# Patient Record
Sex: Female | Born: 1970
Health system: Southern US, Community
[De-identification: ages and names within clinical notes are randomized; demographics above are authoritative.]

## PROBLEM LIST (undated history)

## (undated) DIAGNOSIS — Z8042 Family history of malignant neoplasm of prostate: Secondary | ICD-10-CM

## (undated) DIAGNOSIS — C50919 Malignant neoplasm of unspecified site of unspecified female breast: Secondary | ICD-10-CM

## (undated) DIAGNOSIS — J189 Pneumonia, unspecified organism: Secondary | ICD-10-CM

## (undated) DIAGNOSIS — Z803 Family history of malignant neoplasm of breast: Secondary | ICD-10-CM

## (undated) DIAGNOSIS — K219 Gastro-esophageal reflux disease without esophagitis: Secondary | ICD-10-CM

## (undated) DIAGNOSIS — I10 Essential (primary) hypertension: Secondary | ICD-10-CM

## (undated) DIAGNOSIS — T7840XA Allergy, unspecified, initial encounter: Secondary | ICD-10-CM

## (undated) DIAGNOSIS — I509 Heart failure, unspecified: Secondary | ICD-10-CM

## (undated) DIAGNOSIS — Z923 Personal history of irradiation: Secondary | ICD-10-CM

## (undated) HISTORY — PX: ESSURE TUBAL LIGATION: SUR464

## (undated) HISTORY — PX: CHOLECYSTECTOMY: SHX55

## (undated) HISTORY — DX: Malignant neoplasm of unspecified site of unspecified female breast: C50.919

## (undated) HISTORY — PX: WISDOM TOOTH EXTRACTION: SHX21

## (undated) HISTORY — DX: Family history of malignant neoplasm of prostate: Z80.42

## (undated) HISTORY — DX: Family history of malignant neoplasm of breast: Z80.3

## (undated) HISTORY — DX: Essential (primary) hypertension: I10

## (undated) HISTORY — DX: Allergy, unspecified, initial encounter: T78.40XA

## (undated) HISTORY — PX: BREAST SURGERY: SHX581

---

## 1998-04-09 ENCOUNTER — Emergency Department (HOSPITAL_COMMUNITY): Admission: EM | Admit: 1998-04-09 | Discharge: 1998-04-09 | Payer: Self-pay | Admitting: Emergency Medicine

## 2008-06-05 ENCOUNTER — Emergency Department (HOSPITAL_BASED_OUTPATIENT_CLINIC_OR_DEPARTMENT_OTHER): Admission: EM | Admit: 2008-06-05 | Discharge: 2008-06-05 | Payer: Self-pay | Admitting: Emergency Medicine

## 2011-07-03 ENCOUNTER — Emergency Department (INDEPENDENT_AMBULATORY_CARE_PROVIDER_SITE_OTHER): Payer: No Typology Code available for payment source

## 2011-07-03 ENCOUNTER — Emergency Department (HOSPITAL_BASED_OUTPATIENT_CLINIC_OR_DEPARTMENT_OTHER)
Admission: EM | Admit: 2011-07-03 | Discharge: 2011-07-03 | Disposition: A | Discharge status: Home / Self Care | Payer: No Typology Code available for payment source | Attending: Emergency Medicine | Admitting: Emergency Medicine

## 2011-07-03 ENCOUNTER — Encounter: Payer: Self-pay | Admitting: *Deleted

## 2011-07-03 DIAGNOSIS — S8000XA Contusion of unspecified knee, initial encounter: Secondary | ICD-10-CM | POA: Insufficient documentation

## 2011-07-03 DIAGNOSIS — IMO0001 Reserved for inherently not codable concepts without codable children: Secondary | ICD-10-CM | POA: Insufficient documentation

## 2011-07-03 DIAGNOSIS — S8002XA Contusion of left knee, initial encounter: Secondary | ICD-10-CM

## 2011-07-03 DIAGNOSIS — M25569 Pain in unspecified knee: Secondary | ICD-10-CM

## 2011-07-03 DIAGNOSIS — M4120 Other idiopathic scoliosis, site unspecified: Secondary | ICD-10-CM

## 2011-07-03 DIAGNOSIS — S39012A Strain of muscle, fascia and tendon of lower back, initial encounter: Secondary | ICD-10-CM

## 2011-07-03 DIAGNOSIS — S335XXA Sprain of ligaments of lumbar spine, initial encounter: Secondary | ICD-10-CM | POA: Insufficient documentation

## 2011-07-03 DIAGNOSIS — M545 Low back pain: Secondary | ICD-10-CM

## 2011-07-03 DIAGNOSIS — Y92410 Unspecified street and highway as the place of occurrence of the external cause: Secondary | ICD-10-CM | POA: Insufficient documentation

## 2011-07-03 MED ORDER — METHOCARBAMOL 500 MG PO TABS: 500.0000 mg | ORAL_TABLET | Freq: Two times a day (BID) | ORAL | Status: AC

## 2011-07-03 MED ORDER — KETOROLAC TROMETHAMINE 60 MG/2ML IM SOLN
60.0000 mg | Freq: Once | INTRAMUSCULAR | Status: AC
  Administered 2011-07-03: 60 mg via INTRAMUSCULAR
  Filled 2011-07-03: qty 2

## 2011-07-03 MED ORDER — IBUPROFEN 800 MG PO TABS: 800.0000 mg | ORAL_TABLET | Freq: Three times a day (TID) | ORAL | Status: AC

## 2011-07-03 MED ORDER — HYDROCODONE-ACETAMINOPHEN 5-500 MG PO TABS: 2.0000 | ORAL_TABLET | Freq: Four times a day (QID) | ORAL | Status: AC | PRN
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
## 2011-07-03 NOTE — ED Provider Notes (Signed)
Medical screening examination/treatment/procedure(s) were performed by non-physician practitioner and as supervising physician I was immediately available for consultation/collaboration.   Gussie Towson M Nil Bolser, MD 07/03/11 2312 
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
## 2014-07-06 ENCOUNTER — Other Ambulatory Visit: Payer: Self-pay | Admitting: Radiology

## 2014-07-06 DIAGNOSIS — C50911 Malignant neoplasm of unspecified site of right female breast: Secondary | ICD-10-CM

## 2014-07-07 ENCOUNTER — Telehealth: Payer: Self-pay | Admitting: *Deleted

## 2014-07-07 DIAGNOSIS — C50311 Malignant neoplasm of lower-inner quadrant of right female breast: Secondary | ICD-10-CM

## 2014-07-07 NOTE — Telephone Encounter (Signed)
Confirmed [facility] for 07/14/14 at 1230pm .  Instructions and contact information given.

## 2014-07-13 NOTE — Telephone Encounter (Signed)
Error

## 2014-07-14 ENCOUNTER — Other Ambulatory Visit (INDEPENDENT_AMBULATORY_CARE_PROVIDER_SITE_OTHER): Payer: Self-pay | Admitting: General Surgery

## 2014-07-14 ENCOUNTER — Encounter: Payer: Self-pay | Admitting: Hematology

## 2014-07-14 ENCOUNTER — Ambulatory Visit: Payer: Self-pay

## 2014-07-14 ENCOUNTER — Other Ambulatory Visit: Payer: Self-pay | Admitting: *Deleted

## 2014-07-14 ENCOUNTER — Other Ambulatory Visit (HOSPITAL_BASED_OUTPATIENT_CLINIC_OR_DEPARTMENT_OTHER): Payer: BC Managed Care – PPO

## 2014-07-14 ENCOUNTER — Encounter (INDEPENDENT_AMBULATORY_CARE_PROVIDER_SITE_OTHER): Payer: Self-pay

## 2014-07-14 ENCOUNTER — Ambulatory Visit: Payer: BC Managed Care – PPO | Attending: General Surgery | Admitting: Physical Therapy

## 2014-07-14 ENCOUNTER — Encounter: Payer: Self-pay | Admitting: General Practice

## 2014-07-14 ENCOUNTER — Ambulatory Visit (HOSPITAL_BASED_OUTPATIENT_CLINIC_OR_DEPARTMENT_OTHER): Payer: BC Managed Care – PPO | Admitting: Hematology

## 2014-07-14 ENCOUNTER — Ambulatory Visit
Admission: RE | Admit: 2014-07-14 | Discharge: 2014-07-14 | Disposition: A | Discharge status: Home / Self Care | Payer: No Typology Code available for payment source | Source: Ambulatory Visit | Attending: Radiation Oncology | Admitting: Radiation Oncology

## 2014-07-14 VITALS — BP 158/84 | HR 80 | Temp 98.9°F | Resp 18 | Ht 70.0 in | Wt 215.9 lb

## 2014-07-14 DIAGNOSIS — C773 Secondary and unspecified malignant neoplasm of axilla and upper limb lymph nodes: Secondary | ICD-10-CM

## 2014-07-14 DIAGNOSIS — C50311 Malignant neoplasm of lower-inner quadrant of right female breast: Secondary | ICD-10-CM

## 2014-07-14 DIAGNOSIS — Z171 Estrogen receptor negative status [ER-]: Secondary | ICD-10-CM

## 2014-07-14 DIAGNOSIS — C50911 Malignant neoplasm of unspecified site of right female breast: Secondary | ICD-10-CM

## 2014-07-14 LAB — COMPREHENSIVE METABOLIC PANEL (CC13)
ALBUMIN: 3.8 g/dL (ref 3.5–5.0)
ALK PHOS: 79 U/L (ref 40–150)
ALT: 21 U/L (ref 0–55)
ANION GAP: 10 meq/L (ref 3–11)
AST: 23 U/L (ref 5–34)
BILIRUBIN TOTAL: 0.36 mg/dL (ref 0.20–1.20)
BUN: 10.9 mg/dL (ref 7.0–26.0)
CO2: 26 mEq/L (ref 22–29)
CREATININE: 0.9 mg/dL (ref 0.6–1.1)
Calcium: 9.9 mg/dL (ref 8.4–10.4)
Chloride: 103 mEq/L (ref 98–109)
GLUCOSE: 73 mg/dL (ref 70–140)
POTASSIUM: 4 meq/L (ref 3.5–5.1)
SODIUM: 139 meq/L (ref 136–145)
TOTAL PROTEIN: 8.4 g/dL — AB (ref 6.4–8.3)

## 2014-07-14 LAB — CBC WITH DIFFERENTIAL/PLATELET
BASO%: 0.2 % (ref 0.0–2.0)
BASOS ABS: 0 10*3/uL (ref 0.0–0.1)
EOS%: 0.9 % (ref 0.0–7.0)
Eosinophils Absolute: 0.1 10*3/uL (ref 0.0–0.5)
HEMATOCRIT: 40.8 % (ref 34.8–46.6)
HGB: 13.5 g/dL (ref 11.6–15.9)
LYMPH#: 3.1 10*3/uL (ref 0.9–3.3)
LYMPH%: 30.2 % (ref 14.0–49.7)
MCH: 28.7 pg (ref 25.1–34.0)
MCHC: 33.1 g/dL (ref 31.5–36.0)
MCV: 86.6 fL (ref 79.5–101.0)
MONO#: 0.8 10*3/uL (ref 0.1–0.9)
MONO%: 8.2 % (ref 0.0–14.0)
NEUT%: 60.5 % (ref 38.4–76.8)
NEUTROS ABS: 6.2 10*3/uL (ref 1.5–6.5)
Platelets: 263 10*3/uL (ref 145–400)
RBC: 4.71 10*6/uL (ref 3.70–5.45)
RDW: 12.9 % (ref 11.2–14.5)
WBC: 10.2 10*3/uL (ref 3.9–10.3)

## 2014-07-14 NOTE — Progress Notes (Signed)
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 female who is seen today at the [facility] through the courtesy of Dr. Barry Dienes for evaluation of her clinical stage T3 N2 MX invasive ductal carcinoma the right breast.  This past March she noted posterior shoulder discomfort.  This did not entirely go away.  2 months ago she noted a right axillary mass, and then soon after a right breast mass.  2 weeks ago she had a right shoulder x-ray at an imaging center.  There was a rounded density along the right lung concerning for malignancy.  It is unclear if this was possibly a nodal mass projected over the right lung apex.  She was seen at [facility] on 07/02/2014 where she underwent mammography which noted a similar mass at 4:00 within the right breast and also a 1.8 cm in largest node within the right axillary tail.  Ultrasound showed at least a 5 cm mass at the 4:00 position within the right breast and also a 5 cm nodal mass within the right axillary tail.  Ultrasound-guided biopsies on 07/05/2014 were diagnostic for invasive ductal carcinoma/DCIS from the right breast mass and also from a right axillary node/mass biopsy.  I understand that the tumor was ER/PR negative and HER-2/neu positive (verbal report).  Ki-67 is 80%.  She is scheduled for a MRI scan of her breasts this Friday.  She does have limited range of motion of her right shoulder.  She describes pain radiating to the right deltoid but not distal.  She seen today with Dr. Burr Medico and Dr. Stark Klein.  PREVIOUS RADIATION THERAPY: No   PAST MEDICAL HISTORY:  has a past  medical history of Breast cancer and Anxiety.     PAST SURGICAL HISTORY:  Past Surgical History  Procedure Laterality Date  . Cesarean section    . Essure tubal ligation    . Cholecystectomy       FAMILY HISTORY: family history includes Breast cancer in her maternal aunt and mother; Liver cancer in her father and paternal grandmother; Prostate cancer in her maternal grandfather and paternal grandfather. Her mother was diagnosed with breast cancer at age 4 and a maternal aunt was diagnosed at age 22.  Both the breast cancer survivors.  Her mother is currently 75.  Her father died from "liver cancer" in his 47s.   SOCIAL HISTORY:  reports that she has never smoked. She does not have any smokeless tobacco history on file. She reports that she drinks alcohol. She reports that she does not use illicit drugs. Married, 2 children, daughter age 38 and son age 10.   ALLERGIES: Aspirin and Codeine   MEDICATIONS:  Current Outpatient Prescriptions  Medication Sig Dispense Refill  . Multiple Vitamin (MULTIVITAMIN) tablet Take 1 tablet by mouth daily.       No current facility-administered medications for this encounter.     REVIEW OF SYSTEMS:  Pertinent items are noted in HPI.    PHYSICAL EXAM: Alert and oriented 43 year old black female appearing her stated age. Wt Readings from Last  3 Encounters:  07/14/14 215 lb 14.4 oz (97.932 kg)  07/03/11 190 lb (86.183 kg)   Temp Readings from Last 3 Encounters:  07/14/14 98.9 F (37.2 C) Oral  07/03/11 98.8 F (37.1 C) Oral   BP Readings from Last 3 Encounters:  07/14/14 158/84  07/03/11 154/85   Pulse Readings from Last 3 Encounters:  07/14/14 80  07/03/11 77   Head and neck examination: Grossly unremarkable.  Nodes: There is no palpable cervical or supraclavicular lymphadenopathy.  There is a 5 cm partially fixed mass within the right axilla.  Chest: Lungs clear.  Breasts: There is a mobile 5 cm mass at 4:00 along the lower inner  quadrant of the right breast.  Left breast without masses or lesions.  Extremities: Without edema.  There is limited right shoulder extension secondary to pain.    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  His she will have a MRI scan this Friday and then a PET scan to complete her staging workup.  I'm obviously concerned about the possibility of stage IV disease based on her right shoulder x-ray history.  She may have direct extension into the brachial plexus from axillary adenopathy.  I wonder if the mass seen on her x-ray is adenopathy superimposed over the upper right lung.  If she has only local regional disease then she will receive neoadjuvant chemotherapy followed by surgery and then radiation therapy with curative intent..  We talked in very general terms about the role of surgery and radiation therapy in the setting of local regional disease versus metastatic disease where chemotherapy plays the prominent role.  She will also undergo genetic counseling/testing.  Dr. Feng will initiate chemotherapy in the very near future.   PLAN: As discussed above.  I spent 30 minutes minutes face to face with the patient and more than 50% of that time was spent in counseling and/or coordination of care.          

## 2014-07-14 NOTE — Progress Notes (Signed)
[name] and her husband briefly to provide [name] resources prior to their rapid departure to pick up their 45 year old son.  The patient scored a 6 on the Psychosocial Distress Thermometer which indicates moderate distress.  ONCBCN DISTRESS SCREENING 07/14/2014  Screening Type Initial Screening  Distress experienced in past week (1-10) 6  Practical problem type Insurance  Family Problem type Children  Emotional problem type Nervousness/Anxiety  Referral to clinical social work Yes  Referral to support programs Yes  Other chaplain met pt and husband, providing print resources, but did not have detailed visit    On written distress screen, [name] [name] notes that children are her top concern.    Follow up needed: Yes.   Plan to follow up by phone to assess for further emotional/spiritual needs, particularly considering plan for PET scan/further investigation.  Please also page as needs arise:  [phone].  Thank you.  [place], [place]

## 2014-07-14 NOTE — Progress Notes (Signed)
Checked in new patient with no issues. She has appt card and has not been traveling. She has [facility] packet and I advised of alight fund.

## 2014-07-14 NOTE — Progress Notes (Signed)
[facility] NOTE  Patient Care Team: [name], DO as PCP - General (Family Medicine) [name], DO (Family Medicine) [name], NP as Nurse Practitioner (Nurse Practitioner) Stark Klein, MD as Consulting Physician (General Surgery) [name], MD as Consulting Physician (Radiation Oncology) [name], MD as Consulting Physician (Hematology)  CHIEF COMPLAINTS/PURPOSE OF CONSULTATION:  Newly diagnosed right breast cancer  HISTORY OF PRESENTING ILLNESS:  [name] [name] 43 y.o. female presents to our breast multidisciplinary clinic today to discuss the management of her newly diagnosed breast cancer.  She felt a lump in her right breast and another lump in her right axilla about 2 months. She denies any pain, skin change or nipple discharge. She denies any trauma to her breast or chest. She initially did not pay much attention, and the mass has not been changed much in the past 2 months. She finally called her primary care physician about 2 weeks ago. She underwent bilateral diagnostic mammogram on 07/02/2014, which showed a 2 cm mobile mass in the right breast at 4:00 position and a 1.8 cm enlarged lymph nodes in the right axilla tail. She also had an right shoulder x-ray which showed large wound density in the right lung. 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 The tumor was ER negative, PR negative, HER-2 positive.  She reports mild fatigue in the past few months, partially related to the st her grandmother's terminal illness. She also notices low appettie and early satity and eats litter less, lost 4 lbs in the past 2 weeks. (+) cough from sinitis, and some right shoulder discomfort without limitation.  MEDICAL HISTORY:  Past Medical History  Diagnosis Date  . Breast  cancer   . Anxiety     SURGICAL HISTORY: Past Surgical History  Procedure Laterality Date  . Cesarean section    . Essure tubal ligation    . Cholecystectomy      SOCIAL HISTORY: History   Social History  . Marital Status: Married    Spouse Name: N/A    Number of Children:  she has 2 daughters at the age of 64 and 49.   . Years of Education: N/A   Occupational History  .  works as Freight forwarder for a [employer].    Social History Main Topics  . Smoking status: Never Smoker   . Smokeless tobacco: Not on file  . Alcohol Use: Yes  . Drug Use: No  . Sexual Activity: No    FAMILY HISTORY: Family History  Problem Relation Age of Onset  . Breast cancer Mother 75  . Liver cancer Father 106  . Breast cancer Maternal Aunt 36  . Prostate cancer Maternal Grandfather   . Liver cancer Paternal Grandmother   . Prostate cancer Paternal Grandfather       ALLERGIES:  is allergic to aspirin and codeine.  MEDICATIONS:  Current Outpatient Prescriptions  Medication Sig Dispense Refill  . Multiple Vitamin (MULTIVITAMIN) tablet Take 1 tablet by mouth daily.       No current facility-administered medications for this visit.    REVIEW OF SYSTEMS:   Constitutional: Denies fevers, chills or abnormal night sweats, (+) malaise  Eyes: Denies blurriness of vision, double vision or watery eyes Ears, nose, mouth, throat, and face: Denies mucositis or sore throat Respiratory: (+) dry cough, no dyspnea  or wheezes Cardiovascular: Denies palpitation, chest discomfort or lower extremity swelling Gastrointestinal:  Denies nausea, heartburn or change in bowel habits Skin: Denies abnormal skin rashes Lymphatics: Denies new lymphadenopathy or easy bruising Neurological:Denies numbness, tingling or new weaknesses Behavioral/Psych: Mood is stable, no new changes  All other systems were reviewed with the patient and are negative.  PHYSICAL EXAMINATION: ECOG PERFORMANCE STATUS: 0 -  Asymptomatic  Filed Vitals:   07/14/14 1306  BP: 158/84  Pulse: 80  Temp: 98.9 F (37.2 C)  Resp: 18   Filed Weights   07/14/14 1306  Weight: 215 lb 14.4 oz (97.932 kg)    GENERAL:alert, no distress and comfortable SKIN: skin color, texture, turgor are normal, no rashes or significant lesions EYES: normal, conjunctiva are pink and non-injected, sclera clear OROPHARYNX:no exudate, no erythema and lips, buccal mucosa, and tongue normal  NECK: supple, thyroid normal size, non-tender, without nodularity LYMPH:  no palpable lymphadenopathy in the cervical, axillary or inguinal LUNGS: clear to auscultation and percussion with normal breathing effort HEART: regular rate & rhythm and no murmurs and no lower extremity edema ABDOMEN:abdomen soft, non-tender and normal bowel sounds Musculoskeletal:no cyanosis of digits and no clubbing  PSYCH: alert & oriented x 3 with fluent speech NEURO: no focal motor/sensory deficits Breasts: Breast inspection showed them to be symmetrical with no skin change or nipple discharge. Palpation of the right breast showed a 5 x 7 cm firm mass in the left or in her lower quadrant and a 3 x 5 cm firm node in right axilla. Left breast  and axilla revealed no obvious mass that I could appreciate.   LABORATORY DATA:  I have reviewed the data as listed Lab Results  Component Value Date   WBC 10.2 07/14/2014   HGB 13.5 07/14/2014   HCT 40.8 07/14/2014   MCV 86.6 07/14/2014   PLT 263 07/14/2014    Recent Labs  07/14/14 1251  NA 139  K 4.0  CO2 26  GLUCOSE 73  BUN 10.9  CREATININE 0.9  CALCIUM 9.9  PROT 8.4*  ALBUMIN 3.8  AST 23  ALT 21  ALKPHOS 79  BILITOT 0.36   PATHOLOGY REPORT 07/09/2014 #1 breast, right needle core biopsy more anterior -Invasive ductal carcinoma -Ductal carcinoma in situ #2 breast, right needle core biopsy, posterior -Invasive ductal carcinoma #3 lymph node, needle core biopsy, axillary -Ductal carcinoma  ER negative,  PR negative, HER-2 positive (Copy number: [id number], ration 6. 45)  RADIOGRAPHIC STUDIES: I have personally reviewed the radiological images as listed and agreed with the findings in the report. See HPI   ASSESSMENT & PLAN:  43 year old African-American female, premenopausal, without significant past medical history, presented with palpable right breast mass and right axillary mass. Although her initial mammogram, ultrasound reported right breast mass measuring about 2 cm, her imagings were reviewed in the breast tumor conference his morning and the radiologist felt the normal enhancing area is about 5 cm in the right breast 4:00, and a physical exam also revealed a large and firm breast mass. She likely has T2/3 N1 disease.   Her right shoulder x-ray also reviewed a large lesion in the right lung. Unfortunately the image was not available for me to review. I'm not certain if this is a true lesion in the lung, or the shuttle from her right breast mass. Regardless she has locally advanced HER-2 positive disease, at least stage IIb, I would recommend a PET/CT scan to ruled out distant metastasis and also further evaluated her  lymph nodes involvement. If her PET scan reviewed suspicious lung lesion or other distant metastasis, I would recommend further biopsy to confirm metastasis.   If she does not have distant metastasis, we would recommend multidisciplinary approach to treat her locally advanced HER-2 positive breast cancer. I would favor neoadjuvant chemotherapy with TCHP (docetaxel, carboplatin, trastuzumab, and pertuzumab). I will ask Dr. Harley Alto to put a mediport in next week. I was also obtain an echocardiogram to evaluate her baseline cardiac function.  She and her husband had multiple questions, and I just all to their satisfaction. Patient is anxious to start treatment.  Due to her young age and positive family history, we will refer her to see a genetic counselor at our cancer center. If she was  BRCA positive, she would likely need bilateral mastectomy if she has no metastatic disease.  Plan: -PET/CT scan -Echocardiogram -Mediport placement next Wednesday -Genetic counseling referral -I'll see her back next Thursday or Friday to review the PET scan findings and finalize her chemotherapy  treatment plan  All questions were answered. The patient knows to call the clinic with any problems, questions or concerns. I spent 40 minutes counseling the patient face to face. The total time spent in the appointment was 60 minutes and more than 50% was on counseling.     [name], MD 07/14/2014 2:32 PM

## 2014-07-14 NOTE — Therapy (Signed)
[facility] [address] [place], [place], [address] Phone: [phone]   Fax:  [phone]  Physical Therapy Evaluation  Patient Details  Name: [name] [name] MRN: [id number] Date of Birth: Feb 04, 1971  Encounter Date: 07/14/2014      PT End of Session - 07/14/14 1505    Visit Number 1   Number of Visits 1   PT Start Time 1320   PT Stop Time 1350   PT Time Calculation (min) 30 min   Activity Tolerance Patient tolerated treatment well   Behavior During Therapy [facility] for tasks assessed/performed      Past Medical History  Diagnosis Date  . Breast cancer   . Anxiety     Past Surgical History  Procedure Laterality Date  . Cesarean section    . Essure tubal ligation    . Cholecystectomy      There were no vitals taken for this visit.  Visit Diagnosis:  Carcinoma of lower-inner quadrant of right breast - Plan: PT plan of care cert/re-cert      Subjective Assessment - 07/14/14 1435    Symptoms Patient is being seen today in the breast multidisciplinary clinic for a new diagnosis of right breast cancer.   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  There is an area of concern in one of her lungs and she will be scheduled for a PET scan.   Patient Stated Goals Reduce lymphedema risk; learn shoulder ROM exercises   Currently in Pain? Yes   Pain Score 4    Pain Location Arm   Pain Orientation Right   Pain Descriptors / Indicators --  nerve type pain   Pain Type Neuropathic pain   Pain Radiating Towards down right arm into hand   Pain Onset More than a month ago  Since March 2015   Pain Frequency Constant   Aggravating Factors  using her computer / work   Pain Relieving Factors rest   Multiple Pain Sites No          [facility] PT Assessment - 07/14/14 0001    Assessment   Medical Diagnosis right breast cancer   Onset Date 07/02/14   Precautions    Precautions Other (comment)  active cancer   Balance Screen   Has the patient fallen in the past 6 months No   Has the patient had a decrease in activity level because of a fear of falling?  No   Is the patient reluctant to leave their home because of a fear of falling?  No   Home Environment   Living Enviornment Private residence   Living Arrangements Spouse/significant other;Children  34 and 72 year old kids   Available Help at Discharge Family   Prior Function   Level of Independence Independent with basic ADLs   Vocation Full time employment  Aeronautical engineer   Vocation Requirements On computer 80% of day and meeting with employees rest of time   Leisure She does not exercise   Cognition   Overall Cognitive Status Within Functional Limits for tasks assessed   Posture/Postural Control   Posture/Postural Control No significant limitations   AROM   Right Shoulder Extension 31 Degrees   Right Shoulder Flexion 120 Degrees   Right Shoulder ABduction 122 Degrees   Right Shoulder Internal Rotation 45 Degrees   Right Shoulder External Rotation 63 Degrees   Left Shoulder Extension 61 Degrees   Left Shoulder Flexion 148  Degrees   Left Shoulder ABduction 144 Degrees   Left Shoulder Internal Rotation 68 Degrees   Left Shoulder External Rotation 82 Degrees   Strength   Overall Strength Within functional limits for tasks performed            PT Education - 07/14/14 1502    Education provided Yes   Education Details Post op shoulder ROM home exercises; lymphedema risk reduction practices   Person(s) Educated Patient   Methods Explanation;Demonstration;Verbal cues   Comprehension Verbalized understanding;Returned demonstration              Plan - 07/14/14 1505    Clinical Impression Statement Patient was seen today for baselines assessments for her new diagnosis of right breast cancer.  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  She will undergo neoadjuvant chemotherapy and then surgery will be determined.  If she undergoes breast surgery, she will likely have an axillary node dissection.   Pt will benefit from skilled therapeutic intervention in order to improve on the following deficits Decreased range of motion;Increased edema;Decreased knowledge of precautions;Pain;Impaired UE functional use   Rehab Potential Good   Clinical Impairments Affecting Rehab Potential none   PT Frequency One time visit   PT Treatment/Interventions Therapeutic exercise;Patient/family education   Consulted and Agree with Plan of Care Patient;Family member/caregiver   Family Member Consulted husband              LYMPHEDEMA/ONCOLOGY QUESTIONNAIRE - 07/14/14 1459    Type   Cancer Type right breast cancer   Lymphedema Assessments   Lymphedema Assessments Upper extremities   Right Upper Extremity Lymphedema   10 cm Proximal to Olecranon Process 31.9 cm   Olecranon Process 28.3 cm   10 cm Proximal to Ulnar Styloid Process 24.7 cm   Just Proximal to Ulnar Styloid Process 18.6 cm   Across Hand at [facility] 20.2 cm   At [place] of 2nd Digit 6.6 cm   Left Upper Extremity Lymphedema   10 cm Proximal to Olecranon Process 32 cm   Olecranon Process 27.7 cm   10 cm Proximal to Ulnar Styloid Process 24 cm   Just Proximal to Ulnar Styloid Process 18.3 cm   Across Hand at [facility] 19.2 cm   At [place] of 2nd Digit 6.3 cm                     Breast Clinic Goals - 07/14/14 1509    Patient will be able to verbalize understanding of pertinent lymphedema risk reduction practices relevant to her diagnosis specifically related to skin care.   Time 1   Period Days   Status Achieved   Patient will be able to return demonstrate and/or verbalize understanding of the post-op home exercise program related to regaining shoulder range of motion.   Time 1   Period Days   Status Achieved    Patient will be able to verbalize understanding of the importance of attending the postoperative After Breast Cancer Class for further lymphedema risk reduction education and therapeutic exercise.   Time 1   Period Days   Status Achieved           Problem List Patient Active Problem List   Diagnosis Date Noted  . Breast cancer of lower-inner quadrant of right female breast 07/07/2014    Jerre Vandrunen,MARTI COOPER, PT 07/14/2014, 3:14 PM

## 2014-07-14 NOTE — Patient Instructions (Signed)

## 2014-07-15 ENCOUNTER — Encounter (HOSPITAL_COMMUNITY)
Admission: RE | Admit: 2014-07-15 | Discharge: 2014-07-15 | Disposition: A | Discharge status: Home / Self Care | Payer: BC Managed Care – PPO | Source: Ambulatory Visit | Attending: General Surgery | Admitting: General Surgery

## 2014-07-15 ENCOUNTER — Encounter (HOSPITAL_COMMUNITY): Payer: Self-pay

## 2014-07-15 ENCOUNTER — Telehealth: Payer: Self-pay | Admitting: Hematology

## 2014-07-15 DIAGNOSIS — Z01812 Encounter for preprocedural laboratory examination: Secondary | ICD-10-CM | POA: Insufficient documentation

## 2014-07-15 HISTORY — DX: Gastro-esophageal reflux disease without esophagitis: K21.9

## 2014-07-15 HISTORY — DX: Pneumonia, unspecified organism: J18.9

## 2014-07-15 LAB — PREGNANCY, URINE: PREG TEST UR: NEGATIVE

## 2014-07-15 NOTE — Progress Notes (Signed)
Patient has sinus drainage which she states has improved.  Dr Barry Dienes saw patient in office on 07/14/14 and is aware.  I instructed patient to notify surgeon if becomes worse.  Patient is aware to do coughing and deep breathing exercises along with increased fluid intake.

## 2014-07-15 NOTE — Telephone Encounter (Signed)
Confirm appt for Dec.

## 2014-07-15 NOTE — Patient Instructions (Addendum)
[name] [name]  07/15/2014   Your procedure is scheduled on:  07/21/2014    Come thru the [place] Entrance  Follow the Signs to [place] at 0630       am  Call this number if you have problems the morning of surgery: [phone]   Remember:   Do not eat food or drink liquids after midnight.   Take these medicines the morning of surgery with A SIP OF WATER: Albuterol Inhaler if needed and bring with you, Zyrtec if needed    Do not wear jewelry, make-up or nail polish.  Do not wear lotions, powders, or perfumes.  deodorant.  Do not shave 48 hours prior to surgery.   Do not bring valuables to the hospital.  Contacts, dentures or bridgework may not be worn into surgery.       Patients discharged the day of surgery will not be allowed to drive  home.  Name and phone number of your driver:      Please read over the following fact sheets that you were given:  coughing and deep breathing exercises, leg exercises            [place] - Preparing for Surgery Before surgery, you can play an important role.  Because skin is not sterile, your skin needs to be as free of germs as possible.  You can reduce the number of germs on your skin by washing with CHG (chlorahexidine gluconate) soap before surgery.  CHG is an antiseptic cleaner which kills germs and bonds with the skin to continue killing germs even after washing. Please DO NOT use if you have an allergy to CHG or antibacterial soaps.  If your skin becomes reddened/irritated stop using the CHG and inform your nurse when you arrive at Short Stay. Do not shave (including legs and underarms) for at least 48 hours prior to the first CHG shower.  You may shave your face/neck. Please follow these instructions carefully:  1.  Shower with CHG Soap the night before surgery and the  morning of Surgery.  2.  If you choose to wash your hair, wash your hair first as usual with your  normal  shampoo.  3.  After you shampoo, rinse your hair  and body thoroughly to remove the  shampoo.                           4.  Use CHG as you would any other liquid soap.  You can apply chg directly  to the skin and wash                       Gently with a scrungie or clean washcloth.  5.  Apply the CHG Soap to your body ONLY FROM THE NECK DOWN.   Do not use on face/ open                           Wound or open sores. Avoid contact with eyes, ears mouth and genitals (private parts).                       Wash face,  Genitals (private parts) with your normal soap.             6.  Wash thoroughly, paying special attention to the area where your surgery  will be performed.  7.  Thoroughly rinse your body with warm water from the neck down.  8.  DO NOT shower/wash with your normal soap after using and rinsing off  the CHG Soap.                9.  Pat yourself dry with a clean towel.            10.  Wear clean pajamas.            11.  Place clean sheets on your bed the night of your first shower and do not  sleep with pets. Day of Surgery : Do not apply any lotions/deodorants the morning of surgery.  Please wear clean clothes to the hospital/surgery center.  FAILURE TO FOLLOW THESE INSTRUCTIONS MAY RESULT IN THE CANCELLATION OF YOUR SURGERY PATIENT SIGNATURE_________________________________  NURSE SIGNATURE__________________________________  ________________________________________________________________________

## 2014-07-15 NOTE — Progress Notes (Signed)
CBC/DIFF/CMP- done  07/14/2014 in [facility].

## 2014-07-16 ENCOUNTER — Ambulatory Visit
Admission: RE | Admit: 2014-07-16 | Discharge: 2014-07-16 | Disposition: A | Discharge status: Home / Self Care | Payer: BC Managed Care – PPO | Source: Ambulatory Visit | Attending: Radiology | Admitting: Radiology

## 2014-07-16 DIAGNOSIS — C50911 Malignant neoplasm of unspecified site of right female breast: Secondary | ICD-10-CM

## 2014-07-16 MED ORDER — GADOBENATE DIMEGLUMINE 529 MG/ML IV SOLN
20.0000 mL | Freq: Once | INTRAVENOUS | Status: AC | PRN
  Administered 2014-07-16: 20 mL via INTRAVENOUS

## 2014-07-19 ENCOUNTER — Telehealth: Payer: Self-pay | Admitting: *Deleted

## 2014-07-19 ENCOUNTER — Other Ambulatory Visit: Payer: Self-pay | Admitting: *Deleted

## 2014-07-19 NOTE — Telephone Encounter (Signed)
Received request for orders for PET to be faxed to [phone].  This was done @ 3 pm.

## 2014-07-20 ENCOUNTER — Other Ambulatory Visit: Payer: BC Managed Care – PPO

## 2014-07-20 ENCOUNTER — Telehealth: Payer: Self-pay | Admitting: *Deleted

## 2014-07-20 ENCOUNTER — Other Ambulatory Visit: Payer: Self-pay | Admitting: *Deleted

## 2014-07-20 ENCOUNTER — Encounter (HOSPITAL_COMMUNITY): Payer: Self-pay

## 2014-07-20 ENCOUNTER — Ambulatory Visit (HOSPITAL_COMMUNITY)
Admission: RE | Admit: 2014-07-20 | Discharge: 2014-07-20 | Disposition: A | Discharge status: Home / Self Care | Payer: BC Managed Care – PPO | Source: Ambulatory Visit | Attending: Hematology | Admitting: Hematology

## 2014-07-20 ENCOUNTER — Ambulatory Visit: Payer: Self-pay | Admitting: Hematology

## 2014-07-20 ENCOUNTER — Other Ambulatory Visit (HOSPITAL_COMMUNITY): Payer: BC Managed Care – PPO

## 2014-07-20 DIAGNOSIS — C50919 Malignant neoplasm of unspecified site of unspecified female breast: Secondary | ICD-10-CM | POA: Insufficient documentation

## 2014-07-20 DIAGNOSIS — C50311 Malignant neoplasm of lower-inner quadrant of right female breast: Secondary | ICD-10-CM

## 2014-07-20 DIAGNOSIS — Z01818 Encounter for other preprocedural examination: Secondary | ICD-10-CM | POA: Insufficient documentation

## 2014-07-20 DIAGNOSIS — I369 Nonrheumatic tricuspid valve disorder, unspecified: Secondary | ICD-10-CM

## 2014-07-20 MED ORDER — IOHEXOL 300 MG/ML  SOLN
100.0000 mL | Freq: Once | INTRAMUSCULAR | Status: AC | PRN
  Administered 2014-07-20: 100 mL via INTRAVENOUS

## 2014-07-20 NOTE — Telephone Encounter (Signed)
Received call from patient this am stating she was just leaving [place] and was unable to complete her PET scan due to anxiety.  She is scheduled for her echocardiogram here and I informed her I would discuss with Dr. Burr Medico and let her know what we needed to do next.  Per Dr. Burr Medico patient can get a CT c/a/p with IV contrast only.  Was able to meet with patient after her echo and her and her husband had a lot of questions about PET vs CT.  I was able to get her an appointment for the CT today at 1pm. They also requested that her appointment be moved up with Dr. Burr Medico to 07/22/14.  Fortunately Dr. Burr Medico was able to accomodate this and her appointment was changed to 07/22/14 at 4pm.

## 2014-07-21 ENCOUNTER — Encounter (HOSPITAL_COMMUNITY)
Admission: RE | Disposition: A | Discharge status: Home / Self Care | Payer: Self-pay | Source: Ambulatory Visit | Attending: General Surgery

## 2014-07-21 ENCOUNTER — Ambulatory Visit (HOSPITAL_COMMUNITY): Payer: BC Managed Care – PPO

## 2014-07-21 ENCOUNTER — Encounter (HOSPITAL_COMMUNITY): Payer: Self-pay | Admitting: *Deleted

## 2014-07-21 ENCOUNTER — Ambulatory Visit (HOSPITAL_COMMUNITY): Payer: BC Managed Care – PPO | Admitting: Anesthesiology

## 2014-07-21 ENCOUNTER — Ambulatory Visit (HOSPITAL_COMMUNITY)
Admission: RE | Admit: 2014-07-21 | Discharge: 2014-07-21 | Disposition: A | Discharge status: Home / Self Care | Payer: BC Managed Care – PPO | Source: Ambulatory Visit | Attending: General Surgery | Admitting: General Surgery

## 2014-07-21 DIAGNOSIS — K219 Gastro-esophageal reflux disease without esophagitis: Secondary | ICD-10-CM | POA: Insufficient documentation

## 2014-07-21 DIAGNOSIS — Z95828 Presence of other vascular implants and grafts: Secondary | ICD-10-CM

## 2014-07-21 DIAGNOSIS — C7800 Secondary malignant neoplasm of unspecified lung: Secondary | ICD-10-CM | POA: Insufficient documentation

## 2014-07-21 DIAGNOSIS — Z8042 Family history of malignant neoplasm of prostate: Secondary | ICD-10-CM | POA: Diagnosis not present

## 2014-07-21 DIAGNOSIS — C50911 Malignant neoplasm of unspecified site of right female breast: Secondary | ICD-10-CM | POA: Insufficient documentation

## 2014-07-21 DIAGNOSIS — Z800 Family history of malignant neoplasm of digestive organs: Secondary | ICD-10-CM | POA: Diagnosis not present

## 2014-07-21 DIAGNOSIS — Z803 Family history of malignant neoplasm of breast: Secondary | ICD-10-CM | POA: Insufficient documentation

## 2014-07-21 HISTORY — PX: PORTACATH PLACEMENT: SHX2246

## 2014-07-21 SURGERY — INSERTION, TUNNELED CENTRAL VENOUS DEVICE, WITH PORT
Anesthesia: General | Laterality: Left

## 2014-07-21 MED ORDER — PROPOFOL 10 MG/ML IV BOLUS
INTRAVENOUS | Status: AC
  Filled 2014-07-21: qty 20

## 2014-07-21 MED ORDER — ONDANSETRON HCL 4 MG/2ML IJ SOLN
INTRAMUSCULAR | Status: DC | PRN
  Administered 2014-07-21: 4 mg via INTRAVENOUS

## 2014-07-21 MED ORDER — LACTATED RINGERS IV SOLN: INTRAVENOUS | Status: DC

## 2014-07-21 MED ORDER — DEXAMETHASONE SODIUM PHOSPHATE 10 MG/ML IJ SOLN
INTRAMUSCULAR | Status: DC | PRN
  Administered 2014-07-21: 10 mg via INTRAVENOUS

## 2014-07-21 MED ORDER — ACETAMINOPHEN 325 MG PO TABS: 650.0000 mg | ORAL_TABLET | ORAL | Status: DC | PRN

## 2014-07-21 MED ORDER — HEPARIN SOD (PORK) LOCK FLUSH 100 UNIT/ML IV SOLN
INTRAVENOUS | Status: AC
  Filled 2014-07-21: qty 5

## 2014-07-21 MED ORDER — LIDOCAINE HCL 1 % IJ SOLN
INTRAMUSCULAR | Status: DC | PRN
  Administered 2014-07-21: 10 mL

## 2014-07-21 MED ORDER — OXYCODONE HCL 5 MG PO TABS: 5.0000 mg | ORAL_TABLET | ORAL | Status: DC | PRN

## 2014-07-21 MED ORDER — METOCLOPRAMIDE HCL 5 MG/ML IJ SOLN
INTRAMUSCULAR | Status: DC | PRN
  Administered 2014-07-21: 10 mg via INTRAVENOUS

## 2014-07-21 MED ORDER — SODIUM CHLORIDE 0.9 % IJ SOLN: 3.0000 mL | INTRAMUSCULAR | Status: DC | PRN

## 2014-07-21 MED ORDER — ONDANSETRON HCL 4 MG/2ML IJ SOLN
INTRAMUSCULAR | Status: AC
  Filled 2014-07-21: qty 2

## 2014-07-21 MED ORDER — FENTANYL CITRATE 0.05 MG/ML IJ SOLN
25.0000 ug | INTRAMUSCULAR | Status: DC | PRN
  Administered 2014-07-21: 50 ug via INTRAVENOUS

## 2014-07-21 MED ORDER — BUPIVACAINE-EPINEPHRINE 0.25% -1:200000 IJ SOLN
INTRAMUSCULAR | Status: DC | PRN
  Administered 2014-07-21: 10 mL

## 2014-07-21 MED ORDER — FENTANYL CITRATE 0.05 MG/ML IJ SOLN
INTRAMUSCULAR | Status: AC
  Filled 2014-07-21: qty 2

## 2014-07-21 MED ORDER — LIDOCAINE HCL 1 % IJ SOLN
INTRAMUSCULAR | Status: AC
  Filled 2014-07-21: qty 20

## 2014-07-21 MED ORDER — MIDAZOLAM HCL 5 MG/5ML IJ SOLN
INTRAMUSCULAR | Status: DC | PRN
  Administered 2014-07-21: 2 mg via INTRAVENOUS

## 2014-07-21 MED ORDER — SODIUM CHLORIDE 0.9 % IR SOLN
Freq: Once | Status: DC
  Filled 2014-07-21: qty 1.2

## 2014-07-21 MED ORDER — FENTANYL CITRATE 0.05 MG/ML IJ SOLN
INTRAMUSCULAR | Status: DC | PRN
  Administered 2014-07-21: 50 ug via INTRAVENOUS

## 2014-07-21 MED ORDER — SODIUM CHLORIDE 0.9 % IV SOLN: 250.0000 mL | INTRAVENOUS | Status: DC | PRN

## 2014-07-21 MED ORDER — PROPOFOL 10 MG/ML IV BOLUS
INTRAVENOUS | Status: DC | PRN
  Administered 2014-07-21: 200 mg via INTRAVENOUS

## 2014-07-21 MED ORDER — BUPIVACAINE-EPINEPHRINE (PF) 0.25% -1:200000 IJ SOLN
INTRAMUSCULAR | Status: AC
  Filled 2014-07-21: qty 30

## 2014-07-21 MED ORDER — OXYCODONE-ACETAMINOPHEN 5-325 MG PO TABS: 1.0000 | ORAL_TABLET | ORAL | Status: DC | PRN

## 2014-07-21 MED ORDER — METOCLOPRAMIDE HCL 5 MG/ML IJ SOLN
INTRAMUSCULAR | Status: AC
  Filled 2014-07-21: qty 2

## 2014-07-21 MED ORDER — CEFAZOLIN SODIUM-DEXTROSE 2-3 GM-% IV SOLR
INTRAVENOUS | Status: AC
  Filled 2014-07-21: qty 50

## 2014-07-21 MED ORDER — MIDAZOLAM HCL 2 MG/2ML IJ SOLN
INTRAMUSCULAR | Status: AC
  Filled 2014-07-21: qty 2

## 2014-07-21 MED ORDER — CEFAZOLIN SODIUM-DEXTROSE 2-3 GM-% IV SOLR
2.0000 g | INTRAVENOUS | Status: AC
  Administered 2014-07-21: 2 g via INTRAVENOUS

## 2014-07-21 MED ORDER — HEPARIN (PORCINE) IN NACL 2-0.9 UNIT/ML-% IJ SOLN: INTRAMUSCULAR | Status: DC

## 2014-07-21 MED ORDER — LACTATED RINGERS IV SOLN
INTRAVENOUS | Status: DC | PRN
  Administered 2014-07-21: 08:00:00 via INTRAVENOUS

## 2014-07-21 MED ORDER — SODIUM CHLORIDE 0.9 % IJ SOLN: 3.0000 mL | Freq: Two times a day (BID) | INTRAMUSCULAR | Status: DC

## 2014-07-21 MED ORDER — PROMETHAZINE HCL 25 MG/ML IJ SOLN: 6.2500 mg | INTRAMUSCULAR | Status: DC | PRN

## 2014-07-21 MED ORDER — HEPARIN SOD (PORK) LOCK FLUSH 100 UNIT/ML IV SOLN
INTRAVENOUS | Status: DC | PRN
  Administered 2014-07-21: 500 [IU] via INTRAVENOUS

## 2014-07-21 MED ORDER — DEXAMETHASONE SODIUM PHOSPHATE 10 MG/ML IJ SOLN
INTRAMUSCULAR | Status: AC
  Filled 2014-07-21: qty 1

## 2014-07-21 MED ORDER — ACETAMINOPHEN 650 MG RE SUPP: 650.0000 mg | RECTAL | Status: DC | PRN

## 2014-07-21 SURGICAL SUPPLY — 35 items
ADH SKN CLS APL DERMABOND .7 (GAUZE/BANDAGES/DRESSINGS) ×1
BAG DECANTER FOR FLEXI CONT (MISCELLANEOUS) ×3 IMPLANT
BLADE HEX COATED 2.75 (ELECTRODE) ×3 IMPLANT
BLADE SURG 15 STRL LF DISP TIS (BLADE) ×1 IMPLANT
BLADE SURG 15 STRL SS (BLADE) ×3
BLADE SURG SZ11 CARB STEEL (BLADE) ×3 IMPLANT
CHLORAPREP W/TINT 26ML (MISCELLANEOUS) ×3 IMPLANT
DECANTER SPIKE VIAL GLASS SM (MISCELLANEOUS) ×3 IMPLANT
DERMABOND ADVANCED (GAUZE/BANDAGES/DRESSINGS) ×2
DERMABOND ADVANCED .7 DNX12 (GAUZE/BANDAGES/DRESSINGS) IMPLANT
DRAPE C-ARM 42X120 X-RAY (DRAPES) ×3 IMPLANT
DRAPE LAPAROTOMY TRNSV 102X78 (DRAPE) ×3 IMPLANT
DRAPE UTILITY XL STRL (DRAPES) ×3 IMPLANT
ELECT REM PT RETURN 9FT ADLT (ELECTROSURGICAL) ×3
ELECTRODE REM PT RTRN 9FT ADLT (ELECTROSURGICAL) ×1 IMPLANT
GAUZE SPONGE 4X4 16PLY XRAY LF (GAUZE/BANDAGES/DRESSINGS) ×3 IMPLANT
GLOVE BIO SURGEON STRL SZ 6 (GLOVE) ×3 IMPLANT
GLOVE INDICATOR 6.5 STRL GRN (GLOVE) ×3 IMPLANT
GOWN STRL REUS W/TWL 2XL LVL3 (GOWN DISPOSABLE) ×3 IMPLANT
GOWN STRL REUS W/TWL XL LVL3 (GOWN DISPOSABLE) ×3 IMPLANT
KIT BASIN OR (CUSTOM PROCEDURE TRAY) ×3 IMPLANT
KIT PORT POWER 8FR ISP CVUE (Catheter) ×2 IMPLANT
LIQUID BAND (GAUZE/BANDAGES/DRESSINGS) ×3 IMPLANT
NEEDLE HYPO 22GX1.5 SAFETY (NEEDLE) ×3 IMPLANT
PACK BASIC VI WITH GOWN DISP (CUSTOM PROCEDURE TRAY) ×3 IMPLANT
PENCIL BUTTON HOLSTER BLD 10FT (ELECTRODE) ×3 IMPLANT
SUT MNCRL AB 4-0 PS2 18 (SUTURE) ×3 IMPLANT
SUT PROLENE 2 0 SH DA (SUTURE) ×6 IMPLANT
SUT VIC AB 3-0 SH 27 (SUTURE) ×3
SUT VIC AB 3-0 SH 27X BRD (SUTURE) ×1 IMPLANT
SYR CONTROL 10ML LL (SYRINGE) ×3 IMPLANT
SYRINGE 10CC LL (SYRINGE) ×3 IMPLANT
TOWEL OR 17X26 10 PK STRL BLUE (TOWEL DISPOSABLE) ×3 IMPLANT
TOWEL OR NON WOVEN STRL DISP B (DISPOSABLE) ×3 IMPLANT
YANKAUER SUCT BULB TIP 10FT TU (MISCELLANEOUS) IMPLANT

## 2014-07-21 NOTE — Transfer of Care (Signed)
Immediate Anesthesia Transfer of Care Note  Patient: [name] [name]  Procedure(s) Performed: Procedure(s): INSERTION PORT-A-CATH (Left)  Patient Location: PACU  Anesthesia Type:General  Level of Consciousness: awake, sedated and patient cooperative  Airway & Oxygen Therapy: Patient Spontanous Breathing and Patient connected to face mask oxygen  Post-op Assessment: Report given to PACU RN and Post -op Vital signs reviewed and stable  Post vital signs: Reviewed and stable  Complications: No apparent anesthesia complications

## 2014-07-21 NOTE — Discharge Instructions (Signed)
[facility] °Office Phone Number [phone] ° ° POST OP INSTRUCTIONS ° °Always review your discharge instruction sheet given to you by the facility where your surgery was performed. ° °IF YOU HAVE DISABILITY OR FAMILY LEAVE FORMS, YOU MUST BRING THEM TO THE OFFICE FOR PROCESSING.  DO NOT GIVE THEM TO YOUR DOCTOR. ° °1. A prescription for pain medication may be given to you upon discharge.  Take your pain medication as prescribed, if needed.  If narcotic pain medicine is not needed, then you may take acetaminophen (Tylenol) or ibuprofen (Advil) as needed. °2. Take your usually prescribed medications unless otherwise directed °3. If you need a refill on your pain medication, please contact your pharmacy.  They will contact our office to request authorization.  Prescriptions will not be filled after 5pm or on week-ends. °4. You should eat very light the first 24 hours after surgery, such as soup, crackers, pudding, etc.  Resume your normal diet the day after surgery °5. It is common to experience some constipation if taking pain medication after surgery.  Increasing fluid intake and taking a stool softener will usually help or prevent this problem from occurring.  A mild laxative (Milk of Magnesia or Miralax) should be taken according to package directions if there are no bowel movements after 48 hours. °6. You may shower in 48 hours.  The surgical glue will flake off in 2-3 weeks.   °7. ACTIVITIES:  No strenuous activity or heavy lifting for 1 week.   °a. You may drive when you no longer are taking prescription pain medication, you can comfortably wear a seatbelt, and you can safely maneuver your car and apply brakes. °b. RETURN TO WORK:  __________to be determined._______________ °You should see your doctor in the office for a follow-up appointment approximately three-four weeks after your surgery.   ° °WHEN TO CALL YOUR DOCTOR: °1. Fever over 101.0 °2. Nausea and/or vomiting. °3. Extreme swelling  or bruising. °4. Continued bleeding from incision. °5. Increased pain, redness, or drainage from the incision. ° °The clinic staff is available to answer your questions during regular business hours.  Please don’t hesitate to call and ask to speak to one of the nurses for clinical concerns.  If you have a medical emergency, go to the nearest emergency room or call 911.  A surgeon from [facility] is always on call at the hospital. ° °For further questions, please visit [email or web address]  ° °

## 2014-07-21 NOTE — Anesthesia Preprocedure Evaluation (Signed)
Anesthesia Evaluation  Patient identified by MRN, date of birth, ID band Patient awake    Reviewed: Allergy & Precautions, H&P , NPO status , Patient's Chart, lab work & pertinent test results  Airway Mallampati: II  TM Distance: >3 FB Neck ROM: Full    Dental no notable dental hx.    Pulmonary neg pulmonary ROS,  breath sounds clear to auscultation  Pulmonary exam normal       Cardiovascular negative cardio ROS  Rhythm:Regular Rate:Normal     Neuro/Psych negative neurological ROS  negative psych ROS   GI/Hepatic negative GI ROS, Neg liver ROS,   Endo/Other  negative endocrine ROS  Renal/GU negative Renal ROS  negative genitourinary   Musculoskeletal negative musculoskeletal ROS (+)   Abdominal   Peds negative pediatric ROS (+)  Hematology negative hematology ROS (+)   Anesthesia Other Findings   Reproductive/Obstetrics negative OB ROS                             Anesthesia Physical Anesthesia Plan  ASA: II  Anesthesia Plan: General   Post-op Pain Management:    Induction: Intravenous  Airway Management Planned: LMA  Additional Equipment:   Intra-op Plan:   Post-operative Plan: Extubation in OR  Informed Consent: I have reviewed the patients History and Physical, chart, labs and discussed the procedure including the risks, benefits and alternatives for the proposed anesthesia with the patient or authorized representative who has indicated his/her understanding and acceptance.   Dental advisory given  Plan Discussed with: CRNA and Surgeon  Anesthesia Plan Comments:         Anesthesia Quick Evaluation

## 2014-07-21 NOTE — Op Note (Signed)
PREOPERATIVE DIAGNOSIS:  Right breast cancer cT2N2     POSTOPERATIVE DIAGNOSIS:  Same     PROCEDURE: Left subclavian port placement, Bard ClearVue  Power Port, MRI safe, 8-French.      SURGEON:  Stark Klein, MD      ANESTHESIA:  General   FINDINGS:  Good venous return, easy flush, and tip of the catheter and   SVC 25 cm.      SPECIMEN:  None.      ESTIMATED BLOOD LOSS:  Minimal.      COMPLICATIONS:  None known.      PROCEDURE:  Pt was identified in the holding area and taken to   the operating room, where patient was placed supine on the operating room   table.  General anesthesia was induced.  Patient's arms were tucked and the upper   chest and neck were prepped and draped in sterile fashion.  Time-out was   performed according to the surgical safety check list.  When all was   correct, we continued.   Local anesthetic was administered over this   area at the angle of the clavicle.  The vein was accessed with 2 passes of the needle. There was good venous return and the wire passed easily with no ectopy.   Fluoroscopy was used to confirm that the wire was in the vena cava.      The patient was placed back level and the area for the pocket was anethetized   with local anesthetic.  A 3-cm transverse incision was made with a #15   blade.  Cautery was used to divide the subcutaneous tissues down to the   pectoralis muscle.  An [employer] retractor was used to elevate the skin   while a pocket was created on top of the pectoralis fascia.  The port   was placed into the pocket to confirm that it was of adequate size.  The   catheter was preattached to the port.  The port was then secured to the   pectoralis fascia with four 2-0 Prolene sutures.  These were clamped and   not tied down yet.    The catheter was tunneled through to the wire exit   site.  The catheter was placed along the wire to determine what length it should be to be in the SVC.  The catheter was cut at 25 cm.  The  tunneler sheath and dilator were passed over the wire and the dilator and wire were removed.  The catheter was advanced through the tunneler sheath and the tunneler sheath was pulled away.  Care was taken to keep the catheter in the tunneler sheath as this occurred. This was advanced and the tunneler sheath was removed.  There was good venous   return and easy flush of the catheter.  The Prolene sutures were tied   down to the pectoral fascia.  The skin was reapproximated using 3-0   Vicryl interrupted deep dermal sutures.    Fluoroscopy was used to re-confirm good position of the catheter.  The skin   was then closed using 4-0 Monocryl in a subcuticular fashion.  The port was flushed with concentrated heparin flush as well.  The wounds were then cleaned, dried, and dressed with Dermabond.  The patient was awakened from anesthesia and taken to the PACU in stable condition.  Needle, sponge, and instrument counts were correct.               Stark Klein, MD

## 2014-07-21 NOTE — Anesthesia Postprocedure Evaluation (Signed)
  Anesthesia Post-op Note  Patient: [name] [name]  Procedure(s) Performed: Procedure(s) (LRB): INSERTION PORT-A-CATH (Left)  Patient Location: PACU  Anesthesia Type: General  Level of Consciousness: awake and alert   Airway and Oxygen Therapy: Patient Spontanous Breathing  Post-op Pain: mild  Post-op Assessment: Post-op Vital signs reviewed, Patient's Cardiovascular Status Stable, Respiratory Function Stable, Patent Airway and No signs of Nausea or vomiting  Last Vitals:  Filed Vitals:   07/21/14 0930  BP:   Pulse:   Temp: 36.6 C  Resp: 20    Post-op Vital Signs: stable   Complications: No apparent anesthesia complications

## 2014-07-21 NOTE — H&P (Signed)
[name] [name] is an 43 y.o. female.   Chief Complaint: Right breast cancer HPI: Pt presents with a large palpable mass in the right breast and axilla.  She is having right neck pain.    Past Medical History  Diagnosis Date  . Breast cancer   . Pneumonia     hx of pneumonia 08/2013   . GERD (gastroesophageal reflux disease)     during pregnancy     Past Surgical History  Procedure Laterality Date  . Cesarean section    . Essure tubal ligation    . Cholecystectomy    . Wisdom tooth extraction      Family History  Problem Relation Age of Onset  . Breast cancer Mother   . Liver cancer Father   . Breast cancer Maternal Aunt   . Prostate cancer Maternal Grandfather   . Liver cancer Paternal Grandmother   . Prostate cancer Paternal Grandfather    Social History:  reports that she has never smoked. She has never used smokeless tobacco. She reports that she drinks alcohol. She reports that she does not use illicit drugs.  Allergies:  Allergies  Allergen Reactions  . Aspirin     Upsets stomach  . Codeine     Makes loopy     Medications Prior to Admission  Medication Sig Dispense Refill  . albuterol (PROVENTIL HFA;VENTOLIN HFA) 108 (90 BASE) MCG/ACT inhaler Inhale 2 puffs into the lungs every 4 (four) hours as needed for wheezing or shortness of breath.    . Camphor-Eucalyptus-Menthol (VICKS VAPORUB EX) Apply 1 application topically at bedtime as needed (sinus). Applies under nose, throat and on chest.    . cetirizine (ZYRTEC) 10 MG tablet Take 10 mg by mouth daily as needed for allergies.     Marland Kitchen ibuprofen (ADVIL,MOTRIN) 200 MG tablet Take 400-800 mg by mouth every 6 (six) hours as needed for headache or moderate pain.    . metaxalone (SKELAXIN) 800 MG tablet Take 800 mg by mouth 3 (three) times daily as needed for muscle spasms.    . Neomycin-Bacitracin-Polymyxin (TRIPLE ANTIBIOTIC EX) Apply 1 application topically 2 (two) times daily as needed (eczema on hands.).    Marland Kitchen  Pseudoeph-Doxylamine-DM-APAP (NYQUIL PO) Take 30 mLs by mouth 2 (two) times daily as needed (cold/allergies).       No results found for this or any previous visit (from the past 48 hour(s)). Ct Chest W Contrast  07/20/2014   CLINICAL DATA:  43 year old new breast cancer staging, questionable lung mass  EXAM: CT CHEST, ABDOMEN, AND PELVIS WITH CONTRAST  TECHNIQUE: Multidetector CT imaging of the chest, abdomen and pelvis was performed following the standard protocol during bolus administration of intravenous contrast.  CONTRAST:  142mL OMNIPAQUE IOHEXOL 300 MG/ML  SOLN  COMPARISON:  None.  FINDINGS: CT CHEST FINDINGS  Breasts: In the lower inner quadrant of the right breast there is a 2.5 x 4.2 x 2.8 cm mass, likely correspond to patient's known malignancy. Just superior to this mass there is a 1.8 x 1.1 cm mass. Additional subcentimeter soft tissue masses are seen adjacent to the primary mass and may represent satellite lesions.  Chest: An approximately 4.7 x 4.2 x 4.2 cm mass is noted in the left upper lobe. A 2.1 x 2.1 x 1.5 cm right middle lobe pulmonary nodule is present. 6 mm and 4 mm pulmonary nodules are present within the right lower lobe. A questionable 5 mm pulmonary pulmonary nodule is noted at the right lung base (  series 4, image 43). 8 mm and 4 mm left lower lobe pulmonary nodules are also present.  There is no focal airspace consolidation, pleural effusion or pneumothorax.  Heart/mediastinum: The heart size is normal. There is no pericardial effusion.  Enlarged right axillary lymph nodes are present the largest measures approximately 4.3 x 2.2 cm. A 1.1 cm left hilar lymph node is present.  CT ABDOMEN AND PELVIS FINDINGS  Liver: The liver is mildly low in attenuation, compatible with hepatic steatosis. No suspicious hepatic mass is seen.  Gallbladder: The gallbladder is surgically absent. Surgical clips are noted within the gallbladder fossa.  Spleen: Unremarkable.  Pancreas: Unremarkable.   Adrenal glands: Unremarkable.  Kidneys: A 3 mm nonobstructing stone is noted in the lower pole of the right kidney. The kidneys are otherwise unremarkable.  Bowel/gastrointestinal tract: There is no evidence for bowel obstruction. No abnormal bowel wall thickening is identified.  Pelvis: Linear metallic densities are noted in the region of the fallopian tubes and may represent a contraceptive devices. There is no pelvic mass. The urinary bladder is distended.  Miscellaneous: There is no intra-abdominal or retroperitoneal adenopathy. There is no free air or free fluid.  Osseous structures: Thoracolumbar scoliosis is present. No suspicious osseous lesion is identified.  IMPRESSION: 1. Right breast mass is compatible with known malignancy. Adjacent soft tissue masses may represent satellite lesions. Recommend correlation with mammography, ultrasound or MRI. 2. Left upper lobe, right middle lobe and bilateral lower lobe pulmonary masses/nodules are concerning for metastatic disease. 3. Right axillary and left hilar adenopathy are concerning for metastatic disease. 4. 3 mm nonobstructing right renal stone.   Electronically Signed   By: Rosemarie Ax   On: 07/20/2014 17:05   Ct Abdomen Pelvis W Contrast  07/20/2014   CLINICAL DATA:  43 year old new breast cancer staging, questionable lung mass  EXAM: CT CHEST, ABDOMEN, AND PELVIS WITH CONTRAST  TECHNIQUE: Multidetector CT imaging of the chest, abdomen and pelvis was performed following the standard protocol during bolus administration of intravenous contrast.  CONTRAST:  174mL OMNIPAQUE IOHEXOL 300 MG/ML  SOLN  COMPARISON:  None.  FINDINGS: CT CHEST FINDINGS  Breasts: In the lower inner quadrant of the right breast there is a 2.5 x 4.2 x 2.8 cm mass, likely correspond to patient's known malignancy. Just superior to this mass there is a 1.8 x 1.1 cm mass. Additional subcentimeter soft tissue masses are seen adjacent to the primary mass and may represent satellite  lesions.  Chest: An approximately 4.7 x 4.2 x 4.2 cm mass is noted in the left upper lobe. A 2.1 x 2.1 x 1.5 cm right middle lobe pulmonary nodule is present. 6 mm and 4 mm pulmonary nodules are present within the right lower lobe. A questionable 5 mm pulmonary pulmonary nodule is noted at the right lung base (series 4, image 43). 8 mm and 4 mm left lower lobe pulmonary nodules are also present.  There is no focal airspace consolidation, pleural effusion or pneumothorax.  Heart/mediastinum: The heart size is normal. There is no pericardial effusion.  Enlarged right axillary lymph nodes are present the largest measures approximately 4.3 x 2.2 cm. A 1.1 cm left hilar lymph node is present.  CT ABDOMEN AND PELVIS FINDINGS  Liver: The liver is mildly low in attenuation, compatible with hepatic steatosis. No suspicious hepatic mass is seen.  Gallbladder: The gallbladder is surgically absent. Surgical clips are noted within the gallbladder fossa.  Spleen: Unremarkable.  Pancreas: Unremarkable.  Adrenal glands: Unremarkable.  Kidneys: A 3 mm nonobstructing stone is noted in the lower pole of the right kidney. The kidneys are otherwise unremarkable.  Bowel/gastrointestinal tract: There is no evidence for bowel obstruction. No abnormal bowel wall thickening is identified.  Pelvis: Linear metallic densities are noted in the region of the fallopian tubes and may represent a contraceptive devices. There is no pelvic mass. The urinary bladder is distended.  Miscellaneous: There is no intra-abdominal or retroperitoneal adenopathy. There is no free air or free fluid.  Osseous structures: Thoracolumbar scoliosis is present. No suspicious osseous lesion is identified.  IMPRESSION: 1. Right breast mass is compatible with known malignancy. Adjacent soft tissue masses may represent satellite lesions. Recommend correlation with mammography, ultrasound or MRI. 2. Left upper lobe, right middle lobe and bilateral lower lobe pulmonary  masses/nodules are concerning for metastatic disease. 3. Right axillary and left hilar adenopathy are concerning for metastatic disease. 4. 3 mm nonobstructing right renal stone.   Electronically Signed   By: Rosemarie Ax   On: 07/20/2014 17:05    Review of Systems  Musculoskeletal: Positive for neck pain.  Neurological: Positive for tingling.  All other systems reviewed and are negative.   Blood pressure 140/80, pulse 80, temperature 98.2 F (36.8 C), temperature source Oral, resp. rate 18, SpO2 97 %. Physical Exam  Constitutional: She is oriented to person, place, and time. She appears well-developed and well-nourished. No distress.  HENT:  Head: Normocephalic and atraumatic.  Eyes: Conjunctivae are normal. Pupils are equal, round, and reactive to light. No scleral icterus.  Cardiovascular: Normal rate.   Respiratory: Effort normal. No respiratory distress.    Large palpable mass in the lower inner quadrant, large   GI: Soft.  Musculoskeletal:  Restricted ROM on right arm  Neurological: She is alert and oriented to person, place, and time.  Skin: Skin is warm and dry. She is not diaphoretic.  Psychiatric: She has a normal mood and affect. Her behavior is normal. Judgment and thought content normal.     Assessment/Plan Right breast cancer Plan port a cath  Discussed risks and benefits with patient.  Abygale Karpf 07/21/2014, 6:55 AM

## 2014-07-22 ENCOUNTER — Encounter: Payer: Self-pay | Admitting: Hematology

## 2014-07-22 ENCOUNTER — Ambulatory Visit: Payer: BC Managed Care – PPO | Admitting: Genetic Counselor

## 2014-07-22 ENCOUNTER — Encounter: Payer: Self-pay | Admitting: *Deleted

## 2014-07-22 ENCOUNTER — Encounter: Payer: Self-pay | Admitting: Cardiothoracic Surgery

## 2014-07-22 ENCOUNTER — Ambulatory Visit (INDEPENDENT_AMBULATORY_CARE_PROVIDER_SITE_OTHER): Payer: BC Managed Care – PPO | Admitting: Cardiothoracic Surgery

## 2014-07-22 ENCOUNTER — Ambulatory Visit (HOSPITAL_BASED_OUTPATIENT_CLINIC_OR_DEPARTMENT_OTHER): Payer: BC Managed Care – PPO | Admitting: Hematology

## 2014-07-22 ENCOUNTER — Other Ambulatory Visit: Payer: BC Managed Care – PPO

## 2014-07-22 ENCOUNTER — Other Ambulatory Visit: Payer: Self-pay | Admitting: *Deleted

## 2014-07-22 VITALS — BP 140/76 | HR 72 | Temp 98.6°F | Resp 19 | Ht 70.0 in | Wt 220.3 lb

## 2014-07-22 DIAGNOSIS — C50311 Malignant neoplasm of lower-inner quadrant of right female breast: Secondary | ICD-10-CM

## 2014-07-22 DIAGNOSIS — R918 Other nonspecific abnormal finding of lung field: Secondary | ICD-10-CM | POA: Insufficient documentation

## 2014-07-22 DIAGNOSIS — C7800 Secondary malignant neoplasm of unspecified lung: Secondary | ICD-10-CM

## 2014-07-22 DIAGNOSIS — R911 Solitary pulmonary nodule: Secondary | ICD-10-CM

## 2014-07-22 DIAGNOSIS — C773 Secondary and unspecified malignant neoplasm of axilla and upper limb lymph nodes: Secondary | ICD-10-CM

## 2014-07-22 DIAGNOSIS — C50919 Malignant neoplasm of unspecified site of unspecified female breast: Secondary | ICD-10-CM | POA: Insufficient documentation

## 2014-07-22 DIAGNOSIS — C50911 Malignant neoplasm of unspecified site of right female breast: Secondary | ICD-10-CM

## 2014-07-22 DIAGNOSIS — Z803 Family history of malignant neoplasm of breast: Secondary | ICD-10-CM

## 2014-07-22 NOTE — Progress Notes (Signed)
No date for treatment as of today. °

## 2014-07-22 NOTE — Progress Notes (Signed)
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 MRI breast on 07/16/2014 showed 7cm R breast lesion and 4.4cm r axillary node    07/02/2014 Imaging CT CAP: a 4.7cm mass in LUL lung and a 2.1cm mas in RML, and a small nodule in RUL, suspecious for metastasis     07/09/2014 Initial Diagnosis right IDA with b/l lung lesions, ER-/PR-/HER2+   07/09/2014 Initial Biopsy US guided right breast mass and axillary node biopsy showed IDA, and DCIS, ER-/PR-/HER2+    INTERVAL HISTROY   [name] [name] returns for follow-up. She was not able to complete the PET scan due to claustrophobia. So she underwent a CT chest abdomen and pelvis for staging 2 days ago. She also got up Mediport per day and yesterday. Breast MRI was also done last week.  She feels about same, quite anxious to get treatment started.  MEDICAL HISTORY:  Past Medical History  Diagnosis Date  . Breast cancer   . Pneumonia     hx of pneumonia 08/2013   . GERD (gastroesophageal reflux disease)     during pregnancy     SURGICAL HISTORY: Past Surgical History  Procedure Laterality Date  . Cesarean section    . Essure tubal ligation    . Cholecystectomy    . Wisdom tooth extraction    . Portacath placement Left 07/21/2014    Procedure: INSERTION PORT-A-CATH;  Surgeon: Stark Klein, MD;  Location: [facility];  Service: General;  Laterality: Left;    SOCIAL HISTORY: History   Social History  . Marital Status: Married    Spouse Name: N/A    Number of Children:  she has 2 daughters at the age of 78 and 66.   . Years of Education: N/A   Occupational History  .  works as Freight forwarder for a [employer].    Social History Main Topics  . Smoking status: Never Smoker   . Smokeless tobacco: Not on file  . Alcohol Use: Yes  . Drug Use: No  . Sexual Activity: No    FAMILY HISTORY: Family History  Problem Relation Age of Onset  . Breast cancer Mother 27  . Liver cancer Father 80  . Breast cancer Maternal Aunt 36  . Prostate cancer Maternal Grandfather   . Liver cancer Paternal Grandmother   . Prostate cancer Paternal Grandfather       ALLERGIES:  is allergic to aspirin and codeine.  MEDICATIONS:  Current Outpatient Prescriptions  Medication Sig Dispense Refill  . albuterol (PROVENTIL HFA;VENTOLIN HFA) 108 (90 BASE) MCG/ACT inhaler Inhale 2 puffs into the lungs every 4 (four) hours as needed for wheezing or shortness of breath.    . Camphor-Eucalyptus-Menthol (VICKS VAPORUB EX) Apply 1 application topically at bedtime as needed (sinus). Applies under nose, throat and on chest.    . cetirizine (ZYRTEC) 10 MG tablet Take 10 mg  by mouth daily as needed for allergies.     . metaxalone (SKELAXIN) 800 MG tablet Take 800 mg by mouth 3 (three) times daily as needed for muscle spasms.    . Neomycin-Bacitracin-Polymyxin (TRIPLE ANTIBIOTIC EX) Apply 1 application topically 2 (two) times daily as needed (eczema on hands.).    Marland Kitchen oxyCODONE-acetaminophen (ROXICET) 5-325 MG per tablet Take 1-2 tablets by mouth every 4 (four) hours as needed for severe pain. 30 tablet 0  . Pseudoeph-Doxylamine-DM-APAP (NYQUIL PO) Take 30 mLs by mouth 2 (two) times daily as needed (cold/allergies).     Marland Kitchen ibuprofen (ADVIL,MOTRIN) 200 MG tablet Take 400-800 mg by mouth every 6 (six) hours as  needed for headache or moderate pain.    Marland Kitchen ibuprofen (ADVIL,MOTRIN) 800 MG tablet Take 800 mg by mouth as needed.  0   No current facility-administered medications for this visit.    REVIEW OF SYSTEMS:   Constitutional: Denies fevers, chills or abnormal night sweats, (+) malaise  Eyes: Denies blurriness of vision, double vision or watery eyes Ears, nose, mouth, throat, and face: Denies mucositis or sore throat Respiratory: (+) dry cough, no dyspnea or wheezes Cardiovascular: Denies palpitation, chest discomfort or lower extremity swelling Gastrointestinal:  Denies nausea, heartburn or change in bowel habits Skin: Denies abnormal skin rashes Lymphatics: Denies new lymphadenopathy or easy bruising Neurological:Denies numbness, tingling or new weaknesses Behavioral/Psych: Mood is stable, no new changes  All other systems were reviewed with the patient and are negative.  PHYSICAL EXAMINATION: ECOG PERFORMANCE STATUS: 0 - Asymptomatic  Filed Vitals:   07/22/14 1601  BP: 140/76  Pulse: 72  Temp: 98.6 F (37 C)  Resp: 19   Filed Weights   07/22/14 1601  Weight: 220 lb 4.8 oz (99.927 kg)    GENERAL:alert, no distress and comfortable SKIN: skin color, texture, turgor are normal, no rashes or significant lesions EYES: normal, conjunctiva are pink and non-injected, sclera clear OROPHARYNX:no exudate, no erythema and lips, buccal mucosa, and tongue normal  NECK: supple, thyroid normal size, non-tender, without nodularity LYMPH:  no palpable lymphadenopathy in the cervical, axillary or inguinal LUNGS: clear to auscultation and percussion with normal breathing effort HEART: regular rate & rhythm and no murmurs and no lower extremity edema ABDOMEN:abdomen soft, non-tender and normal bowel sounds Musculoskeletal:no cyanosis of digits and no clubbing  PSYCH: alert & oriented x 3 with fluent speech NEURO: no focal motor/sensory deficits Breasts: Breast inspection showed them to be  symmetrical with no skin change or nipple discharge. Palpation of the right breast showed a 5 x 7 cm firm mass in the left or in her lower quadrant and a 3 x 5 cm firm node in right axilla. Left breast  and axilla revealed no obvious mass that I could appreciate.   LABORATORY DATA:  I have reviewed the data as listed Lab Results  Component Value Date   WBC 10.2 07/14/2014   HGB 13.5 07/14/2014   HCT 40.8 07/14/2014   MCV 86.6 07/14/2014   PLT 263 07/14/2014    Recent Labs  07/14/14 1251  NA 139  K 4.0  CO2 26  GLUCOSE 73  BUN 10.9  CREATININE 0.9  CALCIUM 9.9  PROT 8.4*  ALBUMIN 3.8  AST 23  ALT 21  ALKPHOS 79  BILITOT 0.36   PATHOLOGY REPORT 07/09/2014 #1 breast, right needle core biopsy more anterior -Invasive ductal carcinoma -Ductal carcinoma in situ #2 breast, right needle core biopsy, posterior -Invasive ductal carcinoma #3 lymph node, needle core biopsy,  axillary -Ductal carcinoma  ER negative, PR negative, HER-2 positive (Copy number: [id number], ration 6. 45)  RADIOGRAPHIC STUDIES: CT CAP 07/20/2014 IMPRESSION: 1. Right breast mass is compatible with known malignancy. Adjacent soft tissue masses may represent satellite lesions. Recommend correlation with mammography, ultrasound or MRI. 2. Left upper lobe, right middle lobe and bilateral lower lobe pulmonary masses/nodules are concerning for metastatic disease. 3. Right axillary and left hilar adenopathy are concerning for metastatic disease. 4. 3 mm nonobstructing right renal stone.   ECHO 12/8 Impressions: - Normal biventricular size and systolic function. Impaired relaxation with normal filling pressures. Mild tricupsid regurgitation with normal RVSP.  ASSESSMENT & PLAN:  43 year old African-American female, premenopausal, without significant past medical history, presented with palpable right breast mass and right axillary mass.   1. R breast IDA, T3N1M1, stage IV, ER-/PR-/HER2 -I reviewed her  CT chest abdomen and pelvis with her and her family members. She has a large left upper lobe central mass, a 2.1 cm right middle lobe mass and a small right lower lobe nodule. This is most consistent with metastatic disease from her breast cancer, although primary lung cancer is not excluded. -I recommend lung mass biopsy to confirm the metastasis and rule out second malignancy. The left lung lesion is easily accessible by bronchoscopy, and both lung lesions are accessible by CT-guided needle biopsy. I have discussed the pros and cons of bronchoscopy versus needle biopsy with patient, and she opted to have needle biopsy. I will arrange it through interventional radiology. I also consulted thoracic surgeon Dr. Servando Snare today and he will be available to do bronchoscopy next Friday if needle biopsy is inconclusive. -If biopsy confirmed metastatic breast cancer, then the goal of therapy is palliation. Due to the multiple metastasis, this is unfortunately incurable disease. I would recommend single agent chemotherapy weekly paclitaxel in combination with 2 HER-2 antibody trastuzumab and  Pertuzumab, tentatively starting in about 2 weeks.  -We discussed that surgery has very limited role in metastatic breast cancer. Unless she had compete or near complete response to first-line chemotherapy, then we may consider primary breast surgery to maximize his her disease control. Otherwise I would not recommend breast surgery as part of her planned treatment. -I'll also obtain a bone scan to complete staging.  Plan #1 IR CT-guided lung mass biopsy,  Prefer right middle lobe lung mass #2 bone scan #3 chemotherapy class next week  #4 tentatively start chemotherapy before Christmas.  Patient and her family members had a many questions, I answered to their satisfaction.  I spent about 40 minutes counseling the patient and her family members, total care was about 60 minutes.  [name]  07/22/2014

## 2014-07-22 NOTE — Progress Notes (Signed)
[address]       [place] [address]             [facility] Record #[id number] Date of Birth: April 08, 1971  Referring: [name], DO Primary Care: [name], DO  Chief Complaint:   No chief complaint on file.   History of Present Illness:    [name] [name] 43 y.o. female is seen in the [facility] clinic today  At request  Of Dr Burr Medico. 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   Ct scan shows bilateral lung masses, unable to do PET scan    Current Activity/ Functional Status:  Patient is independent with mobility/ambulation, transfers, ADL's, IADL's.   Zubrod Score: At the time of surgery this patient's most appropriate activity status/level should be described as: [x]     0    Normal activity, no symptoms []     1    Restricted in physical strenuous activity but ambulatory, able to do out light work []     2    Ambulatory and capable of self care, unable to do work activities, up and about               >50 % of waking hours                              []     3    Only limited self care, in bed greater than 50% of waking hours []     4    Completely disabled, no self care, confined to bed or chair []     5    Moribund   Past Medical History  Diagnosis Date  . Breast cancer   . Pneumonia     hx of pneumonia 08/2013   . GERD (gastroesophageal reflux disease)     during pregnancy     Past Surgical History  Procedure Laterality Date  . Cesarean section    . Essure tubal ligation    . Cholecystectomy    . Wisdom tooth extraction    . Portacath placement Left 07/21/2014    Procedure: INSERTION PORT-A-CATH;  Surgeon: Stark Klein, MD;  Location: [facility];  Service: General;  Laterality: Left;    Family History  Problem Relation Age of Onset  . Breast cancer Mother 67  . Liver cancer Father   . Breast cancer Maternal Aunt 34  . Prostate cancer Maternal  Grandfather 39  . Liver cancer Paternal Grandmother   . Prostate cancer Paternal Grandfather   . Prostate cancer Maternal Uncle 73  . Breast cancer Maternal Aunt 38  . Prostate cancer Maternal Uncle 68  . Breast cancer Cousin 45    mat first cousin    History   Social History  . Marital Status: Married    Spouse Name: N/A    Number of Children: N/A  . Years of Education: N/A   Occupational History  . Not on file.   Social History Main Topics  . Smoking status: Never Smoker   . Smokeless tobacco: Never Used  . Alcohol Use: Yes     Comment: 2-3 drinks per week   . Drug Use: No  . Sexual Activity: No   Other Topics Concern  . Not on file  Social History Narrative    History  Smoking status  . Never Smoker   Smokeless tobacco  . Never Used    History  Alcohol Use  . Yes    Comment: 2-3 drinks per week      Allergies  Allergen Reactions  . Aspirin     Upsets stomach  . Codeine     Makes loopy     Current Outpatient Prescriptions  Medication Sig Dispense Refill  . albuterol (PROVENTIL HFA;VENTOLIN HFA) 108 (90 BASE) MCG/ACT inhaler Inhale 2 puffs into the lungs every 4 (four) hours as needed for wheezing or shortness of breath.    . Camphor-Eucalyptus-Menthol (VICKS VAPORUB EX) Apply 1 application topically at bedtime as needed (sinus). Applies under nose, throat and on chest.    . cetirizine (ZYRTEC) 10 MG tablet Take 10 mg by mouth daily as needed for allergies.     Marland Kitchen ibuprofen (ADVIL,MOTRIN) 200 MG tablet Take 400-800 mg by mouth every 6 (six) hours as needed for headache or moderate pain.    Marland Kitchen ibuprofen (ADVIL,MOTRIN) 800 MG tablet Take 800 mg by mouth as needed.  0  . metaxalone (SKELAXIN) 800 MG tablet Take 800 mg by mouth 3 (three) times daily as needed for muscle spasms.    . Neomycin-Bacitracin-Polymyxin (TRIPLE ANTIBIOTIC EX) Apply 1 application topically 2 (two) times daily as needed (eczema on hands.).    Marland Kitchen oxyCODONE-acetaminophen (ROXICET)  5-325 MG per tablet Take 1-2 tablets by mouth every 4 (four) hours as needed for severe pain. 30 tablet 0  . Pseudoeph-Doxylamine-DM-APAP (NYQUIL PO) Take 30 mLs by mouth 2 (two) times daily as needed (cold/allergies).      No current facility-administered medications for this visit.     Review of Systems:     Cardiac Review of Systems: Y or N  Chest Pain [ n   ]  Resting SOB [  n ] Exertional SOB  [n  ]  Orthopnea [ n ]   Pedal Edema [ n  ]    Palpitations [ n ] Syncope  [n  ]   Presyncope [  n ]  General Review of Systems: [Y] = yes [  ]=no Constitional: recent weight change Blue.Reese  ];  Wt loss over the last 3 months [   ] anorexia [  ]; fatigue [  ]; nausea [  ]; night sweats [  ]; fever [  ]; or chills [  ];          Dental: poor dentition[  ]; Last Dentist visit:   Eye : blurred vision [  ]; diplopia [   ]; vision changes [  ];  Amaurosis fugax[  ]; Resp: cough [  ];  wheezing[  ];  hemoptysis[  ]; shortness of breath[  ]; paroxysmal nocturnal dyspnea[  ]; dyspnea on exertion[  ]; or orthopnea[  ];  GI:  gallstones[  ], vomiting[  ];  dysphagia[  ]; melena[  ];  hematochezia [  ]; heartburn[  ];   Hx of  Colonoscopy[  ]; GU: kidney stones [  ]; hematuria[  ];   dysuria [  ];  nocturia[  ];  history of     obstruction [  ]; urinary frequency [  ]             Skin: rash, swelling[  ];, hair loss[  ];  peripheral edema[  ];  or itching[  ]; Musculosketetal: myalgias[  ];  joint swelling[  ];  joint erythema[  ];  joint pain[  ];  back pain[  ];  Heme/Lymph: bruising[  ];  bleeding[  ];  anemia[  ];  Neuro: TIA[  ];  headaches[  ];  stroke[  ];  vertigo[  ];  seizures[  ];   paresthesias[  ];  difficulty walking[  ];  Psych:depression[  ]; anxiety[  ];  Endocrine: diabetes[  ];  thyroid dysfunction[  ];  Immunizations: Flu up to date [  ]; Pneumococcal up to date [  ];  Other:  Physical Exam: LMP 06/22/2014 (Approximate)  PHYSICAL EXAMINATION:  General appearance: alert and  cooperative Neurologic: intact Heart: regular rate and rhythm, S1, S2 normal, no murmur, click, rub or gallop Lungs: clear to auscultation bilaterally Abdomen: soft, non-tender; bowel sounds normal; no masses,  no organomegaly Extremities: extremities normal, atraumatic, no cyanosis or edema and Homans sign is negative, no sign of DVT   Diagnostic Studies & Laboratory data:     Recent Radiology Findings:   Ct Chest W Contrast  07/20/2014   CLINICAL DATA:  43 year old new breast cancer staging, questionable lung mass  EXAM: CT CHEST, ABDOMEN, AND PELVIS WITH CONTRAST  TECHNIQUE: Multidetector CT imaging of the chest, abdomen and pelvis was performed following the standard protocol during bolus administration of intravenous contrast.  CONTRAST:  127m OMNIPAQUE IOHEXOL 300 MG/ML  SOLN  COMPARISON:  None.  FINDINGS: CT CHEST FINDINGS  Breasts: In the lower inner quadrant of the right breast there is a 2.5 x 4.2 x 2.8 cm mass, likely correspond to patient's known malignancy. Just superior to this mass there is a 1.8 x 1.1 cm mass. Additional subcentimeter soft tissue masses are seen adjacent to the primary mass and may represent satellite lesions.  Chest: An approximately 4.7 x 4.2 x 4.2 cm mass is noted in the left upper lobe. A 2.1 x 2.1 x 1.5 cm right middle lobe pulmonary nodule is present. 6 mm and 4 mm pulmonary nodules are present within the right lower lobe. A questionable 5 mm pulmonary pulmonary nodule is noted at the right lung base (series 4, image 43). 8 mm and 4 mm left lower lobe pulmonary nodules are also present.  There is no focal airspace consolidation, pleural effusion or pneumothorax.  Heart/mediastinum: The heart size is normal. There is no pericardial effusion.  Enlarged right axillary lymph nodes are present the largest measures approximately 4.3 x 2.2 cm. A 1.1 cm left hilar lymph node is present.  CT ABDOMEN AND PELVIS FINDINGS  Liver: The liver is mildly low in attenuation,  compatible with hepatic steatosis. No suspicious hepatic mass is seen.  Gallbladder: The gallbladder is surgically absent. Surgical clips are noted within the gallbladder fossa.  Spleen: Unremarkable.  Pancreas: Unremarkable.  Adrenal glands: Unremarkable.  Kidneys: A 3 mm nonobstructing stone is noted in the lower pole of the right kidney. The kidneys are otherwise unremarkable.  Bowel/gastrointestinal tract: There is no evidence for bowel obstruction. No abnormal bowel wall thickening is identified.  Pelvis: Linear metallic densities are noted in the region of the fallopian tubes and may represent a contraceptive devices. There is no pelvic mass. The urinary bladder is distended.  Miscellaneous: There is no intra-abdominal or retroperitoneal adenopathy. There is no free air or free fluid.  Osseous structures: Thoracolumbar scoliosis is present. No suspicious osseous lesion is identified.  IMPRESSION: 1. Right breast mass is compatible with known malignancy. Adjacent soft tissue masses may represent satellite lesions. Recommend correlation with mammography, ultrasound  or MRI. 2. Left upper lobe, right middle lobe and bilateral lower lobe pulmonary masses/nodules are concerning for metastatic disease. 3. Right axillary and left hilar adenopathy are concerning for metastatic disease. 4. 3 mm nonobstructing right renal stone.   Electronically Signed   By: Rosemarie Ax   On: 07/20/2014 17:05   Ct Abdomen Pelvis W Contrast  07/20/2014   CLINICAL DATA:  43 year old new breast cancer staging, questionable lung mass  EXAM: CT CHEST, ABDOMEN, AND PELVIS WITH CONTRAST  TECHNIQUE: Multidetector CT imaging of the chest, abdomen and pelvis was performed following the standard protocol during bolus administration of intravenous contrast.  CONTRAST:  198m OMNIPAQUE IOHEXOL 300 MG/ML  SOLN  COMPARISON:  None.  FINDINGS: CT CHEST FINDINGS  Breasts: In the lower inner quadrant of the right breast there is a 2.5 x 4.2 x 2.8 cm  mass, likely correspond to patient's known malignancy. Just superior to this mass there is a 1.8 x 1.1 cm mass. Additional subcentimeter soft tissue masses are seen adjacent to the primary mass and may represent satellite lesions.  Chest: An approximately 4.7 x 4.2 x 4.2 cm mass is noted in the left upper lobe. A 2.1 x 2.1 x 1.5 cm right middle lobe pulmonary nodule is present. 6 mm and 4 mm pulmonary nodules are present within the right lower lobe. A questionable 5 mm pulmonary pulmonary nodule is noted at the right lung base (series 4, image 43). 8 mm and 4 mm left lower lobe pulmonary nodules are also present.  There is no focal airspace consolidation, pleural effusion or pneumothorax.  Heart/mediastinum: The heart size is normal. There is no pericardial effusion.  Enlarged right axillary lymph nodes are present the largest measures approximately 4.3 x 2.2 cm. A 1.1 cm left hilar lymph node is present.  CT ABDOMEN AND PELVIS FINDINGS  Liver: The liver is mildly low in attenuation, compatible with hepatic steatosis. No suspicious hepatic mass is seen.  Gallbladder: The gallbladder is surgically absent. Surgical clips are noted within the gallbladder fossa.  Spleen: Unremarkable.  Pancreas: Unremarkable.  Adrenal glands: Unremarkable.  Kidneys: A 3 mm nonobstructing stone is noted in the lower pole of the right kidney. The kidneys are otherwise unremarkable.  Bowel/gastrointestinal tract: There is no evidence for bowel obstruction. No abnormal bowel wall thickening is identified.  Pelvis: Linear metallic densities are noted in the region of the fallopian tubes and may represent a contraceptive devices. There is no pelvic mass. The urinary bladder is distended.  Miscellaneous: There is no intra-abdominal or retroperitoneal adenopathy. There is no free air or free fluid.  Osseous structures: Thoracolumbar scoliosis is present. No suspicious osseous lesion is identified.  IMPRESSION: 1. Right breast mass is compatible  with known malignancy. Adjacent soft tissue masses may represent satellite lesions. Recommend correlation with mammography, ultrasound or MRI. 2. Left upper lobe, right middle lobe and bilateral lower lobe pulmonary masses/nodules are concerning for metastatic disease. 3. Right axillary and left hilar adenopathy are concerning for metastatic disease. 4. 3 mm nonobstructing right renal stone.   Electronically Signed   By: KRosemarie Ax  On: 07/20/2014 17:05   Mr Breast Bilateral W Wo Contrast  07/16/2014   CLINICAL DATA:  Recent diagnosis of right breast cancer  LABS:  Does not apply  EXAM: BILATERAL BREAST MRI WITH AND WITHOUT CONTRAST  TECHNIQUE: Multiplanar, multisequence MR images of both breasts were obtained prior to and following the intravenous administration of 232mof MultiHance.  THREE-DIMENSIONAL MR IMAGE  RENDERING ON INDEPENDENT WORKSTATION:  Three-dimensional MR images were rendered by post-processing of the original MR data on an independent workstation. The three-dimensional MR images were interpreted, and findings are reported in the following complete MRI report for this study. Three dimensional images were evaluated at the independent DynaCad workstation  COMPARISON:  Previous exams  FINDINGS: Breast composition: c:  Heterogeneous fibroglandular tissue  Background parenchymal enhancement: Moderate  Right breast: In the posterior medial lower right breast, there is a conglomerate interconnected masses with irregular borders, largest is the most posterior portion with biopsy clip within it. The second largest portion just anterior to the largest mass also has a biopsy clip within it. The conglomerate of masses demonstrate washout enhancement kinetics. The masses encompass a total area of 4.3 x 7 x 6.3 cm with extension towards the subdermal medial breast.  Left breast: No mass or abnormal enhancement.  Lymph nodes: There is abnormal enlarged enhancing right axillary lymph node measuring 4.4 x  2.5 x 4 cm.  Ancillary findings:  None.  IMPRESSION: Findings consistent with patient's biopsy-proven medial right breast cancer. Abnormal right axillary lymph nodes identified consistent with biopsy proven lymph node metastasis.  RECOMMENDATION: Treatment plan.  BI-RADS CATEGORY  6: Known biopsy-proven malignancy.   Electronically Signed   By: Abelardo Diesel M.D.   On: 07/16/2014 16:04   Dg Chest Port 1 View  07/21/2014   CLINICAL DATA:  Postop Port-A-Cath insertion  EXAM: PORTABLE CHEST - 1 VIEW  COMPARISON:  CT chest of 07/20/2014  FINDINGS: The previously noted lung masses are again identified in the left mid upper lung and the right lung base consistent with lung metastases. No pleural effusion is seen. A left-sided Port-A-Cath is now present with the tip overlying the lower SVC near the expected SVC -RA junction. No pneumothorax is seen. The lungs are not well aerated with volume loss bilaterally. Mild cardiomegaly is stable. Thoracolumbar scoliosis is noted.  IMPRESSION: 1. Left-sided Port-A-Cath tip overlies the lower SVC near the expected RA junction. No pneumothorax. 2. No change in lung masses as previously identified.   Electronically Signed   By: Ivar Drape M.D.   On: 07/21/2014 10:11   Dg C-arm 1-60 Min-no Report  07/21/2014   : Fluoroscopy was utilized by the requesting physician. No radiographic interpretation.   Electronically Signed   By: Porfirio Mylar   On: 07/21/2014 10:19      Recent Lab Findings: Lab Results  Component Value Date   WBC 10.2 07/14/2014   HGB 13.5 07/14/2014   HCT 40.8 07/14/2014   PLT 263 07/14/2014   GLUCOSE 73 07/14/2014   ALT 21 07/14/2014   AST 23 07/14/2014   NA 139 07/14/2014   K 4.0 07/14/2014   CREATININE 0.9 07/14/2014   BUN 10.9 07/14/2014   CO2 26 07/14/2014      Assessment / Plan:   Attempt needle biopsy of right lung lesion to obtain tissue dx, lung primary vs breast cancer metastatic to lung  I adequate material is not obtained I have  discussed with  Patient proceeding with bronchoscopy with biopsy under general anthesthia.      I spent 20 minutes counseling the patient face to face. The total time spent in the appointment was 30 minutes.  Grace Isaac MD      [address] [place] [address] Office [phone]   Beeper [phone]  07/22/2014 9:30 PM

## 2014-07-22 NOTE — Progress Notes (Signed)
[facility] New Patient Visit  REFERRING PROVIDER: [name], DO [address] [place], [place] [address]  PATIENT CARE TEAM: [name], DO as PCP - General (Family Medicine) [name], DO (Family Medicine) [name], NP as Nurse Practitioner (Nurse Practitioner) Stark Klein, MD as Consulting Physician (General Surgery) [name], MD as Consulting Physician (Radiation Oncology) [name], MD as Consulting Physician (Hematology)  PRIMARY REASON FOR VISIT:  Personal history of breast cancer  HISTORY OF PRESENT ILLNESS:   [name] [name], a 43 y.o. female, was seen for a [facility] cancer genetics consultation at the request of Dr. Leory Plowman due to a personal and family history of cancer.  [name] [name] presents to clinic today to discuss the possibility of a hereditary predisposition to cancer, genetic testing, and to further clarify her future cancer risks, as well as potential cancer risks for family members.   CANCER HISTORY:  [name] [name], a 43 y.o. female, felt a lump in her right breast and another lump in her right axilla. She underwent bilateral diagnostic mammogram on 07/02/2014, which showed a 2 cm mobile mass in the right breast at 4:00 position and a 1.8 cm enlarged lymph nodes in the right axilla tail. She also had an right shoulder x-ray which showed large wound density in the right lung. 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 The tumor was ER negative, PR negative, HER-2 positive. She is planning to undergo neoadjuvant chemotherapy followed by surgery and possibly radiation.   Past Medical History  Diagnosis Date   Breast cancer    Pneumonia     hx of pneumonia 08/2013    GERD (gastroesophageal reflux disease)     during pregnancy      Past Surgical History  Procedure Laterality Date   Cesarean section     Essure tubal ligation     Cholecystectomy     Wisdom tooth extraction      History   Social History   Marital Status: Married    Spouse Name: N/A    Number of Children: N/A   Years of Education: N/A   Social History Main Topics   Smoking status: Never Smoker    Smokeless tobacco: Never Used   Alcohol Use: Yes     Comment: 2-3 drinks per week    Drug Use: No   Sexual Activity: No   Other Topics Concern   Not on file   Social History Narrative     FAMILY HISTORY:  During the visit, a 4-generation pedigree was obtained. A copy of the pedigree with be scanned into Epic under the Media tab. Significant family history diagnoses include the following: Family History  Problem Relation Age of Onset   Breast cancer Mother 25   Liver cancer Father    Breast cancer Maternal Aunt 40   Prostate cancer Maternal Grandfather 44   Liver cancer Paternal Grandmother    Prostate cancer Paternal Grandfather    Prostate cancer Maternal Uncle 73   Breast cancer Maternal Aunt 59   Prostate cancer Maternal Uncle 68   Breast cancer Cousin 53    mat first cousin   [name] [name]'s ancestry is of African American descent. There is no known Jewish ancestry or consanguinity.  GENETIC COUNSELING ASSESSMENT:  [name] [name] is a 43 y.o.  female with a personal and family history of cancer suggestive of a hereditary predisposition to cancer. We, therefore, discussed and recommended the following at today's visit.   DISCUSSION:  We reviewed the characteristics, features and inheritance patterns of hereditary cancer syndromes. We also discussed genetic testing, including the appropriate family members to test, the process of testing, insurance coverage and turn-around-time for results. We discussed the implications of a negative, positive and/or variant of uncertain significant result. We recommended Ms.  [name] pursue genetic testing for the BreastNext gene panel. The BreastNext gene panel offered by [employer] includes sequencing and rearrangement analysis for the following 17 genes: ATM, BARD1, BRCA1, BRCA2, BRIP1, CDH1, CHEK2, MRE11A, MUTYH, NBN, NF1, PALB2, PTEN, RAD50, RAD51C, RAD51D, and TP53.  PLAN:  Based on our above recommendation, Ms. [name] wished to pursue genetic testing and the blood sample was drawn and will be sent to [facility] for analysis. Results should be available within approximately 6 weeks time, at which point they will be disclosed by telephone to [name] [name], as will any additional recommendations warranted by these results. Lastly, we encouraged Ms. Maris to remain in contact with cancer genetics annually so that we can continuously update the family history and inform her of any changes in cancer genetics and testing that may be of benefit for this family.   Ms.  [name] questions were answered to her satisfaction today. Our contact information was provided should additional questions or concerns arise. Thank you for the referral and allowing [place] to share in the care of your patient.   Catherine A. Fine, MS, CGC Certified Psychologist, sport [name] phone: [phone]  The patient was seen for a total of 45 minutes in face-to-face genetic counseling.  This patient was discussed with Dr. Burr Medico who agrees with the above.    ______________________________________________________________________ For Office Staff:  Number of people involved in session including genetic counselor: 3 Was an intern or student involved with case: not applicable

## 2014-07-23 ENCOUNTER — Other Ambulatory Visit: Payer: Self-pay | Admitting: Radiology

## 2014-07-23 ENCOUNTER — Ambulatory Visit: Payer: BC Managed Care – PPO | Admitting: Hematology

## 2014-07-23 ENCOUNTER — Telehealth: Payer: Self-pay | Admitting: Hematology

## 2014-07-23 ENCOUNTER — Other Ambulatory Visit: Payer: Self-pay | Admitting: *Deleted

## 2014-07-23 NOTE — Telephone Encounter (Signed)
Confirm appt for 07/30/14.

## 2014-07-26 ENCOUNTER — Encounter (HOSPITAL_COMMUNITY): Payer: Self-pay

## 2014-07-26 ENCOUNTER — Ambulatory Visit (HOSPITAL_COMMUNITY): Payer: BC Managed Care – PPO

## 2014-07-26 ENCOUNTER — Ambulatory Visit (HOSPITAL_COMMUNITY)
Admission: RE | Admit: 2014-07-26 | Discharge: 2014-07-26 | Disposition: A | Discharge status: Home / Self Care | Payer: BC Managed Care – PPO | Source: Ambulatory Visit | Attending: Hematology | Admitting: Hematology

## 2014-07-26 ENCOUNTER — Ambulatory Visit (HOSPITAL_COMMUNITY)
Admission: RE | Admit: 2014-07-26 | Discharge: 2014-07-26 | Disposition: A | Discharge status: Home / Self Care | Payer: BC Managed Care – PPO | Source: Ambulatory Visit | Attending: Interventional Radiology | Admitting: Interventional Radiology

## 2014-07-26 DIAGNOSIS — I878 Other specified disorders of veins: Secondary | ICD-10-CM | POA: Diagnosis not present

## 2014-07-26 DIAGNOSIS — C50311 Malignant neoplasm of lower-inner quadrant of right female breast: Secondary | ICD-10-CM

## 2014-07-26 DIAGNOSIS — Z79899 Other long term (current) drug therapy: Secondary | ICD-10-CM | POA: Insufficient documentation

## 2014-07-26 DIAGNOSIS — I517 Cardiomegaly: Secondary | ICD-10-CM | POA: Insufficient documentation

## 2014-07-26 DIAGNOSIS — R918 Other nonspecific abnormal finding of lung field: Secondary | ICD-10-CM | POA: Diagnosis present

## 2014-07-26 DIAGNOSIS — C50911 Malignant neoplasm of unspecified site of right female breast: Secondary | ICD-10-CM | POA: Diagnosis not present

## 2014-07-26 DIAGNOSIS — J939 Pneumothorax, unspecified: Secondary | ICD-10-CM

## 2014-07-26 LAB — CBC
HCT: 41.6 % (ref 36.0–46.0)
HEMOGLOBIN: 14.1 g/dL (ref 12.0–15.0)
MCH: 29.4 pg (ref 26.0–34.0)
MCHC: 33.9 g/dL (ref 30.0–36.0)
MCV: 86.8 fL (ref 78.0–100.0)
PLATELETS: 273 10*3/uL (ref 150–400)
RBC: 4.79 MIL/uL (ref 3.87–5.11)
RDW: 12.8 % (ref 11.5–15.5)
WBC: 8.1 10*3/uL (ref 4.0–10.5)

## 2014-07-26 LAB — APTT: aPTT: 32 seconds (ref 24–37)

## 2014-07-26 LAB — PROTIME-INR
INR: 1.04 (ref 0.00–1.49)
PROTHROMBIN TIME: 13.7 s (ref 11.6–15.2)

## 2014-07-26 MED ORDER — FENTANYL CITRATE 0.05 MG/ML IJ SOLN
INTRAMUSCULAR | Status: AC | PRN
  Administered 2014-07-26 (×3): 25 ug via INTRAVENOUS

## 2014-07-26 MED ORDER — SODIUM CHLORIDE 0.9 % IV SOLN
INTRAVENOUS | Status: DC
  Administered 2014-07-26: 10:00:00 via INTRAVENOUS

## 2014-07-26 MED ORDER — FENTANYL CITRATE 0.05 MG/ML IJ SOLN
INTRAMUSCULAR | Status: AC
  Filled 2014-07-26: qty 2

## 2014-07-26 MED ORDER — MIDAZOLAM HCL 2 MG/2ML IJ SOLN
INTRAMUSCULAR | Status: AC
  Filled 2014-07-26: qty 4

## 2014-07-26 MED ORDER — LIDOCAINE-EPINEPHRINE 1 %-1:100000 IJ SOLN
INTRAMUSCULAR | Status: AC
  Filled 2014-07-26: qty 1

## 2014-07-26 MED ORDER — MIDAZOLAM HCL 2 MG/2ML IJ SOLN
INTRAMUSCULAR | Status: AC | PRN
  Administered 2014-07-26 (×2): 0.5 mg via INTRAVENOUS

## 2014-07-26 NOTE — Sedation Documentation (Signed)
Bandaid placed on upper left back biopsy site.

## 2014-07-26 NOTE — Sedation Documentation (Signed)
Left Lung biopsy obtained per MD.

## 2014-07-26 NOTE — Sedation Documentation (Signed)
Patient denies pain and is resting comfortably.  

## 2014-07-26 NOTE — H&P (Signed)
Chief Complaint: New Breast Ca 06/2014 Pulmonary nodules  Referring Physician(s): Feng,Yan  History of Present Illness: [name] [name] is a 43 y.o. female   Pt diagnosed with Rt breast Ca 06/2014 CT scan 07/03/14 reveals B pulmonary nodules concerning for metastasis Rt axillary and Lt hilar lymphadenopathy Now scheduled for Rt or Lt lung mass biopsy   Past Medical History  Diagnosis Date  . Breast cancer   . Pneumonia     hx of pneumonia 08/2013   . GERD (gastroesophageal reflux disease)     during pregnancy     Past Surgical History  Procedure Laterality Date  . Cesarean section    . Essure tubal ligation    . Cholecystectomy    . Wisdom tooth extraction    . Portacath placement Left 07/21/2014    Procedure: INSERTION PORT-A-CATH;  Surgeon: Stark Klein, MD;  Location: [facility];  Service: General;  Laterality: Left;    Allergies: Aspirin and Codeine  Medications: Prior to Admission medications   Medication Sig Start Date End Date Taking? Authorizing Provider  albuterol (PROVENTIL HFA;VENTOLIN HFA) 108 (90 BASE) MCG/ACT inhaler Inhale 2 puffs into the lungs every 4 (four) hours as needed for wheezing or shortness of breath.   Yes Historical Provider, MD  Camphor-Eucalyptus-Menthol (VICKS VAPORUB EX) Apply 1 application topically at bedtime as needed (sinus). Applies under nose, throat and on chest.   Yes Historical Provider, MD  cetirizine (ZYRTEC) 10 MG tablet Take 10 mg by mouth daily as needed for allergies.    Yes Historical Provider, MD  metaxalone (SKELAXIN) 800 MG tablet Take 800 mg by mouth 3 (three) times daily as needed for muscle spasms.   Yes Historical Provider, MD  Neomycin-Bacitracin-Polymyxin (TRIPLE ANTIBIOTIC EX) Apply 1 application topically 2 (two) times daily as needed (eczema on hands.).   Yes Historical Provider, MD  oxyCODONE-acetaminophen (ROXICET) 5-325 MG per tablet Take 1-2 tablets by mouth every 4 (four) hours as needed for severe pain.  07/21/14  Yes Stark Klein, MD  Pseudoeph-Doxylamine-DM-APAP (NYQUIL PO) Take 30 mLs by mouth 2 (two) times daily as needed (cold/allergies).    Yes Historical Provider, MD  ibuprofen (ADVIL,MOTRIN) 200 MG tablet Take 400-800 mg by mouth every 6 (six) hours as needed for headache or moderate pain.    Historical Provider, MD  ibuprofen (ADVIL,MOTRIN) 800 MG tablet Take 800 mg by mouth as needed. 07/01/14   Historical Provider, MD    Family History  Problem Relation Age of Onset  . Breast cancer Mother 42  . Liver cancer Father   . Breast cancer Maternal Aunt 50  . Prostate cancer Maternal Grandfather 89  . Liver cancer Paternal Grandmother   . Prostate cancer Paternal Grandfather   . Prostate cancer Maternal Uncle 73  . Breast cancer Maternal Aunt 59  . Prostate cancer Maternal Uncle 68  . Breast cancer Cousin 19    mat first cousin    History   Social History  . Marital Status: Married    Spouse Name: N/A    Number of Children: N/A  . Years of Education: N/A   Social History Main Topics  . Smoking status: Never Smoker   . Smokeless tobacco: Never Used  . Alcohol Use: Yes     Comment: 2-3 drinks per week   . Drug Use: No  . Sexual Activity: No   Other Topics Concern  . None   Social History Narrative     Review of Systems: A 12 point ROS  discussed and pertinent positives are indicated in the HPI above.  All other systems are negative.  Review of Systems  Constitutional: Negative for fever, activity change, appetite change and unexpected weight change.  Respiratory: Negative for cough and shortness of breath.   Cardiovascular: Negative for chest pain.  Gastrointestinal: Negative for abdominal pain.  Genitourinary: Negative for difficulty urinating.  Musculoskeletal: Negative for back pain.  Neurological: Negative for dizziness.  Psychiatric/Behavioral: Negative for behavioral problems and confusion.    Vital Signs: BP 152/82 mmHg  Pulse 86  Temp(Src) 98.3 F  (36.8 C) (Oral)  Resp 20  Ht 5\' 10"  (1.778 m)  Wt 99.791 kg (220 lb)  BMI 31.57 kg/m2  SpO2 100%  LMP 06/22/2014 (Approximate)  Physical Exam  Constitutional: She is oriented to person, place, and time. She appears well-nourished.  Cardiovascular: Normal rate, regular rhythm and normal heart sounds.   No murmur heard. Pulmonary/Chest: Effort normal and breath sounds normal. She has no wheezes.  Abdominal: Soft. Bowel sounds are normal. There is no tenderness.  Musculoskeletal: Normal range of motion.  Neurological: She is alert and oriented to person, place, and time.  Skin: Skin is warm and dry.  Psychiatric: She has a normal mood and affect. Her behavior is normal. Judgment and thought content normal.  Nursing note and vitals reviewed.   Imaging: Ct Chest W Contrast  07/20/2014   CLINICAL DATA:  43 year old new breast cancer staging, questionable lung mass  EXAM: CT CHEST, ABDOMEN, AND PELVIS WITH CONTRAST  TECHNIQUE: Multidetector CT imaging of the chest, abdomen and pelvis was performed following the standard protocol during bolus administration of intravenous contrast.  CONTRAST:  14mL OMNIPAQUE IOHEXOL 300 MG/ML  SOLN  COMPARISON:  None.  FINDINGS: CT CHEST FINDINGS  Breasts: In the lower inner quadrant of the right breast there is a 2.5 x 4.2 x 2.8 cm mass, likely correspond to patient's known malignancy. Just superior to this mass there is a 1.8 x 1.1 cm mass. Additional subcentimeter soft tissue masses are seen adjacent to the primary mass and may represent satellite lesions.  Chest: An approximately 4.7 x 4.2 x 4.2 cm mass is noted in the left upper lobe. A 2.1 x 2.1 x 1.5 cm right middle lobe pulmonary nodule is present. 6 mm and 4 mm pulmonary nodules are present within the right lower lobe. A questionable 5 mm pulmonary pulmonary nodule is noted at the right lung base (series 4, image 43). 8 mm and 4 mm left lower lobe pulmonary nodules are also present.  There is no focal  airspace consolidation, pleural effusion or pneumothorax.  Heart/mediastinum: The heart size is normal. There is no pericardial effusion.  Enlarged right axillary lymph nodes are present the largest measures approximately 4.3 x 2.2 cm. A 1.1 cm left hilar lymph node is present.  CT ABDOMEN AND PELVIS FINDINGS  Liver: The liver is mildly low in attenuation, compatible with hepatic steatosis. No suspicious hepatic mass is seen.  Gallbladder: The gallbladder is surgically absent. Surgical clips are noted within the gallbladder fossa.  Spleen: Unremarkable.  Pancreas: Unremarkable.  Adrenal glands: Unremarkable.  Kidneys: A 3 mm nonobstructing stone is noted in the lower pole of the right kidney. The kidneys are otherwise unremarkable.  Bowel/gastrointestinal tract: There is no evidence for bowel obstruction. No abnormal bowel wall thickening is identified.  Pelvis: Linear metallic densities are noted in the region of the fallopian tubes and may represent a contraceptive devices. There is no pelvic mass. The urinary bladder  is distended.  Miscellaneous: There is no intra-abdominal or retroperitoneal adenopathy. There is no free air or free fluid.  Osseous structures: Thoracolumbar scoliosis is present. No suspicious osseous lesion is identified.  IMPRESSION: 1. Right breast mass is compatible with known malignancy. Adjacent soft tissue masses may represent satellite lesions. Recommend correlation with mammography, ultrasound or MRI. 2. Left upper lobe, right middle lobe and bilateral lower lobe pulmonary masses/nodules are concerning for metastatic disease. 3. Right axillary and left hilar adenopathy are concerning for metastatic disease. 4. 3 mm nonobstructing right renal stone.   Electronically Signed   By: Rosemarie Ax   On: 07/20/2014 17:05   Ct Abdomen Pelvis W Contrast  07/20/2014   CLINICAL DATA:  43 year old new breast cancer staging, questionable lung mass  EXAM: CT CHEST, ABDOMEN, AND PELVIS WITH  CONTRAST  TECHNIQUE: Multidetector CT imaging of the chest, abdomen and pelvis was performed following the standard protocol during bolus administration of intravenous contrast.  CONTRAST:  147mL OMNIPAQUE IOHEXOL 300 MG/ML  SOLN  COMPARISON:  None.  FINDINGS: CT CHEST FINDINGS  Breasts: In the lower inner quadrant of the right breast there is a 2.5 x 4.2 x 2.8 cm mass, likely correspond to patient's known malignancy. Just superior to this mass there is a 1.8 x 1.1 cm mass. Additional subcentimeter soft tissue masses are seen adjacent to the primary mass and may represent satellite lesions.  Chest: An approximately 4.7 x 4.2 x 4.2 cm mass is noted in the left upper lobe. A 2.1 x 2.1 x 1.5 cm right middle lobe pulmonary nodule is present. 6 mm and 4 mm pulmonary nodules are present within the right lower lobe. A questionable 5 mm pulmonary pulmonary nodule is noted at the right lung base (series 4, image 43). 8 mm and 4 mm left lower lobe pulmonary nodules are also present.  There is no focal airspace consolidation, pleural effusion or pneumothorax.  Heart/mediastinum: The heart size is normal. There is no pericardial effusion.  Enlarged right axillary lymph nodes are present the largest measures approximately 4.3 x 2.2 cm. A 1.1 cm left hilar lymph node is present.  CT ABDOMEN AND PELVIS FINDINGS  Liver: The liver is mildly low in attenuation, compatible with hepatic steatosis. No suspicious hepatic mass is seen.  Gallbladder: The gallbladder is surgically absent. Surgical clips are noted within the gallbladder fossa.  Spleen: Unremarkable.  Pancreas: Unremarkable.  Adrenal glands: Unremarkable.  Kidneys: A 3 mm nonobstructing stone is noted in the lower pole of the right kidney. The kidneys are otherwise unremarkable.  Bowel/gastrointestinal tract: There is no evidence for bowel obstruction. No abnormal bowel wall thickening is identified.  Pelvis: Linear metallic densities are noted in the region of the fallopian  tubes and may represent a contraceptive devices. There is no pelvic mass. The urinary bladder is distended.  Miscellaneous: There is no intra-abdominal or retroperitoneal adenopathy. There is no free air or free fluid.  Osseous structures: Thoracolumbar scoliosis is present. No suspicious osseous lesion is identified.  IMPRESSION: 1. Right breast mass is compatible with known malignancy. Adjacent soft tissue masses may represent satellite lesions. Recommend correlation with mammography, ultrasound or MRI. 2. Left upper lobe, right middle lobe and bilateral lower lobe pulmonary masses/nodules are concerning for metastatic disease. 3. Right axillary and left hilar adenopathy are concerning for metastatic disease. 4. 3 mm nonobstructing right renal stone.   Electronically Signed   By: Rosemarie Ax   On: 07/20/2014 17:05   Mr Breast Bilateral W  Wo Contrast  07/16/2014   CLINICAL DATA:  Recent diagnosis of right breast cancer  LABS:  Does not apply  EXAM: BILATERAL BREAST MRI WITH AND WITHOUT CONTRAST  TECHNIQUE: Multiplanar, multisequence MR images of both breasts were obtained prior to and following the intravenous administration of 51ml of MultiHance.  THREE-DIMENSIONAL MR IMAGE RENDERING ON INDEPENDENT WORKSTATION:  Three-dimensional MR images were rendered by post-processing of the original MR data on an independent workstation. The three-dimensional MR images were interpreted, and findings are reported in the following complete MRI report for this study. Three dimensional images were evaluated at the independent DynaCad workstation  COMPARISON:  Previous exams  FINDINGS: Breast composition: c:  Heterogeneous fibroglandular tissue  Background parenchymal enhancement: Moderate  Right breast: In the posterior medial lower right breast, there is a conglomerate interconnected masses with irregular borders, largest is the most posterior portion with biopsy clip within it. The second largest portion just anterior to  the largest mass also has a biopsy clip within it. The conglomerate of masses demonstrate washout enhancement kinetics. The masses encompass a total area of 4.3 x 7 x 6.3 cm with extension towards the subdermal medial breast.  Left breast: No mass or abnormal enhancement.  Lymph nodes: There is abnormal enlarged enhancing right axillary lymph node measuring 4.4 x 2.5 x 4 cm.  Ancillary findings:  None.  IMPRESSION: Findings consistent with patient's biopsy-proven medial right breast cancer. Abnormal right axillary lymph nodes identified consistent with biopsy proven lymph node metastasis.  RECOMMENDATION: Treatment plan.  BI-RADS CATEGORY  6: Known biopsy-proven malignancy.   Electronically Signed   By: Abelardo Diesel M.D.   On: 07/16/2014 16:04   Dg Chest Port 1 View  07/21/2014   CLINICAL DATA:  Postop Port-A-Cath insertion  EXAM: PORTABLE CHEST - 1 VIEW  COMPARISON:  CT chest of 07/20/2014  FINDINGS: The previously noted lung masses are again identified in the left mid upper lung and the right lung base consistent with lung metastases. No pleural effusion is seen. A left-sided Port-A-Cath is now present with the tip overlying the lower SVC near the expected SVC -RA junction. No pneumothorax is seen. The lungs are not well aerated with volume loss bilaterally. Mild cardiomegaly is stable. Thoracolumbar scoliosis is noted.  IMPRESSION: 1. Left-sided Port-A-Cath tip overlies the lower SVC near the expected RA junction. No pneumothorax. 2. No change in lung masses as previously identified.   Electronically Signed   By: Ivar Drape M.D.   On: 07/21/2014 10:11   Dg C-arm 1-60 Min-no Report  07/21/2014   : Fluoroscopy was utilized by the requesting physician. No radiographic interpretation.   Electronically Signed   By: Porfirio Mylar   On: 07/21/2014 10:19    Labs:  CBC:  Recent Labs  07/14/14 1251 07/26/14 0949  WBC 10.2 8.1  HGB 13.5 14.1  HCT 40.8 41.6  PLT 263 273    COAGS:  Recent Labs   07/26/14 0949  INR 1.04  APTT 32    BMP:  Recent Labs  07/14/14 1251  NA 139  K 4.0  CO2 26  GLUCOSE 73  BUN 10.9  CALCIUM 9.9  CREATININE 0.9    LIVER FUNCTION TESTS:  Recent Labs  07/14/14 1251  BILITOT 0.36  AST 23  ALT 21  ALKPHOS 79  PROT 8.4*  ALBUMIN 3.8    TUMOR MARKERS: No results for input(s): AFPTM, CEA, CA199, CHROMGRNA in the last 8760 hours.  Assessment and Plan:  New dx breast  ca CT scan reveals B pulmonary nodules concerning for metastasis Now scheduled for Biopsy of one lung mass Pt aware of procedure benefits and risks and agreeable to proceed Consent signed andin chart  Thank you for this interesting consult.  I greatly enjoyed meeting [name] [name] and look forward to participating in their care.    I spent a total of 20 minutes face to face in clinical consultation, greater than 50% of which was counseling/coordinating care for Lung mass bx  Signed: Britton Bera A 07/26/2014, 10:33 AM

## 2014-07-26 NOTE — Procedures (Signed)
Technically successful CT guided biopsy of left lung mass.  No immediate post procedural complications.

## 2014-07-26 NOTE — Discharge Instructions (Signed)
Needle Biopsy of Lung, Care After °Refer to this sheet in the next few weeks. These instructions provide you with information on caring for yourself after your procedure. Your health care provider may also give you more specific instructions. Your treatment has been planned according to current medical practices, but problems sometimes occur. Call your health care provider if you have any problems or questions after your procedure. °WHAT TO EXPECT AFTER THE PROCEDURE °· A bandage will be applied over the area where the needle was inserted. You may be asked to apply pressure to the bandage for several minutes to ensure there is minimal bleeding. °· In most cases, you can leave when your needle biopsy procedure is completed. Do not drive yourself home. Someone else should take you home. °· If you received an IV sedative or general anesthetic, you will be taken to a comfortable place to relax while the medicine wears off. °· If you have upcoming travel scheduled, talk to your health care provider about when it is safe to travel by air after the procedure. °HOME CARE INSTRUCTIONS °· Expect to take it easy for the rest of the day. °· Protect the area where you received the needle biopsy by keeping the bandage in place for as long as instructed. °· You may feel some mild pain or discomfort in the area, but this should stop in a day or two. °· Take medicines only as directed by your health care provider. °SEEK MEDICAL CARE IF:  °· You have pain at the biopsy site that worsens or is not helped by medicine. °· You have swelling or drainage at the needle biopsy site. °· You have a fever. °SEEK IMMEDIATE MEDICAL CARE IF:  °· You have new or worsening shortness of breath. °· You have chest pain. °· You are coughing up blood. °· You have bleeding that does not stop with pressure or a bandage. °· You develop light-headedness or fainting. °Document Released: 05/27/2007 Document Revised: 12/14/2013 Document Reviewed:  12/22/2012 °[employer]® Patient Information ©2015 [employer]. This information is not intended to replace advice given to you by your health care provider. Make sure you discuss any questions you have with your health care provider. ° °

## 2014-07-27 ENCOUNTER — Telehealth: Payer: Self-pay | Admitting: *Deleted

## 2014-07-27 ENCOUNTER — Ambulatory Visit: Payer: BC Managed Care – PPO | Admitting: Hematology

## 2014-07-27 ENCOUNTER — Telehealth: Payer: Self-pay | Admitting: Hematology

## 2014-07-27 NOTE — Telephone Encounter (Signed)
Spoke with pt today and instructed pt to keep appt with Dr. Burr Medico on 07/28/14 as scheduled.  Pt voiced understanding.

## 2014-07-27 NOTE — Telephone Encounter (Signed)
Confirm appt for 12/16 & 12/18. Pt will call 07/28/14 to see if results are in if she needs appt (per staff mess).

## 2014-07-28 ENCOUNTER — Telehealth: Payer: Self-pay | Admitting: Hematology

## 2014-07-28 ENCOUNTER — Ambulatory Visit (HOSPITAL_BASED_OUTPATIENT_CLINIC_OR_DEPARTMENT_OTHER): Payer: BC Managed Care – PPO | Admitting: Hematology

## 2014-07-28 ENCOUNTER — Other Ambulatory Visit: Payer: Self-pay | Admitting: Hematology

## 2014-07-28 ENCOUNTER — Encounter: Payer: Self-pay | Admitting: Hematology

## 2014-07-28 ENCOUNTER — Other Ambulatory Visit: Payer: Self-pay | Admitting: *Deleted

## 2014-07-28 VITALS — BP 155/88 | HR 95 | Temp 98.7°F | Resp 19 | Ht 70.0 in | Wt 215.2 lb

## 2014-07-28 DIAGNOSIS — C50311 Malignant neoplasm of lower-inner quadrant of right female breast: Secondary | ICD-10-CM

## 2014-07-28 DIAGNOSIS — C7802 Secondary malignant neoplasm of left lung: Secondary | ICD-10-CM

## 2014-07-28 DIAGNOSIS — C7800 Secondary malignant neoplasm of unspecified lung: Principal | ICD-10-CM

## 2014-07-28 DIAGNOSIS — C7801 Secondary malignant neoplasm of right lung: Secondary | ICD-10-CM

## 2014-07-28 DIAGNOSIS — C50911 Malignant neoplasm of unspecified site of right female breast: Secondary | ICD-10-CM

## 2014-07-28 MED ORDER — LIDOCAINE-PRILOCAINE 2.5-2.5 % EX CREA: 1.0000 "application " | TOPICAL_CREAM | CUTANEOUS | Status: DC | PRN

## 2014-07-28 MED ORDER — ONDANSETRON HCL 8 MG PO TABS: 8.0000 mg | ORAL_TABLET | Freq: Three times a day (TID) | ORAL | Status: DC | PRN

## 2014-07-28 NOTE — Telephone Encounter (Signed)
Gave avs & cal for Dec/Jan. Sent mess to move tx to earlier appts.

## 2014-07-28 NOTE — Progress Notes (Signed)
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 MRI breast on 07/16/2014 showed 7cm R breast lesion and 4.4cm r axillary node    07/02/2014 Imaging CT CAP: a 4.7cm mass in LUL lung and a 2.1cm mas in RML, and a small nodule in RUL, suspecious for metastasis     07/09/2014 Initial Diagnosis right IDA with b/l lung lesions, ER-/PR-/HER2+   07/09/2014 Initial Biopsy US guided right breast mass and axillary node biopsy showed IDA, and DCIS, ER-/PR-/HER2+    INTERVAL HISTROY   Angelic returns for follow-up and at discussed her recent left lung mass biopsy results.  No new complaints since last visit. She is quite anxious to get treatment started.  MEDICAL HISTORY:  Past Medical History  Diagnosis Date  . Breast cancer   . Pneumonia     hx of pneumonia 08/2013   . GERD (gastroesophageal reflux disease)     during pregnancy     SURGICAL HISTORY: Past Surgical History  Procedure Laterality Date  . Cesarean section    . Essure tubal ligation    . Cholecystectomy    . Wisdom tooth extraction    . Portacath placement Left 07/21/2014    Procedure: INSERTION PORT-A-CATH;  Surgeon: Stark Klein, MD;  Location: [facility];  Service: General;  Laterality: Left;    SOCIAL HISTORY: History   Social History  . Marital Status: Married     Spouse Name: N/A    Number of Children:  she has 2 daughters at the age of 70 and 68.   . Years of Education: N/A   Occupational History  .  works as Freight forwarder for a [employer].    Social History Main Topics  . Smoking status: Never Smoker   . Smokeless tobacco: Not on file  . Alcohol Use: Yes  . Drug Use: No  . Sexual Activity: No    FAMILY HISTORY: Family History  Problem Relation Age of Onset  . Breast cancer Mother 74  . Liver cancer Father 23  . Breast cancer Maternal Aunt 36  . Prostate cancer Maternal Grandfather   . Liver cancer Paternal Grandmother   . Prostate cancer Paternal Grandfather       ALLERGIES:  is allergic to aspirin and codeine.  MEDICATIONS:  Current Outpatient Prescriptions  Medication Sig Dispense Refill  . albuterol (PROVENTIL HFA;VENTOLIN HFA) 108 (90 BASE) MCG/ACT inhaler Inhale 2 puffs into the lungs every 4 (four) hours as needed for wheezing or shortness of breath.    . Camphor-Eucalyptus-Menthol (VICKS VAPORUB EX) Apply 1 application topically at bedtime as needed (sinus). Applies under nose, throat and on chest.    . cetirizine (ZYRTEC) 10 MG tablet Take 10 mg by mouth daily as needed for allergies.     . metaxalone (SKELAXIN) 800 MG tablet Take 800 mg by mouth 3 (three) times daily as needed  for muscle spasms.    . Neomycin-Bacitracin-Polymyxin (TRIPLE ANTIBIOTIC EX) Apply 1 application topically 2 (two) times daily as needed (eczema on hands.).    Marland Kitchen oxyCODONE-acetaminophen (ROXICET) 5-325 MG per tablet Take 1-2 tablets by mouth every 4 (four) hours as needed for severe pain. 30 tablet 0  . Pseudoeph-Doxylamine-DM-APAP (NYQUIL PO) Take 30 mLs by mouth 2 (two) times daily as needed (cold/allergies).     Marland Kitchen ibuprofen (ADVIL,MOTRIN) 200 MG tablet Take 400-800 mg by mouth every 6 (six) hours as needed for headache or moderate pain.    Marland Kitchen ibuprofen (ADVIL,MOTRIN) 800 MG tablet Take 800 mg by mouth as needed.  0  . lidocaine-prilocaine  (EMLA) cream Apply 1 application topically as needed. Apply to portacath 1 1/2  -  2  Hours prior to procedures as needed. 30 g 1  . ondansetron (ZOFRAN) 8 MG tablet Take 1 tablet (8 mg total) by mouth every 8 (eight) hours as needed for nausea or vomiting. 30 tablet 3   No current facility-administered medications for this visit.    REVIEW OF SYSTEMS:   Constitutional: Denies fevers, chills or abnormal night sweats, (+) malaise  Eyes: Denies blurriness of vision, double vision or watery eyes Ears, nose, mouth, throat, and face: Denies mucositis or sore throat Respiratory: (+) dry cough, no dyspnea or wheezes Cardiovascular: Denies palpitation, chest discomfort or lower extremity swelling Gastrointestinal:  Denies nausea, heartburn or change in bowel habits Skin: Denies abnormal skin rashes Lymphatics: Denies new lymphadenopathy or easy bruising Neurological:Denies numbness, tingling or new weaknesses Behavioral/Psych: Mood is stable, no new changes  All other systems were reviewed with the patient and are negative.  PHYSICAL EXAMINATION: ECOG PERFORMANCE STATUS: 0 - Asymptomatic  Filed Vitals:   07/28/14 1533  BP: 155/88  Pulse: 95  Temp: 98.7 F (37.1 C)  Resp: 19   Filed Weights   07/28/14 1533  Weight: 215 lb 3.2 oz (97.614 kg)    GENERAL:alert, no distress and comfortable SKIN: skin color, texture, turgor are normal, no rashes or significant lesions EYES: normal, conjunctiva are pink and non-injected, sclera clear OROPHARYNX:no exudate, no erythema and lips, buccal mucosa, and tongue normal  NECK: supple, thyroid normal size, non-tender, without nodularity LYMPH:  no palpable lymphadenopathy in the cervical, axillary or inguinal LUNGS: clear to auscultation and percussion with normal breathing effort HEART: regular rate & rhythm and no murmurs and no lower extremity edema ABDOMEN:abdomen soft, non-tender and normal bowel sounds Musculoskeletal:no cyanosis of digits  and no clubbing  PSYCH: alert & oriented x 3 with fluent speech NEURO: no focal motor/sensory deficits Breasts: Breast inspection showed them to be symmetrical with no skin change or nipple discharge. Palpation of the right breast showed a 5 x 7 cm firm mass in the left or in her lower quadrant and a 3 x 5 cm firm node in right axilla. Left breast  and axilla revealed no obvious mass that I could appreciate.   LABORATORY DATA:  I have reviewed the data as listed Lab Results  Component Value Date   WBC 8.1 07/26/2014   HGB 14.1 07/26/2014   HCT 41.6 07/26/2014   MCV 86.8 07/26/2014   PLT 273 07/26/2014    Recent Labs  07/14/14 1251  NA 139  K 4.0  CO2 26  GLUCOSE 73  BUN 10.9  CREATININE 0.9  CALCIUM 9.9  PROT 8.4*  ALBUMIN 3.8  AST 23  ALT 21  ALKPHOS 79  BILITOT 0.36   PATHOLOGY REPORT 07/09/2014 #1  breast, right needle core biopsy more anterior -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 45)  Lung, biopsy, Left 07/26/2014 - HIGH GRADE CARCINOMA, SEE COMMENT. Microscopic Comment The carcinoma demonstrates the following immunophenotype: TTF-1 - negative expression. Napsin A- negative expression. CK5/6 - focal, moderate expression. estrogen receptor - negative expression. GCDFP- negative expression. The recent breast biopsy demonstrating Her2 amplified high grade invasive mammary carcinoma is noted ([id number]). Although the immunophenotype of the current case is non-sepcific, it strongly argues against primary lung adenocarcinoma. However, on re-review, the morphology of the current case is essentially identical to the primary mammary carcinoma. In lieu of further immunophenotyping, the tumor will be submitted for Her2 testing and the remaining  tissue will be reserved for additional ancillary tumor testing. The results of the Her2 testing will be reported in an addendum. The case was discussed with Dr Burr Medico on 07/28/2015 and 07/29/2015   RADIOGRAPHIC STUDIES: CT CAP 07/20/2014 IMPRESSION: 1. Right breast mass is compatible with known malignancy. Adjacent soft tissue masses may represent satellite lesions. Recommend correlation with mammography, ultrasound or MRI. 2. Left upper lobe, right middle lobe and bilateral lower lobe pulmonary masses/nodules are concerning for metastatic disease. 3. Right axillary and left hilar adenopathy are concerning for metastatic disease. 4. 3 mm nonobstructing right renal stone.   ECHO 12/8 Impressions: - Normal biventricular size and systolic function. Impaired relaxation with normal filling pressures. Mild tricupsid regurgitation with normal RVSP.  ASSESSMENT & PLAN:  44 year old African-American female, premenopausal, without significant past medical history, presented with palpable right breast mass and right axillary mass.   1. 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 I discussed the pathology findings with pathologist Dr. Donato Heinz yesterday and today. It showed high-grade carcinoma, the morphology is similar to her prior breast biopsy. Primary lung cancer is unlikely given the negative TTF-1 and Napsin A. the HER-2 CISH is pending. If it is positive, he will confirm breast metastasis. -She has a bone scan scheduled this Friday. -I discussed the first-line chemotherapy with patient and her family members. I recommend weekly paclitaxel and trastuzumab,  and pertuzumab every 3 weeks. I'll evaluate her response to the treatment in 2-3 months by a restaging CT scan, I will likely to continue the this regiment for 6-9 months. If she has excellent response, then I'll change to Herceptin  maintenance therapy. -Potential side effects of the above chemotherapy agents work spent to patient and her family members in great detail, written material was given to her. She also participated the chemotherapy class. I called in Zofran to her pharmacy today today. Chemotherapy consent was obtained during her visit today. -She had normal echo at baseline, we'll repeat her echo  every 3 months when she is on and HER-2 therapy.  Plan #1 I'll tentatively schedule her to start chemotherapy next Monday or Tuesday. #2 lab with CBC and CMP before each treatment. #3 I'll call her with her tumor HER-2 results and bone scan results in the next few days. I plan to see her back before her second treatment.  Patient and her family members had a many questions, I answered to their satisfaction.  I spent about 20 minutes counseling the patient and her family members, total care was  about 30 minutes.  [name]  07/28/2014

## 2014-07-29 ENCOUNTER — Telehealth: Payer: Self-pay | Admitting: *Deleted

## 2014-07-29 NOTE — Telephone Encounter (Signed)
[facility] faxed Prior authorization request for Ondansetron.  Request to Managed Care for review.

## 2014-07-30 ENCOUNTER — Encounter (HOSPITAL_COMMUNITY): Admission: RE | Payer: Self-pay | Source: Ambulatory Visit

## 2014-07-30 ENCOUNTER — Encounter (HOSPITAL_COMMUNITY)
Admission: RE | Admit: 2014-07-30 | Discharge: 2014-07-30 | Disposition: A | Discharge status: Home / Self Care | Payer: BC Managed Care – PPO | Source: Ambulatory Visit | Attending: Hematology | Admitting: Hematology

## 2014-07-30 ENCOUNTER — Ambulatory Visit: Payer: BC Managed Care – PPO | Admitting: Hematology

## 2014-07-30 ENCOUNTER — Encounter (HOSPITAL_COMMUNITY): Payer: BC Managed Care – PPO

## 2014-07-30 ENCOUNTER — Ambulatory Visit (HOSPITAL_COMMUNITY)
Admission: RE | Admit: 2014-07-30 | Payer: BC Managed Care – PPO | Source: Ambulatory Visit | Admitting: Cardiothoracic Surgery

## 2014-07-30 DIAGNOSIS — C50311 Malignant neoplasm of lower-inner quadrant of right female breast: Secondary | ICD-10-CM | POA: Insufficient documentation

## 2014-07-30 SURGERY — VIDEO BRONCHOSCOPY WITH ENDOBRONCHIAL NAVIGATION
Anesthesia: General

## 2014-07-30 MED ORDER — TECHNETIUM TC 99M MEDRONATE IV KIT
26.0000 | PACK | Freq: Once | INTRAVENOUS | Status: AC | PRN
  Administered 2014-07-30: 26 via INTRAVENOUS

## 2014-07-30 NOTE — Telephone Encounter (Addendum)
[facility] [phone] spoke to [place] and her ondansetron 8 mg has been approved from 06/30/14 to 01/26/15. Called [facility] [phone] spoke to [place].  #2 [place] and she said she only has to get the ondansetron if she needs it.  She also inquired about her Bone Scan and I told her I would have the nurse check and get back to her.

## 2014-08-01 ENCOUNTER — Emergency Department (HOSPITAL_BASED_OUTPATIENT_CLINIC_OR_DEPARTMENT_OTHER): Payer: BC Managed Care – PPO

## 2014-08-01 ENCOUNTER — Encounter (HOSPITAL_BASED_OUTPATIENT_CLINIC_OR_DEPARTMENT_OTHER): Payer: Self-pay | Admitting: *Deleted

## 2014-08-01 ENCOUNTER — Emergency Department (HOSPITAL_BASED_OUTPATIENT_CLINIC_OR_DEPARTMENT_OTHER)
Admission: EM | Admit: 2014-08-01 | Discharge: 2014-08-02 | Disposition: A | Discharge status: Home / Self Care | Payer: BC Managed Care – PPO | Attending: Emergency Medicine | Admitting: Emergency Medicine

## 2014-08-01 DIAGNOSIS — C50919 Malignant neoplasm of unspecified site of unspecified female breast: Secondary | ICD-10-CM | POA: Diagnosis not present

## 2014-08-01 DIAGNOSIS — R05 Cough: Secondary | ICD-10-CM | POA: Insufficient documentation

## 2014-08-01 DIAGNOSIS — R053 Chronic cough: Secondary | ICD-10-CM

## 2014-08-01 DIAGNOSIS — Z8701 Personal history of pneumonia (recurrent): Secondary | ICD-10-CM | POA: Insufficient documentation

## 2014-08-01 DIAGNOSIS — C7800 Secondary malignant neoplasm of unspecified lung: Secondary | ICD-10-CM | POA: Insufficient documentation

## 2014-08-01 DIAGNOSIS — M940 Chondrocostal junction syndrome [Tietze]: Secondary | ICD-10-CM | POA: Diagnosis not present

## 2014-08-01 DIAGNOSIS — R059 Cough, unspecified: Secondary | ICD-10-CM

## 2014-08-01 DIAGNOSIS — Z8719 Personal history of other diseases of the digestive system: Secondary | ICD-10-CM | POA: Insufficient documentation

## 2014-08-01 MED ORDER — BENZONATATE 100 MG PO CAPS
100.0000 mg | ORAL_CAPSULE | Freq: Once | ORAL | Status: DC
  Filled 2014-08-01: qty 1

## 2014-08-01 NOTE — ED Provider Notes (Signed)
CSN: [id number]     Arrival date & time 08/01/14  2120 History   First MD Initiated Contact with Patient 08/01/14 2331     Chief Complaint  Patient presents with  . URI     (Consider location/radiation/quality/duration/timing/severity/associated sxs/prior Treatment) HPI   43 year old female with history of stage 4 breast cancer, recurrent pneumonia, GERD who presents complaining of cough and pleuritic chest pain. Patient states she was diagnosed with stage IV breast cancer with metastatic to lung on Thanksgiving. After the diagnosis she developed a dry cough that has been persistence until now. Her cough is throughout the day and she did report some nasal congestion as well. Since yesterday she developed sharp pain to her right side of chest worsening with cough, laugh, and with movement. She denies having any fever, hemoptysis, shortness of breath, or rash. She denies having any abdominal pain nausea vomiting or diarrhea. She is scheduled to begin chemotherapy in 2 days. She does have a port-a-cath to her left chest.  Her oncologist is Dr. Burr Medico at [facility].    Past Medical History  Diagnosis Date  . Breast cancer   . Pneumonia     hx of pneumonia 08/2013   . GERD (gastroesophageal reflux disease)     during pregnancy   . Breast cancer    Past Surgical History  Procedure Laterality Date  . Cesarean section    . Essure tubal ligation    . Cholecystectomy    . Wisdom tooth extraction    . Portacath placement Left 07/21/2014    Procedure: INSERTION PORT-A-CATH;  Surgeon: Stark Klein, MD;  Location: [facility];  Service: General;  Laterality: Left;   Family History  Problem Relation Age of Onset  . Breast cancer Mother 62  . Liver cancer Father   . Breast cancer Maternal Aunt 79  . Prostate cancer Maternal Grandfather 10  . Liver cancer Paternal Grandmother   . Prostate cancer Paternal Grandfather   . Prostate cancer Maternal Uncle 73  . Breast cancer Maternal Aunt 5  . Prostate cancer  Maternal Uncle 68  . Breast cancer Cousin 36    mat first cousin   History  Substance Use Topics  . Smoking status: Never Smoker   . Smokeless tobacco: Never Used  . Alcohol Use: Yes     Comment: 2-3 drinks per week    OB History    No data available     Review of Systems  All other systems reviewed and are negative.     Allergies  Aspirin and Codeine  Home Medications   Prior to Admission medications   Medication Sig Start Date End Date Taking? Authorizing Provider  albuterol (PROVENTIL HFA;VENTOLIN HFA) 108 (90 BASE) MCG/ACT inhaler Inhale 2 puffs into the lungs every 4 (four) hours as needed for wheezing or shortness of breath.    Historical Provider, MD  Camphor-Eucalyptus-Menthol (VICKS VAPORUB EX) Apply 1 application topically at bedtime as needed (sinus). Applies under nose, throat and on chest.    Historical Provider, MD  cetirizine (ZYRTEC) 10 MG tablet Take 10 mg by mouth daily as needed for allergies.     Historical Provider, MD  ibuprofen (ADVIL,MOTRIN) 200 MG tablet Take 400-800 mg by mouth every 6 (six) hours as needed for headache or moderate pain.    Historical Provider, MD  ibuprofen (ADVIL,MOTRIN) 800 MG tablet Take 800 mg by mouth as needed. 07/01/14   Historical Provider, MD  lidocaine-prilocaine (EMLA) cream Apply 1 application topically as needed. Apply  to portacath 1 1/2  -  2  Hours prior to procedures as needed. 07/28/14   [name], MD  metaxalone (SKELAXIN) 800 MG tablet Take 800 mg by mouth 3 (three) times daily as needed for muscle spasms.    Historical Provider, MD  Neomycin-Bacitracin-Polymyxin (TRIPLE ANTIBIOTIC EX) Apply 1 application topically 2 (two) times daily as needed (eczema on hands.).    Historical Provider, MD  ondansetron (ZOFRAN) 8 MG tablet Take 1 tablet (8 mg total) by mouth every 8 (eight) hours as needed for nausea or vomiting. 07/28/14   [name], MD  oxyCODONE-acetaminophen (ROXICET) 5-325 MG per tablet Take 1-2 tablets by mouth  every 4 (four) hours as needed for severe pain. 07/21/14   Stark Klein, MD  Pseudoeph-Doxylamine-DM-APAP (NYQUIL PO) Take 30 mLs by mouth 2 (two) times daily as needed (cold/allergies).     Historical Provider, MD   BP 160/90 mmHg  Pulse 87  Temp(Src) 98.7 F (37.1 C) (Oral)  Resp 18  Ht 5\' 10"  (1.778 m)  Wt 215 lb (97.523 kg)  BMI 30.85 kg/m2  SpO2 100%  LMP 07/18/2014 Physical Exam  Constitutional: She is oriented to person, place, and time. She appears well-developed and well-nourished. No distress.  HENT:  Head: Atraumatic.  Mouth/Throat: Oropharynx is clear and moist.  Eyes: Conjunctivae are normal.  Neck: Normal range of motion. Neck supple. No JVD present.  Cardiovascular: Normal rate and regular rhythm.   Pulmonary/Chest: Effort normal. No respiratory distress. She has no wheezes. She has rales (Faint crackles heard at the right side of the lung.). She exhibits tenderness (point tenderness to right anterior lateral aspects of ribs to palpation without any crepitus, emphysema, or overlying skin changes.).  Abdominal: Soft. There is no tenderness.  Neurological: She is alert and oriented to person, place, and time.  Skin: No rash noted.  Psychiatric: She has a normal mood and affect.  Nursing note and vitals reviewed.   ED Course  Procedures (including critical care time)  11:45 PM Patient with persistent cough for a month and now having pleuritic pain and chest wall pain likely secondary to costochondritis. She is afebrile, no hypoxia. She was recently diagnosed with stage IV breast cancer with metastatic to lung and will be receiving chemotherapy in 2 days. She is currently mentating appropriately and in no acute distress. Chest x-ray ordered. Cough medication given.  Care discussed with Dr. Florina Ou.   12:02 AM CXR showing mass in lung, with increase in size however no focal infiltrates to suggest pneumonia.  Cough medication prescribed.  Suspect costochondritis.  Pt will  f/u with oncologist for further care.  Low suspicion for PE as pt is not hypoxic and no c/o SOB.   Labs Review Labs Reviewed - No data to display  Imaging Review Dg Chest 2 View  08/01/2014   CLINICAL DATA:  Recent diagnosis of breast cancer with pulmonary metastases. Acute onset of right-sided chest pain and cough. Initial encounter.  EXAM: CHEST  2 VIEW  COMPARISON:  Chest radiograph performed 07/26/2014, and CT of the chest performed 07/20/2014  FINDINGS: Bilateral pulmonary masses and nodules are again seen, larger on the left. Mild vascular congestion is noted. No superimposed airspace consolidation is noted. No pleural effusion or pneumothorax is seen.  The cardiomediastinal silhouette is normal in size. A left-sided chest port is noted ending about the mid to distal SVC. No acute osseous abnormalities are identified. Clips are noted within the right upper quadrant, reflecting prior cholecystectomy.  IMPRESSION: Bilateral  pulmonary masses and nodules again seen, larger on the left. Mild vascular congestion noted. No superimposed airspace consolidation seen at this time.   Electronically Signed   By: Garald Balding M.D.   On: 08/01/2014 23:49     EKG Interpretation None      MDM   Final diagnoses:  Persistent cough for 3 weeks or longer  Acute costochondritis    BP 160/90 mmHg  Pulse 87  Temp(Src) 98.7 F (37.1 C) (Oral)  Resp 18  Ht 5\' 10"  (1.778 m)  Wt 215 lb (97.523 kg)  BMI 30.85 kg/m2  SpO2 100%  LMP 07/18/2014  I have reviewed nursing notes and vital signs. I personally reviewed the imaging tests through PACS system  I reviewed available ER/hospitalization records thought the EMR    Domenic Moras, PA-C 08/02/14 [place], MD 08/02/14 [phone]

## 2014-08-01 NOTE — ED Notes (Signed)
Pt c/o URI symptoms  x 1 month increased coughing with right rib pain

## 2014-08-02 MED ORDER — BENZONATATE 100 MG PO CAPS: 100.0000 mg | ORAL_CAPSULE | Freq: Three times a day (TID) | ORAL | Status: DC | PRN

## 2014-08-02 NOTE — Discharge Instructions (Signed)
Your persistent cough may have inflamed your ribs muscle causing pain.  Take cough medication as needed.  Follow up closely with your oncologist for further management of your cancer.  Return if you develop fever, cough productive with blood, or having shortness of breath.  Costochondritis Costochondritis, sometimes called Tietze syndrome, is a swelling and irritation (inflammation) of the tissue (cartilage) that connects your ribs with your breastbone (sternum). It causes pain in the chest and rib area. Costochondritis usually goes away on its own over time. It can take up to 6 weeks or longer to get better, especially if you are unable to limit your activities. CAUSES  Some cases of costochondritis have no known cause. Possible causes include:  Injury (trauma).  Exercise or activity such as lifting.  Severe coughing. SIGNS AND SYMPTOMS  Pain and tenderness in the chest and rib area.  Pain that gets worse when coughing or taking deep breaths.  Pain that gets worse with specific movements. DIAGNOSIS  Your health care provider will do a physical exam and ask about your symptoms. Chest X-rays or other tests may be done to rule out other problems. TREATMENT  Costochondritis usually goes away on its own over time. Your health care provider may prescribe medicine to help relieve pain. HOME CARE INSTRUCTIONS   Avoid exhausting physical activity. Try not to strain your ribs during normal activity. This would include any activities using chest, abdominal, and side muscles, especially if heavy weights are used.  Apply ice to the affected area for the first 2 days after the pain begins.  Put ice in a plastic bag.  Place a towel between your skin and the bag.  Leave the ice on for 20 minutes, 2-3 times a day.  Only take over-the-counter or prescription medicines as directed by your health care provider. SEEK MEDICAL CARE IF:  You have redness or swelling at the rib joints. These are signs  of infection.  Your pain does not go away despite rest or medicine. SEEK IMMEDIATE MEDICAL CARE IF:   Your pain increases or you are very uncomfortable.  You have shortness of breath or difficulty breathing.  You cough up blood.  You have worse chest pains, sweating, or vomiting.  You have a fever or persistent symptoms for more than 2-3 days.  You have a fever and your symptoms suddenly get worse. MAKE SURE YOU:   Understand these instructions.  Will watch your condition.  Will get help right away if you are not doing well or get worse. Document Released: 05/09/2005 Document Revised: 05/20/2013 Document Reviewed: 03/03/2013 [facility] Patient Information 2015 [place], [place]. This information is not intended to replace advice given to you by your health care provider. Make sure you discuss any questions you have with your health care provider.

## 2014-08-03 ENCOUNTER — Other Ambulatory Visit: Payer: Self-pay | Admitting: Nurse Practitioner

## 2014-08-03 ENCOUNTER — Ambulatory Visit (HOSPITAL_BASED_OUTPATIENT_CLINIC_OR_DEPARTMENT_OTHER): Payer: BC Managed Care – PPO

## 2014-08-03 ENCOUNTER — Ambulatory Visit (HOSPITAL_BASED_OUTPATIENT_CLINIC_OR_DEPARTMENT_OTHER): Payer: BC Managed Care – PPO | Admitting: Nurse Practitioner

## 2014-08-03 DIAGNOSIS — C50311 Malignant neoplasm of lower-inner quadrant of right female breast: Secondary | ICD-10-CM

## 2014-08-03 DIAGNOSIS — R509 Fever, unspecified: Secondary | ICD-10-CM

## 2014-08-03 DIAGNOSIS — C7801 Secondary malignant neoplasm of right lung: Secondary | ICD-10-CM

## 2014-08-03 DIAGNOSIS — C50911 Malignant neoplasm of unspecified site of right female breast: Secondary | ICD-10-CM

## 2014-08-03 DIAGNOSIS — C7800 Secondary malignant neoplasm of unspecified lung: Principal | ICD-10-CM

## 2014-08-03 DIAGNOSIS — Z5112 Encounter for antineoplastic immunotherapy: Secondary | ICD-10-CM

## 2014-08-03 LAB — CBC WITH DIFFERENTIAL/PLATELET
BASO%: 0.3 % (ref 0.0–2.0)
BASOS ABS: 0 10*3/uL (ref 0.0–0.1)
EOS%: 2.2 % (ref 0.0–7.0)
Eosinophils Absolute: 0.2 10*3/uL (ref 0.0–0.5)
HCT: 37.1 % (ref 34.8–46.6)
HEMOGLOBIN: 12.1 g/dL (ref 11.6–15.9)
LYMPH%: 29.9 % (ref 14.0–49.7)
MCH: 28.3 pg (ref 25.1–34.0)
MCHC: 32.6 g/dL (ref 31.5–36.0)
MCV: 86.9 fL (ref 79.5–101.0)
MONO#: 0.7 10*3/uL (ref 0.1–0.9)
MONO%: 7.9 % (ref 0.0–14.0)
NEUT#: 5.1 10*3/uL (ref 1.5–6.5)
NEUT%: 59.7 % (ref 38.4–76.8)
PLATELETS: 257 10*3/uL (ref 145–400)
RBC: 4.27 10*6/uL (ref 3.70–5.45)
RDW: 13.1 % (ref 11.2–14.5)
WBC: 8.6 10*3/uL (ref 3.9–10.3)
lymph#: 2.6 10*3/uL (ref 0.9–3.3)

## 2014-08-03 LAB — COMPREHENSIVE METABOLIC PANEL (CC13)
ALT: 17 U/L (ref 0–55)
AST: 19 U/L (ref 5–34)
Albumin: 3.5 g/dL (ref 3.5–5.0)
Alkaline Phosphatase: 71 U/L (ref 40–150)
Anion Gap: 9 mEq/L (ref 3–11)
BILIRUBIN TOTAL: 0.3 mg/dL (ref 0.20–1.20)
BUN: 11.3 mg/dL (ref 7.0–26.0)
CHLORIDE: 105 meq/L (ref 98–109)
CO2: 27 mEq/L (ref 22–29)
CREATININE: 0.9 mg/dL (ref 0.6–1.1)
Calcium: 9.3 mg/dL (ref 8.4–10.4)
EGFR: >90 mL/min/{1.73_m2} (ref >90)
Glucose: 95 mg/dl (ref 70–140)
Potassium: 3.9 mEq/L (ref 3.5–5.1)
Sodium: 141 mEq/L (ref 136–145)
TOTAL PROTEIN: 7.4 g/dL (ref 6.4–8.3)

## 2014-08-03 MED ORDER — SODIUM CHLORIDE 0.9 % IJ SOLN
10.0000 mL | INTRAMUSCULAR | Status: DC | PRN
  Administered 2014-08-03: 10 mL
  Filled 2014-08-03: qty 10

## 2014-08-03 MED ORDER — DIPHENHYDRAMINE HCL 50 MG/ML IJ SOLN
INTRAMUSCULAR | Status: AC
  Filled 2014-08-03: qty 1

## 2014-08-03 MED ORDER — SODIUM CHLORIDE 0.9 % IV SOLN
Freq: Once | INTRAVENOUS | Status: AC
  Administered 2014-08-03: 13:00:00 via INTRAVENOUS

## 2014-08-03 MED ORDER — DIPHENHYDRAMINE HCL 50 MG/ML IJ SOLN
50.0000 mg | Freq: Once | INTRAMUSCULAR | Status: AC
  Administered 2014-08-03: 50 mg via INTRAVENOUS

## 2014-08-03 MED ORDER — DEXAMETHASONE SODIUM PHOSPHATE 20 MG/5ML IJ SOLN
INTRAMUSCULAR | Status: AC
  Filled 2014-08-03: qty 5

## 2014-08-03 MED ORDER — SODIUM CHLORIDE 0.9 % IV SOLN
840.0000 mg | Freq: Once | INTRAVENOUS | Status: AC
  Administered 2014-08-03: 840 mg via INTRAVENOUS
  Filled 2014-08-03: qty 28

## 2014-08-03 MED ORDER — HEPARIN SOD (PORK) LOCK FLUSH 100 UNIT/ML IV SOLN
500.0000 [IU] | Freq: Once | INTRAVENOUS | Status: AC | PRN
  Administered 2014-08-03: 500 [IU]
  Filled 2014-08-03: qty 5

## 2014-08-03 MED ORDER — TRASTUZUMAB CHEMO INJECTION 440 MG
8.0000 mg/kg | Freq: Once | INTRAVENOUS | Status: AC
  Administered 2014-08-03: 777 mg via INTRAVENOUS
  Filled 2014-08-03: qty 37

## 2014-08-03 MED ORDER — ACETAMINOPHEN 325 MG PO TABS
650.0000 mg | ORAL_TABLET | Freq: Once | ORAL | Status: AC
  Administered 2014-08-03: 650 mg via ORAL

## 2014-08-03 MED ORDER — FAMOTIDINE IN NACL 20-0.9 MG/50ML-% IV SOLN: 20.0000 mg | Freq: Once | INTRAVENOUS | Status: DC

## 2014-08-03 MED ORDER — ONDANSETRON 8 MG/50ML IVPB (CHCC): 8.0000 mg | Freq: Once | INTRAVENOUS | Status: DC

## 2014-08-03 MED ORDER — DEXAMETHASONE SODIUM PHOSPHATE 20 MG/5ML IJ SOLN: 20.0000 mg | Freq: Once | INTRAMUSCULAR | Status: DC

## 2014-08-03 MED ORDER — ONDANSETRON 8 MG/NS 50 ML IVPB
INTRAVENOUS | Status: AC
  Filled 2014-08-03: qty 8

## 2014-08-03 MED ORDER — ACETAMINOPHEN 325 MG PO TABS
ORAL_TABLET | ORAL | Status: AC
  Filled 2014-08-03: qty 2

## 2014-08-03 MED ORDER — FAMOTIDINE IN NACL 20-0.9 MG/50ML-% IV SOLN
INTRAVENOUS | Status: AC
  Filled 2014-08-03: qty 50

## 2014-08-03 MED ORDER — SODIUM CHLORIDE 0.9 % IV SOLN: Freq: Once | INTRAVENOUS | Status: AC

## 2014-08-03 MED ORDER — PACLITAXEL CHEMO INJECTION 300 MG/50ML
80.0000 mg/m2 | Freq: Once | INTRAVENOUS | Status: DC
  Filled 2014-08-03: qty 29

## 2014-08-03 NOTE — Progress Notes (Signed)
1655: Pt reports worsening chills and states she feels like she is shaking. Pt tearful at this time. Vital signs stable with slightly elevated temp and HR.  Herceptin paused and [name], NP at bedside to assess patient.  Infusion restarted at 1700. Will continue to monitor patient closely.  1730: Pt completed and tolerated first time herceptin.  Per [name], NP and Montel Clock, pharmacist we will hold pre meds and first time taxol and give tomorrow.  Pt aware and agreeable with this plan and will return at [place] on 08/04/14 to receive taxol infusion.

## 2014-08-03 NOTE — Patient Instructions (Addendum)
[place] Discharge Instructions for Patients Receiving Chemotherapy  Today you received the following chemotherapy agents Herceptin, Perjeta  To help prevent nausea and vomiting after your treatment, we encourage you to take your nausea medication as prescribed/directed.     If you develop nausea and vomiting that is not controlled by your nausea medication, call the clinic.   BELOW ARE SYMPTOMS THAT SHOULD BE REPORTED IMMEDIATELY:  *FEVER GREATER THAN 100.5 F  *CHILLS WITH OR WITHOUT FEVER  NAUSEA AND VOMITING THAT IS NOT CONTROLLED WITH YOUR NAUSEA MEDICATION  *UNUSUAL SHORTNESS OF BREATH  *UNUSUAL BRUISING OR BLEEDING  TENDERNESS IN MOUTH AND THROAT WITH OR WITHOUT PRESENCE OF ULCERS  *URINARY PROBLEMS  *BOWEL PROBLEMS  UNUSUAL RASH Items with * indicate a potential emergency and should be followed up as soon as possible.  Feel free to call the clinic you have any questions or concerns. The clinic phone number is [phone].

## 2014-08-04 ENCOUNTER — Encounter: Payer: Self-pay | Admitting: Nurse Practitioner

## 2014-08-04 ENCOUNTER — Ambulatory Visit (HOSPITAL_BASED_OUTPATIENT_CLINIC_OR_DEPARTMENT_OTHER): Payer: BC Managed Care – PPO

## 2014-08-04 ENCOUNTER — Telehealth: Payer: Self-pay | Admitting: Nurse Practitioner

## 2014-08-04 DIAGNOSIS — R509 Fever, unspecified: Secondary | ICD-10-CM | POA: Insufficient documentation

## 2014-08-04 DIAGNOSIS — C50311 Malignant neoplasm of lower-inner quadrant of right female breast: Secondary | ICD-10-CM

## 2014-08-04 DIAGNOSIS — C7800 Secondary malignant neoplasm of unspecified lung: Principal | ICD-10-CM

## 2014-08-04 DIAGNOSIS — C50911 Malignant neoplasm of unspecified site of right female breast: Secondary | ICD-10-CM

## 2014-08-04 DIAGNOSIS — Z5111 Encounter for antineoplastic chemotherapy: Secondary | ICD-10-CM

## 2014-08-04 DIAGNOSIS — C7801 Secondary malignant neoplasm of right lung: Secondary | ICD-10-CM

## 2014-08-04 MED ORDER — DEXAMETHASONE SODIUM PHOSPHATE 20 MG/5ML IJ SOLN
20.0000 mg | Freq: Once | INTRAMUSCULAR | Status: AC
  Administered 2014-08-04: 20 mg via INTRAVENOUS

## 2014-08-04 MED ORDER — DEXAMETHASONE SODIUM PHOSPHATE 20 MG/5ML IJ SOLN
INTRAMUSCULAR | Status: AC
  Filled 2014-08-04: qty 5

## 2014-08-04 MED ORDER — HEPARIN SOD (PORK) LOCK FLUSH 100 UNIT/ML IV SOLN
500.0000 [IU] | Freq: Once | INTRAVENOUS | Status: DC | PRN
  Filled 2014-08-04: qty 5

## 2014-08-04 MED ORDER — FAMOTIDINE IN NACL 20-0.9 MG/50ML-% IV SOLN
20.0000 mg | Freq: Once | INTRAVENOUS | Status: AC
  Administered 2014-08-04: 20 mg via INTRAVENOUS

## 2014-08-04 MED ORDER — DIPHENHYDRAMINE HCL 50 MG/ML IJ SOLN
50.0000 mg | Freq: Once | INTRAMUSCULAR | Status: AC
  Administered 2014-08-04: 50 mg via INTRAVENOUS

## 2014-08-04 MED ORDER — SODIUM CHLORIDE 0.9 % IJ SOLN
10.0000 mL | INTRAMUSCULAR | Status: DC | PRN
  Filled 2014-08-04: qty 10

## 2014-08-04 MED ORDER — PACLITAXEL CHEMO INJECTION 300 MG/50ML
80.0000 mg/m2 | Freq: Once | INTRAVENOUS | Status: AC
  Administered 2014-08-04: 174 mg via INTRAVENOUS
  Filled 2014-08-04: qty 29

## 2014-08-04 MED ORDER — ONDANSETRON 8 MG/50ML IVPB (CHCC)
8.0000 mg | Freq: Once | INTRAVENOUS | Status: AC
  Administered 2014-08-04: 8 mg via INTRAVENOUS

## 2014-08-04 MED ORDER — DIPHENHYDRAMINE HCL 50 MG/ML IJ SOLN
INTRAMUSCULAR | Status: AC
  Filled 2014-08-04: qty 1

## 2014-08-04 MED ORDER — SODIUM CHLORIDE 0.9 % IV SOLN
Freq: Once | INTRAVENOUS | Status: AC
  Administered 2014-08-04: 09:00:00 via INTRAVENOUS

## 2014-08-04 MED ORDER — FAMOTIDINE IN NACL 20-0.9 MG/50ML-% IV SOLN
INTRAVENOUS | Status: AC
  Filled 2014-08-04: qty 50

## 2014-08-04 MED ORDER — ONDANSETRON 8 MG/NS 50 ML IVPB
INTRAVENOUS | Status: AC
  Filled 2014-08-04: qty 8

## 2014-08-04 NOTE — Assessment & Plan Note (Signed)
Patient presented to the [facility] today to receive her first cycle of paclitaxel/Herceptin/Perjeta.  She completed the Perjeta portion of her therapy with no difficulty; but did experience a low-grade fever of 99.4 just prior to completing the Herceptin infusion.  She developed some mild chills; and became anxious/tearful.  She had no riders on exam.  She also denied any scratchy throat or airway issues.  She had no rash whatsoever.  Confirmed patient did receive premedications of both Tylenol 650 mg and Benadryl 50 mg.  Herceptin infusion was held for a brief amount of time; and patient monitor closely.  All chills did resolve; and patient was able to complete the Herceptin portion of her therapy.

## 2014-08-04 NOTE — Telephone Encounter (Signed)
Per 12/23 POF added NP/CB symptom management visit 45 mins, s/w Monica to arrive pt this was on 12/22 when pt was seen..... KJ

## 2014-08-04 NOTE — Patient Instructions (Signed)
[facility] Discharge Instructions for Patients Receiving Chemotherapy  Today you received the following chemotherapy agents Taxol  To help prevent nausea and vomiting after your treatment, we encourage you to take your nausea medication  As directed   If you develop nausea and vomiting that is not controlled by your nausea medication, call the clinic.   BELOW ARE SYMPTOMS THAT SHOULD BE REPORTED IMMEDIATELY:  *FEVER GREATER THAN 100.5 F  *CHILLS WITH OR WITHOUT FEVER  NAUSEA AND VOMITING THAT IS NOT CONTROLLED WITH YOUR NAUSEA MEDICATION  *UNUSUAL SHORTNESS OF BREATH  *UNUSUAL BRUISING OR BLEEDING  TENDERNESS IN MOUTH AND THROAT WITH OR WITHOUT PRESENCE OF ULCERS  *URINARY PROBLEMS  *BOWEL PROBLEMS  UNUSUAL RASH Items with * indicate a potential emergency and should be followed up as soon as possible.  Feel free to call the clinic you have any questions or concerns. The clinic phone number is [phone].

## 2014-08-04 NOTE — Progress Notes (Signed)
will   SYMPTOM MANAGEMENT CLINIC   HPI: [name] [name] 43 y.o. female diagnosed with breast cancer.  Here today to initiate Perjeta/Herceptin/paclitaxel chemotherapy regimen.  Patient presented to the [facility] today to receive her first cycle of Perjeta/Herceptin/paclitaxel chemotherapy when.  She will receive the paclitaxel on a weekly basis; and will receive the Herceptin/Perjeta on an every three-week basis.  Patient completed the Perjeta/Herceptin portion of her therapy fairly late in the evening today; so the decision was made for patient to return percent in the morning to initiate her first cycle of the paclitaxel portion of her chemotherapy.  Patient did develop a low-grade fever to maximum 99.4 just prior to finishing the Herceptin portion of her regimen.  Patient confirmed she did take both Tylenol and Benadryl as premedications.  The Herceptin infusion was held for a brief amount of time; and patient was observed.  No further hypersensitivity reaction symptoms were noted whatsoever.  Patient was able to complete the Herceptin portion of her    regimen today. HPI  CURRENT THERAPY: Upcoming Treatment Dates - BREAST Paclitaxel q7d Days with orders from any treatment category:  08/10/2014      SCHEDULING COMMUNICATION      diphenhydrAMINE (BENADRYL) injection 50 mg      Dexamethasone Sodium Phosphate (DECADRON) injection 20 mg      ondansetron (ZOFRAN) IVPB 8 mg      famotidine (PEPCID) IVPB 20 mg      PACLitaxel (TAXOL) 174 mg in dextrose 5 % 250 mL chemo infusion (</= 51m/m2)      sodium chloride 0.9 % injection 10 mL      heparin lock flush 100 unit/mL      heparin lock flush 100 unit/mL      alteplase (CATHFLO ACTIVASE) injection 2 mg      sodium chloride 0.9 % injection 3 mL      Cold Pack 1 packet      diphenhydrAMINE (BENADRYL) injection 25 mg      famotidine (PEPCID) IVPB 20 mg      0.9 %  sodium chloride infusion      methylPREDNISolone sodium succinate  (SOLU-MEDROL) 125 mg/2 mL injection 125 mg      EPINEPHrine (ADRENALIN) 0.1 MG/ML injection 0.25 mg      EPINEPHrine (ADRENALIN) 0.1 MG/ML injection 0.25 mg      EPINEPHrine (ADRENALIN) injection 0.5 mg      EPINEPHrine (ADRENALIN) injection 0.5 mg      diphenhydrAMINE (BENADRYL) injection 50 mg      albuterol (PROVENTIL) (2.5 MG/3ML) 0.083% nebulizer solution 2.5 mg      0.9 %  sodium chloride infusion      TREATMENT CONDITIONS 08/17/2014      SCHEDULING COMMUNICATION      diphenhydrAMINE (BENADRYL) injection 50 mg      Dexamethasone Sodium Phosphate (DECADRON) injection 20 mg      ondansetron (ZOFRAN) IVPB 8 mg      famotidine (PEPCID) IVPB 20 mg      PACLitaxel (TAXOL) 174 mg in dextrose 5 % 250 mL chemo infusion (</= 861mm2)      sodium chloride 0.9 % injection 10 mL      heparin lock flush 100 unit/mL      heparin lock flush 100 unit/mL      alteplase (CATHFLO ACTIVASE) injection 2 mg      sodium chloride 0.9 % injection 3 mL      Cold Pack 1 packet  diphenhydrAMINE (BENADRYL) injection 25 mg      famotidine (PEPCID) IVPB 20 mg      0.9 %  sodium chloride infusion      methylPREDNISolone sodium succinate (SOLU-MEDROL) 125 mg/2 mL injection 125 mg      EPINEPHrine (ADRENALIN) 0.1 MG/ML injection 0.25 mg      EPINEPHrine (ADRENALIN) 0.1 MG/ML injection 0.25 mg      EPINEPHrine (ADRENALIN) injection 0.5 mg      EPINEPHrine (ADRENALIN) injection 0.5 mg      diphenhydrAMINE (BENADRYL) injection 50 mg      albuterol (PROVENTIL) (2.5 MG/3ML) 0.083% nebulizer solution 2.5 mg      0.9 %  sodium chloride infusion      TREATMENT CONDITIONS 08/24/2014      SCHEDULING COMMUNICATION      diphenhydrAMINE (BENADRYL) injection 50 mg      Dexamethasone Sodium Phosphate (DECADRON) injection 20 mg      ondansetron (ZOFRAN) IVPB 8 mg      famotidine (PEPCID) IVPB 20 mg      PACLitaxel (TAXOL) 174 mg in dextrose 5 % 250 mL chemo infusion (</= $RemoveBefor'80mg'[id number]$ /m2)      sodium chloride 0.9 % injection 10  mL      heparin lock flush 100 unit/mL      heparin lock flush 100 unit/mL      alteplase (CATHFLO ACTIVASE) injection 2 mg      sodium chloride 0.9 % injection 3 mL      Cold Pack 1 packet      diphenhydrAMINE (BENADRYL) injection 25 mg      famotidine (PEPCID) IVPB 20 mg      0.9 %  sodium chloride infusion      methylPREDNISolone sodium succinate (SOLU-MEDROL) 125 mg/2 mL injection 125 mg      EPINEPHrine (ADRENALIN) 0.1 MG/ML injection 0.25 mg      EPINEPHrine (ADRENALIN) 0.1 MG/ML injection 0.25 mg      EPINEPHrine (ADRENALIN) injection 0.5 mg      EPINEPHrine (ADRENALIN) injection 0.5 mg      diphenhydrAMINE (BENADRYL) injection 50 mg      albuterol (PROVENTIL) (2.5 MG/3ML) 0.083% nebulizer solution 2.5 mg      0.9 %  sodium chloride infusion      TREATMENT CONDITIONS    ROS  Past Medical History  Diagnosis Date  . Breast cancer   . Pneumonia     hx of pneumonia 08/2013   . GERD (gastroesophageal reflux disease)     during pregnancy   . Breast cancer     Past Surgical History  Procedure Laterality Date  . Cesarean section    . Essure tubal ligation    . Cholecystectomy    . Wisdom tooth extraction    . Portacath placement Left 07/21/2014    Procedure: INSERTION PORT-A-CATH;  Surgeon: Stark Klein, MD;  Location: [facility];  Service: General;  Laterality: Left;    has Breast cancer 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; Family history of breast cancer; Lung mas bilaterial; and Fever on her problem list.     is allergic to aspirin and codeine.    Medication List       This list is accurate as of: 08/03/14 11:59 PM.  Always use your most recent med list.               albuterol 108 (90 BASE) MCG/ACT inhaler  Commonly known as:  PROVENTIL HFA;VENTOLIN HFA  Inhale 2 puffs into the lungs every 4 (four)  hours as needed for wheezing or shortness of breath.     benzonatate 100 MG capsule  Commonly known as:  TESSALON  Take 1 capsule (100 mg total) by mouth 3 (three) times daily  as needed for cough.     cetirizine 10 MG tablet  Commonly known as:  ZYRTEC  Take 10 mg by mouth daily as needed for allergies.     ibuprofen 200 MG tablet  Commonly known as:  ADVIL,MOTRIN  Take 400-800 mg by mouth every 6 (six) hours as needed for headache or moderate pain.     ibuprofen 800 MG tablet  Commonly known as:  ADVIL,MOTRIN  Take 800 mg by mouth as needed.     lidocaine-prilocaine cream  Commonly known as:  EMLA  Apply 1 application topically as needed. Apply to portacath 1 1/2  -  2  Hours prior to procedures as needed.     metaxalone 800 MG tablet  Commonly known as:  SKELAXIN  Take 800 mg by mouth 3 (three) times daily as needed for muscle spasms.     NYQUIL PO  Take 30 mLs by mouth 2 (two) times daily as needed (cold/allergies).     ondansetron 8 MG tablet  Commonly known as:  ZOFRAN  Take 1 tablet (8 mg total) by mouth every 8 (eight) hours as needed for nausea or vomiting.     oxyCODONE-acetaminophen 5-325 MG per tablet  Commonly known as:  ROXICET  Take 1-2 tablets by mouth every 4 (four) hours as needed for severe pain.     TRIPLE ANTIBIOTIC EX  Apply 1 application topically 2 (two) times daily as needed (eczema on hands.).     VICKS VAPORUB EX  Apply 1 application topically at bedtime as needed (sinus). Applies under nose, throat and on chest.         PHYSICAL EXAMINATION  Vitals: BP 124/91, HR 104, temp 99.4  Physical Exam  Constitutional: She is oriented to person, place, and time and well-developed, well-nourished, and in no distress.  HENT:  Head: Normocephalic and atraumatic.  Mouth/Throat: Oropharynx is clear and moist.  Eyes: Conjunctivae and EOM are normal. Pupils are equal, round, and reactive to light. Right eye exhibits no discharge. Left eye exhibits no discharge. No scleral icterus.  Neck: Normal range of motion.  Pulmonary/Chest: Effort normal. No respiratory distress.  Musculoskeletal: Normal range of motion. She exhibits no  edema.  Neurological: She is alert and oriented to person, place, and time.  Skin: Skin is warm and dry.  Psychiatric:  Patient fairly anxious and tearful on exam today.  Nursing note and vitals reviewed.   LABORATORY DATA:. Clinical Support on 08/03/2014  Component Date Value Ref Range Status  . WBC 08/03/2014 8.6  3.9 - 10.3 10e3/uL Final  . NEUT# 08/03/2014 5.1  1.5 - 6.5 10e3/uL Final  . HGB 08/03/2014 12.1  11.6 - 15.9 g/dL Final  . HCT 08/03/2014 37.1  34.8 - 46.6 % Final  . Platelets 08/03/2014 257  145 - 400 10e3/uL Final  . MCV 08/03/2014 86.9  79.5 - 101.0 fL Final  . MCH 08/03/2014 28.3  25.1 - 34.0 pg Final  . MCHC 08/03/2014 32.6  31.5 - 36.0 g/dL Final  . RBC 08/03/2014 4.27  3.70 - 5.45 10e6/uL Final  . RDW 08/03/2014 13.1  11.2 - 14.5 % Final  . lymph# 08/03/2014 2.6  0.9 - 3.3 10e3/uL Final  . MONO# 08/03/2014 0.7  0.1 - 0.9 10e3/uL Final  . Eosinophils  Absolute 08/03/2014 0.2  0.0 - 0.5 10e3/uL Final  . Basophils Absolute 08/03/2014 0.0  0.0 - 0.1 10e3/uL Final  . NEUT% 08/03/2014 59.7  38.4 - 76.8 % Final  . LYMPH% 08/03/2014 29.9  14.0 - 49.7 % Final  . MONO% 08/03/2014 7.9  0.0 - 14.0 % Final  . EOS% 08/03/2014 2.2  0.0 - 7.0 % Final  . BASO% 08/03/2014 0.3  0.0 - 2.0 % Final  . Sodium 08/03/2014 141  136 - 145 mEq/L Final  . Potassium 08/03/2014 3.9  3.5 - 5.1 mEq/L Final  . Chloride 08/03/2014 105  98 - 109 mEq/L Final  . CO2 08/03/2014 27  22 - 29 mEq/L Final  . Glucose 08/03/2014 95  70 - 140 mg/dl Final  . BUN 08/03/2014 11.3  7.0 - 26.0 mg/dL Final  . Creatinine 08/03/2014 0.9  0.6 - 1.1 mg/dL Final  . Total Bilirubin 08/03/2014 0.30  0.20 - 1.20 mg/dL Final  . Alkaline Phosphatase 08/03/2014 71  40 - 150 U/L Final  . AST 08/03/2014 19  5 - 34 U/L Final  . ALT 08/03/2014 17  0 - 55 U/L Final  . Total Protein 08/03/2014 7.4  6.4 - 8.3 g/dL Final  . Albumin 08/03/2014 3.5  3.5 - 5.0 g/dL Final  . Calcium 08/03/2014 9.3  8.4 - 10.4 mg/dL Final  .  Anion Gap 08/03/2014 9  3 - 11 mEq/L Final  . EGFR 08/03/2014 >90  >90 ml/min/1.73 m2 Final   eGFR is calculated using the CKD-EPI Creatinine Equation (2009)     RADIOGRAPHIC STUDIES: Dg Chest 2 View  08/01/2014   CLINICAL DATA:  Recent diagnosis of breast cancer with pulmonary metastases. Acute onset of right-sided chest pain and cough. Initial encounter.  EXAM: CHEST  2 VIEW  COMPARISON:  Chest radiograph performed 07/26/2014, and CT of the chest performed 07/20/2014  FINDINGS: Bilateral pulmonary masses and nodules are again seen, larger on the left. Mild vascular congestion is noted. No superimposed airspace consolidation is noted. No pleural effusion or pneumothorax is seen.  The cardiomediastinal silhouette is normal in size. A left-sided chest port is noted ending about the mid to distal SVC. No acute osseous abnormalities are identified. Clips are noted within the right upper quadrant, reflecting prior cholecystectomy.  IMPRESSION: Bilateral pulmonary masses and nodules again seen, larger on the left. Mild vascular congestion noted. No superimposed airspace consolidation seen at this time.   Electronically Signed   By: Garald Balding M.D.   On: 08/01/2014 23:49    ASSESSMENT/PLAN:    Breast cancer 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 Patient presented to the [facility] today to initiate her paclitaxel/Herceptin/Perjeta chemotherapy regimen.  She completed the Herceptin and Perjeta portion of her therapy very late in the afternoon; and the decision was made for patient to return first thing in the morning to receive the first cycle of her paclitaxel chemotherapy.  Patient did develop a slight fever to maximum of 99.4 following the Herceptin infusion.  She developed some brief chills; and was slightly anxious and tearful.  Confirm the patient did receive premedications of both Tylenol 650 mg and Benadryl 50 mg prior to the Herceptin and Perjeta.  Herceptin was held for a brief amount of time; and patient's  chills did completely resolve.  Patient was able to complete the Herceptin portion of her therapy today.  Patient has plans to return first thing in the morning to receive her first cycle paclitaxel; and then has plans to return on  08/10/2014 for labs, follow up visit, and her next cycle of chemotherapy.  Fever Patient presented to the [facility] today to receive her first cycle of paclitaxel/Herceptin/Perjeta.  She completed the Perjeta portion of her therapy with no difficulty; but did experience a low-grade fever of 99.4 just prior to completing the Herceptin infusion.  She developed some mild chills; and became anxious/tearful.  She had no riders on exam.  She also denied any scratchy throat or airway issues.  She had no rash whatsoever.  Confirmed patient did receive premedications of both Tylenol 650 mg and Benadryl 50 mg.  Herceptin infusion was held for a brief amount of time; and patient monitor closely.  All chills did resolve; and patient was able to complete the Herceptin portion of her therapy.  Patient stated understanding of all instructions; and was in agreement with this plan of care. The patient knows to call the clinic with any problems, questions or concerns.   Review/collaboration with Dr. Burr Medico regarding all aspects of patient's visit today.   Total time spent with patient was 25 minutes;  with greater than 75 percent of that time spent in face to face counseling regarding her symptoms, monitoring of patient while in the infusion area,  and coordination of care and follow up.  Disclaimer: This note was dictated with voice recognition software. Similar sounding words can inadvertently be transcribed and may not be corrected upon review.   Drue Second, NP 08/04/2014

## 2014-08-04 NOTE — Assessment & Plan Note (Signed)
Patient presented to the [facility] today to initiate her paclitaxel/Herceptin/Perjeta chemotherapy regimen.  She completed the Herceptin and Perjeta portion of her therapy very late in the afternoon; and the decision was made for patient to return first thing in the morning to receive the first cycle of her paclitaxel chemotherapy.  Patient did develop a slight fever to maximum of 99.4 following the Herceptin infusion.  She developed some brief chills; and was slightly anxious and tearful.  Confirm the patient did receive premedications of both Tylenol 650 mg and Benadryl 50 mg prior to the Herceptin and Perjeta.  Herceptin was held for a brief amount of time; and patient's chills did completely resolve.  Patient was able to complete the Herceptin portion of her therapy today.  Patient has plans to return first thing in the morning to receive her first cycle paclitaxel; and then has plans to return on 08/10/2014 for labs, follow up visit, and her next cycle of chemotherapy.

## 2014-08-09 ENCOUNTER — Telehealth: Payer: Self-pay | Admitting: Hematology

## 2014-08-09 NOTE — Telephone Encounter (Signed)
Pt called and confirm appt d/t fer 08/10/14.

## 2014-08-10 ENCOUNTER — Ambulatory Visit: Payer: BC Managed Care – PPO | Admitting: Hematology

## 2014-08-10 ENCOUNTER — Other Ambulatory Visit: Payer: BC Managed Care – PPO

## 2014-08-10 ENCOUNTER — Encounter: Payer: Self-pay | Admitting: Hematology

## 2014-08-10 ENCOUNTER — Ambulatory Visit (HOSPITAL_BASED_OUTPATIENT_CLINIC_OR_DEPARTMENT_OTHER): Payer: BC Managed Care – PPO

## 2014-08-10 ENCOUNTER — Telehealth: Payer: Self-pay | Admitting: Hematology

## 2014-08-10 ENCOUNTER — Other Ambulatory Visit (HOSPITAL_BASED_OUTPATIENT_CLINIC_OR_DEPARTMENT_OTHER): Payer: BC Managed Care – PPO

## 2014-08-10 ENCOUNTER — Ambulatory Visit (HOSPITAL_BASED_OUTPATIENT_CLINIC_OR_DEPARTMENT_OTHER): Payer: BC Managed Care – PPO | Admitting: Hematology

## 2014-08-10 VITALS — BP 121/70 | HR 81 | Temp 98.8°F | Resp 19 | Ht 70.0 in | Wt 215.8 lb

## 2014-08-10 DIAGNOSIS — R197 Diarrhea, unspecified: Secondary | ICD-10-CM

## 2014-08-10 DIAGNOSIS — C50911 Malignant neoplasm of unspecified site of right female breast: Secondary | ICD-10-CM

## 2014-08-10 DIAGNOSIS — C50311 Malignant neoplasm of lower-inner quadrant of right female breast: Secondary | ICD-10-CM

## 2014-08-10 DIAGNOSIS — C7800 Secondary malignant neoplasm of unspecified lung: Principal | ICD-10-CM

## 2014-08-10 DIAGNOSIS — C773 Secondary and unspecified malignant neoplasm of axilla and upper limb lymph nodes: Secondary | ICD-10-CM

## 2014-08-10 DIAGNOSIS — C7801 Secondary malignant neoplasm of right lung: Secondary | ICD-10-CM

## 2014-08-10 DIAGNOSIS — C7802 Secondary malignant neoplasm of left lung: Secondary | ICD-10-CM

## 2014-08-10 DIAGNOSIS — Z170 Estrogen receptor positive status [ER+]: Secondary | ICD-10-CM

## 2014-08-10 DIAGNOSIS — Z5111 Encounter for antineoplastic chemotherapy: Secondary | ICD-10-CM

## 2014-08-10 LAB — CBC WITH DIFFERENTIAL/PLATELET
BASO%: 0.5 % (ref 0.0–2.0)
Basophils Absolute: 0 10*3/uL (ref 0.0–0.1)
EOS%: 3 % (ref 0.0–7.0)
Eosinophils Absolute: 0.2 10*3/uL (ref 0.0–0.5)
HEMATOCRIT: 38.6 % (ref 34.8–46.6)
HGB: 12.5 g/dL (ref 11.6–15.9)
LYMPH%: 35.8 % (ref 14.0–49.7)
MCH: 28.2 pg (ref 25.1–34.0)
MCHC: 32.4 g/dL (ref 31.5–36.0)
MCV: 86.9 fL (ref 79.5–101.0)
MONO#: 0.2 10*3/uL (ref 0.1–0.9)
MONO%: 2.7 % (ref 0.0–14.0)
NEUT#: 3.7 10*3/uL (ref 1.5–6.5)
NEUT%: 58 % (ref 38.4–76.8)
PLATELETS: 239 10*3/uL (ref 145–400)
RBC: 4.44 10*6/uL (ref 3.70–5.45)
RDW: 13 % (ref 11.2–14.5)
WBC: 6.4 10*3/uL (ref 3.9–10.3)
lymph#: 2.3 10*3/uL (ref 0.9–3.3)

## 2014-08-10 LAB — COMPREHENSIVE METABOLIC PANEL (CC13)
ALK PHOS: 65 U/L (ref 40–150)
ALT: 32 U/L (ref 0–55)
ANION GAP: 7 meq/L (ref 3–11)
AST: 22 U/L (ref 5–34)
Albumin: 3.7 g/dL (ref 3.5–5.0)
BILIRUBIN TOTAL: 0.66 mg/dL (ref 0.20–1.20)
BUN: 11.9 mg/dL (ref 7.0–26.0)
CHLORIDE: 105 meq/L (ref 98–109)
CO2: 27 meq/L (ref 22–29)
Calcium: 9.2 mg/dL (ref 8.4–10.4)
Creatinine: 0.8 mg/dL (ref 0.6–1.1)
GLUCOSE: 104 mg/dL (ref 70–140)
POTASSIUM: 4.2 meq/L (ref 3.5–5.1)
Sodium: 139 mEq/L (ref 136–145)
TOTAL PROTEIN: 7.6 g/dL (ref 6.4–8.3)

## 2014-08-10 MED ORDER — ONDANSETRON 8 MG/NS 50 ML IVPB
INTRAVENOUS | Status: AC
  Filled 2014-08-10: qty 8

## 2014-08-10 MED ORDER — SODIUM CHLORIDE 0.9 % IJ SOLN
10.0000 mL | INTRAMUSCULAR | Status: DC | PRN
  Administered 2014-08-10: 10 mL
  Filled 2014-08-10: qty 10

## 2014-08-10 MED ORDER — ONDANSETRON 8 MG/50ML IVPB (CHCC)
8.0000 mg | Freq: Once | INTRAVENOUS | Status: AC
  Administered 2014-08-10: 8 mg via INTRAVENOUS

## 2014-08-10 MED ORDER — DIPHENHYDRAMINE HCL 50 MG/ML IJ SOLN
INTRAMUSCULAR | Status: AC
  Filled 2014-08-10: qty 1

## 2014-08-10 MED ORDER — DEXAMETHASONE SODIUM PHOSPHATE 20 MG/5ML IJ SOLN
INTRAMUSCULAR | Status: AC
  Filled 2014-08-10: qty 5

## 2014-08-10 MED ORDER — PACLITAXEL CHEMO INJECTION 300 MG/50ML
80.0000 mg/m2 | Freq: Once | INTRAVENOUS | Status: AC
  Administered 2014-08-10: 174 mg via INTRAVENOUS
  Filled 2014-08-10: qty 29

## 2014-08-10 MED ORDER — FAMOTIDINE IN NACL 20-0.9 MG/50ML-% IV SOLN
20.0000 mg | Freq: Once | INTRAVENOUS | Status: AC
  Administered 2014-08-10: 20 mg via INTRAVENOUS

## 2014-08-10 MED ORDER — HEPARIN SOD (PORK) LOCK FLUSH 100 UNIT/ML IV SOLN
500.0000 [IU] | Freq: Once | INTRAVENOUS | Status: AC | PRN
  Administered 2014-08-10: 500 [IU]
  Filled 2014-08-10: qty 5

## 2014-08-10 MED ORDER — DEXAMETHASONE SODIUM PHOSPHATE 20 MG/5ML IJ SOLN
20.0000 mg | Freq: Once | INTRAMUSCULAR | Status: AC
  Administered 2014-08-10: 20 mg via INTRAVENOUS

## 2014-08-10 MED ORDER — SODIUM CHLORIDE 0.9 % IV SOLN
Freq: Once | INTRAVENOUS | Status: AC
  Administered 2014-08-10: 13:00:00 via INTRAVENOUS

## 2014-08-10 MED ORDER — FAMOTIDINE IN NACL 20-0.9 MG/50ML-% IV SOLN
INTRAVENOUS | Status: AC
  Filled 2014-08-10: qty 50

## 2014-08-10 MED ORDER — DIPHENHYDRAMINE HCL 50 MG/ML IJ SOLN
50.0000 mg | Freq: Once | INTRAMUSCULAR | Status: AC
  Administered 2014-08-10: 50 mg via INTRAVENOUS

## 2014-08-10 NOTE — Progress Notes (Signed)
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 MRI breast on 07/16/2014 showed 7cm R breast lesion and 4.4cm r axillary node    07/02/2014 Imaging CT CAP: a 4.7cm mass in LUL lung and a 2.1cm mas in RML, and a small nodule in RUL, suspecious for metastasis     07/09/2014 Initial Diagnosis right IDA with b/l lung lesions, ER-/PR-/HER2+   07/09/2014 Initial Biopsy US guided right breast mass and axillary node biopsy showed IDA, and DCIS, ER-/PR-/HER2+   CURRENT THERAPY: weekly Paclitaxel 59m/m2, trastuzumab and 2 to map every 3 weeks, started on 08/04/2014  INTERVAL HISTROY   [name] [name] for follow-up. She received first cycle chemo. She tolerated it well overall. She had a few episodes of abd cramps, and diarrhea 2-3 times for 3-4 days, much better today. She had low grade fever T 99.5 with chills, but it resolved quickly on its own. No significant nausea, eats well.    MEDICAL HISTORY:  Past Medical History  Diagnosis Date  . Breast cancer   . Pneumonia     hx of pneumonia 08/2013   . GERD (gastroesophageal reflux disease)     during pregnancy   . Breast cancer     SURGICAL HISTORY: Past Surgical History  Procedure Laterality Date  . Cesarean section    . Essure tubal ligation    . Cholecystectomy    . Wisdom  tooth extraction    . Portacath placement Left 07/21/2014    Procedure: INSERTION PORT-A-CATH;  Surgeon: FStark Klein MD;  Location: [facility];  Service: General;  Laterality: Left;    SOCIAL HISTORY: History   Social History  . Marital Status: Married    Spouse Name: [name] [name]    Number of Children:  she has 2 daughters at the age of 111and 171   . Years of Education: [name] [name]   Occupational History  .  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 Smoking status: Never Smoker   . Smokeless tobacco: Not on file  . Alcohol Use: Yes  . Drug Use: No  . Sexual Activity: No    FAMILY HISTORY: Family History  Problem Relation Age of Onset  . Breast cancer Mother 561 . Liver cancer Father 663 . Breast cancer Maternal Aunt 36  . Prostate cancer Maternal Grandfather   . Liver cancer Paternal Grandmother   . Prostate cancer Paternal Grandfather       ALLERGIES:  is allergic to aspirin and codeine.  MEDICATIONS:  Current Outpatient Prescriptions  Medication Sig Dispense Refill  . albuterol (PROVENTIL HFA;VENTOLIN HFA) 108 (90 BASE) MCG/ACT inhaler Inhale 2 puffs into the lungs every 4 (four) hours as needed for wheezing or shortness of breath.    . benzonatate (TESSALON) 100 MG capsule Take 1 capsule (100 mg total) by mouth  3 (three) times daily as needed for cough. 21 capsule 0  . Camphor-Eucalyptus-Menthol (VICKS VAPORUB EX) Apply 1 application topically at bedtime as needed (sinus). Applies under nose, throat and on chest.    . cetirizine (ZYRTEC) 10 MG tablet Take 10 mg by mouth daily as needed for allergies.     Marland Kitchen ibuprofen (ADVIL,MOTRIN) 200 MG tablet Take 400-800 mg by mouth every 6 (six) hours as needed for headache or moderate pain.    Marland Kitchen ibuprofen (ADVIL,MOTRIN) 800 MG tablet Take 800 mg by mouth as needed.  0  . lidocaine-prilocaine (EMLA) cream Apply 1 application topically as needed. Apply to portacath 1 1/2  -  2  Hours prior to procedures as needed.  30 g 1  . metaxalone (SKELAXIN) 800 MG tablet Take 800 mg by mouth 3 (three) times daily as needed for muscle spasms.    . Neomycin-Bacitracin-Polymyxin (TRIPLE ANTIBIOTIC EX) Apply 1 application topically 2 (two) times daily as needed (eczema on hands.).    Marland Kitchen ondansetron (ZOFRAN) 8 MG tablet Take 1 tablet (8 mg total) by mouth every 8 (eight) hours as needed for nausea or vomiting. 30 tablet 3  . oxyCODONE-acetaminophen (ROXICET) 5-325 MG per tablet Take 1-2 tablets by mouth every 4 (four) hours as needed for severe pain. 30 tablet 0  . Pseudoeph-Doxylamine-DM-APAP (NYQUIL PO) Take 30 mLs by mouth 2 (two) times daily as needed (cold/allergies).      No current facility-administered medications for this visit.    REVIEW OF SYSTEMS:   Constitutional: Denies fevers, chills or abnormal night sweats, (+) malaise  Eyes: Denies blurriness of vision, double vision or watery eyes Ears, nose, mouth, throat, and face: Denies mucositis or sore throat Respiratory: (+) dry cough, no dyspnea or wheezes Cardiovascular: Denies palpitation, chest discomfort or lower extremity swelling Gastrointestinal:  Denies nausea, heartburn or change in bowel habits Skin: Denies abnormal skin rashes Lymphatics: Denies new lymphadenopathy or easy bruising Neurological:Denies numbness, tingling or new weaknesses Behavioral/Psych: Mood is stable, no new changes  All other systems were reviewed with the patient and are negative.  PHYSICAL EXAMINATION: ECOG PERFORMANCE STATUS: 0 - Asymptomatic  Filed Vitals:   08/10/14 1055  BP: 121/70  Pulse: 81  Temp: 98.8 F (37.1 C)  Resp: 19   Filed Weights   08/10/14 1055  Weight: 215 lb 12.8 oz (97.886 kg)    GENERAL:alert, no distress and comfortable SKIN: skin color, texture, turgor are normal, no rashes or significant lesions EYES: normal, conjunctiva are pink and non-injected, sclera clear OROPHARYNX:no exudate, no erythema and lips, buccal mucosa, and tongue  normal  NECK: supple, thyroid normal size, non-tender, without nodularity LYMPH:  no palpable lymphadenopathy in the cervical, axillary or inguinal LUNGS: clear to auscultation and percussion with normal breathing effort HEART: regular rate & rhythm and no murmurs and no lower extremity edema ABDOMEN:abdomen soft, non-tender and normal bowel sounds Musculoskeletal:no cyanosis of digits and no clubbing  PSYCH: alert & oriented x 3 with fluent speech NEURO: no focal motor/sensory deficits Breasts: Breast inspection showed them to be symmetrical with no skin change or nipple discharge. Palpation of the right breast showed a 5 x 8 cm firm mass in the lower inner quadrant and a 3 x 2 cm firm node in right axilla. Left breast  and axilla revealed no obvious mass that I could appreciate.   LABORATORY DATA:  I have reviewed the data as listed Lab Results  Component Value Date   WBC 6.4 08/10/2014   HGB  12.5 08/10/2014   HCT 38.6 08/10/2014   MCV 86.9 08/10/2014   PLT 239 08/10/2014    Recent Labs  07/14/14 1251 08/03/14 1149 08/10/14 1013  NA 139 141 139  K 4.0 3.9 4.2  CO2 _0 GLUCOSE 73 95 104  BUN 10.9 11.3 11.9  CREATININE 0.9 0.9 0.8  CALCIUM 9.9 9.3 9.2  PROT 8.4* 7.4 7.6  ALBUMIN 3.8 3.5 3.7  AST _1 ALT 21 17 32  ALKPHOS 79 71 65  BILITOT 0.36 0.30 0.66   PATHOLOGY REPORT 07/09/2014 #1 breast, right needle core biopsy more anterior -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 45)  Lung, biopsy, Left 07/26/2014 - HIGH GRADE CARCINOMA, SEE COMMENT. Microscopic Comment The carcinoma demonstrates the following immunophenotype: TTF-1 - negative expression. Napsin A- negative expression. CK5/6 - focal, moderate expression. estrogen  receptor - negative expression. GCDFP- negative expression. The recent breast biopsy demonstrating Her2 amplified high grade invasive mammary carcinoma is noted ([id number]). Although the immunophenotype of the current case is non-sepcific, it strongly argues against primary lung adenocarcinoma. However, on re-review, the morphology of the current case is essentially identical to the primary mammary carcinoma. In lieu of further immunophenotyping, the tumor will be submitted for Her2 testing and the remaining tissue will be reserved for additional ancillary tumor testing. The results of the Her2 testing will be reported in an addendum. The case was discussed with Dr Burr Medico on 07/28/2015 and 07/29/2015 HER2 POSITIVE    RADIOGRAPHIC STUDIES: CT CAP 07/20/2014 IMPRESSION: 1. Right breast mass is compatible with known malignancy. Adjacent soft tissue masses may represent satellite lesions. Recommend correlation with mammography, ultrasound or MRI. 2. Left upper lobe, right middle lobe and bilateral lower lobe pulmonary masses/nodules are concerning for metastatic disease. 3. Right axillary and left hilar adenopathy are concerning for metastatic disease. 4. 3 mm nonobstructing right renal stone.   ECHO 12/8 Impressions: - Normal biventricular size and systolic function. Impaired relaxation with normal filling pressures. Mild tricupsid regurgitation with normal RVSP.  ASSESSMENT & PLAN:  43 year old African-American female, premenopausal, without significant past medical history, presented with palpable right breast mass and right axillary mass.   1. 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 -continue weekly paclitaxel, trastuzumab and pertuzumab every 3 weeks. I'll evaluate her response to the  treatment in 2-3 months by a restaging CT scan, I will likely to continue the this regiment for 6-9 months. If she has excellent response, then I'll change to Herceptin maintenance therapy. -G1 abdominal cramps and diarrhea, likely secondary to Taxol. She also had mild reaction to Herceptin or pertuzumab, will add solu-medro 23m iv as premeds.  -She had normal echo at baseline, we'll repeat her echo  every 3 months when she is on and HER-2 therapy.  Plan -use imodium as needed for diarrhea after chemo -RTC in 2 weeks with lab    Patient and her family members had a many questions, I answered to their satisfaction.  I spent about 10 minutes counseling the patient and her family members, total care was about 15 minutes.  FTruitt Merle 08/10/2014

## 2014-08-10 NOTE — Patient Instructions (Signed)
[facility] Discharge Instructions for Patients Receiving Chemotherapy  Today you received the following chemotherapy agents: Taxol  To help prevent nausea and vomiting after your treatment, we encourage you to take your nausea medication as prescribed by your physician.  If you develop nausea and vomiting that is not controlled by your nausea medication, call the clinic.   BELOW ARE SYMPTOMS THAT SHOULD BE REPORTED IMMEDIATELY:  *FEVER GREATER THAN 100.5 F  *CHILLS WITH OR WITHOUT FEVER  NAUSEA AND VOMITING THAT IS NOT CONTROLLED WITH YOUR NAUSEA MEDICATION  *UNUSUAL SHORTNESS OF BREATH  *UNUSUAL BRUISING OR BLEEDING  TENDERNESS IN MOUTH AND THROAT WITH OR WITHOUT PRESENCE OF ULCERS  *URINARY PROBLEMS  *BOWEL PROBLEMS  UNUSUAL RASH Items with * indicate a potential emergency and should be followed up as soon as possible.  Feel free to call the clinic you have any questions or concerns. The clinic phone number is [phone].    

## 2014-08-10 NOTE — Telephone Encounter (Signed)
gv pt appt schedule for jan/feb.

## 2014-08-17 ENCOUNTER — Ambulatory Visit (HOSPITAL_BASED_OUTPATIENT_CLINIC_OR_DEPARTMENT_OTHER): Payer: BLUE CROSS/BLUE SHIELD

## 2014-08-17 ENCOUNTER — Ambulatory Visit (HOSPITAL_BASED_OUTPATIENT_CLINIC_OR_DEPARTMENT_OTHER): Payer: BLUE CROSS/BLUE SHIELD | Admitting: Nurse Practitioner

## 2014-08-17 ENCOUNTER — Other Ambulatory Visit: Payer: BC Managed Care – PPO

## 2014-08-17 ENCOUNTER — Other Ambulatory Visit (HOSPITAL_BASED_OUTPATIENT_CLINIC_OR_DEPARTMENT_OTHER): Payer: BLUE CROSS/BLUE SHIELD

## 2014-08-17 DIAGNOSIS — Z5111 Encounter for antineoplastic chemotherapy: Secondary | ICD-10-CM

## 2014-08-17 DIAGNOSIS — C50311 Malignant neoplasm of lower-inner quadrant of right female breast: Secondary | ICD-10-CM

## 2014-08-17 DIAGNOSIS — C7800 Secondary malignant neoplasm of unspecified lung: Secondary | ICD-10-CM

## 2014-08-17 DIAGNOSIS — C7801 Secondary malignant neoplasm of right lung: Secondary | ICD-10-CM

## 2014-08-17 DIAGNOSIS — C50911 Malignant neoplasm of unspecified site of right female breast: Secondary | ICD-10-CM

## 2014-08-17 DIAGNOSIS — R21 Rash and other nonspecific skin eruption: Secondary | ICD-10-CM

## 2014-08-17 LAB — CBC WITH DIFFERENTIAL/PLATELET
BASO%: 0.7 % (ref 0.0–2.0)
BASOS ABS: 0 10*3/uL (ref 0.0–0.1)
EOS%: 2.5 % (ref 0.0–7.0)
Eosinophils Absolute: 0.1 10*3/uL (ref 0.0–0.5)
HEMATOCRIT: 35.8 % (ref 34.8–46.6)
HEMOGLOBIN: 11.5 g/dL — AB (ref 11.6–15.9)
LYMPH#: 2.1 10*3/uL (ref 0.9–3.3)
LYMPH%: 40 % (ref 14.0–49.7)
MCH: 28 pg (ref 25.1–34.0)
MCHC: 32.2 g/dL (ref 31.5–36.0)
MCV: 86.9 fL (ref 79.5–101.0)
MONO#: 0.4 10*3/uL (ref 0.1–0.9)
MONO%: 7.6 % (ref 0.0–14.0)
NEUT#: 2.6 10*3/uL (ref 1.5–6.5)
NEUT%: 49.2 % (ref 38.4–76.8)
Platelets: 292 10*3/uL (ref 145–400)
RBC: 4.13 10*6/uL (ref 3.70–5.45)
RDW: 13.6 % (ref 11.2–14.5)
WBC: 5.2 10*3/uL (ref 3.9–10.3)

## 2014-08-17 LAB — COMPREHENSIVE METABOLIC PANEL (CC13)
ALT: 22 U/L (ref 0–55)
ANION GAP: 7 meq/L (ref 3–11)
AST: 16 U/L (ref 5–34)
Albumin: 3.7 g/dL (ref 3.5–5.0)
Alkaline Phosphatase: 68 U/L (ref 40–150)
BUN: 13 mg/dL (ref 7.0–26.0)
CHLORIDE: 105 meq/L (ref 98–109)
CO2: 30 mEq/L — ABNORMAL HIGH (ref 22–29)
Calcium: 9.4 mg/dL (ref 8.4–10.4)
Creatinine: 0.9 mg/dL (ref 0.6–1.1)
EGFR: >90 mL/min/{1.73_m2} (ref >90)
Glucose: 103 mg/dl (ref 70–140)
Potassium: 4.3 mEq/L (ref 3.5–5.1)
Sodium: 142 mEq/L (ref 136–145)
Total Bilirubin: 0.31 mg/dL (ref 0.20–1.20)
Total Protein: 7.4 g/dL (ref 6.4–8.3)

## 2014-08-17 MED ORDER — ONDANSETRON 8 MG/NS 50 ML IVPB
INTRAVENOUS | Status: AC
  Filled 2014-08-17: qty 8

## 2014-08-17 MED ORDER — HYDROCORTISONE 2 % EX LOTN: TOPICAL_LOTION | CUTANEOUS | Status: DC

## 2014-08-17 MED ORDER — SODIUM CHLORIDE 0.9 % IV SOLN
Freq: Once | INTRAVENOUS | Status: AC
  Administered 2014-08-17: 15:00:00 via INTRAVENOUS

## 2014-08-17 MED ORDER — PACLITAXEL CHEMO INJECTION 300 MG/50ML
80.0000 mg/m2 | Freq: Once | INTRAVENOUS | Status: AC
  Administered 2014-08-17: 174 mg via INTRAVENOUS
  Filled 2014-08-17: qty 29

## 2014-08-17 MED ORDER — DEXAMETHASONE SODIUM PHOSPHATE 20 MG/5ML IJ SOLN
20.0000 mg | Freq: Once | INTRAMUSCULAR | Status: AC
  Administered 2014-08-17: 20 mg via INTRAVENOUS

## 2014-08-17 MED ORDER — FAMOTIDINE IN NACL 20-0.9 MG/50ML-% IV SOLN
INTRAVENOUS | Status: AC
  Filled 2014-08-17: qty 50

## 2014-08-17 MED ORDER — SODIUM CHLORIDE 0.9 % IJ SOLN
10.0000 mL | INTRAMUSCULAR | Status: DC | PRN
  Administered 2014-08-17: 10 mL
  Filled 2014-08-17: qty 10

## 2014-08-17 MED ORDER — DIPHENHYDRAMINE HCL 50 MG/ML IJ SOLN
INTRAMUSCULAR | Status: AC
  Filled 2014-08-17: qty 1

## 2014-08-17 MED ORDER — ONDANSETRON 8 MG/50ML IVPB (CHCC)
8.0000 mg | Freq: Once | INTRAVENOUS | Status: AC
  Administered 2014-08-17: 8 mg via INTRAVENOUS

## 2014-08-17 MED ORDER — CLINDAMYCIN PHOSPHATE 1 % EX GEL
CUTANEOUS | Status: DC
Start: 2014-08-17 — End: 2014-09-07

## 2014-08-17 MED ORDER — DEXAMETHASONE SODIUM PHOSPHATE 20 MG/5ML IJ SOLN
INTRAMUSCULAR | Status: AC
  Filled 2014-08-17: qty 5

## 2014-08-17 MED ORDER — HEPARIN SOD (PORK) LOCK FLUSH 100 UNIT/ML IV SOLN
500.0000 [IU] | Freq: Once | INTRAVENOUS | Status: AC | PRN
  Administered 2014-08-17: 500 [IU]
  Filled 2014-08-17: qty 5

## 2014-08-17 MED ORDER — DIPHENHYDRAMINE HCL 50 MG/ML IJ SOLN
50.0000 mg | Freq: Once | INTRAMUSCULAR | Status: AC
  Administered 2014-08-17: 50 mg via INTRAVENOUS

## 2014-08-17 MED ORDER — FAMOTIDINE IN NACL 20-0.9 MG/50ML-% IV SOLN
20.0000 mg | Freq: Once | INTRAVENOUS | Status: AC
  Administered 2014-08-17: 20 mg via INTRAVENOUS

## 2014-08-17 NOTE — Progress Notes (Signed)
Discharged at 39 with female friend in no distress.  Reports rash to chest and back.  Denies itching or pain.  Will go to Pharmacy to pick up ointments ordered by Dr. Burr Medico.

## 2014-08-17 NOTE — Patient Instructions (Signed)
[facility] Discharge Instructions for Patients Receiving Chemotherapy  Today you received the following chemotherapy agents: Taxol.  To help prevent nausea and vomiting after your treatment, we encourage you to take your nausea medication as prescribed.   If you develop nausea and vomiting that is not controlled by your nausea medication, call the clinic.   BELOW ARE SYMPTOMS THAT SHOULD BE REPORTED IMMEDIATELY:  *FEVER GREATER THAN 100.5 F  *CHILLS WITH OR WITHOUT FEVER  NAUSEA AND VOMITING THAT IS NOT CONTROLLED WITH YOUR NAUSEA MEDICATION  *UNUSUAL SHORTNESS OF BREATH  *UNUSUAL BRUISING OR BLEEDING  TENDERNESS IN MOUTH AND THROAT WITH OR WITHOUT PRESENCE OF ULCERS  *URINARY PROBLEMS  *BOWEL PROBLEMS  UNUSUAL RASH Items with * indicate a potential emergency and should be followed up as soon as possible.  Feel free to call the clinic you have any questions or concerns. The clinic phone number is [phone].    

## 2014-08-18 ENCOUNTER — Encounter: Payer: Self-pay | Admitting: Nurse Practitioner

## 2014-08-18 NOTE — Assessment & Plan Note (Signed)
Patient states that she has had the pustular rash to her chest and her face since initiating chemotherapy.  She feels that the rash is slowly progressing.  She denies any other hypersensitivity/allergic symptoms whatsoever.  Patient has a mild rash with some pustular lesions to her chest; as well as to her face.  It does appear that she is performing some of these lesions to the bridge of her nose as well.  Patient denies any specific pruritus to these lesions; but does admit to occasionally scratching at them.  No evidence of active infection to any of these lesions.  Will prescribe clindamycin gel and hydrocortisone cream 2% to apply alternately to the rash to see if this helps.  Advised patient to call/return if she develops any worsening symptoms whatsoever.

## 2014-08-18 NOTE — Progress Notes (Signed)
will   SYMPTOM MANAGEMENT CLINIC   HPI: [name] [name] 44 y.o. female diagnosed with breast cancer; with metastasis to the lungs.  Currently undergoing paclitaxel/Herceptin/Perjeta chemotherapy regimen.  Patient presented to the [facility] today to receive cycle 3 of her weekly paclitaxel chemotherapy.  She last received Herceptin/Perjeta on 08/03/2014.  She is complaining of a pustular rash to her chest and her face since initiating chemotherapy.  She feels the rash is slowly progressing.  She denies any specific pruritus to the rash; but does admit to occasionally scratching at it.  She denies any other allergic-type symptoms whatsoever.  She denies any recent fevers or chills.   HPI  CURRENT THERAPY: Upcoming Treatment Dates - BREAST Paclitaxel q7d Days with orders from any treatment category:  08/24/2014      SCHEDULING COMMUNICATION      diphenhydrAMINE (BENADRYL) injection 50 mg      Dexamethasone Sodium Phosphate (DECADRON) injection 20 mg      ondansetron (ZOFRAN) IVPB 8 mg      famotidine (PEPCID) IVPB 20 mg      PACLitaxel (TAXOL) 174 mg in dextrose 5 % 250 mL chemo infusion (</= 45m/m2)      sodium chloride 0.9 % injection 10 mL      heparin lock flush 100 unit/mL      heparin lock flush 100 unit/mL      alteplase (CATHFLO ACTIVASE) injection 2 mg      sodium chloride 0.9 % injection 3 mL      Cold Pack 1 packet      diphenhydrAMINE (BENADRYL) injection 25 mg      famotidine (PEPCID) IVPB 20 mg      0.9 %  sodium chloride infusion      methylPREDNISolone sodium succinate (SOLU-MEDROL) 125 mg/2 mL injection 125 mg      EPINEPHrine (ADRENALIN) 0.1 MG/ML injection 0.25 mg      EPINEPHrine (ADRENALIN) 0.1 MG/ML injection 0.25 mg      EPINEPHrine (ADRENALIN) injection 0.5 mg      EPINEPHrine (ADRENALIN) injection 0.5 mg      diphenhydrAMINE (BENADRYL) injection 50 mg      albuterol (PROVENTIL) (2.5 MG/3ML) 0.083% nebulizer solution 2.5 mg      0.9 %  sodium chloride  infusion      TREATMENT CONDITIONS 08/31/2014      SCHEDULING COMMUNICATION      diphenhydrAMINE (BENADRYL) injection 50 mg      Dexamethasone Sodium Phosphate (DECADRON) injection 20 mg      ondansetron (ZOFRAN) IVPB 8 mg      famotidine (PEPCID) IVPB 20 mg      PACLitaxel (TAXOL) 174 mg in dextrose 5 % 250 mL chemo infusion (</= 879mm2)      sodium chloride 0.9 % injection 10 mL      heparin lock flush 100 unit/mL      heparin lock flush 100 unit/mL      alteplase (CATHFLO ACTIVASE) injection 2 mg      sodium chloride 0.9 % injection 3 mL      Cold Pack 1 packet      diphenhydrAMINE (BENADRYL) injection 25 mg      famotidine (PEPCID) IVPB 20 mg      0.9 %  sodium chloride infusion      methylPREDNISolone sodium succinate (SOLU-MEDROL) 125 mg/2 mL injection 125 mg      EPINEPHrine (ADRENALIN) 0.1 MG/ML injection 0.25 mg      EPINEPHrine (ADRENALIN) 0.1 MG/ML injection  0.25 mg      EPINEPHrine (ADRENALIN) injection 0.5 mg      EPINEPHrine (ADRENALIN) injection 0.5 mg      diphenhydrAMINE (BENADRYL) injection 50 mg      albuterol (PROVENTIL) (2.5 MG/3ML) 0.083% nebulizer solution 2.5 mg      0.9 %  sodium chloride infusion      TREATMENT CONDITIONS 09/07/2014      SCHEDULING COMMUNICATION      diphenhydrAMINE (BENADRYL) injection 50 mg      Dexamethasone Sodium Phosphate (DECADRON) injection 20 mg      ondansetron (ZOFRAN) IVPB 8 mg      famotidine (PEPCID) IVPB 20 mg      PACLitaxel (TAXOL) 174 mg in dextrose 5 % 250 mL chemo infusion (</= 47m/m2)      sodium chloride 0.9 % injection 10 mL      heparin lock flush 100 unit/mL      heparin lock flush 100 unit/mL      alteplase (CATHFLO ACTIVASE) injection 2 mg      sodium chloride 0.9 % injection 3 mL      Cold Pack 1 packet      diphenhydrAMINE (BENADRYL) injection 25 mg      famotidine (PEPCID) IVPB 20 mg      0.9 %  sodium chloride infusion      methylPREDNISolone sodium succinate (SOLU-MEDROL) 125 mg/2 mL injection 125  mg      EPINEPHrine (ADRENALIN) 0.1 MG/ML injection 0.25 mg      EPINEPHrine (ADRENALIN) 0.1 MG/ML injection 0.25 mg      EPINEPHrine (ADRENALIN) injection 0.5 mg      EPINEPHrine (ADRENALIN) injection 0.5 mg      diphenhydrAMINE (BENADRYL) injection 50 mg      albuterol (PROVENTIL) (2.5 MG/3ML) 0.083% nebulizer solution 2.5 mg      0.9 %  sodium chloride infusion      TREATMENT CONDITIONS    ROS  Past Medical History  Diagnosis Date  . Breast cancer   . Pneumonia     hx of pneumonia 08/2013   . GERD (gastroesophageal reflux disease)     during pregnancy   . Breast cancer     Past Surgical History  Procedure Laterality Date  . Cesarean section    . Essure tubal ligation    . Cholecystectomy    . Wisdom tooth extraction    . Portacath placement Left 07/21/2014    Procedure: INSERTION PORT-A-CATH;  Surgeon: FStark Klein MD;  Location: [facility];  Service: General;  Laterality: Left;    has Breast cancer 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; Family history of breast cancer; Lung mas bilaterial; Fever; and Rash on her problem list.     is allergic to aspirin and codeine.    Medication List       This list is accurate as of: 08/17/14 11:59 PM.  Always use your most recent med list.               albuterol 108 (90 BASE) MCG/ACT inhaler  Commonly known as:  PROVENTIL HFA;VENTOLIN HFA  Inhale 2 puffs into the lungs every 4 (four) hours as needed for wheezing or shortness of breath.     benzonatate 100 MG capsule  Commonly known as:  TESSALON  Take 1 capsule (100 mg total) by mouth 3 (three) times daily as needed for cough.     cetirizine 10 MG tablet  Commonly known as:  ZYRTEC  Take 10 mg by mouth daily as  needed for allergies.     clindamycin 1 % gel  Commonly known as:  CLINDAGEL  Apply topically to affected areas BID.     HYDROCORTISONE (TOPICAL) 2 % Lotn  Apply topically to affected areas BID.     ibuprofen 200 MG tablet  Commonly known as:  ADVIL,MOTRIN  Take 400-800 mg by  mouth every 6 (six) hours as needed for headache or moderate pain.     ibuprofen 800 MG tablet  Commonly known as:  ADVIL,MOTRIN  Take 800 mg by mouth as needed.     lidocaine-prilocaine cream  Commonly known as:  EMLA  Apply 1 application topically as needed. Apply to portacath 1 1/2  -  2  Hours prior to procedures as needed.     metaxalone 800 MG tablet  Commonly known as:  SKELAXIN  Take 800 mg by mouth 3 (three) times daily as needed for muscle spasms.     NYQUIL PO  Take 30 mLs by mouth 2 (two) times daily as needed (cold/allergies).     ondansetron 8 MG tablet  Commonly known as:  ZOFRAN  Take 1 tablet (8 mg total) by mouth every 8 (eight) hours as needed for nausea or vomiting.     oxyCODONE-acetaminophen 5-325 MG per tablet  Commonly known as:  ROXICET  Take 1-2 tablets by mouth every 4 (four) hours as needed for severe pain.     TRIPLE ANTIBIOTIC EX  Apply 1 application topically 2 (two) times daily as needed (eczema on hands.).     VICKS VAPORUB EX  Apply 1 application topically at bedtime as needed (sinus). Applies under nose, throat and on chest.         PHYSICAL EXAMINATION  Last menstrual period 07/18/2014.  122/75, 76, 98.6  Physical Exam  Constitutional: She is oriented to person, place, and time and well-developed, well-nourished, and in no distress.  HENT:  Head: Normocephalic and atraumatic.  Mouth/Throat: Oropharynx is clear and moist.  Eyes: Conjunctivae and EOM are normal. Pupils are equal, round, and reactive to light. Right eye exhibits no discharge. Left eye exhibits no discharge. No scleral icterus.  Neck: Normal range of motion.  Pulmonary/Chest: Effort normal. No respiratory distress.  Musculoskeletal: Normal range of motion.  Neurological: She is alert and oriented to person, place, and time. Gait normal.  Skin: Skin is warm and dry. Rash noted. No erythema.  Patient does have a fine, scattered mild rash to her chest and to her face.   Some of the lesions do have pustular caps.  No evidence of active infection.  Psychiatric: Affect normal.  Nursing note and vitals reviewed.   LABORATORY DATA:. Appointment on 08/17/2014  Component Date Value Ref Range Status  . WBC 08/17/2014 5.2  3.9 - 10.3 10e3/uL Final  . NEUT# 08/17/2014 2.6  1.5 - 6.5 10e3/uL Final  . HGB 08/17/2014 11.5* 11.6 - 15.9 g/dL Final  . HCT 08/17/2014 35.8  34.8 - 46.6 % Final  . Platelets 08/17/2014 292  145 - 400 10e3/uL Final  . MCV 08/17/2014 86.9  79.5 - 101.0 fL Final  . MCH 08/17/2014 28.0  25.1 - 34.0 pg Final  . MCHC 08/17/2014 32.2  31.5 - 36.0 g/dL Final  . RBC 08/17/2014 4.13  3.70 - 5.45 10e6/uL Final  . RDW 08/17/2014 13.6  11.2 - 14.5 % Final  . lymph# 08/17/2014 2.1  0.9 - 3.3 10e3/uL Final  . MONO# 08/17/2014 0.4  0.1 - 0.9 10e3/uL Final  . Eosinophils  Absolute 08/17/2014 0.1  0.0 - 0.5 10e3/uL Final  . Basophils Absolute 08/17/2014 0.0  0.0 - 0.1 10e3/uL Final  . NEUT% 08/17/2014 49.2  38.4 - 76.8 % Final  . LYMPH% 08/17/2014 40.0  14.0 - 49.7 % Final  . MONO% 08/17/2014 7.6  0.0 - 14.0 % Final  . EOS% 08/17/2014 2.5  0.0 - 7.0 % Final  . BASO% 08/17/2014 0.7  0.0 - 2.0 % Final  . Sodium 08/17/2014 142  136 - 145 mEq/L Final  . Potassium 08/17/2014 4.3  3.5 - 5.1 mEq/L Final  . Chloride 08/17/2014 105  98 - 109 mEq/L Final  . CO2 08/17/2014 30* 22 - 29 mEq/L Final  . Glucose 08/17/2014 103  70 - 140 mg/dl Final  . BUN 08/17/2014 13.0  7.0 - 26.0 mg/dL Final  . Creatinine 08/17/2014 0.9  0.6 - 1.1 mg/dL Final  . Total Bilirubin 08/17/2014 0.31  0.20 - 1.20 mg/dL Final  . Alkaline Phosphatase 08/17/2014 68  40 - 150 U/L Final  . AST 08/17/2014 16  5 - 34 U/L Final  . ALT 08/17/2014 22  0 - 55 U/L Final  . Total Protein 08/17/2014 7.4  6.4 - 8.3 g/dL Final  . Albumin 08/17/2014 3.7  3.5 - 5.0 g/dL Final  . Calcium 08/17/2014 9.4  8.4 - 10.4 mg/dL Final  . Anion Gap 08/17/2014 7  3 - 11 mEq/L Final  . EGFR 08/17/2014 >90  >90  ml/min/1.73 m2 Final   eGFR is calculated using the CKD-EPI Creatinine Equation (2009)     RADIOGRAPHIC STUDIES: No results found.  ASSESSMENT/PLAN:    Breast cancer 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 Patient received cycle 2 of her weekly paclitaxel on 08/10/2014.  She received the Herceptin/Perjeta infusion on 08/03/2014.  She will proceed today with cycle 3 of her weekly paclitaxel.  She'll return on 08/24/2014 for her weekly paclitaxel; as well as her Herceptin/Perjeta infusion.  In regards to her chemotherapy plan-pustular rash does not appear to be a true hypersensitivity reaction; but may very well be a side effect of her chemotherapy.  Patient had no other new symptoms whatsoever.  Rash Patient states that she has had the pustular rash to her chest and her face since initiating chemotherapy.  She feels that the rash is slowly progressing.  She denies any other hypersensitivity/allergic symptoms whatsoever.  Patient has a mild rash with some pustular lesions to her chest; as well as to her face.  It does appear that she is performing some of these lesions to the bridge of her nose as well.  Patient denies any specific pruritus to these lesions; but does admit to occasionally scratching at them.  No evidence of active infection to any of these lesions.  Will prescribe clindamycin gel and hydrocortisone cream 2% to apply alternately to the rash to see if this helps.  Advised patient to call/return if she develops any worsening symptoms whatsoever.  Patient stated understanding of all instructions; and was in agreement with this plan of care. The patient knows to call the clinic with any problems, questions or concerns.   Review/collaboration with Dr. Burr Medico regarding all aspects of patient's visit today.   Total time spent with patient was 25 minutes;  with greater than 75 percent of that time spent in face to face counseling regarding her symptoms,  and coordination of care and follow up.  Disclaimer:  This note was dictated with voice recognition software. Similar sounding words can inadvertently be transcribed and may  not be corrected upon review.   Drue Second, NP 08/18/2014

## 2014-08-18 NOTE — Assessment & Plan Note (Signed)
Patient received cycle 2 of her weekly paclitaxel on 08/10/2014.  She received the Herceptin/Perjeta infusion on 08/03/2014.  She will proceed today with cycle 3 of her weekly paclitaxel.  She'll return on 08/24/2014 for her weekly paclitaxel; as well as her Herceptin/Perjeta infusion.  In regards to her chemotherapy plan-pustular rash does not appear to be a true hypersensitivity reaction; but may very well be a side effect of her chemotherapy.  Patient had no other new symptoms whatsoever.

## 2014-08-19 ENCOUNTER — Encounter: Payer: Self-pay | Admitting: Genetic Counselor

## 2014-08-19 DIAGNOSIS — C50911 Malignant neoplasm of unspecified site of right female breast: Secondary | ICD-10-CM

## 2014-08-19 DIAGNOSIS — C7800 Secondary malignant neoplasm of unspecified lung: Secondary | ICD-10-CM

## 2014-08-19 DIAGNOSIS — Z803 Family history of malignant neoplasm of breast: Secondary | ICD-10-CM

## 2014-08-19 NOTE — Progress Notes (Signed)
[facility]  Genetic Test Results    REFERRING PROVIDER: Dr. [name]  PRIMARY PROVIDER:  [name], DO  PRIMARY REASON FOR VISIT:  Personal and family history of breast cancer  GENETIC TEST RESULT:  Testing Laboratory: [facility]  Test Ordered: BreastNext gene panel Date of Report: 08/18/2013 Result: Normal, no pathogenic mutations identified General Interpretation: Negative genetic test but family history is still concerning for a hereditary predisposition to breast cancer.  HPI: Ms. Nairn was previously seen in the [facility] due to concerns regarding a hereditary predisposition to cancer. Please refer to our prior cancer genetics clinic note for more information regarding Ms. Speedy's medical, social and family histories, and our assessment and recommendations, at the time. Ms. Coley genetic test results and recommendations warranted by these results were recently disclosed to her and are discussed in more detail below.  GENETIC TEST RESULTS: At the time of Ms. Silbaugh's visit, we recommended she pursue genetic testing, which includes sequencing and deletion/duplication analysis of several genes associated with an increased risk for cancer via a gene panel offered by [employer] called "BreastNext". The BreastNext gene panel offered by [employer] includes sequencing and rearrangement analysis for the following 17 genes: ATM, BARD1, BRCA1, BRCA2, BRIP1, CDH1, CHEK2, MRE11A, MUTYH, NBN, NF1, PALB2, PTEN, RAD50, RAD51C, RAD51D, and TP53. Genetic testing for this gene panel was normal and did not reveal a pathogenic mutation in any of these genes. A copy of the genetic test report will be scanned into Epic under the media tab.   We discussed with Ms. Mcfadden that current genetic testing is not perfect, and it is, therefore, possible there may be a pathogenic gene mutation in one of these genes that  current testing cannot detect, but that chance is small.  We also discussed, that it is possible that another gene that has not yet been discovered, or that we have not yet tested, is responsible for the cancer diagnoses in her family. It is, therefore, important for Ms. Kostka to continue to remain in touch with cancer genetics so that we can continue to offer Ms. Bambach the most up to date genetic testing.   CANCER SCREENING RECOMMENDATIONS: Given Ms. Claxton's personal and family histories, we must interpret these negative results with some caution.  Families with features suggestive of hereditary risk for cancer tend to have multiple family members with cancer, diagnoses in multiple generations and diagnoses before the age of 42. Ms. Demetrius family exhibits some of these features and is, therefore, still somewhat concerning for a hereditary cancer syndrome associated with an increased risk for breast cancer. This result may simply reflect our current inability to detect all mutations within these genes or there may be a different gene that has not yet been discovered or tested. We, therefore, discussed that it is reasonable for Ms. Anselmo to continue to follow the cancer screening and management recommendations given by her oncology and primary healthcare providers.   RECOMMENDATIONS FOR FAMILY MEMBERS: Given the family history, Ms. Silos relatives may also be at increased risk for cancer over the general population.  Female relatives in this family should consider being followed at a high-risk breast cancer clinic, have a yearly clinical breast exam, mammograms beginning at age 97, and perform monthly breast self-exams.These relatives should also discuss the utility of breast MRI screening, the recommended frequency of a gynecological exam, ovarian cancer screening options and surgical options available, with their primary  healthcare provider and/or gynecologist. All family members should have a  colonoscopy by age 22, an annual dermatological exam and continue to follow cancer screening guidelines recommended by their healthcare provider.  FOLLOW-UP: Lastly, we discussed with Ms. Dalporto that cancer genetics is a rapidly advancing field and it is likely that new genetic tests will be appropriate for her and/or family members in the future. We encouraged her to remain in contact with cancer genetics on an annual basis so we can update her personal and family histories and let her know of advances in cancer genetics that may benefit this family.   Our contact number was provided. Ms. Azizi questions were answered to her satisfaction, and she knows she is welcome to call us at anytime with additional questions or concerns.    Catherine A. Fine, MS, CGC Certified Psychologist, [email or web address] Phone: [phone]

## 2014-08-24 ENCOUNTER — Encounter: Payer: Self-pay | Admitting: Hematology

## 2014-08-24 ENCOUNTER — Telehealth: Payer: Self-pay | Admitting: Hematology

## 2014-08-24 ENCOUNTER — Ambulatory Visit (HOSPITAL_BASED_OUTPATIENT_CLINIC_OR_DEPARTMENT_OTHER): Payer: BLUE CROSS/BLUE SHIELD

## 2014-08-24 ENCOUNTER — Ambulatory Visit: Payer: BC Managed Care – PPO

## 2014-08-24 ENCOUNTER — Other Ambulatory Visit: Payer: BC Managed Care – PPO

## 2014-08-24 ENCOUNTER — Ambulatory Visit (HOSPITAL_BASED_OUTPATIENT_CLINIC_OR_DEPARTMENT_OTHER): Payer: BLUE CROSS/BLUE SHIELD | Admitting: Hematology

## 2014-08-24 ENCOUNTER — Other Ambulatory Visit (HOSPITAL_BASED_OUTPATIENT_CLINIC_OR_DEPARTMENT_OTHER): Payer: BLUE CROSS/BLUE SHIELD

## 2014-08-24 VITALS — BP 146/78 | HR 72 | Temp 98.3°F | Resp 18 | Ht 70.0 in | Wt 213.4 lb

## 2014-08-24 DIAGNOSIS — C50311 Malignant neoplasm of lower-inner quadrant of right female breast: Secondary | ICD-10-CM

## 2014-08-24 DIAGNOSIS — Z5111 Encounter for antineoplastic chemotherapy: Secondary | ICD-10-CM

## 2014-08-24 DIAGNOSIS — C7800 Secondary malignant neoplasm of unspecified lung: Principal | ICD-10-CM

## 2014-08-24 DIAGNOSIS — Z5112 Encounter for antineoplastic immunotherapy: Secondary | ICD-10-CM

## 2014-08-24 DIAGNOSIS — C50911 Malignant neoplasm of unspecified site of right female breast: Secondary | ICD-10-CM

## 2014-08-24 DIAGNOSIS — C7802 Secondary malignant neoplasm of left lung: Secondary | ICD-10-CM

## 2014-08-24 DIAGNOSIS — C7801 Secondary malignant neoplasm of right lung: Secondary | ICD-10-CM

## 2014-08-24 DIAGNOSIS — R21 Rash and other nonspecific skin eruption: Secondary | ICD-10-CM

## 2014-08-24 LAB — CBC WITH DIFFERENTIAL/PLATELET
BASO%: 0.8 % (ref 0.0–2.0)
Basophils Absolute: 0 10*3/uL (ref 0.0–0.1)
EOS%: 0.8 % (ref 0.0–7.0)
Eosinophils Absolute: 0 10*3/uL (ref 0.0–0.5)
HCT: 34.1 % — ABNORMAL LOW (ref 34.8–46.6)
HGB: 11.4 g/dL — ABNORMAL LOW (ref 11.6–15.9)
LYMPH%: 33.9 % (ref 14.0–49.7)
MCH: 28.5 pg (ref 25.1–34.0)
MCHC: 33.4 g/dL (ref 31.5–36.0)
MCV: 85.3 fL (ref 79.5–101.0)
MONO#: 0.3 10*3/uL (ref 0.1–0.9)
MONO%: 5.9 % (ref 0.0–14.0)
NEUT%: 58.6 % (ref 38.4–76.8)
NEUTROS ABS: 3 10*3/uL (ref 1.5–6.5)
PLATELETS: 279 10*3/uL (ref 145–400)
RBC: 4 10*6/uL (ref 3.70–5.45)
RDW: 13.5 % (ref 11.2–14.5)
WBC: 5.1 10*3/uL (ref 3.9–10.3)
lymph#: 1.7 10*3/uL (ref 0.9–3.3)

## 2014-08-24 LAB — COMPREHENSIVE METABOLIC PANEL (CC13)
ALBUMIN: 3.6 g/dL (ref 3.5–5.0)
ALT: 22 U/L (ref 0–55)
AST: 15 U/L (ref 5–34)
Alkaline Phosphatase: 66 U/L (ref 40–150)
Anion Gap: 5 mEq/L (ref 3–11)
BILIRUBIN TOTAL: 0.53 mg/dL (ref 0.20–1.20)
BUN: 8.1 mg/dL (ref 7.0–26.0)
CHLORIDE: 107 meq/L (ref 98–109)
CO2: 29 mEq/L (ref 22–29)
Calcium: 8.9 mg/dL (ref 8.4–10.4)
Creatinine: 0.8 mg/dL (ref 0.6–1.1)
EGFR: >90 mL/min/{1.73_m2} (ref >90)
Glucose: 87 mg/dl (ref 70–140)
Potassium: 4.1 mEq/L (ref 3.5–5.1)
SODIUM: 140 meq/L (ref 136–145)
TOTAL PROTEIN: 6.9 g/dL (ref 6.4–8.3)

## 2014-08-24 MED ORDER — METHYLPREDNISOLONE SODIUM SUCC 40 MG IJ SOLR
INTRAMUSCULAR | Status: AC
Start: 2014-08-24 — End: 2014-08-24
  Filled 2014-08-24: qty 1

## 2014-08-24 MED ORDER — FAMOTIDINE IN NACL 20-0.9 MG/50ML-% IV SOLN
INTRAVENOUS | Status: AC
  Filled 2014-08-24: qty 50

## 2014-08-24 MED ORDER — DEXAMETHASONE SODIUM PHOSPHATE 20 MG/5ML IJ SOLN
INTRAMUSCULAR | Status: AC
  Filled 2014-08-24: qty 5

## 2014-08-24 MED ORDER — ACETAMINOPHEN 325 MG PO TABS
650.0000 mg | ORAL_TABLET | Freq: Once | ORAL | Status: AC
  Administered 2014-08-24: 650 mg via ORAL

## 2014-08-24 MED ORDER — FAMOTIDINE IN NACL 20-0.9 MG/50ML-% IV SOLN
20.0000 mg | Freq: Once | INTRAVENOUS | Status: AC
  Administered 2014-08-24: 20 mg via INTRAVENOUS

## 2014-08-24 MED ORDER — PACLITAXEL CHEMO INJECTION 300 MG/50ML
80.0000 mg/m2 | Freq: Once | INTRAVENOUS | Status: AC
  Administered 2014-08-24: 174 mg via INTRAVENOUS
  Filled 2014-08-24: qty 29

## 2014-08-24 MED ORDER — ONDANSETRON 8 MG/50ML IVPB (CHCC)
8.0000 mg | Freq: Once | INTRAVENOUS | Status: AC
  Administered 2014-08-24: 8 mg via INTRAVENOUS

## 2014-08-24 MED ORDER — DIPHENHYDRAMINE HCL 25 MG PO CAPS
50.0000 mg | ORAL_CAPSULE | Freq: Once | ORAL | Status: AC
  Administered 2014-08-24: 50 mg via ORAL

## 2014-08-24 MED ORDER — SODIUM CHLORIDE 0.9 % IV SOLN
Freq: Once | INTRAVENOUS | Status: AC
  Administered 2014-08-24: 10:00:00 via INTRAVENOUS

## 2014-08-24 MED ORDER — DIPHENHYDRAMINE HCL 25 MG PO CAPS
ORAL_CAPSULE | ORAL | Status: AC
  Filled 2014-08-24: qty 2

## 2014-08-24 MED ORDER — HEPARIN SOD (PORK) LOCK FLUSH 100 UNIT/ML IV SOLN
500.0000 [IU] | Freq: Once | INTRAVENOUS | Status: AC | PRN
  Administered 2014-08-24: 500 [IU]
  Filled 2014-08-24: qty 5

## 2014-08-24 MED ORDER — DIPHENHYDRAMINE HCL 50 MG/ML IJ SOLN: 50.0000 mg | Freq: Once | INTRAMUSCULAR | Status: DC

## 2014-08-24 MED ORDER — ONDANSETRON 8 MG/NS 50 ML IVPB
INTRAVENOUS | Status: AC
  Filled 2014-08-24: qty 8

## 2014-08-24 MED ORDER — SODIUM CHLORIDE 0.9 % IJ SOLN
10.0000 mL | INTRAMUSCULAR | Status: DC | PRN
  Administered 2014-08-24: 10 mL
  Filled 2014-08-24: qty 10

## 2014-08-24 MED ORDER — METHYLPREDNISOLONE SODIUM SUCC 40 MG IJ SOLR
40.0000 mg | Freq: Once | INTRAMUSCULAR | Status: AC
  Administered 2014-08-24: 40 mg via INTRAVENOUS

## 2014-08-24 MED ORDER — DEXAMETHASONE SODIUM PHOSPHATE 20 MG/5ML IJ SOLN
20.0000 mg | Freq: Once | INTRAMUSCULAR | Status: AC
  Administered 2014-08-24: 20 mg via INTRAVENOUS

## 2014-08-24 MED ORDER — ACETAMINOPHEN 325 MG PO TABS
ORAL_TABLET | ORAL | Status: AC
  Filled 2014-08-24: qty 2

## 2014-08-24 MED ORDER — TRASTUZUMAB CHEMO INJECTION 440 MG
6.0000 mg/kg | Freq: Once | INTRAVENOUS | Status: AC
  Administered 2014-08-24: 588 mg via INTRAVENOUS
  Filled 2014-08-24: qty 28

## 2014-08-24 MED ORDER — SODIUM CHLORIDE 0.9 % IV SOLN
420.0000 mg | Freq: Once | INTRAVENOUS | Status: AC
  Administered 2014-08-24: 420 mg via INTRAVENOUS
  Filled 2014-08-24: qty 14

## 2014-08-24 NOTE — Patient Instructions (Signed)
[facility] Discharge Instructions for Patients Receiving Chemotherapy  Today you received the following chemotherapy agents Herceptin, Perjeta and Taxol.  To help prevent nausea and vomiting after your treatment, we encourage you to take your nausea medication as prescribed.  If you develop nausea and vomiting that is not controlled by your nausea medication, call the clinic.   BELOW ARE SYMPTOMS THAT SHOULD BE REPORTED IMMEDIATELY:  *FEVER GREATER THAN 100.5 F  *CHILLS WITH OR WITHOUT FEVER  NAUSEA AND VOMITING THAT IS NOT CONTROLLED WITH YOUR NAUSEA MEDICATION  *UNUSUAL SHORTNESS OF BREATH  *UNUSUAL BRUISING OR BLEEDING  TENDERNESS IN MOUTH AND THROAT WITH OR WITHOUT PRESENCE OF ULCERS  *URINARY PROBLEMS  *BOWEL PROBLEMS  UNUSUAL RASH Items with * indicate a potential emergency and should be followed up as soon as possible.  Feel free to call the clinic you have any questions or concerns. The clinic phone number is [phone].

## 2014-08-24 NOTE — Progress Notes (Signed)
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 MRI breast on 07/16/2014 showed 7cm R breast lesion and 4.4cm r axillary node    07/02/2014 Imaging CT CAP: a 4.7cm mass in LUL lung and a 2.1cm mas in RML, and a small nodule in RUL, suspecious for metastasis     07/09/2014 Initial Diagnosis right IDA with b/l lung lesions, ER-/PR-/HER2+   07/09/2014 Initial Biopsy US guided right breast mass and axillary node biopsy showed IDA, and DCIS, ER-/PR-/HER2+   CURRENT THERAPY: weekly Paclitaxel $RemoveBeforeDEI'80mg'RfwqOpHxziVHNmnH$ /m2, trastuzumab and 2 to map every 3 weeks, started on 08/04/2014  INTERVAL HISTROY   [name] [name] returns for follow-up and 4th dose of chemo. She developed some acne like rash on her face and neck after about 10 days ago, does not itch or bother. She was seen by NP Cyndee and we gave her a prescription of 2% hydrocortisone which helped. She also noticed small but diffuse skin rash on her both hand, with some itchyness, similar to eczema which she had before. She had less diarrhea with second and third dose Taxol, happened a few times and she used imodium. (+) intermittent abdominal cramps, which lasts a few minutes only. (+) alopecia, and she got a wig now. She otherwise feels well, eats well, still work full time.   MEDICAL HISTORY:  Past Medical  History  Diagnosis Date  . Breast cancer   . Pneumonia     hx of pneumonia 08/2013   . GERD (gastroesophageal reflux disease)     during pregnancy   . Breast cancer     SURGICAL HISTORY: Past Surgical History  Procedure Laterality Date  . Cesarean section    . Essure tubal ligation    . Cholecystectomy    . Wisdom tooth extraction    . Portacath placement Left 07/21/2014    Procedure: INSERTION PORT-A-CATH;  Surgeon: Stark Klein, MD;  Location: [facility];  Service: General;  Laterality: Left;    SOCIAL HISTORY: History   Social History  . Marital Status: Married    Spouse Name: N/A    Number of Children:  she has 2 daughters at the age of 36 and 70.   . Years of Education: N/A   Occupational History  .  works as Freight forwarder for a [employer].    Social History Main Topics  . Smoking status: Never Smoker   . Smokeless tobacco: Not on file  . Alcohol Use: Yes  . Drug Use: No  . Sexual Activity: No    FAMILY HISTORY: Family History  Problem Relation Age of Onset  . Breast cancer Mother 36  . Liver cancer Father 101  . Breast cancer Maternal Aunt 36  . Prostate cancer Maternal Grandfather   . Liver cancer Paternal Grandmother   . Prostate cancer Paternal Grandfather       ALLERGIES:  is allergic  to aspirin and codeine.  MEDICATIONS:  Current Outpatient Prescriptions  Medication Sig Dispense Refill  . albuterol (PROVENTIL HFA;VENTOLIN HFA) 108 (90 BASE) MCG/ACT inhaler Inhale 2 puffs into the lungs every 4 (four) hours as needed for wheezing or shortness of breath.    . benzonatate (TESSALON) 100 MG capsule Take 1 capsule (100 mg total) by mouth 3 (three) times daily as needed for cough. 21 capsule 0  . Camphor-Eucalyptus-Menthol (VICKS VAPORUB EX) Apply 1 application topically at bedtime as needed (sinus). Applies under nose, throat and on chest.    . cetirizine (ZYRTEC) 10 MG tablet Take 10 mg by mouth daily as needed for allergies.     . clindamycin  (CLINDAGEL) 1 % gel Apply topically to affected areas BID. 30 g 0  . HYDROCORTISONE, TOPICAL, 2 % LOTN Apply topically to affected areas BID. 29.6 mL 0  . ibuprofen (ADVIL,MOTRIN) 200 MG tablet Take 400-800 mg by mouth every 6 (six) hours as needed for headache or moderate pain.    Marland Kitchen ibuprofen (ADVIL,MOTRIN) 800 MG tablet Take 800 mg by mouth as needed.  0  . lidocaine-prilocaine (EMLA) cream Apply 1 application topically as needed. Apply to portacath 1 1/2  -  2  Hours prior to procedures as needed. 30 g 1  . loperamide (IMODIUM A-D) 2 MG tablet Take 2 mg by mouth 4 (four) times daily as needed for diarrhea or loose stools.    . metaxalone (SKELAXIN) 800 MG tablet Take 800 mg by mouth 3 (three) times daily as needed for muscle spasms.    . Neomycin-Bacitracin-Polymyxin (TRIPLE ANTIBIOTIC EX) Apply 1 application topically 2 (two) times daily as needed (eczema on hands.).    Marland Kitchen ondansetron (ZOFRAN) 8 MG tablet Take 1 tablet (8 mg total) by mouth every 8 (eight) hours as needed for nausea or vomiting. 30 tablet 3  . oxyCODONE-acetaminophen (ROXICET) 5-325 MG per tablet Take 1-2 tablets by mouth every 4 (four) hours as needed for severe pain. 30 tablet 0  . Pseudoeph-Doxylamine-DM-APAP (NYQUIL PO) Take 30 mLs by mouth 2 (two) times daily as needed (cold/allergies).      No current facility-administered medications for this visit.    REVIEW OF SYSTEMS:   Constitutional: Denies fevers, chills or abnormal night sweats, (+) malaise  Eyes: Denies blurriness of vision, double vision or watery eyes Ears, nose, mouth, throat, and face: Denies mucositis or sore throat Respiratory: (+) dry cough, no dyspnea or wheezes Cardiovascular: Denies palpitation, chest discomfort or lower extremity swelling Gastrointestinal:  Denies nausea, heartburn or change in bowel habits Skin: Denies abnormal skin rashes Lymphatics: Denies new lymphadenopathy or easy bruising Neurological:Denies numbness, tingling or new  weaknesses Behavioral/Psych: Mood is stable, no new changes  All other systems were reviewed with the patient and are negative.  PHYSICAL EXAMINATION: ECOG PERFORMANCE STATUS: 0 - Asymptomatic  Filed Vitals:   08/24/14 0910  BP: 146/78  Pulse: 72  Temp: 98.3 F (36.8 C)  Resp: 18   Filed Weights   08/24/14 0910  Weight: 213 lb 6.4 oz (96.798 kg)    GENERAL:alert, no distress and comfortable SKIN: skin color, texture, turgor are normal, no rashes or significant lesions EYES: normal, conjunctiva are pink and non-injected, sclera clear OROPHARYNX:no exudate, no erythema and lips, buccal mucosa, and tongue normal  NECK: supple, thyroid normal size, non-tender, without nodularity LYMPH:  no palpable lymphadenopathy in the cervical, axillary or inguinal LUNGS: clear to auscultation and percussion with normal breathing effort HEART: regular rate & rhythm and  no murmurs and no lower extremity edema ABDOMEN:abdomen soft, non-tender and normal bowel sounds Musculoskeletal:no cyanosis of digits and no clubbing  PSYCH: alert & oriented x 3 with fluent speech NEURO: no focal motor/sensory deficits Breasts: Breast inspection showed them to be symmetrical with no skin change or nipple discharge. Palpation of the right breast showed a 3X4.5cm mass in the lower inner quadrant, which is softer than before, and the previously palpable right axillary node is not palpable anymore except some fullness. Left breast  and axilla revealed no obvious mass that I could appreciate. Skin: a few macular skin rashes on face and upper chest, healing, diffuse tiny papular skin rash on her hands and between fingers, with skin warmness, no skin ulcers or discharge.    LABORATORY DATA:  I have reviewed the data as listed Lab Results  Component Value Date   WBC 5.1 08/24/2014   HGB 11.4* 08/24/2014   HCT 34.1* 08/24/2014   MCV 85.3 08/24/2014   PLT 279 08/24/2014    Recent Labs  08/03/14 1149  08/10/14 1013 08/17/14 1427  NA 141 139 142  K 3.9 4.2 4.3  CO2 27 27 30*  GLUCOSE 95 104 103  BUN 11.3 11.9 13.0  CREATININE 0.9 0.8 0.9  CALCIUM 9.3 9.2 9.4  PROT 7.4 7.6 7.4  ALBUMIN 3.5 3.7 3.7  AST _0 ALT 17 32 22  ALKPHOS 71 65 68  BILITOT 0.30 0.66 0.31   PATHOLOGY REPORT 07/09/2014 #1 breast, right needle core biopsy more anterior -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 45)  Lung, biopsy, Left 07/26/2014 - HIGH GRADE CARCINOMA, SEE COMMENT. Microscopic Comment The carcinoma demonstrates the following immunophenotype: TTF-1 - negative expression. Napsin A- negative expression. CK5/6 - focal, moderate expression. estrogen receptor - negative expression. GCDFP- negative expression. The recent breast biopsy demonstrating Her2 amplified high grade invasive mammary carcinoma is noted ([id number]). Although the immunophenotype of the current case is non-sepcific, it strongly argues against primary lung adenocarcinoma. However, on re-review, the morphology of the current case is essentially identical to the primary mammary carcinoma. In lieu of further immunophenotyping, the tumor will be submitted for Her2 testing and the remaining tissue will be reserved for additional ancillary tumor testing. The results of the Her2 testing will be reported in an addendum. The case was discussed with Dr Burr Medico on 07/28/2015 and 07/29/2015 HER2 POSITIVE    RADIOGRAPHIC STUDIES: CT CAP 07/20/2014 IMPRESSION: 1. Right breast mass is compatible with known malignancy. Adjacent soft tissue masses may represent satellite lesions. Recommend correlation with mammography, ultrasound or MRI. 2. Left upper lobe, right middle lobe and bilateral lower lobe pulmonary  masses/nodules are concerning for metastatic disease. 3. Right axillary and left hilar adenopathy are concerning for metastatic disease. 4. 3 mm nonobstructing right renal stone.   ECHO 12/8 Impressions: - Normal biventricular size and systolic function. Impaired relaxation with normal filling pressures. Mild tricupsid regurgitation with normal RVSP.  ASSESSMENT & PLAN:  44 year old African-American female, premenopausal, without significant past medical history, presented with palpable right breast mass and right axillary mass.   1. 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 -  continue weekly paclitaxel, trastuzumab and pertuzumab every 3 weeks. I'll evaluate her response to the treatment in 2-3 months by a restaging CT scan, I will likely to continue the this regiment for 6-9 months. If she has excellent response, then I'll change to Herceptin maintenance therapy. -G1 abdominal cramps and diarrhea, likely secondary to Taxol. She also had mild reaction to Herceptin or pertuzumab, on solu-medro 36m iv as premeds.  -She had normal echo at baseline, we'll repeat her echo  every 3 months when she is on and HER-2 therapy.  2. Skin rashes -The rash on race and upper chest could be related to chemo or antibody therapy, improved with topical steroids -Not sure if her rashes on hand are related to treatment, vs eczema. I encourage her to use topical steroids and clindamycin for now, and inform me if it gets worse.   Plan -use topical steroids and clindamycin gel for skin rash  -cont weekly chemo  -RTC in 2 weeks with lab    I spent about 20 minutes counseling the patient and her family members, total care was about 25 minutes.  FTruitt Merle 08/24/2014

## 2014-08-24 NOTE — Telephone Encounter (Signed)
Gave avs & cal for Jan/Feb.

## 2014-08-31 ENCOUNTER — Ambulatory Visit (HOSPITAL_BASED_OUTPATIENT_CLINIC_OR_DEPARTMENT_OTHER): Payer: BLUE CROSS/BLUE SHIELD

## 2014-08-31 ENCOUNTER — Other Ambulatory Visit (HOSPITAL_BASED_OUTPATIENT_CLINIC_OR_DEPARTMENT_OTHER): Payer: BLUE CROSS/BLUE SHIELD

## 2014-08-31 DIAGNOSIS — C50311 Malignant neoplasm of lower-inner quadrant of right female breast: Secondary | ICD-10-CM

## 2014-08-31 DIAGNOSIS — C50911 Malignant neoplasm of unspecified site of right female breast: Secondary | ICD-10-CM

## 2014-08-31 DIAGNOSIS — Z5111 Encounter for antineoplastic chemotherapy: Secondary | ICD-10-CM

## 2014-08-31 DIAGNOSIS — C7801 Secondary malignant neoplasm of right lung: Secondary | ICD-10-CM

## 2014-08-31 DIAGNOSIS — C7800 Secondary malignant neoplasm of unspecified lung: Principal | ICD-10-CM

## 2014-08-31 LAB — CBC WITH DIFFERENTIAL/PLATELET
BASO%: 0.4 % (ref 0.0–2.0)
BASOS ABS: 0 10*3/uL (ref 0.0–0.1)
EOS ABS: 0.1 10*3/uL (ref 0.0–0.5)
EOS%: 1.6 % (ref 0.0–7.0)
HEMATOCRIT: 34.4 % — AB (ref 34.8–46.6)
HGB: 11.1 g/dL — ABNORMAL LOW (ref 11.6–15.9)
LYMPH%: 32.9 % (ref 14.0–49.7)
MCH: 27.9 pg (ref 25.1–34.0)
MCHC: 32.2 g/dL (ref 31.5–36.0)
MCV: 86.8 fL (ref 79.5–101.0)
MONO#: 0.4 10*3/uL (ref 0.1–0.9)
MONO%: 7.2 % (ref 0.0–14.0)
NEUT#: 2.9 10*3/uL (ref 1.5–6.5)
NEUT%: 57.9 % (ref 38.4–76.8)
Platelets: 312 10*3/uL (ref 145–400)
RBC: 3.96 10*6/uL (ref 3.70–5.45)
RDW: 13.8 % (ref 11.2–14.5)
WBC: 5 10*3/uL (ref 3.9–10.3)
lymph#: 1.7 10*3/uL (ref 0.9–3.3)

## 2014-08-31 LAB — COMPREHENSIVE METABOLIC PANEL (CC13)
ALBUMIN: 3.6 g/dL (ref 3.5–5.0)
ALT: 21 U/L (ref 0–55)
AST: 14 U/L (ref 5–34)
Alkaline Phosphatase: 71 U/L (ref 40–150)
Anion Gap: 8 mEq/L (ref 3–11)
BILIRUBIN TOTAL: 0.3 mg/dL (ref 0.20–1.20)
BUN: 12.9 mg/dL (ref 7.0–26.0)
CALCIUM: 8.7 mg/dL (ref 8.4–10.4)
CO2: 27 mEq/L (ref 22–29)
Chloride: 107 mEq/L (ref 98–109)
Creatinine: 0.8 mg/dL (ref 0.6–1.1)
GLUCOSE: 97 mg/dL (ref 70–140)
POTASSIUM: 3.6 meq/L (ref 3.5–5.1)
SODIUM: 142 meq/L (ref 136–145)
TOTAL PROTEIN: 7.1 g/dL (ref 6.4–8.3)

## 2014-08-31 MED ORDER — ONDANSETRON 16 MG/50ML IVPB (CHCC)
INTRAVENOUS | Status: AC
  Filled 2014-08-31: qty 16

## 2014-08-31 MED ORDER — DEXAMETHASONE SODIUM PHOSPHATE 20 MG/5ML IJ SOLN
INTRAMUSCULAR | Status: AC
  Filled 2014-08-31: qty 5

## 2014-08-31 MED ORDER — ONDANSETRON 8 MG/50ML IVPB (CHCC)
8.0000 mg | Freq: Once | INTRAVENOUS | Status: AC
  Administered 2014-08-31: 8 mg via INTRAVENOUS

## 2014-08-31 MED ORDER — ZOLPIDEM TARTRATE 5 MG PO TABS: 5.0000 mg | ORAL_TABLET | Freq: Every evening | ORAL | Status: DC | PRN

## 2014-08-31 MED ORDER — DIPHENHYDRAMINE HCL 50 MG/ML IJ SOLN
INTRAMUSCULAR | Status: AC
Start: 2014-08-31 — End: 2014-08-31
  Filled 2014-08-31: qty 1

## 2014-08-31 MED ORDER — SODIUM CHLORIDE 0.9 % IV SOLN
Freq: Once | INTRAVENOUS | Status: AC
  Administered 2014-08-31: 15:00:00 via INTRAVENOUS

## 2014-08-31 MED ORDER — PACLITAXEL CHEMO INJECTION 300 MG/50ML
80.0000 mg/m2 | Freq: Once | INTRAVENOUS | Status: AC
  Administered 2014-08-31: 174 mg via INTRAVENOUS
  Filled 2014-08-31: qty 29

## 2014-08-31 MED ORDER — ONDANSETRON 8 MG/NS 50 ML IVPB
INTRAVENOUS | Status: AC
  Filled 2014-08-31: qty 8

## 2014-08-31 MED ORDER — SODIUM CHLORIDE 0.9 % IJ SOLN
10.0000 mL | INTRAMUSCULAR | Status: DC | PRN
  Administered 2014-08-31: 10 mL
  Filled 2014-08-31: qty 10

## 2014-08-31 MED ORDER — DEXAMETHASONE SODIUM PHOSPHATE 20 MG/5ML IJ SOLN
20.0000 mg | Freq: Once | INTRAMUSCULAR | Status: AC
  Administered 2014-08-31: 20 mg via INTRAVENOUS

## 2014-08-31 MED ORDER — DIPHENHYDRAMINE HCL 50 MG/ML IJ SOLN
50.0000 mg | Freq: Once | INTRAMUSCULAR | Status: AC
  Administered 2014-08-31: 50 mg via INTRAVENOUS

## 2014-08-31 MED ORDER — HEPARIN SOD (PORK) LOCK FLUSH 100 UNIT/ML IV SOLN
500.0000 [IU] | Freq: Once | INTRAVENOUS | Status: AC | PRN
  Administered 2014-08-31: 500 [IU]
  Filled 2014-08-31: qty 5

## 2014-08-31 MED ORDER — FAMOTIDINE IN NACL 20-0.9 MG/50ML-% IV SOLN
INTRAVENOUS | Status: AC
  Filled 2014-08-31: qty 50

## 2014-08-31 MED ORDER — FAMOTIDINE IN NACL 20-0.9 MG/50ML-% IV SOLN
20.0000 mg | Freq: Once | INTRAVENOUS | Status: AC
  Administered 2014-08-31: 20 mg via INTRAVENOUS

## 2014-08-31 NOTE — Addendum Note (Signed)
Addended by: [name] on: 08/31/2014 03:03 PM   Modules accepted: Orders

## 2014-08-31 NOTE — Patient Instructions (Signed)
[facility] Discharge Instructions for Patients Receiving Chemotherapy  Today you received the following chemotherapy agents: Taxol.  To help prevent nausea and vomiting after your treatment, we encourage you to take your nausea medication as prescribed.   If you develop nausea and vomiting that is not controlled by your nausea medication, call the clinic.   BELOW ARE SYMPTOMS THAT SHOULD BE REPORTED IMMEDIATELY:  *FEVER GREATER THAN 100.5 F  *CHILLS WITH OR WITHOUT FEVER  NAUSEA AND VOMITING THAT IS NOT CONTROLLED WITH YOUR NAUSEA MEDICATION  *UNUSUAL SHORTNESS OF BREATH  *UNUSUAL BRUISING OR BLEEDING  TENDERNESS IN MOUTH AND THROAT WITH OR WITHOUT PRESENCE OF ULCERS  *URINARY PROBLEMS  *BOWEL PROBLEMS  UNUSUAL RASH Items with * indicate a potential emergency and should be followed up as soon as possible.  Feel free to call the clinic you have any questions or concerns. The clinic phone number is [phone].    

## 2014-08-31 NOTE — Addendum Note (Signed)
Addended by: [name] on: 08/31/2014 05:29 PM   Modules accepted: Orders

## 2014-09-07 ENCOUNTER — Other Ambulatory Visit: Payer: BLUE CROSS/BLUE SHIELD

## 2014-09-07 ENCOUNTER — Encounter: Payer: Self-pay | Admitting: Hematology

## 2014-09-07 ENCOUNTER — Telehealth: Payer: Self-pay | Admitting: Hematology

## 2014-09-07 ENCOUNTER — Ambulatory Visit (HOSPITAL_BASED_OUTPATIENT_CLINIC_OR_DEPARTMENT_OTHER): Payer: BLUE CROSS/BLUE SHIELD

## 2014-09-07 ENCOUNTER — Ambulatory Visit: Payer: BLUE CROSS/BLUE SHIELD

## 2014-09-07 ENCOUNTER — Other Ambulatory Visit (HOSPITAL_BASED_OUTPATIENT_CLINIC_OR_DEPARTMENT_OTHER): Payer: BLUE CROSS/BLUE SHIELD

## 2014-09-07 ENCOUNTER — Ambulatory Visit (HOSPITAL_BASED_OUTPATIENT_CLINIC_OR_DEPARTMENT_OTHER): Payer: BLUE CROSS/BLUE SHIELD | Admitting: Hematology

## 2014-09-07 VITALS — BP 162/83 | HR 83 | Temp 98.9°F | Resp 18 | Ht 70.0 in | Wt 213.2 lb

## 2014-09-07 DIAGNOSIS — Z95828 Presence of other vascular implants and grafts: Secondary | ICD-10-CM

## 2014-09-07 DIAGNOSIS — Z5111 Encounter for antineoplastic chemotherapy: Secondary | ICD-10-CM

## 2014-09-07 DIAGNOSIS — Z171 Estrogen receptor negative status [ER-]: Secondary | ICD-10-CM

## 2014-09-07 DIAGNOSIS — C7801 Secondary malignant neoplasm of right lung: Secondary | ICD-10-CM

## 2014-09-07 DIAGNOSIS — C7802 Secondary malignant neoplasm of left lung: Secondary | ICD-10-CM

## 2014-09-07 DIAGNOSIS — R21 Rash and other nonspecific skin eruption: Secondary | ICD-10-CM

## 2014-09-07 DIAGNOSIS — C7800 Secondary malignant neoplasm of unspecified lung: Principal | ICD-10-CM

## 2014-09-07 DIAGNOSIS — C50911 Malignant neoplasm of unspecified site of right female breast: Secondary | ICD-10-CM

## 2014-09-07 DIAGNOSIS — C50311 Malignant neoplasm of lower-inner quadrant of right female breast: Secondary | ICD-10-CM

## 2014-09-07 LAB — CBC WITH DIFFERENTIAL/PLATELET
BASO%: 0.2 % (ref 0.0–2.0)
BASOS ABS: 0 10*3/uL (ref 0.0–0.1)
EOS%: 2 % (ref 0.0–7.0)
Eosinophils Absolute: 0.1 10*3/uL (ref 0.0–0.5)
HCT: 34.1 % — ABNORMAL LOW (ref 34.8–46.6)
HGB: 11.2 g/dL — ABNORMAL LOW (ref 11.6–15.9)
LYMPH%: 38.4 % (ref 14.0–49.7)
MCH: 28.6 pg (ref 25.1–34.0)
MCHC: 32.8 g/dL (ref 31.5–36.0)
MCV: 87 fL (ref 79.5–101.0)
MONO#: 0.3 10*3/uL (ref 0.1–0.9)
MONO%: 5.9 % (ref 0.0–14.0)
NEUT%: 53.5 % (ref 38.4–76.8)
NEUTROS ABS: 2.7 10*3/uL (ref 1.5–6.5)
PLATELETS: 283 10*3/uL (ref 145–400)
RBC: 3.92 10*6/uL (ref 3.70–5.45)
RDW: 14.1 % (ref 11.2–14.5)
WBC: 5.1 10*3/uL (ref 3.9–10.3)
lymph#: 2 10*3/uL (ref 0.9–3.3)

## 2014-09-07 LAB — COMPREHENSIVE METABOLIC PANEL (CC13)
ALBUMIN: 3.6 g/dL (ref 3.5–5.0)
ALK PHOS: 67 U/L (ref 40–150)
ALT: 24 U/L (ref 0–55)
AST: 16 U/L (ref 5–34)
Anion Gap: 11 mEq/L (ref 3–11)
BILIRUBIN TOTAL: 0.48 mg/dL (ref 0.20–1.20)
BUN: 10.6 mg/dL (ref 7.0–26.0)
CALCIUM: 8.7 mg/dL (ref 8.4–10.4)
CO2: 24 mEq/L (ref 22–29)
CREATININE: 0.7 mg/dL (ref 0.6–1.1)
Chloride: 106 mEq/L (ref 98–109)
EGFR: >90 mL/min/{1.73_m2} (ref >90)
Glucose: 104 mg/dl (ref 70–140)
Potassium: 3.5 mEq/L (ref 3.5–5.1)
SODIUM: 142 meq/L (ref 136–145)
Total Protein: 7.1 g/dL (ref 6.4–8.3)

## 2014-09-07 MED ORDER — DIPHENHYDRAMINE HCL 50 MG/ML IJ SOLN
50.0000 mg | Freq: Once | INTRAMUSCULAR | Status: AC
  Administered 2014-09-07: 50 mg via INTRAVENOUS

## 2014-09-07 MED ORDER — FAMOTIDINE IN NACL 20-0.9 MG/50ML-% IV SOLN
INTRAVENOUS | Status: AC
  Filled 2014-09-07: qty 50

## 2014-09-07 MED ORDER — SODIUM CHLORIDE 0.9 % IJ SOLN
10.0000 mL | INTRAMUSCULAR | Status: DC | PRN
  Administered 2014-09-07: 10 mL
  Filled 2014-09-07: qty 10

## 2014-09-07 MED ORDER — DIPHENHYDRAMINE HCL 50 MG/ML IJ SOLN
INTRAMUSCULAR | Status: AC
  Filled 2014-09-07: qty 1

## 2014-09-07 MED ORDER — DEXAMETHASONE SODIUM PHOSPHATE 10 MG/ML IJ SOLN
INTRAMUSCULAR | Status: AC
  Filled 2014-09-07: qty 1

## 2014-09-07 MED ORDER — PACLITAXEL CHEMO INJECTION 300 MG/50ML
80.0000 mg/m2 | Freq: Once | INTRAVENOUS | Status: AC
  Administered 2014-09-07: 174 mg via INTRAVENOUS
  Filled 2014-09-07: qty 29

## 2014-09-07 MED ORDER — HEPARIN SOD (PORK) LOCK FLUSH 100 UNIT/ML IV SOLN
500.0000 [IU] | Freq: Once | INTRAVENOUS | Status: AC | PRN
  Administered 2014-09-07: 500 [IU]
  Filled 2014-09-07: qty 5

## 2014-09-07 MED ORDER — CLINDAMYCIN PHOSPHATE 1 % EX GEL: CUTANEOUS | Status: DC

## 2014-09-07 MED ORDER — DEXAMETHASONE SODIUM PHOSPHATE 10 MG/ML IJ SOLN
10.0000 mg | Freq: Once | INTRAMUSCULAR | Status: AC
  Administered 2014-09-07: 10 mg via INTRAVENOUS

## 2014-09-07 MED ORDER — ONDANSETRON 8 MG/NS 50 ML IVPB
INTRAVENOUS | Status: AC
  Filled 2014-09-07: qty 8

## 2014-09-07 MED ORDER — ONDANSETRON 8 MG/50ML IVPB (CHCC)
8.0000 mg | Freq: Once | INTRAVENOUS | Status: AC
  Administered 2014-09-07: 8 mg via INTRAVENOUS

## 2014-09-07 MED ORDER — SODIUM CHLORIDE 0.9 % IJ SOLN
10.0000 mL | INTRAMUSCULAR | Status: DC | PRN
  Administered 2014-09-07: 10 mL via INTRAVENOUS
  Filled 2014-09-07: qty 10

## 2014-09-07 MED ORDER — FAMOTIDINE IN NACL 20-0.9 MG/50ML-% IV SOLN
20.0000 mg | Freq: Once | INTRAVENOUS | Status: AC
  Administered 2014-09-07: 20 mg via INTRAVENOUS

## 2014-09-07 MED ORDER — SODIUM CHLORIDE 0.9 % IV SOLN
Freq: Once | INTRAVENOUS | Status: AC
  Administered 2014-09-07: 15:00:00 via INTRAVENOUS

## 2014-09-07 NOTE — Patient Instructions (Signed)

## 2014-09-07 NOTE — Progress Notes (Signed)
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 MRI breast on 07/16/2014 showed 7cm R breast lesion and 4.4cm r axillary node    07/02/2014 Imaging CT CAP: a 4.7cm mass in LUL lung and a 2.1cm mas in RML, and a small nodule in RUL, suspecious for metastasis     07/09/2014 Initial Diagnosis right IDA with b/l lung lesions, ER-/PR-/HER2+   07/09/2014 Initial Biopsy US guided right breast mass and axillary node biopsy showed IDA, and DCIS, ER-/PR-/HER2+   CURRENT THERAPY: weekly Paclitaxel 62m/m2, trastuzumab and pertuzumab every 3 weeks, started on 08/04/2014  INTERVAL HISTROY   NKylynnreturns for follow-up and 6th dose of chemo. She has been tolerated well overall, her abdominal cramps and diarrhea is getting less, she only took one Imodium after last cycle of chemotherapy. She does notice her skin rash especially on face and scalp got worse after second dose of trastuzumab and to pertuzumab, mild to moderately itchy. No fever or chills, no other new complaints.  MEDICAL HISTORY:  Past Medical History  Diagnosis Date  . Breast cancer   . Pneumonia     hx of pneumonia 08/2013   . GERD (gastroesophageal reflux disease)     during pregnancy   . Breast cancer     SURGICAL HISTORY: Past Surgical History  Procedure Laterality  Date  . Cesarean section    . Essure tubal ligation    . Cholecystectomy    . Wisdom tooth extraction    . Portacath placement Left 07/21/2014    Procedure: INSERTION PORT-A-CATH;  Surgeon: FStark Klein MD;  Location: [facility];  Service: General;  Laterality: Left;    SOCIAL HISTORY: History   Social History  . Marital Status: Married    Spouse Name: N/A    Number of Children:  she has 2 daughters at the age of 89and 140   . Years of Education: N/A   Occupational History  .  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 Smoking status: Never Smoker   . Smokeless tobacco: Not on file  . Alcohol Use: Yes  . Drug Use: No  . Sexual Activity: No    FAMILY HISTORY: Family History  Problem Relation Age of Onset  . Breast cancer Mother 534 . Liver cancer Father 684 . Breast cancer Maternal Aunt 36  . Prostate cancer Maternal Grandfather   . Liver cancer Paternal Grandmother   . Prostate cancer Paternal Grandfather      GENETICS: 08/30/2014 BreastNext panel was negative. 17 genes including BRCA1, BRCA2, were negative for mutations.   ALLERGIES:  is allergic to aspirin and codeine.  MEDICATIONS:  Current Outpatient Prescriptions  Medication Sig Dispense Refill  . albuterol (PROVENTIL HFA;VENTOLIN HFA) 108 (90 BASE) MCG/ACT inhaler Inhale 2 puffs into the lungs every 4 (four) hours  as needed for wheezing or shortness of breath.    . benzonatate (TESSALON) 100 MG capsule Take 1 capsule (100 mg total) by mouth 3 (three) times daily as needed for cough. 21 capsule 0  . Camphor-Eucalyptus-Menthol (VICKS VAPORUB EX) Apply 1 application topically at bedtime as needed (sinus). Applies under nose, throat and on chest.    . clindamycin (CLINDAGEL) 1 % gel Apply topically to affected areas BID. 30 g 3  . HYDROCORTISONE, TOPICAL, 2 % LOTN Apply topically to affected areas BID. 29.6 mL 0  . ibuprofen (ADVIL,MOTRIN) 200 MG tablet Take 400-800 mg by mouth every  6 (six) hours as needed for headache or moderate pain.    Marland Kitchen lidocaine-prilocaine (EMLA) cream Apply 1 application topically as needed. Apply to portacath 1 1/2  -  2  Hours prior to procedures as needed. 30 g 1  . loperamide (IMODIUM A-D) 2 MG tablet Take 2 mg by mouth 4 (four) times daily as needed for diarrhea or loose stools.    . metaxalone (SKELAXIN) 800 MG tablet Take 800 mg by mouth 3 (three) times daily as needed for muscle spasms.    . Neomycin-Bacitracin-Polymyxin (TRIPLE ANTIBIOTIC EX) Apply 1 application topically 2 (two) times daily as needed (eczema on hands.).    Marland Kitchen ondansetron (ZOFRAN) 8 MG tablet Take 1 tablet (8 mg total) by mouth every 8 (eight) hours as needed for nausea or vomiting. 30 tablet 3  . oxyCODONE-acetaminophen (ROXICET) 5-325 MG per tablet Take 1-2 tablets by mouth every 4 (four) hours as needed for severe pain. 30 tablet 0  . Pseudoeph-Doxylamine-DM-APAP (NYQUIL PO) Take 30 mLs by mouth 2 (two) times daily as needed (cold/allergies).     . cetirizine (ZYRTEC) 10 MG tablet Take 10 mg by mouth daily as needed for allergies.     Marland Kitchen zolpidem (AMBIEN) 5 MG tablet Take 1 tablet (5 mg total) by mouth at bedtime as needed for sleep. (Patient not taking: Reported on 09/07/2014) 20 tablet 0   No current facility-administered medications for this visit.    REVIEW OF SYSTEMS:   Constitutional: Denies fevers, chills or abnormal night sweats, (+) malaise  Eyes: Denies blurriness of vision, double vision or watery eyes Ears, nose, mouth, throat, and face: Denies mucositis or sore throat Respiratory: (+) dry cough, no dyspnea or wheezes Cardiovascular: Denies palpitation, chest discomfort or lower extremity swelling Gastrointestinal:  Denies nausea, heartburn or change in bowel habits Skin:(+) rashes  Lymphatics: Denies new lymphadenopathy or easy bruising Neurological:Denies numbness, tingling or new weaknesses Behavioral/Psych: Mood is stable, no new changes  All other  systems were reviewed with the patient and are negative.  PHYSICAL EXAMINATION: ECOG PERFORMANCE STATUS: 0 - Asymptomatic  Filed Vitals:   09/07/14 1406  BP: 162/83  Pulse: 83  Temp: 98.9 F (37.2 C)  Resp: 18   Filed Weights   09/07/14 1406  Weight: 213 lb 3.2 oz (96.707 kg)    GENERAL:alert, no distress and comfortable SKIN: skin color, texture, turgor are normal, no rashes or significant lesions EYES: normal, conjunctiva are pink and non-injected, sclera clear OROPHARYNX:no exudate, no erythema and lips, buccal mucosa, and tongue normal  NECK: supple, thyroid normal size, non-tender, without nodularity LYMPH:  no palpable lymphadenopathy in the cervical, axillary or inguinal LUNGS: clear to auscultation and percussion with normal breathing effort HEART: regular rate & rhythm and no murmurs and no lower extremity edema ABDOMEN:abdomen soft, non-tender and normal bowel sounds Musculoskeletal:no cyanosis of digits and no clubbing  PSYCH: alert &  oriented x 3 with fluent speech NEURO: no focal motor/sensory deficits Breasts: Breast inspection showed them to be symmetrical with no skin change or nipple discharge. Palpation of the right breast showed a 3X4.5cm mass in the lower inner quadrant, which is softer than before, and the previously palpable right axillary node is not palpable anymore except some fullness. Left breast  and axilla revealed no obvious mass that I could appreciate. Skin: multiple acne like macular skin rashes on face and scalp (she lost most of her hair), a few on upper chest. Rashes on hands have resolved.    LABORATORY DATA:  I have reviewed the data as listed Lab Results  Component Value Date   WBC 5.1 09/07/2014   HGB 11.2* 09/07/2014   HCT 34.1* 09/07/2014   MCV 87.0 09/07/2014   PLT 283 09/07/2014    Recent Labs  08/24/14 0850 08/31/14 1406 09/07/14 1340  NA 140 142 142  K 4.1 3.6 3.5  CO2 29 27 24   GLUCOSE 87 97 104  BUN 8.1 12.9 10.6   CREATININE 0.8 0.8 0.7  CALCIUM 8.9 8.7 8.7  PROT 6.9 7.1 7.1  ALBUMIN 3.6 3.6 3.6  AST 15 14 16   ALT 22 21 24   ALKPHOS 66 71 67  BILITOT 0.53 0.30 0.48   PATHOLOGY REPORT 07/09/2014 #1 breast, right needle core biopsy more anterior -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 45)  Lung, biopsy, Left 07/26/2014 - HIGH GRADE CARCINOMA, SEE COMMENT. Microscopic Comment The carcinoma demonstrates the following immunophenotype: TTF-1 - negative expression. Napsin A- negative expression. CK5/6 - focal, moderate expression. estrogen receptor - negative expression. GCDFP- negative expression. The recent breast biopsy demonstrating Her2 amplified high grade invasive mammary carcinoma is noted ([id number]). Although the immunophenotype of the current case is non-sepcific, it strongly argues against primary lung adenocarcinoma. However, on re-review, the morphology of the current case is essentially identical to the primary mammary carcinoma. In lieu of further immunophenotyping, the tumor will be submitted for Her2 testing and the remaining tissue will be reserved for additional ancillary tumor testing. The results of the Her2 testing will be reported in an addendum. The case was discussed with Dr Burr Medico on 07/28/2015 and 07/29/2015 HER2 POSITIVE    RADIOGRAPHIC STUDIES: CT CAP 07/20/2014 IMPRESSION: 1. Right breast mass is compatible with known malignancy. Adjacent soft tissue masses may represent satellite lesions. Recommend correlation with mammography, ultrasound or MRI. 2. Left upper lobe, right middle lobe and bilateral lower lobe pulmonary masses/nodules are concerning for metastatic disease. 3. Right axillary and left hilar adenopathy are concerning  for metastatic disease. 4. 3 mm nonobstructing right renal stone.   ECHO 12/8 Impressions: - Normal biventricular size and systolic function. Impaired relaxation with normal filling pressures. Mild tricupsid regurgitation with normal RVSP.  ASSESSMENT & PLAN:  44 year old African-American female, premenopausal, without significant past medical history, presented with palpable right breast mass and right axillary mass.   1. 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 -continue weekly paclitaxel, trastuzumab and pertuzumab every 3 weeks. I'll evaluate her response to the treatment in 2-3 months by a restaging CT scan, I will likely to continue  the this regiment for 6-9 months. If she has excellent response, then I'll change to Herceptin maintenance therapy. -G1 abdominal cramps and diarrhea, likely secondary to Taxol. She also had mild reaction to Herceptin or pertuzumab, on solu-medro 3m iv as premeds.  -She had normal echo at baseline, we'll repeat her echo  every 3 months when she is on and HER-2 therapy. -We'll decrease her dexamethasone from 20 to10 mg before each dose of Taxol. She has been tolerating it well.  2. Skin rashes on face and scalp, likely related to antibody therapy  -The rash on upper chest much improved with topical steroids - I told her to use topical steroids once on face and clindamycin gel twice on face.   Plan -use topical steroids and clindamycin gel for skin rash  -cont weekly chemo  -RTC in 4 weeks with lab    I spent about 20 minutes counseling the patient and her family members, total care was about 25 minutes.  FTruitt Merle 09/07/2014

## 2014-09-07 NOTE — Patient Instructions (Signed)
[facility] Discharge Instructions for Patients Receiving Chemotherapy  Today you received the following chemotherapy agent: Taxol   To help prevent nausea and vomiting after your treatment, we encourage you to take your nausea medication as prescribed.    If you develop nausea and vomiting that is not controlled by your nausea medication, call the clinic.   BELOW ARE SYMPTOMS THAT SHOULD BE REPORTED IMMEDIATELY:  *FEVER GREATER THAN 100.5 F  *CHILLS WITH OR WITHOUT FEVER  NAUSEA AND VOMITING THAT IS NOT CONTROLLED WITH YOUR NAUSEA MEDICATION  *UNUSUAL SHORTNESS OF BREATH  *UNUSUAL BRUISING OR BLEEDING  TENDERNESS IN MOUTH AND THROAT WITH OR WITHOUT PRESENCE OF ULCERS  *URINARY PROBLEMS  *BOWEL PROBLEMS  UNUSUAL RASH Items with * indicate a potential emergency and should be followed up as soon as possible.  Feel free to call the clinic you have any questions or concerns. The clinic phone number is [phone].    

## 2014-09-07 NOTE — Telephone Encounter (Signed)
Gave avs & calendar for February. Sent message to schedule treatment. °

## 2014-09-08 ENCOUNTER — Telehealth: Payer: Self-pay | Admitting: *Deleted

## 2014-09-08 NOTE — Telephone Encounter (Signed)
Per staff message and POF I have scheduled appts. Advised scheduler of appts. JMW  

## 2014-09-09 ENCOUNTER — Other Ambulatory Visit: Payer: Self-pay | Admitting: *Deleted

## 2014-09-10 ENCOUNTER — Other Ambulatory Visit: Payer: Self-pay | Admitting: *Deleted

## 2014-09-10 DIAGNOSIS — R21 Rash and other nonspecific skin eruption: Secondary | ICD-10-CM

## 2014-09-10 MED ORDER — HYDROCORTISONE 2 % EX LOTN
TOPICAL_LOTION | CUTANEOUS | Status: DC
Start: 2014-09-10 — End: 2015-03-24

## 2014-09-13 ENCOUNTER — Encounter: Payer: Self-pay | Admitting: Hematology

## 2014-09-13 ENCOUNTER — Encounter: Payer: Self-pay | Admitting: *Deleted

## 2014-09-13 NOTE — Progress Notes (Signed)
[employer], [id number], approved ala-scalp lotion from 08/14/14-09/13/15 auth # [id number]

## 2014-09-13 NOTE — Progress Notes (Signed)
RECEIVED A FAX FROM [facility] CONCERNING A PRIOR AUTHORIZATION FOR ALA-SCALP LOTION. THIS REQUEST WAS PLACED IN THE MANAGED CARE BIN.

## 2014-09-14 ENCOUNTER — Ambulatory Visit (HOSPITAL_BASED_OUTPATIENT_CLINIC_OR_DEPARTMENT_OTHER): Payer: BLUE CROSS/BLUE SHIELD

## 2014-09-14 ENCOUNTER — Ambulatory Visit: Payer: BLUE CROSS/BLUE SHIELD | Admitting: Nutrition

## 2014-09-14 ENCOUNTER — Other Ambulatory Visit (HOSPITAL_BASED_OUTPATIENT_CLINIC_OR_DEPARTMENT_OTHER): Payer: BLUE CROSS/BLUE SHIELD

## 2014-09-14 ENCOUNTER — Ambulatory Visit: Payer: BLUE CROSS/BLUE SHIELD

## 2014-09-14 ENCOUNTER — Other Ambulatory Visit: Payer: BLUE CROSS/BLUE SHIELD

## 2014-09-14 DIAGNOSIS — C50911 Malignant neoplasm of unspecified site of right female breast: Secondary | ICD-10-CM

## 2014-09-14 DIAGNOSIS — C50311 Malignant neoplasm of lower-inner quadrant of right female breast: Secondary | ICD-10-CM

## 2014-09-14 DIAGNOSIS — C7800 Secondary malignant neoplasm of unspecified lung: Principal | ICD-10-CM

## 2014-09-14 DIAGNOSIS — C7801 Secondary malignant neoplasm of right lung: Secondary | ICD-10-CM

## 2014-09-14 DIAGNOSIS — Z95828 Presence of other vascular implants and grafts: Secondary | ICD-10-CM

## 2014-09-14 DIAGNOSIS — Z5111 Encounter for antineoplastic chemotherapy: Secondary | ICD-10-CM

## 2014-09-14 DIAGNOSIS — Z5112 Encounter for antineoplastic immunotherapy: Secondary | ICD-10-CM

## 2014-09-14 LAB — COMPREHENSIVE METABOLIC PANEL (CC13)
ALBUMIN: 3.7 g/dL (ref 3.5–5.0)
ALT: 28 U/L (ref 0–55)
ANION GAP: 7 meq/L (ref 3–11)
AST: 21 U/L (ref 5–34)
Alkaline Phosphatase: 70 U/L (ref 40–150)
BUN: 11.3 mg/dL (ref 7.0–26.0)
CHLORIDE: 108 meq/L (ref 98–109)
CO2: 25 mEq/L (ref 22–29)
Calcium: 8.7 mg/dL (ref 8.4–10.4)
Creatinine: 0.7 mg/dL (ref 0.6–1.1)
EGFR: >90 mL/min/{1.73_m2} (ref >90)
Glucose: 97 mg/dl (ref 70–140)
POTASSIUM: 3.8 meq/L (ref 3.5–5.1)
SODIUM: 141 meq/L (ref 136–145)
Total Bilirubin: 0.41 mg/dL (ref 0.20–1.20)
Total Protein: 7.2 g/dL (ref 6.4–8.3)

## 2014-09-14 LAB — CBC WITH DIFFERENTIAL/PLATELET
BASO%: 0.4 % (ref 0.0–2.0)
Basophils Absolute: 0 10*3/uL (ref 0.0–0.1)
EOS%: 1.6 % (ref 0.0–7.0)
Eosinophils Absolute: 0.1 10*3/uL (ref 0.0–0.5)
HCT: 34.4 % — ABNORMAL LOW (ref 34.8–46.6)
HEMOGLOBIN: 11.3 g/dL — AB (ref 11.6–15.9)
LYMPH%: 36.4 % (ref 14.0–49.7)
MCH: 28.8 pg (ref 25.1–34.0)
MCHC: 32.8 g/dL (ref 31.5–36.0)
MCV: 87.5 fL (ref 79.5–101.0)
MONO#: 0.4 10*3/uL (ref 0.1–0.9)
MONO%: 6.4 % (ref 0.0–14.0)
NEUT%: 55.2 % (ref 38.4–76.8)
NEUTROS ABS: 3 10*3/uL (ref 1.5–6.5)
Platelets: 292 10*3/uL (ref 145–400)
RBC: 3.93 10*6/uL (ref 3.70–5.45)
RDW: 14.4 % (ref 11.2–14.5)
WBC: 5.5 10*3/uL (ref 3.9–10.3)
lymph#: 2 10*3/uL (ref 0.9–3.3)

## 2014-09-14 MED ORDER — ACETAMINOPHEN 325 MG PO TABS
ORAL_TABLET | ORAL | Status: AC
  Filled 2014-09-14: qty 2

## 2014-09-14 MED ORDER — SODIUM CHLORIDE 0.9 % IJ SOLN
10.0000 mL | INTRAMUSCULAR | Status: DC | PRN
  Administered 2014-09-14: 10 mL
  Filled 2014-09-14: qty 10

## 2014-09-14 MED ORDER — DIPHENHYDRAMINE HCL 25 MG PO CAPS: 50.0000 mg | ORAL_CAPSULE | Freq: Once | ORAL | Status: DC

## 2014-09-14 MED ORDER — ACETAMINOPHEN 325 MG PO TABS
650.0000 mg | ORAL_TABLET | Freq: Once | ORAL | Status: AC
  Administered 2014-09-14: 650 mg via ORAL

## 2014-09-14 MED ORDER — HEPARIN SOD (PORK) LOCK FLUSH 100 UNIT/ML IV SOLN
500.0000 [IU] | Freq: Once | INTRAVENOUS | Status: AC | PRN
  Administered 2014-09-14: 500 [IU]
  Filled 2014-09-14: qty 5

## 2014-09-14 MED ORDER — DIPHENHYDRAMINE HCL 50 MG/ML IJ SOLN
50.0000 mg | Freq: Once | INTRAMUSCULAR | Status: AC
  Administered 2014-09-14: 50 mg via INTRAVENOUS

## 2014-09-14 MED ORDER — SODIUM CHLORIDE 0.9 % IV SOLN
Freq: Once | INTRAVENOUS | Status: AC
  Administered 2014-09-14: 11:00:00 via INTRAVENOUS

## 2014-09-14 MED ORDER — ONDANSETRON 8 MG/50ML IVPB (CHCC)
8.0000 mg | Freq: Once | INTRAVENOUS | Status: AC
  Administered 2014-09-14: 8 mg via INTRAVENOUS

## 2014-09-14 MED ORDER — METHYLPREDNISOLONE SODIUM SUCC 40 MG IJ SOLR
40.0000 mg | Freq: Once | INTRAMUSCULAR | Status: AC
  Administered 2014-09-14: 40 mg via INTRAVENOUS

## 2014-09-14 MED ORDER — METHYLPREDNISOLONE SODIUM SUCC 40 MG IJ SOLR
INTRAMUSCULAR | Status: AC
  Filled 2014-09-14: qty 1

## 2014-09-14 MED ORDER — FAMOTIDINE IN NACL 20-0.9 MG/50ML-% IV SOLN
INTRAVENOUS | Status: AC
  Filled 2014-09-14: qty 50

## 2014-09-14 MED ORDER — DEXTROSE 5 % IV SOLN
80.0000 mg/m2 | Freq: Once | INTRAVENOUS | Status: AC
  Administered 2014-09-14: 174 mg via INTRAVENOUS
  Filled 2014-09-14: qty 29

## 2014-09-14 MED ORDER — FAMOTIDINE IN NACL 20-0.9 MG/50ML-% IV SOLN
20.0000 mg | Freq: Once | INTRAVENOUS | Status: AC
  Administered 2014-09-14: 20 mg via INTRAVENOUS

## 2014-09-14 MED ORDER — SODIUM CHLORIDE 0.9 % IJ SOLN
10.0000 mL | INTRAMUSCULAR | Status: DC | PRN
  Administered 2014-09-14: 10 mL via INTRAVENOUS
  Filled 2014-09-14: qty 10

## 2014-09-14 MED ORDER — TRASTUZUMAB CHEMO INJECTION 440 MG
6.0000 mg/kg | Freq: Once | INTRAVENOUS | Status: AC
  Administered 2014-09-14: 588 mg via INTRAVENOUS
  Filled 2014-09-14: qty 28

## 2014-09-14 MED ORDER — DEXAMETHASONE SODIUM PHOSPHATE 10 MG/ML IJ SOLN
10.0000 mg | Freq: Once | INTRAMUSCULAR | Status: AC
  Administered 2014-09-14: 10 mg via INTRAVENOUS

## 2014-09-14 MED ORDER — DIPHENHYDRAMINE HCL 50 MG/ML IJ SOLN
INTRAMUSCULAR | Status: AC
  Filled 2014-09-14: qty 1

## 2014-09-14 MED ORDER — ONDANSETRON 8 MG/NS 50 ML IVPB
INTRAVENOUS | Status: AC
  Filled 2014-09-14: qty 8

## 2014-09-14 MED ORDER — DEXAMETHASONE SODIUM PHOSPHATE 10 MG/ML IJ SOLN
INTRAMUSCULAR | Status: AC
  Filled 2014-09-14: qty 1

## 2014-09-14 MED ORDER — PERTUZUMAB CHEMO INJECTION 420 MG/14ML
420.0000 mg | Freq: Once | INTRAVENOUS | Status: AC
  Administered 2014-09-14: 420 mg via INTRAVENOUS
  Filled 2014-09-14: qty 14

## 2014-09-14 NOTE — Patient Instructions (Signed)

## 2014-09-14 NOTE — Progress Notes (Signed)
44 year old female diagnosed with ER negative PR negative breast cancer.  She is a patient of Dr. Burr Medico.  Past medical history includes pneumonia and GERD.  Medications include Imodium and Zofran.  Labs were reviewed.  Height: 5 feet 10 inches. Weight: 213.2 pounds January 26. Usual body weight: 220 pounds December 2015. BMI: 30.59.  Patient reports nausea and diarrhea. Patient has been trying different foods for improved tolerance. Patient reports she takes medications when needed.  Nutrition diagnosis: Unintended weight loss related to breast cancer and associated treatments as evidenced by 7 pound weight loss.  Intervention: Patient educated on strategies for improving nausea with food choices.  Nutrition fact sheet provided. Educated patient on strategies for eating with diarrhea.  Recommended bland, low fiber foods.  Nutrition fact sheet provided. Encouraged patient to increase protein containing foods and consume small frequent meals and snacks. Provided suggestions for snacks.  Fact sheet provided on increasing calories and protein. Questions were answered and teach back method used.  Monitoring, evaluation, goals: Patient will work to consume adequate calories and protein to minimize weight loss.  Next visit: Patient will contact me if she has any questions.  **Disclaimer: This note was dictated with voice recognition software. Similar sounding words can inadvertently be transcribed and this note may contain transcription errors which may not have been corrected upon publication of note.**

## 2014-09-14 NOTE — Patient Instructions (Signed)
[facility] Discharge Instructions for Patients Receiving Chemotherapy  Today you received the following chemotherapy agents Herceptin, Perjeta and Taxol.  To help prevent nausea and vomiting after your treatment, we encourage you to take your nausea medication as prescribed.   If you develop nausea and vomiting that is not controlled by your nausea medication, call the clinic.   BELOW ARE SYMPTOMS THAT SHOULD BE REPORTED IMMEDIATELY:  *FEVER GREATER THAN 100.5 F  *CHILLS WITH OR WITHOUT FEVER  NAUSEA AND VOMITING THAT IS NOT CONTROLLED WITH YOUR NAUSEA MEDICATION  *UNUSUAL SHORTNESS OF BREATH  *UNUSUAL BRUISING OR BLEEDING  TENDERNESS IN MOUTH AND THROAT WITH OR WITHOUT PRESENCE OF ULCERS  *URINARY PROBLEMS  *BOWEL PROBLEMS  UNUSUAL RASH Items with * indicate a potential emergency and should be followed up as soon as possible.  Feel free to call the clinic you have any questions or concerns. The clinic phone number is [phone].

## 2014-09-20 ENCOUNTER — Telehealth: Payer: Self-pay | Admitting: *Deleted

## 2014-09-20 ENCOUNTER — Other Ambulatory Visit: Payer: Self-pay | Admitting: *Deleted

## 2014-09-20 ENCOUNTER — Encounter: Payer: Self-pay | Admitting: *Deleted

## 2014-09-20 ENCOUNTER — Ambulatory Visit (HOSPITAL_COMMUNITY)
Admission: RE | Admit: 2014-09-20 | Discharge: 2014-09-20 | Disposition: A | Discharge status: Home / Self Care | Payer: BLUE CROSS/BLUE SHIELD | Source: Ambulatory Visit | Attending: Hematology | Admitting: Hematology

## 2014-09-20 DIAGNOSIS — C7801 Secondary malignant neoplasm of right lung: Secondary | ICD-10-CM | POA: Insufficient documentation

## 2014-09-20 DIAGNOSIS — C7800 Secondary malignant neoplasm of unspecified lung: Secondary | ICD-10-CM

## 2014-09-20 DIAGNOSIS — C50911 Malignant neoplasm of unspecified site of right female breast: Secondary | ICD-10-CM

## 2014-09-20 DIAGNOSIS — M79605 Pain in left leg: Secondary | ICD-10-CM | POA: Diagnosis not present

## 2014-09-20 DIAGNOSIS — M79609 Pain in unspecified limb: Secondary | ICD-10-CM

## 2014-09-20 NOTE — Telephone Encounter (Signed)
Received call from [facility] reporting that pt had LLE venous duplex & result was negative for DVT.  Message to Dr Burr Medico.

## 2014-09-20 NOTE — Progress Notes (Signed)
*  Preliminary Results* Left lower extremity venous duplex completed. Left lower extremity is negative for deep vein thrombosis. There is no evidence of left Baker's cyst.  Attempted to call preliminary results in to Dr. Ernestina Penna office, however there was no answer so a voicemail was left. The patient will be discharged and can be reached by phone if necessary.   09/20/2014 1:59 PM  Maudry Mayhew, RVT, RDCS, RDMS

## 2014-09-20 NOTE — Progress Notes (Signed)
Spoke to patient today.  She states she has been having pain in her left leg.  She know has pain radiating up to right above her knee cap.  She is limping.  Per Dr. Burr Medico we will get a doppler.  Appt scheduled for today.  Walked patient over.  Gave support and encouraged her to call with any needs or concerns.

## 2014-09-21 ENCOUNTER — Other Ambulatory Visit (HOSPITAL_BASED_OUTPATIENT_CLINIC_OR_DEPARTMENT_OTHER): Payer: BLUE CROSS/BLUE SHIELD

## 2014-09-21 ENCOUNTER — Other Ambulatory Visit: Payer: BLUE CROSS/BLUE SHIELD

## 2014-09-21 ENCOUNTER — Ambulatory Visit: Payer: BLUE CROSS/BLUE SHIELD

## 2014-09-21 ENCOUNTER — Telehealth: Payer: Self-pay | Admitting: Hematology

## 2014-09-21 ENCOUNTER — Ambulatory Visit (HOSPITAL_BASED_OUTPATIENT_CLINIC_OR_DEPARTMENT_OTHER): Payer: BLUE CROSS/BLUE SHIELD | Admitting: Hematology

## 2014-09-21 ENCOUNTER — Telehealth: Payer: Self-pay | Admitting: *Deleted

## 2014-09-21 ENCOUNTER — Ambulatory Visit (HOSPITAL_BASED_OUTPATIENT_CLINIC_OR_DEPARTMENT_OTHER): Payer: BLUE CROSS/BLUE SHIELD

## 2014-09-21 VITALS — BP 151/86 | HR 81 | Temp 98.8°F | Wt 209.2 lb

## 2014-09-21 VITALS — Ht 70.0 in

## 2014-09-21 DIAGNOSIS — Z95828 Presence of other vascular implants and grafts: Secondary | ICD-10-CM

## 2014-09-21 DIAGNOSIS — C773 Secondary and unspecified malignant neoplasm of axilla and upper limb lymph nodes: Secondary | ICD-10-CM

## 2014-09-21 DIAGNOSIS — C50911 Malignant neoplasm of unspecified site of right female breast: Secondary | ICD-10-CM

## 2014-09-21 DIAGNOSIS — C7800 Secondary malignant neoplasm of unspecified lung: Principal | ICD-10-CM

## 2014-09-21 DIAGNOSIS — Z5111 Encounter for antineoplastic chemotherapy: Secondary | ICD-10-CM

## 2014-09-21 DIAGNOSIS — C50311 Malignant neoplasm of lower-inner quadrant of right female breast: Secondary | ICD-10-CM

## 2014-09-21 DIAGNOSIS — R21 Rash and other nonspecific skin eruption: Secondary | ICD-10-CM

## 2014-09-21 DIAGNOSIS — C7802 Secondary malignant neoplasm of left lung: Secondary | ICD-10-CM

## 2014-09-21 DIAGNOSIS — C7801 Secondary malignant neoplasm of right lung: Secondary | ICD-10-CM

## 2014-09-21 LAB — CBC WITH DIFFERENTIAL/PLATELET
BASO%: 0.5 % (ref 0.0–2.0)
Basophils Absolute: 0 10*3/uL (ref 0.0–0.1)
EOS ABS: 0.1 10*3/uL (ref 0.0–0.5)
EOS%: 2 % (ref 0.0–7.0)
HCT: 33.1 % — ABNORMAL LOW (ref 34.8–46.6)
HGB: 11.2 g/dL — ABNORMAL LOW (ref 11.6–15.9)
LYMPH%: 35.9 % (ref 14.0–49.7)
MCH: 29.5 pg (ref 25.1–34.0)
MCHC: 33.8 g/dL (ref 31.5–36.0)
MCV: 87.1 fL (ref 79.5–101.0)
MONO#: 0.4 10*3/uL (ref 0.1–0.9)
MONO%: 6.1 % (ref 0.0–14.0)
NEUT#: 3.3 10*3/uL (ref 1.5–6.5)
NEUT%: 55.5 % (ref 38.4–76.8)
Platelets: 303 10*3/uL (ref 145–400)
RBC: 3.8 10*6/uL (ref 3.70–5.45)
RDW: 14.7 % — ABNORMAL HIGH (ref 11.2–14.5)
WBC: 6 10*3/uL (ref 3.9–10.3)
lymph#: 2.2 10*3/uL (ref 0.9–3.3)

## 2014-09-21 LAB — COMPREHENSIVE METABOLIC PANEL (CC13)
ALK PHOS: 72 U/L (ref 40–150)
ALT: 31 U/L (ref 0–55)
AST: 20 U/L (ref 5–34)
Albumin: 3.7 g/dL (ref 3.5–5.0)
Anion Gap: 9 mEq/L (ref 3–11)
BUN: 10.8 mg/dL (ref 7.0–26.0)
CALCIUM: 9.2 mg/dL (ref 8.4–10.4)
CO2: 24 mEq/L (ref 22–29)
CREATININE: 0.8 mg/dL (ref 0.6–1.1)
Chloride: 106 mEq/L (ref 98–109)
Glucose: 81 mg/dl (ref 70–140)
POTASSIUM: 3.6 meq/L (ref 3.5–5.1)
Sodium: 139 mEq/L (ref 136–145)
Total Bilirubin: 0.41 mg/dL (ref 0.20–1.20)
Total Protein: 7.1 g/dL (ref 6.4–8.3)

## 2014-09-21 MED ORDER — DIPHENHYDRAMINE HCL 50 MG/ML IJ SOLN
INTRAMUSCULAR | Status: AC
  Filled 2014-09-21: qty 1

## 2014-09-21 MED ORDER — FAMOTIDINE IN NACL 20-0.9 MG/50ML-% IV SOLN
20.0000 mg | Freq: Once | INTRAVENOUS | Status: AC
  Administered 2014-09-21: 20 mg via INTRAVENOUS

## 2014-09-21 MED ORDER — SODIUM CHLORIDE 0.9 % IJ SOLN
10.0000 mL | INTRAMUSCULAR | Status: DC | PRN
  Administered 2014-09-21: 10 mL via INTRAVENOUS
  Filled 2014-09-21: qty 10

## 2014-09-21 MED ORDER — FAMOTIDINE IN NACL 20-0.9 MG/50ML-% IV SOLN
INTRAVENOUS | Status: AC
  Filled 2014-09-21: qty 50

## 2014-09-21 MED ORDER — SODIUM CHLORIDE 0.9 % IJ SOLN
10.0000 mL | INTRAMUSCULAR | Status: DC | PRN
  Administered 2014-09-21: 10 mL
  Filled 2014-09-21: qty 10

## 2014-09-21 MED ORDER — ONDANSETRON 8 MG/NS 50 ML IVPB
INTRAVENOUS | Status: AC
  Filled 2014-09-21: qty 8

## 2014-09-21 MED ORDER — LOPERAMIDE HCL 2 MG PO TABS: 2.0000 mg | ORAL_TABLET | Freq: Four times a day (QID) | ORAL | Status: DC | PRN

## 2014-09-21 MED ORDER — HEPARIN SOD (PORK) LOCK FLUSH 100 UNIT/ML IV SOLN
500.0000 [IU] | Freq: Once | INTRAVENOUS | Status: AC | PRN
  Administered 2014-09-21: 500 [IU]
  Filled 2014-09-21: qty 5

## 2014-09-21 MED ORDER — SODIUM CHLORIDE 0.9 % IV SOLN
Freq: Once | INTRAVENOUS | Status: AC
  Administered 2014-09-21: 15:00:00 via INTRAVENOUS

## 2014-09-21 MED ORDER — ONDANSETRON 8 MG/50ML IVPB (CHCC)
8.0000 mg | Freq: Once | INTRAVENOUS | Status: AC
  Administered 2014-09-21: 8 mg via INTRAVENOUS

## 2014-09-21 MED ORDER — PACLITAXEL CHEMO INJECTION 300 MG/50ML
80.0000 mg/m2 | Freq: Once | INTRAVENOUS | Status: AC
  Administered 2014-09-21: 174 mg via INTRAVENOUS
  Filled 2014-09-21: qty 29

## 2014-09-21 MED ORDER — DEXAMETHASONE SODIUM PHOSPHATE 10 MG/ML IJ SOLN
INTRAMUSCULAR | Status: AC
  Filled 2014-09-21: qty 1

## 2014-09-21 MED ORDER — DEXAMETHASONE SODIUM PHOSPHATE 10 MG/ML IJ SOLN
10.0000 mg | Freq: Once | INTRAMUSCULAR | Status: AC
  Administered 2014-09-21: 10 mg via INTRAVENOUS

## 2014-09-21 MED ORDER — DIPHENHYDRAMINE HCL 50 MG/ML IJ SOLN
50.0000 mg | Freq: Once | INTRAMUSCULAR | Status: AC
  Administered 2014-09-21: 50 mg via INTRAVENOUS

## 2014-09-21 NOTE — Progress Notes (Signed)
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 MRI breast on 07/16/2014 showed 7cm R breast lesion and 4.4cm r axillary node    07/02/2014 Imaging CT CAP: a 4.7cm mass in LUL lung and a 2.1cm mas in RML, and a small nodule in RUL, suspecious for metastasis     07/09/2014 Initial Diagnosis right IDA with b/l lung lesions, ER-/PR-/HER2+   07/09/2014 Initial Biopsy US guided right breast mass and axillary node biopsy showed IDA, and DCIS, ER-/PR-/HER2+   CURRENT THERAPY: weekly Paclitaxel 64m/m2, trastuzumab and pertuzumab every 3 weeks, started on 08/04/2014  INTERVAL HISTROY   NKalesereturns for follow-up and 8th dose of Taxol. She has been tolerated well overall, I did reduced her premedication dexamethasone to 10 mg, and she tolerated well without significant abdominal cramps or diarrhea, except a few days ago she did have a few episodes of diarrhea. No fever or chills. She has received 3 cycles of Herceptin and Perjeta, still has some mild skin rash, improved with topical steroids and clindamycin. No other new complaints.  MEDICAL HISTORY:  Past Medical History  Diagnosis Date  . Breast cancer   . Pneumonia     hx of pneumonia 08/2013   . GERD (gastroesophageal reflux disease)     during pregnancy   . Breast cancer      SURGICAL HISTORY: Past Surgical History  Procedure Laterality Date  . Cesarean section    . Essure tubal ligation    . Cholecystectomy    . Wisdom tooth extraction    . Portacath placement Left 07/21/2014    Procedure: INSERTION PORT-A-CATH;  Surgeon: FStark Klein MD;  Location: [facility];  Service: General;  Laterality: Left;    SOCIAL HISTORY: History   Social History  . Marital Status: Married    Spouse Name: N/A    Number of Children:  she has 2 daughters at the age of 1101and 17   . Years of Education: N/A   Occupational History  .  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 Smoking status: Never Smoker   . Smokeless tobacco: Not on file  . Alcohol Use: Yes  . Drug Use: No  . Sexual Activity: No    FAMILY HISTORY: Family History  Problem Relation Age of Onset  . Breast cancer Mother 580 . Liver cancer Father 647 . Breast cancer Maternal Aunt 36  . Prostate cancer Maternal Grandfather   . Liver cancer Paternal Grandmother   . Prostate cancer Paternal Grandfather      GENETICS: 08/30/2014 BreastNext panel was negative. 17 genes including BRCA1, BRCA2, were negative for mutations.   ALLERGIES:  is allergic to aspirin and codeine.  MEDICATIONS:  Current Outpatient Prescriptions  Medication Sig Dispense Refill  . albuterol (PROVENTIL HFA;VENTOLIN HFA) 108 (90 BASE) MCG/ACT inhaler  Inhale 2 puffs into the lungs every 4 (four) hours as needed for wheezing or shortness of breath.    . benzonatate (TESSALON) 100 MG capsule Take 1 capsule (100 mg total) by mouth 3 (three) times daily as needed for cough. 21 capsule 0  . Camphor-Eucalyptus-Menthol (VICKS VAPORUB EX) Apply 1 application topically at bedtime as needed (sinus). Applies under nose, throat and on chest.    . cetirizine (ZYRTEC) 10 MG tablet Take 10 mg by mouth daily as needed for allergies.     . clindamycin (CLINDAGEL) 1 % gel Apply topically to affected areas BID. 30 g 3   . HYDROCORTISONE, TOPICAL, 2 % LOTN Apply topically to affected areas BID. 29.6 mL 1  . ibuprofen (ADVIL,MOTRIN) 200 MG tablet Take 400-800 mg by mouth every 6 (six) hours as needed for headache or moderate pain.    Marland Kitchen lidocaine-prilocaine (EMLA) cream Apply 1 application topically as needed. Apply to portacath 1 1/2  -  2  Hours prior to procedures as needed. 30 g 1  . loperamide (IMODIUM A-D) 2 MG tablet Take 1 tablet (2 mg total) by mouth 4 (four) times daily as needed for diarrhea or loose stools. 30 tablet 0  . metaxalone (SKELAXIN) 800 MG tablet Take 800 mg by mouth 3 (three) times daily as needed for muscle spasms.    . Neomycin-Bacitracin-Polymyxin (TRIPLE ANTIBIOTIC EX) Apply 1 application topically 2 (two) times daily as needed (eczema on hands.).    Marland Kitchen ondansetron (ZOFRAN) 8 MG tablet Take 1 tablet (8 mg total) by mouth every 8 (eight) hours as needed for nausea or vomiting. 30 tablet 3  . oxyCODONE-acetaminophen (ROXICET) 5-325 MG per tablet Take 1-2 tablets by mouth every 4 (four) hours as needed for severe pain. 30 tablet 0  . Pseudoeph-Doxylamine-DM-APAP (NYQUIL PO) Take 30 mLs by mouth 2 (two) times daily as needed (cold/allergies).     . zolpidem (AMBIEN) 5 MG tablet Take 1 tablet (5 mg total) by mouth at bedtime as needed for sleep. 20 tablet 0   No current facility-administered medications for this visit.    REVIEW OF SYSTEMS:   Constitutional: Denies fevers, chills or abnormal night sweats, (+) malaise  Eyes: Denies blurriness of vision, double vision or watery eyes Ears, nose, mouth, throat, and face: Denies mucositis or sore throat Respiratory: (+) dry cough, no dyspnea or wheezes Cardiovascular: Denies palpitation, chest discomfort or lower extremity swelling Gastrointestinal:  Denies nausea, heartburn or change in bowel habits Skin:(+) rashes  Lymphatics: Denies new lymphadenopathy or easy bruising Neurological:Denies numbness, tingling or new  weaknesses Behavioral/Psych: Mood is stable, no new changes  All other systems were reviewed with the patient and are negative.  PHYSICAL EXAMINATION: ECOG PERFORMANCE STATUS: 0 - Asymptomatic  There were no vitals filed for this visit. There were no vitals filed for this visit.  GENERAL:alert, no distress and comfortable SKIN: skin color, texture, turgor are normal, no rashes or significant lesions EYES: normal, conjunctiva are pink and non-injected, sclera clear OROPHARYNX:no exudate, no erythema and lips, buccal mucosa, and tongue normal  NECK: supple, thyroid normal size, non-tender, without nodularity LYMPH:  no palpable lymphadenopathy in the cervical, axillary or inguinal LUNGS: clear to auscultation and percussion with normal breathing effort HEART: regular rate & rhythm and no murmurs and no lower extremity edema ABDOMEN:abdomen soft, non-tender and normal bowel sounds Musculoskeletal:no cyanosis of digits and no clubbing  PSYCH: alert & oriented x 3 with fluent speech NEURO: no focal motor/sensory deficits Breasts: Breast inspection  showed them to be symmetrical with no skin change or nipple discharge. Palpation of the right breast showed a 3X3.5cm mass in the lower inner quadrant, which is softer than before, and the previously palpable right axillary node is not palpable anymore except some fullness. Left breast  and axilla revealed no obvious mass that I could appreciate. Skin: multiple acne like macular skin rashes on face and scalp (she lost most of her hair), a few on upper chest. Rashes on hands have resolved.    LABORATORY DATA:  I have reviewed the data as listed Lab Results  Component Value Date   WBC 6.0 09/21/2014   HGB 11.2* 09/21/2014   HCT 33.1* 09/21/2014   MCV 87.1 09/21/2014   PLT 303 09/21/2014    Recent Labs  09/07/14 1340 09/14/14 1006 09/21/14 1244  NA 142 141 139  K 3.5 3.8 3.6  CO2 _0 GLUCOSE 104 97 81  BUN 10.6 11.3 10.8   CREATININE 0.7 0.7 0.8  CALCIUM 8.7 8.7 9.2  PROT 7.1 7.2 7.1  ALBUMIN 3.6 3.7 3.7  AST _1 ALT _2 ALKPHOS 67 70 72  BILITOT 0.48 0.41 0.41   PATHOLOGY REPORT 07/09/2014 #1 breast, right needle core biopsy more anterior -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 45)  Lung, biopsy, Left 07/26/2014 - HIGH GRADE CARCINOMA, SEE COMMENT. Microscopic Comment The carcinoma demonstrates the following immunophenotype: TTF-1 - negative expression. Napsin A- negative expression. CK5/6 - focal, moderate expression. estrogen receptor - negative expression. GCDFP- negative expression. The recent breast biopsy demonstrating Her2 amplified high grade invasive mammary carcinoma is noted ([id number]). Although the immunophenotype of the current case is non-sepcific, it strongly argues against primary lung adenocarcinoma. However, on re-review, the morphology of the current case is essentially identical to the primary mammary carcinoma. In lieu of further immunophenotyping, the tumor will be submitted for Her2 testing and the remaining tissue will be reserved for additional ancillary tumor testing. The results of the Her2 testing will be reported in an addendum. The case was discussed with Dr Burr Medico on 07/28/2015 and 07/29/2015 HER2 POSITIVE    RADIOGRAPHIC STUDIES: CT CAP 07/20/2014 IMPRESSION: 1. Right breast mass is compatible with known malignancy. Adjacent soft tissue masses may represent satellite lesions. Recommend correlation with mammography, ultrasound or MRI. 2. Left upper lobe, right middle lobe and bilateral lower lobe pulmonary masses/nodules are concerning for metastatic disease. 3. Right axillary and left hilar adenopathy are concerning  for metastatic disease. 4. 3 mm nonobstructing right renal stone.   ECHO 12/8 Impressions: - Normal biventricular size and systolic function. Impaired relaxation with normal filling pressures. Mild tricupsid regurgitation with normal RVSP.  ASSESSMENT & PLAN:  44 year old African-American female, premenopausal, without significant past medical history, presented with palpable right breast mass and right axillary mass.   1. 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 -continue weekly paclitaxel, trastuzumab and pertuzumab every 3 weeks. I'll evaluate her response to the treatment in a few weeks by a restaging CT scan, I will likely to continue the this regiment for 6-9 months. If she has excellent response, then I'll  change to Herceptin maintenance therapy. -G1 abdominal cramps and diarrhea, likely secondary to Taxol. She also had mild reaction to Herceptin or pertuzumab, on solu-medro 37m iv as premeds.  -She had normal echo at baseline, we'll repeat her echo  every 3 months when she is on dual anti-HER-2 therapy. -continue her pre-med dexamethasone 0 mg before each dose of Taxol. She has been tolerating it well.  2. Skin rashes on face and scalp, likely related to antibody therapy  -The rash on upper chest much improved with topical steroids - I told her to use topical steroids once on face and clindamycin gel twice on face.   Plan -CT chest abdomen and pelvis in a week or 2 -cont current chemotherapy. -RTC in 2 weeks to discuss the scan results.   I spent about 20 minutes counseling the patient and her family members, total care was about 25 minutes.  FTruitt Merle 09/21/2014

## 2014-09-21 NOTE — Patient Instructions (Signed)

## 2014-09-21 NOTE — Telephone Encounter (Signed)
Per staff message and POF I have scheduled appts. Advised scheduler of appts. JMW  

## 2014-09-21 NOTE — Telephone Encounter (Signed)
Gave avs & calendar for February/March. Sent message to schedule treatment.

## 2014-09-21 NOTE — Patient Instructions (Signed)
[facility] Discharge Instructions for Patients Receiving Chemotherapy  Today you received the following chemotherapy agents: Taxol.  To help prevent nausea and vomiting after your treatment, we encourage you to take your nausea medication as prescribed.   If you develop nausea and vomiting that is not controlled by your nausea medication, call the clinic.   BELOW ARE SYMPTOMS THAT SHOULD BE REPORTED IMMEDIATELY:  *FEVER GREATER THAN 100.5 F  *CHILLS WITH OR WITHOUT FEVER  NAUSEA AND VOMITING THAT IS NOT CONTROLLED WITH YOUR NAUSEA MEDICATION  *UNUSUAL SHORTNESS OF BREATH  *UNUSUAL BRUISING OR BLEEDING  TENDERNESS IN MOUTH AND THROAT WITH OR WITHOUT PRESENCE OF ULCERS  *URINARY PROBLEMS  *BOWEL PROBLEMS  UNUSUAL RASH Items with * indicate a potential emergency and should be followed up as soon as possible.  Feel free to call the clinic you have any questions or concerns. The clinic phone number is [phone].    

## 2014-09-21 NOTE — Telephone Encounter (Signed)
Gave updated calendar for February thru May.

## 2014-09-23 ENCOUNTER — Telehealth: Payer: Self-pay | Admitting: *Deleted

## 2014-09-23 NOTE — Telephone Encounter (Signed)
Per staff message I have scheduled 3/1 appt

## 2014-09-25 ENCOUNTER — Encounter: Payer: Self-pay | Admitting: Hematology

## 2014-09-28 ENCOUNTER — Ambulatory Visit: Payer: BLUE CROSS/BLUE SHIELD

## 2014-09-28 ENCOUNTER — Ambulatory Visit (HOSPITAL_BASED_OUTPATIENT_CLINIC_OR_DEPARTMENT_OTHER): Payer: BLUE CROSS/BLUE SHIELD

## 2014-09-28 ENCOUNTER — Other Ambulatory Visit (HOSPITAL_BASED_OUTPATIENT_CLINIC_OR_DEPARTMENT_OTHER): Payer: BLUE CROSS/BLUE SHIELD

## 2014-09-28 ENCOUNTER — Ambulatory Visit: Payer: BLUE CROSS/BLUE SHIELD | Admitting: Nutrition

## 2014-09-28 DIAGNOSIS — C50311 Malignant neoplasm of lower-inner quadrant of right female breast: Secondary | ICD-10-CM

## 2014-09-28 DIAGNOSIS — Z95828 Presence of other vascular implants and grafts: Secondary | ICD-10-CM

## 2014-09-28 DIAGNOSIS — C773 Secondary and unspecified malignant neoplasm of axilla and upper limb lymph nodes: Secondary | ICD-10-CM

## 2014-09-28 DIAGNOSIS — C50911 Malignant neoplasm of unspecified site of right female breast: Secondary | ICD-10-CM

## 2014-09-28 DIAGNOSIS — Z5111 Encounter for antineoplastic chemotherapy: Secondary | ICD-10-CM

## 2014-09-28 DIAGNOSIS — C7800 Secondary malignant neoplasm of unspecified lung: Principal | ICD-10-CM

## 2014-09-28 DIAGNOSIS — C7802 Secondary malignant neoplasm of left lung: Secondary | ICD-10-CM

## 2014-09-28 LAB — COMPREHENSIVE METABOLIC PANEL (CC13)
ALT: 26 U/L (ref 0–55)
AST: 20 U/L (ref 5–34)
Albumin: 3.7 g/dL (ref 3.5–5.0)
Alkaline Phosphatase: 70 U/L (ref 40–150)
Anion Gap: 9 mEq/L (ref 3–11)
BUN: 10 mg/dL (ref 7.0–26.0)
CALCIUM: 9.4 mg/dL (ref 8.4–10.4)
CO2: 26 meq/L (ref 22–29)
Chloride: 106 mEq/L (ref 98–109)
Creatinine: 0.7 mg/dL (ref 0.6–1.1)
EGFR: >90 mL/min/{1.73_m2} (ref >90)
GLUCOSE: 97 mg/dL (ref 70–140)
POTASSIUM: 3.6 meq/L (ref 3.5–5.1)
Sodium: 141 mEq/L (ref 136–145)
TOTAL PROTEIN: 7 g/dL (ref 6.4–8.3)
Total Bilirubin: 0.48 mg/dL (ref 0.20–1.20)

## 2014-09-28 LAB — CBC WITH DIFFERENTIAL/PLATELET
BASO%: 0.9 % (ref 0.0–2.0)
Basophils Absolute: 0.1 10*3/uL (ref 0.0–0.1)
EOS%: 1.4 % (ref 0.0–7.0)
Eosinophils Absolute: 0.1 10*3/uL (ref 0.0–0.5)
HCT: 33.3 % — ABNORMAL LOW (ref 34.8–46.6)
HGB: 10.7 g/dL — ABNORMAL LOW (ref 11.6–15.9)
LYMPH#: 2.1 10*3/uL (ref 0.9–3.3)
LYMPH%: 33.4 % (ref 14.0–49.7)
MCH: 28 pg (ref 25.1–34.0)
MCHC: 32.1 g/dL (ref 31.5–36.0)
MCV: 87.3 fL (ref 79.5–101.0)
MONO#: 0.4 10*3/uL (ref 0.1–0.9)
MONO%: 7 % (ref 0.0–14.0)
NEUT#: 3.7 10*3/uL (ref 1.5–6.5)
NEUT%: 57.3 % (ref 38.4–76.8)
Platelets: 313 10*3/uL (ref 145–400)
RBC: 3.82 10*6/uL (ref 3.70–5.45)
RDW: 16 % — AB (ref 11.2–14.5)
WBC: 6.4 10*3/uL (ref 3.9–10.3)

## 2014-09-28 MED ORDER — HEPARIN SOD (PORK) LOCK FLUSH 100 UNIT/ML IV SOLN
500.0000 [IU] | Freq: Once | INTRAVENOUS | Status: AC | PRN
  Administered 2014-09-28: 500 [IU]
  Filled 2014-09-28: qty 5

## 2014-09-28 MED ORDER — SODIUM CHLORIDE 0.9 % IJ SOLN
10.0000 mL | INTRAMUSCULAR | Status: DC | PRN
  Administered 2014-09-28: 10 mL
  Filled 2014-09-28: qty 10

## 2014-09-28 MED ORDER — DEXAMETHASONE SODIUM PHOSPHATE 10 MG/ML IJ SOLN
INTRAMUSCULAR | Status: AC
  Filled 2014-09-28: qty 1

## 2014-09-28 MED ORDER — ONDANSETRON 8 MG/50ML IVPB (CHCC)
8.0000 mg | Freq: Once | INTRAVENOUS | Status: AC
  Administered 2014-09-28: 8 mg via INTRAVENOUS

## 2014-09-28 MED ORDER — SODIUM CHLORIDE 0.9 % IJ SOLN
10.0000 mL | INTRAMUSCULAR | Status: DC | PRN
  Administered 2014-09-28: 10 mL via INTRAVENOUS
  Filled 2014-09-28: qty 10

## 2014-09-28 MED ORDER — PACLITAXEL CHEMO INJECTION 300 MG/50ML
80.0000 mg/m2 | Freq: Once | INTRAVENOUS | Status: AC
  Administered 2014-09-28: 174 mg via INTRAVENOUS
  Filled 2014-09-28: qty 29

## 2014-09-28 MED ORDER — DEXAMETHASONE SODIUM PHOSPHATE 10 MG/ML IJ SOLN
10.0000 mg | Freq: Once | INTRAMUSCULAR | Status: AC
  Administered 2014-09-28: 10 mg via INTRAVENOUS

## 2014-09-28 MED ORDER — FAMOTIDINE IN NACL 20-0.9 MG/50ML-% IV SOLN
INTRAVENOUS | Status: AC
  Filled 2014-09-28: qty 50

## 2014-09-28 MED ORDER — DIPHENHYDRAMINE HCL 50 MG/ML IJ SOLN
50.0000 mg | Freq: Once | INTRAMUSCULAR | Status: AC
  Administered 2014-09-28: 50 mg via INTRAVENOUS

## 2014-09-28 MED ORDER — SODIUM CHLORIDE 0.9 % IV SOLN
Freq: Once | INTRAVENOUS | Status: AC
  Administered 2014-09-28: 15:00:00 via INTRAVENOUS

## 2014-09-28 MED ORDER — DIPHENHYDRAMINE HCL 50 MG/ML IJ SOLN
INTRAMUSCULAR | Status: AC
  Filled 2014-09-28: qty 1

## 2014-09-28 MED ORDER — FAMOTIDINE IN NACL 20-0.9 MG/50ML-% IV SOLN
20.0000 mg | Freq: Once | INTRAVENOUS | Status: AC
  Administered 2014-09-28: 20 mg via INTRAVENOUS

## 2014-09-28 NOTE — Progress Notes (Signed)
Dr. Burr Medico notified of pt having diarrhea and continued numbness/tingling to fingers. Okay to proceed with treatment today. Encouraged patient to take imodium and notify us if diarrhea continues despite imodium. Pt verbalized understanding.

## 2014-09-28 NOTE — Progress Notes (Signed)
Nutrition follow-up with 44 year old female in chemotherapy who has been diagnosed with breast cancer.  Weight decreased and documented as 209 pounds down from 213.2 pounds January 26. Patient reports 3-5 stools daily.  She has not been taking Imodium as directed. Patient reports she has avoided raw fruits and vegetables.  Nutrition diagnosis: Unintended weight loss continues.  Intervention:  I enforced education on low fiber diet. Encouraged patient to take diarrheal medication as prescribed by physician. Recommended patient add one oral nutrition supplement daily.  Patient reports she enjoyed ensure complete. Provided coupons. Teach back method used.  Monitoring, evaluation, goals: Patient will work to increase calories and protein to minimize weight loss.  Next visit:Tuesday, March 1 during chemotherapy.  **Disclaimer: This note was dictated with voice recognition software. Similar sounding words can inadvertently be transcribed and this note may contain transcription errors which may not have been corrected upon publication of note.**

## 2014-09-28 NOTE — Patient Instructions (Signed)
[facility] Discharge Instructions for Patients Receiving Chemotherapy  Today you received the following chemotherapy agents: Taxol.  To help prevent nausea and vomiting after your treatment, we encourage you to take your nausea medication as prescribed.   If you develop nausea and vomiting that is not controlled by your nausea medication, call the clinic.   BELOW ARE SYMPTOMS THAT SHOULD BE REPORTED IMMEDIATELY:  *FEVER GREATER THAN 100.5 F  *CHILLS WITH OR WITHOUT FEVER  NAUSEA AND VOMITING THAT IS NOT CONTROLLED WITH YOUR NAUSEA MEDICATION  *UNUSUAL SHORTNESS OF BREATH  *UNUSUAL BRUISING OR BLEEDING  TENDERNESS IN MOUTH AND THROAT WITH OR WITHOUT PRESENCE OF ULCERS  *URINARY PROBLEMS  *BOWEL PROBLEMS  UNUSUAL RASH Items with * indicate a potential emergency and should be followed up as soon as possible.  Feel free to call the clinic you have any questions or concerns. The clinic phone number is [phone].    

## 2014-09-28 NOTE — Patient Instructions (Signed)

## 2014-09-30 ENCOUNTER — Telehealth: Payer: Self-pay | Admitting: *Deleted

## 2014-09-30 MED ORDER — AMOXICILLIN-POT CLAVULANATE 600-42.9 MG/5ML PO SUSR: 600.0000 mg | Freq: Two times a day (BID) | ORAL | Status: DC

## 2014-09-30 NOTE — Telephone Encounter (Signed)
Received call from patient stating she has been having some congestion with green drainage since yesterday.  She was here for chemo on Tuesday and felt fine.  Patient is not sure whether or not she has a fever because she is at work.  Informed her that we may be able to call in an antibiotic for her and I will discuss with Dr. Burr Medico.  Patient states it needs to be a liquid because she cannot get pills down.     Discussed with Dr.Feng. Rx called in to her pharmacy.  Informed patient to monitor her temperature and she would need to come in if she develops a fever or if it is after hours to go the emergency room per Dr. Burr Medico.  Patient verbalized understanding.

## 2014-09-30 NOTE — Addendum Note (Signed)
Addended by: [name] on: 09/30/2014 01:14 PM   Modules accepted: Orders

## 2014-10-03 ENCOUNTER — Other Ambulatory Visit: Payer: Self-pay | Admitting: Nurse Practitioner

## 2014-10-04 ENCOUNTER — Encounter (HOSPITAL_COMMUNITY): Payer: Self-pay

## 2014-10-04 ENCOUNTER — Ambulatory Visit (HOSPITAL_COMMUNITY)
Admission: RE | Admit: 2014-10-04 | Discharge: 2014-10-04 | Disposition: A | Discharge status: Home / Self Care | Payer: BLUE CROSS/BLUE SHIELD | Source: Ambulatory Visit | Attending: Hematology | Admitting: Hematology

## 2014-10-04 DIAGNOSIS — N200 Calculus of kidney: Secondary | ICD-10-CM | POA: Insufficient documentation

## 2014-10-04 DIAGNOSIS — C7800 Secondary malignant neoplasm of unspecified lung: Secondary | ICD-10-CM | POA: Insufficient documentation

## 2014-10-04 DIAGNOSIS — C50911 Malignant neoplasm of unspecified site of right female breast: Secondary | ICD-10-CM | POA: Diagnosis not present

## 2014-10-04 DIAGNOSIS — Z08 Encounter for follow-up examination after completed treatment for malignant neoplasm: Secondary | ICD-10-CM | POA: Diagnosis not present

## 2014-10-04 MED ORDER — IOHEXOL 300 MG/ML  SOLN
100.0000 mL | Freq: Once | INTRAMUSCULAR | Status: AC | PRN
  Administered 2014-10-04: 100 mL via INTRAVENOUS

## 2014-10-05 ENCOUNTER — Telehealth: Payer: Self-pay | Admitting: *Deleted

## 2014-10-05 ENCOUNTER — Ambulatory Visit: Payer: BLUE CROSS/BLUE SHIELD | Admitting: Hematology

## 2014-10-05 ENCOUNTER — Other Ambulatory Visit: Payer: BLUE CROSS/BLUE SHIELD

## 2014-10-05 ENCOUNTER — Ambulatory Visit: Payer: BLUE CROSS/BLUE SHIELD

## 2014-10-05 ENCOUNTER — Encounter: Payer: Self-pay | Admitting: Hematology

## 2014-10-05 ENCOUNTER — Ambulatory Visit (HOSPITAL_BASED_OUTPATIENT_CLINIC_OR_DEPARTMENT_OTHER): Payer: BLUE CROSS/BLUE SHIELD

## 2014-10-05 ENCOUNTER — Telehealth: Payer: Self-pay | Admitting: Hematology

## 2014-10-05 ENCOUNTER — Ambulatory Visit (HOSPITAL_BASED_OUTPATIENT_CLINIC_OR_DEPARTMENT_OTHER): Payer: BLUE CROSS/BLUE SHIELD | Admitting: Hematology

## 2014-10-05 ENCOUNTER — Other Ambulatory Visit (HOSPITAL_BASED_OUTPATIENT_CLINIC_OR_DEPARTMENT_OTHER): Payer: BLUE CROSS/BLUE SHIELD

## 2014-10-05 VITALS — BP 157/81 | HR 102 | Temp 98.3°F | Resp 20 | Ht 70.0 in | Wt 208.1 lb

## 2014-10-05 DIAGNOSIS — C50911 Malignant neoplasm of unspecified site of right female breast: Secondary | ICD-10-CM

## 2014-10-05 DIAGNOSIS — C50511 Malignant neoplasm of lower-outer quadrant of right female breast: Secondary | ICD-10-CM

## 2014-10-05 DIAGNOSIS — C50311 Malignant neoplasm of lower-inner quadrant of right female breast: Secondary | ICD-10-CM

## 2014-10-05 DIAGNOSIS — C773 Secondary and unspecified malignant neoplasm of axilla and upper limb lymph nodes: Secondary | ICD-10-CM

## 2014-10-05 DIAGNOSIS — C7802 Secondary malignant neoplasm of left lung: Secondary | ICD-10-CM

## 2014-10-05 DIAGNOSIS — Z5112 Encounter for antineoplastic immunotherapy: Secondary | ICD-10-CM

## 2014-10-05 DIAGNOSIS — Z5111 Encounter for antineoplastic chemotherapy: Secondary | ICD-10-CM

## 2014-10-05 DIAGNOSIS — R21 Rash and other nonspecific skin eruption: Secondary | ICD-10-CM

## 2014-10-05 DIAGNOSIS — C7800 Secondary malignant neoplasm of unspecified lung: Principal | ICD-10-CM

## 2014-10-05 DIAGNOSIS — Z95828 Presence of other vascular implants and grafts: Secondary | ICD-10-CM

## 2014-10-05 LAB — COMPREHENSIVE METABOLIC PANEL (CC13)
ALT: 32 U/L (ref 0–55)
ANION GAP: 11 meq/L (ref 3–11)
AST: 23 U/L (ref 5–34)
Albumin: 3.6 g/dL (ref 3.5–5.0)
Alkaline Phosphatase: 72 U/L (ref 40–150)
BILIRUBIN TOTAL: 0.46 mg/dL (ref 0.20–1.20)
BUN: 8.7 mg/dL (ref 7.0–26.0)
CO2: 26 mEq/L (ref 22–29)
CREATININE: 0.8 mg/dL (ref 0.6–1.1)
Calcium: 9 mg/dL (ref 8.4–10.4)
Chloride: 105 mEq/L (ref 98–109)
Glucose: 115 mg/dl (ref 70–140)
Potassium: 3.9 mEq/L (ref 3.5–5.1)
Sodium: 142 mEq/L (ref 136–145)
Total Protein: 7.2 g/dL (ref 6.4–8.3)

## 2014-10-05 LAB — CBC WITH DIFFERENTIAL/PLATELET
BASO%: 0.5 % (ref 0.0–2.0)
Basophils Absolute: 0 10*3/uL (ref 0.0–0.1)
EOS%: 1.2 % (ref 0.0–7.0)
Eosinophils Absolute: 0.1 10*3/uL (ref 0.0–0.5)
HCT: 32.7 % — ABNORMAL LOW (ref 34.8–46.6)
HGB: 10.6 g/dL — ABNORMAL LOW (ref 11.6–15.9)
LYMPH%: 23.7 % (ref 14.0–49.7)
MCH: 28.4 pg (ref 25.1–34.0)
MCHC: 32.4 g/dL (ref 31.5–36.0)
MCV: 87.6 fL (ref 79.5–101.0)
MONO#: 0.4 10*3/uL (ref 0.1–0.9)
MONO%: 5.8 % (ref 0.0–14.0)
NEUT#: 5.2 10*3/uL (ref 1.5–6.5)
NEUT%: 68.8 % (ref 38.4–76.8)
Platelets: 302 10*3/uL (ref 145–400)
RBC: 3.74 10*6/uL (ref 3.70–5.45)
RDW: 15.6 % — ABNORMAL HIGH (ref 11.2–14.5)
WBC: 7.6 10*3/uL (ref 3.9–10.3)
lymph#: 1.8 10*3/uL (ref 0.9–3.3)

## 2014-10-05 MED ORDER — FAMOTIDINE IN NACL 20-0.9 MG/50ML-% IV SOLN
20.0000 mg | Freq: Once | INTRAVENOUS | Status: AC
  Administered 2014-10-05: 20 mg via INTRAVENOUS

## 2014-10-05 MED ORDER — DIPHENHYDRAMINE HCL 50 MG/ML IJ SOLN
INTRAMUSCULAR | Status: AC
  Filled 2014-10-05: qty 1

## 2014-10-05 MED ORDER — ACETAMINOPHEN 325 MG PO TABS
ORAL_TABLET | ORAL | Status: AC
  Filled 2014-10-05: qty 2

## 2014-10-05 MED ORDER — DIPHENHYDRAMINE HCL 50 MG/ML IJ SOLN
50.0000 mg | Freq: Once | INTRAMUSCULAR | Status: AC
  Administered 2014-10-05: 50 mg via INTRAVENOUS

## 2014-10-05 MED ORDER — ACETAMINOPHEN 325 MG PO TABS
650.0000 mg | ORAL_TABLET | Freq: Once | ORAL | Status: AC
  Administered 2014-10-05: 650 mg via ORAL

## 2014-10-05 MED ORDER — SODIUM CHLORIDE 0.9 % IV SOLN
Freq: Once | INTRAVENOUS | Status: AC
  Administered 2014-10-05: 12:00:00 via INTRAVENOUS

## 2014-10-05 MED ORDER — METHYLPREDNISOLONE SODIUM SUCC 40 MG IJ SOLR
INTRAMUSCULAR | Status: AC
  Filled 2014-10-05: qty 1

## 2014-10-05 MED ORDER — HEPARIN SOD (PORK) LOCK FLUSH 100 UNIT/ML IV SOLN
500.0000 [IU] | Freq: Once | INTRAVENOUS | Status: AC | PRN
  Administered 2014-10-05: 500 [IU]
  Filled 2014-10-05: qty 5

## 2014-10-05 MED ORDER — SODIUM CHLORIDE 0.9 % IJ SOLN
10.0000 mL | INTRAMUSCULAR | Status: DC | PRN
  Administered 2014-10-05: 10 mL via INTRAVENOUS
  Filled 2014-10-05: qty 10

## 2014-10-05 MED ORDER — PERTUZUMAB CHEMO INJECTION 420 MG/14ML
420.0000 mg | Freq: Once | INTRAVENOUS | Status: AC
  Administered 2014-10-05: 420 mg via INTRAVENOUS
  Filled 2014-10-05: qty 14

## 2014-10-05 MED ORDER — TRASTUZUMAB CHEMO INJECTION 440 MG
6.0000 mg/kg | Freq: Once | INTRAVENOUS | Status: AC
  Administered 2014-10-05: 588 mg via INTRAVENOUS
  Filled 2014-10-05: qty 28

## 2014-10-05 MED ORDER — DEXAMETHASONE SODIUM PHOSPHATE 20 MG/5ML IJ SOLN
INTRAMUSCULAR | Status: AC
  Filled 2014-10-05: qty 5

## 2014-10-05 MED ORDER — ONDANSETRON 8 MG/50ML IVPB (CHCC)
8.0000 mg | Freq: Once | INTRAVENOUS | Status: AC
  Administered 2014-10-05: 8 mg via INTRAVENOUS

## 2014-10-05 MED ORDER — FAMOTIDINE IN NACL 20-0.9 MG/50ML-% IV SOLN
INTRAVENOUS | Status: AC
  Filled 2014-10-05: qty 50

## 2014-10-05 MED ORDER — DEXAMETHASONE SODIUM PHOSPHATE 20 MG/5ML IJ SOLN
20.0000 mg | Freq: Once | INTRAMUSCULAR | Status: AC
  Administered 2014-10-05: 20 mg via INTRAVENOUS

## 2014-10-05 MED ORDER — SODIUM CHLORIDE 0.9 % IJ SOLN
10.0000 mL | INTRAMUSCULAR | Status: DC | PRN
  Administered 2014-10-05: 10 mL
  Filled 2014-10-05: qty 10

## 2014-10-05 MED ORDER — METHYLPREDNISOLONE SODIUM SUCC 40 MG IJ SOLR
40.0000 mg | Freq: Once | INTRAMUSCULAR | Status: AC
  Administered 2014-10-05: 40 mg via INTRAVENOUS

## 2014-10-05 MED ORDER — ONDANSETRON 8 MG/NS 50 ML IVPB
INTRAVENOUS | Status: AC
  Filled 2014-10-05: qty 8

## 2014-10-05 MED ORDER — PACLITAXEL CHEMO INJECTION 300 MG/50ML
80.0000 mg/m2 | Freq: Once | INTRAVENOUS | Status: AC
  Administered 2014-10-05: 174 mg via INTRAVENOUS
  Filled 2014-10-05: qty 29

## 2014-10-05 NOTE — Patient Instructions (Signed)

## 2014-10-05 NOTE — Progress Notes (Signed)
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 MRI breast on 07/16/2014 showed 7cm R breast lesion and 4.4cm r axillary node    07/02/2014 Imaging CT CAP: a 4.7cm mass in LUL lung and a 2.1cm mas in RML, and a small nodule in RUL, suspecious for metastasis     07/09/2014 Initial Diagnosis right IDA with b/l lung lesions, ER-/PR-/HER2+   07/09/2014 Initial Biopsy US guided right breast mass and axillary node biopsy showed IDA, and DCIS, ER-/PR-/HER2+   CURRENT THERAPY: weekly Paclitaxel 25m/m2, trastuzumab and pertuzumab every 3 weeks, started on 08/04/2014  INTERVAL HISTROY   [place] and 10th dose of Taxol. She has been tolerated well overall, she has had more abdominal cramps, diarrhea and nausea  after chemotherapy in the past 2-3 weeks, which lasts about 1-3 days, possible related to reduced dexa dose. She also developed productive cough, and nasal congestion last Friday, no fever or chill, and I called in Augmentin for her. Her symptoms slightly improved. No other complains. Mild skin rash after prior cycle Herceptin and Perjeta.   MEDICAL HISTORY:  Past Medical History  Diagnosis Date  . Breast cancer   . Pneumonia     hx of pneumonia 08/2013   . GERD (gastroesophageal reflux disease)     during  pregnancy   . Breast cancer     SURGICAL HISTORY: Past Surgical History  Procedure Laterality Date  . Cesarean section    . Essure tubal ligation    . Cholecystectomy    . Wisdom tooth extraction    . Portacath placement Left 07/21/2014    Procedure: INSERTION PORT-A-CATH;  Surgeon: FStark Klein MD;  Location: [facility];  Service: General;  Laterality: Left;    SOCIAL HISTORY: History   Social History  . Marital Status: Married    Spouse Name: N/A    Number of Children:  she has 2 daughters at the age of 89and 134   . Years of Education: N/A   Occupational History  .  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 Smoking status: Never Smoker   . Smokeless tobacco: Not on file  . Alcohol Use: Yes  . Drug Use: No  . Sexual Activity: No    FAMILY HISTORY: Family History  Problem Relation Age of Onset  . Breast cancer Mother 568 . Liver cancer Father 621 . Breast cancer Maternal Aunt 36  . Prostate cancer Maternal Grandfather   . Liver cancer Paternal Grandmother   . Prostate cancer Paternal Grandfather      GENETICS: 08/30/2014 BreastNext panel was negative. 17 genes including BRCA1, BRCA2, were negative for mutations.   ALLERGIES:  is allergic to aspirin and codeine.  MEDICATIONS:  Current Outpatient Prescriptions  Medication Sig Dispense Refill  . albuterol (PROVENTIL HFA;VENTOLIN HFA)  108 (90 BASE) MCG/ACT inhaler Inhale 2 puffs into the lungs every 4 (four) hours as needed for wheezing or shortness of breath.    Marland Kitchen amoxicillin-clavulanate (AUGMENTIN ES-600) 600-42.9 MG/5ML suspension Take 5 mLs (600 mg total) by mouth 2 (two) times daily. 80 mL 0  . benzonatate (TESSALON) 100 MG capsule Take 1 capsule (100 mg total) by mouth 3 (three) times daily as needed for cough. 21 capsule 0  . Camphor-Eucalyptus-Menthol (VICKS VAPORUB EX) Apply 1 application topically at bedtime as needed (sinus). Applies under nose, throat and on chest.    .  cetirizine (ZYRTEC) 10 MG tablet Take 10 mg by mouth daily as needed for allergies.     . clindamycin (CLINDAGEL) 1 % gel Apply topically to affected areas BID. 30 g 3  . hydrocortisone 2.5 % cream APPLY TOPICALLY TO THE AFFECTED AREA TWICE DAILY 30 g 0  . HYDROCORTISONE, TOPICAL, 2 % LOTN Apply topically to affected areas BID. 29.6 mL 1  . ibuprofen (ADVIL,MOTRIN) 200 MG tablet Take 400-800 mg by mouth every 6 (six) hours as needed for headache or moderate pain.    Marland Kitchen lidocaine-prilocaine (EMLA) cream Apply 1 application topically as needed. Apply to portacath 1 1/2  -  2  Hours prior to procedures as needed. 30 g 1  . loperamide (IMODIUM A-D) 2 MG tablet Take 1 tablet (2 mg total) by mouth 4 (four) times daily as needed for diarrhea or loose stools. 30 tablet 0  . metaxalone (SKELAXIN) 800 MG tablet Take 800 mg by mouth 3 (three) times daily as needed for muscle spasms.    . Neomycin-Bacitracin-Polymyxin (TRIPLE ANTIBIOTIC EX) Apply 1 application topically 2 (two) times daily as needed (eczema on hands.).    Marland Kitchen ondansetron (ZOFRAN) 8 MG tablet Take 1 tablet (8 mg total) by mouth every 8 (eight) hours as needed for nausea or vomiting. 30 tablet 3  . oxyCODONE-acetaminophen (ROXICET) 5-325 MG per tablet Take 1-2 tablets by mouth every 4 (four) hours as needed for severe pain. 30 tablet 0  . Pseudoeph-Doxylamine-DM-APAP (NYQUIL PO) Take 30 mLs by mouth 2 (two) times daily as needed (cold/allergies).     . zolpidem (AMBIEN) 5 MG tablet Take 1 tablet (5 mg total) by mouth at bedtime as needed for sleep. 20 tablet 0   No current facility-administered medications for this visit.    REVIEW OF SYSTEMS:   Constitutional: Denies fevers, chills or abnormal night sweats, (+) malaise  Eyes: Denies blurriness of vision, double vision or watery eyes Ears, nose, mouth, throat, and face: Denies mucositis or sore throat Respiratory: (+) productive cough, no dyspnea or wheezes Cardiovascular: Denies palpitation,  chest discomfort or lower extremity swelling Gastrointestinal:  Denies nausea, heartburn or change in bowel habits Skin:(+) rashes  Lymphatics: Denies new lymphadenopathy or easy bruising Neurological:Denies numbness, tingling or new weaknesses Behavioral/Psych: Mood is stable, no new changes  All other systems were reviewed with the patient and are negative.  PHYSICAL EXAMINATION: ECOG PERFORMANCE STATUS: 0 - Asymptomatic  Filed Vitals:   10/05/14 1014  BP: 157/81  Pulse: 102  Temp: 98.3 F (36.8 C)  Resp: 20   Filed Weights   10/05/14 1014  Weight: 208 lb 1.6 oz (94.394 kg)    GENERAL:alert, no distress and comfortable SKIN: skin color, texture, turgor are normal, no rashes or significant lesions EYES: normal, conjunctiva are pink and non-injected, sclera clear OROPHARYNX:no exudate, no erythema and lips, buccal mucosa, and tongue normal  NECK: supple, thyroid normal size, non-tender,  without nodularity LYMPH:  no palpable lymphadenopathy in the cervical, axillary or inguinal LUNGS: clear to auscultation and percussion with normal breathing effort HEART: regular rate & rhythm and no murmurs and no lower extremity edema ABDOMEN:abdomen soft, non-tender and normal bowel sounds Musculoskeletal:no cyanosis of digits and no clubbing  PSYCH: alert & oriented x 3 with fluent speech NEURO: no focal motor/sensory deficits Breasts: Breast inspection showed them to be symmetrical with no skin change or nipple discharge. Palpation of the right breast showed a 3X3.5cm mass in the lower inner quadrant, which is softer than before, and the previously palpable right axillary node is not palpable anymore except some fullness. Left breast  and axilla revealed no obvious mass that I could appreciate. Skin: multiple acne like macular skin rashes on face and scalp (she lost most of her hair), a few on upper chest. Rashes on hands have resolved.    LABORATORY DATA:  I have reviewed the data as  listed Lab Results  Component Value Date   WBC 7.6 10/05/2014   HGB 10.6* 10/05/2014   HCT 32.7* 10/05/2014   MCV 87.6 10/05/2014   PLT 302 10/05/2014    Recent Labs  09/14/14 1006 09/21/14 1244 09/28/14 1421  NA 141 139 141  K 3.8 3.6 3.6  CO2 25 24 26   GLUCOSE 97 81 97  BUN 11.3 10.8 10.0  CREATININE 0.7 0.8 0.7  CALCIUM 8.7 9.2 9.4  PROT 7.2 7.1 7.0  ALBUMIN 3.7 3.7 3.7  AST 21 20 20   ALT 28 31 26   ALKPHOS 70 72 70  BILITOT 0.41 0.41 0.48   PATHOLOGY REPORT 07/09/2014 #1 breast, right needle core biopsy more anterior -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 45)  Lung, biopsy, Left 07/26/2014 - HIGH GRADE CARCINOMA, SEE COMMENT. Microscopic Comment The carcinoma demonstrates the following immunophenotype: TTF-1 - negative expression. Napsin A- negative expression. CK5/6 - focal, moderate expression. estrogen receptor - negative expression. GCDFP- negative expression. The recent breast biopsy demonstrating Her2 amplified high grade invasive mammary carcinoma is noted ([id number]). Although the immunophenotype of the current case is non-sepcific, it strongly argues against primary lung adenocarcinoma. However, on re-review, the morphology of the current case is essentially identical to the primary mammary carcinoma. In lieu of further immunophenotyping, the tumor will be submitted for Her2 testing and the remaining tissue will be reserved for additional ancillary tumor testing. The results of the Her2 testing will be reported in an addendum. The case was discussed with Dr Burr Medico on 07/28/2015 and 07/29/2015 HER2 POSITIVE    RADIOGRAPHIC STUDIES: CT CAP 10/04/2014 IMPRESSION: Interval decrease in the right axillary  lymphadenopathy.  Bilateral pulmonary lesions with left hilar lymphadenopathy also markedly decreased in the interval. The left hilar lymphadenopathy has resolved.  3 millimeter nonobstructing right renal stone.    ECHO 12/8 Impressions: - Normal biventricular size and systolic function. Impaired relaxation with normal filling pressures. Mild tricupsid regurgitation with normal RVSP.  ASSESSMENT & PLAN:  45 year old African-American female, premenopausal, without significant past medical history, presented with palpable right breast mass and right axillary mass.   1. 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 -I  reviewed her first restaging CT scan with her and her husband, and personally reviewed the images with them. It showed very good partial response, with overall about 50% reduction in her disease burden. -We discussed the possibility of breast surgery, if she has complete response in her pulmonary metastasis on the next restaging scan. -continue weekly paclitaxel, trastuzumab and pertuzumab every 3 weeks. I will likely to continue this regiment for 6-9 months. If she has excellent response, then I'll change to Herceptin maintenance therapy. -G1-2 abdominal cramps and diarrhea, likely secondary to Taxol, with increase premed dexamethasone back to 20 mg. She also had mild reaction to Herceptin or pertuzumab, on solu-medro 39m iv as premeds.  -She had normal echo at baseline, we'll repeat her echo  every 3 months when she is on dual anti-HER-2 therapy, next echo due in 2 weeks.  2. Skin rashes on face and scalp, likely related to antibody therapy  -The rash on upper chest much improved with topical steroids - I told her to use topical steroids once on face and clindamycin gel twice on  face.   Plan -continue chemo today -increase dexa to 20 mg before chemo -RTC in 3 weeks  -Echo before next visit   I spent about 30 minutes counseling the patient and her family members, total care was about 40 minutes.  FTruitt Merle 10/05/2014

## 2014-10-05 NOTE — Telephone Encounter (Signed)
Pt confirmed labs/ov per 02/23 POF, gave pt AVS... KJ, sent msg to add chemo

## 2014-10-05 NOTE — Telephone Encounter (Signed)
Per staff message and POF I have scheduled appts. Advised scheduler of appts. JMW  

## 2014-10-05 NOTE — Patient Instructions (Signed)
[facility] Discharge Instructions for Patients Receiving Chemotherapy  Today you received the following chemotherapy agents Herceptin, Perjeta and Taxol.  To help prevent nausea and vomiting after your treatment, we encourage you to take your nausea medication as prescribed.   If you develop nausea and vomiting that is not controlled by your nausea medication, call the clinic.   BELOW ARE SYMPTOMS THAT SHOULD BE REPORTED IMMEDIATELY:  *FEVER GREATER THAN 100.5 F  *CHILLS WITH OR WITHOUT FEVER  NAUSEA AND VOMITING THAT IS NOT CONTROLLED WITH YOUR NAUSEA MEDICATION  *UNUSUAL SHORTNESS OF BREATH  *UNUSUAL BRUISING OR BLEEDING  TENDERNESS IN MOUTH AND THROAT WITH OR WITHOUT PRESENCE OF ULCERS  *URINARY PROBLEMS  *BOWEL PROBLEMS  UNUSUAL RASH Items with * indicate a potential emergency and should be followed up as soon as possible.  Feel free to call the clinic you have any questions or concerns. The clinic phone number is [phone].

## 2014-10-06 ENCOUNTER — Telehealth: Payer: Self-pay | Admitting: Hematology

## 2014-10-06 NOTE — Telephone Encounter (Signed)
sent msg to [facility] D. for echo

## 2014-10-06 NOTE — Telephone Encounter (Signed)
s.w. pt and advised on 3.8 echo....pt ok and aware

## 2014-10-12 ENCOUNTER — Ambulatory Visit (HOSPITAL_BASED_OUTPATIENT_CLINIC_OR_DEPARTMENT_OTHER): Payer: BLUE CROSS/BLUE SHIELD

## 2014-10-12 ENCOUNTER — Other Ambulatory Visit (HOSPITAL_BASED_OUTPATIENT_CLINIC_OR_DEPARTMENT_OTHER): Payer: BLUE CROSS/BLUE SHIELD

## 2014-10-12 ENCOUNTER — Ambulatory Visit: Payer: BLUE CROSS/BLUE SHIELD | Admitting: Nutrition

## 2014-10-12 ENCOUNTER — Ambulatory Visit: Payer: BLUE CROSS/BLUE SHIELD

## 2014-10-12 VITALS — BP 157/84 | HR 102 | Temp 99.1°F | Resp 18

## 2014-10-12 DIAGNOSIS — C7801 Secondary malignant neoplasm of right lung: Secondary | ICD-10-CM

## 2014-10-12 DIAGNOSIS — Z95828 Presence of other vascular implants and grafts: Secondary | ICD-10-CM

## 2014-10-12 DIAGNOSIS — C7800 Secondary malignant neoplasm of unspecified lung: Principal | ICD-10-CM

## 2014-10-12 DIAGNOSIS — C50911 Malignant neoplasm of unspecified site of right female breast: Secondary | ICD-10-CM

## 2014-10-12 DIAGNOSIS — C50311 Malignant neoplasm of lower-inner quadrant of right female breast: Secondary | ICD-10-CM

## 2014-10-12 DIAGNOSIS — Z5111 Encounter for antineoplastic chemotherapy: Secondary | ICD-10-CM

## 2014-10-12 LAB — COMPREHENSIVE METABOLIC PANEL (CC13)
ALT: 28 U/L (ref 0–55)
ANION GAP: 10 meq/L (ref 3–11)
AST: 19 U/L (ref 5–34)
Albumin: 3.6 g/dL (ref 3.5–5.0)
Alkaline Phosphatase: 71 U/L (ref 40–150)
BUN: 12 mg/dL (ref 7.0–26.0)
CHLORIDE: 104 meq/L (ref 98–109)
CO2: 27 meq/L (ref 22–29)
Calcium: 9.3 mg/dL (ref 8.4–10.4)
Creatinine: 0.8 mg/dL (ref 0.6–1.1)
EGFR: >90 mL/min/{1.73_m2} (ref >90)
Glucose: 106 mg/dl (ref 70–140)
Potassium: 4.1 mEq/L (ref 3.5–5.1)
SODIUM: 141 meq/L (ref 136–145)
TOTAL PROTEIN: 6.9 g/dL (ref 6.4–8.3)
Total Bilirubin: 0.44 mg/dL (ref 0.20–1.20)

## 2014-10-12 LAB — CBC WITH DIFFERENTIAL/PLATELET
BASO%: 0.9 % (ref 0.0–2.0)
BASOS ABS: 0.1 10*3/uL (ref 0.0–0.1)
EOS%: 3 % (ref 0.0–7.0)
Eosinophils Absolute: 0.2 10*3/uL (ref 0.0–0.5)
HEMATOCRIT: 31.9 % — AB (ref 34.8–46.6)
HEMOGLOBIN: 10.4 g/dL — AB (ref 11.6–15.9)
LYMPH#: 2.1 10*3/uL (ref 0.9–3.3)
LYMPH%: 35.2 % (ref 14.0–49.7)
MCH: 28.6 pg (ref 25.1–34.0)
MCHC: 32.5 g/dL (ref 31.5–36.0)
MCV: 87.9 fL (ref 79.5–101.0)
MONO#: 0.4 10*3/uL (ref 0.1–0.9)
MONO%: 7.3 % (ref 0.0–14.0)
NEUT#: 3.2 10*3/uL (ref 1.5–6.5)
NEUT%: 53.6 % (ref 38.4–76.8)
Platelets: 344 10*3/uL (ref 145–400)
RBC: 3.62 10*6/uL — ABNORMAL LOW (ref 3.70–5.45)
RDW: 16.7 % — ABNORMAL HIGH (ref 11.2–14.5)
WBC: 5.9 10*3/uL (ref 3.9–10.3)

## 2014-10-12 MED ORDER — PACLITAXEL CHEMO INJECTION 300 MG/50ML
80.0000 mg/m2 | Freq: Once | INTRAVENOUS | Status: AC
  Administered 2014-10-12: 174 mg via INTRAVENOUS
  Filled 2014-10-12: qty 29

## 2014-10-12 MED ORDER — SODIUM CHLORIDE 0.9 % IJ SOLN
10.0000 mL | INTRAMUSCULAR | Status: DC | PRN
  Administered 2014-10-12: 10 mL via INTRAVENOUS
  Filled 2014-10-12: qty 10

## 2014-10-12 MED ORDER — FAMOTIDINE IN NACL 20-0.9 MG/50ML-% IV SOLN
20.0000 mg | Freq: Once | INTRAVENOUS | Status: AC
  Administered 2014-10-12: 20 mg via INTRAVENOUS

## 2014-10-12 MED ORDER — DIPHENHYDRAMINE HCL 50 MG/ML IJ SOLN
50.0000 mg | Freq: Once | INTRAMUSCULAR | Status: AC
  Administered 2014-10-12: 50 mg via INTRAVENOUS

## 2014-10-12 MED ORDER — ONDANSETRON 8 MG/50ML IVPB (CHCC)
8.0000 mg | Freq: Once | INTRAVENOUS | Status: AC
  Administered 2014-10-12: 8 mg via INTRAVENOUS

## 2014-10-12 MED ORDER — DIPHENHYDRAMINE HCL 50 MG/ML IJ SOLN
INTRAMUSCULAR | Status: AC
  Filled 2014-10-12: qty 1

## 2014-10-12 MED ORDER — HEPARIN SOD (PORK) LOCK FLUSH 100 UNIT/ML IV SOLN
500.0000 [IU] | Freq: Once | INTRAVENOUS | Status: AC | PRN
  Administered 2014-10-12: 500 [IU]
  Filled 2014-10-12: qty 5

## 2014-10-12 MED ORDER — ONDANSETRON 8 MG/NS 50 ML IVPB
INTRAVENOUS | Status: AC
  Filled 2014-10-12: qty 8

## 2014-10-12 MED ORDER — DEXAMETHASONE SODIUM PHOSPHATE 20 MG/5ML IJ SOLN
20.0000 mg | Freq: Once | INTRAMUSCULAR | Status: AC
  Administered 2014-10-12: 20 mg via INTRAVENOUS

## 2014-10-12 MED ORDER — SODIUM CHLORIDE 0.9 % IJ SOLN
10.0000 mL | INTRAMUSCULAR | Status: DC | PRN
  Administered 2014-10-12: 10 mL
  Filled 2014-10-12: qty 10

## 2014-10-12 MED ORDER — DEXAMETHASONE SODIUM PHOSPHATE 20 MG/5ML IJ SOLN
INTRAMUSCULAR | Status: AC
  Filled 2014-10-12: qty 5

## 2014-10-12 MED ORDER — FAMOTIDINE IN NACL 20-0.9 MG/50ML-% IV SOLN
INTRAVENOUS | Status: AC
  Filled 2014-10-12: qty 50

## 2014-10-12 MED ORDER — SODIUM CHLORIDE 0.9 % IV SOLN
Freq: Once | INTRAVENOUS | Status: AC
  Administered 2014-10-12: 16:00:00 via INTRAVENOUS

## 2014-10-12 NOTE — Progress Notes (Signed)
Nutrition follow-up completed with patient. Patient is being treated for breast cancer. Weight slightly decreased and documented as 208.1 pounds February 23 down slightly from 209 pounds. Patient reports diarrhea has improved.  She has been taking Imodium as directed. Patient is following low fiber diet.  Nutrition diagnosis: Unintended weight loss continues but has improved.  Intervention:  Educated patient to continue low fiber diet when exhibiting signs of diarrhea. Patient should continue ensure complete once daily.  Provided coupons. Teach back method used.  Monitoring, evaluation, goals: Patient will work to increase calories and protein to minimize weight loss.  Next visit: Tuesday, March 15, during chemotherapy.  **Disclaimer: This note was dictated with voice recognition software. Similar sounding words can inadvertently be transcribed and this note may contain transcription errors which may not have been corrected upon publication of note.**

## 2014-10-12 NOTE — Patient Instructions (Signed)
[facility] Discharge Instructions for Patients Receiving Chemotherapy  Today you received the following chemotherapy agents: Taxol.  To help prevent nausea and vomiting after your treatment, we encourage you to take your nausea medication as prescribed.   If you develop nausea and vomiting that is not controlled by your nausea medication, call the clinic.   BELOW ARE SYMPTOMS THAT SHOULD BE REPORTED IMMEDIATELY:  *FEVER GREATER THAN 100.5 F  *CHILLS WITH OR WITHOUT FEVER  NAUSEA AND VOMITING THAT IS NOT CONTROLLED WITH YOUR NAUSEA MEDICATION  *UNUSUAL SHORTNESS OF BREATH  *UNUSUAL BRUISING OR BLEEDING  TENDERNESS IN MOUTH AND THROAT WITH OR WITHOUT PRESENCE OF ULCERS  *URINARY PROBLEMS  *BOWEL PROBLEMS  UNUSUAL RASH Items with * indicate a potential emergency and should be followed up as soon as possible.  Feel free to call the clinic you have any questions or concerns. The clinic phone number is [phone].    

## 2014-10-14 ENCOUNTER — Telehealth: Payer: Self-pay | Admitting: *Deleted

## 2014-10-14 NOTE — Telephone Encounter (Signed)
Patient called and requested that her appts be moved to earlier in the day. I have moved her appts to earlier and called her back. Left mesage

## 2014-10-19 ENCOUNTER — Ambulatory Visit (HOSPITAL_COMMUNITY)
Admission: RE | Admit: 2014-10-19 | Discharge: 2014-10-19 | Disposition: A | Discharge status: Home / Self Care | Payer: BLUE CROSS/BLUE SHIELD | Source: Ambulatory Visit | Attending: Hematology | Admitting: Hematology

## 2014-10-19 ENCOUNTER — Ambulatory Visit (HOSPITAL_BASED_OUTPATIENT_CLINIC_OR_DEPARTMENT_OTHER): Payer: BLUE CROSS/BLUE SHIELD

## 2014-10-19 ENCOUNTER — Other Ambulatory Visit (HOSPITAL_BASED_OUTPATIENT_CLINIC_OR_DEPARTMENT_OTHER): Payer: BLUE CROSS/BLUE SHIELD

## 2014-10-19 DIAGNOSIS — C7800 Secondary malignant neoplasm of unspecified lung: Secondary | ICD-10-CM | POA: Diagnosis not present

## 2014-10-19 DIAGNOSIS — C50911 Malignant neoplasm of unspecified site of right female breast: Secondary | ICD-10-CM

## 2014-10-19 DIAGNOSIS — I340 Nonrheumatic mitral (valve) insufficiency: Secondary | ICD-10-CM | POA: Insufficient documentation

## 2014-10-19 DIAGNOSIS — C50511 Malignant neoplasm of lower-outer quadrant of right female breast: Secondary | ICD-10-CM

## 2014-10-19 DIAGNOSIS — C7802 Secondary malignant neoplasm of left lung: Secondary | ICD-10-CM

## 2014-10-19 DIAGNOSIS — Z5111 Encounter for antineoplastic chemotherapy: Secondary | ICD-10-CM

## 2014-10-19 DIAGNOSIS — C3490 Malignant neoplasm of unspecified part of unspecified bronchus or lung: Secondary | ICD-10-CM | POA: Insufficient documentation

## 2014-10-19 DIAGNOSIS — C50311 Malignant neoplasm of lower-inner quadrant of right female breast: Secondary | ICD-10-CM

## 2014-10-19 DIAGNOSIS — C773 Secondary and unspecified malignant neoplasm of axilla and upper limb lymph nodes: Secondary | ICD-10-CM

## 2014-10-19 LAB — CBC WITH DIFFERENTIAL/PLATELET
BASO%: 0.6 % (ref 0.0–2.0)
Basophils Absolute: 0 10*3/uL (ref 0.0–0.1)
EOS%: 1.8 % (ref 0.0–7.0)
Eosinophils Absolute: 0.1 10*3/uL (ref 0.0–0.5)
HEMATOCRIT: 32.9 % — AB (ref 34.8–46.6)
HGB: 10.4 g/dL — ABNORMAL LOW (ref 11.6–15.9)
LYMPH%: 34.5 % (ref 14.0–49.7)
MCH: 28.7 pg (ref 25.1–34.0)
MCHC: 31.7 g/dL (ref 31.5–36.0)
MCV: 90.6 fL (ref 79.5–101.0)
MONO#: 0.4 10*3/uL (ref 0.1–0.9)
MONO%: 7.5 % (ref 0.0–14.0)
NEUT#: 3.2 10*3/uL (ref 1.5–6.5)
NEUT%: 55.6 % (ref 38.4–76.8)
PLATELETS: 326 10*3/uL (ref 145–400)
RBC: 3.63 10*6/uL — ABNORMAL LOW (ref 3.70–5.45)
RDW: 17.5 % — ABNORMAL HIGH (ref 11.2–14.5)
WBC: 5.8 10*3/uL (ref 3.9–10.3)
lymph#: 2 10*3/uL (ref 0.9–3.3)

## 2014-10-19 LAB — COMPREHENSIVE METABOLIC PANEL (CC13)
ALBUMIN: 3.7 g/dL (ref 3.5–5.0)
ALK PHOS: 63 U/L (ref 40–150)
ALT: 28 U/L (ref 0–55)
AST: 20 U/L (ref 5–34)
Anion Gap: 8 mEq/L (ref 3–11)
BILIRUBIN TOTAL: 0.58 mg/dL (ref 0.20–1.20)
BUN: 10.4 mg/dL (ref 7.0–26.0)
CO2: 26 mEq/L (ref 22–29)
Calcium: 9.2 mg/dL (ref 8.4–10.4)
Chloride: 105 mEq/L (ref 98–109)
Creatinine: 0.8 mg/dL (ref 0.6–1.1)
EGFR: >90 mL/min/{1.73_m2} (ref >90)
Glucose: 84 mg/dl (ref 70–140)
POTASSIUM: 4.1 meq/L (ref 3.5–5.1)
SODIUM: 139 meq/L (ref 136–145)
TOTAL PROTEIN: 7 g/dL (ref 6.4–8.3)

## 2014-10-19 MED ORDER — SODIUM CHLORIDE 0.9 % IV SOLN
Freq: Once | INTRAVENOUS | Status: AC
  Administered 2014-10-19: 14:00:00 via INTRAVENOUS

## 2014-10-19 MED ORDER — DIPHENHYDRAMINE HCL 50 MG/ML IJ SOLN
50.0000 mg | Freq: Once | INTRAMUSCULAR | Status: AC
  Administered 2014-10-19: 50 mg via INTRAVENOUS

## 2014-10-19 MED ORDER — PACLITAXEL CHEMO INJECTION 300 MG/50ML
80.0000 mg/m2 | Freq: Once | INTRAVENOUS | Status: AC
  Administered 2014-10-19: 174 mg via INTRAVENOUS
  Filled 2014-10-19: qty 29

## 2014-10-19 MED ORDER — SODIUM CHLORIDE 0.9 % IV SOLN
Freq: Once | INTRAVENOUS | Status: AC
  Administered 2014-10-19: 14:00:00 via INTRAVENOUS
  Filled 2014-10-19: qty 4

## 2014-10-19 MED ORDER — FAMOTIDINE IN NACL 20-0.9 MG/50ML-% IV SOLN
INTRAVENOUS | Status: AC
  Filled 2014-10-19: qty 50

## 2014-10-19 MED ORDER — FAMOTIDINE IN NACL 20-0.9 MG/50ML-% IV SOLN
20.0000 mg | Freq: Once | INTRAVENOUS | Status: AC
  Administered 2014-10-19: 20 mg via INTRAVENOUS

## 2014-10-19 MED ORDER — HEPARIN SOD (PORK) LOCK FLUSH 100 UNIT/ML IV SOLN
500.0000 [IU] | Freq: Once | INTRAVENOUS | Status: AC | PRN
  Administered 2014-10-19: 500 [IU]
  Filled 2014-10-19: qty 5

## 2014-10-19 MED ORDER — SODIUM CHLORIDE 0.9 % IJ SOLN
10.0000 mL | INTRAMUSCULAR | Status: DC | PRN
  Administered 2014-10-19: 10 mL
  Filled 2014-10-19: qty 10

## 2014-10-19 MED ORDER — DIPHENHYDRAMINE HCL 50 MG/ML IJ SOLN
INTRAMUSCULAR | Status: AC
  Filled 2014-10-19: qty 1

## 2014-10-19 NOTE — Progress Notes (Signed)
Dr Burr Medico made aware of pt concerns about intense itching after treatment last Tuesday.  Pt states that she took benedryl and itching was relieved.  Dr Burr Medico states that she increased pt pre meds and if pt has any more issues to make nurse aware.  Pt verbalizes understanding.  Dr Burr Medico also made aware of pt continuing to have diarrhea and appetite at about 75% and pt drinking ensure daily.

## 2014-10-19 NOTE — Patient Instructions (Signed)
[facility] Discharge Instructions for Patients Receiving Chemotherapy  Today you received the following chemotherapy agents: Taxol.  To help prevent nausea and vomiting after your treatment, we encourage you to take your nausea medication as prescribed.   If you develop nausea and vomiting that is not controlled by your nausea medication, call the clinic.   BELOW ARE SYMPTOMS THAT SHOULD BE REPORTED IMMEDIATELY:  *FEVER GREATER THAN 100.5 F  *CHILLS WITH OR WITHOUT FEVER  NAUSEA AND VOMITING THAT IS NOT CONTROLLED WITH YOUR NAUSEA MEDICATION  *UNUSUAL SHORTNESS OF BREATH  *UNUSUAL BRUISING OR BLEEDING  TENDERNESS IN MOUTH AND THROAT WITH OR WITHOUT PRESENCE OF ULCERS  *URINARY PROBLEMS  *BOWEL PROBLEMS  UNUSUAL RASH Items with * indicate a potential emergency and should be followed up as soon as possible.  Feel free to call the clinic you have any questions or concerns. The clinic phone number is [phone].    

## 2014-10-19 NOTE — Progress Notes (Signed)
  Echocardiogram 2D Echocardiogram has been performed.  Donata Clay 10/19/2014, 12:08 PM

## 2014-10-26 ENCOUNTER — Ambulatory Visit: Payer: BLUE CROSS/BLUE SHIELD

## 2014-10-26 ENCOUNTER — Other Ambulatory Visit: Payer: BLUE CROSS/BLUE SHIELD

## 2014-10-26 ENCOUNTER — Other Ambulatory Visit (HOSPITAL_BASED_OUTPATIENT_CLINIC_OR_DEPARTMENT_OTHER): Payer: BLUE CROSS/BLUE SHIELD

## 2014-10-26 ENCOUNTER — Ambulatory Visit (HOSPITAL_BASED_OUTPATIENT_CLINIC_OR_DEPARTMENT_OTHER): Payer: BLUE CROSS/BLUE SHIELD

## 2014-10-26 ENCOUNTER — Ambulatory Visit: Payer: BLUE CROSS/BLUE SHIELD | Admitting: Nutrition

## 2014-10-26 ENCOUNTER — Encounter: Payer: Self-pay | Admitting: Hematology

## 2014-10-26 ENCOUNTER — Telehealth: Payer: Self-pay | Admitting: *Deleted

## 2014-10-26 ENCOUNTER — Ambulatory Visit (HOSPITAL_BASED_OUTPATIENT_CLINIC_OR_DEPARTMENT_OTHER): Payer: BLUE CROSS/BLUE SHIELD | Admitting: Hematology

## 2014-10-26 VITALS — Wt 209.6 lb

## 2014-10-26 DIAGNOSIS — C50911 Malignant neoplasm of unspecified site of right female breast: Secondary | ICD-10-CM

## 2014-10-26 DIAGNOSIS — Z5111 Encounter for antineoplastic chemotherapy: Secondary | ICD-10-CM

## 2014-10-26 DIAGNOSIS — Z5112 Encounter for antineoplastic immunotherapy: Secondary | ICD-10-CM

## 2014-10-26 DIAGNOSIS — C7800 Secondary malignant neoplasm of unspecified lung: Principal | ICD-10-CM

## 2014-10-26 DIAGNOSIS — C50311 Malignant neoplasm of lower-inner quadrant of right female breast: Secondary | ICD-10-CM

## 2014-10-26 DIAGNOSIS — Z95828 Presence of other vascular implants and grafts: Secondary | ICD-10-CM

## 2014-10-26 DIAGNOSIS — C7802 Secondary malignant neoplasm of left lung: Secondary | ICD-10-CM

## 2014-10-26 DIAGNOSIS — R109 Unspecified abdominal pain: Secondary | ICD-10-CM

## 2014-10-26 DIAGNOSIS — R197 Diarrhea, unspecified: Secondary | ICD-10-CM

## 2014-10-26 DIAGNOSIS — C7801 Secondary malignant neoplasm of right lung: Secondary | ICD-10-CM

## 2014-10-26 DIAGNOSIS — Z171 Estrogen receptor negative status [ER-]: Secondary | ICD-10-CM

## 2014-10-26 DIAGNOSIS — C50211 Malignant neoplasm of upper-inner quadrant of right female breast: Secondary | ICD-10-CM

## 2014-10-26 DIAGNOSIS — G629 Polyneuropathy, unspecified: Secondary | ICD-10-CM

## 2014-10-26 DIAGNOSIS — R21 Rash and other nonspecific skin eruption: Secondary | ICD-10-CM

## 2014-10-26 LAB — CBC WITH DIFFERENTIAL/PLATELET
BASO%: 0.5 % (ref 0.0–2.0)
Basophils Absolute: 0 10*3/uL (ref 0.0–0.1)
EOS ABS: 0.1 10*3/uL (ref 0.0–0.5)
EOS%: 1.9 % (ref 0.0–7.0)
HEMATOCRIT: 30.2 % — AB (ref 34.8–46.6)
HGB: 10 g/dL — ABNORMAL LOW (ref 11.6–15.9)
LYMPH%: 35 % (ref 14.0–49.7)
MCH: 29.9 pg (ref 25.1–34.0)
MCHC: 33.1 g/dL (ref 31.5–36.0)
MCV: 90.1 fL (ref 79.5–101.0)
MONO#: 0.4 10*3/uL (ref 0.1–0.9)
MONO%: 6.8 % (ref 0.0–14.0)
NEUT%: 55.8 % (ref 38.4–76.8)
NEUTROS ABS: 3.3 10*3/uL (ref 1.5–6.5)
PLATELETS: 289 10*3/uL (ref 145–400)
RBC: 3.35 10*6/uL — AB (ref 3.70–5.45)
RDW: 16.6 % — ABNORMAL HIGH (ref 11.2–14.5)
WBC: 5.9 10*3/uL (ref 3.9–10.3)
lymph#: 2.1 10*3/uL (ref 0.9–3.3)

## 2014-10-26 LAB — COMPREHENSIVE METABOLIC PANEL (CC13)
ALT: 27 U/L (ref 0–55)
AST: 19 U/L (ref 5–34)
Albumin: 3.6 g/dL (ref 3.5–5.0)
Alkaline Phosphatase: 62 U/L (ref 40–150)
Anion Gap: 10 mEq/L (ref 3–11)
BUN: 12.4 mg/dL (ref 7.0–26.0)
CALCIUM: 8.8 mg/dL (ref 8.4–10.4)
CHLORIDE: 106 meq/L (ref 98–109)
CO2: 26 mEq/L (ref 22–29)
CREATININE: 0.8 mg/dL (ref 0.6–1.1)
EGFR: >90 mL/min/{1.73_m2} (ref >90)
GLUCOSE: 93 mg/dL (ref 70–140)
Potassium: 3.6 mEq/L (ref 3.5–5.1)
Sodium: 143 mEq/L (ref 136–145)
Total Bilirubin: 0.46 mg/dL (ref 0.20–1.20)
Total Protein: 6.9 g/dL (ref 6.4–8.3)

## 2014-10-26 MED ORDER — METHYLPREDNISOLONE SODIUM SUCC 40 MG IJ SOLR
40.0000 mg | Freq: Once | INTRAMUSCULAR | Status: AC
  Administered 2014-10-26: 40 mg via INTRAVENOUS

## 2014-10-26 MED ORDER — ACETAMINOPHEN 325 MG PO TABS
ORAL_TABLET | ORAL | Status: AC
Start: 2014-10-26 — End: 2014-10-26
  Filled 2014-10-26: qty 2

## 2014-10-26 MED ORDER — SODIUM CHLORIDE 0.9 % IV SOLN
420.0000 mg | Freq: Once | INTRAVENOUS | Status: AC
  Administered 2014-10-26: 420 mg via INTRAVENOUS
  Filled 2014-10-26: qty 14

## 2014-10-26 MED ORDER — METHYLPREDNISOLONE SODIUM SUCC 40 MG IJ SOLR
INTRAMUSCULAR | Status: AC
  Filled 2014-10-26: qty 1

## 2014-10-26 MED ORDER — PACLITAXEL CHEMO INJECTION 300 MG/50ML
80.0000 mg/m2 | Freq: Once | INTRAVENOUS | Status: AC
  Administered 2014-10-26: 174 mg via INTRAVENOUS
  Filled 2014-10-26: qty 29

## 2014-10-26 MED ORDER — DIPHENHYDRAMINE HCL 50 MG/ML IJ SOLN
INTRAMUSCULAR | Status: AC
  Filled 2014-10-26: qty 1

## 2014-10-26 MED ORDER — DIPHENHYDRAMINE HCL 50 MG/ML IJ SOLN
50.0000 mg | Freq: Once | INTRAMUSCULAR | Status: AC
  Administered 2014-10-26: 50 mg via INTRAVENOUS

## 2014-10-26 MED ORDER — FAMOTIDINE IN NACL 20-0.9 MG/50ML-% IV SOLN
INTRAVENOUS | Status: AC
  Filled 2014-10-26: qty 50

## 2014-10-26 MED ORDER — ACETAMINOPHEN 325 MG PO TABS
650.0000 mg | ORAL_TABLET | Freq: Once | ORAL | Status: AC
  Administered 2014-10-26: 650 mg via ORAL

## 2014-10-26 MED ORDER — FAMOTIDINE IN NACL 20-0.9 MG/50ML-% IV SOLN
20.0000 mg | Freq: Once | INTRAVENOUS | Status: AC
Start: 2014-10-26 — End: 2014-10-26
  Administered 2014-10-26: 20 mg via INTRAVENOUS

## 2014-10-26 MED ORDER — HEPARIN SOD (PORK) LOCK FLUSH 100 UNIT/ML IV SOLN
500.0000 [IU] | Freq: Once | INTRAVENOUS | Status: AC | PRN
  Administered 2014-10-26: 500 [IU]
  Filled 2014-10-26: qty 5

## 2014-10-26 MED ORDER — SODIUM CHLORIDE 0.9 % IJ SOLN
10.0000 mL | INTRAMUSCULAR | Status: DC | PRN
  Administered 2014-10-26: 10 mL
  Filled 2014-10-26: qty 10

## 2014-10-26 MED ORDER — SODIUM CHLORIDE 0.9 % IJ SOLN
10.0000 mL | INTRAMUSCULAR | Status: DC | PRN
  Administered 2014-10-26: 10 mL via INTRAVENOUS
  Filled 2014-10-26: qty 10

## 2014-10-26 MED ORDER — SODIUM CHLORIDE 0.9 % IV SOLN
Freq: Once | INTRAVENOUS | Status: AC
  Administered 2014-10-26: 15:00:00 via INTRAVENOUS
  Filled 2014-10-26: qty 4

## 2014-10-26 MED ORDER — SODIUM CHLORIDE 0.9 % IV SOLN
Freq: Once | INTRAVENOUS | Status: AC
  Administered 2014-10-26: 13:00:00 via INTRAVENOUS

## 2014-10-26 MED ORDER — TRASTUZUMAB CHEMO INJECTION 440 MG
6.0000 mg/kg | Freq: Once | INTRAVENOUS | Status: AC
  Administered 2014-10-26: 588 mg via INTRAVENOUS
  Filled 2014-10-26: qty 28

## 2014-10-26 NOTE — Progress Notes (Signed)
Nutrition follow-up completed with patient during infusion. Weight increased slightly and documented as 209 pounds. Patient reports she believes ensure contributes to loose stools. Patient has discontinued this product over the past few days. She continues to take Imodium as directed and follow a low fiber diet.  Nutrition diagnosis: Unintended weight loss improved.  Intervention:  Encouraged patient to continue low fiber diet when exhibiting signs of diarrhea. Patient educated to increase protein containing foods and discontinue ensure complete. Teach back method used.  Monitoring, evaluation, goals: Patient will work to increase calories and protein to minimize weight loss.  Next visit: Tuesday, March 29, during chemotherapy.  **Disclaimer: This note was dictated with voice recognition software. Similar sounding words can inadvertently be transcribed and this note may contain transcription errors which may not have been corrected upon publication of note.**

## 2014-10-26 NOTE — Patient Instructions (Signed)
[facility] Discharge Instructions for Patients Receiving Chemotherapy  Today you received the following chemotherapy agents: herceptin, perjeta, taxol  To help prevent nausea and vomiting after your treatment, we encourage you to take your nausea medication.  Take it as often as prescribed.     If you develop nausea and vomiting that is not controlled by your nausea medication, call the clinic. If it is after clinic hours your family physician or the after hours number for the clinic or go to the Emergency Department.   BELOW ARE SYMPTOMS THAT SHOULD BE REPORTED IMMEDIATELY:  *FEVER GREATER THAN 100.5 F  *CHILLS WITH OR WITHOUT FEVER  NAUSEA AND VOMITING THAT IS NOT CONTROLLED WITH YOUR NAUSEA MEDICATION  *UNUSUAL SHORTNESS OF BREATH  *UNUSUAL BRUISING OR BLEEDING  TENDERNESS IN MOUTH AND THROAT WITH OR WITHOUT PRESENCE OF ULCERS  *URINARY PROBLEMS  *BOWEL PROBLEMS  UNUSUAL RASH Items with * indicate a potential emergency and should be followed up as soon as possible.  Feel free to call the clinic you have any questions or concerns. The clinic phone number is [phone].   I have been informed and understand all the instructions given to me. I know to contact the clinic, my physician, or go to the Emergency Department if any problems should occur. I do not have any questions at this time, but understand that I may call the clinic during office hours   should I have any questions or need assistance in obtaining follow up care.    __________________________________________  _____________  __________ Signature of Patient or Authorized Representative            Date                   Time    __________________________________________ Nurse's Signature

## 2014-10-26 NOTE — Patient Instructions (Signed)

## 2014-10-26 NOTE — Telephone Encounter (Signed)
I have adjusted appt

## 2014-10-26 NOTE — Progress Notes (Signed)
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 MRI breast on 07/16/2014 showed 7cm R breast lesion and 4.4cm r axillary node    07/02/2014 Imaging CT CAP: a 4.7cm mass in LUL lung and a 2.1cm mas in RML, and a small nodule in RUL, suspecious for metastasis     07/09/2014 Initial Diagnosis right IDA with b/l lung lesions, ER-/PR-/HER2+   07/09/2014 Initial Biopsy US guided right breast mass and axillary node biopsy showed IDA, and DCIS, ER-/PR-/HER2+   CURRENT THERAPY: weekly Paclitaxel 59m/m2, trastuzumab and pertuzumab every 3 weeks, started on 08/04/2014  INTERVAL HISTROY   NMeherreturns for follow-up and 13th dose of Taxol. She has been tolerated well overall. She did have a new episode of itching skin on the night of chemotherapy 2 weeks ago, resolved by taking Benadryl. No skin rash. She noticed her abdominal cramps and diarrhea got better after she stopped thinking in sure a week ago. Her appetite is fair, she eats small meals, weight is stable. She has stable mild numbness and tingling on fingers, some mild pain around some of the toenails. No other complaints.  MEDICAL HISTORY:  Past Medical History  Diagnosis Date  . Breast cancer   . Pneumonia     hx of pneumonia 08/2013   . GERD (gastroesophageal reflux disease)    during pregnancy   . Breast cancer     SURGICAL HISTORY: Past Surgical History  Procedure Laterality Date  . Cesarean section    . Essure tubal ligation    . Cholecystectomy    . Wisdom tooth extraction    . Portacath placement Left 07/21/2014    Procedure: INSERTION PORT-A-CATH;  Surgeon: FStark Klein MD;  Location: [facility];  Service: General;  Laterality: Left;    SOCIAL HISTORY: History   Social History  . Marital Status: Married    Spouse Name: N/A    Number of Children:  she has 2 daughters at the age of 89and 140   . Years of Education: N/A   Occupational History  .  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 Smoking status: Never Smoker   . Smokeless tobacco: Not on file  . Alcohol Use: Yes  . Drug Use: No  . Sexual Activity: No    FAMILY HISTORY: Family History  Problem Relation Age of Onset  . Breast cancer Mother 535 . Liver cancer Father 633 . Breast cancer Maternal Aunt 36  . Prostate cancer Maternal Grandfather   . Liver cancer Paternal Grandmother   . Prostate cancer Paternal Grandfather      GENETICS: 08/30/2014 BreastNext panel was negative. 17 genes including BRCA1, BRCA2, were negative for mutations.   ALLERGIES:  is allergic to aspirin and codeine.  MEDICATIONS:  Current Outpatient Prescriptions  Medication Sig Dispense Refill  .  albuterol (PROVENTIL HFA;VENTOLIN HFA) 108 (90 BASE) MCG/ACT inhaler Inhale 2 puffs into the lungs every 4 (four) hours as needed for wheezing or shortness of breath.    Marland Kitchen amoxicillin-clavulanate (AUGMENTIN ES-600) 600-42.9 MG/5ML suspension Take 5 mLs (600 mg total) by mouth 2 (two) times daily. 80 mL 0  . benzonatate (TESSALON) 100 MG capsule Take 1 capsule (100 mg total) by mouth 3 (three) times daily as needed for cough. 21 capsule 0  . Camphor-Eucalyptus-Menthol (VICKS VAPORUB EX) Apply 1 application topically at bedtime as needed (sinus). Applies under nose, throat and on chest.     . cetirizine (ZYRTEC) 10 MG tablet Take 10 mg by mouth daily as needed for allergies.     . clindamycin (CLINDAGEL) 1 % gel Apply topically to affected areas BID. 30 g 3  . guaiFENesin (ROBITUSSIN) 100 MG/5ML liquid Take 200 mg by mouth 3 (three) times daily as needed for cough.    . hydrocortisone 2.5 % cream APPLY TOPICALLY TO THE AFFECTED AREA TWICE DAILY 30 g 0  . HYDROCORTISONE, TOPICAL, 2 % LOTN Apply topically to affected areas BID. 29.6 mL 1  . ibuprofen (ADVIL,MOTRIN) 200 MG tablet Take 400-800 mg by mouth every 6 (six) hours as needed for headache or moderate pain.    Marland Kitchen lidocaine-prilocaine (EMLA) cream Apply 1 application topically as needed. Apply to portacath 1 1/2  -  2  Hours prior to procedures as needed. 30 g 1  . loperamide (IMODIUM A-D) 2 MG tablet Take 1 tablet (2 mg total) by mouth 4 (four) times daily as needed for diarrhea or loose stools. 30 tablet 0  . metaxalone (SKELAXIN) 800 MG tablet Take 800 mg by mouth 3 (three) times daily as needed for muscle spasms.    . Neomycin-Bacitracin-Polymyxin (TRIPLE ANTIBIOTIC EX) Apply 1 application topically 2 (two) times daily as needed (eczema on hands.).    Marland Kitchen ondansetron (ZOFRAN) 8 MG tablet Take 1 tablet (8 mg total) by mouth every 8 (eight) hours as needed for nausea or vomiting. 30 tablet 3  . oxyCODONE-acetaminophen (ROXICET) 5-325 MG per tablet Take 1-2 tablets by mouth every 4 (four) hours as needed for severe pain. 30 tablet 0  . Pseudoeph-Doxylamine-DM-APAP (NYQUIL PO) Take 30 mLs by mouth 2 (two) times daily as needed (cold/allergies).     . zolpidem (AMBIEN) 5 MG tablet Take 1 tablet (5 mg total) by mouth at bedtime as needed for sleep. 20 tablet 0   No current facility-administered medications for this visit.    REVIEW OF SYSTEMS:   Constitutional: Denies fevers, chills or abnormal night sweats, (+) malaise  Eyes: Denies blurriness of vision, double vision or watery eyes Ears, nose, mouth, throat, and face: Denies  mucositis or sore throat Respiratory: (+) productive cough, no dyspnea or wheezes Cardiovascular: Denies palpitation, chest discomfort or lower extremity swelling Gastrointestinal:  Denies nausea, heartburn or change in bowel habits Skin:(+) rashes  Lymphatics: Denies new lymphadenopathy or easy bruising Neurological:Denies numbness, tingling or new weaknesses Behavioral/Psych: Mood is stable, no new changes  All other systems were reviewed with the patient and are negative.  PHYSICAL EXAMINATION: ECOG PERFORMANCE STATUS: 0 - Asymptomatic Reviewed WNL  GENERAL:alert, no distress and comfortable SKIN: skin color, texture, turgor are normal, no rashes or significant lesions EYES: normal, conjunctiva are pink and non-injected, sclera clear OROPHARYNX:no exudate, no erythema and lips, buccal mucosa, and tongue normal  NECK: supple, thyroid normal size, non-tender, without nodularity LYMPH:  no palpable lymphadenopathy in the cervical, axillary or  inguinal LUNGS: clear to auscultation and percussion with normal breathing effort HEART: regular rate & rhythm and no murmurs and no lower extremity edema ABDOMEN:abdomen soft, non-tender and normal bowel sounds Musculoskeletal:no cyanosis of digits and no clubbing  PSYCH: alert & oriented x 3 with fluent speech NEURO: no focal motor/sensory deficits Breasts: Breast inspection showed them to be symmetrical with no skin change or nipple discharge. Palpation of the right breast showed a 3X3.5cm mass in the lower inner quadrant, which is softer than before, and the previously palpable right axillary node is not palpable anymore except some fullness. Left breast  and axilla revealed no obvious mass that I could appreciate. Skin: (+) Skin pigmentation on hands and feet. No new skin rashes.   LABORATORY DATA:  I have reviewed the data as listed CBC Latest Ref Rng 10/26/2014 10/19/2014 10/12/2014  WBC 3.9 - 10.3 10e3/uL 5.9 5.8 5.9  Hemoglobin 11.6 - 15.9  g/dL 10.0(L) 10.4(L) 10.4(L)  Hematocrit 34.8 - 46.6 % 30.2(L) 32.9(L) 31.9(L)  Platelets 145 - 400 10e3/uL 289 326 344    CMP Latest Ref Rng 10/26/2014 10/19/2014 10/12/2014  Glucose 70 - 140 mg/dl 93 84 106  BUN 7.0 - 26.0 mg/dL 12.4 10.4 12.0  Creatinine 0.6 - 1.1 mg/dL 0.8 0.8 0.8  Sodium 136 - 145 mEq/L 143 139 141  Potassium 3.5 - 5.1 mEq/L 3.6 4.1 4.1  CO2 22 - 29 mEq/L 26 26 27   Calcium 8.4 - 10.4 mg/dL 8.8 9.2 9.3  Total Protein 6.4 - 8.3 g/dL 6.9 7.0 6.9  Total Bilirubin 0.20 - 1.20 mg/dL 0.46 0.58 0.44  Alkaline Phos 40 - 150 U/L 62 63 71  AST 5 - 34 U/L 19 20 19   ALT 0 - 55 U/L 27 28 28      PATHOLOGY REPORT 07/09/2014 #1 breast, right needle core biopsy more anterior -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 45)  Lung, biopsy, Left 07/26/2014 - HIGH GRADE CARCINOMA, SEE COMMENT. Microscopic Comment The carcinoma demonstrates the following immunophenotype: TTF-1 - negative expression. Napsin A- negative expression. CK5/6 - focal, moderate expression. estrogen receptor - negative expression. GCDFP- negative expression. The recent breast biopsy demonstrating Her2 amplified high grade invasive mammary carcinoma is noted ([id number]). Although the immunophenotype of the current case is non-sepcific, it strongly argues against primary lung adenocarcinoma. However, on re-review, the morphology of the current case is essentially identical to the primary mammary carcinoma. In lieu of further immunophenotyping, the tumor will be submitted for Her2 testing and the remaining tissue will be reserved for additional ancillary tumor testing. The results of the Her2 testing will be reported in an addendum. The case was discussed with Dr Burr Medico on 07/28/2015 and  07/29/2015 HER2 POSITIVE    RADIOGRAPHIC STUDIES: CT CAP 10/04/2014 IMPRESSION: Interval decrease in the right axillary lymphadenopathy.  Bilateral pulmonary lesions with left hilar lymphadenopathy also markedly decreased in the interval. The left hilar lymphadenopathy has resolved.  3 millimeter nonobstructing right renal stone.    ECHO 12/8 Impressions: - Normal biventricular size and systolic function. Impaired relaxation with normal filling pressures. Mild tricupsid regurgitation with normal RVSP.  ASSESSMENT & PLAN:  44 year old African-American female, premenopausal, without significant past medical history, presented with palpable right breast mass and right axillary mass.   1. 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 -I reviewed her first restaging CT scan with her and her husband, and personally reviewed the images with them. It showed very good partial response, with overall about 50% reduction in her disease burden. -We discussed the possibility of breast surgery, if she has complete response in her pulmonary metastasis on the next restaging scan. -continue weekly paclitaxel, trastuzumab and pertuzumab every 3 weeks. I will likely to continue this regiment for 6-9 months. If she has excellent response, then I'll change to Herceptin maintenance therapy. -G1-2 abdominal cramps and diarrhea, likely secondary to Taxol, improved after increasing premed dexamethasone back to 20 mg. She also had mild reaction to Herceptin or pertuzumab, on solu-medro 57m iv as premeds.  -She had normal echo at baseline, repeated one normal. Will monitor ECHO  every 3 months when she is on dual anti-HER-2 therapy, next echo due on 01/19/2015.  2. Skin rashes on face and scalp, likely related to  antibody therapy  -The rash on upper chest much improved with topical steroids - I told her to use topical steroids once on face and clindamycin gel twice on face.   3. Abdominal cramp and mild diarrhea -Possibly related to Taxol, and all ensure -Improved. -She spoke with dietitian BPamala Hurrytoday.  4. G1 peripheral neuropathy, skin pigmentation, hands and feet -Stable. We'll continue to monitor closely.  Plan -continue chemo today and weekly  -RTC in 3 weeks    I spent about 20 minutes counseling the patient and her family members, total care was about 25 minutes.  FTruitt Merle 10/26/2014

## 2014-11-02 ENCOUNTER — Ambulatory Visit (HOSPITAL_BASED_OUTPATIENT_CLINIC_OR_DEPARTMENT_OTHER): Payer: BLUE CROSS/BLUE SHIELD

## 2014-11-02 ENCOUNTER — Other Ambulatory Visit (HOSPITAL_BASED_OUTPATIENT_CLINIC_OR_DEPARTMENT_OTHER): Payer: BLUE CROSS/BLUE SHIELD

## 2014-11-02 ENCOUNTER — Telehealth: Payer: Self-pay | Admitting: *Deleted

## 2014-11-02 ENCOUNTER — Ambulatory Visit: Payer: BLUE CROSS/BLUE SHIELD

## 2014-11-02 DIAGNOSIS — C50511 Malignant neoplasm of lower-outer quadrant of right female breast: Secondary | ICD-10-CM

## 2014-11-02 DIAGNOSIS — C50311 Malignant neoplasm of lower-inner quadrant of right female breast: Secondary | ICD-10-CM

## 2014-11-02 DIAGNOSIS — Z5111 Encounter for antineoplastic chemotherapy: Secondary | ICD-10-CM | POA: Diagnosis not present

## 2014-11-02 DIAGNOSIS — C7800 Secondary malignant neoplasm of unspecified lung: Principal | ICD-10-CM

## 2014-11-02 DIAGNOSIS — C773 Secondary and unspecified malignant neoplasm of axilla and upper limb lymph nodes: Secondary | ICD-10-CM | POA: Diagnosis not present

## 2014-11-02 DIAGNOSIS — Z95828 Presence of other vascular implants and grafts: Secondary | ICD-10-CM

## 2014-11-02 DIAGNOSIS — C50911 Malignant neoplasm of unspecified site of right female breast: Secondary | ICD-10-CM

## 2014-11-02 LAB — CBC WITH DIFFERENTIAL/PLATELET
BASO%: 0.3 % (ref 0.0–2.0)
Basophils Absolute: 0 10*3/uL (ref 0.0–0.1)
EOS%: 1.4 % (ref 0.0–7.0)
Eosinophils Absolute: 0.1 10*3/uL (ref 0.0–0.5)
HCT: 30.7 % — ABNORMAL LOW (ref 34.8–46.6)
HGB: 10.2 g/dL — ABNORMAL LOW (ref 11.6–15.9)
LYMPH%: 33.2 % (ref 14.0–49.7)
MCH: 30.4 pg (ref 25.1–34.0)
MCHC: 33.2 g/dL (ref 31.5–36.0)
MCV: 91.6 fL (ref 79.5–101.0)
MONO#: 0.5 10*3/uL (ref 0.1–0.9)
MONO%: 7.1 % (ref 0.0–14.0)
NEUT%: 58 % (ref 38.4–76.8)
NEUTROS ABS: 3.8 10*3/uL (ref 1.5–6.5)
Platelets: 301 10*3/uL (ref 145–400)
RBC: 3.35 10*6/uL — AB (ref 3.70–5.45)
RDW: 16.8 % — ABNORMAL HIGH (ref 11.2–14.5)
WBC: 6.6 10*3/uL (ref 3.9–10.3)
lymph#: 2.2 10*3/uL (ref 0.9–3.3)

## 2014-11-02 LAB — COMPREHENSIVE METABOLIC PANEL (CC13)
ALT: 30 U/L (ref 0–55)
ANION GAP: 12 meq/L — AB (ref 3–11)
AST: 19 U/L (ref 5–34)
Albumin: 3.6 g/dL (ref 3.5–5.0)
Alkaline Phosphatase: 64 U/L (ref 40–150)
BUN: 10.6 mg/dL (ref 7.0–26.0)
CO2: 25 meq/L (ref 22–29)
Calcium: 9 mg/dL (ref 8.4–10.4)
Chloride: 105 mEq/L (ref 98–109)
Creatinine: 0.7 mg/dL (ref 0.6–1.1)
EGFR: >90 mL/min/{1.73_m2} (ref >90)
GLUCOSE: 106 mg/dL (ref 70–140)
Potassium: 3.4 mEq/L — ABNORMAL LOW (ref 3.5–5.1)
Sodium: 141 mEq/L (ref 136–145)
Total Bilirubin: 0.45 mg/dL (ref 0.20–1.20)
Total Protein: 7 g/dL (ref 6.4–8.3)

## 2014-11-02 MED ORDER — SODIUM CHLORIDE 0.9 % IV SOLN
Freq: Once | INTRAVENOUS | Status: AC
  Administered 2014-11-02: 13:00:00 via INTRAVENOUS
  Filled 2014-11-02: qty 4

## 2014-11-02 MED ORDER — HEPARIN SOD (PORK) LOCK FLUSH 100 UNIT/ML IV SOLN
500.0000 [IU] | Freq: Once | INTRAVENOUS | Status: AC | PRN
  Administered 2014-11-02: 500 [IU]
  Filled 2014-11-02: qty 5

## 2014-11-02 MED ORDER — SODIUM CHLORIDE 0.9 % IJ SOLN
10.0000 mL | INTRAMUSCULAR | Status: DC | PRN
  Administered 2014-11-02: 10 mL
  Filled 2014-11-02: qty 10

## 2014-11-02 MED ORDER — PACLITAXEL CHEMO INJECTION 300 MG/50ML
80.0000 mg/m2 | Freq: Once | INTRAVENOUS | Status: AC
  Administered 2014-11-02: 174 mg via INTRAVENOUS
  Filled 2014-11-02: qty 29

## 2014-11-02 MED ORDER — SODIUM CHLORIDE 0.9 % IV SOLN
Freq: Once | INTRAVENOUS | Status: AC
  Administered 2014-11-02: 13:00:00 via INTRAVENOUS

## 2014-11-02 MED ORDER — DIPHENHYDRAMINE HCL 50 MG/ML IJ SOLN
INTRAMUSCULAR | Status: AC
  Filled 2014-11-02: qty 1

## 2014-11-02 MED ORDER — SODIUM CHLORIDE 0.9 % IJ SOLN
10.0000 mL | INTRAMUSCULAR | Status: DC | PRN
  Administered 2014-11-02: 10 mL via INTRAVENOUS
  Filled 2014-11-02: qty 10

## 2014-11-02 MED ORDER — DIPHENHYDRAMINE HCL 50 MG/ML IJ SOLN
50.0000 mg | Freq: Once | INTRAMUSCULAR | Status: AC
  Administered 2014-11-02: 50 mg via INTRAVENOUS

## 2014-11-02 MED ORDER — FAMOTIDINE IN NACL 20-0.9 MG/50ML-% IV SOLN
20.0000 mg | Freq: Once | INTRAVENOUS | Status: AC
  Administered 2014-11-02: 20 mg via INTRAVENOUS

## 2014-11-02 MED ORDER — FAMOTIDINE IN NACL 20-0.9 MG/50ML-% IV SOLN
INTRAVENOUS | Status: AC
  Filled 2014-11-02: qty 50

## 2014-11-02 NOTE — Patient Instructions (Signed)
[facility] Discharge Instructions for Patients Receiving Chemotherapy  Today you received the following chemotherapy agents: Taxol.  To help prevent nausea and vomiting after your treatment, we encourage you to take your nausea medication: Zofran 8 mg every 8 hours   If you develop nausea and vomiting that is not controlled by your nausea medication, call the clinic.   BELOW ARE SYMPTOMS THAT SHOULD BE REPORTED IMMEDIATELY:  *FEVER GREATER THAN 100.5 F  *CHILLS WITH OR WITHOUT FEVER  NAUSEA AND VOMITING THAT IS NOT CONTROLLED WITH YOUR NAUSEA MEDICATION  *UNUSUAL SHORTNESS OF BREATH  *UNUSUAL BRUISING OR BLEEDING  TENDERNESS IN MOUTH AND THROAT WITH OR WITHOUT PRESENCE OF ULCERS  *URINARY PROBLEMS  *BOWEL PROBLEMS  UNUSUAL RASH Items with * indicate a potential emergency and should be followed up as soon as possible.  Feel free to call the clinic you have any questions or concerns. The clinic phone number is [phone].  Please show the [facility] at check-in to the Emergency Department and triage nurse.

## 2014-11-02 NOTE — Telephone Encounter (Signed)
Per patient request I have moved appt next week to a later time

## 2014-11-02 NOTE — Patient Instructions (Signed)

## 2014-11-08 ENCOUNTER — Telehealth: Payer: Self-pay | Admitting: Hematology

## 2014-11-08 NOTE — Telephone Encounter (Signed)
Patient left message stating the status of her appointments for Y[facility] and infusion 10/26/14 were a no show. Patient requesting status be reversed due to she did have her infusion and was seen by YF in the infusion room. Checked chart for 10/26/14 office note and infusion record. No show status reversed. Spoke with patient she is aware.

## 2014-11-09 ENCOUNTER — Encounter: Payer: BLUE CROSS/BLUE SHIELD | Admitting: Nutrition

## 2014-11-09 ENCOUNTER — Other Ambulatory Visit (HOSPITAL_BASED_OUTPATIENT_CLINIC_OR_DEPARTMENT_OTHER): Payer: BLUE CROSS/BLUE SHIELD

## 2014-11-09 ENCOUNTER — Ambulatory Visit (HOSPITAL_BASED_OUTPATIENT_CLINIC_OR_DEPARTMENT_OTHER): Payer: BLUE CROSS/BLUE SHIELD

## 2014-11-09 ENCOUNTER — Ambulatory Visit: Payer: BLUE CROSS/BLUE SHIELD

## 2014-11-09 ENCOUNTER — Other Ambulatory Visit: Payer: BLUE CROSS/BLUE SHIELD

## 2014-11-09 DIAGNOSIS — Z95828 Presence of other vascular implants and grafts: Secondary | ICD-10-CM

## 2014-11-09 DIAGNOSIS — C7800 Secondary malignant neoplasm of unspecified lung: Principal | ICD-10-CM

## 2014-11-09 DIAGNOSIS — C773 Secondary and unspecified malignant neoplasm of axilla and upper limb lymph nodes: Secondary | ICD-10-CM

## 2014-11-09 DIAGNOSIS — C50511 Malignant neoplasm of lower-outer quadrant of right female breast: Secondary | ICD-10-CM

## 2014-11-09 DIAGNOSIS — C7802 Secondary malignant neoplasm of left lung: Secondary | ICD-10-CM

## 2014-11-09 DIAGNOSIS — Z5111 Encounter for antineoplastic chemotherapy: Secondary | ICD-10-CM | POA: Diagnosis not present

## 2014-11-09 DIAGNOSIS — C50911 Malignant neoplasm of unspecified site of right female breast: Secondary | ICD-10-CM

## 2014-11-09 DIAGNOSIS — C50311 Malignant neoplasm of lower-inner quadrant of right female breast: Secondary | ICD-10-CM

## 2014-11-09 LAB — CBC WITH DIFFERENTIAL/PLATELET
BASO%: 0.5 % (ref 0.0–2.0)
BASOS ABS: 0 10*3/uL (ref 0.0–0.1)
EOS%: 1.4 % (ref 0.0–7.0)
Eosinophils Absolute: 0.1 10*3/uL (ref 0.0–0.5)
HEMATOCRIT: 31.1 % — AB (ref 34.8–46.6)
HGB: 10.4 g/dL — ABNORMAL LOW (ref 11.6–15.9)
LYMPH%: 36.1 % (ref 14.0–49.7)
MCH: 30.7 pg (ref 25.1–34.0)
MCHC: 33.4 g/dL (ref 31.5–36.0)
MCV: 91.7 fL (ref 79.5–101.0)
MONO#: 0.5 10*3/uL (ref 0.1–0.9)
MONO%: 7.6 % (ref 0.0–14.0)
NEUT#: 3.6 10*3/uL (ref 1.5–6.5)
NEUT%: 54.4 % (ref 38.4–76.8)
Platelets: 305 10*3/uL (ref 145–400)
RBC: 3.39 10*6/uL — AB (ref 3.70–5.45)
RDW: 16.5 % — AB (ref 11.2–14.5)
WBC: 6.6 10*3/uL (ref 3.9–10.3)
lymph#: 2.4 10*3/uL (ref 0.9–3.3)

## 2014-11-09 LAB — COMPREHENSIVE METABOLIC PANEL (CC13)
ALT: 25 U/L (ref 0–55)
AST: 17 U/L (ref 5–34)
Albumin: 3.6 g/dL (ref 3.5–5.0)
Alkaline Phosphatase: 64 U/L (ref 40–150)
Anion Gap: 11 mEq/L (ref 3–11)
BUN: 11.7 mg/dL (ref 7.0–26.0)
CALCIUM: 8.8 mg/dL (ref 8.4–10.4)
CHLORIDE: 106 meq/L (ref 98–109)
CO2: 23 meq/L (ref 22–29)
Creatinine: 0.7 mg/dL (ref 0.6–1.1)
EGFR: >90 mL/min/{1.73_m2} (ref >90)
GLUCOSE: 108 mg/dL (ref 70–140)
Potassium: 3.4 mEq/L — ABNORMAL LOW (ref 3.5–5.1)
Sodium: 140 mEq/L (ref 136–145)
Total Bilirubin: 0.44 mg/dL (ref 0.20–1.20)
Total Protein: 7.1 g/dL (ref 6.4–8.3)

## 2014-11-09 MED ORDER — PACLITAXEL CHEMO INJECTION 300 MG/50ML
80.0000 mg/m2 | Freq: Once | INTRAVENOUS | Status: AC
  Administered 2014-11-09: 174 mg via INTRAVENOUS
  Filled 2014-11-09: qty 29

## 2014-11-09 MED ORDER — DIPHENHYDRAMINE HCL 50 MG/ML IJ SOLN
INTRAMUSCULAR | Status: AC
  Filled 2014-11-09: qty 1

## 2014-11-09 MED ORDER — DIPHENHYDRAMINE HCL 50 MG/ML IJ SOLN
50.0000 mg | Freq: Once | INTRAMUSCULAR | Status: AC
  Administered 2014-11-09: 50 mg via INTRAVENOUS

## 2014-11-09 MED ORDER — SODIUM CHLORIDE 0.9 % IJ SOLN
10.0000 mL | INTRAMUSCULAR | Status: DC | PRN
  Administered 2014-11-09: 10 mL
  Filled 2014-11-09: qty 10

## 2014-11-09 MED ORDER — SODIUM CHLORIDE 0.9 % IV SOLN
Freq: Once | INTRAVENOUS | Status: AC
  Administered 2014-11-09: 14:00:00 via INTRAVENOUS

## 2014-11-09 MED ORDER — FAMOTIDINE IN NACL 20-0.9 MG/50ML-% IV SOLN
INTRAVENOUS | Status: AC
  Filled 2014-11-09: qty 50

## 2014-11-09 MED ORDER — SODIUM CHLORIDE 0.9 % IJ SOLN
10.0000 mL | INTRAMUSCULAR | Status: DC | PRN
  Administered 2014-11-09: 10 mL via INTRAVENOUS
  Filled 2014-11-09: qty 10

## 2014-11-09 MED ORDER — HEPARIN SOD (PORK) LOCK FLUSH 100 UNIT/ML IV SOLN
500.0000 [IU] | Freq: Once | INTRAVENOUS | Status: AC | PRN
  Administered 2014-11-09: 500 [IU]
  Filled 2014-11-09: qty 5

## 2014-11-09 MED ORDER — SODIUM CHLORIDE 0.9 % IV SOLN
Freq: Once | INTRAVENOUS | Status: AC
  Administered 2014-11-09: 14:00:00 via INTRAVENOUS
  Filled 2014-11-09: qty 4

## 2014-11-09 MED ORDER — FAMOTIDINE IN NACL 20-0.9 MG/50ML-% IV SOLN
20.0000 mg | Freq: Once | INTRAVENOUS | Status: AC
  Administered 2014-11-09: 20 mg via INTRAVENOUS

## 2014-11-09 NOTE — Patient Instructions (Signed)

## 2014-11-09 NOTE — Patient Instructions (Addendum)
Hypokalemia Hypokalemia means that the amount of potassium in the blood is lower than normal.Potassium is a chemical, called an electrolyte, that helps regulate the amount of fluid in the body. It also stimulates muscle contraction and helps nerves function properly.Most of the body's potassium is inside of cells, and only a very small amount is in the blood. Because the amount in the blood is so small, minor changes can be life-threatening. CAUSES 1. Antibiotics. 2. Diarrhea or vomiting. 3. Using laxatives too much, which can cause diarrhea. 4. Chronic kidney disease. 5. Water pills (diuretics). 6. Eating disorders (bulimia). 7. Low magnesium level. 8. Sweating a lot. SIGNS AND SYMPTOMS 1. Weakness. 2. Constipation. 3. Fatigue. 4. Muscle cramps. 5. Mental confusion. 6. Skipped heartbeats or irregular heartbeat (palpitations). 7. Tingling or numbness. DIAGNOSIS  Your health care provider can diagnose hypokalemia with blood tests. In addition to checking your potassium level, your health care provider may also check other lab tests. TREATMENT Hypokalemia can be treated with potassium supplements taken by mouth or adjustments in your current medicines. If your potassium level is very low, you may need to get potassium through a vein (IV) and be monitored in the hospital. A diet high in potassium is also helpful. Foods high in potassium are:  Nuts, such as peanuts and pistachios.  Seeds, such as sunflower seeds and pumpkin seeds.  Peas, lentils, and lima beans.  Whole grain and bran cereals and breads.  Fresh fruit and vegetables, such as apricots, avocado, bananas, cantaloupe, kiwi, oranges, tomatoes, asparagus, and potatoes.  Orange and tomato juices.  Red meats.  Fruit yogurt. HOME CARE INSTRUCTIONS  Take all medicines as prescribed by your health care provider.  Maintain a healthy diet by including nutritious food, such as fruits, vegetables, nuts, whole grains, and lean  meats.  If you are taking a laxative, be sure to follow the directions on the label. SEEK MEDICAL CARE IF:  Your weakness gets worse.  You feel your heart pounding or racing.  You are vomiting or having diarrhea.  You are diabetic and having trouble keeping your blood glucose in the normal range. SEEK IMMEDIATE MEDICAL CARE IF:  You have chest pain, shortness of breath, or dizziness.  You are vomiting or having diarrhea for more than 2 days.  You faint. MAKE SURE YOU:   Understand these instructions.  Will watch your condition.  Will get help right away if you are not doing well or get worse. Document Released: 07/30/2005 Document Revised: 05/20/2013 Document Reviewed: 01/30/2013 [facility] Patient Information 2015 [place], [place]. This information is not intended to replace advice given to you by your health care provider. Make sure you discuss any questions you have with your health care provider. [facility]  Discharge Instructions for Patients Receiving Chemotherapy  Today you received the following chemotherapy agent: Taxol   To help prevent nausea and vomiting after your treatment, we encourage you to take your nausea medication as prescribed.    If you develop nausea and vomiting that is not controlled by your nausea medication, call the clinic.   BELOW ARE SYMPTOMS THAT SHOULD BE REPORTED IMMEDIATELY:  *FEVER GREATER THAN 100.5 F  *CHILLS WITH OR WITHOUT FEVER  NAUSEA AND VOMITING THAT IS NOT CONTROLLED WITH YOUR NAUSEA MEDICATION  *UNUSUAL SHORTNESS OF BREATH  *UNUSUAL BRUISING OR BLEEDING  TENDERNESS IN MOUTH AND THROAT WITH OR WITHOUT PRESENCE OF ULCERS  *URINARY PROBLEMS  *BOWEL PROBLEMS  UNUSUAL RASH Items with * indicate a potential emergency and  should be followed up as soon as possible.  Feel free to call the clinic you have any questions or concerns. The clinic phone number is [phone].  Please show the [facility]  at check-in to the Emergency Department and triage nurse.

## 2014-11-09 NOTE — Progress Notes (Signed)
Patient noting PN to hands that occurs more frequently since last treatment with sore fingertips. She is still able to function completely. Hyperpigmentation to hands and feet with slight spread, per patient. Noted to hands, wrists, elbows, and feet. K decreased at 3.4 today, reviewed potassium-rich foods with patient. Dr. Benay Spice notified of the above.   OK to treat per Dr. Benay Spice

## 2014-11-16 ENCOUNTER — Ambulatory Visit (HOSPITAL_BASED_OUTPATIENT_CLINIC_OR_DEPARTMENT_OTHER): Payer: BLUE CROSS/BLUE SHIELD | Admitting: Hematology

## 2014-11-16 ENCOUNTER — Ambulatory Visit (HOSPITAL_BASED_OUTPATIENT_CLINIC_OR_DEPARTMENT_OTHER): Payer: BLUE CROSS/BLUE SHIELD

## 2014-11-16 ENCOUNTER — Ambulatory Visit: Payer: BLUE CROSS/BLUE SHIELD

## 2014-11-16 ENCOUNTER — Other Ambulatory Visit: Payer: BLUE CROSS/BLUE SHIELD

## 2014-11-16 ENCOUNTER — Other Ambulatory Visit (HOSPITAL_BASED_OUTPATIENT_CLINIC_OR_DEPARTMENT_OTHER): Payer: BLUE CROSS/BLUE SHIELD

## 2014-11-16 VITALS — BP 151/71 | HR 92 | Temp 98.8°F | Resp 18 | Ht 70.0 in | Wt 208.5 lb

## 2014-11-16 DIAGNOSIS — Z5111 Encounter for antineoplastic chemotherapy: Secondary | ICD-10-CM | POA: Diagnosis not present

## 2014-11-16 DIAGNOSIS — C50311 Malignant neoplasm of lower-inner quadrant of right female breast: Secondary | ICD-10-CM

## 2014-11-16 DIAGNOSIS — R21 Rash and other nonspecific skin eruption: Secondary | ICD-10-CM

## 2014-11-16 DIAGNOSIS — R109 Unspecified abdominal pain: Secondary | ICD-10-CM

## 2014-11-16 DIAGNOSIS — C7800 Secondary malignant neoplasm of unspecified lung: Principal | ICD-10-CM

## 2014-11-16 DIAGNOSIS — C7802 Secondary malignant neoplasm of left lung: Secondary | ICD-10-CM

## 2014-11-16 DIAGNOSIS — C773 Secondary and unspecified malignant neoplasm of axilla and upper limb lymph nodes: Secondary | ICD-10-CM

## 2014-11-16 DIAGNOSIS — G629 Polyneuropathy, unspecified: Secondary | ICD-10-CM

## 2014-11-16 DIAGNOSIS — Z5112 Encounter for antineoplastic immunotherapy: Secondary | ICD-10-CM | POA: Diagnosis not present

## 2014-11-16 DIAGNOSIS — Z95828 Presence of other vascular implants and grafts: Secondary | ICD-10-CM

## 2014-11-16 DIAGNOSIS — R197 Diarrhea, unspecified: Secondary | ICD-10-CM

## 2014-11-16 DIAGNOSIS — C50911 Malignant neoplasm of unspecified site of right female breast: Secondary | ICD-10-CM

## 2014-11-16 DIAGNOSIS — Z171 Estrogen receptor negative status [ER-]: Secondary | ICD-10-CM

## 2014-11-16 LAB — COMPREHENSIVE METABOLIC PANEL (CC13)
ALBUMIN: 3.6 g/dL (ref 3.5–5.0)
ALK PHOS: 69 U/L (ref 40–150)
ALT: 26 U/L (ref 0–55)
AST: 20 U/L (ref 5–34)
Anion Gap: 8 mEq/L (ref 3–11)
BUN: 10.8 mg/dL (ref 7.0–26.0)
CALCIUM: 8.9 mg/dL (ref 8.4–10.4)
CHLORIDE: 107 meq/L (ref 98–109)
CO2: 26 mEq/L (ref 22–29)
Creatinine: 0.8 mg/dL (ref 0.6–1.1)
GLUCOSE: 93 mg/dL (ref 70–140)
POTASSIUM: 3.9 meq/L (ref 3.5–5.1)
SODIUM: 140 meq/L (ref 136–145)
TOTAL PROTEIN: 6.9 g/dL (ref 6.4–8.3)
Total Bilirubin: 0.43 mg/dL (ref 0.20–1.20)

## 2014-11-16 LAB — CBC WITH DIFFERENTIAL/PLATELET
BASO%: 0.8 % (ref 0.0–2.0)
Basophils Absolute: 0.1 10*3/uL (ref 0.0–0.1)
EOS%: 1.7 % (ref 0.0–7.0)
Eosinophils Absolute: 0.1 10*3/uL (ref 0.0–0.5)
HEMATOCRIT: 30.8 % — AB (ref 34.8–46.6)
HEMOGLOBIN: 10.2 g/dL — AB (ref 11.6–15.9)
LYMPH%: 31.7 % (ref 14.0–49.7)
MCH: 30.3 pg (ref 25.1–34.0)
MCHC: 33.1 g/dL (ref 31.5–36.0)
MCV: 91.8 fL (ref 79.5–101.0)
MONO#: 0.5 10*3/uL (ref 0.1–0.9)
MONO%: 7.6 % (ref 0.0–14.0)
NEUT#: 3.9 10*3/uL (ref 1.5–6.5)
NEUT%: 58.2 % (ref 38.4–76.8)
PLATELETS: 324 10*3/uL (ref 145–400)
RBC: 3.36 10*6/uL — ABNORMAL LOW (ref 3.70–5.45)
RDW: 18.3 % — ABNORMAL HIGH (ref 11.2–14.5)
WBC: 6.6 10*3/uL (ref 3.9–10.3)
lymph#: 2.1 10*3/uL (ref 0.9–3.3)

## 2014-11-16 MED ORDER — FAMOTIDINE IN NACL 20-0.9 MG/50ML-% IV SOLN
20.0000 mg | Freq: Once | INTRAVENOUS | Status: AC
  Administered 2014-11-16: 20 mg via INTRAVENOUS

## 2014-11-16 MED ORDER — TRASTUZUMAB CHEMO INJECTION 440 MG
6.0000 mg/kg | Freq: Once | INTRAVENOUS | Status: AC
  Administered 2014-11-16: 588 mg via INTRAVENOUS
  Filled 2014-11-16: qty 28

## 2014-11-16 MED ORDER — ACETAMINOPHEN 325 MG PO TABS
ORAL_TABLET | ORAL | Status: AC
  Filled 2014-11-16: qty 2

## 2014-11-16 MED ORDER — SODIUM CHLORIDE 0.9 % IV SOLN: Freq: Once | INTRAVENOUS | Status: DC

## 2014-11-16 MED ORDER — HYDROCORTISONE 2.5 % EX CREA: TOPICAL_CREAM | CUTANEOUS | Status: DC

## 2014-11-16 MED ORDER — PANTOPRAZOLE SODIUM 20 MG PO TBEC: 20.0000 mg | DELAYED_RELEASE_TABLET | Freq: Every day | ORAL | Status: DC

## 2014-11-16 MED ORDER — SODIUM CHLORIDE 0.9 % IV SOLN
Freq: Once | INTRAVENOUS | Status: AC
  Administered 2014-11-16: 15:00:00 via INTRAVENOUS
  Filled 2014-11-16: qty 4

## 2014-11-16 MED ORDER — ACETAMINOPHEN 325 MG PO TABS
650.0000 mg | ORAL_TABLET | Freq: Once | ORAL | Status: AC
Start: 2014-11-16 — End: 2014-11-16
  Administered 2014-11-16: 650 mg via ORAL

## 2014-11-16 MED ORDER — DIPHENHYDRAMINE HCL 50 MG/ML IJ SOLN
50.0000 mg | Freq: Once | INTRAMUSCULAR | Status: AC
  Administered 2014-11-16: 50 mg via INTRAVENOUS

## 2014-11-16 MED ORDER — SODIUM CHLORIDE 0.9 % IV SOLN
420.0000 mg | Freq: Once | INTRAVENOUS | Status: AC
  Administered 2014-11-16: 420 mg via INTRAVENOUS
  Filled 2014-11-16: qty 14

## 2014-11-16 MED ORDER — SODIUM CHLORIDE 0.9 % IJ SOLN
10.0000 mL | INTRAMUSCULAR | Status: DC | PRN
  Administered 2014-11-16: 10 mL via INTRAVENOUS
  Filled 2014-11-16: qty 10

## 2014-11-16 MED ORDER — DIPHENHYDRAMINE HCL 50 MG/ML IJ SOLN
INTRAMUSCULAR | Status: AC
  Filled 2014-11-16: qty 1

## 2014-11-16 MED ORDER — METHYLPREDNISOLONE SODIUM SUCC 40 MG IJ SOLR
INTRAMUSCULAR | Status: AC
  Filled 2014-11-16: qty 1

## 2014-11-16 MED ORDER — DEXTROSE 5 % IV SOLN
70.0000 mg/m2 | Freq: Once | INTRAVENOUS | Status: AC
  Administered 2014-11-16: 156 mg via INTRAVENOUS
  Filled 2014-11-16: qty 26

## 2014-11-16 MED ORDER — METHYLPREDNISOLONE SODIUM SUCC 40 MG IJ SOLR
40.0000 mg | Freq: Once | INTRAMUSCULAR | Status: AC
  Administered 2014-11-16: 40 mg via INTRAVENOUS

## 2014-11-16 MED ORDER — SODIUM CHLORIDE 0.9 % IV SOLN
Freq: Once | INTRAVENOUS | Status: AC
  Administered 2014-11-16: 15:00:00 via INTRAVENOUS

## 2014-11-16 MED ORDER — CLINDAMYCIN PHOSPHATE 1 % EX GEL
CUTANEOUS | Status: DC
Start: 2014-11-16 — End: 2016-09-04

## 2014-11-16 MED ORDER — FAMOTIDINE IN NACL 20-0.9 MG/50ML-% IV SOLN
INTRAVENOUS | Status: AC
  Filled 2014-11-16: qty 50

## 2014-11-16 NOTE — Patient Instructions (Signed)

## 2014-11-16 NOTE — Progress Notes (Signed)
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 MRI breast on 07/16/2014 showed 7cm R breast lesion and 4.4cm r axillary node    07/02/2014 Imaging CT CAP: a 4.7cm mass in LUL lung and a 2.1cm mas in RML, and a small nodule in RUL, suspecious for metastasis     07/09/2014 Initial Diagnosis right IDA with b/l lung lesions, ER-/PR-/HER2+   07/09/2014 Initial Biopsy US guided right breast mass and axillary node biopsy showed IDA, and DCIS, ER-/PR-/HER2+   CURRENT THERAPY: weekly Paclitaxel 80mg/m2, trastuzumab and pertuzumab every 3 weeks, started on 08/04/2014  INTERVAL HISTROY   Viviann returns for follow-up and 16th dose of Taxol. She noticed more fatigue after the prior cycle of chemotherapy, and she has she has been off work since last Thursday. She also reports more frequent intermittent abdominal pain after chemotherapy, not related to eating or diet, no significant nausea or vomiting, no diarrhea. She denies fever or chills. She still has sporadic skin rash, and has been using hydrocortisone and clindamycin gel. Her appetite is decent and she is well. No weight loss. She has mild neuropathy on fingers and toes, no impact on her function. She also noticed mild bleeding from the right big toenail bed, and sensitivity on the  fingernails.  MEDICAL HISTORY:  Past Medical History  Diagnosis Date  . Breast cancer   . Pneumonia     hx of pneumonia 08/2013   . GERD (gastroesophageal reflux disease)     during pregnancy   . Breast cancer     SURGICAL HISTORY: Past Surgical History  Procedure Laterality Date  . Cesarean section    . Essure tubal ligation    . Cholecystectomy    . Wisdom tooth extraction    . Portacath placement Left 07/21/2014    Procedure: INSERTION PORT-A-CATH;  Surgeon: Faera Byerly, MD;  Location: [facility];  Service: General;  Laterality: Left;    SOCIAL HISTORY: History   Social History  . Marital Status: Married    Spouse Name: N/A    Number of Children:  she has 2 daughters at the age of 13 and 16.   . Years of Education: N/A   Occupational History  .  works as manager for a call Center center.    Social History Main Topics  . Smoking status: Never Smoker   . Smokeless tobacco: Not on file  . Alcohol Use: Yes  . Drug Use: No  . Sexual Activity: No    FAMILY HISTORY: Family History  Problem Relation Age of Onset  . Breast cancer Mother 51  . Liver cancer Father 60  . Breast cancer Maternal Aunt 36  . Prostate cancer Maternal Grandfather   . Liver cancer Paternal Grandmother   . Prostate cancer Paternal Grandfather      GENETICS: 08/30/2014 BreastNext panel was negative. 17 genes including BRCA1, BRCA2, were   negative for mutations.   ALLERGIES:  is allergic to aspirin and codeine.  MEDICATIONS:  Current Outpatient Prescriptions  Medication Sig Dispense Refill  . albuterol (PROVENTIL HFA;VENTOLIN HFA) 108 (90 BASE) MCG/ACT inhaler Inhale 2 puffs into the lungs every 4 (four) hours as needed for wheezing or shortness of breath.    . benzonatate (TESSALON) 100 MG capsule Take 1 capsule (100 mg total) by mouth 3 (three) times daily as needed for cough. 21 capsule 0  . Camphor-Eucalyptus-Menthol (VICKS VAPORUB EX) Apply 1 application topically at bedtime as needed  (sinus). Applies under nose, throat and on chest.    . cetirizine (ZYRTEC) 10 MG tablet Take 10 mg by mouth daily as needed for allergies.     . clindamycin (CLINDAGEL) 1 % gel Apply topically to affected areas BID. 60 g 3  . guaiFENesin (ROBITUSSIN) 100 MG/5ML liquid Take 200 mg by mouth 3 (three) times daily as needed for cough.    . hydrocortisone 2.5 % cream APPLY TOPICALLY TO THE AFFECTED AREA TWICE DAILY 28 g 0  . HYDROCORTISONE, TOPICAL, 2 % LOTN Apply topically to affected areas BID. 29.6 mL 1  . ibuprofen (ADVIL,MOTRIN) 200 MG tablet Take 400-800 mg by mouth every 6 (six) hours as needed for headache or moderate pain.    . lidocaine-prilocaine (EMLA) cream Apply 1 application topically as needed. Apply to portacath 1 1/2  -  2  Hours prior to procedures as needed. 30 g 1  . loperamide (IMODIUM A-D) 2 MG tablet Take 1 tablet (2 mg total) by mouth 4 (four) times daily as needed for diarrhea or loose stools. 30 tablet 0  . metaxalone (SKELAXIN) 800 MG tablet Take 800 mg by mouth 3 (three) times daily as needed for muscle spasms.    . Neomycin-Bacitracin-Polymyxin (TRIPLE ANTIBIOTIC EX) Apply 1 application topically 2 (two) times daily as needed (eczema on hands.).    . ondansetron (ZOFRAN) 8 MG tablet Take 1 tablet (8 mg total) by mouth every 8 (eight) hours as needed for nausea or vomiting. 30 tablet 3  . oxyCODONE-acetaminophen (ROXICET) 5-325 MG per tablet Take 1-2 tablets by mouth every 4 (four) hours as needed for severe pain. 30 tablet 0  . Pseudoeph-Doxylamine-DM-APAP (NYQUIL PO) Take 30 mLs by mouth 2 (two) times daily as needed (cold/allergies).     . zolpidem (AMBIEN) 5 MG tablet Take 1 tablet (5 mg total) by mouth at bedtime as needed for sleep. 20 tablet 0  . pantoprazole (PROTONIX) 20 MG tablet Take 1 tablet (20 mg total) by mouth daily. 30 tablet 3   No current facility-administered medications for this visit.    REVIEW OF SYSTEMS:   Constitutional: Denies fevers, chills or  abnormal night sweats, (+) malaise  Eyes: Denies blurriness of vision, double vision or watery eyes Ears, nose, mouth, throat, and face: Denies mucositis or sore throat Respiratory: (+) productive cough, no dyspnea or wheezes Cardiovascular: Denies palpitation, chest discomfort or lower extremity swelling Gastrointestinal:  Denies nausea, heartburn or change in bowel habits Skin:(+) rashes  Lymphatics: Denies new lymphadenopathy or easy bruising Neurological:Denies numbness, tingling or new weaknesses Behavioral/Psych: Mood is stable, no new changes  All other systems were reviewed with the patient and are negative.  PHYSICAL EXAMINATION: ECOG PERFORMANCE STATUS: 1 BP 151/71 mmHg  Pulse 92  Temp(Src) 98.8 F (37.1 C) (Oral)  Resp 18  Ht 5' 10" (1.778 m)  Wt 208 lb 8 oz (94.575 kg)  BMI 29.92 kg/m2  SpO2 99% GENERAL:alert, no   distress and comfortable SKIN: skin color, texture, turgor are normal, no rashes or significant lesions EYES: normal, conjunctiva are pink and non-injected, sclera clear OROPHARYNX:no exudate, no erythema and lips, buccal mucosa, and tongue normal  NECK: supple, thyroid normal size, non-tender, without nodularity LYMPH:  no palpable lymphadenopathy in the cervical, axillary or inguinal LUNGS: clear to auscultation and percussion with normal breathing effort HEART: regular rate & rhythm and no murmurs and no lower extremity edema ABDOMEN:abdomen soft, non-tender and normal bowel sounds Musculoskeletal:no cyanosis of digits and no clubbing  PSYCH: alert & oriented x 3 with fluent speech NEURO: no focal motor/sensory deficits Breasts: Breast inspection showed them to be symmetrical with no skin change or nipple discharge. Palpation of the right breast showed a 2.5X2.5cm mass in the lower inner quadrant, which is softer than before, and the previously palpable right axillary node is not palpable anymore except some fullness. Left breast  and axilla revealed no  obvious mass that I could appreciate. Skin: (+) Skin pigmentation on hands and feet.  a few acne-like rash on face and neck. No new skin rashes. (+)  pigmentation on fingernails, and bulging of fingernails.  LABORATORY DATA:  I have reviewed the data as listed CBC Latest Ref Rng 11/16/2014 11/09/2014 11/02/2014  WBC 3.9 - 10.3 10e3/uL 6.6 6.6 6.6  Hemoglobin 11.6 - 15.9 g/dL 10.2(L) 10.4(L) 10.2(L)  Hematocrit 34.8 - 46.6 % 30.8(L) 31.1(L) 30.7(L)  Platelets 145 - 400 10e3/uL 324 305 301    CMP Latest Ref Rng 11/16/2014 11/09/2014 11/02/2014  Glucose 70 - 140 mg/dl 93 108 106  BUN 7.0 - 26.0 mg/dL 10.8 11.7 10.6  Creatinine 0.6 - 1.1 mg/dL 0.8 0.7 0.7  Sodium 136 - 145 mEq/L 140 140 141  Potassium 3.5 - 5.1 mEq/L 3.9 3.4(L) 3.4(L)  CO2 22 - 29 mEq/L 26 23 25  Calcium 8.4 - 10.4 mg/dL 8.9 8.8 9.0  Total Protein 6.4 - 8.3 g/dL 6.9 7.1 7.0  Total Bilirubin 0.20 - 1.20 mg/dL 0.43 0.44 0.45  Alkaline Phos 40 - 150 U/L 69 64 64  AST 5 - 34 U/L 20 17 19  ALT 0 - 55 U/L 26 25 30     PATHOLOGY REPORT 07/09/2014 #1 breast, right needle core biopsy more anterior -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 45)  Lung, biopsy, Left 07/26/2014 - HIGH GRADE CARCINOMA, SEE COMMENT. Microscopic Comment The carcinoma demonstrates the following immunophenotype: TTF-1 - negative expression. Napsin A- negative expression. CK5/6 - focal, moderate expression. estrogen receptor - negative expression. GCDFP- negative expression. The recent breast biopsy demonstrating Her2 amplified high grade invasive mammary carcinoma is noted ([id number]). Although the immunophenotype of the current case is non-sepcific, it strongly argues against primary lung adenocarcinoma. However, on re-review, the  morphology of the current case is essentially identical to the primary mammary carcinoma. In lieu of further immunophenotyping, the tumor will be submitted for Her2 testing and the remaining tissue will be reserved for additional ancillary tumor testing. The results of the Her2 testing will be reported in an addendum. The case was discussed with Dr  on 07/28/2015 and 07/29/2015 HER2 POSITIVE    RADIOGRAPHIC STUDIES: CT CAP 10/04/2014 IMPRESSION: Interval decrease in the right axillary lymphadenopathy.  Bilateral pulmonary lesions with left hilar lymphadenopathy also markedly decreased in the interval. The left hilar   lymphadenopathy has resolved.  3 millimeter nonobstructing right renal stone.    ECHO 10/19/2014 Impressions:  - Normal biventricular size and systolic function. Abnormal relaxation with normal filling pressures.  Normal strain parameteres: Global longitudinal strain: -17.5% Lateral S prime: 12 cm/sec.  ASSESSMENT & PLAN:  44 year old African-American female, premenopausal, without significant past medical history, presented with palpable right breast mass and right axillary mass.   1. 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 -I reviewed her first restaging CT scan with her and her husband, and personally reviewed the images with them. It showed very good partial response, with overall about 50% reduction in her disease burden. -We discussed the possibility of breast surgery, if she has complete response in her pulmonary metastasis on the next restaging scan. -continue weekly paclitaxel, trastuzumab and pertuzumab every 3 weeks. I will likely to continue this regiment for 6-9 months. If she has excellent response, then I'll change to Herceptin maintenance  therapy. -G1-2 abdominal cramps and diarrhea, likely secondary to Taxol, improved after increasing premed dexamethasone back to 20 mg. She also had mild reaction to Herceptin or pertuzumab, on solu-medro 68m iv as premeds.  -She had normal echo at baseline, repeated one normal. Will monitor ECHO  every 3 months when she is on dual anti-HER-2 therapy, next echo due on 01/19/2015. - due to her worsening abdominal pain and neuropathy, I'll decrease her weekly Taxol dosed to 70 mg/m  - watch peripheral neuropathy closely.  - restaging CT scan in May   2. Skin rashes on face and scalp, likely related to antibody therapy  -The rash on upper chest much improved with topical steroids - I told her to use topical steroids once on face and clindamycin gel twice on face.   3. Abdominal  pain and mild diarrhea -Likely related to Taxol  -I give her a prescription of Protonix today  -I'll decrease her Taxol dose.  -Use Imodium as needed for diarrhea   4. G1 peripheral neuropathy, skin pigmentation, hands and feet -Stable. We'll continue to monitor closely.  Plan -continue chemo today and weekly  -RTC in 2 weeks    I spent about 20 minutes counseling the patient and her family members, total care was about 25 minutes.  FTruitt Merle 11/16/2014

## 2014-11-17 ENCOUNTER — Encounter: Payer: Self-pay | Admitting: Hematology

## 2014-11-22 ENCOUNTER — Telehealth: Payer: Self-pay | Admitting: *Deleted

## 2014-11-22 MED ORDER — SULFAMETHOXAZOLE-TRIMETHOPRIM 800-160 MG PO TABS: 1.0000 | ORAL_TABLET | Freq: Two times a day (BID) | ORAL | Status: DC

## 2014-11-22 NOTE — Telephone Encounter (Signed)
Pt returned call & information given & script sent to pt's pharmacy.  Pt appreciated information & expressed understanding.

## 2014-11-22 NOTE — Telephone Encounter (Signed)
Received vm call from pt stating that her nail beds are swelling up & one finger on her left hand is starting to ooze clear drainage & smells horrible.  She is concerned.  Discussed with Dr Burr Medico & printed some info from [facility] regarding nail changes during treatment with Taxanes which suggested using cold packs on hands & feet when getting chemo & apply 15 " before,during & 15" after chemo.  I also suggest soaking with equal parts white vinegar & tap water for 15 " every night.  Dr. Burr Medico states she can also use the clindamycin on her nails & to keep moisturized with fragrance-free creams/oints.  Dr. Burr Medico will give her bactrim DS script to take 1 tab q 12 hours x 7days-  Message left for pt to call back.

## 2014-11-23 ENCOUNTER — Other Ambulatory Visit (HOSPITAL_BASED_OUTPATIENT_CLINIC_OR_DEPARTMENT_OTHER): Payer: BLUE CROSS/BLUE SHIELD

## 2014-11-23 ENCOUNTER — Ambulatory Visit: Payer: BLUE CROSS/BLUE SHIELD

## 2014-11-23 ENCOUNTER — Ambulatory Visit (HOSPITAL_BASED_OUTPATIENT_CLINIC_OR_DEPARTMENT_OTHER): Payer: BLUE CROSS/BLUE SHIELD

## 2014-11-23 DIAGNOSIS — C773 Secondary and unspecified malignant neoplasm of axilla and upper limb lymph nodes: Secondary | ICD-10-CM

## 2014-11-23 DIAGNOSIS — C50911 Malignant neoplasm of unspecified site of right female breast: Secondary | ICD-10-CM

## 2014-11-23 DIAGNOSIS — Z5111 Encounter for antineoplastic chemotherapy: Secondary | ICD-10-CM

## 2014-11-23 DIAGNOSIS — C7802 Secondary malignant neoplasm of left lung: Secondary | ICD-10-CM

## 2014-11-23 DIAGNOSIS — Z95828 Presence of other vascular implants and grafts: Secondary | ICD-10-CM

## 2014-11-23 DIAGNOSIS — C50311 Malignant neoplasm of lower-inner quadrant of right female breast: Secondary | ICD-10-CM

## 2014-11-23 DIAGNOSIS — C7800 Secondary malignant neoplasm of unspecified lung: Principal | ICD-10-CM

## 2014-11-23 LAB — COMPREHENSIVE METABOLIC PANEL (CC13)
ALBUMIN: 3.8 g/dL (ref 3.5–5.0)
ALT: 24 U/L (ref 0–55)
ANION GAP: 7 meq/L (ref 3–11)
AST: 19 U/L (ref 5–34)
Alkaline Phosphatase: 67 U/L (ref 40–150)
BILIRUBIN TOTAL: 0.48 mg/dL (ref 0.20–1.20)
BUN: 10.7 mg/dL (ref 7.0–26.0)
CHLORIDE: 106 meq/L (ref 98–109)
CO2: 28 meq/L (ref 22–29)
Calcium: 9.5 mg/dL (ref 8.4–10.4)
Creatinine: 0.9 mg/dL (ref 0.6–1.1)
GLUCOSE: 99 mg/dL (ref 70–140)
Potassium: 3.7 mEq/L (ref 3.5–5.1)
Sodium: 141 mEq/L (ref 136–145)
Total Protein: 7.1 g/dL (ref 6.4–8.3)

## 2014-11-23 LAB — CBC WITH DIFFERENTIAL/PLATELET
BASO%: 0.8 % (ref 0.0–2.0)
Basophils Absolute: 0.1 10*3/uL (ref 0.0–0.1)
EOS%: 1.4 % (ref 0.0–7.0)
Eosinophils Absolute: 0.1 10*3/uL (ref 0.0–0.5)
HCT: 31.7 % — ABNORMAL LOW (ref 34.8–46.6)
HGB: 10.4 g/dL — ABNORMAL LOW (ref 11.6–15.9)
LYMPH%: 28.9 % (ref 14.0–49.7)
MCH: 30.4 pg (ref 25.1–34.0)
MCHC: 32.9 g/dL (ref 31.5–36.0)
MCV: 92.4 fL (ref 79.5–101.0)
MONO#: 0.6 10*3/uL (ref 0.1–0.9)
MONO%: 7.2 % (ref 0.0–14.0)
NEUT%: 61.7 % (ref 38.4–76.8)
NEUTROS ABS: 4.8 10*3/uL (ref 1.5–6.5)
Platelets: 340 10*3/uL (ref 145–400)
RBC: 3.43 10*6/uL — AB (ref 3.70–5.45)
RDW: 17.4 % — AB (ref 11.2–14.5)
WBC: 7.7 10*3/uL (ref 3.9–10.3)
lymph#: 2.2 10*3/uL (ref 0.9–3.3)

## 2014-11-23 MED ORDER — SODIUM CHLORIDE 0.9 % IJ SOLN
10.0000 mL | INTRAMUSCULAR | Status: DC | PRN
  Administered 2014-11-23: 10 mL via INTRAVENOUS
  Filled 2014-11-23: qty 10

## 2014-11-23 MED ORDER — SODIUM CHLORIDE 0.9 % IV SOLN
Freq: Once | INTRAVENOUS | Status: AC
  Administered 2014-11-23: 12:00:00 via INTRAVENOUS

## 2014-11-23 MED ORDER — PACLITAXEL CHEMO INJECTION 300 MG/50ML
70.0000 mg/m2 | Freq: Once | INTRAVENOUS | Status: AC
  Administered 2014-11-23: 156 mg via INTRAVENOUS
  Filled 2014-11-23: qty 26

## 2014-11-23 MED ORDER — SODIUM CHLORIDE 0.9 % IV SOLN
Freq: Once | INTRAVENOUS | Status: AC
  Administered 2014-11-23: 13:00:00 via INTRAVENOUS
  Filled 2014-11-23: qty 4

## 2014-11-23 MED ORDER — FAMOTIDINE IN NACL 20-0.9 MG/50ML-% IV SOLN
20.0000 mg | Freq: Once | INTRAVENOUS | Status: AC
  Administered 2014-11-23: 20 mg via INTRAVENOUS

## 2014-11-23 MED ORDER — HEPARIN SOD (PORK) LOCK FLUSH 100 UNIT/ML IV SOLN
500.0000 [IU] | Freq: Once | INTRAVENOUS | Status: AC | PRN
  Administered 2014-11-23: 500 [IU]
  Filled 2014-11-23: qty 5

## 2014-11-23 MED ORDER — DIPHENHYDRAMINE HCL 50 MG/ML IJ SOLN
INTRAMUSCULAR | Status: AC
  Filled 2014-11-23: qty 1

## 2014-11-23 MED ORDER — DIPHENHYDRAMINE HCL 50 MG/ML IJ SOLN
50.0000 mg | Freq: Once | INTRAMUSCULAR | Status: AC
  Administered 2014-11-23: 50 mg via INTRAVENOUS

## 2014-11-23 MED ORDER — SODIUM CHLORIDE 0.9 % IJ SOLN
10.0000 mL | INTRAMUSCULAR | Status: DC | PRN
  Administered 2014-11-23: 10 mL
  Filled 2014-11-23: qty 10

## 2014-11-23 NOTE — Progress Notes (Signed)
Pt using rx for nails, doing vinegar water soaks at home. We will give ice for nails while at treatment today.

## 2014-11-23 NOTE — Patient Instructions (Signed)

## 2014-11-23 NOTE — Patient Instructions (Signed)
[facility] Discharge Instructions for Patients Receiving Chemotherapy  Today you received the following chemotherapy agents: Taxol.  To help prevent nausea and vomiting after your treatment, we encourage you to take your nausea medication: Zofran 8 mg every 8 hours   If you develop nausea and vomiting that is not controlled by your nausea medication, call the clinic.   BELOW ARE SYMPTOMS THAT SHOULD BE REPORTED IMMEDIATELY:  *FEVER GREATER THAN 100.5 F  *CHILLS WITH OR WITHOUT FEVER  NAUSEA AND VOMITING THAT IS NOT CONTROLLED WITH YOUR NAUSEA MEDICATION  *UNUSUAL SHORTNESS OF BREATH  *UNUSUAL BRUISING OR BLEEDING  TENDERNESS IN MOUTH AND THROAT WITH OR WITHOUT PRESENCE OF ULCERS  *URINARY PROBLEMS  *BOWEL PROBLEMS  UNUSUAL RASH Items with * indicate a potential emergency and should be followed up as soon as possible.  Feel free to call the clinic you have any questions or concerns. The clinic phone number is [phone].  Please show the [place] at check-in to the Emergency Department and triage nurse.

## 2014-11-25 ENCOUNTER — Telehealth: Payer: Self-pay | Admitting: *Deleted

## 2014-11-25 NOTE — Telephone Encounter (Signed)
Patient called and moved her appts to later in the day. Patient will check my chart

## 2014-11-28 ENCOUNTER — Emergency Department (HOSPITAL_COMMUNITY)
Admission: EM | Admit: 2014-11-28 | Discharge: 2014-11-28 | Disposition: A | Discharge status: Home / Self Care | Payer: BLUE CROSS/BLUE SHIELD | Attending: Emergency Medicine | Admitting: Emergency Medicine

## 2014-11-28 ENCOUNTER — Emergency Department (HOSPITAL_COMMUNITY): Payer: BLUE CROSS/BLUE SHIELD

## 2014-11-28 ENCOUNTER — Encounter (HOSPITAL_COMMUNITY): Payer: Self-pay | Admitting: Emergency Medicine

## 2014-11-28 DIAGNOSIS — K219 Gastro-esophageal reflux disease without esophagitis: Secondary | ICD-10-CM | POA: Diagnosis not present

## 2014-11-28 DIAGNOSIS — Z853 Personal history of malignant neoplasm of breast: Secondary | ICD-10-CM | POA: Diagnosis not present

## 2014-11-28 DIAGNOSIS — Z9221 Personal history of antineoplastic chemotherapy: Secondary | ICD-10-CM | POA: Diagnosis not present

## 2014-11-28 DIAGNOSIS — A419 Sepsis, unspecified organism: Secondary | ICD-10-CM

## 2014-11-28 DIAGNOSIS — Z8701 Personal history of pneumonia (recurrent): Secondary | ICD-10-CM | POA: Diagnosis not present

## 2014-11-28 DIAGNOSIS — Z792 Long term (current) use of antibiotics: Secondary | ICD-10-CM | POA: Insufficient documentation

## 2014-11-28 DIAGNOSIS — R509 Fever, unspecified: Secondary | ICD-10-CM | POA: Insufficient documentation

## 2014-11-28 DIAGNOSIS — Z79899 Other long term (current) drug therapy: Secondary | ICD-10-CM | POA: Diagnosis not present

## 2014-11-28 DIAGNOSIS — Z7952 Long term (current) use of systemic steroids: Secondary | ICD-10-CM | POA: Insufficient documentation

## 2014-11-28 LAB — I-STAT CHEM 8, ED
BUN: 12 mg/dL (ref 6–23)
CALCIUM ION: 1.24 mmol/L — AB (ref 1.12–1.23)
CHLORIDE: 98 mmol/L (ref 96–112)
Creatinine, Ser: 1 mg/dL (ref 0.50–1.10)
GLUCOSE: 107 mg/dL — AB (ref 70–99)
HCT: 34 % — ABNORMAL LOW (ref 36.0–46.0)
Hemoglobin: 11.6 g/dL — ABNORMAL LOW (ref 12.0–15.0)
Potassium: 4.1 mmol/L (ref 3.5–5.1)
Sodium: 136 mmol/L (ref 135–145)
TCO2: 22 mmol/L (ref 0–100)

## 2014-11-28 LAB — COMPREHENSIVE METABOLIC PANEL
ALBUMIN: 3.7 g/dL (ref 3.5–5.2)
ALT: 24 U/L (ref 0–35)
ANION GAP: 5 (ref 5–15)
AST: 20 U/L (ref 0–37)
Alkaline Phosphatase: 56 U/L (ref 39–117)
BILIRUBIN TOTAL: 0.2 mg/dL — AB (ref 0.3–1.2)
BUN: 12 mg/dL (ref 6–23)
CHLORIDE: 102 mmol/L (ref 96–112)
CO2: 25 mmol/L (ref 19–32)
Calcium: 8.7 mg/dL (ref 8.4–10.5)
Creatinine, Ser: 0.83 mg/dL (ref 0.50–1.10)
GFR calc Af Amer: >90 mL/min (ref >90)
GFR calc non Af Amer: 84 mL/min — ABNORMAL LOW (ref >90)
Glucose, Bld: 108 mg/dL — ABNORMAL HIGH (ref 70–99)
Potassium: 4 mmol/L (ref 3.5–5.1)
SODIUM: 132 mmol/L — AB (ref 135–145)
Total Protein: 6.8 g/dL (ref 6.0–8.3)

## 2014-11-28 LAB — CBC WITH DIFFERENTIAL/PLATELET
Basophils Absolute: 0 10*3/uL (ref 0.0–0.1)
Basophils Relative: 1 % (ref 0–1)
Eosinophils Absolute: 0 10*3/uL (ref 0.0–0.7)
Eosinophils Relative: 1 % (ref 0–5)
HEMATOCRIT: 40.9 % (ref 36.0–46.0)
Hemoglobin: 13.5 g/dL (ref 12.0–15.0)
LYMPHS ABS: 1.2 10*3/uL (ref 0.7–4.0)
Lymphocytes Relative: 35 % (ref 12–46)
MCH: 31.3 pg (ref 26.0–34.0)
MCHC: 33 g/dL (ref 30.0–36.0)
MCV: 94.7 fL (ref 78.0–100.0)
Monocytes Absolute: 0.2 10*3/uL (ref 0.1–1.0)
Monocytes Relative: 6 % (ref 3–12)
NEUTROS ABS: 2 10*3/uL (ref 1.7–7.7)
NEUTROS PCT: 57 % (ref 43–77)
PLATELETS: 227 10*3/uL (ref 150–400)
RBC: 4.32 MIL/uL (ref 3.87–5.11)
RDW: 15.4 % (ref 11.5–15.5)
WBC: 3.4 10*3/uL — ABNORMAL LOW (ref 4.0–10.5)

## 2014-11-28 LAB — I-STAT CG4 LACTIC ACID, ED: LACTIC ACID, VENOUS: 0.82 mmol/L (ref 0.5–2.0)

## 2014-11-28 LAB — URINALYSIS, ROUTINE W REFLEX MICROSCOPIC
Bilirubin Urine: NEGATIVE
GLUCOSE, UA: NEGATIVE mg/dL
Hgb urine dipstick: NEGATIVE
Ketones, ur: NEGATIVE mg/dL
Leukocytes, UA: NEGATIVE
NITRITE: NEGATIVE
Protein, ur: NEGATIVE mg/dL
SPECIFIC GRAVITY, URINE: 1.013 (ref 1.005–1.030)
UROBILINOGEN UA: 0.2 mg/dL (ref 0.0–1.0)
pH: 7 (ref 5.0–8.0)

## 2014-11-28 MED ORDER — HEPARIN SOD (PORK) LOCK FLUSH 100 UNIT/ML IV SOLN
500.0000 [IU] | Freq: Once | INTRAVENOUS | Status: AC
  Administered 2014-11-28: 500 [IU]
  Filled 2014-11-28: qty 5

## 2014-11-28 MED ORDER — ACETAMINOPHEN 325 MG PO TABS: 650.0000 mg | ORAL_TABLET | Freq: Four times a day (QID) | ORAL | Status: DC | PRN

## 2014-11-28 NOTE — ED Provider Notes (Signed)
CSN: [id number]     Arrival date & time 11/28/14  2047 History   First MD Initiated Contact with Patient 11/28/14 2052     Chief Complaint  Patient presents with  . Fever  . Breast Cancer     (Consider location/radiation/quality/duration/timing/severity/associated sxs/prior Treatment) HPI Comments: Patient is a 44 year old female with history of breast cancer currently receiving chemotherapy. She started feeling flushed earlier today and checked her temperature and it was 100.5. She denies any specific complaints. There is no productive cough, abdominal pain, dysuria, stiff neck, headache.  Patient is a 44 y.o. female presenting with fever. The history is provided by the patient.  Fever Max temp prior to arrival:  100.6 Temp source:  Oral Severity:  Moderate Onset quality:  Sudden Duration:  12 hours Timing:  Intermittent Progression:  Unchanged Chronicity:  New Relieved by:  Acetaminophen Worsened by:  Nothing tried Associated symptoms: chills   Associated symptoms: no chest pain, no confusion, no cough, no diarrhea, no dysuria, no sore throat and no vomiting   Risk factors: hx of cancer and immunosuppression     Past Medical History  Diagnosis Date  . Breast cancer   . Pneumonia     hx of pneumonia 08/2013   . GERD (gastroesophageal reflux disease)     during pregnancy   . Breast cancer    Past Surgical History  Procedure Laterality Date  . Cesarean section    . Essure tubal ligation    . Cholecystectomy    . Wisdom tooth extraction    . Portacath placement Left 07/21/2014    Procedure: INSERTION PORT-A-CATH;  Surgeon: Stark Klein, MD;  Location: [facility];  Service: General;  Laterality: Left;   Family History  Problem Relation Age of Onset  . Breast cancer Mother 31  . Liver cancer Father   . Breast cancer Maternal Aunt 8  . Prostate cancer Maternal Grandfather 12  . Liver cancer Paternal Grandmother   . Prostate cancer Paternal Grandfather   . Prostate cancer  Maternal Uncle 73  . Breast cancer Maternal Aunt 88  . Prostate cancer Maternal Uncle 68  . Breast cancer Cousin 21    mat first cousin   History  Substance Use Topics  . Smoking status: Never Smoker   . Smokeless tobacco: Never Used  . Alcohol Use: Yes     Comment: 2-3 drinks per week    OB History    No data available     Review of Systems  Constitutional: Positive for fever and chills.  HENT: Negative for sore throat.   Respiratory: Negative for cough.   Cardiovascular: Negative for chest pain.  Gastrointestinal: Negative for vomiting and diarrhea.  Genitourinary: Negative for dysuria.  Psychiatric/Behavioral: Negative for confusion.  All other systems reviewed and are negative.     Allergies  Aspirin and Codeine  Home Medications   Prior to Admission medications   Medication Sig Start Date End Date Taking? Authorizing Provider  albuterol (PROVENTIL HFA;VENTOLIN HFA) 108 (90 BASE) MCG/ACT inhaler Inhale 2 puffs into the lungs every 4 (four) hours as needed for wheezing or shortness of breath.    Historical Provider, MD  benzonatate (TESSALON) 100 MG capsule Take 1 capsule (100 mg total) by mouth 3 (three) times daily as needed for cough. 08/02/14   Domenic Moras, PA-C  Camphor-Eucalyptus-Menthol (VICKS VAPORUB EX) Apply 1 application topically at bedtime as needed (sinus). Applies under nose, throat and on chest.    Historical Provider, MD  cetirizine (ZYRTEC)  10 MG tablet Take 10 mg by mouth daily as needed for allergies.     Historical Provider, MD  clindamycin (CLINDAGEL) 1 % gel Apply topically to affected areas BID. 11/16/14   [name], MD  guaiFENesin (ROBITUSSIN) 100 MG/5ML liquid Take 200 mg by mouth 3 (three) times daily as needed for cough.    Historical Provider, MD  hydrocortisone 2.5 % cream APPLY TOPICALLY TO THE AFFECTED AREA TWICE DAILY 11/16/14   [name], MD  HYDROCORTISONE, TOPICAL, 2 % LOTN Apply topically to affected areas BID. 09/10/14   [name], MD   ibuprofen (ADVIL,MOTRIN) 200 MG tablet Take 400-800 mg by mouth every 6 (six) hours as needed for headache or moderate pain.    Historical Provider, MD  lidocaine-prilocaine (EMLA) cream Apply 1 application topically as needed. Apply to portacath 1 1/2  -  2  Hours prior to procedures as needed. 07/28/14   [name], MD  loperamide (IMODIUM A-D) 2 MG tablet Take 1 tablet (2 mg total) by mouth 4 (four) times daily as needed for diarrhea or loose stools. 09/21/14   [name], MD  metaxalone (SKELAXIN) 800 MG tablet Take 800 mg by mouth 3 (three) times daily as needed for muscle spasms.    Historical Provider, MD  Neomycin-Bacitracin-Polymyxin (TRIPLE ANTIBIOTIC EX) Apply 1 application topically 2 (two) times daily as needed (eczema on hands.).    Historical Provider, MD  ondansetron (ZOFRAN) 8 MG tablet Take 1 tablet (8 mg total) by mouth every 8 (eight) hours as needed for nausea or vomiting. 07/28/14   [name], MD  oxyCODONE-acetaminophen (ROXICET) 5-325 MG per tablet Take 1-2 tablets by mouth every 4 (four) hours as needed for severe pain. 07/21/14   Stark Klein, MD  pantoprazole (PROTONIX) 20 MG tablet Take 1 tablet (20 mg total) by mouth daily. 11/16/14   [name], MD  Pseudoeph-Doxylamine-DM-APAP (NYQUIL PO) Take 30 mLs by mouth 2 (two) times daily as needed (cold/allergies).     Historical Provider, MD  sulfamethoxazole-trimethoprim (BACTRIM DS,SEPTRA DS) 800-160 MG per tablet Take 1 tablet by mouth 2 (two) times daily. Every 12 hours 11/22/14   [name], MD  zolpidem (AMBIEN) 5 MG tablet Take 1 tablet (5 mg total) by mouth at bedtime as needed for sleep. 08/31/14   [name], MD   BP 185/95 mmHg  Pulse 112  Temp(Src) 99.7 F (37.6 C) (Oral)  Resp 20  SpO2 97%  LMP 08/15/2014 Physical Exam  Constitutional: She is oriented to person, place, and time. She appears well-developed and well-nourished. No distress.  HENT:  Head: Normocephalic and atraumatic.  Mouth/Throat: Oropharynx is clear and  moist.  TMs clear bilaterally.  Neck: Normal range of motion. Neck supple.  Cardiovascular: Normal rate and regular rhythm.  Exam reveals no gallop and no friction rub.   No murmur heard. Pulmonary/Chest: Effort normal and breath sounds normal. No respiratory distress. She has no wheezes. She has no rales.  Abdominal: Soft. Bowel sounds are normal. She exhibits no distension. There is no tenderness.  Musculoskeletal: Normal range of motion. She exhibits no edema.  Lymphadenopathy:    She has no cervical adenopathy.  Neurological: She is alert and oriented to person, place, and time.  Skin: Skin is warm and dry. She is not diaphoretic.  Nursing note and vitals reviewed.   ED Course  Procedures (including critical care time) Labs Review Labs Reviewed  CULTURE, BLOOD (ROUTINE X 2)  CULTURE, BLOOD (ROUTINE X 2)  URINE CULTURE  COMPREHENSIVE METABOLIC PANEL  URINALYSIS, ROUTINE W REFLEX MICROSCOPIC  CBC WITH DIFFERENTIAL/PLATELET  I-STAT CG4 LACTIC ACID, ED  I-STAT CHEM 8, ED    Imaging Review No results found.   EKG Interpretation None      MDM   Final diagnoses:  Sepsis    Patient with history of breast cancer currently undergoing chemotherapy. She developed a fever at home to 100.5 with no symptoms that would help to identify a source. Blood cultures, urinalysis, chest x-ray, laboratory studies were obtained. She is not neutropenic and no source for the fever has been identified. This may well be a viral illness. I've discussed these findings with Dr. Whitney Muse from oncology who feels as though the patient is appropriate for discharge and she is not neutropenic. I've advised the patient to take Tylenol as needed for her fever and return as needed if her symptoms significantly worsen or change. Her oncologist will be notified of her visit and she will be followed up by telephone. She also has an appointment for Tuesday which she can keep.    Veryl Speak, MD 11/28/14 [phone]

## 2014-11-28 NOTE — Discharge Instructions (Signed)
Tylenol 1000 mg every 6 hours as needed for fever.  Follow-up with your oncologist as scheduled, and return to the ER if your symptoms significantly worsen or change.  We will call you if your cultures indicate you require further treatment.   Fever, Adult A fever is a higher than normal body temperature. In an adult, an oral temperature around 98.6 F (37 C) is considered normal. A temperature of 100.4 F (38 C) or higher is generally considered a fever. Mild or moderate fevers generally have no long-term effects and often do not require treatment. Extreme fever (greater than or equal to 106 F or 41.1 C) can cause seizures. The sweating that may occur with repeated or prolonged fever may cause dehydration. Elderly people can develop confusion during a fever. A measured temperature can vary with:  Age.  Time of day.  Method of measurement (mouth, underarm, rectal, or ear). The fever is confirmed by taking a temperature with a thermometer. Temperatures can be taken different ways. Some methods are accurate and some are not.  An oral temperature is used most commonly. Electronic thermometers are fast and accurate.  An ear temperature will only be accurate if the thermometer is positioned as recommended by the manufacturer.  A rectal temperature is accurate and done for those adults who have a condition where an oral temperature cannot be taken.  An underarm (axillary) temperature is not accurate and not recommended. Fever is a symptom, not a disease.  CAUSES   Infections commonly cause fever.  Some noninfectious causes for fever include:  Some arthritis conditions.  Some thyroid or adrenal gland conditions.  Some immune system conditions.  Some types of cancer.  A medicine reaction.  High doses of certain street drugs such as methamphetamine.  Dehydration.  Exposure to high outside or room temperatures.  Occasionally, the source of a fever cannot be determined. This  is sometimes called a "fever of unknown origin" (FUO).  Some situations may lead to a temporary rise in body temperature that may go away on its own. Examples are:  Childbirth.  Surgery.  Intense exercise. HOME CARE INSTRUCTIONS   Take appropriate medicines for fever. Follow dosing instructions carefully. If you use acetaminophen to reduce the fever, be careful to avoid taking other medicines that also contain acetaminophen. Do not take aspirin for a fever if you are younger than age 46. There is an association with Reye's syndrome. Reye's syndrome is a rare but potentially deadly disease.  If an infection is present and antibiotics have been prescribed, take them as directed. Finish them even if you start to feel better.  Rest as needed.  Maintain an adequate fluid intake. To prevent dehydration during an illness with prolonged or recurrent fever, you may need to drink extra fluid.Drink enough fluids to keep your urine clear or pale yellow.  Sponging or bathing with room temperature water may help reduce body temperature. Do not use ice water or alcohol sponge baths.  Dress comfortably, but do not over-bundle. SEEK MEDICAL CARE IF:   You are unable to keep fluids down.  You develop vomiting or diarrhea.  You are not feeling at least partly better after 3 days.  You develop new symptoms or problems. SEEK IMMEDIATE MEDICAL CARE IF:   You have shortness of breath or trouble breathing.  You develop excessive weakness.  You are dizzy or you faint.  You are extremely thirsty or you are making little or no urine.  You develop new pain that was  not there before (such as in the head, neck, chest, back, or abdomen).  You have persistent vomiting and diarrhea for more than 1 to 2 days.  You develop a stiff neck or your eyes become sensitive to light.  You develop a skin rash.  You have a fever or persistent symptoms for more than 2 to 3 days.  You have a fever and your  symptoms suddenly get worse. MAKE SURE YOU:   Understand these instructions.  Will watch your condition.  Will get help right away if you are not doing well or get worse. Document Released: 01/23/2001 Document Revised: 12/14/2013 Document Reviewed: 05/31/2011 [facility] Patient Information 2015 [place], [place]. This information is not intended to replace advice given to you by your health care provider. Make sure you discuss any questions you have with your health care provider.

## 2014-11-28 NOTE — ED Notes (Signed)
Pt has is being treated for breast cancer with mets to lungs. Pt has chemo treatment every Tuesday. Pt was placed on antibiotics on monday due to potential infection under toenails. Pt has not taken anything for fever. Temperature currently 99.7, but pt sts "I have been drinking." Pt sts that at home her temperature ranged from 103 to 105. A&Ox4, ambulatory. Pt c/o generalized body aches but no pain. C/o Nausea, no vomiting.

## 2014-11-29 LAB — URINE CULTURE
COLONY COUNT: NO GROWTH
CULTURE: NO GROWTH

## 2014-11-30 ENCOUNTER — Other Ambulatory Visit: Payer: BLUE CROSS/BLUE SHIELD

## 2014-11-30 ENCOUNTER — Other Ambulatory Visit (HOSPITAL_BASED_OUTPATIENT_CLINIC_OR_DEPARTMENT_OTHER): Payer: BLUE CROSS/BLUE SHIELD

## 2014-11-30 ENCOUNTER — Ambulatory Visit (HOSPITAL_BASED_OUTPATIENT_CLINIC_OR_DEPARTMENT_OTHER): Payer: BLUE CROSS/BLUE SHIELD

## 2014-11-30 ENCOUNTER — Encounter: Payer: Self-pay | Admitting: Hematology

## 2014-11-30 ENCOUNTER — Ambulatory Visit: Payer: BLUE CROSS/BLUE SHIELD

## 2014-11-30 ENCOUNTER — Ambulatory Visit (HOSPITAL_BASED_OUTPATIENT_CLINIC_OR_DEPARTMENT_OTHER): Payer: BLUE CROSS/BLUE SHIELD | Admitting: Hematology

## 2014-11-30 VITALS — Temp 99.7°F

## 2014-11-30 VITALS — BP 136/81 | HR 101 | Temp 100.0°F | Resp 20 | Ht 70.0 in | Wt 209.3 lb

## 2014-11-30 DIAGNOSIS — C50511 Malignant neoplasm of lower-outer quadrant of right female breast: Secondary | ICD-10-CM | POA: Diagnosis not present

## 2014-11-30 DIAGNOSIS — R509 Fever, unspecified: Secondary | ICD-10-CM

## 2014-11-30 DIAGNOSIS — Z5111 Encounter for antineoplastic chemotherapy: Secondary | ICD-10-CM | POA: Diagnosis not present

## 2014-11-30 DIAGNOSIS — C773 Secondary and unspecified malignant neoplasm of axilla and upper limb lymph nodes: Secondary | ICD-10-CM

## 2014-11-30 DIAGNOSIS — C50311 Malignant neoplasm of lower-inner quadrant of right female breast: Secondary | ICD-10-CM

## 2014-11-30 DIAGNOSIS — R197 Diarrhea, unspecified: Secondary | ICD-10-CM

## 2014-11-30 DIAGNOSIS — C7802 Secondary malignant neoplasm of left lung: Secondary | ICD-10-CM

## 2014-11-30 DIAGNOSIS — D6481 Anemia due to antineoplastic chemotherapy: Secondary | ICD-10-CM | POA: Diagnosis not present

## 2014-11-30 DIAGNOSIS — C7800 Secondary malignant neoplasm of unspecified lung: Principal | ICD-10-CM

## 2014-11-30 DIAGNOSIS — R109 Unspecified abdominal pain: Secondary | ICD-10-CM

## 2014-11-30 DIAGNOSIS — Z95828 Presence of other vascular implants and grafts: Secondary | ICD-10-CM

## 2014-11-30 DIAGNOSIS — C50911 Malignant neoplasm of unspecified site of right female breast: Secondary | ICD-10-CM

## 2014-11-30 DIAGNOSIS — R21 Rash and other nonspecific skin eruption: Secondary | ICD-10-CM

## 2014-11-30 DIAGNOSIS — G622 Polyneuropathy due to other toxic agents: Secondary | ICD-10-CM

## 2014-11-30 LAB — COMPREHENSIVE METABOLIC PANEL (CC13)
ALBUMIN: 3.6 g/dL (ref 3.5–5.0)
ALK PHOS: 58 U/L (ref 40–150)
ALT: 24 U/L (ref 0–55)
AST: 22 U/L (ref 5–34)
Anion Gap: 12 mEq/L — ABNORMAL HIGH (ref 3–11)
BUN: 11.5 mg/dL (ref 7.0–26.0)
CALCIUM: 9.1 mg/dL (ref 8.4–10.4)
CHLORIDE: 106 meq/L (ref 98–109)
CO2: 20 mEq/L — ABNORMAL LOW (ref 22–29)
Creatinine: 1 mg/dL (ref 0.6–1.1)
EGFR: 83 mL/min/{1.73_m2} — AB (ref >90)
Glucose: 97 mg/dl (ref 70–140)
POTASSIUM: 4.3 meq/L (ref 3.5–5.1)
SODIUM: 138 meq/L (ref 136–145)
TOTAL PROTEIN: 7 g/dL (ref 6.4–8.3)
Total Bilirubin: 0.28 mg/dL (ref 0.20–1.20)

## 2014-11-30 LAB — CBC WITH DIFFERENTIAL/PLATELET
BASO%: 1.6 % (ref 0.0–2.0)
Basophils Absolute: 0.1 10*3/uL (ref 0.0–0.1)
EOS ABS: 0 10*3/uL (ref 0.0–0.5)
EOS%: 1.1 % (ref 0.0–7.0)
HEMATOCRIT: 32.5 % — AB (ref 34.8–46.6)
HGB: 10.6 g/dL — ABNORMAL LOW (ref 11.6–15.9)
LYMPH%: 34.2 % (ref 14.0–49.7)
MCH: 30.8 pg (ref 25.1–34.0)
MCHC: 32.6 g/dL (ref 31.5–36.0)
MCV: 94.5 fL (ref 79.5–101.0)
MONO#: 0.5 10*3/uL (ref 0.1–0.9)
MONO%: 12.5 % (ref 0.0–14.0)
NEUT%: 50.6 % (ref 38.4–76.8)
NEUTROS ABS: 1.9 10*3/uL (ref 1.5–6.5)
PLATELETS: 277 10*3/uL (ref 145–400)
RBC: 3.44 10*6/uL — ABNORMAL LOW (ref 3.70–5.45)
RDW: 15.9 % — ABNORMAL HIGH (ref 11.2–14.5)
WBC: 3.8 10*3/uL — AB (ref 3.9–10.3)
lymph#: 1.3 10*3/uL (ref 0.9–3.3)

## 2014-11-30 MED ORDER — FAMOTIDINE IN NACL 20-0.9 MG/50ML-% IV SOLN
20.0000 mg | Freq: Once | INTRAVENOUS | Status: AC
  Administered 2014-11-30: 20 mg via INTRAVENOUS

## 2014-11-30 MED ORDER — SODIUM CHLORIDE 0.9 % IJ SOLN
10.0000 mL | INTRAMUSCULAR | Status: DC | PRN
  Administered 2014-11-30: 10 mL
  Filled 2014-11-30: qty 10

## 2014-11-30 MED ORDER — SODIUM CHLORIDE 0.9 % IV SOLN
Freq: Once | INTRAVENOUS | Status: AC
  Administered 2014-11-30: 16:00:00 via INTRAVENOUS

## 2014-11-30 MED ORDER — DIPHENHYDRAMINE HCL 50 MG/ML IJ SOLN
50.0000 mg | Freq: Once | INTRAMUSCULAR | Status: AC
Start: 2014-11-30 — End: 2014-11-30
  Administered 2014-11-30: 50 mg via INTRAVENOUS

## 2014-11-30 MED ORDER — SODIUM CHLORIDE 0.9 % IV SOLN
Freq: Once | INTRAVENOUS | Status: AC
  Administered 2014-11-30: 16:00:00 via INTRAVENOUS
  Filled 2014-11-30: qty 4

## 2014-11-30 MED ORDER — SODIUM CHLORIDE 0.9 % IJ SOLN
10.0000 mL | INTRAMUSCULAR | Status: DC | PRN
  Administered 2014-11-30: 10 mL via INTRAVENOUS
  Filled 2014-11-30: qty 10

## 2014-11-30 MED ORDER — PACLITAXEL CHEMO INJECTION 300 MG/50ML
70.0000 mg/m2 | Freq: Once | INTRAVENOUS | Status: AC
  Administered 2014-11-30: 156 mg via INTRAVENOUS
  Filled 2014-11-30: qty 26

## 2014-11-30 MED ORDER — DIPHENHYDRAMINE HCL 50 MG/ML IJ SOLN
INTRAMUSCULAR | Status: AC
  Filled 2014-11-30: qty 1

## 2014-11-30 MED ORDER — FAMOTIDINE IN NACL 20-0.9 MG/50ML-% IV SOLN
INTRAVENOUS | Status: AC
  Filled 2014-11-30: qty 50

## 2014-11-30 MED ORDER — HEPARIN SOD (PORK) LOCK FLUSH 100 UNIT/ML IV SOLN
500.0000 [IU] | Freq: Once | INTRAVENOUS | Status: AC | PRN
Start: 2014-11-30 — End: 2014-11-30
  Administered 2014-11-30: 500 [IU]
  Filled 2014-11-30: qty 5

## 2014-11-30 NOTE — Addendum Note (Signed)
Addended by: [name] on: 11/30/2014 05:32 PM   Modules accepted: Miquel Dunn

## 2014-11-30 NOTE — Patient Instructions (Signed)

## 2014-11-30 NOTE — Progress Notes (Signed)
[facility]  Follow up note   Patient Care Team: [name], DO as PCP - General (Family Medicine) [name], DO (Family Medicine) Holley Bouche, NP as Nurse Practitioner (Nurse Practitioner) Stark Klein, MD as Consulting Physician (General Surgery) Arloa Koh, MD as Consulting Physician (Radiation Oncology) [name], MD as Consulting Physician (Hematology)  CHIEF COMPLAINTS Follow up metastatic breast cancer  Oncology History   Breast cancer 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   Staging form: Breast, AJCC 7th Edition     Clinical stage from 07/22/2014: Stage IV (T3, N1, M1) - Unsigned       Breast cancer 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   07/02/2014 Mammogram Mammogram showed a 2cm right beast mass and a 1.8cm right axillary node. MRI breast on 07/16/2014 showed 7cm R breast lesion and 4.4cm r axillary node    07/02/2014 Imaging CT CAP: a 4.7cm mass in LUL lung and a 2.1cm mas in RML, and a small nodule in RUL, suspecious for metastasis     07/09/2014 Initial Diagnosis right IDA with b/l lung lesions, ER-/PR-/HER2+   07/09/2014 Initial Biopsy US guided right breast mass and axillary node biopsy showed IDA, and DCIS, ER-/PR-/HER2+   07/26/2014 Pathologic Stage Left lung mass by IR, path revealed high grade carcinoma, morphology similar to breast tumor biopsy, TTF(-), NapsinA(-), ER(-)   08/04/2014 -  Chemotherapy weekly Paclitaxel 36m/m2, trastuzumab and pertuzumab every 3 weeks   10/04/2014 Imaging Interval decrease in the right axillary lymphadenopathy. Bilateral pulmonary lesions with left hilar lymphadenopathy also markedly decreased in the interval. The left hilar lymphadenopathy has resolved.    CURRENT THERAPY: weekly Paclitaxel 867mm2, trastuzumab and pertuzumab every 3 weeks, started on 08/04/2014  INTERVAL HISTROY   [name] [name] for follow-up and 18th dose of Taxol. She call last week for worsening nail change, and some discharge from nail bed. I gave her a  course of Bactrim. Her peripheral neuropathy is stable, but it became persistent now. No issues with fingers function. And the tingling does not wake her up at night. She noticed fever with temperature 100.5 at home 2 days ago, and went to emergency room, ID workup was negative. She was discharged home. She still had low-grade fever with temperature 100 yesterday and today in the clinic. But she feels well overall, no productive cough, no diarrhea except at this morning, no pain or other new symptoms. She has not returned to work yet, is still debating if she should go back to work.   MEDICAL HISTORY:  Past Medical History  Diagnosis Date  . Breast cancer   . Pneumonia     hx of pneumonia 08/2013   . GERD (gastroesophageal reflux disease)     during pregnancy   . Breast cancer     SURGICAL HISTORY: Past Surgical History  Procedure Laterality Date  . Cesarean section    . Essure tubal ligation    . Cholecystectomy    . Wisdom tooth extraction    . Portacath placement Left 07/21/2014    Procedure: INSERTION PORT-A-CATH;  Surgeon: FaStark KleinMD;  Location: [facility];  Service: General;  Laterality: Left;    SOCIAL HISTORY: History   Social History  . Marital Status: Married    Spouse Name: N/A    Number of Children:  she has 2 daughters at the age of [age]  . Years of Education: N/A   Occupational History  .  works as maFreight forwarderor a caFilm/video editor   Social  History Main Topics  . Smoking status: Never Smoker   . Smokeless tobacco: Not on file  . Alcohol Use: Yes  . Drug Use: No  . Sexual Activity: No    FAMILY HISTORY: Family History  Problem Relation Age of Onset  . Breast cancer Mother 64  . Liver cancer Father 42  . Breast cancer Maternal Aunt 36  . Prostate cancer Maternal Grandfather   . Liver cancer Paternal Grandmother   . Prostate cancer Paternal Grandfather      GENETICS: 08/30/2014 BreastNext panel was negative. 17 genes including BRCA1, BRCA2,  were negative for mutations.   ALLERGIES:  is allergic to aspirin and codeine.  MEDICATIONS:  Current Outpatient Prescriptions  Medication Sig Dispense Refill  . albuterol (PROVENTIL HFA;VENTOLIN HFA) 108 (90 BASE) MCG/ACT inhaler Inhale 2 puffs into the lungs every 4 (four) hours as needed for wheezing or shortness of breath.    . Camphor-Eucalyptus-Menthol (VICKS VAPORUB EX) Apply 1 application topically at bedtime as needed (sinus). Applies under nose, throat and on chest.    . cetirizine (ZYRTEC) 10 MG tablet Take 10 mg by mouth daily as needed for allergies.     . clindamycin (CLINDAGEL) 1 % gel Apply topically to affected areas BID. 60 g 3  . diphenhydrAMINE (SOMINEX) 25 MG tablet Take 50 mg by mouth at bedtime as needed for sleep.    . hydrocortisone 2.5 % cream APPLY TOPICALLY TO THE AFFECTED AREA TWICE DAILY 28 g 0  . ibuprofen (ADVIL,MOTRIN) 200 MG tablet Take 400-800 mg by mouth every 6 (six) hours as needed for headache or moderate pain.    Marland Kitchen lidocaine-prilocaine (EMLA) cream Apply 1 application topically as needed. Apply to portacath 1 1/2  -  2  Hours prior to procedures as needed. 30 g 1  . loperamide (IMODIUM A-D) 2 MG tablet Take 1 tablet (2 mg total) by mouth 4 (four) times daily as needed for diarrhea or loose stools. 30 tablet 0  . metaxalone (SKELAXIN) 800 MG tablet Take 800 mg by mouth 3 (three) times daily as needed for muscle spasms.    . ondansetron (ZOFRAN) 8 MG tablet Take 1 tablet (8 mg total) by mouth every 8 (eight) hours as needed for nausea or vomiting. 30 tablet 3  . oxyCODONE-acetaminophen (ROXICET) 5-325 MG per tablet Take 1-2 tablets by mouth every 4 (four) hours as needed for severe pain. 30 tablet 0  . pantoprazole (PROTONIX) 20 MG tablet Take 1 tablet (20 mg total) by mouth daily. 30 tablet 3  . guaiFENesin (ROBITUSSIN) 100 MG/5ML liquid Take 200 mg by mouth 3 (three) times daily as needed for cough.    Marland Kitchen HYDROCORTISONE, TOPICAL, 2 % LOTN Apply topically to  affected areas BID. (Patient not taking: Reported on 11/30/2014) 29.6 mL 1  . Pseudoeph-Doxylamine-DM-APAP (NYQUIL PO) Take 30 mLs by mouth 2 (two) times daily as needed (cold/allergies).     . zolpidem (AMBIEN) 5 MG tablet Take 1 tablet (5 mg total) by mouth at bedtime as needed for sleep. (Patient not taking: Reported on 11/28/2014) 20 tablet 0   No current facility-administered medications for this visit.    REVIEW OF SYSTEMS:   Constitutional: Denies fevers, chills or abnormal night sweats, (+) malaise  Eyes: Denies blurriness of vision, double vision or watery eyes Ears, nose, mouth, throat, and face: Denies mucositis or sore throat Respiratory: (+) productive cough, no dyspnea or wheezes Cardiovascular: Denies palpitation, chest discomfort or lower extremity swelling Gastrointestinal:  Denies nausea, heartburn  or change in bowel habits Skin:(+) rashes  Lymphatics: Denies new lymphadenopathy or easy bruising Neurological:Denies numbness, tingling or new weaknesses Behavioral/Psych: Mood is stable, no new changes  All other systems were reviewed with the patient and are negative.  PHYSICAL EXAMINATION: ECOG PERFORMANCE STATUS: 1 BP 136/81 mmHg  Pulse 101  Temp(Src) 100 F (37.8 C) (Oral)  Resp 20  Ht _0  (1.778 m)  Wt 209 lb 4.8 oz (94.938 kg)  BMI 30.03 kg/m2  SpO2 100%  LMP 08/15/2014 GENERAL:alert, no distress and comfortable SKIN: skin color, texture, turgor are normal, no rashes or significant lesions EYES: normal, conjunctiva are pink and non-injected, sclera clear OROPHARYNX:no exudate, no erythema and lips, buccal mucosa, and tongue normal  NECK: supple, thyroid normal size, non-tender, without nodularity LYMPH:  no palpable lymphadenopathy in the cervical, axillary or inguinal LUNGS: clear to auscultation and percussion with normal breathing effort HEART: regular rate & rhythm and no murmurs and no lower extremity edema ABDOMEN:abdomen soft, non-tender and normal  bowel sounds Musculoskeletal:no cyanosis of digits and no clubbing  PSYCH: alert & oriented x 3 with fluent speech NEURO: no focal motor/sensory deficits Breasts: Breast inspection showed them to be symmetrical with no skin change or nipple discharge. Palpation of the right breast showed a 2.0X2.0cm mass in the lower inner quadrant, which is softer than before, and the previously palpable right axillary node is not palpable anymore except some fullness. Left breast  and axilla revealed no obvious mass that I could appreciate. Skin: (+) Skin pigmentation on hands and feet.  a few acne-like rash on face and neck. No new skin rashes. (+)  pigmentation on fingernails, and bulging of fingernails.   LABORATORY DATA:  I have reviewed the data as listed CBC Latest Ref Rng 11/30/2014 11/28/2014 11/28/2014  WBC 3.9 - 10.3 10e3/uL 3.8(L) - 3.4(L)  Hemoglobin 11.6 - 15.9 g/dL 10.6(L) 11.6(L) 13.5  Hematocrit 34.8 - 46.6 % 32.5(L) 34.0(L) 40.9  Platelets 145 - 400 10e3/uL 277 - 227    CMP Latest Ref Rng 11/30/2014 11/28/2014 11/28/2014  Glucose 70 - 140 mg/dl 97 107(H) 108(H)  BUN 7.0 - 26.0 mg/dL 11._1 Creatinine 0.6 - 1.1 mg/dL 1.0 1.00 0.83  Sodium 136 - 145 mEq/L 138 136 132(L)  Potassium 3.5 - 5.1 mEq/L 4.3 4.1 4.0  Chloride 96 - 112 mmol/L - 98 102  CO2 22 - 29 mEq/L 20(L) - 25  Calcium 8.4 - 10.4 mg/dL 9.1 - 8.7  Total Protein 6.4 - 8.3 g/dL 7.0 - 6.8  Total Bilirubin 0.20 - 1.20 mg/dL 0.28 - 0.2(L)  Alkaline Phos 40 - 150 U/L 58 - 56  AST 5 - 34 U/L 22 - 20  ALT 0 - 55 U/L 24 - 24     PATHOLOGY REPORT 07/09/2014 #1 breast, right needle core biopsy more anterior -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 45)  Lung, biopsy, Left 07/26/2014 - HIGH GRADE CARCINOMA,  SEE COMMENT. Microscopic Comment The carcinoma demonstrates the following immunophenotype: TTF-1 - negative expression. Napsin A- negative expression. CK5/6 - focal, moderate expression. estrogen receptor - negative expression. GCDFP- negative expression. The recent breast biopsy demonstrating Her2 amplified high grade invasive mammary carcinoma is noted ([id number]). Although the immunophenotype of the current case is non-sepcific, it strongly argues against primary lung adenocarcinoma. However,  on re-review, the morphology of the current case is essentially identical to the primary mammary carcinoma. In lieu of further immunophenotyping, the tumor will be submitted for Her2 testing and the remaining tissue will be reserved for additional ancillary tumor testing. The results of the Her2 testing will be reported in an addendum. The case was discussed with Dr Burr Medico on 07/28/2015 and 07/29/2015 HER2 POSITIVE    RADIOGRAPHIC STUDIES: CT CAP 10/04/2014 IMPRESSION: Interval decrease in the right axillary lymphadenopathy.  Bilateral pulmonary lesions with left hilar lymphadenopathy also markedly decreased in the interval. The left hilar lymphadenopathy has resolved.  3 millimeter nonobstructing right renal stone.    ECHO 10/19/2014 Impressions:  - Normal biventricular size and systolic function. Abnormal relaxation with normal filling pressures.  Normal strain parameteres: Global longitudinal strain: -17.5% Lateral S prime: 12 cm/sec.  ASSESSMENT & PLAN:  44 year old African-American female, premenopausal, without significant past medical history, presented with palpable right breast mass and right axillary mass.   1. 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 -I reviewed her  first restaging CT scan with her and her husband, and personally reviewed the images with them. It showed very good partial response, with overall about 50% reduction in her disease burden. -We discussed the possibility of breast surgery, if she has complete response in her pulmonary metastasis on the next restaging scan. -continue weekly paclitaxel, trastuzumab and pertuzumab every 3 weeks. I will likely to continue this regiment for 6-9 months. If she has excellent response, then I'll change to Herceptin maintenance therapy. -G1-2 abdominal cramps and diarrhea, likely secondary to Taxol, improved after increasing premed dexamethasone back to 20 mg. She also had mild reaction to Herceptin or pertuzumab, on solu-medro 77m iv as premeds.  -She had normal echo at baseline, repeated one normal. Will monitor ECHO  every 3 months when she is on dual anti-HER-2 therapy, next echo due on 01/19/2015. - due to her worsening abdominal pain and neuropathy, I'll decrease her weekly Taxol dosed to 70 mg/m , and she has been tolerating it well  - watch peripheral neuropathy closely.  - restaging CT scan in 3 weeks   2. Skin rashes on face and scalp, likely related to antibody therapy  -The rash on upper chest much improved with topical steroids - I told her to use topical steroids once on face and clindamycin gel twice on face.   3. Nail changes -secondary to Taxol. -continue warm water) of the neck or soaking once daily, holding ice bag he hands during chemotherapy -she finished a course of Bactrim last week  4. Low-grade fever -Her chest x-ray, urine and percussion or -2 days ago. -Could be virus or allergy reaction -Continue observation   5. Abdominal  pain and mild diarrhea -Likely related to Taxol  -I give her a prescription of Protonix today  -Improved afterTaxol dose reduction.  -Use Imodium as needed for diarrhea   6. G1 peripheral neuropathy, skin pigmentation, hands and feet -Stable. We'll  continue to monitor closely.  7. Anemia, secondary to chemotherapy -Stable. No need for transfusion.  Plan -continue chemo today and weekly  -RTC in 3 weeks  -CT chest abdomen and pelvis with IV contrast before next visit   I spent about 20 minutes counseling the patient and her family members, total care was about 25 minutes.  [name]  11/30/2014

## 2014-11-30 NOTE — Patient Instructions (Signed)
[facility] Discharge Instructions for Patients Receiving Chemotherapy  Today you received the following chemotherapy agents: Taxol  To help prevent nausea and vomiting after your treatment, we encourage you to take your nausea medication as prescribed by your physician.   If you develop nausea and vomiting that is not controlled by your nausea medication, call the clinic.   BELOW ARE SYMPTOMS THAT SHOULD BE REPORTED IMMEDIATELY:  *FEVER GREATER THAN 100.5 F  *CHILLS WITH OR WITHOUT FEVER  NAUSEA AND VOMITING THAT IS NOT CONTROLLED WITH YOUR NAUSEA MEDICATION  *UNUSUAL SHORTNESS OF BREATH  *UNUSUAL BRUISING OR BLEEDING  TENDERNESS IN MOUTH AND THROAT WITH OR WITHOUT PRESENCE OF ULCERS  *URINARY PROBLEMS  *BOWEL PROBLEMS  UNUSUAL RASH Items with * indicate a potential emergency and should be followed up as soon as possible.  Feel free to call the clinic you have any questions or concerns. The clinic phone number is [phone].  Please show the CHEMO ALERT CARD at check-in to the Emergency Department and triage nurse.   

## 2014-12-03 ENCOUNTER — Encounter: Payer: Self-pay | Admitting: *Deleted

## 2014-12-03 ENCOUNTER — Telehealth: Payer: Self-pay | Admitting: *Deleted

## 2014-12-03 NOTE — Telephone Encounter (Signed)
Pt called to TRIAGE to state she needs a note sent to her work stating out this week per medical necessity.  Tobey states " I went to the ER Monday and then saw Dr Burr Medico on Tuesday but my temperature didn't break until yesterday "  Fax number obtained for note is [phone].  Noted obtained per " letters" and faxed to above number.

## 2014-12-05 LAB — CULTURE, BLOOD (ROUTINE X 2)
CULTURE: NO GROWTH
CULTURE: NO GROWTH

## 2014-12-07 ENCOUNTER — Other Ambulatory Visit: Payer: Self-pay | Admitting: *Deleted

## 2014-12-07 ENCOUNTER — Ambulatory Visit (HOSPITAL_BASED_OUTPATIENT_CLINIC_OR_DEPARTMENT_OTHER): Payer: BLUE CROSS/BLUE SHIELD

## 2014-12-07 ENCOUNTER — Other Ambulatory Visit (HOSPITAL_BASED_OUTPATIENT_CLINIC_OR_DEPARTMENT_OTHER): Payer: BLUE CROSS/BLUE SHIELD

## 2014-12-07 ENCOUNTER — Ambulatory Visit: Payer: BLUE CROSS/BLUE SHIELD

## 2014-12-07 VITALS — BP 132/78 | HR 79 | Temp 98.6°F | Resp 18

## 2014-12-07 DIAGNOSIS — C7800 Secondary malignant neoplasm of unspecified lung: Principal | ICD-10-CM

## 2014-12-07 DIAGNOSIS — C50511 Malignant neoplasm of lower-outer quadrant of right female breast: Secondary | ICD-10-CM

## 2014-12-07 DIAGNOSIS — Z5112 Encounter for antineoplastic immunotherapy: Secondary | ICD-10-CM

## 2014-12-07 DIAGNOSIS — Z5111 Encounter for antineoplastic chemotherapy: Secondary | ICD-10-CM | POA: Diagnosis not present

## 2014-12-07 DIAGNOSIS — Z95828 Presence of other vascular implants and grafts: Secondary | ICD-10-CM

## 2014-12-07 DIAGNOSIS — C50911 Malignant neoplasm of unspecified site of right female breast: Secondary | ICD-10-CM

## 2014-12-07 DIAGNOSIS — D6481 Anemia due to antineoplastic chemotherapy: Secondary | ICD-10-CM | POA: Diagnosis not present

## 2014-12-07 DIAGNOSIS — C50311 Malignant neoplasm of lower-inner quadrant of right female breast: Secondary | ICD-10-CM

## 2014-12-07 LAB — CBC WITH DIFFERENTIAL/PLATELET
BASO%: 0.5 % (ref 0.0–2.0)
BASOS ABS: 0 10*3/uL (ref 0.0–0.1)
EOS ABS: 0.1 10*3/uL (ref 0.0–0.5)
EOS%: 1.2 % (ref 0.0–7.0)
HEMATOCRIT: 28.2 % — AB (ref 34.8–46.6)
HGB: 9.3 g/dL — ABNORMAL LOW (ref 11.6–15.9)
LYMPH%: 42.6 % (ref 14.0–49.7)
MCH: 30.6 pg (ref 25.1–34.0)
MCHC: 33 g/dL (ref 31.5–36.0)
MCV: 92.8 fL (ref 79.5–101.0)
MONO#: 0.3 10*3/uL (ref 0.1–0.9)
MONO%: 4.5 % (ref 0.0–14.0)
NEUT#: 3 10*3/uL (ref 1.5–6.5)
NEUT%: 51.2 % (ref 38.4–76.8)
PLATELETS: 276 10*3/uL (ref 145–400)
RBC: 3.04 10*6/uL — AB (ref 3.70–5.45)
RDW: 15.4 % — AB (ref 11.2–14.5)
WBC: 5.8 10*3/uL (ref 3.9–10.3)
lymph#: 2.5 10*3/uL (ref 0.9–3.3)

## 2014-12-07 LAB — COMPREHENSIVE METABOLIC PANEL (CC13)
ALT: 25 U/L (ref 0–55)
AST: 22 U/L (ref 5–34)
Albumin: 3.5 g/dL (ref 3.5–5.0)
Alkaline Phosphatase: 57 U/L (ref 40–150)
Anion Gap: 12 mEq/L — ABNORMAL HIGH (ref 3–11)
BILIRUBIN TOTAL: 0.26 mg/dL (ref 0.20–1.20)
BUN: 10.1 mg/dL (ref 7.0–26.0)
CO2: 23 mEq/L (ref 22–29)
CREATININE: 0.7 mg/dL (ref 0.6–1.1)
Calcium: 8.9 mg/dL (ref 8.4–10.4)
Chloride: 106 mEq/L (ref 98–109)
Glucose: 107 mg/dl (ref 70–140)
Potassium: 3.4 mEq/L — ABNORMAL LOW (ref 3.5–5.1)
SODIUM: 141 meq/L (ref 136–145)
Total Protein: 6.5 g/dL (ref 6.4–8.3)

## 2014-12-07 MED ORDER — DIPHENHYDRAMINE HCL 50 MG/ML IJ SOLN
50.0000 mg | Freq: Once | INTRAMUSCULAR | Status: AC
  Administered 2014-12-07: 50 mg via INTRAVENOUS

## 2014-12-07 MED ORDER — SODIUM CHLORIDE 0.9 % IV SOLN
Freq: Once | INTRAVENOUS | Status: AC
  Administered 2014-12-07: 15:00:00 via INTRAVENOUS
  Filled 2014-12-07: qty 4

## 2014-12-07 MED ORDER — SODIUM CHLORIDE 0.9 % IJ SOLN
10.0000 mL | INTRAMUSCULAR | Status: DC | PRN
  Administered 2014-12-07: 10 mL
  Filled 2014-12-07: qty 10

## 2014-12-07 MED ORDER — TRASTUZUMAB CHEMO INJECTION 440 MG
6.0000 mg/kg | Freq: Once | INTRAVENOUS | Status: AC
  Administered 2014-12-07: 588 mg via INTRAVENOUS
  Filled 2014-12-07: qty 28

## 2014-12-07 MED ORDER — ACETAMINOPHEN 325 MG PO TABS
ORAL_TABLET | ORAL | Status: AC
  Filled 2014-12-07: qty 2

## 2014-12-07 MED ORDER — DIPHENHYDRAMINE HCL 50 MG/ML IJ SOLN
INTRAMUSCULAR | Status: AC
  Filled 2014-12-07: qty 1

## 2014-12-07 MED ORDER — PERTUZUMAB CHEMO INJECTION 420 MG/14ML
420.0000 mg | Freq: Once | INTRAVENOUS | Status: AC
  Administered 2014-12-07: 420 mg via INTRAVENOUS
  Filled 2014-12-07: qty 14

## 2014-12-07 MED ORDER — ACETAMINOPHEN 325 MG PO TABS
650.0000 mg | ORAL_TABLET | Freq: Once | ORAL | Status: AC
  Administered 2014-12-07: 650 mg via ORAL

## 2014-12-07 MED ORDER — PACLITAXEL CHEMO INJECTION 300 MG/50ML
70.0000 mg/m2 | Freq: Once | INTRAVENOUS | Status: AC
  Administered 2014-12-07: 156 mg via INTRAVENOUS
  Filled 2014-12-07: qty 26

## 2014-12-07 MED ORDER — METHYLPREDNISOLONE SODIUM SUCC 40 MG IJ SOLR
40.0000 mg | Freq: Once | INTRAMUSCULAR | Status: AC
  Administered 2014-12-07: 40 mg via INTRAVENOUS

## 2014-12-07 MED ORDER — FAMOTIDINE IN NACL 20-0.9 MG/50ML-% IV SOLN
INTRAVENOUS | Status: AC
  Filled 2014-12-07: qty 50

## 2014-12-07 MED ORDER — HEPARIN SOD (PORK) LOCK FLUSH 100 UNIT/ML IV SOLN
500.0000 [IU] | Freq: Once | INTRAVENOUS | Status: AC | PRN
  Administered 2014-12-07: 500 [IU]
  Filled 2014-12-07: qty 5

## 2014-12-07 MED ORDER — HYDROCORTISONE 2.5 % EX CREA: TOPICAL_CREAM | CUTANEOUS | Status: DC

## 2014-12-07 MED ORDER — METHYLPREDNISOLONE SODIUM SUCC 40 MG IJ SOLR
INTRAMUSCULAR | Status: AC
  Filled 2014-12-07: qty 1

## 2014-12-07 MED ORDER — SODIUM CHLORIDE 0.9 % IV SOLN
Freq: Once | INTRAVENOUS | Status: AC
  Administered 2014-12-07: 14:00:00 via INTRAVENOUS

## 2014-12-07 MED ORDER — FAMOTIDINE IN NACL 20-0.9 MG/50ML-% IV SOLN
20.0000 mg | Freq: Once | INTRAVENOUS | Status: AC
  Administered 2014-12-07: 20 mg via INTRAVENOUS

## 2014-12-07 MED ORDER — SODIUM CHLORIDE 0.9 % IJ SOLN
10.0000 mL | INTRAMUSCULAR | Status: DC | PRN
Start: 2014-12-07 — End: 2014-12-07
  Administered 2014-12-07: 10 mL via INTRAVENOUS
  Filled 2014-12-07: qty 10

## 2014-12-07 NOTE — Patient Instructions (Addendum)
[facility] Discharge Instructions for Patients Receiving Chemotherapy  Today you received the following chemotherapy agents: Taxol, herceptin, perjeta  Your potassium today was 3.4. Increase potassium in your diet through foods such as baked potatoes, beans, yogurt, bananas, and orange juice.  To help prevent nausea and vomiting after your treatment, we encourage you to take your nausea medication as prescribed.    If you develop nausea and vomiting that is not controlled by your nausea medication, call the clinic.   BELOW ARE SYMPTOMS THAT SHOULD BE REPORTED IMMEDIATELY:  *FEVER GREATER THAN 100.5 F  *CHILLS WITH OR WITHOUT FEVER  NAUSEA AND VOMITING THAT IS NOT CONTROLLED WITH YOUR NAUSEA MEDICATION  *UNUSUAL SHORTNESS OF BREATH  *UNUSUAL BRUISING OR BLEEDING  TENDERNESS IN MOUTH AND THROAT WITH OR WITHOUT PRESENCE OF ULCERS  *URINARY PROBLEMS  *BOWEL PROBLEMS  UNUSUAL RASH Items with * indicate a potential emergency and should be followed up as soon as possible.  Feel free to call the clinic you have any questions or concerns. The clinic phone number is [phone].  Please show the [facility] at check-in to the Emergency Department and triage nurse.

## 2014-12-07 NOTE — Patient Instructions (Signed)

## 2014-12-09 ENCOUNTER — Telehealth: Payer: Self-pay | Admitting: *Deleted

## 2014-12-09 NOTE — Telephone Encounter (Signed)
Received message from patient concerning her toenail.  Called her back and left message for a return phone call.  Awaiting patient response.

## 2014-12-14 ENCOUNTER — Ambulatory Visit: Payer: BLUE CROSS/BLUE SHIELD

## 2014-12-14 ENCOUNTER — Encounter: Payer: Self-pay | Admitting: Hematology

## 2014-12-14 ENCOUNTER — Ambulatory Visit: Payer: BLUE CROSS/BLUE SHIELD | Admitting: Nutrition

## 2014-12-14 ENCOUNTER — Other Ambulatory Visit (HOSPITAL_BASED_OUTPATIENT_CLINIC_OR_DEPARTMENT_OTHER): Payer: BLUE CROSS/BLUE SHIELD

## 2014-12-14 ENCOUNTER — Ambulatory Visit (HOSPITAL_BASED_OUTPATIENT_CLINIC_OR_DEPARTMENT_OTHER): Payer: BLUE CROSS/BLUE SHIELD

## 2014-12-14 ENCOUNTER — Other Ambulatory Visit: Payer: Self-pay | Admitting: Hematology

## 2014-12-14 VITALS — BP 151/81 | HR 92 | Temp 98.6°F | Resp 16

## 2014-12-14 DIAGNOSIS — Z95828 Presence of other vascular implants and grafts: Secondary | ICD-10-CM

## 2014-12-14 DIAGNOSIS — C50919 Malignant neoplasm of unspecified site of unspecified female breast: Secondary | ICD-10-CM

## 2014-12-14 DIAGNOSIS — Z5111 Encounter for antineoplastic chemotherapy: Secondary | ICD-10-CM

## 2014-12-14 DIAGNOSIS — C50511 Malignant neoplasm of lower-outer quadrant of right female breast: Secondary | ICD-10-CM | POA: Diagnosis not present

## 2014-12-14 DIAGNOSIS — C7800 Secondary malignant neoplasm of unspecified lung: Secondary | ICD-10-CM

## 2014-12-14 DIAGNOSIS — C50911 Malignant neoplasm of unspecified site of right female breast: Secondary | ICD-10-CM

## 2014-12-14 LAB — COMPREHENSIVE METABOLIC PANEL (CC13)
ALK PHOS: 64 U/L (ref 40–150)
ALT: 31 U/L (ref 0–55)
ANION GAP: 11 meq/L (ref 3–11)
AST: 25 U/L (ref 5–34)
Albumin: 3.6 g/dL (ref 3.5–5.0)
BUN: 11.7 mg/dL (ref 7.0–26.0)
CHLORIDE: 106 meq/L (ref 98–109)
CO2: 26 meq/L (ref 22–29)
Calcium: 9.4 mg/dL (ref 8.4–10.4)
Creatinine: 0.7 mg/dL (ref 0.6–1.1)
EGFR: >90 mL/min/{1.73_m2} (ref >90)
GLUCOSE: 104 mg/dL (ref 70–140)
Potassium: 3.5 mEq/L (ref 3.5–5.1)
SODIUM: 142 meq/L (ref 136–145)
TOTAL PROTEIN: 6.9 g/dL (ref 6.4–8.3)
Total Bilirubin: 0.51 mg/dL (ref 0.20–1.20)

## 2014-12-14 LAB — CBC WITH DIFFERENTIAL/PLATELET
BASO%: 0.5 % (ref 0.0–2.0)
BASOS ABS: 0 10*3/uL (ref 0.0–0.1)
EOS ABS: 0.1 10*3/uL (ref 0.0–0.5)
EOS%: 0.9 % (ref 0.0–7.0)
HCT: 31.1 % — ABNORMAL LOW (ref 34.8–46.6)
HGB: 10.2 g/dL — ABNORMAL LOW (ref 11.6–15.9)
LYMPH%: 32 % (ref 14.0–49.7)
MCH: 30.7 pg (ref 25.1–34.0)
MCHC: 32.8 g/dL (ref 31.5–36.0)
MCV: 93.7 fL (ref 79.5–101.0)
MONO#: 0.5 10*3/uL (ref 0.1–0.9)
MONO%: 7.7 % (ref 0.0–14.0)
NEUT%: 58.9 % (ref 38.4–76.8)
NEUTROS ABS: 3.9 10*3/uL (ref 1.5–6.5)
Platelets: 316 10*3/uL (ref 145–400)
RBC: 3.32 10*6/uL — ABNORMAL LOW (ref 3.70–5.45)
RDW: 15.4 % — AB (ref 11.2–14.5)
WBC: 6.7 10*3/uL (ref 3.9–10.3)
lymph#: 2.1 10*3/uL (ref 0.9–3.3)

## 2014-12-14 MED ORDER — DIPHENHYDRAMINE HCL 50 MG/ML IJ SOLN
INTRAMUSCULAR | Status: AC
  Filled 2014-12-14: qty 1

## 2014-12-14 MED ORDER — SODIUM CHLORIDE 0.9 % IV SOLN
Freq: Once | INTRAVENOUS | Status: AC
  Administered 2014-12-14: 13:00:00 via INTRAVENOUS

## 2014-12-14 MED ORDER — HEPARIN SOD (PORK) LOCK FLUSH 100 UNIT/ML IV SOLN
500.0000 [IU] | Freq: Once | INTRAVENOUS | Status: AC | PRN
  Administered 2014-12-14: 500 [IU]
  Filled 2014-12-14: qty 5

## 2014-12-14 MED ORDER — SODIUM CHLORIDE 0.9 % IJ SOLN
10.0000 mL | INTRAMUSCULAR | Status: DC | PRN
  Administered 2014-12-14: 10 mL
  Filled 2014-12-14: qty 10

## 2014-12-14 MED ORDER — DIPHENHYDRAMINE HCL 50 MG/ML IJ SOLN
50.0000 mg | Freq: Once | INTRAMUSCULAR | Status: AC
  Administered 2014-12-14: 50 mg via INTRAVENOUS

## 2014-12-14 MED ORDER — FAMOTIDINE IN NACL 20-0.9 MG/50ML-% IV SOLN
20.0000 mg | Freq: Once | INTRAVENOUS | Status: AC
  Administered 2014-12-14: 20 mg via INTRAVENOUS

## 2014-12-14 MED ORDER — SODIUM CHLORIDE 0.9 % IJ SOLN
10.0000 mL | INTRAMUSCULAR | Status: DC | PRN
  Administered 2014-12-14: 10 mL via INTRAVENOUS
  Filled 2014-12-14: qty 10

## 2014-12-14 MED ORDER — PACLITAXEL CHEMO INJECTION 300 MG/50ML
70.0000 mg/m2 | Freq: Once | INTRAVENOUS | Status: AC
  Administered 2014-12-14: 156 mg via INTRAVENOUS
  Filled 2014-12-14: qty 26

## 2014-12-14 MED ORDER — FAMOTIDINE IN NACL 20-0.9 MG/50ML-% IV SOLN
INTRAVENOUS | Status: AC
  Filled 2014-12-14: qty 50

## 2014-12-14 MED ORDER — SODIUM CHLORIDE 0.9 % IV SOLN
Freq: Once | INTRAVENOUS | Status: AC
  Administered 2014-12-14: 13:00:00 via INTRAVENOUS
  Filled 2014-12-14: qty 4

## 2014-12-14 NOTE — Patient Instructions (Signed)

## 2014-12-14 NOTE — Progress Notes (Signed)
Patient complains of new onset of headaches since last visit. Patient has had three headaches in the last week. Headaches usually occur in the morning in the same area each time from occiputal area to neck, Headaches subside without need for medications. Patient denies vision changes, light headedess or dizziness.  Dr. Burr Medico notified and no new orders at this time. Okay to treat. Informed patient to monitor headaches and call clinic of any worsening or changes. Patient verbalizes understanding.

## 2014-12-14 NOTE — Patient Instructions (Signed)
[facility] Discharge Instructions for Patients Receiving Chemotherapy  Today you received the following chemotherapy agents:  Taxol  To help prevent nausea and vomiting after your treatment, we encourage you to take your nausea medication as prescribed.   If you develop nausea and vomiting that is not controlled by your nausea medication, call the clinic.   BELOW ARE SYMPTOMS THAT SHOULD BE REPORTED IMMEDIATELY:  *FEVER GREATER THAN 100.5 F  *CHILLS WITH OR WITHOUT FEVER  NAUSEA AND VOMITING THAT IS NOT CONTROLLED WITH YOUR NAUSEA MEDICATION  *UNUSUAL SHORTNESS OF BREATH  *UNUSUAL BRUISING OR BLEEDING  TENDERNESS IN MOUTH AND THROAT WITH OR WITHOUT PRESENCE OF ULCERS  *URINARY PROBLEMS  *BOWEL PROBLEMS  UNUSUAL RASH Items with * indicate a potential emergency and should be followed up as soon as possible.  Feel free to call the clinic you have any questions or concerns. The clinic phone number is [phone].  Please show the CHEMO ALERT CARD at check-in to the Emergency Department and triage nurse.   

## 2014-12-14 NOTE — Progress Notes (Signed)
Brief nutrition follow-up completed with patient during infusion.  Patient denies nutrition impact symptoms. Weight is stable at 209.3 pounds.  Nutrition diagnosis: Unintended weight loss resolved.  Encouraged patient to continue strategies for weight maintenance including healthy plant-based diet and lean proteins. Encouraged patient to contact me with any questions or concerns.  **Disclaimer: This note was dictated with voice recognition software. Similar sounding words can inadvertently be transcribed and this note may contain transcription errors which may not have been corrected upon publication of note.**

## 2014-12-20 ENCOUNTER — Encounter (HOSPITAL_COMMUNITY): Payer: Self-pay

## 2014-12-20 ENCOUNTER — Ambulatory Visit (HOSPITAL_COMMUNITY)
Admission: RE | Admit: 2014-12-20 | Discharge: 2014-12-20 | Disposition: A | Discharge status: Home / Self Care | Payer: BLUE CROSS/BLUE SHIELD | Source: Ambulatory Visit | Attending: Hematology | Admitting: Hematology

## 2014-12-20 DIAGNOSIS — C7800 Secondary malignant neoplasm of unspecified lung: Secondary | ICD-10-CM

## 2014-12-20 DIAGNOSIS — C50911 Malignant neoplasm of unspecified site of right female breast: Secondary | ICD-10-CM

## 2014-12-20 DIAGNOSIS — C50919 Malignant neoplasm of unspecified site of unspecified female breast: Secondary | ICD-10-CM | POA: Insufficient documentation

## 2014-12-20 DIAGNOSIS — Z79899 Other long term (current) drug therapy: Secondary | ICD-10-CM | POA: Insufficient documentation

## 2014-12-20 MED ORDER — IOHEXOL 300 MG/ML  SOLN
100.0000 mL | Freq: Once | INTRAMUSCULAR | Status: AC | PRN
  Administered 2014-12-20: 100 mL via INTRAVENOUS

## 2014-12-21 ENCOUNTER — Ambulatory Visit (HOSPITAL_BASED_OUTPATIENT_CLINIC_OR_DEPARTMENT_OTHER): Payer: BLUE CROSS/BLUE SHIELD | Admitting: Hematology

## 2014-12-21 ENCOUNTER — Telehealth: Payer: Self-pay | Admitting: Hematology

## 2014-12-21 ENCOUNTER — Ambulatory Visit: Payer: BLUE CROSS/BLUE SHIELD

## 2014-12-21 ENCOUNTER — Other Ambulatory Visit (HOSPITAL_BASED_OUTPATIENT_CLINIC_OR_DEPARTMENT_OTHER): Payer: BLUE CROSS/BLUE SHIELD

## 2014-12-21 ENCOUNTER — Ambulatory Visit (HOSPITAL_BASED_OUTPATIENT_CLINIC_OR_DEPARTMENT_OTHER): Payer: BLUE CROSS/BLUE SHIELD

## 2014-12-21 ENCOUNTER — Encounter: Payer: Self-pay | Admitting: Hematology

## 2014-12-21 VITALS — BP 147/89 | HR 107 | Temp 98.5°F | Resp 18 | Ht 70.0 in | Wt 209.4 lb

## 2014-12-21 DIAGNOSIS — C50919 Malignant neoplasm of unspecified site of unspecified female breast: Secondary | ICD-10-CM

## 2014-12-21 DIAGNOSIS — C7802 Secondary malignant neoplasm of left lung: Secondary | ICD-10-CM

## 2014-12-21 DIAGNOSIS — Z5111 Encounter for antineoplastic chemotherapy: Secondary | ICD-10-CM | POA: Diagnosis not present

## 2014-12-21 DIAGNOSIS — C7801 Secondary malignant neoplasm of right lung: Secondary | ICD-10-CM

## 2014-12-21 DIAGNOSIS — Z171 Estrogen receptor negative status [ER-]: Secondary | ICD-10-CM | POA: Diagnosis not present

## 2014-12-21 DIAGNOSIS — C7800 Secondary malignant neoplasm of unspecified lung: Principal | ICD-10-CM

## 2014-12-21 DIAGNOSIS — C50311 Malignant neoplasm of lower-inner quadrant of right female breast: Secondary | ICD-10-CM

## 2014-12-21 DIAGNOSIS — R21 Rash and other nonspecific skin eruption: Secondary | ICD-10-CM

## 2014-12-21 DIAGNOSIS — Z95828 Presence of other vascular implants and grafts: Secondary | ICD-10-CM

## 2014-12-21 DIAGNOSIS — C50911 Malignant neoplasm of unspecified site of right female breast: Secondary | ICD-10-CM

## 2014-12-21 DIAGNOSIS — D6481 Anemia due to antineoplastic chemotherapy: Secondary | ICD-10-CM

## 2014-12-21 LAB — COMPREHENSIVE METABOLIC PANEL (CC13)
ALBUMIN: 3.7 g/dL (ref 3.5–5.0)
ALT: 23 U/L (ref 0–55)
ANION GAP: 13 meq/L — AB (ref 3–11)
AST: 19 U/L (ref 5–34)
Alkaline Phosphatase: 63 U/L (ref 40–150)
BUN: 8.1 mg/dL (ref 7.0–26.0)
CHLORIDE: 104 meq/L (ref 98–109)
CO2: 25 meq/L (ref 22–29)
Calcium: 9.2 mg/dL (ref 8.4–10.4)
Creatinine: 0.8 mg/dL (ref 0.6–1.1)
EGFR: >90 mL/min/{1.73_m2} (ref >90)
Glucose: 95 mg/dl (ref 70–140)
POTASSIUM: 3.5 meq/L (ref 3.5–5.1)
SODIUM: 141 meq/L (ref 136–145)
Total Bilirubin: 0.45 mg/dL (ref 0.20–1.20)
Total Protein: 6.9 g/dL (ref 6.4–8.3)

## 2014-12-21 LAB — CBC WITH DIFFERENTIAL/PLATELET
BASO%: 0.5 % (ref 0.0–2.0)
BASOS ABS: 0 10*3/uL (ref 0.0–0.1)
EOS%: 1.1 % (ref 0.0–7.0)
Eosinophils Absolute: 0.1 10*3/uL (ref 0.0–0.5)
HCT: 32.3 % — ABNORMAL LOW (ref 34.8–46.6)
HEMOGLOBIN: 10.6 g/dL — AB (ref 11.6–15.9)
LYMPH%: 42 % (ref 14.0–49.7)
MCH: 30.7 pg (ref 25.1–34.0)
MCHC: 32.8 g/dL (ref 31.5–36.0)
MCV: 93.6 fL (ref 79.5–101.0)
MONO#: 0.5 10*3/uL (ref 0.1–0.9)
MONO%: 7.8 % (ref 0.0–14.0)
NEUT%: 48.6 % (ref 38.4–76.8)
NEUTROS ABS: 3 10*3/uL (ref 1.5–6.5)
PLATELETS: 307 10*3/uL (ref 145–400)
RBC: 3.45 10*6/uL — AB (ref 3.70–5.45)
RDW: 15.4 % — AB (ref 11.2–14.5)
WBC: 6.2 10*3/uL (ref 3.9–10.3)
lymph#: 2.6 10*3/uL (ref 0.9–3.3)

## 2014-12-21 MED ORDER — SODIUM CHLORIDE 0.9 % IV SOLN
Freq: Once | INTRAVENOUS | Status: AC
  Administered 2014-12-21: 14:00:00 via INTRAVENOUS

## 2014-12-21 MED ORDER — SODIUM CHLORIDE 0.9 % IJ SOLN
10.0000 mL | INTRAMUSCULAR | Status: DC | PRN
  Administered 2014-12-21: 10 mL via INTRAVENOUS
  Filled 2014-12-21: qty 10

## 2014-12-21 MED ORDER — DIPHENHYDRAMINE HCL 50 MG/ML IJ SOLN
50.0000 mg | Freq: Once | INTRAMUSCULAR | Status: AC
  Administered 2014-12-21: 50 mg via INTRAVENOUS

## 2014-12-21 MED ORDER — FAMOTIDINE IN NACL 20-0.9 MG/50ML-% IV SOLN
INTRAVENOUS | Status: AC
  Filled 2014-12-21: qty 50

## 2014-12-21 MED ORDER — SODIUM CHLORIDE 0.9 % IV SOLN
Freq: Once | INTRAVENOUS | Status: AC
  Administered 2014-12-21: 14:00:00 via INTRAVENOUS
  Filled 2014-12-21: qty 4

## 2014-12-21 MED ORDER — HEPARIN SOD (PORK) LOCK FLUSH 100 UNIT/ML IV SOLN
500.0000 [IU] | Freq: Once | INTRAVENOUS | Status: AC | PRN
  Administered 2014-12-21: 500 [IU]
  Filled 2014-12-21: qty 5

## 2014-12-21 MED ORDER — PACLITAXEL CHEMO INJECTION 300 MG/50ML
70.0000 mg/m2 | Freq: Once | INTRAVENOUS | Status: AC
  Administered 2014-12-21: 156 mg via INTRAVENOUS
  Filled 2014-12-21: qty 26

## 2014-12-21 MED ORDER — DIPHENHYDRAMINE HCL 50 MG/ML IJ SOLN
INTRAMUSCULAR | Status: AC
  Filled 2014-12-21: qty 1

## 2014-12-21 MED ORDER — SODIUM CHLORIDE 0.9 % IJ SOLN
10.0000 mL | INTRAMUSCULAR | Status: DC | PRN
  Administered 2014-12-21: 10 mL
  Filled 2014-12-21: qty 10

## 2014-12-21 MED ORDER — FAMOTIDINE IN NACL 20-0.9 MG/50ML-% IV SOLN
20.0000 mg | Freq: Once | INTRAVENOUS | Status: AC
  Administered 2014-12-21: 20 mg via INTRAVENOUS

## 2014-12-21 NOTE — Patient Instructions (Signed)
[facility] Discharge Instructions for Patients Receiving Chemotherapy  Today you received the following chemotherapy agents:  Taxol  To help prevent nausea and vomiting after your treatment, we encourage you to take your nausea medication as prescribed.   If you develop nausea and vomiting that is not controlled by your nausea medication, call the clinic.   BELOW ARE SYMPTOMS THAT SHOULD BE REPORTED IMMEDIATELY:  *FEVER GREATER THAN 100.5 F  *CHILLS WITH OR WITHOUT FEVER  NAUSEA AND VOMITING THAT IS NOT CONTROLLED WITH YOUR NAUSEA MEDICATION  *UNUSUAL SHORTNESS OF BREATH  *UNUSUAL BRUISING OR BLEEDING  TENDERNESS IN MOUTH AND THROAT WITH OR WITHOUT PRESENCE OF ULCERS  *URINARY PROBLEMS  *BOWEL PROBLEMS  UNUSUAL RASH Items with * indicate a potential emergency and should be followed up as soon as possible.  Feel free to call the clinic you have any questions or concerns. The clinic phone number is [phone].  Please show the CHEMO ALERT CARD at check-in to the Emergency Department and triage nurse.   

## 2014-12-21 NOTE — Progress Notes (Signed)
[name] [name]  Follow up note   Patient Care Team: [name], DO as PCP - General (Family Medicine) [name], DO (Family Medicine) Holley Bouche, NP as Nurse Practitioner (Nurse Practitioner) Stark Klein, MD as Consulting Physician (General Surgery) Arloa Koh, MD as Consulting Physician (Radiation Oncology) [name], MD as Consulting Physician (Hematology)  CHIEF COMPLAINTS Follow up metastatic breast cancer  Oncology History   Breast cancer 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   Staging form: Breast, AJCC 7th Edition     Clinical stage from 07/22/2014: Stage IV (T3, N1, M1) - Unsigned       Breast cancer 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   07/02/2014 Mammogram Mammogram showed a 2cm right beast mass and a 1.8cm right axillary node. MRI breast on 07/16/2014 showed 7cm R breast lesion and 4.4cm r axillary node    07/02/2014 Imaging CT CAP: a 4.7cm mass in LUL lung and a 2.1cm mas in RML, and a small nodule in RUL, suspecious for metastasis     07/09/2014 Initial Diagnosis right IDA with b/l lung lesions, ER-/PR-/HER2+   07/09/2014 Initial Biopsy US guided right breast mass and axillary node biopsy showed IDA, and DCIS, ER-/PR-/HER2+   07/26/2014 Pathologic Stage Left lung mass by IR, path revealed high grade carcinoma, morphology similar to breast tumor biopsy, TTF(-), NapsinA(-), ER(-)   08/04/2014 -  Chemotherapy weekly Paclitaxel 33m/m2, trastuzumab and pertuzumab every 3 weeks   10/04/2014 Imaging Interval decrease in the right axillary lymphadenopathy. Bilateral pulmonary lesions with left hilar lymphadenopathy also markedly decreased in the interval. The left hilar lymphadenopathy has resolved.    CURRENT THERAPY: weekly Paclitaxel 83mm2, trastuzumab and pertuzumab every 3 weeks, started on 08/04/2014  INTERVAL HISTROY   NiLajoyeturns for follow-up and 21th dose of Taxol. She is doing well overall. She still has mild tingling and numbness on the fingers and toes,  stable, able to do most and activities, was some extra effort. Her finger and toenails are getting slightly worse, with intermittent small discharged from nailbed, sensitive to touch, and they are big toenail is probably coming off. She had a moderate diarrhea after chemotherapy, which resolved with Imodium or Zofran. She continues working full-time, due to her daughters softball games, and functions well at home.  MEDICAL HISTORY:  Past Medical History  Diagnosis Date  . Breast cancer   . Pneumonia     hx of pneumonia 08/2013   . GERD (gastroesophageal reflux disease)     during pregnancy   . Breast cancer     SURGICAL HISTORY: Past Surgical History  Procedure Laterality Date  . Cesarean section    . Essure tubal ligation    . Cholecystectomy    . Wisdom tooth extraction    . Portacath placement Left 07/21/2014    Procedure: INSERTION PORT-A-CATH;  Surgeon: FaStark KleinMD;  Location: [facility];  Service: General;  Laterality: Left;    SOCIAL HISTORY: History   Social History  . Marital Status: Married    Spouse Name: N/A    Number of Children:  she has 2 daughters at the age of [age]  . Years of Education: N/A   Occupational History  .  works as maFreight forwarderor a caFilm/video editor   Social History Main Topics  . Smoking status: Never Smoker   . Smokeless tobacco: Not on file  . Alcohol Use: Yes  . Drug Use: No  . Sexual Activity: No    FAMILY HISTORY: Family History  Problem Relation Age of Onset  . Breast cancer Mother 33  . Liver cancer Father 32  . Breast cancer Maternal Aunt 36  . Prostate cancer Maternal Grandfather   . Liver cancer Paternal Grandmother   . Prostate cancer Paternal Grandfather      GENETICS: 08/30/2014 BreastNext panel was negative. 17 genes including BRCA1, BRCA2, were negative for mutations.   ALLERGIES:  is allergic to aspirin and codeine.  MEDICATIONS:  Current Outpatient Prescriptions  Medication Sig Dispense Refill  .  albuterol (PROVENTIL HFA;VENTOLIN HFA) 108 (90 BASE) MCG/ACT inhaler Inhale 2 puffs into the lungs every 4 (four) hours as needed for wheezing or shortness of breath.    . Camphor-Eucalyptus-Menthol (VICKS VAPORUB EX) Apply 1 application topically at bedtime as needed (sinus). Applies under nose, throat and on chest.    . cetirizine (ZYRTEC) 10 MG tablet Take 10 mg by mouth daily as needed for allergies.     . clindamycin (CLINDAGEL) 1 % gel Apply topically to affected areas BID. 60 g 3  . diphenhydrAMINE (SOMINEX) 25 MG tablet Take 50 mg by mouth at bedtime as needed for sleep.    Marland Kitchen guaiFENesin (ROBITUSSIN) 100 MG/5ML liquid Take 200 mg by mouth 3 (three) times daily as needed for cough.    . hydrocortisone 2.5 % cream APPLY TOPICALLY TO THE AFFECTED AREA TWICE DAILY 454 g 0  . HYDROCORTISONE, TOPICAL, 2 % LOTN Apply topically to affected areas BID. 29.6 mL 1  . ibuprofen (ADVIL,MOTRIN) 200 MG tablet Take 400-800 mg by mouth every 6 (six) hours as needed for headache or moderate pain.    Marland Kitchen lidocaine-prilocaine (EMLA) cream APPLY TO PORTACATH 1 AND 1/2- 2 HOURS PRIOR TO PROCEDURE AS NEEDED 30 g 1  . loperamide (IMODIUM A-D) 2 MG tablet Take 1 tablet (2 mg total) by mouth 4 (four) times daily as needed for diarrhea or loose stools. 30 tablet 0  . ondansetron (ZOFRAN) 8 MG tablet Take 1 tablet (8 mg total) by mouth every 8 (eight) hours as needed for nausea or vomiting. 30 tablet 3  . pantoprazole (PROTONIX) 20 MG tablet Take 1 tablet (20 mg total) by mouth daily. 30 tablet 3  . Pseudoeph-Doxylamine-DM-APAP (NYQUIL PO) Take 30 mLs by mouth 2 (two) times daily as needed (cold/allergies).     . metaxalone (SKELAXIN) 800 MG tablet Take 800 mg by mouth 3 (three) times daily as needed for muscle spasms.    Marland Kitchen oxyCODONE-acetaminophen (ROXICET) 5-325 MG per tablet Take 1-2 tablets by mouth every 4 (four) hours as needed for severe pain. (Patient not taking: Reported on 12/21/2014) 30 tablet 0  . zolpidem  (AMBIEN) 5 MG tablet Take 1 tablet (5 mg total) by mouth at bedtime as needed for sleep. (Patient not taking: Reported on 11/28/2014) 20 tablet 0   No current facility-administered medications for this visit.    REVIEW OF SYSTEMS:   Constitutional: Denies fevers, chills or abnormal night sweats, (+) malaise  Eyes: Denies blurriness of vision, double vision or watery eyes Ears, nose, mouth, throat, and face: Denies mucositis or sore throat Respiratory: (+) productive cough, no dyspnea or wheezes Cardiovascular: Denies palpitation, chest discomfort or lower extremity swelling Gastrointestinal:  Denies nausea, heartburn or change in bowel habits Skin:(+) rashes  Lymphatics: Denies new lymphadenopathy or easy bruising Neurological:Denies numbness, tingling or new weaknesses Behavioral/Psych: Mood is stable, no new changes  All other systems were reviewed with the patient and are negative.  PHYSICAL EXAMINATION: ECOG PERFORMANCE STATUS: 1 BP 147/89  mmHg  Pulse 107  Temp(Src) 98.5 F (36.9 C) (Oral)  Resp 18  Ht 5' 10"  (1.778 m)  Wt 209 lb 6.4 oz (94.983 kg)  BMI 30.05 kg/m2  SpO2 99%  LMP 08/15/2014 GENERAL:alert, no distress and comfortable SKIN: skin color, texture, turgor are normal, no rashes or significant lesions EYES: normal, conjunctiva are pink and non-injected, sclera clear OROPHARYNX:no exudate, no erythema and lips, buccal mucosa, and tongue normal  NECK: supple, thyroid normal size, non-tender, without nodularity LYMPH:  no palpable lymphadenopathy in the cervical, axillary or inguinal LUNGS: clear to auscultation and percussion with normal breathing effort HEART: regular rate & rhythm and no murmurs and no lower extremity edema ABDOMEN:abdomen soft, non-tender and normal bowel sounds Musculoskeletal:no cyanosis of digits and no clubbing  PSYCH: alert & oriented x 3 with fluent speech NEURO: no focal motor/sensory deficits Breasts: Breast inspection showed them to be  symmetrical with no skin change or nipple discharge. Palpation of the right breast showed a 2.0X2.0cm mass in the lower inner quadrant, which is softer than before, and the previously palpable right axillary node is not palpable anymore except some fullness. Left breast  and axilla revealed no obvious mass that I could appreciate. Skin: (+) Skin pigmentation on hands and feet.  a few acne-like rash on face and neck. No new skin rashes. (+)  pigmentation on fingernails, and bulging of fingernails and thickening of soft tissue under nailbed.   LABORATORY DATA:  I have reviewed the data as listed CBC Latest Ref Rng 12/21/2014 12/14/2014 12/07/2014  WBC 3.9 - 10.3 10e3/uL 6.2 6.7 5.8  Hemoglobin 11.6 - 15.9 g/dL 10.6(L) 10.2(L) 9.3(L)  Hematocrit 34.8 - 46.6 % 32.3(L) 31.1(L) 28.2(L)  Platelets 145 - 400 10e3/uL 307 316 276    CMP Latest Ref Rng 12/21/2014 12/14/2014 12/07/2014  Glucose 70 - 140 mg/dl 95 104 107  BUN 7.0 - 26.0 mg/dL 8.1 11.7 10.1  Creatinine 0.6 - 1.1 mg/dL 0.8 0.7 0.7  Sodium 136 - 145 mEq/L 141 142 141  Potassium 3.5 - 5.1 mEq/L 3.5 3.5 3.4(L)  Chloride 96 - 112 mmol/L - - -  CO2 22 - 29 mEq/L 25 26 23   Calcium 8.4 - 10.4 mg/dL 9.2 9.4 8.9  Total Protein 6.4 - 8.3 g/dL 6.9 6.9 6.5  Total Bilirubin 0.20 - 1.20 mg/dL 0.45 0.51 0.26  Alkaline Phos 40 - 150 U/L 63 64 57  AST 5 - 34 U/L 19 25 22   ALT 0 - 55 U/L 23 31 25      PATHOLOGY REPORT 07/09/2014 #1 breast, right needle core biopsy more anterior -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 45)  Lung, biopsy, Left 07/26/2014 - HIGH GRADE CARCINOMA, SEE COMMENT. Microscopic Comment The carcinoma demonstrates the following immunophenotype: TTF-1 - negative expression. Napsin A- negative expression. CK5/6  - focal, moderate expression. estrogen receptor - negative expression. GCDFP- negative expression. The recent breast biopsy demonstrating Her2 amplified high grade invasive mammary carcinoma is noted ([id number]). Although the immunophenotype of the current case is non-sepcific, it strongly argues against primary lung adenocarcinoma. However, on re-review, the morphology of the current case is essentially identical to the primary mammary carcinoma. In lieu of further immunophenotyping, the tumor will be submitted for Her2 testing and the remaining tissue will be reserved for additional ancillary tumor testing. The  results of the Her2 testing will be reported in an addendum. The case was discussed with Dr Burr Medico on 07/28/2015 and 07/29/2015 HER2 POSITIVE    RADIOGRAPHIC STUDIES: CT CAP 10/04/2014 IMPRESSION: Interval decrease in the right axillary lymphadenopathy.  Bilateral pulmonary lesions with left hilar lymphadenopathy also markedly decreased in the interval. The left hilar lymphadenopathy has resolved.  3 millimeter nonobstructing right renal stone.   CT chest, abdomen and pelvis with contrast 12/20/2014 IMPRESSION: 1. Today's study is very similar to the prior examination from 10/04/2014, demonstrating stable size of metastatic lesions in the right middle lobe and left upper lobe, and no new evidence of metastatic disease in the thorax. 2. 2.0 x 0.9 cm lesion in the medial aspect of the right breast is similar to prior examination. No residual right axillary lymphadenopathy. 3. As with prior examinations, there is no evidence of metastatic disease to the abdomen or pelvis. 4. 3 mm nonobstructive calculus in the lower pole collecting system of the right kidney.   ECHO 10/19/2014 Impressions:  - Normal biventricular size and systolic function. Abnormal relaxation with normal filling pressures.  Normal strain parameteres: Global longitudinal strain:  -17.5% Lateral S prime: 12 cm/sec.  ASSESSMENT & PLAN:  44 year old African-American female, premenopausal, without significant past medical history, presented with palpable right breast mass and right axillary mass.   1. 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 -I reviewed her second restaging CT scan with her and her husband, and personally reviewed the images with them. It showed stable disease, no new lesions.  -We discussed the possibility of breast surgery, if she has complete response in her pulmonary metastasis on the next restaging scan. -I'll obtain a PET scan in 2 months to evaluate her response and tumor activity in the residual lung lesions -continue weekly paclitaxel, trastuzumab and pertuzumab every 3 weeks. I will likely to continue this regiment for 6-9 months. If she has excellent response, then I'll change to Herceptin maintenance therapy. -G1-2 abdominal cramps and diarrhea, likely secondary to Taxol, improved after increasing premed dexamethasone back to 20 mg. She also had mild reaction to Herceptin or pertuzumab, on solu-medro 22m iv as premeds.  -She had normal echo at baseline, repeated one normal. Will monitor ECHO  every 3 months when she is on dual anti-HER-2 therapy, next echo due on 01/19/2015. - due to her worsening abdominal pain and neuropathy, I'll decrease her weekly Taxol dosed to 70 mg/m , and she has been tolerating it well  - watch peripheral neuropathy closely.  -Lab reviewed, adequate for treatment, we'll proceed cycle 21 Taxol today, cycle 7 Herceptin and Perjeta next week  2. Skin rashes on face and scalp, likely related to antibody therapy  -The rash on upper chest much improved with topical steroids - continue topical steroids and clindamycin gel as needed    3. Nail changes -secondary to Taxol. -continue warm water with viniger soaking once daily, holding ice bag he hands during chemotherapy -she finished a course of Bactrim last week  4. Abdominal  pain and mild diarrhea -Likely related to Taxol  -I give her a prescription of Protonix today  -Improved afterTaxol dose reduction.  -Use Imodium as needed for diarrhea   5. G1 peripheral neuropathy, skin pigmentation, hands and feet -Stable. We'll continue to monitor closely.  6. Anemia, secondary  to chemotherapy -Stable. No need for transfusion.  Plan -continue chemo today and weekly  -RTC in 3 weeks   I spent about 30 minutes counseling the patient and her family members, total care was about 40 minutes.  [name]  12/21/2014

## 2014-12-21 NOTE — Patient Instructions (Signed)

## 2014-12-21 NOTE — Telephone Encounter (Signed)
per pof pt req to have chemo times chged-sent Dr Burr Medico email to adv of capped days 5/17 & 5/31-adv no available slots to move pt inf on those days-cld & left pt a message to adv of no availability to move time out later for those dates

## 2014-12-24 ENCOUNTER — Telehealth: Payer: Self-pay | Admitting: *Deleted

## 2014-12-24 NOTE — Telephone Encounter (Signed)
Per patient request I have moved appts to as late as possible. Patient aware on May appts, mailed calendar

## 2014-12-28 ENCOUNTER — Other Ambulatory Visit (HOSPITAL_BASED_OUTPATIENT_CLINIC_OR_DEPARTMENT_OTHER): Payer: BLUE CROSS/BLUE SHIELD

## 2014-12-28 ENCOUNTER — Ambulatory Visit (HOSPITAL_BASED_OUTPATIENT_CLINIC_OR_DEPARTMENT_OTHER): Payer: BLUE CROSS/BLUE SHIELD

## 2014-12-28 ENCOUNTER — Ambulatory Visit: Payer: BLUE CROSS/BLUE SHIELD

## 2014-12-28 DIAGNOSIS — C50311 Malignant neoplasm of lower-inner quadrant of right female breast: Secondary | ICD-10-CM

## 2014-12-28 DIAGNOSIS — Z5112 Encounter for antineoplastic immunotherapy: Secondary | ICD-10-CM | POA: Diagnosis not present

## 2014-12-28 DIAGNOSIS — Z95828 Presence of other vascular implants and grafts: Secondary | ICD-10-CM

## 2014-12-28 DIAGNOSIS — C50911 Malignant neoplasm of unspecified site of right female breast: Secondary | ICD-10-CM

## 2014-12-28 DIAGNOSIS — Z5111 Encounter for antineoplastic chemotherapy: Secondary | ICD-10-CM | POA: Diagnosis not present

## 2014-12-28 DIAGNOSIS — D6481 Anemia due to antineoplastic chemotherapy: Secondary | ICD-10-CM

## 2014-12-28 DIAGNOSIS — C7800 Secondary malignant neoplasm of unspecified lung: Principal | ICD-10-CM

## 2014-12-28 DIAGNOSIS — C7801 Secondary malignant neoplasm of right lung: Secondary | ICD-10-CM | POA: Diagnosis not present

## 2014-12-28 LAB — COMPREHENSIVE METABOLIC PANEL (CC13)
ALK PHOS: 59 U/L (ref 40–150)
ALT: 23 U/L (ref 0–55)
AST: 24 U/L (ref 5–34)
Albumin: 3.4 g/dL — ABNORMAL LOW (ref 3.5–5.0)
Anion Gap: 11 mEq/L (ref 3–11)
BUN: 8.6 mg/dL (ref 7.0–26.0)
CO2: 27 mEq/L (ref 22–29)
CREATININE: 0.7 mg/dL (ref 0.6–1.1)
Calcium: 9 mg/dL (ref 8.4–10.4)
Chloride: 104 mEq/L (ref 98–109)
EGFR: >90 mL/min/{1.73_m2} (ref >90)
Glucose: 91 mg/dl (ref 70–140)
Potassium: 3.5 mEq/L (ref 3.5–5.1)
Sodium: 143 mEq/L (ref 136–145)
Total Bilirubin: 0.4 mg/dL (ref 0.20–1.20)
Total Protein: 6.5 g/dL (ref 6.4–8.3)

## 2014-12-28 LAB — CBC WITH DIFFERENTIAL/PLATELET
BASO%: 0.5 % (ref 0.0–2.0)
Basophils Absolute: 0 10*3/uL (ref 0.0–0.1)
EOS%: 1.2 % (ref 0.0–7.0)
Eosinophils Absolute: 0.1 10*3/uL (ref 0.0–0.5)
HCT: 30 % — ABNORMAL LOW (ref 34.8–46.6)
HGB: 10 g/dL — ABNORMAL LOW (ref 11.6–15.9)
LYMPH%: 35.8 % (ref 14.0–49.7)
MCH: 31.3 pg (ref 25.1–34.0)
MCHC: 33.3 g/dL (ref 31.5–36.0)
MCV: 94 fL (ref 79.5–101.0)
MONO#: 0.5 10*3/uL (ref 0.1–0.9)
MONO%: 8.3 % (ref 0.0–14.0)
NEUT#: 3.3 10*3/uL (ref 1.5–6.5)
NEUT%: 54.2 % (ref 38.4–76.8)
Platelets: 293 10*3/uL (ref 145–400)
RBC: 3.19 10*6/uL — AB (ref 3.70–5.45)
RDW: 15.2 % — ABNORMAL HIGH (ref 11.2–14.5)
WBC: 6.1 10*3/uL (ref 3.9–10.3)
lymph#: 2.2 10*3/uL (ref 0.9–3.3)

## 2014-12-28 MED ORDER — SODIUM CHLORIDE 0.9 % IJ SOLN
10.0000 mL | INTRAMUSCULAR | Status: DC | PRN
  Administered 2014-12-28: 10 mL via INTRAVENOUS
  Filled 2014-12-28: qty 10

## 2014-12-28 MED ORDER — FAMOTIDINE IN NACL 20-0.9 MG/50ML-% IV SOLN
INTRAVENOUS | Status: AC
  Filled 2014-12-28: qty 50

## 2014-12-28 MED ORDER — DIPHENHYDRAMINE HCL 50 MG/ML IJ SOLN
INTRAMUSCULAR | Status: AC
  Filled 2014-12-28: qty 1

## 2014-12-28 MED ORDER — DIPHENHYDRAMINE HCL 50 MG/ML IJ SOLN
50.0000 mg | Freq: Once | INTRAMUSCULAR | Status: AC
  Administered 2014-12-28: 50 mg via INTRAVENOUS

## 2014-12-28 MED ORDER — SODIUM CHLORIDE 0.9 % IV SOLN
420.0000 mg | Freq: Once | INTRAVENOUS | Status: AC
  Administered 2014-12-28: 420 mg via INTRAVENOUS
  Filled 2014-12-28: qty 14

## 2014-12-28 MED ORDER — SODIUM CHLORIDE 0.9 % IV SOLN
Freq: Once | INTRAVENOUS | Status: AC
  Administered 2014-12-28: 15:00:00 via INTRAVENOUS

## 2014-12-28 MED ORDER — METHYLPREDNISOLONE SODIUM SUCC 40 MG IJ SOLR
INTRAMUSCULAR | Status: AC
  Filled 2014-12-28: qty 1

## 2014-12-28 MED ORDER — HEPARIN SOD (PORK) LOCK FLUSH 100 UNIT/ML IV SOLN
500.0000 [IU] | Freq: Once | INTRAVENOUS | Status: AC | PRN
  Administered 2014-12-28: 500 [IU]
  Filled 2014-12-28: qty 5

## 2014-12-28 MED ORDER — METHYLPREDNISOLONE SODIUM SUCC 40 MG IJ SOLR
40.0000 mg | Freq: Once | INTRAMUSCULAR | Status: AC
  Administered 2014-12-28: 40 mg via INTRAVENOUS

## 2014-12-28 MED ORDER — PACLITAXEL CHEMO INJECTION 300 MG/50ML
60.0000 mg/m2 | Freq: Once | INTRAVENOUS | Status: AC
  Administered 2014-12-28: 132 mg via INTRAVENOUS
  Filled 2014-12-28: qty 22

## 2014-12-28 MED ORDER — ACETAMINOPHEN 325 MG PO TABS
ORAL_TABLET | ORAL | Status: AC
  Filled 2014-12-28: qty 2

## 2014-12-28 MED ORDER — SODIUM CHLORIDE 0.9 % IJ SOLN
10.0000 mL | INTRAMUSCULAR | Status: DC | PRN
  Administered 2014-12-28: 10 mL
  Filled 2014-12-28: qty 10

## 2014-12-28 MED ORDER — FAMOTIDINE IN NACL 20-0.9 MG/50ML-% IV SOLN
20.0000 mg | Freq: Once | INTRAVENOUS | Status: AC
  Administered 2014-12-28: 20 mg via INTRAVENOUS

## 2014-12-28 MED ORDER — TRASTUZUMAB CHEMO INJECTION 440 MG
6.0000 mg/kg | Freq: Once | INTRAVENOUS | Status: AC
  Administered 2014-12-28: 588 mg via INTRAVENOUS
  Filled 2014-12-28: qty 28

## 2014-12-28 MED ORDER — ACETAMINOPHEN 325 MG PO TABS
650.0000 mg | ORAL_TABLET | Freq: Once | ORAL | Status: AC
  Administered 2014-12-28: 650 mg via ORAL

## 2014-12-28 MED ORDER — SODIUM CHLORIDE 0.9 % IV SOLN
Freq: Once | INTRAVENOUS | Status: AC
  Administered 2014-12-28: 15:00:00 via INTRAVENOUS
  Filled 2014-12-28: qty 4

## 2014-12-28 NOTE — Patient Instructions (Signed)
[facility] Discharge Instructions for Patients Receiving Chemotherapy  Today you received the following chemotherapy agents Paclitaxel/Herceptin/Perjeta.   To help prevent nausea and vomiting after your treatment, we encourage you to take your nausea medication as directed.    If you develop nausea and vomiting that is not controlled by your nausea medication, call the clinic.   BELOW ARE SYMPTOMS THAT SHOULD BE REPORTED IMMEDIATELY:  *FEVER GREATER THAN 100.5 F  *CHILLS WITH OR WITHOUT FEVER  NAUSEA AND VOMITING THAT IS NOT CONTROLLED WITH YOUR NAUSEA MEDICATION  *UNUSUAL SHORTNESS OF BREATH  *UNUSUAL BRUISING OR BLEEDING  TENDERNESS IN MOUTH AND THROAT WITH OR WITHOUT PRESENCE OF ULCERS  *URINARY PROBLEMS  *BOWEL PROBLEMS  UNUSUAL RASH Items with * indicate a potential emergency and should be followed up as soon as possible.  Feel free to call the clinic you have any questions or concerns. The clinic phone number is [phone].  Please show the [facility] at check-in to the Emergency Department and triage nurse.

## 2014-12-28 NOTE — Patient Instructions (Signed)

## 2015-01-04 ENCOUNTER — Other Ambulatory Visit (HOSPITAL_BASED_OUTPATIENT_CLINIC_OR_DEPARTMENT_OTHER): Payer: BLUE CROSS/BLUE SHIELD

## 2015-01-04 ENCOUNTER — Ambulatory Visit: Payer: BLUE CROSS/BLUE SHIELD

## 2015-01-04 ENCOUNTER — Ambulatory Visit (HOSPITAL_BASED_OUTPATIENT_CLINIC_OR_DEPARTMENT_OTHER): Payer: BLUE CROSS/BLUE SHIELD

## 2015-01-04 VITALS — BP 135/82 | HR 100 | Temp 98.4°F | Resp 18

## 2015-01-04 DIAGNOSIS — C50311 Malignant neoplasm of lower-inner quadrant of right female breast: Secondary | ICD-10-CM | POA: Diagnosis not present

## 2015-01-04 DIAGNOSIS — C50911 Malignant neoplasm of unspecified site of right female breast: Secondary | ICD-10-CM

## 2015-01-04 DIAGNOSIS — C7801 Secondary malignant neoplasm of right lung: Secondary | ICD-10-CM | POA: Diagnosis not present

## 2015-01-04 DIAGNOSIS — Z5111 Encounter for antineoplastic chemotherapy: Secondary | ICD-10-CM | POA: Diagnosis not present

## 2015-01-04 DIAGNOSIS — C7800 Secondary malignant neoplasm of unspecified lung: Principal | ICD-10-CM

## 2015-01-04 DIAGNOSIS — C7802 Secondary malignant neoplasm of left lung: Secondary | ICD-10-CM | POA: Diagnosis not present

## 2015-01-04 DIAGNOSIS — Z95828 Presence of other vascular implants and grafts: Secondary | ICD-10-CM

## 2015-01-04 DIAGNOSIS — D6481 Anemia due to antineoplastic chemotherapy: Secondary | ICD-10-CM | POA: Diagnosis not present

## 2015-01-04 LAB — COMPREHENSIVE METABOLIC PANEL (CC13)
ALBUMIN: 3.6 g/dL (ref 3.5–5.0)
ALT: 23 U/L (ref 0–55)
AST: 22 U/L (ref 5–34)
Alkaline Phosphatase: 59 U/L (ref 40–150)
Anion Gap: 12 mEq/L — ABNORMAL HIGH (ref 3–11)
BUN: 13.5 mg/dL (ref 7.0–26.0)
CO2: 24 meq/L (ref 22–29)
Calcium: 9.2 mg/dL (ref 8.4–10.4)
Chloride: 105 mEq/L (ref 98–109)
Creatinine: 0.8 mg/dL (ref 0.6–1.1)
Glucose: 107 mg/dl (ref 70–140)
Potassium: 3.5 mEq/L (ref 3.5–5.1)
Sodium: 141 mEq/L (ref 136–145)
TOTAL PROTEIN: 6.7 g/dL (ref 6.4–8.3)
Total Bilirubin: 0.43 mg/dL (ref 0.20–1.20)

## 2015-01-04 LAB — CBC WITH DIFFERENTIAL/PLATELET
BASO%: 0.4 % (ref 0.0–2.0)
BASOS ABS: 0 10*3/uL (ref 0.0–0.1)
EOS%: 1.5 % (ref 0.0–7.0)
Eosinophils Absolute: 0.1 10*3/uL (ref 0.0–0.5)
HEMATOCRIT: 31.6 % — AB (ref 34.8–46.6)
HGB: 10.4 g/dL — ABNORMAL LOW (ref 11.6–15.9)
LYMPH%: 32.3 % (ref 14.0–49.7)
MCH: 30.8 pg (ref 25.1–34.0)
MCHC: 32.9 g/dL (ref 31.5–36.0)
MCV: 93.5 fL (ref 79.5–101.0)
MONO#: 0.5 10*3/uL (ref 0.1–0.9)
MONO%: 7.4 % (ref 0.0–14.0)
NEUT#: 4 10*3/uL (ref 1.5–6.5)
NEUT%: 58.4 % (ref 38.4–76.8)
Platelets: 331 10*3/uL (ref 145–400)
RBC: 3.38 10*6/uL — AB (ref 3.70–5.45)
RDW: 15.4 % — AB (ref 11.2–14.5)
WBC: 6.9 10*3/uL (ref 3.9–10.3)
lymph#: 2.2 10*3/uL (ref 0.9–3.3)

## 2015-01-04 MED ORDER — FAMOTIDINE IN NACL 20-0.9 MG/50ML-% IV SOLN
INTRAVENOUS | Status: AC
  Filled 2015-01-04: qty 50

## 2015-01-04 MED ORDER — FAMOTIDINE IN NACL 20-0.9 MG/50ML-% IV SOLN
20.0000 mg | Freq: Once | INTRAVENOUS | Status: AC
  Administered 2015-01-04: 20 mg via INTRAVENOUS

## 2015-01-04 MED ORDER — SODIUM CHLORIDE 0.9 % IV SOLN
Freq: Once | INTRAVENOUS | Status: AC
  Administered 2015-01-04: 16:00:00 via INTRAVENOUS

## 2015-01-04 MED ORDER — PACLITAXEL CHEMO INJECTION 300 MG/50ML: 70.0000 mg/m2 | Freq: Once | INTRAVENOUS | Status: DC

## 2015-01-04 MED ORDER — SODIUM CHLORIDE 0.9 % IJ SOLN
10.0000 mL | INTRAMUSCULAR | Status: DC | PRN
  Administered 2015-01-04: 10 mL via INTRAVENOUS
  Filled 2015-01-04: qty 10

## 2015-01-04 MED ORDER — PACLITAXEL CHEMO INJECTION 300 MG/50ML
60.0000 mg/m2 | Freq: Once | INTRAVENOUS | Status: AC
  Administered 2015-01-04: 132 mg via INTRAVENOUS
  Filled 2015-01-04: qty 22

## 2015-01-04 MED ORDER — SODIUM CHLORIDE 0.9 % IJ SOLN
10.0000 mL | INTRAMUSCULAR | Status: DC | PRN
  Administered 2015-01-04: 10 mL
  Filled 2015-01-04: qty 10

## 2015-01-04 MED ORDER — DIPHENHYDRAMINE HCL 50 MG/ML IJ SOLN
50.0000 mg | Freq: Once | INTRAMUSCULAR | Status: AC
  Administered 2015-01-04: 50 mg via INTRAVENOUS

## 2015-01-04 MED ORDER — HEPARIN SOD (PORK) LOCK FLUSH 100 UNIT/ML IV SOLN
500.0000 [IU] | Freq: Once | INTRAVENOUS | Status: AC | PRN
  Administered 2015-01-04: 500 [IU]
  Filled 2015-01-04: qty 5

## 2015-01-04 MED ORDER — SODIUM CHLORIDE 0.9 % IV SOLN
Freq: Once | INTRAVENOUS | Status: AC
  Administered 2015-01-04: 16:00:00 via INTRAVENOUS
  Filled 2015-01-04: qty 4

## 2015-01-04 MED ORDER — DIPHENHYDRAMINE HCL 50 MG/ML IJ SOLN
INTRAMUSCULAR | Status: AC
  Filled 2015-01-04: qty 1

## 2015-01-04 NOTE — Patient Instructions (Signed)
[facility] Discharge Instructions for Patients Receiving Chemotherapy  Today you received the following chemotherapy agents Taxol   To help prevent nausea and vomiting after your treatment, we encourage you to take your nausea medication as directed.   If you develop nausea and vomiting that is not controlled by your nausea medication, call the clinic.   BELOW ARE SYMPTOMS THAT SHOULD BE REPORTED IMMEDIATELY:  *FEVER GREATER THAN 100.5 F  *CHILLS WITH OR WITHOUT FEVER  NAUSEA AND VOMITING THAT IS NOT CONTROLLED WITH YOUR NAUSEA MEDICATION  *UNUSUAL SHORTNESS OF BREATH  *UNUSUAL BRUISING OR BLEEDING  TENDERNESS IN MOUTH AND THROAT WITH OR WITHOUT PRESENCE OF ULCERS  *URINARY PROBLEMS  *BOWEL PROBLEMS  UNUSUAL RASH Items with * indicate a potential emergency and should be followed up as soon as possible.  Feel free to call the clinic you have any questions or concerns. The clinic phone number is [phone].  Please show the CHEMO ALERT CARD at check-in to the Emergency Department and triage nurse.   

## 2015-01-04 NOTE — Patient Instructions (Signed)

## 2015-01-05 ENCOUNTER — Telehealth: Payer: Self-pay | Admitting: Hematology

## 2015-01-05 NOTE — Telephone Encounter (Signed)
Added MD visit for 5.31..per staff msg MD ok to see pt in chemo.Marland KitchenMarland KitchenMarland Kitchen

## 2015-01-11 ENCOUNTER — Ambulatory Visit: Payer: BLUE CROSS/BLUE SHIELD

## 2015-01-11 ENCOUNTER — Ambulatory Visit (HOSPITAL_BASED_OUTPATIENT_CLINIC_OR_DEPARTMENT_OTHER): Payer: BLUE CROSS/BLUE SHIELD | Admitting: Hematology

## 2015-01-11 ENCOUNTER — Other Ambulatory Visit (HOSPITAL_BASED_OUTPATIENT_CLINIC_OR_DEPARTMENT_OTHER): Payer: BLUE CROSS/BLUE SHIELD

## 2015-01-11 ENCOUNTER — Ambulatory Visit (HOSPITAL_BASED_OUTPATIENT_CLINIC_OR_DEPARTMENT_OTHER): Payer: BLUE CROSS/BLUE SHIELD

## 2015-01-11 VITALS — BP 149/84 | HR 91 | Temp 98.3°F | Resp 18 | Ht 70.0 in | Wt 210.6 lb

## 2015-01-11 DIAGNOSIS — C7800 Secondary malignant neoplasm of unspecified lung: Principal | ICD-10-CM

## 2015-01-11 DIAGNOSIS — C50911 Malignant neoplasm of unspecified site of right female breast: Secondary | ICD-10-CM | POA: Diagnosis not present

## 2015-01-11 DIAGNOSIS — C7801 Secondary malignant neoplasm of right lung: Secondary | ICD-10-CM | POA: Diagnosis not present

## 2015-01-11 DIAGNOSIS — Z95828 Presence of other vascular implants and grafts: Secondary | ICD-10-CM

## 2015-01-11 DIAGNOSIS — R197 Diarrhea, unspecified: Secondary | ICD-10-CM

## 2015-01-11 DIAGNOSIS — C773 Secondary and unspecified malignant neoplasm of axilla and upper limb lymph nodes: Secondary | ICD-10-CM | POA: Diagnosis not present

## 2015-01-11 DIAGNOSIS — C50311 Malignant neoplasm of lower-inner quadrant of right female breast: Secondary | ICD-10-CM | POA: Diagnosis not present

## 2015-01-11 DIAGNOSIS — D6481 Anemia due to antineoplastic chemotherapy: Secondary | ICD-10-CM

## 2015-01-11 DIAGNOSIS — L609 Nail disorder, unspecified: Secondary | ICD-10-CM

## 2015-01-11 DIAGNOSIS — Z5111 Encounter for antineoplastic chemotherapy: Secondary | ICD-10-CM

## 2015-01-11 DIAGNOSIS — Z171 Estrogen receptor negative status [ER-]: Secondary | ICD-10-CM

## 2015-01-11 DIAGNOSIS — C7802 Secondary malignant neoplasm of left lung: Secondary | ICD-10-CM | POA: Diagnosis not present

## 2015-01-11 DIAGNOSIS — R109 Unspecified abdominal pain: Secondary | ICD-10-CM

## 2015-01-11 DIAGNOSIS — R21 Rash and other nonspecific skin eruption: Secondary | ICD-10-CM

## 2015-01-11 LAB — CBC WITH DIFFERENTIAL/PLATELET
BASO%: 0.8 % (ref 0.0–2.0)
BASOS ABS: 0 10*3/uL (ref 0.0–0.1)
EOS ABS: 0.1 10*3/uL (ref 0.0–0.5)
EOS%: 1.5 % (ref 0.0–7.0)
HCT: 33 % — ABNORMAL LOW (ref 34.8–46.6)
HEMOGLOBIN: 11 g/dL — AB (ref 11.6–15.9)
LYMPH#: 2 10*3/uL (ref 0.9–3.3)
LYMPH%: 31.8 % (ref 14.0–49.7)
MCH: 30.9 pg (ref 25.1–34.0)
MCHC: 33.4 g/dL (ref 31.5–36.0)
MCV: 92.6 fL (ref 79.5–101.0)
MONO#: 0.5 10*3/uL (ref 0.1–0.9)
MONO%: 8.5 % (ref 0.0–14.0)
NEUT%: 57.4 % (ref 38.4–76.8)
NEUTROS ABS: 3.7 10*3/uL (ref 1.5–6.5)
Platelets: 353 10*3/uL (ref 145–400)
RBC: 3.56 10*6/uL — ABNORMAL LOW (ref 3.70–5.45)
RDW: 16.3 % — ABNORMAL HIGH (ref 11.2–14.5)
WBC: 6.4 10*3/uL (ref 3.9–10.3)

## 2015-01-11 LAB — COMPREHENSIVE METABOLIC PANEL (CC13)
ALK PHOS: 58 U/L (ref 40–150)
ALT: 19 U/L (ref 0–55)
AST: 17 U/L (ref 5–34)
Albumin: 3.5 g/dL (ref 3.5–5.0)
Anion Gap: 10 mEq/L (ref 3–11)
BUN: 9.6 mg/dL (ref 7.0–26.0)
CALCIUM: 8.7 mg/dL (ref 8.4–10.4)
CHLORIDE: 106 meq/L (ref 98–109)
CO2: 24 mEq/L (ref 22–29)
Creatinine: 0.7 mg/dL (ref 0.6–1.1)
EGFR: >90 mL/min/{1.73_m2} (ref >90)
Glucose: 114 mg/dl (ref 70–140)
POTASSIUM: 3.5 meq/L (ref 3.5–5.1)
SODIUM: 140 meq/L (ref 136–145)
Total Bilirubin: 0.37 mg/dL (ref 0.20–1.20)
Total Protein: 6.7 g/dL (ref 6.4–8.3)

## 2015-01-11 MED ORDER — HEPARIN SOD (PORK) LOCK FLUSH 100 UNIT/ML IV SOLN
500.0000 [IU] | Freq: Once | INTRAVENOUS | Status: AC | PRN
  Administered 2015-01-11: 500 [IU]
  Filled 2015-01-11: qty 5

## 2015-01-11 MED ORDER — DIPHENHYDRAMINE HCL 50 MG/ML IJ SOLN
INTRAMUSCULAR | Status: AC
  Filled 2015-01-11: qty 1

## 2015-01-11 MED ORDER — FAMOTIDINE IN NACL 20-0.9 MG/50ML-% IV SOLN
INTRAVENOUS | Status: AC
  Filled 2015-01-11: qty 50

## 2015-01-11 MED ORDER — SODIUM CHLORIDE 0.9 % IJ SOLN
10.0000 mL | INTRAMUSCULAR | Status: DC | PRN
  Administered 2015-01-11: 10 mL via INTRAVENOUS
  Filled 2015-01-11: qty 10

## 2015-01-11 MED ORDER — PACLITAXEL CHEMO INJECTION 300 MG/50ML
60.0000 mg/m2 | Freq: Once | INTRAVENOUS | Status: AC
  Administered 2015-01-11: 132 mg via INTRAVENOUS
  Filled 2015-01-11: qty 22

## 2015-01-11 MED ORDER — FAMOTIDINE IN NACL 20-0.9 MG/50ML-% IV SOLN
20.0000 mg | Freq: Once | INTRAVENOUS | Status: AC
  Administered 2015-01-11: 20 mg via INTRAVENOUS

## 2015-01-11 MED ORDER — SODIUM CHLORIDE 0.9 % IJ SOLN
10.0000 mL | INTRAMUSCULAR | Status: DC | PRN
  Administered 2015-01-11: 10 mL
  Filled 2015-01-11: qty 10

## 2015-01-11 MED ORDER — SODIUM CHLORIDE 0.9 % IV SOLN
Freq: Once | INTRAVENOUS | Status: AC
  Administered 2015-01-11: 17:00:00 via INTRAVENOUS
  Filled 2015-01-11: qty 4

## 2015-01-11 MED ORDER — SODIUM CHLORIDE 0.9 % IV SOLN
Freq: Once | INTRAVENOUS | Status: AC
  Administered 2015-01-11: 16:00:00 via INTRAVENOUS

## 2015-01-11 MED ORDER — DIPHENHYDRAMINE HCL 50 MG/ML IJ SOLN
50.0000 mg | Freq: Once | INTRAMUSCULAR | Status: AC
  Administered 2015-01-11: 50 mg via INTRAVENOUS

## 2015-01-11 NOTE — Progress Notes (Signed)
[facility]  Follow up note   Patient Care Team: [name], DO as PCP - General (Family Medicine) [name], DO (Family Medicine) Holley Bouche, NP as Nurse Practitioner (Nurse Practitioner) Stark Klein, MD as Consulting Physician (General Surgery) Arloa Koh, MD as Consulting Physician (Radiation Oncology) [name], MD as Consulting Physician (Hematology)  CHIEF COMPLAINTS Follow up metastatic breast cancer  Oncology History   Breast cancer 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   Staging form: Breast, AJCC 7th Edition     Clinical stage from 07/22/2014: Stage IV (T3, N1, M1) - Unsigned       Breast cancer 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   07/02/2014 Mammogram Mammogram showed a 2cm right beast mass and a 1.8cm right axillary node. MRI breast on 07/16/2014 showed 7cm R breast lesion and 4.4cm r axillary node    07/02/2014 Imaging CT CAP: a 4.7cm mass in LUL lung and a 2.1cm mas in RML, and a small nodule in RUL, suspecious for metastasis     07/09/2014 Initial Diagnosis right IDA with b/l lung lesions, ER-/PR-/HER2+   07/09/2014 Initial Biopsy US guided right breast mass and axillary node biopsy showed IDA, and DCIS, ER-/PR-/HER2+   07/26/2014 Pathologic Stage Left lung mass by IR, path revealed high grade carcinoma, morphology similar to breast tumor biopsy, TTF(-), NapsinA(-), ER(-)   08/04/2014 -  Chemotherapy weekly Paclitaxel 49m/m2, trastuzumab and pertuzumab every 3 weeks   10/04/2014 Imaging Interval decrease in the right axillary lymphadenopathy. Bilateral pulmonary lesions with left hilar lymphadenopathy also markedly decreased in the interval. The left hilar lymphadenopathy has resolved.   12/20/2014 Imaging restaging CT showed stable disease, no new lesions     CURRENT THERAPY: weekly Paclitaxel 87mm2 (changed to 70 mg/m from cycle 16 due to abdominal cramps, 60 mg/m since cycles 22nd due to neuropathy), trastuzumab and pertuzumab every 3 weeks, started on  08/04/2014  INTERVAL HISTROY   [place] follow[place] and 24th dose of Taxol. Her peripheral neuropathy on fingers has been getting slightly worse lately, she is able to perform all ADLs, with slight difficulty in buttoning and writing, Taxol dose was reduced again to 60 mg/m since cycle 22. She otherwise is tolerating the treatment well. She has stable mild fatigue, able to work full-time and performed routine activities, no pain or other complaints.  MEDICAL HISTORY:  Past Medical History  Diagnosis Date  . Breast cancer   . Pneumonia     hx of pneumonia 08/2013   . GERD (gastroesophageal reflux disease)     during pregnancy   . Breast cancer     SURGICAL HISTORY: Past Surgical History  Procedure Laterality Date  . Cesarean section    . Essure tubal ligation    . Cholecystectomy    . Wisdom tooth extraction    . Portacath placement Left 07/21/2014    Procedure: INSERTION PORT-A-CATH;  Surgeon: FaStark KleinMD;  Location: [facility];  Service: General;  Laterality: Left;    SOCIAL HISTORY: History   Social History  . Marital Status: Married    Spouse Name: N/A    Number of Children:  she has 2 daughters at the age of [age]  . Years of Education: N/A   Occupational History  .  works as maFreight forwarderor a caFilm/video editor   Social History Main Topics  . Smoking status: Never Smoker   . Smokeless tobacco: Not on file  . Alcohol Use: Yes  . Drug Use: No  .  Sexual Activity: No    FAMILY HISTORY: Family History  Problem Relation Age of Onset  . Breast cancer Mother 84  . Liver cancer Father 17  . Breast cancer Maternal Aunt 36  . Prostate cancer Maternal Grandfather   . Liver cancer Paternal Grandmother   . Prostate cancer Paternal Grandfather      GENETICS: 08/30/2014 BreastNext panel was negative. 17 genes including BRCA1, BRCA2, were negative for mutations.   ALLERGIES:  is allergic to aspirin and codeine.  MEDICATIONS:  Current Outpatient  Prescriptions  Medication Sig Dispense Refill  . albuterol (PROVENTIL HFA;VENTOLIN HFA) 108 (90 BASE) MCG/ACT inhaler Inhale 2 puffs into the lungs every 4 (four) hours as needed for wheezing or shortness of breath.    . Camphor-Eucalyptus-Menthol (VICKS VAPORUB EX) Apply 1 application topically at bedtime as needed (sinus). Applies under nose, throat and on chest.    . cetirizine (ZYRTEC) 10 MG tablet Take 10 mg by mouth daily as needed for allergies.     . clindamycin (CLINDAGEL) 1 % gel Apply topically to affected areas BID. 60 g 3  . diphenhydrAMINE (SOMINEX) 25 MG tablet Take 50 mg by mouth at bedtime as needed for sleep.    Marland Kitchen guaiFENesin (ROBITUSSIN) 100 MG/5ML liquid Take 200 mg by mouth 3 (three) times daily as needed for cough.    . hydrocortisone 2.5 % cream APPLY TOPICALLY TO THE AFFECTED AREA TWICE DAILY 454 g 0  . HYDROCORTISONE, TOPICAL, 2 % LOTN Apply topically to affected areas BID. 29.6 mL 1  . ibuprofen (ADVIL,MOTRIN) 200 MG tablet Take 400-800 mg by mouth every 6 (six) hours as needed for headache or moderate pain.    Marland Kitchen lidocaine-prilocaine (EMLA) cream APPLY TO PORTACATH 1 AND 1/2- 2 HOURS PRIOR TO PROCEDURE AS NEEDED 30 g 1  . loperamide (IMODIUM A-D) 2 MG tablet Take 1 tablet (2 mg total) by mouth 4 (four) times daily as needed for diarrhea or loose stools. 30 tablet 0  . metaxalone (SKELAXIN) 800 MG tablet Take 800 mg by mouth 3 (three) times daily as needed for muscle spasms.    . ondansetron (ZOFRAN) 8 MG tablet Take 1 tablet (8 mg total) by mouth every 8 (eight) hours as needed for nausea or vomiting. 30 tablet 3  . oxyCODONE-acetaminophen (ROXICET) 5-325 MG per tablet Take 1-2 tablets by mouth every 4 (four) hours as needed for severe pain. (Patient not taking: Reported on 12/21/2014) 30 tablet 0  . pantoprazole (PROTONIX) 20 MG tablet Take 1 tablet (20 mg total) by mouth daily. 30 tablet 3  . Pseudoeph-Doxylamine-DM-APAP (NYQUIL PO) Take 30 mLs by mouth 2 (two) times daily  as needed (cold/allergies).     . zolpidem (AMBIEN) 5 MG tablet Take 1 tablet (5 mg total) by mouth at bedtime as needed for sleep. (Patient not taking: Reported on 11/28/2014) 20 tablet 0   No current facility-administered medications for this visit.   Facility-Administered Medications Ordered in Other Visits  Medication Dose Route Frequency Provider Last Rate Last Dose  . sodium chloride 0.9 % injection 10 mL  10 mL Intravenous PRN [name], MD   10 mL at 01/11/15 1504    REVIEW OF SYSTEMS:   Constitutional: Denies fevers, chills or abnormal night sweats, (+) malaise  Eyes: Denies blurriness of vision, double vision or watery eyes Ears, nose, mouth, throat, and face: Denies mucositis or sore throat Respiratory: (+) productive cough, no dyspnea or wheezes Cardiovascular: Denies palpitation, chest discomfort or lower extremity swelling Gastrointestinal:  Denies nausea, heartburn or change in bowel habits Skin:(+) rashes  Lymphatics: Denies new lymphadenopathy or easy bruising Neurological:Denies numbness, tingling or new weaknesses Behavioral/Psych: Mood is stable, no new changes  All other systems were reviewed with the patient and are negative.  PHYSICAL EXAMINATION: ECOG PERFORMANCE STATUS: 1 BP 149/84 mmHg  Pulse 91  Temp(Src) 98.3 F (36.8 C) (Oral)  Resp 18  Ht 5' 10"  (1.778 m)  Wt 210 lb 9.6 oz (95.528 kg)  BMI 30.22 kg/m2  SpO2 100% GENERAL:alert, no distress and comfortable SKIN: skin color, texture, turgor are normal, no rashes or significant lesions EYES: normal, conjunctiva are pink and non-injected, sclera clear OROPHARYNX:no exudate, no erythema and lips, buccal mucosa, and tongue normal  NECK: supple, thyroid normal size, non-tender, without nodularity LYMPH:  no palpable lymphadenopathy in the cervical, axillary or inguinal LUNGS: clear to auscultation and percussion with normal breathing effort HEART: regular rate & rhythm and no murmurs and no lower extremity  edema ABDOMEN:abdomen soft, non-tender and normal bowel sounds Musculoskeletal:no cyanosis of digits and no clubbing  PSYCH: alert & oriented x 3 with fluent speech NEURO: no focal motor/sensory deficits Breasts: Breast inspection showed them to be symmetrical with no skin change or nipple discharge. Palpation of the right breast showed a 2.0X2.0cm mass in the lower inner quadrant, which is softer than before, and the previously palpable right axillary node is not palpable anymore except some fullness. Left breast  and axilla revealed no obvious mass that I could appreciate. Skin: (+) Skin pigmentation on hands and feet.  a few acne-like rash on face and neck. No new skin rashes. (+)  pigmentation on fingernails, and bulging of fingernails and thickening of soft tissue under nailbed.   LABORATORY DATA:  I have reviewed the data as listed CBC Latest Ref Rng 01/11/2015 01/04/2015 12/28/2014  WBC 3.9 - 10.3 10e3/uL 6.4 6.9 6.1  Hemoglobin 11.6 - 15.9 g/dL 11.0(L) 10.4(L) 10.0(L)  Hematocrit 34.8 - 46.6 % 33.0(L) 31.6(L) 30.0(L)  Platelets 145 - 400 10e3/uL 353 331 293    CMP Latest Ref Rng 01/04/2015 12/28/2014 12/21/2014  Glucose 70 - 140 mg/dl 107 91 95  BUN 7.0 - 26.0 mg/dL 13.5 8.6 8.1  Creatinine 0.6 - 1.1 mg/dL 0.8 0.7 0.8  Sodium 136 - 145 mEq/L 141 143 141  Potassium 3.5 - 5.1 mEq/L 3.5 3.5 3.5  Chloride 96 - 112 mmol/L - - -  CO2 22 - 29 mEq/L 24 27 25   Calcium 8.4 - 10.4 mg/dL 9.2 9.0 9.2  Total Protein 6.4 - 8.3 g/dL 6.7 6.5 6.9  Total Bilirubin 0.20 - 1.20 mg/dL 0.43 0.40 0.45  Alkaline Phos 40 - 150 U/L 59 59 63  AST 5 - 34 U/L 22 24 19   ALT 0 - 55 U/L 23 23 23      PATHOLOGY REPORT 07/09/2014 #1 breast, right needle core biopsy more anterior -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 45)  Lung, biopsy, Left 07/26/2014 - HIGH GRADE CARCINOMA, SEE COMMENT. Microscopic Comment The carcinoma demonstrates the following immunophenotype: TTF-1 - negative expression. Napsin A- negative expression. CK5/6 - focal, moderate expression. estrogen receptor - negative expression. GCDFP- negative expression. The recent breast biopsy demonstrating Her2 amplified high grade invasive mammary carcinoma is noted ([id number]). Although the immunophenotype of the current case is non-sepcific, it  strongly argues against primary lung adenocarcinoma. However, on re-review, the morphology of the current case is essentially identical to the primary mammary carcinoma. In lieu of further immunophenotyping, the tumor will be submitted for Her2 testing and the remaining tissue will be reserved for additional ancillary tumor testing. The results of the Her2 testing will be reported in an addendum. The case was discussed with Dr Burr Medico on 07/28/2015 and 07/29/2015 HER2 POSITIVE    RADIOGRAPHIC STUDIES: CT CAP 10/04/2014 IMPRESSION: Interval decrease in the right axillary lymphadenopathy.  Bilateral pulmonary lesions with left hilar lymphadenopathy also markedly decreased in the interval. The left hilar lymphadenopathy has resolved.  3 millimeter nonobstructing right renal stone.   CT chest, abdomen and pelvis with contrast 12/20/2014 IMPRESSION: 1. Today's study is very similar to the prior examination from 10/04/2014, demonstrating stable size of metastatic lesions in the right middle lobe and left upper lobe, and no new evidence of metastatic disease in the thorax. 2. 2.0 x 0.9 cm lesion in the medial aspect of the right breast is similar to prior examination. No residual right axillary lymphadenopathy. 3. As with prior examinations, there is no evidence of metastatic disease to the abdomen or pelvis. 4. 3 mm nonobstructive calculus in the lower pole  collecting system of the right kidney.   ECHO 10/19/2014 Impressions:  - Normal biventricular size and systolic function. Abnormal relaxation with normal filling pressures.  Normal strain parameteres: Global longitudinal strain: -17.5% Lateral S prime: 12 cm/sec.  ASSESSMENT & PLAN:  45 year old African-American female, premenopausal, without significant past medical history, presented with palpable right breast mass and right axillary mass.   1. 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 -I reviewed her second restaging CT scan with her and her husband, and personally reviewed the images with them. It showed stable disease, no new lesions.  -We discussed the possibility of breast surgery, if she has complete response in her pulmonary metastasis on the next restaging scan. -I'll obtain a PET scan in 2 months to evaluate her response and tumor activity in the residual lung lesions -continue weekly paclitaxel, trastuzumab and pertuzumab every 3 weeks. I will likely to continue this regiment for 6-9 months. If she has excellent response, then I'll change to Herceptin maintenance therapy. -G1-2 abdominal cramps and diarrhea, likely secondary to Taxol, improved after increasing premed dexamethasone back to 20 mg. She also had mild reaction to Herceptin or pertuzumab, on solu-medro 80m iv as premeds.  -She had normal echo at baseline, repeated one normal. Will monitor ECHO  every 3 months when she is on dual anti-HER-2 therapy, next echo due on 01/19/2015. - due to her worsening abdominal pain and neuropathy, I have decreased her weekly Taxol dosed to 60 mg/m, will watch her neuropathy carefully.  -Lab reviewed, adequate for treatment, we'll proceed cycle 24 Taxol today, cycle 8 Herceptin and  Perjeta next week  2. Skin rashes on face and scalp, likely related to antibody therapy  -The rash on upper chest much improved with topical steroids - continue topical steroids and clindamycin gel as needed   3. Nail changes -secondary to Taxol. -continue warm water with viniger soaking once daily, holding ice bag he hands during chemotherapy -she finished a course of Bactrim last week  4. Abdominal  pain and mild diarrhea -Likely related to Taxol  -  I give her a prescription of Protonix today  -Improved afterTaxol dose reduction.  -Use Imodium as needed for diarrhea   5. G2 peripheral neuropathy, skin pigmentation, hands and feet -Stable. We'll continue to monitor closely.  6. Anemia, secondary to chemotherapy -Stable. No need for transfusion.  Plan -continue chemo today and weekly  -RTC in 2 weeks   I spent about 20 minutes counseling the patient and her family members, total care was about 25 minutes.  [name]  01/11/2015

## 2015-01-11 NOTE — Patient Instructions (Signed)

## 2015-01-11 NOTE — Patient Instructions (Addendum)
[facility] Discharge Instructions for Patients Receiving Chemotherapy  Today you received the following chemotherapy agents taxol  To help prevent nausea and vomiting after your treatment, we encourage you to take your nausea medication as directed by Dr Burr Medico   If you develop nausea and vomiting that is not controlled by your nausea medication, call the clinic.   BELOW ARE SYMPTOMS THAT SHOULD BE REPORTED IMMEDIATELY:  *FEVER GREATER THAN 100.5 F  *CHILLS WITH OR WITHOUT FEVER  NAUSEA AND VOMITING THAT IS NOT CONTROLLED WITH YOUR NAUSEA MEDICATION  *UNUSUAL SHORTNESS OF BREATH  *UNUSUAL BRUISING OR BLEEDING  TENDERNESS IN MOUTH AND THROAT WITH OR WITHOUT PRESENCE OF ULCERS  *URINARY PROBLEMS  *BOWEL PROBLEMS  UNUSUAL RASH Items with * indicate a potential emergency and should be followed up as soon as possible.  Feel free to call the clinic you have any questions or concerns. The clinic phone number is [phone].  Please show the [facility] at check-in to the Emergency Department and triage nurse

## 2015-01-16 ENCOUNTER — Encounter: Payer: Self-pay | Admitting: Hematology

## 2015-01-17 ENCOUNTER — Telehealth: Payer: Self-pay | Admitting: Hematology

## 2015-01-17 NOTE — Telephone Encounter (Signed)
per pof added appt pt to get updated sch 6/7 appt

## 2015-01-18 ENCOUNTER — Other Ambulatory Visit (HOSPITAL_BASED_OUTPATIENT_CLINIC_OR_DEPARTMENT_OTHER): Payer: BLUE CROSS/BLUE SHIELD

## 2015-01-18 ENCOUNTER — Ambulatory Visit (HOSPITAL_BASED_OUTPATIENT_CLINIC_OR_DEPARTMENT_OTHER): Payer: BLUE CROSS/BLUE SHIELD

## 2015-01-18 ENCOUNTER — Ambulatory Visit: Payer: BLUE CROSS/BLUE SHIELD

## 2015-01-18 VITALS — BP 147/76 | HR 97 | Temp 98.3°F | Resp 18

## 2015-01-18 DIAGNOSIS — Z5111 Encounter for antineoplastic chemotherapy: Secondary | ICD-10-CM

## 2015-01-18 DIAGNOSIS — C50311 Malignant neoplasm of lower-inner quadrant of right female breast: Secondary | ICD-10-CM

## 2015-01-18 DIAGNOSIS — D6481 Anemia due to antineoplastic chemotherapy: Secondary | ICD-10-CM | POA: Diagnosis not present

## 2015-01-18 DIAGNOSIS — C7800 Secondary malignant neoplasm of unspecified lung: Principal | ICD-10-CM

## 2015-01-18 DIAGNOSIS — C50911 Malignant neoplasm of unspecified site of right female breast: Secondary | ICD-10-CM

## 2015-01-18 DIAGNOSIS — C7801 Secondary malignant neoplasm of right lung: Secondary | ICD-10-CM | POA: Diagnosis not present

## 2015-01-18 DIAGNOSIS — Z5112 Encounter for antineoplastic immunotherapy: Secondary | ICD-10-CM

## 2015-01-18 DIAGNOSIS — Z95828 Presence of other vascular implants and grafts: Secondary | ICD-10-CM

## 2015-01-18 LAB — COMPREHENSIVE METABOLIC PANEL (CC13)
ALT: 19 U/L (ref 0–55)
ANION GAP: 8 meq/L (ref 3–11)
AST: 19 U/L (ref 5–34)
Albumin: 3.6 g/dL (ref 3.5–5.0)
Alkaline Phosphatase: 62 U/L (ref 40–150)
BUN: 12.4 mg/dL (ref 7.0–26.0)
CO2: 26 meq/L (ref 22–29)
Calcium: 9 mg/dL (ref 8.4–10.4)
Chloride: 106 mEq/L (ref 98–109)
Creatinine: 0.7 mg/dL (ref 0.6–1.1)
GLUCOSE: 101 mg/dL (ref 70–140)
Potassium: 3.6 mEq/L (ref 3.5–5.1)
Sodium: 140 mEq/L (ref 136–145)
TOTAL PROTEIN: 6.7 g/dL (ref 6.4–8.3)
Total Bilirubin: 0.44 mg/dL (ref 0.20–1.20)

## 2015-01-18 LAB — CBC WITH DIFFERENTIAL/PLATELET
BASO%: 0.3 % (ref 0.0–2.0)
BASOS ABS: 0 10*3/uL (ref 0.0–0.1)
EOS%: 1.4 % (ref 0.0–7.0)
Eosinophils Absolute: 0.1 10*3/uL (ref 0.0–0.5)
HCT: 32.4 % — ABNORMAL LOW (ref 34.8–46.6)
HGB: 10.7 g/dL — ABNORMAL LOW (ref 11.6–15.9)
LYMPH#: 2.5 10*3/uL (ref 0.9–3.3)
LYMPH%: 35 % (ref 14.0–49.7)
MCH: 30.8 pg (ref 25.1–34.0)
MCHC: 33 g/dL (ref 31.5–36.0)
MCV: 93.4 fL (ref 79.5–101.0)
MONO#: 0.5 10*3/uL (ref 0.1–0.9)
MONO%: 6.6 % (ref 0.0–14.0)
NEUT#: 4.1 10*3/uL (ref 1.5–6.5)
NEUT%: 56.7 % (ref 38.4–76.8)
Platelets: 297 10*3/uL (ref 145–400)
RBC: 3.47 10*6/uL — AB (ref 3.70–5.45)
RDW: 14.9 % — AB (ref 11.2–14.5)
WBC: 7.2 10*3/uL (ref 3.9–10.3)

## 2015-01-18 MED ORDER — SODIUM CHLORIDE 0.9 % IJ SOLN
10.0000 mL | INTRAMUSCULAR | Status: DC | PRN
  Administered 2015-01-18: 10 mL
  Filled 2015-01-18: qty 10

## 2015-01-18 MED ORDER — METHYLPREDNISOLONE SODIUM SUCC 40 MG IJ SOLR
INTRAMUSCULAR | Status: AC
  Filled 2015-01-18: qty 1

## 2015-01-18 MED ORDER — DIPHENHYDRAMINE HCL 50 MG/ML IJ SOLN
50.0000 mg | Freq: Once | INTRAMUSCULAR | Status: AC
  Administered 2015-01-18: 50 mg via INTRAVENOUS

## 2015-01-18 MED ORDER — SODIUM CHLORIDE 0.9 % IJ SOLN
10.0000 mL | INTRAMUSCULAR | Status: DC | PRN
  Administered 2015-01-18: 10 mL via INTRAVENOUS
  Filled 2015-01-18: qty 10

## 2015-01-18 MED ORDER — METHYLPREDNISOLONE SODIUM SUCC 40 MG IJ SOLR
40.0000 mg | Freq: Once | INTRAMUSCULAR | Status: AC
  Administered 2015-01-18: 40 mg via INTRAVENOUS

## 2015-01-18 MED ORDER — SODIUM CHLORIDE 0.9 % IV SOLN
Freq: Once | INTRAVENOUS | Status: AC
  Administered 2015-01-18: 15:00:00 via INTRAVENOUS
  Filled 2015-01-18: qty 4

## 2015-01-18 MED ORDER — ACETAMINOPHEN 325 MG PO TABS
650.0000 mg | ORAL_TABLET | Freq: Once | ORAL | Status: AC
  Administered 2015-01-18: 650 mg via ORAL

## 2015-01-18 MED ORDER — HEPARIN SOD (PORK) LOCK FLUSH 100 UNIT/ML IV SOLN
500.0000 [IU] | Freq: Once | INTRAVENOUS | Status: DC
  Filled 2015-01-18: qty 5

## 2015-01-18 MED ORDER — PACLITAXEL CHEMO INJECTION 300 MG/50ML
60.0000 mg/m2 | Freq: Once | INTRAVENOUS | Status: AC
  Administered 2015-01-18: 132 mg via INTRAVENOUS
  Filled 2015-01-18: qty 22

## 2015-01-18 MED ORDER — ACETAMINOPHEN 325 MG PO TABS
ORAL_TABLET | ORAL | Status: AC
  Filled 2015-01-18: qty 2

## 2015-01-18 MED ORDER — SODIUM CHLORIDE 0.9 % IV SOLN
Freq: Once | INTRAVENOUS | Status: AC
  Administered 2015-01-18: 14:00:00 via INTRAVENOUS

## 2015-01-18 MED ORDER — FAMOTIDINE IN NACL 20-0.9 MG/50ML-% IV SOLN
20.0000 mg | Freq: Once | INTRAVENOUS | Status: AC
  Administered 2015-01-18: 20 mg via INTRAVENOUS

## 2015-01-18 MED ORDER — FAMOTIDINE IN NACL 20-0.9 MG/50ML-% IV SOLN
INTRAVENOUS | Status: AC
  Filled 2015-01-18: qty 50

## 2015-01-18 MED ORDER — SODIUM CHLORIDE 0.9 % IV SOLN
420.0000 mg | Freq: Once | INTRAVENOUS | Status: AC
  Administered 2015-01-18: 420 mg via INTRAVENOUS
  Filled 2015-01-18: qty 14

## 2015-01-18 MED ORDER — TRASTUZUMAB CHEMO INJECTION 440 MG
6.0000 mg/kg | Freq: Once | INTRAVENOUS | Status: AC
  Administered 2015-01-18: 588 mg via INTRAVENOUS
  Filled 2015-01-18: qty 28

## 2015-01-18 MED ORDER — HEPARIN SOD (PORK) LOCK FLUSH 100 UNIT/ML IV SOLN
500.0000 [IU] | Freq: Once | INTRAVENOUS | Status: AC | PRN
  Administered 2015-01-18: 500 [IU]
  Filled 2015-01-18: qty 5

## 2015-01-18 MED ORDER — DIPHENHYDRAMINE HCL 50 MG/ML IJ SOLN
INTRAMUSCULAR | Status: AC
  Filled 2015-01-18: qty 1

## 2015-01-18 NOTE — Patient Instructions (Signed)

## 2015-01-18 NOTE — Patient Instructions (Signed)
[facility] Discharge Instructions for Patients Receiving Chemotherapy  Today you received the following chemotherapy agents Herceptin/Perjeta/Taxol To help prevent nausea and vomiting after your treatment, we encourage you to take your nausea medication as prescribed.  If you develop nausea and vomiting that is not controlled by your nausea medication, call the clinic.   BELOW ARE SYMPTOMS THAT SHOULD BE REPORTED IMMEDIATELY:  *FEVER GREATER THAN 100.5 F  *CHILLS WITH OR WITHOUT FEVER  NAUSEA AND VOMITING THAT IS NOT CONTROLLED WITH YOUR NAUSEA MEDICATION  *UNUSUAL SHORTNESS OF BREATH  *UNUSUAL BRUISING OR BLEEDING  TENDERNESS IN MOUTH AND THROAT WITH OR WITHOUT PRESENCE OF ULCERS  *URINARY PROBLEMS  *BOWEL PROBLEMS  UNUSUAL RASH Items with * indicate a potential emergency and should be followed up as soon as possible.  Feel free to call the clinic you have any questions or concerns. The clinic phone number is [phone].  Please show the [facility] at check-in to the Emergency Department and triage nurse.

## 2015-01-21 ENCOUNTER — Ambulatory Visit: Payer: BLUE CROSS/BLUE SHIELD | Admitting: Hematology

## 2015-01-25 ENCOUNTER — Ambulatory Visit: Payer: BLUE CROSS/BLUE SHIELD

## 2015-01-25 ENCOUNTER — Other Ambulatory Visit: Payer: BLUE CROSS/BLUE SHIELD

## 2015-01-25 ENCOUNTER — Other Ambulatory Visit (HOSPITAL_BASED_OUTPATIENT_CLINIC_OR_DEPARTMENT_OTHER): Payer: BLUE CROSS/BLUE SHIELD

## 2015-01-25 ENCOUNTER — Other Ambulatory Visit: Payer: Self-pay | Admitting: *Deleted

## 2015-01-25 ENCOUNTER — Ambulatory Visit (HOSPITAL_BASED_OUTPATIENT_CLINIC_OR_DEPARTMENT_OTHER): Payer: BLUE CROSS/BLUE SHIELD

## 2015-01-25 ENCOUNTER — Telehealth: Payer: Self-pay | Admitting: Hematology and Oncology

## 2015-01-25 DIAGNOSIS — Z5111 Encounter for antineoplastic chemotherapy: Secondary | ICD-10-CM

## 2015-01-25 DIAGNOSIS — C7802 Secondary malignant neoplasm of left lung: Secondary | ICD-10-CM | POA: Diagnosis not present

## 2015-01-25 DIAGNOSIS — C7800 Secondary malignant neoplasm of unspecified lung: Principal | ICD-10-CM

## 2015-01-25 DIAGNOSIS — C7801 Secondary malignant neoplasm of right lung: Secondary | ICD-10-CM

## 2015-01-25 DIAGNOSIS — C50911 Malignant neoplasm of unspecified site of right female breast: Secondary | ICD-10-CM

## 2015-01-25 DIAGNOSIS — C773 Secondary and unspecified malignant neoplasm of axilla and upper limb lymph nodes: Secondary | ICD-10-CM

## 2015-01-25 DIAGNOSIS — Z95828 Presence of other vascular implants and grafts: Secondary | ICD-10-CM

## 2015-01-25 LAB — COMPREHENSIVE METABOLIC PANEL (CC13)
ALK PHOS: 61 U/L (ref 40–150)
ALT: 21 U/L (ref 0–55)
ANION GAP: 12 meq/L — AB (ref 3–11)
AST: 19 U/L (ref 5–34)
Albumin: 3.4 g/dL — ABNORMAL LOW (ref 3.5–5.0)
BILIRUBIN TOTAL: 0.42 mg/dL (ref 0.20–1.20)
BUN: 12.9 mg/dL (ref 7.0–26.0)
CO2: 25 meq/L (ref 22–29)
Calcium: 8.9 mg/dL (ref 8.4–10.4)
Chloride: 104 mEq/L (ref 98–109)
Creatinine: 0.8 mg/dL (ref 0.6–1.1)
GLUCOSE: 108 mg/dL (ref 70–140)
Potassium: 3.5 mEq/L (ref 3.5–5.1)
SODIUM: 142 meq/L (ref 136–145)
TOTAL PROTEIN: 6.5 g/dL (ref 6.4–8.3)

## 2015-01-25 LAB — CBC WITH DIFFERENTIAL/PLATELET
BASO%: 0.4 % (ref 0.0–2.0)
Basophils Absolute: 0 10*3/uL (ref 0.0–0.1)
EOS ABS: 0.1 10*3/uL (ref 0.0–0.5)
EOS%: 1.9 % (ref 0.0–7.0)
HCT: 31.9 % — ABNORMAL LOW (ref 34.8–46.6)
HEMOGLOBIN: 10.4 g/dL — AB (ref 11.6–15.9)
LYMPH%: 29.5 % (ref 14.0–49.7)
MCH: 30.6 pg (ref 25.1–34.0)
MCHC: 32.6 g/dL (ref 31.5–36.0)
MCV: 93.8 fL (ref 79.5–101.0)
MONO#: 0.5 10*3/uL (ref 0.1–0.9)
MONO%: 7.6 % (ref 0.0–14.0)
NEUT%: 60.6 % (ref 38.4–76.8)
NEUTROS ABS: 4.2 10*3/uL (ref 1.5–6.5)
PLATELETS: 276 10*3/uL (ref 145–400)
RBC: 3.4 10*6/uL — ABNORMAL LOW (ref 3.70–5.45)
RDW: 14.8 % — ABNORMAL HIGH (ref 11.2–14.5)
WBC: 6.9 10*3/uL (ref 3.9–10.3)
lymph#: 2.1 10*3/uL (ref 0.9–3.3)

## 2015-01-25 MED ORDER — FAMOTIDINE IN NACL 20-0.9 MG/50ML-% IV SOLN
20.0000 mg | Freq: Once | INTRAVENOUS | Status: AC
  Administered 2015-01-25: 20 mg via INTRAVENOUS

## 2015-01-25 MED ORDER — SODIUM CHLORIDE 0.9 % IJ SOLN
10.0000 mL | INTRAMUSCULAR | Status: DC | PRN
  Administered 2015-01-25: 10 mL
  Filled 2015-01-25: qty 10

## 2015-01-25 MED ORDER — PACLITAXEL CHEMO INJECTION 300 MG/50ML
60.0000 mg/m2 | Freq: Once | INTRAVENOUS | Status: AC
  Administered 2015-01-25: 132 mg via INTRAVENOUS
  Filled 2015-01-25: qty 22

## 2015-01-25 MED ORDER — DIPHENHYDRAMINE HCL 50 MG/ML IJ SOLN
50.0000 mg | Freq: Once | INTRAMUSCULAR | Status: AC
  Administered 2015-01-25: 50 mg via INTRAVENOUS

## 2015-01-25 MED ORDER — SODIUM CHLORIDE 0.9 % IJ SOLN
10.0000 mL | INTRAMUSCULAR | Status: DC | PRN
  Administered 2015-01-25: 10 mL via INTRAVENOUS
  Filled 2015-01-25: qty 10

## 2015-01-25 MED ORDER — DIPHENHYDRAMINE HCL 50 MG/ML IJ SOLN
INTRAMUSCULAR | Status: AC
  Filled 2015-01-25: qty 1

## 2015-01-25 MED ORDER — FAMOTIDINE IN NACL 20-0.9 MG/50ML-% IV SOLN
INTRAVENOUS | Status: AC
  Filled 2015-01-25: qty 50

## 2015-01-25 MED ORDER — SODIUM CHLORIDE 0.9 % IV SOLN
Freq: Once | INTRAVENOUS | Status: AC
  Administered 2015-01-25: 15:00:00 via INTRAVENOUS
  Filled 2015-01-25: qty 4

## 2015-01-25 MED ORDER — SODIUM CHLORIDE 0.9 % IV SOLN
Freq: Once | INTRAVENOUS | Status: AC
  Administered 2015-01-25: 15:00:00 via INTRAVENOUS

## 2015-01-25 MED ORDER — HEPARIN SOD (PORK) LOCK FLUSH 100 UNIT/ML IV SOLN
500.0000 [IU] | Freq: Once | INTRAVENOUS | Status: AC | PRN
  Administered 2015-01-25: 500 [IU]
  Filled 2015-01-25: qty 5

## 2015-01-25 NOTE — Telephone Encounter (Signed)
Appointment added 6/21 per pof and patient will get a new schedule in chemo

## 2015-01-25 NOTE — Patient Instructions (Signed)
[facility] Discharge Instructions for Patients Receiving Chemotherapy  Today you received the following chemotherapy agents Taxol  To help prevent nausea and vomiting after your treatment, we encourage you to take your nausea medication   If you develop nausea and vomiting that is not controlled by your nausea medication, call the clinic.   BELOW ARE SYMPTOMS THAT SHOULD BE REPORTED IMMEDIATELY:  *FEVER GREATER THAN 100.5 F  *CHILLS WITH OR WITHOUT FEVER  NAUSEA AND VOMITING THAT IS NOT CONTROLLED WITH YOUR NAUSEA MEDICATION  *UNUSUAL SHORTNESS OF BREATH  *UNUSUAL BRUISING OR BLEEDING  TENDERNESS IN MOUTH AND THROAT WITH OR WITHOUT PRESENCE OF ULCERS  *URINARY PROBLEMS  *BOWEL PROBLEMS  UNUSUAL RASH Items with * indicate a potential emergency and should be followed up as soon as possible.  Feel free to call the clinic you have any questions or concerns. The clinic phone number is [phone].  Please show the CHEMO ALERT CARD at check-in to the Emergency Department and triage nurse.   

## 2015-01-25 NOTE — Patient Instructions (Signed)

## 2015-02-01 ENCOUNTER — Ambulatory Visit (HOSPITAL_BASED_OUTPATIENT_CLINIC_OR_DEPARTMENT_OTHER): Payer: BLUE CROSS/BLUE SHIELD

## 2015-02-01 ENCOUNTER — Encounter: Payer: Self-pay | Admitting: *Deleted

## 2015-02-01 ENCOUNTER — Other Ambulatory Visit (HOSPITAL_BASED_OUTPATIENT_CLINIC_OR_DEPARTMENT_OTHER): Payer: BLUE CROSS/BLUE SHIELD

## 2015-02-01 ENCOUNTER — Telehealth: Payer: Self-pay | Admitting: Hematology

## 2015-02-01 ENCOUNTER — Ambulatory Visit: Payer: BLUE CROSS/BLUE SHIELD

## 2015-02-01 ENCOUNTER — Telehealth: Payer: Self-pay | Admitting: *Deleted

## 2015-02-01 ENCOUNTER — Ambulatory Visit (HOSPITAL_BASED_OUTPATIENT_CLINIC_OR_DEPARTMENT_OTHER): Payer: BLUE CROSS/BLUE SHIELD | Admitting: Hematology

## 2015-02-01 ENCOUNTER — Encounter: Payer: Self-pay | Admitting: Hematology

## 2015-02-01 VITALS — BP 146/72 | HR 105 | Temp 98.6°F | Resp 18 | Ht 70.0 in | Wt 212.9 lb

## 2015-02-01 DIAGNOSIS — R21 Rash and other nonspecific skin eruption: Secondary | ICD-10-CM

## 2015-02-01 DIAGNOSIS — C7800 Secondary malignant neoplasm of unspecified lung: Principal | ICD-10-CM

## 2015-02-01 DIAGNOSIS — C7801 Secondary malignant neoplasm of right lung: Secondary | ICD-10-CM

## 2015-02-01 DIAGNOSIS — D6481 Anemia due to antineoplastic chemotherapy: Secondary | ICD-10-CM

## 2015-02-01 DIAGNOSIS — G620 Drug-induced polyneuropathy: Secondary | ICD-10-CM

## 2015-02-01 DIAGNOSIS — R197 Diarrhea, unspecified: Secondary | ICD-10-CM

## 2015-02-01 DIAGNOSIS — L609 Nail disorder, unspecified: Secondary | ICD-10-CM

## 2015-02-01 DIAGNOSIS — C7802 Secondary malignant neoplasm of left lung: Secondary | ICD-10-CM | POA: Diagnosis not present

## 2015-02-01 DIAGNOSIS — Z171 Estrogen receptor negative status [ER-]: Secondary | ICD-10-CM

## 2015-02-01 DIAGNOSIS — Z5111 Encounter for antineoplastic chemotherapy: Secondary | ICD-10-CM

## 2015-02-01 DIAGNOSIS — C50911 Malignant neoplasm of unspecified site of right female breast: Secondary | ICD-10-CM

## 2015-02-01 DIAGNOSIS — Z95828 Presence of other vascular implants and grafts: Secondary | ICD-10-CM

## 2015-02-01 DIAGNOSIS — R109 Unspecified abdominal pain: Secondary | ICD-10-CM

## 2015-02-01 LAB — CBC WITH DIFFERENTIAL/PLATELET
BASO%: 0.4 % (ref 0.0–2.0)
Basophils Absolute: 0 10*3/uL (ref 0.0–0.1)
EOS%: 1.4 % (ref 0.0–7.0)
Eosinophils Absolute: 0.1 10*3/uL (ref 0.0–0.5)
HCT: 32.8 % — ABNORMAL LOW (ref 34.8–46.6)
HGB: 10.8 g/dL — ABNORMAL LOW (ref 11.6–15.9)
LYMPH#: 2.3 10*3/uL (ref 0.9–3.3)
LYMPH%: 32.3 % (ref 14.0–49.7)
MCH: 30.3 pg (ref 25.1–34.0)
MCHC: 32.8 g/dL (ref 31.5–36.0)
MCV: 92.3 fL (ref 79.5–101.0)
MONO#: 0.5 10*3/uL (ref 0.1–0.9)
MONO%: 7.6 % (ref 0.0–14.0)
NEUT#: 4.1 10*3/uL (ref 1.5–6.5)
NEUT%: 58.3 % (ref 38.4–76.8)
Platelets: 296 10*3/uL (ref 145–400)
RBC: 3.55 10*6/uL — AB (ref 3.70–5.45)
RDW: 15.5 % — ABNORMAL HIGH (ref 11.2–14.5)
WBC: 7 10*3/uL (ref 3.9–10.3)

## 2015-02-01 LAB — COMPREHENSIVE METABOLIC PANEL (CC13)
ALT: 23 U/L (ref 0–55)
AST: 21 U/L (ref 5–34)
Albumin: 3.6 g/dL (ref 3.5–5.0)
Alkaline Phosphatase: 60 U/L (ref 40–150)
Anion Gap: 7 mEq/L (ref 3–11)
BUN: 9.3 mg/dL (ref 7.0–26.0)
CO2: 28 meq/L (ref 22–29)
CREATININE: 0.7 mg/dL (ref 0.6–1.1)
Calcium: 8.9 mg/dL (ref 8.4–10.4)
Chloride: 105 mEq/L (ref 98–109)
Glucose: 114 mg/dl (ref 70–140)
Potassium: 3.4 mEq/L — ABNORMAL LOW (ref 3.5–5.1)
Sodium: 140 mEq/L (ref 136–145)
TOTAL PROTEIN: 6.8 g/dL (ref 6.4–8.3)
Total Bilirubin: 0.47 mg/dL (ref 0.20–1.20)

## 2015-02-01 MED ORDER — FAMOTIDINE IN NACL 20-0.9 MG/50ML-% IV SOLN
INTRAVENOUS | Status: AC
  Filled 2015-02-01: qty 50

## 2015-02-01 MED ORDER — SODIUM CHLORIDE 0.9 % IJ SOLN
10.0000 mL | INTRAMUSCULAR | Status: DC | PRN
  Administered 2015-02-01: 10 mL
  Filled 2015-02-01: qty 10

## 2015-02-01 MED ORDER — ACETAMINOPHEN 325 MG PO TABS
ORAL_TABLET | ORAL | Status: AC
  Filled 2015-02-01: qty 2

## 2015-02-01 MED ORDER — TRASTUZUMAB CHEMO INJECTION 440 MG: 6.0000 mg/kg | Freq: Once | INTRAVENOUS | Status: DC

## 2015-02-01 MED ORDER — SODIUM CHLORIDE 0.9 % IV SOLN: 420.0000 mg | Freq: Once | INTRAVENOUS | Status: DC

## 2015-02-01 MED ORDER — SODIUM CHLORIDE 0.9 % IV SOLN
Freq: Once | INTRAVENOUS | Status: AC
  Administered 2015-02-01: 17:00:00 via INTRAVENOUS
  Filled 2015-02-01: qty 4

## 2015-02-01 MED ORDER — METHYLPREDNISOLONE SODIUM SUCC 40 MG IJ SOLR
INTRAMUSCULAR | Status: AC
  Filled 2015-02-01: qty 1

## 2015-02-01 MED ORDER — LORAZEPAM 1 MG PO TABS: 1.0000 mg | ORAL_TABLET | Freq: Once | ORAL | Status: DC

## 2015-02-01 MED ORDER — DIPHENHYDRAMINE HCL 50 MG/ML IJ SOLN
INTRAMUSCULAR | Status: AC
  Filled 2015-02-01: qty 1

## 2015-02-01 MED ORDER — DIPHENHYDRAMINE HCL 50 MG/ML IJ SOLN
50.0000 mg | Freq: Once | INTRAMUSCULAR | Status: AC
  Administered 2015-02-01: 50 mg via INTRAVENOUS

## 2015-02-01 MED ORDER — HEPARIN SOD (PORK) LOCK FLUSH 100 UNIT/ML IV SOLN
500.0000 [IU] | Freq: Once | INTRAVENOUS | Status: AC | PRN
  Administered 2015-02-01: 500 [IU]
  Filled 2015-02-01: qty 5

## 2015-02-01 MED ORDER — SODIUM CHLORIDE 0.9 % IV SOLN
Freq: Once | INTRAVENOUS | Status: AC
  Administered 2015-02-01: 16:00:00 via INTRAVENOUS

## 2015-02-01 MED ORDER — ACETAMINOPHEN 325 MG PO TABS: 650.0000 mg | ORAL_TABLET | Freq: Once | ORAL | Status: DC

## 2015-02-01 MED ORDER — METHYLPREDNISOLONE SODIUM SUCC 40 MG IJ SOLR: 40.0000 mg | Freq: Once | INTRAMUSCULAR | Status: DC

## 2015-02-01 MED ORDER — FAMOTIDINE IN NACL 20-0.9 MG/50ML-% IV SOLN
20.0000 mg | Freq: Once | INTRAVENOUS | Status: AC
  Administered 2015-02-01: 20 mg via INTRAVENOUS

## 2015-02-01 MED ORDER — SODIUM CHLORIDE 0.9 % IJ SOLN
10.0000 mL | INTRAMUSCULAR | Status: DC | PRN
  Administered 2015-02-01: 10 mL via INTRAVENOUS
  Filled 2015-02-01: qty 10

## 2015-02-01 MED ORDER — PACLITAXEL CHEMO INJECTION 300 MG/50ML
60.0000 mg/m2 | Freq: Once | INTRAVENOUS | Status: AC
  Administered 2015-02-01: 132 mg via INTRAVENOUS
  Filled 2015-02-01: qty 22

## 2015-02-01 NOTE — Patient Instructions (Signed)
[facility] Discharge Instructions for Patients Receiving Chemotherapy  Today you received the following chemotherapy agents herceptin and perjeta   To help prevent nausea and vomiting after your treatment, we encourage you to take your nausea medication if needed.   If you develop nausea and vomiting that is not controlled by your nausea medication, call the clinic.   BELOW ARE SYMPTOMS THAT SHOULD BE REPORTED IMMEDIATELY:  *FEVER GREATER THAN 100.5 F  *CHILLS WITH OR WITHOUT FEVER  NAUSEA AND VOMITING THAT IS NOT CONTROLLED WITH YOUR NAUSEA MEDICATION  *UNUSUAL SHORTNESS OF BREATH  *UNUSUAL BRUISING OR BLEEDING  TENDERNESS IN MOUTH AND THROAT WITH OR WITHOUT PRESENCE OF ULCERS  *URINARY PROBLEMS  *BOWEL PROBLEMS  UNUSUAL RASH Items with * indicate a potential emergency and should be followed up as soon as possible.  Feel free to call the clinic you have any questions or concerns. The clinic phone number is [phone].  Please show the [facility] at check-in to the Emergency Department and triage nurse.

## 2015-02-01 NOTE — Progress Notes (Signed)
[place]  Follow up note   Patient Care Team: [name], DO as PCP - General (Family Medicine) [name], DO (Family Medicine) Holley Bouche, NP as Nurse Practitioner (Nurse Practitioner) Stark Klein, MD as Consulting Physician (General Surgery) Arloa Koh, MD as Consulting Physician (Radiation Oncology) [name], MD as Consulting Physician (Hematology)  CHIEF COMPLAINTS Follow up metastatic breast cancer  Oncology History   Breast cancer 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   Staging form: Breast, AJCC 7th Edition     Clinical stage from 07/22/2014: Stage IV (T3, N1, M1) - Unsigned       Breast cancer 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   07/02/2014 Mammogram Mammogram showed a 2cm right beast mass and a 1.8cm right axillary node. MRI breast on 07/16/2014 showed 7cm R breast lesion and 4.4cm r axillary node    07/02/2014 Imaging CT CAP: a 4.7cm mass in LUL lung and a 2.1cm mas in RML, and a small nodule in RUL, suspecious for metastasis     07/09/2014 Initial Diagnosis right IDA with b/l lung lesions, ER-/PR-/HER2+   07/09/2014 Initial Biopsy US guided right breast mass and axillary node biopsy showed IDA, and DCIS, ER-/PR-/HER2+   07/26/2014 Pathologic Stage Left lung mass by IR, path revealed high grade carcinoma, morphology similar to breast tumor biopsy, TTF(-), NapsinA(-), ER(-)   08/04/2014 -  Chemotherapy weekly Paclitaxel 18m/m2, trastuzumab and pertuzumab every 3 weeks   10/04/2014 Imaging Interval decrease in the right axillary lymphadenopathy. Bilateral pulmonary lesions with left hilar lymphadenopathy also markedly decreased in the interval. The left hilar lymphadenopathy has resolved.   12/20/2014 Imaging restaging CT showed stable disease, no new lesions     CURRENT THERAPY: weekly Paclitaxel 890mm2 (changed to 70 mg/m from cycle 16 due to abdominal cramps, 60 mg/m since cycles 22nd due to neuropathy), trastuzumab and pertuzumab every 3 weeks, started on  08/04/2014  INTERVAL HISTROY   [name] [name] for follow-up and 27th dose of Taxol. She had a fall in her office yesterday, she tripped and fell to on the carpet. She had mild pain at the left hip and ankle, much better today. No bruise or other injury. Her peripheral neuropathy is stable, she does have some numbness on the toes and bottom of her feet, and she did wear loose Center yesterday. She otherwise is doing well, denies any pain, diarrhea, or other new symptoms.  MEDICAL HISTORY:  Past Medical History  Diagnosis Date  . Breast cancer   . Pneumonia     hx of pneumonia 08/2013   . GERD (gastroesophageal reflux disease)     during pregnancy   . Breast cancer     SURGICAL HISTORY: Past Surgical History  Procedure Laterality Date  . Cesarean section    . Essure tubal ligation    . Cholecystectomy    . Wisdom tooth extraction    . Portacath placement Left 07/21/2014    Procedure: INSERTION PORT-A-CATH;  Surgeon: FaStark KleinMD;  Location: [facility];  Service: General;  Laterality: Left;    SOCIAL HISTORY: History   Social History  . Marital Status: Married    Spouse Name: N/A    Number of Children:  she has 2 daughters at the age of [age]  . Years of Education: N/A   Occupational History  .  works as maFreight forwarderor a caFilm/video editor   Social History Main Topics  . Smoking status: Never Smoker   . Smokeless tobacco: Not on file  .  Alcohol Use: Yes  . Drug Use: No  . Sexual Activity: No    FAMILY HISTORY: Family History  Problem Relation Age of Onset  . Breast cancer Mother 64  . Liver cancer Father 65  . Breast cancer Maternal Aunt 36  . Prostate cancer Maternal Grandfather   . Liver cancer Paternal Grandmother   . Prostate cancer Paternal Grandfather      GENETICS: 08/30/2014 BreastNext panel was negative. 17 genes including BRCA1, BRCA2, were negative for mutations.   ALLERGIES:  is allergic to aspirin and codeine.  MEDICATIONS:  Current  Outpatient Prescriptions  Medication Sig Dispense Refill  . albuterol (PROVENTIL HFA;VENTOLIN HFA) 108 (90 BASE) MCG/ACT inhaler Inhale 2 puffs into the lungs every 4 (four) hours as needed for wheezing or shortness of breath.    . Camphor-Eucalyptus-Menthol (VICKS VAPORUB EX) Apply 1 application topically at bedtime as needed (sinus). Applies under nose, throat and on chest.    . cetirizine (ZYRTEC) 10 MG tablet Take 10 mg by mouth daily as needed for allergies.     . clindamycin (CLINDAGEL) 1 % gel Apply topically to affected areas BID. 60 g 3  . diphenhydrAMINE (SOMINEX) 25 MG tablet Take 50 mg by mouth at bedtime as needed for sleep.    Marland Kitchen guaiFENesin (ROBITUSSIN) 100 MG/5ML liquid Take 200 mg by mouth 3 (three) times daily as needed for cough.    . hydrocortisone 2.5 % cream APPLY TOPICALLY TO THE AFFECTED AREA TWICE DAILY 454 g 0  . HYDROCORTISONE, TOPICAL, 2 % LOTN Apply topically to affected areas BID. 29.6 mL 1  . ibuprofen (ADVIL,MOTRIN) 200 MG tablet Take 400-800 mg by mouth every 6 (six) hours as needed for headache or moderate pain.    Marland Kitchen lidocaine-prilocaine (EMLA) cream APPLY TO PORTACATH 1 AND 1/2- 2 HOURS PRIOR TO PROCEDURE AS NEEDED 30 g 1  . loperamide (IMODIUM A-D) 2 MG tablet Take 1 tablet (2 mg total) by mouth 4 (four) times daily as needed for diarrhea or loose stools. 30 tablet 0  . LORazepam (ATIVAN) 1 MG tablet Take 1 tablet (1 mg total) by mouth once. 2 tablet 0  . metaxalone (SKELAXIN) 800 MG tablet Take 800 mg by mouth 3 (three) times daily as needed for muscle spasms.    . ondansetron (ZOFRAN) 8 MG tablet Take 1 tablet (8 mg total) by mouth every 8 (eight) hours as needed for nausea or vomiting. 30 tablet 3  . oxyCODONE-acetaminophen (ROXICET) 5-325 MG per tablet Take 1-2 tablets by mouth every 4 (four) hours as needed for severe pain. 30 tablet 0  . pantoprazole (PROTONIX) 20 MG tablet Take 1 tablet (20 mg total) by mouth daily. 30 tablet 3  .  Pseudoeph-Doxylamine-DM-APAP (NYQUIL PO) Take 30 mLs by mouth 2 (two) times daily as needed (cold/allergies).     . zolpidem (AMBIEN) 5 MG tablet Take 1 tablet (5 mg total) by mouth at bedtime as needed for sleep. (Patient not taking: Reported on 11/28/2014) 20 tablet 0   No current facility-administered medications for this visit.   Facility-Administered Medications Ordered in Other Visits  Medication Dose Route Frequency Provider Last Rate Last Dose  . heparin lock flush 100 unit/mL  500 Units Intracatheter Once PRN [name], MD      . sodium chloride 0.9 % injection 10 mL  10 mL Intracatheter PRN [name], MD        REVIEW OF SYSTEMS:   Constitutional: Denies fevers, chills or abnormal night sweats, (+) malaise  Eyes: Denies blurriness of vision, double vision or watery eyes Ears, nose, mouth, throat, and face: Denies mucositis or sore throat Respiratory: (+) productive cough, no dyspnea or wheezes Cardiovascular: Denies palpitation, chest discomfort or lower extremity swelling Gastrointestinal:  Denies nausea, heartburn or change in bowel habits Skin:(+) rashes  Lymphatics: Denies new lymphadenopathy or easy bruising Neurological:Denies numbness, tingling or new weaknesses Behavioral/Psych: Mood is stable, no new changes  All other systems were reviewed with the patient and are negative.  PHYSICAL EXAMINATION: ECOG PERFORMANCE STATUS: 1 BP 146/72 mmHg  Pulse 105  Temp(Src) 98.6 F (37 C) (Oral)  Resp 18  Ht 5' 10"  (1.778 m)  Wt 212 lb 14.4 oz (96.571 kg)  BMI 30.55 kg/m2  SpO2 99% GENERAL:alert, no distress and comfortable SKIN: skin color, texture, turgor are normal, no rashes or significant lesions EYES: normal, conjunctiva are pink and non-injected, sclera clear OROPHARYNX:no exudate, no erythema and lips, buccal mucosa, and tongue normal  NECK: supple, thyroid normal size, non-tender, without nodularity LYMPH:  no palpable lymphadenopathy in the cervical, axillary or  inguinal LUNGS: clear to auscultation and percussion with normal breathing effort HEART: regular rate & rhythm and no murmurs and no lower extremity edema ABDOMEN:abdomen soft, non-tender and normal bowel sounds Musculoskeletal:no cyanosis of digits and no clubbing  PSYCH: alert & oriented x 3 with fluent speech NEURO: no focal motor/sensory deficits Breasts: Breast inspection showed them to be symmetrical with no skin change or nipple discharge. Palpation of the right breast showed a 2.0X2.0cm mass in the lower inner quadrant, which is softer than before, and the previously palpable right axillary node is not palpable anymore except some fullness. Left breast  and axilla revealed no obvious mass that I could appreciate. Skin: (+) Skin pigmentation on hands and feet.  a few acne-like rash on face and neck. No new skin rashes. (+)  pigmentation on fingernails, and bulging of fingernails and thickening of soft tissue under nailbed.   LABORATORY DATA:  I have reviewed the data as listed CBC Latest Ref Rng 02/01/2015 01/25/2015 01/18/2015  WBC 3.9 - 10.3 10e3/uL 7.0 6.9 7.2  Hemoglobin 11.6 - 15.9 g/dL 10.8(L) 10.4(L) 10.7(L)  Hematocrit 34.8 - 46.6 % 32.8(L) 31.9(L) 32.4(L)  Platelets 145 - 400 10e3/uL 296 276 297    CMP Latest Ref Rng 02/01/2015 01/25/2015 01/18/2015  Glucose 70 - 140 mg/dl 114 108 101  BUN 7.0 - 26.0 mg/dL 9.3 12.9 12.4  Creatinine 0.6 - 1.1 mg/dL 0.7 0.8 0.7  Sodium 136 - 145 mEq/L 140 142 140  Potassium 3.5 - 5.1 mEq/L 3.4(L) 3.5 3.6  Chloride 96 - 112 mmol/L - - -  CO2 22 - 29 mEq/L 28 25 26   Calcium 8.4 - 10.4 mg/dL 8.9 8.9 9.0  Total Protein 6.4 - 8.3 g/dL 6.8 6.5 6.7  Total Bilirubin 0.20 - 1.20 mg/dL 0.47 0.42 0.44  Alkaline Phos 40 - 150 U/L 60 61 62  AST 5 - 34 U/L 21 19 19   ALT 0 - 55 U/L 23 21 19      PATHOLOGY REPORT 07/09/2014 #1 breast, right needle core biopsy more anterior -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 45)  Lung, biopsy, Left 07/26/2014 - HIGH GRADE CARCINOMA, SEE COMMENT. Microscopic Comment The carcinoma demonstrates the following immunophenotype: TTF-1 - negative expression. Napsin A-  negative expression. CK5/6 - focal, moderate expression. estrogen receptor - negative expression. GCDFP- negative expression. The recent breast biopsy demonstrating Her2 amplified high grade invasive mammary carcinoma is noted ([id number]). Although the immunophenotype of the current case is non-sepcific, it strongly argues against primary lung adenocarcinoma. However, on re-review, the morphology of the current case is essentially identical to the primary mammary carcinoma. In lieu of further immunophenotyping, the tumor will be submitted for Her2 testing and the remaining tissue will be reserved for additional ancillary tumor testing. The results of the Her2 testing will be reported in an addendum. The case was discussed with Dr Burr Medico on 07/28/2015 and 07/29/2015 HER2 POSITIVE    RADIOGRAPHIC STUDIES: CT CAP 10/04/2014 IMPRESSION: Interval decrease in the right axillary lymphadenopathy.  Bilateral pulmonary lesions with left hilar lymphadenopathy also markedly decreased in the interval. The left hilar lymphadenopathy has resolved.  3 millimeter nonobstructing right renal stone.   CT chest, abdomen and pelvis with contrast 12/20/2014 IMPRESSION: 1. Today's study is very similar to the prior examination from 10/04/2014, demonstrating stable size of metastatic lesions in the right middle lobe and left upper lobe, and no new evidence of metastatic disease in the thorax. 2. 2.0 x 0.9 cm lesion in the medial aspect of the right breast is similar to prior examination. No residual right axillary lymphadenopathy. 3.  As with prior examinations, there is no evidence of metastatic disease to the abdomen or pelvis. 4. 3 mm nonobstructive calculus in the lower pole collecting system of the right kidney.   ECHO 10/19/2014 Impressions:  - Normal biventricular size and systolic function. Abnormal relaxation with normal filling pressures.  Normal strain parameteres: Global longitudinal strain: -17.5% Lateral S prime: 12 cm/sec.  ASSESSMENT & PLAN:  44 year old African-American female, premenopausal, without significant past medical history, presented with palpable right breast mass and right axillary mass.   1. 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 -I reviewed her second restaging CT scan with her and her husband, and personally reviewed the images with them. It showed stable disease, no new lesions.  -We discussed the possibility of breast surgery, if she has complete response in her pulmonary metastasis on the next restaging scan. -I'll obtain a PET scan in 2 months to evaluate her response and tumor activity in the residual lung lesions -continue weekly paclitaxel, trastuzumab and pertuzumab every 3 weeks. I will likely to continue this regiment for 6-9 months. If she has excellent response, then I'll change to Herceptin maintenance therapy. -G1-2 abdominal cramps and diarrhea, likely secondary to Taxol, improved after increasing premed dexamethasone back to 20 mg. She also had mild reaction to Herceptin or pertuzumab, on solu-medro 18m iv as premeds.  -She had normal echo at baseline, repeated one normal. Will monitor ECHO  every 3 months when she is on dual anti-HER-2 therapy, next echo due on 01/19/2015. - due to her worsening abdominal pain and neuropathy, I have decreased her weekly Taxol dosed to  60 mg/m, will watch her neuropathy carefully.  -Lab reviewed, adequate for treatment, we'll proceed cycle 27 Taxol today, cycle 8 Herceptin and Perjeta next week -restaging PET on 7/11, she did not tolerate PET last time, I gave her ativan prescription today   2. Skin rashes on face and scalp, likely related to antibody therapy  -The rash on upper chest  much improved with topical steroids - continue topical steroids and clindamycin gel as needed   3. Nail changes -secondary to Taxol. -continue warm water with viniger soaking once daily, holding ice bag he hands during chemotherapy -she finished a course of Bactrim last week  4. Abdominal  pain and mild diarrhea -Likely related to Taxol  -I give her a prescription of Protonix today  -Improved afterTaxol dose reduction.  -Use Imodium as needed for diarrhea   5. G2 peripheral neuropathy, skin pigmentation, hands and feet -Stable. We'll continue to monitor closely.  6. Anemia, secondary to chemotherapy -Stable. No need for transfusion.  Plan -continue chemo today and weekly  -restaging PET in 3 weeks, she will take ativan before PET, prescription was given to her today   I spent about 20 minutes counseling the patient and her family members, total care was about 25 minutes.  [name]  02/01/2015

## 2015-02-01 NOTE — Patient Instructions (Signed)

## 2015-02-01 NOTE — Telephone Encounter (Signed)
per pof to sch pt appt-sent MW emailt o sch trmt-sent Dr Burr Medico email to advise that no avaiable slot on 712 per pof-awaiting response

## 2015-02-01 NOTE — Telephone Encounter (Signed)
No other notes.   

## 2015-02-02 ENCOUNTER — Telehealth: Payer: Self-pay | Admitting: Hematology

## 2015-02-02 ENCOUNTER — Telehealth: Payer: Self-pay | Admitting: *Deleted

## 2015-02-02 NOTE — Telephone Encounter (Signed)
I have advised scheduler to move labs

## 2015-02-02 NOTE — Telephone Encounter (Signed)
Per staff message and POF I have scheduled appts. Advised scheduler of appts. JMW  

## 2015-02-02 NOTE — Telephone Encounter (Signed)
Returned patient's call re adjusting appointment times on 02/15/15. Lab/flush appointments moved closer to tx appointment per patient. Patient has new time for 02/15/15 @ 2:15 pm.

## 2015-02-02 NOTE — Telephone Encounter (Signed)
per pof to sch pt appt-sent Burr Medico  a staff message -per reply to sch @ 3:30 per Sharyn Lull too late-sent Burr Medico a messsage to advise

## 2015-02-03 ENCOUNTER — Telehealth: Payer: Self-pay | Admitting: Hematology

## 2015-02-03 NOTE — Telephone Encounter (Signed)
per pof to sch pt appt-cld & spoke to pt and adv of 7/12 appt time & date-pt understood

## 2015-02-03 NOTE — Addendum Note (Signed)
Addended by: [name] on: 02/03/2015 02:32 PM   Modules accepted: Orders

## 2015-02-05 ENCOUNTER — Emergency Department (HOSPITAL_COMMUNITY)
Admission: EM | Admit: 2015-02-05 | Discharge: 2015-02-05 | Disposition: A | Discharge status: Home / Self Care | Payer: BLUE CROSS/BLUE SHIELD | Attending: Emergency Medicine | Admitting: Emergency Medicine

## 2015-02-05 ENCOUNTER — Encounter (HOSPITAL_COMMUNITY): Payer: Self-pay | Admitting: Emergency Medicine

## 2015-02-05 DIAGNOSIS — K219 Gastro-esophageal reflux disease without esophagitis: Secondary | ICD-10-CM | POA: Diagnosis not present

## 2015-02-05 DIAGNOSIS — W1839XA Other fall on same level, initial encounter: Secondary | ICD-10-CM | POA: Insufficient documentation

## 2015-02-05 DIAGNOSIS — Y9389 Activity, other specified: Secondary | ICD-10-CM | POA: Insufficient documentation

## 2015-02-05 DIAGNOSIS — Z8701 Personal history of pneumonia (recurrent): Secondary | ICD-10-CM | POA: Insufficient documentation

## 2015-02-05 DIAGNOSIS — Y9279 Other farm location as the place of occurrence of the external cause: Secondary | ICD-10-CM | POA: Diagnosis not present

## 2015-02-05 DIAGNOSIS — C50919 Malignant neoplasm of unspecified site of unspecified female breast: Secondary | ICD-10-CM | POA: Insufficient documentation

## 2015-02-05 DIAGNOSIS — H578 Other specified disorders of eye and adnexa: Secondary | ICD-10-CM | POA: Diagnosis present

## 2015-02-05 DIAGNOSIS — Z79899 Other long term (current) drug therapy: Secondary | ICD-10-CM | POA: Diagnosis not present

## 2015-02-05 DIAGNOSIS — Z7952 Long term (current) use of systemic steroids: Secondary | ICD-10-CM | POA: Insufficient documentation

## 2015-02-05 DIAGNOSIS — Z5111 Encounter for antineoplastic chemotherapy: Secondary | ICD-10-CM | POA: Diagnosis not present

## 2015-02-05 DIAGNOSIS — S90211A Contusion of right great toe with damage to nail, initial encounter: Secondary | ICD-10-CM | POA: Diagnosis not present

## 2015-02-05 DIAGNOSIS — Y998 Other external cause status: Secondary | ICD-10-CM | POA: Insufficient documentation

## 2015-02-05 DIAGNOSIS — H1032 Unspecified acute conjunctivitis, left eye: Secondary | ICD-10-CM | POA: Insufficient documentation

## 2015-02-05 DIAGNOSIS — H109 Unspecified conjunctivitis: Secondary | ICD-10-CM

## 2015-02-05 MED ORDER — TOBRAMYCIN-DEXAMETHASONE 0.3-0.1 % OP OINT
TOPICAL_OINTMENT | Freq: Three times a day (TID) | OPHTHALMIC | Status: DC
  Administered 2015-02-05: 1 via OPHTHALMIC
  Filled 2015-02-05: qty 3.5

## 2015-02-05 NOTE — ED Notes (Signed)
[name] [name] applied clean dressing to toe where nail has come off.  Pt applied her own antibiotic cream that her PCP has prescribed her.

## 2015-02-05 NOTE — ED Notes (Signed)
Pt states that when she woke up this morning she noticed her left eye was swollen sore and red.  Pt states that she though initially was that her contact was moved up in her eye bc she hasnt taken them out until earlier this week.  When she did take the contact out it was crinkled.  Pt states [place].  Pt states that she is having watery drainage out of it and light sensitivity.  Pt unsure if blurred vision bc she doesn't have her contact in at this time.  Pt states that she fell on Monday and not sure if that would have anything to do with it.    pt also taking chemo for breast cancer and states that she is having a nail problem which is a side effect of the chemo she was told and while she was here she wanted the nails looked at esp since she had on nail on one of her toes to fall off.

## 2015-02-05 NOTE — Discharge Instructions (Signed)
Rinse eye with tap water; apply 2 drops 3 times a day. Do not use her contact lenses. In regards to your toe, soak in warm salt water and then apply simple dressing with Neosporin ointment

## 2015-02-05 NOTE — ED Provider Notes (Signed)
CSN: [id number]     Arrival date & time 02/05/15  1601 History   First MD Initiated Contact with Patient 02/05/15 1616     Chief Complaint  Patient presents with  . Conjunctivitis  . Nail Problem  . chemo card     (Consider location/radiation/quality/duration/timing/severity/associated sxs/prior Treatment) HPI..... Left eye redness and clear drainage since earlier today. Contact lens was still in the eye initially. No pus draining from the eye. No trauma to eye.  Additionally, patient is concerned about the toe nail of her right great toe. Nail has been avulsed off and is regrowing. No pus or fever. Patient is currently being treated for breast cancer with chemotherapy.  Past Medical History  Diagnosis Date  . Breast cancer   . Pneumonia     hx of pneumonia 08/2013   . GERD (gastroesophageal reflux disease)     during pregnancy   . Breast cancer    Past Surgical History  Procedure Laterality Date  . Cesarean section    . Essure tubal ligation    . Cholecystectomy    . Wisdom tooth extraction    . Portacath placement Left 07/21/2014    Procedure: INSERTION PORT-A-CATH;  Surgeon: Stark Klein, MD;  Location: [facility];  Service: General;  Laterality: Left;   Family History  Problem Relation Age of Onset  . Breast cancer Mother 71  . Liver cancer Father   . Breast cancer Maternal Aunt 35  . Prostate cancer Maternal Grandfather 52  . Liver cancer Paternal Grandmother   . Prostate cancer Paternal Grandfather   . Prostate cancer Maternal Uncle 73  . Breast cancer Maternal Aunt 28  . Prostate cancer Maternal Uncle 68  . Breast cancer Cousin 26    mat first cousin   History  Substance Use Topics  . Smoking status: Never Smoker   . Smokeless tobacco: Never Used  . Alcohol Use: Yes     Comment: 2-3 drinks per week    OB History    No data available     Review of Systems  All other systems reviewed and are negative.     Allergies  Aspirin and Codeine  Home  Medications   Prior to Admission medications   Medication Sig Start Date End Date Taking? Authorizing Provider  Camphor-Eucalyptus-Menthol (VICKS VAPORUB EX) Apply 1 application topically at bedtime. Applies under nose, throat and on chest.   Yes Historical Provider, MD  cetirizine (ZYRTEC) 10 MG tablet Take 10 mg by mouth daily as needed for allergies (allergies).    Yes Historical Provider, MD  clindamycin (CLINDAGEL) 1 % gel Apply topically to affected areas BID. 11/16/14  Yes [name], MD  diphenhydrAMINE (SOMINEX) 25 MG tablet Take 50 mg by mouth at bedtime as needed for itching (itching).    Yes Historical Provider, MD  hydrocortisone 2.5 % cream APPLY TOPICALLY TO THE AFFECTED AREA TWICE DAILY 12/07/14  Yes [name], MD  lidocaine-prilocaine (EMLA) cream APPLY TO PORTACATH 1 AND 1/2- 2 HOURS PRIOR TO PROCEDURE AS NEEDED 12/14/14  Yes [name], MD  loperamide (IMODIUM A-D) 2 MG tablet Take 1 tablet (2 mg total) by mouth 4 (four) times daily as needed for diarrhea or loose stools. 09/21/14  Yes [name], MD  ondansetron (ZOFRAN) 8 MG tablet Take 1 tablet (8 mg total) by mouth every 8 (eight) hours as needed for nausea or vomiting. 07/28/14  Yes [name], MD  pantoprazole (PROTONIX) 20 MG tablet Take 1 tablet (20 mg total)  by mouth daily. 11/16/14  Yes [name], MD  Pseudoeph-Doxylamine-DM-APAP (NYQUIL PO) Take 30 mLs by mouth 2 (two) times daily as needed (cold/allergies).    Yes Historical Provider, MD  HYDROCORTISONE, TOPICAL, 2 % LOTN Apply topically to affected areas BID. Patient not taking: Reported on 02/05/2015 09/10/14   [name], MD  LORazepam (ATIVAN) 1 MG tablet Take 1 tablet (1 mg total) by mouth once. 02/01/15   [name], MD  oxyCODONE-acetaminophen (ROXICET) 5-325 MG per tablet Take 1-2 tablets by mouth every 4 (four) hours as needed for severe pain. Patient not taking: Reported on 02/05/2015 07/21/14   Stark Klein, MD  zolpidem (AMBIEN) 5 MG tablet Take 1 tablet (5 mg total) by mouth at  bedtime as needed for sleep. Patient not taking: Reported on 11/28/2014 08/31/14   [name], MD   BP 152/91 mmHg  Pulse 97  Temp(Src) 98.8 F (37.1 C) (Oral)  Resp 17  SpO2 100% Physical Exam  Constitutional: She is oriented to person, place, and time. She appears well-developed and well-nourished.  HENT:  Head: Normocephalic and atraumatic.  Eyes: EOM are normal. Pupils are equal, round, and reactive to light.  Right eye normal. Left eye: Conjunctiva inflamed and ureteric. No pus. Clear drainage.  Neck: Normal range of motion. Neck supple.  Cardiovascular: Normal rate and regular rhythm.   Pulmonary/Chest: Effort normal and breath sounds normal.  Abdominal: Soft. Bowel sounds are normal.  Musculoskeletal:  Right great toe: Nail is now regrowing. Tissue is slightly inflamed but no evidence of infection or pus.  Neurological: She is alert and oriented to person, place, and time.  Skin: Skin is warm and dry.  Psychiatric: She has a normal mood and affect. Her behavior is normal.  Nursing note and vitals reviewed.   ED Course  Procedures (including critical care time) Labs Review Labs Reviewed - No data to display  Imaging Review No results found.   EKG Interpretation None      MDM   Final diagnoses:  Conjunctivitis of left eye  Contusion of right great toe with damage to nail, initial encounter    Patient is in no acute distress. Rx Tobradex for left eye conjunctivitis. Simple soaks and dressing to right great toe.    Nat Christen, MD 02/05/15 [phone]

## 2015-02-07 ENCOUNTER — Other Ambulatory Visit (HOSPITAL_COMMUNITY): Payer: BLUE CROSS/BLUE SHIELD

## 2015-02-08 ENCOUNTER — Other Ambulatory Visit (HOSPITAL_BASED_OUTPATIENT_CLINIC_OR_DEPARTMENT_OTHER): Payer: BLUE CROSS/BLUE SHIELD

## 2015-02-08 ENCOUNTER — Ambulatory Visit (HOSPITAL_BASED_OUTPATIENT_CLINIC_OR_DEPARTMENT_OTHER): Payer: BLUE CROSS/BLUE SHIELD

## 2015-02-08 ENCOUNTER — Ambulatory Visit: Payer: BLUE CROSS/BLUE SHIELD

## 2015-02-08 VITALS — BP 128/76 | HR 94 | Temp 98.0°F | Resp 20

## 2015-02-08 DIAGNOSIS — Z5112 Encounter for antineoplastic immunotherapy: Secondary | ICD-10-CM | POA: Diagnosis not present

## 2015-02-08 DIAGNOSIS — C50911 Malignant neoplasm of unspecified site of right female breast: Secondary | ICD-10-CM

## 2015-02-08 DIAGNOSIS — C7800 Secondary malignant neoplasm of unspecified lung: Principal | ICD-10-CM

## 2015-02-08 DIAGNOSIS — C7801 Secondary malignant neoplasm of right lung: Secondary | ICD-10-CM

## 2015-02-08 DIAGNOSIS — C7802 Secondary malignant neoplasm of left lung: Secondary | ICD-10-CM | POA: Diagnosis not present

## 2015-02-08 DIAGNOSIS — D6481 Anemia due to antineoplastic chemotherapy: Secondary | ICD-10-CM

## 2015-02-08 DIAGNOSIS — Z95828 Presence of other vascular implants and grafts: Secondary | ICD-10-CM

## 2015-02-08 DIAGNOSIS — C50311 Malignant neoplasm of lower-inner quadrant of right female breast: Secondary | ICD-10-CM

## 2015-02-08 DIAGNOSIS — Z5111 Encounter for antineoplastic chemotherapy: Secondary | ICD-10-CM

## 2015-02-08 LAB — CBC WITH DIFFERENTIAL/PLATELET
BASO%: 0.8 % (ref 0.0–2.0)
BASOS ABS: 0.1 10*3/uL (ref 0.0–0.1)
EOS%: 0.9 % (ref 0.0–7.0)
Eosinophils Absolute: 0.1 10*3/uL (ref 0.0–0.5)
HCT: 33.5 % — ABNORMAL LOW (ref 34.8–46.6)
HEMOGLOBIN: 11 g/dL — AB (ref 11.6–15.9)
LYMPH#: 2.4 10*3/uL (ref 0.9–3.3)
LYMPH%: 33.4 % (ref 14.0–49.7)
MCH: 30.1 pg (ref 25.1–34.0)
MCHC: 32.8 g/dL (ref 31.5–36.0)
MCV: 91.8 fL (ref 79.5–101.0)
MONO#: 0.6 10*3/uL (ref 0.1–0.9)
MONO%: 8.4 % (ref 0.0–14.0)
NEUT#: 4.1 10*3/uL (ref 1.5–6.5)
NEUT%: 56.5 % (ref 38.4–76.8)
Platelets: 329 10*3/uL (ref 145–400)
RBC: 3.65 10*6/uL — ABNORMAL LOW (ref 3.70–5.45)
RDW: 15.2 % — ABNORMAL HIGH (ref 11.2–14.5)
WBC: 7.3 10*3/uL (ref 3.9–10.3)

## 2015-02-08 LAB — COMPREHENSIVE METABOLIC PANEL (CC13)
ALT: 20 U/L (ref 0–55)
AST: 17 U/L (ref 5–34)
Albumin: 3.7 g/dL (ref 3.5–5.0)
Alkaline Phosphatase: 61 U/L (ref 40–150)
Anion Gap: 7 mEq/L (ref 3–11)
BILIRUBIN TOTAL: 0.45 mg/dL (ref 0.20–1.20)
BUN: 13 mg/dL (ref 7.0–26.0)
CO2: 29 mEq/L (ref 22–29)
Calcium: 9.1 mg/dL (ref 8.4–10.4)
Chloride: 103 mEq/L (ref 98–109)
Creatinine: 0.8 mg/dL (ref 0.6–1.1)
GLUCOSE: 95 mg/dL (ref 70–140)
Potassium: 3.8 mEq/L (ref 3.5–5.1)
SODIUM: 139 meq/L (ref 136–145)
Total Protein: 6.8 g/dL (ref 6.4–8.3)

## 2015-02-08 MED ORDER — DIPHENHYDRAMINE HCL 50 MG/ML IJ SOLN
INTRAMUSCULAR | Status: AC
  Filled 2015-02-08: qty 1

## 2015-02-08 MED ORDER — TRASTUZUMAB CHEMO INJECTION 440 MG
6.0000 mg/kg | Freq: Once | INTRAVENOUS | Status: AC
  Administered 2015-02-08: 588 mg via INTRAVENOUS
  Filled 2015-02-08: qty 28

## 2015-02-08 MED ORDER — DEXTROSE 5 % IV SOLN
60.0000 mg/m2 | Freq: Once | INTRAVENOUS | Status: AC
  Administered 2015-02-08: 132 mg via INTRAVENOUS
  Filled 2015-02-08: qty 22

## 2015-02-08 MED ORDER — HEPARIN SOD (PORK) LOCK FLUSH 100 UNIT/ML IV SOLN
500.0000 [IU] | Freq: Once | INTRAVENOUS | Status: DC | PRN
  Filled 2015-02-08: qty 5

## 2015-02-08 MED ORDER — FAMOTIDINE IN NACL 20-0.9 MG/50ML-% IV SOLN
INTRAVENOUS | Status: AC
  Filled 2015-02-08: qty 50

## 2015-02-08 MED ORDER — SODIUM CHLORIDE 0.9 % IV SOLN
420.0000 mg | Freq: Once | INTRAVENOUS | Status: AC
  Administered 2015-02-08: 420 mg via INTRAVENOUS
  Filled 2015-02-08: qty 14

## 2015-02-08 MED ORDER — METHYLPREDNISOLONE SODIUM SUCC 40 MG IJ SOLR
INTRAMUSCULAR | Status: AC
  Filled 2015-02-08: qty 1

## 2015-02-08 MED ORDER — HEPARIN SOD (PORK) LOCK FLUSH 100 UNIT/ML IV SOLN
500.0000 [IU] | Freq: Once | INTRAVENOUS | Status: AC | PRN
  Administered 2015-02-08: 500 [IU]
  Filled 2015-02-08: qty 5

## 2015-02-08 MED ORDER — FAMOTIDINE IN NACL 20-0.9 MG/50ML-% IV SOLN
20.0000 mg | Freq: Once | INTRAVENOUS | Status: AC
  Administered 2015-02-08: 20 mg via INTRAVENOUS

## 2015-02-08 MED ORDER — SODIUM CHLORIDE 0.9 % IJ SOLN
10.0000 mL | INTRAMUSCULAR | Status: DC | PRN
  Administered 2015-02-08: 10 mL
  Filled 2015-02-08: qty 10

## 2015-02-08 MED ORDER — SODIUM CHLORIDE 0.9 % IV SOLN
Freq: Once | INTRAVENOUS | Status: AC
  Administered 2015-02-08: 15:00:00 via INTRAVENOUS

## 2015-02-08 MED ORDER — SODIUM CHLORIDE 0.9 % IV SOLN
Freq: Once | INTRAVENOUS | Status: AC
  Administered 2015-02-08: 16:00:00 via INTRAVENOUS
  Filled 2015-02-08: qty 4

## 2015-02-08 MED ORDER — SODIUM CHLORIDE 0.9 % IJ SOLN
10.0000 mL | INTRAMUSCULAR | Status: DC | PRN
  Filled 2015-02-08: qty 10

## 2015-02-08 MED ORDER — ACETAMINOPHEN 325 MG PO TABS
ORAL_TABLET | ORAL | Status: AC
  Filled 2015-02-08: qty 2

## 2015-02-08 MED ORDER — ACETAMINOPHEN 325 MG PO TABS
650.0000 mg | ORAL_TABLET | Freq: Once | ORAL | Status: AC
  Administered 2015-02-08: 650 mg via ORAL

## 2015-02-08 MED ORDER — METHYLPREDNISOLONE SODIUM SUCC 40 MG IJ SOLR
40.0000 mg | Freq: Once | INTRAMUSCULAR | Status: AC
  Administered 2015-02-08: 40 mg via INTRAVENOUS

## 2015-02-08 MED ORDER — SODIUM CHLORIDE 0.9 % IJ SOLN
10.0000 mL | INTRAMUSCULAR | Status: DC | PRN
  Administered 2015-02-08: 10 mL via INTRAVENOUS
  Filled 2015-02-08: qty 10

## 2015-02-08 MED ORDER — SODIUM CHLORIDE 0.9 % IV SOLN: Freq: Once | INTRAVENOUS | Status: DC

## 2015-02-08 MED ORDER — DIPHENHYDRAMINE HCL 50 MG/ML IJ SOLN
50.0000 mg | Freq: Once | INTRAMUSCULAR | Status: AC
  Administered 2015-02-08: 50 mg via INTRAVENOUS

## 2015-02-08 NOTE — Patient Instructions (Signed)
[facility] Discharge Instructions for Patients Receiving Chemotherapy  Today you received the following chemotherapy agents Herceptin/Perjeta/Taxol To help prevent nausea and vomiting after your treatment, we encourage you to take your nausea medication as prescribed.  If you develop nausea and vomiting that is not controlled by your nausea medication, call the clinic.   BELOW ARE SYMPTOMS THAT SHOULD BE REPORTED IMMEDIATELY:  *FEVER GREATER THAN 100.5 F  *CHILLS WITH OR WITHOUT FEVER  NAUSEA AND VOMITING THAT IS NOT CONTROLLED WITH YOUR NAUSEA MEDICATION  *UNUSUAL SHORTNESS OF BREATH  *UNUSUAL BRUISING OR BLEEDING  TENDERNESS IN MOUTH AND THROAT WITH OR WITHOUT PRESENCE OF ULCERS  *URINARY PROBLEMS  *BOWEL PROBLEMS  UNUSUAL RASH Items with * indicate a potential emergency and should be followed up as soon as possible.  Feel free to call the clinic you have any questions or concerns. The clinic phone number is [phone].  Please show the [facility] at check-in to the Emergency Department and triage nurse.

## 2015-02-08 NOTE — Patient Instructions (Signed)

## 2015-02-08 NOTE — Progress Notes (Signed)
Patient went to ED on Sat. 6/25 for eye irritation.  Contact lense had gotten out of position and caused irritation.  ED assessment was conjunctivitis and patient was given opthalmic antibiotic ointment.  Eye is better.  Patient also had them look at her toe - big right toe.  The nail came off Thursday.  They said "it looked fine."  Patient put new dressing on it this am and no change was noted.  Discussed with patient to assess it every day for any redness, swelling, pain or drainage.

## 2015-02-10 ENCOUNTER — Ambulatory Visit (HOSPITAL_COMMUNITY)
Admission: RE | Admit: 2015-02-10 | Discharge: 2015-02-10 | Disposition: A | Discharge status: Home / Self Care | Payer: BLUE CROSS/BLUE SHIELD | Source: Ambulatory Visit | Attending: Hematology | Admitting: Hematology

## 2015-02-10 DIAGNOSIS — C7801 Secondary malignant neoplasm of right lung: Secondary | ICD-10-CM

## 2015-02-10 DIAGNOSIS — C50911 Malignant neoplasm of unspecified site of right female breast: Secondary | ICD-10-CM

## 2015-02-10 DIAGNOSIS — C50919 Malignant neoplasm of unspecified site of unspecified female breast: Secondary | ICD-10-CM | POA: Insufficient documentation

## 2015-02-10 DIAGNOSIS — C7800 Secondary malignant neoplasm of unspecified lung: Secondary | ICD-10-CM

## 2015-02-10 NOTE — Progress Notes (Signed)
  Echocardiogram 2D Echocardiogram has been performed.  Diamond Nickel 02/10/2015, 4:25 PM

## 2015-02-15 ENCOUNTER — Ambulatory Visit: Payer: BLUE CROSS/BLUE SHIELD

## 2015-02-15 ENCOUNTER — Other Ambulatory Visit (HOSPITAL_BASED_OUTPATIENT_CLINIC_OR_DEPARTMENT_OTHER): Payer: BLUE CROSS/BLUE SHIELD

## 2015-02-15 ENCOUNTER — Ambulatory Visit (HOSPITAL_BASED_OUTPATIENT_CLINIC_OR_DEPARTMENT_OTHER): Payer: BLUE CROSS/BLUE SHIELD

## 2015-02-15 ENCOUNTER — Other Ambulatory Visit: Payer: Self-pay | Admitting: Oncology

## 2015-02-15 VITALS — BP 178/86 | HR 94 | Temp 99.1°F | Resp 18

## 2015-02-15 DIAGNOSIS — C50911 Malignant neoplasm of unspecified site of right female breast: Secondary | ICD-10-CM | POA: Diagnosis not present

## 2015-02-15 DIAGNOSIS — C7801 Secondary malignant neoplasm of right lung: Secondary | ICD-10-CM

## 2015-02-15 DIAGNOSIS — Z5111 Encounter for antineoplastic chemotherapy: Secondary | ICD-10-CM | POA: Diagnosis not present

## 2015-02-15 DIAGNOSIS — C7802 Secondary malignant neoplasm of left lung: Secondary | ICD-10-CM

## 2015-02-15 DIAGNOSIS — C7800 Secondary malignant neoplasm of unspecified lung: Principal | ICD-10-CM

## 2015-02-15 DIAGNOSIS — Z95828 Presence of other vascular implants and grafts: Secondary | ICD-10-CM

## 2015-02-15 LAB — CBC WITH DIFFERENTIAL/PLATELET
BASO%: 0.4 % (ref 0.0–2.0)
Basophils Absolute: 0 10*3/uL (ref 0.0–0.1)
EOS ABS: 0.1 10*3/uL (ref 0.0–0.5)
EOS%: 1.3 % (ref 0.0–7.0)
HCT: 31.5 % — ABNORMAL LOW (ref 34.8–46.6)
HEMOGLOBIN: 10.5 g/dL — AB (ref 11.6–15.9)
LYMPH%: 31.6 % (ref 14.0–49.7)
MCH: 30.4 pg (ref 25.1–34.0)
MCHC: 33.3 g/dL (ref 31.5–36.0)
MCV: 91.1 fL (ref 79.5–101.0)
MONO#: 0.4 10*3/uL (ref 0.1–0.9)
MONO%: 6.4 % (ref 0.0–14.0)
NEUT%: 60.3 % (ref 38.4–76.8)
NEUTROS ABS: 4.1 10*3/uL (ref 1.5–6.5)
Platelets: 304 10*3/uL (ref 145–400)
RBC: 3.46 10*6/uL — ABNORMAL LOW (ref 3.70–5.45)
RDW: 15 % — AB (ref 11.2–14.5)
WBC: 6.9 10*3/uL (ref 3.9–10.3)
lymph#: 2.2 10*3/uL (ref 0.9–3.3)

## 2015-02-15 LAB — COMPREHENSIVE METABOLIC PANEL (CC13)
ALK PHOS: 55 U/L (ref 40–150)
ALT: 20 U/L (ref 0–55)
ANION GAP: 9 meq/L (ref 3–11)
AST: 17 U/L (ref 5–34)
Albumin: 3.5 g/dL (ref 3.5–5.0)
BILIRUBIN TOTAL: 0.33 mg/dL (ref 0.20–1.20)
BUN: 12.6 mg/dL (ref 7.0–26.0)
CO2: 24 mEq/L (ref 22–29)
Calcium: 9.2 mg/dL (ref 8.4–10.4)
Chloride: 107 mEq/L (ref 98–109)
Creatinine: 0.8 mg/dL (ref 0.6–1.1)
GLUCOSE: 99 mg/dL (ref 70–140)
Potassium: 3.7 mEq/L (ref 3.5–5.1)
Sodium: 141 mEq/L (ref 136–145)
Total Protein: 6.5 g/dL (ref 6.4–8.3)

## 2015-02-15 MED ORDER — SODIUM CHLORIDE 0.9 % IV SOLN
Freq: Once | INTRAVENOUS | Status: AC
Start: 2015-02-15 — End: 2015-02-15
  Administered 2015-02-15: 16:00:00 via INTRAVENOUS

## 2015-02-15 MED ORDER — FAMOTIDINE IN NACL 20-0.9 MG/50ML-% IV SOLN
20.0000 mg | Freq: Once | INTRAVENOUS | Status: AC
  Administered 2015-02-15: 20 mg via INTRAVENOUS

## 2015-02-15 MED ORDER — HEPARIN SOD (PORK) LOCK FLUSH 100 UNIT/ML IV SOLN
500.0000 [IU] | Freq: Once | INTRAVENOUS | Status: AC | PRN
  Administered 2015-02-15: 500 [IU]
  Filled 2015-02-15: qty 5

## 2015-02-15 MED ORDER — FAMOTIDINE IN NACL 20-0.9 MG/50ML-% IV SOLN
INTRAVENOUS | Status: AC
Start: 2015-02-15 — End: 2015-02-15
  Filled 2015-02-15: qty 50

## 2015-02-15 MED ORDER — SODIUM CHLORIDE 0.9 % IJ SOLN
10.0000 mL | INTRAMUSCULAR | Status: DC | PRN
  Administered 2015-02-15: 10 mL
  Filled 2015-02-15: qty 10

## 2015-02-15 MED ORDER — PACLITAXEL CHEMO INJECTION 300 MG/50ML
60.0000 mg/m2 | Freq: Once | INTRAVENOUS | Status: AC
  Administered 2015-02-15: 132 mg via INTRAVENOUS
  Filled 2015-02-15: qty 22

## 2015-02-15 MED ORDER — SODIUM CHLORIDE 0.9 % IJ SOLN
10.0000 mL | INTRAMUSCULAR | Status: DC | PRN
  Administered 2015-02-15: 10 mL via INTRAVENOUS
  Filled 2015-02-15: qty 10

## 2015-02-15 MED ORDER — DIPHENHYDRAMINE HCL 50 MG/ML IJ SOLN
INTRAMUSCULAR | Status: AC
  Filled 2015-02-15: qty 1

## 2015-02-15 MED ORDER — SODIUM CHLORIDE 0.9 % IV SOLN
Freq: Once | INTRAVENOUS | Status: AC
  Administered 2015-02-15: 16:00:00 via INTRAVENOUS
  Filled 2015-02-15: qty 4

## 2015-02-15 MED ORDER — DIPHENHYDRAMINE HCL 50 MG/ML IJ SOLN
50.0000 mg | Freq: Once | INTRAMUSCULAR | Status: AC
  Administered 2015-02-15: 50 mg via INTRAVENOUS

## 2015-02-15 NOTE — Progress Notes (Signed)
15:30: Pts BP 178/86 and HR 94.  She states she has been worked up over something at work and new it would be elevated.  I told pt we will recheck prior to her discharge.  She denies any other symptoms.

## 2015-02-15 NOTE — Patient Instructions (Signed)

## 2015-02-21 ENCOUNTER — Ambulatory Visit (HOSPITAL_COMMUNITY)
Admission: RE | Admit: 2015-02-21 | Discharge: 2015-02-21 | Disposition: A | Discharge status: Home / Self Care | Payer: BLUE CROSS/BLUE SHIELD | Source: Ambulatory Visit | Attending: Hematology | Admitting: Hematology

## 2015-02-21 ENCOUNTER — Encounter (HOSPITAL_COMMUNITY): Payer: Self-pay

## 2015-02-21 ENCOUNTER — Other Ambulatory Visit: Payer: Self-pay | Admitting: *Deleted

## 2015-02-21 ENCOUNTER — Telehealth: Payer: Self-pay | Admitting: *Deleted

## 2015-02-21 DIAGNOSIS — C50911 Malignant neoplasm of unspecified site of right female breast: Secondary | ICD-10-CM | POA: Insufficient documentation

## 2015-02-21 DIAGNOSIS — C7800 Secondary malignant neoplasm of unspecified lung: Secondary | ICD-10-CM | POA: Insufficient documentation

## 2015-02-21 DIAGNOSIS — N200 Calculus of kidney: Secondary | ICD-10-CM | POA: Insufficient documentation

## 2015-02-21 LAB — GLUCOSE, CAPILLARY: Glucose-Capillary: 84 mg/dL (ref 65–99)

## 2015-02-21 MED ORDER — FLUDEOXYGLUCOSE F - 18 (FDG) INJECTION
12.0000 | Freq: Once | INTRAVENOUS | Status: AC | PRN
  Administered 2015-02-21: 12 via INTRAVENOUS

## 2015-02-21 NOTE — Telephone Encounter (Signed)
VERBAL ORDER AND READ BACK TO DR.[facility] AT 1:00PM. SHE WILL SEE DR.FENG AROUND 1:30PM TO DISCUSS THE RESULTS OF PT.'S PET SCAN. WHEN THE APPOINTMENT WITH DR.FENG IS COMPLETED PT. WILL GO TO THE TREATMENT ROOM. NOTIFIED PT. OF THE ABOVE CHANGE. SHE VOICES UNDERSTANDING.

## 2015-02-22 ENCOUNTER — Telehealth: Payer: Self-pay | Admitting: Hematology

## 2015-02-22 ENCOUNTER — Ambulatory Visit: Payer: BLUE CROSS/BLUE SHIELD

## 2015-02-22 ENCOUNTER — Encounter: Payer: BLUE CROSS/BLUE SHIELD | Admitting: Hematology

## 2015-02-22 ENCOUNTER — Ambulatory Visit (HOSPITAL_BASED_OUTPATIENT_CLINIC_OR_DEPARTMENT_OTHER): Payer: BLUE CROSS/BLUE SHIELD

## 2015-02-22 ENCOUNTER — Other Ambulatory Visit: Payer: BLUE CROSS/BLUE SHIELD

## 2015-02-22 ENCOUNTER — Other Ambulatory Visit (HOSPITAL_BASED_OUTPATIENT_CLINIC_OR_DEPARTMENT_OTHER): Payer: BLUE CROSS/BLUE SHIELD

## 2015-02-22 ENCOUNTER — Encounter: Payer: Self-pay | Admitting: Hematology

## 2015-02-22 ENCOUNTER — Ambulatory Visit (HOSPITAL_BASED_OUTPATIENT_CLINIC_OR_DEPARTMENT_OTHER): Payer: BLUE CROSS/BLUE SHIELD | Admitting: Hematology

## 2015-02-22 VITALS — BP 143/81 | HR 104 | Temp 98.5°F | Resp 18

## 2015-02-22 DIAGNOSIS — C50911 Malignant neoplasm of unspecified site of right female breast: Secondary | ICD-10-CM

## 2015-02-22 DIAGNOSIS — R21 Rash and other nonspecific skin eruption: Secondary | ICD-10-CM

## 2015-02-22 DIAGNOSIS — R197 Diarrhea, unspecified: Secondary | ICD-10-CM

## 2015-02-22 DIAGNOSIS — Z5111 Encounter for antineoplastic chemotherapy: Secondary | ICD-10-CM

## 2015-02-22 DIAGNOSIS — D6481 Anemia due to antineoplastic chemotherapy: Secondary | ICD-10-CM | POA: Diagnosis not present

## 2015-02-22 DIAGNOSIS — C7801 Secondary malignant neoplasm of right lung: Secondary | ICD-10-CM

## 2015-02-22 DIAGNOSIS — C7802 Secondary malignant neoplasm of left lung: Secondary | ICD-10-CM | POA: Diagnosis not present

## 2015-02-22 DIAGNOSIS — C7800 Secondary malignant neoplasm of unspecified lung: Principal | ICD-10-CM

## 2015-02-22 DIAGNOSIS — Z170 Estrogen receptor positive status [ER+]: Secondary | ICD-10-CM | POA: Diagnosis not present

## 2015-02-22 DIAGNOSIS — Z95828 Presence of other vascular implants and grafts: Secondary | ICD-10-CM

## 2015-02-22 DIAGNOSIS — G620 Drug-induced polyneuropathy: Secondary | ICD-10-CM

## 2015-02-22 DIAGNOSIS — L609 Nail disorder, unspecified: Secondary | ICD-10-CM

## 2015-02-22 DIAGNOSIS — R109 Unspecified abdominal pain: Secondary | ICD-10-CM

## 2015-02-22 LAB — CBC WITH DIFFERENTIAL/PLATELET
BASO%: 0.4 % (ref 0.0–2.0)
BASOS ABS: 0 10*3/uL (ref 0.0–0.1)
EOS%: 1.2 % (ref 0.0–7.0)
Eosinophils Absolute: 0.1 10*3/uL (ref 0.0–0.5)
HEMATOCRIT: 32.1 % — AB (ref 34.8–46.6)
HGB: 10.6 g/dL — ABNORMAL LOW (ref 11.6–15.9)
LYMPH%: 29.6 % (ref 14.0–49.7)
MCH: 30.4 pg (ref 25.1–34.0)
MCHC: 33.2 g/dL (ref 31.5–36.0)
MCV: 91.7 fL (ref 79.5–101.0)
MONO#: 0.6 10*3/uL (ref 0.1–0.9)
MONO%: 8.1 % (ref 0.0–14.0)
NEUT%: 60.7 % (ref 38.4–76.8)
NEUTROS ABS: 4.2 10*3/uL (ref 1.5–6.5)
PLATELETS: 302 10*3/uL (ref 145–400)
RBC: 3.5 10*6/uL — AB (ref 3.70–5.45)
RDW: 15 % — ABNORMAL HIGH (ref 11.2–14.5)
WBC: 6.9 10*3/uL (ref 3.9–10.3)
lymph#: 2.1 10*3/uL (ref 0.9–3.3)

## 2015-02-22 LAB — COMPREHENSIVE METABOLIC PANEL (CC13)
ALK PHOS: 56 U/L (ref 40–150)
ALT: 19 U/L (ref 0–55)
AST: 18 U/L (ref 5–34)
Albumin: 3.4 g/dL — ABNORMAL LOW (ref 3.5–5.0)
Anion Gap: 7 mEq/L (ref 3–11)
BILIRUBIN TOTAL: 0.34 mg/dL (ref 0.20–1.20)
BUN: 12.6 mg/dL (ref 7.0–26.0)
CO2: 26 meq/L (ref 22–29)
Calcium: 9.2 mg/dL (ref 8.4–10.4)
Chloride: 107 mEq/L (ref 98–109)
Creatinine: 0.7 mg/dL (ref 0.6–1.1)
EGFR: >90 mL/min/{1.73_m2} (ref >90)
Glucose: 89 mg/dl (ref 70–140)
Potassium: 3.8 mEq/L (ref 3.5–5.1)
Sodium: 140 mEq/L (ref 136–145)
Total Protein: 6.5 g/dL (ref 6.4–8.3)

## 2015-02-22 MED ORDER — FAMOTIDINE IN NACL 20-0.9 MG/50ML-% IV SOLN
INTRAVENOUS | Status: AC
  Filled 2015-02-22: qty 50

## 2015-02-22 MED ORDER — SODIUM CHLORIDE 0.9 % IJ SOLN
10.0000 mL | INTRAMUSCULAR | Status: DC | PRN
  Administered 2015-02-22: 10 mL
  Filled 2015-02-22: qty 10

## 2015-02-22 MED ORDER — DIPHENHYDRAMINE HCL 50 MG/ML IJ SOLN
50.0000 mg | Freq: Once | INTRAMUSCULAR | Status: AC
  Administered 2015-02-22: 50 mg via INTRAVENOUS

## 2015-02-22 MED ORDER — FAMOTIDINE IN NACL 20-0.9 MG/50ML-% IV SOLN
20.0000 mg | Freq: Once | INTRAVENOUS | Status: AC
  Administered 2015-02-22: 20 mg via INTRAVENOUS

## 2015-02-22 MED ORDER — SODIUM CHLORIDE 0.9 % IV SOLN
Freq: Once | INTRAVENOUS | Status: AC
  Administered 2015-02-22: 15:00:00 via INTRAVENOUS
  Filled 2015-02-22: qty 4

## 2015-02-22 MED ORDER — PACLITAXEL CHEMO INJECTION 300 MG/50ML
60.0000 mg/m2 | Freq: Once | INTRAVENOUS | Status: AC
  Administered 2015-02-22: 132 mg via INTRAVENOUS
  Filled 2015-02-22: qty 22

## 2015-02-22 MED ORDER — SODIUM CHLORIDE 0.9 % IV SOLN
Freq: Once | INTRAVENOUS | Status: AC
  Administered 2015-02-22: 15:00:00 via INTRAVENOUS

## 2015-02-22 MED ORDER — SODIUM CHLORIDE 0.9 % IJ SOLN
10.0000 mL | INTRAMUSCULAR | Status: DC | PRN
  Administered 2015-02-22: 10 mL via INTRAVENOUS
  Filled 2015-02-22: qty 10

## 2015-02-22 MED ORDER — HEPARIN SOD (PORK) LOCK FLUSH 100 UNIT/ML IV SOLN
500.0000 [IU] | Freq: Once | INTRAVENOUS | Status: AC | PRN
  Administered 2015-02-22: 500 [IU]
  Filled 2015-02-22: qty 5

## 2015-02-22 MED ORDER — DIPHENHYDRAMINE HCL 50 MG/ML IJ SOLN
INTRAMUSCULAR | Status: AC
  Filled 2015-02-22: qty 1

## 2015-02-22 NOTE — Telephone Encounter (Signed)
Added appt pt will get copy in chemo

## 2015-02-22 NOTE — Progress Notes (Addendum)
[place]  Follow up note   Patient Care Team: [name], DO as PCP - General (Family Medicine) [name], DO (Family Medicine) Holley Bouche, NP as Nurse Practitioner (Nurse Practitioner) Stark Klein, MD as Consulting Physician (General Surgery) Arloa Koh, MD as Consulting Physician (Radiation Oncology) [name], MD as Consulting Physician (Hematology)  CHIEF COMPLAINTS Follow up metastatic breast cancer  Oncology History   Breast cancer 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   Staging form: Breast, AJCC 7th Edition     Clinical stage from 07/22/2014: Stage IV (T3, N1, M1) - Unsigned       Breast cancer 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   07/02/2014 Mammogram Mammogram showed a 2cm right beast mass and a 1.8cm right axillary node. MRI breast on 07/16/2014 showed 7cm R breast lesion and 4.4cm r axillary node    07/02/2014 Imaging CT CAP: a 4.7cm mass in LUL lung and a 2.1cm mas in RML, and a small nodule in RUL, suspecious for metastasis     07/09/2014 Initial Diagnosis right IDA with b/l lung lesions, ER-/PR-/HER2+   07/09/2014 Initial Biopsy US guided right breast mass and axillary node biopsy showed IDA, and DCIS, ER-/PR-/HER2+   07/26/2014 Pathologic Stage Left lung mass by IR, path revealed high grade carcinoma, morphology similar to breast tumor biopsy, TTF(-), NapsinA(-), ER(-)   08/04/2014 -  Chemotherapy weekly Paclitaxel 67m/m2, trastuzumab and pertuzumab every 3 weeks   10/04/2014 Imaging Interval decrease in the right axillary lymphadenopathy. Bilateral pulmonary lesions with left hilar lymphadenopathy also markedly decreased in the interval. The left hilar lymphadenopathy has resolved.   12/20/2014 Imaging restaging CT showed stable disease, no new lesions     CURRENT THERAPY: weekly Paclitaxel 869mm2 (changed to 70 mg/m from cycle 16 due to abdominal cramps, 60 mg/m since cycles 22nd due to neuropathy), trastuzumab and pertuzumab every 3 weeks, started on  08/04/2014  INTERVAL HISTROY   [place] for follow-up and 30th dose of Taxol. Her peripheral neuropathy has been stable overall. She occasionally has difficulty buttoning and picking up small things, the numbness and tingling or stable, pain is minimal. She does not feel her neuropathy need any medical treatment at this point. Her right big toe nail fell off a few weeks ago. No other new complaints. She otherwise doing well overall.  MEDICAL HISTORY:  Past Medical History  Diagnosis Date  . Breast cancer   . Pneumonia     hx of pneumonia 08/2013   . GERD (gastroesophageal reflux disease)     during pregnancy   . Breast cancer     SURGICAL HISTORY: Past Surgical History  Procedure Laterality Date  . Cesarean section    . Essure tubal ligation    . Cholecystectomy    . Wisdom tooth extraction    . Portacath placement Left 07/21/2014    Procedure: INSERTION PORT-A-CATH;  Surgeon: FaStark KleinMD;  Location: [facility];  Service: General;  Laterality: Left;    SOCIAL HISTORY: History   Social History  . Marital Status: Married    Spouse Name: N/A    Number of Children:  she has 2 daughters at the age of [age]  . Years of Education: N/A   Occupational History  .  works as maFreight forwarderor a caFilm/video editor   Social History Main Topics  . Smoking status: Never Smoker   . Smokeless tobacco: Not on file  . Alcohol Use: Yes  . Drug Use: No  . Sexual Activity:  No    FAMILY HISTORY: Family History  Problem Relation Age of Onset  . Breast cancer Mother 27  . Liver cancer Father 56  . Breast cancer Maternal Aunt 36  . Prostate cancer Maternal Grandfather   . Liver cancer Paternal Grandmother   . Prostate cancer Paternal Grandfather      GENETICS: 08/30/2014 BreastNext panel was negative. 17 genes including BRCA1, BRCA2, were negative for mutations.   ALLERGIES:  is allergic to aspirin and codeine.  MEDICATIONS:  Current Outpatient Prescriptions  Medication Sig  Dispense Refill  . Camphor-Eucalyptus-Menthol (VICKS VAPORUB EX) Apply 1 application topically at bedtime. Applies under nose, throat and on chest.    . cetirizine (ZYRTEC) 10 MG tablet Take 10 mg by mouth daily as needed for allergies (allergies).     . clindamycin (CLINDAGEL) 1 % gel Apply topically to affected areas BID. 60 g 3  . diphenhydrAMINE (SOMINEX) 25 MG tablet Take 50 mg by mouth at bedtime as needed for itching (itching).     . hydrocortisone 2.5 % cream APPLY TOPICALLY TO THE AFFECTED AREA TWICE DAILY 454 g 0  . HYDROCORTISONE, TOPICAL, 2 % LOTN Apply topically to affected areas BID. 29.6 mL 1  . lidocaine-prilocaine (EMLA) cream APPLY TO PORTACATH 1 AND 1/2- 2 HOURS PRIOR TO PROCEDURE AS NEEDED 30 g 1  . LORazepam (ATIVAN) 1 MG tablet Take 1 tablet (1 mg total) by mouth once. 2 tablet 0  . ondansetron (ZOFRAN) 8 MG tablet Take 1 tablet (8 mg total) by mouth every 8 (eight) hours as needed for nausea or vomiting. 30 tablet 3  . oxyCODONE-acetaminophen (ROXICET) 5-325 MG per tablet Take 1-2 tablets by mouth every 4 (four) hours as needed for severe pain. 30 tablet 0  . pantoprazole (PROTONIX) 20 MG tablet Take 1 tablet (20 mg total) by mouth daily. 30 tablet 3  . Pseudoeph-Doxylamine-DM-APAP (NYQUIL PO) Take 30 mLs by mouth 2 (two) times daily as needed (cold/allergies).     . zolpidem (AMBIEN) 5 MG tablet Take 1 tablet (5 mg total) by mouth at bedtime as needed for sleep. 20 tablet 0  . loperamide (IMODIUM A-D) 2 MG tablet Take 1 tablet (2 mg total) by mouth 4 (four) times daily as needed for diarrhea or loose stools. (Patient not taking: Reported on 02/22/2015) 30 tablet 0   No current facility-administered medications for this visit.    REVIEW OF SYSTEMS:   Constitutional: Denies fevers, chills or abnormal night sweats, (+) malaise  Eyes: Denies blurriness of vision, double vision or watery eyes Ears, nose, mouth, throat, and face: Denies mucositis or sore throat Respiratory:  (+) productive cough, no dyspnea or wheezes Cardiovascular: Denies palpitation, chest discomfort or lower extremity swelling Gastrointestinal:  Denies nausea, heartburn or change in bowel habits Skin:(+) rashes  Lymphatics: Denies new lymphadenopathy or easy bruising Neurological:Denies numbness, tingling or new weaknesses Behavioral/Psych: Mood is stable, no new changes  All other systems were reviewed with the patient and are negative.  PHYSICAL EXAMINATION: ECOG PERFORMANCE STATUS: 1 BP 143/81 mmHg  Pulse 104  Temp(Src) 98.5 F (36.9 C) (Oral)  Resp 18  SpO2 100% GENERAL:alert, no distress and comfortable SKIN: skin color, texture, turgor are normal, no rashes or significant lesions EYES: normal, conjunctiva are pink and non-injected, sclera clear OROPHARYNX:no exudate, no erythema and lips, buccal mucosa, and tongue normal  NECK: supple, thyroid normal size, non-tender, without nodularity LYMPH:  no palpable lymphadenopathy in the cervical, axillary or inguinal LUNGS: clear to auscultation and  percussion with normal breathing effort HEART: regular rate & rhythm and no murmurs and no lower extremity edema ABDOMEN:abdomen soft, non-tender and normal bowel sounds Musculoskeletal:no cyanosis of digits and no clubbing  PSYCH: alert & oriented x 3 with fluent speech NEURO: no focal motor/sensory deficits Breasts: Breast inspection showed them to be symmetrical with no skin change or nipple discharge. Palpation of the right breast showed no palpable mass in the lower inner quadrant, (+) fullness in this area, and the previously palpable right axillary node is not palpable anymore. Left breast  and axilla revealed no obvious mass that I could appreciate. Skin: (+) Skin pigmentation on hands and feet.  a few acne-like rash on face and neck. No new skin rashes. (+)  pigmentation on fingernails, and bulging of fingernails and thickening of soft tissue under nailbed. The right big toenail has  felt off, with a small open pink area, no discharge   LABORATORY DATA:  I have reviewed the data as listed CBC Latest Ref Rng 02/22/2015 02/15/2015 02/08/2015  WBC 3.9 - 10.3 10e3/uL 6.9 6.9 7.3  Hemoglobin 11.6 - 15.9 g/dL 10.6(L) 10.5(L) 11.0(L)  Hematocrit 34.8 - 46.6 % 32.1(L) 31.5(L) 33.5(L)  Platelets 145 - 400 10e3/uL 302 304 329    CMP Latest Ref Rng 02/22/2015 02/15/2015 02/08/2015  Glucose 70 - 140 mg/dl 89 99 95  BUN 7.0 - 26.0 mg/dL 12.6 12.6 13.0  Creatinine 0.6 - 1.1 mg/dL 0.7 0.8 0.8  Sodium 136 - 145 mEq/L 140 141 139  Potassium 3.5 - 5.1 mEq/L 3.8 3.7 3.8  Chloride 96 - 112 mmol/L - - -  CO2 22 - 29 mEq/L 26 24 29   Calcium 8.4 - 10.4 mg/dL 9.2 9.2 9.1  Total Protein 6.4 - 8.3 g/dL 6.5 6.5 6.8  Total Bilirubin 0.20 - 1.20 mg/dL 0.34 0.33 0.45  Alkaline Phos 40 - 150 U/L 56 55 61  AST 5 - 34 U/L 18 17 17   ALT 0 - 55 U/L 19 20 20      PATHOLOGY REPORT 07/09/2014 #1 breast, right needle core biopsy more anterior -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 45)  Lung, biopsy, Left 07/26/2014 - HIGH GRADE CARCINOMA, SEE COMMENT. Microscopic Comment The carcinoma demonstrates the following immunophenotype: TTF-1 - negative expression. Napsin A- negative expression. CK5/6 - focal, moderate expression. estrogen receptor - negative expression. GCDFP- negative expression. The recent breast biopsy demonstrating Her2 amplified high grade invasive mammary carcinoma is noted ([id number]). Although the immunophenotype of the current case is non-sepcific, it strongly argues against primary lung adenocarcinoma. However, on re-review, the morphology of the current case is essentially identical to the primary mammary carcinoma. In lieu of further immunophenotyping,  the tumor will be submitted for Her2 testing and the remaining tissue will be reserved for additional ancillary tumor testing. The results of the Her2 testing will be reported in an addendum. The case was discussed with Dr Burr Medico on 07/28/2015 and 07/29/2015 HER2 POSITIVE    RADIOGRAPHIC STUDIES: CT CAP 10/04/2014 IMPRESSION: Interval decrease in the right axillary lymphadenopathy.  Bilateral pulmonary lesions with left hilar lymphadenopathy also markedly decreased in the interval. The left hilar lymphadenopathy has resolved.  3 millimeter nonobstructing right renal stone.   CT chest, abdomen and pelvis with contrast 12/20/2014 IMPRESSION: 1. Today's study is very similar to the prior examination  from 10/04/2014, demonstrating stable size of metastatic lesions in the right middle lobe and left upper lobe, and no new evidence of metastatic disease in the thorax. 2. 2.0 x 0.9 cm lesion in the medial aspect of the right breast is similar to prior examination. No residual right axillary lymphadenopathy. 3. As with prior examinations, there is no evidence of metastatic disease to the abdomen or pelvis. 4. 3 mm nonobstructive calculus in the lower pole collecting system of the right kidney.  PET scan 02/21/2015  IMPRESSION: 1. No evidence of metabolically active breast cancer metastasis. 2. Stable parenchymal thickening in the upper lobes at site of prior pulmonary masses. These stable pulmonary parenchymal thicken does not have associated metabolic activity.   ECHO 02/10/2015  Study Conclusions  - Left ventricle: LV longitudinal strain was -18.3% The cavity size was normal. Systolic function was normal. The estimated ejection fraction was in the range of 55% to 60%. Wall motion was normal; there were no regional wall motion abnormalities. - Mitral valve: There was mild regurgitation. - Right ventricle: The cavity size was mildly dilated. Wall thickness was  normal. - Tricuspid valve: There was mild regurgitation. - Pulmonary arteries: PA peak pressure: 33 mm Hg (S).  ASSESSMENT & PLAN:  44 year old African-American female, premenopausal, without significant past medical history, presented with palpable right breast mass and right axillary mass.   1. 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 -I reviewed her PET scan from 02/21/2015, which showed complete metabolic response. CT component showed stable parenchymal thickening of her lung lesions. -We discussed the possibility of breast surgery. Given her complete metabolic response to chemotherapy, it'll be reasonable to have the breast surgery to remove the primary tumor, which will reduce her tumor burden and prolong her life. I have discussed this with her surgeon Dr. Barry Dienes, she will see her in 3-4 weeks. -continue weekly paclitaxel, trastuzumab and pertuzumab every 3 weeks for now. I will likely stop Taxol we'll months before surgery, continue do her to antibody for a total of one year then changed to Herceptin maintenance therapy. -I'll obtain a breast MRI in the next 2-3 weeks before surgery -She had normal echo at baseline, repeated one normal. Will monitor ECHO  every 3 months when she is on dual anti-HER-2 therapy, next echo due in sep 2016 - due to her worsening abdominal pain and neuropathy, I have decreased her weekly Taxol dosed to 60 mg/m, will watch her neuropathy carefully.  -Lab reviewed, adequate for treatment, we'll proceed cycle 30 Taxol today, cycle 9 Herceptin and Perjeta next week   2. Skin rashes on face and scalp, likely related to antibody therapy  -The rash on upper chest much improved with topical steroids, near resolved now  - continue topical steroids and clindamycin gel as  needed   3. Nail changes -secondary to Taxol. -continue warm water with viniger soaking once daily, holding ice bag he hands during chemotherapy   4. Abdominal  pain and mild diarrhea -Likely related to Taxol  -near resolved afterTaxol dose reduction.  -Use Imodium as needed for diarrhea   5. G2 peripheral neuropathy, skin pigmentation, hands and feet -Stable. We'll continue to monitor closely.  6. Anemia, secondary to chemotherapy -Stable. No need for transfusion.  Plan -continue chemo today and weekly  -Breast MRI in 2-3 weeks -she  will see Dr. Barry Dienes in 3-4 weeks -I will see her back in 3 weeks   I spent about 30 minutes counseling the patient and her family members, total care was about 40 minutes.  [name]  02/22/2015

## 2015-02-22 NOTE — Patient Instructions (Signed)

## 2015-02-22 NOTE — Telephone Encounter (Signed)
s.w. pt and advised on 1pm time...pt says she was already contacted and advised on appt

## 2015-02-22 NOTE — Patient Instructions (Signed)
[facility] Discharge Instructions for Patients Receiving Chemotherapy  Today you received the following chemotherapy agents:  Taxol  To help prevent nausea and vomiting after your treatment, we encourage you to take your nausea medication as ordered per MD.   If you develop nausea and vomiting that is not controlled by your nausea medication, call the clinic.   BELOW ARE SYMPTOMS THAT SHOULD BE REPORTED IMMEDIATELY:  *FEVER GREATER THAN 100.5 F  *CHILLS WITH OR WITHOUT FEVER  NAUSEA AND VOMITING THAT IS NOT CONTROLLED WITH YOUR NAUSEA MEDICATION  *UNUSUAL SHORTNESS OF BREATH  *UNUSUAL BRUISING OR BLEEDING  TENDERNESS IN MOUTH AND THROAT WITH OR WITHOUT PRESENCE OF ULCERS  *URINARY PROBLEMS  *BOWEL PROBLEMS  UNUSUAL RASH Items with * indicate a potential emergency and should be followed up as soon as possible.  Feel free to call the clinic you have any questions or concerns. The clinic phone number is [phone].  Please show the CHEMO ALERT CARD at check-in to the Emergency Department and triage nurse.   

## 2015-02-24 ENCOUNTER — Other Ambulatory Visit: Payer: Self-pay | Admitting: Hematology

## 2015-02-24 DIAGNOSIS — C50911 Malignant neoplasm of unspecified site of right female breast: Secondary | ICD-10-CM

## 2015-03-01 ENCOUNTER — Other Ambulatory Visit (HOSPITAL_BASED_OUTPATIENT_CLINIC_OR_DEPARTMENT_OTHER): Payer: BLUE CROSS/BLUE SHIELD

## 2015-03-01 ENCOUNTER — Ambulatory Visit (HOSPITAL_BASED_OUTPATIENT_CLINIC_OR_DEPARTMENT_OTHER): Payer: BLUE CROSS/BLUE SHIELD

## 2015-03-01 ENCOUNTER — Ambulatory Visit: Payer: BLUE CROSS/BLUE SHIELD

## 2015-03-01 DIAGNOSIS — C7801 Secondary malignant neoplasm of right lung: Secondary | ICD-10-CM

## 2015-03-01 DIAGNOSIS — C7800 Secondary malignant neoplasm of unspecified lung: Principal | ICD-10-CM

## 2015-03-01 DIAGNOSIS — C50911 Malignant neoplasm of unspecified site of right female breast: Secondary | ICD-10-CM | POA: Diagnosis not present

## 2015-03-01 DIAGNOSIS — C7802 Secondary malignant neoplasm of left lung: Secondary | ICD-10-CM | POA: Diagnosis not present

## 2015-03-01 DIAGNOSIS — Z95828 Presence of other vascular implants and grafts: Secondary | ICD-10-CM

## 2015-03-01 DIAGNOSIS — Z5111 Encounter for antineoplastic chemotherapy: Secondary | ICD-10-CM | POA: Diagnosis not present

## 2015-03-01 DIAGNOSIS — D6481 Anemia due to antineoplastic chemotherapy: Secondary | ICD-10-CM | POA: Diagnosis not present

## 2015-03-01 DIAGNOSIS — Z5112 Encounter for antineoplastic immunotherapy: Secondary | ICD-10-CM | POA: Diagnosis not present

## 2015-03-01 LAB — COMPREHENSIVE METABOLIC PANEL (CC13)
ALT: 20 U/L (ref 0–55)
ANION GAP: 7 meq/L (ref 3–11)
AST: 19 U/L (ref 5–34)
Albumin: 3.6 g/dL (ref 3.5–5.0)
Alkaline Phosphatase: 59 U/L (ref 40–150)
BUN: 12.3 mg/dL (ref 7.0–26.0)
CALCIUM: 9.4 mg/dL (ref 8.4–10.4)
CHLORIDE: 107 meq/L (ref 98–109)
CO2: 25 mEq/L (ref 22–29)
Creatinine: 0.8 mg/dL (ref 0.6–1.1)
EGFR: >90 mL/min/{1.73_m2} (ref >90)
GLUCOSE: 96 mg/dL (ref 70–140)
Potassium: 3.9 mEq/L (ref 3.5–5.1)
Sodium: 140 mEq/L (ref 136–145)
Total Bilirubin: 0.42 mg/dL (ref 0.20–1.20)
Total Protein: 6.8 g/dL (ref 6.4–8.3)

## 2015-03-01 LAB — CBC WITH DIFFERENTIAL/PLATELET
BASO%: 0.7 % (ref 0.0–2.0)
Basophils Absolute: 0 10*3/uL (ref 0.0–0.1)
EOS ABS: 0.1 10*3/uL (ref 0.0–0.5)
EOS%: 1.2 % (ref 0.0–7.0)
HEMATOCRIT: 33.8 % — AB (ref 34.8–46.6)
HEMOGLOBIN: 10.9 g/dL — AB (ref 11.6–15.9)
LYMPH%: 32.9 % (ref 14.0–49.7)
MCH: 29.6 pg (ref 25.1–34.0)
MCHC: 32.3 g/dL (ref 31.5–36.0)
MCV: 91.6 fL (ref 79.5–101.0)
MONO#: 0.5 10*3/uL (ref 0.1–0.9)
MONO%: 7.6 % (ref 0.0–14.0)
NEUT%: 57.6 % (ref 38.4–76.8)
NEUTROS ABS: 3.9 10*3/uL (ref 1.5–6.5)
Platelets: 302 10*3/uL (ref 145–400)
RBC: 3.69 10*6/uL — AB (ref 3.70–5.45)
RDW: 15.2 % — AB (ref 11.2–14.5)
WBC: 6.7 10*3/uL (ref 3.9–10.3)
lymph#: 2.2 10*3/uL (ref 0.9–3.3)

## 2015-03-01 MED ORDER — DIPHENHYDRAMINE HCL 50 MG/ML IJ SOLN
50.0000 mg | Freq: Once | INTRAMUSCULAR | Status: AC
  Administered 2015-03-01: 50 mg via INTRAVENOUS

## 2015-03-01 MED ORDER — HEPARIN SOD (PORK) LOCK FLUSH 100 UNIT/ML IV SOLN
500.0000 [IU] | Freq: Once | INTRAVENOUS | Status: AC | PRN
  Administered 2015-03-01: 500 [IU]
  Filled 2015-03-01: qty 5

## 2015-03-01 MED ORDER — DIPHENHYDRAMINE HCL 50 MG/ML IJ SOLN
INTRAMUSCULAR | Status: AC
  Filled 2015-03-01: qty 1

## 2015-03-01 MED ORDER — METHYLPREDNISOLONE SODIUM SUCC 40 MG IJ SOLR
40.0000 mg | Freq: Once | INTRAMUSCULAR | Status: AC
  Administered 2015-03-01: 40 mg via INTRAVENOUS

## 2015-03-01 MED ORDER — SODIUM CHLORIDE 0.9 % IJ SOLN
10.0000 mL | INTRAMUSCULAR | Status: DC | PRN
  Administered 2015-03-01: 10 mL
  Filled 2015-03-01: qty 10

## 2015-03-01 MED ORDER — SODIUM CHLORIDE 0.9 % IV SOLN
Freq: Once | INTRAVENOUS | Status: AC
  Administered 2015-03-01: 15:00:00 via INTRAVENOUS

## 2015-03-01 MED ORDER — SODIUM CHLORIDE 0.9 % IV SOLN
Freq: Once | INTRAVENOUS | Status: AC
  Administered 2015-03-01: 17:00:00 via INTRAVENOUS
  Filled 2015-03-01: qty 4

## 2015-03-01 MED ORDER — ACETAMINOPHEN 325 MG PO TABS
650.0000 mg | ORAL_TABLET | Freq: Once | ORAL | Status: AC
  Administered 2015-03-01: 650 mg via ORAL

## 2015-03-01 MED ORDER — FAMOTIDINE IN NACL 20-0.9 MG/50ML-% IV SOLN
20.0000 mg | Freq: Once | INTRAVENOUS | Status: AC
  Administered 2015-03-01: 20 mg via INTRAVENOUS

## 2015-03-01 MED ORDER — FAMOTIDINE IN NACL 20-0.9 MG/50ML-% IV SOLN
INTRAVENOUS | Status: AC
  Filled 2015-03-01: qty 50

## 2015-03-01 MED ORDER — ACETAMINOPHEN 325 MG PO TABS
ORAL_TABLET | ORAL | Status: AC
  Filled 2015-03-01: qty 2

## 2015-03-01 MED ORDER — SODIUM CHLORIDE 0.9 % IV SOLN
420.0000 mg | Freq: Once | INTRAVENOUS | Status: AC
  Administered 2015-03-01: 420 mg via INTRAVENOUS
  Filled 2015-03-01: qty 14

## 2015-03-01 MED ORDER — SODIUM CHLORIDE 0.9 % IJ SOLN
10.0000 mL | INTRAMUSCULAR | Status: DC | PRN
  Administered 2015-03-01: 10 mL via INTRAVENOUS
  Filled 2015-03-01: qty 10

## 2015-03-01 MED ORDER — METHYLPREDNISOLONE SODIUM SUCC 40 MG IJ SOLR
INTRAMUSCULAR | Status: AC
  Filled 2015-03-01: qty 1

## 2015-03-01 MED ORDER — PACLITAXEL CHEMO INJECTION 300 MG/50ML
60.0000 mg/m2 | Freq: Once | INTRAVENOUS | Status: AC
  Administered 2015-03-01: 132 mg via INTRAVENOUS
  Filled 2015-03-01: qty 22

## 2015-03-01 MED ORDER — TRASTUZUMAB CHEMO INJECTION 440 MG
6.0000 mg/kg | Freq: Once | INTRAVENOUS | Status: AC
  Administered 2015-03-01: 588 mg via INTRAVENOUS
  Filled 2015-03-01: qty 28

## 2015-03-01 NOTE — Patient Instructions (Signed)
Paclitaxel injection What is this medicine? PACLITAXEL (PAK li TAX el) is a chemotherapy drug. It targets fast dividing cells, like cancer cells, and causes these cells to die. This medicine is used to treat ovarian cancer, breast cancer, and other cancers. This medicine may be used for other purposes; ask your health care provider or pharmacist if you have questions. COMMON BRAND NAME(S): Onxol, Taxol What should I tell my health care provider before I take this medicine? They need to know if you have any of these conditions: -blood disorders -irregular heartbeat -infection (especially a virus infection such as chickenpox, cold sores, or herpes) -liver disease -previous or ongoing radiation therapy -an unusual or allergic reaction to paclitaxel, alcohol, polyoxyethylated castor oil, other chemotherapy agents, other medicines, foods, dyes, or preservatives -pregnant or trying to get pregnant -breast-feeding How should I use this medicine? This drug is given as an infusion into a vein. It is administered in a hospital or clinic by a specially trained health care professional. Talk to your pediatrician regarding the use of this medicine in children. Special care may be needed. Overdosage: If you think you have taken too much of this medicine contact a poison control center or emergency room at once. NOTE: This medicine is only for you. Do not share this medicine with others. What if I miss a dose? It is important not to miss your dose. Call your doctor or health care professional if you are unable to keep an appointment. What may interact with this medicine? Do not take this medicine with any of the following medications: -disulfiram -metronidazole This medicine may also interact with the following medications: -cyclosporine -diazepam -ketoconazole -medicines to increase blood counts like filgrastim, pegfilgrastim, sargramostim -other chemotherapy drugs like cisplatin, doxorubicin,  epirubicin, etoposide, teniposide, vincristine -quinidine -testosterone -vaccines -verapamil Talk to your doctor or health care professional before taking any of these medicines: -acetaminophen -aspirin -ibuprofen -ketoprofen -naproxen This list may not describe all possible interactions. Give your health care provider a list of all the medicines, herbs, non-prescription drugs, or dietary supplements you use. Also tell them if you smoke, drink alcohol, or use illegal drugs. Some items may interact with your medicine. What should I watch for while using this medicine? Your condition will be monitored carefully while you are receiving this medicine. You will need important blood work done while you are taking this medicine. This drug may make you feel generally unwell. This is not uncommon, as chemotherapy can affect healthy cells as well as cancer cells. Report any side effects. Continue your course of treatment even though you feel ill unless your doctor tells you to stop. In some cases, you may be given additional medicines to help with side effects. Follow all directions for their use. Call your doctor or health care professional for advice if you get a fever, chills or sore throat, or other symptoms of a cold or flu. Do not treat yourself. This drug decreases your body's ability to fight infections. Try to avoid being around people who are sick. This medicine may increase your risk to bruise or bleed. Call your doctor or health care professional if you notice any unusual bleeding. Be careful brushing and flossing your teeth or using a toothpick because you may get an infection or bleed more easily. If you have any dental work done, tell your dentist you are receiving this medicine. Avoid taking products that contain aspirin, acetaminophen, ibuprofen, naproxen, or ketoprofen unless instructed by your doctor. These medicines may hide a fever.   Do not become pregnant while taking this medicine.  Women should inform their doctor if they wish to become pregnant or think they might be pregnant. There is a potential for serious side effects to an unborn child. Talk to your health care professional or pharmacist for more information. Do not breast-feed an infant while taking this medicine. Men are advised not to father a child while receiving this medicine. What side effects may I notice from receiving this medicine? Side effects that you should report to your doctor or health care professional as soon as possible: -allergic reactions like skin rash, itching or hives, swelling of the face, lips, or tongue -low blood counts - This drug may decrease the number of white blood cells, red blood cells and platelets. You may be at increased risk for infections and bleeding. -signs of infection - fever or chills, cough, sore throat, pain or difficulty passing urine -signs of decreased platelets or bleeding - bruising, pinpoint red spots on the skin, black, tarry stools, nosebleeds -signs of decreased red blood cells - unusually weak or tired, fainting spells, lightheadedness -breathing problems -chest pain -high or low blood pressure -mouth sores -nausea and vomiting -pain, swelling, redness or irritation at the injection site -pain, tingling, numbness in the hands or feet -slow or irregular heartbeat -swelling of the ankle, feet, hands Side effects that usually do not require medical attention (report to your doctor or health care professional if they continue or are bothersome): -bone pain -complete hair loss including hair on your head, underarms, pubic hair, eyebrows, and eyelashes -changes in the color of fingernails -diarrhea -loosening of the fingernails -loss of appetite -muscle or joint pain -red flush to skin -sweating This list may not describe all possible side effects. Call your doctor for medical advice about side effects. You may report side effects to FDA at  1-800-FDA-1088. Where should I keep my medicine? This drug is given in a hospital or clinic and will not be stored at home. NOTE: This sheet is a summary. It may not cover all possible information. If you have questions about this medicine, talk to your doctor, pharmacist, or health care provider.  2015, [employer]. (2012-09-22 16:41:21) Pertuzumab injection What is this medicine? PERTUZUMAB (per TOOZ ue mab) is a monoclonal antibody that targets a protein called HER2. HER2 is found in some breast cancers. This medicine can stop cancer cell growth. This medicine is used with other cancer treatments. This medicine may be used for other purposes; ask your health care provider or pharmacist if you have questions. COMMON BRAND NAME(S): PERJETA What should I tell my health care provider before I take this medicine? They need to know if you have any of these conditions: -heart disease -heart failure -high blood pressure -history of irregular heart beat -recent or ongoing radiation therapy -an unusual or allergic reaction to pertuzumab, other medicines, foods, dyes, or preservatives -pregnant or trying to get pregnant -breast-feeding How should I use this medicine? This medicine is for infusion into a vein. It is given by a health care professional in a hospital or clinic setting. Talk to your pediatrician regarding the use of this medicine in children. Special care may be needed. Overdosage: If you think you've taken too much of this medicine contact a poison control center or emergency room at once. Overdosage: If you think you have taken too much of this medicine contact a poison control center or emergency room at once. NOTE: This medicine is only for you. Do not   share this medicine with others. What if I miss a dose? It is important not to miss your dose. Call your doctor or health care professional if you are unable to keep an appointment. What may interact with this  medicine? Interactions are not expected. Give your health care provider a list of all the medicines, herbs, non-prescription drugs, or dietary supplements you use. Also tell them if you smoke, drink alcohol, or use illegal drugs. Some items may interact with your medicine. This list may not describe all possible interactions. Give your health care provider a list of all the medicines, herbs, non-prescription drugs, or dietary supplements you use. Also tell them if you smoke, drink alcohol, or use illegal drugs. Some items may interact with your medicine. What should I watch for while using this medicine? Your condition will be monitored carefully while you are receiving this medicine. Report any side effects. Continue your course of treatment even though you feel ill unless your doctor tells you to stop. Do not become pregnant while taking this medicine. Women should inform their doctor if they wish to become pregnant or think they might be pregnant. There is a potential for serious side effects to an unborn child. Talk to your health care professional or pharmacist for more information. Do not breast-feed an infant while taking this medicine. Call your doctor or health care professional for advice if you get a fever, chills or sore throat, or other symptoms of a cold or flu. Do not treat yourself. Try to avoid being around people who are sick. You may experience fever, chills, and headache during the infusion. Report any side effects during the infusion to your health care professional. What side effects may I notice from receiving this medicine? Side effects that you should report to your doctor or health care professional as soon as possible: -breathing problems -chest pain or palpitations -dizziness -feeling faint or lightheaded -fever or chills -skin rash, itching or hives -sore throat -swelling of the face, lips, or tongue -swelling of the legs or ankles -unusually weak or tired Side  effects that usually do not require medical attention (Report these to your doctor or health care professional if they continue or are bothersome.): -diarrhea -hair loss -nausea, vomiting -tiredness This list may not describe all possible side effects. Call your doctor for medical advice about side effects. You may report side effects to FDA at 1-800-FDA-1088. Where should I keep my medicine? This drug is given in a hospital or clinic and will not be stored at home. NOTE: This sheet is a summary. It may not cover all possible information. If you have questions about this medicine, talk to your doctor, pharmacist, or health care provider.  2015, [employer] Standard. (2012-05-28 16:54:15) Trastuzumab injection for infusion What is this medicine? TRASTUZUMAB (tras TOO zoo mab) is a monoclonal antibody. It targets a protein called HER2. This protein is found in some stomach and breast cancers. This medicine can stop cancer cell growth. This medicine may be used with other cancer treatments. This medicine may be used for other purposes; ask your health care provider or pharmacist if you have questions. COMMON BRAND NAME(S): Herceptin What should I tell my health care provider before I take this medicine? They need to know if you have any of these conditions: -heart disease -heart failure -infection (especially a virus infection such as chickenpox, cold sores, or herpes) -lung or breathing disease, like asthma -recent or ongoing radiation therapy -an unusual or allergic reaction to trastuzumab, benzyl  alcohol, or other medications, foods, dyes, or preservatives -pregnant or trying to get pregnant -breast-feeding How should I use this medicine? This drug is given as an infusion into a vein. It is administered in a hospital or clinic by a specially trained health care professional. Talk to your pediatrician regarding the use of this medicine in children. This medicine is not approved for use  in children. Overdosage: If you think you have taken too much of this medicine contact a poison control center or emergency room at once. NOTE: This medicine is only for you. Do not share this medicine with others. What if I miss a dose? It is important not to miss a dose. Call your doctor or health care professional if you are unable to keep an appointment. What may interact with this medicine? -cyclophosphamide -doxorubicin -warfarin This list may not describe all possible interactions. Give your health care provider a list of all the medicines, herbs, non-prescription drugs, or dietary supplements you use. Also tell them if you smoke, drink alcohol, or use illegal drugs. Some items may interact with your medicine. What should I watch for while using this medicine? Visit your doctor for checks on your progress. Report any side effects. Continue your course of treatment even though you feel ill unless your doctor tells you to stop. Call your doctor or health care professional for advice if you get a fever, chills or sore throat, or other symptoms of a cold or flu. Do not treat yourself. Try to avoid being around people who are sick. You may experience fever, chills and shaking during your first infusion. These effects are usually mild and can be treated with other medicines. Report any side effects during the infusion to your health care professional. Fever and chills usually do not happen with later infusions. What side effects may I notice from receiving this medicine? Side effects that you should report to your doctor or other health care professional as soon as possible: -breathing difficulties -chest pain or palpitations -cough -dizziness or fainting -fever or chills, sore throat -skin rash, itching or hives -swelling of the legs or ankles -unusually weak or tired Side effects that usually do not require medical attention (report to your doctor or other health care professional if they  continue or are bothersome): -loss of appetite -headache -muscle aches -nausea This list may not describe all possible side effects. Call your doctor for medical advice about side effects. You may report side effects to FDA at 1-800-FDA-1088. Where should I keep my medicine? This drug is given in a hospital or clinic and will not be stored at home. NOTE: This sheet is a summary. It may not cover all possible information. If you have questions about this medicine, talk to your doctor, pharmacist, or health care provider.  2015, [employer] Standard. (2009-06-03 13:43:15)  

## 2015-03-01 NOTE — Patient Instructions (Signed)

## 2015-03-08 ENCOUNTER — Ambulatory Visit (HOSPITAL_BASED_OUTPATIENT_CLINIC_OR_DEPARTMENT_OTHER): Payer: BLUE CROSS/BLUE SHIELD

## 2015-03-08 ENCOUNTER — Ambulatory Visit: Payer: BLUE CROSS/BLUE SHIELD

## 2015-03-08 ENCOUNTER — Other Ambulatory Visit (HOSPITAL_BASED_OUTPATIENT_CLINIC_OR_DEPARTMENT_OTHER): Payer: BLUE CROSS/BLUE SHIELD

## 2015-03-08 VITALS — BP 163/79 | HR 104 | Temp 98.7°F | Resp 20

## 2015-03-08 DIAGNOSIS — Z5111 Encounter for antineoplastic chemotherapy: Secondary | ICD-10-CM

## 2015-03-08 DIAGNOSIS — C7801 Secondary malignant neoplasm of right lung: Secondary | ICD-10-CM

## 2015-03-08 DIAGNOSIS — C7800 Secondary malignant neoplasm of unspecified lung: Principal | ICD-10-CM

## 2015-03-08 DIAGNOSIS — C7802 Secondary malignant neoplasm of left lung: Secondary | ICD-10-CM | POA: Diagnosis not present

## 2015-03-08 DIAGNOSIS — Z95828 Presence of other vascular implants and grafts: Secondary | ICD-10-CM

## 2015-03-08 DIAGNOSIS — C50911 Malignant neoplasm of unspecified site of right female breast: Secondary | ICD-10-CM | POA: Diagnosis not present

## 2015-03-08 DIAGNOSIS — D6481 Anemia due to antineoplastic chemotherapy: Secondary | ICD-10-CM

## 2015-03-08 LAB — COMPREHENSIVE METABOLIC PANEL (CC13)
ALBUMIN: 3.8 g/dL (ref 3.5–5.0)
ALT: 21 U/L (ref 0–55)
ANION GAP: 9 meq/L (ref 3–11)
AST: 21 U/L (ref 5–34)
Alkaline Phosphatase: 57 U/L (ref 40–150)
BUN: 17.6 mg/dL (ref 7.0–26.0)
CO2: 24 mEq/L (ref 22–29)
CREATININE: 0.9 mg/dL (ref 0.6–1.1)
Calcium: 9.2 mg/dL (ref 8.4–10.4)
Chloride: 107 mEq/L (ref 98–109)
EGFR: >90 mL/min/{1.73_m2} (ref >90)
GLUCOSE: 89 mg/dL (ref 70–140)
POTASSIUM: 4 meq/L (ref 3.5–5.1)
Sodium: 139 mEq/L (ref 136–145)
Total Bilirubin: 0.42 mg/dL (ref 0.20–1.20)
Total Protein: 7.1 g/dL (ref 6.4–8.3)

## 2015-03-08 LAB — CBC WITH DIFFERENTIAL/PLATELET
BASO%: 0.5 % (ref 0.0–2.0)
Basophils Absolute: 0 10*3/uL (ref 0.0–0.1)
EOS%: 1.4 % (ref 0.0–7.0)
Eosinophils Absolute: 0.1 10*3/uL (ref 0.0–0.5)
HEMATOCRIT: 35.1 % (ref 34.8–46.6)
HGB: 11.4 g/dL — ABNORMAL LOW (ref 11.6–15.9)
LYMPH#: 2.2 10*3/uL (ref 0.9–3.3)
LYMPH%: 33.5 % (ref 14.0–49.7)
MCH: 29.9 pg (ref 25.1–34.0)
MCHC: 32.5 g/dL (ref 31.5–36.0)
MCV: 92.1 fL (ref 79.5–101.0)
MONO#: 0.5 10*3/uL (ref 0.1–0.9)
MONO%: 8.1 % (ref 0.0–14.0)
NEUT%: 56.5 % (ref 38.4–76.8)
NEUTROS ABS: 3.6 10*3/uL (ref 1.5–6.5)
PLATELETS: 290 10*3/uL (ref 145–400)
RBC: 3.81 10*6/uL (ref 3.70–5.45)
RDW: 14.8 % — AB (ref 11.2–14.5)
WBC: 6.4 10*3/uL (ref 3.9–10.3)

## 2015-03-08 MED ORDER — DIPHENHYDRAMINE HCL 50 MG/ML IJ SOLN
INTRAMUSCULAR | Status: AC
  Filled 2015-03-08: qty 1

## 2015-03-08 MED ORDER — SODIUM CHLORIDE 0.9 % IJ SOLN
10.0000 mL | INTRAMUSCULAR | Status: DC | PRN
  Administered 2015-03-08: 10 mL via INTRAVENOUS
  Filled 2015-03-08: qty 10

## 2015-03-08 MED ORDER — PACLITAXEL CHEMO INJECTION 300 MG/50ML
60.0000 mg/m2 | Freq: Once | INTRAVENOUS | Status: AC
  Administered 2015-03-08: 132 mg via INTRAVENOUS
  Filled 2015-03-08: qty 22

## 2015-03-08 MED ORDER — SODIUM CHLORIDE 0.9 % IV SOLN
Freq: Once | INTRAVENOUS | Status: AC
  Administered 2015-03-08: 15:00:00 via INTRAVENOUS
  Filled 2015-03-08: qty 4

## 2015-03-08 MED ORDER — FAMOTIDINE IN NACL 20-0.9 MG/50ML-% IV SOLN
20.0000 mg | Freq: Once | INTRAVENOUS | Status: AC
  Administered 2015-03-08: 20 mg via INTRAVENOUS

## 2015-03-08 MED ORDER — FAMOTIDINE IN NACL 20-0.9 MG/50ML-% IV SOLN
INTRAVENOUS | Status: AC
  Filled 2015-03-08: qty 50

## 2015-03-08 MED ORDER — DIPHENHYDRAMINE HCL 50 MG/ML IJ SOLN
50.0000 mg | Freq: Once | INTRAMUSCULAR | Status: AC
  Administered 2015-03-08: 50 mg via INTRAVENOUS

## 2015-03-08 MED ORDER — SODIUM CHLORIDE 0.9 % IJ SOLN
10.0000 mL | INTRAMUSCULAR | Status: DC | PRN
  Administered 2015-03-08: 10 mL
  Filled 2015-03-08: qty 10

## 2015-03-08 MED ORDER — SODIUM CHLORIDE 0.9 % IV SOLN
Freq: Once | INTRAVENOUS | Status: AC
  Administered 2015-03-08: 14:00:00 via INTRAVENOUS

## 2015-03-08 MED ORDER — HEPARIN SOD (PORK) LOCK FLUSH 100 UNIT/ML IV SOLN
500.0000 [IU] | Freq: Once | INTRAVENOUS | Status: AC | PRN
  Administered 2015-03-08: 500 [IU]
  Filled 2015-03-08: qty 5

## 2015-03-08 NOTE — Progress Notes (Signed)
Hands and feet to ice 15 minutes prior to Taxol and during Taxol infusion per pt request.

## 2015-03-08 NOTE — Patient Instructions (Signed)
[facility] Discharge Instructions for Patients Receiving Chemotherapy  Today you received the following chemotherapy agents:  Taxol  To help prevent nausea and vomiting after your treatment, we encourage you to take your nausea medication as ordered per MD.   If you develop nausea and vomiting that is not controlled by your nausea medication, call the clinic.   BELOW ARE SYMPTOMS THAT SHOULD BE REPORTED IMMEDIATELY:  *FEVER GREATER THAN 100.5 F  *CHILLS WITH OR WITHOUT FEVER  NAUSEA AND VOMITING THAT IS NOT CONTROLLED WITH YOUR NAUSEA MEDICATION  *UNUSUAL SHORTNESS OF BREATH  *UNUSUAL BRUISING OR BLEEDING  TENDERNESS IN MOUTH AND THROAT WITH OR WITHOUT PRESENCE OF ULCERS  *URINARY PROBLEMS  *BOWEL PROBLEMS  UNUSUAL RASH Items with * indicate a potential emergency and should be followed up as soon as possible.  Feel free to call the clinic you have any questions or concerns. The clinic phone number is [phone].  Please show the CHEMO ALERT CARD at check-in to the Emergency Department and triage nurse.   

## 2015-03-08 NOTE — Patient Instructions (Signed)

## 2015-03-15 ENCOUNTER — Encounter: Payer: Self-pay | Admitting: Hematology

## 2015-03-15 ENCOUNTER — Ambulatory Visit (HOSPITAL_BASED_OUTPATIENT_CLINIC_OR_DEPARTMENT_OTHER): Payer: BLUE CROSS/BLUE SHIELD

## 2015-03-15 ENCOUNTER — Ambulatory Visit: Payer: BLUE CROSS/BLUE SHIELD

## 2015-03-15 ENCOUNTER — Other Ambulatory Visit (HOSPITAL_BASED_OUTPATIENT_CLINIC_OR_DEPARTMENT_OTHER): Payer: BLUE CROSS/BLUE SHIELD

## 2015-03-15 ENCOUNTER — Ambulatory Visit (HOSPITAL_BASED_OUTPATIENT_CLINIC_OR_DEPARTMENT_OTHER): Payer: BLUE CROSS/BLUE SHIELD | Admitting: Hematology

## 2015-03-15 VITALS — BP 155/83 | HR 104 | Temp 98.4°F | Resp 18 | Ht 70.0 in | Wt 217.5 lb

## 2015-03-15 VITALS — HR 102

## 2015-03-15 DIAGNOSIS — C7800 Secondary malignant neoplasm of unspecified lung: Principal | ICD-10-CM

## 2015-03-15 DIAGNOSIS — C50311 Malignant neoplasm of lower-inner quadrant of right female breast: Secondary | ICD-10-CM

## 2015-03-15 DIAGNOSIS — D6481 Anemia due to antineoplastic chemotherapy: Secondary | ICD-10-CM

## 2015-03-15 DIAGNOSIS — C7801 Secondary malignant neoplasm of right lung: Secondary | ICD-10-CM

## 2015-03-15 DIAGNOSIS — C7802 Secondary malignant neoplasm of left lung: Secondary | ICD-10-CM | POA: Diagnosis not present

## 2015-03-15 DIAGNOSIS — C50911 Malignant neoplasm of unspecified site of right female breast: Secondary | ICD-10-CM

## 2015-03-15 DIAGNOSIS — G620 Drug-induced polyneuropathy: Secondary | ICD-10-CM

## 2015-03-15 DIAGNOSIS — D630 Anemia in neoplastic disease: Secondary | ICD-10-CM

## 2015-03-15 DIAGNOSIS — Z95828 Presence of other vascular implants and grafts: Secondary | ICD-10-CM

## 2015-03-15 DIAGNOSIS — R109 Unspecified abdominal pain: Secondary | ICD-10-CM

## 2015-03-15 DIAGNOSIS — L608 Other nail disorders: Secondary | ICD-10-CM

## 2015-03-15 DIAGNOSIS — Z5111 Encounter for antineoplastic chemotherapy: Secondary | ICD-10-CM

## 2015-03-15 DIAGNOSIS — Z171 Estrogen receptor negative status [ER-]: Secondary | ICD-10-CM | POA: Diagnosis not present

## 2015-03-15 DIAGNOSIS — R21 Rash and other nonspecific skin eruption: Secondary | ICD-10-CM

## 2015-03-15 DIAGNOSIS — R197 Diarrhea, unspecified: Secondary | ICD-10-CM

## 2015-03-15 DIAGNOSIS — T451X5A Adverse effect of antineoplastic and immunosuppressive drugs, initial encounter: Secondary | ICD-10-CM

## 2015-03-15 LAB — CBC WITH DIFFERENTIAL/PLATELET
BASO%: 0.5 % (ref 0.0–2.0)
Basophils Absolute: 0 10*3/uL (ref 0.0–0.1)
EOS%: 1.1 % (ref 0.0–7.0)
Eosinophils Absolute: 0.1 10*3/uL (ref 0.0–0.5)
HCT: 32.3 % — ABNORMAL LOW (ref 34.8–46.6)
HGB: 10.8 g/dL — ABNORMAL LOW (ref 11.6–15.9)
LYMPH#: 1.9 10*3/uL (ref 0.9–3.3)
LYMPH%: 30.8 % (ref 14.0–49.7)
MCH: 30 pg (ref 25.1–34.0)
MCHC: 33.4 g/dL (ref 31.5–36.0)
MCV: 89.9 fL (ref 79.5–101.0)
MONO#: 0.4 10*3/uL (ref 0.1–0.9)
MONO%: 7 % (ref 0.0–14.0)
NEUT%: 60.6 % (ref 38.4–76.8)
NEUTROS ABS: 3.8 10*3/uL (ref 1.5–6.5)
Platelets: 273 10*3/uL (ref 145–400)
RBC: 3.6 10*6/uL — ABNORMAL LOW (ref 3.70–5.45)
RDW: 15.5 % — ABNORMAL HIGH (ref 11.2–14.5)
WBC: 6.3 10*3/uL (ref 3.9–10.3)

## 2015-03-15 LAB — COMPREHENSIVE METABOLIC PANEL (CC13)
ALBUMIN: 3.5 g/dL (ref 3.5–5.0)
ALT: 19 U/L (ref 0–55)
AST: 19 U/L (ref 5–34)
Alkaline Phosphatase: 60 U/L (ref 40–150)
Anion Gap: 6 mEq/L (ref 3–11)
BUN: 14.4 mg/dL (ref 7.0–26.0)
CHLORIDE: 108 meq/L (ref 98–109)
CO2: 28 meq/L (ref 22–29)
Calcium: 9.1 mg/dL (ref 8.4–10.4)
Creatinine: 0.8 mg/dL (ref 0.6–1.1)
Glucose: 104 mg/dl (ref 70–140)
POTASSIUM: 3.5 meq/L (ref 3.5–5.1)
Sodium: 142 mEq/L (ref 136–145)
Total Bilirubin: 0.4 mg/dL (ref 0.20–1.20)
Total Protein: 6.6 g/dL (ref 6.4–8.3)

## 2015-03-15 MED ORDER — DIPHENHYDRAMINE HCL 50 MG/ML IJ SOLN
50.0000 mg | Freq: Once | INTRAMUSCULAR | Status: AC
  Administered 2015-03-15: 50 mg via INTRAVENOUS

## 2015-03-15 MED ORDER — SODIUM CHLORIDE 0.9 % IV SOLN
Freq: Once | INTRAVENOUS | Status: AC
  Administered 2015-03-15: 15:00:00 via INTRAVENOUS
  Filled 2015-03-15: qty 4

## 2015-03-15 MED ORDER — DIPHENHYDRAMINE HCL 50 MG/ML IJ SOLN
INTRAMUSCULAR | Status: AC
  Filled 2015-03-15: qty 1

## 2015-03-15 MED ORDER — SODIUM CHLORIDE 0.9 % IJ SOLN
10.0000 mL | INTRAMUSCULAR | Status: DC | PRN
  Administered 2015-03-15: 10 mL via INTRAVENOUS
  Filled 2015-03-15: qty 10

## 2015-03-15 MED ORDER — SODIUM CHLORIDE 0.9 % IJ SOLN
10.0000 mL | INTRAMUSCULAR | Status: DC | PRN
  Administered 2015-03-15: 10 mL
  Filled 2015-03-15: qty 10

## 2015-03-15 MED ORDER — FAMOTIDINE IN NACL 20-0.9 MG/50ML-% IV SOLN
20.0000 mg | Freq: Once | INTRAVENOUS | Status: AC
  Administered 2015-03-15: 20 mg via INTRAVENOUS

## 2015-03-15 MED ORDER — SODIUM CHLORIDE 0.9 % IV SOLN
Freq: Once | INTRAVENOUS | Status: AC
  Administered 2015-03-15: 15:00:00 via INTRAVENOUS

## 2015-03-15 MED ORDER — FAMOTIDINE IN NACL 20-0.9 MG/50ML-% IV SOLN
INTRAVENOUS | Status: AC
  Filled 2015-03-15: qty 50

## 2015-03-15 MED ORDER — PACLITAXEL CHEMO INJECTION 300 MG/50ML
60.0000 mg/m2 | Freq: Once | INTRAVENOUS | Status: AC
  Administered 2015-03-15: 132 mg via INTRAVENOUS
  Filled 2015-03-15: qty 22

## 2015-03-15 MED ORDER — HEPARIN SOD (PORK) LOCK FLUSH 100 UNIT/ML IV SOLN
500.0000 [IU] | Freq: Once | INTRAVENOUS | Status: AC | PRN
  Administered 2015-03-15: 500 [IU]
  Filled 2015-03-15: qty 5

## 2015-03-15 NOTE — Progress Notes (Signed)
[facility]  Follow up note   Patient Care Team: [name], DO as PCP - General (Family Medicine) [name], DO (Family Medicine) Holley Bouche, NP as Nurse Practitioner (Nurse Practitioner) Stark Klein, MD as Consulting Physician (General Surgery) Arloa Koh, MD as Consulting Physician (Radiation Oncology) [name], MD as Consulting Physician (Hematology)  CHIEF COMPLAINTS Follow up metastatic breast cancer  Oncology History   Breast cancer 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   Staging form: Breast, AJCC 7th Edition     Clinical stage from 07/22/2014: Stage IV (T3, N1, M1) - Unsigned       Breast cancer 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   07/02/2014 Mammogram Mammogram showed a 2cm right beast mass and a 1.8cm right axillary node. MRI breast on 07/16/2014 showed 7cm R breast lesion and 4.4cm r axillary node    07/02/2014 Imaging CT CAP: a 4.7cm mass in LUL lung and a 2.1cm mas in RML, and a small nodule in RUL, suspecious for metastasis     07/09/2014 Initial Diagnosis right IDA with b/l lung lesions, ER-/PR-/HER2+   07/09/2014 Initial Biopsy US guided right breast mass and axillary node biopsy showed IDA, and DCIS, ER-/PR-/HER2+   07/26/2014 Pathologic Stage Left lung mass by IR, path revealed high grade carcinoma, morphology similar to breast tumor biopsy, TTF(-), NapsinA(-), ER(-)   08/04/2014 -  Chemotherapy weekly Paclitaxel 92m/m2, trastuzumab and pertuzumab every 3 weeks   10/04/2014 Imaging Interval decrease in the right axillary lymphadenopathy. Bilateral pulmonary lesions with left hilar lymphadenopathy also markedly decreased in the interval. The left hilar lymphadenopathy has resolved.   12/20/2014 Imaging restaging CT showed stable disease, no new lesions     CURRENT THERAPY: weekly Paclitaxel 849mm2 (changed to 70 mg/m from cycle 16 due to abdominal cramps, 60 mg/m since cycles 22nd due to neuropathy), trastuzumab and pertuzumab every 3 weeks, started on  08/04/2014  INTERVAL HISTROY   [name] [name] for follow-up and 33th dose of Taxol. [facility], and developed some back pain the day after. She has been using massage and heating pad in the past few days, and did feel better today. No radiating pain to legs. She otherwise feels well, no other new complains.   MEDICAL HISTORY:  Past Medical History  Diagnosis Date  . Breast cancer   . Pneumonia     hx of pneumonia 08/2013   . GERD (gastroesophageal reflux disease)     during pregnancy   . Breast cancer     SURGICAL HISTORY: Past Surgical History  Procedure Laterality Date  . Cesarean section    . Essure tubal ligation    . Cholecystectomy    . Wisdom tooth extraction    . Portacath placement Left 07/21/2014    Procedure: INSERTION PORT-A-CATH;  Surgeon: FaStark KleinMD;  Location: [facility];  Service: General;  Laterality: Left;    SOCIAL HISTORY: History   Social History  . Marital Status: Married    Spouse Name: N/A    Number of Children:  she has 2 daughters at the age of [age]  . Years of Education: N/A   Occupational History  .  works as maFreight forwarderor a caFilm/video editor   Social History Main Topics  . Smoking status: Never Smoker   . Smokeless tobacco: Not on file  . Alcohol Use: Yes  . Drug Use: No  . Sexual Activity: No    FAMILY HISTORY: Family History  Problem Relation Age of Onset  . Breast cancer Mother 4  . Liver cancer Father 83  . Breast cancer Maternal Aunt 36  . Prostate cancer Maternal Grandfather   . Liver cancer Paternal Grandmother   . Prostate cancer Paternal Grandfather      GENETICS: 08/30/2014 BreastNext panel was negative. 17 genes including BRCA1, BRCA2, were negative for mutations.   ALLERGIES:  is allergic to aspirin and codeine.  MEDICATIONS:  Current Outpatient Prescriptions  Medication Sig Dispense Refill  . Camphor-Eucalyptus-Menthol (VICKS VAPORUB EX) Apply 1  application topically at bedtime. Applies under nose, throat and on chest.    . cetirizine (ZYRTEC) 10 MG tablet Take 10 mg by mouth daily as needed for allergies (allergies).     . clindamycin (CLINDAGEL) 1 % gel Apply topically to affected areas BID. 60 g 3  . diphenhydrAMINE (SOMINEX) 25 MG tablet Take 50 mg by mouth at bedtime as needed for itching (itching).     . hydrocortisone 2.5 % cream APPLY TOPICALLY TO THE AFFECTED AREA TWICE DAILY 454 g 0  . HYDROCORTISONE, TOPICAL, 2 % LOTN Apply topically to affected areas BID. 29.6 mL 1  . lidocaine-prilocaine (EMLA) cream Apply topically as needed. 30 g 1  . loperamide (IMODIUM A-D) 2 MG tablet Take 1 tablet (2 mg total) by mouth 4 (four) times daily as needed for diarrhea or loose stools. 30 tablet 0  . LORazepam (ATIVAN) 1 MG tablet Take 1 tablet (1 mg total) by mouth once. 2 tablet 0  . ondansetron (ZOFRAN) 8 MG tablet Take 1 tablet (8 mg total) by mouth every 8 (eight) hours as needed for nausea or vomiting. 30 tablet 3  . pantoprazole (PROTONIX) 20 MG tablet Take 1 tablet (20 mg total) by mouth daily. 30 tablet 3  . Pseudoeph-Doxylamine-DM-APAP (NYQUIL PO) Take 30 mLs by mouth 2 (two) times daily as needed (cold/allergies).     . zolpidem (AMBIEN) 5 MG tablet Take 1 tablet (5 mg total) by mouth at bedtime as needed for sleep. 20 tablet 0  . oxyCODONE-acetaminophen (ROXICET) 5-325 MG per tablet Take 1-2 tablets by mouth every 4 (four) hours as needed for severe pain. (Patient not taking: Reported on 03/15/2015) 30 tablet 0   No current facility-administered medications for this visit.   Facility-Administered Medications Ordered in Other Visits  Medication Dose Route Frequency Provider Last Rate Last Dose  . sodium chloride 0.9 % injection 10 mL  10 mL Intravenous PRN [name], MD   10 mL at 03/15/15 1335    REVIEW OF SYSTEMS:   Constitutional: Denies fevers, chills or abnormal night sweats, (+) malaise  Eyes: Denies blurriness of vision,  double vision or watery eyes Ears, nose, mouth, throat, and face: Denies mucositis or sore throat Respiratory: (+) productive cough, no dyspnea or wheezes Cardiovascular: Denies palpitation, chest discomfort or lower extremity swelling Gastrointestinal:  Denies nausea, heartburn or change in bowel habits Skin:(+) rashes  Lymphatics: Denies new lymphadenopathy or easy bruising Neurological:Denies numbness, tingling or new weaknesses Behavioral/Psych: Mood is stable, no new changes  All other systems were reviewed with the patient and are negative.  PHYSICAL EXAMINATION: ECOG PERFORMANCE STATUS: 1 BP 155/83 mmHg  Pulse 104  Temp(Src) 98.4 F (36.9 C) (Oral)  Resp 18  Ht 5' 10"  (1.778 m)  Wt 217 lb 8 oz (98.657 kg)  BMI 31.21 kg/m2  SpO2 100% GENERAL:alert, no distress and comfortable SKIN: skin color, texture, turgor are normal, no rashes or significant lesions EYES: normal, conjunctiva  are pink and non-injected, sclera clear OROPHARYNX:no exudate, no erythema and lips, buccal mucosa, and tongue normal  NECK: supple, thyroid normal size, non-tender, without nodularity LYMPH:  no palpable lymphadenopathy in the cervical, axillary or inguinal LUNGS: clear to auscultation and percussion with normal breathing effort HEART: regular rate & rhythm and no murmurs and no lower extremity edema ABDOMEN:abdomen soft, non-tender and normal bowel sounds Musculoskeletal:no cyanosis of digits and no clubbing  PSYCH: alert & oriented x 3 with fluent speech NEURO: no focal motor/sensory deficits Breasts: Breast inspection showed them to be symmetrical with no skin change or nipple discharge. Palpation of the right breast showed no palpable mass in the lower inner quadrant, (+) fullness in this area, and the previously palpable right axillary node is not palpable anymore. Left breast  and axilla revealed no obvious mass that I could appreciate. Skin: (+) Skin pigmentation on hands and feet.  a few  acne-like rash on face and neck. No new skin rashes. (+)  pigmentation on fingernails, and bulging of fingernails and thickening of soft tissue under nailbed. The right big toenail has felt off, with a small open pink area, no discharge   LABORATORY DATA:  I have reviewed the data as listed CBC Latest Ref Rng 03/15/2015 03/08/2015 03/01/2015  WBC 3.9 - 10.3 10e3/uL 6.3 6.4 6.7  Hemoglobin 11.6 - 15.9 g/dL 10.8(L) 11.4(L) 10.9(L)  Hematocrit 34.8 - 46.6 % 32.3(L) 35.1 33.8(L)  Platelets 145 - 400 10e3/uL 273 290 302    CMP Latest Ref Rng 03/15/2015 03/08/2015 03/01/2015  Glucose 70 - 140 mg/dl 104 89 96  BUN 7.0 - 26.0 mg/dL 14.4 17.6 12.3  Creatinine 0.6 - 1.1 mg/dL 0.8 0.9 0.8  Sodium 136 - 145 mEq/L 142 139 140  Potassium 3.5 - 5.1 mEq/L 3.5 4.0 3.9  Chloride 96 - 112 mmol/L - - -  CO2 22 - 29 mEq/L 28 24 25   Calcium 8.4 - 10.4 mg/dL 9.1 9.2 9.4  Total Protein 6.4 - 8.3 g/dL 6.6 7.1 6.8  Total Bilirubin 0.20 - 1.20 mg/dL 0.40 0.42 0.42  Alkaline Phos 40 - 150 U/L 60 57 59  AST 5 - 34 U/L 19 21 19   ALT 0 - 55 U/L 19 21 20      PATHOLOGY REPORT 07/09/2014 #1 breast, right needle core biopsy more anterior -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 45)  Lung, biopsy, Left 07/26/2014 - HIGH GRADE CARCINOMA, SEE COMMENT. Microscopic Comment The carcinoma demonstrates the following immunophenotype: TTF-1 - negative expression. Napsin A- negative expression. CK5/6 - focal, moderate expression. estrogen receptor - negative expression. GCDFP- negative expression. The recent breast biopsy demonstrating Her2 amplified high grade invasive mammary carcinoma is noted ([id number]). Although the immunophenotype of the current case is non-sepcific, it strongly argues  against primary lung adenocarcinoma. However, on re-review, the morphology of the current case is essentially identical to the primary mammary carcinoma. In lieu of further immunophenotyping, the tumor will be submitted for Her2 testing and the remaining tissue will be reserved for additional ancillary tumor testing. The results of the Her2 testing will be reported in an addendum. The case was discussed with Dr Burr Medico on 07/28/2015 and 07/29/2015 HER2 POSITIVE    RADIOGRAPHIC STUDIES: CT CAP 10/04/2014 IMPRESSION: Interval decrease in the right axillary lymphadenopathy.  Bilateral pulmonary lesions with left  hilar lymphadenopathy also markedly decreased in the interval. The left hilar lymphadenopathy has resolved.  3 millimeter nonobstructing right renal stone.   CT chest, abdomen and pelvis with contrast 12/20/2014 IMPRESSION: 1. Today's study is very similar to the prior examination from 10/04/2014, demonstrating stable size of metastatic lesions in the right middle lobe and left upper lobe, and no new evidence of metastatic disease in the thorax. 2. 2.0 x 0.9 cm lesion in the medial aspect of the right breast is similar to prior examination. No residual right axillary lymphadenopathy. 3. As with prior examinations, there is no evidence of metastatic disease to the abdomen or pelvis. 4. 3 mm nonobstructive calculus in the lower pole collecting system of the right kidney.  PET scan 02/21/2015  IMPRESSION: 1. No evidence of metabolically active breast cancer metastasis. 2. Stable parenchymal thickening in the upper lobes at site of prior pulmonary masses. These stable pulmonary parenchymal thicken does not have associated metabolic activity.   ECHO 02/10/2015  Study Conclusions  - Left ventricle: LV longitudinal strain was -18.3% The cavity size was normal. Systolic function was normal. The estimated ejection fraction was in the range of 55% to 60%. Wall motion was  normal; there were no regional wall motion abnormalities. - Mitral valve: There was mild regurgitation. - Right ventricle: The cavity size was mildly dilated. Wall thickness was normal. - Tricuspid valve: There was mild regurgitation. - Pulmonary arteries: PA peak pressure: 33 mm Hg (S).  ASSESSMENT & PLAN:  44 year old African-American female, premenopausal, without significant past medical history, presented with palpable right breast mass and right axillary mass.   1. 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 -I reviewed her PET scan from 02/21/2015, which showed complete metabolic response. CT component showed stable parenchymal thickening of her lung lesions. -We discussed the possibility of breast surgery. Given her complete metabolic response to chemotherapy, it'll be reasonable to have the breast surgery to remove the primary tumor, which will reduce her tumor burden and prolong her life. I have discussed this with her surgeon Dr. Barry Dienes, she will see her in 3-4 weeks. -continue weekly paclitaxel, trastuzumab and pertuzumab every 3 weeks for now. I will likely stop Taxol  One month before surgery, continue due her to antibody for a total of one year then changed to Herceptin maintenance therapy. -I'll obtain a breast MRI in the next week, and present her case in breast tumor conference  -She had normal echo at baseline, repeated one normal. Will monitor ECHO  every 3 months when she is on dual anti-HER-2 therapy, next echo due in sep 2016 - due to her worsening abdominal pain and neuropathy, I have decreased her weekly Taxol dosed to 60 mg/m, will watch her neuropathy carefully.  -Lab reviewed, adequate for treatment, we'll proceed cycle 33Taxol today, cycle 10 Herceptin and Perjeta next  week -The patient is reluctant to have more chemotherapy Taxol, she initially planned to have today's treatment as that her last cycle, and became very upset when I mentioned she may have a few more Taxol treatment. All questions were answered, we'll make a final decision about her Taxol after she sees Dr. Barry Dienes this Friday   2. Skin rashes on face and scalp, likely related to antibody therapy  -The rash on upper chest much improved with topical steroids, near  resolved now  - continue topical steroids and clindamycin gel as needed   3. Nail changes -secondary to Taxol. -continue warm water with viniger soaking once daily, holding ice bag he hands during chemotherapy   4. Abdominal  pain and mild diarrhea -Likely related to Taxol  -near resolved afterTaxol dose reduction.  -Use Imodium as needed for diarrhea   5. G2 peripheral neuropathy, skin pigmentation, hands and feet -Stable. We'll continue to monitor closely.  6. Anemia, secondary to chemotherapy -Stable. No need for transfusion.  Plan -continue chemo today, and will decide if she will have a few more week of Taxol after she sees Dr. Barry Dienes this Friday   -Breast MRI next week  -she will see Dr. Barry Dienes on 8/5 -I will see her back in 4 weeks  -I will call her after tumor conference discussion in 1-2 weeks   I spent about 30 minutes counseling the patient and her family members, total care was about 40 minutes.  [name]  03/15/2015

## 2015-03-15 NOTE — Patient Instructions (Signed)
[facility] Discharge Instructions for Patients Receiving Chemotherapy  Today you received the following chemotherapy agents Taxol   To help prevent nausea and vomiting after your treatment, we encourage you to take your nausea medication as directed.   If you develop nausea and vomiting that is not controlled by your nausea medication, call the clinic.   BELOW ARE SYMPTOMS THAT SHOULD BE REPORTED IMMEDIATELY:  *FEVER GREATER THAN 100.5 F  *CHILLS WITH OR WITHOUT FEVER  NAUSEA AND VOMITING THAT IS NOT CONTROLLED WITH YOUR NAUSEA MEDICATION  *UNUSUAL SHORTNESS OF BREATH  *UNUSUAL BRUISING OR BLEEDING  TENDERNESS IN MOUTH AND THROAT WITH OR WITHOUT PRESENCE OF ULCERS  *URINARY PROBLEMS  *BOWEL PROBLEMS  UNUSUAL RASH Items with * indicate a potential emergency and should be followed up as soon as possible.  Feel free to call the clinic you have any questions or concerns. The clinic phone number is [phone].  Please show the CHEMO ALERT CARD at check-in to the Emergency Department and triage nurse.   

## 2015-03-15 NOTE — Progress Notes (Signed)
HR 102. Dr. Burr Medico aware. Okay to proceed with treatment per Dr. Burr Medico.  Pt states that she does not want to do the ice bath for her hands and feet this treatment. Pt verbalizes understanding of importance of ice baths.

## 2015-03-15 NOTE — Patient Instructions (Signed)

## 2015-03-18 ENCOUNTER — Encounter: Payer: Self-pay | Admitting: General Surgery

## 2015-03-18 ENCOUNTER — Other Ambulatory Visit: Payer: Self-pay | Admitting: General Surgery

## 2015-03-18 ENCOUNTER — Telehealth: Payer: Self-pay | Admitting: Hematology

## 2015-03-18 DIAGNOSIS — C50911 Malignant neoplasm of unspecified site of right female breast: Secondary | ICD-10-CM

## 2015-03-18 NOTE — Telephone Encounter (Signed)
perpof to sch pt appt-cld pt to adv of Breast MRI-gave pt # to call and avd they do screening and she will need to call & scg appt-pt understood.-pt did get updated avs on last trmt date

## 2015-03-18 NOTE — H&P (Signed)
Breast Surgery for cancer  What is a lumpectomy and lymph node biopsy  A lumpectomy is a surgical procedure for removal of a cancerous lump from a woman's breast. Axillary lymph node surgery is removal of some or most of the  lymph nodes under the armpit. These procedures are treatment for breast cancer.   When is it used?   This procedure is a treatment for a cancerous lump in your breast.  Have a sentinel node biopsy.  A sentinel node biopsy is done by injecting a special dye around the cancer and then removing only the lymph nodes affected by the dye (usually 1 to 5  nodes). The dye finds the first lymph nodes to which cancer cells are likely to spread from a tumor. These sentinel nodes are examined for cancer. If cancer is found in them, then an axillary node dissection can be done later.  This is done the day of surgery.   Have the entire breast and lymph nodes removed (a mastectomy). Choose not to have treatment.   You should ask your health care provider about these choices.   How do I prepare for this procedure?   Plan for your care and recovery after the operation. Find someone to drive you home after the surgery and stay with you for a night or two. Allow for time to rest and try to find people to help you with your day-to-day duties.   Follow your health care provider's instructions about not smoking before and after the procedure. Smokers heal more slowly after surgery. They are also more likely to have breathing problems during surgery. For this reason, if you are a smoker, you should quit at least 2 weeks before the procedure. It is best to quit 6 to 8 weeks before surgery. Also, your wounds will heal much better if you do not smoke after the surgery.   You will likely need a radiologic marker placed prior to surgery.  This will be either a seed or a wire.  Seeds are placed 1-5 days prior to surgery and wires are placed the day of surgery.  There are numerous factors that go into  the decision which one you need.    If you need a minor pain reliever in the week before surgery, choose acetaminophen rather than aspirin, ibuprofen, or naproxen. This helps avoid extra bleeding during surgery. If you are taking daily aspirin for a medical condition, ask your provider if you need to stop taking it before your surgery.   You will also get a block the day of surgery that helps with post op pain control.    Follow any other instructions your provider gives you. Eat a light meal, such as soup or salad, the night before the procedure. Do not eat or drink anything after midnight or the morning before the procedure. Do not even drink coffee, tea, or water.   What happens during the procedure?   You will be given a general anesthetic. A general anesthetic relaxes your muscles, puts you to sleep, and prevents you from feeling pain.   The surgeon makes a small cut and removes the lump and surrounding breast tissue. If axillary node dissection is planned, the lymph nodes under your armpit are removed through another cut in the armpit.   If you and your surgeon have chosen sentinel lymph node biopsy instead of axillary node dissection, a special dye is injected around the nipple in the holding area. Then a smaller incision is made  in the armpit and the 1 to 5 nodes containing the dye are removed. You will be told a few days later whether cancer was found in the nodes. If cancer was found, you will need another procedure to remove the remaining lymph nodes (axillary lymph node dissection). Fortunately, most women can avoid full axillary node dissection with this method.   The cuts will be closed with stitches. If you have a full axillary node dissection, a drain will be left in the cut in your armpit for a few days.   What happens after the procedure?   You may go home that day or stay in the hospital overnight. Your health care provider may suggest you start treatment with radiation or  chemotherapy, depending on the results of lab tests. It is helpful to have a family member or friend with you on the first visit after surgery, when you discuss test results.   You will be given a prescription for pain medicine. A nonprescription anti-inflammatory medicine, such as ibuprofen, may give most of the pain relief that you need. Ask your health care provider how to prevent and treat pain.   Ask your provider what other steps you should take and when you should come back for a checkup. If you have an axillary lymph node dissection, your surgeon will give detailed instructions on how to care for the drain under your arm and when to come back for drain removal.   What are the benefits of this procedure?   You may have the cancer removed without losing your breast.   What are the risks associated with this procedure?   There are some risks when you have general anesthesia. Discuss these risks with your health care provider. You may have infection or bleeding. A lumpy scar, called a keloid, might form after the surgery. The underside of your arm will probably be numb after axillary lymph node dissection due to the loss of nerves to the skin. The extent of numbness varies from person to person. Depending on the size of the lump that was taken out and the size of your breast, the shape of your breast may change. Your nipple may point another way and your breasts may not match as well as before the surgery. Lab tests may show that the cancer was not completely removed. If this happens, you will need more surgery. The cancer may come back, but radiation therapy, hormone therapy, and chemotherapy can make this less likely.   You should ask your health care provider how these risks apply to you.   When should I call my health care provider?   Call your provider right away if:   You have a fever of 100F (37.8C) or higher. You have problems with the drain. You have bleeding or weeping  from the wound. You have a lot of pain. (You should not have much pain and it should get better, not worse.)   Call during office hours if:   You have questions about the procedure or its result. You want to make another appointment.

## 2015-03-20 ENCOUNTER — Encounter: Payer: Self-pay | Admitting: Hematology

## 2015-03-21 ENCOUNTER — Other Ambulatory Visit: Payer: Self-pay | Admitting: Hematology

## 2015-03-21 ENCOUNTER — Ambulatory Visit
Admission: RE | Admit: 2015-03-21 | Discharge: 2015-03-21 | Disposition: A | Discharge status: Home / Self Care | Payer: BLUE CROSS/BLUE SHIELD | Source: Ambulatory Visit | Attending: Hematology | Admitting: Hematology

## 2015-03-21 DIAGNOSIS — C50911 Malignant neoplasm of unspecified site of right female breast: Secondary | ICD-10-CM

## 2015-03-21 DIAGNOSIS — C7800 Secondary malignant neoplasm of unspecified lung: Principal | ICD-10-CM

## 2015-03-21 MED ORDER — LORAZEPAM 1 MG PO TABS: 1.0000 mg | ORAL_TABLET | Freq: Once | ORAL | Status: DC

## 2015-03-22 ENCOUNTER — Other Ambulatory Visit (HOSPITAL_BASED_OUTPATIENT_CLINIC_OR_DEPARTMENT_OTHER): Payer: BLUE CROSS/BLUE SHIELD

## 2015-03-22 ENCOUNTER — Ambulatory Visit: Payer: BLUE CROSS/BLUE SHIELD

## 2015-03-22 ENCOUNTER — Encounter: Payer: Self-pay | Admitting: Hematology

## 2015-03-22 ENCOUNTER — Ambulatory Visit (HOSPITAL_BASED_OUTPATIENT_CLINIC_OR_DEPARTMENT_OTHER): Payer: BLUE CROSS/BLUE SHIELD

## 2015-03-22 VITALS — BP 181/76 | HR 102 | Temp 98.2°F | Resp 18

## 2015-03-22 DIAGNOSIS — Z5112 Encounter for antineoplastic immunotherapy: Secondary | ICD-10-CM

## 2015-03-22 DIAGNOSIS — C7801 Secondary malignant neoplasm of right lung: Secondary | ICD-10-CM | POA: Diagnosis not present

## 2015-03-22 DIAGNOSIS — D6481 Anemia due to antineoplastic chemotherapy: Secondary | ICD-10-CM | POA: Diagnosis not present

## 2015-03-22 DIAGNOSIS — C7800 Secondary malignant neoplasm of unspecified lung: Principal | ICD-10-CM

## 2015-03-22 DIAGNOSIS — C50911 Malignant neoplasm of unspecified site of right female breast: Secondary | ICD-10-CM

## 2015-03-22 DIAGNOSIS — C50311 Malignant neoplasm of lower-inner quadrant of right female breast: Secondary | ICD-10-CM

## 2015-03-22 DIAGNOSIS — C7802 Secondary malignant neoplasm of left lung: Secondary | ICD-10-CM

## 2015-03-22 DIAGNOSIS — Z95828 Presence of other vascular implants and grafts: Secondary | ICD-10-CM

## 2015-03-22 LAB — CBC WITH DIFFERENTIAL/PLATELET
BASO%: 0.7 % (ref 0.0–2.0)
Basophils Absolute: 0.1 10*3/uL (ref 0.0–0.1)
EOS ABS: 0.1 10*3/uL (ref 0.0–0.5)
EOS%: 1.5 % (ref 0.0–7.0)
HCT: 33.6 % — ABNORMAL LOW (ref 34.8–46.6)
HEMOGLOBIN: 11.1 g/dL — AB (ref 11.6–15.9)
LYMPH%: 31.3 % (ref 14.0–49.7)
MCH: 29.7 pg (ref 25.1–34.0)
MCHC: 33.1 g/dL (ref 31.5–36.0)
MCV: 89.6 fL (ref 79.5–101.0)
MONO#: 0.5 10*3/uL (ref 0.1–0.9)
MONO%: 7 % (ref 0.0–14.0)
NEUT#: 4.2 10*3/uL (ref 1.5–6.5)
NEUT%: 59.5 % (ref 38.4–76.8)
Platelets: 314 10*3/uL (ref 145–400)
RBC: 3.75 10*6/uL (ref 3.70–5.45)
RDW: 15.6 % — ABNORMAL HIGH (ref 11.2–14.5)
WBC: 7 10*3/uL (ref 3.9–10.3)
lymph#: 2.2 10*3/uL (ref 0.9–3.3)

## 2015-03-22 LAB — COMPREHENSIVE METABOLIC PANEL (CC13)
ALK PHOS: 64 U/L (ref 40–150)
ALT: 21 U/L (ref 0–55)
AST: 18 U/L (ref 5–34)
Albumin: 3.6 g/dL (ref 3.5–5.0)
Anion Gap: 6 mEq/L (ref 3–11)
BUN: 10.7 mg/dL (ref 7.0–26.0)
CALCIUM: 9.3 mg/dL (ref 8.4–10.4)
CO2: 31 meq/L — AB (ref 22–29)
Chloride: 107 mEq/L (ref 98–109)
Creatinine: 0.8 mg/dL (ref 0.6–1.1)
EGFR: >90 mL/min/{1.73_m2} (ref >90)
GLUCOSE: 109 mg/dL (ref 70–140)
POTASSIUM: 3.7 meq/L (ref 3.5–5.1)
Sodium: 143 mEq/L (ref 136–145)
TOTAL PROTEIN: 6.9 g/dL (ref 6.4–8.3)
Total Bilirubin: 0.38 mg/dL (ref 0.20–1.20)

## 2015-03-22 MED ORDER — ACETAMINOPHEN 325 MG PO TABS
ORAL_TABLET | ORAL | Status: AC
  Filled 2015-03-22: qty 2

## 2015-03-22 MED ORDER — SODIUM CHLORIDE 0.9 % IV SOLN
420.0000 mg | Freq: Once | INTRAVENOUS | Status: AC
  Administered 2015-03-22: 420 mg via INTRAVENOUS
  Filled 2015-03-22: qty 14

## 2015-03-22 MED ORDER — ACETAMINOPHEN 325 MG PO TABS
650.0000 mg | ORAL_TABLET | Freq: Once | ORAL | Status: AC
  Administered 2015-03-22: 650 mg via ORAL

## 2015-03-22 MED ORDER — SODIUM CHLORIDE 0.9 % IJ SOLN
10.0000 mL | INTRAMUSCULAR | Status: DC | PRN
  Administered 2015-03-22: 10 mL
  Filled 2015-03-22: qty 10

## 2015-03-22 MED ORDER — METHYLPREDNISOLONE SODIUM SUCC 40 MG IJ SOLR
40.0000 mg | Freq: Once | INTRAMUSCULAR | Status: AC
  Administered 2015-03-22: 40 mg via INTRAVENOUS

## 2015-03-22 MED ORDER — METHYLPREDNISOLONE SODIUM SUCC 40 MG IJ SOLR
INTRAMUSCULAR | Status: AC
  Filled 2015-03-22: qty 1

## 2015-03-22 MED ORDER — TRASTUZUMAB CHEMO INJECTION 440 MG
6.0000 mg/kg | Freq: Once | INTRAVENOUS | Status: AC
  Administered 2015-03-22: 588 mg via INTRAVENOUS
  Filled 2015-03-22: qty 28

## 2015-03-22 MED ORDER — DIPHENHYDRAMINE HCL 25 MG PO CAPS
ORAL_CAPSULE | ORAL | Status: AC
  Filled 2015-03-22: qty 2

## 2015-03-22 MED ORDER — DIPHENHYDRAMINE HCL 25 MG PO CAPS
50.0000 mg | ORAL_CAPSULE | Freq: Once | ORAL | Status: AC
  Administered 2015-03-22: 50 mg via ORAL

## 2015-03-22 MED ORDER — SODIUM CHLORIDE 0.9 % IJ SOLN
10.0000 mL | INTRAMUSCULAR | Status: DC | PRN
  Administered 2015-03-22: 10 mL via INTRAVENOUS
  Filled 2015-03-22: qty 10

## 2015-03-22 MED ORDER — SODIUM CHLORIDE 0.9 % IV SOLN
Freq: Once | INTRAVENOUS | Status: AC
  Administered 2015-03-22: 14:00:00 via INTRAVENOUS

## 2015-03-22 MED ORDER — HEPARIN SOD (PORK) LOCK FLUSH 100 UNIT/ML IV SOLN
500.0000 [IU] | Freq: Once | INTRAVENOUS | Status: AC | PRN
  Administered 2015-03-22: 500 [IU]
  Filled 2015-03-22: qty 5

## 2015-03-22 NOTE — Patient Instructions (Signed)

## 2015-03-22 NOTE — Patient Instructions (Signed)
[facility] Discharge Instructions for Patients Receiving Chemotherapy  Today you received the following chemotherapy agents: Herceptin, Perjeta  To help prevent nausea and vomiting after your treatment, we encourage you to take your nausea medication as prescribed by your physician.   If you develop nausea and vomiting that is not controlled by your nausea medication, call the clinic.   BELOW ARE SYMPTOMS THAT SHOULD BE REPORTED IMMEDIATELY:  *FEVER GREATER THAN 100.5 F  *CHILLS WITH OR WITHOUT FEVER  NAUSEA AND VOMITING THAT IS NOT CONTROLLED WITH YOUR NAUSEA MEDICATION  *UNUSUAL SHORTNESS OF BREATH  *UNUSUAL BRUISING OR BLEEDING  TENDERNESS IN MOUTH AND THROAT WITH OR WITHOUT PRESENCE OF ULCERS  *URINARY PROBLEMS  *BOWEL PROBLEMS  UNUSUAL RASH Items with * indicate a potential emergency and should be followed up as soon as possible.  Feel free to call the clinic you have any questions or concerns. The clinic phone number is [phone].  Please show the [place] at check-in to the Emergency Department and triage nurse.

## 2015-03-22 NOTE — Progress Notes (Signed)
Pt is approved for the $1000 [employer] grant.  

## 2015-03-23 ENCOUNTER — Other Ambulatory Visit: Payer: BLUE CROSS/BLUE SHIELD

## 2015-03-23 ENCOUNTER — Ambulatory Visit
Admission: RE | Admit: 2015-03-23 | Discharge: 2015-03-23 | Disposition: A | Discharge status: Home / Self Care | Payer: BLUE CROSS/BLUE SHIELD | Source: Ambulatory Visit | Attending: Hematology | Admitting: Hematology

## 2015-03-23 MED ORDER — GADOBENATE DIMEGLUMINE 529 MG/ML IV SOLN
19.0000 mL | Freq: Once | INTRAVENOUS | Status: AC | PRN
Start: 2015-03-23 — End: 2015-03-23
  Administered 2015-03-23: 19 mL via INTRAVENOUS

## 2015-03-24 ENCOUNTER — Encounter (HOSPITAL_BASED_OUTPATIENT_CLINIC_OR_DEPARTMENT_OTHER): Payer: Self-pay | Admitting: *Deleted

## 2015-03-28 ENCOUNTER — Other Ambulatory Visit: Payer: Self-pay | Admitting: General Surgery

## 2015-03-28 DIAGNOSIS — C50911 Malignant neoplasm of unspecified site of right female breast: Secondary | ICD-10-CM

## 2015-03-28 NOTE — Progress Notes (Signed)
This encounter was created in error - please disregard.

## 2015-03-29 ENCOUNTER — Encounter (HOSPITAL_BASED_OUTPATIENT_CLINIC_OR_DEPARTMENT_OTHER)
Admission: RE | Disposition: A | Discharge status: Home / Self Care | Payer: Self-pay | Source: Ambulatory Visit | Attending: General Surgery

## 2015-03-29 ENCOUNTER — Encounter (HOSPITAL_BASED_OUTPATIENT_CLINIC_OR_DEPARTMENT_OTHER): Payer: Self-pay | Admitting: *Deleted

## 2015-03-29 ENCOUNTER — Ambulatory Visit (HOSPITAL_BASED_OUTPATIENT_CLINIC_OR_DEPARTMENT_OTHER)
Admission: RE | Admit: 2015-03-29 | Discharge: 2015-03-29 | Disposition: A | Discharge status: Home / Self Care | Payer: BLUE CROSS/BLUE SHIELD | Source: Ambulatory Visit | Attending: General Surgery | Admitting: General Surgery

## 2015-03-29 ENCOUNTER — Encounter (HOSPITAL_COMMUNITY): Payer: BLUE CROSS/BLUE SHIELD

## 2015-03-29 ENCOUNTER — Ambulatory Visit (HOSPITAL_COMMUNITY)
Admission: RE | Admit: 2015-03-29 | Discharge: 2015-03-29 | Disposition: A | Discharge status: Home / Self Care | Payer: BLUE CROSS/BLUE SHIELD | Source: Ambulatory Visit | Attending: General Surgery | Admitting: General Surgery

## 2015-03-29 ENCOUNTER — Ambulatory Visit (HOSPITAL_BASED_OUTPATIENT_CLINIC_OR_DEPARTMENT_OTHER): Payer: BLUE CROSS/BLUE SHIELD | Admitting: Anesthesiology

## 2015-03-29 DIAGNOSIS — K219 Gastro-esophageal reflux disease without esophagitis: Secondary | ICD-10-CM | POA: Diagnosis not present

## 2015-03-29 DIAGNOSIS — C50311 Malignant neoplasm of lower-inner quadrant of right female breast: Secondary | ICD-10-CM | POA: Insufficient documentation

## 2015-03-29 DIAGNOSIS — C50911 Malignant neoplasm of unspecified site of right female breast: Secondary | ICD-10-CM | POA: Diagnosis present

## 2015-03-29 HISTORY — PX: BREAST LUMPECTOMY WITH RADIOACTIVE SEED AND SENTINEL LYMPH NODE BIOPSY: SHX6550

## 2015-03-29 SURGERY — BREAST LUMPECTOMY WITH RADIOACTIVE SEED AND SENTINEL LYMPH NODE BIOPSY
Anesthesia: Regional | Site: Breast | Laterality: Right

## 2015-03-29 MED ORDER — PROMETHAZINE HCL 25 MG/ML IJ SOLN: 6.2500 mg | INTRAMUSCULAR | Status: DC | PRN

## 2015-03-29 MED ORDER — OXYCODONE HCL 5 MG PO TABS
5.0000 mg | ORAL_TABLET | Freq: Once | ORAL | Status: AC | PRN
  Administered 2015-03-29: 5 mg via ORAL

## 2015-03-29 MED ORDER — FENTANYL CITRATE (PF) 100 MCG/2ML IJ SOLN
INTRAMUSCULAR | Status: AC
  Filled 2015-03-29: qty 6

## 2015-03-29 MED ORDER — MIDAZOLAM HCL 2 MG/2ML IJ SOLN
INTRAMUSCULAR | Status: AC
  Filled 2015-03-29: qty 2

## 2015-03-29 MED ORDER — PROPOFOL 10 MG/ML IV BOLUS
INTRAVENOUS | Status: DC | PRN
  Administered 2015-03-29: 200 mg via INTRAVENOUS

## 2015-03-29 MED ORDER — SCOPOLAMINE 1 MG/3DAYS TD PT72: 1.0000 | MEDICATED_PATCH | Freq: Once | TRANSDERMAL | Status: DC | PRN

## 2015-03-29 MED ORDER — GLYCOPYRROLATE 0.2 MG/ML IJ SOLN: 0.2000 mg | Freq: Once | INTRAMUSCULAR | Status: DC | PRN

## 2015-03-29 MED ORDER — HYDROMORPHONE HCL 1 MG/ML IJ SOLN: 0.2500 mg | INTRAMUSCULAR | Status: DC | PRN

## 2015-03-29 MED ORDER — MIDAZOLAM HCL 2 MG/2ML IJ SOLN
1.0000 mg | INTRAMUSCULAR | Status: DC | PRN
  Administered 2015-03-29 (×2): 2 mg via INTRAVENOUS

## 2015-03-29 MED ORDER — MEPERIDINE HCL 25 MG/ML IJ SOLN: 6.2500 mg | INTRAMUSCULAR | Status: DC | PRN

## 2015-03-29 MED ORDER — BUPIVACAINE-EPINEPHRINE (PF) 0.5% -1:200000 IJ SOLN
INTRAMUSCULAR | Status: DC | PRN
  Administered 2015-03-29: 30 mL

## 2015-03-29 MED ORDER — LACTATED RINGERS IV SOLN
INTRAVENOUS | Status: DC
  Administered 2015-03-29 (×3): via INTRAVENOUS

## 2015-03-29 MED ORDER — CEFAZOLIN SODIUM-DEXTROSE 2-3 GM-% IV SOLR
INTRAVENOUS | Status: AC
  Filled 2015-03-29: qty 50

## 2015-03-29 MED ORDER — BUPIVACAINE HCL (PF) 0.25 % IJ SOLN
INTRAMUSCULAR | Status: AC
  Filled 2015-03-29: qty 30

## 2015-03-29 MED ORDER — OXYCODONE-ACETAMINOPHEN 5-325 MG PO TABS: 1.0000 | ORAL_TABLET | ORAL | Status: DC | PRN

## 2015-03-29 MED ORDER — LIDOCAINE-EPINEPHRINE (PF) 1 %-1:200000 IJ SOLN
INTRAMUSCULAR | Status: DC | PRN
  Administered 2015-03-29: 30 mL via INTRAMUSCULAR

## 2015-03-29 MED ORDER — FENTANYL CITRATE (PF) 100 MCG/2ML IJ SOLN
INTRAMUSCULAR | Status: AC
  Filled 2015-03-29: qty 2

## 2015-03-29 MED ORDER — ONDANSETRON HCL 4 MG/2ML IJ SOLN
INTRAMUSCULAR | Status: DC | PRN
  Administered 2015-03-29: 4 mg via INTRAVENOUS

## 2015-03-29 MED ORDER — OXYCODONE HCL 5 MG/5ML PO SOLN: 5.0000 mg | Freq: Once | ORAL | Status: AC | PRN

## 2015-03-29 MED ORDER — METHYLENE BLUE 1 % INJ SOLN
INTRAMUSCULAR | Status: AC
  Filled 2015-03-29: qty 10

## 2015-03-29 MED ORDER — DEXAMETHASONE SODIUM PHOSPHATE 4 MG/ML IJ SOLN
INTRAMUSCULAR | Status: DC | PRN
  Administered 2015-03-29: 10 mg via INTRAVENOUS

## 2015-03-29 MED ORDER — CEFAZOLIN SODIUM-DEXTROSE 2-3 GM-% IV SOLR
2.0000 g | INTRAVENOUS | Status: AC
  Administered 2015-03-29: 2 g via INTRAVENOUS

## 2015-03-29 MED ORDER — OXYCODONE HCL 5 MG PO TABS
ORAL_TABLET | ORAL | Status: AC
  Filled 2015-03-29: qty 1

## 2015-03-29 MED ORDER — TECHNETIUM TC 99M SULFUR COLLOID FILTERED
1.0000 | Freq: Once | INTRAVENOUS | Status: AC | PRN
  Administered 2015-03-29: 1 via INTRADERMAL

## 2015-03-29 MED ORDER — SODIUM CHLORIDE 0.9 % IJ SOLN
INTRAMUSCULAR | Status: AC
  Filled 2015-03-29: qty 10

## 2015-03-29 MED ORDER — FENTANYL CITRATE (PF) 100 MCG/2ML IJ SOLN
50.0000 ug | INTRAMUSCULAR | Status: AC | PRN
  Administered 2015-03-29 (×4): 50 ug via INTRAVENOUS
  Administered 2015-03-29: 100 ug via INTRAVENOUS
  Administered 2015-03-29 (×2): 50 ug via INTRAVENOUS

## 2015-03-29 SURGICAL SUPPLY — 61 items
ADH SKN CLS APL DERMABOND .7 (GAUZE/BANDAGES/DRESSINGS) ×1
BINDER BREAST XLRG (GAUZE/BANDAGES/DRESSINGS) IMPLANT
BINDER BREAST XXLRG (GAUZE/BANDAGES/DRESSINGS) IMPLANT
BLADE HEX COATED 2.75 (ELECTRODE) ×3 IMPLANT
BLADE SURG 10 STRL SS (BLADE) ×3 IMPLANT
BLADE SURG 15 STRL LF DISP TIS (BLADE) ×1 IMPLANT
BLADE SURG 15 STRL SS (BLADE) ×3
BNDG COHESIVE 4X5 TAN STRL (GAUZE/BANDAGES/DRESSINGS) ×3 IMPLANT
CANISTER SUC SOCK COL 7IN (MISCELLANEOUS) IMPLANT
CANISTER SUCT 1200ML W/VALVE (MISCELLANEOUS) ×3 IMPLANT
CHLORAPREP W/TINT 26ML (MISCELLANEOUS) ×3 IMPLANT
CLIP TI LARGE 6 (CLIP) ×3 IMPLANT
CLIP TI MEDIUM 6 (CLIP) ×5 IMPLANT
CLOSURE WOUND 1/2 X4 (GAUZE/BANDAGES/DRESSINGS) ×1
COVER MAYO STAND STRL (DRAPES) ×3 IMPLANT
COVER PROBE W GEL 5X96 (DRAPES) ×3 IMPLANT
DECANTER SPIKE VIAL GLASS SM (MISCELLANEOUS) IMPLANT
DERMABOND ADVANCED (GAUZE/BANDAGES/DRESSINGS) ×2
DERMABOND ADVANCED .7 DNX12 (GAUZE/BANDAGES/DRESSINGS) ×1 IMPLANT
DEVICE DUBIN W/COMP PLATE 8390 (MISCELLANEOUS) ×3 IMPLANT
DRAPE UTILITY XL STRL (DRAPES) ×3 IMPLANT
DRSG PAD ABDOMINAL 8X10 ST (GAUZE/BANDAGES/DRESSINGS) ×2 IMPLANT
ELECT REM PT RETURN 9FT ADLT (ELECTROSURGICAL) ×3
ELECTRODE REM PT RTRN 9FT ADLT (ELECTROSURGICAL) ×1 IMPLANT
GLOVE BIO SURGEON STRL SZ 6 (GLOVE) ×3 IMPLANT
GLOVE BIO SURGEON STRL SZ7 (GLOVE) ×2 IMPLANT
GLOVE BIOGEL PI IND STRL 6.5 (GLOVE) ×1 IMPLANT
GLOVE BIOGEL PI IND STRL 7.5 (GLOVE) IMPLANT
GLOVE BIOGEL PI INDICATOR 6.5 (GLOVE) ×2
GLOVE BIOGEL PI INDICATOR 7.5 (GLOVE) ×2
GOWN STRL REUS W/ TWL LRG LVL3 (GOWN DISPOSABLE) ×1 IMPLANT
GOWN STRL REUS W/TWL 2XL LVL3 (GOWN DISPOSABLE) ×3 IMPLANT
GOWN STRL REUS W/TWL LRG LVL3 (GOWN DISPOSABLE) ×3
KIT MARKER MARGIN INK (KITS) ×3 IMPLANT
NDL HYPO 25X1 1.5 SAFETY (NEEDLE) ×1 IMPLANT
NEEDLE HYPO 25X1 1.5 SAFETY (NEEDLE) ×3 IMPLANT
NS IRRIG 1000ML POUR BTL (IV SOLUTION) IMPLANT
PACK BASIN DAY SURGERY FS (CUSTOM PROCEDURE TRAY) ×3 IMPLANT
PACK UNIVERSAL I (CUSTOM PROCEDURE TRAY) ×3 IMPLANT
PENCIL BUTTON HOLSTER BLD 10FT (ELECTRODE) ×3 IMPLANT
SLEEVE SCD COMPRESS KNEE MED (MISCELLANEOUS) ×3 IMPLANT
SPONGE GAUZE 4X4 12PLY STER LF (GAUZE/BANDAGES/DRESSINGS) ×3 IMPLANT
SPONGE LAP 18X18 X RAY DECT (DISPOSABLE) ×3 IMPLANT
STAPLER VISISTAT 35W (STAPLE) IMPLANT
STOCKINETTE IMPERVIOUS LG (DRAPES) ×3 IMPLANT
STRIP CLOSURE SKIN 1/2X4 (GAUZE/BANDAGES/DRESSINGS) ×2 IMPLANT
SUT ETHILON 2 0 FS 18 (SUTURE) IMPLANT
SUT MNCRL AB 4-0 PS2 18 (SUTURE) ×3 IMPLANT
SUT MON AB 5-0 PS2 18 (SUTURE) IMPLANT
SUT SILK 2 0 SH (SUTURE) IMPLANT
SUT VIC AB 2-0 SH 27 (SUTURE) ×3
SUT VIC AB 2-0 SH 27XBRD (SUTURE) ×1 IMPLANT
SUT VIC AB 3-0 SH 27 (SUTURE) ×3
SUT VIC AB 3-0 SH 27X BRD (SUTURE) ×1 IMPLANT
SUT VIC AB 5-0 PS2 18 (SUTURE) IMPLANT
SYR CONTROL 10ML LL (SYRINGE) ×3 IMPLANT
TOWEL OR 17X24 6PK STRL BLUE (TOWEL DISPOSABLE) ×3 IMPLANT
TOWEL OR NON WOVEN STRL DISP B (DISPOSABLE) ×3 IMPLANT
TUBE CONNECTING 20'X1/4 (TUBING) ×1
TUBE CONNECTING 20X1/4 (TUBING) ×2 IMPLANT
YANKAUER SUCT BULB TIP NO VENT (SUCTIONS) ×2 IMPLANT

## 2015-03-29 NOTE — Op Note (Signed)
Right Breast Radioactive seed localized lumpectomy and sentinel lymph node biopsy  Indications: This patient presents with history of right breast cancer, s/p chemotherapy, [id number].    Pre-operative Diagnosis: See above, lower inner quadrant.    Post-operative Diagnosis: Same  Surgeon: Stark Klein   Anesthesia: General endotracheal anesthesia  ASA Class: 2  Procedure Details  The patient was seen in the Holding Room. The risks, benefits, complications, treatment options, and expected outcomes were discussed with the patient. The possibilities of bleeding, infection, the need for additional procedures, failure to diagnose a condition, and creating a complication requiring transfusion or operation were discussed with the patient. The patient concurred with the proposed plan, giving informed consent.  The site of surgery properly noted/marked. The patient was taken to Operating Room # 6, identified, and the procedure verified as Right Breast Lumpectomy. A Time Out was held and the above information confirmed.  The right arm, breast, and chest were prepped and draped in standard fashion. The lumpectomy was performed by creating an transverse incision over the lower inner quadrant of the breast over the previously placed radioactive seed.  Dissection was carried down to around the point of maximum signal intensity. The cautery was used to perform the dissection.  Hemostasis was achieved with cautery. The edges of the cavity were marked with large clips, with one each medial, lateral, inferior and superior, and two clips posteriorly.   The specimen was inked with the margin marker paint kit.    Specimen radiography confirmed inclusion of the mammographic lesion, the clip, and the seed.  The background signal in the breast was zero.  The wound was irrigated and closed with 3-0 vicryl in layers and 4-0 monocryl subcuticular suture.    Using a hand-held gamma probe, right axillary sentinel nodes were  identified transcutaneously.  An oblique incision was created below the axillary hairline.  Dissection was carried through the clavipectoral fascia.  Five level 1 and 2 axillary sentinel nodes were removed.  Several of these felt like 1-3 matted nodes.  Counts per second were between 55 and 330.    The background count was 15 cps.  The wound was irrigated.  Hemostasis was achieved with cautery.  The axillary incision was closed with a 3-0 vicryl deep dermal interrupted sutures and a 4-0 monocryl subcuticular closure.    Sterile dressings were applied. At the end of the operation, all sponge, instrument, and needle counts were correct.  Findings: grossly clear surgical margins and no adenopathy, posterior margin is pectoralis.   Estimated Blood Loss:  min         Specimens: right breast lumpectomy and five axillary sentinel lymph nodes.             Complications:  None; patient tolerated the procedure well.         Disposition: PACU - hemodynamically stable.         Condition: stable

## 2015-03-29 NOTE — Anesthesia Preprocedure Evaluation (Signed)
Anesthesia Evaluation  Patient identified by MRN, date of birth, ID band Patient awake    Reviewed: Allergy & Precautions, H&P , NPO status , Patient's Chart, lab work & pertinent test results  Airway Mallampati: II  TM Distance: >3 FB Neck ROM: Full    Dental no notable dental hx.    Pulmonary neg pulmonary ROS,  breath sounds clear to auscultation  Pulmonary exam normal       Cardiovascular negative cardio ROS Normal cardiovascular examRhythm:Regular Rate:Normal     Neuro/Psych negative neurological ROS  negative psych ROS   GI/Hepatic Neg liver ROS, GERD-  ,  Endo/Other  negative endocrine ROS  Renal/GU negative Renal ROS     Musculoskeletal negative musculoskeletal ROS (+)   Abdominal   Peds  Hematology  (+) anemia ,   Anesthesia Other Findings   Reproductive/Obstetrics negative OB ROS                             Anesthesia Physical  Anesthesia Plan  ASA: II  Anesthesia Plan: General and Regional   Post-op Pain Management: GA combined w/ Regional for post-op pain   Induction: Intravenous  Airway Management Planned: LMA  Additional Equipment:   Intra-op Plan:   Post-operative Plan: Extubation in OR  Informed Consent: I have reviewed the patients History and Physical, chart, labs and discussed the procedure including the risks, benefits and alternatives for the proposed anesthesia with the patient or authorized representative who has indicated his/her understanding and acceptance.   Dental advisory given  Plan Discussed with: CRNA and Surgeon  Anesthesia Plan Comments:         Anesthesia Quick Evaluation

## 2015-03-29 NOTE — Discharge Instructions (Addendum)
[facility] °Office Phone Number [phone] ° °BREAST BIOPSY/ PARTIAL MASTECTOMY: POST OP INSTRUCTIONS ° °Always review your discharge instruction sheet given to you by the facility where your surgery was performed. ° °IF YOU HAVE DISABILITY OR FAMILY LEAVE FORMS, YOU MUST BRING THEM TO THE OFFICE FOR PROCESSING.  DO NOT GIVE THEM TO YOUR DOCTOR. ° °1. A prescription for pain medication may be given to you upon discharge.  Take your pain medication as prescribed, if needed.  If narcotic pain medicine is not needed, then you may take acetaminophen (Tylenol) or ibuprofen (Advil) as needed. °2. Take your usually prescribed medications unless otherwise directed °3. If you need a refill on your pain medication, please contact your pharmacy.  They will contact our office to request authorization.  Prescriptions will not be filled after 5pm or on week-ends. °4. You should eat very light the first 24 hours after surgery, such as soup, crackers, pudding, etc.  Resume your normal diet the day after surgery. °5. Most patients will experience some swelling and bruising in the breast.  Ice packs and a good support bra will help.  Swelling and bruising can take several days to resolve.  °6. It is common to experience some constipation if taking pain medication after surgery.  Increasing fluid intake and taking a stool softener will usually help or prevent this problem from occurring.  A mild laxative (Milk of Magnesia or Miralax) should be taken according to package directions if there are no bowel movements after 48 hours. °7. Unless discharge instructions indicate otherwise, you may remove your bandages 48 hours after surgery, and you may shower at that time.  You may have steri-strips (small skin tapes) in place directly over the incision.  These strips should be left on the skin for 7-10 days.   Any sutures or staples will be removed at the office during your follow-up visit. °8. ACTIVITIES:  You may resume  regular daily activities (gradually increasing) beginning the next day.  Wearing a good support bra or sports bra (or the breast binder) minimizes pain and swelling.  You may have sexual intercourse when it is comfortable. °a. You may drive when you no longer are taking prescription pain medication, you can comfortably wear a seatbelt, and you can safely maneuver your car and apply brakes. °b. RETURN TO WORK:  __________1 week_______________ °9. You should see your doctor in the office for a follow-up appointment approximately two weeks after your surgery.  Your doctor’s nurse will typically make your follow-up appointment when she calls you with your pathology report.  Expect your pathology report 2-3 business days after your surgery.  You may call to check if you do not hear from us after three days. ° ° °WHEN TO CALL YOUR DOCTOR: °1. Fever over 101.0 °2. Nausea and/or vomiting. °3. Extreme swelling or bruising. °4. Continued bleeding from incision. °5. Increased pain, redness, or drainage from the incision. ° °The clinic staff is available to answer your questions during regular business hours.  Please don’t hesitate to call and ask to speak to one of the nurses for clinical concerns.  If you have a medical emergency, go to the nearest emergency room or call 911.  A surgeon from [facility] is always on call at the hospital. ° °For further questions, please visit [email or web address]  ° ° °Post Anesthesia Home Care Instructions ° °Activity: °Get plenty of rest for the remainder of the day. A responsible adult should stay with you for 24   hours following the procedure.  °For the next 24 hours, DO NOT: °-Drive a car °-Operate machinery °-Drink alcoholic beverages °-Take any medication unless instructed by your physician °-Make any legal decisions or sign important papers. ° °Meals: °Start with liquid foods such as gelatin or soup. Progress to regular foods as tolerated. Avoid greasy, spicy, heavy  foods. If nausea and/or vomiting occur, drink only clear liquids until the nausea and/or vomiting subsides. Call your physician if vomiting continues. ° °Special Instructions/Symptoms: °Your throat may feel dry or sore from the anesthesia or the breathing tube placed in your throat during surgery. If this causes discomfort, gargle with warm salt water. The discomfort should disappear within 24 hours. ° °If you had a scopolamine patch placed behind your ear for the management of post- operative nausea and/or vomiting: ° °1. The medication in the patch is effective for 72 hours, after which it should be removed.  Wrap patch in a tissue and discard in the trash. Wash hands thoroughly with soap and water. °2. You may remove the patch earlier than 72 hours if you experience unpleasant side effects which may include dry mouth, dizziness or visual disturbances. °3. Avoid touching the patch. Wash your hands with soap and water after contact with the patch. °  ° °

## 2015-03-29 NOTE — Progress Notes (Signed)
Radiology staff performed nuc med inj. Additional fentanyl given. Pt tol well. VSS. Family called to bedside. Emotional suppport provided

## 2015-03-29 NOTE — Anesthesia Postprocedure Evaluation (Signed)
Anesthesia Post Note  Patient: [name] [name]  Procedure(s) Performed: Procedure(s) (LRB): BREAST LUMPECTOMY WITH RADIOACTIVE SEED AND SENTINEL LYMPH NODE BIOPSY (Right)  Anesthesia type: General  Patient location: PACU  Post pain: Pain level controlled  Post assessment: Post-op Vital signs reviewed  Last Vitals: BP 154/90 mmHg  Pulse 98  Temp(Src) 36.9 C (Oral)  Resp 20  Ht '5\' 10"'$  (1.778 m)  Wt 210 lb (95.255 kg)  BMI 30.13 kg/m2  SpO2 100%  LMP 08/15/2014  Post vital signs: Reviewed  Level of consciousness: sedated  Complications: No apparent anesthesia complications

## 2015-03-29 NOTE — Interval H&P Note (Signed)
History and Physical Interval Note:  03/29/2015 12:52 PM  [name] [name]  has presented today for surgery, with the diagnosis of right breast cancer  The various methods of treatment have been discussed with the patient and family. After consideration of risks, benefits and other options for treatment, the patient has consented to  Procedure(s): BREAST LUMPECTOMY WITH RADIOACTIVE SEED AND SENTINEL LYMPH NODE BIOPSY (Right) as a surgical intervention .  The patient's history has been reviewed, patient examined, no change in status, stable for surgery.  I have reviewed the patient's chart and labs.  Questions were answered to the patient's satisfaction.     Keatyn Luck

## 2015-03-29 NOTE — H&P (View-Only) (Signed)
Breast Surgery for cancer  What is a lumpectomy and lymph node biopsy  A lumpectomy is a surgical procedure for removal of a cancerous lump from a woman's breast. Axillary lymph node surgery is removal of some or most of the  lymph nodes under the armpit. These procedures are treatment for breast cancer.   When is it used?   This procedure is a treatment for a cancerous lump in your breast.  Have a sentinel node biopsy.  A sentinel node biopsy is done by injecting a special dye around the cancer and then removing only the lymph nodes affected by the dye (usually 1 to 5  nodes). The dye finds the first lymph nodes to which cancer cells are likely to spread from a tumor. These sentinel nodes are examined for cancer. If cancer is found in them, then an axillary node dissection can be done later.  This is done the day of surgery.   Have the entire breast and lymph nodes removed (a mastectomy). Choose not to have treatment.   You should ask your health care provider about these choices.   How do I prepare for this procedure?   Plan for your care and recovery after the operation. Find someone to drive you home after the surgery and stay with you for a night or two. Allow for time to rest and try to find people to help you with your day-to-day duties.   Follow your health care provider's instructions about not smoking before and after the procedure. Smokers heal more slowly after surgery. They are also more likely to have breathing problems during surgery. For this reason, if you are a smoker, you should quit at least 2 weeks before the procedure. It is best to quit 6 to 8 weeks before surgery. Also, your wounds will heal much better if you do not smoke after the surgery.   You will likely need a radiologic marker placed prior to surgery.  This will be either a seed or a wire.  Seeds are placed 1-5 days prior to surgery and wires are placed the day of surgery.  There are numerous factors that go into  the decision which one you need.    If you need a minor pain reliever in the week before surgery, choose acetaminophen rather than aspirin, ibuprofen, or naproxen. This helps avoid extra bleeding during surgery. If you are taking daily aspirin for a medical condition, ask your provider if you need to stop taking it before your surgery.   You will also get a block the day of surgery that helps with post op pain control.    Follow any other instructions your provider gives you. Eat a light meal, such as soup or salad, the night before the procedure. Do not eat or drink anything after midnight or the morning before the procedure. Do not even drink coffee, tea, or water.   What happens during the procedure?   You will be given a general anesthetic. A general anesthetic relaxes your muscles, puts you to sleep, and prevents you from feeling pain.   The surgeon makes a small cut and removes the lump and surrounding breast tissue. If axillary node dissection is planned, the lymph nodes under your armpit are removed through another cut in the armpit.   If you and your surgeon have chosen sentinel lymph node biopsy instead of axillary node dissection, a special dye is injected around the nipple in the holding area. Then a smaller incision is made  in the armpit and the 1 to 5 nodes containing the dye are removed. You will be told a few days later whether cancer was found in the nodes. If cancer was found, you will need another procedure to remove the remaining lymph nodes (axillary lymph node dissection). Fortunately, most women can avoid full axillary node dissection with this method.   The cuts will be closed with stitches. If you have a full axillary node dissection, a drain will be left in the cut in your armpit for a few days.   What happens after the procedure?   You may go home that day or stay in the hospital overnight. Your health care provider may suggest you start treatment with radiation or  chemotherapy, depending on the results of lab tests. It is helpful to have a family member or friend with you on the first visit after surgery, when you discuss test results.   You will be given a prescription for pain medicine. A nonprescription anti-inflammatory medicine, such as ibuprofen, may give most of the pain relief that you need. Ask your health care provider how to prevent and treat pain.   Ask your provider what other steps you should take and when you should come back for a checkup. If you have an axillary lymph node dissection, your surgeon will give detailed instructions on how to care for the drain under your arm and when to come back for drain removal.   What are the benefits of this procedure?   You may have the cancer removed without losing your breast.   What are the risks associated with this procedure?   There are some risks when you have general anesthesia. Discuss these risks with your health care provider. You may have infection or bleeding. A lumpy scar, called a keloid, might form after the surgery. The underside of your arm will probably be numb after axillary lymph node dissection due to the loss of nerves to the skin. The extent of numbness varies from person to person. Depending on the size of the lump that was taken out and the size of your breast, the shape of your breast may change. Your nipple may point another way and your breasts may not match as well as before the surgery. Lab tests may show that the cancer was not completely removed. If this happens, you will need more surgery. The cancer may come back, but radiation therapy, hormone therapy, and chemotherapy can make this less likely.   You should ask your health care provider how these risks apply to you.   When should I call my health care provider?   Call your provider right away if:   You have a fever of 100F (37.8C) or higher. You have problems with the drain. You have bleeding or weeping  from the wound. You have a lot of pain. (You should not have much pain and it should get better, not worse.)   Call during office hours if:   You have questions about the procedure or its result. You want to make another appointment.

## 2015-03-29 NOTE — Progress Notes (Signed)
Assisted Dr. Germeroth with right, ultrasound guided, pectoralis block. Side rails up, monitors on throughout procedure. See vital signs in flow sheet. Tolerated Procedure well. 

## 2015-03-29 NOTE — Anesthesia Procedure Notes (Addendum)
Anesthesia Regional Block:  Pectoralis block  Pre-Anesthetic Checklist: ,, timeout performed, Correct Patient, Correct Site, Correct Laterality, Correct Procedure, Correct Position, site marked, Risks and benefits discussed,  Surgical consent,  Pre-op evaluation,  At surgeon's request and post-op pain management  Laterality: Right  Prep: chloraprep       Needles:   Needle Type: Stimiplex     Needle Length: 9cm 9 cm     Additional Needles:  Procedures: ultrasound guided (picture in chart) Pectoralis block Narrative:  Injection made incrementally with aspirations every 5 mL.  Performed by: Personally  Anesthesiologist: Nolon Nations  Additional Notes: Patient tolerated well. Good fascial spread noted.   Procedure Name: LMA Insertion Date/Time: 03/29/2015 2:31 PM Performed by: Melynda Ripple D Pre-anesthesia Checklist: Patient identified, Emergency Drugs available, Suction available and Patient being monitored Patient Re-evaluated:Patient Re-evaluated prior to inductionOxygen Delivery Method: Circle System Utilized Preoxygenation: Pre-oxygenation with 100% oxygen Intubation Type: IV induction Ventilation: Mask ventilation without difficulty LMA: LMA inserted LMA Size: 4.0 Number of attempts: 1 Airway Equipment and Method: Bite block Placement Confirmation: positive ETCO2 Tube secured with: Tape Dental Injury: Teeth and Oropharynx as per pre-operative assessment

## 2015-03-29 NOTE — Transfer of Care (Signed)
Immediate Anesthesia Transfer of Care Note  Patient: [name] [name]  Procedure(s) Performed: Procedure(s): BREAST LUMPECTOMY WITH RADIOACTIVE SEED AND SENTINEL LYMPH NODE BIOPSY (Right)  Patient Location: PACU  Anesthesia Type:General and Regional  Level of Consciousness: awake, alert  and oriented  Airway & Oxygen Therapy: Patient Spontanous Breathing and Patient connected to face mask oxygen  Post-op Assessment: Report given to RN and Post -op Vital signs reviewed and stable  Post vital signs: Reviewed and stable  Last Vitals:  Filed Vitals:   03/29/15 1617  BP:   Pulse:   Temp: 36.8 C  Resp:     Complications: No apparent anesthesia complications

## 2015-03-30 ENCOUNTER — Encounter (HOSPITAL_BASED_OUTPATIENT_CLINIC_OR_DEPARTMENT_OTHER): Payer: Self-pay | Admitting: General Surgery

## 2015-04-01 ENCOUNTER — Telehealth: Payer: Self-pay | Admitting: General Surgery

## 2015-04-01 NOTE — Telephone Encounter (Signed)
Reviewed pathology with patient.  Only 1 mm tumor left, LN negative.  Discussed with Dr. Burr Medico.  Sent pathology to Dr. Valere Dross.

## 2015-04-07 ENCOUNTER — Other Ambulatory Visit: Payer: Self-pay | Admitting: Hematology

## 2015-04-07 ENCOUNTER — Telehealth: Payer: Self-pay | Admitting: *Deleted

## 2015-04-07 ENCOUNTER — Other Ambulatory Visit: Payer: Self-pay | Admitting: *Deleted

## 2015-04-07 DIAGNOSIS — C7800 Secondary malignant neoplasm of unspecified lung: Principal | ICD-10-CM

## 2015-04-07 DIAGNOSIS — C50911 Malignant neoplasm of unspecified site of right female breast: Secondary | ICD-10-CM

## 2015-04-07 NOTE — Telephone Encounter (Signed)
I have referred her to rad/onc Dr. Pablo Ledger. I called pt and informed her about the referral.   [name] [name]  04/07/2015

## 2015-04-07 NOTE — Telephone Encounter (Signed)
Pt called and left message wanting to know about radiation referral that Dr. Burr Medico had discussed with pt at her last office visit.  Pt still has not heard about appt with radiation dept yet. Pt has office visit with Dr. Burr Medico and Dr. Barry Dienes on  04/12/15. Pt's   Phone     [phone].

## 2015-04-12 ENCOUNTER — Encounter: Payer: Self-pay | Admitting: Hematology

## 2015-04-12 ENCOUNTER — Telehealth: Payer: Self-pay | Admitting: Hematology

## 2015-04-12 ENCOUNTER — Ambulatory Visit: Payer: BLUE CROSS/BLUE SHIELD | Admitting: Hematology

## 2015-04-12 ENCOUNTER — Other Ambulatory Visit (HOSPITAL_BASED_OUTPATIENT_CLINIC_OR_DEPARTMENT_OTHER): Payer: BLUE CROSS/BLUE SHIELD

## 2015-04-12 ENCOUNTER — Ambulatory Visit (HOSPITAL_BASED_OUTPATIENT_CLINIC_OR_DEPARTMENT_OTHER): Payer: BLUE CROSS/BLUE SHIELD | Admitting: Hematology

## 2015-04-12 ENCOUNTER — Ambulatory Visit: Payer: BLUE CROSS/BLUE SHIELD

## 2015-04-12 ENCOUNTER — Other Ambulatory Visit: Payer: BLUE CROSS/BLUE SHIELD

## 2015-04-12 ENCOUNTER — Ambulatory Visit (HOSPITAL_BASED_OUTPATIENT_CLINIC_OR_DEPARTMENT_OTHER): Payer: BLUE CROSS/BLUE SHIELD

## 2015-04-12 VITALS — BP 131/71 | HR 85 | Temp 98.5°F | Resp 18 | Ht 70.0 in | Wt 209.1 lb

## 2015-04-12 DIAGNOSIS — C7801 Secondary malignant neoplasm of right lung: Secondary | ICD-10-CM

## 2015-04-12 DIAGNOSIS — C7802 Secondary malignant neoplasm of left lung: Secondary | ICD-10-CM | POA: Diagnosis not present

## 2015-04-12 DIAGNOSIS — G629 Polyneuropathy, unspecified: Secondary | ICD-10-CM

## 2015-04-12 DIAGNOSIS — C50311 Malignant neoplasm of lower-inner quadrant of right female breast: Secondary | ICD-10-CM

## 2015-04-12 DIAGNOSIS — Z5112 Encounter for antineoplastic immunotherapy: Secondary | ICD-10-CM | POA: Diagnosis not present

## 2015-04-12 DIAGNOSIS — D6481 Anemia due to antineoplastic chemotherapy: Secondary | ICD-10-CM

## 2015-04-12 DIAGNOSIS — C7800 Secondary malignant neoplasm of unspecified lung: Principal | ICD-10-CM

## 2015-04-12 DIAGNOSIS — C50911 Malignant neoplasm of unspecified site of right female breast: Secondary | ICD-10-CM

## 2015-04-12 DIAGNOSIS — Z95828 Presence of other vascular implants and grafts: Secondary | ICD-10-CM

## 2015-04-12 DIAGNOSIS — Z171 Estrogen receptor negative status [ER-]: Secondary | ICD-10-CM

## 2015-04-12 LAB — COMPREHENSIVE METABOLIC PANEL (CC13)
ALT: 24 U/L (ref 0–55)
AST: 25 U/L (ref 5–34)
Albumin: 3.5 g/dL (ref 3.5–5.0)
Alkaline Phosphatase: 68 U/L (ref 40–150)
Anion Gap: 9 mEq/L (ref 3–11)
BILIRUBIN TOTAL: 0.4 mg/dL (ref 0.20–1.20)
BUN: 12.9 mg/dL (ref 7.0–26.0)
CHLORIDE: 108 meq/L (ref 98–109)
CO2: 24 meq/L (ref 22–29)
Calcium: 9.6 mg/dL (ref 8.4–10.4)
Creatinine: 0.8 mg/dL (ref 0.6–1.1)
GLUCOSE: 106 mg/dL (ref 70–140)
POTASSIUM: 3.8 meq/L (ref 3.5–5.1)
SODIUM: 141 meq/L (ref 136–145)
TOTAL PROTEIN: 7.2 g/dL (ref 6.4–8.3)

## 2015-04-12 LAB — CBC WITH DIFFERENTIAL/PLATELET
BASO%: 0.3 % (ref 0.0–2.0)
Basophils Absolute: 0 10*3/uL (ref 0.0–0.1)
EOS ABS: 0.5 10*3/uL (ref 0.0–0.5)
EOS%: 5.8 % (ref 0.0–7.0)
HCT: 35.8 % (ref 34.8–46.6)
HGB: 11.7 g/dL (ref 11.6–15.9)
LYMPH%: 26.6 % (ref 14.0–49.7)
MCH: 28.9 pg (ref 25.1–34.0)
MCHC: 32.7 g/dL (ref 31.5–36.0)
MCV: 88.4 fL (ref 79.5–101.0)
MONO#: 0.6 10*3/uL (ref 0.1–0.9)
MONO%: 7 % (ref 0.0–14.0)
NEUT%: 60.3 % (ref 38.4–76.8)
NEUTROS ABS: 5.4 10*3/uL (ref 1.5–6.5)
Platelets: 269 10*3/uL (ref 145–400)
RBC: 4.05 10*6/uL (ref 3.70–5.45)
RDW: 13.6 % (ref 11.2–14.5)
WBC: 8.9 10*3/uL (ref 3.9–10.3)
lymph#: 2.4 10*3/uL (ref 0.9–3.3)

## 2015-04-12 MED ORDER — LIDOCAINE-PRILOCAINE 2.5-2.5 % EX CREA: TOPICAL_CREAM | CUTANEOUS | Status: DC | PRN

## 2015-04-12 MED ORDER — DIPHENHYDRAMINE HCL 25 MG PO CAPS
ORAL_CAPSULE | ORAL | Status: AC
  Filled 2015-04-12: qty 2

## 2015-04-12 MED ORDER — HEPARIN SOD (PORK) LOCK FLUSH 100 UNIT/ML IV SOLN
500.0000 [IU] | Freq: Once | INTRAVENOUS | Status: AC | PRN
  Administered 2015-04-12: 500 [IU]
  Filled 2015-04-12: qty 5

## 2015-04-12 MED ORDER — ACETAMINOPHEN 325 MG PO TABS
ORAL_TABLET | ORAL | Status: AC
  Filled 2015-04-12: qty 2

## 2015-04-12 MED ORDER — SODIUM CHLORIDE 0.9 % IJ SOLN
10.0000 mL | INTRAMUSCULAR | Status: DC | PRN
  Administered 2015-04-12: 10 mL
  Filled 2015-04-12: qty 10

## 2015-04-12 MED ORDER — DIPHENHYDRAMINE HCL 25 MG PO CAPS
50.0000 mg | ORAL_CAPSULE | Freq: Once | ORAL | Status: AC
  Administered 2015-04-12: 50 mg via ORAL

## 2015-04-12 MED ORDER — ACETAMINOPHEN 325 MG PO TABS
650.0000 mg | ORAL_TABLET | Freq: Once | ORAL | Status: AC
  Administered 2015-04-12: 650 mg via ORAL

## 2015-04-12 MED ORDER — TRASTUZUMAB CHEMO INJECTION 440 MG
588.0000 mg | Freq: Once | INTRAVENOUS | Status: AC
  Administered 2015-04-12: 588 mg via INTRAVENOUS
  Filled 2015-04-12: qty 28

## 2015-04-12 MED ORDER — SODIUM CHLORIDE 0.9 % IJ SOLN
10.0000 mL | INTRAMUSCULAR | Status: DC | PRN
  Administered 2015-04-12: 10 mL via INTRAVENOUS
  Filled 2015-04-12: qty 10

## 2015-04-12 MED ORDER — SODIUM CHLORIDE 0.9 % IV SOLN
Freq: Once | INTRAVENOUS | Status: AC
  Administered 2015-04-12: 11:00:00 via INTRAVENOUS

## 2015-04-12 NOTE — Patient Instructions (Signed)

## 2015-04-12 NOTE — Progress Notes (Signed)
[place]  Follow up note   Patient Care Team: [name], DO as PCP - General (Family Medicine) [name], DO (Family Medicine) Holley Bouche, NP as Nurse Practitioner (Nurse Practitioner) Stark Klein, MD as Consulting Physician (General Surgery) Arloa Koh, MD as Consulting Physician (Radiation Oncology) [name], MD as Consulting Physician (Hematology)  CHIEF COMPLAINTS Follow up metastatic breast cancer  Oncology History   Breast cancer 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   Staging form: Breast, AJCC 7th Edition     Clinical stage from 07/22/2014: Stage IV (T3, N1, M1) - Unsigned       Breast cancer 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   07/02/2014 Mammogram Mammogram showed a 2cm right beast mass and a 1.8cm right axillary node. MRI breast on 07/16/2014 showed 7cm R breast lesion and 4.4cm r axillary node    07/02/2014 Imaging CT CAP: a 4.7cm mass in LUL lung and a 2.1cm mas in RML, and a small nodule in RUL, suspecious for metastasis     07/09/2014 Initial Diagnosis right IDA with b/l lung lesions, ER-/PR-/HER2+   07/09/2014 Initial Biopsy US guided right breast mass and axillary node biopsy showed IDA, and DCIS, ER-/PR-/HER2+   07/26/2014 Pathologic Stage Left lung mass by IR, path revealed high grade carcinoma, morphology similar to breast tumor biopsy, TTF(-), NapsinA(-), ER(-)   08/04/2014 - 03/22/2015 Chemotherapy weekly Paclitaxel 60m/m2, trastuzumab and pertuzumab every 3 weeks   10/04/2014 Imaging Interval decrease in the right axillary lymphadenopathy. Bilateral pulmonary lesions with left hilar lymphadenopathy also markedly decreased in the interval. The left hilar lymphadenopathy has resolved.   12/20/2014 Imaging restaging CT showed stable disease, no new lesions    04/12/2015 -  Chemotherapy  Herceptin maintenance therapy , 6 mg/kg, every 3 weeks    CURRENT THERAPY: weekly Paclitaxel 840mm2 (changed to 70 mg/m from cycle 16 due to abdominal cramps, 60 mg/m since  cycles 22nd due to neuropathy), trastuzumab and pertuzumab every 3 weeks, started on 08/04/2014  INTERVAL HISTROY   NiAllineeturns for follow-up.  She underwent right breast lumpectomy and sentinel lymph node by Dr. ByBarry Dienesn 03/29/2015. She tolerated surgery very well,  Has mild intermittent pain at the incision site, took pain killer a few times,  And she is recovering very well.  She very excited about the surgical findings, which showed near complete response, with only 1 mm residual tumor and all 7 nodes were negative.    MEDICAL HISTORY:  Past Medical History  Diagnosis Date  . Breast cancer   . Pneumonia     hx of pneumonia 08/2013   . GERD (gastroesophageal reflux disease)     during pregnancy   . Breast cancer     SURGICAL HISTORY: Past Surgical History  Procedure Laterality Date  . Cesarean section    . Essure tubal ligation    . Cholecystectomy    . Wisdom tooth extraction    . Portacath placement Left 07/21/2014    Procedure: INSERTION PORT-A-CATH;  Surgeon: FaStark KleinMD;  Location: [facility];  Service: General;  Laterality: Left;  . Breast lumpectomy with radioactive seed and sentinel lymph node biopsy Right 03/29/2015    Procedure: BREAST LUMPECTOMY WITH RADIOACTIVE SEED AND SENTINEL LYMPH NODE BIOPSY;  Surgeon: FaStark KleinMD;  Location: [facility] Service: General;  Laterality: Right;    SOCIAL HISTORY: History   Social History  . Marital Status: Married    Spouse Name: N/A    Number of Children:  she has  2 daughters at the age of 20 and 47.   . Years of Education: N/A   Occupational History  .  works as Freight forwarder for a [employer].    Social History Main Topics  . Smoking status: Never Smoker   . Smokeless tobacco: Not on file  . Alcohol Use: Yes  . Drug Use: No  . Sexual Activity: No    FAMILY HISTORY: Family History  Problem Relation Age of Onset  . Breast cancer Mother 108  . Liver cancer Father 35  . Breast cancer  Maternal Aunt 36  . Prostate cancer Maternal Grandfather   . Liver cancer Paternal Grandmother   . Prostate cancer Paternal Grandfather      GENETICS: 08/30/2014 BreastNext panel was negative. 17 genes including BRCA1, BRCA2, were negative for mutations.   ALLERGIES:  is allergic to adhesive; aspirin; and codeine.  MEDICATIONS:  Current Outpatient Prescriptions  Medication Sig Dispense Refill  . Camphor-Eucalyptus-Menthol (VICKS VAPORUB EX) Apply 1 application topically at bedtime. Applies under nose, throat and on chest.    . cetirizine (ZYRTEC) 10 MG tablet Take 10 mg by mouth daily as needed for allergies (allergies).     . clindamycin (CLINDAGEL) 1 % gel Apply topically to affected areas BID. 60 g 3  . hydrocortisone 2.5 % cream APPLY TOPICALLY TO THE AFFECTED AREA TWICE DAILY 454 g 0  . lidocaine-prilocaine (EMLA) cream Apply topically as needed. 30 g 2  . loperamide (IMODIUM A-D) 2 MG tablet Take 1 tablet (2 mg total) by mouth 4 (four) times daily as needed for diarrhea or loose stools. 30 tablet 0  . LORazepam (ATIVAN) 1 MG tablet Take 1 tablet (1 mg total) by mouth once. (Patient taking differently: Take 1 mg by mouth as needed. ) 2 tablet 0  . ondansetron (ZOFRAN) 8 MG tablet Take 1 tablet (8 mg total) by mouth every 8 (eight) hours as needed for nausea or vomiting. 30 tablet 3  . oxyCODONE-acetaminophen (ROXICET) 5-325 MG per tablet Take 1-2 tablets by mouth every 4 (four) hours as needed for severe pain. 30 tablet 0  . pantoprazole (PROTONIX) 20 MG tablet Take 1 tablet (20 mg total) by mouth daily. (Patient taking differently: Take 20 mg by mouth as needed. ) 30 tablet 3  . Pseudoeph-Doxylamine-DM-APAP (NYQUIL PO) Take 30 mLs by mouth 2 (two) times daily as needed (cold/allergies).     . zolpidem (AMBIEN) 5 MG tablet Take 1 tablet (5 mg total) by mouth at bedtime as needed for sleep. 20 tablet 0   No current facility-administered medications for this visit.    Facility-Administered Medications Ordered in Other Visits  Medication Dose Route Frequency Provider Last Rate Last Dose  . heparin lock flush 100 unit/mL  500 Units Intracatheter Once PRN [name], MD      . sodium chloride 0.9 % injection 10 mL  10 mL Intracatheter PRN [name], MD      . trastuzumab (HERCEPTIN) 588 mg in sodium chloride 0.9 % 250 mL chemo infusion  588 mg Intravenous Once [name], MD 556 mL/hr at 04/12/15 1118 588 mg at 04/12/15 1118    REVIEW OF SYSTEMS:   Constitutional: Denies fevers, chills or abnormal night sweats, (+) malaise  Eyes: Denies blurriness of vision, double vision or watery eyes Ears, nose, mouth, throat, and face: Denies mucositis or sore throat Respiratory: (+) productive cough, no dyspnea or wheezes Cardiovascular: Denies palpitation, chest discomfort or lower extremity swelling Gastrointestinal:  Denies nausea, heartburn  or change in bowel habits Skin:(+) rashes  Lymphatics: Denies new lymphadenopathy or easy bruising Neurological:Denies numbness, tingling or new weaknesses Behavioral/Psych: Mood is stable, no new changes  All other systems were reviewed with the patient and are negative.  PHYSICAL EXAMINATION: ECOG PERFORMANCE STATUS: 1 BP 131/71 mmHg  Pulse 85  Temp(Src) 98.5 F (36.9 C) (Oral)  Resp 18  Ht 5' 10"  (1.778 m)  Wt 209 lb 1.6 oz (94.847 kg)  BMI 30.00 kg/m2  SpO2 100%  LMP 08/15/2014 GENERAL:alert, no distress and comfortable SKIN: skin color, texture, turgor are normal, no rashes or significant lesions EYES: normal, conjunctiva are pink and non-injected, sclera clear OROPHARYNX:no exudate, no erythema and lips, buccal mucosa, and tongue normal  NECK: supple, thyroid normal size, non-tender, without nodularity LYMPH:  no palpable lymphadenopathy in the cervical, axillary or inguinal LUNGS: clear to auscultation and percussion with normal breathing effort HEART: regular rate & rhythm and no murmurs and no lower  extremity edema ABDOMEN:abdomen soft, non-tender and normal bowel sounds Musculoskeletal:no cyanosis of digits and no clubbing  PSYCH: alert & oriented x 3 with fluent speech NEURO: no focal motor/sensory deficits Breasts:  Status post right breast the lumpectomy and sentinel lymph node biopsy, incisions are healing very well. No surrounding skin erythema or discharge.Breast inspection showed them to be symmetrical with no skin change or nipple discharge. Palpation of bilateral breasts  and axilla revealed no obvious mass that I could appreciate. Skin: (+) Skin pigmentation on hands and feet, lighter than before.  a few acne-like rash on face and neck. No new skin rashes. (+)  pigmentation on fingernails, and bulging of fingernails and thickening of soft tissue under nailbed.   LABORATORY DATA:  I have reviewed the data as listed CBC Latest Ref Rng 04/12/2015 03/22/2015 03/15/2015  WBC 3.9 - 10.3 10e3/uL 8.9 7.0 6.3  Hemoglobin 11.6 - 15.9 g/dL 11.7 11.1(L) 10.8(L)  Hematocrit 34.8 - 46.6 % 35.8 33.6(L) 32.3(L)  Platelets 145 - 400 10e3/uL 269 314 273    CMP Latest Ref Rng 04/12/2015 03/22/2015 03/15/2015  Glucose 70 - 140 mg/dl 106 109 104  BUN 7.0 - 26.0 mg/dL 12.9 10.7 14.4  Creatinine 0.6 - 1.1 mg/dL 0.8 0.8 0.8  Sodium 136 - 145 mEq/L 141 143 142  Potassium 3.5 - 5.1 mEq/L 3.8 3.7 3.5  Chloride 96 - 112 mmol/L - - -  CO2 22 - 29 mEq/L 24 31(H) 28  Calcium 8.4 - 10.4 mg/dL 9.6 9.3 9.1  Total Protein 6.4 - 8.3 g/dL 7.2 6.9 6.6  Total Bilirubin 0.20 - 1.20 mg/dL 0.40 0.38 0.40  Alkaline Phos 40 - 150 U/L 68 64 60  AST 5 - 34 U/L 25 18 19   ALT 0 - 55 U/L 24 21 19      PATHOLOGY REPORT 07/09/2014 #1 breast, right needle core biopsy more anterior -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 45)  Lung, biopsy, Left 07/26/2014 - HIGH GRADE CARCINOMA, SEE COMMENT. Microscopic Comment The carcinoma demonstrates the following immunophenotype: TTF-1 - negative expression. Napsin A- negative expression. CK5/6 - focal, moderate expression. estrogen receptor - negative expression. GCDFP- negative expression. The recent breast biopsy demonstrating Her2 amplified high grade invasive mammary carcinoma is noted ([id number]). Although the immunophenotype of the current case is non-sepcific, it strongly argues against primary lung adenocarcinoma.  However, on re-review, the morphology of the current case is essentially identical to the primary mammary carcinoma. In lieu of further immunophenotyping, the tumor will be submitted for Her2 testing and the remaining tissue will be reserved for additional ancillary tumor testing. The results of the Her2 testing will be reported in an addendum. The case was discussed with Dr Burr Medico on 07/28/2015 and 07/29/2015 HER2 POSITIVE   Diagnosis 03/29/2015 1. Breast, lumpectomy, Right - INVASIVE DUCTAL CARCINOMA, SEE COMMENT. - SEE TUMOR SYNOPTIC TEMPLATE BELOW. 2. Lymph node, sentinel, biopsy, Right axillary #1 - ONE LYMPH NODE, NEGATIVE FOR TUMOR (0/1). - SEE COMMENT. 3. Lymph node, sentinel, biopsy, Right axillary #2 - BENIGN FIBROFATTY SOFT TISSUE. - NEGATIVE FOR ATYPIA OR MALIGNANCY. - NEGATIVE FOR LYMPH NODE. 4. Lymph node, sentinel, biopsy, Right axillary #3 - ONE LYMPH NODE, NEGATIVE FOR TUMOR (0/1). - SEE COMMENT. 5. Lymph node, sentinel, biopsy, Right axillary #4 - ONE LYMPH NODE, NEGATIVE FOR TUMOR (0/1). - SEE COMMENT. 6. Lymph node, sentinel, biopsy, Right axillary #5 - ONE LYMPH NODE, NEGATIVE FOR TUMOR (0/1). - SEE COMMENT. 7. Lymph node, sentinel, biopsy, right axillary - ONE LYMPH NODE, NEGATIVE FOR TUMOR (0/1). - SEE COMMENT. 8. Lymph node, sentinel, biopsy, right axillary - ONE LYMPH NODE, NEGATIVE FOR  TUMOR (0/1). - SEE COMMENT. 1 of 4 Amended copy Amended FINAL for BRIDGIT, EYNON ([id number]) Microscopic Comment 1. BREAST, INVASIVE TUMOR, WITH LYMPH NODES PRESENT Specimen, including laterality and lymph node sampling (sentinel, non-sentinel): Right breast with sentinel and non-sentinel lymph node sampling Procedure: Lumpectomy Histologic type: Ductal, see comment Grade: 2 of 3, see comment. Tubule formation: 3 Nuclear pleomorphism: 3 Mitotic: 1 Tumor size (glass slide measurement): 1 mm, see comment Margins: Invasive, distance to closest margin: See comment In-situ, distance to closest margin: N/A If margin positive, focally or broadly: N/A Lymphovascular invasion: Absent Ductal carcinoma in situ: Absent Grade: N/A Extensive intraductal component: N/A Lobular neoplasia: Absent Tumor focality: Unifocal, see comment Treatment effect: Present If present, treatment effect in breast tissue, lymph nodes or both: Both breast tissue and lymph nodes Extent of tumor: Skin: N/A Nipple: N/A Skeletal muscle: N/A Lymph nodes: Examined: 5 Sentinel 2 Non-sentinel 7 Total Lymph nodes with metastasis: 0 Isolated tumor cells (< 0.2 mm): N/A Micrometastasis: (> 0.2 mm and < 2.0 mm): N/A Macrometastasis: (> 2.0 mm): N/A Extracapsular extension: N/A Breast prognostic profile: Estrogen receptor: Not repeated, previous study demonstrated 0% positivity ([id number]). Progesterone receptor: Not repeated, previous study demonstrated 0% positivity ([id number]) Her 2 neu: Demonstrates amplificaition ([id number]) Ki-67: Not repeated, previous study demonstrate 79% proliferation rate ([id number]). Non-neoplastic breast: Neoadjuvant related tissue changes and adenosis. TNM: ypT67m, ypN0, pMX Comments: Numerous representative sections including the 2.0 cm hemorrhagic tissue associated with X-shaped biopsy clip, the 2.0 cm ill defined lesion associated with M-shaped clip, and intervening  tissue were submitted for review. Slide sections from the tissue surrounding the X-shaped and M-shaped clips demonstrate tissue changes consistent with neoadjuvant related change. There is no invasive or in situ carcinom present at either site. However, in a representative section from the intervening tissue (slide G), there is a 1 mm focus of high grade invasive ductal carcinoma present. Given the minute size of the tumor, tumor grading is limited. The tumor is present within non-marginal tissue sections and is considered to be at least 0.5 cm from the nearest margin (medial).   Note: Amendment issued for modification of synoptic table, inadvertent typographical error. The change in the synoptic table was discussed with Dr  Kassius Battiste on 04/07/2015. (CRR:ecj 04/07/2015) 2. , 4-8. In parts 2 and 4, there is extensive neoadjuvant related tissue changes including abundant foamy macrophages, fibroinflammatory reaction and dystrophic calcifications. The neoadjuvant tissue changes extend into perinodal soft tissue and are either focally or broadly present at the cauterized edge of the tissue submitted. There are no definitive features of malignancy present.  RADIOGRAPHIC STUDIES: CT CAP 10/04/2014 IMPRESSION: Interval decrease in the right axillary lymphadenopathy.  Bilateral pulmonary lesions with left hilar lymphadenopathy also markedly decreased in the interval. The left hilar lymphadenopathy has resolved.  3 millimeter nonobstructing right renal stone.   CT chest, abdomen and pelvis with contrast 12/20/2014 IMPRESSION: 1. Today's study is very similar to the prior examination from 10/04/2014, demonstrating stable size of metastatic lesions in the right middle lobe and left upper lobe, and no new evidence of metastatic disease in the thorax. 2. 2.0 x 0.9 cm lesion in the medial aspect of the right breast is similar to prior examination. No residual right axillary lymphadenopathy. 3. As with  prior examinations, there is no evidence of metastatic disease to the abdomen or pelvis. 4. 3 mm nonobstructive calculus in the lower pole collecting system of the right kidney.  PET scan 02/21/2015  IMPRESSION: 1. No evidence of metabolically active breast cancer metastasis. 2. Stable parenchymal thickening in the upper lobes at site of prior pulmonary masses. These stable pulmonary parenchymal thicken does not have associated metabolic activity.   ECHO 02/10/2015  Study Conclusions  - Left ventricle: LV longitudinal strain was -18.3% The cavity size was normal. Systolic function was normal. The estimated ejection fraction was in the range of 55% to 60%. Wall motion was normal; there were no regional wall motion abnormalities. - Mitral valve: There was mild regurgitation. - Right ventricle: The cavity size was mildly dilated. Wall thickness was normal. - Tricuspid valve: There was mild regurgitation. - Pulmonary arteries: PA peak pressure: 33 mm Hg (S).  ASSESSMENT & PLAN:  44 year old African-American female, premenopausal, without significant past medical history, presented with palpable right breast mass and right axillary mass.   1. 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 - I reviewed her breasts surgical pathology results with her in great detail ,  She has had fantastic partial response (near complete response) form 8 months of chemotherapy and antibody therapy.   The initial 6.3 cm breast mass has only 1 mm residual tumor,  And the previously 4.4 cm right axillary lymph node has had complete  Pathological response, all 7 nodes were negative for cancer. - given the excellent response to her chemotherapy,  And her 3 lung metastatic lesions were not hypermetabolic on the  recent PET scan, I'll refer her to see radiation oncologist Dr. Valere Dross, who met her before, to consider consolidation radiation to the breast and possible lung lesions.  - I'll change her systemic therapy to Herceptin maintenance from now on , every 3 weeks, indefinitely, as long as no disease progression. - I'll follow-up her PET scan every 3-4 months.  2. Skin rashes on face and scalp, likely related to antibody therapy  -The rash on upper chest much improved with topical steroids, near resolved now  - continue topical steroids and clindamycin gel as needed   3. Nail changes -secondary to Taxol. -recovering well    4. G2  peripheral neuropathy, skin pigmentation, hands and feet -improving. We'll continue to monitor closely.  5. Anemia, secondary to chemotherapy -Stable. No need for transfusion.  Plan - she is scheduled to see Dr. Valere Dross later this week to consider consolidation radiation - start maintenance Herceptin every 3 weeks today -RTC in 3 weeks  I spent about 30 minutes counseling the patient and her family members, total care was about 40 minutes.  [name]  04/12/2015

## 2015-04-12 NOTE — Telephone Encounter (Signed)
per pof to schpt appt-sent MW email to sch pt appt-will call pt after reply

## 2015-04-13 ENCOUNTER — Telehealth: Payer: Self-pay | Admitting: Hematology

## 2015-04-13 ENCOUNTER — Telehealth: Payer: Self-pay | Admitting: *Deleted

## 2015-04-13 NOTE — Telephone Encounter (Signed)
mailed pt copy of completed avs

## 2015-04-13 NOTE — Progress Notes (Signed)
Location of Breast Cancer: Right Breast  Histology per Pathology Report:  03/29/15 Diagnosis 1. Breast, lumpectomy, Right - INVASIVE DUCTAL CARCINOMA, SEE COMMENT. - SEE TUMOR SYNOPTIC TEMPLATE BELOW. 2. Lymph node, sentinel, biopsy, Right axillary #1 - ONE LYMPH NODE, NEGATIVE FOR TUMOR (0/1). - SEE COMMENT. 3. Lymph node, sentinel, biopsy, Right axillary #2 - BENIGN FIBROFATTY SOFT TISSUE. - NEGATIVE FOR ATYPIA OR MALIGNANCY. - NEGATIVE FOR LYMPH NODE. 4. Lymph node, sentinel, biopsy, Right axillary #3 - ONE LYMPH NODE, NEGATIVE FOR TUMOR (0/1). - SEE COMMENT. 5. Lymph node, sentinel, biopsy, Right axillary #4 - ONE LYMPH NODE, NEGATIVE FOR TUMOR (0/1). - SEE COMMENT. 6. Lymph node, sentinel, biopsy, Right axillary #5 - ONE LYMPH NODE, NEGATIVE FOR TUMOR (0/1). - SEE COMMENT. 7. Lymph node, sentinel, biopsy, right axillary - ONE LYMPH NODE, NEGATIVE FOR TUMOR (0/1). - SEE COMMENT. 8. Lymph node, sentinel, biopsy, right axillary - ONE LYMPH NODE, NEGATIVE FOR TUMOR (0/1). - SEE COMMENT.  Receptor Status: ER(0%), PR (0%), Her2-neu (amplification), Ki-67(79%)  [name] [name] presented on 06/25/19  Mammogram showed a 2cm right beast mass and a 1.8cm right axillary node. MRI breast on 07/16/2014 showed 7cm R breast lesion and 4.4cm r axillary node   CT CAP: a 4.7cm mass in LUL lung and a 2.1cm mas in RML, and a small nodule in RUL, suspcious for metastasis   Past/Anticipated interventions by surgeon, if [id number] right Breast with Axillary Dissection  Past/Anticipated interventions by medical oncology, if any: Chemotherapy weekly Paclitaxel 26m/m2, trastuzumab and 2 to map every 3 weeks, started on 08/04/2014 - 03/22/2015.  04/12/15- Herceptin maintenance therapy , 6 mg/kg, every 3 weeks  Lymphedema issues, if any: None  Pain issues, if any: Tenderness of right axilla and intermittent pain in right lateral breast region  SAFETY ISSUES:  Prior radiation?  No  Pacemaker/ICD? No  Possible current pregnancy?No  Is the patient on methotrexate? 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

## 2015-04-13 NOTE — Telephone Encounter (Signed)
Per staff message and POF I have scheduled appts. Advised scheduler of appts. JMW  

## 2015-04-14 ENCOUNTER — Ambulatory Visit
Admission: RE | Admit: 2015-04-14 | Discharge: 2015-04-14 | Disposition: A | Discharge status: Home / Self Care | Payer: BC Managed Care – PPO | Source: Ambulatory Visit | Attending: Radiation Oncology | Admitting: Radiation Oncology

## 2015-04-14 ENCOUNTER — Encounter: Payer: Self-pay | Admitting: Radiation Oncology

## 2015-04-14 VITALS — BP 124/76 | HR 85 | Temp 98.2°F | Ht 70.0 in | Wt 205.9 lb

## 2015-04-14 DIAGNOSIS — C50911 Malignant neoplasm of unspecified site of right female breast: Secondary | ICD-10-CM | POA: Diagnosis present

## 2015-04-14 DIAGNOSIS — C7800 Secondary malignant neoplasm of unspecified lung: Secondary | ICD-10-CM

## 2015-04-14 DIAGNOSIS — Z510 Encounter for antineoplastic radiation therapy: Secondary | ICD-10-CM | POA: Insufficient documentation

## 2015-04-14 DIAGNOSIS — R590 Localized enlarged lymph nodes: Secondary | ICD-10-CM | POA: Insufficient documentation

## 2015-04-14 DIAGNOSIS — R918 Other nonspecific abnormal finding of lung field: Secondary | ICD-10-CM | POA: Insufficient documentation

## 2015-04-14 DIAGNOSIS — C50919 Malignant neoplasm of unspecified site of unspecified female breast: Secondary | ICD-10-CM | POA: Diagnosis present

## 2015-04-14 NOTE — Addendum Note (Signed)
Encounter addended by: Arloa Koh, MD on: 04/14/2015  4:54 PM<BR>     Documentation filed: Notes Section

## 2015-04-14 NOTE — Progress Notes (Addendum)
CC: Dr. Stark Klein, Dr. 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 a pleasant 44year old female who I first saw at the multidisciplinary clinic on 07/14/2014.  In March 2015 she noted posterior right shoulder discomfort.  2 months later she developed a right axillary mass and then soon afterwards, a right breast mass.  There was a rounded density along the right lung concerning for malignancy.  [facility] which showed a mass at 4:00 with a 1.8 cm nodes seen within the right axillary tail.  Ultrasound showed a 5 cm mass at the 4:00 position within the right breast and a 5 cm nodal mass within the right axillary tail.  Ultrasound-guided biopsies on 07/05/2014 were diagnostic for invasive ductal carcinoma/DCIS from the right breast mass and also from the right axillary node/mass.  This was ER/PR negative and HER-2/neu positive.  Ki-67 was 80%.  Her staging workup included a CT scan of the chest, abdomen, and pelvis on 07/20/2014.  She was found have bilateral pulmonary metastases.  She also had left hilar lymphadenopathy.  The left upper lobe mass measured 4.7 x 4.2 x 4.2 cm and within the right middle lobe was a 2.1 x 2.1 x 1.5 cm mass.  0.6 and 0.4 cm pulmonary nodules were present within the right lower lobe and a questionable 0.5 cm pulmonary nodule was seen along the right lung base.  0.8 and 0.4 cm left lower lobe nodules were also present.  She went on to receive  Herceptin based adjuvant chemotherapy under the direction of Dr. FBurr Medico  Her follow-up breast MR scan on 03/23/2015 showed interval reduction in the enhancement in the lower inner quadrant of the right breast with resolution of the enlarged right axillary adenopathy consistent with a favorable response.  Her PET scan on 02/21/2015 was without evidence for metabolically active  breast cancer metastases.  She underwent a right partial mastectomy and level I axillary dissection by Dr. BBarry Dieneson 03/28/2014.  There was a 1 mm focus of high-grade invasive ductal carcinoma seen within the non-marginal tissue sections and is considered to be 0.5 cm from the nearest margin (medial).  All 7 lymph nodes were free of metastatic disease.  She is without complaints today except for some stiffness along her right shoulder for which she will shortly begin physical therapy.  Physical examination: Alert and oriented. Filed Vitals:   04/14/15 1243  BP: 124/76  Pulse: 85  Temp: 98.2 F (36.8 C)   Head and neck examination: Grossly unremarkable.  Nodes: Without palpable cervical, supraclavicular, or axillary lymphadenopathy.  Chest: Lungs clear.   Left upper anterior Port-A-Cath.; Breasts: There is a partial mastectomy wound along the lower inner quadrant of the right breast extending from 4 to 5:00.  No masses are appreciated.  Extremities: Without edema.  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  There is clinical support for radiation therapy to the right breast (  or chest wall) along with nodal irradiation in patients who have clinical stage III disease.  Considering the fact that she had essentially a complete response except for focal microscopic disease within the breast, do favor radiation therapy.  She is obvious at risk for failing both local regionally, and distantly.  A question arose as to whether not wish to give stereotactic body radiotherapy to her previously noted pulmonary lesions,  and since there were really multiple lesions there would be obvious difficulty targeting were her lesions were, and also probable pulmonary toxicity even trying to cover the larger lesions.  With her PET scan being negative, I would simply follow her lung and just treat her right breast and regional lymph nodes.  There are no data to support giving stereotactic radiosurgery to the lung in this setting.  This remains investigational.  I will have her return for CT simulation the week of September 12 and begin her treatment as soon afterwards.  Plan: As above.  45 minutes was spent face-to-face with the patient, primarily counseling patient and coordinating her care.

## 2015-04-25 ENCOUNTER — Encounter: Payer: Self-pay | Admitting: Hematology

## 2015-04-25 ENCOUNTER — Ambulatory Visit
Admission: RE | Admit: 2015-04-25 | Discharge: 2015-04-25 | Disposition: A | Discharge status: Home / Self Care | Payer: BC Managed Care – PPO | Source: Ambulatory Visit | Attending: Radiation Oncology | Admitting: Radiation Oncology

## 2015-04-25 DIAGNOSIS — C50911 Malignant neoplasm of unspecified site of right female breast: Secondary | ICD-10-CM

## 2015-04-25 DIAGNOSIS — Z510 Encounter for antineoplastic radiation therapy: Secondary | ICD-10-CM | POA: Diagnosis not present

## 2015-04-25 NOTE — Progress Notes (Signed)
Pt came in to inquire about a pymt we sent for a utility bill. Followed up with Alvester Chou and stop payment will be placed on check and re-issued. Contacted Mrs.Kohlman to let her know. Pt verbalized understanding.

## 2015-04-25 NOTE — Progress Notes (Signed)
Complex simulation/treatment planning note: The patient was taken to the CT simulator.  A Vac lock immobilization device was constructed on a custom breast board.  Her right breast field borders were marked with radiopaque wires along with her partial mastectomy scar.  She was then scanned.  Her CT data set was sent to the planning system where she had contouring of her normal anatomy.  I contoured her medial right breast tumor bed.  She was set up to medial and lateral right breast tangents and also a left supraclavicular field with a PA axillary boost field.  I prescribing 4680 cGy in 26 sessions to her right breast and also left supraclavicular region/axilla.  She is now ready for 3-D simulation.  After completion of her right breast tangents she will undergo a boost of 1000 cGy in 5 sessions to her right breast tumor bed.

## 2015-04-27 ENCOUNTER — Ambulatory Visit (HOSPITAL_BASED_OUTPATIENT_CLINIC_OR_DEPARTMENT_OTHER): Payer: BLUE CROSS/BLUE SHIELD

## 2015-04-27 ENCOUNTER — Encounter: Payer: Self-pay | Admitting: Radiation Oncology

## 2015-04-27 ENCOUNTER — Telehealth: Payer: Self-pay | Admitting: Hematology

## 2015-04-27 ENCOUNTER — Other Ambulatory Visit (HOSPITAL_COMMUNITY)
Admission: RE | Admit: 2015-04-27 | Discharge: 2015-04-27 | Disposition: A | Discharge status: Home / Self Care | Payer: BLUE CROSS/BLUE SHIELD | Source: Ambulatory Visit | Attending: Hematology | Admitting: Hematology

## 2015-04-27 ENCOUNTER — Telehealth: Payer: Self-pay | Admitting: *Deleted

## 2015-04-27 ENCOUNTER — Ambulatory Visit (HOSPITAL_BASED_OUTPATIENT_CLINIC_OR_DEPARTMENT_OTHER): Payer: BLUE CROSS/BLUE SHIELD | Admitting: Nurse Practitioner

## 2015-04-27 VITALS — BP 120/73 | HR 88 | Temp 99.1°F | Resp 18 | Ht 70.0 in | Wt 203.4 lb

## 2015-04-27 DIAGNOSIS — C50311 Malignant neoplasm of lower-inner quadrant of right female breast: Secondary | ICD-10-CM | POA: Diagnosis not present

## 2015-04-27 DIAGNOSIS — Z171 Estrogen receptor negative status [ER-]: Secondary | ICD-10-CM

## 2015-04-27 DIAGNOSIS — C7802 Secondary malignant neoplasm of left lung: Secondary | ICD-10-CM

## 2015-04-27 DIAGNOSIS — Z510 Encounter for antineoplastic radiation therapy: Secondary | ICD-10-CM | POA: Diagnosis not present

## 2015-04-27 DIAGNOSIS — C50919 Malignant neoplasm of unspecified site of unspecified female breast: Secondary | ICD-10-CM

## 2015-04-27 DIAGNOSIS — R197 Diarrhea, unspecified: Secondary | ICD-10-CM | POA: Diagnosis not present

## 2015-04-27 DIAGNOSIS — C7801 Secondary malignant neoplasm of right lung: Secondary | ICD-10-CM | POA: Diagnosis not present

## 2015-04-27 DIAGNOSIS — C50911 Malignant neoplasm of unspecified site of right female breast: Secondary | ICD-10-CM

## 2015-04-27 DIAGNOSIS — D6481 Anemia due to antineoplastic chemotherapy: Secondary | ICD-10-CM | POA: Diagnosis not present

## 2015-04-27 DIAGNOSIS — C7800 Secondary malignant neoplasm of unspecified lung: Principal | ICD-10-CM

## 2015-04-27 LAB — CBC WITH DIFFERENTIAL/PLATELET
BASO%: 0.5 % (ref 0.0–2.0)
Basophils Absolute: 0 10*3/uL (ref 0.0–0.1)
EOS%: 6.6 % (ref 0.0–7.0)
Eosinophils Absolute: 0.5 10*3/uL (ref 0.0–0.5)
HEMATOCRIT: 39.4 % (ref 34.8–46.6)
HEMOGLOBIN: 12.9 g/dL (ref 11.6–15.9)
LYMPH#: 2.7 10*3/uL (ref 0.9–3.3)
LYMPH%: 39.3 % (ref 14.0–49.7)
MCH: 28.2 pg (ref 25.1–34.0)
MCHC: 32.6 g/dL (ref 31.5–36.0)
MCV: 86.5 fL (ref 79.5–101.0)
MONO#: 0.6 10*3/uL (ref 0.1–0.9)
MONO%: 9.3 % (ref 0.0–14.0)
NEUT%: 44.3 % (ref 38.4–76.8)
NEUTROS ABS: 3.1 10*3/uL (ref 1.5–6.5)
PLATELETS: 268 10*3/uL (ref 145–400)
RBC: 4.55 10*6/uL (ref 3.70–5.45)
RDW: 14.7 % — AB (ref 11.2–14.5)
WBC: 7 10*3/uL (ref 3.9–10.3)

## 2015-04-27 LAB — C DIFFICILE QUICK SCREEN W PCR REFLEX
C Diff antigen: NEGATIVE
C Diff interpretation: NEGATIVE
C Diff toxin: NEGATIVE

## 2015-04-27 LAB — COMPREHENSIVE METABOLIC PANEL (CC13)
ALT: 23 U/L (ref 0–55)
ANION GAP: 8 meq/L (ref 3–11)
AST: 24 U/L (ref 5–34)
Albumin: 3.9 g/dL (ref 3.5–5.0)
Alkaline Phosphatase: 72 U/L (ref 40–150)
BILIRUBIN TOTAL: 0.55 mg/dL (ref 0.20–1.20)
BUN: 15.6 mg/dL (ref 7.0–26.0)
CALCIUM: 9.8 mg/dL (ref 8.4–10.4)
CHLORIDE: 104 meq/L (ref 98–109)
CO2: 27 mEq/L (ref 22–29)
CREATININE: 1 mg/dL (ref 0.6–1.1)
EGFR: 82 mL/min/{1.73_m2} — ABNORMAL LOW (ref >90)
Glucose: 94 mg/dl (ref 70–140)
Potassium: 3.8 mEq/L (ref 3.5–5.1)
Sodium: 139 mEq/L (ref 136–145)
TOTAL PROTEIN: 7.9 g/dL (ref 6.4–8.3)

## 2015-04-27 LAB — MAGNESIUM (CC13): MAGNESIUM: 1.9 mg/dL (ref 1.5–2.5)

## 2015-04-27 NOTE — Telephone Encounter (Signed)
Called patient who reports "diarrhea started a week ago that is worsening.  Tried imodium and this has stopped working.  I have diarrhea and not eating.  Was awakened at 0200 and diarrhea was everywhere.  I've lost count but gone ay least eight or more times since then.  Trying to drink, since 0200 32 oz consumed.  No one else in my home has diarrhea."  Advised to come in now.  "I do not have a car.  Will call my mom or my husband."  Advised to call if she can't get here by 1:30 pm for lab.   Verbal order received and read back from Dr. Burr Medico for labs, C-diff. IVF and [facility] visit if she can come in soon.  Order given to scheduler and [name] at this time.  [name] NP gave verbal orders for patient to come in first thing in the morning if unable to come in today.   Awaiting return call from patient or will call her.     Called [name] [name] at [phone].  "MY mom will get dressed and bring me in shortly before 2:00 pm."

## 2015-04-27 NOTE — Telephone Encounter (Signed)
Spoke with patient re [place] appointment today.

## 2015-04-27 NOTE — Progress Notes (Signed)
3-D simulation note: The patient completed 3-D simulation in the management of her carcinoma the right breast.  She was set up to medial and lateral right breast tangents with 2 unique MLCs and 2 unique electronic compensation fields for a total four complex treatment devices.  She is also set up LAO to her right supraclavicular/axillary region with a unique MLC.  Lastly, she is set up PA to her right axilla to bring the midaxillary lymph node dose up to 4500 cGy in 26 sessions.  A unique MLC is constructed for a total of 6 complex treatment devices.  Her breast is treated with 10 MV photons and her right supraclavicular/LAO field is also utilizing 10 MV photons.  The PA right axillary field utilizing 6 MV photons.  The prescription dose of 4680 cGy and 26 sessions.  Dose volume histograms were obtained for the target structures/tumor bed and also avoidance structures including the lungs and heart.  We met our departmental guidelines.

## 2015-04-28 ENCOUNTER — Telehealth: Payer: Self-pay | Admitting: *Deleted

## 2015-04-28 ENCOUNTER — Ambulatory Visit: Payer: BLUE CROSS/BLUE SHIELD | Attending: General Surgery | Admitting: Physical Therapy

## 2015-04-28 DIAGNOSIS — Z9189 Other specified personal risk factors, not elsewhere classified: Secondary | ICD-10-CM | POA: Diagnosis not present

## 2015-04-28 DIAGNOSIS — R609 Edema, unspecified: Secondary | ICD-10-CM | POA: Diagnosis present

## 2015-04-28 DIAGNOSIS — M25611 Stiffness of right shoulder, not elsewhere classified: Secondary | ICD-10-CM | POA: Insufficient documentation

## 2015-04-28 DIAGNOSIS — C50311 Malignant neoplasm of lower-inner quadrant of right female breast: Secondary | ICD-10-CM | POA: Insufficient documentation

## 2015-04-28 NOTE — Therapy (Signed)
[place], [place], [address] Phone: [phone]   Fax:  [phone]  Physical Therapy Evaluation  Patient Details  Name: [name] [name] MRN: [id number] Date of Birth: 1971/07/30 Referring Provider:  Stark Klein, MD  Encounter Date: 04/28/2015      PT End of Session - 04/28/15 1349    Visit Number 1   Number of Visits 9   Date for PT Re-Evaluation 06/03/15   PT Start Time 0810   PT Stop Time 0855   PT Time Calculation (min) 45 min   Activity Tolerance Patient tolerated treatment well   Behavior During Therapy [facility] for tasks assessed/performed      Past Medical History  Diagnosis Date  . Breast cancer   . Pneumonia     hx of pneumonia 08/2013   . GERD (gastroesophageal reflux disease)     during pregnancy   . Breast cancer   . Breast cancer, right breast 04/14/2015    Past Surgical History  Procedure Laterality Date  . Cesarean section    . Essure tubal ligation    . Cholecystectomy    . Wisdom tooth extraction    . Portacath placement Left 07/21/2014    Procedure: INSERTION PORT-A-CATH;  Surgeon: Stark Klein, MD;  Location: [facility];  Service: General;  Laterality: Left;  . Breast lumpectomy with radioactive seed and sentinel lymph node biopsy Right 03/29/2015    Procedure: BREAST LUMPECTOMY WITH RADIOACTIVE SEED AND SENTINEL LYMPH NODE BIOPSY;  Surgeon: Stark Klein, MD;  Location: [place];  Service: General;  Laterality: Right;    There were no vitals filed for this visit.  Visit Diagnosis:  At risk for lymphedema  Postoperative edema      Subjective Assessment - 04/28/15 0816    Subjective fullness and cording in right armpit    Pertinent History Diagnosed 07/02/14 with right T3 ER/PR negative, HER2 positive, axillary node positive breast cancer.  There is an area of concern in one of her lungs and she will be scheduled for a PET scan.  Has had chemo from  08/03/2014 to 03/15/2015, still doing antibody treatment. every 3 weeks,  had right umpectomy on 8/1/6/ 2016 with 7 lymph nodes removed and plans to start  radiation next week.    Patient Stated Goals make sure by body is functioning properly    Currently in Pain? No/denies            [facility] PT Assessment - 04/28/15 0001    Assessment   Medical Diagnosis right breast cancer   Onset Date/Surgical Date 07/02/14   Hand Dominance Right   Precautions   Precautions Other (comment)  active cancer   Balance Screen   Has the patient fallen in the past 6 months Yes   How many times? 1  tripped, will not be addressed this session   Has the patient had a decrease in activity level because of a fear of falling?  No  has had decrease due to fatigue    Is the patient reluctant to leave their home because of a fear of falling?  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   Cognition   Overall Cognitive Status Within Functional Limits for tasks assessed   Observation/Other Assessments   Observations pt with well healed incisions at axilla and medial lower quadrant of breast Fullness observes proximall to axillay incision    Sensation   Light Touch Not tested   Additional Comments pt had some numbness i/tingling in fingers  and feet that is gradually impvoving    Coordination   Gross Motor Movements are Fluid and Coordinated Yes   Posture/Postural Control   Posture/Postural Control No significant limitations   ROM / Strength   AROM / PROM / Strength AROM   AROM   Right Shoulder Extension  --   Right Shoulder Flexion 131 Degrees   Right Shoulder ABduction 123 Degrees   Right Shoulder Internal Rotation 46 Degrees   Right Shoulder External Rotation 80 Degrees   Left Shoulder Extension --   Left Shoulder Flexion --   Left Shoulder ABduction --   Left Shoulder Internal Rotation --   Left Shoulder External Rotation --   Strength   Overall Strength Within functional limits for tasks performed  pt reports she hasn't done much activity since treatment    Palpation   Palpation comment Fullness and firmness at right axilla and with pt reporting some sensation down into arm with palpation            LYMPHEDEMA/ONCOLOGY QUESTIONNAIRE - 04/28/15 1348    Type   Cancer Type right breast cancer           Quick Dash - 04/28/15 0001    Open a tight or new jar No difficulty   Do heavy household chores (wash walls, wash floors) Severe difficulty   Carry a shopping bag or briefcase Moderate difficulty   Wash your back Moderate difficulty   Use a knife to cut food No difficulty   Recreational activities in which you take some force or impact through your arm, shoulder, or hand (golf, hammering, tennis) Severe difficulty   During the past week, to what extent has your arm, shoulder or hand problem interfered with your normal social activities with family, friends, neighbors, or groups? Slightly   During the past week, to what extent has your arm, shoulder or hand problem limited your work or other regular daily activities Slightly   Arm, shoulder, or hand pain. Mild   Tingling (pins and needles) in your arm, shoulder, or hand Mild   Difficulty Sleeping No difficulty   DASH Score 31.82 %             [facility] Adult PT Treatment/Exercise - 04/28/15 0001    Self-Care   Self-Care Other Self-Care Comments   Other Self-Care Comments  foam placed for patient to wear under bra at anterior axilla   Shoulder Exercises: Supine   Other Supine Exercises dowel rod flexion exercise                       Breast Clinic Goals - 07/14/14 1509    Patient will be able to verbalize understanding of pertinent lymphedema risk reduction practices relevant to her diagnosis specifically related to skin care.   Time 1   Period Days   Status Achieved   Patient will be able to return demonstrate and/or verbalize understanding of the post-op home exercise program related to regaining shoulder range of motion.  Time 1   Period Days   Status Achieved   Patient will be able to verbalize understanding of the importance of attending the postoperative After Breast Cancer Class for further lymphedema risk reduction education and therapeutic exercise.   Time 1   Period Days   Status Achieved          Long Term Clinic Goals - 04/28/15 1407    CC Long Term Goal  #1   Title Patient with verbalize an understanding of lymphedema risk reduction precautions   Time 4   Period Weeks   Status New   CC Long Term Goal  #2   Title Patient will be independent in a home exercise program for strenght improvement to decrease lymphedema risk with > 5 nodes removed   Time 4   Period Weeks   Status New   CC Long Term Goal  #3   Title Patient will be know how to obtain and use compression garments for prophylaxis   Time 4   Period Weeks   Status New   CC Long Term Goal  #4   Title Patient will decrease the DASH score to < 20   to demonstrate increased functional use of upper extremity   Baseline 31.82   Time 4   Period Weeks   Status New            Plan - 04/28/15 1350    Clinical Impression Statement Pt comes to PT after several months of chemotherapy and recent lumpectomy with 7 nodes removed. She has some fullness in her right axilla and anterior chest.  She plans to start radiation therapy next week.  She is at risk to develop lymphedema   Pt will benefit from skilled therapeutic intervention in order to improve on the following deficits Impaired UE functional  use;Decreased activity tolerance;Decreased knowledge of precautions;Impaired perceived functional ability;Decreased knowledge of use of DME;Decreased strength;Increased edema;Increased fascial restricitons   Rehab Potential Excellent   Clinical Impairments Affecting Rehab Potential months of chemo with some sensory impairment, starting radiation therapy    PT Frequency 2x / week   PT Duration 4 weeks   PT Treatment/Interventions ADLs/Self Care Home Management;Manual lymph drainage;Compression bandaging;Patient/family education;Passive range of motion;Therapeutic activities;Therapeutic exercise;Manual techniques;Scar mobilization   PT Next Visit Plan Manul lymph drianage with self instruction with emphaisis on area at axilla, manual techniques to cording. AROM exercise to shoulder. Review lymphedema risk reduction practices from ABC class and assess that goal   PT Home Exercise Plan Eventual Strength ABC program althouth she may have to do it without resistance during radiation.   Recommended Other Services ABC class scheduled for 05/02/2015   Consulted and Agree with Plan of Care Patient         Problem List Patient Active Problem List   Diagnosis Date Noted  . Breast cancer, right breast 04/14/2015  . 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 Rash 08/17/2014  . Fever 08/04/2014  . Breast cancer 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 07/22/2014  . Family history of breast cancer 07/22/2014  . Lung mas bilaterial 07/22/2014   Donato Heinz. Owens Shark, PT  04/28/2015, 2:16 PM  [place], [place], [address] Phone: [phone]   Fax:  [phone]

## 2015-04-28 NOTE — Telephone Encounter (Signed)
Pt reports feeling well, has not eaten in fear of diarrhea but able to drink fluids. Diarrhea x 1 today, took 2 imodium, no diarrhea since.  Pt made aware that C. Diff was negative and no antibiotics were needed at this time per Dr. Burr Medico and [name] NP.  Took temp while on the phone- 97.0. Pt voices understanding, states she does not need IVFs; I encouraged her to call us if there is a change in need of IVFs or if diarrhea worsens.

## 2015-04-28 NOTE — Patient Instructions (Signed)
Cane Overhead - Supine  Hold cane at thighs with both hands, extend arms straight over head. Repeat _10__ times. Do __3_ times per day.     Copyright  VHI. All rights reserved.

## 2015-04-29 ENCOUNTER — Encounter: Payer: Self-pay | Admitting: Nurse Practitioner

## 2015-04-29 DIAGNOSIS — R197 Diarrhea, unspecified: Secondary | ICD-10-CM | POA: Insufficient documentation

## 2015-04-29 NOTE — Assessment & Plan Note (Signed)
Patient underwent a right breast lumpectomy on 03/30/2015.  Patient reports chronic diarrhea almost on a daily basis since her surgery.  She is unclear if she was given antibiotics surrounding her surgical procedure.  She reports that she typically has 1 to maximum of 4 diarrhea episodes per day.  She denies any specific abdominal discomfort.  She also denies any nausea or vomiting.  She denies any recent fevers or chills.  Patient states that she continues able to drink and eat fairly well.  She does not feel dehydrated today.  On exam.  Abdomen soft with bowel sounds positive in all 4 quads.  No flank pain.  Patient appears nontoxic.  Vital signs essentially stable; with the exception of a temperature of 99.1.  Blood counts obtained today revealed a BC of 7.0, ANC 3.1, hemoglobin 12.9, and platelet count 268.   electrolyte panel, and magnesium all within normal limits.  Stool for C. difficile collected and was negative.  Patient was encouraged to take 2 Imodium with each loose stool.  She was also advised could prescribed Lomotil if Imodium is not effective.  Once again reviewed option of receiving IV fluid rehydration while at the cancer center; the patient refused.  Patient was able to eat an entire [place] sandwich and drank fluids while at the cancer center.

## 2015-04-29 NOTE — Assessment & Plan Note (Signed)
Breast cancer 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   07/02/2014 Mammogram Mammogram showed a 2cm right beast mass and a 1.8cm right axillary node. MRI breast on 07/16/2014 showed 7cm R breast lesion and 4.4cm r axillary node    07/02/2014 Imaging CT CAP: a 4.7cm mass in LUL lung and a 2.1cm mas in RML, and a small nodule in RUL, suspecious for metastasis    07/09/2014 Initial Diagnosis right IDA with b/l lung lesions, ER-/PR-/HER2+   07/09/2014 Initial Biopsy US guided right breast mass and axillary node biopsy showed IDA, and DCIS, ER-/PR-/HER2+   07/26/2014 Pathologic Stage Left lung mass by IR, path revealed high grade carcinoma, morphology similar to breast tumor biopsy, TTF(-), NapsinA(-), ER(-)   08/04/2014 - 03/22/2015 Chemotherapy weekly Paclitaxel 80mg/m2, trastuzumab and pertuzumab every 3 weeks   10/04/2014 Imaging Interval decrease in the right axillary lymphadenopathy. Bilateral pulmonary lesions with left hilar lymphadenopathy also markedly decreased in the interval. The left hilar lymphadenopathy has resolved.   12/20/2014 Imaging restaging CT showed stable disease, no new lesions    04/12/2015 -  Chemotherapy Herceptin maintenance therapy , 6 mg/kg, every 3 weeks        Patient completed neoadjuvant chemotherapy on 03/22/2015.  Patient underwent a right breast lumpectomy on 03/30/2015.  Patient continues to receive Herceptin infusions on an every three-week basis.  Patient last received Herceptin infusion on 04/12/2015.  Plan is for the patient to return on 05/03/2015 for labs, visit, and her next Herceptin infusion.  

## 2015-04-29 NOTE — Progress Notes (Signed)
SYMPTOM MANAGEMENT CLINIC   HPI: [name] [name] 44 y.o. female diagnosed with breast cancer.  Completed neoadjuvant chemotherapy on 03/22/2015.  Patient is status post right breast lumpectomy on 03/30/2015.  Currently undergoing Herceptin only infusions.   Patient underwent a right breast lumpectomy on 03/30/2015.  Patient reports chronic diarrhea almost on a daily basis since her surgery.  She is unclear if she was given antibiotics surrounding her surgical procedure.  She reports that she typically has 1 to maximum of 4 diarrhea episodes per day.  She denies any specific abdominal discomfort.  She also denies any nausea or vomiting.  She denies any recent fevers or chills.  Patient states that she continues able to drink and eat fairly well.  She does not feel dehydrated today.   HPI  ROS  Past Medical History  Diagnosis Date  . Breast cancer   . Pneumonia     hx of pneumonia 08/2013   . GERD (gastroesophageal reflux disease)     during pregnancy   . Breast cancer   . Breast cancer, right breast 04/14/2015    Past Surgical History  Procedure Laterality Date  . Cesarean section    . Essure tubal ligation    . Cholecystectomy    . Wisdom tooth extraction    . Portacath placement Left 07/21/2014    Procedure: INSERTION PORT-A-CATH;  Surgeon: Stark Klein, MD;  Location: [facility];  Service: General;  Laterality: Left;  . Breast lumpectomy with radioactive seed and sentinel lymph node biopsy Right 03/29/2015    Procedure: BREAST LUMPECTOMY WITH RADIOACTIVE SEED AND SENTINEL LYMPH NODE BIOPSY;  Surgeon: Stark Klein, MD;  Location: [facility];  Service: General;  Laterality: Right;    has Breast cancer 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; Family history of breast cancer; Lung mas bilaterial; Fever; Rash; Anemia in neoplastic disease; Peripheral neuropathy due to chemotherapy; Breast cancer, right breast; and Diarrhea on her problem list.    is allergic to adhesive; aspirin; and  codeine.    Medication List       This list is accurate as of: 04/27/15 11:59 PM.  Always use your most recent med list.               cetirizine 10 MG tablet  Commonly known as:  ZYRTEC  Take 10 mg by mouth daily as needed for allergies (allergies).     clindamycin 1 % gel  Commonly known as:  CLINDAGEL  Apply topically to affected areas BID.     hydrocortisone 2.5 % cream  APPLY TOPICALLY TO THE AFFECTED AREA TWICE DAILY     lidocaine-prilocaine cream  Commonly known as:  EMLA  Apply topically as needed.     loperamide 2 MG tablet  Commonly known as:  IMODIUM A-D  Take 1 tablet (2 mg total) by mouth 4 (four) times daily as needed for diarrhea or loose stools.     LORazepam 1 MG tablet  Commonly known as:  ATIVAN  Take 1 tablet (1 mg total) by mouth once.     NYQUIL PO  Take 30 mLs by mouth 2 (two) times daily as needed (cold/allergies).     ondansetron 8 MG tablet  Commonly known as:  ZOFRAN  Take 1 tablet (8 mg total) by mouth every 8 (eight) hours as needed for nausea or vomiting.     oxyCODONE-acetaminophen 5-325 MG per tablet  Commonly known as:  ROXICET  Take 1-2 tablets by mouth every 4 (four) hours as  needed for severe pain.     pantoprazole 20 MG tablet  Commonly known as:  PROTONIX  Take 1 tablet (20 mg total) by mouth daily.     VICKS VAPORUB EX  Apply 1 application topically at bedtime. Applies under nose, throat and on chest.     zolpidem 5 MG tablet  Commonly known as:  AMBIEN  Take 1 tablet (5 mg total) by mouth at bedtime as needed for sleep.         PHYSICAL EXAMINATION  Oncology Vitals 04/27/2015 04/14/2015 04/12/2015 03/29/2015 03/29/2015 03/29/2015 03/29/2015  Height 178 cm 178 cm 178 cm - - - -  Weight 92.262 kg 93.396 kg 94.847 kg - - - -  Weight (lbs) 203 lbs 6 oz 205 lbs 14 oz 209 lbs 2 oz - - - -  BMI (kg/m2) 29.18 kg/m2 29.54 kg/m2 30 kg/m2 - - - -  Temp 99.1 98.2 98.5 - 98.4 - -  Pulse 88 85 85 94 98 94 104  Resp 18 - 18 - _0 SpO2 100 - 100 100 100 100 100  BSA (m2) 2.14 m2 2.15 m2 2.16 m2 - - - -   BP Readings from Last 3 Encounters:  04/27/15 120/73  04/14/15 124/76  04/12/15 131/71    Physical Exam  Constitutional: She is oriented to person, place, and time and well-developed, well-nourished, and in no distress.  HENT:  Head: Normocephalic and atraumatic.  Eyes: Conjunctivae and EOM are normal. Pupils are equal, round, and reactive to light. Right eye exhibits no discharge. Left eye exhibits no discharge. No scleral icterus.  Neck: Normal range of motion. Neck supple. No JVD present. No tracheal deviation present. No thyromegaly present.  Cardiovascular: Normal rate, regular rhythm, normal heart sounds and intact distal pulses.   Pulmonary/Chest: Breath sounds normal. No respiratory distress. She has no wheezes. She has no rales. She exhibits no tenderness.  Abdominal: Soft. Bowel sounds are normal. She exhibits no distension and no mass. There is no tenderness. There is no rebound and no guarding.  Musculoskeletal: Normal range of motion. She exhibits no edema or tenderness.  Lymphadenopathy:    She has no cervical adenopathy.  Neurological: She is alert and oriented to person, place, and time. Gait normal.  Skin: Skin is warm and dry. No rash noted. No erythema. No pallor.  Psychiatric: Affect normal.  Nursing note and vitals reviewed.   LABORATORY DATA:. Appointment on 04/27/2015  Component Date Value Ref Range Status  . WBC 04/27/2015 7.0  3.9 - 10.3 10e3/uL Final  . NEUT# 04/27/2015 3.1  1.5 - 6.5 10e3/uL Final  . HGB 04/27/2015 12.9  11.6 - 15.9 g/dL Final  . HCT 04/27/2015 39.4  34.8 - 46.6 % Final  . Platelets 04/27/2015 268  145 - 400 10e3/uL Final  . MCV 04/27/2015 86.5  79.5 - 101.0 fL Final  . MCH 04/27/2015 28.2  25.1 - 34.0 pg Final  . MCHC 04/27/2015 32.6  31.5 - 36.0 g/dL Final  . RBC 04/27/2015 4.55  3.70 - 5.45 10e6/uL Final  . RDW 04/27/2015 14.7* 11.2 - 14.5 % Final  .  lymph# 04/27/2015 2.7  0.9 - 3.3 10e3/uL Final  . MONO# 04/27/2015 0.6  0.1 - 0.9 10e3/uL Final  . Eosinophils Absolute 04/27/2015 0.5  0.0 - 0.5 10e3/uL Final  . Basophils Absolute 04/27/2015 0.0  0.0 - 0.1 10e3/uL Final  . NEUT% 04/27/2015 44.3  38.4 - 76.8 % Final  . LYMPH% 04/27/2015  39.3  14.0 - 49.7 % Final  . MONO% 04/27/2015 9.3  0.0 - 14.0 % Final  . EOS% 04/27/2015 6.6  0.0 - 7.0 % Final  . BASO% 04/27/2015 0.5  0.0 - 2.0 % Final  . Sodium 04/27/2015 139  136 - 145 mEq/L Final  . Potassium 04/27/2015 3.8  3.5 - 5.1 mEq/L Final  . Chloride 04/27/2015 104  98 - 109 mEq/L Final  . CO2 04/27/2015 27  22 - 29 mEq/L Final  . Glucose 04/27/2015 94  70 - 140 mg/dl Final   Glucose reference range is for nonfasting patients. Fasting glucose reference range is 70- 100.  Marland Kitchen BUN 04/27/2015 15.6  7.0 - 26.0 mg/dL Final  . Creatinine 04/27/2015 1.0  0.6 - 1.1 mg/dL Final  . Total Bilirubin 04/27/2015 0.55  0.20 - 1.20 mg/dL Final  . Alkaline Phosphatase 04/27/2015 72  40 - 150 U/L Final  . AST 04/27/2015 24  5 - 34 U/L Final  . ALT 04/27/2015 23  0 - 55 U/L Final  . Total Protein 04/27/2015 7.9  6.4 - 8.3 g/dL Final  . Albumin 04/27/2015 3.9  3.5 - 5.0 g/dL Final  . Calcium 04/27/2015 9.8  8.4 - 10.4 mg/dL Final  . Anion Gap 04/27/2015 8  3 - 11 mEq/L Final  . EGFR 04/27/2015 82* >90 ml/min/1.73 m2 Final   eGFR is calculated using the CKD-EPI Creatinine Equation (2009)  . Magnesium 04/27/2015 1.9  1.5 - 2.5 mg/dl Final  Hospital Outpatient Visit on 04/27/2015  Component Date Value Ref Range Status  . C Diff antigen 04/27/2015 NEGATIVE  NEGATIVE Final  . C Diff toxin 04/27/2015 NEGATIVE  NEGATIVE Final  . C Diff interpretation 04/27/2015 Negative for toxigenic C. difficile   Final     RADIOGRAPHIC STUDIES: No results found.  ASSESSMENT/PLAN:    Diarrhea Patient underwent a right breast lumpectomy on 03/30/2015.  Patient reports chronic diarrhea almost on a daily basis since her  surgery.  She is unclear if she was given antibiotics surrounding her surgical procedure.  She reports that she typically has 1 to maximum of 4 diarrhea episodes per day.  She denies any specific abdominal discomfort.  She also denies any nausea or vomiting.  She denies any recent fevers or chills.  Patient states that she continues able to drink and eat fairly well.  She does not feel dehydrated today.  On exam.  Abdomen soft with bowel sounds positive in all 4 quads.  No flank pain.  Patient appears nontoxic.  Vital signs essentially stable; with the exception of a temperature of 99.1.  Blood counts obtained today revealed a BC of 7.0, ANC 3.1, hemoglobin 12.9, and platelet count 268.   electrolyte panel, and magnesium all within normal limits.  Stool for C. difficile collected and was negative.  Patient was encouraged to take 2 Imodium with each loose stool.  She was also advised could prescribed Lomotil if Imodium is not effective.  Once again reviewed option of receiving IV fluid rehydration while at the cancer center; the patient refused.  Patient was able to eat an entire [place] sandwich and drank fluids while at the cancer center.    Breast cancer 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 Breast cancer 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   07/02/2014 Mammogram Mammogram showed a 2cm right beast mass and a 1.8cm right axillary node. 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 Bilateral pulmonary lesions with left hilar lymphadenopathy also markedly decreased in the interval. The left hilar lymphadenopathy has resolved.   12/20/2014 Imaging restaging CT showed stable disease, no new lesions    04/12/2015 -  Chemotherapy Herceptin maintenance therapy , 6 mg/kg, every 3 weeks        Patient completed neoadjuvant chemotherapy on 03/22/2015.  Patient underwent a right breast lumpectomy on 03/30/2015.  Patient continues to receive Herceptin infusions on an every three-week basis.  Patient last received Herceptin infusion on 04/12/2015.  Plan is for the patient to return on 05/03/2015 for labs, visit, and her next Herceptin infusion.   Patient stated understanding of all instructions; and was in agreement with this plan of care. The patient knows to call the clinic with any problems, questions or concerns.   Review/collaboration with Dr. FBurr Medicoregarding all aspects of patient's visit today.   Total time spent with patient was 25 minutes;  with greater than 75 percent of that time spent in face to face counseling regarding patient's symptoms,  and coordination of care and follow up.  Disclaimer:This dictation was prepared with Dragon/digital dictation along with [employer] Any transcriptional errors that result from this process are unintentional.  BDrue Second NP 04/29/2015

## 2015-05-02 ENCOUNTER — Ambulatory Visit
Admission: RE | Admit: 2015-05-02 | Discharge: 2015-05-02 | Disposition: A | Discharge status: Home / Self Care | Payer: BC Managed Care – PPO | Source: Ambulatory Visit | Attending: Radiation Oncology | Admitting: Radiation Oncology

## 2015-05-02 DIAGNOSIS — Z510 Encounter for antineoplastic radiation therapy: Secondary | ICD-10-CM | POA: Diagnosis not present

## 2015-05-03 ENCOUNTER — Ambulatory Visit
Admission: RE | Admit: 2015-05-03 | Discharge: 2015-05-03 | Disposition: A | Discharge status: Home / Self Care | Payer: BC Managed Care – PPO | Source: Ambulatory Visit | Attending: Radiation Oncology | Admitting: Radiation Oncology

## 2015-05-03 ENCOUNTER — Ambulatory Visit (HOSPITAL_BASED_OUTPATIENT_CLINIC_OR_DEPARTMENT_OTHER): Payer: BLUE CROSS/BLUE SHIELD

## 2015-05-03 ENCOUNTER — Ambulatory Visit (HOSPITAL_BASED_OUTPATIENT_CLINIC_OR_DEPARTMENT_OTHER): Payer: BLUE CROSS/BLUE SHIELD | Admitting: Hematology

## 2015-05-03 ENCOUNTER — Telehealth: Payer: Self-pay | Admitting: Hematology

## 2015-05-03 ENCOUNTER — Encounter: Payer: Self-pay | Admitting: Hematology

## 2015-05-03 ENCOUNTER — Other Ambulatory Visit (HOSPITAL_BASED_OUTPATIENT_CLINIC_OR_DEPARTMENT_OTHER): Payer: BLUE CROSS/BLUE SHIELD

## 2015-05-03 ENCOUNTER — Ambulatory Visit: Payer: BLUE CROSS/BLUE SHIELD

## 2015-05-03 ENCOUNTER — Ambulatory Visit
Admission: RE | Admit: 2015-05-03 | Discharge: 2015-05-03 | Disposition: A | Discharge status: Home / Self Care | Payer: BLUE CROSS/BLUE SHIELD | Source: Ambulatory Visit | Attending: Radiation Oncology | Admitting: Radiation Oncology

## 2015-05-03 VITALS — BP 139/85 | HR 75 | Temp 98.9°F | Resp 20 | Ht 70.0 in | Wt 209.8 lb

## 2015-05-03 DIAGNOSIS — C7800 Secondary malignant neoplasm of unspecified lung: Secondary | ICD-10-CM | POA: Diagnosis not present

## 2015-05-03 DIAGNOSIS — Z95828 Presence of other vascular implants and grafts: Secondary | ICD-10-CM

## 2015-05-03 DIAGNOSIS — Z5112 Encounter for antineoplastic immunotherapy: Secondary | ICD-10-CM

## 2015-05-03 DIAGNOSIS — C7802 Secondary malignant neoplasm of left lung: Secondary | ICD-10-CM

## 2015-05-03 DIAGNOSIS — C50911 Malignant neoplasm of unspecified site of right female breast: Secondary | ICD-10-CM

## 2015-05-03 DIAGNOSIS — C7801 Secondary malignant neoplasm of right lung: Secondary | ICD-10-CM | POA: Insufficient documentation

## 2015-05-03 DIAGNOSIS — C50311 Malignant neoplasm of lower-inner quadrant of right female breast: Secondary | ICD-10-CM

## 2015-05-03 DIAGNOSIS — Z510 Encounter for antineoplastic radiation therapy: Secondary | ICD-10-CM | POA: Diagnosis not present

## 2015-05-03 DIAGNOSIS — C50919 Malignant neoplasm of unspecified site of unspecified female breast: Secondary | ICD-10-CM | POA: Diagnosis not present

## 2015-05-03 LAB — COMPREHENSIVE METABOLIC PANEL (CC13)
ALT: 19 U/L (ref 0–55)
AST: 22 U/L (ref 5–34)
Albumin: 3.4 g/dL — ABNORMAL LOW (ref 3.5–5.0)
Alkaline Phosphatase: 57 U/L (ref 40–150)
Anion Gap: 7 mEq/L (ref 3–11)
BILIRUBIN TOTAL: 0.31 mg/dL (ref 0.20–1.20)
BUN: 8.5 mg/dL (ref 7.0–26.0)
CHLORIDE: 108 meq/L (ref 98–109)
CO2: 27 meq/L (ref 22–29)
CREATININE: 0.8 mg/dL (ref 0.6–1.1)
Calcium: 9.1 mg/dL (ref 8.4–10.4)
EGFR: >90 mL/min/{1.73_m2} (ref >90)
GLUCOSE: 105 mg/dL (ref 70–140)
Potassium: 3.5 mEq/L (ref 3.5–5.1)
SODIUM: 142 meq/L (ref 136–145)
TOTAL PROTEIN: 6.7 g/dL (ref 6.4–8.3)

## 2015-05-03 LAB — CBC WITH DIFFERENTIAL/PLATELET
BASO%: 0.5 % (ref 0.0–2.0)
Basophils Absolute: 0 10*3/uL (ref 0.0–0.1)
EOS%: 6.4 % (ref 0.0–7.0)
Eosinophils Absolute: 0.3 10*3/uL (ref 0.0–0.5)
HCT: 33.8 % — ABNORMAL LOW (ref 34.8–46.6)
HGB: 11.1 g/dL — ABNORMAL LOW (ref 11.6–15.9)
LYMPH%: 32.7 % (ref 14.0–49.7)
MCH: 28.5 pg (ref 25.1–34.0)
MCHC: 32.9 g/dL (ref 31.5–36.0)
MCV: 86.5 fL (ref 79.5–101.0)
MONO#: 0.4 10*3/uL (ref 0.1–0.9)
MONO%: 6.8 % (ref 0.0–14.0)
NEUT%: 53.6 % (ref 38.4–76.8)
NEUTROS ABS: 2.9 10*3/uL (ref 1.5–6.5)
Platelets: 240 10*3/uL (ref 145–400)
RBC: 3.91 10*6/uL (ref 3.70–5.45)
RDW: 14.1 % (ref 11.2–14.5)
WBC: 5.4 10*3/uL (ref 3.9–10.3)
lymph#: 1.8 10*3/uL (ref 0.9–3.3)

## 2015-05-03 MED ORDER — ACETAMINOPHEN 325 MG PO TABS
650.0000 mg | ORAL_TABLET | Freq: Once | ORAL | Status: AC
  Administered 2015-05-03: 650 mg via ORAL

## 2015-05-03 MED ORDER — ACETAMINOPHEN 325 MG PO TABS
ORAL_TABLET | ORAL | Status: AC
  Filled 2015-05-03: qty 2

## 2015-05-03 MED ORDER — TRASTUZUMAB CHEMO INJECTION 440 MG
6.0000 mg/kg | Freq: Once | INTRAVENOUS | Status: AC
  Administered 2015-05-03: 567 mg via INTRAVENOUS
  Filled 2015-05-03: qty 27

## 2015-05-03 MED ORDER — HEPARIN SOD (PORK) LOCK FLUSH 100 UNIT/ML IV SOLN
500.0000 [IU] | Freq: Once | INTRAVENOUS | Status: AC | PRN
  Administered 2015-05-03: 500 [IU]
  Filled 2015-05-03: qty 5

## 2015-05-03 MED ORDER — SODIUM CHLORIDE 0.9 % IJ SOLN
10.0000 mL | INTRAMUSCULAR | Status: DC | PRN
  Administered 2015-05-03: 10 mL via INTRAVENOUS
  Filled 2015-05-03: qty 10

## 2015-05-03 MED ORDER — DIPHENHYDRAMINE HCL 25 MG PO CAPS
50.0000 mg | ORAL_CAPSULE | Freq: Once | ORAL | Status: AC
  Administered 2015-05-03: 50 mg via ORAL

## 2015-05-03 MED ORDER — SODIUM CHLORIDE 0.9 % IV SOLN
Freq: Once | INTRAVENOUS | Status: AC
  Administered 2015-05-03: 12:00:00 via INTRAVENOUS

## 2015-05-03 MED ORDER — SODIUM CHLORIDE 0.9 % IJ SOLN
10.0000 mL | INTRAMUSCULAR | Status: DC | PRN
  Administered 2015-05-03: 10 mL
  Filled 2015-05-03: qty 10

## 2015-05-03 MED ORDER — RADIAPLEXRX EX GEL
Freq: Once | CUTANEOUS | Status: AC
  Administered 2015-05-03: 16:00:00 via TOPICAL

## 2015-05-03 MED ORDER — DIPHENHYDRAMINE HCL 25 MG PO CAPS
ORAL_CAPSULE | ORAL | Status: AC
  Filled 2015-05-03: qty 2

## 2015-05-03 MED ORDER — ALRA NON-METALLIC DEODORANT (RAD-ONC)
1.0000 "application " | Freq: Once | TOPICAL | Status: AC
  Administered 2015-05-03: 1 via TOPICAL

## 2015-05-03 NOTE — Progress Notes (Signed)
Pt education done, gave alra,radiaplex gel, Radiation therapy and you book, [place] card, discussed ways to manage side effects, fatigue, skin irritation, swelling of breast,tenderness, pain, increase protein in diet, stay hydrated, use skin producats after rad tx daily and bedtime, teach back given 3:48 PM

## 2015-05-03 NOTE — Telephone Encounter (Signed)
Gave and printed appt sched and avs for pt for Sept thru [facility]

## 2015-05-03 NOTE — Progress Notes (Signed)
[name] [name]  Follow up note   Patient Care Team: [name], DO as PCP - General (Family Medicine) [name], DO (Family Medicine) Holley Bouche, NP as Nurse Practitioner (Nurse Practitioner) Stark Klein, MD as Consulting Physician (General Surgery) Arloa Koh, MD as Consulting Physician (Radiation Oncology) [name], MD as Consulting Physician (Hematology)  CHIEF COMPLAINTS Follow up metastatic breast cancer  Oncology History   Breast cancer 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   Staging form: Breast, AJCC 7th Edition     Clinical stage from 07/22/2014: Stage IV (T3, N1, M1) - Unsigned       Breast cancer 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   07/02/2014 Mammogram Mammogram showed a 2cm right beast mass and a 1.8cm right axillary node. MRI breast on 07/16/2014 showed 7cm R breast lesion and 4.4cm r axillary node    07/02/2014 Imaging CT CAP: a 4.7cm mass in LUL lung and a 2.1cm mas in RML, and a small nodule in RUL, suspecious for metastasis     07/09/2014 Initial Diagnosis right IDA with b/l lung lesions, ER-/PR-/HER2+   07/09/2014 Initial Biopsy US guided right breast mass and axillary node biopsy showed IDA, and DCIS, ER-/PR-/HER2+   07/26/2014 Pathologic Stage Left lung mass by IR, path revealed high grade carcinoma, morphology similar to breast tumor biopsy, TTF(-), NapsinA(-), ER(-)   08/04/2014 - 03/22/2015 Chemotherapy weekly Paclitaxel 34m/m2, trastuzumab and pertuzumab every 3 weeks   10/04/2014 Imaging Interval decrease in the right axillary lymphadenopathy. Bilateral pulmonary lesions with left hilar lymphadenopathy also markedly decreased in the interval. The left hilar lymphadenopathy has resolved.   12/20/2014 Imaging restaging CT showed stable disease, no new lesions    03/29/2015 Pathology Results  right breast lumpectomy showed  chemotherapy treatment effect,  a 1 mm residual tumor,   margins were widely negative, 5 sentinel lymph nodes and 2 axillary lymph nodes were  negative.    03/29/2015 Surgery  right breast lumpectomy and sentinel lymph node biopsy  by Dr. BBarry Dienes  04/12/2015 -  Chemotherapy  Herceptin maintenance therapy , 6 mg/kg, every 3 weeks    CURRENT THERAPY: Breast irradiation, started on 05/03/2015. Herceptin maintenance therapy every 3 weeks, started on 04/12/2059  INTERVAL HISTROY   NKalenareturns for follow-up.  She started breast irradiation yesterday. She had diarrhea for a few days, no abdominal pain or fever, she was seen by our symptom management clinic. Her diarrhea has resolved. She is doing well overall. She has recovered well from surgery. Her hair has started growing back, her nails are getting better also.   MEDICAL HISTORY:  Past Medical History  Diagnosis Date  . Breast cancer   . Pneumonia     hx of pneumonia 08/2013   . GERD (gastroesophageal reflux disease)     during pregnancy   . Breast cancer   . Breast cancer, right breast 04/14/2015    SURGICAL HISTORY: Past Surgical History  Procedure Laterality Date  . Cesarean section    . Essure tubal ligation    . Cholecystectomy    . Wisdom tooth extraction    . Portacath placement Left 07/21/2014    Procedure: INSERTION PORT-A-CATH;  Surgeon: FStark Klein MD;  Location: [facility];  Service: General;  Laterality: Left;  . Breast lumpectomy with radioactive seed and sentinel lymph node biopsy Right 03/29/2015    Procedure: BREAST LUMPECTOMY WITH RADIOACTIVE SEED AND SENTINEL LYMPH NODE BIOPSY;  Surgeon: FStark Klein MD;  Location[facility]  Service: General;  Laterality: Right;  SOCIAL HISTORY: History   Social History  . Marital Status: Married    Spouse Name: N/A    Number of Children:  she has 2 daughters at the age of 62 and 71.   . Years of Education: N/A   Occupational History  .  works as Freight forwarder for a [employer].    Social History Main Topics  . Smoking status: Never Smoker   . Smokeless tobacco: Not on file  . Alcohol Use:  Yes  . Drug Use: No  . Sexual Activity: No    FAMILY HISTORY: Family History  Problem Relation Age of Onset  . Breast cancer Mother 36  . Liver cancer Father 47  . Breast cancer Maternal Aunt 36  . Prostate cancer Maternal Grandfather   . Liver cancer Paternal Grandmother   . Prostate cancer Paternal Grandfather      GENETICS: 08/30/2014 BreastNext panel was negative. 17 genes including BRCA1, BRCA2, were negative for mutations.   ALLERGIES:  is allergic to adhesive; aspirin; and codeine.  MEDICATIONS:  Current Outpatient Prescriptions  Medication Sig Dispense Refill  . Camphor-Eucalyptus-Menthol (VICKS VAPORUB EX) Apply 1 application topically at bedtime. Applies under nose, throat and on chest.    . cetirizine (ZYRTEC) 10 MG tablet Take 10 mg by mouth daily as needed for allergies (allergies).     . clindamycin (CLINDAGEL) 1 % gel Apply topically to affected areas BID. (Patient not taking: Reported on 04/27/2015) 60 g 3  . hydrocortisone 2.5 % cream APPLY TOPICALLY TO THE AFFECTED AREA TWICE DAILY (Patient not taking: Reported on 04/27/2015) 454 g 0  . lidocaine-prilocaine (EMLA) cream Apply topically as needed. (Patient not taking: Reported on 04/27/2015) 30 g 2  . loperamide (IMODIUM A-D) 2 MG tablet Take 1 tablet (2 mg total) by mouth 4 (four) times daily as needed for diarrhea or loose stools. (Patient not taking: Reported on 04/27/2015) 30 tablet 0  . LORazepam (ATIVAN) 1 MG tablet Take 1 tablet (1 mg total) by mouth once. (Patient not taking: Reported on 04/27/2015) 2 tablet 0  . ondansetron (ZOFRAN) 8 MG tablet Take 1 tablet (8 mg total) by mouth every 8 (eight) hours as needed for nausea or vomiting. 30 tablet 3  . oxyCODONE-acetaminophen (ROXICET) 5-325 MG per tablet Take 1-2 tablets by mouth every 4 (four) hours as needed for severe pain. (Patient not taking: Reported on 04/27/2015) 30 tablet 0  . pantoprazole (PROTONIX) 20 MG tablet Take 1 tablet (20 mg total) by mouth daily.  (Patient not taking: Reported on 04/27/2015) 30 tablet 3  . Pseudoeph-Doxylamine-DM-APAP (NYQUIL PO) Take 30 mLs by mouth 2 (two) times daily as needed (cold/allergies).     . zolpidem (AMBIEN) 5 MG tablet Take 1 tablet (5 mg total) by mouth at bedtime as needed for sleep. (Patient not taking: Reported on 04/27/2015) 20 tablet 0   No current facility-administered medications for this visit.    REVIEW OF SYSTEMS:   Constitutional: Denies fevers, chills or abnormal night sweats, (+) malaise  Eyes: Denies blurriness of vision, double vision or watery eyes Ears, nose, mouth, throat, and face: Denies mucositis or sore throat Respiratory: (+) productive cough, no dyspnea or wheezes Cardiovascular: Denies palpitation, chest discomfort or lower extremity swelling Gastrointestinal:  Denies nausea, heartburn or change in bowel habits Skin:(+) rashes  Lymphatics: Denies new lymphadenopathy or easy bruising Neurological:Denies numbness, tingling or new weaknesses Behavioral/Psych: Mood is stable, no new changes  All other systems were reviewed with the patient and are  negative.  PHYSICAL EXAMINATION: ECOG PERFORMANCE STATUS: 1 BP 139/85 mmHg  Pulse 75  Temp(Src) 98.9 F (37.2 C) (Oral)  Resp 20  Ht 5' 10"  (1.778 m)  Wt 209 lb 12.8 oz (95.165 kg)  BMI 30.10 kg/m2  SpO2 100% GENERAL:alert, no distress and comfortable SKIN: skin color, texture, turgor are normal, no rashes or significant lesions EYES: normal, conjunctiva are pink and non-injected, sclera clear OROPHARYNX:no exudate, no erythema and lips, buccal mucosa, and tongue normal  NECK: supple, thyroid normal size, non-tender, without nodularity LYMPH:  no palpable lymphadenopathy in the cervical, axillary or inguinal LUNGS: clear to auscultation and percussion with normal breathing effort HEART: regular rate & rhythm and no murmurs and no lower extremity edema ABDOMEN:abdomen soft, non-tender and normal bowel sounds Musculoskeletal:no  cyanosis of digits and no clubbing  PSYCH: alert & oriented x 3 with fluent speech NEURO: no focal motor/sensory deficits Breasts:  Status post right breast lumpectomy and sentinel lymph node biopsy, incisions have healed well. No surrounding skin erythema or discharge.Breast inspection showed them to be symmetrical with no skin change or nipple discharge. Palpation of bilateral breasts  and axilla revealed no obvious mass that I could appreciate. Skin: (+) Skin pigmentation on hands and feet, lighter than before.  a few acne-like rash on face and neck. No new skin rashes. (+)  pigmentation on fingernails, and bulging of fingernails and thickening of soft tissue under nailbed.   LABORATORY DATA:  I have reviewed the data as listed CBC Latest Ref Rng 04/27/2015 04/12/2015 03/22/2015  WBC 3.9 - 10.3 10e3/uL 7.0 8.9 7.0  Hemoglobin 11.6 - 15.9 g/dL 12.9 11.7 11.1(L)  Hematocrit 34.8 - 46.6 % 39.4 35.8 33.6(L)  Platelets 145 - 400 10e3/uL 268 269 314    CMP Latest Ref Rng 04/27/2015 04/12/2015 03/22/2015  Glucose 70 - 140 mg/dl 94 106 109  BUN 7.0 - 26.0 mg/dL 15.6 12.9 10.7  Creatinine 0.6 - 1.1 mg/dL 1.0 0.8 0.8  Sodium 136 - 145 mEq/L 139 141 143  Potassium 3.5 - 5.1 mEq/L 3.8 3.8 3.7  Chloride 96 - 112 mmol/L - - -  CO2 22 - 29 mEq/L 27 24 31(H)  Calcium 8.4 - 10.4 mg/dL 9.8 9.6 9.3  Total Protein 6.4 - 8.3 g/dL 7.9 7.2 6.9  Total Bilirubin 0.20 - 1.20 mg/dL 0.55 0.40 0.38  Alkaline Phos 40 - 150 U/L 72 68 64  AST 5 - 34 U/L 24 25 18   ALT 0 - 55 U/L 23 24 21      PATHOLOGY REPORT 07/09/2014 #1 breast, right needle core biopsy more anterior -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 45)  Lung, biopsy, Left 07/26/2014 - HIGH GRADE CARCINOMA, SEE COMMENT. Microscopic  Comment The carcinoma demonstrates the following immunophenotype: TTF-1 - negative expression. Napsin A- negative expression. CK5/6 - focal, moderate expression. estrogen receptor - negative expression. GCDFP- negative expression. The recent breast biopsy demonstrating Her2 amplified high grade invasive mammary carcinoma is noted ([id number]). Although the immunophenotype of the current case is non-sepcific, it strongly argues against primary lung adenocarcinoma. However, on re-review, the morphology of the current case is essentially identical to the primary mammary carcinoma. In lieu of further immunophenotyping, the tumor will be submitted for Her2 testing and the remaining tissue will be reserved for additional ancillary tumor testing. The results  of the Her2 testing will be reported in an addendum. The case was discussed with Dr Burr Medico on 07/28/2015 and 07/29/2015 HER2 POSITIVE   Diagnosis 03/29/2015 1. Breast, lumpectomy, Right - INVASIVE DUCTAL CARCINOMA, SEE COMMENT. - SEE TUMOR SYNOPTIC TEMPLATE BELOW. 2. Lymph node, sentinel, biopsy, Right axillary #1 - ONE LYMPH NODE, NEGATIVE FOR TUMOR (0/1). - SEE COMMENT. 3. Lymph node, sentinel, biopsy, Right axillary #2 - BENIGN FIBROFATTY SOFT TISSUE. - NEGATIVE FOR ATYPIA OR MALIGNANCY. - NEGATIVE FOR LYMPH NODE. 4. Lymph node, sentinel, biopsy, Right axillary #3 - ONE LYMPH NODE, NEGATIVE FOR TUMOR (0/1). - SEE COMMENT. 5. Lymph node, sentinel, biopsy, Right axillary #4 - ONE LYMPH NODE, NEGATIVE FOR TUMOR (0/1). - SEE COMMENT. 6. Lymph node, sentinel, biopsy, Right axillary #5 - ONE LYMPH NODE, NEGATIVE FOR TUMOR (0/1). - SEE COMMENT. 7. Lymph node, sentinel, biopsy, right axillary - ONE LYMPH NODE, NEGATIVE FOR TUMOR (0/1). - SEE COMMENT. 8. Lymph node, sentinel, biopsy, right axillary - ONE LYMPH NODE, NEGATIVE FOR TUMOR (0/1). - SEE COMMENT. 1 of 4 Amended copy Amended FINAL for ANNALEIA, PENCE  ([id number]) Microscopic Comment 1. BREAST, INVASIVE TUMOR, WITH LYMPH NODES PRESENT Specimen, including laterality and lymph node sampling (sentinel, non-sentinel): Right breast with sentinel and non-sentinel lymph node sampling Procedure: Lumpectomy Histologic type: Ductal, see comment Grade: 2 of 3, see comment. Tubule formation: 3 Nuclear pleomorphism: 3 Mitotic: 1 Tumor size (glass slide measurement): 1 mm, see comment Margins: Invasive, distance to closest margin: See comment In-situ, distance to closest margin: N/A If margin positive, focally or broadly: N/A Lymphovascular invasion: Absent Ductal carcinoma in situ: Absent Grade: N/A Extensive intraductal component: N/A Lobular neoplasia: Absent Tumor focality: Unifocal, see comment Treatment effect: Present If present, treatment effect in breast tissue, lymph nodes or both: Both breast tissue and lymph nodes Extent of tumor: Skin: N/A Nipple: N/A Skeletal muscle: N/A Lymph nodes: Examined: 5 Sentinel 2 Non-sentinel 7 Total Lymph nodes with metastasis: 0 Isolated tumor cells (< 0.2 mm): N/A Micrometastasis: (> 0.2 mm and < 2.0 mm): N/A Macrometastasis: (> 2.0 mm): N/A Extracapsular extension: N/A Breast prognostic profile: Estrogen receptor: Not repeated, previous study demonstrated 0% positivity ([id number]). Progesterone receptor: Not repeated, previous study demonstrated 0% positivity ([id number]) Her 2 neu: Demonstrates amplificaition ([id number]) Ki-67: Not repeated, previous study demonstrate 79% proliferation rate ([id number]). Non-neoplastic breast: Neoadjuvant related tissue changes and adenosis. TNM: ypT81m, ypN0, pMX Comments: Numerous representative sections including the 2.0 cm hemorrhagic tissue associated with X-shaped biopsy clip, the 2.0 cm ill defined lesion associated with M-shaped clip, and intervening tissue were submitted for review. Slide sections from the tissue surrounding the  X-shaped and M-shaped clips demonstrate tissue changes consistent with neoadjuvant related change. There is no invasive or in situ carcinom present at either site. However, in a representative section from the intervening tissue (slide G), there is a 1 mm focus of high grade invasive ductal carcinoma present. Given the minute size of the tumor, tumor grading is limited. The tumor is present within non-marginal tissue sections and is considered to be at least 0.5 cm from the nearest margin (medial).   Note: Amendment issued for modification of synoptic table, inadvertent typographical error. The change in the synoptic table was discussed with Dr FBurr Medicoon 04/07/2015. (CRR:ecj 04/07/2015) 2. , 4-8. In parts 2 and 4, there is extensive neoadjuvant related tissue changes including abundant foamy macrophages, fibroinflammatory reaction and dystrophic calcifications. The neoadjuvant tissue changes extend into perinodal soft tissue and are either focally or broadly  present at the cauterized edge of the tissue submitted. There are no definitive features of malignancy present.  RADIOGRAPHIC STUDIES: CT CAP 10/04/2014 IMPRESSION: Interval decrease in the right axillary lymphadenopathy.  Bilateral pulmonary lesions with left hilar lymphadenopathy also markedly decreased in the interval. The left hilar lymphadenopathy has resolved.  3 millimeter nonobstructing right renal stone.   CT chest, abdomen and pelvis with contrast 12/20/2014 IMPRESSION: 1. Today's study is very similar to the prior examination from 10/04/2014, demonstrating stable size of metastatic lesions in the right middle lobe and left upper lobe, and no new evidence of metastatic disease in the thorax. 2. 2.0 x 0.9 cm lesion in the medial aspect of the right breast is similar to prior examination. No residual right axillary lymphadenopathy. 3. As with prior examinations, there is no evidence of metastatic disease to the abdomen or  pelvis. 4. 3 mm nonobstructive calculus in the lower pole collecting system of the right kidney.  PET scan 02/21/2015  IMPRESSION: 1. No evidence of metabolically active breast cancer metastasis. 2. Stable parenchymal thickening in the upper lobes at site of prior pulmonary masses. These stable pulmonary parenchymal thicken does not have associated metabolic activity.   ECHO 02/10/2015  Study Conclusions  - Left ventricle: LV longitudinal strain was -18.3% The cavity size was normal. Systolic function was normal. The estimated ejection fraction was in the range of 55% to 60%. Wall motion was normal; there were no regional wall motion abnormalities. - Mitral valve: There was mild regurgitation. - Right ventricle: The cavity size was mildly dilated. Wall thickness was normal. - Tricuspid valve: There was mild regurgitation. - Pulmonary arteries: PA peak pressure: 33 mm Hg (S).  ASSESSMENT & PLAN:  44 year old African-American female, premenopausal, without significant past medical history, presented with palpable right breast mass and right axillary mass.   1. 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 - I reviewed her breasts surgical pathology results with her in great detail ,  She has had fantastic partial response (near complete response) form 8 months of chemotherapy and antibody therapy.   The initial 6.3 cm breast mass has only 1 mm residual tumor,  And the previously 4.4 cm right axillary lymph node has had complete  Pathological response, all 7 nodes were negative for cancer. - given the excellent response to her chemotherapy,  And her 3 lung metastatic lesions were not hypermetabolic on the recent PET scan, I'll refer her to see radiation oncologist Dr. Valere Dross, who met  her before, to consider consolidation radiation to the breast and possible lung lesions. Dr. Valere Dross offered breast radiation, but no radiation to her lung lesions.  -continue herceptin indefinitely as long as her cancer is controlled. - I'll follow-up her PET scan every 3-4 months.  2. Nail changes -secondary to Taxol. -recovering well   3. G2 peripheral neuropathy, skin pigmentation, hands and feet -improving. We'll continue to monitor closely.  4. Anemia, secondary to chemotherapy -Resolved now, hemoglobin 12.9 today.  Plan -Continue Herceptin every 3 weeks, treatment today. -She'll continue breast irradiation. -I'll see her back in 3 weeks  I spent about 20 minutes counseling the patient and her family members, total care was about 25 minutes.  [name]  05/03/2015

## 2015-05-03 NOTE — Patient Instructions (Signed)

## 2015-05-03 NOTE — Patient Instructions (Signed)
[facility] Discharge Instructions for Patients Receiving Chemotherapy  Today you received the following chemotherapy agents: Herceptin.   If you develop nausea and vomiting that is not controlled by your nausea medication, call the clinic.   BELOW ARE SYMPTOMS THAT SHOULD BE REPORTED IMMEDIATELY:  *FEVER GREATER THAN 100.5 F  *CHILLS WITH OR WITHOUT FEVER  NAUSEA AND VOMITING THAT IS NOT CONTROLLED WITH YOUR NAUSEA MEDICATION  *UNUSUAL SHORTNESS OF BREATH  *UNUSUAL BRUISING OR BLEEDING  TENDERNESS IN MOUTH AND THROAT WITH OR WITHOUT PRESENCE OF ULCERS  *URINARY PROBLEMS  *BOWEL PROBLEMS  UNUSUAL RASH Items with * indicate a potential emergency and should be followed up as soon as possible.  Feel free to call the clinic should you have any questions or concerns. The clinic phone number is [phone].  Please show the [facility] at check-in to the Emergency Department and triage nurse.

## 2015-05-04 ENCOUNTER — Ambulatory Visit: Payer: BLUE CROSS/BLUE SHIELD | Admitting: Physical Therapy

## 2015-05-04 ENCOUNTER — Ambulatory Visit
Admission: RE | Admit: 2015-05-04 | Discharge: 2015-05-04 | Disposition: A | Discharge status: Home / Self Care | Payer: BC Managed Care – PPO | Source: Ambulatory Visit | Attending: Radiation Oncology | Admitting: Radiation Oncology

## 2015-05-04 DIAGNOSIS — Z510 Encounter for antineoplastic radiation therapy: Secondary | ICD-10-CM | POA: Diagnosis not present

## 2015-05-04 DIAGNOSIS — R609 Edema, unspecified: Secondary | ICD-10-CM

## 2015-05-04 DIAGNOSIS — M25611 Stiffness of right shoulder, not elsewhere classified: Secondary | ICD-10-CM

## 2015-05-04 DIAGNOSIS — Z9189 Other specified personal risk factors, not elsewhere classified: Secondary | ICD-10-CM | POA: Diagnosis not present

## 2015-05-04 NOTE — Therapy (Signed)
[facility], [place], [address] Phone: [phone]   Fax:  [phone]  Physical Therapy Treatment  Patient Details  Name: [name] [name] MRN: [id number] Date of Birth: Sep 22, 1970 Referring Provider:  [name], DO  Encounter Date: 05/04/2015      PT End of Session - 05/04/15 1710    Visit Number 2   Number of Visits 9   Date for PT Re-Evaluation 06/03/15   PT Start Time 8938   PT Stop Time 1657   PT Time Calculation (min) 44 min   Activity Tolerance Patient limited by pain   Behavior During Therapy [facility] for tasks assessed/performed      Past Medical History  Diagnosis Date  . Breast cancer   . Pneumonia     hx of pneumonia 08/2013   . GERD (gastroesophageal reflux disease)     during pregnancy   . Breast cancer   . Breast cancer, right breast 04/14/2015    Past Surgical History  Procedure Laterality Date  . Cesarean section    . Essure tubal ligation    . Cholecystectomy    . Wisdom tooth extraction    . Portacath placement Left 07/21/2014    Procedure: INSERTION PORT-A-CATH;  Surgeon: Stark Klein, MD;  Location: [facility];  Service: General;  Laterality: Left;  . Breast lumpectomy with radioactive seed and sentinel lymph node biopsy Right 03/29/2015    Procedure: BREAST LUMPECTOMY WITH RADIOACTIVE SEED AND SENTINEL LYMPH NODE BIOPSY;  Surgeon: Stark Klein, MD;  Location: [facility];  Service: General;  Laterality: Right;    There were no vitals filed for this visit.  Visit Diagnosis:  Postoperative edema  Stiffness of joint, shoulder region, right      Subjective Assessment - 05/04/15 1617    Subjective Nothing is new.   Currently in Pain? No/denies                         [facility] Adult PT Treatment/Exercise - 05/04/15 0001    Manual Therapy   Manual Therapy Passive ROM;Myofascial release;Soft tissue mobilization;Manual Lymphatic Drainage  (MLD)   Soft tissue mobilization right UE neural tension stretch to patient tolerance   Manual Lymphatic Drainage (MLD) In supine, short neck, left axilla and anterior interaxillary anastomosis, right groin and axillo-inguinal anastomosis,and area at superior chest;  In left sidelying, posterior interaxillary anastomosis right to left and right axillo-inguinal anastomosis, with extra time at right axilla.   Passive ROM left shoulder, in supine, for ER, flexion, abduction.  Had patient assume supine position with arms at 90 degrees abduction and neutral horizontal abduction, then hold x 30 seconds.  (She noted stretch at back of shoulder with this, on right only.)                PT Education - 05/04/15 1709    Education provided Yes   Education Details getting a compression sleeve (reviewed info from ABC class)   Person(s) Educated Patient   Methods Explanation   Comprehension Verbalized understanding                [facility] Goals - 05/04/15 1617    CC Long Term Goal  #1   Title Patient with verbalize an understanding of lymphedema risk reduction precautions   Status Achieved            Plan - 05/04/15 1710    Clinical Impression Statement Patient with  limited tolerance for right shoulder PROM today; she had some increased soreness after treatment, but felt that was reasonable.  Goal for knowledge of lymphedema risk reduction was met by patient attendign ABC class this week.   Pt will benefit from skilled therapeutic intervention in order to improve on the following deficits Impaired UE functional use;Decreased activity tolerance;Decreased knowledge of precautions;Impaired perceived functional ability;Decreased knowledge of use of DME;Decreased strength;Increased edema;Increased fascial restricitons   Rehab Potential Excellent   Clinical Impairments Affecting Rehab Potential months of chemo with some sensory impairment, starting radiation therapy    PT Frequency  2x / week   PT Duration 4 weeks   PT Treatment/Interventions Manual techniques;Manual lymph drainage   PT Next Visit Plan Right shoulder ROM; manual lymph drainage for right axilla and upper chest.   Consulted and Agree with Plan of Care Patient        Problem List Patient Active Problem List   Diagnosis Date Noted  . Diarrhea 04/29/2015  . Breast cancer, right breast 04/14/2015  . 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 Rash 08/17/2014  . Fever 08/04/2014  . Breast cancer 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 07/22/2014  . Family history of breast cancer 07/22/2014  . Lung mas bilaterial 07/22/2014    SALIS[place],DONNA 05/04/2015, 5:14 PM  [place], [place], [address] Phone: [phone]   Fax:  [facility], PT 05/04/2015 5:14 PM

## 2015-05-05 ENCOUNTER — Ambulatory Visit
Admission: RE | Admit: 2015-05-05 | Discharge: 2015-05-05 | Disposition: A | Discharge status: Home / Self Care | Payer: BC Managed Care – PPO | Source: Ambulatory Visit | Attending: Radiation Oncology | Admitting: Radiation Oncology

## 2015-05-05 DIAGNOSIS — Z510 Encounter for antineoplastic radiation therapy: Secondary | ICD-10-CM | POA: Diagnosis not present

## 2015-05-06 ENCOUNTER — Ambulatory Visit
Admission: RE | Admit: 2015-05-06 | Discharge: 2015-05-06 | Disposition: A | Discharge status: Home / Self Care | Payer: BC Managed Care – PPO | Source: Ambulatory Visit | Attending: Radiation Oncology | Admitting: Radiation Oncology

## 2015-05-06 ENCOUNTER — Ambulatory Visit: Payer: BLUE CROSS/BLUE SHIELD | Admitting: Physical Therapy

## 2015-05-06 DIAGNOSIS — Z9189 Other specified personal risk factors, not elsewhere classified: Secondary | ICD-10-CM | POA: Diagnosis not present

## 2015-05-06 DIAGNOSIS — R609 Edema, unspecified: Secondary | ICD-10-CM

## 2015-05-06 DIAGNOSIS — Z510 Encounter for antineoplastic radiation therapy: Secondary | ICD-10-CM | POA: Diagnosis not present

## 2015-05-06 DIAGNOSIS — M25611 Stiffness of right shoulder, not elsewhere classified: Secondary | ICD-10-CM

## 2015-05-06 DIAGNOSIS — C50311 Malignant neoplasm of lower-inner quadrant of right female breast: Secondary | ICD-10-CM

## 2015-05-06 NOTE — Therapy (Signed)
[place], [place], [address] Phone: [phone]   Fax:  [phone]  Physical Therapy Treatment  Patient Details  Name: [name] [name] MRN: [id number] Date of Birth: [birth year] Referring Provider:  Stark Klein, MD  Encounter Date: 05/06/2015      PT End of Session - 05/06/15 1109    Visit Number 3   Number of Visits 9   Date for PT Re-Evaluation 06/03/15   PT Start Time 1107   PT Stop Time 1145   PT Time Calculation (min) 38 min   Activity Tolerance Patient limited by pain   Behavior During Therapy [facility] for tasks assessed/performed      Past Medical History  Diagnosis Date  . Breast cancer   . Pneumonia     hx of pneumonia 08/2013   . GERD (gastroesophageal reflux disease)     during pregnancy   . Breast cancer   . Breast cancer, right breast 04/14/2015    Past Surgical History  Procedure Laterality Date  . Cesarean section    . Essure tubal ligation    . Cholecystectomy    . Wisdom tooth extraction    . Portacath placement Left 07/21/2014    Procedure: INSERTION PORT-A-CATH;  Surgeon: Stark Klein, MD;  Location: [facility];  Service: General;  Laterality: Left;  . Breast lumpectomy with radioactive seed and sentinel lymph node biopsy Right 03/29/2015    Procedure: BREAST LUMPECTOMY WITH RADIOACTIVE SEED AND SENTINEL LYMPH NODE BIOPSY;  Surgeon: Stark Klein, MD;  Location: [facility];  Service: General;  Laterality: Right;    There were no vitals filed for this visit.  Visit Diagnosis:  Stiffness of joint, shoulder region, right  At risk for lymphedema  Postoperative edema  Carcinoma of lower-inner quadrant of right breast      Subjective Assessment - 05/06/15 1109    Subjective No new complaints. Had radiation this am. No pain.   Currently in Pain? No/denies           [facility] Adult PT Treatment/Exercise - 05/06/15 0001    Manual Therapy   Soft tissue  mobilization right UE neural tension stretch to patient tolerance   Manual Lymphatic Drainage (MLD) In supine, short neck, left axilla and anterior interaxillary anastomosis, right groin and axillo-inguinal anastomosis,and area at superior chest;  In left sidelying, posterior interaxillary anastomosis right to left and right axillo-inguinal anastomosis, with extra time at right axilla.   Passive ROM left shoulder, in supine, flexion, abduction.       Exercises: With dowel rod - flexion x 10 reps  - abduction x 10 reps  Pulleys - X 2 minutes each way (flexion and abduction) with cues on posture and ex form.  pball at wall - rolling ball up/down wall with stretching at end ranges x 10 each for flexion and abduction.         [facility] Goals - 05/04/15 [place] Term Goal  #1   Title Patient with verbalize an understanding of lymphedema risk reduction precautions   Status Achieved            Plan - 05/06/15 1109    Clinical Impression Statement Pt making steady progress toward goals.   Pt will benefit from skilled therapeutic intervention in order to improve on the following deficits Impaired UE functional use;Decreased activity tolerance;Decreased knowledge of precautions;Impaired perceived functional ability;Decreased knowledge of use of DME;Decreased strength;Increased edema;Increased fascial restricitons  Rehab Potential Excellent   Clinical Impairments Affecting Rehab Potential months of chemo with some sensory impairment, starting radiation therapy    PT Frequency 2x / week   PT Duration 4 weeks   PT Treatment/Interventions Manual techniques;Manual lymph drainage   PT Next Visit Plan Right shoulder ROM; manual lymph drainage for right axilla and upper chest.   Consulted and Agree with Plan of Care Patient        Problem List Patient Active Problem List   Diagnosis Date Noted  . Diarrhea 04/29/2015  . Breast cancer, right breast 04/14/2015  . Anemia  in neoplastic disease 03/15/2015  . 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 Rash 08/17/2014  . Fever 08/04/2014  . Breast cancer 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 07/22/2014  . Family history of breast cancer 07/22/2014  . Lung mas bilaterial 07/22/2014    Willow Ora 05/07/2015, 12:55 AM  Willow Ora, PTA, [facility] [address], [place], [place] [address] [phone] 05/07/2015, 12:55 AM

## 2015-05-09 ENCOUNTER — Ambulatory Visit
Admission: RE | Admit: 2015-05-09 | Discharge: 2015-05-09 | Disposition: A | Discharge status: Home / Self Care | Payer: BLUE CROSS/BLUE SHIELD | Source: Ambulatory Visit | Attending: Radiation Oncology | Admitting: Radiation Oncology

## 2015-05-09 ENCOUNTER — Encounter: Payer: Self-pay | Admitting: Radiation Oncology

## 2015-05-09 ENCOUNTER — Ambulatory Visit
Admission: RE | Admit: 2015-05-09 | Discharge: 2015-05-09 | Disposition: A | Discharge status: Home / Self Care | Payer: BC Managed Care – PPO | Source: Ambulatory Visit | Attending: Radiation Oncology | Admitting: Radiation Oncology

## 2015-05-09 VITALS — BP 149/82 | HR 69 | Temp 98.3°F | Ht 70.0 in | Wt 211.7 lb

## 2015-05-09 DIAGNOSIS — C50911 Malignant neoplasm of unspecified site of right female breast: Secondary | ICD-10-CM

## 2015-05-09 DIAGNOSIS — Z510 Encounter for antineoplastic radiation therapy: Secondary | ICD-10-CM | POA: Diagnosis not present

## 2015-05-09 NOTE — Progress Notes (Signed)
[name] [name] has received 5 fractions to her right breast.  She reports tenderness in her nipple region and that her breast feels heavier since starting treatment.  Skin intact with any hyperpigmentation

## 2015-05-09 NOTE — Progress Notes (Signed)
   Weekly Management Note:  Outpatient    ICD-9-CM ICD-10-CM   1. Breast cancer, right breast 174.9 C50.911     Current Dose:  9 Gy  Projected Dose: 46.8 Gy   Narrative:  The patient presents for routine under treatment assessment.  CBCT/MVCT images/Port film x-rays were reviewed.  The chart was checked. Nipple soreness, internally.  Physical Findings:  height is '5\' 10"'$  (1.778 m) and weight is 211 lb 11.2 oz (96.026 kg). Her temperature is 98.3 F (36.8 C). Her blood pressure is 149/82 and her pulse is 69.   Wt Readings from Last 3 Encounters:  05/09/15 211 lb 11.2 oz (96.026 kg)  05/03/15 209 lb 12.8 oz (95.165 kg)  04/27/15 203 lb 6.4 oz (92.262 kg)   NAD, skin without changes over right breast  Impression:  The patient is tolerating radiotherapy.  Plan:  Continue radiotherapy as planned.    ________________________________   Eppie Gibson, M.D.

## 2015-05-10 ENCOUNTER — Ambulatory Visit
Admission: RE | Admit: 2015-05-10 | Discharge: 2015-05-10 | Disposition: A | Discharge status: Home / Self Care | Payer: BC Managed Care – PPO | Source: Ambulatory Visit | Attending: Radiation Oncology | Admitting: Radiation Oncology

## 2015-05-10 ENCOUNTER — Encounter: Payer: Self-pay | Admitting: *Deleted

## 2015-05-10 ENCOUNTER — Ambulatory Visit (HOSPITAL_BASED_OUTPATIENT_CLINIC_OR_DEPARTMENT_OTHER): Payer: BLUE CROSS/BLUE SHIELD

## 2015-05-10 ENCOUNTER — Ambulatory Visit: Payer: BLUE CROSS/BLUE SHIELD

## 2015-05-10 DIAGNOSIS — Z9189 Other specified personal risk factors, not elsewhere classified: Secondary | ICD-10-CM | POA: Diagnosis not present

## 2015-05-10 DIAGNOSIS — R110 Nausea: Secondary | ICD-10-CM

## 2015-05-10 DIAGNOSIS — R609 Edema, unspecified: Secondary | ICD-10-CM

## 2015-05-10 DIAGNOSIS — C50311 Malignant neoplasm of lower-inner quadrant of right female breast: Secondary | ICD-10-CM

## 2015-05-10 DIAGNOSIS — R531 Weakness: Secondary | ICD-10-CM | POA: Diagnosis not present

## 2015-05-10 DIAGNOSIS — Z510 Encounter for antineoplastic radiation therapy: Secondary | ICD-10-CM | POA: Diagnosis not present

## 2015-05-10 DIAGNOSIS — M25611 Stiffness of right shoulder, not elsewhere classified: Secondary | ICD-10-CM

## 2015-05-10 NOTE — Patient Instructions (Signed)

## 2015-05-10 NOTE — Progress Notes (Signed)
Report received from Val RN at approx 1630, pt reported to infusion stating she was very weak and Woozy. Reports that she has not had anything to eat since 830am and really has not been drinking enough. Per Val RN pt is to be monitored for 89mnutes and if she feels better and VS stable she can be discharged home. Pt given snacks and drinks.   1657 Pt states she feels ready to go, she feels better than she did when she first came in. She states she is not dizzy, "just feel weak and tired." pt and VS stable and pt discharged.

## 2015-05-10 NOTE — Progress Notes (Signed)
1630 pt also reporting Nausea. Which resolved prior to discharge. Pt used bathroom prior to d/c stating that she is going to taking an immodium because she feels as if she is getting "bubble guts". Pt states she feels okay and will call clinic with any questions or concerns.

## 2015-05-10 NOTE — Progress Notes (Signed)
At 1610 pt came into the chemo room - stated " I don't feel good"  Pt place in chair- alert and oriented x 3.  [name] [name] states she " had a radiation appointment a little while ago and then came up and got some cookies from [employer]- but then on my way out I just begin to feel a little light headed and kinda nauseous but I do not feel like I want to throw up"  Vital signs obtained.  Pt not clammy. States per inquiry that she last ate at " 830 this morning ".  Not hydrating well today due to going to lymphedema clinic and then this office.  Pt given ginger ale x 2 - cheese and crackers- pt became tearful.  Reassurance given to pt.  MD informed.  Requested to monitor pt post oral hydration and post eating- may d/c home if feeling bettter.  Pt rested in clinic-post hydration ( oral ) and given cheese and crackers.  At 1710 pt felt much better- spoke to several personnel and was laughing with staff.  Pt d/ced with verbalizing appreciation as well as understanding need to keep better hydrated and have protein snacks available if needed.  No other needs.

## 2015-05-10 NOTE — Therapy (Signed)
[place], [place], [address] Phone: [phone]   Fax:  [phone]  Physical Therapy Treatment  Patient Details  Name: [name] [name] MRN: [id number] Date of Birth: 09/17/1970 Referring Provider:  Stark Klein, MD  Encounter Date: 05/10/2015      PT End of Session - 05/10/15 0934    Visit Number 4   Number of Visits 9   Date for PT Re-Evaluation 06/03/15   PT Start Time 0853   PT Stop Time 0935   PT Time Calculation (min) 42 min   Activity Tolerance Patient limited by pain   Behavior During Therapy [facility] for tasks assessed/performed      Past Medical History  Diagnosis Date  . Breast cancer   . Pneumonia     hx of pneumonia 08/2013   . GERD (gastroesophageal reflux disease)     during pregnancy   . Breast cancer   . Breast cancer, right breast 04/14/2015    Past Surgical History  Procedure Laterality Date  . Cesarean section    . Essure tubal ligation    . Cholecystectomy    . Wisdom tooth extraction    . Portacath placement Left 07/21/2014    Procedure: INSERTION PORT-A-CATH;  Surgeon: Stark Klein, MD;  Location: [facility];  Service: General;  Laterality: Left;  . Breast lumpectomy with radioactive seed and sentinel lymph node biopsy Right 03/29/2015    Procedure: BREAST LUMPECTOMY WITH RADIOACTIVE SEED AND SENTINEL LYMPH NODE BIOPSY;  Surgeon: Stark Klein, MD;  Location: [facility];  Service: General;  Laterality: Right;    There were no vitals filed for this visit.  Visit Diagnosis:  At risk for lymphedema  Stiffness of joint, shoulder region, right  Postoperative edema  Carcinoma of lower-inner quadrant of right breast      Subjective Assessment - 05/10/15 0855    Subjective Started feeling the cording a few times down to my forearm and it was just in the morning but have started feeling it during the day too.    Currently in Pain? No/denies                          [facility] Adult PT Treatment/Exercise - 05/10/15 0001    Shoulder Exercises: Pulleys   Flexion 3 minutes   ABduction 1 minute   Shoulder Exercises: Therapy Ball   Flexion 10 reps   ABduction 5 reps   Manual Therapy   Soft tissue mobilization right UE neural tension stretch to patient tolerance   Manual Lymphatic Drainage (MLD) In supine, short neck, left axilla and anterior interaxillary anastomosis, right groin and axillo-inguinal anastomosis,and area at superior chest   Passive ROM Right shoulder, in supine, flexion, abduction.                  PT Education - 05/10/15 0934    Education provided Yes   Education Details Self manual lymph drainage   Person(s) Educated Patient   Methods Explanation;Demonstration;Verbal cues   Comprehension Verbalized understanding;Returned demonstration;Need further instruction                [facility] Goals - 05/04/15 1617    [facility] Long Term Goal  #1   Title Patient with verbalize an understanding of lymphedema risk reduction precautions   Status Achieved            Plan - 05/10/15 1027    Clinical Impression Statement  Pt did well with initial instruction of self manual lymph drianage and returned good demonstration. Reports feeling a little sore after treatment today at posterior shoulder but no increased pain. Pt is compliant with cane exercises.   Pt will benefit from skilled therapeutic intervention in order to improve on the following deficits Impaired UE functional use;Decreased activity tolerance;Decreased knowledge of precautions;Impaired perceived functional ability;Decreased knowledge of use of DME;Decreased strength;Increased edema;Increased fascial restricitons   Rehab Potential Excellent   Clinical Impairments Affecting Rehab Potential months of chemo with some sensory impairment, starting radiation therapy    PT Frequency 2x / week   PT Duration 4 weeks   PT  Treatment/Interventions Manual techniques;Manual lymph drainage   PT Next Visit Plan Right shoulder ROM; review and cont manual lymph drainage for right axilla and upper chest.   Consulted and Agree with Plan of Care Patient        Problem List Patient Active Problem List   Diagnosis Date Noted  . Diarrhea 04/29/2015  . Breast cancer, right breast 04/14/2015  . 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 Rash 08/17/2014  . Fever 08/04/2014  . Breast cancer 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 07/22/2014  . Family history of breast cancer 07/22/2014  . Lung mas bilaterial 07/22/2014    Otelia Limes, PTA 05/10/2015, 10:30 AM  [place], [place], [address] Phone: [phone]   Fax:  [phone]

## 2015-05-11 ENCOUNTER — Ambulatory Visit: Payer: BLUE CROSS/BLUE SHIELD

## 2015-05-11 ENCOUNTER — Ambulatory Visit
Admission: RE | Admit: 2015-05-11 | Discharge: 2015-05-11 | Disposition: A | Discharge status: Home / Self Care | Payer: BC Managed Care – PPO | Source: Ambulatory Visit | Attending: Radiation Oncology | Admitting: Radiation Oncology

## 2015-05-11 DIAGNOSIS — Z510 Encounter for antineoplastic radiation therapy: Secondary | ICD-10-CM | POA: Diagnosis not present

## 2015-05-12 ENCOUNTER — Ambulatory Visit (HOSPITAL_BASED_OUTPATIENT_CLINIC_OR_DEPARTMENT_OTHER): Payer: BLUE CROSS/BLUE SHIELD | Admitting: Nurse Practitioner

## 2015-05-12 ENCOUNTER — Ambulatory Visit: Payer: BLUE CROSS/BLUE SHIELD | Admitting: Physical Therapy

## 2015-05-12 ENCOUNTER — Ambulatory Visit
Admission: RE | Admit: 2015-05-12 | Discharge: 2015-05-12 | Disposition: A | Discharge status: Home / Self Care | Payer: BC Managed Care – PPO | Source: Ambulatory Visit | Attending: Radiation Oncology | Admitting: Radiation Oncology

## 2015-05-12 ENCOUNTER — Encounter: Payer: Self-pay | Admitting: Radiation Oncology

## 2015-05-12 ENCOUNTER — Other Ambulatory Visit: Payer: Self-pay | Admitting: *Deleted

## 2015-05-12 VITALS — BP 160/80 | HR 79 | Temp 98.7°F | Resp 18 | Ht 70.0 in | Wt 212.1 lb

## 2015-05-12 DIAGNOSIS — R609 Edema, unspecified: Secondary | ICD-10-CM

## 2015-05-12 DIAGNOSIS — B029 Zoster without complications: Secondary | ICD-10-CM | POA: Diagnosis not present

## 2015-05-12 DIAGNOSIS — C7800 Secondary malignant neoplasm of unspecified lung: Secondary | ICD-10-CM | POA: Diagnosis not present

## 2015-05-12 DIAGNOSIS — Z9189 Other specified personal risk factors, not elsewhere classified: Secondary | ICD-10-CM

## 2015-05-12 DIAGNOSIS — C50919 Malignant neoplasm of unspecified site of unspecified female breast: Secondary | ICD-10-CM | POA: Diagnosis not present

## 2015-05-12 DIAGNOSIS — Z510 Encounter for antineoplastic radiation therapy: Secondary | ICD-10-CM | POA: Diagnosis not present

## 2015-05-12 DIAGNOSIS — M25611 Stiffness of right shoulder, not elsewhere classified: Secondary | ICD-10-CM

## 2015-05-12 MED ORDER — VALACYCLOVIR HCL 1 G PO TABS
1000.0000 mg | ORAL_TABLET | Freq: Three times a day (TID) | ORAL | Status: DC
Start: 2015-05-12 — End: 2015-05-30

## 2015-05-12 NOTE — Patient Instructions (Signed)
Wear tg soft for a couple of hours later today and make sure top is folded down, not bunched and tight on upper arm.   Pay attention tomorrow and see if "pulling" in upper arm is decreased.   May wear tg soft for symptom management only.  Do not have to wear if arm is not bothersome

## 2015-05-12 NOTE — Progress Notes (Signed)
Retta Mac, NP  Clinical note:  [name] [name] is seen today to evaluate a new skin "rash" just inferior to the medial aspect of the left breast.  We are currently treating her right breast and regional lymph nodes.  She states that she noticed a few bumps yesterday but these multiplied by this morning.  There is no significant pain or burning sensation.  She did have some pruritus yesterday and this morning. On examination there were multiple small vesicular eruptions along the medial left T6 or T7 dermatome.  There were no other skin findings along these dermatomes. Impression: This could represent a very early shingles infection. Plan: We will have Retta Mac NP see her later this afternoon to see if she concurs.

## 2015-05-12 NOTE — Therapy (Signed)
[place], [place], [address] Phone: [phone]   Fax:  [phone]  Physical Therapy Treatment  Patient Details  Name: [name] [name] MRN: [id number] Date of Birth: 02-22-1971 Referring Provider:  Stark Klein, MD  Encounter Date: 05/12/2015      PT End of Session - 05/12/15 1422    Visit Number 5   Number of Visits 9   Date for PT Re-Evaluation 06/03/15   PT Start Time 1300   PT Stop Time 1345   PT Time Calculation (min) 45 min   Activity Tolerance Patient tolerated treatment well   Behavior During Therapy [facility] for tasks assessed/performed      Past Medical History  Diagnosis Date  . Breast cancer   . Pneumonia     hx of pneumonia 08/2013   . GERD (gastroesophageal reflux disease)     during pregnancy   . Breast cancer   . Breast cancer, right breast 04/14/2015    Past Surgical History  Procedure Laterality Date  . Cesarean section    . Essure tubal ligation    . Cholecystectomy    . Wisdom tooth extraction    . Portacath placement Left 07/21/2014    Procedure: INSERTION PORT-A-CATH;  Surgeon: Stark Klein, MD;  [facility]: [facility];  Service: General;  Laterality: Left;  . Breast lumpectomy with radioactive seed and sentinel lymph node biopsy Right 03/29/2015    Procedure: BREAST LUMPECTOMY WITH RADIOACTIVE SEED AND SENTINEL LYMPH NODE BIOPSY;  Surgeon: Stark Klein, MD;  Location: [facility];  Service: General;  Laterality: Right;    There were no vitals filed for this visit.  Visit Diagnosis:  At risk for lymphedema  Stiffness of joint, shoulder region, right  Postoperative edema      Subjective Assessment - 05/12/15 1310    Subjective Pulling down medial upper arm toward elbow when she puts her into radiation mold. pt tearful today as she has developed a rash on left abdomen and is fearful it might be shingles. 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  There is an area of concern in one of her lungs and she will be scheduled for a PET scan.  Has had chemo from 08/03/2014 to 03/15/2015, still doing antibody treatment. every 3 weeks,  had right umpectomy on 8/1/6/ 2016 with 7 lymph nodes removed and plans to start  radiation next week.    Currently in Pain? Yes   Pain Score 7    Pain Location Arm   Pain Orientation Right   Pain Descriptors / Indicators --  pulling                          [facility] Adult PT Treatment/Exercise - 05/12/15 0001    Self-Care   Other Self-Care Comments  provided small TG soft to right arm for pt to use symptomatically, provided prescription for class one compression sleeve and gauntlet for pt to take to MD for signature    Shoulder Exercises: Pulleys   Flexion 2 minutes   ABduction 2 minutes   Manual Therapy   Manual Lymphatic Drainage (MLD) In supine, short neck, left axilla and anterior interaxillary anastomosis, right groin and axillo-inguinal anastomosis,and area at superior chest   Passive ROM Right shoulder, in supine, flexion, abduction. with neural stretch at wrist  [facility] Goals - 05/04/15 [place] Term Goal  #1   Title Patient with verbalize an understanding of lymphedema risk reduction precautions   Status Achieved            Plan - 05/12/15 1427    Clinical Impression Statement No firmness or string-like cording felt in upper arm or axilla today. Pt toerated manual lymph drainage well  Issued tg soft for pt to use later today to see if some lymphatic suport with help ease pulling in upper arm in radiation position.    Clinical Impairments Affecting Rehab Potential months of chemo with some sensory impairment, starting radiation therapy    PT Next Visit Plan Right shoulder ROM; review and cont manual lymph  drainage for right axilla and upper chest. Assess effectiveness of tg soft Check for cording.    Consulted and Agree with Plan of Care Patient        Problem List Patient Active Problem List   Diagnosis Date Noted  . Diarrhea 04/29/2015  . Breast cancer, right breast 04/14/2015  . 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 Rash 08/17/2014  . Fever 08/04/2014  . Breast cancer 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 07/22/2014  . Family history of breast cancer 07/22/2014  . Lung mas bilaterial 07/22/2014   Donato Heinz. Owens Shark, PT  05/12/2015, 2:33 PM  [place], [place], [address] Phone: [phone]   Fax:  [phone]

## 2015-05-13 ENCOUNTER — Telehealth: Payer: Self-pay | Admitting: *Deleted

## 2015-05-13 ENCOUNTER — Ambulatory Visit
Admission: RE | Admit: 2015-05-13 | Discharge: 2015-05-13 | Disposition: A | Discharge status: Home / Self Care | Payer: BC Managed Care – PPO | Source: Ambulatory Visit | Attending: Radiation Oncology | Admitting: Radiation Oncology

## 2015-05-13 ENCOUNTER — Encounter: Payer: Self-pay | Admitting: Nurse Practitioner

## 2015-05-13 DIAGNOSIS — Z510 Encounter for antineoplastic radiation therapy: Secondary | ICD-10-CM | POA: Diagnosis not present

## 2015-05-13 NOTE — Telephone Encounter (Signed)
Spoke with patient to follow up after starting radiation.  She states she now has shingles but other than that she is doing ok.  She states she still has some nail issues but it is getting better.  Encouraged her to call with any needs or concerns.

## 2015-05-13 NOTE — Progress Notes (Signed)
SYMPTOM MANAGEMENT CLINIC   HPI: [name] [name] 44 y.o. female diagnosed with rest cancer; with lung metastasis.  Currently undergoing both Herceptin therapy and radiation treatments.  Pt developed rash to left upper abdomen .  Within the past 24 hours; and reports that the rash is slightly pruritic.  She denies any pain to the site.  Chills denies any recent fevers or chills.  HPI  ROS  Past Medical History  Diagnosis Date  . Breast cancer   . Pneumonia     hx of pneumonia 08/2013   . GERD (gastroesophageal reflux disease)     during pregnancy   . Breast cancer   . Breast cancer, right breast 04/14/2015    Past Surgical History  Procedure Laterality Date  . Cesarean section    . Essure tubal ligation    . Cholecystectomy    . Wisdom tooth extraction    . Portacath placement Left 07/21/2014    Procedure: INSERTION PORT-A-CATH;  Surgeon: Stark Klein, MD;  [facility]: [facility];  Service: General;  Laterality: Left;  . Breast lumpectomy with radioactive seed and sentinel lymph node biopsy Right 03/29/2015    Procedure: BREAST LUMPECTOMY WITH RADIOACTIVE SEED AND SENTINEL LYMPH NODE BIOPSY;  Surgeon: Stark Klein, MD;  Location: [place];  Service: General;  Laterality: Right;    has Breast cancer 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; Family history of breast cancer; Lung mas bilaterial; Fever; Rash; Anemia in neoplastic disease; Peripheral neuropathy due to chemotherapy; Breast cancer, right breast; Diarrhea; and Shingles on her problem list.    is allergic to adhesive; aspirin; and codeine.    Medication List       This list is accurate as of: 05/12/15 11:59 PM.  Always use your most recent med list.               cetirizine 10 MG tablet  Commonly known as:  ZYRTEC  Take 10 mg by mouth daily as needed for allergies (allergies).     clindamycin 1 % gel  Commonly known as:  CLINDAGEL  Apply topically to affected areas BID.     hyaluronate sodium Gel  Apply 1  application topically 2 (two) times daily.     hydrocortisone 2.5 % cream  APPLY TOPICALLY TO THE AFFECTED AREA TWICE DAILY     lidocaine-prilocaine cream  Commonly known as:  EMLA  Apply topically as needed.     loperamide 2 MG tablet  Commonly known as:  IMODIUM A-D  Take 1 tablet (2 mg total) by mouth 4 (four) times daily as needed for diarrhea or loose stools.     LORazepam 1 MG tablet  Commonly known as:  ATIVAN  Take 1 tablet (1 mg total) by mouth once.     non-metallic deodorant Misc  Commonly known as:  ALRA  Apply 1 application topically daily as needed.     NYQUIL PO  Take 30 mLs by mouth 2 (two) times daily as needed (cold/allergies).     ondansetron 8 MG tablet  Commonly known as:  ZOFRAN  Take 1 tablet (8 mg total) by mouth every 8 (eight) hours as needed for nausea or vomiting.     oxyCODONE-acetaminophen 5-325 MG tablet  Commonly known as:  ROXICET  Take 1-2 tablets by mouth every 4 (four) hours as needed for severe pain.     pantoprazole 20 MG tablet  Commonly known as:  PROTONIX  Take 1 tablet (20 mg total) by mouth daily.  valACYclovir 1000 MG tablet  Commonly known as:  VALTREX  Take 1 tablet (1,000 mg total) by mouth 3 (three) times daily.     VICKS VAPORUB EX  Apply 1 application topically at bedtime. Applies under nose, throat and on chest.         PHYSICAL EXAMINATION  Oncology Vitals 05/12/2015 05/10/2015 05/10/2015 05/09/2015 05/03/2015 04/27/2015 04/14/2015  Height 178 cm - - 178 cm 178 cm 178 cm 178 cm  Weight 96.208 kg - - 96.026 kg 95.165 kg 92.262 kg 93.396 kg  Weight (lbs) 212 lbs 2 oz - - 211 lbs 11 oz 209 lbs 13 oz 203 lbs 6 oz 205 lbs 14 oz  BMI (kg/m2) 30.43 kg/m2 - - 30.38 kg/m2 30.1 kg/m2 29.18 kg/m2 29.54 kg/m2  Temp 98.7 98.3 97.6 98.3 98.9 99.1 98.2  Pulse 79 73 79 69 75 88 85  Resp 18 18 18  - 20 18 -  SpO2 100 100 100 - 100 100 -  BSA (m2) 2.18 m2 - - 2.18 m2 2.17 m2 2.14 m2 2.15 m2   BP Readings from Last 3 Encounters:    05/12/15 160/80  05/10/15 161/74  05/09/15 149/82    Physical Exam  Constitutional: She is oriented to person, place, and time and well-developed, well-nourished, and in no distress.  HENT:  Head: Normocephalic and atraumatic.  Eyes: Conjunctivae and EOM are normal. Pupils are equal, round, and reactive to light. Right eye exhibits no discharge. Left eye exhibits no discharge. No scleral icterus.  Neck: Normal range of motion.  Pulmonary/Chest: Effort normal. No respiratory distress.  Musculoskeletal: Normal range of motion.  Neurological: She is alert and oriented to person, place, and time. Gait normal.  Skin: Skin is warm and dry. Rash noted. No erythema. No pallor.  Patient has a rash to her left upper abdomen.  Directly below her bra line with tiny pustules forming.  There is no surrounding edema, erythema, warmth, tenderness, or red streaks.  Psychiatric: Affect normal.  Nursing note and vitals reviewed.   LABORATORY DATA:. No visits with results within 3 Day(s) from this visit. Latest known visit with results is:  Appointment on 05/03/2015  Component Date Value Ref Range Status  . WBC 05/03/2015 5.4  3.9 - 10.3 10e3/uL Final  . NEUT# 05/03/2015 2.9  1.5 - 6.5 10e3/uL Final  . HGB 05/03/2015 11.1* 11.6 - 15.9 g/dL Final  . HCT 05/03/2015 33.8* 34.8 - 46.6 % Final  . Platelets 05/03/2015 240  145 - 400 10e3/uL Final  . MCV 05/03/2015 86.5  79.5 - 101.0 fL Final  . MCH 05/03/2015 28.5  25.1 - 34.0 pg Final  . MCHC 05/03/2015 32.9  31.5 - 36.0 g/dL Final  . RBC 05/03/2015 3.91  3.70 - 5.45 10e6/uL Final  . RDW 05/03/2015 14.1  11.2 - 14.5 % Final  . lymph# 05/03/2015 1.8  0.9 - 3.3 10e3/uL Final  . MONO# 05/03/2015 0.4  0.1 - 0.9 10e3/uL Final  . Eosinophils Absolute 05/03/2015 0.3  0.0 - 0.5 10e3/uL Final  . Basophils Absolute 05/03/2015 0.0  0.0 - 0.1 10e3/uL Final  . NEUT% 05/03/2015 53.6  38.4 - 76.8 % Final  . LYMPH% 05/03/2015 32.7  14.0 - 49.7 % Final  . MONO%  05/03/2015 6.8  0.0 - 14.0 % Final  . EOS% 05/03/2015 6.4  0.0 - 7.0 % Final  . BASO% 05/03/2015 0.5  0.0 - 2.0 % Final  . Sodium 05/03/2015 142  136 - 145 mEq/L Final  .  Potassium 05/03/2015 3.5  3.5 - 5.1 mEq/L Final  . Chloride 05/03/2015 108  98 - 109 mEq/L Final  . CO2 05/03/2015 27  22 - 29 mEq/L Final  . Glucose 05/03/2015 105  70 - 140 mg/dl Final   Glucose reference range is for nonfasting patients. Fasting glucose reference range is 70- 100.  Marland Kitchen BUN 05/03/2015 8.5  7.0 - 26.0 mg/dL Final  . Creatinine 05/03/2015 0.8  0.6 - 1.1 mg/dL Final  . Total Bilirubin 05/03/2015 0.31  0.20 - 1.20 mg/dL Final  . Alkaline Phosphatase 05/03/2015 57  40 - 150 U/L Final  . AST 05/03/2015 22  5 - 34 U/L Final  . ALT 05/03/2015 19  0 - 55 U/L Final  . Total Protein 05/03/2015 6.7  6.4 - 8.3 g/dL Final  . Albumin 05/03/2015 3.4* 3.5 - 5.0 g/dL Final  . Calcium 05/03/2015 9.1  8.4 - 10.4 mg/dL Final  . Anion Gap 05/03/2015 7  3 - 11 mEq/L Final  . EGFR 05/03/2015 >90  >90 ml/min/1.73 m2 Final   eGFR is calculated using the CKD-EPI Creatinine Equation (2009)   Left abdomen:     RADIOGRAPHIC STUDIES: No results found.  ASSESSMENT/PLAN:    Shingles Pt developed rash to left upper abdomen .  Within the past 24 hours; and reports that the rash is slightly pruritic.  She denies any pain to the site.  Chills denies any recent fevers or chills.  On exam.-It does appear the patient has the initial stages of a shingles rash.  The rash does not cross the midline.  The rash does not appear tender; and has no surrounding erythema, edema, warmth, or red streaks.  Will prescribe valacyclovir for treatment of the shingles.  Confirmed that patient has oxycodone to take at home if needed for shingles pain.  Also advised patient to try keeping her bra off of the actual rash if at all possible.    Breast cancer 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 Patient continues to receive Herceptin on an every three-week basis and  radiation treatments on a daily basis.  Patient's last Herceptin infusion was 05/03/2015.  Patient is scheduled to return on 05/24/2015 for labs, visit, and her next cycle of Herceptin.  Patient stated understanding of all instructions; and was in agreement with this plan of care. The patient knows to call the clinic with any problems, questions or concerns.   Review/collaboration with Dr. Burr Medico and Dr. Valere Dross regarding all aspects of patient's visit today.   Total time spent with patient was 25 minutes;  with greater than 75 percent of that time spent in face to face counseling regarding patient's symptoms,  and coordination of care and follow up.  Disclaimer:This dictation was prepared with Dragon/digital dictation along with [employer]. Any transcriptional errors that result from this process are unintentional.  Drue Second, NP 05/13/2015

## 2015-05-13 NOTE — Assessment & Plan Note (Signed)
Patient continues to receive Herceptin on an every three-week basis and radiation treatments on a daily basis.  Patient's last Herceptin infusion was 05/03/2015.  Patient is scheduled to return on 05/24/2015 for labs, visit, and her next cycle of Herceptin.

## 2015-05-13 NOTE — Assessment & Plan Note (Addendum)
Pt developed rash to left upper abdomen .  Within the past 24 hours; and reports that the rash is slightly pruritic.  She denies any pain to the site.  Chills denies any recent fevers or chills.  On exam.-It does appear the patient has the initial stages of a shingles rash.  The rash does not cross the midline.  The rash does not appear tender; and has no surrounding erythema, edema, warmth, or red streaks.  Will prescribe valacyclovir for treatment of the shingles.  Confirmed that patient has oxycodone to take at home if needed for shingles pain.  Also advised patient to try keeping her bra off of the actual rash if at all possible.

## 2015-05-16 ENCOUNTER — Ambulatory Visit
Admission: RE | Admit: 2015-05-16 | Discharge: 2015-05-16 | Disposition: A | Discharge status: Home / Self Care | Payer: BC Managed Care – PPO | Source: Ambulatory Visit | Attending: Radiation Oncology | Admitting: Radiation Oncology

## 2015-05-16 ENCOUNTER — Ambulatory Visit
Admission: RE | Admit: 2015-05-16 | Discharge: 2015-05-16 | Disposition: A | Discharge status: Home / Self Care | Payer: BLUE CROSS/BLUE SHIELD | Source: Ambulatory Visit | Attending: Radiation Oncology | Admitting: Radiation Oncology

## 2015-05-16 ENCOUNTER — Ambulatory Visit: Payer: BLUE CROSS/BLUE SHIELD | Attending: General Surgery

## 2015-05-16 ENCOUNTER — Telehealth: Payer: Self-pay | Admitting: Hematology

## 2015-05-16 ENCOUNTER — Telehealth: Payer: Self-pay | Admitting: *Deleted

## 2015-05-16 VITALS — BP 153/83 | HR 78 | Temp 98.4°F | Wt 209.5 lb

## 2015-05-16 DIAGNOSIS — Z9189 Other specified personal risk factors, not elsewhere classified: Secondary | ICD-10-CM | POA: Diagnosis present

## 2015-05-16 DIAGNOSIS — R609 Edema, unspecified: Secondary | ICD-10-CM | POA: Diagnosis present

## 2015-05-16 DIAGNOSIS — C50311 Malignant neoplasm of lower-inner quadrant of right female breast: Secondary | ICD-10-CM | POA: Diagnosis present

## 2015-05-16 DIAGNOSIS — Z510 Encounter for antineoplastic radiation therapy: Secondary | ICD-10-CM | POA: Diagnosis not present

## 2015-05-16 DIAGNOSIS — M25611 Stiffness of right shoulder, not elsewhere classified: Secondary | ICD-10-CM | POA: Insufficient documentation

## 2015-05-16 NOTE — Telephone Encounter (Signed)
pt cld to see if echo had been sch-sent Vaughan Basta email to pre-cert and adv pt wiill call after reply to sch ad will call her w.time & date

## 2015-05-16 NOTE — Therapy (Signed)
[place], [place], [address] Phone: [phone]   Fax:  [phone]  Physical Therapy Treatment  Patient Details  Name: [name] [name] MRN: [id number] Date of Birth: January 30, 1971 Referring Provider:  [name], DO  Encounter Date: 05/16/2015      PT End of Session - 05/16/15 1022    Visit Number 6   Number of Visits 9   Date for PT Re-Evaluation 06/03/15   PT Start Time 0939   PT Stop Time 1021   PT Time Calculation (min) 42 min   Activity Tolerance Patient tolerated treatment well   Behavior During Therapy [facility] for tasks assessed/performed      Past Medical History  Diagnosis Date  . Breast cancer   . Pneumonia     hx of pneumonia 08/2013   . GERD (gastroesophageal reflux disease)     during pregnancy   . Breast cancer   . Breast cancer, right breast 04/14/2015    Past Surgical History  Procedure Laterality Date  . Cesarean section    . Essure tubal ligation    . Cholecystectomy    . Wisdom tooth extraction    . Portacath placement Left 07/21/2014    Procedure: INSERTION PORT-A-CATH;  Surgeon: Stark Klein, MD;  Location: [facility];  Service: General;  Laterality: Left;  . Breast lumpectomy with radioactive seed and sentinel lymph node biopsy Right 03/29/2015    Procedure: BREAST LUMPECTOMY WITH RADIOACTIVE SEED AND SENTINEL LYMPH NODE BIOPSY;  Surgeon: Stark Klein, MD;  Location: [place];  Service: General;  Laterality: Right;    There were no vitals filed for this visit.  Visit Diagnosis:  At risk for lymphedema  Stiffness of joint, shoulder region, right  Postoperative edema  Carcinoma of lower-inner quadrant of right breast [facility])      Subjective Assessment - 05/16/15 0944    Subjective I do have shingles, I started the medicine on Thursday. I almost cancelled just from being so uncomfortable but I wanted to come. My back is bothering me from the  shingles, not hurting, just can feel it.   Currently in Pain? No/denies                         [facility] Adult PT Treatment/Exercise - 05/16/15 0001    Manual Therapy   Manual Lymphatic Drainage (MLD) In supine, short neck, left axilla and anterior interaxillary anastomosis, right groin and axillo-inguinal anastomosis,and area at superior chest   Passive ROM Right shoulder, in supine, flexion, abduction. with neural stretch at wrist                         [place] - 05/04/15 1617    CC Long Term Goal  #1   Title Patient with verbalize an understanding of lymphedema risk reduction precautions   Status Achieved            Plan - 05/16/15 1024    Clinical Impression Statement Pt wasn't able to tolerate the TG soft at this time. She tried sleeping it but reported extreme itching and had to remove it after an hour. Possibly due ot having shingles right now which has also seemed to flare up her eczema, pt will try to wear it again in a few days. Tolerated stretching well and reports feeling good after treatment. Wore gloves as a precaution wiht pt having active shingles.  Pt will benefit from skilled therapeutic intervention in order to improve on the following deficits Impaired UE functional use;Decreased activity tolerance;Decreased knowledge of precautions;Impaired perceived functional ability;Decreased knowledge of use of DME;Decreased strength;Increased edema;Increased fascial restricitons   Rehab Potential Excellent   Clinical Impairments Affecting Rehab Potential months of chemo with some sensory impairment, starting radiation therapy    PT Frequency 2x / week   PT Duration 4 weeks   PT Treatment/Interventions Manual techniques;Manual lymph drainage   PT Next Visit Plan Assess goals and cont right shoulder ROM; review and cont manual lymph drainage for right axilla and upper chest.   Consulted and Agree with Plan of Care Patient         Problem List Patient Active Problem List   Diagnosis Date Noted  . Shingles 05/12/2015  . Diarrhea 04/29/2015  . Breast cancer, right breast ([place]) 04/14/2015  . 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 Peripheral neuropathy due to chemotherapy ([place]) 03/15/2015  . Rash 08/17/2014  . Fever 08/04/2014  . Breast cancer 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 ([place]) 07/22/2014  . Family history of breast cancer 07/22/2014  . Lung mas bilaterial 07/22/2014    Otelia Limes, PTA 05/16/2015, 10:28 AM  [place], [place], [address] Phone: [phone]   Fax:  [phone]

## 2015-05-16 NOTE — Progress Notes (Signed)
BP 153/83 mmHg  Pulse 78  Temp(Src) 98.4 F (36.9 C)  Wt 209 lb 8 oz (95.029 kg)  Weekly assessment of radiation to right [employer].ch 10 of 26 to breast and SCLAV.Mild discoloration.Visible itching shingles below left inner breast.Taking valtrex.

## 2015-05-16 NOTE — Progress Notes (Signed)
Weekly Management Note:  Site: Right breast/regional lymph nodes Current Dose:  1800  cGy Projected Dose: 4680  cGy  Narrative: The patient is seen today for routine under treatment assessment. CBCT/MVCT images/port films were reviewed. The chart was reviewed.   She is without complaints today.  She was seen by Retta Mac NP and felt to have shingles so she was placed on Valtrex.  Her lesions are beginning to crust.  She states that she did begin to develop discomfort along her left chest on Friday.  This has resolved without development of new vesicles.  Physical Examination:  Filed Vitals:   05/16/15 1158  BP: 153/83  Pulse: 78  Temp: 98.4 F (36.9 C)  .  Weight: 209 lb 8 oz (95.029 kg).  There is minimal hyperpigmentation the skin along the right breast.  On inspection of her vesicles just inferior to the medial left breast there appears to be crusting of the vesicles.  There are no new lesions.  Impression: Tolerating radiation therapy well.  Plan: Continue radiation therapy as planned.

## 2015-05-16 NOTE — Telephone Encounter (Signed)
Lm to check status following shingles dx on 05/12/15.

## 2015-05-17 ENCOUNTER — Telehealth: Payer: Self-pay | Admitting: *Deleted

## 2015-05-17 ENCOUNTER — Ambulatory Visit
Admission: RE | Admit: 2015-05-17 | Discharge: 2015-05-17 | Disposition: A | Discharge status: Home / Self Care | Payer: BC Managed Care – PPO | Source: Ambulatory Visit | Attending: Radiation Oncology | Admitting: Radiation Oncology

## 2015-05-17 DIAGNOSIS — Z510 Encounter for antineoplastic radiation therapy: Secondary | ICD-10-CM | POA: Diagnosis not present

## 2015-05-17 NOTE — Telephone Encounter (Signed)
Faxed FMLA paperwork to [employer], [place]

## 2015-05-18 ENCOUNTER — Encounter: Payer: Self-pay | Admitting: Radiation Oncology

## 2015-05-18 ENCOUNTER — Ambulatory Visit
Admission: RE | Admit: 2015-05-18 | Discharge: 2015-05-18 | Disposition: A | Discharge status: Home / Self Care | Payer: BC Managed Care – PPO | Source: Ambulatory Visit | Attending: Radiation Oncology | Admitting: Radiation Oncology

## 2015-05-18 ENCOUNTER — Telehealth: Payer: Self-pay | Admitting: Hematology

## 2015-05-18 ENCOUNTER — Ambulatory Visit: Payer: BLUE CROSS/BLUE SHIELD | Admitting: Physical Therapy

## 2015-05-18 DIAGNOSIS — M25611 Stiffness of right shoulder, not elsewhere classified: Secondary | ICD-10-CM

## 2015-05-18 DIAGNOSIS — Z9189 Other specified personal risk factors, not elsewhere classified: Secondary | ICD-10-CM | POA: Diagnosis not present

## 2015-05-18 DIAGNOSIS — R609 Edema, unspecified: Secondary | ICD-10-CM

## 2015-05-18 DIAGNOSIS — Z510 Encounter for antineoplastic radiation therapy: Secondary | ICD-10-CM | POA: Diagnosis not present

## 2015-05-18 NOTE — Telephone Encounter (Signed)
per pof to sch pt ECHO-per Linda NPR-cld pt and adv of time/date/location-pt understood

## 2015-05-18 NOTE — Progress Notes (Signed)
Forms completed and returned to patient, copies scanned

## 2015-05-18 NOTE — Therapy (Signed)
[place], [place], [address] Phone: [phone]   Fax:  [phone]  Physical Therapy Treatment  Patient Details  Name: [name] [name] MRN: [id number] Date of Birth: 1970-11-18 Referring Provider:  Stark Klein, MD  Encounter Date: 05/18/2015      PT End of Session - 05/18/15 1257    Visit Number 7   Number of Visits 9   Date for PT Re-Evaluation 06/03/15   PT Start Time 9767   PT Stop Time 1100   PT Time Calculation (min) 45 min   Activity Tolerance Patient tolerated treatment well   Behavior During Therapy [facility] for tasks assessed/performed      Past Medical History  Diagnosis Date  . Breast cancer   . Pneumonia     hx of pneumonia 08/2013   . GERD (gastroesophageal reflux disease)     during pregnancy   . Breast cancer   . Breast cancer, right breast 04/14/2015    Past Surgical History  Procedure Laterality Date  . Cesarean section    . Essure tubal ligation    . Cholecystectomy    . Wisdom tooth extraction    . Portacath placement Left 07/21/2014    Procedure: INSERTION PORT-A-CATH;  Surgeon: Stark Klein, MD;  [facility];  Service: General;  Laterality: Left;  . Breast lumpectomy with radioactive seed and sentinel lymph node biopsy Right 03/29/2015    Procedure: BREAST LUMPECTOMY WITH RADIOACTIVE SEED AND SENTINEL LYMPH NODE BIOPSY;  Surgeon: Stark Klein, MD;  Location: [place];  Service: General;  Laterality: Right;    There were no vitals filed for this visit.  Visit Diagnosis:  At risk for lymphedema  Stiffness of joint, shoulder region, right  Postoperative edema      Subjective Assessment - 05/18/15 1028    Subjective "this too shall pass" The fatigue is starting to set in.    Currently in Pain? No/denies  she says her shingles are starting to scab over a bit             [facility] PT Assessment - 05/18/15 0001    AROM   Right Shoulder  Flexion 145 Degrees   Right Shoulder ABduction 149 Degrees                     [facility] Adult PT Treatment/Exercise - 05/18/15 0001    Exercises   Other Exercises  standing knee flexion with core activation, hip abduction, heel raises for pt to do intermittently at home to help with LE strength    Shoulder Exercises: Supine   Other Supine Exercises dowel rod flexion and abduction  exercise   Shoulder Exercises: Sidelying   Other Sidelying Exercises shoulder flexion    Other Sidelying Exercises small circles in both directions    Manual Therapy   Passive ROM Right shoulder, in supine, flexion, abduction. with neural stretch at wrist also did external rotation in sidelying,                         [facility] Goals - 05/18/15 1029    CC Long Term Goal  #1   Title Patient with verbalize an understanding of lymphedema risk reduction precautions   Status Achieved            Plan - 05/18/15 1258    Clinical Impression Statement Pt is undergoing radiation treatment and is able to get into  positoin without difficulty.  She is beginning to have fatigue with radiation, so after discussion, we decided to postpone the strengthening portion of her program til after radiation.  She will return to PT the second week in November to focus on strengthening to prepare for return to work and learn the [employer] program to decrease her risk for lymphedema   Pt will benefit from skilled therapeutic intervention in order to improve on the following deficits Impaired UE functional use;Decreased activity tolerance;Decreased knowledge of precautions;Impaired perceived functional ability;Decreased knowledge of use of DME;Decreased strength;Increased edema;Increased fascial restricitons   Rehab Potential Excellent   Clinical Impairments Affecting Rehab Potential months of chemo with some sensory impairment,  radiation therapy    PT Next Visit Plan Reassess and renew after  radiation is complete and upgrade to strenthening program    PT Home Exercise Plan Eventual Strength ABC program althouth she may have to do it without resistance during radiation.   Consulted and Agree with Plan of Care Patient        Problem List Patient Active Problem List   Diagnosis Date Noted  . Shingles 05/12/2015  . Diarrhea 04/29/2015  . Breast cancer, right breast ([place]) 04/14/2015  . 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 Peripheral neuropathy due to chemotherapy ([place]) 03/15/2015  . Rash 08/17/2014  . Fever 08/04/2014  . Breast cancer 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 ([place]) 07/22/2014  . Family history of breast cancer 07/22/2014  . Lung mas bilaterial 07/22/2014   Donato Heinz. Owens Shark, PT    05/18/2015, 1:03 PM  [place], [place], [address] Phone: [phone]   Fax:  [phone]

## 2015-05-19 ENCOUNTER — Ambulatory Visit
Admission: RE | Admit: 2015-05-19 | Discharge: 2015-05-19 | Disposition: A | Discharge status: Home / Self Care | Payer: BC Managed Care – PPO | Source: Ambulatory Visit | Attending: Radiation Oncology | Admitting: Radiation Oncology

## 2015-05-19 DIAGNOSIS — Z510 Encounter for antineoplastic radiation therapy: Secondary | ICD-10-CM | POA: Diagnosis not present

## 2015-05-20 ENCOUNTER — Ambulatory Visit
Admission: RE | Admit: 2015-05-20 | Discharge: 2015-05-20 | Disposition: A | Discharge status: Home / Self Care | Payer: BC Managed Care – PPO | Source: Ambulatory Visit | Attending: Radiation Oncology | Admitting: Radiation Oncology

## 2015-05-20 ENCOUNTER — Ambulatory Visit (HOSPITAL_COMMUNITY): Payer: BLUE CROSS/BLUE SHIELD

## 2015-05-20 ENCOUNTER — Other Ambulatory Visit: Payer: Self-pay | Admitting: *Deleted

## 2015-05-20 ENCOUNTER — Ambulatory Visit (HOSPITAL_COMMUNITY): Admission: RE | Admit: 2015-05-20 | Payer: BLUE CROSS/BLUE SHIELD | Source: Ambulatory Visit

## 2015-05-20 DIAGNOSIS — Z510 Encounter for antineoplastic radiation therapy: Secondary | ICD-10-CM | POA: Diagnosis not present

## 2015-05-23 ENCOUNTER — Ambulatory Visit
Admission: RE | Admit: 2015-05-23 | Discharge: 2015-05-23 | Disposition: A | Discharge status: Home / Self Care | Payer: BC Managed Care – PPO | Source: Ambulatory Visit | Attending: Radiation Oncology | Admitting: Radiation Oncology

## 2015-05-23 ENCOUNTER — Ambulatory Visit: Payer: BLUE CROSS/BLUE SHIELD | Admitting: Physical Therapy

## 2015-05-23 ENCOUNTER — Ambulatory Visit
Admission: RE | Admit: 2015-05-23 | Discharge: 2015-05-23 | Disposition: A | Discharge status: Home / Self Care | Payer: BLUE CROSS/BLUE SHIELD | Source: Ambulatory Visit | Attending: Radiation Oncology | Admitting: Radiation Oncology

## 2015-05-23 ENCOUNTER — Encounter: Payer: Self-pay | Admitting: Radiation Oncology

## 2015-05-23 VITALS — BP 123/69 | HR 73 | Temp 98.4°F | Resp 20 | Wt 207.5 lb

## 2015-05-23 DIAGNOSIS — C50911 Malignant neoplasm of unspecified site of right female breast: Secondary | ICD-10-CM

## 2015-05-23 DIAGNOSIS — Z510 Encounter for antineoplastic radiation therapy: Secondary | ICD-10-CM | POA: Diagnosis not present

## 2015-05-23 NOTE — Progress Notes (Signed)
Weekly Management Note:  Site: right breast and regional lymph nodes  Current Dose:   2700  cGy Projected Dose:  4680  cGy  Narrative: The patient is seen today for routine under treatment assessment. CBCT/MVCT images/port films were reviewed. The chart was reviewed.    She is doing reasonably well although she does have moderate fatigue. She completes her shingles medication tomorrow. She uses Radioplex gel.  Physical Examination:  Filed Vitals:   05/23/15 0934  BP: 123/69  Pulse: 73  Temp: 98.4 F (36.9 C)  Resp: 20  .  Weight: 207 lb 8 oz (94.121 kg).  On inspection of the left chest her shingle lesions are now crusted and there are no new lesions. On inspection the right breast there is hyperpigmentation the skin, particularly along the inframammary region. No areas of desquamation.  Impression: Tolerating radiation therapy well.  Plan: Continue radiation therapy as planned.

## 2015-05-23 NOTE — Progress Notes (Signed)
Weekly rad txs right b completed, mild erythema ,skin intact, using radiaplex bid, appetite good, energy level waning,  Is resting   9:38 AM BP 123/69 mmHg  Pulse 73  Temp(Src) 98.4 F (36.9 C) (Oral)  Resp 20  Wt 207 lb 8 oz (94.121 kg)  Wt Readings from Last 3 Encounters:  05/23/15 207 lb 8 oz (94.121 kg)  05/16/15 209 lb 8 oz (95.029 kg)  05/12/15 212 lb 1.6 oz (96.208 kg)

## 2015-05-24 ENCOUNTER — Other Ambulatory Visit (HOSPITAL_BASED_OUTPATIENT_CLINIC_OR_DEPARTMENT_OTHER): Payer: BLUE CROSS/BLUE SHIELD

## 2015-05-24 ENCOUNTER — Ambulatory Visit (HOSPITAL_BASED_OUTPATIENT_CLINIC_OR_DEPARTMENT_OTHER): Payer: BLUE CROSS/BLUE SHIELD

## 2015-05-24 ENCOUNTER — Ambulatory Visit: Payer: BLUE CROSS/BLUE SHIELD

## 2015-05-24 ENCOUNTER — Ambulatory Visit (HOSPITAL_BASED_OUTPATIENT_CLINIC_OR_DEPARTMENT_OTHER): Payer: BLUE CROSS/BLUE SHIELD | Admitting: Hematology

## 2015-05-24 ENCOUNTER — Encounter: Payer: Self-pay | Admitting: Hematology

## 2015-05-24 ENCOUNTER — Ambulatory Visit
Admission: RE | Admit: 2015-05-24 | Discharge: 2015-05-24 | Disposition: A | Discharge status: Home / Self Care | Payer: BC Managed Care – PPO | Source: Ambulatory Visit | Attending: Radiation Oncology | Admitting: Radiation Oncology

## 2015-05-24 VITALS — BP 127/73 | HR 84 | Temp 98.7°F | Resp 18 | Ht 70.0 in | Wt 208.5 lb

## 2015-05-24 DIAGNOSIS — G620 Drug-induced polyneuropathy: Secondary | ICD-10-CM | POA: Diagnosis not present

## 2015-05-24 DIAGNOSIS — C7800 Secondary malignant neoplasm of unspecified lung: Principal | ICD-10-CM

## 2015-05-24 DIAGNOSIS — Z5112 Encounter for antineoplastic immunotherapy: Secondary | ICD-10-CM | POA: Diagnosis not present

## 2015-05-24 DIAGNOSIS — Z95828 Presence of other vascular implants and grafts: Secondary | ICD-10-CM

## 2015-05-24 DIAGNOSIS — C50311 Malignant neoplasm of lower-inner quadrant of right female breast: Secondary | ICD-10-CM | POA: Diagnosis not present

## 2015-05-24 DIAGNOSIS — C7802 Secondary malignant neoplasm of left lung: Secondary | ICD-10-CM

## 2015-05-24 DIAGNOSIS — C50911 Malignant neoplasm of unspecified site of right female breast: Secondary | ICD-10-CM

## 2015-05-24 DIAGNOSIS — Z23 Encounter for immunization: Secondary | ICD-10-CM | POA: Diagnosis not present

## 2015-05-24 DIAGNOSIS — C7801 Secondary malignant neoplasm of right lung: Secondary | ICD-10-CM

## 2015-05-24 DIAGNOSIS — Z510 Encounter for antineoplastic radiation therapy: Secondary | ICD-10-CM | POA: Diagnosis not present

## 2015-05-24 LAB — CBC WITH DIFFERENTIAL/PLATELET
BASO%: 0.4 % (ref 0.0–2.0)
BASOS ABS: 0 10*3/uL (ref 0.0–0.1)
EOS%: 2.4 % (ref 0.0–7.0)
Eosinophils Absolute: 0.1 10*3/uL (ref 0.0–0.5)
HCT: 36.7 % (ref 34.8–46.6)
HGB: 11.9 g/dL (ref 11.6–15.9)
LYMPH%: 22.2 % (ref 14.0–49.7)
MCH: 28.1 pg (ref 25.1–34.0)
MCHC: 32.5 g/dL (ref 31.5–36.0)
MCV: 86.3 fL (ref 79.5–101.0)
MONO#: 0.6 10*3/uL (ref 0.1–0.9)
MONO%: 10.8 % (ref 0.0–14.0)
NEUT#: 3.7 10*3/uL (ref 1.5–6.5)
NEUT%: 64.2 % (ref 38.4–76.8)
Platelets: 236 10*3/uL (ref 145–400)
RBC: 4.26 10*6/uL (ref 3.70–5.45)
RDW: 14.7 % — AB (ref 11.2–14.5)
WBC: 5.8 10*3/uL (ref 3.9–10.3)
lymph#: 1.3 10*3/uL (ref 0.9–3.3)

## 2015-05-24 LAB — COMPREHENSIVE METABOLIC PANEL (CC13)
ALT: 15 U/L (ref 0–55)
AST: 18 U/L (ref 5–34)
Albumin: 3.8 g/dL (ref 3.5–5.0)
Alkaline Phosphatase: 69 U/L (ref 40–150)
Anion Gap: 8 mEq/L (ref 3–11)
BUN: 11 mg/dL (ref 7.0–26.0)
CHLORIDE: 106 meq/L (ref 98–109)
CO2: 25 mEq/L (ref 22–29)
Calcium: 9.4 mg/dL (ref 8.4–10.4)
Creatinine: 0.7 mg/dL (ref 0.6–1.1)
EGFR: >90 mL/min/{1.73_m2} (ref >90)
GLUCOSE: 74 mg/dL (ref 70–140)
POTASSIUM: 4.2 meq/L (ref 3.5–5.1)
SODIUM: 139 meq/L (ref 136–145)
Total Bilirubin: 0.47 mg/dL (ref 0.20–1.20)
Total Protein: 7.6 g/dL (ref 6.4–8.3)

## 2015-05-24 MED ORDER — ACETAMINOPHEN 325 MG PO TABS
650.0000 mg | ORAL_TABLET | Freq: Once | ORAL | Status: AC
  Administered 2015-05-24: 650 mg via ORAL

## 2015-05-24 MED ORDER — INFLUENZA VAC SPLIT QUAD 0.5 ML IM SUSY
0.5000 mL | PREFILLED_SYRINGE | Freq: Once | INTRAMUSCULAR | Status: AC
  Administered 2015-05-24: 0.5 mL via INTRAMUSCULAR
  Filled 2015-05-24: qty 0.5

## 2015-05-24 MED ORDER — DIPHENHYDRAMINE HCL 25 MG PO CAPS
50.0000 mg | ORAL_CAPSULE | Freq: Once | ORAL | Status: AC
  Administered 2015-05-24: 50 mg via ORAL

## 2015-05-24 MED ORDER — SODIUM CHLORIDE 0.9 % IJ SOLN
10.0000 mL | INTRAMUSCULAR | Status: DC | PRN
  Administered 2015-05-24: 10 mL via INTRAVENOUS
  Filled 2015-05-24: qty 10

## 2015-05-24 MED ORDER — SODIUM CHLORIDE 0.9 % IJ SOLN
10.0000 mL | INTRAMUSCULAR | Status: DC | PRN
  Administered 2015-05-24: 10 mL
  Filled 2015-05-24: qty 10

## 2015-05-24 MED ORDER — DIPHENHYDRAMINE HCL 25 MG PO CAPS
ORAL_CAPSULE | ORAL | Status: AC
  Filled 2015-05-24: qty 2

## 2015-05-24 MED ORDER — TRASTUZUMAB CHEMO INJECTION 440 MG
6.0000 mg/kg | Freq: Once | INTRAVENOUS | Status: AC
  Administered 2015-05-24: 567 mg via INTRAVENOUS
  Filled 2015-05-24: qty 27

## 2015-05-24 MED ORDER — SODIUM CHLORIDE 0.9 % IV SOLN
Freq: Once | INTRAVENOUS | Status: AC
  Administered 2015-05-24: 16:00:00 via INTRAVENOUS

## 2015-05-24 MED ORDER — HEPARIN SOD (PORK) LOCK FLUSH 100 UNIT/ML IV SOLN
500.0000 [IU] | Freq: Once | INTRAVENOUS | Status: AC | PRN
Start: 2015-05-24 — End: 2015-05-24
  Administered 2015-05-24: 500 [IU]
  Filled 2015-05-24: qty 5

## 2015-05-24 MED ORDER — ACETAMINOPHEN 325 MG PO TABS
ORAL_TABLET | ORAL | Status: AC
  Filled 2015-05-24: qty 2

## 2015-05-24 NOTE — Patient Instructions (Signed)
[facility] Discharge Instructions for Patients Receiving Chemotherapy  Today you received the following chemotherapy agents:  Herceptin  To help prevent nausea and vomiting after your treatment, we encourage you to take your nausea medication as prescribed.   If you develop nausea and vomiting that is not controlled by your nausea medication, call the clinic.   BELOW ARE SYMPTOMS THAT SHOULD BE REPORTED IMMEDIATELY:  *FEVER GREATER THAN 100.5 F  *CHILLS WITH OR WITHOUT FEVER  NAUSEA AND VOMITING THAT IS NOT CONTROLLED WITH YOUR NAUSEA MEDICATION  *UNUSUAL SHORTNESS OF BREATH  *UNUSUAL BRUISING OR BLEEDING  TENDERNESS IN MOUTH AND THROAT WITH OR WITHOUT PRESENCE OF ULCERS  *URINARY PROBLEMS  *BOWEL PROBLEMS  UNUSUAL RASH Items with * indicate a potential emergency and should be followed up as soon as possible.  Feel free to call the clinic you have any questions or concerns. The clinic phone number is [phone].  Please show the CHEMO ALERT CARD at check-in to the Emergency Department and triage nurse.   

## 2015-05-24 NOTE — Patient Instructions (Signed)

## 2015-05-24 NOTE — Progress Notes (Signed)
[facility] Follow up note   Patient Care Team: [name], DO as PCP - General (Family Medicine) [name], DO (Family Medicine) Holley Bouche, NP as Nurse Practitioner (Nurse Practitioner) Stark Klein, MD as Consulting Physician (General Surgery) Arloa Koh, MD as Consulting Physician (Radiation Oncology) [name], MD as Consulting Physician (Hematology)  CHIEF COMPLAINTS Follow up metastatic breast cancer  Oncology History   Breast cancer 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   Staging form: Breast, AJCC 7th Edition     Clinical stage from 07/22/2014: Stage IV (T3, N1, M1) - Unsigned       Breast cancer 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 ([place])   07/02/2014 Mammogram Mammogram showed a 2cm right beast mass and a 1.8cm right axillary node. MRI breast on 07/16/2014 showed 7cm R breast lesion and 4.4cm r axillary node    07/02/2014 Imaging CT CAP: a 4.7cm mass in LUL lung and a 2.1cm mas in RML, and a small nodule in RUL, suspecious for metastasis     07/09/2014 Initial Diagnosis right IDA with b/l lung lesions, ER-/PR-/HER2+   07/09/2014 Initial Biopsy US guided right breast mass and axillary node biopsy showed IDA, and DCIS, ER-/PR-/HER2+   07/26/2014 Pathologic Stage Left lung mass by IR, path revealed high grade carcinoma, morphology similar to breast tumor biopsy, TTF(-), NapsinA(-), ER(-)   08/04/2014 - 03/22/2015 Chemotherapy weekly Paclitaxel 48m/m2, trastuzumab and pertuzumab every 3 weeks   10/04/2014 Imaging Interval decrease in the right axillary lymphadenopathy. Bilateral pulmonary lesions with left hilar lymphadenopathy also markedly decreased in the interval. The left hilar lymphadenopathy has resolved.   12/20/2014 Imaging restaging CT showed stable disease, no new lesions    03/29/2015 Pathology Results  right breast lumpectomy showed  chemotherapy treatment effect,  a 1 mm residual tumor,   margins were widely negative, 5 sentinel lymph nodes and 2  axillary lymph nodes were negative.    03/29/2015 Surgery  right breast lumpectomy and sentinel lymph node biopsy  by Dr. BBarry Dienes  04/12/2015 -  Chemotherapy  Herceptin maintenance therapy , 6 mg/kg, every 3 weeks   05/03/2015 -  Radiation Therapy right breast adjuvant irradiation by Dr. MValere Dross    CURRENT THERAPY: Breast irradiation, started on 05/03/2015. Herceptin maintenance therapy every 3 weeks, started on 04/12/2015  INTERVAL HISTROY   NShakiyahreturns for follow-up.  She has completed half of her planned breast irradiation. She is tolerating treatment well, except mild to moderate fatigue. She developed shingle at the epigastric area about 2 weeks ago, was seen by our symptom management clinic and treated with valacyclovir. He has healed completely. She feels well overall, had occasional diarrhea, which is controlled by Imodium. She denies any pain, nausea, dyspnea, or other new symptoms. She feels her lump above her right breast incision site is getting bigger daily, she is concerned.  MEDICAL HISTORY:  Past Medical History  Diagnosis Date  . [place]   . Pneumonia     hx of pneumonia 08/2013   . GERD (gastroesophageal reflux disease)     during pregnancy   . [place]   . [place] 04[place]    SURGICAL HISTORY: Past Surgical History  Procedure Laterality Date  . Cesarean section    . Essure tubal ligation    . Cholecystectomy    . Wisdom tooth extraction    . Portacath placement Left 07/21/2014    Procedure: INSERTION PORT-A-CATH;  Surgeon: FStark Klein MD;  [facility];  Service: General;  Laterality: Left;  . Breast lumpectomy with radioactive seed and sentinel lymph node biopsy Right 03/29/2015    Procedure: BREAST LUMPECTOMY WITH RADIOACTIVE SEED AND SENTINEL LYMPH NODE BIOPSY;  Surgeon: Stark Klein, MD;  Location: [facility];  Service: General;  Laterality: Right;    SOCIAL HISTORY: History   Social  History  . Marital Status: Married    Spouse Name: N/A    Number of Children:  she has 2 daughters at the age of 65 and 35.   . Years of Education: N/A   Occupational History  .  works as Freight forwarder for a [employer].    Social History Main Topics  . Smoking status: Never Smoker   . Smokeless tobacco: Not on file  . Alcohol Use: Yes  . Drug Use: No  . Sexual Activity: No    FAMILY HISTORY: Family History  Problem Relation Age of Onset  . Breast cancer Mother 12  . Liver cancer Father 73  . Breast cancer Maternal Aunt 36  . Prostate cancer Maternal Grandfather   . Liver cancer Paternal Grandmother   . Prostate cancer Paternal Grandfather      GENETICS: 08/30/2014 BreastNext panel was negative. 17 genes including BRCA1, BRCA2, were negative for mutations.   ALLERGIES:  is allergic to adhesive; aspirin; and codeine.  MEDICATIONS:  Current Outpatient Prescriptions  Medication Sig Dispense Refill  . Camphor-Eucalyptus-Menthol (VICKS VAPORUB EX) Apply 1 application topically at bedtime. Applies under nose, throat and on chest.    . cetirizine (ZYRTEC) 10 MG tablet Take 10 mg by mouth daily as needed for allergies (allergies).     . clindamycin (CLINDAGEL) 1 % gel Apply topically to affected areas BID. 60 g 3  . hyaluronate sodium (RADIAPLEXRX) GEL Apply 1 application topically 2 (two) times daily.    . hydrocortisone 2.5 % cream APPLY TOPICALLY TO THE AFFECTED AREA TWICE DAILY 454 g 0  . lidocaine-prilocaine (EMLA) cream Apply topically as needed. 30 g 2  . loperamide (IMODIUM A-D) 2 MG tablet Take 1 tablet (2 mg total) by mouth 4 (four) times daily as needed for diarrhea or loose stools. 30 tablet 0  . LORazepam (ATIVAN) 1 MG tablet Take 1 tablet (1 mg total) by mouth once. 2 tablet 0  . non-metallic deodorant (ALRA) MISC Apply 1 application topically daily as needed.    . ondansetron (ZOFRAN) 8 MG tablet Take 1 tablet (8 mg total) by mouth every 8 (eight) hours as needed  for nausea or vomiting. 30 tablet 3  . oxyCODONE-acetaminophen (ROXICET) 5-325 MG per tablet Take 1-2 tablets by mouth every 4 (four) hours as needed for severe pain. 30 tablet 0  . pantoprazole (PROTONIX) 20 MG tablet Take 1 tablet (20 mg total) by mouth daily. 30 tablet 3  . Pseudoeph-Doxylamine-DM-APAP (NYQUIL PO) Take 30 mLs by mouth 2 (two) times daily as needed (cold/allergies).     . valACYclovir (VALTREX) 1000 MG tablet Take 1 tablet (1,000 mg total) by mouth 3 (three) times daily. 30 tablet 0   Current Facility-Administered Medications  Medication Dose Route Frequency Provider Last Rate Last Dose  . Influenza vac split quadrivalent PF (FLUARIX) injection 0.5 mL  0.5 mL Intramuscular Once [name], MD       Facility-Administered Medications Ordered in Other Visits  Medication Dose Route Frequency Provider Last Rate Last Dose  . sodium chloride 0.9 % injection 10 mL  10 mL Intravenous PRN [name], MD  10 mL at 05/24/15 1324    REVIEW OF SYSTEMS:   Constitutional: Denies fevers, chills or abnormal night sweats, (+) malaise  Eyes: Denies blurriness of vision, double vision or watery eyes Ears, nose, mouth, throat, and face: Denies mucositis or sore throat Respiratory: (+) productive cough, no dyspnea or wheezes Cardiovascular: Denies palpitation, chest discomfort or lower extremity swelling Gastrointestinal:  Denies nausea, heartburn or change in bowel habits Skin:(+) rashes  Lymphatics: Denies new lymphadenopathy or easy bruising Neurological:Denies numbness, tingling or new weaknesses Behavioral/Psych: Mood is stable, no new changes  All other systems were reviewed with the patient and are negative.  PHYSICAL EXAMINATION: ECOG PERFORMANCE STATUS: 1 BP 127/73 mmHg  Pulse 84  Temp(Src) 98.7 F (37.1 C) (Oral)  Resp 18  Ht _0  (1.778 m)  Wt 208 lb 8 oz (94.575 kg)  BMI 29.92 kg/m2  SpO2 100% GENERAL:alert, no distress and comfortable SKIN: skin color, texture, turgor  are normal, no rashes or significant lesions EYES: normal, conjunctiva are pink and non-injected, sclera clear OROPHARYNX:no exudate, no erythema and lips, buccal mucosa, and tongue normal  NECK: supple, thyroid normal size, non-tender, without nodularity LYMPH:  no palpable lymphadenopathy in the cervical, axillary or inguinal LUNGS: clear to auscultation and percussion with normal breathing effort HEART: regular rate & rhythm and no murmurs and no lower extremity edema ABDOMEN:abdomen soft, non-tender and normal bowel sounds Musculoskeletal:no cyanosis of digits and no clubbing  PSYCH: alert & oriented x 3 with fluent speech NEURO: no focal motor/sensory deficits Breasts:  Status post right breast lumpectomy and sentinel lymph node biopsy, incisions have healed well. There is a 6cm lump above her breast incision site, non-tender. (+) Mild skin erythema and pigmentation of right breast ductal negative radiation. Breast inspection showed them to be symmetrical with no skin change or nipple discharge. Palpation of bilateral breasts  and axilla revealed no other obvious mass that I could appreciate. Skin: (+) Skin pigmentation on hands and feet, lighter than before. No skin rashes. (+)  pigmentation on fingernails, and bulging of fingernails and thickening of soft tissue under nailbed. There is a few healed skin lesion in the upper mid abdomen.  LABORATORY DATA:  I have reviewed the data as listed CBC Latest Ref Rng 05/24/2015 05/03/2015 04/27/2015  WBC 3.9 - 10.3 10e3/uL 5.8 5.4 7.0  Hemoglobin 11.6 - 15.9 g/dL 11.9 11.1(L) 12.9  Hematocrit 34.8 - 46.6 % 36.7 33.8(L) 39.4  Platelets 145 - 400 10e3/uL 236 240 268    CMP Latest Ref Rng 05/24/2015 05/03/2015 04/27/2015  Glucose 70 - 140 mg/dl 74 105 94  BUN 7.0 - 26.0 mg/dL 11.0 8.5 15.6  Creatinine 0.6 - 1.1 mg/dL 0.7 0.8 1.0  Sodium 136 - 145 mEq/L 139 142 139  Potassium 3.5 - 5.1 mEq/L 4.2 3.5 3.8  Chloride 96 - 112 mmol/L - - -  CO2 22 - 29  mEq/L _1 Calcium 8.4 - 10.4 mg/dL 9.4 9.1 9.8  Total Protein 6.4 - 8.3 g/dL 7.6 6.7 7.9  Total Bilirubin 0.20 - 1.20 mg/dL 0.47 0.31 0.55  Alkaline Phos 40 - 150 U/L 69 57 72  AST 5 - 34 U/L _2 ALT 0 - 55 U/L _3 PATHOLOGY REPORT 07/09/2014 #1 breast, right needle core biopsy more anterior -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 45)  Lung, biopsy, Left 07/26/2014 - HIGH GRADE CARCINOMA, SEE COMMENT. Microscopic Comment The carcinoma demonstrates the following immunophenotype: TTF-1 - negative expression. Napsin A- negative expression. CK5/6 - focal, moderate expression. estrogen receptor - negative expression. GCDFP- negative expression. The recent breast biopsy demonstrating Her2 amplified high grade invasive mammary carcinoma is noted ([id number]). Although the immunophenotype of the current case is non-sepcific, it strongly argues against primary lung adenocarcinoma. However, on re-review, the morphology of the current case is essentially identical to the primary mammary carcinoma. In lieu of further immunophenotyping, the tumor will be submitted for Her2 testing and the remaining tissue will be reserved for additional ancillary tumor testing. The results of the Her2 testing will be reported in an addendum. The case was discussed with Dr Burr Medico on 07/28/2015 and 07/29/2015 HER2 POSITIVE   Diagnosis 03/29/2015 1. Breast, lumpectomy, Right - INVASIVE DUCTAL CARCINOMA, SEE COMMENT. - SEE TUMOR SYNOPTIC TEMPLATE BELOW. 2. Lymph node, sentinel, biopsy, Right axillary #1 - ONE LYMPH NODE, NEGATIVE FOR TUMOR (0/1). - SEE COMMENT. 3. Lymph node, sentinel, biopsy, Right axillary #2 - BENIGN FIBROFATTY SOFT TISSUE. - NEGATIVE FOR ATYPIA OR  MALIGNANCY. - NEGATIVE FOR LYMPH NODE. 4. Lymph node, sentinel, biopsy, Right axillary #3 - ONE LYMPH NODE, NEGATIVE FOR TUMOR (0/1). - SEE COMMENT. 5. Lymph node, sentinel, biopsy, Right axillary #4 - ONE LYMPH NODE, NEGATIVE FOR TUMOR (0/1). - SEE COMMENT. 6. Lymph node, sentinel, biopsy, Right axillary #5 - ONE LYMPH NODE, NEGATIVE FOR TUMOR (0/1). - SEE COMMENT. 7. Lymph node, sentinel, biopsy, right axillary - ONE LYMPH NODE, NEGATIVE FOR TUMOR (0/1). - SEE COMMENT. 8. Lymph node, sentinel, biopsy, right axillary - ONE LYMPH NODE, NEGATIVE FOR TUMOR (0/1). - SEE COMMENT. 1 of 4 Amended copy Amended FINAL for [name] [name], [name] [name] ([id number]) Microscopic Comment 1. BREAST, INVASIVE TUMOR, WITH LYMPH NODES PRESENT Specimen, including laterality and lymph node sampling (sentinel, non-sentinel): Right breast with sentinel and non-sentinel lymph node sampling Procedure: Lumpectomy Histologic type: Ductal, see comment Grade: 2 of 3, see comment. Tubule formation: 3 Nuclear pleomorphism: 3 Mitotic: 1 Tumor size (glass slide measurement): 1 mm, see comment Margins: Invasive, distance to closest margin: See comment In-situ, distance to closest margin: N/A If margin positive, focally or broadly: N/A Lymphovascular invasion: Absent Ductal carcinoma in situ: Absent Grade: N/A Extensive intraductal component: N/A Lobular neoplasia: Absent Tumor focality: Unifocal, see comment Treatment effect: Present If present, treatment effect in breast tissue, lymph nodes or both: Both breast tissue and lymph nodes Extent of tumor: Skin: N/A Nipple: N/A Skeletal muscle: N/A Lymph nodes: Examined: 5 Sentinel 2 Non-sentinel 7 Total Lymph nodes with metastasis: 0 Isolated tumor cells (< 0.2 mm): N/A Micrometastasis: (> 0.2 mm and < 2.0 mm): N/A Macrometastasis: (> 2.0 mm): N/A Extracapsular extension: N/A Breast prognostic profile: Estrogen receptor: Not repeated, previous study  demonstrated 0% positivity ([id number]). Progesterone receptor: Not repeated, previous study demonstrated 0% positivity ([id number]) Her 2 neu: Demonstrates amplificaition ([id number]) Ki-67: Not repeated, previous study demonstrate 79% proliferation rate ([id number]). Non-neoplastic breast: Neoadjuvant related tissue changes and adenosis. TNM: ypT82m, ypN0, pMX Comments: Numerous representative sections including the 2.0 cm hemorrhagic tissue associated with X-shaped biopsy clip, the 2.0 cm ill defined lesion associated with M-shaped clip, and intervening tissue were submitted for review. Slide sections from the tissue surrounding the X-shaped and M-shaped clips demonstrate tissue changes consistent with neoadjuvant related change. There is no invasive or in situ carcinom present at  either site. However, in a representative section from the intervening tissue (slide G), there is a 1 mm focus of high grade invasive ductal carcinoma present. Given the minute size of the tumor, tumor grading is limited. The tumor is present within non-marginal tissue sections and is considered to be at least 0.5 cm from the nearest margin (medial).   Note: Amendment issued for modification of synoptic table, inadvertent typographical error. The change in the synoptic table was discussed with Dr Burr Medico on 04/07/2015. (CRR:ecj 04/07/2015) 2. , 4-8. In parts 2 and 4, there is extensive neoadjuvant related tissue changes including abundant foamy macrophages, fibroinflammatory reaction and dystrophic calcifications. The neoadjuvant tissue changes extend into perinodal soft tissue and are either focally or broadly present at the cauterized edge of the tissue submitted. There are no definitive features of malignancy present.  RADIOGRAPHIC STUDIES: CT CAP 10/04/2014 IMPRESSION: Interval decrease in the right axillary lymphadenopathy.  Bilateral pulmonary lesions with left hilar lymphadenopathy also markedly  decreased in the interval. The left hilar lymphadenopathy has resolved.  3 millimeter nonobstructing right renal stone.   CT chest, abdomen and pelvis with contrast 12/20/2014 IMPRESSION: 1. Today's study is very similar to the prior examination from 10/04/2014, demonstrating stable size of metastatic lesions in the right middle lobe and left upper lobe, and no new evidence of metastatic disease in the thorax. 2. 2.0 x 0.9 cm lesion in the medial aspect of the right breast is similar to prior examination. No residual right axillary lymphadenopathy. 3. As with prior examinations, there is no evidence of metastatic disease to the abdomen or pelvis. 4. 3 mm nonobstructive calculus in the lower pole collecting system of the right kidney.  PET scan 02/21/2015  IMPRESSION: 1. No evidence of metabolically active breast cancer metastasis. 2. Stable parenchymal thickening in the upper lobes at site of prior pulmonary masses. These stable pulmonary parenchymal thicken does not have associated metabolic activity.   ECHO 02/10/2015  Study Conclusions  - Left ventricle: LV longitudinal strain was -18.3% The cavity size was normal. Systolic function was normal. The estimated ejection fraction was in the range of 55% to 60%. Wall motion was normal; there were no regional wall motion abnormalities. - Mitral valve: There was mild regurgitation. - Right ventricle: The cavity size was mildly dilated. Wall thickness was normal. - Tricuspid valve: There was mild regurgitation. - Pulmonary arteries: PA peak pressure: 33 mm Hg (S).  ASSESSMENT & PLAN:  44 year old African-American female, premenopausal, without significant past medical history, presented with palpable right breast mass and right axillary mass.   1. 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 - I reviewed her breasts surgical pathology results with her in great detail ,  She has had fantastic partial response (near complete response) form 8 months of chemotherapy and antibody therapy.   The initial 6.3 cm breast mass has only 1 mm residual tumor,  And the previously 4.4 cm right axillary lymph node has had complete  Pathological response, all 7 nodes were negative for cancer. - given the excellent response to her chemotherapy,  And her 3 lung metastatic lesions were not hypermetabolic on the recent PET scan, I'll refer her to see radiation oncologist Dr. Valere Dross, who met her before, to consider consolidation radiation to the breast and possible lung lesions. Dr. Valere Dross offered  breast radiation, but no radiation to her lung lesions.  -continue herceptin indefinitely as long as her cancer is controlled. - I'll follow-up her PET scan every 3-4 months.  2. Right breast mass at incision site -This is likely surgical change, could be a seroma or scar from her recent surgery -We discussed with her surgeon Dr. Barry Dienes, we will order mammo and ultrasound of her right breast for further evaluation.  3. G2 peripheral neuropathy, skin pigmentation, hands and feet -improving. We'll continue to monitor closely.  4. Recent shingle infection in upper abdomen -Resolved now.  Plan -Continue Herceptin every 3 weeks, treatment today. -She'll continue breast irradiation. -I'll see her back in 3 weeks -echo before next cycle -Right breast ultrasound and mammogram -flu shot today   I spent about 20 minutes counseling the patient and her family members, total care was about 25 minutes.  [name]  05/24/2015

## 2015-05-24 NOTE — Progress Notes (Signed)
Pt scheduled for labs via port. Pt accessed with no difficulties. Port flushes great, approx 8cc of blood return noted for waste. Port would not give back any more blood. Multiple manipulation attempts with only 1-2cc of blood return at a time. Pt was sent back to lab for peripheral lab draw today. Called and informed Tanya, RN in infusion. Pt arrived to Dr. Burr Medico.

## 2015-05-25 ENCOUNTER — Telehealth: Payer: Self-pay | Admitting: Hematology

## 2015-05-25 ENCOUNTER — Ambulatory Visit
Admission: RE | Admit: 2015-05-25 | Discharge: 2015-05-25 | Disposition: A | Discharge status: Home / Self Care | Payer: BC Managed Care – PPO | Source: Ambulatory Visit | Attending: Radiation Oncology | Admitting: Radiation Oncology

## 2015-05-25 ENCOUNTER — Ambulatory Visit: Payer: BLUE CROSS/BLUE SHIELD

## 2015-05-25 DIAGNOSIS — Z510 Encounter for antineoplastic radiation therapy: Secondary | ICD-10-CM | POA: Diagnosis not present

## 2015-05-25 NOTE — Telephone Encounter (Signed)
per pof to sch pt appt-cld & spoke to pt & gave pt time & date /location of [place] & mamma and appt 11/1-pt understood-stated needed to r/s [facility] pt [phone] to clal and r/s @ a better time for her

## 2015-05-26 ENCOUNTER — Ambulatory Visit
Admission: RE | Admit: 2015-05-26 | Discharge: 2015-05-26 | Disposition: A | Discharge status: Home / Self Care | Payer: BC Managed Care – PPO | Source: Ambulatory Visit | Attending: Radiation Oncology | Admitting: Radiation Oncology

## 2015-05-26 DIAGNOSIS — Z510 Encounter for antineoplastic radiation therapy: Secondary | ICD-10-CM | POA: Diagnosis not present

## 2015-05-27 ENCOUNTER — Ambulatory Visit
Admission: RE | Admit: 2015-05-27 | Discharge: 2015-05-27 | Disposition: A | Discharge status: Home / Self Care | Payer: BC Managed Care – PPO | Source: Ambulatory Visit | Attending: Radiation Oncology | Admitting: Radiation Oncology

## 2015-05-27 DIAGNOSIS — Z510 Encounter for antineoplastic radiation therapy: Secondary | ICD-10-CM | POA: Diagnosis not present

## 2015-05-30 ENCOUNTER — Ambulatory Visit
Admission: RE | Admit: 2015-05-30 | Discharge: 2015-05-30 | Disposition: A | Discharge status: Home / Self Care | Payer: BC Managed Care – PPO | Source: Ambulatory Visit | Attending: Radiation Oncology | Admitting: Radiation Oncology

## 2015-05-30 ENCOUNTER — Encounter: Payer: Self-pay | Admitting: Radiation Oncology

## 2015-05-30 ENCOUNTER — Ambulatory Visit
Admission: RE | Admit: 2015-05-30 | Discharge: 2015-05-30 | Disposition: A | Discharge status: Home / Self Care | Payer: BLUE CROSS/BLUE SHIELD | Source: Ambulatory Visit | Attending: Radiation Oncology | Admitting: Radiation Oncology

## 2015-05-30 VITALS — BP 128/74 | HR 70 | Temp 98.0°F | Ht 70.0 in | Wt 207.1 lb

## 2015-05-30 DIAGNOSIS — C50311 Malignant neoplasm of lower-inner quadrant of right female breast: Secondary | ICD-10-CM

## 2015-05-30 DIAGNOSIS — Z510 Encounter for antineoplastic radiation therapy: Secondary | ICD-10-CM | POA: Diagnosis not present

## 2015-05-30 NOTE — Progress Notes (Addendum)
[name] [name] has received 0 fractions to her right breast.  Her skin remains intact, and note hyperpigmentation in the field.  Shingles has resolved and she is not longer taking valtrex.  She reports tingling,shooting pains in the area of the prior shingles and of her right breast.

## 2015-05-30 NOTE — Progress Notes (Signed)
Weekly Management Note:  Site: Right breast/regional lymph nodes Current Dose:  3600  cGy Projected Dose: 4680  cGy  Narrative: The patient is seen today for routine under treatment assessment. CBCT/MVCT images/port films were reviewed. The chart was reviewed.   She developed an episode of severe right lateral chest discomfort over the weekend for which she took and oxycodone/APAP.  She has been having moderate fatigue.  Physical Examination:  Filed Vitals:   05/30/15 0924  BP: 128/74  Pulse: 70  Temp: 98 F (36.7 C)  .  Weight: 207 lb 1.6 oz (93.94 kg).  There is hyperpigmentation the skin along the right breast, axilla and clavicular region as expected.  No areas of desquamation.  No evidence for progression of shingles along her left chest.  Impression: Tolerating radiation therapy well, except for possible right chest wall muscle spasm.  Plan: Continue radiation therapy as planned.

## 2015-05-31 ENCOUNTER — Encounter: Payer: Self-pay | Admitting: Radiation Oncology

## 2015-05-31 ENCOUNTER — Ambulatory Visit
Admission: RE | Admit: 2015-05-31 | Discharge: 2015-05-31 | Disposition: A | Discharge status: Home / Self Care | Payer: BC Managed Care – PPO | Source: Ambulatory Visit | Attending: Radiation Oncology | Admitting: Radiation Oncology

## 2015-05-31 DIAGNOSIS — Z510 Encounter for antineoplastic radiation therapy: Secondary | ICD-10-CM | POA: Diagnosis not present

## 2015-05-31 NOTE — Progress Notes (Signed)
Complex simulation note: The patient underwent virtual simulation for her right breast boost.  She is set up to a 4 field technique with four unique MLCs to conform the field.  I prescribing 1000 cGy in 5 sessions utilizing mixed 10 MV/15 MV photons.  Dose volume histograms are reviewed.

## 2015-06-01 ENCOUNTER — Ambulatory Visit
Admission: RE | Admit: 2015-06-01 | Discharge: 2015-06-01 | Disposition: A | Discharge status: Home / Self Care | Payer: BC Managed Care – PPO | Source: Ambulatory Visit | Attending: Radiation Oncology | Admitting: Radiation Oncology

## 2015-06-01 DIAGNOSIS — Z510 Encounter for antineoplastic radiation therapy: Secondary | ICD-10-CM | POA: Diagnosis not present

## 2015-06-02 ENCOUNTER — Ambulatory Visit
Admission: RE | Admit: 2015-06-02 | Discharge: 2015-06-02 | Disposition: A | Discharge status: Home / Self Care | Payer: BC Managed Care – PPO | Source: Ambulatory Visit | Attending: Radiation Oncology | Admitting: Radiation Oncology

## 2015-06-02 DIAGNOSIS — Z510 Encounter for antineoplastic radiation therapy: Secondary | ICD-10-CM | POA: Diagnosis not present

## 2015-06-03 ENCOUNTER — Ambulatory Visit
Admission: RE | Admit: 2015-06-03 | Discharge: 2015-06-03 | Disposition: A | Discharge status: Home / Self Care | Payer: BC Managed Care – PPO | Source: Ambulatory Visit | Attending: Radiation Oncology | Admitting: Radiation Oncology

## 2015-06-03 DIAGNOSIS — Z510 Encounter for antineoplastic radiation therapy: Secondary | ICD-10-CM | POA: Diagnosis not present

## 2015-06-06 ENCOUNTER — Ambulatory Visit
Admission: RE | Admit: 2015-06-06 | Discharge: 2015-06-06 | Disposition: A | Discharge status: Home / Self Care | Payer: BLUE CROSS/BLUE SHIELD | Source: Ambulatory Visit | Attending: Radiation Oncology | Admitting: Radiation Oncology

## 2015-06-06 ENCOUNTER — Encounter: Payer: Self-pay | Admitting: Radiation Oncology

## 2015-06-06 ENCOUNTER — Ambulatory Visit
Admission: RE | Admit: 2015-06-06 | Discharge: 2015-06-06 | Disposition: A | Discharge status: Home / Self Care | Payer: BC Managed Care – PPO | Source: Ambulatory Visit | Attending: Radiation Oncology | Admitting: Radiation Oncology

## 2015-06-06 VITALS — BP 131/71 | HR 65 | Temp 98.6°F | Ht 70.0 in | Wt 209.5 lb

## 2015-06-06 DIAGNOSIS — C50311 Malignant neoplasm of lower-inner quadrant of right female breast: Secondary | ICD-10-CM

## 2015-06-06 DIAGNOSIS — Z510 Encounter for antineoplastic radiation therapy: Secondary | ICD-10-CM | POA: Diagnosis not present

## 2015-06-06 MED ORDER — BIAFINE EX EMUL
CUTANEOUS | Status: DC | PRN
  Administered 2015-06-06: 19:00:00 via TOPICAL

## 2015-06-06 NOTE — Addendum Note (Signed)
Encounter addended by: Benn Moulder, RN on: 06/06/2015  6:39 PM<BR>     Documentation filed: Orders, Dx Association, Inpatient [facility]

## 2015-06-06 NOTE — Progress Notes (Signed)
Weekly Management Note:  Site: Right breast and regional lymph nodes Current Dose:  4500  cGy Projected Dose: 4680  cGy followed by 5 fraction boost  Narrative: The patient is seen today for routine under treatment assessment. CBCT/MVCT images/port films were reviewed. The chart was reviewed.   She continues to have moderate fatigue.  She is developing more discomfort, particularly along the right inframammary region.  She uses Radioplex gel.  Physical Examination:  Filed Vitals:   06/06/15 0937  BP: 131/71  Pulse: 65  Temp: 98.6 F (37 C)  .  Weight: 209 lb 8 oz (95.029 kg).  There is marked hyperpigmentation the skin along the right breast with focal moist desquamation along the mid right inframammary region.  There is patchy dry desquamation elsewhere.  Impression: Tolerating radiation therapy well except for fatigue and radiation dermatitis.  We will go ahead and get her started on Biafine cream.  No antibiotic ointment at this point in time.  Disability papers are being completed.  Plan: Continue radiation therapy as planned.

## 2015-06-06 NOTE — Progress Notes (Signed)
[name] has received 25 fractions to her right breast.  Note a new presentation of erythema in the supraclavicular region and hyperpigmentation of her right breast, inframmary fold and right shoulder blade. Also note mild peeling in the inframmary fold.  She appears fatigued today and admits to taking naps during the day.

## 2015-06-07 ENCOUNTER — Ambulatory Visit
Admission: RE | Admit: 2015-06-07 | Discharge: 2015-06-07 | Disposition: A | Discharge status: Home / Self Care | Payer: BC Managed Care – PPO | Source: Ambulatory Visit | Attending: Radiation Oncology | Admitting: Radiation Oncology

## 2015-06-07 DIAGNOSIS — Z510 Encounter for antineoplastic radiation therapy: Secondary | ICD-10-CM | POA: Diagnosis not present

## 2015-06-08 ENCOUNTER — Other Ambulatory Visit: Payer: Self-pay | Admitting: Hematology

## 2015-06-08 ENCOUNTER — Telehealth: Payer: Self-pay | Admitting: Hematology

## 2015-06-08 ENCOUNTER — Other Ambulatory Visit: Payer: Self-pay | Admitting: *Deleted

## 2015-06-08 ENCOUNTER — Telehealth: Payer: Self-pay | Admitting: *Deleted

## 2015-06-08 ENCOUNTER — Ambulatory Visit
Admission: RE | Admit: 2015-06-08 | Discharge: 2015-06-08 | Disposition: A | Discharge status: Home / Self Care | Payer: BC Managed Care – PPO | Source: Ambulatory Visit | Attending: Radiation Oncology | Admitting: Radiation Oncology

## 2015-06-08 DIAGNOSIS — C7800 Secondary malignant neoplasm of unspecified lung: Principal | ICD-10-CM

## 2015-06-08 DIAGNOSIS — C50919 Malignant neoplasm of unspecified site of unspecified female breast: Secondary | ICD-10-CM

## 2015-06-08 DIAGNOSIS — Z510 Encounter for antineoplastic radiation therapy: Secondary | ICD-10-CM | POA: Diagnosis not present

## 2015-06-08 NOTE — Telephone Encounter (Signed)
stated was told she had to have a new order written to r/s-cld & spoke to Sonia Side in Scheduling for ECHO and she stated pt can call and r/s from CX appt on Oct 7-adv NPR per Linda-didn't sch because was not sure what time was convienent for pt because pt had other appts sch. Talked woth Myrtle and adv also that all pt needed to do was tro call and r/s no order needed.

## 2015-06-08 NOTE — Telephone Encounter (Signed)
Called pt and left message on voice mail re:  Echocardiogram is scheduled at 11 am on Thurs 06/09/15 at [facility] after radiation appt.  Asked pt to call nurse back to confirm that pt had received message.

## 2015-06-09 ENCOUNTER — Ambulatory Visit
Admission: RE | Admit: 2015-06-09 | Discharge: 2015-06-09 | Disposition: A | Discharge status: Home / Self Care | Payer: BC Managed Care – PPO | Source: Ambulatory Visit | Attending: Radiation Oncology | Admitting: Radiation Oncology

## 2015-06-09 ENCOUNTER — Ambulatory Visit (HOSPITAL_COMMUNITY)
Admission: RE | Admit: 2015-06-09 | Discharge: 2015-06-09 | Disposition: A | Discharge status: Home / Self Care | Payer: BLUE CROSS/BLUE SHIELD | Source: Ambulatory Visit | Attending: Hematology | Admitting: Hematology

## 2015-06-09 DIAGNOSIS — C7800 Secondary malignant neoplasm of unspecified lung: Secondary | ICD-10-CM | POA: Diagnosis not present

## 2015-06-09 DIAGNOSIS — Z510 Encounter for antineoplastic radiation therapy: Secondary | ICD-10-CM | POA: Diagnosis not present

## 2015-06-09 DIAGNOSIS — I340 Nonrheumatic mitral (valve) insufficiency: Secondary | ICD-10-CM | POA: Diagnosis not present

## 2015-06-09 DIAGNOSIS — C50919 Malignant neoplasm of unspecified site of unspecified female breast: Secondary | ICD-10-CM | POA: Diagnosis not present

## 2015-06-09 NOTE — Progress Notes (Signed)
  Echocardiogram 2D Echocardiogram has been performed.  Jennette Dubin 06/09/2015, 12:27 PM

## 2015-06-10 ENCOUNTER — Ambulatory Visit
Admission: RE | Admit: 2015-06-10 | Discharge: 2015-06-10 | Disposition: A | Discharge status: Home / Self Care | Payer: BC Managed Care – PPO | Source: Ambulatory Visit | Attending: Radiation Oncology | Admitting: Radiation Oncology

## 2015-06-10 ENCOUNTER — Ambulatory Visit: Payer: BLUE CROSS/BLUE SHIELD | Attending: Radiation Oncology | Admitting: Radiation Oncology

## 2015-06-10 ENCOUNTER — Other Ambulatory Visit (HOSPITAL_COMMUNITY): Payer: BLUE CROSS/BLUE SHIELD

## 2015-06-10 DIAGNOSIS — Z510 Encounter for antineoplastic radiation therapy: Secondary | ICD-10-CM | POA: Diagnosis not present

## 2015-06-13 ENCOUNTER — Telehealth: Payer: Self-pay | Admitting: *Deleted

## 2015-06-13 ENCOUNTER — Ambulatory Visit
Admission: RE | Admit: 2015-06-13 | Discharge: 2015-06-13 | Disposition: A | Discharge status: Home / Self Care | Payer: BC Managed Care – PPO | Source: Ambulatory Visit | Attending: Radiation Oncology | Admitting: Radiation Oncology

## 2015-06-13 ENCOUNTER — Encounter: Payer: Self-pay | Admitting: Radiation Oncology

## 2015-06-13 ENCOUNTER — Ambulatory Visit
Admission: RE | Admit: 2015-06-13 | Discharge: 2015-06-13 | Disposition: A | Discharge status: Home / Self Care | Payer: BLUE CROSS/BLUE SHIELD | Source: Ambulatory Visit | Attending: Radiation Oncology | Admitting: Radiation Oncology

## 2015-06-13 VITALS — BP 130/80 | HR 70 | Temp 98.5°F | Ht 70.0 in

## 2015-06-13 DIAGNOSIS — C50311 Malignant neoplasm of lower-inner quadrant of right female breast: Secondary | ICD-10-CM

## 2015-06-13 DIAGNOSIS — Z510 Encounter for antineoplastic radiation therapy: Secondary | ICD-10-CM | POA: Diagnosis not present

## 2015-06-13 NOTE — Progress Notes (Signed)
[name] [name] has a moist desquamation in her right inframmary fold.  She is using neosporin as instrcuted by Dr. Merlene Laughter last week.  Note dry desquamtion in the right lower axillary region.  Diffuse hyperpigmentation. Denies any pain in the "past couple of days."

## 2015-06-13 NOTE — Telephone Encounter (Signed)
On 06-13-15 fax form to [facility] with two notes that cheryl printed out.

## 2015-06-13 NOTE — Progress Notes (Signed)
Weekly Management Note:  Site: Right breast boost Current Dose:  800  cGy Projected Dose: 1000  cGy  Narrative: The patient is seen today for routine under treatment assessment. CBCT/MVCT images/port films were reviewed. The chart was reviewed.   She is without new complaints today.  She continues with Neosporin along the right inframammary region.  She also uses Biafine cream along the remainder of the breast.  Physical Examination: There were no vitals filed for this visit..  Weight:  .  There is hyperpigmentation of the skin along the right breast and clavicular region with moist desquamation along the inframammary region which is essentially unchanged.  Impression: Tolerating radiation therapy well except for was desquamation along the inframammary region which is not unexpected considering her tumor bed and breast size.  She will finish her radiation therapy tomorrow.  Plan: Continue radiation therapy as planned.  One-month follow-up visit after completion of radiation therapy.

## 2015-06-14 ENCOUNTER — Encounter: Payer: Self-pay | Admitting: Radiation Oncology

## 2015-06-14 ENCOUNTER — Telehealth: Payer: Self-pay | Admitting: Hematology

## 2015-06-14 ENCOUNTER — Ambulatory Visit (HOSPITAL_BASED_OUTPATIENT_CLINIC_OR_DEPARTMENT_OTHER): Payer: BLUE CROSS/BLUE SHIELD | Admitting: Hematology

## 2015-06-14 ENCOUNTER — Encounter: Payer: Self-pay | Admitting: *Deleted

## 2015-06-14 ENCOUNTER — Ambulatory Visit (HOSPITAL_BASED_OUTPATIENT_CLINIC_OR_DEPARTMENT_OTHER): Payer: BLUE CROSS/BLUE SHIELD

## 2015-06-14 ENCOUNTER — Other Ambulatory Visit: Payer: BLUE CROSS/BLUE SHIELD

## 2015-06-14 ENCOUNTER — Telehealth: Payer: Self-pay | Admitting: *Deleted

## 2015-06-14 ENCOUNTER — Encounter: Payer: Self-pay | Admitting: Hematology

## 2015-06-14 ENCOUNTER — Ambulatory Visit
Admission: RE | Admit: 2015-06-14 | Discharge: 2015-06-14 | Disposition: A | Discharge status: Home / Self Care | Payer: BC Managed Care – PPO | Source: Ambulatory Visit | Attending: Radiation Oncology | Admitting: Radiation Oncology

## 2015-06-14 ENCOUNTER — Other Ambulatory Visit (HOSPITAL_BASED_OUTPATIENT_CLINIC_OR_DEPARTMENT_OTHER): Payer: BLUE CROSS/BLUE SHIELD

## 2015-06-14 ENCOUNTER — Ambulatory Visit: Payer: BLUE CROSS/BLUE SHIELD

## 2015-06-14 VITALS — BP 151/78 | HR 77 | Temp 98.5°F | Resp 20 | Ht 70.0 in | Wt 210.2 lb

## 2015-06-14 DIAGNOSIS — C7800 Secondary malignant neoplasm of unspecified lung: Principal | ICD-10-CM

## 2015-06-14 DIAGNOSIS — C7801 Secondary malignant neoplasm of right lung: Secondary | ICD-10-CM

## 2015-06-14 DIAGNOSIS — C7802 Secondary malignant neoplasm of left lung: Secondary | ICD-10-CM

## 2015-06-14 DIAGNOSIS — G629 Polyneuropathy, unspecified: Secondary | ICD-10-CM

## 2015-06-14 DIAGNOSIS — Z510 Encounter for antineoplastic radiation therapy: Secondary | ICD-10-CM | POA: Diagnosis not present

## 2015-06-14 DIAGNOSIS — C50911 Malignant neoplasm of unspecified site of right female breast: Secondary | ICD-10-CM

## 2015-06-14 DIAGNOSIS — Z171 Estrogen receptor negative status [ER-]: Secondary | ICD-10-CM

## 2015-06-14 DIAGNOSIS — Z5112 Encounter for antineoplastic immunotherapy: Secondary | ICD-10-CM

## 2015-06-14 LAB — CBC WITH DIFFERENTIAL/PLATELET
BASO%: 0.3 % (ref 0.0–2.0)
BASOS ABS: 0 10*3/uL (ref 0.0–0.1)
EOS ABS: 0.1 10*3/uL (ref 0.0–0.5)
EOS%: 2.7 % (ref 0.0–7.0)
HCT: 35.6 % (ref 34.8–46.6)
HGB: 11.7 g/dL (ref 11.6–15.9)
LYMPH%: 11.1 % — AB (ref 14.0–49.7)
MCH: 28.3 pg (ref 25.1–34.0)
MCHC: 32.9 g/dL (ref 31.5–36.0)
MCV: 86 fL (ref 79.5–101.0)
MONO#: 0.5 10*3/uL (ref 0.1–0.9)
MONO%: 11.6 % (ref 0.0–14.0)
NEUT#: 3.4 10*3/uL (ref 1.5–6.5)
NEUT%: 74.3 % (ref 38.4–76.8)
Platelets: 206 10*3/uL (ref 145–400)
RBC: 4.14 10*6/uL (ref 3.70–5.45)
RDW: 15 % — ABNORMAL HIGH (ref 11.2–14.5)
WBC: 4.6 10*3/uL (ref 3.9–10.3)
lymph#: 0.5 10*3/uL — ABNORMAL LOW (ref 0.9–3.3)

## 2015-06-14 LAB — COMPREHENSIVE METABOLIC PANEL (CC13)
ALBUMIN: 3.4 g/dL — AB (ref 3.5–5.0)
ALK PHOS: 66 U/L (ref 40–150)
ALT: 15 U/L (ref 0–55)
AST: 17 U/L (ref 5–34)
Anion Gap: 6 mEq/L (ref 3–11)
BUN: 8.7 mg/dL (ref 7.0–26.0)
CALCIUM: 9.1 mg/dL (ref 8.4–10.4)
CHLORIDE: 108 meq/L (ref 98–109)
CO2: 28 mEq/L (ref 22–29)
Creatinine: 0.7 mg/dL (ref 0.6–1.1)
GLUCOSE: 100 mg/dL (ref 70–140)
POTASSIUM: 3.8 meq/L (ref 3.5–5.1)
SODIUM: 141 meq/L (ref 136–145)
Total Bilirubin: <0.3 mg/dL (ref 0.20–1.20)
Total Protein: 7.1 g/dL (ref 6.4–8.3)

## 2015-06-14 MED ORDER — SODIUM CHLORIDE 0.9 % IV SOLN
Freq: Once | INTRAVENOUS | Status: AC
  Administered 2015-06-14: 15:00:00 via INTRAVENOUS

## 2015-06-14 MED ORDER — DIPHENHYDRAMINE HCL 25 MG PO CAPS
50.0000 mg | ORAL_CAPSULE | Freq: Once | ORAL | Status: AC
  Administered 2015-06-14: 50 mg via ORAL

## 2015-06-14 MED ORDER — TRASTUZUMAB CHEMO INJECTION 440 MG
6.0000 mg/kg | Freq: Once | INTRAVENOUS | Status: AC
  Administered 2015-06-14: 567 mg via INTRAVENOUS
  Filled 2015-06-14: qty 27

## 2015-06-14 MED ORDER — ACETAMINOPHEN 325 MG PO TABS
ORAL_TABLET | ORAL | Status: AC
  Filled 2015-06-14: qty 2

## 2015-06-14 MED ORDER — HEPARIN SOD (PORK) LOCK FLUSH 100 UNIT/ML IV SOLN
500.0000 [IU] | Freq: Once | INTRAVENOUS | Status: AC | PRN
  Administered 2015-06-14: 500 [IU]
  Filled 2015-06-14: qty 5

## 2015-06-14 MED ORDER — ACETAMINOPHEN 325 MG PO TABS
650.0000 mg | ORAL_TABLET | Freq: Once | ORAL | Status: AC
Start: 2015-06-14 — End: 2015-06-14
  Administered 2015-06-14: 650 mg via ORAL

## 2015-06-14 MED ORDER — SODIUM CHLORIDE 0.9 % IJ SOLN
10.0000 mL | INTRAMUSCULAR | Status: DC | PRN
  Administered 2015-06-14: 10 mL
  Filled 2015-06-14: qty 10

## 2015-06-14 MED ORDER — DIPHENHYDRAMINE HCL 25 MG PO CAPS
ORAL_CAPSULE | ORAL | Status: AC
  Filled 2015-06-14: qty 2

## 2015-06-14 MED ORDER — SODIUM CHLORIDE 0.9 % IJ SOLN
10.0000 mL | INTRAMUSCULAR | Status: DC | PRN
  Administered 2015-06-14: 10 mL via INTRAVENOUS
  Filled 2015-06-14: qty 10

## 2015-06-14 NOTE — Progress Notes (Signed)
[facility] Follow up note   Patient Care Team: [name], DO as PCP - General (Family Medicine) [name], DO (Family Medicine) Holley Bouche, NP as Nurse Practitioner (Nurse Practitioner) Stark Klein, MD as Consulting Physician (General Surgery) Arloa Koh, MD as Consulting Physician (Radiation Oncology) [name], MD as Consulting Physician (Hematology)  CHIEF COMPLAINTS Follow up metastatic breast cancer  Oncology History   Breast cancer 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   Staging form: Breast, AJCC 7th Edition     Clinical stage from 07/22/2014: Stage IV (T3, N1, M1) - Unsigned       Breast cancer 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 ([place])   07/02/2014 Mammogram Mammogram showed a 2cm right beast mass and a 1.8cm right axillary node. MRI breast on 07/16/2014 showed 7cm R breast lesion and 4.4cm r axillary node    07/02/2014 Imaging CT CAP: a 4.7cm mass in LUL lung and a 2.1cm mas in RML, and a small nodule in RUL, suspecious for metastasis     07/09/2014 Initial Diagnosis right IDA with b/l lung lesions, ER-/PR-/HER2+   07/09/2014 Initial Biopsy US guided right breast mass and axillary node biopsy showed IDA, and DCIS, ER-/PR-/HER2+   07/26/2014 Pathologic Stage Left lung mass by IR, path revealed high grade carcinoma, morphology similar to breast tumor biopsy, TTF(-), NapsinA(-), ER(-)   08/04/2014 - 03/22/2015 Chemotherapy weekly Paclitaxel 32m/m2, trastuzumab and pertuzumab every 3 weeks   10/04/2014 Imaging Interval decrease in the right axillary lymphadenopathy. Bilateral pulmonary lesions with left hilar lymphadenopathy also markedly decreased in the interval. The left hilar lymphadenopathy has resolved.   12/20/2014 Imaging restaging CT showed stable disease, no new lesions    03/29/2015 Pathology Results  right breast lumpectomy showed  chemotherapy treatment effect,  a 1 mm residual tumor,   margins were widely negative, 5 sentinel lymph nodes and 2  axillary lymph nodes were negative.    03/29/2015 Surgery  right breast lumpectomy and sentinel lymph node biopsy  by Dr. BBarry Dienes  04/12/2015 -  Chemotherapy  Herceptin maintenance therapy , 6 mg/kg, every 3 weeks   05/03/2015 -  Radiation Therapy right breast adjuvant irradiation by Dr. MValere Dross    CURRENT THERAPY: Breast irradiation, started on 05/03/2015. Herceptin maintenance therapy every 3 weeks, started on 04/12/2015  INTERVAL HISTROY   NCherereturns for follow-up.  She completed her last dose of breast radiation today. She tolerated radiation well overall, does have mild fatigue, and moderate radiation dermatitis, with some shallow skin ulcers. No significant pain. She otherwise is doing well, has good appetite and eating well. Her hair and nails are growing back normally.   MEDICAL HISTORY:  Past Medical History  Diagnosis Date  . Breast cancer ([facility]   . Pneumonia     hx of pneumonia 08/2013   . GERD (gastroesophageal reflux disease)     during pregnancy   . [place]   . [place]    SURGICAL HISTORY: Past Surgical History  Procedure Laterality Date  . Cesarean section    . Essure tubal ligation    . Cholecystectomy    . Wisdom tooth extraction    . Portacath placement Left 07/21/2014    Procedure: INSERTION PORT-A-CATH;  Surgeon: FStark Klein MD;  Location: [facility];  Service: General;  Laterality: Left;  . Breast lumpectomy with radioactive seed and sentinel lymph node biopsy Right 03/29/2015    Procedure: BREAST LUMPECTOMY WITH RADIOACTIVE SEED AND SENTINEL LYMPH NODE  BIOPSY;  Surgeon: Stark Klein, MD;  Location: [place] [facility];  Service: General;  Laterality: Right;    SOCIAL HISTORY: History   Social History  . Marital Status: Married    Spouse Name: N/A    Number of Children:  she has 2 daughters at the age of 69 and 4.   . Years of Education: N/A   Occupational History  .  works as Freight forwarder for a [employer].    Social History Main Topics  . Smoking status: Never Smoker   . Smokeless tobacco: Not on file  . Alcohol Use: Yes  . Drug Use: No  . Sexual Activity: No    FAMILY HISTORY: Family History  Problem Relation Age of Onset  . Breast cancer Mother 54  . Liver cancer Father 30  . Breast cancer Maternal Aunt 36  . Prostate cancer Maternal Grandfather   . Liver cancer Paternal Grandmother   . Prostate cancer Paternal Grandfather      GENETICS: 08/30/2014 BreastNext panel was negative. 17 genes including BRCA1, BRCA2, were negative for mutations.   ALLERGIES:  is allergic to adhesive; aspirin; and codeine.  MEDICATIONS:  Current Outpatient Prescriptions  Medication Sig Dispense Refill  . Camphor-Eucalyptus-Menthol (VICKS VAPORUB EX) Apply 1 application topically at bedtime. Applies under nose, throat and on chest.    . cetirizine (ZYRTEC) 10 MG tablet Take 10 mg by mouth daily as needed for allergies (allergies).     . clindamycin (CLINDAGEL) 1 % gel Apply topically to affected areas BID. 60 g 3  . hyaluronate sodium (RADIAPLEXRX) GEL Apply 1 application topically 2 (two) times daily.    . hydrocortisone 2.5 % cream APPLY TOPICALLY TO THE AFFECTED AREA TWICE DAILY 454 g 0  . lidocaine-prilocaine (EMLA) cream Apply topically as needed. 30 g 2  . loperamide (IMODIUM A-D) 2 MG tablet Take 1 tablet (2 mg total) by mouth 4 (four) times daily as needed for diarrhea or loose stools. 30 tablet 0  . LORazepam (ATIVAN) 1 MG tablet Take 1 tablet (1 mg total) by mouth once. 2 tablet 0  . non-metallic deodorant (ALRA) MISC Apply 1 application topically daily as needed.    . ondansetron (ZOFRAN) 8 MG tablet Take 1 tablet (8 mg total) by mouth every 8 (eight) hours as needed for nausea or vomiting. 30 tablet 3  . oxyCODONE-acetaminophen (ROXICET) 5-325 MG per tablet Take 1-2 tablets by mouth every 4 (four) hours as needed for severe pain. 30 tablet 0  . pantoprazole (PROTONIX) 20 MG  tablet Take 1 tablet (20 mg total) by mouth daily. 30 tablet 3  . Pseudoeph-Doxylamine-DM-APAP (NYQUIL PO) Take 30 mLs by mouth 2 (two) times daily as needed (cold/allergies).      No current facility-administered medications for this visit.   Facility-Administered Medications Ordered in Other Visits  Medication Dose Route Frequency Provider Last Rate Last Dose  . sodium chloride 0.9 % injection 10 mL  10 mL Intravenous PRN [name], MD   10 mL at 06/14/15 1338    REVIEW OF SYSTEMS:   Constitutional: Denies fevers, chills or abnormal night sweats, (+) malaise  Eyes: Denies blurriness of vision, double vision or watery eyes Ears, nose, mouth, throat, and face: Denies mucositis or sore throat Respiratory: (+) productive cough, no dyspnea or wheezes Cardiovascular: Denies palpitation, chest discomfort or lower extremity swelling Gastrointestinal:  Denies nausea, heartburn or change in bowel habits Skin:(+)  skin pigmentation on bilateral forearms and left elbow Lymphatics: Denies  new lymphadenopathy or easy bruising Neurological:Denies numbness, tingling or new weaknesses Behavioral/Psych: Mood is stable, no new changes  All other systems were reviewed with the patient and are negative.  PHYSICAL EXAMINATION: ECOG PERFORMANCE STATUS: 1 BP 151/78 mmHg  Pulse 77  Temp(Src) 98.5 F (36.9 C) (Oral)  Resp 20  Ht 5' 10" (1.778 m)  Wt 210 lb 3.2 oz (95.346 kg)  BMI 30.16 kg/m2  SpO2 100% GENERAL:alert, no distress and comfortable SKIN: skin color, texture, turgor are normal, no rashes or significant lesions EYES: normal, conjunctiva are pink and non-injected, sclera clear OROPHARYNX:no exudate, no erythema and lips, buccal mucosa, and tongue normal  NECK: supple, thyroid normal size, non-tender, without nodularity LYMPH:  no palpable lymphadenopathy in the cervical, axillary or inguinal LUNGS: clear to auscultation and percussion with normal breathing effort HEART: regular rate &  rhythm and no murmurs and no lower extremity edema ABDOMEN:abdomen soft, non-tender and normal bowel sounds Musculoskeletal:no cyanosis of digits and no clubbing  PSYCH: alert & oriented x 3 with fluent speech NEURO: no focal motor/sensory deficits Breasts:  Status post right breast lumpectomy and sentinel lymph node biopsy, incisions have healed well. There is a 5cm lump above her breast incision site, non-tender. (+) Moderate skin erythema and pigmentation of right breast secondary to radiation, (+) multiple shallow skin ulcers under her right breast. Palpation of left breast  and axilla revealed no other obvious mass that I could appreciate. Skin: (+) Skin pigmentation on hands and feet, lighter than before. No skin rashes. (+)  pigmentation on fingernails, and bulging of fingernails and thickening of soft tissue under nailbed. There is a few healed skin lesion in the upper mid abdomen.  LABORATORY DATA:  I have reviewed the data as listed CBC Latest Ref Rng 06/14/2015 05/24/2015 05/03/2015  WBC 3.9 - 10.3 10e3/uL 4.6 5.8 5.4  Hemoglobin 11.6 - 15.9 g/dL 11.7 11.9 11.1(L)  Hematocrit 34.8 - 46.6 % 35.6 36.7 33.8(L)  Platelets 145 - 400 10e3/uL 206 236 240    CMP Latest Ref Rng 06/14/2015 05/24/2015 05/03/2015  Glucose 70 - 140 mg/dl 100 74 105  BUN 7.0 - 26.0 mg/dL 8.7 11.0 8.5  Creatinine 0.6 - 1.1 mg/dL 0.7 0.7 0.8  Sodium 136 - 145 mEq/L 141 139 142  Potassium 3.5 - 5.1 mEq/L 3.8 4.2 3.5  Chloride 96 - 112 mmol/L - - -  CO2 22 - 29 mEq/L _0 Calcium 8.4 - 10.4 mg/dL 9.1 9.4 9.1  Total Protein 6.4 - 8.3 g/dL 7.1 7.6 6.7  Total Bilirubin 0.20 - 1.20 mg/dL <0.30 0.47 0.31  Alkaline Phos 40 - 150 U/L 66 69 57  AST 5 - 34 U/L _1 ALT 0 - 55 U/L _2 PATHOLOGY REPORT 07/09/2014 #1 breast, right needle core biopsy more anterior -Invasive ductal carcinoma -Ductal carcinoma in situ #2 breast, right needle core biopsy, posterior -Invasive ductal carcinoma #3 lymph  node, needle core biopsy, axillary -Ductal carcinoma  ER negative, PR negative, HER-2 positive (Copy number: 9.35, ration 6. 45)  Lung, biopsy, Left 07/26/2014 - HIGH GRADE CARCINOMA, SEE COMMENT. Microscopic Comment The carcinoma demonstrates the following immunophenotype: TTF-1 - negative expression. Napsin A- negative expression. CK5/6 - focal, moderate expression. estrogen receptor - negative expression. GCDFP- negative expression. The recent breast biopsy demonstrating Her2 amplified high grade invasive mammary carcinoma is noted ([id number]). Although the immunophenotype of the current case is non-sepcific, it strongly argues against primary lung adenocarcinoma.  However, on re-review, the morphology of the current case is essentially identical to the primary mammary carcinoma. In lieu of further immunophenotyping, the tumor will be submitted for Her2 testing and the remaining tissue will be reserved for additional ancillary tumor testing. The results of the Her2 testing will be reported in an addendum. The case was discussed with Dr Burr Medico on 07/28/2015 and 07/29/2015 HER2 POSITIVE   Diagnosis 03/29/2015 1. Breast, lumpectomy, Right - INVASIVE DUCTAL CARCINOMA, SEE COMMENT. - SEE TUMOR SYNOPTIC TEMPLATE BELOW. 2. Lymph node, sentinel, biopsy, Right axillary #1 - ONE LYMPH NODE, NEGATIVE FOR TUMOR (0/1). - SEE COMMENT. 3. Lymph node, sentinel, biopsy, Right axillary #2 - BENIGN FIBROFATTY SOFT TISSUE. - NEGATIVE FOR ATYPIA OR MALIGNANCY. - NEGATIVE FOR LYMPH NODE. 4. Lymph node, sentinel, biopsy, Right axillary #3 - ONE LYMPH NODE, NEGATIVE FOR TUMOR (0/1). - SEE COMMENT. 5. Lymph node, sentinel, biopsy, Right axillary #4 - ONE LYMPH NODE, NEGATIVE FOR TUMOR (0/1). - SEE COMMENT. 6. Lymph node, sentinel, biopsy, Right axillary #5 - ONE LYMPH NODE, NEGATIVE FOR TUMOR (0/1). - SEE COMMENT. 7. Lymph node, sentinel, biopsy, right axillary - ONE LYMPH NODE, NEGATIVE FOR TUMOR  (0/1). - SEE COMMENT. 8. Lymph node, sentinel, biopsy, right axillary - ONE LYMPH NODE, NEGATIVE FOR TUMOR (0/1). - SEE COMMENT. 1 of 4 Amended copy Amended FINAL for AISHIA, BARKEY ([id number]) Microscopic Comment 1. BREAST, INVASIVE TUMOR, WITH LYMPH NODES PRESENT Specimen, including laterality and lymph node sampling (sentinel, non-sentinel): Right breast with sentinel and non-sentinel lymph node sampling Procedure: Lumpectomy Histologic type: Ductal, see comment Grade: 2 of 3, see comment. Tubule formation: 3 Nuclear pleomorphism: 3 Mitotic: 1 Tumor size (glass slide measurement): 1 mm, see comment Margins: Invasive, distance to closest margin: See comment In-situ, distance to closest margin: N/A If margin positive, focally or broadly: N/A Lymphovascular invasion: Absent Ductal carcinoma in situ: Absent Grade: N/A Extensive intraductal component: N/A Lobular neoplasia: Absent Tumor focality: Unifocal, see comment Treatment effect: Present If present, treatment effect in breast tissue, lymph nodes or both: Both breast tissue and lymph nodes Extent of tumor: Skin: N/A Nipple: N/A Skeletal muscle: N/A Lymph nodes: Examined: 5 Sentinel 2 Non-sentinel 7 Total Lymph nodes with metastasis: 0 Isolated tumor cells (< 0.2 mm): N/A Micrometastasis: (> 0.2 mm and < 2.0 mm): N/A Macrometastasis: (> 2.0 mm): N/A Extracapsular extension: N/A Breast prognostic profile: Estrogen receptor: Not repeated, previous study demonstrated 0% positivity ([id number]). Progesterone receptor: Not repeated, previous study demonstrated 0% positivity ([id number]) Her 2 neu: Demonstrates amplificaition ([id number]) Ki-67: Not repeated, previous study demonstrate 79% proliferation rate ([id number]). Non-neoplastic breast: Neoadjuvant related tissue changes and adenosis. TNM: ypT34m, ypN0, pMX Comments: Numerous representative sections including the 2.0 cm hemorrhagic tissue associated  with X-shaped biopsy clip, the 2.0 cm ill defined lesion associated with M-shaped clip, and intervening tissue were submitted for review. Slide sections from the tissue surrounding the X-shaped and M-shaped clips demonstrate tissue changes consistent with neoadjuvant related change. There is no invasive or in situ carcinom present at either site. However, in a representative section from the intervening tissue (slide G), there is a 1 mm focus of high grade invasive ductal carcinoma present. Given the minute size of the tumor, tumor grading is limited. The tumor is present within non-marginal tissue sections and is considered to be at least 0.5 cm from the nearest margin (medial).   Note: Amendment issued for modification of synoptic table, inadvertent typographical error. The change in the synoptic table was discussed with Dr   on 04/07/2015. (CRR:ecj 04/07/2015) 2. , 4-8. In parts 2 and 4, there is extensive neoadjuvant related tissue changes including abundant foamy macrophages, fibroinflammatory reaction and dystrophic calcifications. The neoadjuvant tissue changes extend into perinodal soft tissue and are either focally or broadly present at the cauterized edge of the tissue submitted. There are no definitive features of malignancy present.  RADIOGRAPHIC STUDIES: CT CAP 10/04/2014 IMPRESSION: Interval decrease in the right axillary lymphadenopathy.  Bilateral pulmonary lesions with left hilar lymphadenopathy also markedly decreased in the interval. The left hilar lymphadenopathy has resolved.  3 millimeter nonobstructing right renal stone.   CT chest, abdomen and pelvis with contrast 12/20/2014 IMPRESSION: 1. Today's study is very similar to the prior examination from 10/04/2014, demonstrating stable size of metastatic lesions in the right middle lobe and left upper lobe, and no new evidence of metastatic disease in the thorax. 2. 2.0 x 0.9 cm lesion in the medial aspect of the  right breast is similar to prior examination. No residual right axillary lymphadenopathy. 3. As with prior examinations, there is no evidence of metastatic disease to the abdomen or pelvis. 4. 3 mm nonobstructive calculus in the lower pole collecting system of the right kidney.  PET scan 02/21/2015  IMPRESSION: 1. No evidence of metabolically active breast cancer metastasis. 2. Stable parenchymal thickening in the upper lobes at site of prior pulmonary masses. These stable pulmonary parenchymal thicken does not have associated metabolic activity.   ECHO 06/09/2015 Study Conclusion - Left ventricle: The cavity size was normal. Global longitudinal strain -21.8%. Systolic function was vigorous. The estimated ejection fraction was in the range of 65% to 70%. Wall motion was normal; there were no regional wall motion abnormalities. Left ventricular diastolic function parameters were normal. - Aortic valve: Transvalvular velocity was within the normal range. There was no stenosis. There was no regurgitation. - Mitral valve: There was mild regurgitation. - Right ventricle: The cavity size was normal. Wall thickness was normal. Systolic function was normal. - Tricuspid valve: There was no regurgitation. - Pulmonic valve: There was trivial regurgitation.   ASSESSMENT & PLAN:  44 year old African-American female, premenopausal, without significant past medical history, presented with palpable right breast mass and right axillary mass.   1. 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 - I reviewed her breasts surgical pathology results with her in great detail ,  She has had fantastic partial response (near complete response) form 8 months of chemotherapy and antibody therapy.    The initial 6.3 cm breast mass has only 1 mm residual tumor,  And the previously 4.4 cm right axillary lymph node has had complete  Pathological response, all 7 nodes were negative for cancer. -She now has just completed breast radiation  -continue herceptin indefinitely as long as her cancer is controlled. -repeated echo was normal  -I'll obtain a restaging CT chest, abdomen and pelvis with contrast before next visit.  2. Right breast mass at incision site -This is likely surgical chang/seroma. -Due to the radiation dermatitis of right breast, and she is can have a CT chest in 3 weeks, I'll cancel her breast mammogram.  3. G1 peripheral neuropathy, skin pigmentation, hands and feet -improving. We'll continue to monitor closely.  4. Recent shingle infection in upper abdomen -Resolved now.  Plan -Continue Herceptin every 3 weeks, treatment today. -  I'll see her back in 3 weeks with lab and restaging CT CAP with contrast     I spent about 20 minutes counseling the patient and her family members, total care was about 25 minutes.  [name]  06/14/2015

## 2015-06-14 NOTE — Progress Notes (Addendum)
[facility] End of Treatment Note  Name:[name] [name]  Date: 06/14/2015 NID:[id number] DOB:1971/04/19   Status:outpatient    CC: BRADLEY, CANDACE, DO  Dr. Stark Klein  REFERRING PHYSICIAN:  Dr. Stark Klein   DIAGNOSIS: Pathologic stage ypT63m, ypN0 (M1) invasive ductal carcinoma of the right breast, metastatic to lung   INDICATION FOR TREATMENT: Improvement of local regional control   TREATMENT DATES: 05/03/2015 through 06/14/2015                          SITE/DOSE:  Right breast 4680 cGy in 26 sessions, right breast boost 1000 cGy in 5 sessions.  Right supraclavicular/axillary region 4680 cGy with a supplemental PA field to bring the axillary dose up to 4500 cGy in 26 sessions.                        BEAMS/ENERGY:   Tangential fields to the right breast with mixed 10 MV/15 MV photons.  10 MV photons to the right supraclavicular/axillary region, doses prescribed at 3 cm depth with a supplemental 6 MV photon field PA.  4 field right breast tumor bed boost with 10 MV photons.                NARRATIVE:  The patient tolerated treatment reasonably well, but she developed left-sided anterior chest shingles during her first week of radiation therapy.  She was quickly started on  Valtrex without development of further lesions or discomfort.  By completion of therapy she developed a moist desquamation along the right inframammary region which was treated with antibiotic ointment.  This is expected to re-epithelialize within the next week.  She also uses Biafine cream along the remainder of her breast and clavicular region.                      PLAN: Routine followup in one month. Patient instructed to call if questions or worsening complaints in interim.

## 2015-06-14 NOTE — Patient Instructions (Signed)
[facility] Discharge Instructions for Patients Receiving Chemotherapy  Today you received the following chemotherapy agents herceptin   If you develop nausea and vomiting that is not controlled by your nausea medication, call the clinic.   BELOW ARE SYMPTOMS THAT SHOULD BE REPORTED IMMEDIATELY:  *FEVER GREATER THAN 100.5 F  *CHILLS WITH OR WITHOUT FEVER  NAUSEA AND VOMITING THAT IS NOT CONTROLLED WITH YOUR NAUSEA MEDICATION  *UNUSUAL SHORTNESS OF BREATH  *UNUSUAL BRUISING OR BLEEDING  TENDERNESS IN MOUTH AND THROAT WITH OR WITHOUT PRESENCE OF ULCERS  *URINARY PROBLEMS  *BOWEL PROBLEMS  UNUSUAL RASH Items with * indicate a potential emergency and should be followed up as soon as possible.  Feel free to call the clinic you have any questions or concerns. The clinic phone number is [phone].  Please show the CHEMO ALERT CARD at check-in to the Emergency Department and triage nurse.   

## 2015-06-14 NOTE — Telephone Encounter (Signed)
per pof to sch pt appt-gave pt copy of avs-sent MW email to sch trmt-pt will look @ MY HC[facility]**

## 2015-06-14 NOTE — Telephone Encounter (Signed)
Per staff message and POF I have scheduled appts. Advised scheduler of appts. JMW  

## 2015-06-14 NOTE — Progress Notes (Signed)
Simulation verification note: The patient underwent simulation verification on 06/08/2015 for her right breast boost.  Her isocenter was in good position and the multileaf collimators contoured the tumor bed appropriately.

## 2015-06-20 ENCOUNTER — Ambulatory Visit: Payer: BLUE CROSS/BLUE SHIELD

## 2015-06-20 ENCOUNTER — Ambulatory Visit: Payer: BLUE CROSS/BLUE SHIELD | Attending: General Surgery | Admitting: Physical Therapy

## 2015-06-20 DIAGNOSIS — C50311 Malignant neoplasm of lower-inner quadrant of right female breast: Secondary | ICD-10-CM | POA: Diagnosis present

## 2015-06-20 DIAGNOSIS — Z9189 Other specified personal risk factors, not elsewhere classified: Secondary | ICD-10-CM | POA: Diagnosis present

## 2015-06-20 DIAGNOSIS — M25611 Stiffness of right shoulder, not elsewhere classified: Secondary | ICD-10-CM | POA: Insufficient documentation

## 2015-06-20 DIAGNOSIS — R609 Edema, unspecified: Secondary | ICD-10-CM | POA: Diagnosis present

## 2015-06-20 NOTE — Therapy (Signed)
[place], [place], [address] Phone: [phone]   Fax:  [phone]  Physical Therapy Treatment  Patient Details  Name: [name] [name] MRN: [id number] Date of Birth: 04/12/1971 Referring Provider: Dr. Stark [name]  Encounter Date: 06/20/2015      PT End of Session - 06/20/15 1250    Visit Number 8   Number of Visits 16   Date for PT Re-Evaluation 07/19/15   PT Start Time 0855   PT Stop Time 0932   PT Time Calculation (min) 37 min   Activity Tolerance Patient tolerated treatment well   Behavior During Therapy [facility] for tasks assessed/performed      Past Medical History  Diagnosis Date  . Breast cancer ([place])   . Pneumonia     hx of pneumonia 08/2013   . GERD (gastroesophageal reflux disease)     during pregnancy   . Breast cancer ([place])   . Breast cancer, right breast ([place]) 04/14/2015    Past Surgical History  Procedure Laterality Date  . Cesarean section    . Essure tubal ligation    . Cholecystectomy    . Wisdom tooth extraction    . Portacath placement Left 07/21/2014    Procedure: INSERTION PORT-A-CATH;  Surgeon: [name] Klein, MD;  [facility];  Service: General;  Laterality: Left;  . Breast lumpectomy with radioactive seed and sentinel lymph node biopsy Right 03/29/2015    Procedure: BREAST LUMPECTOMY WITH RADIOACTIVE SEED AND SENTINEL LYMPH NODE BIOPSY;  Surgeon: [name] Klein, MD;  Location: [place];  Service: General;  Laterality: Right;    There were no vitals filed for this visit.  Visit Diagnosis:  At risk for lymphedema - Plan: PT plan of care cert/re-cert  Stiffness of joint, shoulder region, right - Plan: PT plan of care cert/re-cert      Subjective Assessment - 06/20/15 0857    Subjective Finished radiation last week.  Skin burns are healing.  Inferior to right breast was the worst area--in the crease underneath the breast.  Hasn't exercised through  treatment.  Still tiring easily.   Currently in Pain? Yes   Pain Score 6    Pain Location Chest  inferior to breast   Pain Orientation Right   Pain Descriptors / Indicators Sore  "not pain, just soreness"   Pain Onset 1 to 4 weeks ago   Aggravating Factors  friction on the area   Pain Relieving Factors nothing            [facility] PT Assessment - 06/20/15 0001    Assessment   Referring Provider Dr. Stark [name]   Observation/Other Assessments   Skin Integrity Skin check shows darkened areas at right neck, chest, underarm, and breast; area just inferior to breast is still quite red,and patient is using a nonstick gauze pad there to pad and keep area there dry where it has been weapy in the past; she is still using Biafine cream there.     AROM   Right Shoulder Flexion 145 Degrees   Right Shoulder ABduction 160 Degrees   Right Shoulder Internal Rotation 80 Degrees   Right Shoulder External Rotation 77 Degrees   Left Shoulder External Rotation 87 Degrees                     [facility] Adult PT Treatment/Exercise - 06/20/15 0001    Shoulder Exercises: Seated   Other Seated Exercises backward shoulder rolls x 5  some pain with movement of the breast that results   Other Seated Exercises active shoulder ER with scapular retraction bilat.x 5; tried seated protraction, but painful to right shoulder, so stopped   Manual Therapy   Manual Therapy --   Myofascial Release Right UE myofascial pulling in supine and then scapular mobilization in left sidelying.   Passive ROM Right shoulder for ER, abduction, and flexion.                        [facility] Goals - 06/20/15 1253    CC Long Term Goal  #2   Title Patient will be independent in a home exercise program for strenght improvement to decrease lymphedema risk with > 5 nodes removed   Status On-going   CC Long Term Goal  #3   Title Patient will be know how to obtain and use compression garments for  prophylaxis   Status On-going   CC Long Term Goal  #4   Title Patient will decrease the DASH score to < 20   to demonstrate increased functional use of upper extremity   Status On-going            Plan - 06/20/15 0931    Clinical Impression Statement Patient has just completed radiation treatment.  She reports her skin is healing from this, but she does still have some tenderness and redness, particularly just inferior to her right breast.  She also still has some fatigue.  Right shoulder AROM has been maintained and increased a bit.   Pt will benefit from skilled therapeutic intervention in order to improve on the following deficits Impaired UE functional use;Decreased activity tolerance;Decreased knowledge of precautions;Impaired perceived functional ability;Decreased knowledge of use of DME;Decreased strength;Increased edema;Increased fascial restricitons   Rehab Potential Excellent   Clinical Impairments Affecting Rehab Potential months of chemo with some sensory impairment,  radiation therapy    PT Frequency 2x / week   PT Duration 4 weeks   PT Treatment/Interventions Manual techniques;Therapeutic exercise   PT Next Visit Plan Assess how she did after this session; continue gentle stretching; consider stationary bike or other endurance exercise; progress to strength ABC program instruction as she heals and when appropriate.   Consulted and Agree with Plan of Care Patient        Problem List Patient Active Problem List   Diagnosis Date Noted  . Shingles 05/12/2015  . Diarrhea 04/29/2015  . 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 Rash 08/17/2014  . Fever 08/04/2014  . Breast cancer 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 ([place]) 07/22/2014  . Family history of breast cancer 07/22/2014  . Lung mas bilaterial 07/22/2014    [name],[name] 06/20/2015, 12:57  PM  [place], [place], [address] Phone: [phone]   Fax:  [phone]  Name: [name] [name] MRN: [id number] Date of Birth: 07/15/1971    [name] [name], PT 06/20/2015 12:57 PM

## 2015-06-21 ENCOUNTER — Encounter: Payer: BLUE CROSS/BLUE SHIELD | Admitting: Physical Therapy

## 2015-06-22 ENCOUNTER — Encounter: Payer: Self-pay | Admitting: Radiation Oncology

## 2015-06-22 ENCOUNTER — Ambulatory Visit: Payer: BLUE CROSS/BLUE SHIELD

## 2015-06-22 DIAGNOSIS — R609 Edema, unspecified: Secondary | ICD-10-CM

## 2015-06-22 DIAGNOSIS — M25611 Stiffness of right shoulder, not elsewhere classified: Secondary | ICD-10-CM

## 2015-06-22 DIAGNOSIS — Z9189 Other specified personal risk factors, not elsewhere classified: Secondary | ICD-10-CM

## 2015-06-22 DIAGNOSIS — C50311 Malignant neoplasm of lower-inner quadrant of right female breast: Secondary | ICD-10-CM

## 2015-06-22 NOTE — Progress Notes (Signed)
Paperwork (disability form) given to RN 06/22/15 Ardeen Fillers)

## 2015-06-22 NOTE — Therapy (Signed)
[place], [place], [address] Phone: [phone]   Fax:  [phone]  Physical Therapy Treatment  Patient Details  Name: [name] [name] MRN: [id number] Date of Birth: May 27, 1971 Referring Provider: Dr. Stark Klein  Encounter Date: 06/22/2015      PT End of Session - 06/22/15 0905    Visit Number 9   Number of Visits 16   Date for PT Re-Evaluation 07/19/15   PT Start Time 0848   PT Stop Time 0933   PT Time Calculation (min) 45 min   Activity Tolerance Patient tolerated treatment well   Behavior During Therapy [facility] for tasks assessed/performed      Past Medical History  Diagnosis Date  . Breast cancer ([place])   . Pneumonia     hx of pneumonia 08/2013   . GERD (gastroesophageal reflux disease)     during pregnancy   . Breast cancer ([place])   . Breast cancer, right breast ([place]) 04/14/2015    Past Surgical History  Procedure Laterality Date  . Cesarean section    . Essure tubal ligation    . Cholecystectomy    . Wisdom tooth extraction    . Portacath placement Left 07/21/2014    Procedure: INSERTION PORT-A-CATH;  Surgeon: Stark Klein, MD;  [facility];  Service: General;  Laterality: Left;  . Breast lumpectomy with radioactive seed and sentinel lymph node biopsy Right 03/29/2015    Procedure: BREAST LUMPECTOMY WITH RADIOACTIVE SEED AND SENTINEL LYMPH NODE BIOPSY;  Surgeon: Stark Klein, MD;  Location: [facility];  Service: General;  Laterality: Right;    There were no vitals filed for this visit.  Visit Diagnosis:  At risk for lymphedema  Stiffness of joint, shoulder region, right  Postoperative edema  Carcinoma of lower-inner quadrant of right breast [facility])      Subjective Assessment - 06/22/15 0851    Subjective Felt good after last session, no increase pain. Feel fine this morning, no pain. My Rt chest is still just really tender.    Currently in Pain? No/denies                         [facility] Adult PT Treatment/Exercise - 06/22/15 0001    Knee/Hip Exercises: Aerobic   Stationary Bike Level 1 x 5 minutes; pt SOB after   Shoulder Exercises: Pulleys   Flexion 2 minutes   ABduction 2 minutes   Manual Therapy   Myofascial Release Right UE myofascial pulling in supine   Passive ROM Right shoulder for ER, abduction, and flexion.to pts tolerance.                PT Education - 06/22/15 0935    Education provided Yes   Education Details Discussed with pt [employer] program that is free for cancer pts and is every Tues and Thurs at 2:00 and answered her questions regarding this especially in regards to pt reporting not good about being compliant with HEP when she is doing it by herself and she really liked the idea of this being closer to her and her and her mom could do together as she is also a cancer survivor.    Person(s) Educated Patient   Methods Explanation   Comprehension Verbalized understanding                [place] - 06/20/15 1253    CC Long Term Goal  #2  Title Patient will be independent in a home exercise program for strenght improvement to decrease lymphedema risk with > 5 nodes removed   Status On-going   CC Long Term Goal  #3   Title Patient will be know how to obtain and use compression garments for prophylaxis   Status On-going   CC Long Term Goal  #4   Title Patient will decrease the DASH score to < 20   to demonstrate increased functional use of upper extremity   Status On-going            Plan - 06/22/15 0905    Clinical Impression Statement Pt tolerated treatment well only experiencing some pain/discomfort at end ROM of external rotation with PROM stretching. Encouraged pt to continue with stretching at home until next appointment though she reports she isn't good at being consistent but will try.    Pt will benefit from skilled therapeutic intervention in order to  improve on the following deficits Impaired UE functional use;Decreased activity tolerance;Decreased knowledge of precautions;Impaired perceived functional ability;Decreased knowledge of use of DME;Decreased strength;Increased edema;Increased fascial restricitons   Rehab Potential Excellent   Clinical Impairments Affecting Rehab Potential months of chemo with some sensory impairment,  radiation therapy    PT Frequency 2x / week   PT Duration 4 weeks   PT Treatment/Interventions Manual techniques;Therapeutic exercise   PT Next Visit Plan Continue gentle stretching; consider stationary bike or other endurance exercise; progress to strength ABC program instruction as she heals and when appropriate.   Recommended Other Services Exercise program at [facility] and Agree with Plan of Care Patient        Problem List Patient Active Problem List   Diagnosis Date Noted  . Shingles 05/12/2015  . Diarrhea 04/29/2015  . Cancer of lower-inner quadrant of female breast ([place]) 04/14/2015  . 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 Rash 08/17/2014  . Fever 08/04/2014  . Breast cancer 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 ([place]) 07/22/2014  . Family history of breast cancer 07/22/2014  . Lung mas bilaterial 07/22/2014    Otelia Limes, PTA 06/22/2015, 9:37 AM  [place], [place], [address] Phone: [phone]   Fax:  [phone]  Name: [name] [name] MRN: [id number] Date of Birth: 11/20/1970

## 2015-06-23 ENCOUNTER — Encounter: Payer: BLUE CROSS/BLUE SHIELD | Admitting: Physical Therapy

## 2015-06-24 ENCOUNTER — Encounter: Payer: Self-pay | Admitting: Radiation Oncology

## 2015-06-24 NOTE — Progress Notes (Signed)
Paperwork (STD) received from doctor faxed to [facility] [phone], confirmation received, mailing copy to the patient, 06/24/15 Ardeen Fillers)

## 2015-06-27 ENCOUNTER — Ambulatory Visit: Payer: BLUE CROSS/BLUE SHIELD

## 2015-06-27 DIAGNOSIS — Z9189 Other specified personal risk factors, not elsewhere classified: Secondary | ICD-10-CM

## 2015-06-27 DIAGNOSIS — M25611 Stiffness of right shoulder, not elsewhere classified: Secondary | ICD-10-CM

## 2015-06-27 DIAGNOSIS — R609 Edema, unspecified: Secondary | ICD-10-CM

## 2015-06-27 DIAGNOSIS — C50311 Malignant neoplasm of lower-inner quadrant of right female breast: Secondary | ICD-10-CM

## 2015-06-27 NOTE — Therapy (Signed)
[place], [place], [address] Phone: [phone]   Fax:  [phone]  Physical Therapy Treatment  Patient Details  Name: [name] [name] MRN: [id number] Date of Birth: 1970-11-17 Referring Provider: Dr. Stark Klein  Encounter Date: 06/27/2015      PT End of Session - 06/27/15 0943    Visit Number 10   Number of Visits 16   Date for PT Re-Evaluation 07/19/15   PT Start Time 0852   PT Stop Time 0939   PT Time Calculation (min) 47 min   Activity Tolerance Patient tolerated treatment well   Behavior During Therapy [facility] for tasks assessed/performed      Past Medical History  Diagnosis Date  . Breast cancer ([place])   . Pneumonia     hx of pneumonia 08/2013   . GERD (gastroesophageal reflux disease)     during pregnancy   . Breast cancer ([place])   . Breast cancer, right breast ([place]) 04/14/2015    Past Surgical History  Procedure Laterality Date  . Cesarean section    . Essure tubal ligation    . Cholecystectomy    . Wisdom tooth extraction    . Portacath placement Left 07/21/2014    Procedure: INSERTION PORT-A-CATH;  Surgeon: Stark Klein, MD;  [facility];  Service: General;  Laterality: Left;  . Breast lumpectomy with radioactive seed and sentinel lymph node biopsy Right 03/29/2015    Procedure: BREAST LUMPECTOMY WITH RADIOACTIVE SEED AND SENTINEL LYMPH NODE BIOPSY;  Surgeon: Stark Klein, MD;  Location: [facility];  Service: General;  Laterality: Right;    There were no vitals filed for this visit.  Visit Diagnosis:  At risk for lymphedema  Stiffness of joint, shoulder region, right  Postoperative edema  Carcinoma of lower-inner quadrant of right breast [facility])      Subjective Assessment - 06/27/15 0901    Subjective Looked into exercise program at [facility] and I think I need a little info from you guys to get started but I do want to do that program. I've noticed I  feel like I have a little more energy every day. Skin continues to heal well. Only spot that still bothers me is directly under Rt breast where tissue is still very tender because my breast rubs on that spot all day so its taking longer to heal.   Currently in Pain? No/denies                         [facility] Adult PT Treatment/Exercise - 06/27/15 0001    Knee/Hip Exercises: Aerobic   Stationary Bike Level 1 x 10 minutes and pt with no SOB after today.   Answered pts questions regarding exercises program    Shoulder Exercises: Therapy Ball   Flexion 10 reps   ABduction 5 reps   Manual Therapy   Myofascial Release Right UE myofascial pulling in supine   Passive ROM Right shoulder for ER, abduction, D2, and flexion.to pts tolerance.                        [facility] Goals - 06/20/15 1253    CC Long Term Goal  #2   Title Patient will be independent in a home exercise program for strenght improvement to decrease lymphedema risk with > 5 nodes removed   Status On-going   CC Long Term Goal  #3   Title  Patient will be know how to obtain and use compression garments for prophylaxis   Status On-going   CC Long Term Goal  #4   Title Patient will decrease the DASH score to < 20   to demonstrate increased functional use of upper extremity   Status On-going            Plan - 06/27/15 0944    Clinical Impression Statement Pt able to double time to 10 minutes on bike today with no SOB after where as last week she was SOB after only 5 minutes. Her PROM end range continues to improve as well. Pt very interested in starting [employer] exercise program in next few weeks and hopefully with her mother as well who is also a cancer survivor.     Pt will benefit from skilled therapeutic intervention in order to improve on the following deficits Impaired UE functional use;Decreased activity tolerance;Decreased knowledge of precautions;Impaired perceived  functional ability;Decreased knowledge of use of DME;Decreased strength;Increased edema;Increased fascial restricitons   Rehab Potential Excellent   Clinical Impairments Affecting Rehab Potential months of chemo with some sensory impairment,  radiation therapy    PT Frequency 2x / week   PT Duration 4 weeks   PT Next Visit Plan Assess next visit. Continue gentle stretching and stationary bike or other endurance exercise; progress to strength ABC program instruction as she heals and when appropriate, possibly in next 1-2 sessions as pt is healing well.   Recommended Other Services Will get pt more info regarding [facility]   Consulted and Agree with Plan of Care Patient        Problem List Patient Active Problem List   Diagnosis Date Noted  . Shingles 05/12/2015  . Diarrhea 04/29/2015  . Cancer of lower-inner quadrant of female breast ([place]) 04/14/2015  . 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 Rash 08/17/2014  . Fever 08/04/2014  . Breast cancer 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 ([place]) 07/22/2014  . Family history of breast cancer 07/22/2014  . Lung mas bilaterial 07/22/2014    Otelia Limes, PTA 06/27/2015, 10:07 AM  [place], [place], [address] Phone: [phone]   Fax:  [phone]  Name: [name] [name] MRN: [id number] Date of Birth: March 28, 1971

## 2015-06-28 ENCOUNTER — Encounter: Payer: BLUE CROSS/BLUE SHIELD | Admitting: Physical Therapy

## 2015-06-29 ENCOUNTER — Ambulatory Visit: Payer: BLUE CROSS/BLUE SHIELD | Admitting: Physical Therapy

## 2015-07-04 ENCOUNTER — Ambulatory Visit: Payer: BLUE CROSS/BLUE SHIELD

## 2015-07-04 ENCOUNTER — Ambulatory Visit (HOSPITAL_COMMUNITY)
Admission: RE | Admit: 2015-07-04 | Discharge: 2015-07-04 | Disposition: A | Discharge status: Home / Self Care | Payer: BLUE CROSS/BLUE SHIELD | Source: Ambulatory Visit | Attending: Hematology | Admitting: Hematology

## 2015-07-04 ENCOUNTER — Encounter (HOSPITAL_COMMUNITY): Payer: Self-pay

## 2015-07-04 ENCOUNTER — Telehealth: Payer: Self-pay | Admitting: Hematology

## 2015-07-04 DIAGNOSIS — R918 Other nonspecific abnormal finding of lung field: Secondary | ICD-10-CM | POA: Diagnosis not present

## 2015-07-04 DIAGNOSIS — Z923 Personal history of irradiation: Secondary | ICD-10-CM | POA: Diagnosis not present

## 2015-07-04 DIAGNOSIS — C7800 Secondary malignant neoplasm of unspecified lung: Secondary | ICD-10-CM | POA: Insufficient documentation

## 2015-07-04 DIAGNOSIS — K429 Umbilical hernia without obstruction or gangrene: Secondary | ICD-10-CM | POA: Diagnosis not present

## 2015-07-04 DIAGNOSIS — C50911 Malignant neoplasm of unspecified site of right female breast: Secondary | ICD-10-CM | POA: Insufficient documentation

## 2015-07-04 DIAGNOSIS — Z9049 Acquired absence of other specified parts of digestive tract: Secondary | ICD-10-CM | POA: Insufficient documentation

## 2015-07-04 DIAGNOSIS — Z79899 Other long term (current) drug therapy: Secondary | ICD-10-CM | POA: Insufficient documentation

## 2015-07-04 MED ORDER — IOHEXOL 300 MG/ML  SOLN: 25.0000 mL | Freq: Once | INTRAMUSCULAR | Status: DC | PRN

## 2015-07-04 MED ORDER — IOHEXOL 300 MG/ML  SOLN
100.0000 mL | Freq: Once | INTRAMUSCULAR | Status: AC | PRN
  Administered 2015-07-04: 09:00:00 via INTRAVENOUS

## 2015-07-04 NOTE — Telephone Encounter (Signed)
pt cld to add flush appts-cld and left pt a message of time for 11/22 appt & 12/13

## 2015-07-05 ENCOUNTER — Other Ambulatory Visit: Payer: BLUE CROSS/BLUE SHIELD

## 2015-07-05 ENCOUNTER — Other Ambulatory Visit (HOSPITAL_BASED_OUTPATIENT_CLINIC_OR_DEPARTMENT_OTHER): Payer: BLUE CROSS/BLUE SHIELD

## 2015-07-05 ENCOUNTER — Encounter: Payer: Self-pay | Admitting: Hematology

## 2015-07-05 ENCOUNTER — Ambulatory Visit (HOSPITAL_BASED_OUTPATIENT_CLINIC_OR_DEPARTMENT_OTHER): Payer: BLUE CROSS/BLUE SHIELD | Admitting: Hematology

## 2015-07-05 ENCOUNTER — Ambulatory Visit (HOSPITAL_BASED_OUTPATIENT_CLINIC_OR_DEPARTMENT_OTHER): Payer: BLUE CROSS/BLUE SHIELD

## 2015-07-05 ENCOUNTER — Ambulatory Visit: Payer: BLUE CROSS/BLUE SHIELD

## 2015-07-05 VITALS — BP 120/84 | HR 87 | Temp 98.0°F | Resp 18 | Ht 70.0 in | Wt 212.8 lb

## 2015-07-05 DIAGNOSIS — C7800 Secondary malignant neoplasm of unspecified lung: Secondary | ICD-10-CM | POA: Diagnosis not present

## 2015-07-05 DIAGNOSIS — Z5112 Encounter for antineoplastic immunotherapy: Secondary | ICD-10-CM

## 2015-07-05 DIAGNOSIS — M25611 Stiffness of right shoulder, not elsewhere classified: Secondary | ICD-10-CM

## 2015-07-05 DIAGNOSIS — Z9189 Other specified personal risk factors, not elsewhere classified: Secondary | ICD-10-CM | POA: Diagnosis not present

## 2015-07-05 DIAGNOSIS — C50311 Malignant neoplasm of lower-inner quadrant of right female breast: Secondary | ICD-10-CM

## 2015-07-05 DIAGNOSIS — C50911 Malignant neoplasm of unspecified site of right female breast: Secondary | ICD-10-CM

## 2015-07-05 DIAGNOSIS — R609 Edema, unspecified: Secondary | ICD-10-CM

## 2015-07-05 LAB — COMPREHENSIVE METABOLIC PANEL (CC13)
ALBUMIN: 3.9 g/dL (ref 3.5–5.0)
ALK PHOS: 86 U/L (ref 40–150)
ALT: 27 U/L (ref 0–55)
AST: 25 U/L (ref 5–34)
Anion Gap: 11 mEq/L (ref 3–11)
BUN: 14.7 mg/dL (ref 7.0–26.0)
CO2: 27 mEq/L (ref 22–29)
Calcium: 9.9 mg/dL (ref 8.4–10.4)
Chloride: 104 mEq/L (ref 98–109)
Creatinine: 0.9 mg/dL (ref 0.6–1.1)
EGFR: >90 mL/min/{1.73_m2} (ref >90)
GLUCOSE: 114 mg/dL (ref 70–140)
POTASSIUM: 4.1 meq/L (ref 3.5–5.1)
SODIUM: 141 meq/L (ref 136–145)
Total Bilirubin: 0.48 mg/dL (ref 0.20–1.20)
Total Protein: 8.1 g/dL (ref 6.4–8.3)

## 2015-07-05 LAB — CBC WITH DIFFERENTIAL/PLATELET
BASO%: 0.4 % (ref 0.0–2.0)
BASOS ABS: 0 10*3/uL (ref 0.0–0.1)
EOS ABS: 0.2 10*3/uL (ref 0.0–0.5)
EOS%: 3.1 % (ref 0.0–7.0)
HCT: 40.9 % (ref 34.8–46.6)
HGB: 13.2 g/dL (ref 11.6–15.9)
LYMPH%: 22.1 % (ref 14.0–49.7)
MCH: 27.8 pg (ref 25.1–34.0)
MCHC: 32.3 g/dL (ref 31.5–36.0)
MCV: 86.1 fL (ref 79.5–101.0)
MONO#: 0.4 10*3/uL (ref 0.1–0.9)
MONO%: 7.5 % (ref 0.0–14.0)
NEUT#: 3.3 10*3/uL (ref 1.5–6.5)
NEUT%: 66.9 % (ref 38.4–76.8)
Platelets: 212 10*3/uL (ref 145–400)
RBC: 4.75 10*6/uL (ref 3.70–5.45)
RDW: 14.9 % — ABNORMAL HIGH (ref 11.2–14.5)
WBC: 4.9 10*3/uL (ref 3.9–10.3)
lymph#: 1.1 10*3/uL (ref 0.9–3.3)

## 2015-07-05 MED ORDER — TRASTUZUMAB CHEMO INJECTION 440 MG
6.0000 mg/kg | Freq: Once | INTRAVENOUS | Status: AC
  Administered 2015-07-05: 567 mg via INTRAVENOUS
  Filled 2015-07-05: qty 27

## 2015-07-05 MED ORDER — SODIUM CHLORIDE 0.9 % IJ SOLN
10.0000 mL | INTRAMUSCULAR | Status: DC | PRN
  Administered 2015-07-05: 10 mL
  Filled 2015-07-05: qty 10

## 2015-07-05 MED ORDER — SODIUM CHLORIDE 0.9 % IV SOLN
Freq: Once | INTRAVENOUS | Status: AC
  Administered 2015-07-05: 14:00:00 via INTRAVENOUS

## 2015-07-05 MED ORDER — ACETAMINOPHEN 325 MG PO TABS: 650.0000 mg | ORAL_TABLET | Freq: Once | ORAL | Status: DC

## 2015-07-05 MED ORDER — HEPARIN SOD (PORK) LOCK FLUSH 100 UNIT/ML IV SOLN
500.0000 [IU] | Freq: Once | INTRAVENOUS | Status: AC | PRN
  Administered 2015-07-05: 500 [IU]
  Filled 2015-07-05: qty 5

## 2015-07-05 NOTE — Progress Notes (Signed)
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 MRI breast on 07/16/2014 showed 7cm R breast lesion and 4.4cm r axillary node    07/02/2014 Imaging CT CAP: a 4.7cm mass in LUL lung and a 2.1cm mas in RML, and a small nodule in RUL, suspecious for metastasis     07/09/2014 Initial Diagnosis right IDA with b/l lung lesions, ER-/PR-/HER2+   07/09/2014 Initial Biopsy US guided right breast mass and axillary node biopsy showed IDA, and DCIS, ER-/PR-/HER2+   07/26/2014 Pathologic Stage Left lung mass by IR, path revealed high grade carcinoma, morphology similar to breast tumor biopsy, TTF(-), NapsinA(-), ER(-)   08/04/2014 - 03/22/2015 Chemotherapy weekly Paclitaxel 59m/m2, trastuzumab and pertuzumab every 3 weeks   10/04/2014 Imaging Interval decrease in the right axillary lymphadenopathy. Bilateral pulmonary lesions with left hilar lymphadenopathy also markedly decreased in the interval. The left hilar lymphadenopathy has resolved.   12/20/2014 Imaging restaging CT showed stable disease, no new lesions    03/29/2015 Pathology Results  right breast lumpectomy showed  chemotherapy treatment effect,  a 1 mm residual tumor,   margins were widely negative, 5 sentinel lymph nodes and 2  axillary lymph nodes were negative.    03/29/2015 Surgery  right breast lumpectomy and sentinel lymph node biopsy  by Dr. BBarry Dienes  04/12/2015 -  Chemotherapy  Herceptin maintenance therapy , 6 mg/kg, every 3 weeks   05/03/2015 - 06/14/2015 Radiation Therapy right breast adjuvant irradiation by Dr. MValere Dross    CURRENT THERAPY: Breast irradiation, started on 05/03/2015. Herceptin maintenance therapy every 3 weeks, started on 04/12/2015  INTERVAL HISTROY   NMarriannereturns for follow-up and discuss restaging scan. She is doing well overall, denies any pain, cough, dyspnea or other symptoms. She has good appetite and energy level, she is planning to return to work in Jan 2017.   MEDICAL HISTORY:  Past Medical History  Diagnosis Date  . Breast cancer ([facility]   . Pneumonia     hx of pneumonia 08/2013   . GERD (gastroesophageal reflux disease)     during pregnancy   . Breast cancer ([facility]   . Breast cancer, right breast ([facility] 04/14/2015    SURGICAL HISTORY: Past Surgical History  Procedure Laterality Date  . Cesarean section    . Essure tubal ligation    . Cholecystectomy    . Wisdom tooth extraction    . Portacath placement Left 07/21/2014    Procedure: INSERTION PORT-A-CATH;  Surgeon: FStark Klein MD;  Location: [facility];  Service: General;  Laterality: Left;  . Breast lumpectomy with radioactive seed and sentinel lymph node biopsy Right 03/29/2015    Procedure: BREAST LUMPECTOMY WITH RADIOACTIVE SEED AND SENTINEL LYMPH NODE BIOPSY;  Surgeon: FStark Klein MD;  [place] [facility]  Service: General;  Laterality: Right;    SOCIAL HISTORY: History   Social History  . Marital Status: Married    Spouse Name: N/A    Number of Children:  she has 2 daughters at the age of 30 and 64.   . Years of Education: N/A   Occupational History  .  works as Freight forwarder for a [employer].    Social History Main Topics  . Smoking status: Never Smoker   . Smokeless tobacco: Not on file  .  Alcohol Use: Yes  . Drug Use: No  . Sexual Activity: No    FAMILY HISTORY: Family History  Problem Relation Age of Onset  . Breast cancer Mother 73  . Liver cancer Father 8  . Breast cancer Maternal Aunt 36  . Prostate cancer Maternal Grandfather   . Liver cancer Paternal Grandmother   . Prostate cancer Paternal Grandfather      GENETICS: 08/30/2014 BreastNext panel was negative. 17 genes including BRCA1, BRCA2, were negative for mutations.   ALLERGIES:  is allergic to adhesive; aspirin; and codeine.  MEDICATIONS:  Current Outpatient Prescriptions  Medication Sig Dispense Refill  . Camphor-Eucalyptus-Menthol (VICKS VAPORUB EX) Apply 1 application topically at bedtime. Applies under nose, throat and on chest.    . cetirizine (ZYRTEC) 10 MG tablet Take 10 mg by mouth daily as needed for allergies (allergies).     . clindamycin (CLINDAGEL) 1 % gel Apply topically to affected areas BID. 60 g 3  . hyaluronate sodium (RADIAPLEXRX) GEL Apply 1 application topically 2 (two) times daily.    . hydrocortisone 2.5 % cream APPLY TOPICALLY TO THE AFFECTED AREA TWICE DAILY 454 g 0  . lidocaine-prilocaine (EMLA) cream Apply topically as needed. 30 g 2  . loperamide (IMODIUM A-D) 2 MG tablet Take 1 tablet (2 mg total) by mouth 4 (four) times daily as needed for diarrhea or loose stools. 30 tablet 0  . LORazepam (ATIVAN) 1 MG tablet Take 1 tablet (1 mg total) by mouth once. 2 tablet 0  . non-metallic deodorant (ALRA) MISC Apply 1 application topically daily as needed.    . ondansetron (ZOFRAN) 8 MG tablet Take 1 tablet (8 mg total) by mouth every 8 (eight) hours as needed for nausea or vomiting. 30 tablet 3  . oxyCODONE-acetaminophen (ROXICET) 5-325 MG per tablet Take 1-2 tablets by mouth every 4 (four) hours as needed for severe pain. 30 tablet 0  . pantoprazole (PROTONIX) 20 MG tablet Take 1 tablet (20 mg total) by mouth daily. 30 tablet 3  . Pseudoeph-Doxylamine-DM-APAP (NYQUIL PO) Take 30 mLs by  mouth 2 (two) times daily as needed (cold/allergies).      No current facility-administered medications for this visit.    REVIEW OF SYSTEMS:   Constitutional: Denies fevers, chills or abnormal night sweats, (+) malaise  Eyes: Denies blurriness of vision, double vision or watery eyes Ears, nose, mouth, throat, and face: Denies mucositis or sore throat Respiratory: (+) productive cough, no dyspnea or wheezes Cardiovascular: Denies palpitation, chest discomfort or lower extremity swelling Gastrointestinal:  Denies nausea, heartburn or change in bowel habits Skin:(+)  skin pigmentation on bilateral forearms and left elbow Lymphatics: Denies new lymphadenopathy or easy bruising Neurological:Denies numbness, tingling or new weaknesses Behavioral/Psych: Mood is stable, no new changes  All other systems were reviewed with the patient and are negative.  PHYSICAL EXAMINATION: ECOG PERFORMANCE STATUS: 1 BP 120/84 mmHg  Pulse 87  Temp(Src) 98 F (36.7 C) (Oral)  Resp 18  Ht 5' 10"  (1.778  m)  Wt 212 lb 12.8 oz (96.525 kg)  BMI 30.53 kg/m2  SpO2 100% GENERAL:alert, no distress and comfortable SKIN: skin color, texture, turgor are normal, no rashes or significant lesions EYES: normal, conjunctiva are pink and non-injected, sclera clear OROPHARYNX:no exudate, no erythema and lips, buccal mucosa, and tongue normal  NECK: supple, thyroid normal size, non-tender, without nodularity LYMPH:  no palpable lymphadenopathy in the cervical, axillary or inguinal LUNGS: clear to auscultation and percussion with normal breathing effort HEART: regular rate & rhythm and no murmurs and no lower extremity edema ABDOMEN:abdomen soft, non-tender and normal bowel sounds Musculoskeletal:no cyanosis of digits and no clubbing  PSYCH: alert & oriented x 3 with fluent speech NEURO: no focal motor/sensory deficits Breasts:  Status post right breast lumpectomy and sentinel lymph node biopsy, incisions have healed  well. There is a 5cm lump above her breast incision site, non-tender. (+) Moderate skin pigmentation of right breast secondary to radiation, previous skin ulcers have healed. lcers under her right breast. Palpation of left breast  and axilla revealed no other obvious mass that I could appreciate. Skin: (+) Skin pigmentation on hands and feet, lighter than before. No skin rashes. (+)  pigmentation on fingernails, and bulging of fingernails and thickening of soft tissue under nailbed.  LABORATORY DATA:  I have reviewed the data as listed CBC Latest Ref Rng 07/05/2015 06/14/2015 05/24/2015  WBC 3.9 - 10.3 10e3/uL 4.9 4.6 5.8  Hemoglobin 11.6 - 15.9 g/dL 13.2 11.7 11.9  Hematocrit 34.8 - 46.6 % 40.9 35.6 36.7  Platelets 145 - 400 10e3/uL 212 206 236    CMP Latest Ref Rng 07/05/2015 06/14/2015 05/24/2015  Glucose 70 - 140 mg/dl 114 100 74  BUN 7.0 - 26.0 mg/dL 14.7 8.7 11.0  Creatinine 0.6 - 1.1 mg/dL 0.9 0.7 0.7  Sodium 136 - 145 mEq/L 141 141 139  Potassium 3.5 - 5.1 mEq/L 4.1 3.8 4.2  Chloride 96 - 112 mmol/L - - -  CO2 22 - 29 mEq/L 27 28 25   Calcium 8.4 - 10.4 mg/dL 9.9 9.1 9.4  Total Protein 6.4 - 8.3 g/dL 8.1 7.1 7.6  Total Bilirubin 0.20 - 1.20 mg/dL 0.48 <0.30 0.47  Alkaline Phos 40 - 150 U/L 86 66 69  AST 5 - 34 U/L 25 17 18   ALT 0 - 55 U/L 27 15 15      PATHOLOGY REPORT 07/09/2014 #1 breast, right needle core biopsy more anterior -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 45)  Lung, biopsy, Left 07/26/2014 - HIGH GRADE CARCINOMA, SEE COMMENT. Microscopic Comment The carcinoma demonstrates the following immunophenotype: TTF-1 - negative expression. Napsin A- negative expression. CK5/6 - focal, moderate expression. estrogen receptor - negative  expression. GCDFP- negative expression. The recent breast biopsy demonstrating Her2 amplified high grade invasive mammary carcinoma is noted ([id number]). Although the immunophenotype of the current case is non-sepcific, it strongly argues against primary lung adenocarcinoma. However, on re-review, the morphology of the current case is essentially identical to the primary mammary carcinoma. In lieu of further immunophenotyping, the tumor will be submitted for Her2 testing and the remaining tissue will be reserved for additional ancillary tumor testing. The results of the Her2 testing will be reported in an addendum. The case was discussed with Dr Burr Medico on 07/28/2015 and 07/29/2015 HER2 POSITIVE   Diagnosis 03/29/2015  1. Breast, lumpectomy, Right - INVASIVE DUCTAL CARCINOMA, SEE COMMENT. - SEE TUMOR SYNOPTIC TEMPLATE BELOW. 2. Lymph node, sentinel, biopsy, Right axillary #1 - ONE LYMPH NODE, NEGATIVE FOR TUMOR (0/1). - SEE COMMENT. 3. Lymph node, sentinel, biopsy, Right axillary #2 - BENIGN FIBROFATTY SOFT TISSUE. - NEGATIVE FOR ATYPIA OR MALIGNANCY. - NEGATIVE FOR LYMPH NODE. 4. Lymph node, sentinel, biopsy, Right axillary #3 - ONE LYMPH NODE, NEGATIVE FOR TUMOR (0/1). - SEE COMMENT. 5. Lymph node, sentinel, biopsy, Right axillary #4 - ONE LYMPH NODE, NEGATIVE FOR TUMOR (0/1). - SEE COMMENT. 6. Lymph node, sentinel, biopsy, Right axillary #5 - ONE LYMPH NODE, NEGATIVE FOR TUMOR (0/1). - SEE COMMENT. 7. Lymph node, sentinel, biopsy, right axillary - ONE LYMPH NODE, NEGATIVE FOR TUMOR (0/1). - SEE COMMENT. 8. Lymph node, sentinel, biopsy, right axillary - ONE LYMPH NODE, NEGATIVE FOR TUMOR (0/1). - SEE COMMENT. 1 of 4 Amended copy Amended FINAL for CHELLSEA, BECKERS ([id number]) Microscopic Comment 1. BREAST, INVASIVE TUMOR, WITH LYMPH NODES PRESENT Specimen, including laterality and lymph node sampling (sentinel, non-sentinel): Right breast with sentinel and non-sentinel  lymph node sampling Procedure: Lumpectomy Histologic type: Ductal, see comment Grade: 2 of 3, see comment. Tubule formation: 3 Nuclear pleomorphism: 3 Mitotic: 1 Tumor size (glass slide measurement): 1 mm, see comment Margins: Invasive, distance to closest margin: See comment In-situ, distance to closest margin: N/A If margin positive, focally or broadly: N/A Lymphovascular invasion: Absent Ductal carcinoma in situ: Absent Grade: N/A Extensive intraductal component: N/A Lobular neoplasia: Absent Tumor focality: Unifocal, see comment Treatment effect: Present If present, treatment effect in breast tissue, lymph nodes or both: Both breast tissue and lymph nodes Extent of tumor: Skin: N/A Nipple: N/A Skeletal muscle: N/A Lymph nodes: Examined: 5 Sentinel 2 Non-sentinel 7 Total Lymph nodes with metastasis: 0 Isolated tumor cells (< 0.2 mm): N/A Micrometastasis: (> 0.2 mm and < 2.0 mm): N/A Macrometastasis: (> 2.0 mm): N/A Extracapsular extension: N/A Breast prognostic profile: Estrogen receptor: Not repeated, previous study demonstrated 0% positivity ([id number]). Progesterone receptor: Not repeated, previous study demonstrated 0% positivity ([id number]) Her 2 neu: Demonstrates amplificaition ([id number]) Ki-67: Not repeated, previous study demonstrate 79% proliferation rate ([id number]). Non-neoplastic breast: Neoadjuvant related tissue changes and adenosis. TNM: ypT69m, ypN0, pMX Comments: Numerous representative sections including the 2.0 cm hemorrhagic tissue associated with X-shaped biopsy clip, the 2.0 cm ill defined lesion associated with M-shaped clip, and intervening tissue were submitted for review. Slide sections from the tissue surrounding the X-shaped and M-shaped clips demonstrate tissue changes consistent with neoadjuvant related change. There is no invasive or in situ carcinom present at either site. However, in a representative section from the intervening  tissue (slide G), there is a 1 mm focus of high grade invasive ductal carcinoma present. Given the minute size of the tumor, tumor grading is limited. The tumor is present within non-marginal tissue sections and is considered to be at least 0.5 cm from the nearest margin (medial).   Note: Amendment issued for modification of synoptic table, inadvertent typographical error. The change in the synoptic table was discussed with Dr FBurr Medicoon 04/07/2015. (CRR:ecj 04/07/2015) 2. , 4-8. In parts 2 and 4, there is extensive neoadjuvant related tissue changes including abundant foamy macrophages, fibroinflammatory reaction and dystrophic calcifications. The neoadjuvant tissue changes extend into perinodal soft tissue and are either focally or broadly present at the cauterized edge of the tissue submitted. There are no definitive features of malignancy present.  RADIOGRAPHIC STUDIES:  PET scan 02/21/2015  IMPRESSION: 1.  No evidence of metabolically active breast cancer metastasis. 2. Stable parenchymal thickening in the upper lobes at site of prior pulmonary masses. These stable pulmonary parenchymal thicken does not have associated metabolic activity.  CT chest, abdomen and pelvis with contrast 07/04/2015 IMPRESSION: Status post right breast lumpectomy with right axillary lymph node dissection. Postoperative seroma in the lower/inner right breast.  Suspected treated metastases in the left upper lobe and posterior right middle lobe, non FDG avid on prior PET, stable versus mildly improved.  8 x 14 mm subpleural nodule in the posterior right upper lobe, new from prior studies, indeterminate. Attention on follow-up is suggested.  No evidence of metastatic disease in the abdomen/pelvis.  ECHO 06/09/2015 Study Conclusion - Left ventricle: The cavity size was normal. Global longitudinal strain -21.8%. Systolic function was vigorous. The estimated ejection fraction was in the range of 65%  to 70%. Wall motion was normal; there were no regional wall motion abnormalities. Left ventricular diastolic function parameters were normal. - Aortic valve: Transvalvular velocity was within the normal range. There was no stenosis. There was no regurgitation. - Mitral valve: There was mild regurgitation. - Right ventricle: The cavity size was normal. Wall thickness was normal. Systolic function was normal. - Tricuspid valve: There was no regurgitation. - Pulmonic valve: There was trivial regurgitation.   ASSESSMENT & PLAN:  44 year old African-American female, premenopausal, without significant past medical history, presented with palpable right breast mass and right axillary mass.   1. 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 - I reviewed her breasts surgical pathology results with her in great detail ,  She has had fantastic partial response (near complete response) form 8 months of chemotherapy and antibody therapy.   The initial 6.3 cm breast mass has only 1 mm residual tumor,  And the previously 4.4 cm right axillary lymph node has had complete  Pathological response, all 7 nodes were negative for cancer. -I reviewed her restaging CT scans, no evidence of new lesion except an indeterminate RUL subpleural nodule which is new. The right breast lump is a seroma. -I recommend to have a PET in 2 month for close follow up of the RUL lung nodule. I think the lesion is too small to have biopsy, and not concern enough to change treatment for now. She is concerned about disease progression, but agree with the plan.  -continue herceptin for now, lab reviewed, CBC, CMP normal   -repeated echo was normal   2. Right breast mass at incision site -This is a seroma from her breast surgery,  confirmed by CT   3. G1 peripheral neuropathy, skin pigmentation, hands and feet -improving. We'll continue to monitor closely.  4. History of shingle infection in upper abdomen -no postherpatic pain   Plan -Continue Herceptin every 3 weeks, treatment today. -I'll see her back in 3 weeks      I spent about 20 minutes counseling the patient and her family members, total care was about 25 minutes.  [name]  07/05/2015

## 2015-07-05 NOTE — Patient Instructions (Signed)
[facility] Discharge Instructions for Patients Receiving Chemotherapy  Today you received the following chemotherapy agents: Herceptin   To help prevent nausea and vomiting after your treatment, we encourage you to take your nausea medication as directed.    If you develop nausea and vomiting that is not controlled by your nausea medication, call the clinic.   BELOW ARE SYMPTOMS THAT SHOULD BE REPORTED IMMEDIATELY:  *FEVER GREATER THAN 100.5 F  *CHILLS WITH OR WITHOUT FEVER  NAUSEA AND VOMITING THAT IS NOT CONTROLLED WITH YOUR NAUSEA MEDICATION  *UNUSUAL SHORTNESS OF BREATH  *UNUSUAL BRUISING OR BLEEDING  TENDERNESS IN MOUTH AND THROAT WITH OR WITHOUT PRESENCE OF ULCERS  *URINARY PROBLEMS  *BOWEL PROBLEMS  UNUSUAL RASH Items with * indicate a potential emergency and should be followed up as soon as possible.  Feel free to call the clinic you have any questions or concerns. The clinic phone number is [phone].  Please show the CHEMO ALERT CARD at check-in to the Emergency Department and triage nurse.   

## 2015-07-05 NOTE — Therapy (Signed)
[place], [place], [address] Phone: [phone]   Fax:  [phone]  Physical Therapy Treatment  Patient Details  Name: [name] [name] MRN: [id number] Date of Birth: 1971/04/17 Referring Provider: Dr. Stark Klein  Encounter Date: 07/05/2015      PT End of Session - 07/05/15 0855    Visit Number 11   Number of Visits 16   Date for PT Re-Evaluation 07/19/15   PT Start Time 0810   PT Stop Time 0855   PT Time Calculation (min) 45 min   Activity Tolerance Patient tolerated treatment well   Behavior During Therapy [facility] for tasks assessed/performed      Past Medical History  Diagnosis Date  . Breast cancer ([place])   . Pneumonia     hx of pneumonia 08/2013   . GERD (gastroesophageal reflux disease)     during pregnancy   . Breast cancer ([place])   . Breast cancer, right breast ([place]) 04/14/2015    Past Surgical History  Procedure Laterality Date  . Cesarean section    . Essure tubal ligation    . Cholecystectomy    . Wisdom tooth extraction    . Portacath placement Left 07/21/2014    Procedure: INSERTION PORT-A-CATH;  Surgeon: Stark Klein, MD;  [facility];  Service: General;  Laterality: Left;  . Breast lumpectomy with radioactive seed and sentinel lymph node biopsy Right 03/29/2015    Procedure: BREAST LUMPECTOMY WITH RADIOACTIVE SEED AND SENTINEL LYMPH NODE BIOPSY;  Surgeon: Stark Klein, MD;  Location: [facility];  Service: General;  Laterality: Right;    There were no vitals filed for this visit.  Visit Diagnosis:  At risk for lymphedema  Stiffness of joint, shoulder region, right  Postoperative edema  Carcinoma of lower-inner quadrant of right breast [facility])      Subjective Assessment - 07/05/15 0814    Subjective I felt like I had extra energy on Tuesday last week so did a little more than usual and that's why I didn't make it Wednesday, I think I did too much. My  skin is healing really well on my Rt side, my Lt side under my breast area though is still really tender and itchy from where they did the echocardiogram. It's not going ot work for me to go to [employer] we are traveling for the holidays and then I'll start back to work at the [employer] so [place] only have 2-3 weeks to do it.    Currently in Pain? No/denies                         [facility] Adult PT Treatment/Exercise - 07/05/15 0001    Shoulder Exercises: ROM/Strengthening   Other ROM/Strengthening Exercises All Stretches from Strength ABC Packet 1 rep holding for 15 seconds each   Other ROM/Strengthening Exercises All Strength exercises from Strength ABC Packet 10 reps each except 3 reps each for Superwoman and 7 reps for Lt one armed row.                 PT Education - 07/05/15 0855    Education provided Yes   Education Details Strength ABC Packet and importance of doing only 2-3x/week due to lymphedema risk.   Person(s) Educated Patient   Methods Demonstration;Explanation   Comprehension Verbalized understanding;Returned demonstration                Long Term  Clinic Goals - 07/05/15 1003    CC Long Term Goal  #2   Title Patient will be independent in a home exercise program for strength improvement to decrease lymphedema risk with > 5 nodes removed  Issued Strength ABC Program to pt with instruction today   Status Partially Met   CC Long Term Goal  #3   Title Patient will be know how to obtain and use compression garments for prophylaxis  Issued order for pt to give to her doctor to sign at her appt today for compression garments and info to get at either [facility] or [facility], pts choice.   Status Partially Met   CC Long Term Goal  #4   Title Patient will decrease the DASH score to < 20   to demonstrate increased functional use of upper extremity   Status On-going            Plan - 07/05/15 0856    Clinical Impression  Statement Pt instructed in Strenght ABC Program today and she did very well with returning all demonstrations. Cuing she required the most was for correct posture by engaging abdominals, something she reports she knows she needs to work on. Pt has 1 visit this week and 1 next week and possible discharge after that. She goes back to work at first of the year.    Pt will benefit from skilled therapeutic intervention in order to improve on the following deficits Impaired UE functional use;Decreased activity tolerance;Decreased knowledge of precautions;Impaired perceived functional ability;Decreased knowledge of use of DME;Decreased strength;Increased edema;Increased fascial restricitons   Rehab Potential Excellent   Clinical Impairments Affecting Rehab Potential months of chemo with some sensory impairment,  radiation therapy    PT Frequency 2x / week   PT Duration 4 weeks   PT Treatment/Interventions Manual techniques;Therapeutic exercise   PT Next Visit Plan Assess next visit and have pt retake the DASH for goal reassess; review Strength ABC Program prn. Discharge next 1-2 visits possibly?   Recommended Other Services Issued scipt for pt to have her doctor sign at her appt today for compression garments class I and instructed her to call [facility] or [facility] to see if either will help her use her [employer].   Consulted and Agree with Plan of Care Patient        Problem List Patient Active Problem List   Diagnosis Date Noted  . Shingles 05/12/2015  . Diarrhea 04/29/2015  . Cancer of lower-inner quadrant of female breast ([place]) 04/14/2015  . 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 Rash 08/17/2014  . Fever 08/04/2014  . Breast cancer 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 ([place]) 07/22/2014  . Family history of breast cancer 07/22/2014  . Lung mas bilaterial 07/22/2014    Otelia Limes, PTA 07/05/2015, 10:08  AM  [place], [place], [address] Phone: [phone]   Fax:  [phone]  Name: [name] [name] MRN: [id number] Date of Birth: Apr 24, 1971

## 2015-07-05 NOTE — Progress Notes (Signed)
Patient had discussed with her MD today about not taking the pre medications today.  She has declined Benadryl and Tylenol.

## 2015-07-06 ENCOUNTER — Ambulatory Visit: Payer: BLUE CROSS/BLUE SHIELD

## 2015-07-06 ENCOUNTER — Telehealth: Payer: Self-pay | Admitting: Hematology

## 2015-07-06 NOTE — Telephone Encounter (Signed)
per pof to sch appt-cld pt and adv of time & date of appt-pt understood 12/13 appt

## 2015-07-11 ENCOUNTER — Ambulatory Visit: Payer: BLUE CROSS/BLUE SHIELD | Admitting: Hematology

## 2015-07-11 ENCOUNTER — Ambulatory Visit: Payer: BLUE CROSS/BLUE SHIELD

## 2015-07-11 ENCOUNTER — Encounter: Payer: Self-pay | Admitting: Radiation Oncology

## 2015-07-11 DIAGNOSIS — M25611 Stiffness of right shoulder, not elsewhere classified: Secondary | ICD-10-CM

## 2015-07-11 DIAGNOSIS — R609 Edema, unspecified: Secondary | ICD-10-CM

## 2015-07-11 DIAGNOSIS — Z9189 Other specified personal risk factors, not elsewhere classified: Secondary | ICD-10-CM

## 2015-07-11 NOTE — Therapy (Signed)
[place], [place], [address] Phone: [phone]   Fax:  [phone]  Physical Therapy Treatment  Patient Details  Name: [name] [name] MRN: [id number] Date of Birth: 12-Sep-1970 Referring Provider: Dr. Stark Klein  Encounter Date: 07/11/2015      PT End of Session - 07/11/15 0933    Visit Number 12   Number of Visits 16   Date for PT Re-Evaluation 07/19/15   PT Start Time 0847   PT Stop Time 0933   PT Time Calculation (min) 46 min   Activity Tolerance Patient tolerated treatment well   Behavior During Therapy [facility] for tasks assessed/performed      Past Medical History  Diagnosis Date  . Breast cancer ([place])   . Pneumonia     hx of pneumonia 08/2013   . GERD (gastroesophageal reflux disease)     during pregnancy   . Breast cancer ([place])   . Breast cancer, right breast ([place]) 04/14/2015    Past Surgical History  Procedure Laterality Date  . Cesarean section    . Essure tubal ligation    . Cholecystectomy    . Wisdom tooth extraction    . Portacath placement Left 07/21/2014    Procedure: INSERTION PORT-A-CATH;  Surgeon: Stark Klein, MD;  [facility];  Service: General;  Laterality: Left;  . Breast lumpectomy with radioactive seed and sentinel lymph node biopsy Right 03/29/2015    Procedure: BREAST LUMPECTOMY WITH RADIOACTIVE SEED AND SENTINEL LYMPH NODE BIOPSY;  Surgeon: Stark Klein, MD;  Location: [facility];  Service: General;  Laterality: Right;    There were no vitals filed for this visit.  Visit Diagnosis:  At risk for lymphedema  Postoperative edema  Stiffness of joint, shoulder region, right      Subjective Assessment - 07/11/15 0853    Subjective I did the stretches from the packet since I was here last and they went well, didn;t do the strength portion yet. My whole Rt shoulder area front and back has been bothering me since about Thursday. It's just been  feeling tight and sore, I think I just have been sleeping on that side wrong.   Currently in Pain? No/denies                  Katina Dung - 07/11/15 0001    Open a tight or new jar No difficulty   Do heavy household chores (wash walls, wash floors) Mild difficulty   Carry a shopping bag or briefcase No difficulty   Wash your back No difficulty   Use a knife to cut food No difficulty   Recreational activities in which you take some force or impact through your arm, shoulder, or hand (golf, hammering, tennis) Moderate difficulty   During the past week, to what extent has your arm, shoulder or hand problem interfered with your normal social activities with family, friends, neighbors, or groups? Not at all   During the past week, to what extent has your arm, shoulder or hand problem limited your work or other regular daily activities Not at all   Arm, shoulder, or hand pain. None   Tingling (pins and needles) in your arm, shoulder, or hand Mild   Difficulty Sleeping No difficulty   DASH Score 9.09 %               [facility] Adult PT Treatment/Exercise - 07/11/15 0001    Manual Therapy   Soft tissue mobilization  To anterior and posterior Rt shoulder musculature   Myofascial Release Right UE myofascial pulling in supine   Passive ROM Right shoulder for ER, abduction, D2, and flexion.to pts tolerance.                        [facility] Goals - 07/11/15 0856    CC Long Term Goal  #1   Title Patient with verbalize an understanding of lymphedema risk reduction precautions   Status Achieved   CC Long Term Goal  #2   Title Patient will be independent in a home exercise program for strength improvement to decrease lymphedema risk with > 5 nodes removed  Pt has done this once since last visit and only partially.   Status Partially Met   CC Long Term Goal  #3   Title Patient will be know how to obtain and use compression garments for prophylaxis  Pt now has  signed order and reports will call to get measured soon, hopefully within the next few days.   Status Partially Met   CC Long Term Goal  #4   Title Patient will decrease the DASH score to < 20   to demonstrate increased functional use of upper extremity  Pt scored 9.09 today 07/11/15   Status Achieved            Plan - 07/11/15 0933    Clinical Impression Statement Pt came in reporting very tight at Rt shoulder area so focused on this today. Pt has 1 more visit tomorrow.    Pt will benefit from skilled therapeutic intervention in order to improve on the following deficits Impaired UE functional use;Decreased activity tolerance;Decreased knowledge of precautions;Impaired perceived functional ability;Decreased knowledge of use of DME;Decreased strength;Increased edema;Increased fascial restricitons   Rehab Potential Excellent   Clinical Impairments Affecting Rehab Potential months of chemo with some sensory impairment,  radiation therapy    PT Frequency 2x / week   PT Duration 4 weeks   PT Treatment/Interventions Manual techniques;Therapeutic exercise   PT Next Visit Plan Discharge next visit and remeasure ROM.   Consulted and Agree with Plan of Care Patient        Problem List Patient Active Problem List   Diagnosis Date Noted  . Shingles 05/12/2015  . Diarrhea 04/29/2015  . Cancer of lower-inner quadrant of female breast ([place]) 04/14/2015  . 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 Peripheral neuropathy due to chemotherapy ([place]) 03/15/2015  . Rash 08/17/2014  . Fever 08/04/2014  . Breast cancer 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 ([place]) 07/22/2014  . Family history of breast cancer 07/22/2014  . Lung mas bilaterial 07/22/2014    Otelia Limes, PTA 07/11/2015, 9:36 AM  [place], [place], [address] Phone: [phone]   Fax:  [phone]  Name: KESSIE CROSTON MRN: [id number] Date of Birth:  [birth year]

## 2015-07-12 ENCOUNTER — Ambulatory Visit
Admission: RE | Admit: 2015-07-12 | Discharge: 2015-07-12 | Disposition: A | Discharge status: Home / Self Care | Payer: BLUE CROSS/BLUE SHIELD | Source: Ambulatory Visit | Attending: Radiation Oncology | Admitting: Radiation Oncology

## 2015-07-12 ENCOUNTER — Ambulatory Visit: Payer: BLUE CROSS/BLUE SHIELD

## 2015-07-12 ENCOUNTER — Encounter: Payer: Self-pay | Admitting: Radiation Oncology

## 2015-07-12 VITALS — BP 119/74 | HR 83 | Temp 98.6°F | Ht 70.0 in | Wt 213.8 lb

## 2015-07-12 DIAGNOSIS — C50311 Malignant neoplasm of lower-inner quadrant of right female breast: Secondary | ICD-10-CM

## 2015-07-12 DIAGNOSIS — R609 Edema, unspecified: Secondary | ICD-10-CM

## 2015-07-12 DIAGNOSIS — Z9189 Other specified personal risk factors, not elsewhere classified: Secondary | ICD-10-CM

## 2015-07-12 DIAGNOSIS — M25611 Stiffness of right shoulder, not elsewhere classified: Secondary | ICD-10-CM

## 2015-07-12 HISTORY — DX: Personal history of irradiation: Z92.3

## 2015-07-12 NOTE — Progress Notes (Signed)
CC: Dr. Stark Klein  Follow-up note:  Ms. 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  Her fatigue continues to improve.  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  I have her continuing  on disability until 08/15/2015.  Her peripheral neuropathy is also continuing to improve.  She has been seen by the lymphedema therapist and she was receiving a sleeve to prevent right upper extremity lymphedema.  A recent CT scan showed essentially stable disease within her chest although there was a new small pleural-based nodule in the right upper lobe.  She is being followed closely by Dr. FBurr Medico  She continues with Herceptin.  Physical examination: Alert and oriented. Filed Vitals:   07/12/15 1111  BP: 119/74  Pulse: 83  Temp: 98.6 F (37 C)   She wears a wig.  Nodes: There is no palpable cervical, supraclavicular, or axillary lymphadenopathy.  Breasts: There is residual hyperpigmentation the skin with continued dry desquamation along the inframammary region.  She has a 4 cm seroma along the lower inner quadrant of the right breast.  There is moderate thickening of the right breast, particularly along her inferior breast as expected.  Left breast without masses or lesions.  Extremities: There is trace right upper extremity lymphedema.  Impression: Satisfactory progress, recovering from radiation therapy.  She'll maintain close follow-up with Dr. FBurr Medico  She may return to work in early January.  She'll have close follow-up of her right upper lobe pleural-based nodule through Dr. FBurr Medico  No scheduled follow-up visit here but we can see her as needed.  Plan: As above.

## 2015-07-12 NOTE — Progress Notes (Signed)
Ms. Sheaffer reports that when her breast moves she has increased tenderness and she continues to have moisture underneath her breast and is presently using cornstarch.  Hyperpigmentation present.

## 2015-07-12 NOTE — Therapy (Signed)
[place], [place], [address] Phone: [phone]   Fax:  [phone]  Physical Therapy Treatment  Patient Details  Name: [name] [name] MRN: [id number] Date of Birth: 1971-02-07 Referring Provider: Dr. Stark Klein  Encounter Date: 07/12/2015      PT End of Session - 07/12/15 0923    Visit Number 13   Number of Visits 16   Date for PT Re-Evaluation 07/19/15   PT Start Time 0852   PT Stop Time 0933   PT Time Calculation (min) 41 min   Activity Tolerance Patient tolerated treatment well   Behavior During Therapy [facility] for tasks assessed/performed      Past Medical History  Diagnosis Date  . Breast cancer ([place])   . Pneumonia     hx of pneumonia 08/2013   . GERD (gastroesophageal reflux disease)     during pregnancy   . Breast cancer ([place])   . Breast cancer, right breast ([place]) 04/14/2015  . S/P radiation therapy 05/03/2015 through 06/14/2015     Right breast 4680 cGy in 26 sessions, right breast boost 1000 cGy in 5 sessions. Right supraclavicular/axillary region 4680 cGy with a supplemental PA field to bring the axillary dose up to 4500 cGy in 26 sessions    Past Surgical History  Procedure Laterality Date  . Cesarean section    . Essure tubal ligation    . Cholecystectomy    . Wisdom tooth extraction    . Portacath placement Left 07/21/2014    Procedure: INSERTION PORT-A-CATH;  Surgeon: Stark Klein, MD;  [facility];  Service: General;  Laterality: Left;  . Breast lumpectomy with radioactive seed and sentinel lymph node biopsy Right 03/29/2015    Procedure: BREAST LUMPECTOMY WITH RADIOACTIVE SEED AND SENTINEL LYMPH NODE BIOPSY;  Surgeon: Stark Klein, MD;  Location: [facility];  Service: General;  Laterality: Right;    There were no vitals filed for this visit.  Visit Diagnosis:  At risk for lymphedema  Postoperative  edema  Stiffness of joint, shoulder region, right      Subjective Assessment - 07/12/15 0859    Subjective I was a little sore after yesterdays visit when I left, but then it felt better throughout the day and I slept better last night and it feels good today.    Currently in Pain? No/denies            [facility] PT Assessment - 07/12/15 0001    AROM   Right Shoulder Flexion 158 Degrees   Right Shoulder ABduction 165 Degrees                     [facility] Adult PT Treatment/Exercise - 07/12/15 0001    Shoulder Exercises: Pulleys   Flexion 2 minutes   ABduction 1 minute   Shoulder Exercises: Therapy Ball   Flexion 10 reps   Shoulder Exercises: Stretch   Corner Stretch 3 reps;10 seconds  In doorway   Manual Therapy   Passive ROM Right shoulder for ER, abduction, D2, and flexion.to pts tolerance.                PT Education - 07/12/15 [phone]    Education provided Yes   Education Details Lymphedema risk reduction practices and infection prevention   Person(s) Educated Patient   Methods Explanation;Handout   Comprehension Verbalized understanding                Long Term Clinic Goals -  07/12/15 0906    CC Long Term Goal  #1   Title Patient with verbalize an understanding of lymphedema risk reduction precautions   Status Achieved   CC Long Term Goal  #2   Title Patient will be independent in a home exercise program for strength improvement to decrease lymphedema risk with > 5 nodes removed   Status Achieved   CC Long Term Goal  #3   Title Patient will be know how to obtain and use compression garments for prophylaxis  Pt going today to get measured (tried to go yesterday but they are closed on Monday)   Status Achieved   CC Long Term Goal  #4   Title Patient will decrease the DASH score to < 20   to demonstrate increased functional use of upper extremity   Status Achieved            Plan - 07/12/15 0934    Clinical Impression Statement Pt  has met all her goals and her ROM is WNLs. She is ready for discharge.    Pt will benefit from skilled therapeutic intervention in order to improve on the following deficits Impaired UE functional use;Decreased activity tolerance;Decreased knowledge of precautions;Impaired perceived functional ability;Decreased knowledge of use of DME;Decreased strength;Increased edema;Increased fascial restricitons   Rehab Potential Excellent   Clinical Impairments Affecting Rehab Potential months of chemo with some sensory impairment,  radiation therapy    PT Frequency 2x / week   PT Duration 4 weeks   PT Treatment/Interventions Manual techniques;Therapeutic exercise   PT Next Visit Plan Discharge this visit.    Consulted and Agree with Plan of Care Patient        Problem List Patient Active Problem List   Diagnosis Date Noted  . Shingles 05/12/2015  . Diarrhea 04/29/2015  . Cancer of lower-inner quadrant of female breast ([place]) 04/14/2015  . 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 Peripheral neuropathy due to chemotherapy ([place]) 03/15/2015  . Rash 08/17/2014  . Fever 08/04/2014  . Breast cancer 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 ([place]) 07/22/2014  . Family history of breast cancer 07/22/2014  . Lung mas bilaterial 07/22/2014    [name] [name], PTA 07/12/2015, 9:41 AM  [place], [place], [address] Phone: [phone]   Fax:  [phone]  Name: [name] [name] MRN: [id number] Date of Birth: 09-20-1970    PHYSICAL THERAPY DISCHARGE SUMMARY  Visits from Start of Care: 13  Current functional level related to goals / functional outcomes: All goals met.  Please see above.   Remaining deficits: None noted; patient would benefit from continuing HEP.   Education / Equipment: HEP; pt. to get compression sleeve for prophylaxis. Plan: Patient agrees to discharge.  Patient goals were partially met. Patient is being  discharged due to meeting the stated rehab goals.  ?????    Serafina Royals, PT 07/12/2015 11:55 AM

## 2015-07-13 ENCOUNTER — Encounter: Payer: BLUE CROSS/BLUE SHIELD | Admitting: Physical Therapy

## 2015-07-19 ENCOUNTER — Encounter: Payer: Self-pay | Admitting: Radiation Oncology

## 2015-07-19 NOTE — Progress Notes (Signed)
Paperwork received given to RN, 07/19/15 Ardeen Fillers)

## 2015-07-20 ENCOUNTER — Encounter: Payer: Self-pay | Admitting: Radiation Oncology

## 2015-07-20 NOTE — Progress Notes (Signed)
Paperwork Psychologist, counselling) was scanned in on the 2nd of December and faxed 07/20/15, confirmation page received, spoke with patient 07/20/15 Ardeen Fillers)

## 2015-07-26 ENCOUNTER — Encounter: Payer: Self-pay | Admitting: Hematology

## 2015-07-26 ENCOUNTER — Encounter: Payer: Self-pay | Admitting: *Deleted

## 2015-07-26 ENCOUNTER — Other Ambulatory Visit: Payer: BLUE CROSS/BLUE SHIELD

## 2015-07-26 ENCOUNTER — Ambulatory Visit (HOSPITAL_BASED_OUTPATIENT_CLINIC_OR_DEPARTMENT_OTHER): Payer: BLUE CROSS/BLUE SHIELD

## 2015-07-26 ENCOUNTER — Ambulatory Visit (HOSPITAL_BASED_OUTPATIENT_CLINIC_OR_DEPARTMENT_OTHER): Payer: BLUE CROSS/BLUE SHIELD | Admitting: Hematology

## 2015-07-26 ENCOUNTER — Ambulatory Visit: Payer: BLUE CROSS/BLUE SHIELD

## 2015-07-26 ENCOUNTER — Telehealth: Payer: Self-pay | Admitting: *Deleted

## 2015-07-26 ENCOUNTER — Other Ambulatory Visit (HOSPITAL_BASED_OUTPATIENT_CLINIC_OR_DEPARTMENT_OTHER): Payer: BLUE CROSS/BLUE SHIELD

## 2015-07-26 ENCOUNTER — Telehealth: Payer: Self-pay | Admitting: Hematology

## 2015-07-26 VITALS — BP 141/80 | HR 82 | Temp 98.3°F | Resp 18 | Ht 70.0 in | Wt 215.6 lb

## 2015-07-26 DIAGNOSIS — C50911 Malignant neoplasm of unspecified site of right female breast: Secondary | ICD-10-CM

## 2015-07-26 DIAGNOSIS — C7801 Secondary malignant neoplasm of right lung: Secondary | ICD-10-CM

## 2015-07-26 DIAGNOSIS — C7800 Secondary malignant neoplasm of unspecified lung: Principal | ICD-10-CM

## 2015-07-26 DIAGNOSIS — D6481 Anemia due to antineoplastic chemotherapy: Secondary | ICD-10-CM | POA: Diagnosis not present

## 2015-07-26 DIAGNOSIS — Z171 Estrogen receptor negative status [ER-]: Secondary | ICD-10-CM | POA: Diagnosis not present

## 2015-07-26 DIAGNOSIS — G629 Polyneuropathy, unspecified: Secondary | ICD-10-CM

## 2015-07-26 DIAGNOSIS — C7802 Secondary malignant neoplasm of left lung: Secondary | ICD-10-CM | POA: Diagnosis not present

## 2015-07-26 DIAGNOSIS — Z5112 Encounter for antineoplastic immunotherapy: Secondary | ICD-10-CM

## 2015-07-26 DIAGNOSIS — Z95828 Presence of other vascular implants and grafts: Secondary | ICD-10-CM

## 2015-07-26 DIAGNOSIS — D630 Anemia in neoplastic disease: Secondary | ICD-10-CM

## 2015-07-26 LAB — COMPREHENSIVE METABOLIC PANEL
ALT: 19 U/L (ref 0–55)
ANION GAP: 8 meq/L (ref 3–11)
AST: 21 U/L (ref 5–34)
Albumin: 3.4 g/dL — ABNORMAL LOW (ref 3.5–5.0)
Alkaline Phosphatase: 79 U/L (ref 40–150)
BILIRUBIN TOTAL: 0.36 mg/dL (ref 0.20–1.20)
BUN: 12.5 mg/dL (ref 7.0–26.0)
CALCIUM: 9.3 mg/dL (ref 8.4–10.4)
CHLORIDE: 107 meq/L (ref 98–109)
CO2: 25 meq/L (ref 22–29)
CREATININE: 0.8 mg/dL (ref 0.6–1.1)
Glucose: 85 mg/dl (ref 70–140)
Potassium: 3.8 mEq/L (ref 3.5–5.1)
Sodium: 139 mEq/L (ref 136–145)
TOTAL PROTEIN: 7.4 g/dL (ref 6.4–8.3)

## 2015-07-26 LAB — CBC WITH DIFFERENTIAL/PLATELET
BASO%: 0.2 % (ref 0.0–2.0)
BASOS ABS: 0 10*3/uL (ref 0.0–0.1)
EOS ABS: 0.1 10*3/uL (ref 0.0–0.5)
EOS%: 1.7 % (ref 0.0–7.0)
HEMATOCRIT: 35.3 % (ref 34.8–46.6)
HGB: 11.6 g/dL (ref 11.6–15.9)
LYMPH#: 1.4 10*3/uL (ref 0.9–3.3)
LYMPH%: 22.6 % (ref 14.0–49.7)
MCH: 28.4 pg (ref 25.1–34.0)
MCHC: 32.9 g/dL (ref 31.5–36.0)
MCV: 86.5 fL (ref 79.5–101.0)
MONO#: 0.6 10*3/uL (ref 0.1–0.9)
MONO%: 9.4 % (ref 0.0–14.0)
NEUT#: 4 10*3/uL (ref 1.5–6.5)
NEUT%: 66.1 % (ref 38.4–76.8)
PLATELETS: 212 10*3/uL (ref 145–400)
RBC: 4.08 10*6/uL (ref 3.70–5.45)
RDW: 14.1 % (ref 11.2–14.5)
WBC: 6 10*3/uL (ref 3.9–10.3)

## 2015-07-26 MED ORDER — SODIUM CHLORIDE 0.9 % IJ SOLN
10.0000 mL | INTRAMUSCULAR | Status: DC | PRN
  Administered 2015-07-26: 10 mL
  Filled 2015-07-26: qty 10

## 2015-07-26 MED ORDER — SODIUM CHLORIDE 0.9 % IV SOLN
Freq: Once | INTRAVENOUS | Status: AC
  Administered 2015-07-26: 14:00:00 via INTRAVENOUS

## 2015-07-26 MED ORDER — HEPARIN SOD (PORK) LOCK FLUSH 100 UNIT/ML IV SOLN
500.0000 [IU] | Freq: Once | INTRAVENOUS | Status: AC | PRN
  Administered 2015-07-26: 500 [IU]
  Filled 2015-07-26: qty 5

## 2015-07-26 MED ORDER — SODIUM CHLORIDE 0.9 % IJ SOLN
10.0000 mL | INTRAMUSCULAR | Status: DC | PRN
  Administered 2015-07-26: 10 mL via INTRAVENOUS
  Filled 2015-07-26: qty 10

## 2015-07-26 MED ORDER — ACETAMINOPHEN 325 MG PO TABS
650.0000 mg | ORAL_TABLET | Freq: Once | ORAL | Status: AC
  Administered 2015-07-26: 650 mg via ORAL

## 2015-07-26 MED ORDER — TRASTUZUMAB CHEMO INJECTION 440 MG
6.0000 mg/kg | Freq: Once | INTRAVENOUS | Status: AC
  Administered 2015-07-26: 567 mg via INTRAVENOUS
  Filled 2015-07-26: qty 27

## 2015-07-26 MED ORDER — ACETAMINOPHEN 325 MG PO TABS
ORAL_TABLET | ORAL | Status: AC
  Filled 2015-07-26: qty 2

## 2015-07-26 NOTE — Patient Instructions (Signed)

## 2015-07-26 NOTE — Patient Instructions (Signed)
[facility] Discharge Instructions for Patients Receiving Chemotherapy  Today you received the following chemotherapy agents: Herceptin   To help prevent nausea and vomiting after your treatment, we encourage you to take your nausea medication as directed.    If you develop nausea and vomiting that is not controlled by your nausea medication, call the clinic.   BELOW ARE SYMPTOMS THAT SHOULD BE REPORTED IMMEDIATELY:  *FEVER GREATER THAN 100.5 F  *CHILLS WITH OR WITHOUT FEVER  NAUSEA AND VOMITING THAT IS NOT CONTROLLED WITH YOUR NAUSEA MEDICATION  *UNUSUAL SHORTNESS OF BREATH  *UNUSUAL BRUISING OR BLEEDING  TENDERNESS IN MOUTH AND THROAT WITH OR WITHOUT PRESENCE OF ULCERS  *URINARY PROBLEMS  *BOWEL PROBLEMS  UNUSUAL RASH Items with * indicate a potential emergency and should be followed up as soon as possible.  Feel free to call the clinic you have any questions or concerns. The clinic phone number is [phone].  Please show the CHEMO ALERT CARD at check-in to the Emergency Department and triage nurse.   

## 2015-07-26 NOTE — Progress Notes (Signed)
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 MRI breast on 07/16/2014 showed 7cm R breast lesion and 4.4cm r axillary node    07/02/2014 Imaging CT CAP: a 4.7cm mass in LUL lung and a 2.1cm mas in RML, and a small nodule in RUL, suspecious for metastasis     07/09/2014 Initial Diagnosis right IDA with b/l lung lesions, ER-/PR-/HER2+   07/09/2014 Initial Biopsy US guided right breast mass and axillary node biopsy showed IDA, and DCIS, ER-/PR-/HER2+   07/26/2014 Pathologic Stage Left lung mass by IR, path revealed high grade carcinoma, morphology similar to breast tumor biopsy, TTF(-), NapsinA(-), ER(-)   08/04/2014 - 03/22/2015 Chemotherapy weekly Paclitaxel 37m/m2, trastuzumab and pertuzumab every 3 weeks   10/04/2014 Imaging Interval decrease in the right axillary lymphadenopathy. Bilateral pulmonary lesions with left hilar lymphadenopathy also markedly decreased in the interval. The left hilar lymphadenopathy has resolved.   12/20/2014 Imaging restaging CT showed stable disease, no new lesions    03/29/2015 Pathology Results  right breast lumpectomy showed  chemotherapy treatment effect,  a 1 mm residual tumor,   margins were widely negative, 5 sentinel lymph nodes and 2  axillary lymph nodes were negative.    03/29/2015 Surgery  right breast lumpectomy and sentinel lymph node biopsy  by Dr. BBarry Dienes  04/12/2015 -  Chemotherapy  Herceptin maintenance therapy , 6 mg/kg, every 3 weeks   05/03/2015 - 06/14/2015 Radiation Therapy right breast adjuvant irradiation by Dr. MValere Dross    CURRENT THERAPY: Breast irradiation, started on 05/03/2015. Herceptin maintenance therapy every 3 weeks, started on 04/12/2015  INTERVAL HISTROY   [name] [name] for follow-up and Herceptin treatment. She is doing very well, denies any significant pain, cough, dyspnea, or GI symptoms. She had muscle cramps in her right calf and right arm a few days ago, which lasted the few hours. She denies any weakness, numbness or tingling. She has good appetite and eating well. Her energy level is great. She is scheduled to return to work on 08/16/2015.   MEDICAL HISTORY:  Past Medical History  Diagnosis Date  . Breast cancer ([facility]   . Pneumonia     hx of pneumonia 08/2013   . GERD (gastroesophageal reflux disease)     during pregnancy   . [place]   . Breast cancer, right breast ([facility] 04/14/2015  . S/P radiation therapy 05/03/2015 through 06/14/2015     Right breast 4680 cGy in 26 sessions, right breast boost 1000 cGy in 5 sessions. Right supraclavicular/axillary region 4680 cGy with a supplemental PA field to bring the axillary dose up to 4500 cGy in 26 sessions    SURGICAL HISTORY: Past Surgical History  Procedure Laterality Date  . Cesarean section    . Essure tubal ligation    .  Cholecystectomy    . Wisdom tooth extraction    . Portacath placement Left 07/21/2014    Procedure: INSERTION PORT-A-CATH;  Surgeon: Stark Klein, MD;  Location: [facility];  Service: General;  Laterality: Left;  . Breast lumpectomy with radioactive seed and sentinel lymph node biopsy Right 03/29/2015    Procedure: BREAST LUMPECTOMY WITH RADIOACTIVE SEED AND  SENTINEL LYMPH NODE BIOPSY;  Surgeon: Stark Klein, MD;  Location: [facility];  Service: General;  Laterality: Right;    SOCIAL HISTORY: History   Social History  . Marital Status: Married    Spouse Name: N/A    Number of Children:  she has 2 daughters at the age of 76 and 16.   . Years of Education: N/A   Occupational History  .  works as Freight forwarder for a [employer].    Social History Main Topics  . Smoking status: Never Smoker   . Smokeless tobacco: Not on file  . Alcohol Use: Yes  . Drug Use: No  . Sexual Activity: No    FAMILY HISTORY: Family History  Problem Relation Age of Onset  . Breast cancer Mother 42  . Liver cancer Father 36  . Breast cancer Maternal Aunt 36  . Prostate cancer Maternal Grandfather   . Liver cancer Paternal Grandmother   . Prostate cancer Paternal Grandfather      GENETICS: 08/30/2014 BreastNext panel was negative. 17 genes including BRCA1, BRCA2, were negative for mutations.   ALLERGIES:  is allergic to adhesive; aspirin; and codeine.  MEDICATIONS:  Current Outpatient Prescriptions  Medication Sig Dispense Refill  . Camphor-Eucalyptus-Menthol (VICKS VAPORUB EX) Apply 1 application topically at bedtime. Applies under nose, throat and on chest.    . cetirizine (ZYRTEC) 10 MG tablet Take 10 mg by mouth daily as needed for allergies (allergies).     . clindamycin (CLINDAGEL) 1 % gel Apply topically to affected areas BID. 60 g 3  . emollient (BIAFINE) cream Apply 1 application topically 2 (two) times daily.    . hydrocortisone 2.5 % cream APPLY TOPICALLY TO THE AFFECTED AREA TWICE DAILY 454 g 0  . lidocaine-prilocaine (EMLA) cream Apply topically as needed. 30 g 2  . loperamide (IMODIUM A-D) 2 MG tablet Take 1 tablet (2 mg total) by mouth 4 (four) times daily as needed for diarrhea or loose stools. 30 tablet 0  . LORazepam (ATIVAN) 1 MG tablet Take 1 tablet (1 mg total) by mouth once. 2 tablet 0  . ondansetron (ZOFRAN) 8 MG  tablet Take 1 tablet (8 mg total) by mouth every 8 (eight) hours as needed for nausea or vomiting. 30 tablet 3  . pantoprazole (PROTONIX) 20 MG tablet Take 1 tablet (20 mg total) by mouth daily. 30 tablet 3  . Pseudoeph-Doxylamine-DM-APAP (NYQUIL PO) Take 30 mLs by mouth 2 (two) times daily as needed (cold/allergies).     . non-metallic deodorant Jethro Poling) MISC Apply 1 application topically daily as needed.    Marland Kitchen oxyCODONE-acetaminophen (ROXICET) 5-325 MG per tablet Take 1-2 tablets by mouth every 4 (four) hours as needed for severe pain. (Patient not taking: Reported on 07/26/2015) 30 tablet 0   No current facility-administered medications for this visit.   Facility-Administered Medications Ordered in Other Visits  Medication Dose Route Frequency Provider Last Rate Last Dose  . sodium chloride 0.9 % injection 10 mL  10 mL Intracatheter PRN [name], MD   10 mL at 07/26/15 1520    REVIEW OF SYSTEMS:   Constitutional: Denies  fevers, chills or abnormal night sweats, (+) malaise  Eyes: Denies blurriness of vision, double vision or watery eyes Ears, nose, mouth, throat, and face: Denies mucositis or sore throat Respiratory: (+) productive cough, no dyspnea or wheezes Cardiovascular: Denies palpitation, chest discomfort or lower extremity swelling Gastrointestinal:  Denies nausea, heartburn or change in bowel habits Skin:(+)  skin pigmentation on bilateral forearms and left elbow Lymphatics: Denies new lymphadenopathy or easy bruising Neurological:Denies numbness, tingling or new weaknesses Behavioral/Psych: Mood is stable, no new changes  All other systems were reviewed with the patient and are negative.  PHYSICAL EXAMINATION: ECOG PERFORMANCE STATUS: 1 BP 141/80 mmHg  Pulse 82  Temp(Src) 98.3 F (36.8 C) (Oral)  Resp 18  Ht 5' 10"  (1.778 m)  Wt 215 lb 9.6 oz (97.796 kg)  BMI 30.94 kg/m2  SpO2 100% GENERAL:alert, no distress and comfortable SKIN: skin color, texture, turgor are normal,  no rashes or significant lesions EYES: normal, conjunctiva are pink and non-injected, sclera clear OROPHARYNX:no exudate, no erythema and lips, buccal mucosa, and tongue normal  NECK: supple, thyroid normal size, non-tender, without nodularity LYMPH:  no palpable lymphadenopathy in the cervical, axillary or inguinal LUNGS: clear to auscultation and percussion with normal breathing effort HEART: regular rate & rhythm and no murmurs and no lower extremity edema ABDOMEN:abdomen soft, non-tender and normal bowel sounds Musculoskeletal:no cyanosis of digits and no clubbing  PSYCH: alert & oriented x 3 with fluent speech NEURO: no focal motor/sensory deficits Breasts:  Status post right breast lumpectomy and sentinel lymph node biopsy, incisions have healed well. There is a 5cm lump above her breast incision site, non-tender. (+) Moderate skin pigmentation of right breast secondary to radiation, previous skin ulcers have healed. lcers under her right breast. Palpation of left breast  and axilla revealed no other obvious mass that I could appreciate. Skin: (+) Skin pigmentation on hands and feet, lighter than before. No skin rashes. (+)  pigmentation on fingernails, and bulging of fingernails and thickening of soft tissue under nailbed.  LABORATORY DATA:  I have reviewed the data as listed CBC Latest Ref Rng 07/26/2015 07/05/2015 06/14/2015  WBC 3.9 - 10.3 10e3/uL 6.0 4.9 4.6  Hemoglobin 11.6 - 15.9 g/dL 11.6 13.2 11.7  Hematocrit 34.8 - 46.6 % 35.3 40.9 35.6  Platelets 145 - 400 10e3/uL 212 212 206    CMP Latest Ref Rng 07/26/2015 07/05/2015 06/14/2015  Glucose 70 - 140 mg/dl 85 114 100  BUN 7.0 - 26.0 mg/dL 12.5 14.7 8.7  Creatinine 0.6 - 1.1 mg/dL 0.8 0.9 0.7  Sodium 136 - 145 mEq/L 139 141 141  Potassium 3.5 - 5.1 mEq/L 3.8 4.1 3.8  Chloride 96 - 112 mmol/L - - -  CO2 22 - 29 mEq/L 25 27 28   Calcium 8.4 - 10.4 mg/dL 9.3 9.9 9.1  Total Protein 6.4 - 8.3 g/dL 7.4 8.1 7.1  Total Bilirubin  0.20 - 1.20 mg/dL 0.36 0.48 <0.30  Alkaline Phos 40 - 150 U/L 79 86 66  AST 5 - 34 U/L 21 25 17   ALT 0 - 55 U/L 19 27 15      PATHOLOGY REPORT 07/09/2014 #1 breast, right needle core biopsy more anterior -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 45)  Lung, biopsy, Left 07/26/2014 - HIGH GRADE CARCINOMA, SEE COMMENT. Microscopic Comment The carcinoma  demonstrates the following immunophenotype: TTF-1 - negative expression. Napsin A- negative expression. CK5/6 - focal, moderate expression. estrogen receptor - negative expression. GCDFP- negative expression. The recent breast biopsy demonstrating Her2 amplified high grade invasive mammary carcinoma is noted ([id number]). Although the immunophenotype of the current case is non-sepcific, it strongly argues against primary lung adenocarcinoma. However, on re-review, the morphology of the current case is essentially identical to the primary mammary carcinoma. In lieu of further immunophenotyping, the tumor will be submitted for Her2 testing and the remaining tissue will be reserved for additional ancillary tumor testing. The results of the Her2 testing will be reported in an addendum. The case was discussed with Dr Burr Medico on 07/28/2015 and 07/29/2015 HER2 POSITIVE   Diagnosis 03/29/2015 1. Breast, lumpectomy, Right - INVASIVE DUCTAL CARCINOMA, SEE COMMENT. - SEE TUMOR SYNOPTIC TEMPLATE BELOW. 2. Lymph node, sentinel, biopsy, Right axillary #1 - ONE LYMPH NODE, NEGATIVE FOR TUMOR (0/1). - SEE COMMENT. 3. Lymph node, sentinel, biopsy, Right axillary #2 - BENIGN FIBROFATTY SOFT TISSUE. - NEGATIVE FOR ATYPIA OR MALIGNANCY. - NEGATIVE FOR LYMPH NODE. 4. Lymph node, sentinel, biopsy, Right axillary #3 - ONE LYMPH NODE,  NEGATIVE FOR TUMOR (0/1). - SEE COMMENT. 5. Lymph node, sentinel, biopsy, Right axillary #4 - ONE LYMPH NODE, NEGATIVE FOR TUMOR (0/1). - SEE COMMENT. 6. Lymph node, sentinel, biopsy, Right axillary #5 - ONE LYMPH NODE, NEGATIVE FOR TUMOR (0/1). - SEE COMMENT. 7. Lymph node, sentinel, biopsy, right axillary - ONE LYMPH NODE, NEGATIVE FOR TUMOR (0/1). - SEE COMMENT. 8. Lymph node, sentinel, biopsy, right axillary - ONE LYMPH NODE, NEGATIVE FOR TUMOR (0/1). - SEE COMMENT. 1 of 4 Amended copy Amended FINAL for [name] [name], [name] [name] ([id number]) Microscopic Comment 1. BREAST, INVASIVE TUMOR, WITH LYMPH NODES PRESENT Specimen, including laterality and lymph node sampling (sentinel, non-sentinel): Right breast with sentinel and non-sentinel lymph node sampling Procedure: Lumpectomy Histologic type: Ductal, see comment Grade: 2 of 3, see comment. Tubule formation: 3 Nuclear pleomorphism: 3 Mitotic: 1 Tumor size (glass slide measurement): 1 mm, see comment Margins: Invasive, distance to closest margin: See comment In-situ, distance to closest margin: N/A If margin positive, focally or broadly: N/A Lymphovascular invasion: Absent Ductal carcinoma in situ: Absent Grade: N/A Extensive intraductal component: N/A Lobular neoplasia: Absent Tumor focality: Unifocal, see comment Treatment effect: Present If present, treatment effect in breast tissue, lymph nodes or both: Both breast tissue and lymph nodes Extent of tumor: Skin: N/A Nipple: N/A Skeletal muscle: N/A Lymph nodes: Examined: 5 Sentinel 2 Non-sentinel 7 Total Lymph nodes with metastasis: 0 Isolated tumor cells (< 0.2 mm): N/A Micrometastasis: (> 0.2 mm and < 2.0 mm): N/A Macrometastasis: (> 2.0 mm): N/A Extracapsular extension: N/A Breast prognostic profile: Estrogen receptor: Not repeated, previous study demonstrated 0% positivity ([id number]). Progesterone receptor: Not repeated, previous study demonstrated 0%  positivity ([id number]) Her 2 neu: Demonstrates amplificaition ([id number]) Ki-67: Not repeated, previous study demonstrate 79% proliferation rate ([id number]). Non-neoplastic breast: Neoadjuvant related tissue changes and adenosis. TNM: ypT10m, ypN0, pMX Comments: Numerous representative sections including the 2.0 cm hemorrhagic tissue associated with X-shaped biopsy clip, the 2.0 cm ill defined lesion associated with M-shaped clip, and intervening tissue were submitted for review. Slide sections from the tissue surrounding the X-shaped and M-shaped clips demonstrate tissue changes consistent with neoadjuvant related change. There is no invasive or in situ carcinom present at either site. However, in a representative section from the intervening tissue (slide G), there is a 1 mm focus of high grade invasive ductal carcinoma present.  Given the minute size of the tumor, tumor grading is limited. The tumor is present within non-marginal tissue sections and is considered to be at least 0.5 cm from the nearest margin (medial).   Note: Amendment issued for modification of synoptic table, inadvertent typographical error. The change in the synoptic table was discussed with Dr Burr Medico on 04/07/2015. (CRR:ecj 04/07/2015) 2. , 4-8. In parts 2 and 4, there is extensive neoadjuvant related tissue changes including abundant foamy macrophages, fibroinflammatory reaction and dystrophic calcifications. The neoadjuvant tissue changes extend into perinodal soft tissue and are either focally or broadly present at the cauterized edge of the tissue submitted. There are no definitive features of malignancy present.  RADIOGRAPHIC STUDIES:  PET scan 02/21/2015  IMPRESSION: 1. No evidence of metabolically active breast cancer metastasis. 2. Stable parenchymal thickening in the upper lobes at site of prior pulmonary masses. These stable pulmonary parenchymal thicken does not have associated metabolic activity.  CT  chest, abdomen and pelvis with contrast 07/04/2015 IMPRESSION: Status post right breast lumpectomy with right axillary lymph node dissection. Postoperative seroma in the lower/inner right breast.  Suspected treated metastases in the left upper lobe and posterior right middle lobe, non FDG avid on prior PET, stable versus mildly improved.  8 x 14 mm subpleural nodule in the posterior right upper lobe, new from prior studies, indeterminate. Attention on follow-up is suggested.  No evidence of metastatic disease in the abdomen/pelvis.  ECHO 06/09/2015 Study Conclusion - Left ventricle: The cavity size was normal. Global longitudinal strain -21.8%. Systolic function was vigorous. The estimated ejection fraction was in the range of 65% to 70%. Wall motion was normal; there were no regional wall motion abnormalities. Left ventricular diastolic function parameters were normal. - Aortic valve: Transvalvular velocity was within the normal range. There was no stenosis. There was no regurgitation. - Mitral valve: There was mild regurgitation. - Right ventricle: The cavity size was normal. Wall thickness was normal. Systolic function was normal. - Tricuspid valve: There was no regurgitation. - Pulmonic valve: There was trivial regurgitation.   ASSESSMENT & PLAN:  44 year old African-American female, premenopausal, without significant past medical history, presented with palpable right breast mass and right axillary mass.   1. 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 - I reviewed her breasts surgical pathology results with her in great detail ,  She has had fantastic partial response (near complete response) form 8 months of chemotherapy and antibody therapy.   The initial 6.3  cm breast mass has only 1 mm residual tumor,  And the previously 4.4 cm right axillary lymph node has had complete  Pathological response, all 7 nodes were negative for cancer. -I reviewed her restaging CT scans, no evidence of new lesion except an indeterminate RUL subpleural nodule which is new. The right breast lump is a seroma. -will repeat CT CAP in 5-6 weeks  -continue herceptin for now, lab reviewed, CBC, CMP normal , she is clinically doing very well   2. Right breast mass at incision site -This is a seroma from her breast surgery, confirmed by CT   3. G1 peripheral neuropathy, skin pigmentation, hands and feet -improving. We'll continue to monitor closely.  4. Mild anemia -secondary to chemo -stable Hb 11.6 today   Plan -Continue Herceptin every 3 weeks, treatment today. -I'll see her  back in 6 weeks with CT CAP one day before       I spent about 15 minutes counseling the patient and her family members, total care was about 20 minutes.  [name]  07/26/2015

## 2015-07-26 NOTE — Telephone Encounter (Signed)
Per staff message and POF I have scheduled appts. Advised scheduler of appts. JMW  

## 2015-07-26 NOTE — Telephone Encounter (Signed)
per pof to sch pt appt--sent MW email to sch trmt-pt to get updated copyb4 leaving

## 2015-08-13 ENCOUNTER — Encounter (HOSPITAL_BASED_OUTPATIENT_CLINIC_OR_DEPARTMENT_OTHER): Payer: Self-pay | Admitting: *Deleted

## 2015-08-13 ENCOUNTER — Emergency Department (HOSPITAL_BASED_OUTPATIENT_CLINIC_OR_DEPARTMENT_OTHER)
Admission: EM | Admit: 2015-08-13 | Discharge: 2015-08-13 | Disposition: A | Discharge status: Home / Self Care | Payer: BLUE CROSS/BLUE SHIELD | Attending: Emergency Medicine | Admitting: Emergency Medicine

## 2015-08-13 ENCOUNTER — Emergency Department (HOSPITAL_BASED_OUTPATIENT_CLINIC_OR_DEPARTMENT_OTHER): Payer: BLUE CROSS/BLUE SHIELD

## 2015-08-13 DIAGNOSIS — Z853 Personal history of malignant neoplasm of breast: Secondary | ICD-10-CM | POA: Diagnosis not present

## 2015-08-13 DIAGNOSIS — R197 Diarrhea, unspecified: Secondary | ICD-10-CM | POA: Diagnosis not present

## 2015-08-13 DIAGNOSIS — J159 Unspecified bacterial pneumonia: Secondary | ICD-10-CM | POA: Diagnosis not present

## 2015-08-13 DIAGNOSIS — K219 Gastro-esophageal reflux disease without esophagitis: Secondary | ICD-10-CM | POA: Diagnosis not present

## 2015-08-13 DIAGNOSIS — Z7952 Long term (current) use of systemic steroids: Secondary | ICD-10-CM | POA: Insufficient documentation

## 2015-08-13 DIAGNOSIS — Z79899 Other long term (current) drug therapy: Secondary | ICD-10-CM | POA: Diagnosis not present

## 2015-08-13 DIAGNOSIS — J189 Pneumonia, unspecified organism: Secondary | ICD-10-CM

## 2015-08-13 DIAGNOSIS — R05 Cough: Secondary | ICD-10-CM | POA: Diagnosis present

## 2015-08-13 LAB — COMPREHENSIVE METABOLIC PANEL
ALK PHOS: 77 U/L (ref 38–126)
ALT: 14 U/L (ref 14–54)
ANION GAP: 8 (ref 5–15)
AST: 20 U/L (ref 15–41)
Albumin: 3.5 g/dL (ref 3.5–5.0)
BUN: 7 mg/dL (ref 6–20)
CALCIUM: 8.7 mg/dL — AB (ref 8.9–10.3)
CO2: 26 mmol/L (ref 22–32)
Chloride: 105 mmol/L (ref 101–111)
Creatinine, Ser: 0.76 mg/dL (ref 0.44–1.00)
Glucose, Bld: 96 mg/dL (ref 65–99)
Potassium: 3.4 mmol/L — ABNORMAL LOW (ref 3.5–5.1)
SODIUM: 139 mmol/L (ref 135–145)
TOTAL PROTEIN: 7.6 g/dL (ref 6.5–8.1)
Total Bilirubin: 0.6 mg/dL (ref 0.3–1.2)

## 2015-08-13 LAB — CBC WITH DIFFERENTIAL/PLATELET
BASOS ABS: 0 10*3/uL (ref 0.0–0.1)
BASOS PCT: 0 %
Eosinophils Absolute: 0.2 10*3/uL (ref 0.0–0.7)
Eosinophils Relative: 4 %
HEMATOCRIT: 35.8 % — AB (ref 36.0–46.0)
HEMOGLOBIN: 11.4 g/dL — AB (ref 12.0–15.0)
Lymphocytes Relative: 21 %
Lymphs Abs: 1.3 10*3/uL (ref 0.7–4.0)
MCH: 27.9 pg (ref 26.0–34.0)
MCHC: 31.8 g/dL (ref 30.0–36.0)
MCV: 87.5 fL (ref 78.0–100.0)
MONOS PCT: 10 %
Monocytes Absolute: 0.6 10*3/uL (ref 0.1–1.0)
NEUTROS ABS: 3.8 10*3/uL (ref 1.7–7.7)
NEUTROS PCT: 65 %
Platelets: 285 10*3/uL (ref 150–400)
RBC: 4.09 MIL/uL (ref 3.87–5.11)
RDW: 13.9 % (ref 11.5–15.5)
WBC: 6 10*3/uL (ref 4.0–10.5)

## 2015-08-13 MED ORDER — HYDROCOD POLST-CPM POLST ER 10-8 MG/5ML PO SUER: 5.0000 mL | Freq: Two times a day (BID) | ORAL | Status: DC | PRN

## 2015-08-13 MED ORDER — LEVOFLOXACIN IN D5W 750 MG/150ML IV SOLN
750.0000 mg | Freq: Once | INTRAVENOUS | Status: AC
  Administered 2015-08-13: 750 mg via INTRAVENOUS
  Filled 2015-08-13: qty 150

## 2015-08-13 MED ORDER — LEVOFLOXACIN 750 MG PO TABS: 750.0000 mg | ORAL_TABLET | Freq: Every day | ORAL | Status: AC

## 2015-08-13 MED ORDER — LEVOFLOXACIN 750 MG PO TABS: 750.0000 mg | ORAL_TABLET | Freq: Once | ORAL | Status: DC

## 2015-08-13 NOTE — ED Provider Notes (Signed)
CSN: [id number]     Arrival date & time 08/13/15  4098 History   First MD Initiated Contact with Patient 08/13/15 [phone]     Chief Complaint  Patient presents with  . URI     (Consider location/radiation/quality/duration/timing/severity/associated sxs/prior Treatment) HPI  44 year old female presents with a cough for the past 3 weeks. Intermittently has white or yellow sputum, the other half is just a dry cough. Has never had fevers. Went to her PCP 10 days ago and has been on Ceftin ear and Phenergan cough syrup with no relief. Tried Mucinex a couple nights ago but this only caused diarrhea so she stopped it. No shortness of breath, only feels chest congestion. Today she woke up with a headache. Headache comes and goes, anytime she coughs she has a brief headache. After coughing her headache seems to go away after a few seconds. Most recently received chemotherapy 2 weeks ago.  Past Medical History  Diagnosis Date  . Breast cancer ([place])   . Pneumonia     hx of pneumonia 08/2013   . GERD (gastroesophageal reflux disease)     during pregnancy   . Breast cancer ([place])   . S/P radiation therapy 05/03/2015 through 06/14/2015     Right breast 4680 cGy in 26 sessions, right breast boost 1000 cGy in 5 sessions. Right supraclavicular/axillary region 4680 cGy with a supplemental PA field to bring the axillary dose up to 4500 cGy in 26 sessions   Past Surgical History  Procedure Laterality Date  . Cesarean section    . Essure tubal ligation    . Cholecystectomy    . Wisdom tooth extraction    . Portacath placement Left 07/21/2014    Procedure: INSERTION PORT-A-CATH;  Surgeon: Stark Klein, MD;  Location: [facility];  Service: General;  Laterality: Left;  . Breast lumpectomy with radioactive seed and sentinel lymph node biopsy Right 03/29/2015    Procedure: BREAST LUMPECTOMY WITH RADIOACTIVE SEED AND SENTINEL LYMPH NODE BIOPSY;  Surgeon: Stark Klein,  MD;  Location: [facility];  Service: General;  Laterality: Right;   Family History  Problem Relation Age of Onset  . Breast cancer Mother 28  . Liver cancer Father   . Breast cancer Maternal Aunt 51  . Prostate cancer Maternal Grandfather 34  . Liver cancer Paternal Grandmother   . Prostate cancer Paternal Grandfather   . Prostate cancer Maternal Uncle 73  . Breast cancer Maternal Aunt 20  . Prostate cancer Maternal Uncle 68  . Breast cancer Cousin 55    mat first cousin   Social History  Substance Use Topics  . Smoking status: Never Smoker   . Smokeless tobacco: Never Used  . Alcohol Use: Yes     Comment: 2-3 drinks per week    OB History    Gravida Para Term Preterm AB TAB SAB Ectopic Multiple Living   2 2              Obstetric Comments   Menarche age 27, BC x 28, G2, P2, last menstrual cycle 08/15/14     Review of Systems  Constitutional: Negative for fever.  HENT: Positive for congestion.   Respiratory: Positive for cough. Negative for shortness of breath.   Gastrointestinal: Positive for diarrhea. Negative for vomiting and abdominal pain.  All other systems reviewed and are negative.     Allergies  Adhesive; Aspirin; and Codeine  Home Medications   Prior to Admission medications   Medication Sig Start Date End  Date Taking? Authorizing Provider  Camphor-Eucalyptus-Menthol (VICKS VAPORUB EX) Apply 1 application topically at bedtime. Applies under nose, throat and on chest.    Historical Provider, MD  cetirizine (ZYRTEC) 10 MG tablet Take 10 mg by mouth daily as needed for allergies (allergies).     Historical Provider, MD  clindamycin (CLINDAGEL) 1 % gel Apply topically to affected areas BID. 11/16/14   [name], MD  emollient (BIAFINE) cream Apply 1 application topically 2 (two) times daily.    Historical Provider, MD  hydrocortisone 2.5 % cream APPLY TOPICALLY TO THE AFFECTED AREA TWICE DAILY 12/07/14   [name], MD  lidocaine-prilocaine (EMLA)  cream Apply topically as needed. 04/12/15   [name], MD  loperamide (IMODIUM A-D) 2 MG tablet Take 1 tablet (2 mg total) by mouth 4 (four) times daily as needed for diarrhea or loose stools. 09/21/14   [name], MD  LORazepam (ATIVAN) 1 MG tablet Take 1 tablet (1 mg total) by mouth once. 03/21/15   [name], MD  non-metallic deodorant Jethro Poling) MISC Apply 1 application topically daily as needed.    Arloa Koh, MD  ondansetron (ZOFRAN) 8 MG tablet Take 1 tablet (8 mg total) by mouth every 8 (eight) hours as needed for nausea or vomiting. 07/28/14   [name], MD  oxyCODONE-acetaminophen (ROXICET) 5-325 MG per tablet Take 1-2 tablets by mouth every 4 (four) hours as needed for severe pain. Patient not taking: Reported on 07/26/2015 03/29/15   Stark Klein, MD  pantoprazole (PROTONIX) 20 MG tablet Take 1 tablet (20 mg total) by mouth daily. 11/16/14   [name], MD  Pseudoeph-Doxylamine-DM-APAP (NYQUIL PO) Take 30 mLs by mouth 2 (two) times daily as needed (cold/allergies).     Historical Provider, MD   BP 153/89 mmHg  Pulse 101  Temp(Src) 98.9 F (37.2 C) (Oral)  Resp 20  Ht '5\' 10"'$  (1.778 m)  Wt 213 lb (96.616 kg)  BMI 30.56 kg/m2  SpO2 96% Physical Exam  Constitutional: She is oriented to person, place, and time. She appears well-developed and well-nourished. No distress.  Intermittent dry cough  HENT:  Head: Normocephalic and atraumatic.  Right Ear: External ear normal.  Left Ear: External ear normal.  Nose: Nose normal.  Eyes: Right eye exhibits no discharge. Left eye exhibits no discharge.  Neck: Neck supple.  Cardiovascular: Normal rate, regular rhythm and normal heart sounds.   Pulmonary/Chest: Effort normal and breath sounds normal. She has no wheezes.  Abdominal: Soft. There is no tenderness.  Neurological: She is alert and oriented to person, place, and time.  Skin: Skin is warm and dry. She is not diaphoretic.  Nursing note and vitals reviewed.   ED Course  Procedures (including  critical care time) Labs Review Labs Reviewed  COMPREHENSIVE METABOLIC PANEL - Abnormal; Notable for the following:    Potassium 3.4 (*)    Calcium 8.7 (*)    All other components within normal limits  CBC WITH DIFFERENTIAL/PLATELET - Abnormal; Notable for the following:    Hemoglobin 11.4 (*)    HCT 35.8 (*)    All other components within normal limits    Imaging Review Dg Chest 2 View  08/13/2015  CLINICAL DATA:  Pt with cough x 3 weeks, on antibiotics and cough suppressant without relief, hx breast ca with radiation and chemo EXAM: CHEST  2 VIEW COMPARISON:  11/28/2014, 07/04/2015 FINDINGS: Left-sided power port tip to the superior vena cava. Heart size is normal. Patchy density is identified within the right  middle lobe consistent with infectious infiltrate. At the right lung apex, there is increased opacity over prior studies, raising question of increased size of apical nodule is identified priors CT chest. IMPRESSION: 1. Suspect right middle lobe infiltrate, likely infectious. Followup PA and lateral chest X-ray is recommended in 3-4 weeks following trial of antibiotic therapy to ensure resolution and exclude underlying malignancy. 2. Right apical density has increased over prior plain films, raising question of increased size of right apical nodule. If there is persistence of this opacity on follow-up exams, follow-up CT chest is recommended. Electronically Signed   By: Nolon Nations M.D.   On: 08/13/2015 10:19   I have personally reviewed and evaluated these images and lab results as part of my medical decision-making.   EKG Interpretation None      MDM   Final diagnoses:  Community acquired pneumonia    Patient's x-rays consistent with pneumonia. She appears quite well, her only complaint is frequent coughing. No hypoxia, increased work of breathing, or symptom at a shortness of breath. Given her history of cancer with recent chemotherapy and did discuss her case with  oncology on call, Dr. Marin Olp, who agrees with outpatient treatment given her well appearance, normal white blood cell count/no neutropenia, and desired to go home. Will treat with Levaquin, he requests IV dose first here and will treat with Tussionex. She states she has had problems with codeine in the past but has had Tussionex before without issue. Discussed strict return precautions and need for close follow-up with her oncologist as in outpatient.    Sherwood Gambler, MD 08/13/15 [phone]

## 2015-08-13 NOTE — ED Notes (Signed)
URI with productive cough with white sputum, HA x 3 weeks.  Pt saw PCP with treatment with antibiotics, cough suppressant.  Pt a CA patient, denies neutropenia.

## 2015-08-13 NOTE — ED Notes (Signed)
MD at bedside. 

## 2015-08-16 ENCOUNTER — Telehealth: Payer: Self-pay | Admitting: *Deleted

## 2015-08-16 ENCOUNTER — Other Ambulatory Visit: Payer: Self-pay | Admitting: Hematology

## 2015-08-16 NOTE — Telephone Encounter (Signed)
I called her back, will cancel her treatment tomorrow, and schedule her to see me with lab next Monday. She knows to call us if her symptoms are not getting better in 2-3 days, and we will schedule her to see symptoms management clinic.  [name]  08/16/2015

## 2015-08-16 NOTE — Telephone Encounter (Signed)
VM message received from pt. Stating that was was seen in ED on 12/231/16 and was told she had pneumonia. Discharged on abx. Pt wants to know if she should keep scheduled appt for Herceptin on 08/17/15  She has appt for labs, flush and then herceptin

## 2015-08-17 ENCOUNTER — Telehealth: Payer: Self-pay | Admitting: Internal Medicine

## 2015-08-17 ENCOUNTER — Ambulatory Visit: Payer: BLUE CROSS/BLUE SHIELD

## 2015-08-17 ENCOUNTER — Other Ambulatory Visit: Payer: BLUE CROSS/BLUE SHIELD

## 2015-08-17 ENCOUNTER — Telehealth: Payer: Self-pay | Admitting: *Deleted

## 2015-08-17 NOTE — Telephone Encounter (Signed)
Per staff message and POF I have scheduled appts. Advised scheduler of appts. JMW  

## 2015-08-17 NOTE — Telephone Encounter (Signed)
per pof to sch pt appt-sent MW email to move trmt-will cll pt after reply

## 2015-08-22 ENCOUNTER — Other Ambulatory Visit: Payer: Self-pay | Admitting: *Deleted

## 2015-08-22 ENCOUNTER — Ambulatory Visit: Payer: 59

## 2015-08-22 ENCOUNTER — Telehealth: Payer: Self-pay | Admitting: Hematology

## 2015-08-22 ENCOUNTER — Telehealth: Payer: Self-pay | Admitting: *Deleted

## 2015-08-22 ENCOUNTER — Ambulatory Visit (HOSPITAL_BASED_OUTPATIENT_CLINIC_OR_DEPARTMENT_OTHER): Payer: 59 | Admitting: Hematology

## 2015-08-22 ENCOUNTER — Ambulatory Visit (HOSPITAL_BASED_OUTPATIENT_CLINIC_OR_DEPARTMENT_OTHER): Payer: 59

## 2015-08-22 ENCOUNTER — Other Ambulatory Visit (HOSPITAL_BASED_OUTPATIENT_CLINIC_OR_DEPARTMENT_OTHER): Payer: 59

## 2015-08-22 VITALS — BP 116/88 | HR 85 | Temp 98.9°F | Resp 18 | Ht 70.0 in | Wt 207.2 lb

## 2015-08-22 DIAGNOSIS — C50911 Malignant neoplasm of unspecified site of right female breast: Secondary | ICD-10-CM | POA: Diagnosis not present

## 2015-08-22 DIAGNOSIS — Z5112 Encounter for antineoplastic immunotherapy: Secondary | ICD-10-CM

## 2015-08-22 DIAGNOSIS — R05 Cough: Secondary | ICD-10-CM

## 2015-08-22 DIAGNOSIS — Z95828 Presence of other vascular implants and grafts: Secondary | ICD-10-CM

## 2015-08-22 DIAGNOSIS — C7800 Secondary malignant neoplasm of unspecified lung: Principal | ICD-10-CM

## 2015-08-22 DIAGNOSIS — C7802 Secondary malignant neoplasm of left lung: Secondary | ICD-10-CM

## 2015-08-22 DIAGNOSIS — C7801 Secondary malignant neoplasm of right lung: Secondary | ICD-10-CM | POA: Diagnosis not present

## 2015-08-22 DIAGNOSIS — Z171 Estrogen receptor negative status [ER-]: Secondary | ICD-10-CM

## 2015-08-22 LAB — COMPREHENSIVE METABOLIC PANEL
ALBUMIN: 3.7 g/dL (ref 3.5–5.0)
ALK PHOS: 89 U/L (ref 40–150)
ALT: 17 U/L (ref 0–55)
AST: 27 U/L (ref 5–34)
Anion Gap: 9 mEq/L (ref 3–11)
BUN: 15.1 mg/dL (ref 7.0–26.0)
CALCIUM: 9.5 mg/dL (ref 8.4–10.4)
CO2: 28 mEq/L (ref 22–29)
Chloride: 101 mEq/L (ref 98–109)
Creatinine: 0.9 mg/dL (ref 0.6–1.1)
EGFR: 84 mL/min/{1.73_m2} — ABNORMAL LOW (ref >90)
GLUCOSE: 95 mg/dL (ref 70–140)
Potassium: 3.9 mEq/L (ref 3.5–5.1)
SODIUM: 138 meq/L (ref 136–145)
TOTAL PROTEIN: 8.5 g/dL — AB (ref 6.4–8.3)
Total Bilirubin: 0.61 mg/dL (ref 0.20–1.20)

## 2015-08-22 LAB — CBC WITH DIFFERENTIAL/PLATELET
BASO%: 0.7 % (ref 0.0–2.0)
BASOS ABS: 0 10*3/uL (ref 0.0–0.1)
EOS%: 1.9 % (ref 0.0–7.0)
Eosinophils Absolute: 0.1 10*3/uL (ref 0.0–0.5)
HEMATOCRIT: 39.4 % (ref 34.8–46.6)
HEMOGLOBIN: 12.9 g/dL (ref 11.6–15.9)
LYMPH#: 1.5 10*3/uL (ref 0.9–3.3)
LYMPH%: 27.1 % (ref 14.0–49.7)
MCH: 28.1 pg (ref 25.1–34.0)
MCHC: 32.8 g/dL (ref 31.5–36.0)
MCV: 85.7 fL (ref 79.5–101.0)
MONO#: 0.5 10*3/uL (ref 0.1–0.9)
MONO%: 9 % (ref 0.0–14.0)
NEUT%: 61.3 % (ref 38.4–76.8)
NEUTROS ABS: 3.5 10*3/uL (ref 1.5–6.5)
Platelets: 308 10*3/uL (ref 145–400)
RBC: 4.6 10*6/uL (ref 3.70–5.45)
RDW: 14.3 % (ref 11.2–14.5)
WBC: 5.7 10*3/uL (ref 3.9–10.3)

## 2015-08-22 MED ORDER — TRASTUZUMAB CHEMO INJECTION 440 MG
6.0000 mg/kg | Freq: Once | INTRAVENOUS | Status: AC
  Administered 2015-08-22: 567 mg via INTRAVENOUS
  Filled 2015-08-22: qty 27

## 2015-08-22 MED ORDER — HYDROCOD POLST-CPM POLST ER 10-8 MG/5ML PO SUER: 5.0000 mL | Freq: Two times a day (BID) | ORAL | Status: DC | PRN

## 2015-08-22 MED ORDER — HEPARIN SOD (PORK) LOCK FLUSH 100 UNIT/ML IV SOLN
500.0000 [IU] | Freq: Once | INTRAVENOUS | Status: AC | PRN
  Administered 2015-08-22: 500 [IU]
  Filled 2015-08-22: qty 5

## 2015-08-22 MED ORDER — SODIUM CHLORIDE 0.9 % IJ SOLN
10.0000 mL | INTRAMUSCULAR | Status: DC | PRN
  Administered 2015-08-22: 10 mL
  Filled 2015-08-22: qty 10

## 2015-08-22 MED ORDER — SODIUM CHLORIDE 0.9 % IV SOLN
Freq: Once | INTRAVENOUS | Status: AC
  Administered 2015-08-22: 13:00:00 via INTRAVENOUS

## 2015-08-22 MED ORDER — ACETAMINOPHEN 325 MG PO TABS
ORAL_TABLET | ORAL | Status: AC
Start: 2015-08-22 — End: 2015-08-22
  Filled 2015-08-22: qty 2

## 2015-08-22 MED ORDER — SODIUM CHLORIDE 0.9 % IJ SOLN
10.0000 mL | INTRAMUSCULAR | Status: DC | PRN
  Administered 2015-08-22: 10 mL via INTRAVENOUS
  Filled 2015-08-22: qty 10

## 2015-08-22 MED ORDER — ACETAMINOPHEN 325 MG PO TABS
650.0000 mg | ORAL_TABLET | Freq: Once | ORAL | Status: AC
  Administered 2015-08-22: 650 mg via ORAL

## 2015-08-22 MED ORDER — HEPARIN SOD (PORK) LOCK FLUSH 100 UNIT/ML IV SOLN
500.0000 [IU] | Freq: Once | INTRAVENOUS | Status: AC
  Administered 2015-08-22: 500 [IU] via INTRAVENOUS
  Filled 2015-08-22: qty 5

## 2015-08-22 MED ORDER — AZITHROMYCIN 200 MG/5ML PO SUSR: 500.0000 mg | Freq: Every day | ORAL | Status: DC

## 2015-08-22 NOTE — Patient Instructions (Signed)
[facility] Discharge Instructions for Patients Receiving Chemotherapy  Today you received the following chemotherapy agents: Herceptin   To help prevent nausea and vomiting after your treatment, we encourage you to take your nausea medication as directed.    If you develop nausea and vomiting that is not controlled by your nausea medication, call the clinic.   BELOW ARE SYMPTOMS THAT SHOULD BE REPORTED IMMEDIATELY:  *FEVER GREATER THAN 100.5 F  *CHILLS WITH OR WITHOUT FEVER  NAUSEA AND VOMITING THAT IS NOT CONTROLLED WITH YOUR NAUSEA MEDICATION  *UNUSUAL SHORTNESS OF BREATH  *UNUSUAL BRUISING OR BLEEDING  TENDERNESS IN MOUTH AND THROAT WITH OR WITHOUT PRESENCE OF ULCERS  *URINARY PROBLEMS  *BOWEL PROBLEMS  UNUSUAL RASH Items with * indicate a potential emergency and should be followed up as soon as possible.  Feel free to call the clinic you have any questions or concerns. The clinic phone number is [phone].  Please show the CHEMO ALERT CARD at check-in to the Emergency Department and triage nurse.   

## 2015-08-22 NOTE — Telephone Encounter (Signed)
Scheduled patient appt per pof, avs report printed.  °

## 2015-08-22 NOTE — Telephone Encounter (Signed)
Patient called asking to be scheduled to see Dr. Burr Medico today.  Appointments re-scheduled to today do not include provider F/U.  Dr. Burr Medico notified.  Verbal order received and read back from Dr. Burr Medico for patient to be scheduled today at 12:00 pm.  Elmyra Ricks notified at this time.

## 2015-08-23 ENCOUNTER — Encounter: Payer: Self-pay | Admitting: Radiation Oncology

## 2015-08-23 ENCOUNTER — Encounter: Payer: Self-pay | Admitting: Hematology

## 2015-08-23 NOTE — Progress Notes (Signed)
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 MRI breast on 07/16/2014 showed 7cm R breast lesion and 4.4cm r axillary node    07/02/2014 Imaging CT CAP: a 4.7cm mass in LUL lung and a 2.1cm mas in RML, and a small nodule in RUL, suspecious for metastasis     07/09/2014 Initial Diagnosis right IDA with b/l lung lesions, ER-/PR-/HER2+   07/09/2014 Initial Biopsy US guided right breast mass and axillary node biopsy showed IDA, and DCIS, ER-/PR-/HER2+   07/26/2014 Pathologic Stage Left lung mass by IR, path revealed high grade carcinoma, morphology similar to breast tumor biopsy, TTF(-), NapsinA(-), ER(-)   08/04/2014 - 03/22/2015 Chemotherapy weekly Paclitaxel '80mg'$ /m2, trastuzumab and pertuzumab every 3 weeks   10/04/2014 Imaging Interval decrease in the right axillary lymphadenopathy. Bilateral pulmonary lesions with left hilar lymphadenopathy also markedly decreased in the interval. The left hilar lymphadenopathy has resolved.   12/20/2014 Imaging restaging CT showed stable disease, no new lesions    03/29/2015 Pathology Results  right breast lumpectomy showed  chemotherapy treatment effect,  a 1 mm residual tumor,   margins were widely negative, 5 sentinel lymph nodes and 2 axillary lymph  nodes were negative.    03/29/2015 Surgery  right breast lumpectomy and sentinel lymph node biopsy  by Dr. Barry Dienes   04/12/2015 -  Chemotherapy  Herceptin maintenance therapy , 6 mg/kg, every 3 weeks   05/03/2015 - 06/14/2015 Radiation Therapy right breast adjuvant irradiation by Dr. Valere Dross     CURRENT THERAPY: Herceptin maintenance therapy every 3 weeks, started on 04/12/2015  INTERVAL HISTROY   [name] [name] [name] returns for follow-up and Herceptin treatment. She started having cough with clear mucus production about one months ago. Her husband got a cold around same time. She initially took some over-the-counter medicine, but her cough got worse and she started having yellownish sputum production, and probably cough related headache and mild right rib pain, along with fatigue and low appetite. No fever or chills, no significant shortness of breath, or chest congestion. She was seen in the emergency room on 08/13/2015, and was prescribed Levaquin and cough syrup. Her cough has slightly improved with cough syrup, headaches also improved. She called last week and we postponed her Herceptin treatment to this week.  MEDICAL HISTORY:  Past Medical History  Diagnosis Date  . Breast cancer ([place])   . Pneumonia     hx of pneumonia 08/2013   . GERD (gastroesophageal reflux disease)     during pregnancy   . Breast cancer ([place])   . S/P radiation therapy 05/03/2015 through 06/14/2015     Right breast 4680 cGy in 26 sessions, right breast boost 1000 cGy in 5 sessions. Right supraclavicular/axillary region 4680 cGy with a supplemental PA field to bring the axillary dose up to 4500 cGy in  26 sessions    SURGICAL HISTORY: Past Surgical History  Procedure Laterality Date  . Cesarean section    . Essure tubal ligation    . Cholecystectomy    . Wisdom tooth extraction    . Portacath placement Left 07/21/2014    Procedure: INSERTION PORT-A-CATH;  Surgeon: Stark Klein,  MD;  Location: [facility];  Service: General;  Laterality: Left;  . Breast lumpectomy with radioactive seed and sentinel lymph node biopsy Right 03/29/2015    Procedure: BREAST LUMPECTOMY WITH RADIOACTIVE SEED AND SENTINEL LYMPH NODE BIOPSY;  Surgeon: Stark Klein, MD;  Location: [place];  Service: General;  Laterality: Right;    SOCIAL HISTORY: History   Social History  . Marital Status: Married    Spouse Name: N/A    Number of Children:  she has 2 daughters at the age of 17 and 61.   . Years of Education: N/A   Occupational History  .  works as Freight forwarder for a [employer].    Social History Main Topics  . Smoking status: Never Smoker   . Smokeless tobacco: Not on file  . Alcohol Use: Yes  . Drug Use: No  . Sexual Activity: No    FAMILY HISTORY: Family History  Problem Relation Age of Onset  . Breast cancer Mother 57  . Liver cancer Father 9  . Breast cancer Maternal Aunt 36  . Prostate cancer Maternal Grandfather   . Liver cancer Paternal Grandmother   . Prostate cancer Paternal Grandfather      GENETICS: 08/30/2014 BreastNext panel was negative. 17 genes including BRCA1, BRCA2, were negative for mutations.   ALLERGIES:  is allergic to adhesive; aspirin; and codeine.  MEDICATIONS:  Current Outpatient Prescriptions  Medication Sig Dispense Refill  . Camphor-Eucalyptus-Menthol (VICKS VAPORUB EX) Apply 1 application topically at bedtime. Applies under nose, throat and on chest.    . cetirizine (ZYRTEC) 10 MG tablet Take 10 mg by mouth daily as needed for allergies (allergies).     . chlorpheniramine-HYDROcodone (TUSSIONEX PENNKINETIC ER) 10-8 MG/5ML SUER Take 5 mLs by mouth every 12 (twelve) hours as needed for cough. 115 mL 0  . clindamycin (CLINDAGEL) 1 % gel Apply topically to affected areas BID. 60 g 3  . emollient (BIAFINE) cream Apply 1 application topically 2 (two) times daily.    . fluconazole (DIFLUCAN) 150 MG tablet TK PO ONCE  4  .  fluticasone (FLONASE) 50 MCG/ACT nasal spray   1  . hydrocortisone 2.5 % cream APPLY TOPICALLY TO THE AFFECTED AREA TWICE DAILY 454 g 0  . lidocaine-prilocaine (EMLA) cream Apply topically as needed. 30 g 2  . loperamide (IMODIUM A-D) 2 MG tablet Take 1 tablet (2 mg total) by mouth 4 (four) times daily as needed for diarrhea or loose stools. 30 tablet 0  . LORazepam (ATIVAN) 1 MG tablet Take 1 tablet (1 mg total) by mouth once. 2 tablet 0  . non-metallic deodorant (ALRA) MISC Apply 1 application topically daily as needed.    . ondansetron (ZOFRAN) 8 MG tablet Take 1 tablet (8 mg total) by mouth every 8 (eight) hours as needed for nausea or vomiting. 30 tablet 3  . oxyCODONE-acetaminophen (ROXICET) 5-325 MG per tablet Take 1-2 tablets by mouth every 4 (four) hours as needed for severe pain. 30 tablet 0  . pantoprazole (PROTONIX) 20 MG tablet Take 1 tablet (20 mg total) by mouth daily. 30 tablet 3  . promethazine (PHENERGAN) 6.25 MG/5ML syrup Take 6.25 mg  by mouth every 6 (six) hours as needed for nausea or vomiting.    . Pseudoeph-Doxylamine-DM-APAP (NYQUIL PO) Take 30 mLs by mouth 2 (two) times daily as needed (cold/allergies).     Marland Kitchen azithromycin (ZITHROMAX) 200 MG/5ML suspension Take 12.5 mLs (500 mg total) by mouth daily. 60 mL 0   No current facility-administered medications for this visit.    REVIEW OF SYSTEMS:   Constitutional: Denies fevers, chills or abnormal night sweats, (+) malaise  Eyes: Denies blurriness of vision, double vision or watery eyes Ears, nose, mouth, throat, and face: Denies mucositis or sore throat Respiratory: (+) productive cough, no dyspnea or wheezes Cardiovascular: Denies palpitation, chest discomfort or lower extremity swelling Gastrointestinal:  Denies nausea, heartburn or change in bowel habits Skin:(+)  skin pigmentation on bilateral forearms and left elbow Lymphatics: Denies new lymphadenopathy or easy bruising Neurological:Denies numbness, tingling or new  weaknesses Behavioral/Psych: Mood is stable, no new changes  All other systems were reviewed with the patient and are negative.  PHYSICAL EXAMINATION: ECOG PERFORMANCE STATUS: 1 BP 116/88 mmHg  Pulse 85  Temp(Src) 98.9 F (37.2 C) (Oral)  Resp 18  Ht _0  (1.778 m)  Wt 207 lb 3.2 oz (93.985 kg)  BMI 29.73 kg/m2  SpO2 98% GENERAL:alert, no distress and comfortable SKIN: skin color, texture, turgor are normal, no rashes or significant lesions EYES: normal, conjunctiva are pink and non-injected, sclera clear OROPHARYNX:no exudate, no erythema and lips, buccal mucosa, and tongue normal  NECK: supple, thyroid normal size, non-tender, without nodularity LYMPH:  no palpable lymphadenopathy in the cervical, axillary or inguinal LUNGS: clear to auscultation and percussion with normal breathing effort HEART: regular rate & rhythm and no murmurs and no lower extremity edema ABDOMEN:abdomen soft, non-tender and normal bowel sounds Musculoskeletal:no cyanosis of digits and no clubbing  PSYCH: alert & oriented x 3 with fluent speech NEURO: no focal motor/sensory deficits Breasts:  Status post right breast lumpectomy and sentinel lymph node biopsy, incisions have healed well. There is a 5cm lump above her breast incision site, non-tender. (+) Moderate skin pigmentation of right breast secondary to radiation, previous skin ulcers have healed. lcers under her right breast. Palpation of left breast  and axilla revealed no other obvious mass that I could appreciate. Skin: (+) Skin pigmentation on hands and feet, lighter than before. No skin rashes. (+)  pigmentation on fingernails, and bulging of fingernails and thickening of soft tissue under nailbed.  LABORATORY DATA:  I have reviewed the data as listed CBC Latest Ref Rng 08/22/2015 08/13/2015 07/26/2015  WBC 3.9 - 10.3 10e3/uL 5.7 6.0 6.0  Hemoglobin 11.6 - 15.9 g/dL 12.9 11.4(L) 11.6  Hematocrit 34.8 - 46.6 % 39.4 35.8(L) 35.3  Platelets 145 -  400 10e3/uL 308 285 212    CMP Latest Ref Rng 08/22/2015 08/13/2015 07/26/2015  Glucose 70 - 140 mg/dl 95 96 85  BUN 7.0 - 26.0 mg/dL 15.1 7 12.5  Creatinine 0.6 - 1.1 mg/dL 0.9 0.76 0.8  Sodium 136 - 145 mEq/L 138 139 139  Potassium 3.5 - 5.1 mEq/L 3.9 3.4(L) 3.8  Chloride 101 - 111 mmol/L - 105 -  CO2 22 - 29 mEq/L _1 Calcium 8.4 - 10.4 mg/dL 9.5 8.7(L) 9.3  Total Protein 6.4 - 8.3 g/dL 8.5(H) 7.6 7.4  Total Bilirubin 0.20 - 1.20 mg/dL 0.61 0.6 0.36  Alkaline Phos 40 - 150 U/L 89 77 79  AST 5 - 34 U/L _2 ALT 0 - 55 U/L  _0 PATHOLOGY REPORT 07/09/2014 #1 breast, right needle core biopsy more anterior -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 45)  Lung, biopsy, Left 07/26/2014 - HIGH GRADE CARCINOMA, SEE COMMENT. Microscopic Comment The carcinoma demonstrates the following immunophenotype: TTF-1 - negative expression. Napsin A- negative expression. CK5/6 - focal, moderate expression. estrogen receptor - negative expression. GCDFP- negative expression. The recent breast biopsy demonstrating Her2 amplified high grade invasive mammary carcinoma is noted ([id number]). Although the immunophenotype of the current case is non-sepcific, it strongly argues against primary lung adenocarcinoma. However, on re-review, the morphology of the current case is essentially identical to the primary mammary carcinoma. In lieu of further immunophenotyping, the tumor will be submitted for Her2 testing and the remaining tissue will be reserved for additional ancillary tumor testing. The results of the Her2 testing will be reported in an addendum. The case was discussed with Dr Burr Medico on 07/28/2015 and 07/29/2015 HER2 POSITIVE   Diagnosis 03/29/2015 1.  Breast, lumpectomy, Right - INVASIVE DUCTAL CARCINOMA, SEE COMMENT. - SEE TUMOR SYNOPTIC TEMPLATE BELOW. 2. Lymph node, sentinel, biopsy, Right axillary #1 - ONE LYMPH NODE, NEGATIVE FOR TUMOR (0/1). - SEE COMMENT. 3. Lymph node, sentinel, biopsy, Right axillary #2 - BENIGN FIBROFATTY SOFT TISSUE. - NEGATIVE FOR ATYPIA OR MALIGNANCY. - NEGATIVE FOR LYMPH NODE. 4. Lymph node, sentinel, biopsy, Right axillary #3 - ONE LYMPH NODE, NEGATIVE FOR TUMOR (0/1). - SEE COMMENT. 5. Lymph node, sentinel, biopsy, Right axillary #4 - ONE LYMPH NODE, NEGATIVE FOR TUMOR (0/1). - SEE COMMENT. 6. Lymph node, sentinel, biopsy, Right axillary #5 - ONE LYMPH NODE, NEGATIVE FOR TUMOR (0/1). - SEE COMMENT. 7. Lymph node, sentinel, biopsy, right axillary - ONE LYMPH NODE, NEGATIVE FOR TUMOR (0/1). - SEE COMMENT. 8. Lymph node, sentinel, biopsy, right axillary - ONE LYMPH NODE, NEGATIVE FOR TUMOR (0/1). - SEE COMMENT. 1 of 4 Amended copy Amended FINAL for GWENNETH, WHITEMAN ([id number]) Microscopic Comment 1. BREAST, INVASIVE TUMOR, WITH LYMPH NODES PRESENT Specimen, including laterality and lymph node sampling (sentinel, non-sentinel): Right breast with sentinel and non-sentinel lymph node sampling Procedure: Lumpectomy Histologic type: Ductal, see comment Grade: 2 of 3, see comment. Tubule formation: 3 Nuclear pleomorphism: 3 Mitotic: 1 Tumor size (glass slide measurement): 1 mm, see comment Margins: Invasive, distance to closest margin: See comment In-situ, distance to closest margin: N/A If margin positive, focally or broadly: N/A Lymphovascular invasion: Absent Ductal carcinoma in situ: Absent Grade: N/A Extensive intraductal component: N/A Lobular neoplasia: Absent Tumor focality: Unifocal, see comment Treatment effect: Present If present, treatment effect in breast tissue, lymph nodes or both: Both breast tissue and lymph nodes Extent of tumor: Skin: N/A Nipple: N/A Skeletal  muscle: N/A Lymph nodes: Examined: 5 Sentinel 2 Non-sentinel 7 Total Lymph nodes with metastasis: 0 Isolated tumor cells (< 0.2 mm): N/A Micrometastasis: (> 0.2 mm and < 2.0 mm): N/A Macrometastasis: (> 2.0 mm): N/A Extracapsular extension: N/A Breast prognostic profile: Estrogen receptor: Not repeated, previous study demonstrated 0% positivity ([id number]). Progesterone receptor: Not repeated, previous study demonstrated 0% positivity ([id number]) Her 2 neu: Demonstrates amplificaition ([id number]) Ki-67: Not repeated, previous study demonstrate 79% proliferation rate ([id number]). Non-neoplastic breast: Neoadjuvant related tissue changes and adenosis. TNM: ypT14m, ypN0, pMX Comments: Numerous representative sections including the 2.0 cm hemorrhagic tissue associated with X-shaped biopsy clip,  the 2.0 cm ill defined lesion associated with M-shaped clip, and intervening tissue were submitted for review. Slide sections from the tissue surrounding the X-shaped and M-shaped clips demonstrate tissue changes consistent with neoadjuvant related change. There is no invasive or in situ carcinom present at either site. However, in a representative section from the intervening tissue (slide G), there is a 1 mm focus of high grade invasive ductal carcinoma present. Given the minute size of the tumor, tumor grading is limited. The tumor is present within non-marginal tissue sections and is considered to be at least 0.5 cm from the nearest margin (medial).   Note: Amendment issued for modification of synoptic table, inadvertent typographical error. The change in the synoptic table was discussed with Dr Burr Medico on 04/07/2015. (CRR:ecj 04/07/2015) 2. , 4-8. In parts 2 and 4, there is extensive neoadjuvant related tissue changes including abundant foamy macrophages, fibroinflammatory reaction and dystrophic calcifications. The neoadjuvant tissue changes extend into perinodal soft tissue and are either  focally or broadly present at the cauterized edge of the tissue submitted. There are no definitive features of malignancy present.  RADIOGRAPHIC STUDIES:  CXR 08/13/2015 IMPRESSION: 1. Suspect right middle lobe infiltrate, likely infectious. Followup PA and lateral chest X-ray is recommended in 3-4 weeks following trial of antibiotic therapy to ensure resolution and exclude underlying malignancy. 2. Right apical density has increased over prior plain films, raising question of increased size of right apical nodule. If there is persistence of this opacity on follow-up exams, follow-up CT chest is recommended.  ECHO 06/09/2015 Study Conclusion - Left ventricle: The cavity size was normal. Global longitudinal strain -21.8%. Systolic function was vigorous. The estimated ejection fraction was in the range of 65% to 70%. Wall motion was normal; there were no regional wall motion abnormalities. Left ventricular diastolic function parameters were normal. - Aortic valve: Transvalvular velocity was within the normal range. There was no stenosis. There was no regurgitation. - Mitral valve: There was mild regurgitation. - Right ventricle: The cavity size was normal. Wall thickness was normal. Systolic function was normal. - Tricuspid valve: There was no regurgitation. - Pulmonic valve: There was trivial regurgitation.   ASSESSMENT & PLAN:  45 year old African-American female, premenopausal, without significant past medical history, presented with palpable right breast mass and right axillary mass.   1. 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 - I reviewed her breasts surgical pathology results with her in great detail ,  She has had fantastic partial response (near complete  response) form 8 months of chemotherapy and antibody therapy.   The initial 6.3 cm breast mass has only 1 mm residual tumor,  And the previously 4.4 cm right axillary lymph node has had complete  Pathological response, all 7 nodes were negative for cancer. -I reviewed her restaging CT scans from 07/04/15, no evidence of new lesion except an indeterminate RUL subpleural nodule which is new. The right breast lump is a seroma. -Given her persistent for my symptoms, I'll move her restaging CT scan up to this week.  -continue herceptin for now, lab reviewed, CBC, CMP normal.   2. Productive cough  -Her chest x-ray reviewed some infiltrates in the right middle lobe on 08/13/2015. -She has completed 10 days of Levaquin. -I give her a prescription of azithromycin to cover atypical and pneumonia. -I reviewed  her cough syrup today.  3. Right breast mass at incision site -This is a seroma from her breast surgery, confirmed by CT   4. G1 peripheral neuropathy, skin pigmentation, hands and feet -improving. We'll continue to monitor closely.   Plan for today  -Continue Herceptin every 3 weeks, treatment today. -zithromycin 500 mg daily for 5 days  -move up her restaging CT scan to this week  -I'll see her back in 3 weeks, or sooner if her CT scan is concerning for disease progression     I spent about 20 minutes counseling the patient and her family members, total care was about 25 minutes.  [name]  08/23/2015

## 2015-08-23 NOTE — Progress Notes (Signed)
Paperwork received from [employer], given to RN 08/22/14 Ardeen Fillers)

## 2015-08-25 ENCOUNTER — Ambulatory Visit (HOSPITAL_COMMUNITY)
Admission: RE | Admit: 2015-08-25 | Discharge: 2015-08-25 | Disposition: A | Discharge status: Home / Self Care | Payer: 59 | Source: Ambulatory Visit | Attending: Hematology | Admitting: Hematology

## 2015-08-25 ENCOUNTER — Encounter: Payer: Self-pay | Admitting: Hematology

## 2015-08-25 ENCOUNTER — Encounter (HOSPITAL_COMMUNITY): Payer: Self-pay

## 2015-08-25 DIAGNOSIS — C50911 Malignant neoplasm of unspecified site of right female breast: Secondary | ICD-10-CM | POA: Diagnosis not present

## 2015-08-25 DIAGNOSIS — Z9221 Personal history of antineoplastic chemotherapy: Secondary | ICD-10-CM | POA: Diagnosis not present

## 2015-08-25 DIAGNOSIS — L7634 Postprocedural seroma of skin and subcutaneous tissue following other procedure: Secondary | ICD-10-CM | POA: Insufficient documentation

## 2015-08-25 DIAGNOSIS — Z923 Personal history of irradiation: Secondary | ICD-10-CM | POA: Diagnosis not present

## 2015-08-25 DIAGNOSIS — C7800 Secondary malignant neoplasm of unspecified lung: Secondary | ICD-10-CM | POA: Diagnosis present

## 2015-08-25 DIAGNOSIS — Z9889 Other specified postprocedural states: Secondary | ICD-10-CM | POA: Insufficient documentation

## 2015-08-25 DIAGNOSIS — K5730 Diverticulosis of large intestine without perforation or abscess without bleeding: Secondary | ICD-10-CM | POA: Insufficient documentation

## 2015-08-25 MED ORDER — IOHEXOL 300 MG/ML  SOLN
50.0000 mL | Freq: Once | INTRAMUSCULAR | Status: AC | PRN
  Administered 2015-08-25: 100 mL via INTRAVENOUS

## 2015-08-25 MED ORDER — IOHEXOL 300 MG/ML  SOLN
50.0000 mL | Freq: Once | INTRAMUSCULAR | Status: AC | PRN
  Administered 2015-08-25: 50 mL via ORAL

## 2015-08-25 NOTE — Progress Notes (Signed)
I placed [employer] forms for dr. Burr Medico to sign and will let patient know when done and faxed

## 2015-08-26 ENCOUNTER — Encounter: Payer: Self-pay | Admitting: Hematology

## 2015-08-26 NOTE — Progress Notes (Signed)
I let patient know form were faxed [phone] and she wants emailed to her also. I noted sharepoint

## 2015-08-28 ENCOUNTER — Other Ambulatory Visit: Payer: Self-pay | Admitting: Hematology

## 2015-08-31 ENCOUNTER — Telehealth: Payer: Self-pay | Admitting: *Deleted

## 2015-08-31 NOTE — Telephone Encounter (Signed)
Pt called to see if Dr Burr Medico has decided about the steroid treatment they discussed. Was going to talk with RT and call her back.

## 2015-09-01 ENCOUNTER — Other Ambulatory Visit: Payer: Self-pay | Admitting: Hematology

## 2015-09-01 ENCOUNTER — Ambulatory Visit: Payer: 59 | Admitting: Hematology

## 2015-09-01 ENCOUNTER — Ambulatory Visit: Payer: BLUE CROSS/BLUE SHIELD | Admitting: Hematology

## 2015-09-01 ENCOUNTER — Other Ambulatory Visit: Payer: Self-pay | Admitting: *Deleted

## 2015-09-01 MED ORDER — PREDNISONE 10 MG (21) PO TBPK: 10.0000 mg | ORAL_TABLET | Freq: Every day | ORAL | Status: DC

## 2015-09-01 NOTE — Telephone Encounter (Signed)
I have called pt back and called in prednisone, instruction was given to pt and she voiced good understanding.  [name]  09/01/2015

## 2015-09-02 ENCOUNTER — Telehealth: Payer: Self-pay | Admitting: Hematology

## 2015-09-02 NOTE — Telephone Encounter (Signed)
Left message for patient in regards to 1/30 appointments.

## 2015-09-05 ENCOUNTER — Ambulatory Visit (HOSPITAL_COMMUNITY): Payer: BLUE CROSS/BLUE SHIELD

## 2015-09-06 ENCOUNTER — Ambulatory Visit: Payer: BLUE CROSS/BLUE SHIELD | Admitting: Hematology

## 2015-09-06 ENCOUNTER — Ambulatory Visit: Payer: BLUE CROSS/BLUE SHIELD

## 2015-09-06 ENCOUNTER — Other Ambulatory Visit: Payer: BLUE CROSS/BLUE SHIELD

## 2015-09-07 ENCOUNTER — Telehealth: Payer: Self-pay | Admitting: Hematology

## 2015-09-07 ENCOUNTER — Telehealth: Payer: Self-pay | Admitting: *Deleted

## 2015-09-07 NOTE — Telephone Encounter (Signed)
pt cld to r/s appt to 1/31-sent MW email to see if flush and trmt can be sch for 1/31-will call pt after reply

## 2015-09-07 NOTE — Telephone Encounter (Signed)
per pof to sch pt appt-per reply cld pt and gave pt time & date of r/s appt

## 2015-09-07 NOTE — Telephone Encounter (Signed)
Per staff message and POF I have scheduled appts. Advised scheduler of appts. JMW  

## 2015-09-12 ENCOUNTER — Ambulatory Visit: Payer: BC Managed Care – PPO | Admitting: Hematology

## 2015-09-12 ENCOUNTER — Ambulatory Visit: Payer: BC Managed Care – PPO

## 2015-09-12 ENCOUNTER — Other Ambulatory Visit: Payer: Self-pay

## 2015-09-13 ENCOUNTER — Ambulatory Visit: Payer: 59

## 2015-09-13 ENCOUNTER — Encounter: Payer: Self-pay | Admitting: Hematology

## 2015-09-13 ENCOUNTER — Ambulatory Visit (HOSPITAL_BASED_OUTPATIENT_CLINIC_OR_DEPARTMENT_OTHER): Payer: 59 | Admitting: Hematology

## 2015-09-13 ENCOUNTER — Other Ambulatory Visit (HOSPITAL_BASED_OUTPATIENT_CLINIC_OR_DEPARTMENT_OTHER): Payer: 59

## 2015-09-13 ENCOUNTER — Ambulatory Visit (HOSPITAL_BASED_OUTPATIENT_CLINIC_OR_DEPARTMENT_OTHER): Payer: 59

## 2015-09-13 VITALS — BP 126/88 | HR 72 | Temp 98.5°F | Resp 16 | Ht 70.0 in | Wt 211.4 lb

## 2015-09-13 DIAGNOSIS — Z5112 Encounter for antineoplastic immunotherapy: Secondary | ICD-10-CM

## 2015-09-13 DIAGNOSIS — C7801 Secondary malignant neoplasm of right lung: Secondary | ICD-10-CM

## 2015-09-13 DIAGNOSIS — C773 Secondary and unspecified malignant neoplasm of axilla and upper limb lymph nodes: Secondary | ICD-10-CM | POA: Diagnosis not present

## 2015-09-13 DIAGNOSIS — C7800 Secondary malignant neoplasm of unspecified lung: Principal | ICD-10-CM

## 2015-09-13 DIAGNOSIS — C50911 Malignant neoplasm of unspecified site of right female breast: Secondary | ICD-10-CM | POA: Diagnosis not present

## 2015-09-13 DIAGNOSIS — R05 Cough: Secondary | ICD-10-CM

## 2015-09-13 DIAGNOSIS — G620 Drug-induced polyneuropathy: Secondary | ICD-10-CM

## 2015-09-13 DIAGNOSIS — C7802 Secondary malignant neoplasm of left lung: Secondary | ICD-10-CM

## 2015-09-13 DIAGNOSIS — Z95828 Presence of other vascular implants and grafts: Secondary | ICD-10-CM

## 2015-09-13 DIAGNOSIS — D630 Anemia in neoplastic disease: Secondary | ICD-10-CM

## 2015-09-13 DIAGNOSIS — G629 Polyneuropathy, unspecified: Secondary | ICD-10-CM

## 2015-09-13 DIAGNOSIS — T451X5A Adverse effect of antineoplastic and immunosuppressive drugs, initial encounter: Secondary | ICD-10-CM

## 2015-09-13 LAB — COMPREHENSIVE METABOLIC PANEL
ALT: 23 U/L (ref 0–55)
ANION GAP: 10 meq/L (ref 3–11)
AST: 17 U/L (ref 5–34)
Albumin: 3.7 g/dL (ref 3.5–5.0)
Alkaline Phosphatase: 80 U/L (ref 40–150)
BILIRUBIN TOTAL: 0.5 mg/dL (ref 0.20–1.20)
BUN: 22 mg/dL (ref 7.0–26.0)
CO2: 24 meq/L (ref 22–29)
CREATININE: 0.9 mg/dL (ref 0.6–1.1)
Calcium: 9.5 mg/dL (ref 8.4–10.4)
Chloride: 104 mEq/L (ref 98–109)
EGFR: >90 mL/min/{1.73_m2} (ref >90)
GLUCOSE: 102 mg/dL (ref 70–140)
Potassium: 3.8 mEq/L (ref 3.5–5.1)
SODIUM: 139 meq/L (ref 136–145)
TOTAL PROTEIN: 7.9 g/dL (ref 6.4–8.3)

## 2015-09-13 LAB — CBC WITH DIFFERENTIAL/PLATELET
BASO%: 0.3 % (ref 0.0–2.0)
Basophils Absolute: 0 10*3/uL (ref 0.0–0.1)
EOS%: 1.6 % (ref 0.0–7.0)
Eosinophils Absolute: 0.1 10*3/uL (ref 0.0–0.5)
HCT: 38.1 % (ref 34.8–46.6)
HGB: 12.3 g/dL (ref 11.6–15.9)
LYMPH%: 20.9 % (ref 14.0–49.7)
MCH: 28.5 pg (ref 25.1–34.0)
MCHC: 32.3 g/dL (ref 31.5–36.0)
MCV: 88.2 fL (ref 79.5–101.0)
MONO#: 0.6 10*3/uL (ref 0.1–0.9)
MONO%: 9.3 % (ref 0.0–14.0)
NEUT%: 67.9 % (ref 38.4–76.8)
NEUTROS ABS: 4.6 10*3/uL (ref 1.5–6.5)
PLATELETS: 199 10*3/uL (ref 145–400)
RBC: 4.32 10*6/uL (ref 3.70–5.45)
RDW: 13.6 % (ref 11.2–14.5)
WBC: 6.8 10*3/uL (ref 3.9–10.3)
lymph#: 1.4 10*3/uL (ref 0.9–3.3)

## 2015-09-13 MED ORDER — HEPARIN SOD (PORK) LOCK FLUSH 100 UNIT/ML IV SOLN
500.0000 [IU] | Freq: Once | INTRAVENOUS | Status: AC | PRN
  Administered 2015-09-13: 500 [IU]
  Filled 2015-09-13: qty 5

## 2015-09-13 MED ORDER — SODIUM CHLORIDE 0.9% FLUSH
10.0000 mL | INTRAVENOUS | Status: DC | PRN
  Administered 2015-09-13: 10 mL via INTRAVENOUS
  Filled 2015-09-13: qty 10

## 2015-09-13 MED ORDER — ACETAMINOPHEN 325 MG PO TABS
ORAL_TABLET | ORAL | Status: AC
  Filled 2015-09-13: qty 2

## 2015-09-13 MED ORDER — SODIUM CHLORIDE 0.9 % IV SOLN
6.0000 mg/kg | Freq: Once | INTRAVENOUS | Status: AC
  Administered 2015-09-13: 567 mg via INTRAVENOUS
  Filled 2015-09-13: qty 27

## 2015-09-13 MED ORDER — SODIUM CHLORIDE 0.9 % IV SOLN
Freq: Once | INTRAVENOUS | Status: AC
  Administered 2015-09-13: 10:00:00 via INTRAVENOUS

## 2015-09-13 MED ORDER — ALBUTEROL SULFATE HFA 108 (90 BASE) MCG/ACT IN AERS: 2.0000 | INHALATION_SPRAY | Freq: Four times a day (QID) | RESPIRATORY_TRACT | Status: DC | PRN

## 2015-09-13 MED ORDER — ACETAMINOPHEN 325 MG PO TABS
650.0000 mg | ORAL_TABLET | Freq: Once | ORAL | Status: AC
  Administered 2015-09-13: 650 mg via ORAL

## 2015-09-13 MED ORDER — SODIUM CHLORIDE 0.9 % IJ SOLN
10.0000 mL | INTRAMUSCULAR | Status: DC | PRN
  Administered 2015-09-13: 10 mL
  Filled 2015-09-13: qty 10

## 2015-09-13 NOTE — Patient Instructions (Signed)

## 2015-09-13 NOTE — Patient Instructions (Signed)
[facility] Discharge Instructions for Patients Receiving Chemotherapy  Today you received the following chemotherapy agents: Herceptin   To help prevent nausea and vomiting after your treatment, we encourage you to take your nausea medication as directed.    If you develop nausea and vomiting that is not controlled by your nausea medication, call the clinic.   BELOW ARE SYMPTOMS THAT SHOULD BE REPORTED IMMEDIATELY:  *FEVER GREATER THAN 100.5 F  *CHILLS WITH OR WITHOUT FEVER  NAUSEA AND VOMITING THAT IS NOT CONTROLLED WITH YOUR NAUSEA MEDICATION  *UNUSUAL SHORTNESS OF BREATH  *UNUSUAL BRUISING OR BLEEDING  TENDERNESS IN MOUTH AND THROAT WITH OR WITHOUT PRESENCE OF ULCERS  *URINARY PROBLEMS  *BOWEL PROBLEMS  UNUSUAL RASH Items with * indicate a potential emergency and should be followed up as soon as possible.  Feel free to call the clinic you have any questions or concerns. The clinic phone number is [phone].  Please show the CHEMO ALERT CARD at check-in to the Emergency Department and triage nurse.   

## 2015-09-13 NOTE — Progress Notes (Signed)
[facility] Follow up note   Patient Care Team: Pcp Not In System as PCP - General [name], DO (Family Medicine) Holley Bouche, NP as Nurse Practitioner (Nurse Practitioner) Stark Klein, MD as Consulting Physician (General Surgery) Arloa Koh, MD as Consulting Physician (Radiation Oncology) [name], MD as Consulting Physician (Hematology)  CHIEF COMPLAINTS Follow up metastatic breast cancer  Oncology History   Breast cancer 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   Staging form: Breast, AJCC 7th Edition     Clinical stage from 07/22/2014: Stage IV (T3, N1, M1) - Unsigned       Breast cancer 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 ([place])   07/02/2014 Mammogram Mammogram showed a 2cm right beast mass and a 1.8cm right axillary node. 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 Bilateral pulmonary lesions with left hilar lymphadenopathy also markedly decreased in the interval. The left hilar lymphadenopathy has resolved.   12/20/2014 Imaging restaging CT showed stable disease, no new lesions    03/29/2015 Pathology Results  right breast lumpectomy showed  chemotherapy treatment effect,  a 1 mm residual tumor,   margins were widely negative, 5 sentinel lymph nodes and 2 axillary lymph  nodes were negative.    03/29/2015 Surgery  right breast lumpectomy and sentinel lymph node biopsy  by Dr. BBarry Dienes  04/12/2015 -  Chemotherapy  Herceptin maintenance therapy , 6 mg/kg, every 3 weeks   05/03/2015 - 06/14/2015 Radiation Therapy right breast adjuvant irradiation by Dr. MValere Dross    CURRENT THERAPY: Herceptin maintenance therapy every 3 weeks, started on 04/12/2015  INTERVAL HISTROY   [place] and Herceptin treatment. I called her last week on with the CT scan results, and according prednisone with tapering dose for 10 days. She responded well, her cough has significantly improved, no significant dyspnea, chest pain, or other symptoms. She overall feels much better also. She plans to return to work today after her Herceptin treatment.  MEDICAL HISTORY:  Past Medical History  Diagnosis Date  . Pneumonia     hx of pneumonia 08/2013   . GERD (gastroesophageal reflux disease)     during pregnancy   . S/P radiation therapy 05/03/2015 through 06/14/2015     Right breast 4680 cGy in 26 sessions, right breast boost 1000 cGy in 5 sessions. Right supraclavicular/axillary region 4680 cGy with a supplemental PA field to bring the axillary dose up to 4500 cGy in 26 sessions  . [place]   . [facility]     SURGICAL HISTORY: Past Surgical History  Procedure Laterality Date  . Cesarean section    . Essure tubal ligation    . Cholecystectomy    . Wisdom tooth extraction    . Portacath placement Left 07/21/2014    Procedure: INSERTION PORT-A-CATH;  Surgeon:  Stark Klein, MD;  Location: [facility];  Service: General;  Laterality: Left;  . Breast lumpectomy with radioactive seed and sentinel lymph node biopsy Right 03/29/2015    Procedure: BREAST LUMPECTOMY WITH RADIOACTIVE SEED AND SENTINEL LYMPH NODE BIOPSY;  Surgeon: Stark Klein, MD;  Location: [place];  Service: General;  Laterality: Right;     SOCIAL HISTORY: History   Social History  . Marital Status: Married    Spouse Name: N/A    Number of Children:  she has 2 daughters at the age of 80 and 80.   . Years of Education: N/A   Occupational History  .  works as Freight forwarder for a [employer].    Social History Main Topics  . Smoking status: Never Smoker   . Smokeless tobacco: Not on file  . Alcohol Use: Yes  . Drug Use: No  . Sexual Activity: No    FAMILY HISTORY: Family History  Problem Relation Age of Onset  . Breast cancer Mother 37  . Liver cancer Father 71  . Breast cancer Maternal Aunt 36  . Prostate cancer Maternal Grandfather   . Liver cancer Paternal Grandmother   . Prostate cancer Paternal Grandfather      GENETICS: 08/30/2014 BreastNext panel was negative. 17 genes including BRCA1, BRCA2, were negative for mutations.   ALLERGIES:  is allergic to adhesive; aspirin; and codeine.  MEDICATIONS:  Current Outpatient Prescriptions  Medication Sig Dispense Refill  . Camphor-Eucalyptus-Menthol (VICKS VAPORUB EX) Apply 1 application topically at bedtime. Applies under nose, throat and on chest.    . cetirizine (ZYRTEC) 10 MG tablet Take 10 mg by mouth daily as needed for allergies (allergies).     . chlorpheniramine-HYDROcodone (TUSSIONEX PENNKINETIC ER) 10-8 MG/5ML SUER Take 5 mLs by mouth every 12 (twelve) hours as needed for cough. 115 mL 0  . clindamycin (CLINDAGEL) 1 % gel Apply topically to affected areas BID. 60 g 3  . fluticasone (FLONASE) 50 MCG/ACT nasal spray   1  . hydrocortisone 2.5 % cream APPLY TOPICALLY TO THE AFFECTED AREA TWICE DAILY 454 g 0  . lidocaine-prilocaine (EMLA) cream Apply topically as needed. 30 g 2  . loperamide (IMODIUM A-D) 2 MG tablet Take 1 tablet (2 mg total) by mouth 4 (four) times daily as needed for diarrhea or loose stools. 30 tablet 0  . LORazepam (ATIVAN) 1 MG tablet Take 1 tablet (1 mg total) by mouth once. 2 tablet 0  . ondansetron (ZOFRAN) 8 MG tablet Take 1  tablet (8 mg total) by mouth every 8 (eight) hours as needed for nausea or vomiting. 30 tablet 3  . oxyCODONE-acetaminophen (ROXICET) 5-325 MG per tablet Take 1-2 tablets by mouth every 4 (four) hours as needed for severe pain. 30 tablet 0  . pantoprazole (PROTONIX) 20 MG tablet Take 1 tablet (20 mg total) by mouth daily. 30 tablet 3  . predniSONE (STERAPRED UNI-PAK 21 TAB) 10 MG (21) TBPK tablet Take 1 tablet (10 mg total) by mouth daily. 3 tab daily for 3 days then 2 tab daily for 3 days then 1 tab daily for 3 days then 2/1 tab daily until finish 21 tablet 0  . Pseudoeph-Doxylamine-DM-APAP (NYQUIL PO) Take 30 mLs by mouth 2 (two) times daily as needed (cold/allergies).     Marland Kitchen albuterol (PROVENTIL HFA;VENTOLIN HFA) 108 (90 Base) MCG/ACT inhaler Inhale 2 puffs into the lungs every 6 (six) hours as needed for wheezing or shortness of breath. 1 Inhaler 2  . promethazine (  PHENERGAN) 6.25 MG/5ML syrup Take 6.25 mg by mouth every 6 (six) hours as needed for nausea or vomiting. Reported on 09/13/2015     No current facility-administered medications for this visit.   Facility-Administered Medications Ordered in Other Visits  Medication Dose Route Frequency Provider Last Rate Last Dose  . sodium chloride flush (NS) 0.9 % injection 10 mL  10 mL Intravenous PRN [name], MD   10 mL at 09/13/15 0825    REVIEW OF SYSTEMS:   Constitutional: Denies fevers, chills or abnormal night sweats, (+) malaise  Eyes: Denies blurriness of vision, double vision or watery eyes Ears, nose, mouth, throat, and face: Denies mucositis or sore throat Respiratory: (+) productive cough, no dyspnea or wheezes Cardiovascular: Denies palpitation, chest discomfort or lower extremity swelling Gastrointestinal:  Denies nausea, heartburn or change in bowel habits Skin:(+)  skin pigmentation on bilateral forearms and left elbow Lymphatics: Denies new lymphadenopathy or easy bruising Neurological:Denies numbness, tingling or new  weaknesses Behavioral/Psych: Mood is stable, no new changes  All other systems were reviewed with the patient and are negative.  PHYSICAL EXAMINATION: ECOG PERFORMANCE STATUS: 1 BP 126/88 mmHg  Pulse 72  Temp(Src) 98.5 F (36.9 C) (Oral)  Resp 16  Ht _0  (1.778 m)  Wt 211 lb 6.4 oz (95.89 kg)  BMI 30.33 kg/m2  SpO2 100% GENERAL:alert, no distress and comfortable SKIN: skin color, texture, turgor are normal, no rashes or significant lesions EYES: normal, conjunctiva are pink and non-injected, sclera clear OROPHARYNX:no exudate, no erythema and lips, buccal mucosa, and tongue normal  NECK: supple, thyroid normal size, non-tender, without nodularity LYMPH:  no palpable lymphadenopathy in the cervical, axillary or inguinal LUNGS: clear to auscultation and percussion with normal breathing effort HEART: regular rate & rhythm and no murmurs and no lower extremity edema ABDOMEN:abdomen soft, non-tender and normal bowel sounds Musculoskeletal:no cyanosis of digits and no clubbing  PSYCH: alert & oriented x 3 with fluent speech NEURO: no focal motor/sensory deficits Breasts:  Status post right breast lumpectomy and sentinel lymph node biopsy, incisions have healed well. There is a 5cm lump above her breast incision site, non-tender. (+) Moderate skin pigmentation of right breast secondary to radiation, previous skin ulcers have healed. lcers under her right breast. Palpation of left breast  and axilla revealed no other obvious mass that I could appreciate. Skin: (+) Skin pigmentation on hands and feet, lighter than before. No skin rashes. (+)  pigmentation on fingernails, and bulging of fingernails and thickening of soft tissue under nailbed.  LABORATORY DATA:  I have reviewed the data as listed CBC Latest Ref Rng 09/13/2015 08/22/2015 08/13/2015  WBC 3.9 - 10.3 10e3/uL 6.8 5.7 6.0  Hemoglobin 11.6 - 15.9 g/dL 12.3 12.9 11.4(L)  Hematocrit 34.8 - 46.6 % 38.1 39.4 35.8(L)  Platelets 145 -  400 10e3/uL 199 308 285    CMP Latest Ref Rng 08/22/2015 08/13/2015 07/26/2015  Glucose 70 - 140 mg/dl 95 96 85  BUN 7.0 - 26.0 mg/dL 15.1 7 12.5  Creatinine 0.6 - 1.1 mg/dL 0.9 0.76 0.8  Sodium 136 - 145 mEq/L 138 139 139  Potassium 3.5 - 5.1 mEq/L 3.9 3.4(L) 3.8  Chloride 101 - 111 mmol/L - 105 -  CO2 22 - 29 mEq/L _1 Calcium 8.4 - 10.4 mg/dL 9.5 8.7(L) 9.3  Total Protein 6.4 - 8.3 g/dL 8.5(H) 7.6 7.4  Total Bilirubin 0.20 - 1.20 mg/dL 0.61 0.6 0.36  Alkaline Phos 40 - 150 U/L 89 77 79  AST 5 - 34 U/L _0 ALT 0 - 55 U/L _1 PATHOLOGY REPORT 07/09/2014 #1 breast, right needle core biopsy more anterior -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 45)  Lung, biopsy, Left 07/26/2014 - HIGH GRADE CARCINOMA, SEE COMMENT. Microscopic Comment The carcinoma demonstrates the following immunophenotype: TTF-1 - negative expression. Napsin A- negative expression. CK5/6 - focal, moderate expression. estrogen receptor - negative expression. GCDFP- negative expression. The recent breast biopsy demonstrating Her2 amplified high grade invasive mammary carcinoma is noted ([id number]). Although the immunophenotype of the current case is non-sepcific, it strongly argues against primary lung adenocarcinoma. However, on re-review, the morphology of the current case is essentially identical to the primary mammary carcinoma. In lieu of further immunophenotyping, the tumor will be submitted for Her2 testing and the remaining tissue will be reserved for additional ancillary tumor testing. The results of the Her2 testing will be reported in an addendum. The case was discussed with Dr Burr Medico on 07/28/2015 and 07/29/2015 HER2 POSITIVE   Diagnosis 03/29/2015 1.  Breast, lumpectomy, Right - INVASIVE DUCTAL CARCINOMA, SEE COMMENT. - SEE TUMOR SYNOPTIC TEMPLATE BELOW. 2. Lymph node, sentinel, biopsy, Right axillary #1 - ONE LYMPH NODE, NEGATIVE FOR TUMOR (0/1). - SEE COMMENT. 3. Lymph node, sentinel, biopsy, Right axillary #2 - BENIGN FIBROFATTY SOFT TISSUE. - NEGATIVE FOR ATYPIA OR MALIGNANCY. - NEGATIVE FOR LYMPH NODE. 4. Lymph node, sentinel, biopsy, Right axillary #3 - ONE LYMPH NODE, NEGATIVE FOR TUMOR (0/1). - SEE COMMENT. 5. Lymph node, sentinel, biopsy, Right axillary #4 - ONE LYMPH NODE, NEGATIVE FOR TUMOR (0/1). - SEE COMMENT. 6. Lymph node, sentinel, biopsy, Right axillary #5 - ONE LYMPH NODE, NEGATIVE FOR TUMOR (0/1). - SEE COMMENT. 7. Lymph node, sentinel, biopsy, right axillary - ONE LYMPH NODE, NEGATIVE FOR TUMOR (0/1). - SEE COMMENT. 8. Lymph node, sentinel, biopsy, right axillary - ONE LYMPH NODE, NEGATIVE FOR TUMOR (0/1). - SEE COMMENT. 1 of 4 Amended copy Amended FINAL for CHARMIN, AGUINIGA ([id number]) Microscopic Comment 1. BREAST, INVASIVE TUMOR, WITH LYMPH NODES PRESENT Specimen, including laterality and lymph node sampling (sentinel, non-sentinel): Right breast with sentinel and non-sentinel lymph node sampling Procedure: Lumpectomy Histologic type: Ductal, see comment Grade: 2 of 3, see comment. Tubule formation: 3 Nuclear pleomorphism: 3 Mitotic: 1 Tumor size (glass slide measurement): 1 mm, see comment Margins: Invasive, distance to closest margin: See comment In-situ, distance to closest margin: N/A If margin positive, focally or broadly: N/A Lymphovascular invasion: Absent Ductal carcinoma in situ: Absent Grade: N/A Extensive intraductal component: N/A Lobular neoplasia: Absent Tumor focality: Unifocal, see comment Treatment effect: Present If present, treatment effect in breast tissue, lymph nodes or both: Both breast tissue and lymph nodes Extent of tumor: Skin: N/A Nipple: N/A Skeletal  muscle: N/A Lymph nodes: Examined: 5 Sentinel 2 Non-sentinel 7 Total Lymph nodes with metastasis: 0 Isolated tumor cells (< 0.2 mm): N/A Micrometastasis: (> 0.2 mm and < 2.0 mm): N/A Macrometastasis: (> 2.0 mm): N/A Extracapsular extension: N/A Breast prognostic profile: Estrogen receptor: Not repeated, previous study demonstrated 0% positivity ([id number]). Progesterone receptor: Not repeated, previous study demonstrated 0% positivity ([id number]) Her 2 neu: Demonstrates amplificaition ([id number]) Ki-67: Not repeated, previous study demonstrate 79% proliferation rate ([id number]). Non-neoplastic breast: Neoadjuvant related tissue changes and adenosis. TNM: ypT69m, ypN0, pMX Comments:  Numerous representative sections including the 2.0 cm hemorrhagic tissue associated with X-shaped biopsy clip, the 2.0 cm ill defined lesion associated with M-shaped clip, and intervening tissue were submitted for review. Slide sections from the tissue surrounding the X-shaped and M-shaped clips demonstrate tissue changes consistent with neoadjuvant related change. There is no invasive or in situ carcinom present at either site. However, in a representative section from the intervening tissue (slide G), there is a 1 mm focus of high grade invasive ductal carcinoma present. Given the minute size of the tumor, tumor grading is limited. The tumor is present within non-marginal tissue sections and is considered to be at least 0.5 cm from the nearest margin (medial).   Note: Amendment issued for modification of synoptic table, inadvertent typographical error. The change in the synoptic table was discussed with Dr Burr Medico on 04/07/2015. (CRR:ecj 04/07/2015) 2. , 4-8. In parts 2 and 4, there is extensive neoadjuvant related tissue changes including abundant foamy macrophages, fibroinflammatory reaction and dystrophic calcifications. The neoadjuvant tissue changes extend into perinodal soft tissue and are either  focally or broadly present at the cauterized edge of the tissue submitted. There are no definitive features of malignancy present.  RADIOGRAPHIC STUDIES:  CT chest, abdomen and pelvis with contrast 08/25/2015 IMPRESSION: 1. Today's study demonstrates a positive response to therapy with decreased size of the previously noted left upper lobe nodule at site of treated metastasis, and resolution of the previously noted 2 right-sided pulmonary nodules, with evolving postradiation changes in the right lung, as detailed above. 2. Postoperative changes of lumpectomy in the medial aspect of the right breast, similar to prior examination with a small chronic postoperative seroma. No findings to suggest local recurrence of disease. 3. No evidence of metastatic disease to the abdomen or pelvis on today's examination. 4. Mild colonic diverticulosis without evidence of acute diverticulitis at this time. 5. Normal appendix. 6. Additional incidental findings, as above.  ECHO 06/09/2015 Study Conclusion - Left ventricle: The cavity size was normal. Global longitudinal strain -21.8%. Systolic function was vigorous. The estimated ejection fraction was in the range of 65% to 70%. Wall motion was normal; there were no regional wall motion abnormalities. Left ventricular diastolic function parameters were normal. - Aortic valve: Transvalvular velocity was within the normal range. There was no stenosis. There was no regurgitation. - Mitral valve: There was mild regurgitation. - Right ventricle: The cavity size was normal. Wall thickness was normal. Systolic function was normal. - Tricuspid valve: There was no regurgitation. - Pulmonic valve: There was trivial regurgitation.   ASSESSMENT & PLAN:  45 year old African-American female, premenopausal, without significant past medical history, presented with palpable right breast mass and right axillary mass.   1. R breast invasive ductal  carcinoma, T3N1M1, stage IV, ER-/PR-/HER2+, with metastases to b/l lungs, biopsy confirmed -We discussed that her disease is likely incurable at this stage, and treatment goal is palliation, prolong her life and preserve the quality of life. - I reviewed her breasts surgical pathology results with her in great detail ,  She has had fantastic partial response (near complete response) form 8 months of chemotherapy and antibody therapy.   The initial 6.3 cm breast mass has only 1 mm residual tumor,  And the previously 4.4 cm right axillary lymph node has had complete  Pathological response, all 7 nodes were negative for cancer. -I reviewed her restaging CT scans from 08/25/2015, the previous right upper lobe nodule has resolved, she has multiple infiltrates in the right upper/middle lobes, likely  related to her prior breast radiation. No other evidence of disease. I reviewed the CT scan with her radiation oncologist Dr. Pablo Ledger last week, she concurs that the change in the right lung is likely related to radiation.  -continue herceptin for now, lab reviewed, CBC normal, CMP still pending.  -Continue monitoring cardiac echo every 3-4 months  2. Productive cough, bronchitis vs radiation pneumonitis -Her chest x-ray reviewed some infiltrates in the right middle lobe on 08/13/2015. -She received 2 courses of antibiotics -She responded to tapering dose of prednisone well, she'll finish in 2 days. -I cording albuterol inhaler which she used to before, she will use as needed  3. Right breast mass at incision site -This is a seroma from her breast surgery, confirmed by CT   4. G1 peripheral neuropathy, skin pigmentation, hands and feet -improving. We'll continue to monitor closely.   Plan for today  -Continue Herceptin every 3 weeks, treatment today. -She will finish prednisone in 2 days, I called in albuterol inhaler for her to use as needed -OK to return to work from today, I gave her a letter -I'll  see her back in 3 weeks with a repeated echo     I spent about 20 minutes counseling the patient and her family members, total care was about 25 minutes.  [name]  09/13/2015

## 2015-09-14 LAB — CANCER ANTIGEN 27.29: CAN 27.29: 20.9 U/mL (ref 0.0–38.6)

## 2015-09-19 ENCOUNTER — Telehealth: Payer: Self-pay | Admitting: Hematology

## 2015-09-19 NOTE — Telephone Encounter (Signed)
Left message for patient appointment dates/time for ov/lab/echo. Sent message to linda for precert for echo.

## 2015-09-22 ENCOUNTER — Ambulatory Visit (HOSPITAL_COMMUNITY)
Admission: RE | Admit: 2015-09-22 | Discharge: 2015-09-22 | Disposition: A | Discharge status: Home / Self Care | Payer: 59 | Source: Ambulatory Visit | Attending: Hematology | Admitting: Hematology

## 2015-09-22 DIAGNOSIS — I071 Rheumatic tricuspid insufficiency: Secondary | ICD-10-CM | POA: Diagnosis not present

## 2015-09-22 DIAGNOSIS — C50911 Malignant neoplasm of unspecified site of right female breast: Secondary | ICD-10-CM | POA: Insufficient documentation

## 2015-09-22 DIAGNOSIS — C7800 Secondary malignant neoplasm of unspecified lung: Secondary | ICD-10-CM | POA: Diagnosis not present

## 2015-09-22 NOTE — Progress Notes (Signed)
  Echocardiogram 2D Echocardiogram has been performed.  Joelene Millin 09/22/2015, 11:49 AM

## 2015-09-24 ENCOUNTER — Other Ambulatory Visit: Payer: Self-pay | Admitting: Hematology

## 2015-09-26 ENCOUNTER — Telehealth (HOSPITAL_COMMUNITY): Payer: Self-pay | Admitting: Vascular Surgery

## 2015-09-26 ENCOUNTER — Other Ambulatory Visit: Payer: Self-pay | Admitting: Hematology

## 2015-09-26 DIAGNOSIS — C7800 Secondary malignant neoplasm of unspecified lung: Principal | ICD-10-CM

## 2015-09-26 DIAGNOSIS — C50911 Malignant neoplasm of unspecified site of right female breast: Secondary | ICD-10-CM

## 2015-09-26 NOTE — Telephone Encounter (Signed)
Left pt message to make New brst cancer appt w/ DB ON 09/29/15 @ 3:20

## 2015-09-27 ENCOUNTER — Telehealth: Payer: Self-pay | Admitting: Hematology

## 2015-09-27 NOTE — Telephone Encounter (Signed)
per [facility] @ Dr Sung Amabile office pt has been contacted for appt

## 2015-09-28 ENCOUNTER — Telehealth (HOSPITAL_COMMUNITY): Payer: Self-pay | Admitting: Vascular Surgery

## 2015-09-28 NOTE — Telephone Encounter (Signed)
appt sch 2/16

## 2015-09-28 NOTE — Telephone Encounter (Signed)
Left pt message to make New brst cancer appt

## 2015-09-29 ENCOUNTER — Ambulatory Visit (HOSPITAL_COMMUNITY)
Admission: RE | Admit: 2015-09-29 | Discharge: 2015-09-29 | Disposition: A | Discharge status: Home / Self Care | Payer: 59 | Source: Ambulatory Visit | Attending: Internal Medicine | Admitting: Internal Medicine

## 2015-09-29 ENCOUNTER — Encounter (HOSPITAL_COMMUNITY): Payer: Self-pay | Admitting: Internal Medicine

## 2015-09-29 VITALS — BP 128/84 | HR 84 | Wt 225.5 lb

## 2015-09-29 DIAGNOSIS — Z885 Allergy status to narcotic agent status: Secondary | ICD-10-CM | POA: Diagnosis not present

## 2015-09-29 DIAGNOSIS — Z171 Estrogen receptor negative status [ER-]: Secondary | ICD-10-CM | POA: Insufficient documentation

## 2015-09-29 DIAGNOSIS — C7801 Secondary malignant neoplasm of right lung: Secondary | ICD-10-CM | POA: Diagnosis not present

## 2015-09-29 DIAGNOSIS — C7802 Secondary malignant neoplasm of left lung: Secondary | ICD-10-CM | POA: Insufficient documentation

## 2015-09-29 DIAGNOSIS — Z923 Personal history of irradiation: Secondary | ICD-10-CM | POA: Insufficient documentation

## 2015-09-29 DIAGNOSIS — Z79899 Other long term (current) drug therapy: Secondary | ICD-10-CM | POA: Diagnosis not present

## 2015-09-29 DIAGNOSIS — C50911 Malignant neoplasm of unspecified site of right female breast: Secondary | ICD-10-CM | POA: Diagnosis present

## 2015-09-29 DIAGNOSIS — Z9221 Personal history of antineoplastic chemotherapy: Secondary | ICD-10-CM | POA: Diagnosis not present

## 2015-09-29 DIAGNOSIS — Z800 Family history of malignant neoplasm of digestive organs: Secondary | ICD-10-CM | POA: Insufficient documentation

## 2015-09-29 DIAGNOSIS — Z886 Allergy status to analgesic agent status: Secondary | ICD-10-CM | POA: Insufficient documentation

## 2015-09-29 DIAGNOSIS — Z803 Family history of malignant neoplasm of breast: Secondary | ICD-10-CM | POA: Diagnosis not present

## 2015-09-29 DIAGNOSIS — C7800 Secondary malignant neoplasm of unspecified lung: Secondary | ICD-10-CM | POA: Diagnosis not present

## 2015-09-29 NOTE — Patient Instructions (Signed)
We will contact you in 4 months to schedule your next appointment.  

## 2015-09-29 NOTE — Progress Notes (Signed)
Patient ID: [name] [name], female   DOB: 04/05/1971, 45 y.o.   MRN: [id number]   [place] NOTE  Patient Care Team: Pcp Not In System as PCP - General [name], DO (Family Medicine) Holley Bouche, NP as Nurse Practitioner (Nurse Practitioner) Stark Klein, MD as Consulting Physician (General Surgery) Arloa Koh, MD as Consulting Physician (Radiation Oncology) [name], MD as Consulting Physician (Hematology)  HPI:   Ms. Lasalle is a 45 y/o woman with Stage IV breast CA referred by Dr. Burr Medico for enrollment into the cardio-oncology clinic for surveillance while receiving Hercetin (starter 04/12/15 and on indefinitiely)  Oncology History   Breast cancer 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   Staging form: Breast, AJCC 7th Edition     Clinical stage from 07/22/2014: Stage IV (T3, N1, M1) - Unsigned       Breast cancer 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 ([place])   07/02/2014 Mammogram Mammogram showed a 2cm right beast mass and a 1.8cm right axillary node. 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 Bilateral pulmonary lesions with left hilar lymphadenopathy also markedly decreased in the interval. The left hilar lymphadenopathy has resolved.   12/20/2014 Imaging restaging CT showed stable disease, no new  lesions    03/29/2015 Pathology Results  right breast lumpectomy showed  chemotherapy treatment effect,  a 1 mm residual tumor,   margins were widely negative, 5 sentinel lymph nodes and 2 axillary lymph nodes were negative.    03/29/2015 Surgery  right breast lumpectomy and sentinel lymph node biopsy  by Dr. BBarry Dienes  04/12/2015 -  Chemotherapy  Herceptin maintenance therapy , 6 mg/kg, every 3 weeks   05/03/2015 - 06/14/2015 Radiation Therapy right breast adjuvant irradiation by Dr. MValere Dross    Denies h/o known cardiac disease. Has been receiving Herceptin since 8/16 without problem. No CP, SOB, edema, orthopnea or PND. Recently treated for URI but remains with severe nasal congestion. Echos reviewed personally today in clinic  Echo 3/16 55-50% Lat s' 12.0 cm/sec GLS -17.5% Echo 2/17 55-60%  Lat s' 11.2 cm/sec GLS -17.2%   MEDICAL HISTORY:  Past Medical History  Diagnosis Date  . Pneumonia     hx of pneumonia 08/2013   . GERD (gastroesophageal reflux disease)     during pregnancy   . S/P radiation therapy 05/03/2015 through 06/14/2015     Right breast 4680 cGy in 26 sessions, right breast boost 1000 cGy in 5 sessions. Right supraclavicular/axillary region 4680 cGy with a supplemental PA field to bring the axillary dose up to 4500 cGy in 26 sessions  . Breast cancer [place]   . Breast cancer ([facility]     SURGICAL HISTORY: Past Surgical History  Procedure Laterality Date  . Cesarean section    . Essure tubal ligation    . Cholecystectomy    .  Wisdom tooth extraction    . Portacath placement Left 07/21/2014    Procedure: INSERTION PORT-A-CATH;  Surgeon: Stark Klein, MD;  Location: [facility];  Service: General;  Laterality: Left;  . Breast lumpectomy with radioactive seed and sentinel lymph node biopsy Right 03/29/2015    Procedure: BREAST LUMPECTOMY WITH RADIOACTIVE SEED AND SENTINEL LYMPH NODE BIOPSY;  Surgeon: Stark Klein, MD;  Location: [facility];  Service: General;  Laterality: Right;    SOCIAL HISTORY: History   Social History  . Marital Status: Married    Spouse Name: N/A    Number of Children:  she has 2 daughters at the age of 59 and 31.   . Years of Education: N/A   Occupational History  .  works as Freight forwarder for a [employer].    Social History Main Topics  . Smoking status: Never Smoker   . Smokeless tobacco: Not on file  . Alcohol Use: Yes  . Drug Use: No  . Sexual Activity: No    FAMILY HISTORY: Family History  Problem Relation Age of Onset  . Breast cancer Mother 34  . Liver cancer Father 19  . Breast cancer Maternal Aunt 36  . Prostate cancer Maternal Grandfather   . Liver cancer Paternal Grandmother   . Prostate cancer Paternal Grandfather      GENETICS: 08/30/2014 BreastNext panel was negative. 17 genes including BRCA1, BRCA2, were negative for mutations.   ALLERGIES:  is allergic to adhesive; aspirin; and codeine.  MEDICATIONS:  Current Outpatient Prescriptions  Medication Sig Dispense Refill  . albuterol (PROVENTIL HFA;VENTOLIN HFA) 108 (90 Base) MCG/ACT inhaler Inhale 2 puffs into the lungs every 6 (six) hours as needed for wheezing or shortness of breath. 1 Inhaler 2  . Camphor-Eucalyptus-Menthol (VICKS VAPORUB EX) Apply 1 application topically at bedtime. Applies under nose, throat and on chest.    . cetirizine (ZYRTEC) 10 MG tablet Take 10 mg by mouth daily as needed for allergies (allergies).     . chlorpheniramine-HYDROcodone (TUSSIONEX PENNKINETIC ER) 10-8 MG/5ML SUER Take 5 mLs by mouth every 12 (twelve) hours as needed for cough. 115 mL 0  . clindamycin (CLINDAGEL) 1 % gel Apply topically to affected areas BID. 60 g 3  . fluticasone (FLONASE) 50 MCG/ACT nasal spray   1  . hydrocortisone 2.5 % cream APPLY TOPICALLY TO THE AFFECTED AREA TWICE DAILY 454 g 0  . lidocaine-prilocaine (EMLA) cream Apply topically as needed. 30 g 2  . loperamide (IMODIUM A-D) 2 MG  tablet Take 1 tablet (2 mg total) by mouth 4 (four) times daily as needed for diarrhea or loose stools. 30 tablet 0  . ondansetron (ZOFRAN) 8 MG tablet Take 1 tablet (8 mg total) by mouth every 8 (eight) hours as needed for nausea or vomiting. 30 tablet 3  . pantoprazole (PROTONIX) 20 MG tablet Take 1 tablet (20 mg total) by mouth daily. 30 tablet 3  . Pseudoeph-Doxylamine-DM-APAP (NYQUIL PO) Take 30 mLs by mouth 2 (two) times daily as needed (cold/allergies).     . LORazepam (ATIVAN) 1 MG tablet Take 1 tablet (1 mg total) by mouth once. (Patient not taking: Reported on 09/29/2015) 2 tablet 0  . promethazine (PHENERGAN) 6.25 MG/5ML syrup Take 6.25 mg by mouth every 6 (six) hours as needed for nausea or vomiting. Reported on 09/29/2015     No current facility-administered medications for this encounter.    REVIEW OF SYSTEMS:   Constitutional: Denies fevers, chills or abnormal night sweats,  Eyes: Denies blurriness of vision, double vision or watery eyes Ears, nose, mouth, throat, and face: Denies mucositis or sore throat Respiratory: (+) productive cough, (+) sinus congestion no dyspnea or wheezes Cardiovascular: Denies palpitation, chest discomfort or lower extremity swelling Gastrointestinal:  Denies nausea, heartburn or change in bowel habits Skin:(+)  skin pigmentation on bilateral forearms and left elbow Lymphatics: Denies new lymphadenopathy or easy bruising Neurological:Denies numbness, tingling or new weaknesses Behavioral/Psych: Mood is stable, no new changes  All other systems were reviewed with the patient and are negative.  PHYSICAL EXAMINATION: General:  Well appearing. No resp difficulty. +nasal congetion HEENT: normal Neck: supple. no JVD. Carotids 2+ bilat; no bruits. No lymphadenopathy or thryomegaly appreciated. + port-a-cath Cor: PMI nondisplaced. Regular rate & rhythm. No rubs, gallops or murmurs. Lungs: clear Abdomen: obese soft, nontender, nondistended. No  hepatosplenomegaly. No bruits or masses. Good bowel sounds. Extremities: no cyanosis, clubbing, rash, edema Neuro: alert & orientedx3, cranial nerves grossly intact. moves all 4 extremities w/o difficulty. Affect pleasant   LABORATORY DATA:  I have reviewed the data as listed CBC Latest Ref Rng 09/13/2015 08/22/2015 08/13/2015  WBC 3.9 - 10.3 10e3/uL 6.8 5.7 6.0  Hemoglobin 11.6 - 15.9 g/dL 12.3 12.9 11.4(L)  Hematocrit 34.8 - 46.6 % 38.1 39.4 35.8(L)  Platelets 145 - 400 10e3/uL 199 308 285    CMP Latest Ref Rng 09/13/2015 08/22/2015 08/13/2015  Glucose 70 - 140 mg/dl 102 95 96  BUN 7.0 - 26.0 mg/dL 22.0 15.1 7  Creatinine 0.6 - 1.1 mg/dL 0.9 0.9 0.76  Sodium 136 - 145 mEq/L 139 138 139  Potassium 3.5 - 5.1 mEq/L 3.8 3.9 3.4(L)  Chloride 101 - 111 mmol/L - - 105  CO2 22 - 29 mEq/L _0 Calcium 8.4 - 10.4 mg/dL 9.5 9.5 8.7(L)  Total Protein 6.4 - 8.3 g/dL 7.9 8.5(H) 7.6  Total Bilirubin 0.20 - 1.20 mg/dL 0.50 0.61 0.6  Alkaline Phos 40 - 150 U/L 80 89 77  AST 5 - 34 U/L _1 ALT 0 - 55 U/L _2 ASSESSMENT & PLAN:   1. R breast invasive ductal carcinoma, T3N1M1, stage IV, ER-/PR-/HER2+, with metastases to b/l lungs, biopsy confirmed Explained incidence of Herceptin cardiotoxicity and role of Cardio-oncology clinic at length. Echo images reviewed personally. All parameters stable. Reviewed signs and symptoms of HF to look for. Continue Herceptin. Follow-up with echo every 4 months. Given that she has tolerated Herceptin well for 18 months suspect risk for cardiotoxicity well below 10% we typically quote patients.   Glori Bickers MD 09/29/2015

## 2015-10-03 ENCOUNTER — Encounter: Payer: Self-pay | Admitting: Hematology

## 2015-10-03 NOTE — Progress Notes (Signed)
Called patient advised due to Dr. Ernestina Penna family emergency, she will be seeing APP([place]). Patient is ok to see APP.

## 2015-10-04 ENCOUNTER — Other Ambulatory Visit (HOSPITAL_BASED_OUTPATIENT_CLINIC_OR_DEPARTMENT_OTHER): Payer: 59

## 2015-10-04 ENCOUNTER — Ambulatory Visit (HOSPITAL_BASED_OUTPATIENT_CLINIC_OR_DEPARTMENT_OTHER): Payer: 59

## 2015-10-04 ENCOUNTER — Ambulatory Visit (HOSPITAL_BASED_OUTPATIENT_CLINIC_OR_DEPARTMENT_OTHER): Payer: 59 | Admitting: Nurse Practitioner

## 2015-10-04 ENCOUNTER — Encounter: Payer: Self-pay | Admitting: *Deleted

## 2015-10-04 ENCOUNTER — Encounter: Payer: Self-pay | Admitting: Nurse Practitioner

## 2015-10-04 VITALS — BP 152/81 | HR 88 | Temp 98.4°F | Resp 20 | Ht 70.0 in | Wt 223.0 lb

## 2015-10-04 DIAGNOSIS — C7800 Secondary malignant neoplasm of unspecified lung: Secondary | ICD-10-CM

## 2015-10-04 DIAGNOSIS — J302 Other seasonal allergic rhinitis: Secondary | ICD-10-CM | POA: Diagnosis not present

## 2015-10-04 DIAGNOSIS — C50911 Malignant neoplasm of unspecified site of right female breast: Secondary | ICD-10-CM

## 2015-10-04 DIAGNOSIS — Z5112 Encounter for antineoplastic immunotherapy: Secondary | ICD-10-CM | POA: Diagnosis not present

## 2015-10-04 LAB — COMPREHENSIVE METABOLIC PANEL
ALBUMIN: 3.4 g/dL — AB (ref 3.5–5.0)
ALK PHOS: 80 U/L (ref 40–150)
ALT: 20 U/L (ref 0–55)
AST: 23 U/L (ref 5–34)
Anion Gap: 8 mEq/L (ref 3–11)
BUN: 11 mg/dL (ref 7.0–26.0)
CALCIUM: 9.4 mg/dL (ref 8.4–10.4)
CO2: 28 mEq/L (ref 22–29)
Chloride: 105 mEq/L (ref 98–109)
Creatinine: 0.8 mg/dL (ref 0.6–1.1)
Glucose: 85 mg/dl (ref 70–140)
POTASSIUM: 3.8 meq/L (ref 3.5–5.1)
Sodium: 141 mEq/L (ref 136–145)
Total Bilirubin: 0.43 mg/dL (ref 0.20–1.20)
Total Protein: 7.6 g/dL (ref 6.4–8.3)

## 2015-10-04 LAB — CBC WITH DIFFERENTIAL/PLATELET
BASO%: 0.4 % (ref 0.0–2.0)
Basophils Absolute: 0 10*3/uL (ref 0.0–0.1)
EOS%: 2.7 % (ref 0.0–7.0)
Eosinophils Absolute: 0.2 10*3/uL (ref 0.0–0.5)
HEMATOCRIT: 35.9 % (ref 34.8–46.6)
HEMOGLOBIN: 12.1 g/dL (ref 11.6–15.9)
LYMPH#: 1.5 10*3/uL (ref 0.9–3.3)
LYMPH%: 24.8 % (ref 14.0–49.7)
MCH: 28.7 pg (ref 25.1–34.0)
MCHC: 33.7 g/dL (ref 31.5–36.0)
MCV: 85.4 fL (ref 79.5–101.0)
MONO#: 0.5 10*3/uL (ref 0.1–0.9)
MONO%: 8.4 % (ref 0.0–14.0)
NEUT#: 3.9 10*3/uL (ref 1.5–6.5)
NEUT%: 63.7 % (ref 38.4–76.8)
PLATELETS: 233 10*3/uL (ref 145–400)
RBC: 4.21 10*6/uL (ref 3.70–5.45)
RDW: 13.7 % (ref 11.2–14.5)
WBC: 6.1 10*3/uL (ref 3.9–10.3)

## 2015-10-04 MED ORDER — SODIUM CHLORIDE 0.9 % IV SOLN
Freq: Once | INTRAVENOUS | Status: AC
  Administered 2015-10-04: 10:00:00 via INTRAVENOUS

## 2015-10-04 MED ORDER — HYDROCOD POLST-CPM POLST ER 10-8 MG/5ML PO SUER: 5.0000 mL | Freq: Two times a day (BID) | ORAL | Status: DC | PRN

## 2015-10-04 MED ORDER — ACETAMINOPHEN 325 MG PO TABS
650.0000 mg | ORAL_TABLET | Freq: Once | ORAL | Status: AC
  Administered 2015-10-04: 650 mg via ORAL

## 2015-10-04 MED ORDER — TRASTUZUMAB CHEMO INJECTION 440 MG
6.0000 mg/kg | Freq: Once | INTRAVENOUS | Status: AC
  Administered 2015-10-04: 567 mg via INTRAVENOUS
  Filled 2015-10-04: qty 27

## 2015-10-04 MED ORDER — SODIUM CHLORIDE 0.9 % IJ SOLN
10.0000 mL | INTRAMUSCULAR | Status: DC | PRN
  Administered 2015-10-04: 10 mL
  Filled 2015-10-04: qty 10

## 2015-10-04 MED ORDER — HEPARIN SOD (PORK) LOCK FLUSH 100 UNIT/ML IV SOLN
500.0000 [IU] | Freq: Once | INTRAVENOUS | Status: AC | PRN
  Administered 2015-10-04: 500 [IU]
  Filled 2015-10-04: qty 5

## 2015-10-04 MED ORDER — ACETAMINOPHEN 325 MG PO TABS
ORAL_TABLET | ORAL | Status: AC
  Filled 2015-10-04: qty 2

## 2015-10-04 NOTE — Patient Instructions (Signed)
[facility] Discharge Instructions for Patients Receiving Chemotherapy  Today you received the following chemotherapy agents: Herceptin   To help prevent nausea and vomiting after your treatment, we encourage you to take your nausea medication as directed.    If you develop nausea and vomiting that is not controlled by your nausea medication, call the clinic.   BELOW ARE SYMPTOMS THAT SHOULD BE REPORTED IMMEDIATELY:  *FEVER GREATER THAN 100.5 F  *CHILLS WITH OR WITHOUT FEVER  NAUSEA AND VOMITING THAT IS NOT CONTROLLED WITH YOUR NAUSEA MEDICATION  *UNUSUAL SHORTNESS OF BREATH  *UNUSUAL BRUISING OR BLEEDING  TENDERNESS IN MOUTH AND THROAT WITH OR WITHOUT PRESENCE OF ULCERS  *URINARY PROBLEMS  *BOWEL PROBLEMS  UNUSUAL RASH Items with * indicate a potential emergency and should be followed up as soon as possible.  Feel free to call the clinic you have any questions or concerns. The clinic phone number is [phone].  Please show the CHEMO ALERT CARD at check-in to the Emergency Department and triage nurse.   

## 2015-10-04 NOTE — Assessment & Plan Note (Signed)
Patient completed her chemotherapy on 03/22/2015.  She underwent a right breast lumpectomy on 03/29/2015.  She completed radiation treatments in November 2016.  She presents to the [facility] today to receive her Herceptin infusion.  Patient states that she's been doing fairly well recently; stating that her cough has greatly improved.  She continues with seasonal allergies.  Patient underwent her last echo on 09/22/2015 which showed a reduced ejection fraction down to 55-60%.  Patient was seen by Dr. Haroldine Laws cardiologist for further evaluation and management on 09/29/2015.  Cardiologist reviewed the most recent echo; and cleared patient for further Herceptin infusions.  Patient's next echo will be due around 01/20/2016 per cardiologist advice.  Patient will need to be scheduled for labs, flush, visit, and her next Herceptin infusion in the late afternoon on 10/25/2015.  Also, patient is requesting that all future Herceptin infusions be in the late afternoon if at all possible due to work constraints.

## 2015-10-04 NOTE — Progress Notes (Signed)
SYMPTOM MANAGEMENT CLINIC   HPI: [name] [name] 45 y.o. female diagnosed with breast cancer with lung metastasis.  Patient is status post lumpectomy, chemotherapy, and radiation treatments.  Currently undergoing Herceptin infusions.   Patient completed her chemotherapy on 03/22/2015.  She underwent a right breast lumpectomy on 03/29/2015.  She completed radiation treatments in November 2016.  She presents to the [facility] today to receive her Herceptin infusion.  Patient states that she's been doing fairly well recently; stating that her cough has greatly improved.  She continues with seasonal allergies.  Patient underwent her last echo on 09/22/2015 which showed a reduced ejection fraction down to 55-60%.  Patient was seen by Dr. Haroldine Laws cardiologist for further evaluation and management on 09/29/2015.  Cardiologist reviewed the most recent echo; and cleared patient for further Herceptin infusions.  Patient's next echo will be due around 01/20/2016 per cardiologist advice.  Patient will need to be scheduled for labs, flush, visit, and her next Herceptin infusion in the late afternoon on 10/25/2015.  Also, patient is requesting that all future Herceptin infusions be in the late afternoon if at all possible due to work constraints.  HPI  Review of Systems  Constitutional: Negative for fever and chills.  HENT: Positive for congestion. Negative for sore throat.   Respiratory: Positive for cough. Negative for hemoptysis, sputum production, shortness of breath and wheezing.   All other systems reviewed and are negative.   Past Medical History  Diagnosis Date  . Pneumonia     hx of pneumonia 08/2013   . GERD (gastroesophageal reflux disease)     during pregnancy   . S/P radiation therapy 05/03/2015 through 06/14/2015     Right breast 4680 cGy in 26 sessions, right breast boost 1000 cGy in 5 sessions. Right  supraclavicular/axillary region 4680 cGy with a supplemental PA field to bring the axillary dose up to 4500 cGy in 26 sessions  . Breast cancer ([place])   . Breast cancer [facility])     Past Surgical History  Procedure Laterality Date  . Cesarean section    . Essure tubal ligation    . Cholecystectomy    . Wisdom tooth extraction    . Portacath placement Left 07/21/2014    Procedure: INSERTION PORT-A-CATH;  Surgeon: Stark Klein, MD;  Location: [facility];  Service: General;  Laterality: Left;  . Breast lumpectomy with radioactive seed and sentinel lymph node biopsy Right 03/29/2015    Procedure: BREAST LUMPECTOMY WITH RADIOACTIVE SEED AND SENTINEL LYMPH NODE BIOPSY;  Surgeon: Stark Klein, MD;  Location: [place];  Service: General;  Laterality: Right;    has Breast cancer 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 [facility]); Family history of breast cancer; Lung mas bilaterial; Anemia in neoplastic disease; Peripheral neuropathy due to chemotherapy ([place]); and Seasonal allergies on her problem list.    is allergic to adhesive; aspirin; and codeine.    Medication List       This list is accurate as of: 10/04/15 11:07 AM.  Always use your most recent med list.               albuterol 108 (90 Base) MCG/ACT inhaler  Commonly known as:  PROVENTIL HFA;VENTOLIN HFA  Inhale 2 puffs into the lungs every 6 (six) hours as needed for wheezing or shortness of breath.     cetirizine 10 MG tablet  Commonly known as:  ZYRTEC  Take 10 mg by mouth daily as needed for allergies (allergies).     chlorpheniramine-HYDROcodone 10-8  MG/5ML Suer  Commonly known as:  Tree surgeon ER  Take 5 mLs by mouth every 12 (twelve) hours as needed for cough.     clindamycin 1 % gel  Commonly known as:  CLINDAGEL  Apply topically to affected areas BID.     fluticasone 50 MCG/ACT nasal spray  Commonly known as:  FLONASE     hydrocortisone 2.5 % cream  APPLY TOPICALLY TO THE AFFECTED AREA TWICE DAILY      lidocaine-prilocaine cream  Commonly known as:  EMLA  Apply topically as needed.     loperamide 2 MG tablet  Commonly known as:  IMODIUM A-D  Take 1 tablet (2 mg total) by mouth 4 (four) times daily as needed for diarrhea or loose stools.     LORazepam 1 MG tablet  Commonly known as:  ATIVAN  Take 1 tablet (1 mg total) by mouth once.     NYQUIL PO  Take 30 mLs by mouth 2 (two) times daily as needed (cold/allergies).     ondansetron 8 MG tablet  Commonly known as:  ZOFRAN  Take 1 tablet (8 mg total) by mouth every 8 (eight) hours as needed for nausea or vomiting.     pantoprazole 20 MG tablet  Commonly known as:  PROTONIX  Take 1 tablet (20 mg total) by mouth daily.     promethazine 6.25 MG/5ML syrup  Commonly known as:  PHENERGAN  Take 6.25 mg by mouth every 6 (six) hours as needed for nausea or vomiting. Reported on 10/04/2015     VICKS VAPORUB EX  Apply 1 application topically at bedtime. Applies under nose, throat and on chest.         PHYSICAL EXAMINATION  Oncology Vitals 10/04/2015 09/29/2015  Height 178 cm -  Weight 101.152 kg 102.286 kg  Weight (lbs) 223 lbs 225 lbs 8 oz  BMI (kg/m2) 32 kg/m2 -  Temp 98.4 -  Pulse 88 84  Resp 20 -  SpO2 99 98  BSA (m2) 2.24 m2 -   BP Readings from Last 2 Encounters:  10/04/15 152/81  09/29/15 128/84    Physical Exam  Constitutional: She is oriented to person, place, and time and well-developed, well-nourished, and in no distress.  HENT:  Head: Normocephalic and atraumatic.  Mild nasal congestion.  Eyes: Conjunctivae and EOM are normal. Pupils are equal, round, and reactive to light. Right eye exhibits no discharge. Left eye exhibits no discharge. No scleral icterus.  Neck: Normal range of motion. Neck supple. No JVD present. No tracheal deviation present. No thyromegaly present.  Cardiovascular: Normal rate, regular rhythm, normal heart sounds and intact distal pulses.   Pulmonary/Chest: Effort normal and breath sounds  normal. No respiratory distress. She has no wheezes. She has no rales. She exhibits no tenderness.  Abdominal: Soft. Bowel sounds are normal. She exhibits no distension and no mass. There is no tenderness. There is no rebound and no guarding.  Musculoskeletal: Normal range of motion. She exhibits no edema or tenderness.  Lymphadenopathy:    She has no cervical adenopathy.  Neurological: She is alert and oriented to person, place, and time. Gait normal.  Skin: Skin is warm and dry. No rash noted. No erythema. No pallor.  Psychiatric: Affect normal.    LABORATORY DATA:. Appointment on 10/04/2015  Component Date Value Ref Range Status  . WBC 10/04/2015 6.1  3.9 - 10.3 10e3/uL Final  . NEUT# 10/04/2015 3.9  1.5 - 6.5 10e3/uL Final  . HGB 10/04/2015 12.1  11.6 - 15.9 g/dL Final  . HCT 10/04/2015 35.9  34.8 - 46.6 % Final  . Platelets 10/04/2015 233  145 - 400 10e3/uL Final  . MCV 10/04/2015 85.4  79.5 - 101.0 fL Final  . MCH 10/04/2015 28.7  25.1 - 34.0 pg Final  . MCHC 10/04/2015 33.7  31.5 - 36.0 g/dL Final  . RBC 10/04/2015 4.21  3.70 - 5.45 10e6/uL Final  . RDW 10/04/2015 13.7  11.2 - 14.5 % Final  . lymph# 10/04/2015 1.5  0.9 - 3.3 10e3/uL Final  . MONO# 10/04/2015 0.5  0.1 - 0.9 10e3/uL Final  . Eosinophils Absolute 10/04/2015 0.2  0.0 - 0.5 10e3/uL Final  . Basophils Absolute 10/04/2015 0.0  0.0 - 0.1 10e3/uL Final  . NEUT% 10/04/2015 63.7  38.4 - 76.8 % Final  . LYMPH% 10/04/2015 24.8  14.0 - 49.7 % Final  . MONO% 10/04/2015 8.4  0.0 - 14.0 % Final  . EOS% 10/04/2015 2.7  0.0 - 7.0 % Final  . BASO% 10/04/2015 0.4  0.0 - 2.0 % Final  . Sodium 10/04/2015 141  136 - 145 mEq/L Final  . Potassium 10/04/2015 3.8  3.5 - 5.1 mEq/L Final  . Chloride 10/04/2015 105  98 - 109 mEq/L Final  . CO2 10/04/2015 28  22 - 29 mEq/L Final  . Glucose 10/04/2015 85  70 - 140 mg/dl Final   Glucose reference range is for nonfasting patients. Fasting glucose reference range is 70- 100.  Marland Kitchen BUN  10/04/2015 11.0  7.0 - 26.0 mg/dL Final  . Creatinine 10/04/2015 0.8  0.6 - 1.1 mg/dL Final  . Total Bilirubin 10/04/2015 0.43  0.20 - 1.20 mg/dL Final  . Alkaline Phosphatase 10/04/2015 80  40 - 150 U/L Final  . AST 10/04/2015 23  5 - 34 U/L Final  . ALT 10/04/2015 20  0 - 55 U/L Final  . Total Protein 10/04/2015 7.6  6.4 - 8.3 g/dL Final  . Albumin 10/04/2015 3.4* 3.5 - 5.0 g/dL Final  . Calcium 10/04/2015 9.4  8.4 - 10.4 mg/dL Final  . Anion Gap 10/04/2015 8  3 - 11 mEq/L Final  . EGFR 10/04/2015 >90  >90 ml/min/1.73 m2 Final   eGFR is calculated using the CKD-EPI Creatinine Equation (2009)     RADIOGRAPHIC STUDIES: No results found.  ASSESSMENT/PLAN:    Seasonal allergies Patient states that she suffers with chronic seasonal allergies.  She has also been experiencing some chronic cough as well.  Recent chartand notes indicate that there was a possibility that patient's chronic cough was secondary to possible radiation pneumonitis.  Patient has completed a three-week cycle of prednisone; which patient states helped with her cough.  Patient states that she continues to take Zyrtec and Flonase on a daily basis.  She also intermittently takes Benadryl.  She is requesting a refill of the Tussionex cough syrup so she can rest better at night without coughing.  Patient denies any recent fevers or chills.  Exam today reveals clear lung sounds bilaterally; with no cough or wheeze.  Patient does appear slightly congested and appears to have some sinus drainage.  Vital signs were stable today; and patient was afebrile.  Patient was advised to continue with her allergy medications as directed.    Breast cancer 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 Patient completed her chemotherapy on 03/22/2015.  She underwent a right breast lumpectomy on 03/29/2015.  She completed radiation treatments in November 2016.  She presents to the [facility] today to receive her  Herceptin infusion.  Patient states that she's  been doing fairly well recently; stating that her cough has greatly improved.  She continues with seasonal allergies.  Patient underwent her last echo on 09/22/2015 which showed a reduced ejection fraction down to 55-60%.  Patient was seen by Dr. Haroldine Laws cardiologist for further evaluation and management on 09/29/2015.  Cardiologist reviewed the most recent echo; and cleared patient for further Herceptin infusions.  Patient's next echo will be due around 01/20/2016 per cardiologist advice.  Patient will need to be scheduled for labs, flush, visit, and her next Herceptin infusion in the late afternoon on 10/25/2015.  Also, patient is requesting that all future Herceptin infusions be in the late afternoon if at all possible due to work constraints.  Patient stated understanding of all instructions; and was in agreement with this plan of care. The patient knows to call the clinic with any problems, questions or concerns.   Review/collaboration with Dr. Burr Medico regarding all aspects of patient's visit today.   Total time spent with patient was 25 minutes;  with greater than 75 percent of that time spent in face to face counseling regarding patient's symptoms,  and coordination of care and follow up.  Disclaimer:This dictation was prepared with Dragon/digital dictation along with [employer]. Any transcriptional errors that result from this process are unintentional.  Drue Second, NP 10/04/2015

## 2015-10-04 NOTE — Assessment & Plan Note (Addendum)
Patient states that she suffers with chronic seasonal allergies.  She has also been experiencing some chronic cough as well.  Recent chartand notes indicate that there was a possibility that patient's chronic cough was secondary to possible radiation pneumonitis.  Patient has completed a three-week cycle of prednisone; which patient states helped with her cough.  Patient states that she continues to take Zyrtec and Flonase on a daily basis.  She also intermittently takes Benadryl.  She is requesting a refill of the Tussionex cough syrup so she can rest better at night without coughing.  Patient denies any recent fevers or chills.  Exam today reveals clear lung sounds bilaterally; with no cough or wheeze.  Patient does appear slightly congested and appears to have some sinus drainage.  Vital signs were stable today; and patient was afebrile.  Patient was advised to continue with her allergy medications as directed.

## 2015-10-06 ENCOUNTER — Telehealth: Payer: Self-pay | Admitting: Hematology

## 2015-10-06 NOTE — Telephone Encounter (Signed)
Spoke with pt to confirm appt date/time for 3/14. Pt will get and updated schedule on next visit 3/14

## 2015-10-24 ENCOUNTER — Telehealth: Payer: Self-pay | Admitting: Hematology

## 2015-10-24 NOTE — Telephone Encounter (Signed)
Due to call change time of 4/25 f/u to 1:30 pm and adjusted remaining appointments for 4/25. Left message for patient and confirmed next appointment for 3/14. Patient to get new schedule 3/14.

## 2015-10-25 ENCOUNTER — Ambulatory Visit: Payer: 59

## 2015-10-25 ENCOUNTER — Ambulatory Visit (HOSPITAL_BASED_OUTPATIENT_CLINIC_OR_DEPARTMENT_OTHER): Payer: 59 | Admitting: Hematology

## 2015-10-25 ENCOUNTER — Ambulatory Visit: Payer: BC Managed Care – PPO

## 2015-10-25 ENCOUNTER — Encounter: Payer: Self-pay | Admitting: Hematology

## 2015-10-25 ENCOUNTER — Ambulatory Visit (HOSPITAL_BASED_OUTPATIENT_CLINIC_OR_DEPARTMENT_OTHER): Payer: 59

## 2015-10-25 ENCOUNTER — Other Ambulatory Visit (HOSPITAL_BASED_OUTPATIENT_CLINIC_OR_DEPARTMENT_OTHER): Payer: 59

## 2015-10-25 ENCOUNTER — Telehealth: Payer: Self-pay | Admitting: Hematology

## 2015-10-25 VITALS — BP 141/64 | HR 78

## 2015-10-25 VITALS — BP 168/72 | HR 85 | Temp 98.7°F | Resp 16 | Ht 70.0 in | Wt 228.1 lb

## 2015-10-25 DIAGNOSIS — C7800 Secondary malignant neoplasm of unspecified lung: Secondary | ICD-10-CM | POA: Diagnosis not present

## 2015-10-25 DIAGNOSIS — T451X5A Adverse effect of antineoplastic and immunosuppressive drugs, initial encounter: Secondary | ICD-10-CM

## 2015-10-25 DIAGNOSIS — Z171 Estrogen receptor negative status [ER-]: Secondary | ICD-10-CM

## 2015-10-25 DIAGNOSIS — Z95828 Presence of other vascular implants and grafts: Secondary | ICD-10-CM

## 2015-10-25 DIAGNOSIS — G620 Drug-induced polyneuropathy: Secondary | ICD-10-CM | POA: Diagnosis not present

## 2015-10-25 DIAGNOSIS — D630 Anemia in neoplastic disease: Secondary | ICD-10-CM

## 2015-10-25 DIAGNOSIS — C50911 Malignant neoplasm of unspecified site of right female breast: Secondary | ICD-10-CM

## 2015-10-25 DIAGNOSIS — Z5112 Encounter for antineoplastic immunotherapy: Secondary | ICD-10-CM

## 2015-10-25 LAB — CBC WITH DIFFERENTIAL/PLATELET
BASO%: 0.4 % (ref 0.0–2.0)
BASOS ABS: 0 10*3/uL (ref 0.0–0.1)
EOS%: 2.6 % (ref 0.0–7.0)
Eosinophils Absolute: 0.2 10*3/uL (ref 0.0–0.5)
HCT: 36.4 % (ref 34.8–46.6)
HGB: 12 g/dL (ref 11.6–15.9)
LYMPH%: 24 % (ref 14.0–49.7)
MCH: 28.8 pg (ref 25.1–34.0)
MCHC: 33.1 g/dL (ref 31.5–36.0)
MCV: 87.1 fL (ref 79.5–101.0)
MONO#: 0.5 10*3/uL (ref 0.1–0.9)
MONO%: 8.2 % (ref 0.0–14.0)
NEUT#: 4 10*3/uL (ref 1.5–6.5)
NEUT%: 64.8 % (ref 38.4–76.8)
PLATELETS: 211 10*3/uL (ref 145–400)
RBC: 4.18 10*6/uL (ref 3.70–5.45)
RDW: 14.7 % — ABNORMAL HIGH (ref 11.2–14.5)
WBC: 6.1 10*3/uL (ref 3.9–10.3)
lymph#: 1.5 10*3/uL (ref 0.9–3.3)

## 2015-10-25 LAB — COMPREHENSIVE METABOLIC PANEL
ALT: 18 U/L (ref 0–55)
AST: 18 U/L (ref 5–34)
Albumin: 3.6 g/dL (ref 3.5–5.0)
Alkaline Phosphatase: 85 U/L (ref 40–150)
Anion Gap: 7 mEq/L (ref 3–11)
BUN: 11.6 mg/dL (ref 7.0–26.0)
CHLORIDE: 105 meq/L (ref 98–109)
CO2: 26 mEq/L (ref 22–29)
Calcium: 9.1 mg/dL (ref 8.4–10.4)
Creatinine: 0.8 mg/dL (ref 0.6–1.1)
EGFR: >90 mL/min/{1.73_m2} (ref >90)
GLUCOSE: 118 mg/dL (ref 70–140)
POTASSIUM: 3.9 meq/L (ref 3.5–5.1)
SODIUM: 138 meq/L (ref 136–145)
Total Bilirubin: <0.3 mg/dL (ref 0.20–1.20)
Total Protein: 7.9 g/dL (ref 6.4–8.3)

## 2015-10-25 MED ORDER — SODIUM CHLORIDE 0.9% FLUSH
10.0000 mL | INTRAVENOUS | Status: DC | PRN
  Administered 2015-10-25: 10 mL via INTRAVENOUS
  Filled 2015-10-25: qty 10

## 2015-10-25 MED ORDER — TRASTUZUMAB CHEMO INJECTION 440 MG
6.0000 mg/kg | Freq: Once | INTRAVENOUS | Status: AC
  Administered 2015-10-25: 567 mg via INTRAVENOUS
  Filled 2015-10-25: qty 27

## 2015-10-25 MED ORDER — SODIUM CHLORIDE 0.9 % IJ SOLN
10.0000 mL | INTRAMUSCULAR | Status: DC | PRN
  Administered 2015-10-25: 10 mL
  Filled 2015-10-25: qty 10

## 2015-10-25 MED ORDER — ACETAMINOPHEN 325 MG PO TABS
650.0000 mg | ORAL_TABLET | Freq: Once | ORAL | Status: AC
Start: 2015-10-25 — End: 2015-10-25
  Administered 2015-10-25: 650 mg via ORAL

## 2015-10-25 MED ORDER — HEPARIN SOD (PORK) LOCK FLUSH 100 UNIT/ML IV SOLN
500.0000 [IU] | Freq: Once | INTRAVENOUS | Status: AC | PRN
  Administered 2015-10-25: 500 [IU]
  Filled 2015-10-25: qty 5

## 2015-10-25 MED ORDER — ACETAMINOPHEN 325 MG PO TABS
ORAL_TABLET | ORAL | Status: AC
  Filled 2015-10-25: qty 2

## 2015-10-25 MED ORDER — SODIUM CHLORIDE 0.9 % IV SOLN
Freq: Once | INTRAVENOUS | Status: AC
  Administered 2015-10-25: 15:00:00 via INTRAVENOUS

## 2015-10-25 NOTE — Progress Notes (Addendum)
[facility] Follow up note   Patient Care Team: Pcp Not In System as PCP - General [name], DO (Family Medicine) Holley Bouche, NP as Nurse Practitioner (Nurse Practitioner) Stark Klein, MD as Consulting Physician (General Surgery) Arloa Koh, MD as Consulting Physician (Radiation Oncology) [name], MD as Consulting Physician (Hematology)  CHIEF COMPLAINTS Follow up metastatic breast cancer  Oncology History   Breast cancer 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   Staging form: Breast, AJCC 7th Edition     Clinical stage from 07/22/2014: Stage IV (T3, N1, M1) - Unsigned       Breast cancer 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 ([place])   07/02/2014 Mammogram Mammogram showed a 2cm right beast mass and a 1.8cm right axillary node. 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 Bilateral pulmonary lesions with left hilar lymphadenopathy also markedly decreased in the interval. The left hilar lymphadenopathy has resolved.   12/20/2014 Imaging restaging CT showed stable disease, no new lesions    03/29/2015 Pathology Results  right breast lumpectomy showed  chemotherapy treatment effect,  a 1 mm residual tumor,   margins were widely negative, 5 sentinel lymph nodes and 2 axillary lymph  nodes were negative.    03/29/2015 Surgery  right breast lumpectomy and sentinel lymph node biopsy  by Dr. BBarry Dienes  04/12/2015 -  Chemotherapy  Herceptin maintenance therapy , 6 mg/kg, every 3 weeks   05/03/2015 - 06/14/2015 Radiation Therapy right breast adjuvant irradiation by Dr. MValere Dross    CURRENT THERAPY: Herceptin maintenance therapy every 3 weeks, started on 04/12/2015  INTERVAL HISTROY   NAyamereturns for follow-up and Herceptin treatment. She is doing well overall. Her cough has much improved, she still has residual dry cough occasionally, no dyspnea or chest pain. She has been back to work full-time, tolerating well, also busy with her kids activities. She denies any significant pain, or other symptoms. She has good appetite and eating well. She gained about 17 lbs in the past few months.   MEDICAL HISTORY:  Past Medical History  Diagnosis Date  . Pneumonia     hx of pneumonia 08/2013   . GERD (gastroesophageal reflux disease)     during pregnancy   . S/P radiation therapy 05/03/2015 through 06/14/2015     Right breast 4680 cGy in 26 sessions, right breast boost 1000 cGy in 5 sessions. Right supraclavicular/axillary region 4680 cGy with a supplemental PA field to bring the axillary dose up to 4500 cGy in 26 sessions  . [place]   . Breast [facility] ([facility]     SURGICAL HISTORY: Past Surgical History  Procedure Laterality Date  . Cesarean section    . Essure tubal ligation    . Cholecystectomy    . Wisdom tooth extraction    . Portacath placement  Left 07/21/2014    Procedure: INSERTION PORT-A-CATH;  Surgeon: Stark Klein, MD;  Location: [facility];  Service: General;  Laterality: Left;  . Breast lumpectomy with radioactive seed and sentinel lymph node biopsy Right 03/29/2015    Procedure: BREAST LUMPECTOMY WITH RADIOACTIVE SEED AND SENTINEL LYMPH NODE BIOPSY;  Surgeon: Stark Klein, MD;  Location: [place] [facility];   Service: General;  Laterality: Right;    SOCIAL HISTORY: History   Social History  . Marital Status: Married    Spouse Name: N/A    Number of Children:  she has 2 daughters at the age of 56 and 18.   . Years of Education: N/A   Occupational History  .  works as Freight forwarder for a [employer].    Social History Main Topics  . Smoking status: Never Smoker   . Smokeless tobacco: Not on file  . Alcohol Use: Yes  . Drug Use: No  . Sexual Activity: No    FAMILY HISTORY: Family History  Problem Relation Age of Onset  . Breast cancer Mother 49  . Liver cancer Father 52  . Breast cancer Maternal Aunt 36  . Prostate cancer Maternal Grandfather   . Liver cancer Paternal Grandmother   . Prostate cancer Paternal Grandfather      GENETICS: 08/30/2014 BreastNext panel was negative. 17 genes including BRCA1, BRCA2, were negative for mutations.   ALLERGIES:  is allergic to adhesive; aspirin; and codeine.  MEDICATIONS:  Current Outpatient Prescriptions  Medication Sig Dispense Refill  . albuterol (PROVENTIL HFA;VENTOLIN HFA) 108 (90 Base) MCG/ACT inhaler Inhale 2 puffs into the lungs every 6 (six) hours as needed for wheezing or shortness of breath. 1 Inhaler 2  . Camphor-Eucalyptus-Menthol (VICKS VAPORUB EX) Apply 1 application topically at bedtime. Applies under nose, throat and on chest.    . cetirizine (ZYRTEC) 10 MG tablet Take 10 mg by mouth daily as needed for allergies (allergies).     . chlorpheniramine-HYDROcodone (TUSSIONEX PENNKINETIC ER) 10-8 MG/5ML SUER Take 5 mLs by mouth every 12 (twelve) hours as needed for cough. 115 mL 0  . clindamycin (CLINDAGEL) 1 % gel Apply topically to affected areas BID. 60 g 3  . fluticasone (FLONASE) 50 MCG/ACT nasal spray   1  . hydrocortisone 2.5 % cream APPLY TOPICALLY TO THE AFFECTED AREA TWICE DAILY 454 g 0  . lidocaine-prilocaine (EMLA) cream Apply topically as needed. 30 g 2  . loperamide (IMODIUM A-D) 2 MG tablet Take 1 tablet (2 mg  total) by mouth 4 (four) times daily as needed for diarrhea or loose stools. 30 tablet 0  . LORazepam (ATIVAN) 1 MG tablet Take 1 tablet (1 mg total) by mouth once. 2 tablet 0  . ondansetron (ZOFRAN) 8 MG tablet Take 1 tablet (8 mg total) by mouth every 8 (eight) hours as needed for nausea or vomiting. 30 tablet 3  . pantoprazole (PROTONIX) 20 MG tablet Take 1 tablet (20 mg total) by mouth daily. 30 tablet 3  . promethazine (PHENERGAN) 6.25 MG/5ML syrup Take 6.25 mg by mouth every 6 (six) hours as needed for nausea or vomiting. Reported on 10/04/2015    . Pseudoeph-Doxylamine-DM-APAP (NYQUIL PO) Take 30 mLs by mouth 2 (two) times daily as needed (cold/allergies).      No current facility-administered medications for this visit.   Facility-Administered Medications Ordered in Other Visits  Medication Dose Route Frequency Provider Last Rate Last Dose  . sodium chloride flush (NS) 0.9 % injection 10 mL  10 mL  Intravenous PRN [name], MD   10 mL at 10/25/15 1353    REVIEW OF SYSTEMS:   Constitutional: Denies fevers, chills or abnormal night sweats, (+) malaise  Eyes: Denies blurriness of vision, double vision or watery eyes Ears, nose, mouth, throat, and face: Denies mucositis or sore throat Respiratory: (+) productive cough, no dyspnea or wheezes Cardiovascular: Denies palpitation, chest discomfort or lower extremity swelling Gastrointestinal:  Denies nausea, heartburn or change in bowel habits Skin:(+)  skin pigmentation on bilateral forearms and left elbow Lymphatics: Denies new lymphadenopathy or easy bruising Neurological:Denies numbness, tingling or new weaknesses Behavioral/Psych: Mood is stable, no new changes  All other systems were reviewed with the patient and are negative.  PHYSICAL EXAMINATION: ECOG PERFORMANCE STATUS: 1 BP 168/72 mmHg  Pulse 85  Temp(Src) 98.7 F (37.1 C) (Oral)  Resp 16  Ht 5' 10"  (1.778 m)  Wt 228 lb 1.6 oz (103.465 kg)  BMI 32.73 kg/m2  SpO2  100% GENERAL:alert, no distress and comfortable SKIN: skin color, texture, turgor are normal, no rashes or significant lesions EYES: normal, conjunctiva are pink and non-injected, sclera clear OROPHARYNX:no exudate, no erythema and lips, buccal mucosa, and tongue normal  NECK: supple, thyroid normal size, non-tender, without nodularity LYMPH:  no palpable lymphadenopathy in the cervical, axillary or inguinal LUNGS: clear to auscultation and percussion with normal breathing effort HEART: regular rate & rhythm and no murmurs and no lower extremity edema ABDOMEN:abdomen soft, non-tender and normal bowel sounds Musculoskeletal:no cyanosis of digits and no clubbing  PSYCH: alert & oriented x 3 with fluent speech NEURO: no focal motor/sensory deficits Breasts:  Status post right breast lumpectomy and sentinel lymph node biopsy, incisions have healed well. There is a 3-4cm lump above her breast incision site, non-tender, corresponding to the seroma on the CT scan. (+) Moderate skin pigmentation of right breast secondary to radiation. Palpation of both breasts  and axillas revealed no other obvious mass that I could appreciate. Skin: (+) Skin pigmentation on hands and feet, lighter than before. No skin rashes.   LABORATORY DATA:  I have reviewed the data as listed CBC Latest Ref Rng 10/25/2015 10/04/2015 09/13/2015  WBC 3.9 - 10.3 10e3/uL 6.1 6.1 6.8  Hemoglobin 11.6 - 15.9 g/dL 12.0 12.1 12.3  Hematocrit 34.8 - 46.6 % 36.4 35.9 38.1  Platelets 145 - 400 10e3/uL 211 233 199    CMP Latest Ref Rng 10/04/2015 09/13/2015 08/22/2015  Glucose 70 - 140 mg/dl 85 102 95  BUN 7.0 - 26.0 mg/dL 11.0 22.0 15.1  Creatinine 0.6 - 1.1 mg/dL 0.8 0.9 0.9  Sodium 136 - 145 mEq/L 141 139 138  Potassium 3.5 - 5.1 mEq/L 3.8 3.8 3.9  Chloride 101 - 111 mmol/L - - -  CO2 22 - 29 mEq/L 28 24 28   Calcium 8.4 - 10.4 mg/dL 9.4 9.5 9.5  Total Protein 6.4 - 8.3 g/dL 7.6 7.9 8.5(H)  Total Bilirubin 0.20 - 1.20 mg/dL 0.43 0.50  0.61  Alkaline Phos 40 - 150 U/L 80 80 89  AST 5 - 34 U/L 23 17 27   ALT 0 - 55 U/L 20 23 17    CA 27.29  Status: Finalresult Visible to patient:  MyChart Nextappt: Today at 01:15 PM in [facility]) Dx:  Breast cancer 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, r...          Ref Range 56moago    CA 27.29 0.0 - 38.6 U/mL 20.9          PATHOLOGY REPORT  07/09/2014 #1 breast, right needle core biopsy more anterior -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 45)  Lung, biopsy, Left 07/26/2014 - HIGH GRADE CARCINOMA, SEE COMMENT. Microscopic Comment The carcinoma demonstrates the following immunophenotype: TTF-1 - negative expression. Napsin A- negative expression. CK5/6 - focal, moderate expression. estrogen receptor - negative expression. GCDFP- negative expression. The recent breast biopsy demonstrating Her2 amplified high grade invasive mammary carcinoma is noted ([id number]). Although the immunophenotype of the current case is non-sepcific, it strongly argues against primary lung adenocarcinoma. However, on re-review, the morphology of the current case is essentially identical to the primary mammary carcinoma. In lieu of further immunophenotyping, the tumor will be submitted for Her2 testing and the remaining tissue will be reserved for additional ancillary tumor testing. The results of the Her2 testing will be reported in an addendum. The case was discussed with Dr Burr Medico on 07/28/2015 and 07/29/2015 HER2 POSITIVE   Diagnosis 03/29/2015 1. Breast, lumpectomy, Right - INVASIVE DUCTAL CARCINOMA, SEE COMMENT. - SEE TUMOR SYNOPTIC TEMPLATE BELOW. 2. Lymph node, sentinel, biopsy, Right axillary #1 - ONE LYMPH NODE, NEGATIVE FOR TUMOR (0/1). - SEE COMMENT. 3.  Lymph node, sentinel, biopsy, Right axillary #2 - BENIGN FIBROFATTY SOFT TISSUE. - NEGATIVE FOR ATYPIA OR MALIGNANCY. - NEGATIVE FOR LYMPH NODE. 4. Lymph node, sentinel, biopsy, Right axillary #3 - ONE LYMPH NODE, NEGATIVE FOR TUMOR (0/1). - SEE COMMENT. 5. Lymph node, sentinel, biopsy, Right axillary #4 - ONE LYMPH NODE, NEGATIVE FOR TUMOR (0/1). - SEE COMMENT. 6. Lymph node, sentinel, biopsy, Right axillary #5 - ONE LYMPH NODE, NEGATIVE FOR TUMOR (0/1). - SEE COMMENT. 7. Lymph node, sentinel, biopsy, right axillary - ONE LYMPH NODE, NEGATIVE FOR TUMOR (0/1). - SEE COMMENT. 8. Lymph node, sentinel, biopsy, right axillary - ONE LYMPH NODE, NEGATIVE FOR TUMOR (0/1). - SEE COMMENT. 1 of 4 Amended copy Amended FINAL for [name] [name], [name] [name] ([id number]) Microscopic Comment 1. BREAST, INVASIVE TUMOR, WITH LYMPH NODES PRESENT Specimen, including laterality and lymph node sampling (sentinel, non-sentinel): Right breast with sentinel and non-sentinel lymph node sampling Procedure: Lumpectomy Histologic type: Ductal, see comment Grade: 2 of 3, see comment. Tubule formation: 3 Nuclear pleomorphism: 3 Mitotic: 1 Tumor size (glass slide measurement): 1 mm, see comment Margins: Invasive, distance to closest margin: See comment In-situ, distance to closest margin: N/A If margin positive, focally or broadly: N/A Lymphovascular invasion: Absent Ductal carcinoma in situ: Absent Grade: N/A Extensive intraductal component: N/A Lobular neoplasia: Absent Tumor focality: Unifocal, see comment Treatment effect: Present If present, treatment effect in breast tissue, lymph nodes or both: Both breast tissue and lymph nodes Extent of tumor: Skin: N/A Nipple: N/A Skeletal muscle: N/A Lymph nodes: Examined: 5 Sentinel 2 Non-sentinel 7 Total Lymph nodes with metastasis: 0 Isolated tumor cells (< 0.2 mm): N/A Micrometastasis: (> 0.2 mm and < 2.0 mm): N/A Macrometastasis: (> 2.0 mm):  N/A Extracapsular extension: N/A Breast prognostic profile: Estrogen receptor: Not repeated, previous study demonstrated 0% positivity ([id number]). Progesterone receptor: Not repeated, previous study demonstrated 0% positivity ([id number]) Her 2 neu: Demonstrates amplificaition ([id number]) Ki-67: Not repeated, previous study demonstrate 79% proliferation rate ([id number]). Non-neoplastic breast: Neoadjuvant related tissue changes and adenosis. TNM: ypT44m, ypN0, pMX Comments: Numerous representative sections including the 2.0 cm hemorrhagic tissue associated with X-shaped biopsy clip, the 2.0 cm ill defined lesion associated with M-shaped  clip, and intervening tissue were submitted for review. Slide sections from the tissue surrounding the X-shaped and M-shaped clips demonstrate tissue changes consistent with neoadjuvant related change. There is no invasive or in situ carcinom present at either site. However, in a representative section from the intervening tissue (slide G), there is a 1 mm focus of high grade invasive ductal carcinoma present. Given the minute size of the tumor, tumor grading is limited. The tumor is present within non-marginal tissue sections and is considered to be at least 0.5 cm from the nearest margin (medial).   Note: Amendment issued for modification of synoptic table, inadvertent typographical error. The change in the synoptic table was discussed with Dr Burr Medico on 04/07/2015. (CRR:ecj 04/07/2015) 2. , 4-8. In parts 2 and 4, there is extensive neoadjuvant related tissue changes including abundant foamy macrophages, fibroinflammatory reaction and dystrophic calcifications. The neoadjuvant tissue changes extend into perinodal soft tissue and are either focally or broadly present at the cauterized edge of the tissue submitted. There are no definitive features of malignancy present.  RADIOGRAPHIC STUDIES:  CT chest, abdomen and pelvis with contrast  08/25/2015 IMPRESSION: 1. Today's study demonstrates a positive response to therapy with decreased size of the previously noted left upper lobe nodule at site of treated metastasis, and resolution of the previously noted 2 right-sided pulmonary nodules, with evolving postradiation changes in the right lung, as detailed above. 2. Postoperative changes of lumpectomy in the medial aspect of the right breast, similar to prior examination with a small chronic postoperative seroma. No findings to suggest local recurrence of disease. 3. No evidence of metastatic disease to the abdomen or pelvis on today's examination. 4. Mild colonic diverticulosis without evidence of acute diverticulitis at this time. 5. Normal appendix. 6. Additional incidental findings, as above.  ECHO 06/09/2015 Study Conclusion - Left ventricle: The cavity size was normal. Global longitudinal strain -21.8%. Systolic function was vigorous. The estimated ejection fraction was in the range of 65% to 70%. Wall motion was normal; there were no regional wall motion abnormalities. Left ventricular diastolic function parameters were normal. - Aortic valve: Transvalvular velocity was within the normal range. There was no stenosis. There was no regurgitation. - Mitral valve: There was mild regurgitation. - Right ventricle: The cavity size was normal. Wall thickness was normal. Systolic function was normal. - Tricuspid valve: There was no regurgitation. - Pulmonic valve: There was trivial regurgitation.   ASSESSMENT & PLAN:  45 year old African-American female, premenopausal, without significant past medical history, presented with palpable right breast mass and right axillary mass.   1. 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 - I reviewed her breasts surgical pathology results with her in great detail ,  She has had fantastic partial response (near complete response) form 8 months of chemotherapy and antibody therapy.   The initial 6.3 cm breast mass has only 1 mm residual tumor,  And the previously 4.4 cm right axillary lymph node has had complete  Pathological response, all 7 nodes were negative for cancer. -I reviewed her restaging CT scans from 08/25/2015, the previous right upper lobe nodule has resolved, she has multiple infiltrates in the right upper/middle lobes, likely related to her prior breast radiation. No other evidence of disease. I reviewed the CT scan with her radiation oncologist Dr. WPablo Ledger  and she concurs that the change in the right lung is likely related to radiation.  -She is clinically doing very well, tumor marker CA 27.29 last month was normal, no clinical concern for disease progression or recurrence. -continue herceptin for now, lab reviewed, CBC normal, CMP still pending.  -Continue monitoring cardiac echo every 4 months, she will follow up with Dr. Haroldine Laws  -restaging CT in 6 weeks   2. Productive cough, bronchitis vs radiation pneumonitis -Her chest x-ray reviewed some infiltrates in the right middle lobe on 08/13/2015. -She received 2 courses of antibiotics -She responded to tapering dose of prednisone well -She is on albuterol as needed -Much improved now  3. Right breast mass at incision site -This is a seroma from her breast surgery, confirmed by CT  -Small on today's exam today  4. G1 peripheral neuropathy, skin pigmentation, hands and feet -improving. We'll continue to monitor closely.   Plan for today  -Continue Herceptin every 3 weeks, treatment today. -RTC in 6 weeks with CT before visit   I spent about 20 minutes counseling the patient and her family members, total care was about 25 minutes.  [name]  10/25/2015

## 2015-10-25 NOTE — Patient Instructions (Signed)
[facility] Discharge Instructions for Patients  Today you received the following: Herceptin   To help prevent nausea and vomiting after your treatment, we encourage you to take your nausea medication as directed.   If you develop nausea and vomiting that is not controlled by your nausea medication, call the clinic.   BELOW ARE SYMPTOMS THAT SHOULD BE REPORTED IMMEDIATELY:  *FEVER GREATER THAN 100.5 F  *CHILLS WITH OR WITHOUT FEVER  NAUSEA AND VOMITING THAT IS NOT CONTROLLED WITH YOUR NAUSEA MEDICATION  *UNUSUAL SHORTNESS OF BREATH  *UNUSUAL BRUISING OR BLEEDING  TENDERNESS IN MOUTH AND THROAT WITH OR WITHOUT PRESENCE OF ULCERS  *URINARY PROBLEMS  *BOWEL PROBLEMS  UNUSUAL RASH Items with * indicate a potential emergency and should be followed up as soon as possible.  Feel free to call the clinic you have any questions or concerns. The clinic phone number is [phone].  Please show the CHEMO ALERT CARD at check-in to the Emergency Department and triage nurse.   

## 2015-10-25 NOTE — Patient Instructions (Signed)

## 2015-10-25 NOTE — Telephone Encounter (Signed)
CT CAP to be scheduled by central radiology per 3/14 pof

## 2015-10-26 ENCOUNTER — Encounter: Payer: Self-pay | Admitting: Hematology

## 2015-10-26 NOTE — Progress Notes (Signed)
Sent to medical records the forms that were faxed and emailed to patient-[employer] 08/25/15-(approx)

## 2015-11-15 ENCOUNTER — Ambulatory Visit: Payer: 59

## 2015-11-15 ENCOUNTER — Other Ambulatory Visit (HOSPITAL_BASED_OUTPATIENT_CLINIC_OR_DEPARTMENT_OTHER): Payer: 59

## 2015-11-15 ENCOUNTER — Ambulatory Visit: Payer: BC Managed Care – PPO

## 2015-11-15 ENCOUNTER — Ambulatory Visit (HOSPITAL_BASED_OUTPATIENT_CLINIC_OR_DEPARTMENT_OTHER): Payer: 59

## 2015-11-15 VITALS — BP 156/71 | HR 82 | Temp 97.6°F

## 2015-11-15 DIAGNOSIS — C50911 Malignant neoplasm of unspecified site of right female breast: Secondary | ICD-10-CM | POA: Diagnosis not present

## 2015-11-15 DIAGNOSIS — Z5112 Encounter for antineoplastic immunotherapy: Secondary | ICD-10-CM | POA: Diagnosis not present

## 2015-11-15 DIAGNOSIS — Z95828 Presence of other vascular implants and grafts: Secondary | ICD-10-CM

## 2015-11-15 DIAGNOSIS — C7800 Secondary malignant neoplasm of unspecified lung: Secondary | ICD-10-CM | POA: Diagnosis not present

## 2015-11-15 LAB — CBC WITH DIFFERENTIAL/PLATELET
BASO%: 0.2 % (ref 0.0–2.0)
Basophils Absolute: 0 10*3/uL (ref 0.0–0.1)
EOS%: 2.9 % (ref 0.0–7.0)
Eosinophils Absolute: 0.2 10*3/uL (ref 0.0–0.5)
HCT: 36.8 % (ref 34.8–46.6)
HGB: 12.3 g/dL (ref 11.6–15.9)
LYMPH#: 1.5 10*3/uL (ref 0.9–3.3)
LYMPH%: 26.4 % (ref 14.0–49.7)
MCH: 29.1 pg (ref 25.1–34.0)
MCHC: 33.4 g/dL (ref 31.5–36.0)
MCV: 87.2 fL (ref 79.5–101.0)
MONO#: 0.4 10*3/uL (ref 0.1–0.9)
MONO%: 7.1 % (ref 0.0–14.0)
NEUT%: 63.4 % (ref 38.4–76.8)
NEUTROS ABS: 3.5 10*3/uL (ref 1.5–6.5)
PLATELETS: 216 10*3/uL (ref 145–400)
RBC: 4.22 10*6/uL (ref 3.70–5.45)
RDW: 13.9 % (ref 11.2–14.5)
WBC: 5.5 10*3/uL (ref 3.9–10.3)

## 2015-11-15 LAB — COMPREHENSIVE METABOLIC PANEL
ALBUMIN: 3.4 g/dL — AB (ref 3.5–5.0)
ALT: 22 U/L (ref 0–55)
ANION GAP: 8 meq/L (ref 3–11)
AST: 25 U/L (ref 5–34)
Alkaline Phosphatase: 76 U/L (ref 40–150)
BUN: 14.4 mg/dL (ref 7.0–26.0)
CALCIUM: 9.4 mg/dL (ref 8.4–10.4)
CO2: 25 mEq/L (ref 22–29)
Chloride: 107 mEq/L (ref 98–109)
Creatinine: 0.9 mg/dL (ref 0.6–1.1)
EGFR: >90 mL/min/{1.73_m2} (ref >90)
Glucose: 127 mg/dl (ref 70–140)
Potassium: 4 mEq/L (ref 3.5–5.1)
SODIUM: 140 meq/L (ref 136–145)
TOTAL PROTEIN: 7.6 g/dL (ref 6.4–8.3)
Total Bilirubin: 0.35 mg/dL (ref 0.20–1.20)

## 2015-11-15 MED ORDER — TRASTUZUMAB CHEMO INJECTION 440 MG
6.0000 mg/kg | Freq: Once | INTRAVENOUS | Status: AC
  Administered 2015-11-15: 567 mg via INTRAVENOUS
  Filled 2015-11-15: qty 27

## 2015-11-15 MED ORDER — SODIUM CHLORIDE 0.9 % IJ SOLN
10.0000 mL | INTRAMUSCULAR | Status: DC | PRN
  Administered 2015-11-15: 10 mL
  Filled 2015-11-15: qty 10

## 2015-11-15 MED ORDER — SODIUM CHLORIDE 0.9% FLUSH
10.0000 mL | INTRAVENOUS | Status: DC | PRN
  Administered 2015-11-15: 10 mL via INTRAVENOUS
  Filled 2015-11-15: qty 10

## 2015-11-15 MED ORDER — ACETAMINOPHEN 325 MG PO TABS
ORAL_TABLET | ORAL | Status: AC
  Filled 2015-11-15: qty 2

## 2015-11-15 MED ORDER — SODIUM CHLORIDE 0.9 % IV SOLN
Freq: Once | INTRAVENOUS | Status: AC
  Administered 2015-11-15: 15:00:00 via INTRAVENOUS

## 2015-11-15 MED ORDER — ACETAMINOPHEN 325 MG PO TABS
650.0000 mg | ORAL_TABLET | Freq: Once | ORAL | Status: AC
  Administered 2015-11-15: 650 mg via ORAL

## 2015-11-15 MED ORDER — HEPARIN SOD (PORK) LOCK FLUSH 100 UNIT/ML IV SOLN
500.0000 [IU] | Freq: Once | INTRAVENOUS | Status: AC | PRN
  Administered 2015-11-15: 500 [IU]
  Filled 2015-11-15: qty 5

## 2015-11-15 NOTE — Patient Instructions (Signed)

## 2015-11-15 NOTE — Patient Instructions (Signed)
[facility] Discharge Instructions for Patients Receiving Chemotherapy  Today you received the following chemotherapy agents:  Herceptin  To help prevent nausea and vomiting after your treatment, we encourage you to take your nausea medication as prescribed.   If you develop nausea and vomiting that is not controlled by your nausea medication, call the clinic.   BELOW ARE SYMPTOMS THAT SHOULD BE REPORTED IMMEDIATELY:  *FEVER GREATER THAN 100.5 F  *CHILLS WITH OR WITHOUT FEVER  NAUSEA AND VOMITING THAT IS NOT CONTROLLED WITH YOUR NAUSEA MEDICATION  *UNUSUAL SHORTNESS OF BREATH  *UNUSUAL BRUISING OR BLEEDING  TENDERNESS IN MOUTH AND THROAT WITH OR WITHOUT PRESENCE OF ULCERS  *URINARY PROBLEMS  *BOWEL PROBLEMS  UNUSUAL RASH Items with * indicate a potential emergency and should be followed up as soon as possible.  Feel free to call the clinic you have any questions or concerns. The clinic phone number is [phone].  Please show the CHEMO ALERT CARD at check-in to the Emergency Department and triage nurse.   

## 2015-11-22 ENCOUNTER — Ambulatory Visit (HOSPITAL_COMMUNITY)
Admission: RE | Admit: 2015-11-22 | Discharge: 2015-11-22 | Disposition: A | Discharge status: Home / Self Care | Payer: 59 | Source: Ambulatory Visit | Attending: Hematology | Admitting: Hematology

## 2015-11-22 ENCOUNTER — Encounter (HOSPITAL_COMMUNITY): Payer: Self-pay

## 2015-11-22 ENCOUNTER — Other Ambulatory Visit (HOSPITAL_COMMUNITY): Payer: Self-pay | Admitting: Radiology

## 2015-11-22 DIAGNOSIS — Z9889 Other specified postprocedural states: Secondary | ICD-10-CM | POA: Diagnosis not present

## 2015-11-22 DIAGNOSIS — C50911 Malignant neoplasm of unspecified site of right female breast: Secondary | ICD-10-CM | POA: Diagnosis not present

## 2015-11-22 DIAGNOSIS — C7800 Secondary malignant neoplasm of unspecified lung: Secondary | ICD-10-CM | POA: Insufficient documentation

## 2015-11-22 DIAGNOSIS — N200 Calculus of kidney: Secondary | ICD-10-CM | POA: Diagnosis not present

## 2015-11-22 DIAGNOSIS — Z923 Personal history of irradiation: Secondary | ICD-10-CM | POA: Insufficient documentation

## 2015-11-22 DIAGNOSIS — N6489 Other specified disorders of breast: Secondary | ICD-10-CM | POA: Diagnosis not present

## 2015-11-22 MED ORDER — DIATRIZOATE MEGLUMINE & SODIUM 66-10 % PO SOLN: 30.0000 mL | Freq: Once | ORAL | Status: DC

## 2015-11-22 MED ORDER — IOPAMIDOL (ISOVUE-300) INJECTION 61%
100.0000 mL | Freq: Once | INTRAVENOUS | Status: AC | PRN
  Administered 2015-11-22: 100 mL via INTRAVENOUS

## 2015-11-22 MED ORDER — DIATRIZOATE MEGLUMINE & SODIUM 66-10 % PO SOLN
30.0000 mL | Freq: Once | ORAL | Status: AC
  Administered 2015-11-22: 30 mL via ORAL

## 2015-12-06 ENCOUNTER — Telehealth: Payer: Self-pay | Admitting: Hematology

## 2015-12-06 ENCOUNTER — Ambulatory Visit (HOSPITAL_BASED_OUTPATIENT_CLINIC_OR_DEPARTMENT_OTHER): Payer: 59

## 2015-12-06 ENCOUNTER — Ambulatory Visit: Payer: BC Managed Care – PPO

## 2015-12-06 ENCOUNTER — Other Ambulatory Visit: Payer: Self-pay

## 2015-12-06 ENCOUNTER — Other Ambulatory Visit (HOSPITAL_BASED_OUTPATIENT_CLINIC_OR_DEPARTMENT_OTHER): Payer: 59

## 2015-12-06 ENCOUNTER — Ambulatory Visit: Payer: 59

## 2015-12-06 ENCOUNTER — Ambulatory Visit (HOSPITAL_BASED_OUTPATIENT_CLINIC_OR_DEPARTMENT_OTHER): Payer: 59 | Admitting: Hematology

## 2015-12-06 VITALS — BP 149/83 | HR 75 | Temp 98.7°F | Resp 20 | Ht 70.0 in | Wt 226.9 lb

## 2015-12-06 DIAGNOSIS — C50911 Malignant neoplasm of unspecified site of right female breast: Secondary | ICD-10-CM

## 2015-12-06 DIAGNOSIS — Z171 Estrogen receptor negative status [ER-]: Secondary | ICD-10-CM | POA: Diagnosis not present

## 2015-12-06 DIAGNOSIS — Z5112 Encounter for antineoplastic immunotherapy: Secondary | ICD-10-CM | POA: Diagnosis not present

## 2015-12-06 DIAGNOSIS — C7800 Secondary malignant neoplasm of unspecified lung: Secondary | ICD-10-CM

## 2015-12-06 DIAGNOSIS — C7802 Secondary malignant neoplasm of left lung: Secondary | ICD-10-CM

## 2015-12-06 DIAGNOSIS — R05 Cough: Secondary | ICD-10-CM

## 2015-12-06 DIAGNOSIS — I10 Essential (primary) hypertension: Secondary | ICD-10-CM

## 2015-12-06 DIAGNOSIS — Z95828 Presence of other vascular implants and grafts: Secondary | ICD-10-CM | POA: Insufficient documentation

## 2015-12-06 LAB — CBC WITH DIFFERENTIAL/PLATELET
BASO%: 0.4 % (ref 0.0–2.0)
Basophils Absolute: 0 10*3/uL (ref 0.0–0.1)
EOS ABS: 0.1 10*3/uL (ref 0.0–0.5)
EOS%: 2.5 % (ref 0.0–7.0)
HCT: 36.8 % (ref 34.8–46.6)
HEMOGLOBIN: 11.9 g/dL (ref 11.6–15.9)
LYMPH%: 22.2 % (ref 14.0–49.7)
MCH: 28.2 pg (ref 25.1–34.0)
MCHC: 32.4 g/dL (ref 31.5–36.0)
MCV: 86.9 fL (ref 79.5–101.0)
MONO#: 0.5 10*3/uL (ref 0.1–0.9)
MONO%: 10 % (ref 0.0–14.0)
NEUT%: 64.9 % (ref 38.4–76.8)
NEUTROS ABS: 3.3 10*3/uL (ref 1.5–6.5)
PLATELETS: 202 10*3/uL (ref 145–400)
RBC: 4.23 10*6/uL (ref 3.70–5.45)
RDW: 14.2 % (ref 11.2–14.5)
WBC: 5.1 10*3/uL (ref 3.9–10.3)
lymph#: 1.1 10*3/uL (ref 0.9–3.3)

## 2015-12-06 LAB — COMPREHENSIVE METABOLIC PANEL
ALBUMIN: 3.4 g/dL — AB (ref 3.5–5.0)
ALK PHOS: 73 U/L (ref 40–150)
ALT: 25 U/L (ref 0–55)
AST: 20 U/L (ref 5–34)
Anion Gap: 7 mEq/L (ref 3–11)
BILIRUBIN TOTAL: 0.35 mg/dL (ref 0.20–1.20)
BUN: 13.8 mg/dL (ref 7.0–26.0)
CO2: 26 meq/L (ref 22–29)
CREATININE: 0.8 mg/dL (ref 0.6–1.1)
Calcium: 9.1 mg/dL (ref 8.4–10.4)
Chloride: 108 mEq/L (ref 98–109)
GLUCOSE: 105 mg/dL (ref 70–140)
Potassium: 3.7 mEq/L (ref 3.5–5.1)
SODIUM: 142 meq/L (ref 136–145)
TOTAL PROTEIN: 7.2 g/dL (ref 6.4–8.3)

## 2015-12-06 MED ORDER — ACETAMINOPHEN 325 MG PO TABS
650.0000 mg | ORAL_TABLET | Freq: Once | ORAL | Status: AC
  Administered 2015-12-06: 650 mg via ORAL

## 2015-12-06 MED ORDER — SODIUM CHLORIDE 0.9 % IV SOLN
Freq: Once | INTRAVENOUS | Status: AC
  Administered 2015-12-06: 15:00:00 via INTRAVENOUS

## 2015-12-06 MED ORDER — ACETAMINOPHEN 325 MG PO TABS
ORAL_TABLET | ORAL | Status: AC
  Filled 2015-12-06: qty 2

## 2015-12-06 MED ORDER — TRASTUZUMAB CHEMO INJECTION 440 MG
6.0000 mg/kg | Freq: Once | INTRAVENOUS | Status: AC
  Administered 2015-12-06: 567 mg via INTRAVENOUS
  Filled 2015-12-06: qty 27

## 2015-12-06 MED ORDER — HEPARIN SOD (PORK) LOCK FLUSH 100 UNIT/ML IV SOLN
500.0000 [IU] | Freq: Once | INTRAVENOUS | Status: AC | PRN
  Administered 2015-12-06: 500 [IU]
  Filled 2015-12-06: qty 5

## 2015-12-06 MED ORDER — SODIUM CHLORIDE 0.9 % IJ SOLN
10.0000 mL | INTRAMUSCULAR | Status: DC | PRN
  Administered 2015-12-06: 10 mL
  Filled 2015-12-06: qty 10

## 2015-12-06 NOTE — Telephone Encounter (Signed)
per pof to sch pt appt-sent MW email to sch trmt-pt to get updated copy on MY CHART

## 2015-12-06 NOTE — Progress Notes (Signed)
[facility] Follow up note   Patient Care Team: Pcp Not In System as PCP - General [name], DO (Family Medicine) Holley Bouche, NP as Nurse Practitioner (Nurse Practitioner) Stark Klein, MD as Consulting Physician (General Surgery) Arloa Koh, MD as Consulting Physician (Radiation Oncology) [name], MD as Consulting Physician (Hematology)  CHIEF COMPLAINTS Follow up metastatic breast cancer  Oncology History   Breast cancer 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   Staging form: Breast, AJCC 7th Edition     Clinical stage from 07/22/2014: Stage IV (T3, N1, M1) - Unsigned       Breast cancer 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 ([place])   07/02/2014 Mammogram Mammogram showed a 2cm right beast mass and a 1.8cm right axillary node. 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 Bilateral pulmonary lesions with left hilar lymphadenopathy also markedly decreased in the interval. The left hilar lymphadenopathy has resolved.   12/20/2014 Imaging restaging CT showed stable disease, no new lesions    03/29/2015 Pathology Results  right breast lumpectomy showed  chemotherapy treatment effect,  a 1 mm residual tumor,   margins were widely negative, 5 sentinel lymph nodes and 2 axillary lymph  nodes were negative.    03/29/2015 Surgery  right breast lumpectomy and sentinel lymph node biopsy  by Dr. BBarry Dienes  04/12/2015 -  Chemotherapy  Herceptin maintenance therapy , 6 mg/kg, every 3 weeks   05/03/2015 - 06/14/2015 Radiation Therapy right breast adjuvant irradiation by Dr. MValere Dross    CURRENT THERAPY: Herceptin maintenance therapy every 3 weeks, started on 04/12/2015  INTERVAL HISTROY   NDerareturns for follow-up and Herceptin treatment. She is doing well overall. She has some  Seasonal allergy, mild dry cough, no dyspnea or other complains. She has great appetite and energy level, works full time, and is busy at home also. No other new complaints.  MEDICAL HISTORY:  Past Medical History  Diagnosis Date  . Pneumonia     hx of pneumonia 08/2013   . GERD (gastroesophageal reflux disease)     during pregnancy   . S/P radiation therapy 05/03/2015 through 06/14/2015     Right breast 4680 cGy in 26 sessions, right breast boost 1000 cGy in 5 sessions. Right supraclavicular/axillary region 4680 cGy with a supplemental PA field to bring the axillary dose up to 4500 cGy in 26 sessions  . [place]   . Breast cancer ([facility]     SURGICAL HISTORY: Past Surgical History  Procedure Laterality Date  . Cesarean section    . Essure tubal ligation    . Cholecystectomy    . Wisdom tooth extraction    . Portacath placement Left 07/21/2014    Procedure: INSERTION PORT-A-CATH;  Surgeon: FStark Klein MD;  Location: [facility];  Service: General;  Laterality: Left;  .  Breast lumpectomy with radioactive seed and sentinel lymph node biopsy Right 03/29/2015    Procedure: BREAST LUMPECTOMY WITH RADIOACTIVE SEED AND SENTINEL LYMPH NODE BIOPSY;  Surgeon: Stark Klein, MD;  Location: [facility];  Service: General;  Laterality: Right;    SOCIAL HISTORY: History   Social History  . Marital Status: Married    Spouse Name: N/A     Number of Children:  she has 2 daughters at the age of 29 and 89.   . Years of Education: N/A   Occupational History  .  works as Freight forwarder for a [employer].    Social History Main Topics  . Smoking status: Never Smoker   . Smokeless tobacco: Not on file  . Alcohol Use: Yes  . Drug Use: No  . Sexual Activity: No    FAMILY HISTORY: Family History  Problem Relation Age of Onset  . Breast cancer Mother 13  . Liver cancer Father 2  . Breast cancer Maternal Aunt 36  . Prostate cancer Maternal Grandfather   . Liver cancer Paternal Grandmother   . Prostate cancer Paternal Grandfather      GENETICS: 08/30/2014 BreastNext panel was negative. 17 genes including BRCA1, BRCA2, were negative for mutations.   ALLERGIES:  is allergic to adhesive; aspirin; and codeine.  MEDICATIONS:  Current Outpatient Prescriptions  Medication Sig Dispense Refill  . albuterol (PROVENTIL HFA;VENTOLIN HFA) 108 (90 Base) MCG/ACT inhaler Inhale 2 puffs into the lungs every 6 (six) hours as needed for wheezing or shortness of breath. 1 Inhaler 2  . Camphor-Eucalyptus-Menthol (VICKS VAPORUB EX) Apply 1 application topically at bedtime. Applies under nose, throat and on chest.    . cetirizine (ZYRTEC) 10 MG tablet Take 10 mg by mouth daily as needed for allergies (allergies).     . chlorpheniramine-HYDROcodone (TUSSIONEX PENNKINETIC ER) 10-8 MG/5ML SUER Take 5 mLs by mouth every 12 (twelve) hours as needed for cough. 115 mL 0  . clindamycin (CLINDAGEL) 1 % gel Apply topically to affected areas BID. 60 g 3  . diatrizoate meglumine-sodium (GASTROGRAFIN) 66-10 % solution Take 30 mLs by mouth once. 30 mL 0  . fluticasone (FLONASE) 50 MCG/ACT nasal spray   1  . hydrocortisone 2.5 % cream APPLY TOPICALLY TO THE AFFECTED AREA TWICE DAILY 454 g 0  . lidocaine-prilocaine (EMLA) cream Apply topically as needed. 30 g 2  . loperamide (IMODIUM A-D) 2 MG tablet Take 1 tablet (2 mg total) by mouth 4 (four) times daily as  needed for diarrhea or loose stools. 30 tablet 0  . LORazepam (ATIVAN) 1 MG tablet Take 1 tablet (1 mg total) by mouth once. 2 tablet 0  . ondansetron (ZOFRAN) 8 MG tablet Take 1 tablet (8 mg total) by mouth every 8 (eight) hours as needed for nausea or vomiting. 30 tablet 3  . pantoprazole (PROTONIX) 20 MG tablet Take 1 tablet (20 mg total) by mouth daily. 30 tablet 3  . promethazine (PHENERGAN) 6.25 MG/5ML syrup Take 6.25 mg by mouth every 6 (six) hours as needed for nausea or vomiting. Reported on 10/04/2015    . Pseudoeph-Doxylamine-DM-APAP (NYQUIL PO) Take 30 mLs by mouth 2 (two) times daily as needed (cold/allergies).      No current facility-administered medications for this visit.    REVIEW OF SYSTEMS:   Constitutional: Denies fevers, chills or abnormal night sweats, (+) malaise  Eyes: Denies blurriness of vision, double vision or watery eyes Ears, nose, mouth, throat, and face: Denies mucositis or sore throat  Respiratory: (+) productive cough, no dyspnea or wheezes Cardiovascular: Denies palpitation, chest discomfort or lower extremity swelling Gastrointestinal:  Denies nausea, heartburn or change in bowel habits Skin:(+)  skin pigmentation on bilateral forearms and left elbow Lymphatics: Denies new lymphadenopathy or easy bruising Neurological:Denies numbness, tingling or new weaknesses Behavioral/Psych: Mood is stable, no new changes  All other systems were reviewed with the patient and are negative.  PHYSICAL EXAMINATION: ECOG PERFORMANCE STATUS: 0 Pressure 149/83, heart rate 75, respiratory rate 20, temperature 98.7, pulse ox 100% on room air, weight 226 lbs GENERAL:alert, no distress and comfortable SKIN: skin color, texture, turgor are normal, no rashes or significant lesions EYES: normal, conjunctiva are pink and non-injected, sclera clear OROPHARYNX:no exudate, no erythema and lips, buccal mucosa, and tongue normal  NECK: supple, thyroid normal size, non-tender, without  nodularity LYMPH:  no palpable lymphadenopathy in the cervical, axillary or inguinal LUNGS: clear to auscultation and percussion with normal breathing effort HEART: regular rate & rhythm and no murmurs and no lower extremity edema ABDOMEN:abdomen soft, non-tender and normal bowel sounds Musculoskeletal:no cyanosis of digits and no clubbing  PSYCH: alert & oriented x 3 with fluent speech NEURO: no focal motor/sensory deficits Breasts:  Status post right breast lumpectomy and sentinel lymph node biopsy, incisions have healed well. There is a small lump above her breast incision site, non-tender, corresponding to the seroma on the CT scan. (+) mild skin pigmentation of right breast secondary to radiation. Palpation of both breasts  and axillas revealed no other obvious mass that I could appreciate. Skin: No skin rashes.   LABORATORY DATA:  I have reviewed the data as listed CBC Latest Ref Rng 11/15/2015 10/25/2015 10/04/2015  WBC 3.9 - 10.3 10e3/uL 5.5 6.1 6.1  Hemoglobin 11.6 - 15.9 g/dL 12.3 12.0 12.1  Hematocrit 34.8 - 46.6 % 36.8 36.4 35.9  Platelets 145 - 400 10e3/uL 216 211 233    CMP Latest Ref Rng 11/15/2015 10/25/2015 10/04/2015  Glucose 70 - 140 mg/dl 127 118 85  BUN 7.0 - 26.0 mg/dL 14.4 11.6 11.0  Creatinine 0.6 - 1.1 mg/dL 0.9 0.8 0.8  Sodium 136 - 145 mEq/L 140 138 141  Potassium 3.5 - 5.1 mEq/L 4.0 3.9 3.8  CO2 22 - 29 mEq/L _0 Calcium 8.4 - 10.4 mg/dL 9.4 9.1 9.4  Total Protein 6.4 - 8.3 g/dL 7.6 7.9 7.6  Total Bilirubin 0.20 - 1.20 mg/dL 0.35 <0.30 0.43  Alkaline Phos 40 - 150 U/L 76 85 80  AST 5 - 34 U/L _1 ALT 0 - 55 U/L _2 PATHOLOGY REPORT 07/09/2014 #1 breast, right needle core biopsy more anterior -Invasive ductal carcinoma -Ductal carcinoma in situ #2 breast, right needle core biopsy, posterior -Invasive ductal carcinoma #3 lymph node, needle core biopsy, axillary -Ductal carcinoma  ER negative, PR negative, HER-2 positive (Copy number:  9.35, ration 6. 45)  Lung, biopsy, Left 07/26/2014 - HIGH GRADE CARCINOMA, SEE COMMENT. Microscopic Comment The carcinoma demonstrates the following immunophenotype: TTF-1 - negative expression. Napsin A- negative expression. CK5/6 - focal, moderate expression. estrogen receptor - negative expression. GCDFP- negative expression. The recent breast biopsy demonstrating Her2 amplified high grade invasive mammary carcinoma is noted ([id number]). Although the immunophenotype of the current case is non-sepcific, it strongly argues against primary lung adenocarcinoma. However, on re-review, the morphology of the current case is essentially identical to the primary mammary carcinoma. In lieu of further immunophenotyping, the tumor will be submitted for Her2  testing and the remaining tissue will be reserved for additional ancillary tumor testing. The results of the Her2 testing will be reported in an addendum. The case was discussed with Dr Burr Medico on 07/28/2015 and 07/29/2015 HER2 POSITIVE   Diagnosis 03/29/2015 1. Breast, lumpectomy, Right - INVASIVE DUCTAL CARCINOMA, SEE COMMENT. - SEE TUMOR SYNOPTIC TEMPLATE BELOW. 2. Lymph node, sentinel, biopsy, Right axillary #1 - ONE LYMPH NODE, NEGATIVE FOR TUMOR (0/1). - SEE COMMENT. 3. Lymph node, sentinel, biopsy, Right axillary #2 - BENIGN FIBROFATTY SOFT TISSUE. - NEGATIVE FOR ATYPIA OR MALIGNANCY. - NEGATIVE FOR LYMPH NODE. 4. Lymph node, sentinel, biopsy, Right axillary #3 - ONE LYMPH NODE, NEGATIVE FOR TUMOR (0/1). - SEE COMMENT. 5. Lymph node, sentinel, biopsy, Right axillary #4 - ONE LYMPH NODE, NEGATIVE FOR TUMOR (0/1). - SEE COMMENT. 6. Lymph node, sentinel, biopsy, Right axillary #5 - ONE LYMPH NODE, NEGATIVE FOR TUMOR (0/1). - SEE COMMENT. 7. Lymph node, sentinel, biopsy, right axillary - ONE LYMPH NODE, NEGATIVE FOR TUMOR (0/1). - SEE COMMENT. 8. Lymph node, sentinel, biopsy, right axillary - ONE LYMPH NODE, NEGATIVE FOR TUMOR  (0/1). - SEE COMMENT. 1 of 4 Amended copy Amended FINAL for RAKEL, JUNIO ([id number]) Microscopic Comment 1. BREAST, INVASIVE TUMOR, WITH LYMPH NODES PRESENT Specimen, including laterality and lymph node sampling (sentinel, non-sentinel): Right breast with sentinel and non-sentinel lymph node sampling Procedure: Lumpectomy Histologic type: Ductal, see comment Grade: 2 of 3, see comment. Tubule formation: 3 Nuclear pleomorphism: 3 Mitotic: 1 Tumor size (glass slide measurement): 1 mm, see comment Margins: Invasive, distance to closest margin: See comment In-situ, distance to closest margin: N/A If margin positive, focally or broadly: N/A Lymphovascular invasion: Absent Ductal carcinoma in situ: Absent Grade: N/A Extensive intraductal component: N/A Lobular neoplasia: Absent Tumor focality: Unifocal, see comment Treatment effect: Present If present, treatment effect in breast tissue, lymph nodes or both: Both breast tissue and lymph nodes Extent of tumor: Skin: N/A Nipple: N/A Skeletal muscle: N/A Lymph nodes: Examined: 5 Sentinel 2 Non-sentinel 7 Total Lymph nodes with metastasis: 0 Isolated tumor cells (< 0.2 mm): N/A Micrometastasis: (> 0.2 mm and < 2.0 mm): N/A Macrometastasis: (> 2.0 mm): N/A Extracapsular extension: N/A Breast prognostic profile: Estrogen receptor: Not repeated, previous study demonstrated 0% positivity ([id number]). Progesterone receptor: Not repeated, previous study demonstrated 0% positivity ([id number]) Her 2 neu: Demonstrates amplificaition ([id number]) Ki-67: Not repeated, previous study demonstrate 79% proliferation rate ([id number]). Non-neoplastic breast: Neoadjuvant related tissue changes and adenosis. TNM: ypT59m, ypN0, pMX Comments: Numerous representative sections including the 2.0 cm hemorrhagic tissue associated with X-shaped biopsy clip, the 2.0 cm ill defined lesion associated with M-shaped clip, and intervening tissue  were submitted for review. Slide sections from the tissue surrounding the X-shaped and M-shaped clips demonstrate tissue changes consistent with neoadjuvant related change. There is no invasive or in situ carcinom present at either site. However, in a representative section from the intervening tissue (slide G), there is a 1 mm focus of high grade invasive ductal carcinoma present. Given the minute size of the tumor, tumor grading is limited. The tumor is present within non-marginal tissue sections and is considered to be at least 0.5 cm from the nearest margin (medial).   Note: Amendment issued for modification of synoptic table, inadvertent typographical error. The change in the synoptic table was discussed with Dr FBurr Medicoon 04/07/2015. (CRR:ecj 04/07/2015) 2. , 4-8. In parts 2 and 4, there is extensive neoadjuvant related tissue changes including abundant foamy macrophages, fibroinflammatory reaction and dystrophic calcifications.  The neoadjuvant tissue changes extend into perinodal soft tissue and are either focally or broadly present at the cauterized edge of the tissue submitted. There are no definitive features of malignancy present.  RADIOGRAPHIC STUDIES:  CT chest, abdomen and pelvis with contrast 11/22/2015 IMPRESSION: 1. Continued further decrease and cons acuity of the previously noted left upper lobe nodule representing a site of treated metastatic disease. Otherwise no suspicious pulmonary nodule or mass is identified. No new or progressive findings in the chest. 2. Persistent but decreased postoperative fluid collection in the medial right breast, likely representing a chronic seroma or hematoma. 3. No evidence for metastatic disease in the abdomen or pelvis. 4. 4 mm nonobstructing right renal stone.  ECHO 09/22/2015 Study Conclusions  - Left ventricle: The cavity size was normal. Systolic function was  normal. The estimated ejection fraction was in the range of 55%  to  60%. Wall motion was normal; there were no regional wall  motion abnormalities. Left ventricular diastolic function  parameters were normal. - Aortic valve: Trileaflet; normal thickness leaflets. There was no  regurgitation. - Aortic root: The aortic root was normal in size. - Left atrium: The atrium was normal in size. - Right ventricle: The cavity size was normal. Wall thickness was  normal. Systolic function was normal. - Right atrium: The atrium was normal in size. - Tricuspid valve: There was mild regurgitation. - Pulmonic valve: There was no regurgitation. - Pulmonary arteries: Systolic pressure was within the normal  range. - Inferior vena cava: The vessel was normal in size. - Pericardium, extracardiac: There was no pericardial effusion.  Impressions:  - Global longitudinal strain - 17.2% (decreased from prior 21.8%).  Low lateral S prime 6.64 cm/sec.    ASSESSMENT & PLAN:  45 year old African-American female, premenopausal, without significant past medical history, presented with palpable right breast mass and right axillary mass.   1. 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 - I reviewed her breasts surgical pathology results with her in great detail ,  She has had fantastic partial response (near complete response) form 8 months of chemotherapy and antibody therapy.   The initial 6.3 cm breast mass has only 1 mm residual tumor,  And the previously 4.4 cm right axillary lymph node has had complete  Pathological response, all 7 nodes were negative for cancer. -I reviewed her restaging CT scans from 11/22/2015, the previous infiltrative changes in right upper/middle lobes are much improved, no other change or new lesions. -She is  clinically doing very well, labs are normal, no clinical concern for disease progression or recurrence. -continue herceptin indefinitely if no disease progression or significant side effects -Continue monitoring cardiac echo every 4 months, she will follow up with Dr. BHaroldine Laws -restaging CT in 3 months   2. Mild dry cough -Possibly related to activity, and residual cough from previous radiation related lung change  3. Mild hypertension -her blood pressure has been slightly elevated lately, asymptomatic, will continue monitoring.    Plan for today  -Continue Herceptin every 3 weeks, treatment today. -RTC in 6 weeks for follow up, restaging CT in 3 months   I spent about 20 minutes counseling the patient and her family members, total care was about 25 minutes.  FTruitt Merle 12/06/2015

## 2015-12-06 NOTE — Patient Instructions (Signed)
[facility] Discharge Instructions for Patients Receiving Chemotherapy  Today you received the following chemotherapy agents:  Herceptin  To help prevent nausea and vomiting after your treatment, we encourage you to take your nausea medication as prescribed.   If you develop nausea and vomiting that is not controlled by your nausea medication, call the clinic.   BELOW ARE SYMPTOMS THAT SHOULD BE REPORTED IMMEDIATELY:  *FEVER GREATER THAN 100.5 F  *CHILLS WITH OR WITHOUT FEVER  NAUSEA AND VOMITING THAT IS NOT CONTROLLED WITH YOUR NAUSEA MEDICATION  *UNUSUAL SHORTNESS OF BREATH  *UNUSUAL BRUISING OR BLEEDING  TENDERNESS IN MOUTH AND THROAT WITH OR WITHOUT PRESENCE OF ULCERS  *URINARY PROBLEMS  *BOWEL PROBLEMS  UNUSUAL RASH Items with * indicate a potential emergency and should be followed up as soon as possible.  Feel free to call the clinic you have any questions or concerns. The clinic phone number is [phone].  Please show the CHEMO ALERT CARD at check-in to the Emergency Department and triage nurse.   

## 2015-12-06 NOTE — Patient Instructions (Signed)

## 2015-12-07 ENCOUNTER — Telehealth: Payer: Self-pay | Admitting: *Deleted

## 2015-12-07 NOTE — Telephone Encounter (Signed)
Per staff message and POF I have scheduled appts. Advised scheduler of appts. JMW  

## 2015-12-10 ENCOUNTER — Encounter: Payer: Self-pay | Admitting: Hematology

## 2015-12-21 ENCOUNTER — Telehealth: Payer: Self-pay | Admitting: *Deleted

## 2015-12-21 NOTE — Telephone Encounter (Signed)
patietn called and left message to move her appts on 5/16 to late as possible. I have moved appts and called left message with new times

## 2015-12-26 ENCOUNTER — Telehealth: Payer: Self-pay | Admitting: *Deleted

## 2015-12-26 NOTE — Telephone Encounter (Signed)
Patient called and moved her appts for tomorrow to earlier in the day.

## 2015-12-27 ENCOUNTER — Other Ambulatory Visit (HOSPITAL_BASED_OUTPATIENT_CLINIC_OR_DEPARTMENT_OTHER): Payer: 59

## 2015-12-27 ENCOUNTER — Other Ambulatory Visit: Payer: 59

## 2015-12-27 ENCOUNTER — Other Ambulatory Visit: Payer: Self-pay | Admitting: Hematology

## 2015-12-27 ENCOUNTER — Telehealth: Payer: Self-pay | Admitting: *Deleted

## 2015-12-27 ENCOUNTER — Ambulatory Visit (HOSPITAL_BASED_OUTPATIENT_CLINIC_OR_DEPARTMENT_OTHER): Payer: 59

## 2015-12-27 ENCOUNTER — Ambulatory Visit: Payer: 59

## 2015-12-27 VITALS — BP 155/81 | HR 88 | Temp 97.9°F

## 2015-12-27 DIAGNOSIS — C50911 Malignant neoplasm of unspecified site of right female breast: Secondary | ICD-10-CM

## 2015-12-27 DIAGNOSIS — C7800 Secondary malignant neoplasm of unspecified lung: Principal | ICD-10-CM

## 2015-12-27 DIAGNOSIS — C7802 Secondary malignant neoplasm of left lung: Secondary | ICD-10-CM

## 2015-12-27 DIAGNOSIS — Z5112 Encounter for antineoplastic immunotherapy: Secondary | ICD-10-CM | POA: Diagnosis not present

## 2015-12-27 LAB — CBC WITH DIFFERENTIAL/PLATELET
BASO%: 0.5 % (ref 0.0–2.0)
Basophils Absolute: 0 10*3/uL (ref 0.0–0.1)
EOS ABS: 0.2 10*3/uL (ref 0.0–0.5)
EOS%: 2.6 % (ref 0.0–7.0)
HEMATOCRIT: 38 % (ref 34.8–46.6)
HEMOGLOBIN: 12.4 g/dL (ref 11.6–15.9)
LYMPH#: 1.6 10*3/uL (ref 0.9–3.3)
LYMPH%: 25.4 % (ref 14.0–49.7)
MCH: 28.2 pg (ref 25.1–34.0)
MCHC: 32.6 g/dL (ref 31.5–36.0)
MCV: 86.6 fL (ref 79.5–101.0)
MONO#: 0.7 10*3/uL (ref 0.1–0.9)
MONO%: 10.8 % (ref 0.0–14.0)
NEUT%: 60.7 % (ref 38.4–76.8)
NEUTROS ABS: 3.9 10*3/uL (ref 1.5–6.5)
PLATELETS: 204 10*3/uL (ref 145–400)
RBC: 4.39 10*6/uL (ref 3.70–5.45)
RDW: 14.2 % (ref 11.2–14.5)
WBC: 6.5 10*3/uL (ref 3.9–10.3)

## 2015-12-27 LAB — COMPREHENSIVE METABOLIC PANEL
ALBUMIN: 3.6 g/dL (ref 3.5–5.0)
ALK PHOS: 75 U/L (ref 40–150)
ALT: 54 U/L (ref 0–55)
ANION GAP: 8 meq/L (ref 3–11)
AST: 35 U/L — ABNORMAL HIGH (ref 5–34)
BILIRUBIN TOTAL: 0.43 mg/dL (ref 0.20–1.20)
BUN: 17.3 mg/dL (ref 7.0–26.0)
CALCIUM: 9.3 mg/dL (ref 8.4–10.4)
CO2: 28 mEq/L (ref 22–29)
Chloride: 103 mEq/L (ref 98–109)
Creatinine: 0.9 mg/dL (ref 0.6–1.1)
EGFR: 84 mL/min/{1.73_m2} — AB (ref >90)
Glucose: 119 mg/dl (ref 70–140)
Potassium: 3.9 mEq/L (ref 3.5–5.1)
Sodium: 139 mEq/L (ref 136–145)
TOTAL PROTEIN: 7.6 g/dL (ref 6.4–8.3)

## 2015-12-27 MED ORDER — METAXALONE 800 MG PO TABS: 800.0000 mg | ORAL_TABLET | Freq: Three times a day (TID) | ORAL | Status: DC

## 2015-12-27 MED ORDER — SODIUM CHLORIDE 0.9 % IV SOLN
Freq: Once | INTRAVENOUS | Status: AC
  Administered 2015-12-27: 14:00:00 via INTRAVENOUS

## 2015-12-27 MED ORDER — ACETAMINOPHEN 325 MG PO TABS
650.0000 mg | ORAL_TABLET | Freq: Once | ORAL | Status: AC
  Administered 2015-12-27: 650 mg via ORAL

## 2015-12-27 MED ORDER — SODIUM CHLORIDE 0.9 % IV SOLN
6.0000 mg/kg | Freq: Once | INTRAVENOUS | Status: AC
  Administered 2015-12-27: 567 mg via INTRAVENOUS
  Filled 2015-12-27: qty 27

## 2015-12-27 MED ORDER — ACETAMINOPHEN 325 MG PO TABS
ORAL_TABLET | ORAL | Status: AC
  Filled 2015-12-27: qty 2

## 2015-12-27 NOTE — Telephone Encounter (Signed)
Call from Coralyn Mark, a Case Manager with [facility] requesting verification of phone number for this patient.  Coralyn Mark confirmed name and address.  Phone number Karna Christmas provided as [phone] incorrect.

## 2016-01-17 ENCOUNTER — Other Ambulatory Visit (HOSPITAL_BASED_OUTPATIENT_CLINIC_OR_DEPARTMENT_OTHER): Payer: 59

## 2016-01-17 ENCOUNTER — Encounter: Payer: Self-pay | Admitting: Hematology

## 2016-01-17 ENCOUNTER — Ambulatory Visit (HOSPITAL_BASED_OUTPATIENT_CLINIC_OR_DEPARTMENT_OTHER): Payer: 59

## 2016-01-17 ENCOUNTER — Ambulatory Visit: Payer: 59

## 2016-01-17 ENCOUNTER — Telehealth: Payer: Self-pay | Admitting: Hematology

## 2016-01-17 ENCOUNTER — Ambulatory Visit (HOSPITAL_BASED_OUTPATIENT_CLINIC_OR_DEPARTMENT_OTHER): Payer: 59 | Admitting: Hematology

## 2016-01-17 VITALS — BP 146/89 | HR 90 | Temp 98.7°F | Resp 18 | Ht 70.0 in | Wt 230.3 lb

## 2016-01-17 DIAGNOSIS — Z95828 Presence of other vascular implants and grafts: Secondary | ICD-10-CM

## 2016-01-17 DIAGNOSIS — C7800 Secondary malignant neoplasm of unspecified lung: Principal | ICD-10-CM

## 2016-01-17 DIAGNOSIS — T451X5A Adverse effect of antineoplastic and immunosuppressive drugs, initial encounter: Secondary | ICD-10-CM

## 2016-01-17 DIAGNOSIS — I10 Essential (primary) hypertension: Secondary | ICD-10-CM

## 2016-01-17 DIAGNOSIS — R05 Cough: Secondary | ICD-10-CM | POA: Diagnosis not present

## 2016-01-17 DIAGNOSIS — D630 Anemia in neoplastic disease: Secondary | ICD-10-CM

## 2016-01-17 DIAGNOSIS — Z171 Estrogen receptor negative status [ER-]: Secondary | ICD-10-CM

## 2016-01-17 DIAGNOSIS — C7802 Secondary malignant neoplasm of left lung: Secondary | ICD-10-CM

## 2016-01-17 DIAGNOSIS — Z5112 Encounter for antineoplastic immunotherapy: Secondary | ICD-10-CM

## 2016-01-17 DIAGNOSIS — C50911 Malignant neoplasm of unspecified site of right female breast: Secondary | ICD-10-CM

## 2016-01-17 DIAGNOSIS — G620 Drug-induced polyneuropathy: Secondary | ICD-10-CM

## 2016-01-17 LAB — COMPREHENSIVE METABOLIC PANEL
ALT: 27 U/L (ref 0–55)
AST: 21 U/L (ref 5–34)
Albumin: 3.6 g/dL (ref 3.5–5.0)
Alkaline Phosphatase: 77 U/L (ref 40–150)
Anion Gap: 7 mEq/L (ref 3–11)
BUN: 15.8 mg/dL (ref 7.0–26.0)
CO2: 28 meq/L (ref 22–29)
CREATININE: 1 mg/dL (ref 0.6–1.1)
Calcium: 9.7 mg/dL (ref 8.4–10.4)
Chloride: 106 mEq/L (ref 98–109)
EGFR: 76 mL/min/{1.73_m2} — ABNORMAL LOW (ref >90)
Glucose: 98 mg/dl (ref 70–140)
Potassium: 3.7 mEq/L (ref 3.5–5.1)
SODIUM: 141 meq/L (ref 136–145)
TOTAL PROTEIN: 8 g/dL (ref 6.4–8.3)

## 2016-01-17 LAB — CBC WITH DIFFERENTIAL/PLATELET
BASO%: 0.4 % (ref 0.0–2.0)
Basophils Absolute: 0 10*3/uL (ref 0.0–0.1)
EOS%: 2 % (ref 0.0–7.0)
Eosinophils Absolute: 0.1 10*3/uL (ref 0.0–0.5)
HCT: 36.8 % (ref 34.8–46.6)
HGB: 12.2 g/dL (ref 11.6–15.9)
LYMPH%: 30.8 % (ref 14.0–49.7)
MCH: 28.7 pg (ref 25.1–34.0)
MCHC: 33.2 g/dL (ref 31.5–36.0)
MCV: 86.6 fL (ref 79.5–101.0)
MONO#: 0.5 10*3/uL (ref 0.1–0.9)
MONO%: 8.3 % (ref 0.0–14.0)
NEUT%: 58.5 % (ref 38.4–76.8)
NEUTROS ABS: 3.3 10*3/uL (ref 1.5–6.5)
Platelets: 234 10*3/uL (ref 145–400)
RBC: 4.25 10*6/uL (ref 3.70–5.45)
RDW: 13.4 % (ref 11.2–14.5)
WBC: 5.6 10*3/uL (ref 3.9–10.3)
lymph#: 1.7 10*3/uL (ref 0.9–3.3)

## 2016-01-17 MED ORDER — TRASTUZUMAB CHEMO INJECTION 440 MG
6.0000 mg/kg | Freq: Once | INTRAVENOUS | Status: AC
  Administered 2016-01-17: 567 mg via INTRAVENOUS
  Filled 2016-01-17: qty 27

## 2016-01-17 MED ORDER — SODIUM CHLORIDE 0.9 % IV SOLN
Freq: Once | INTRAVENOUS | Status: AC
  Administered 2016-01-17: 15:00:00 via INTRAVENOUS

## 2016-01-17 MED ORDER — ACETAMINOPHEN 325 MG PO TABS
ORAL_TABLET | ORAL | Status: AC
  Filled 2016-01-17: qty 2

## 2016-01-17 MED ORDER — ACETAMINOPHEN 325 MG PO TABS
650.0000 mg | ORAL_TABLET | Freq: Once | ORAL | Status: AC
  Administered 2016-01-17: 650 mg via ORAL

## 2016-01-17 MED ORDER — SODIUM CHLORIDE 0.9 % IJ SOLN
10.0000 mL | INTRAMUSCULAR | Status: DC | PRN
  Administered 2016-01-17: 10 mL
  Filled 2016-01-17: qty 10

## 2016-01-17 MED ORDER — SODIUM CHLORIDE 0.9 % IJ SOLN
10.0000 mL | INTRAMUSCULAR | Status: DC | PRN
Start: 2016-01-17 — End: 2016-01-17
  Administered 2016-01-17: 10 mL via INTRAVENOUS
  Filled 2016-01-17: qty 10

## 2016-01-17 MED ORDER — HEPARIN SOD (PORK) LOCK FLUSH 100 UNIT/ML IV SOLN
500.0000 [IU] | Freq: Once | INTRAVENOUS | Status: AC | PRN
  Administered 2016-01-17: 500 [IU]
  Filled 2016-01-17: qty 5

## 2016-01-17 NOTE — Telephone Encounter (Signed)
Pt refused print out of avs

## 2016-01-17 NOTE — Progress Notes (Signed)
[facility] Follow up note   Patient Care Team: Pcp Not In System as PCP - General [name] Pickler, DO (Family Medicine) [name] Bouche, NP as Nurse Practitioner (Nurse Practitioner) [name] Klein, MD as Consulting Physician (General Surgery) [name] Koh, MD as Consulting Physician (Radiation Oncology) [name] Merle, MD as Consulting Physician (Hematology)  CHIEF COMPLAINTS Follow up metastatic breast cancer  Oncology History   Breast cancer 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   Staging form: Breast, AJCC 7th Edition     Clinical stage from 07/22/2014: Stage IV (T3, N1, M1) - Unsigned       Breast cancer 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 ([place])   07/02/2014 Mammogram Mammogram showed a 2cm right beast mass and a 1.8cm right axillary node. 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 Bilateral pulmonary lesions with left hilar lymphadenopathy also markedly decreased in the interval. The left hilar lymphadenopathy has resolved.   12/20/2014 Imaging restaging CT showed stable disease, no new lesions    03/29/2015 Pathology Results  right breast lumpectomy showed  chemotherapy treatment effect,  a 1 mm residual tumor,   margins were widely negative, 5 sentinel lymph nodes and 2 axillary lymph  nodes were negative.    03/29/2015 Surgery  right breast lumpectomy and sentinel lymph node biopsy  by Dr. BBarry [name]  04/12/2015 -  Chemotherapy  Herceptin maintenance therapy , 6 mg/kg, every 3 weeks   05/03/2015 - 06/14/2015 Radiation Therapy right breast adjuvant irradiation by Dr. MValere [name]    CURRENT THERAPY: Herceptin maintenance therapy every 3 weeks, started on 04/12/2015  INTERVAL HISTROY   [name] [name] for follow-up and Herceptin treatment. She is doing well overall. Her coughs are most resolved. She had frequent leg cramps, and intermittent right-sided back pain 3-4 weeks ago, I saw her 3 weeks ago in the infusion room and given her a prescription of muscle relaxant. She has been Calcium, magnesium also, her symptoms has much improved, that cramps are resolved, she only has smoked cramps on the bottom of her feet occasionally, once while she still has muscular pain on bilateral lateral back, more on the right side. No tenderness. No other new complaints. She works full-time, very busy at work, feels quite tired after work, has not exercised regularly, but plan to do so. Appetite overall very good, weight is stable. No other new complaints.  MEDICAL HISTORY:  Past Medical History  Diagnosis Date  . Pneumonia     hx of pneumonia 08/2013   . GERD (gastroesophageal reflux disease)     during pregnancy   . S/P radiation therapy 05/03/2015 through 06/14/2015     Right breast 4680 cGy in 26 sessions, right breast boost 1000 cGy in 5 sessions. Right supraclavicular/axillary region 4680 cGy with a supplemental PA field to bring the axillary dose up  to 4500 cGy in 26 sessions  . Breast cancer ([place])   . Breast cancer [facility])     SURGICAL HISTORY: Past Surgical History  Procedure Laterality Date  . Cesarean section    . Essure tubal ligation    . Cholecystectomy    . Wisdom tooth extraction    . Portacath placement Left 07/21/2014    Procedure:  INSERTION PORT-A-CATH;  Surgeon: [name] Klein, MD;  Location: [facility];  Service: General;  Laterality: Left;  . Breast lumpectomy with radioactive seed and sentinel lymph node biopsy Right 03/29/2015    Procedure: BREAST LUMPECTOMY WITH RADIOACTIVE SEED AND SENTINEL LYMPH NODE BIOPSY;  Surgeon: [name] Klein, MD;  Location: [facility];  Service: General;  Laterality: Right;    SOCIAL HISTORY: History   Social History  . Marital Status: Married    Spouse Name: [name] [name]    Number of Children:  she has 2 daughters at the age of 83 and 82.   . Years of Education: [name] [name]   Occupational History  .  works as Freight forwarder for a [employer].    Social History Main Topics  . Smoking status: Never Smoker   . Smokeless tobacco: Not on file  . Alcohol Use: Yes  . Drug Use: No  . Sexual Activity: No    FAMILY HISTORY: Family History  Problem Relation Age of Onset  . Breast cancer Mother 26  . Liver cancer Father 29  . Breast cancer Maternal Aunt 36  . Prostate cancer Maternal Grandfather   . Liver cancer Paternal Grandmother   . Prostate cancer Paternal Grandfather      GENETICS: 08/30/2014 BreastNext panel was negative. 17 genes including BRCA1, BRCA2, were negative for mutations.   ALLERGIES:  is allergic to adhesive; aspirin; and codeine.  MEDICATIONS:  Current Outpatient Prescriptions  Medication Sig Dispense Refill  . albuterol (PROVENTIL HFA;VENTOLIN HFA) 108 (90 Base) MCG/ACT inhaler Inhale 2 puffs into the lungs every 6 (six) hours as needed for wheezing or shortness of breath. 1 Inhaler 2  . Calcium-Phosphorus-Vitamin D (CALCIUM GUMMIES PO) Take by mouth daily.    . Camphor-Eucalyptus-Menthol (VICKS VAPORUB EX) Apply 1 application topically at bedtime. Applies under nose, throat and on chest.    . cetirizine (ZYRTEC) 10 MG tablet Take 10 mg by mouth daily as needed for allergies (allergies).     . chlorpheniramine-HYDROcodone (TUSSIONEX PENNKINETIC ER) 10-8 MG/5ML SUER  Take 5 mLs by mouth every 12 (twelve) hours as needed for cough. 115 mL 0  . clindamycin (CLINDAGEL) 1 % gel Apply topically to affected areas BID. 60 g 3  . fexofenadine (ALLEGRA) 180 MG tablet Take 180 mg by mouth daily as needed.  4  . fluticasone (FLONASE) 50 MCG/ACT nasal spray   1  . hydrocortisone 2.5 % cream APPLY TOPICALLY TO THE AFFECTED AREA TWICE DAILY 454 g 0  . lidocaine-prilocaine (EMLA) cream Apply topically as needed. 30 g 2  . loperamide (IMODIUM A-D) 2 MG tablet Take 1 tablet (2 mg total) by mouth 4 (four) times daily as needed for diarrhea or loose stools. 30 tablet 0  . metaxalone (SKELAXIN) 800 MG tablet Take 1 tablet (800 mg total) by mouth 3 (three) times daily. 30 tablet 0  . Multiple Vitamins-Minerals (MULTIVITAMIN GUMMIES ADULT PO) Take 1 each by mouth daily. Women's Vitafusion gummie    . ondansetron (ZOFRAN) 8 MG tablet Take 1 tablet (8 mg total) by mouth every 8 (eight) hours as needed for nausea or  vomiting. 30 tablet 3  . Pseudoeph-Doxylamine-DM-APAP (NYQUIL PO) Take 30 mLs by mouth 2 (two) times daily as needed (cold/allergies).      No current facility-administered medications for this visit.   Facility-Administered Medications Ordered in Other Visits  Medication Dose Route Frequency Provider Last Rate Last Dose  . sodium chloride 0.9 % injection 10 mL  10 mL Intravenous PRN [name] Merle, MD   10 mL at 01/17/16 1308    REVIEW OF SYSTEMS:   Constitutional: Denies fevers, chills or abnormal night sweats, (+) malaise  Eyes: Denies blurriness of vision, double vision or watery eyes Ears, nose, mouth, throat, and face: Denies mucositis or sore throat Respiratory: (+) productive cough, no dyspnea or wheezes Cardiovascular: Denies palpitation, chest discomfort or lower extremity swelling Gastrointestinal:  Denies nausea, heartburn or change in bowel habits Skin:(+)  skin pigmentation on bilateral forearms and left elbow Lymphatics: Denies new lymphadenopathy or easy  bruising Neurological:Denies numbness, tingling or new weaknesses Behavioral/Psych: Mood is stable, no new changes  All other systems were reviewed with the patient and are negative.  PHYSICAL EXAMINATION: ECOG PERFORMANCE STATUS: 0 Pressure 149/83, heart rate 75, respiratory rate 20, temperature 98.7, pulse ox 100% on room air, weight 226 lbs GENERAL:alert, no distress and comfortable SKIN: skin color, texture, turgor are normal, no rashes or significant lesions EYES: normal, conjunctiva are pink and non-injected, sclera clear OROPHARYNX:no exudate, no erythema and lips, buccal mucosa, and tongue normal  NECK: supple, thyroid normal size, non-tender, without nodularity LYMPH:  no palpable lymphadenopathy in the cervical, axillary or inguinal LUNGS: clear to auscultation and percussion with normal breathing effort HEART: regular rate & rhythm and no murmurs and no lower extremity edema ABDOMEN:abdomen soft, non-tender and normal bowel sounds Musculoskeletal:no cyanosis of digits and no clubbing, no tenderness on bilateral chest wall or spine PSYCH: alert & oriented x 3 with fluent speech NEURO: no focal motor/sensory deficits Breasts:  Status post right breast lumpectomy and sentinel lymph node biopsy, incisions have healed well. There is a small lump above her breast incision site, non-tender, corresponding to the seroma on the CT scan. (+) mild skin pigmentation of right breast secondary to radiation. Palpation of both breasts  and axillas revealed no other obvious mass that I could appreciate. Skin: No skin rashes.   LABORATORY DATA:  I have reviewed the data as listed CBC Latest Ref Rng 01/17/2016 12/27/2015 12/06/2015  WBC 3.9 - 10.3 10e3/uL 5.6 6.5 5.1  Hemoglobin 11.6 - 15.9 g/dL 12.2 12.4 11.9  Hematocrit 34.8 - 46.6 % 36.8 38.0 36.8  Platelets 145 - 400 10e3/uL 234 204 202    CMP Latest Ref Rng 01/17/2016 12/27/2015 12/06/2015  Glucose 70 - 140 mg/dl 98 119 105  BUN 7.0 - 26.0  mg/dL 15.8 17.3 13.8  Creatinine 0.6 - 1.1 mg/dL 1.0 0.9 0.8  Sodium 136 - 145 mEq/L 141 139 142  Potassium 3.5 - 5.1 mEq/L 3.7 3.9 3.7  CO2 22 - 29 mEq/L 28 28 26   Calcium 8.4 - 10.4 mg/dL 9.7 9.3 9.1  Total Protein 6.4 - 8.3 g/dL 8.0 7.6 7.2  Total Bilirubin 0.20 - 1.20 mg/dL <0.30 0.43 0.35  Alkaline Phos 40 - 150 U/L 77 75 73  AST 5 - 34 U/L 21 35(H) 20  ALT 0 - 55 U/L 27 54 25    PATHOLOGY REPORT 07/09/2014 #1 breast, right needle core biopsy more anterior -Invasive ductal carcinoma -Ductal carcinoma in situ #2 breast, right needle core biopsy, posterior -Invasive ductal carcinoma #  3 lymph node, needle core biopsy, axillary -Ductal carcinoma  ER negative, PR negative, HER-2 positive (Copy number: 9.35, ration 6. 45)  Lung, biopsy, Left 07/26/2014 - HIGH GRADE CARCINOMA, SEE COMMENT. Microscopic Comment The carcinoma demonstrates the following immunophenotype: TTF-1 - negative expression. Napsin A- negative expression. CK5/6 - focal, moderate expression. estrogen receptor - negative expression. GCDFP- negative expression. The recent breast biopsy demonstrating Her2 amplified high grade invasive mammary carcinoma is noted ([id number]). Although the immunophenotype of the current case is non-sepcific, it strongly argues against primary lung adenocarcinoma. However, on re-review, the morphology of the current case is essentially identical to the primary mammary carcinoma. In lieu of further immunophenotyping, the tumor will be submitted for Her2 testing and the remaining tissue will be reserved for additional ancillary tumor testing. The results of the Her2 testing will be reported in an addendum. The case was discussed with Dr Burr Medico on 07/28/2015 and 07/29/2015 HER2 POSITIVE   Diagnosis 03/29/2015 1. Breast, lumpectomy, Right - INVASIVE DUCTAL CARCINOMA, SEE COMMENT. - SEE TUMOR SYNOPTIC TEMPLATE BELOW. 2. Lymph node, sentinel, biopsy, Right axillary #1 - ONE LYMPH  NODE, NEGATIVE FOR TUMOR (0/1). - SEE COMMENT. 3. Lymph node, sentinel, biopsy, Right axillary #2 - BENIGN FIBROFATTY SOFT TISSUE. - NEGATIVE FOR ATYPIA OR MALIGNANCY. - NEGATIVE FOR LYMPH NODE. 4. Lymph node, sentinel, biopsy, Right axillary #3 - ONE LYMPH NODE, NEGATIVE FOR TUMOR (0/1). - SEE COMMENT. 5. Lymph node, sentinel, biopsy, Right axillary #4 - ONE LYMPH NODE, NEGATIVE FOR TUMOR (0/1). - SEE COMMENT. 6. Lymph node, sentinel, biopsy, Right axillary #5 - ONE LYMPH NODE, NEGATIVE FOR TUMOR (0/1). - SEE COMMENT. 7. Lymph node, sentinel, biopsy, right axillary - ONE LYMPH NODE, NEGATIVE FOR TUMOR (0/1). - SEE COMMENT. 8. Lymph node, sentinel, biopsy, right axillary - ONE LYMPH NODE, NEGATIVE FOR TUMOR (0/1). - SEE COMMENT. 1 of 4 Amended copy Amended FINAL for [name] [name], [name] [name] ([id number]) Microscopic Comment 1. BREAST, INVASIVE TUMOR, WITH LYMPH NODES PRESENT Specimen, including laterality and lymph node sampling (sentinel, non-sentinel): Right breast with sentinel and non-sentinel lymph node sampling Procedure: Lumpectomy Histologic type: Ductal, see comment Grade: 2 of 3, see comment. Tubule formation: 3 Nuclear pleomorphism: 3 Mitotic: 1 Tumor size (glass slide measurement): 1 mm, see comment Margins: Invasive, distance to closest margin: See comment In-situ, distance to closest margin: [name] [name] If margin positive, focally or broadly: [name] [name] Lymphovascular invasion: Absent Ductal carcinoma in situ: Absent Grade: [name] [name] Extensive intraductal component: [name] [name] Lobular neoplasia: Absent Tumor focality: Unifocal, see comment Treatment effect: Present If present, treatment effect in breast tissue, lymph nodes or both: Both breast tissue and lymph nodes Extent of tumor: Skin: [name] [name] Nipple: [name] [name] Skeletal muscle: [name] [name] Lymph nodes: Examined: 5 Sentinel 2 Non-sentinel 7 Total Lymph nodes with metastasis: 0 Isolated tumor cells (< 0.2 mm): [name] [name] Micrometastasis: (> 0.2 mm and <  2.0 mm): [name] [name] Macrometastasis: (> 2.0 mm): [name] [name] Extracapsular extension: [name] [name] Breast prognostic profile: Estrogen receptor: Not repeated, previous study demonstrated 0% positivity ([id number]). Progesterone receptor: Not repeated, previous study demonstrated 0% positivity ([id number]) Her 2 neu: Demonstrates amplificaition ([id number]) Ki-67: Not repeated, previous study demonstrate 79% proliferation rate ([id number]). Non-neoplastic breast: Neoadjuvant related tissue changes and adenosis. TNM: ypT9m, ypN0, pMX Comments: Numerous representative sections including the 2.0 cm hemorrhagic tissue associated with X-shaped biopsy clip, the 2.0 cm ill defined lesion associated with M-shaped clip, and intervening tissue were submitted for review. Slide sections from the tissue surrounding the X-shaped and M-shaped clips demonstrate tissue changes consistent with neoadjuvant related  change. There is no invasive or in situ carcinom present at either site. However, in a representative section from the intervening tissue (slide G), there is a 1 mm focus of high grade invasive ductal carcinoma present. Given the minute size of the tumor, tumor grading is limited. The tumor is present within non-marginal tissue sections and is considered to be at least 0.5 cm from the nearest margin (medial).   Note: Amendment issued for modification of synoptic table, inadvertent typographical error. The change in the synoptic table was discussed with Dr Burr Medico on 04/07/2015. (CRR:ecj 04/07/2015) 2. , 4-8. In parts 2 and 4, there is extensive neoadjuvant related tissue changes including abundant foamy macrophages, fibroinflammatory reaction and dystrophic calcifications. The neoadjuvant tissue changes extend into perinodal soft tissue and are either focally or broadly present at the cauterized edge of the tissue submitted. There are no definitive features of malignancy present.  RADIOGRAPHIC STUDIES:  CT chest, abdomen  and pelvis with contrast 11/22/2015 IMPRESSION: 1. Continued further decrease and cons acuity of the previously noted left upper lobe nodule representing a site of treated metastatic disease. Otherwise no suspicious pulmonary nodule or mass is identified. No new or progressive findings in the chest. 2. Persistent but decreased postoperative fluid collection in the medial right breast, likely representing a chronic seroma or hematoma. 3. No evidence for metastatic disease in the abdomen or pelvis. 4. 4 mm nonobstructing right renal stone.  ECHO 09/22/2015 Study Conclusions  - Left ventricle: The cavity size was normal. Systolic function was  normal. The estimated ejection fraction was in the range of 55%  to 60%. Wall motion was normal; there were no regional wall  motion abnormalities. Left ventricular diastolic function  parameters were normal. - Aortic valve: Trileaflet; normal thickness leaflets. There was no  regurgitation. - Aortic root: The aortic root was normal in size. - Left atrium: The atrium was normal in size. - Right ventricle: The cavity size was normal. Wall thickness was  normal. Systolic function was normal. - Right atrium: The atrium was normal in size. - Tricuspid valve: There was mild regurgitation. - Pulmonic valve: There was no regurgitation. - Pulmonary arteries: Systolic pressure was within the normal  range. - Inferior vena cava: The vessel was normal in size. - Pericardium, extracardiac: There was no pericardial effusion.  Impressions:  - Global longitudinal strain - 17.2% (decreased from prior 21.8%).  Low lateral S prime 6.64 cm/sec.    ASSESSMENT & PLAN:  45 year old African-American female, premenopausal, without significant past medical history, presented with palpable right breast mass and right axillary mass.   1. 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 - I reviewed her breasts surgical pathology results with her in great detail ,  She has had fantastic partial response (near complete response) form 8 months of chemotherapy and antibody therapy.   The initial 6.3 cm breast mass has only 1 mm residual tumor,  And the previously 4.4 cm right axillary lymph node has had complete  Pathological response, all 7 nodes were negative for cancer. -I reviewed her restaging CT scans from 11/22/2015, the previous infiltrative changes in right upper/middle lobes are much improved, no other change or new lesions. -She is clinically doing very well, labs are normal, no clinical concern for disease progression or recurrence. -continue  herceptin indefinitely if no disease progression or significant side effects -Continue monitoring cardiac echo every 4 months, she will follow up with Dr. Haroldine Laws  -restaging CT and bone scan in 5 weeks   2. Mild dry cough -Possibly related to allergy and residual cough from previous radiation related lung change -This is the most resolved now.  3. Mild hypertension -her blood pressure has been slightly elevated lately, asymptomatic, will continue monitoring.   4. Leg cramps and frank pain -Likely muscular in nature. Her leg cramps are most resolved. Her flank pain is much less frequent now -She uses Skelaxin as needed  Plan for today  -Continue Herceptin every 3 weeks, treatment today. -RTC in 6 weeks for follow up, restaging CT and bone scan one week before   I spent about 20 minutes counseling the patient and her family members, total care was about 25 minutes.  [name] [name]  01/17/2016

## 2016-01-17 NOTE — Patient Instructions (Signed)

## 2016-01-18 ENCOUNTER — Ambulatory Visit (HOSPITAL_BASED_OUTPATIENT_CLINIC_OR_DEPARTMENT_OTHER)
Admission: RE | Admit: 2016-01-18 | Discharge: 2016-01-18 | Disposition: A | Discharge status: Home / Self Care | Payer: 59 | Source: Ambulatory Visit | Attending: Internal Medicine | Admitting: Internal Medicine

## 2016-01-18 ENCOUNTER — Encounter (HOSPITAL_COMMUNITY): Payer: Self-pay | Admitting: Internal Medicine

## 2016-01-18 ENCOUNTER — Ambulatory Visit (HOSPITAL_COMMUNITY)
Admission: RE | Admit: 2016-01-18 | Discharge: 2016-01-18 | Disposition: A | Discharge status: Home / Self Care | Payer: 59 | Source: Ambulatory Visit | Attending: Internal Medicine | Admitting: Internal Medicine

## 2016-01-18 VITALS — BP 142/84 | HR 83 | Wt 229.0 lb

## 2016-01-18 DIAGNOSIS — C50919 Malignant neoplasm of unspecified site of unspecified female breast: Secondary | ICD-10-CM | POA: Diagnosis not present

## 2016-01-18 DIAGNOSIS — C7800 Secondary malignant neoplasm of unspecified lung: Secondary | ICD-10-CM

## 2016-01-18 LAB — ECHOCARDIOGRAM COMPLETE
CHL CUP STROKE VOLUME: 34 mL
E decel time: 169 msec
E/e' ratio: 7.71
FS: 22 % — AB (ref 28–44)
IV/PV OW: 1.1
LA ID, A-P, ES: 25 mm
LA diam end sys: 25 mm
LA vol index: 24.8 mL/m2
LADIAMINDEX: 1.08 cm/m2
LAVOL: 57.1 mL
LAVOLA4C: 69.9 mL
LV E/e' medial: 7.71
LV E/e'average: 7.71
LV PW d: 10.5 mm — AB (ref 0.6–1.1)
LV TDI E'LATERAL: 9.25
LV dias vol index: 27 mL/m2
LV e' LATERAL: 9.25 cm/s
LVDIAVOL: 63 mL (ref 46–106)
LVOT area: 2.84 cm2
LVOT diameter: 19 mm
LVSYSVOL: 28 mL (ref 14–42)
LVSYSVOLIN: 12 mL/m2
MV Dec: 169
MV Peak grad: 2 mmHg
MV pk E vel: 71.3 m/s
MVPKAVEL: 100 m/s
Simpson's disk: 55
TDI e' medial: 6.42

## 2016-01-18 NOTE — Progress Notes (Signed)
Patient ID: MIDGE MOMON, female   DOB: 26-Aug-1970, 45 y.o.   MRN: [id number] Patient ID: KEA CALLAN, female   DOB: 11-16-1970, 45 y.o.   MRN: [id number]   [place] NOTE  Patient Care Team: Pcp Not In System as PCP - General [name], DO (Family Medicine) Holley Bouche, NP as Nurse Practitioner (Nurse Practitioner) Stark Klein, MD as Consulting Physician (General Surgery) Arloa Koh, MD as Consulting Physician (Radiation Oncology) [name], MD as Consulting Physician (Hematology)  HPI:   Ms. Havener is a 45 y/o woman with Stage IV breast CA referred by Dr. Burr Medico for enrollment into the cardio-oncology clinic for surveillance while receiving Hercetin (starter 04/12/15 and on indefinitiely)  Oncology History   Breast cancer 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   Staging form: Breast, AJCC 7th Edition     Clinical stage from 07/22/2014: Stage IV (T3, N1, M1) - Unsigned       Breast cancer 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 ([place])   07/02/2014 Mammogram Mammogram showed a 2cm right beast mass and a 1.8cm right axillary node. 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 Bilateral pulmonary lesions with left hilar lymphadenopathy also markedly decreased in the interval. The left hilar  lymphadenopathy has resolved.   12/20/2014 Imaging restaging CT showed stable disease, no new lesions    03/29/2015 Pathology Results  right breast lumpectomy showed  chemotherapy treatment effect,  a 1 mm residual tumor,   margins were widely negative, 5 sentinel lymph nodes and 2 axillary lymph nodes were negative.    03/29/2015 Surgery  right breast lumpectomy and sentinel lymph node biopsy  by Dr. BBarry Dienes  04/12/2015 -  Chemotherapy  Herceptin maintenance therapy , 6 mg/kg, every 3 weeks   05/03/2015 - 06/14/2015 Radiation Therapy right breast adjuvant irradiation by Dr. MValere Dross    Denies h/o known cardiac disease. Has been receiving Herceptin since 8/16 without problem. No CP, SOB, edema, orthopnea or PND. Working with call center management.   Echo 3/16 55-60% Lat s' 12.0 cm/sec GLS -17.5% Echo 2/17 55-60%  Lat s' 11.2 cm/sec GLS -17.2% Echo 01/18/16 EF 60-65% lat s' 7.9 cm/sec (out of plane) GLS -20.2%   MEDICAL HISTORY:  Past Medical History  Diagnosis Date  . Pneumonia     hx of pneumonia 08/2013   . GERD (gastroesophageal reflux disease)     during pregnancy   . S/P radiation therapy 05/03/2015 through 06/14/2015     Right breast 4680 cGy in 26 sessions, right breast boost 1000 cGy in 5 sessions. Right supraclavicular/axillary region 4680 cGy with a supplemental PA field to bring the axillary dose up to 4500 cGy in 26 sessions  . [place]   . Breast cancer ([facility]     SURGICAL HISTORY: Past Surgical History  Procedure Laterality Date  .  Cesarean section    . Essure tubal ligation    . Cholecystectomy    . Wisdom tooth extraction    . Portacath placement Left 07/21/2014    Procedure: INSERTION PORT-A-CATH;  Surgeon: Stark Klein, MD;  Location: [facility];  Service: General;  Laterality: Left;  . Breast lumpectomy with radioactive seed and sentinel lymph node biopsy Right 03/29/2015    Procedure: BREAST LUMPECTOMY WITH  RADIOACTIVE SEED AND SENTINEL LYMPH NODE BIOPSY;  Surgeon: Stark Klein, MD;  Location: [facility];  Service: General;  Laterality: Right;    SOCIAL HISTORY: History   Social History  . Marital Status: Married    Spouse Name: N/A    Number of Children:  she has 2 daughters at the age of 69 and 93.   . Years of Education: N/A   Occupational History  .  works as Freight forwarder for a [employer].    Social History Main Topics  . Smoking status: Never Smoker   . Smokeless tobacco: Not on file  . Alcohol Use: Yes  . Drug Use: No  . Sexual Activity: No    FAMILY HISTORY: Family History  Problem Relation Age of Onset  . Breast cancer Mother 62  . Liver cancer Father 52  . Breast cancer Maternal Aunt 36  . Prostate cancer Maternal Grandfather   . Liver cancer Paternal Grandmother   . Prostate cancer Paternal Grandfather      GENETICS: 08/30/2014 BreastNext panel was negative. 17 genes including BRCA1, BRCA2, were negative for mutations.   ALLERGIES:  is allergic to adhesive; aspirin; and codeine.  MEDICATIONS:  Current Outpatient Prescriptions  Medication Sig Dispense Refill  . albuterol (PROVENTIL HFA;VENTOLIN HFA) 108 (90 Base) MCG/ACT inhaler Inhale 2 puffs into the lungs every 6 (six) hours as needed for wheezing or shortness of breath. 1 Inhaler 2  . Calcium-Phosphorus-Vitamin D (CALCIUM GUMMIES PO) Take by mouth daily.    . Camphor-Eucalyptus-Menthol (VICKS VAPORUB EX) Apply 1 application topically at bedtime. Applies under nose, throat and on chest.    . cetirizine (ZYRTEC) 10 MG tablet Take 10 mg by mouth daily as needed for allergies (allergies).     . chlorpheniramine-HYDROcodone (TUSSIONEX PENNKINETIC ER) 10-8 MG/5ML SUER Take 5 mLs by mouth every 12 (twelve) hours as needed for cough. 115 mL 0  . clindamycin (CLINDAGEL) 1 % gel Apply topically to affected areas BID. 60 g 3  . fexofenadine (ALLEGRA) 180 MG tablet Take 180 mg by mouth daily as needed.  4   . fluticasone (FLONASE) 50 MCG/ACT nasal spray   1  . hydrocortisone 2.5 % cream APPLY TOPICALLY TO THE AFFECTED AREA TWICE DAILY 454 g 0  . lidocaine-prilocaine (EMLA) cream Apply topically as needed. 30 g 2  . loperamide (IMODIUM A-D) 2 MG tablet Take 1 tablet (2 mg total) by mouth 4 (four) times daily as needed for diarrhea or loose stools. 30 tablet 0  . metaxalone (SKELAXIN) 800 MG tablet Take 1 tablet (800 mg total) by mouth 3 (three) times daily. 30 tablet 0  . Multiple Vitamins-Minerals (MULTIVITAMIN GUMMIES ADULT PO) Take 1 each by mouth daily. Women's Vitafusion gummie    . ondansetron (ZOFRAN) 8 MG tablet Take 1 tablet (8 mg total) by mouth every 8 (eight) hours as needed for nausea or vomiting. 30 tablet 3  . Pseudoeph-Doxylamine-DM-APAP (NYQUIL PO) Take 30 mLs by mouth 2 (two) times daily as needed (cold/allergies).      No current facility-administered medications for this  encounter.     PHYSICAL EXAMINATION: General:  Well appearing. No resp difficulty. +nasal congetion HEENT: normal Neck: supple. no JVD. Carotids 2+ bilat; no bruits. No lymphadenopathy or thryomegaly appreciated. + port-a-cath Cor: PMI nondisplaced. Regular rate & rhythm. No rubs, gallops or murmurs. Lungs: clear Abdomen: obese soft, nontender, nondistended. No hepatosplenomegaly. No bruits or masses. Good bowel sounds. Extremities: no cyanosis, clubbing, rash, edema Neuro: alert & orientedx3, cranial nerves grossly intact. moves all 4 extremities w/o difficulty. Affect pleasant   LABORATORY DATA:  I have reviewed the data as listed CBC Latest Ref Rng 01/17/2016 12/27/2015 12/06/2015  WBC 3.9 - 10.3 10e3/uL 5.6 6.5 5.1  Hemoglobin 11.6 - 15.9 g/dL 12.2 12.4 11.9  Hematocrit 34.8 - 46.6 % 36.8 38.0 36.8  Platelets 145 - 400 10e3/uL 234 204 202    CMP Latest Ref Rng 01/17/2016 12/27/2015 12/06/2015  Glucose 70 - 140 mg/dl 98 119 105  BUN 7.0 - 26.0 mg/dL 15.8 17.3 13.8  Creatinine 0.6 - 1.1 mg/dL 1.0 0.9  0.8  Sodium 136 - 145 mEq/L 141 139 142  Potassium 3.5 - 5.1 mEq/L 3.7 3.9 3.7  CO2 22 - 29 mEq/L _0 Calcium 8.4 - 10.4 mg/dL 9.7 9.3 9.1  Total Protein 6.4 - 8.3 g/dL 8.0 7.6 7.2  Total Bilirubin 0.20 - 1.20 mg/dL <0.30 0.43 0.35  Alkaline Phos 40 - 150 U/L 77 75 73  AST 5 - 34 U/L 21 35(H) 20  ALT 0 - 55 U/L 27 54 25     ASSESSMENT & PLAN:   1. R breast invasive ductal carcinoma, T3N1M1, stage IV, ER-/PR-/HER2+, with metastases to b/l lungs, biopsy confirmed I reviewed echos personally. EF and Doppler parameters stable. No HF on exam. Continue Herceptin. Repeat echo in 4 months. If stable at that time consider increasing surveillance window to q6 months given that she tolerated Herceptin well previously.   Glori Bickers MD 01/18/2016

## 2016-01-18 NOTE — Patient Instructions (Signed)
Follow up and Echo in 4 months with Dr.Bensimhon

## 2016-01-18 NOTE — Progress Notes (Signed)
  Echocardiogram 2D Echocardiogram has been performed.  Jennette Dubin 01/18/2016, 12:04 PM

## 2016-01-18 NOTE — Addendum Note (Signed)
Encounter addended by: Harvie Junior, CMA on: 01/18/2016 12:32 PM<BR>     Documentation filed: Dx Association, Patient Instructions Section, Orders

## 2016-02-06 ENCOUNTER — Other Ambulatory Visit: Payer: Self-pay | Admitting: *Deleted

## 2016-02-06 DIAGNOSIS — C7800 Secondary malignant neoplasm of unspecified lung: Principal | ICD-10-CM

## 2016-02-06 DIAGNOSIS — C50919 Malignant neoplasm of unspecified site of unspecified female breast: Secondary | ICD-10-CM

## 2016-02-07 ENCOUNTER — Other Ambulatory Visit (HOSPITAL_BASED_OUTPATIENT_CLINIC_OR_DEPARTMENT_OTHER): Payer: 59

## 2016-02-07 ENCOUNTER — Ambulatory Visit: Payer: 59

## 2016-02-07 ENCOUNTER — Ambulatory Visit: Payer: BC Managed Care – PPO

## 2016-02-07 ENCOUNTER — Other Ambulatory Visit: Payer: Self-pay | Admitting: Hematology

## 2016-02-07 ENCOUNTER — Ambulatory Visit (HOSPITAL_BASED_OUTPATIENT_CLINIC_OR_DEPARTMENT_OTHER): Payer: 59

## 2016-02-07 ENCOUNTER — Other Ambulatory Visit: Payer: BC Managed Care – PPO

## 2016-02-07 VITALS — BP 156/86 | HR 76 | Temp 98.4°F | Resp 18

## 2016-02-07 DIAGNOSIS — C50911 Malignant neoplasm of unspecified site of right female breast: Secondary | ICD-10-CM

## 2016-02-07 DIAGNOSIS — Z5112 Encounter for antineoplastic immunotherapy: Secondary | ICD-10-CM

## 2016-02-07 DIAGNOSIS — C7802 Secondary malignant neoplasm of left lung: Secondary | ICD-10-CM | POA: Diagnosis not present

## 2016-02-07 DIAGNOSIS — C7800 Secondary malignant neoplasm of unspecified lung: Principal | ICD-10-CM

## 2016-02-07 DIAGNOSIS — C50919 Malignant neoplasm of unspecified site of unspecified female breast: Secondary | ICD-10-CM

## 2016-02-07 LAB — CBC WITH DIFFERENTIAL/PLATELET
BASO%: 0.3 % (ref 0.0–2.0)
Basophils Absolute: 0 10*3/uL (ref 0.0–0.1)
EOS ABS: 0.2 10*3/uL (ref 0.0–0.5)
EOS%: 2.5 % (ref 0.0–7.0)
HCT: 35.1 % (ref 34.8–46.6)
HGB: 11.5 g/dL — ABNORMAL LOW (ref 11.6–15.9)
LYMPH#: 1.4 10*3/uL (ref 0.9–3.3)
LYMPH%: 23.2 % (ref 14.0–49.7)
MCH: 28.4 pg (ref 25.1–34.0)
MCHC: 32.8 g/dL (ref 31.5–36.0)
MCV: 86.5 fL (ref 79.5–101.0)
MONO#: 0.5 10*3/uL (ref 0.1–0.9)
MONO%: 8.3 % (ref 0.0–14.0)
NEUT%: 65.7 % (ref 38.4–76.8)
NEUTROS ABS: 4 10*3/uL (ref 1.5–6.5)
PLATELETS: 208 10*3/uL (ref 145–400)
RBC: 4.06 10*6/uL (ref 3.70–5.45)
RDW: 14.3 % (ref 11.2–14.5)
WBC: 6.1 10*3/uL (ref 3.9–10.3)

## 2016-02-07 LAB — COMPREHENSIVE METABOLIC PANEL
ALT: 26 U/L (ref 0–55)
ANION GAP: 8 meq/L (ref 3–11)
AST: 21 U/L (ref 5–34)
Albumin: 3.4 g/dL — ABNORMAL LOW (ref 3.5–5.0)
Alkaline Phosphatase: 71 U/L (ref 40–150)
BILIRUBIN TOTAL: 0.35 mg/dL (ref 0.20–1.20)
BUN: 11.2 mg/dL (ref 7.0–26.0)
CHLORIDE: 105 meq/L (ref 98–109)
CO2: 25 meq/L (ref 22–29)
CREATININE: 0.8 mg/dL (ref 0.6–1.1)
Calcium: 9.2 mg/dL (ref 8.4–10.4)
EGFR: >90 mL/min/{1.73_m2} (ref >90)
GLUCOSE: 101 mg/dL (ref 70–140)
Potassium: 3.7 mEq/L (ref 3.5–5.1)
SODIUM: 139 meq/L (ref 136–145)
TOTAL PROTEIN: 7.4 g/dL (ref 6.4–8.3)

## 2016-02-07 MED ORDER — ACETAMINOPHEN 325 MG PO TABS
650.0000 mg | ORAL_TABLET | Freq: Once | ORAL | Status: AC
  Administered 2016-02-07: 650 mg via ORAL

## 2016-02-07 MED ORDER — TRASTUZUMAB CHEMO INJECTION 440 MG
609.0000 mg | Freq: Once | INTRAVENOUS | Status: AC
  Administered 2016-02-07: 609 mg via INTRAVENOUS
  Filled 2016-02-07: qty 29

## 2016-02-07 MED ORDER — ACETAMINOPHEN 325 MG PO TABS
ORAL_TABLET | ORAL | Status: AC
  Filled 2016-02-07: qty 2

## 2016-02-07 MED ORDER — SODIUM CHLORIDE 0.9 % IJ SOLN
10.0000 mL | INTRAMUSCULAR | Status: DC | PRN
  Administered 2016-02-07: 10 mL
  Filled 2016-02-07: qty 10

## 2016-02-07 MED ORDER — SODIUM CHLORIDE 0.9 % IV SOLN
Freq: Once | INTRAVENOUS | Status: AC
  Administered 2016-02-07: 16:00:00 via INTRAVENOUS

## 2016-02-07 MED ORDER — SODIUM CHLORIDE 0.9 % IJ SOLN
10.0000 mL | Freq: Once | INTRAMUSCULAR | Status: AC
  Administered 2016-02-07: 10 mL
  Filled 2016-02-07: qty 10

## 2016-02-07 MED ORDER — HEPARIN SOD (PORK) LOCK FLUSH 100 UNIT/ML IV SOLN
500.0000 [IU] | Freq: Once | INTRAVENOUS | Status: AC | PRN
  Administered 2016-02-07: 500 [IU]
  Filled 2016-02-07: qty 5

## 2016-02-07 NOTE — Patient Instructions (Signed)
[facility] Discharge Instructions for Patients Receiving Chemotherapy  Today you received the following chemotherapy agents Herceptin  To help prevent nausea and vomiting after your treatment, we encourage you to take your nausea medication    If you develop nausea and vomiting that is not controlled by your nausea medication, call the clinic.   BELOW ARE SYMPTOMS THAT SHOULD BE REPORTED IMMEDIATELY:  *FEVER GREATER THAN 100.5 F  *CHILLS WITH OR WITHOUT FEVER  NAUSEA AND VOMITING THAT IS NOT CONTROLLED WITH YOUR NAUSEA MEDICATION  *UNUSUAL SHORTNESS OF BREATH  *UNUSUAL BRUISING OR BLEEDING  TENDERNESS IN MOUTH AND THROAT WITH OR WITHOUT PRESENCE OF ULCERS  *URINARY PROBLEMS  *BOWEL PROBLEMS  UNUSUAL RASH Items with * indicate a potential emergency and should be followed up as soon as possible.  Feel free to call the clinic you have any questions or concerns. The clinic phone number is [phone].  Please show the CHEMO ALERT CARD at check-in to the Emergency Department and triage nurse.   

## 2016-02-23 ENCOUNTER — Ambulatory Visit (HOSPITAL_COMMUNITY)
Admission: RE | Admit: 2016-02-23 | Discharge: 2016-02-23 | Disposition: A | Discharge status: Home / Self Care | Payer: 59 | Source: Ambulatory Visit | Attending: Hematology | Admitting: Hematology

## 2016-02-23 ENCOUNTER — Encounter (HOSPITAL_COMMUNITY): Payer: Self-pay

## 2016-02-23 ENCOUNTER — Encounter (HOSPITAL_COMMUNITY)
Admission: RE | Admit: 2016-02-23 | Discharge: 2016-02-23 | Disposition: A | Discharge status: Home / Self Care | Payer: 59 | Source: Ambulatory Visit | Attending: Hematology | Admitting: Hematology

## 2016-02-23 DIAGNOSIS — C7800 Secondary malignant neoplasm of unspecified lung: Principal | ICD-10-CM

## 2016-02-23 DIAGNOSIS — K760 Fatty (change of) liver, not elsewhere classified: Secondary | ICD-10-CM | POA: Diagnosis not present

## 2016-02-23 DIAGNOSIS — C50911 Malignant neoplasm of unspecified site of right female breast: Secondary | ICD-10-CM | POA: Insufficient documentation

## 2016-02-23 DIAGNOSIS — R937 Abnormal findings on diagnostic imaging of other parts of musculoskeletal system: Secondary | ICD-10-CM | POA: Insufficient documentation

## 2016-02-23 DIAGNOSIS — L7634 Postprocedural seroma of skin and subcutaneous tissue following other procedure: Secondary | ICD-10-CM | POA: Diagnosis not present

## 2016-02-23 DIAGNOSIS — C7802 Secondary malignant neoplasm of left lung: Secondary | ICD-10-CM | POA: Insufficient documentation

## 2016-02-23 MED ORDER — IOPAMIDOL (ISOVUE-300) INJECTION 61%
100.0000 mL | Freq: Once | INTRAVENOUS | Status: AC | PRN
  Administered 2016-02-23: 100 mL via INTRAVENOUS

## 2016-02-23 MED ORDER — DIATRIZOATE MEGLUMINE & SODIUM 66-10 % PO SOLN
30.0000 mL | Freq: Once | ORAL | Status: AC
  Administered 2016-02-23: 30 mL via ORAL
  Filled 2016-02-23: qty 30

## 2016-02-23 MED ORDER — TECHNETIUM TC 99M MEDRONATE IV KIT
26.6000 | PACK | Freq: Once | INTRAVENOUS | Status: AC | PRN
  Administered 2016-02-23: 26.6 via INTRAVENOUS

## 2016-02-28 ENCOUNTER — Other Ambulatory Visit: Payer: BC Managed Care – PPO

## 2016-02-28 ENCOUNTER — Other Ambulatory Visit (HOSPITAL_BASED_OUTPATIENT_CLINIC_OR_DEPARTMENT_OTHER): Payer: 59

## 2016-02-28 ENCOUNTER — Ambulatory Visit: Payer: BC Managed Care – PPO

## 2016-02-28 ENCOUNTER — Ambulatory Visit (HOSPITAL_BASED_OUTPATIENT_CLINIC_OR_DEPARTMENT_OTHER): Payer: 59

## 2016-02-28 ENCOUNTER — Ambulatory Visit (HOSPITAL_BASED_OUTPATIENT_CLINIC_OR_DEPARTMENT_OTHER): Payer: 59 | Admitting: Hematology

## 2016-02-28 ENCOUNTER — Encounter: Payer: Self-pay | Admitting: Hematology

## 2016-02-28 ENCOUNTER — Ambulatory Visit: Payer: 59

## 2016-02-28 ENCOUNTER — Ambulatory Visit: Payer: BC Managed Care – PPO | Admitting: Hematology

## 2016-02-28 VITALS — BP 154/75 | HR 80 | Temp 98.4°F | Resp 16 | Ht 70.0 in | Wt 233.7 lb

## 2016-02-28 DIAGNOSIS — Z5112 Encounter for antineoplastic immunotherapy: Secondary | ICD-10-CM

## 2016-02-28 DIAGNOSIS — C50911 Malignant neoplasm of unspecified site of right female breast: Secondary | ICD-10-CM | POA: Diagnosis not present

## 2016-02-28 DIAGNOSIS — G629 Polyneuropathy, unspecified: Secondary | ICD-10-CM

## 2016-02-28 DIAGNOSIS — G620 Drug-induced polyneuropathy: Secondary | ICD-10-CM

## 2016-02-28 DIAGNOSIS — T451X5A Adverse effect of antineoplastic and immunosuppressive drugs, initial encounter: Secondary | ICD-10-CM

## 2016-02-28 DIAGNOSIS — C7800 Secondary malignant neoplasm of unspecified lung: Principal | ICD-10-CM

## 2016-02-28 DIAGNOSIS — C7802 Secondary malignant neoplasm of left lung: Secondary | ICD-10-CM

## 2016-02-28 DIAGNOSIS — Z95828 Presence of other vascular implants and grafts: Secondary | ICD-10-CM

## 2016-02-28 DIAGNOSIS — C50919 Malignant neoplasm of unspecified site of unspecified female breast: Secondary | ICD-10-CM

## 2016-02-28 DIAGNOSIS — Z171 Estrogen receptor negative status [ER-]: Secondary | ICD-10-CM | POA: Diagnosis not present

## 2016-02-28 LAB — COMPREHENSIVE METABOLIC PANEL
ALT: 24 U/L (ref 0–55)
AST: 23 U/L (ref 5–34)
Albumin: 3.5 g/dL (ref 3.5–5.0)
Alkaline Phosphatase: 76 U/L (ref 40–150)
Anion Gap: 9 mEq/L (ref 3–11)
BUN: 11.5 mg/dL (ref 7.0–26.0)
CALCIUM: 9.2 mg/dL (ref 8.4–10.4)
CHLORIDE: 107 meq/L (ref 98–109)
CO2: 24 meq/L (ref 22–29)
CREATININE: 0.8 mg/dL (ref 0.6–1.1)
EGFR: >90 mL/min/{1.73_m2} (ref >90)
Glucose: 113 mg/dl (ref 70–140)
Potassium: 3.6 mEq/L (ref 3.5–5.1)
Sodium: 139 mEq/L (ref 136–145)
Total Bilirubin: 0.63 mg/dL (ref 0.20–1.20)
Total Protein: 7.5 g/dL (ref 6.4–8.3)

## 2016-02-28 LAB — CBC WITH DIFFERENTIAL/PLATELET
BASO%: 0.2 % (ref 0.0–2.0)
Basophils Absolute: 0 10*3/uL (ref 0.0–0.1)
EOS%: 2.3 % (ref 0.0–7.0)
Eosinophils Absolute: 0.1 10*3/uL (ref 0.0–0.5)
HEMATOCRIT: 36.9 % (ref 34.8–46.6)
HEMOGLOBIN: 12.3 g/dL (ref 11.6–15.9)
LYMPH#: 1.3 10*3/uL (ref 0.9–3.3)
LYMPH%: 22.2 % (ref 14.0–49.7)
MCH: 28.9 pg (ref 25.1–34.0)
MCHC: 33.2 g/dL (ref 31.5–36.0)
MCV: 87.1 fL (ref 79.5–101.0)
MONO#: 0.5 10*3/uL (ref 0.1–0.9)
MONO%: 8 % (ref 0.0–14.0)
NEUT#: 4.1 10*3/uL (ref 1.5–6.5)
NEUT%: 67.3 % (ref 38.4–76.8)
Platelets: 227 10*3/uL (ref 145–400)
RBC: 4.24 10*6/uL (ref 3.70–5.45)
RDW: 14.1 % (ref 11.2–14.5)
WBC: 6 10*3/uL (ref 3.9–10.3)

## 2016-02-28 MED ORDER — ACETAMINOPHEN 325 MG PO TABS
ORAL_TABLET | ORAL | Status: AC
  Filled 2016-02-28: qty 2

## 2016-02-28 MED ORDER — ACETAMINOPHEN 325 MG PO TABS
650.0000 mg | ORAL_TABLET | Freq: Once | ORAL | Status: AC
  Administered 2016-02-28: 650 mg via ORAL

## 2016-02-28 MED ORDER — HEPARIN SOD (PORK) LOCK FLUSH 100 UNIT/ML IV SOLN
500.0000 [IU] | Freq: Once | INTRAVENOUS | Status: AC | PRN
  Administered 2016-02-28: 500 [IU]
  Filled 2016-02-28: qty 5

## 2016-02-28 MED ORDER — TRASTUZUMAB CHEMO 150 MG IV SOLR
6.0000 mg/kg | Freq: Once | INTRAVENOUS | Status: AC
  Administered 2016-02-28: 609 mg via INTRAVENOUS
  Filled 2016-02-28: qty 29

## 2016-02-28 MED ORDER — SODIUM CHLORIDE 0.9 % IV SOLN
Freq: Once | INTRAVENOUS | Status: AC
  Administered 2016-02-28: 10:00:00 via INTRAVENOUS

## 2016-02-28 MED ORDER — SODIUM CHLORIDE 0.9 % IJ SOLN
10.0000 mL | INTRAMUSCULAR | Status: DC | PRN
  Administered 2016-02-28: 10 mL via INTRAVENOUS
  Filled 2016-02-28: qty 10

## 2016-02-28 MED ORDER — SODIUM CHLORIDE 0.9 % IJ SOLN
10.0000 mL | INTRAMUSCULAR | Status: DC | PRN
  Administered 2016-02-28: 10 mL
  Filled 2016-02-28: qty 10

## 2016-02-28 NOTE — Progress Notes (Signed)
[facility] Follow up note   Patient Care Team: Pcp Not In System as PCP - General [name], DO (Family Medicine) Holley Bouche, NP as Nurse Practitioner (Nurse Practitioner) Stark Klein, MD as Consulting Physician (General Surgery) Arloa Koh, MD as Consulting Physician (Radiation Oncology) [name], MD as Consulting Physician (Hematology)  CHIEF COMPLAINTS Follow up metastatic breast cancer  Oncology History   Breast cancer 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   Staging form: Breast, AJCC 7th Edition     Clinical stage from 07/22/2014: Stage IV (T3, N1, M1) - Unsigned       Breast cancer 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 ([place])   07/02/2014 Mammogram Mammogram showed a 2cm right beast mass and a 1.8cm right axillary node. 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 Bilateral pulmonary lesions with left hilar lymphadenopathy also markedly decreased in the interval. The left hilar lymphadenopathy has resolved.   12/20/2014 Imaging restaging CT showed stable disease, no new lesions    03/29/2015 Pathology Results  right breast lumpectomy showed  chemotherapy treatment effect,  a 1 mm residual tumor,   margins were widely negative, 5 sentinel lymph nodes and 2 axillary lymph  nodes were negative.    03/29/2015 Surgery  right breast lumpectomy and sentinel lymph node biopsy  by Dr. BBarry Dienes  04/12/2015 -  Chemotherapy  Herceptin maintenance therapy , 6 mg/kg, every 3 weeks   05/03/2015 - 06/14/2015 Radiation Therapy right breast adjuvant irradiation by Dr. MValere Dross    CURRENT THERAPY: Herceptin maintenance therapy every 3 weeks, started on 04/12/2015  INTERVAL HISTROY   NCashaereturns for follow-up and Herceptin treatment. She is doing well overall. She noticed some numbness and mild tingling of her fingers, no impact on her finger functions. She still has mild occasional nasal congestion and cough, which she contributes to allergies. Her prior chest pain in the right lateral chest wall has nearly resolved, she still has mild discomfort when she stretches, no other pain or complaints. She has good appetite, eating well, working full-time without difficulty. Her maternal aunt was diagnosed with breast cancer around same time as her, and has developed diffuse metastasis and was enrolled to hospice last week. Patient and her family has been under quite a bit of stress lately due to this. She has gained some weight lately.  MEDICAL HISTORY:  Past Medical History  Diagnosis Date  . Pneumonia     hx of pneumonia 08/2013   . GERD (gastroesophageal reflux disease)     during pregnancy   . S/P radiation therapy 05/03/2015 through 06/14/2015     Right breast 4680 cGy in 26 sessions, right breast boost 1000 cGy in 5 sessions. Right supraclavicular/axillary region 4680 cGy with a supplemental PA field to bring the axillary dose up to 4500 cGy in  26 sessions  . Breast cancer ([place])   . Breast cancer [facility])     SURGICAL HISTORY: Past Surgical History  Procedure Laterality Date  . Cesarean section    . Essure tubal ligation    . Cholecystectomy    . Wisdom tooth extraction    . Portacath placement Left 07/21/2014    Procedure:  INSERTION PORT-A-CATH;  Surgeon: Stark Klein, MD;  Location: [facility];  Service: General;  Laterality: Left;  . Breast lumpectomy with radioactive seed and sentinel lymph node biopsy Right 03/29/2015    Procedure: BREAST LUMPECTOMY WITH RADIOACTIVE SEED AND SENTINEL LYMPH NODE BIOPSY;  Surgeon: Stark Klein, MD;  Location: [facility];  Service: General;  Laterality: Right;    SOCIAL HISTORY: History   Social History  . Marital Status: Married    Spouse Name: N/A    Number of Children:  she has 2 daughters at the age of 60 and 22.   . Years of Education: N/A   Occupational History  .  works as Freight forwarder for a [employer].    Social History Main Topics  . Smoking status: Never Smoker   . Smokeless tobacco: Not on file  . Alcohol Use: Yes  . Drug Use: No  . Sexual Activity: No    FAMILY HISTORY: Family History  Problem Relation Age of Onset  . Breast cancer Mother 61  . Liver cancer Father 16  . Breast cancer Maternal Aunt 36  . Prostate cancer Maternal Grandfather   . Liver cancer Paternal Grandmother   . Prostate cancer Paternal Grandfather      GENETICS: 08/30/2014 BreastNext panel was negative. 17 genes including BRCA1, BRCA2, were negative for mutations.   ALLERGIES:  is allergic to adhesive; aspirin; and codeine.  MEDICATIONS:  Current Outpatient Prescriptions  Medication Sig Dispense Refill  . albuterol (PROVENTIL HFA;VENTOLIN HFA) 108 (90 Base) MCG/ACT inhaler Inhale 2 puffs into the lungs every 6 (six) hours as needed for wheezing or shortness of breath. 1 Inhaler 2  . Calcium-Phosphorus-Vitamin D (CALCIUM GUMMIES PO) Take by mouth daily.    . Camphor-Eucalyptus-Menthol (VICKS VAPORUB EX) Apply 1 application topically at bedtime. Applies under nose, throat and on chest.    . cetirizine (ZYRTEC) 10 MG tablet Take 10 mg by mouth daily as needed for allergies (allergies).     . chlorpheniramine-HYDROcodone (TUSSIONEX PENNKINETIC ER) 10-8 MG/5ML SUER  Take 5 mLs by mouth every 12 (twelve) hours as needed for cough. 115 mL 0  . clindamycin (CLINDAGEL) 1 % gel Apply topically to affected areas BID. 60 g 3  . fexofenadine (ALLEGRA) 180 MG tablet Take 180 mg by mouth daily as needed.  4  . fluticasone (FLONASE) 50 MCG/ACT nasal spray   1  . hydrocortisone 2.5 % cream APPLY TOPICALLY TO THE AFFECTED AREA TWICE DAILY 454 g 0  . lidocaine-prilocaine (EMLA) cream Apply topically as needed. 30 g 2  . loperamide (IMODIUM A-D) 2 MG tablet Take 1 tablet (2 mg total) by mouth 4 (four) times daily as needed for diarrhea or loose stools. 30 tablet 0  . metaxalone (SKELAXIN) 800 MG tablet Take 1 tablet (800 mg total) by mouth 3 (three) times daily. 30 tablet 0  . Multiple Vitamins-Minerals (MULTIVITAMIN GUMMIES ADULT PO) Take 1 each by mouth daily. Women's Vitafusion gummie    . ondansetron (ZOFRAN) 8 MG tablet Take 1 tablet (8 mg total) by mouth every 8 (eight) hours as needed for nausea or vomiting. 30 tablet 3  .  Pseudoeph-Doxylamine-DM-APAP (NYQUIL PO) Take 30 mLs by mouth 2 (two) times daily as needed (cold/allergies).      No current facility-administered medications for this visit.    REVIEW OF SYSTEMS:   Constitutional: Denies fevers, chills or abnormal night sweats Eyes: Denies blurriness of vision, double vision or watery eyes Ears, nose, mouth, throat, and face: Denies mucositis or sore throat Respiratory: Occasional dry cough, no dyspnea or wheezes Cardiovascular: Denies palpitation, chest discomfort or lower extremity swelling Gastrointestinal:  Denies nausea, heartburn or change in bowel habits Skin:(+)  skin pigmentation on bilateral forearms and left elbow, much improved Lymphatics: Denies new lymphadenopathy or easy bruising Neurological:Denies numbness, tingling or new weaknesses Behavioral/Psych: Mood is stable, no new changes  All other systems were reviewed with the patient and are negative.  PHYSICAL EXAMINATION: ECOG  PERFORMANCE STATUS: 0 BP 154/75 mmHg  Pulse 80  Temp(Src) 98.4 F (36.9 C) (Oral)  Resp 16  Ht 5' 10"  (1.778 m)  Wt 233 lb 11.2 oz (106.006 kg)  BMI 33.53 kg/m2  SpO2 99%  GENERAL:alert, no distress and comfortable SKIN: skin color, texture, turgor are normal, no rashes or significant lesions EYES: normal, conjunctiva are pink and non-injected, sclera clear OROPHARYNX:no exudate, no erythema and lips, buccal mucosa, and tongue normal  NECK: supple, thyroid normal size, non-tender, without nodularity LYMPH:  no palpable lymphadenopathy in the cervical, axillary or inguinal LUNGS: clear to auscultation and percussion with normal breathing effort HEART: regular rate & rhythm and no murmurs and no lower extremity edema ABDOMEN:abdomen soft, non-tender and normal bowel sounds Musculoskeletal:no cyanosis of digits and no clubbing, no tenderness on bilateral chest wall or spine PSYCH: alert & oriented x 3 with fluent speech NEURO: no focal motor/sensory deficits Breasts:  Status post right breast lumpectomy and sentinel lymph node biopsy, incisions have healed well. There is a small lump above her breast incision site, non-tender, corresponding to the seroma on the CT scan. (+) mild skin pigmentation of right breast secondary to radiation. Palpation of both breasts  and axillas revealed no other obvious mass that I could appreciate. Skin: No skin rashes.   LABORATORY DATA:  I have reviewed the data as listed CBC Latest Ref Rng 02/28/2016 02/07/2016 01/17/2016  WBC 3.9 - 10.3 10e3/uL 6.0 6.1 5.6  Hemoglobin 11.6 - 15.9 g/dL 12.3 11.5(L) 12.2  Hematocrit 34.8 - 46.6 % 36.9 35.1 36.8  Platelets 145 - 400 10e3/uL 227 208 234    CMP Latest Ref Rng 02/28/2016 02/07/2016 01/17/2016  Glucose 70 - 140 mg/dl 113 101 98  BUN 7.0 - 26.0 mg/dL 11.5 11.2 15.8  Creatinine 0.6 - 1.1 mg/dL 0.8 0.8 1.0  Sodium 136 - 145 mEq/L 139 139 141  Potassium 3.5 - 5.1 mEq/L 3.6 3.7 3.7  CO2 22 - 29 mEq/L 24 25 28    Calcium 8.4 - 10.4 mg/dL 9.2 9.2 9.7  Total Protein 6.4 - 8.3 g/dL 7.5 7.4 8.0  Total Bilirubin 0.20 - 1.20 mg/dL 0.63 0.35 <0.30  Alkaline Phos 40 - 150 U/L 76 71 77  AST 5 - 34 U/L 23 21 21   ALT 0 - 55 U/L 24 26 27     PATHOLOGY REPORT 07/09/2014 #1 breast, right needle core biopsy more anterior -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 45)  Lung, biopsy, Left 07/26/2014 - HIGH GRADE  CARCINOMA, SEE COMMENT. Microscopic Comment The carcinoma demonstrates the following immunophenotype: TTF-1 - negative expression. Napsin A- negative expression. CK5/6 - focal, moderate expression. estrogen receptor - negative expression. GCDFP- negative expression. The recent breast biopsy demonstrating Her2 amplified high grade invasive mammary carcinoma is noted ([id number]). Although the immunophenotype of the current case is non-sepcific, it strongly argues against primary lung adenocarcinoma. However, on re-review, the morphology of the current case is essentially identical to the primary mammary carcinoma. In lieu of further immunophenotyping, the tumor will be submitted for Her2 testing and the remaining tissue will be reserved for additional ancillary tumor testing. The results of the Her2 testing will be reported in an addendum. The case was discussed with Dr Burr Medico on 07/28/2015 and 07/29/2015 HER2 POSITIVE   Diagnosis 03/29/2015 1. Breast, lumpectomy, Right - INVASIVE DUCTAL CARCINOMA, SEE COMMENT. - SEE TUMOR SYNOPTIC TEMPLATE BELOW. 2. Lymph node, sentinel, biopsy, Right axillary #1 - ONE LYMPH NODE, NEGATIVE FOR TUMOR (0/1). - SEE COMMENT. 3. Lymph node, sentinel, biopsy, Right axillary #2 - BENIGN FIBROFATTY SOFT TISSUE. - NEGATIVE FOR ATYPIA OR MALIGNANCY. -  NEGATIVE FOR LYMPH NODE. 4. Lymph node, sentinel, biopsy, Right axillary #3 - ONE LYMPH NODE, NEGATIVE FOR TUMOR (0/1). - SEE COMMENT. 5. Lymph node, sentinel, biopsy, Right axillary #4 - ONE LYMPH NODE, NEGATIVE FOR TUMOR (0/1). - SEE COMMENT. 6. Lymph node, sentinel, biopsy, Right axillary #5 - ONE LYMPH NODE, NEGATIVE FOR TUMOR (0/1). - SEE COMMENT. 7. Lymph node, sentinel, biopsy, right axillary - ONE LYMPH NODE, NEGATIVE FOR TUMOR (0/1). - SEE COMMENT. 8. Lymph node, sentinel, biopsy, right axillary - ONE LYMPH NODE, NEGATIVE FOR TUMOR (0/1). - SEE COMMENT. 1 of 4 Amended copy Amended FINAL for MACARENA, LANGSETH ([id number]) Microscopic Comment 1. BREAST, INVASIVE TUMOR, WITH LYMPH NODES PRESENT Specimen, including laterality and lymph node sampling (sentinel, non-sentinel): Right breast with sentinel and non-sentinel lymph node sampling Procedure: Lumpectomy Histologic type: Ductal, see comment Grade: 2 of 3, see comment. Tubule formation: 3 Nuclear pleomorphism: 3 Mitotic: 1 Tumor size (glass slide measurement): 1 mm, see comment Margins: Invasive, distance to closest margin: See comment In-situ, distance to closest margin: N/A If margin positive, focally or broadly: N/A Lymphovascular invasion: Absent Ductal carcinoma in situ: Absent Grade: N/A Extensive intraductal component: N/A Lobular neoplasia: Absent Tumor focality: Unifocal, see comment Treatment effect: Present If present, treatment effect in breast tissue, lymph nodes or both: Both breast tissue and lymph nodes Extent of tumor: Skin: N/A Nipple: N/A Skeletal muscle: N/A Lymph nodes: Examined: 5 Sentinel 2 Non-sentinel 7 Total Lymph nodes with metastasis: 0 Isolated tumor cells (< 0.2 mm): N/A Micrometastasis: (> 0.2 mm and < 2.0 mm): N/A Macrometastasis: (> 2.0 mm): N/A Extracapsular extension: N/A Breast prognostic profile: Estrogen receptor: Not repeated, previous study demonstrated 0%  positivity ([id number]). Progesterone receptor: Not repeated, previous study demonstrated 0% positivity ([id number]) Her 2 neu: Demonstrates amplificaition ([id number]) Ki-67: Not repeated, previous study demonstrate 79% proliferation rate ([id number]). Non-neoplastic breast: Neoadjuvant related tissue changes and adenosis. TNM: ypT42m, ypN0, pMX Comments: Numerous representative sections including the 2.0 cm hemorrhagic tissue associated with X-shaped biopsy clip, the 2.0 cm ill defined lesion associated with M-shaped clip, and intervening tissue were submitted for review. Slide sections from the tissue surrounding the X-shaped and M-shaped clips demonstrate tissue changes consistent with neoadjuvant related change. There is no invasive or in situ carcinom present at either site. However, in a representative section from the intervening tissue (slide G), there is a 1 mm focus  of high grade invasive ductal carcinoma present. Given the minute size of the tumor, tumor grading is limited. The tumor is present within non-marginal tissue sections and is considered to be at least 0.5 cm from the nearest margin (medial).   Note: Amendment issued for modification of synoptic table, inadvertent typographical error. The change in the synoptic table was discussed with Dr Burr Medico on 04/07/2015. (CRR:ecj 04/07/2015) 2. , 4-8. In parts 2 and 4, there is extensive neoadjuvant related tissue changes including abundant foamy macrophages, fibroinflammatory reaction and dystrophic calcifications. The neoadjuvant tissue changes extend into perinodal soft tissue and are either focally or broadly present at the cauterized edge of the tissue submitted. There are no definitive features of malignancy present.  RADIOGRAPHIC STUDIES:  CT chest, abdomen and pelvis with contrast 02/23/2016 IMPRESSION: 1. Continued contraction of treated metastatic lesion in the left upper lobe indicative of positive response to  therapy. 2. No new signs of metastatic disease are noted elsewhere in the chest, abdomen or pelvis. 3. Small resolving postoperative seroma in the medial aspect of the right breast is decreased in size compared to the prior study. 4. Mild hepatic steatosis. 5. Additional incidental findings, as above.  Bone scan 02/23/2016 IMPRESSION: No definite evidence of osseous metastatic disease.  Subtle increased uptake in the posterior lateral aspect of the right eighth rib merits further evaluation with a right rib x-ray series.   ECHO 01/18/2016 Study Conclusions  - Left ventricle: The cavity size was normal. Wall thickness was  normal. Systolic function was normal. The estimated ejection  fraction was in the range of 60% to 65%. Wall motion was normal;  there were no regional wall motion abnormalities. Doppler  parameters are consistent with abnormal left ventricular  relaxation (grade 1 diastolic dysfunction). - Impressions: Lateral s&' 7.9 cm/sec, GLS -20.2%  Impressions:  - Lateral s&' 7.9 cm/sec, GLS -20.2%   ASSESSMENT & PLAN:  45 year old African-American female, premenopausal, without significant past medical history, presented with palpable right breast mass and right axillary mass.   1. 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 - I reviewed her breasts surgical pathology results with her in great detail ,  She has had fantastic partial response (near complete response) form 8 months of chemotherapy and antibody therapy.   The initial 6.3 cm breast mass has only 1 mm residual tumor,  And the previously 4.4 cm right axillary lymph node has had complete  Pathological response, all 7 nodes were negative for cancer. -I reviewed her restaging CT and bone  scans from 02/23/2016, the previous infiltrative changes in right upper/middle lobes have further improved, no other change or new lesions. There is subtle uptake in the right lateral 8th rib, which responds to her right lateral chest wall pain, but CT did not showed rib fracture or lesion  -She is clinically doing very well, labs are normal, no clinical concern for disease progression or recurrence. -continue herceptin indefinitely if no disease progression or significant side effects -Continue monitoring cardiac echo every 4 months, she will follow up with Dr. BHaroldine Laws  2. Right lateral chest pain -She developed some right lateral chest pain a few months ago, was positional, responded to muscle relaxant, its minimal now, possible muscular pain in nature. -Her recent bone scan didn't show subtle uptake in the lateral right  eighth rib, corresponding to her chest pain. However there is no significant bone lesion or fracture on the CT scan.  3. Mild peripheral neuropathy, G1 -Probably related to her prior chemotherapy -Very mild, we'll continue monitoring.    Plan for today  -Continue Herceptin every 3 weeks, treatment today. -RTC in 6 weeks for follow up  I spent about 20 minutes counseling the patient and her family members, total care was about 25 minutes.  [name]  02/28/2016

## 2016-02-28 NOTE — Patient Instructions (Signed)

## 2016-02-28 NOTE — Patient Instructions (Signed)
[facility] Discharge Instructions for Patients Receiving Chemotherapy  Today you received the following chemotherapy agents herceptin   To help prevent nausea and vomiting after your treatment, we encourage you to take your nausea medication as directed   If you develop nausea and vomiting that is not controlled by your nausea medication, call the clinic.   BELOW ARE SYMPTOMS THAT SHOULD BE REPORTED IMMEDIATELY:  *FEVER GREATER THAN 100.5 F  *CHILLS WITH OR WITHOUT FEVER  NAUSEA AND VOMITING THAT IS NOT CONTROLLED WITH YOUR NAUSEA MEDICATION  *UNUSUAL SHORTNESS OF BREATH  *UNUSUAL BRUISING OR BLEEDING  TENDERNESS IN MOUTH AND THROAT WITH OR WITHOUT PRESENCE OF ULCERS  *URINARY PROBLEMS  *BOWEL PROBLEMS  UNUSUAL RASH Items with * indicate a potential emergency and should be followed up as soon as possible.  Feel free to call the clinic you have any questions or concerns. The clinic phone number is [phone].  

## 2016-03-03 ENCOUNTER — Telehealth: Payer: Self-pay | Admitting: Hematology

## 2016-03-03 NOTE — Telephone Encounter (Signed)
Lvm advising next appt 8/8 @ 11.45 and asked pt to collect a calendar then.

## 2016-03-20 ENCOUNTER — Ambulatory Visit (HOSPITAL_BASED_OUTPATIENT_CLINIC_OR_DEPARTMENT_OTHER): Payer: 59

## 2016-03-20 ENCOUNTER — Other Ambulatory Visit (HOSPITAL_BASED_OUTPATIENT_CLINIC_OR_DEPARTMENT_OTHER): Payer: 59

## 2016-03-20 DIAGNOSIS — Z5112 Encounter for antineoplastic immunotherapy: Secondary | ICD-10-CM

## 2016-03-20 DIAGNOSIS — C50911 Malignant neoplasm of unspecified site of right female breast: Secondary | ICD-10-CM

## 2016-03-20 DIAGNOSIS — C7802 Secondary malignant neoplasm of left lung: Secondary | ICD-10-CM

## 2016-03-20 DIAGNOSIS — C7800 Secondary malignant neoplasm of unspecified lung: Principal | ICD-10-CM

## 2016-03-20 LAB — CBC WITH DIFFERENTIAL/PLATELET
BASO%: 0.4 % (ref 0.0–2.0)
Basophils Absolute: 0 10*3/uL (ref 0.0–0.1)
EOS%: 1.4 % (ref 0.0–7.0)
Eosinophils Absolute: 0.1 10*3/uL (ref 0.0–0.5)
HEMATOCRIT: 37.6 % (ref 34.8–46.6)
HEMOGLOBIN: 12.4 g/dL (ref 11.6–15.9)
LYMPH#: 2.1 10*3/uL (ref 0.9–3.3)
LYMPH%: 24.1 % (ref 14.0–49.7)
MCH: 29 pg (ref 25.1–34.0)
MCHC: 32.9 g/dL (ref 31.5–36.0)
MCV: 88.1 fL (ref 79.5–101.0)
MONO#: 0.9 10*3/uL (ref 0.1–0.9)
MONO%: 9.9 % (ref 0.0–14.0)
NEUT%: 64.2 % (ref 38.4–76.8)
NEUTROS ABS: 5.5 10*3/uL (ref 1.5–6.5)
Platelets: 248 10*3/uL (ref 145–400)
RBC: 4.27 10*6/uL (ref 3.70–5.45)
RDW: 14.5 % (ref 11.2–14.5)
WBC: 8.6 10*3/uL (ref 3.9–10.3)

## 2016-03-20 LAB — COMPREHENSIVE METABOLIC PANEL
ALBUMIN: 3.5 g/dL (ref 3.5–5.0)
ALT: 19 U/L (ref 0–55)
AST: 15 U/L (ref 5–34)
Alkaline Phosphatase: 70 U/L (ref 40–150)
Anion Gap: 8 mEq/L (ref 3–11)
BUN: 17.4 mg/dL (ref 7.0–26.0)
CALCIUM: 9.3 mg/dL (ref 8.4–10.4)
CHLORIDE: 106 meq/L (ref 98–109)
CO2: 26 mEq/L (ref 22–29)
CREATININE: 1 mg/dL (ref 0.6–1.1)
EGFR: 76 mL/min/{1.73_m2} — ABNORMAL LOW (ref >90)
Glucose: 90 mg/dl (ref 70–140)
Potassium: 4 mEq/L (ref 3.5–5.1)
Sodium: 140 mEq/L (ref 136–145)
TOTAL PROTEIN: 7.5 g/dL (ref 6.4–8.3)
Total Bilirubin: 0.37 mg/dL (ref 0.20–1.20)

## 2016-03-20 MED ORDER — SODIUM CHLORIDE 0.9 % IJ SOLN
10.0000 mL | INTRAMUSCULAR | Status: DC | PRN
  Administered 2016-03-20: 10 mL
  Filled 2016-03-20: qty 10

## 2016-03-20 MED ORDER — SODIUM CHLORIDE 0.9 % IV SOLN
Freq: Once | INTRAVENOUS | Status: AC
  Administered 2016-03-20: 13:00:00 via INTRAVENOUS

## 2016-03-20 MED ORDER — SODIUM CHLORIDE 0.9 % IV SOLN
6.0000 mg/kg | Freq: Once | INTRAVENOUS | Status: AC
  Administered 2016-03-20: 609 mg via INTRAVENOUS
  Filled 2016-03-20: qty 29

## 2016-03-20 MED ORDER — HEPARIN SOD (PORK) LOCK FLUSH 100 UNIT/ML IV SOLN
500.0000 [IU] | Freq: Once | INTRAVENOUS | Status: AC | PRN
  Administered 2016-03-20: 500 [IU]
  Filled 2016-03-20: qty 5

## 2016-03-20 MED ORDER — ACETAMINOPHEN 325 MG PO TABS: 650.0000 mg | ORAL_TABLET | Freq: Once | ORAL | Status: DC

## 2016-03-20 NOTE — Patient Instructions (Signed)
[facility] Discharge Instructions for Patients Receiving Chemotherapy  Today you received the following chemotherapy agents Herceptin  To help prevent nausea and vomiting after your treatment, we encourage you to take your nausea medication    If you develop nausea and vomiting that is not controlled by your nausea medication, call the clinic.   BELOW ARE SYMPTOMS THAT SHOULD BE REPORTED IMMEDIATELY:  *FEVER GREATER THAN 100.5 F  *CHILLS WITH OR WITHOUT FEVER  NAUSEA AND VOMITING THAT IS NOT CONTROLLED WITH YOUR NAUSEA MEDICATION  *UNUSUAL SHORTNESS OF BREATH  *UNUSUAL BRUISING OR BLEEDING  TENDERNESS IN MOUTH AND THROAT WITH OR WITHOUT PRESENCE OF ULCERS  *URINARY PROBLEMS  *BOWEL PROBLEMS  UNUSUAL RASH Items with * indicate a potential emergency and should be followed up as soon as possible.  Feel free to call the clinic you have any questions or concerns. The clinic phone number is [phone].  Please show the CHEMO ALERT CARD at check-in to the Emergency Department and triage nurse.   

## 2016-04-05 ENCOUNTER — Ambulatory Visit (HOSPITAL_BASED_OUTPATIENT_CLINIC_OR_DEPARTMENT_OTHER): Payer: 59 | Admitting: Oncology

## 2016-04-05 ENCOUNTER — Telehealth: Payer: Self-pay | Admitting: *Deleted

## 2016-04-05 VITALS — BP 159/85 | HR 91 | Temp 98.7°F | Resp 18 | Ht 70.0 in | Wt 234.7 lb

## 2016-04-05 DIAGNOSIS — L309 Dermatitis, unspecified: Secondary | ICD-10-CM | POA: Insufficient documentation

## 2016-04-05 DIAGNOSIS — C50919 Malignant neoplasm of unspecified site of unspecified female breast: Secondary | ICD-10-CM

## 2016-04-05 DIAGNOSIS — B3789 Other sites of candidiasis: Secondary | ICD-10-CM | POA: Insufficient documentation

## 2016-04-05 DIAGNOSIS — R21 Rash and other nonspecific skin eruption: Secondary | ICD-10-CM | POA: Diagnosis not present

## 2016-04-05 DIAGNOSIS — C773 Secondary and unspecified malignant neoplasm of axilla and upper limb lymph nodes: Secondary | ICD-10-CM

## 2016-04-05 DIAGNOSIS — C7800 Secondary malignant neoplasm of unspecified lung: Principal | ICD-10-CM

## 2016-04-05 DIAGNOSIS — C50911 Malignant neoplasm of unspecified site of right female breast: Secondary | ICD-10-CM

## 2016-04-05 MED ORDER — FLUCONAZOLE 100 MG PO TABS: 100.0000 mg | ORAL_TABLET | Freq: Every day | ORAL | 0 refills | Status: DC

## 2016-04-05 MED ORDER — TRIAMCINOLONE ACETONIDE 0.025 % EX OINT: 1.0000 "application " | TOPICAL_OINTMENT | Freq: Two times a day (BID) | CUTANEOUS | 0 refills | Status: DC

## 2016-04-05 NOTE — Telephone Encounter (Signed)
Patient called and left message that she has a discharge/moisture under her right breast and it has an odor.  Also her eczema is worse, which usually happens when she has other health problems that are worsening.  Called patient and she will come in and have this assessed with Mikey Bussing NP with sx management.  Patient appreciated the call back and ability to be seen today.

## 2016-04-05 NOTE — Progress Notes (Signed)
SYMPTOM MANAGEMENT CLINIC    Chief Complaint: Rash to breast  HPI:  [name] [name] 45 y.o. female diagnosed with breast cancer metastasized to the lung. She presents today for evaluation of a rash to her right breast. Has noticed an odor to the rash. Has been present since she went to the beach a few weeks ago. States her breast has been moist due to the heat. Has been trying to keep this area dry as much as possible. Has used Vick's to the area which makes it cooler, but rash has not resolved. Worried that the rash could be related to her breast cancer. Also states that her eczema has flared. Using Hydrocortisone OTC, but Triamcinolone has worked better in the past. Denies fevers, chills, chest pain, nausea, vomiting, changes to her breasts other than the rash.   Oncology History   Breast cancer 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   Staging form: Breast, AJCC 7th Edition     Clinical stage from 07/22/2014: Stage IV (T3, N1, M1) - Unsigned       Breast cancer 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 ([place])   07/02/2014 Mammogram    Mammogram showed a 2cm right beast mass and a 1.8cm right axillary node. MRI breast on 07/16/2014 showed 7cm R breast lesion and 4.4cm r axillary node       07/02/2014 Imaging    CT CAP: a 4.7cm mass in LUL lung and a 2.1cm mas in RML, and a small nodule in RUL, suspecious for metastasis        07/09/2014 Initial Diagnosis    right IDA with b/l lung lesions, ER-/PR-/HER2+      07/09/2014 Initial Biopsy    US guided right breast mass and axillary node biopsy showed IDA, and DCIS, ER-/PR-/HER2+      07/26/2014 Pathologic Stage    Left lung mass by IR, path revealed high grade carcinoma, morphology similar to breast tumor biopsy, TTF(-), NapsinA(-), ER(-)      08/04/2014 - 03/22/2015 Chemotherapy    weekly Paclitaxel 42m/m2, trastuzumab and pertuzumab every 3 weeks      10/04/2014 Imaging    Interval decrease in the right axillary lymphadenopathy. Bilateral pulmonary lesions with  left hilar lymphadenopathy also markedly decreased in the interval. The left hilar lymphadenopathy has resolved.      12/20/2014 Imaging    restaging CT showed stable disease, no new lesions       03/29/2015 Pathology Results     right breast lumpectomy showed  chemotherapy treatment effect,  a 1 mm residual tumor,   margins were widely negative, 5 sentinel lymph nodes and 2 axillary lymph nodes were negative.       03/29/2015 Surgery     right breast lumpectomy and sentinel lymph node biopsy  by Dr. BBarry Dienes     04/12/2015 -  Chemotherapy     Herceptin maintenance therapy , 6 mg/kg, every 3 weeks      05/03/2015 - 06/14/2015 Radiation Therapy    right breast adjuvant irradiation by Dr. MValere Dross       Review of Systems  Constitutional: Negative.   HENT: Negative.   Eyes: Negative.   Respiratory: Negative.   Cardiovascular: Negative.   Gastrointestinal: Negative.   Genitourinary: Negative.   Musculoskeletal: Negative.   Skin: Positive for itching and rash.  Neurological: Negative.   Endo/Heme/Allergies: Negative.   Psychiatric/Behavioral: Negative.     Past Medical History:  Diagnosis Date  . [place]   . [place]   .  GERD (gastroesophageal reflux disease)    during pregnancy   . Pneumonia    hx of pneumonia 08/2013   . S/P radiation therapy 05/03/2015 through 06/14/2015    Right breast 4680 cGy in 26 sessions, right breast boost 1000 cGy in 5 sessions. Right supraclavicular/axillary region 4680 cGy with a supplemental PA field to bring the axillary dose up to 4500 cGy in 26 sessions    Past Surgical History:  Procedure Laterality Date  . BREAST LUMPECTOMY WITH RADIOACTIVE SEED AND SENTINEL LYMPH NODE BIOPSY Right 03/29/2015   Procedure: BREAST LUMPECTOMY WITH RADIOACTIVE SEED AND SENTINEL LYMPH NODE BIOPSY;  Surgeon: Stark Klein, MD;  Location: [facility];  Service: General;   Laterality: Right;  . CESAREAN SECTION    . CHOLECYSTECTOMY    . ESSURE TUBAL LIGATION    . PORTACATH PLACEMENT Left 07/21/2014   Procedure: INSERTION PORT-A-CATH;  Surgeon: Stark Klein, MD;  Location: [facility];  Service: General;  Laterality: Left;  . WISDOM TOOTH EXTRACTION      has Breast cancer 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 [facility]); Family history of breast cancer; Lung mas bilaterial; Anemia in neoplastic disease; Peripheral neuropathy due to chemotherapy ([place]); Seasonal allergies; Port catheter in place; Candidiasis of breast; and Eczema on her problem list.    is allergic to adhesive [tape]; aspirin; and codeine.    Medication List       Accurate as of 04/05/16  7:41 PM. Always use your most recent med list.          albuterol 108 (90 Base) MCG/ACT inhaler Commonly known as:  PROVENTIL HFA;VENTOLIN HFA Inhale 2 puffs into the lungs every 6 (six) hours as needed for wheezing or shortness of breath.   CALCIUM GUMMIES PO Take by mouth daily.   cetirizine 10 MG tablet Commonly known as:  ZYRTEC Take 10 mg by mouth daily as needed for allergies (allergies).   chlorpheniramine-HYDROcodone 10-8 MG/5ML Suer Commonly known as:  TUSSIONEX PENNKINETIC ER Take 5 mLs by mouth every 12 (twelve) hours as needed for cough.   clindamycin 1 % gel Commonly known as:  CLINDAGEL Apply topically to affected areas BID.   fexofenadine 180 MG tablet Commonly known as:  ALLEGRA Take 180 mg by mouth daily as needed.   fluconazole 100 MG tablet Commonly known as:  DIFLUCAN Take 1 tablet (100 mg total) by mouth daily.   fluticasone 50 MCG/ACT nasal spray Commonly known as:  FLONASE   hydrocortisone 2.5 % cream APPLY TOPICALLY TO THE AFFECTED AREA TWICE DAILY   lidocaine-prilocaine cream Commonly known as:  EMLA Apply topically as needed.   loperamide 2 MG tablet Commonly known as:  IMODIUM A-D Take 1 tablet (2 mg total) by mouth 4 (four) times daily as needed for diarrhea or loose stools.     metaxalone 800 MG tablet Commonly known as:  SKELAXIN Take 1 tablet (800 mg total) by mouth 3 (three) times daily.   MULTIVITAMIN GUMMIES ADULT PO Take 1 each by mouth daily. Women's Vitafusion gummie   NYQUIL PO Take 30 mLs by mouth 2 (two) times daily as needed (cold/allergies).   ondansetron 8 MG tablet Commonly known as:  ZOFRAN Take 1 tablet (8 mg total) by mouth every 8 (eight) hours as needed for nausea or vomiting.   triamcinolone 0.025 % ointment Commonly known as:  KENALOG Apply 1 application topically 2 (two) times daily.   VICKS VAPORUB EX Apply 1 application topically at bedtime. Applies under nose, throat and on chest.  PHYSICAL EXAMINATION  Oncology Vitals 04/05/2016 02/28/2016  Height 178 cm 178 cm  Weight 106.459 kg 106.006 kg  Weight (lbs) 234 lbs 11 oz 233 lbs 11 oz  BMI (kg/m2) 33.68 kg/m2 33.53 kg/m2  Temp 98.7 98.4  Pulse 91 80  Resp 18 16  SpO2 99 99  BSA (m2) 2.29 m2 2.29 m2   BP Readings from Last 2 Encounters:  04/05/16 (!) 159/85  02/28/16 (!) 154/75    Physical Exam  Constitutional: She is oriented to person, place, and time and well-developed, well-nourished, and in no distress.  HENT:  Head: Normocephalic and atraumatic.  Mouth/Throat: Oropharynx is clear and moist.  Neck: Normal range of motion. Neck supple.  Cardiovascular: Normal rate, regular rhythm and normal heart sounds.   Pulmonary/Chest: Effort normal and breath sounds normal.  Abdominal: Soft. Bowel sounds are normal.  Musculoskeletal: Normal range of motion. She exhibits no edema.  Lymphadenopathy:    She has no cervical adenopathy.  Neurological: She is alert and oriented to person, place, and time. Gait normal.  Skin: Skin is warm and dry. Rash noted.  Rash to right breast and hands.  Psychiatric: Mood, memory, affect and judgment normal.    LABORATORY DATA:. No visits with results within 3 Day(s) from this visit.  Latest known visit with results is:   Appointment on 03/20/2016  Component Date Value Ref Range Status  . WBC 03/20/2016 8.6  3.9 - 10.3 10e3/uL Final  . NEUT# 03/20/2016 5.5  1.5 - 6.5 10e3/uL Final  . HGB 03/20/2016 12.4  11.6 - 15.9 g/dL Final  . HCT 03/20/2016 37.6  34.8 - 46.6 % Final  . Platelets 03/20/2016 248  145 - 400 10e3/uL Final  . MCV 03/20/2016 88.1  79.5 - 101.0 fL Final  . MCH 03/20/2016 29.0  25.1 - 34.0 pg Final  . MCHC 03/20/2016 32.9  31.5 - 36.0 g/dL Final  . RBC 03/20/2016 4.27  3.70 - 5.45 10e6/uL Final  . RDW 03/20/2016 14.5  11.2 - 14.5 % Final  . lymph# 03/20/2016 2.1  0.9 - 3.3 10e3/uL Final  . MONO# 03/20/2016 0.9  0.1 - 0.9 10e3/uL Final  . Eosinophils Absolute 03/20/2016 0.1  0.0 - 0.5 10e3/uL Final  . Basophils Absolute 03/20/2016 0.0  0.0 - 0.1 10e3/uL Final  . NEUT% 03/20/2016 64.2  38.4 - 76.8 % Final  . LYMPH% 03/20/2016 24.1  14.0 - 49.7 % Final  . MONO% 03/20/2016 9.9  0.0 - 14.0 % Final  . EOS% 03/20/2016 1.4  0.0 - 7.0 % Final  . BASO% 03/20/2016 0.4  0.0 - 2.0 % Final  . Sodium 03/20/2016 140  136 - 145 mEq/L Final  . Potassium 03/20/2016 4.0  3.5 - 5.1 mEq/L Final  . Chloride 03/20/2016 106  98 - 109 mEq/L Final  . CO2 03/20/2016 26  22 - 29 mEq/L Final  . Glucose 03/20/2016 90  70 - 140 mg/dl Final  . BUN 03/20/2016 17.4  7.0 - 26.0 mg/dL Final  . Creatinine 03/20/2016 1.0  0.6 - 1.1 mg/dL Final  . Total Bilirubin 03/20/2016 0.37  0.20 - 1.20 mg/dL Final  . Alkaline Phosphatase 03/20/2016 70  40 - 150 U/L Final  . AST 03/20/2016 15  5 - 34 U/L Final  . ALT 03/20/2016 19  0 - 55 U/L Final  . Total Protein 03/20/2016 7.5  6.4 - 8.3 g/dL Final  . Albumin 03/20/2016 3.5  3.5 - 5.0 g/dL Final  . Calcium 03/20/2016 9.3  8.4 - 10.4 mg/dL Final  . Anion Gap 03/20/2016 8  3 - 11 mEq/L Final  . EGFR 03/20/2016 76* >90 ml/min/1.73 m2 Final    RADIOGRAPHIC STUDIES: No results found.  ASSESSMENT/PLAN:    No problem-specific Assessment & Plan notes found for this encounter.  Ms.  Jeremiah is a 45 year old female with breast cancer with metastasis to the lung. Here today for concerns of a rash to her breast and flare of her eczema. Rash to breast is consistent with candidiasis. Discussed keeping this area as dry as possible. Treatment options discussed including use of topical ointment vs. Oral fluconazole. The patient opts for fluconazole. Prescription sent to local pharmacy. I reviewed the CT results from 02/2016 and tried to reassure her that this rash is unrelated to cancer progression.  For her eczema, she may use Trimacinolone cream. I sent a prescription to her pharmacy for this as well.   Patient stated understanding of all instructions; and was in agreement with this plan of care. The patient knows to call the clinic with any problems, questions or concerns.   Total time spent with patient was 30 minutes;  with greater than 50 percent of that time spent in face to face counseling regarding patient's symptoms,  and coordination of care and follow up.  Mikey Bussing, NP 04/05/2016

## 2016-04-10 ENCOUNTER — Encounter: Payer: Self-pay | Admitting: Hematology

## 2016-04-10 ENCOUNTER — Telehealth: Payer: Self-pay | Admitting: Hematology

## 2016-04-10 ENCOUNTER — Ambulatory Visit: Payer: 59

## 2016-04-10 ENCOUNTER — Ambulatory Visit (HOSPITAL_BASED_OUTPATIENT_CLINIC_OR_DEPARTMENT_OTHER): Payer: 59 | Admitting: Hematology

## 2016-04-10 ENCOUNTER — Other Ambulatory Visit (HOSPITAL_BASED_OUTPATIENT_CLINIC_OR_DEPARTMENT_OTHER): Payer: 59

## 2016-04-10 ENCOUNTER — Ambulatory Visit (HOSPITAL_BASED_OUTPATIENT_CLINIC_OR_DEPARTMENT_OTHER): Payer: 59

## 2016-04-10 VITALS — BP 155/81 | HR 80 | Temp 98.8°F | Resp 18 | Ht 70.0 in | Wt 237.7 lb

## 2016-04-10 DIAGNOSIS — C7800 Secondary malignant neoplasm of unspecified lung: Secondary | ICD-10-CM

## 2016-04-10 DIAGNOSIS — J019 Acute sinusitis, unspecified: Secondary | ICD-10-CM | POA: Diagnosis not present

## 2016-04-10 DIAGNOSIS — C50911 Malignant neoplasm of unspecified site of right female breast: Secondary | ICD-10-CM

## 2016-04-10 DIAGNOSIS — Z171 Estrogen receptor negative status [ER-]: Secondary | ICD-10-CM | POA: Diagnosis not present

## 2016-04-10 DIAGNOSIS — Z5112 Encounter for antineoplastic immunotherapy: Secondary | ICD-10-CM

## 2016-04-10 DIAGNOSIS — Z95828 Presence of other vascular implants and grafts: Secondary | ICD-10-CM

## 2016-04-10 DIAGNOSIS — C50919 Malignant neoplasm of unspecified site of unspecified female breast: Secondary | ICD-10-CM

## 2016-04-10 LAB — CBC WITH DIFFERENTIAL/PLATELET
BASO%: 0.5 % (ref 0.0–2.0)
Basophils Absolute: 0 10*3/uL (ref 0.0–0.1)
EOS%: 3.4 % (ref 0.0–7.0)
Eosinophils Absolute: 0.2 10*3/uL (ref 0.0–0.5)
HCT: 37.2 % (ref 34.8–46.6)
HGB: 12.3 g/dL (ref 11.6–15.9)
LYMPH#: 1.4 10*3/uL (ref 0.9–3.3)
LYMPH%: 28.9 % (ref 14.0–49.7)
MCH: 28.6 pg (ref 25.1–34.0)
MCHC: 33 g/dL (ref 31.5–36.0)
MCV: 86.5 fL (ref 79.5–101.0)
MONO#: 0.4 10*3/uL (ref 0.1–0.9)
MONO%: 7.8 % (ref 0.0–14.0)
NEUT%: 59.4 % (ref 38.4–76.8)
NEUTROS ABS: 3 10*3/uL (ref 1.5–6.5)
PLATELETS: 210 10*3/uL (ref 145–400)
RBC: 4.3 10*6/uL (ref 3.70–5.45)
RDW: 13.6 % (ref 11.2–14.5)
WBC: 5 10*3/uL (ref 3.9–10.3)

## 2016-04-10 LAB — COMPREHENSIVE METABOLIC PANEL
ALBUMIN: 3.4 g/dL — AB (ref 3.5–5.0)
ALK PHOS: 80 U/L (ref 40–150)
ALT: 26 U/L (ref 0–55)
ANION GAP: 9 meq/L (ref 3–11)
AST: 20 U/L (ref 5–34)
BILIRUBIN TOTAL: 0.37 mg/dL (ref 0.20–1.20)
BUN: 13.2 mg/dL (ref 7.0–26.0)
CALCIUM: 9.4 mg/dL (ref 8.4–10.4)
CO2: 25 mEq/L (ref 22–29)
CREATININE: 0.9 mg/dL (ref 0.6–1.1)
Chloride: 107 mEq/L (ref 98–109)
Glucose: 125 mg/dl (ref 70–140)
Potassium: 3.9 mEq/L (ref 3.5–5.1)
Sodium: 141 mEq/L (ref 136–145)
TOTAL PROTEIN: 7.5 g/dL (ref 6.4–8.3)

## 2016-04-10 MED ORDER — SODIUM CHLORIDE 0.9 % IJ SOLN
10.0000 mL | INTRAMUSCULAR | Status: DC | PRN
  Administered 2016-04-10: 10 mL
  Filled 2016-04-10: qty 10

## 2016-04-10 MED ORDER — NYSTATIN 100000 UNIT/GM EX POWD: Freq: Two times a day (BID) | CUTANEOUS | 2 refills | Status: DC

## 2016-04-10 MED ORDER — SODIUM CHLORIDE 0.9 % IV SOLN
Freq: Once | INTRAVENOUS | Status: AC
  Administered 2016-04-10: 11:00:00 via INTRAVENOUS

## 2016-04-10 MED ORDER — ACETAMINOPHEN 325 MG PO TABS: 650.0000 mg | ORAL_TABLET | Freq: Once | ORAL | Status: DC

## 2016-04-10 MED ORDER — HEPARIN SOD (PORK) LOCK FLUSH 100 UNIT/ML IV SOLN
500.0000 [IU] | Freq: Once | INTRAVENOUS | Status: AC | PRN
  Administered 2016-04-10: 500 [IU]
  Filled 2016-04-10: qty 5

## 2016-04-10 MED ORDER — SODIUM CHLORIDE 0.9 % IJ SOLN
10.0000 mL | INTRAMUSCULAR | Status: DC | PRN
  Administered 2016-04-10: 10 mL via INTRAVENOUS
  Filled 2016-04-10: qty 10

## 2016-04-10 MED ORDER — TRASTUZUMAB CHEMO 150 MG IV SOLR
6.0000 mg/kg | Freq: Once | INTRAVENOUS | Status: AC
  Administered 2016-04-10: 609 mg via INTRAVENOUS
  Filled 2016-04-10: qty 29

## 2016-04-10 NOTE — Telephone Encounter (Signed)
Gave patient appointments for September and October. Central radiology will call re ct - patient aware. Patient declined printout.

## 2016-04-10 NOTE — Patient Instructions (Signed)
[facility] Discharge Instructions for Patients Receiving Chemotherapy  Today you received the following chemotherapy agents Herceptin  To help prevent nausea and vomiting after your treatment, we encourage you to take your nausea medication    If you develop nausea and vomiting that is not controlled by your nausea medication, call the clinic.   BELOW ARE SYMPTOMS THAT SHOULD BE REPORTED IMMEDIATELY:  *FEVER GREATER THAN 100.5 F  *CHILLS WITH OR WITHOUT FEVER  NAUSEA AND VOMITING THAT IS NOT CONTROLLED WITH YOUR NAUSEA MEDICATION  *UNUSUAL SHORTNESS OF BREATH  *UNUSUAL BRUISING OR BLEEDING  TENDERNESS IN MOUTH AND THROAT WITH OR WITHOUT PRESENCE OF ULCERS  *URINARY PROBLEMS  *BOWEL PROBLEMS  UNUSUAL RASH Items with * indicate a potential emergency and should be followed up as soon as possible.  Feel free to call the clinic you have any questions or concerns. The clinic phone number is [phone].  Please show the CHEMO ALERT CARD at check-in to the Emergency Department and triage nurse.   

## 2016-04-10 NOTE — Progress Notes (Signed)
[name]ology Follow up note   Patient Care Team: Pcp Not In System as PCP - General [name], DO (Family Medicine) Holley Bouche, NP as Nurse Practitioner (Nurse Practitioner) Stark Klein, MD as Consulting Physician (General Surgery) Arloa Koh, MD as Consulting Physician (Radiation Oncology) [name], MD as Consulting Physician (Hematology)  CHIEF COMPLAINTS Follow up metastatic breast cancer  Oncology History   Breast cancer 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   Staging form: Breast, AJCC 7th Edition     Clinical stage from 07/22/2014: Stage IV (T3, N1, M1) - Unsigned       Breast cancer 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 ([place])   07/02/2014 Mammogram    Mammogram showed a 2cm right beast mass and a 1.8cm right axillary node. MRI breast on 07/16/2014 showed 7cm R breast lesion and 4.4cm r axillary node       07/02/2014 Imaging    CT CAP: a 4.7cm mass in LUL lung and a 2.1cm mas in RML, and a small nodule in RUL, suspecious for metastasis        07/09/2014 Initial Diagnosis    right IDA with b/l lung lesions, ER-/PR-/HER2+      07/09/2014 Initial Biopsy    US guided right breast mass and axillary node biopsy showed IDA, and DCIS, ER-/PR-/HER2+      07/26/2014 Pathologic Stage    Left lung mass by IR, path revealed high grade carcinoma, morphology similar to breast tumor biopsy, TTF(-), NapsinA(-), ER(-)      08/04/2014 - 03/22/2015 Chemotherapy    weekly Paclitaxel '80mg'$ /m2, trastuzumab and pertuzumab every 3 weeks      10/04/2014 Imaging    Interval decrease in the right axillary lymphadenopathy. Bilateral pulmonary lesions with left hilar lymphadenopathy also markedly decreased in the interval. The left hilar lymphadenopathy has resolved.      12/20/2014 Imaging    restaging CT showed stable disease, no new lesions       03/29/2015 Pathology Results     right breast lumpectomy showed  chemotherapy treatment effect,  a 1 mm residual tumor,    margins were widely negative, 5 sentinel lymph nodes and 2 axillary lymph nodes were negative.       03/29/2015 Surgery     right breast lumpectomy and sentinel lymph node biopsy  by Dr. Barry Dienes      04/12/2015 -  Chemotherapy     Herceptin maintenance therapy , 6 mg/kg, every 3 weeks      05/03/2015 - 06/14/2015 Radiation Therapy    right breast adjuvant irradiation by Dr. Valere Dross        CURRENT THERAPY: Herceptin maintenance therapy every 3 weeks, started on 04/12/2015  INTERVAL HISTROY   [name] [name] returns for follow-up and Herceptin treatment. She developed skin fungal infection below the right breast during her trip a few weeks ago, and was seen by our APP last week. She was started on fluconazole, and skin rash has much improved. She also has nasal congestion and sinus infection lately, no fever or chills. Nasal discharge has been clear. She otherwise feels well, has good appetite and energy level.  MEDICAL HISTORY:  Past Medical History:  Diagnosis Date  . Breast cancer ([place])   . Breast cancer ([place])   . GERD (gastroesophageal reflux disease)    during pregnancy   . Pneumonia    hx of pneumonia 08/2013   . S/P radiation therapy 05/03/2015 through 06/14/2015    Right breast 4680 cGy in 26 sessions, right breast  boost 1000 cGy in 5 sessions. Right supraclavicular/axillary region 4680 cGy with a supplemental PA field to bring the axillary dose up to 4500 cGy in 26 sessions    SURGICAL HISTORY: Past Surgical History:  Procedure Laterality Date  . BREAST LUMPECTOMY WITH RADIOACTIVE SEED AND SENTINEL LYMPH NODE BIOPSY Right 03/29/2015   Procedure: BREAST LUMPECTOMY WITH RADIOACTIVE SEED AND SENTINEL LYMPH NODE BIOPSY;  Surgeon: Stark Klein, MD;  Location: [facility];  Service: General;  Laterality: Right;  . CESAREAN SECTION    . CHOLECYSTECTOMY    . ESSURE TUBAL LIGATION    . PORTACATH PLACEMENT Left 07/21/2014    Procedure: INSERTION PORT-A-CATH;  Surgeon: Stark Klein, MD;  Location: [facility];  Service: General;  Laterality: Left;  . WISDOM TOOTH EXTRACTION      SOCIAL HISTORY: History   Social History  . Marital Status: Married    Spouse Name: N/A    Number of Children:  she has 2 daughters at the age of 31 and 74.   . Years of Education: N/A   Occupational History  .  works as Freight forwarder for a [employer].    Social History Main Topics  . Smoking status: Never Smoker   . Smokeless tobacco: Not on file  . Alcohol Use: Yes  . Drug Use: No  . Sexual Activity: No    FAMILY HISTORY: Family History  Problem Relation Age of Onset  . Breast cancer Mother 34  . Liver cancer Father 40  . Breast cancer Maternal Aunt 36  . Prostate cancer Maternal Grandfather   . Liver cancer Paternal Grandmother   . Prostate cancer Paternal Grandfather      GENETICS: 08/30/2014 BreastNext panel was negative. 17 genes including BRCA1, BRCA2, were negative for mutations.   ALLERGIES:  is allergic to adhesive [tape]; aspirin; and codeine.  MEDICATIONS:  Current Outpatient Prescriptions  Medication Sig Dispense Refill  . albuterol (PROVENTIL HFA;VENTOLIN HFA) 108 (90 Base) MCG/ACT inhaler Inhale 2 puffs into the lungs every 6 (six) hours as needed for wheezing or shortness of breath. 1 Inhaler 2  . Calcium-Phosphorus-Vitamin D (CALCIUM GUMMIES PO) Take by mouth daily.    . Camphor-Eucalyptus-Menthol (VICKS VAPORUB EX) Apply 1 application topically at bedtime. Applies under nose, throat and on chest.    . cetirizine (ZYRTEC) 10 MG tablet Take 10 mg by mouth daily as needed for allergies (allergies).     . chlorpheniramine-HYDROcodone (TUSSIONEX PENNKINETIC ER) 10-8 MG/5ML SUER Take 5 mLs by mouth every 12 (twelve) hours as needed for cough. (Patient not taking: Reported on 04/05/2016) 115 mL 0  . clindamycin (CLINDAGEL) 1 % gel Apply topically to affected areas BID. 60 g 3  . fexofenadine (ALLEGRA) 180 MG  tablet Take 180 mg by mouth daily as needed.  4  . fluconazole (DIFLUCAN) 100 MG tablet Take 1 tablet (100 mg total) by mouth daily. 7 tablet 0  . fluticasone (FLONASE) 50 MCG/ACT nasal spray   1  . hydrocortisone 2.5 % cream APPLY TOPICALLY TO THE AFFECTED AREA TWICE DAILY 454 g 0  . lidocaine-prilocaine (EMLA) cream Apply topically as needed. 30 g 2  . loperamide (IMODIUM A-D) 2 MG tablet Take 1 tablet (2 mg total) by mouth 4 (four) times daily as needed for diarrhea or loose stools. 30 tablet 0  . metaxalone (SKELAXIN) 800 MG tablet Take 1 tablet (800 mg total) by mouth 3 (three) times daily. 30 tablet 0  . Multiple Vitamins-Minerals (MULTIVITAMIN GUMMIES ADULT PO) Take  1 each by mouth daily. Women's Vitafusion gummie    . nystatin (NYSTATIN) powder Apply topically 2 (two) times daily. 15 g 2  . ondansetron (ZOFRAN) 8 MG tablet Take 1 tablet (8 mg total) by mouth every 8 (eight) hours as needed for nausea or vomiting. 30 tablet 3  . Pseudoeph-Doxylamine-DM-APAP (NYQUIL PO) Take 30 mLs by mouth 2 (two) times daily as needed (cold/allergies).     . triamcinolone (KENALOG) 0.025 % ointment Apply 1 application topically 2 (two) times daily. 30 g 0   No current facility-administered medications for this visit.     REVIEW OF SYSTEMS:   Constitutional: Denies fevers, chills or abnormal night sweats Eyes: Denies blurriness of vision, double vision or watery eyes Ears, nose, mouth, throat, and face: Denies mucositis or sore throat Respiratory: Occasional dry cough, no dyspnea or wheezes Cardiovascular: Denies palpitation, chest discomfort or lower extremity swelling Gastrointestinal:  Denies nausea, heartburn or change in bowel habits Skin:(+)  skin pigmentation on bilateral forearms and left elbow, much improved Lymphatics: Denies new lymphadenopathy or easy bruising Neurological:Denies numbness, tingling or new weaknesses Behavioral/Psych: Mood is stable, no new changes  All other systems were  reviewed with the patient and are negative.  PHYSICAL EXAMINATION: ECOG PERFORMANCE STATUS: 0 BP (!) 155/81 (BP Location: Left Arm, Patient Position: Sitting)   Pulse 80   Temp 98.8 F (37.1 C) (Oral)   Resp 18   Ht _0  (1.778 m)   Wt 237 lb 11.2 oz (107.8 kg)   SpO2 100%   BMI 34.11 kg/m   GENERAL:alert, no distress and comfortable SKIN: skin color, texture, turgor are normal, no rashes or significant lesions EYES: normal, conjunctiva are pink and non-injected, sclera clear OROPHARYNX:no exudate, no erythema and lips, buccal mucosa, and tongue normal  NECK: supple, thyroid normal size, non-tender, without nodularity LYMPH:  no palpable lymphadenopathy in the cervical, axillary or inguinal LUNGS: clear to auscultation and percussion with normal breathing effort HEART: regular rate & rhythm and no murmurs and no lower extremity edema ABDOMEN:abdomen soft, non-tender and normal bowel sounds Musculoskeletal:no cyanosis of digits and no clubbing, no tenderness on bilateral chest wall or spine PSYCH: alert & oriented x 3 with fluent speech NEURO: no focal motor/sensory deficits Breasts:  Status post right breast lumpectomy and sentinel lymph node biopsy, incisions have healed well. The previous palpable seroma has resolved. (+) mild skin pigmentation of right breast secondary to radiation. The skin peeling underneath the right breast has healed well. Palpation of both breasts  and axillas revealed no other obvious mass that I could appreciate. Skin: No skin rashes.   LABORATORY DATA:  I have reviewed the data as listed CBC Latest Ref Rng & Units 04/10/2016 03/20/2016 02/28/2016  WBC 3.9 - 10.3 10e3/uL 5.0 8.6 6.0  Hemoglobin 11.6 - 15.9 g/dL 12.3 12.4 12.3  Hematocrit 34.8 - 46.6 % 37.2 37.6 36.9  Platelets 145 - 400 10e3/uL 210 248 227    CMP Latest Ref Rng & Units 04/10/2016 03/20/2016 02/28/2016  Glucose 70 - 140 mg/dl 125 90 113  BUN 7.0 - 26.0 mg/dL 13.2 17.4 11.5  Creatinine 0.6  - 1.1 mg/dL 0.9 1.0 0.8  Sodium 136 - 145 mEq/L 141 140 139  Potassium 3.5 - 5.1 mEq/L 3.9 4.0 3.6  Chloride 101 - 111 mmol/L - - -  CO2 22 - 29 mEq/L _1 Calcium 8.4 - 10.4 mg/dL 9.4 9.3 9.2  Total Protein 6.4 - 8.3 g/dL 7.5 7.5 7.5  Total Bilirubin 0.20 - 1.20 mg/dL 0.37 0.37 0.63  Alkaline Phos 40 - 150 U/L 80 70 76  AST 5 - 34 U/L _0 ALT 0 - 55 U/L _1 PATHOLOGY REPORT 07/09/2014 #1 breast, right needle core biopsy more anterior -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 45)  Lung, biopsy, Left 07/26/2014 - HIGH GRADE CARCINOMA, SEE COMMENT. Microscopic Comment The carcinoma demonstrates the following immunophenotype: TTF-1 - negative expression. Napsin A- negative expression. CK5/6 - focal, moderate expression. estrogen receptor - negative expression. GCDFP- negative expression. The recent breast biopsy demonstrating Her2 amplified high grade invasive mammary carcinoma is noted ([id number]). Although the immunophenotype of the current case is non-sepcific, it strongly argues against primary lung adenocarcinoma. However, on re-review, the morphology of the current case is essentially identical to the primary mammary carcinoma. In lieu of further immunophenotyping, the tumor will be submitted for Her2 testing and the remaining tissue will be reserved for additional ancillary tumor testing. The results of the Her2 testing will be reported in an addendum. The case was discussed with Dr Burr Medico on 07/28/2015 and 07/29/2015 HER2 POSITIVE   Diagnosis 03/29/2015 1. Breast, lumpectomy, Right - INVASIVE DUCTAL CARCINOMA, SEE COMMENT. - SEE TUMOR SYNOPTIC TEMPLATE BELOW. 2. Lymph node, sentinel, biopsy, Right axillary #1 - ONE LYMPH NODE,  NEGATIVE FOR TUMOR (0/1). - SEE COMMENT. 3. Lymph node, sentinel, biopsy, Right axillary #2 - BENIGN FIBROFATTY SOFT TISSUE. - NEGATIVE FOR ATYPIA OR MALIGNANCY. - NEGATIVE FOR LYMPH NODE. 4. Lymph node, sentinel, biopsy, Right axillary #3 - ONE LYMPH NODE, NEGATIVE FOR TUMOR (0/1). - SEE COMMENT. 5. Lymph node, sentinel, biopsy, Right axillary #4 - ONE LYMPH NODE, NEGATIVE FOR TUMOR (0/1). - SEE COMMENT. 6. Lymph node, sentinel, biopsy, Right axillary #5 - ONE LYMPH NODE, NEGATIVE FOR TUMOR (0/1). - SEE COMMENT. 7. Lymph node, sentinel, biopsy, right axillary - ONE LYMPH NODE, NEGATIVE FOR TUMOR (0/1). - SEE COMMENT. 8. Lymph node, sentinel, biopsy, right axillary - ONE LYMPH NODE, NEGATIVE FOR TUMOR (0/1). - SEE COMMENT. 1 of 4 Amended copy Amended FINAL for JACQUALYNN, PARCO ([id number]) Microscopic Comment 1. BREAST, INVASIVE TUMOR, WITH LYMPH NODES PRESENT Specimen, including laterality and lymph node sampling (sentinel, non-sentinel): Right breast with sentinel and non-sentinel lymph node sampling Procedure: Lumpectomy Histologic type: Ductal, see comment Grade: 2 of 3, see comment. Tubule formation: 3 Nuclear pleomorphism: 3 Mitotic: 1 Tumor size (glass slide measurement): 1 mm, see comment Margins: Invasive, distance to closest margin: See comment In-situ, distance to closest margin: N/A If margin positive, focally or broadly: N/A Lymphovascular invasion: Absent Ductal carcinoma in situ: Absent Grade: N/A Extensive intraductal component: N/A Lobular neoplasia: Absent Tumor focality: Unifocal, see comment Treatment effect: Present If present, treatment effect in breast tissue, lymph nodes or both: Both breast tissue and lymph nodes Extent of tumor: Skin: N/A Nipple: N/A Skeletal muscle: N/A Lymph nodes: Examined: 5 Sentinel 2 Non-sentinel 7 Total Lymph nodes with metastasis: 0 Isolated tumor cells (< 0.2 mm): N/A Micrometastasis: (> 0.2 mm and < 2.0  mm): N/A Macrometastasis: (> 2.0 mm): N/A Extracapsular extension: N/A Breast prognostic profile: Estrogen receptor: Not repeated, previous study demonstrated 0% positivity ([id number]). Progesterone receptor: Not repeated, previous study demonstrated 0% positivity ([id number]) Her 2 neu: Demonstrates amplificaition ([id number]) Ki-67: Not repeated, previous  study demonstrate 79% proliferation rate ([id number]). Non-neoplastic breast: Neoadjuvant related tissue changes and adenosis. TNM: ypT73m, ypN0, pMX Comments: Numerous representative sections including the 2.0 cm hemorrhagic tissue associated with X-shaped biopsy clip, the 2.0 cm ill defined lesion associated with M-shaped clip, and intervening tissue were submitted for review. Slide sections from the tissue surrounding the X-shaped and M-shaped clips demonstrate tissue changes consistent with neoadjuvant related change. There is no invasive or in situ carcinom present at either site. However, in a representative section from the intervening tissue (slide G), there is a 1 mm focus of high grade invasive ductal carcinoma present. Given the minute size of the tumor, tumor grading is limited. The tumor is present within non-marginal tissue sections and is considered to be at least 0.5 cm from the nearest margin (medial).   Note: Amendment issued for modification of synoptic table, inadvertent typographical error. The change in the synoptic table was discussed with Dr FBurr Medicoon 04/07/2015. (CRR:ecj 04/07/2015) 2. , 4-8. In parts 2 and 4, there is extensive neoadjuvant related tissue changes including abundant foamy macrophages, fibroinflammatory reaction and dystrophic calcifications. The neoadjuvant tissue changes extend into perinodal soft tissue and are either focally or broadly present at the cauterized edge of the tissue submitted. There are no definitive features of malignancy present.  RADIOGRAPHIC STUDIES:  CT chest, abdomen and  pelvis with contrast 02/23/2016 IMPRESSION: 1. Continued contraction of treated metastatic lesion in the left upper lobe indicative of positive response to therapy. 2. No new signs of metastatic disease are noted elsewhere in the chest, abdomen or pelvis. 3. Small resolving postoperative seroma in the medial aspect of the right breast is decreased in size compared to the prior study. 4. Mild hepatic steatosis. 5. Additional incidental findings, as above.  Bone scan 02/23/2016 IMPRESSION: No definite evidence of osseous metastatic disease.  Subtle increased uptake in the posterior lateral aspect of the right eighth rib merits further evaluation with a right rib x-ray series.   ECHO 01/18/2016 Study Conclusions  - Left ventricle: The cavity size was normal. Wall thickness was  normal. Systolic function was normal. The estimated ejection  fraction was in the range of 60% to 65%. Wall motion was normal;  there were no regional wall motion abnormalities. Doppler  parameters are consistent with abnormal left ventricular  relaxation (grade 1 diastolic dysfunction). - Impressions: Lateral s&' 7.9 cm/sec, GLS -20.2%  Impressions:  - Lateral s&' 7.9 cm/sec, GLS -20.2%   ASSESSMENT & PLAN:  45year old African-American female, premenopausal, without significant past medical history, presented with palpable right breast mass and right axillary mass.   1. 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 - I reviewed her breasts surgical pathology results with her in great detail ,  She has had fantastic partial response (near complete response) form 8 months of chemotherapy and antibody therapy.   The initial 6.3 cm breast mass has only 1 mm residual tumor,  And the  previously 4.4 cm right axillary lymph node has had complete  Pathological response, all 7 nodes were negative for cancer. -I reviewed her restaging CT and bone scans from 02/23/2016, the previous infiltrative changes in right upper/middle lobes have further improved, no other change or new lesions. There is subtle uptake in the right lateral 8th rib, which responds to her right lateral chest  wall pain, but CT did not showed rib fracture or lesion  -She is clinically doing very well, labs are normal, no clinical concern for disease progression or recurrence. -continue herceptin indefinitely if no disease progression or significant side effects -Continue monitoring cardiac echo every 4 months, she will follow up with Dr. Haroldine Laws   2. Right lateral chest pain -near resolved now, likely muscular pain. -Her recent bone scan showed subtle uptake in the lateral right eighth rib, corresponding to her chest pain. However there is no significant bone lesion or fracture on the CT scan.  3. Mild peripheral neuropathy, G1 -Probably related to her prior chemotherapy -Very mild, we'll continue monitoring.   4. Sinusitis -no thick nasal discharge, no fever or tenderness -She'll continue use antiallergy medication, no antibiotics for now   5. Skin fungal infection underneath right breast  -she is finishing fluconazole, skin rashes has much improved -I encouraged her to use cornstarch to keep the skin fold. Dry -I called in nystatin powder for her to use as needed in the future   Plan for today  -Continue Herceptin every 3 weeks, treatment today. -RTC in 6 weeks for follow up with restaging CT a few days before   I spent about 20 minutes counseling the patient and her family members, total care was about 25 minutes.  [name]  04/10/2016

## 2016-04-27 ENCOUNTER — Telehealth (HOSPITAL_COMMUNITY): Payer: Self-pay | Admitting: Vascular Surgery

## 2016-04-27 NOTE — Telephone Encounter (Signed)
Left pt message to call back to make f/u appt w/ echo in Oct

## 2016-05-01 ENCOUNTER — Other Ambulatory Visit (HOSPITAL_BASED_OUTPATIENT_CLINIC_OR_DEPARTMENT_OTHER): Payer: 59

## 2016-05-01 ENCOUNTER — Ambulatory Visit: Payer: 59

## 2016-05-01 ENCOUNTER — Ambulatory Visit (HOSPITAL_BASED_OUTPATIENT_CLINIC_OR_DEPARTMENT_OTHER): Payer: 59

## 2016-05-01 VITALS — BP 150/83 | HR 74 | Temp 98.3°F | Resp 18

## 2016-05-01 DIAGNOSIS — C7800 Secondary malignant neoplasm of unspecified lung: Secondary | ICD-10-CM

## 2016-05-01 DIAGNOSIS — C50911 Malignant neoplasm of unspecified site of right female breast: Secondary | ICD-10-CM

## 2016-05-01 DIAGNOSIS — Z5112 Encounter for antineoplastic immunotherapy: Secondary | ICD-10-CM | POA: Diagnosis not present

## 2016-05-01 DIAGNOSIS — Z95828 Presence of other vascular implants and grafts: Secondary | ICD-10-CM

## 2016-05-01 LAB — COMPREHENSIVE METABOLIC PANEL
ALT: 27 U/L (ref 0–55)
ANION GAP: 9 meq/L (ref 3–11)
AST: 20 U/L (ref 5–34)
Albumin: 3.5 g/dL (ref 3.5–5.0)
Alkaline Phosphatase: 90 U/L (ref 40–150)
BUN: 14.2 mg/dL (ref 7.0–26.0)
CHLORIDE: 105 meq/L (ref 98–109)
CO2: 26 meq/L (ref 22–29)
CREATININE: 0.8 mg/dL (ref 0.6–1.1)
Calcium: 9.3 mg/dL (ref 8.4–10.4)
EGFR: >90 mL/min/{1.73_m2} (ref >90)
GLUCOSE: 92 mg/dL (ref 70–140)
Potassium: 3.9 mEq/L (ref 3.5–5.1)
SODIUM: 140 meq/L (ref 136–145)
TOTAL PROTEIN: 7.9 g/dL (ref 6.4–8.3)
Total Bilirubin: 0.35 mg/dL (ref 0.20–1.20)

## 2016-05-01 LAB — CBC WITH DIFFERENTIAL/PLATELET
BASO%: 0.6 % (ref 0.0–2.0)
Basophils Absolute: 0 10*3/uL (ref 0.0–0.1)
EOS%: 2.5 % (ref 0.0–7.0)
Eosinophils Absolute: 0.2 10*3/uL (ref 0.0–0.5)
HCT: 36.5 % (ref 34.8–46.6)
HGB: 12.1 g/dL (ref 11.6–15.9)
LYMPH%: 29.1 % (ref 14.0–49.7)
MCH: 29 pg (ref 25.1–34.0)
MCHC: 33.1 g/dL (ref 31.5–36.0)
MCV: 87.7 fL (ref 79.5–101.0)
MONO#: 0.5 10*3/uL (ref 0.1–0.9)
MONO%: 8.8 % (ref 0.0–14.0)
NEUT%: 59 % (ref 38.4–76.8)
NEUTROS ABS: 3.6 10*3/uL (ref 1.5–6.5)
Platelets: 223 10*3/uL (ref 145–400)
RBC: 4.16 10*6/uL (ref 3.70–5.45)
RDW: 13.2 % (ref 11.2–14.5)
WBC: 6.1 10*3/uL (ref 3.9–10.3)
lymph#: 1.8 10*3/uL (ref 0.9–3.3)

## 2016-05-01 MED ORDER — SODIUM CHLORIDE 0.9 % IV SOLN
Freq: Once | INTRAVENOUS | Status: AC
  Administered 2016-05-01: 15:00:00 via INTRAVENOUS

## 2016-05-01 MED ORDER — HEPARIN SOD (PORK) LOCK FLUSH 100 UNIT/ML IV SOLN
500.0000 [IU] | Freq: Once | INTRAVENOUS | Status: AC | PRN
  Administered 2016-05-01: 500 [IU]
  Filled 2016-05-01: qty 5

## 2016-05-01 MED ORDER — SODIUM CHLORIDE 0.9 % IJ SOLN
10.0000 mL | INTRAMUSCULAR | Status: DC | PRN
  Administered 2016-05-01: 10 mL
  Filled 2016-05-01: qty 10

## 2016-05-01 MED ORDER — SODIUM CHLORIDE 0.9 % IJ SOLN
10.0000 mL | INTRAMUSCULAR | Status: DC | PRN
  Administered 2016-05-01: 10 mL via INTRAVENOUS
  Filled 2016-05-01: qty 10

## 2016-05-01 MED ORDER — ACETAMINOPHEN 325 MG PO TABS: 650.0000 mg | ORAL_TABLET | Freq: Once | ORAL | Status: DC

## 2016-05-01 MED ORDER — TRASTUZUMAB CHEMO INJECTION 440 MG
6.0000 mg/kg | Freq: Once | INTRAVENOUS | Status: AC
  Administered 2016-05-01: 609 mg via INTRAVENOUS
  Filled 2016-05-01: qty 29

## 2016-05-01 NOTE — Patient Instructions (Signed)

## 2016-05-01 NOTE — Patient Instructions (Signed)
[facility] Discharge Instructions for Patients Receiving Chemotherapy  Today you received the following chemotherapy agents Herceptin  To help prevent nausea and vomiting after your treatment, we encourage you to take your nausea medication    If you develop nausea and vomiting that is not controlled by your nausea medication, call the clinic.   BELOW ARE SYMPTOMS THAT SHOULD BE REPORTED IMMEDIATELY:  *FEVER GREATER THAN 100.5 F  *CHILLS WITH OR WITHOUT FEVER  NAUSEA AND VOMITING THAT IS NOT CONTROLLED WITH YOUR NAUSEA MEDICATION  *UNUSUAL SHORTNESS OF BREATH  *UNUSUAL BRUISING OR BLEEDING  TENDERNESS IN MOUTH AND THROAT WITH OR WITHOUT PRESENCE OF ULCERS  *URINARY PROBLEMS  *BOWEL PROBLEMS  UNUSUAL RASH Items with * indicate a potential emergency and should be followed up as soon as possible.  Feel free to call the clinic you have any questions or concerns. The clinic phone number is [phone].  Please show the CHEMO ALERT CARD at check-in to the Emergency Department and triage nurse.   

## 2016-05-03 ENCOUNTER — Other Ambulatory Visit: Payer: Self-pay | Admitting: Hematology

## 2016-05-11 ENCOUNTER — Emergency Department (HOSPITAL_BASED_OUTPATIENT_CLINIC_OR_DEPARTMENT_OTHER)
Admission: EM | Admit: 2016-05-11 | Discharge: 2016-05-11 | Disposition: A | Discharge status: Home / Self Care | Payer: 59 | Attending: Emergency Medicine | Admitting: Emergency Medicine

## 2016-05-11 ENCOUNTER — Encounter (HOSPITAL_BASED_OUTPATIENT_CLINIC_OR_DEPARTMENT_OTHER): Payer: Self-pay | Admitting: *Deleted

## 2016-05-11 ENCOUNTER — Emergency Department (HOSPITAL_BASED_OUTPATIENT_CLINIC_OR_DEPARTMENT_OTHER): Payer: 59

## 2016-05-11 DIAGNOSIS — Z853 Personal history of malignant neoplasm of breast: Secondary | ICD-10-CM | POA: Insufficient documentation

## 2016-05-11 DIAGNOSIS — J069 Acute upper respiratory infection, unspecified: Secondary | ICD-10-CM | POA: Insufficient documentation

## 2016-05-11 DIAGNOSIS — R0789 Other chest pain: Secondary | ICD-10-CM | POA: Diagnosis present

## 2016-05-11 MED ORDER — HYDROCOD POLST-CPM POLST ER 10-8 MG/5ML PO SUER: 5.0000 mL | Freq: Two times a day (BID) | ORAL | 0 refills | Status: DC | PRN

## 2016-05-11 MED ORDER — PREDNISONE 10 MG (21) PO TBPK: 10.0000 mg | ORAL_TABLET | Freq: Every day | ORAL | 0 refills | Status: DC

## 2016-05-11 NOTE — Discharge Instructions (Signed)
Medications: Prednisone, Tussionex  Treatment: Take prednisone as prescribed for 12 days. Take Tussionex as prescribed for your cough. You can continue taking your Allegra as prescribed.  Follow-up: Please follow-up with your doctor if your symptoms are not improving. Please return to emergency department if you develop any new or worsening symptoms such as fever, shortness of breath, or any other concerning symptoms.

## 2016-05-11 NOTE — ED Provider Notes (Signed)
[facility] Provider Note   CSN: [id number] Arrival date & time: 05/11/16  1654  By signing my name below, I, Estanislado Pandy, attest that this documentation has been prepared under the direction and in the presence of Eliezer Mccoy, PA-C. Electronically Signed: Estanislado Pandy, Scribe. 05/11/2016. 5:22 PM.    History   Chief Complaint Chief Complaint  Patient presents with  . URI    The history is provided by the patient. No language interpreter was used.   HPI Comments:  [name] [name] is a 45 y.o. female with PMHx of pneumonia and breast cancer who presents to the Emergency Department complaining of cough, postnasal drip x5 days. Pt complains of a non-productive cough with associated chest tightness and headache while coughing.  Pt states that she recently moved her seat at work and now sits under an air vent which she believes to be the cause of the symptoms. Her symptoms began with a sore throat, however this has resolved. Pt states that now she feels better that she did a few hours ago, however symptoms continue. Pt took allegra, nyquil with no relief. Pt denies pain with breathing, abdominal pain, nausea, vomiting, urinary symptoms. Patient is a breast cancer patient and is regularly treated with prednisone for upper respiratory infections and bronchitis to prevent them from becoming pneumonia.  Past Medical History:  Diagnosis Date  . Breast cancer ([place])   . Breast cancer ([place])   . GERD (gastroesophageal reflux disease)    during pregnancy   . Pneumonia    hx of pneumonia 08/2013   . S/P radiation therapy 05/03/2015 through 06/14/2015    Right breast 4680 cGy in 26 sessions, right breast boost 1000 cGy in 5 sessions. Right supraclavicular/axillary region 4680 cGy with a supplemental PA field to bring the axillary dose up to 4500 cGy in 26 sessions    Patient Active Problem List   Diagnosis Date Noted  .  Candidiasis of breast 04/05/2016  . Eczema 04/05/2016  . Port catheter in place 12/06/2015  . Seasonal allergies 10/04/2015  . 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 Breast cancer 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 ([place]) 07/22/2014  . Family history of breast cancer 07/22/2014  . 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 BREAST LUMPECTOMY WITH RADIOACTIVE SEED AND SENTINEL LYMPH NODE BIOPSY Right 03/29/2015   Procedure: BREAST LUMPECTOMY WITH RADIOACTIVE SEED AND SENTINEL LYMPH NODE BIOPSY;  Surgeon: Stark Klein, MD;  Location: [facility] [facility];  Service: General;  Laterality: Right;  . CESAREAN SECTION    . CHOLECYSTECTOMY    . ESSURE TUBAL LIGATION    . PORTACATH PLACEMENT Left 07/21/2014   Procedure: INSERTION PORT-A-CATH;  Surgeon: Stark Klein, MD;  Location: [facility];  Service: General;  Laterality: Left;  . WISDOM TOOTH EXTRACTION      OB History    Gravida Para Term Preterm AB Living   2 2           SAB TAB Ectopic Multiple Live Births                  Obstetric Comments   Menarche age 29, BC x 29, G2, P2, last menstrual cycle 08/15/14       Home Medications    Prior to Admission medications   Medication Sig Start Date End Date Taking? Authorizing Provider  albuterol (PROVENTIL HFA;VENTOLIN HFA) 108 (90 Base) MCG/ACT inhaler  Inhale 2 puffs into the lungs every 6 (six) hours as needed for wheezing or shortness of breath. 09/13/15   [name], MD  Calcium-Phosphorus-Vitamin D (CALCIUM GUMMIES PO) Take by mouth daily.    Historical Provider, MD  Camphor-Eucalyptus-Menthol (VICKS VAPORUB EX) Apply 1 application topically at bedtime. Applies under nose, throat and on chest.    Historical Provider, MD  cetirizine (ZYRTEC) 10 MG tablet Take 10 mg by mouth daily as needed for allergies (allergies).     Historical Provider, MD  chlorpheniramine-HYDROcodone (TUSSIONEX  PENNKINETIC ER) 10-8 MG/5ML SUER Take 5 mLs by mouth every 12 (twelve) hours as needed for cough. 05/11/16   Frederica Kuster, PA-C  clindamycin (CLINDAGEL) 1 % gel Apply topically to affected areas BID. 11/16/14   [name], MD  fexofenadine (ALLEGRA) 180 MG tablet Take 180 mg by mouth daily as needed. 12/13/15   Historical Provider, MD  fluconazole (DIFLUCAN) 100 MG tablet Take 1 tablet (100 mg total) by mouth daily. 04/05/16   Maryanna Shape, NP  fluticasone (FLONASE) 50 MCG/ACT nasal spray  08/03/15   Historical Provider, MD  hydrocortisone 2.5 % cream APPLY TOPICALLY TO THE AFFECTED AREA TWICE DAILY 12/07/14   [name], MD  lidocaine-prilocaine (EMLA) cream Apply topically as needed. 04/12/15   [name], MD  loperamide (IMODIUM A-D) 2 MG tablet Take 1 tablet (2 mg total) by mouth 4 (four) times daily as needed for diarrhea or loose stools. 09/21/14   [name], MD  metaxalone (SKELAXIN) 800 MG tablet Take 1 tablet (800 mg total) by mouth 3 (three) times daily. 12/27/15   [name], MD  Multiple Vitamins-Minerals (MULTIVITAMIN GUMMIES ADULT PO) Take 1 each by mouth daily. Women's Vitafusion gummie    Historical Provider, MD  nystatin (NYSTATIN) powder Apply topically 2 (two) times daily. 04/10/16   [name], MD  ondansetron (ZOFRAN) 8 MG tablet Take 1 tablet (8 mg total) by mouth every 8 (eight) hours as needed for nausea or vomiting. 07/28/14   [name], MD  predniSONE (STERAPRED UNI-PAK 21 TAB) 10 MG (21) TBPK tablet Take 1 tablet (10 mg total) by mouth daily. Take 6 tabs by mouth daily  for 2 days, then 5 tabs for 2 days, then 4 tabs for 2 days, then 3 tabs for 2 days, 2 tabs for 2 days, then 1 tab by mouth daily for 2 days 05/11/16   Bea Graff Andrianna Manalang, PA-C  Pseudoeph-Doxylamine-DM-APAP (NYQUIL PO) Take 30 mLs by mouth 2 (two) times daily as needed (cold/allergies).     Historical Provider, MD  triamcinolone (KENALOG) 0.025 % ointment Apply 1 application topically 2 (two) times daily. 04/05/16   Maryanna Shape, NP    Family History Family History  Problem Relation Age of Onset  . Breast cancer Mother 68  . Liver cancer Father   . Breast cancer Maternal Aunt 30  . Breast cancer Maternal Aunt 11  . Prostate cancer Maternal Grandfather 15  . Liver cancer Paternal Grandmother   . Prostate cancer Paternal Grandfather   . Prostate cancer Maternal Uncle 73  . Prostate cancer Maternal Uncle 68  . Breast cancer Cousin 47    mat first cousin    Social History Social History  Substance Use Topics  . Smoking status: Never Smoker  . Smokeless tobacco: Never Used  . Alcohol use Yes     Comment: 2-3 drinks per week      Allergies   Adhesive [tape]; Aspirin; and Codeine  Review of Systems Review of Systems  Constitutional: Negative for chills and fever.  HENT: Positive for sore throat. Negative for ear pain and facial swelling.   Respiratory: Positive for cough and chest tightness. Negative for shortness of breath.   Cardiovascular: Negative for chest pain.  Gastrointestinal: Negative for abdominal pain, nausea and vomiting.  Genitourinary: Negative for dysuria.  Musculoskeletal: Negative for back pain.  Skin: Negative for rash and wound.  Neurological: Negative for headaches.  Psychiatric/Behavioral: The patient is not nervous/anxious.      Physical Exam Updated Vital Signs BP 168/96   Pulse 87   Temp 98.6 F (37 C)   Resp 16   Ht '5\' 10"'$  (1.778 m)   Wt 107 kg   SpO2 100%   BMI 33.86 kg/m   Physical Exam  Constitutional: She appears well-developed and well-nourished. No distress.  HENT:  Head: Normocephalic and atraumatic.  Mouth/Throat: Oropharynx is clear and moist. No oropharyngeal exudate.  Eyes: Conjunctivae are normal. Pupils are equal, round, and reactive to light. Right eye exhibits no discharge. Left eye exhibits no discharge. No scleral icterus.  Neck: Normal range of motion. Neck supple. No thyromegaly present.  Cardiovascular: Normal rate, regular  rhythm, normal heart sounds and intact distal pulses.  Exam reveals no gallop and no friction rub.   No murmur heard. Pulmonary/Chest: Effort normal and breath sounds normal. No stridor. No respiratory distress. She has no wheezes. She has no rales.  Abdominal: Soft. Bowel sounds are normal. She exhibits no distension. There is no tenderness. There is no rebound and no guarding.  Musculoskeletal: She exhibits no edema.  Lymphadenopathy:    She has no cervical adenopathy.  Neurological: She is alert. Coordination normal.  Skin: Skin is warm and dry. No rash noted. She is not diaphoretic. No pallor.  Psychiatric: She has a normal mood and affect.  Nursing note and vitals reviewed.    ED Treatments / Results  DIAGNOSTIC STUDIES:  Oxygen Saturation is 100% on RA, normal by my interpretation.    COORDINATION OF CARE:  5:17 PM Discussed treatment plan with pt at bedside and pt agreed to plan.   Labs (all labs ordered are listed, but only abnormal results are displayed) Labs Reviewed - No data to display  EKG  EKG Interpretation None       Radiology Dg Chest 2 View  Result Date: 05/11/2016 CLINICAL DATA:  Cough for 5 days.  Breast cancer. EXAM: CHEST  2 VIEW COMPARISON:  08/13/2015 chest radiograph. FINDINGS: Left subclavian Port-A-Cath terminates in the lower third of the superior vena cava. Surgical clips overlie the right breast and right axilla. Stable cardiomediastinal silhouette with normal heart size and aortic atherosclerosis. No pneumothorax. No pleural effusion. No pulmonary edema. No acute consolidative airspace disease. Cholecystectomy clips are seen in the right upper quadrant of the abdomen. IMPRESSION: No active cardiopulmonary disease. Aortic atherosclerosis. Electronically Signed   By: Ilona Sorrel M.D.   On: 05/11/2016 17:45    Procedures Procedures (including critical care time)  Medications Ordered in ED Medications - No data to display   Initial  Impression / Assessment and Plan / ED Course  I have reviewed the triage vital signs and the nursing notes.  Pertinent labs & imaging results that were available during my care of the patient were reviewed by me and considered in my medical decision making (see chart for details).  Clinical Course    Patient with most likely upper respiratory infection. CXR shows no active cardiopulmonary  disease. Will treat patient with prednisone taper per history of breast cancer, pneumonia, past treatment by oncologist for respiratory conditions with his own. Also discharged patient with Tussionex, which she has used in the past with good relief. Patient to follow-up at her scheduled appointment with her oncologist in 1.5 weeks. Return precautions discussed. Patient will appearing. Patient vitals stable throughout ED course and discharged in satisfactory condition.  Final Clinical Impressions(s) / ED Diagnoses   Final diagnoses:  URI (upper respiratory infection)    New Prescriptions New Prescriptions   PREDNISONE (STERAPRED UNI-PAK 21 TAB) 10 MG (21) TBPK TABLET    Take 1 tablet (10 mg total) by mouth daily. Take 6 tabs by mouth daily  for 2 days, then 5 tabs for 2 days, then 4 tabs for 2 days, then 3 tabs for 2 days, 2 tabs for 2 days, then 1 tab by mouth daily for 2 days  I personally performed the services described in this documentation, which was scribed in my presence. The recorded information has been reviewed and is accurate.     Frederica Kuster, PA-C 05/11/16 1812    Gareth Morgan, MD 05/12/16 [phone]

## 2016-05-11 NOTE — ED Triage Notes (Signed)
Pt c/o URi symptoms x 5 days

## 2016-05-16 ENCOUNTER — Other Ambulatory Visit: Payer: Self-pay | Admitting: Hematology

## 2016-05-16 DIAGNOSIS — C50911 Malignant neoplasm of unspecified site of right female breast: Secondary | ICD-10-CM

## 2016-05-17 ENCOUNTER — Ambulatory Visit (HOSPITAL_COMMUNITY)
Admission: RE | Admit: 2016-05-17 | Discharge: 2016-05-17 | Disposition: A | Discharge status: Home / Self Care | Payer: 59 | Source: Ambulatory Visit | Attending: Hematology | Admitting: Hematology

## 2016-05-17 ENCOUNTER — Encounter (HOSPITAL_COMMUNITY): Payer: Self-pay

## 2016-05-17 ENCOUNTER — Ambulatory Visit (HOSPITAL_COMMUNITY): Payer: 59

## 2016-05-17 DIAGNOSIS — R918 Other nonspecific abnormal finding of lung field: Secondary | ICD-10-CM | POA: Diagnosis not present

## 2016-05-17 DIAGNOSIS — C7800 Secondary malignant neoplasm of unspecified lung: Secondary | ICD-10-CM | POA: Insufficient documentation

## 2016-05-17 DIAGNOSIS — M4185 Other forms of scoliosis, thoracolumbar region: Secondary | ICD-10-CM | POA: Diagnosis not present

## 2016-05-17 DIAGNOSIS — N200 Calculus of kidney: Secondary | ICD-10-CM | POA: Diagnosis not present

## 2016-05-17 DIAGNOSIS — K760 Fatty (change of) liver, not elsewhere classified: Secondary | ICD-10-CM | POA: Diagnosis not present

## 2016-05-17 DIAGNOSIS — C50919 Malignant neoplasm of unspecified site of unspecified female breast: Secondary | ICD-10-CM | POA: Diagnosis present

## 2016-05-17 MED ORDER — IOPAMIDOL (ISOVUE-300) INJECTION 61%
30.0000 mL | Freq: Once | INTRAVENOUS | Status: DC | PRN
  Administered 2016-05-17: 30 mL via ORAL
  Filled 2016-05-17: qty 30

## 2016-05-17 MED ORDER — IOPAMIDOL (ISOVUE-300) INJECTION 61%
100.0000 mL | Freq: Once | INTRAVENOUS | Status: AC | PRN
  Administered 2016-05-17: 100 mL via INTRAVENOUS

## 2016-05-22 ENCOUNTER — Ambulatory Visit (HOSPITAL_BASED_OUTPATIENT_CLINIC_OR_DEPARTMENT_OTHER): Payer: 59

## 2016-05-22 ENCOUNTER — Other Ambulatory Visit (HOSPITAL_BASED_OUTPATIENT_CLINIC_OR_DEPARTMENT_OTHER): Payer: 59

## 2016-05-22 ENCOUNTER — Ambulatory Visit (HOSPITAL_BASED_OUTPATIENT_CLINIC_OR_DEPARTMENT_OTHER): Payer: 59 | Admitting: Hematology

## 2016-05-22 ENCOUNTER — Ambulatory Visit (HOSPITAL_BASED_OUTPATIENT_CLINIC_OR_DEPARTMENT_OTHER)
Admission: RE | Admit: 2016-05-22 | Discharge: 2016-05-22 | Disposition: A | Discharge status: Home / Self Care | Payer: 59 | Source: Ambulatory Visit | Attending: Internal Medicine | Admitting: Internal Medicine

## 2016-05-22 ENCOUNTER — Ambulatory Visit (HOSPITAL_COMMUNITY)
Admission: RE | Admit: 2016-05-22 | Discharge: 2016-05-22 | Disposition: A | Discharge status: Home / Self Care | Payer: 59 | Source: Ambulatory Visit | Attending: Internal Medicine | Admitting: Internal Medicine

## 2016-05-22 ENCOUNTER — Encounter (HOSPITAL_COMMUNITY): Payer: Self-pay | Admitting: Internal Medicine

## 2016-05-22 ENCOUNTER — Ambulatory Visit: Payer: 59

## 2016-05-22 ENCOUNTER — Telehealth: Payer: Self-pay | Admitting: Hematology

## 2016-05-22 VITALS — BP 148/90 | HR 78 | Wt 233.0 lb

## 2016-05-22 VITALS — BP 150/74 | HR 87 | Temp 98.6°F | Resp 17 | Ht 70.0 in | Wt 234.3 lb

## 2016-05-22 DIAGNOSIS — Z23 Encounter for immunization: Secondary | ICD-10-CM | POA: Diagnosis not present

## 2016-05-22 DIAGNOSIS — C50919 Malignant neoplasm of unspecified site of unspecified female breast: Secondary | ICD-10-CM | POA: Insufficient documentation

## 2016-05-22 DIAGNOSIS — C7800 Secondary malignant neoplasm of unspecified lung: Secondary | ICD-10-CM

## 2016-05-22 DIAGNOSIS — J329 Chronic sinusitis, unspecified: Secondary | ICD-10-CM

## 2016-05-22 DIAGNOSIS — G622 Polyneuropathy due to other toxic agents: Secondary | ICD-10-CM | POA: Diagnosis not present

## 2016-05-22 DIAGNOSIS — Z5112 Encounter for antineoplastic immunotherapy: Secondary | ICD-10-CM | POA: Diagnosis not present

## 2016-05-22 DIAGNOSIS — I340 Nonrheumatic mitral (valve) insufficiency: Secondary | ICD-10-CM | POA: Diagnosis not present

## 2016-05-22 DIAGNOSIS — Z95828 Presence of other vascular implants and grafts: Secondary | ICD-10-CM

## 2016-05-22 DIAGNOSIS — D630 Anemia in neoplastic disease: Secondary | ICD-10-CM

## 2016-05-22 DIAGNOSIS — L0889 Other specified local infections of the skin and subcutaneous tissue: Secondary | ICD-10-CM | POA: Diagnosis not present

## 2016-05-22 DIAGNOSIS — I501 Left ventricular failure: Secondary | ICD-10-CM | POA: Insufficient documentation

## 2016-05-22 DIAGNOSIS — C50911 Malignant neoplasm of unspecified site of right female breast: Secondary | ICD-10-CM | POA: Diagnosis not present

## 2016-05-22 LAB — COMPREHENSIVE METABOLIC PANEL
ALBUMIN: 3.5 g/dL (ref 3.5–5.0)
ALK PHOS: 89 U/L (ref 40–150)
ALT: 32 U/L (ref 0–55)
ANION GAP: 10 meq/L (ref 3–11)
AST: 21 U/L (ref 5–34)
BILIRUBIN TOTAL: 0.3 mg/dL (ref 0.20–1.20)
BUN: 20.2 mg/dL (ref 7.0–26.0)
CO2: 27 meq/L (ref 22–29)
CREATININE: 1 mg/dL (ref 0.6–1.1)
Calcium: 9.5 mg/dL (ref 8.4–10.4)
Chloride: 106 mEq/L (ref 98–109)
EGFR: 80 mL/min/{1.73_m2} — ABNORMAL LOW (ref >90)
Glucose: 128 mg/dl (ref 70–140)
Potassium: 3.9 mEq/L (ref 3.5–5.1)
Sodium: 142 mEq/L (ref 136–145)
TOTAL PROTEIN: 7.8 g/dL (ref 6.4–8.3)

## 2016-05-22 LAB — CBC WITH DIFFERENTIAL/PLATELET
BASO%: 0.2 % (ref 0.0–2.0)
Basophils Absolute: 0 10*3/uL (ref 0.0–0.1)
EOS%: 0.5 % (ref 0.0–7.0)
Eosinophils Absolute: 0 10*3/uL (ref 0.0–0.5)
HEMATOCRIT: 40 % (ref 34.8–46.6)
HEMOGLOBIN: 12.9 g/dL (ref 11.6–15.9)
LYMPH#: 1.4 10*3/uL (ref 0.9–3.3)
LYMPH%: 15.3 % (ref 14.0–49.7)
MCH: 28.5 pg (ref 25.1–34.0)
MCHC: 32.2 g/dL (ref 31.5–36.0)
MCV: 88.5 fL (ref 79.5–101.0)
MONO#: 0.5 10*3/uL (ref 0.1–0.9)
MONO%: 5.5 % (ref 0.0–14.0)
NEUT%: 78.5 % — ABNORMAL HIGH (ref 38.4–76.8)
NEUTROS ABS: 7.3 10*3/uL — AB (ref 1.5–6.5)
Platelets: 237 10*3/uL (ref 145–400)
RBC: 4.52 10*6/uL (ref 3.70–5.45)
RDW: 13.8 % (ref 11.2–14.5)
WBC: 9.3 10*3/uL (ref 3.9–10.3)

## 2016-05-22 MED ORDER — SODIUM CHLORIDE 0.9 % IV SOLN
Freq: Once | INTRAVENOUS | Status: AC
  Administered 2016-05-22: 14:00:00 via INTRAVENOUS

## 2016-05-22 MED ORDER — INFLUENZA VAC SPLIT QUAD 0.5 ML IM SUSY
0.5000 mL | PREFILLED_SYRINGE | Freq: Once | INTRAMUSCULAR | Status: AC
  Administered 2016-05-22: 0.5 mL via INTRAMUSCULAR
  Filled 2016-05-22: qty 0.5

## 2016-05-22 MED ORDER — LORAZEPAM 0.5 MG PO TABS: 0.5000 mg | ORAL_TABLET | Freq: Three times a day (TID) | ORAL | 0 refills | Status: DC

## 2016-05-22 MED ORDER — ACETAMINOPHEN 325 MG PO TABS
650.0000 mg | ORAL_TABLET | Freq: Once | ORAL | Status: AC
  Administered 2016-05-22: 650 mg via ORAL

## 2016-05-22 MED ORDER — SODIUM CHLORIDE 0.9 % IJ SOLN
10.0000 mL | INTRAMUSCULAR | Status: DC | PRN
  Administered 2016-05-22: 10 mL via INTRAVENOUS
  Filled 2016-05-22: qty 10

## 2016-05-22 MED ORDER — ACETAMINOPHEN 325 MG PO TABS
ORAL_TABLET | ORAL | Status: AC
  Filled 2016-05-22: qty 2

## 2016-05-22 MED ORDER — HEPARIN SOD (PORK) LOCK FLUSH 100 UNIT/ML IV SOLN
500.0000 [IU] | Freq: Once | INTRAVENOUS | Status: AC | PRN
  Administered 2016-05-22: 500 [IU]
  Filled 2016-05-22: qty 5

## 2016-05-22 MED ORDER — TRASTUZUMAB CHEMO 150 MG IV SOLR
6.0000 mg/kg | Freq: Once | INTRAVENOUS | Status: AC
  Administered 2016-05-22: 609 mg via INTRAVENOUS
  Filled 2016-05-22: qty 29

## 2016-05-22 MED ORDER — SODIUM CHLORIDE 0.9 % IJ SOLN
10.0000 mL | INTRAMUSCULAR | Status: DC | PRN
  Administered 2016-05-22: 10 mL
  Filled 2016-05-22: qty 10

## 2016-05-22 MED ORDER — ACETAMINOPHEN 325 MG PO TABS
ORAL_TABLET | ORAL | Status: AC
  Filled 2016-05-22: qty 1

## 2016-05-22 NOTE — Telephone Encounter (Signed)
Left message for patient re 10/31 and 11/21 appointments. Schedule mailed. Message to [place] re no mammo/mri orders.

## 2016-05-22 NOTE — Progress Notes (Signed)
Patient ID: [name] [name], female   DOB: 25-Aug-1970, 45 y.o.   MRN: [id number]   [facility] NOTE  Patient Care Team: Pcp Not In System as PCP - General [name], DO (Family Medicine) Holley Bouche, NP as Nurse Practitioner (Nurse Practitioner) Stark Klein, MD as Consulting Physician (General Surgery) Arloa Koh, MD as Consulting Physician (Radiation Oncology) [name], MD as Consulting Physician (Hematology)  HPI:   [name] [name] is a 45 y/o woman with Stage IV breast CA referred by Dr. Burr Medico for enrollment into the cardio-oncology clinic for surveillance while receiving Hercetin (starter 04/12/15 and on indefinitiely)  Oncology History   Breast cancer 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   Staging form: Breast, AJCC 7th Edition     Clinical stage from 07/22/2014: Stage IV (T3, N1, M1) - Unsigned       Breast cancer 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 ([place])   07/02/2014 Mammogram    Mammogram showed a 2cm right beast mass and a 1.8cm right axillary node. MRI breast on 07/16/2014 showed 7cm R breast lesion and 4.4cm r axillary node       07/02/2014 Imaging    CT CAP: a 4.7cm mass in LUL lung and a 2.1cm mas in RML, and a small nodule in RUL, suspecious for metastasis        07/09/2014 Initial Diagnosis    right IDA with b/l lung lesions, ER-/PR-/HER2+      07/09/2014 Initial Biopsy    US guided right breast mass and axillary node biopsy showed IDA, and DCIS, ER-/PR-/HER2+      07/26/2014 Pathologic Stage    Left lung mass by IR, path revealed high grade carcinoma, morphology similar to breast tumor biopsy, TTF(-), NapsinA(-), ER(-)      08/04/2014 - 03/22/2015 Chemotherapy    weekly Paclitaxel 27m/m2, trastuzumab and pertuzumab every 3 weeks      10/04/2014 Imaging    Interval decrease in the right axillary lymphadenopathy. Bilateral pulmonary lesions with left hilar lymphadenopathy also markedly decreased in the interval. The left hilar lymphadenopathy has resolved.      12/20/2014 Imaging    restaging CT showed stable disease, no new lesions       03/29/2015 Pathology Results     right breast lumpectomy showed  chemotherapy treatment effect,  a 1 mm residual tumor,   margins were widely negative, 5 sentinel lymph nodes and 2 axillary lymph nodes were negative.       03/29/2015 Surgery     right breast lumpectomy and sentinel lymph node biopsy  by Dr. BBarry Dienes     04/12/2015 -  Chemotherapy     Herceptin maintenance therapy , 6 mg/kg, every 3 weeks      05/03/2015 - 06/14/2015 Radiation Therapy    right breast adjuvant irradiation by Dr. MValere Dross       Denies h/o known cardiac disease. Continues to receive herceptin for metastatic breast CA (has been receiving Herceptin since 8/16).  No CP, SOB, edema, orthopnea or PND. .Marland Kitchen  Echo 3/16 55-60% Lat s' 12.0 cm/sec GLS -17.5% Echo 2/17 55-60%  Lat s' 11.2 cm/sec GLS -17.2% Echo 01/18/16 EF 60-65% lat s' 7.9 cm/sec (out of plane) GLS -20.2% Echo 05/22/16 EF 60-65%  Lat s'11.2cm.sec GLS underestimated due to poor endocardial tracking  MEDICAL HISTORY:  Past Medical History:  Diagnosis Date  . Breast cancer ([name] [name]   . Breast cancer ([name] [name]   . GERD (gastroesophageal reflux disease)    during pregnancy   . Pneumonia  hx of pneumonia 08/2013   . S/P radiation therapy 05/03/2015 through 06/14/2015    Right breast 4680 cGy in 26 sessions, right breast boost 1000 cGy in 5 sessions. Right supraclavicular/axillary region 4680 cGy with a supplemental PA field to bring the axillary dose up to 4500 cGy in 26 sessions    SURGICAL HISTORY: Past Surgical History:  Procedure Laterality Date  . BREAST LUMPECTOMY WITH RADIOACTIVE SEED AND SENTINEL LYMPH NODE BIOPSY Right 03/29/2015   Procedure: BREAST LUMPECTOMY WITH RADIOACTIVE SEED AND SENTINEL LYMPH NODE BIOPSY;  Surgeon: Stark Klein, MD;  Location: [facility];  Service: General;  Laterality: Right;  .  CESAREAN SECTION    . CHOLECYSTECTOMY    . ESSURE TUBAL LIGATION    . PORTACATH PLACEMENT Left 07/21/2014   Procedure: INSERTION PORT-A-CATH;  Surgeon: Stark Klein, MD;  Location[facility];  Service: General;  Laterality: Left;  . WISDOM TOOTH EXTRACTION      SOCIAL HISTORY: History   Social History  . Marital Status: Married    Spouse Name: N/A    Number of Children:  she has 2 daughters at the age of 10 and 35.   . Years of Education: N/A   Occupational History  .  works as Freight forwarder for a [employer].    Social History Main Topics  . Smoking status: Never Smoker   . Smokeless tobacco: Not on file  . Alcohol Use: Yes  . Drug Use: No  . Sexual Activity: No    FAMILY HISTORY: Family History  Problem Relation Age of Onset  . Breast cancer Mother 36  . Liver cancer Father 53  . Breast cancer Maternal Aunt 36  . Prostate cancer Maternal Grandfather   . Liver cancer Paternal Grandmother   . Prostate cancer Paternal Grandfather      GENETICS: 08/30/2014 BreastNext panel was negative. 17 genes including BRCA1, BRCA2, were negative for mutations.   ALLERGIES:  is allergic to adhesive [tape]; aspirin; and codeine.  MEDICATIONS:  Current Outpatient Prescriptions  Medication Sig Dispense Refill  . albuterol (PROVENTIL HFA;VENTOLIN HFA) 108 (90 Base) MCG/ACT inhaler Inhale 2 puffs into the lungs every 6 (six) hours as needed for wheezing or shortness of breath. 1 Inhaler 2  . Calcium-Phosphorus-Vitamin D (CALCIUM GUMMIES PO) Take by mouth daily.    . Camphor-Eucalyptus-Menthol (VICKS VAPORUB EX) Apply 1 application topically at bedtime. Applies under nose, throat and on chest.    . cetirizine (ZYRTEC) 10 MG tablet Take 10 mg by mouth daily as needed for allergies (allergies).     . clindamycin (CLINDAGEL) 1 % gel Apply topically to affected areas BID. 60 g 3  . fexofenadine (ALLEGRA) 180 MG tablet Take 180 mg by mouth daily as needed.  4  . fluticasone (FLONASE) 50 MCG/ACT  nasal spray   1  . hydrocortisone 2.5 % cream APPLY TOPICALLY TO THE AFFECTED AREA TWICE DAILY 454 g 0  . lidocaine-prilocaine (EMLA) cream APPLY TOPICALLY TO THE AFFECTED AREA AS NEEDED 30 g 0  . metaxalone (SKELAXIN) 800 MG tablet Take 1 tablet (800 mg total) by mouth 3 (three) times daily. 30 tablet 0  . Multiple Vitamins-Minerals (MULTIVITAMIN GUMMIES ADULT PO) Take 1 each by mouth daily. Women's Vitafusion gummie    . nystatin (NYSTATIN) powder Apply topically 2 (two) times daily. 15 g 2  . predniSONE (STERAPRED UNI-PAK 21 TAB) 10 MG (21) TBPK tablet Take 1 tablet (10 mg total) by mouth daily. Take 6 tabs by mouth daily  for 2 days, then 5 tabs for 2 days, then 4 tabs for 2 days, then 3 tabs for 2 days, 2 tabs for 2 days, then 1 tab by mouth daily for 2 days 42 tablet 0  . Pseudoeph-Doxylamine-DM-APAP (NYQUIL PO) Take 30 mLs by mouth 2 (two) times daily as needed (cold/allergies).     . triamcinolone (KENALOG) 0.025 % ointment Apply 1 application topically 2 (two) times daily. 30 g 0  . loperamide (IMODIUM A-D) 2 MG tablet Take 1 tablet (2 mg total) by mouth 4 (four) times daily as needed for diarrhea or loose stools. (Patient not taking: Reported on 05/22/2016) 30 tablet 0  . LORazepam (ATIVAN) 0.5 MG tablet Take 1 tablet (0.5 mg total) by mouth every 8 (eight) hours. 3 tablet 0   No current facility-administered medications for this encounter.    Facility-Administered Medications Ordered in Other Encounters  Medication Dose Route Frequency Provider Last Rate Last Dose  . acetaminophen (TYLENOL) tablet 650 mg  650 mg Oral Once [name], MD      . sodium chloride 0.9 % injection 10 mL  10 mL Intracatheter PRN [name], MD   10 mL at 05/01/16 1622     PHYSICAL EXAMINATION: General:  Well appearing. No resp difficulty.  HEENT: normal Neck: supple. no JVD. Carotids 2+ bilat; no bruits. No lymphadenopathy or thryomegaly appreciated. + port-a-cath Cor: PMI nondisplaced. Regular rate &  rhythm. No rubs, gallops or murmurs. Lungs: clear Abdomen: obese soft, nontender, nondistended. No hepatosplenomegaly. No bruits or masses. Good bowel sounds. Extremities: no cyanosis, clubbing, rash, edema Neuro: alert & orientedx3, cranial nerves grossly intact. moves all 4 extremities w/o difficulty. Affect pleasant   LABORATORY DATA:  I have reviewed the data as listed CBC Latest Ref Rng & Units 05/22/2016 05/01/2016 04/10/2016  WBC 3.9 - 10.3 10e3/uL 9.3 6.1 5.0  Hemoglobin 11.6 - 15.9 g/dL 12.9 12.1 12.3  Hematocrit 34.8 - 46.6 % 40.0 36.5 37.2  Platelets 145 - 400 10e3/uL 237 223 210    CMP Latest Ref Rng & Units 05/22/2016 05/01/2016 04/10/2016  Glucose 70 - 140 mg/dl 128 92 125  BUN 7.0 - 26.0 mg/dL 20.2 14.2 13.2  Creatinine 0.6 - 1.1 mg/dL 1.0 0.8 0.9  Sodium 136 - 145 mEq/L 142 140 141  Potassium 3.5 - 5.1 mEq/L 3.9 3.9 3.9  Chloride 101 - 111 mmol/L - - -  CO2 22 - 29 mEq/L 27 26 25   Calcium 8.4 - 10.4 mg/dL 9.5 9.3 9.4  Total Protein 6.4 - 8.3 g/dL 7.8 7.9 7.5  Total Bilirubin 0.20 - 1.20 mg/dL 0.30 0.35 0.37  Alkaline Phos 40 - 150 U/L 89 90 80  AST 5 - 34 U/L 21 20 20   ALT 0 - 55 U/L 32 27 26     ASSESSMENT & PLAN:   1. R breast invasive ductal carcinoma, T3N1M1, stage IV, ER-/PR-/HER2+, with metastases to b/l lungs, biopsy confirmed I reviewed echos personally. EF and Doppler parameters stable. No HF on exam. Continue Herceptin. Repeat echo in 6 months.  Glori Bickers MD 05/22/2016

## 2016-05-22 NOTE — Patient Instructions (Signed)
[facility] Discharge Instructions for Patients Receiving Chemotherapy  Today you received the following chemotherapy agents: Herceptin   To help prevent nausea and vomiting after your treatment, we encourage you to take your nausea medication as directed.    If you develop nausea and vomiting that is not controlled by your nausea medication, call the clinic.   BELOW ARE SYMPTOMS THAT SHOULD BE REPORTED IMMEDIATELY:  *FEVER GREATER THAN 100.5 F  *CHILLS WITH OR WITHOUT FEVER  NAUSEA AND VOMITING THAT IS NOT CONTROLLED WITH YOUR NAUSEA MEDICATION  *UNUSUAL SHORTNESS OF BREATH  *UNUSUAL BRUISING OR BLEEDING  TENDERNESS IN MOUTH AND THROAT WITH OR WITHOUT PRESENCE OF ULCERS  *URINARY PROBLEMS  *BOWEL PROBLEMS  UNUSUAL RASH Items with * indicate a potential emergency and should be followed up as soon as possible.  Feel free to call the clinic you have any questions or concerns. The clinic phone number is [phone].  Please show the CHEMO ALERT CARD at check-in to the Emergency Department and triage nurse.   

## 2016-05-22 NOTE — Progress Notes (Signed)
  Echocardiogram 2D Echocardiogram limited has been performed.  [name] [name] 05/22/2016, 10:06 AM

## 2016-05-22 NOTE — Progress Notes (Signed)
[name] Follow up note   Patient Care Team: Pcp Not In System as PCP - General [name] Pickler, DO (Family Medicine) [name] Bouche, NP as Nurse Practitioner (Nurse Practitioner) [name] Klein, MD as Consulting Physician (General Surgery) [name] Koh, MD as Consulting Physician (Radiation Oncology) [name] Merle, MD as Consulting Physician (Hematology)  CHIEF COMPLAINTS Follow up metastatic breast cancer  Oncology History   Breast cancer 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   Staging form: Breast, AJCC 7th Edition     Clinical stage from 07/22/2014: Stage IV (T3, N1, M1) - Unsigned       Breast cancer 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 ([place])   07/02/2014 Mammogram    Mammogram showed a 2cm right beast mass and a 1.8cm right axillary node. MRI breast on 07/16/2014 showed 7cm R breast lesion and 4.4cm r axillary node       07/02/2014 Imaging    CT CAP: a 4.7cm mass in LUL lung and a 2.1cm mas in RML, and a small nodule in RUL, suspecious for metastasis        07/09/2014 Initial Diagnosis    right IDA with b/l lung lesions, ER-/PR-/HER2+      07/09/2014 Initial Biopsy    US guided right breast mass and axillary node biopsy showed IDA, and DCIS, ER-/PR-/HER2+      07/26/2014 Pathologic Stage    Left lung mass by IR, path revealed high grade carcinoma, morphology similar to breast tumor biopsy, TTF(-), NapsinA(-), ER(-)      08/04/2014 - 03/22/2015 Chemotherapy    weekly Paclitaxel 27m/m2, trastuzumab and pertuzumab every 3 weeks      10/04/2014 Imaging    Interval decrease in the right axillary lymphadenopathy. Bilateral pulmonary lesions with left hilar lymphadenopathy also markedly decreased in the interval. The left hilar lymphadenopathy has resolved.      12/20/2014 Imaging    restaging CT showed stable disease, no new lesions       03/29/2015 Pathology Results     right breast lumpectomy showed  chemotherapy treatment effect,  a 1 mm residual tumor,    margins were widely negative, 5 sentinel lymph nodes and 2 axillary lymph nodes were negative.       03/29/2015 Surgery     right breast lumpectomy and sentinel lymph node biopsy  by [name] [name]     04/12/2015 -  Chemotherapy     Herceptin maintenance therapy , 6 mg/kg, every 3 weeks      05/03/2015 - 06/14/2015 Radiation Therapy    right breast adjuvant irradiation by Dr. MValere [name]       CURRENT THERAPY: Herceptin maintenance therapy every 3 weeks, started on 04/12/2015  INTERVAL HISTROY   [name] [name] for follow-up and Herceptin treatment. She has been having intermittent sinus infection in the past few months, she was seen in the ED for sinusitis on 05/11/2016. She was given a course of steroids, and her symptoms have much improved. No fever or chills, no productive cough, no chest pain or dyspnea. She otherwise feels well, has good appetite and energy level, tolerating full time job well.   MEDICAL HISTORY:  Past Medical History:  Diagnosis Date  . [place] cancer [place]   . [place]   . GERD (gastroesophageal reflux disease)    during pregnancy   . Pneumonia    hx of pneumonia 08/2013   . S/P radiation therapy 05/03/2015 through 06/14/2015    Right breast 4680 cGy in 26 sessions, right breast  boost 1000 cGy in 5 sessions. Right supraclavicular/axillary region 4680 cGy with a supplemental PA field to bring the axillary dose up to 4500 cGy in 26 sessions    SURGICAL HISTORY: Past Surgical History:  Procedure Laterality Date  . BREAST LUMPECTOMY WITH RADIOACTIVE SEED AND SENTINEL LYMPH NODE BIOPSY Right 03/29/2015   Procedure: BREAST LUMPECTOMY WITH RADIOACTIVE SEED AND SENTINEL LYMPH NODE BIOPSY;  Surgeon: [name] Klein, MD;  Location: [facility];  Service: General;  Laterality: Right;  . CESAREAN SECTION    . CHOLECYSTECTOMY    . ESSURE TUBAL LIGATION    . PORTACATH PLACEMENT Left 07/21/2014   Procedure:  INSERTION PORT-A-CATH;  Surgeon: [name] Klein, MD;  Location: [facility];  Service: General;  Laterality: Left;  . WISDOM TOOTH EXTRACTION      SOCIAL HISTORY: History   Social History  . Marital Status: Married    Spouse Name: N/A    Number of Children:  she has 2 daughters at the age of 74 and 30.   . Years of Education: N/A   Occupational History  .  works as Freight forwarder for a [employer].    Social History Main Topics  . Smoking status: Never Smoker   . Smokeless tobacco: Not on file  . Alcohol Use: Yes  . Drug Use: No  . Sexual Activity: No    FAMILY HISTORY: Family History  Problem Relation Age of Onset  . Breast cancer Mother 59  . Liver cancer Father 71  . Breast cancer Maternal Aunt 36  . Prostate cancer Maternal Grandfather   . Liver cancer Paternal Grandmother   . Prostate cancer Paternal Grandfather      GENETICS: 08/30/2014 BreastNext panel was negative. 17 genes including BRCA1, BRCA2, were negative for mutations.   ALLERGIES:  is allergic to adhesive [tape]; aspirin; and codeine.  MEDICATIONS:  Current Outpatient Prescriptions  Medication Sig Dispense Refill  . albuterol (PROVENTIL HFA;VENTOLIN HFA) 108 (90 Base) MCG/ACT inhaler Inhale 2 puffs into the lungs every 6 (six) hours as needed for wheezing or shortness of breath. 1 Inhaler 2  . Calcium-Phosphorus-Vitamin D (CALCIUM GUMMIES PO) Take by mouth daily.    . Camphor-Eucalyptus-Menthol (VICKS VAPORUB EX) Apply 1 application topically at bedtime. Applies under nose, throat and on chest.    . cetirizine (ZYRTEC) 10 MG tablet Take 10 mg by mouth daily as needed for allergies (allergies).     . chlorpheniramine-HYDROcodone (TUSSIONEX PENNKINETIC ER) 10-8 MG/5ML SUER Take 5 mLs by mouth every 12 (twelve) hours as needed for cough. 115 mL 0  . clindamycin (CLINDAGEL) 1 % gel Apply topically to affected areas BID. 60 g 3  . fexofenadine (ALLEGRA) 180 MG tablet Take 180 mg by mouth daily as needed.  4  .  fluconazole (DIFLUCAN) 100 MG tablet Take 1 tablet (100 mg total) by mouth daily. 7 tablet 0  . fluticasone (FLONASE) 50 MCG/ACT nasal spray   1  . hydrocortisone 2.5 % cream APPLY TOPICALLY TO THE AFFECTED AREA TWICE DAILY 454 g 0  . lidocaine-prilocaine (EMLA) cream APPLY TOPICALLY TO THE AFFECTED AREA AS NEEDED 30 g 0  . loperamide (IMODIUM A-D) 2 MG tablet Take 1 tablet (2 mg total) by mouth 4 (four) times daily as needed for diarrhea or loose stools. 30 tablet 0  . metaxalone (SKELAXIN) 800 MG tablet Take 1 tablet (800 mg total) by mouth 3 (three) times daily. 30 tablet 0  . Multiple Vitamins-Minerals (MULTIVITAMIN GUMMIES ADULT PO) Take 1 each  by mouth daily. Women's Vitafusion gummie    . nystatin (NYSTATIN) powder Apply topically 2 (two) times daily. 15 g 2  . ondansetron (ZOFRAN) 8 MG tablet Take 1 tablet (8 mg total) by mouth every 8 (eight) hours as needed for nausea or vomiting. 30 tablet 3  . predniSONE (STERAPRED UNI-PAK 21 TAB) 10 MG (21) TBPK tablet Take 1 tablet (10 mg total) by mouth daily. Take 6 tabs by mouth daily  for 2 days, then 5 tabs for 2 days, then 4 tabs for 2 days, then 3 tabs for 2 days, 2 tabs for 2 days, then 1 tab by mouth daily for 2 days 42 tablet 0  . Pseudoeph-Doxylamine-DM-APAP (NYQUIL PO) Take 30 mLs by mouth 2 (two) times daily as needed (cold/allergies).     . triamcinolone (KENALOG) 0.025 % ointment Apply 1 application topically 2 (two) times daily. 30 g 0   No current facility-administered medications for this visit.    Facility-Administered Medications Ordered in Other Visits  Medication Dose Route Frequency Provider Last Rate Last Dose  . acetaminophen (TYLENOL) tablet 650 mg  650 mg Oral Once [name] Merle, MD      . sodium chloride 0.9 % injection 10 mL  10 mL Intracatheter PRN [name] Merle, MD   10 mL at 05/01/16 1622    REVIEW OF SYSTEMS:   Constitutional: Denies fevers, chills or abnormal night sweats Eyes: Denies blurriness of vision, double vision or  watery eyes Ears, nose, mouth, throat, and face: Denies mucositis or sore throat Respiratory: Occasional dry cough, no dyspnea or wheezes Cardiovascular: Denies palpitation, chest discomfort or lower extremity swelling Gastrointestinal:  Denies nausea, heartburn or change in bowel habits Skin:(+)  skin pigmentation on bilateral forearms and left elbow, much improved Lymphatics: Denies new lymphadenopathy or easy bruising Neurological:Denies numbness, tingling or new weaknesses Behavioral/Psych: Mood is stable, no new changes  All other systems were reviewed with the patient and are negative.  PHYSICAL EXAMINATION: ECOG PERFORMANCE STATUS: 0 BP (!) 150/74 (BP Location: Left Arm, Patient Position: Sitting)   Pulse 87   Temp 98.6 F (37 C) (Oral)   Resp 17   Ht 5' 10"  (1.778 m)   Wt 234 lb 4.8 oz (106.3 kg)   SpO2 100%   BMI 33.62 kg/m   GENERAL:alert, no distress and comfortable SKIN: skin color, texture, turgor are normal, no rashes or significant lesions EYES: normal, conjunctiva are pink and non-injected, sclera clear OROPHARYNX:no exudate, no erythema and lips, buccal mucosa, and tongue normal  NECK: supple, thyroid normal size, non-tender, without nodularity LYMPH:  no palpable lymphadenopathy in the cervical, axillary or inguinal LUNGS: clear to auscultation and percussion with normal breathing effort HEART: regular rate & rhythm and no murmurs and no lower extremity edema ABDOMEN:abdomen soft, non-tender and normal bowel sounds Musculoskeletal:no cyanosis of digits and no clubbing, no tenderness on bilateral chest wall or spine PSYCH: alert & oriented x 3 with fluent speech NEURO: no focal motor/sensory deficits Breasts:  Status post right breast lumpectomy and sentinel lymph node biopsy, incisions have healed well. The previous palpable seroma has resolved. (+) mild skin pigmentation of right breast secondary to radiation. The skin peeling underneath the right breast has  healed well. Palpation of both breasts  and axillas revealed no other obvious mass that I could appreciate. Skin: No skin rashes.   LABORATORY DATA:  I have reviewed the data as listed CBC Latest Ref Rng & Units 05/22/2016 05/01/2016 04/10/2016  WBC 3.9 - 10.3 10e3/uL  9.3 6.1 5.0  Hemoglobin 11.6 - 15.9 g/dL 12.9 12.1 12.3  Hematocrit 34.8 - 46.6 % 40.0 36.5 37.2  Platelets 145 - 400 10e3/uL 237 223 210    CMP Latest Ref Rng & Units 05/22/2016 05/01/2016 04/10/2016  Glucose 70 - 140 mg/dl 128 92 125  BUN 7.0 - 26.0 mg/dL 20.2 14.2 13.2  Creatinine 0.6 - 1.1 mg/dL 1.0 0.8 0.9  Sodium 136 - 145 mEq/L 142 140 141  Potassium 3.5 - 5.1 mEq/L 3.9 3.9 3.9  Chloride 101 - 111 mmol/L - - -  CO2 22 - 29 mEq/L 27 26 25   Calcium 8.4 - 10.4 mg/dL 9.5 9.3 9.4  Total Protein 6.4 - 8.3 g/dL 7.8 7.9 7.5  Total Bilirubin 0.20 - 1.20 mg/dL 0.30 0.35 0.37  Alkaline Phos 40 - 150 U/L 89 90 80  AST 5 - 34 U/L 21 20 20   ALT 0 - 55 U/L 32 27 26    PATHOLOGY REPORT 07/09/2014 #1 breast, right needle core biopsy more anterior -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 45)  Lung, biopsy, Left 07/26/2014 - HIGH GRADE CARCINOMA, SEE COMMENT. Microscopic Comment The carcinoma demonstrates the following immunophenotype: TTF-1 - negative expression. Napsin A- negative expression. CK5/6 - focal, moderate expression. estrogen receptor - negative expression. GCDFP- negative expression. The recent breast biopsy demonstrating Her2 amplified high grade invasive mammary carcinoma is noted ([id number]). Although the immunophenotype of the current case is non-sepcific, it strongly argues against primary lung adenocarcinoma. However, on re-review, the morphology of the current case is  essentially identical to the primary mammary carcinoma. In lieu of further immunophenotyping, the tumor will be submitted for Her2 testing and the remaining tissue will be reserved for additional ancillary tumor testing. The results of the Her2 testing will be reported in an addendum. The case was discussed with Dr Burr Medico on 07/28/2015 and 07/29/2015 HER2 POSITIVE   Diagnosis 03/29/2015 1. Breast, lumpectomy, Right - INVASIVE DUCTAL CARCINOMA, SEE COMMENT. - SEE TUMOR SYNOPTIC TEMPLATE BELOW. 2. Lymph node, sentinel, biopsy, Right axillary #1 - ONE LYMPH NODE, NEGATIVE FOR TUMOR (0/1). - SEE COMMENT. 3. Lymph node, sentinel, biopsy, Right axillary #2 - BENIGN FIBROFATTY SOFT TISSUE. - NEGATIVE FOR ATYPIA OR MALIGNANCY. - NEGATIVE FOR LYMPH NODE. 4. Lymph node, sentinel, biopsy, Right axillary #3 - ONE LYMPH NODE, NEGATIVE FOR TUMOR (0/1). - SEE COMMENT. 5. Lymph node, sentinel, biopsy, Right axillary #4 - ONE LYMPH NODE, NEGATIVE FOR TUMOR (0/1). - SEE COMMENT. 6. Lymph node, sentinel, biopsy, Right axillary #5 - ONE LYMPH NODE, NEGATIVE FOR TUMOR (0/1). - SEE COMMENT. 7. Lymph node, sentinel, biopsy, right axillary - ONE LYMPH NODE, NEGATIVE FOR TUMOR (0/1). - SEE COMMENT. 8. Lymph node, sentinel, biopsy, right axillary - ONE LYMPH NODE, NEGATIVE FOR TUMOR (0/1). - SEE COMMENT. 1 of 4 Amended copy Amended FINAL for LELER, BRION ([id number]) Microscopic Comment 1. BREAST, INVASIVE TUMOR, WITH LYMPH NODES PRESENT Specimen, including laterality and lymph node sampling (sentinel, non-sentinel): Right breast with sentinel and non-sentinel lymph node sampling Procedure: Lumpectomy Histologic type: Ductal, see comment Grade: 2 of 3, see comment. Tubule formation: 3 Nuclear pleomorphism: 3 Mitotic: 1 Tumor size (glass slide measurement): 1 mm, see comment Margins: Invasive, distance to closest margin: See comment In-situ, distance to closest margin: N/A If margin  positive, focally or  broadly: N/A Lymphovascular invasion: Absent Ductal carcinoma in situ: Absent Grade: N/A Extensive intraductal component: N/A Lobular neoplasia: Absent Tumor focality: Unifocal, see comment Treatment effect: Present If present, treatment effect in breast tissue, lymph nodes or both: Both breast tissue and lymph nodes Extent of tumor: Skin: N/A Nipple: N/A Skeletal muscle: N/A Lymph nodes: Examined: 5 Sentinel 2 Non-sentinel 7 Total Lymph nodes with metastasis: 0 Isolated tumor cells (< 0.2 mm): N/A Micrometastasis: (> 0.2 mm and < 2.0 mm): N/A Macrometastasis: (> 2.0 mm): N/A Extracapsular extension: N/A Breast prognostic profile: Estrogen receptor: Not repeated, previous study demonstrated 0% positivity ([id number]). Progesterone receptor: Not repeated, previous study demonstrated 0% positivity ([id number]) Her 2 neu: Demonstrates amplificaition ([id number]) Ki-67: Not repeated, previous study demonstrate 79% proliferation rate ([id number]). Non-neoplastic breast: Neoadjuvant related tissue changes and adenosis. TNM: ypT55m, ypN0, pMX Comments: Numerous representative sections including the 2.0 cm hemorrhagic tissue associated with X-shaped biopsy clip, the 2.0 cm ill defined lesion associated with M-shaped clip, and intervening tissue were submitted for review. Slide sections from the tissue surrounding the X-shaped and M-shaped clips demonstrate tissue changes consistent with neoadjuvant related change. There is no invasive or in situ carcinom present at either site. However, in a representative section from the intervening tissue (slide G), there is a 1 mm focus of high grade invasive ductal carcinoma present. Given the minute size of the tumor, tumor grading is limited. The tumor is present within non-marginal tissue sections and is considered to be at least 0.5 cm from the nearest margin (medial).   Note: Amendment issued for modification of  synoptic table, inadvertent typographical error. The change in the synoptic table was discussed with Dr FBurr Medicoon 04/07/2015. (CRR:ecj 04/07/2015) 2. , 4-8. In parts 2 and 4, there is extensive neoadjuvant related tissue changes including abundant foamy macrophages, fibroinflammatory reaction and dystrophic calcifications. The neoadjuvant tissue changes extend into perinodal soft tissue and are either focally or broadly present at the cauterized edge of the tissue submitted. There are no definitive features of malignancy present.  RADIOGRAPHIC STUDIES:  CT chest, abdomen and pelvis with contrast 05/17/2016 IMPRESSION: 1. Stable appearance of the chest, abdomen, and pelvis, with bandlike density with very minimal nodularity in the left upper lobe at the site of previously treated metastatic disease unchanged from prior. No findings of new metastatic disease. 2. Mild airway thickening and minimal airway plugging in the right lower lobe. 3. Other imaging findings of potential clinical significance: Mild thoracolumbar scoliosis. Diffuse hepatic steatosis. 4 mm right kidney lower pole nonobstructive calculus. Prominent stool throughout the colon favors constipation.   Bone scan 02/23/2016 IMPRESSION: No definite evidence of osseous metastatic disease.  Subtle increased uptake in the posterior lateral aspect of the right eighth rib merits further evaluation with a right rib x-ray series.   ECHO 01/18/2016 Study Conclusions  - Left ventricle: The cavity size was normal. Wall thickness was  normal. Systolic function was normal. The estimated ejection  fraction was in the range of 60% to 65%. Wall motion was normal;  there were no regional wall motion abnormalities. Doppler  parameters are consistent with abnormal left ventricular  relaxation (grade 1 diastolic dysfunction). - Impressions: Lateral s&' 7.9 cm/sec, GLS -20.2%  Impressions:  - Lateral s&' 7.9 cm/sec, GLS  -20.2%   ASSESSMENT & PLAN:  45year old African-American female, premenopausal, without significant past medical history, presented with palpable right breast mass and right axillary mass.   1. 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 - I reviewed her breasts surgical pathology results with her in great detail ,  She has had fantastic partial response (near complete response) form 8 months of chemotherapy and antibody therapy.   The initial 6.3 cm breast mass has only 1 mm residual tumor,  And the previously 4.4 cm right axillary lymph node has had complete  Pathological response, all 7 nodes were negative for cancer. -I reviewed her restaging CT and bone scans from 05/17/2016, which showed no evidence of disease -She is clinically doing very well, labs are normal, no clinical concern for disease progression or recurrence. -We discussed is that Herceptin does not penetrate to CNS well, pre-metastasis is more frequent interval negative breast cancer. Due to the frequent sinus symptom, I recommend her to have a brain MRI, also to rule out brain mets  -continue herceptin indefinitely if no disease progression or significant side effects -Continue monitoring cardiac echo every 4 months, she will follow up with Dr. BHaroldine Laws  2. Right lateral chest pain -near resolved now, likely muscular pain. -Her recent bone scan showed subtle uptake in the lateral right eighth rib, corresponding to her chest pain. However there is no significant bone lesion or fracture on the CT scan.  3. Mild peripheral neuropathy, G1 -Probably related to her prior chemotherapy -Very mild, we'll continue monitoring.   4. Sinusitis -She has been treated for sinus infection several times daily, with  antibiotics and steroids -Her symptoms much improved, we'll continue allergy medication and observation  5. Skin fungal infection underneath right breast  -she is finishing fluconazole, skin rashes has much improved -I encouraged her to use cornstarch to keep the skin fold dry -She will use nystatin powder as needed   Plan for today  -Continue Herceptin every 3 weeks, treatment today. -RTC in 6 weeks for follow up with a brain MRI before   I spent about 20 minutes counseling the patient and her family members, total care was about 25 minutes.  FTruitt [name] 05/22/2016

## 2016-05-23 LAB — CANCER ANTIGEN 27.29: CA 27.29: 12.8 U/mL (ref 0.0–38.6)

## 2016-05-27 ENCOUNTER — Encounter: Payer: Self-pay | Admitting: Hematology

## 2016-06-12 ENCOUNTER — Ambulatory Visit (HOSPITAL_BASED_OUTPATIENT_CLINIC_OR_DEPARTMENT_OTHER): Payer: 59

## 2016-06-12 VITALS — BP 152/89 | HR 85 | Temp 98.8°F | Resp 16

## 2016-06-12 DIAGNOSIS — C50911 Malignant neoplasm of unspecified site of right female breast: Secondary | ICD-10-CM | POA: Diagnosis not present

## 2016-06-12 DIAGNOSIS — Z5112 Encounter for antineoplastic immunotherapy: Secondary | ICD-10-CM

## 2016-06-12 DIAGNOSIS — C7800 Secondary malignant neoplasm of unspecified lung: Secondary | ICD-10-CM

## 2016-06-12 MED ORDER — HEPARIN SOD (PORK) LOCK FLUSH 100 UNIT/ML IV SOLN
500.0000 [IU] | Freq: Once | INTRAVENOUS | Status: AC | PRN
  Administered 2016-06-12: 500 [IU]
  Filled 2016-06-12: qty 5

## 2016-06-12 MED ORDER — SODIUM CHLORIDE 0.9 % IJ SOLN
10.0000 mL | INTRAMUSCULAR | Status: DC | PRN
  Administered 2016-06-12: 10 mL
  Filled 2016-06-12: qty 10

## 2016-06-12 MED ORDER — SODIUM CHLORIDE 0.9 % IV SOLN
Freq: Once | INTRAVENOUS | Status: AC
  Administered 2016-06-12: 16:00:00 via INTRAVENOUS

## 2016-06-12 MED ORDER — TRASTUZUMAB CHEMO 150 MG IV SOLR
6.0000 mg/kg | Freq: Once | INTRAVENOUS | Status: AC
  Administered 2016-06-12: 609 mg via INTRAVENOUS
  Filled 2016-06-12: qty 29

## 2016-06-12 NOTE — Patient Instructions (Signed)
[facility] Discharge Instructions for Patients Receiving Chemotherapy  Today you received the following chemotherapy agents: Herceptin   To help prevent nausea and vomiting after your treatment, we encourage you to take your nausea medication as directed.    If you develop nausea and vomiting that is not controlled by your nausea medication, call the clinic.   BELOW ARE SYMPTOMS THAT SHOULD BE REPORTED IMMEDIATELY:  *FEVER GREATER THAN 100.5 F  *CHILLS WITH OR WITHOUT FEVER  NAUSEA AND VOMITING THAT IS NOT CONTROLLED WITH YOUR NAUSEA MEDICATION  *UNUSUAL SHORTNESS OF BREATH  *UNUSUAL BRUISING OR BLEEDING  TENDERNESS IN MOUTH AND THROAT WITH OR WITHOUT PRESENCE OF ULCERS  *URINARY PROBLEMS  *BOWEL PROBLEMS  UNUSUAL RASH Items with * indicate a potential emergency and should be followed up as soon as possible.  Feel free to call the clinic you have any questions or concerns. The clinic phone number is [phone].  Please show the CHEMO ALERT CARD at check-in to the Emergency Department and triage nurse.   

## 2016-06-27 ENCOUNTER — Ambulatory Visit (HOSPITAL_COMMUNITY)
Admission: RE | Admit: 2016-06-27 | Discharge: 2016-06-27 | Disposition: A | Discharge status: Home / Self Care | Payer: 59 | Source: Ambulatory Visit | Attending: Hematology | Admitting: Hematology

## 2016-06-27 DIAGNOSIS — C50911 Malignant neoplasm of unspecified site of right female breast: Secondary | ICD-10-CM

## 2016-06-27 DIAGNOSIS — C7800 Secondary malignant neoplasm of unspecified lung: Principal | ICD-10-CM

## 2016-06-28 ENCOUNTER — Other Ambulatory Visit: Payer: Self-pay | Admitting: Hematology

## 2016-06-28 ENCOUNTER — Telehealth: Payer: Self-pay | Admitting: *Deleted

## 2016-06-28 DIAGNOSIS — C50911 Malignant neoplasm of unspecified site of right female breast: Secondary | ICD-10-CM

## 2016-06-28 DIAGNOSIS — C7800 Secondary malignant neoplasm of unspecified lung: Principal | ICD-10-CM

## 2016-06-28 NOTE — Telephone Encounter (Signed)
Called pt & informed that Dr Burr Medico will order CT of head instead of MRI with conscious sedation due to concern that insurance may not cover

## 2016-06-28 NOTE — Telephone Encounter (Signed)
Received vm call from pt stating that she attempted to do brain MRI but was unsuccessful yest pm & was told to notify MD to see if conscious sedation can be used & if insurance will cover.  Message to Dr Burr Medico.

## 2016-07-03 ENCOUNTER — Ambulatory Visit (HOSPITAL_BASED_OUTPATIENT_CLINIC_OR_DEPARTMENT_OTHER): Payer: 59 | Admitting: Hematology

## 2016-07-03 ENCOUNTER — Telehealth: Payer: Self-pay | Admitting: Hematology

## 2016-07-03 ENCOUNTER — Other Ambulatory Visit (HOSPITAL_BASED_OUTPATIENT_CLINIC_OR_DEPARTMENT_OTHER): Payer: 59

## 2016-07-03 ENCOUNTER — Ambulatory Visit (HOSPITAL_BASED_OUTPATIENT_CLINIC_OR_DEPARTMENT_OTHER): Payer: 59

## 2016-07-03 ENCOUNTER — Ambulatory Visit (HOSPITAL_COMMUNITY)
Admission: RE | Admit: 2016-07-03 | Discharge: 2016-07-03 | Disposition: A | Discharge status: Home / Self Care | Payer: 59 | Source: Ambulatory Visit | Attending: Hematology | Admitting: Hematology

## 2016-07-03 ENCOUNTER — Encounter: Payer: Self-pay | Admitting: Hematology

## 2016-07-03 ENCOUNTER — Telehealth: Payer: Self-pay | Admitting: *Deleted

## 2016-07-03 ENCOUNTER — Encounter (HOSPITAL_COMMUNITY): Payer: Self-pay

## 2016-07-03 ENCOUNTER — Ambulatory Visit: Payer: 59

## 2016-07-03 VITALS — BP 152/72 | HR 81 | Temp 98.6°F | Resp 17 | Ht 70.0 in | Wt 238.9 lb

## 2016-07-03 DIAGNOSIS — R51 Headache: Secondary | ICD-10-CM | POA: Insufficient documentation

## 2016-07-03 DIAGNOSIS — C7800 Secondary malignant neoplasm of unspecified lung: Principal | ICD-10-CM

## 2016-07-03 DIAGNOSIS — C50911 Malignant neoplasm of unspecified site of right female breast: Secondary | ICD-10-CM

## 2016-07-03 DIAGNOSIS — Z5112 Encounter for antineoplastic immunotherapy: Secondary | ICD-10-CM | POA: Diagnosis not present

## 2016-07-03 DIAGNOSIS — J321 Chronic frontal sinusitis: Secondary | ICD-10-CM

## 2016-07-03 DIAGNOSIS — C7802 Secondary malignant neoplasm of left lung: Secondary | ICD-10-CM

## 2016-07-03 DIAGNOSIS — J329 Chronic sinusitis, unspecified: Secondary | ICD-10-CM | POA: Insufficient documentation

## 2016-07-03 DIAGNOSIS — G620 Drug-induced polyneuropathy: Secondary | ICD-10-CM

## 2016-07-03 DIAGNOSIS — G629 Polyneuropathy, unspecified: Secondary | ICD-10-CM

## 2016-07-03 DIAGNOSIS — T451X5A Adverse effect of antineoplastic and immunosuppressive drugs, initial encounter: Secondary | ICD-10-CM

## 2016-07-03 DIAGNOSIS — Z95828 Presence of other vascular implants and grafts: Secondary | ICD-10-CM

## 2016-07-03 LAB — CBC WITH DIFFERENTIAL/PLATELET
BASO%: 0.1 % (ref 0.0–2.0)
BASOS ABS: 0 10*3/uL (ref 0.0–0.1)
EOS ABS: 0.1 10*3/uL (ref 0.0–0.5)
EOS%: 1.4 % (ref 0.0–7.0)
HCT: 34.9 % (ref 34.8–46.6)
HEMOGLOBIN: 11.7 g/dL (ref 11.6–15.9)
LYMPH%: 22.8 % (ref 14.0–49.7)
MCH: 29.5 pg (ref 25.1–34.0)
MCHC: 33.5 g/dL (ref 31.5–36.0)
MCV: 87.9 fL (ref 79.5–101.0)
MONO#: 0.6 10*3/uL (ref 0.1–0.9)
MONO%: 8.3 % (ref 0.0–14.0)
NEUT#: 5 10*3/uL (ref 1.5–6.5)
NEUT%: 67.4 % (ref 38.4–76.8)
Platelets: 216 10*3/uL (ref 145–400)
RBC: 3.97 10*6/uL (ref 3.70–5.45)
RDW: 14.1 % (ref 11.2–14.5)
WBC: 7.3 10*3/uL (ref 3.9–10.3)
lymph#: 1.7 10*3/uL (ref 0.9–3.3)

## 2016-07-03 LAB — COMPREHENSIVE METABOLIC PANEL
ALT: 25 U/L (ref 0–55)
AST: 21 U/L (ref 5–34)
Albumin: 3.3 g/dL — ABNORMAL LOW (ref 3.5–5.0)
Alkaline Phosphatase: 75 U/L (ref 40–150)
Anion Gap: 8 mEq/L (ref 3–11)
BUN: 9.6 mg/dL (ref 7.0–26.0)
CHLORIDE: 105 meq/L (ref 98–109)
CO2: 26 meq/L (ref 22–29)
Calcium: 9.3 mg/dL (ref 8.4–10.4)
Creatinine: 0.8 mg/dL (ref 0.6–1.1)
GLUCOSE: 94 mg/dL (ref 70–140)
POTASSIUM: 3.6 meq/L (ref 3.5–5.1)
SODIUM: 139 meq/L (ref 136–145)
Total Bilirubin: 0.56 mg/dL (ref 0.20–1.20)
Total Protein: 7.4 g/dL (ref 6.4–8.3)

## 2016-07-03 MED ORDER — SODIUM CHLORIDE 0.9 % IV SOLN
Freq: Once | INTRAVENOUS | Status: AC
  Administered 2016-07-03: 16:00:00 via INTRAVENOUS

## 2016-07-03 MED ORDER — HEPARIN SOD (PORK) LOCK FLUSH 100 UNIT/ML IV SOLN
500.0000 [IU] | Freq: Once | INTRAVENOUS | Status: AC | PRN
  Administered 2016-07-03: 500 [IU]
  Filled 2016-07-03: qty 5

## 2016-07-03 MED ORDER — SODIUM CHLORIDE 0.9 % IJ SOLN
10.0000 mL | INTRAMUSCULAR | Status: DC | PRN
  Administered 2016-07-03: 10 mL
  Filled 2016-07-03: qty 10

## 2016-07-03 MED ORDER — SODIUM CHLORIDE 0.9 % IJ SOLN
10.0000 mL | INTRAMUSCULAR | Status: DC | PRN
  Administered 2016-07-03: 10 mL via INTRAVENOUS
  Filled 2016-07-03: qty 10

## 2016-07-03 MED ORDER — HEPARIN SOD (PORK) LOCK FLUSH 100 UNIT/ML IV SOLN
INTRAVENOUS | Status: AC
  Filled 2016-07-03: qty 5

## 2016-07-03 MED ORDER — IOPAMIDOL (ISOVUE-300) INJECTION 61%
75.0000 mL | Freq: Once | INTRAVENOUS | Status: AC | PRN
  Administered 2016-07-03: 75 mL via INTRAVENOUS

## 2016-07-03 MED ORDER — IOPAMIDOL (ISOVUE-300) INJECTION 61%
INTRAVENOUS | Status: AC
  Filled 2016-07-03: qty 75

## 2016-07-03 MED ORDER — TRIAMCINOLONE ACETONIDE 0.025 % EX OINT: 1.0000 "application " | TOPICAL_OINTMENT | Freq: Two times a day (BID) | CUTANEOUS | 1 refills | Status: DC

## 2016-07-03 MED ORDER — SODIUM CHLORIDE 0.9 % IJ SOLN
INTRAMUSCULAR | Status: AC
  Filled 2016-07-03: qty 50

## 2016-07-03 MED ORDER — TRASTUZUMAB CHEMO 150 MG IV SOLR
6.0000 mg/kg | Freq: Once | INTRAVENOUS | Status: AC
  Administered 2016-07-03: 609 mg via INTRAVENOUS
  Filled 2016-07-03: qty 29

## 2016-07-03 NOTE — Telephone Encounter (Signed)
Per LOS I have scheduled appts and notified the scheduler 

## 2016-07-03 NOTE — Progress Notes (Addendum)
[facility] Follow up note   Patient Care Team: Pcp Not In System as PCP - General [name], DO (Family Medicine) Holley Bouche, NP as Nurse Practitioner (Nurse Practitioner) Stark Klein, MD as Consulting Physician (General Surgery) Arloa Koh, MD as Consulting Physician (Radiation Oncology) [name], MD as Consulting Physician (Hematology)  CHIEF COMPLAINTS Follow up metastatic breast cancer  Oncology History   Breast cancer 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   Staging form: Breast, AJCC 7th Edition     Clinical stage from 07/22/2014: Stage IV (T3, N1, M1) - Unsigned       Breast cancer 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 ([facility])   07/02/2014 Mammogram    Mammogram showed a 2cm right beast mass and a 1.8cm right axillary node. MRI breast on 07/16/2014 showed 7cm R breast lesion and 4.4cm r axillary node       07/02/2014 Imaging    CT CAP: a 4.7cm mass in LUL lung and a 2.1cm mas in RML, and a small nodule in RUL, suspecious for metastasis        07/09/2014 Initial Diagnosis    right IDA with b/l lung lesions, ER-/PR-/HER2+      07/09/2014 Initial Biopsy    US guided right breast mass and axillary node biopsy showed IDA, and DCIS, ER-/PR-/HER2+      07/26/2014 Pathologic Stage    Left lung mass by IR, path revealed high grade carcinoma, morphology similar to breast tumor biopsy, TTF(-), NapsinA(-), ER(-)      08/04/2014 - 03/22/2015 Chemotherapy    weekly Paclitaxel 23m/m2, trastuzumab and pertuzumab every 3 weeks      10/04/2014 Imaging    Interval decrease in the right axillary lymphadenopathy. Bilateral pulmonary lesions with left hilar lymphadenopathy also markedly decreased in the interval. The left hilar lymphadenopathy has resolved.      12/20/2014 Imaging    restaging CT showed stable disease, no new lesions       03/29/2015 Pathology Results     right breast lumpectomy showed  chemotherapy treatment effect,  a 1 mm residual tumor,    margins were widely negative, 5 sentinel lymph nodes and 2 axillary lymph nodes were negative.       03/29/2015 Surgery     right breast lumpectomy and sentinel lymph node biopsy  by Dr. BBarry Dienes     04/12/2015 -  Chemotherapy     Herceptin maintenance therapy , 6 mg/kg, every 3 weeks      05/03/2015 - 06/14/2015 Radiation Therapy    right breast adjuvant irradiation by Dr. MValere Dross       CURRENT THERAPY: Herceptin maintenance therapy every 3 weeks, started on 04/12/2015  INTERVAL HISTROY   NPennereturns for follow-up and Herceptin treatment. She is doing well overall. Her main complaint is still her sinus congestion, and a mild dry cough in the morning and night. No fever or significant discharge. No dyspnea, or other symptoms. Her headaches has improved overall, it is mild and intermittent, likely related to her sinus issue. No other complaints.  MEDICAL HISTORY:  Past Medical History:  Diagnosis Date  . Breast cancer ([facility]   . Breast cancer ([facility]   . GERD (gastroesophageal reflux disease)    during pregnancy   . Pneumonia    hx of pneumonia 08/2013   . S/P radiation therapy 05/03/2015 through 06/14/2015    Right breast 4680 cGy in 26 sessions, right breast boost 1000 cGy in 5 sessions. Right supraclavicular/axillary region 4680 cGy  with a supplemental PA field to bring the axillary dose up to 4500 cGy in 26 sessions    SURGICAL HISTORY: Past Surgical History:  Procedure Laterality Date  . BREAST LUMPECTOMY WITH RADIOACTIVE SEED AND SENTINEL LYMPH NODE BIOPSY Right 03/29/2015   Procedure: BREAST LUMPECTOMY WITH RADIOACTIVE SEED AND SENTINEL LYMPH NODE BIOPSY;  Surgeon: Stark Klein, MD;  Location: [facility];  Service: General;  Laterality: Right;  . CESAREAN SECTION    . CHOLECYSTECTOMY    . ESSURE TUBAL LIGATION    . PORTACATH PLACEMENT Left 07/21/2014   Procedure: INSERTION PORT-A-CATH;  Surgeon: Stark Klein,  MD;  Location: [facility];  Service: General;  Laterality: Left;  . WISDOM TOOTH EXTRACTION      SOCIAL HISTORY: History   Social History  . Marital Status: Married    Spouse Name: N/A    Number of Children:  she has 2 daughters at the age of 74 and 16.   . Years of Education: N/A   Occupational History  .  works as Freight forwarder for a [employer].    Social History Main Topics  . Smoking status: Never Smoker   . Smokeless tobacco: Not on file  . Alcohol Use: Yes  . Drug Use: No  . Sexual Activity: No    FAMILY HISTORY: Family History  Problem Relation Age of Onset  . Breast cancer Mother 23  . Liver cancer Father 32  . Breast cancer Maternal Aunt 36  . Prostate cancer Maternal Grandfather   . Liver cancer Paternal Grandmother   . Prostate cancer Paternal Grandfather      GENETICS: 08/30/2014 BreastNext panel was negative. 17 genes including BRCA1, BRCA2, were negative for mutations.   ALLERGIES:  is allergic to adhesive [tape]; aspirin; and codeine.  MEDICATIONS:  Current Outpatient Prescriptions  Medication Sig Dispense Refill  . albuterol (PROVENTIL HFA;VENTOLIN HFA) 108 (90 Base) MCG/ACT inhaler Inhale 2 puffs into the lungs every 6 (six) hours as needed for wheezing or shortness of breath. 1 Inhaler 2  . Calcium-Phosphorus-Vitamin D (CALCIUM GUMMIES PO) Take by mouth daily.    . Camphor-Eucalyptus-Menthol (VICKS VAPORUB EX) Apply 1 application topically at bedtime. Applies under nose, throat and on chest.    . cetirizine (ZYRTEC) 10 MG tablet Take 10 mg by mouth daily as needed for allergies (allergies).     . clindamycin (CLINDAGEL) 1 % gel Apply topically to affected areas BID. 60 g 3  . fexofenadine (ALLEGRA) 180 MG tablet Take 180 mg by mouth daily as needed.  4  . fluticasone (FLONASE) 50 MCG/ACT nasal spray   1  . hydrocortisone 2.5 % cream APPLY TOPICALLY TO THE AFFECTED AREA TWICE DAILY 454 g 0  . lidocaine-prilocaine (EMLA) cream APPLY TOPICALLY TO THE  AFFECTED AREA AS NEEDED 30 g 0  . loperamide (IMODIUM A-D) 2 MG tablet Take 1 tablet (2 mg total) by mouth 4 (four) times daily as needed for diarrhea or loose stools. 30 tablet 0  . LORazepam (ATIVAN) 0.5 MG tablet Take 1 tablet (0.5 mg total) by mouth every 8 (eight) hours. 3 tablet 0  . metaxalone (SKELAXIN) 800 MG tablet Take 1 tablet (800 mg total) by mouth 3 (three) times daily. 30 tablet 0  . Multiple Vitamins-Minerals (MULTIVITAMIN GUMMIES ADULT PO) Take 1 each by mouth daily. Women's Vitafusion gummie    . nystatin (NYSTATIN) powder Apply topically 2 (two) times daily. 15 g 2  . Pseudoeph-Doxylamine-DM-APAP (NYQUIL PO) Take 30 mLs by mouth 2 (  two) times daily as needed (cold/allergies).     . triamcinolone (KENALOG) 0.025 % ointment Apply 1 application topically 2 (two) times daily. 30 g 1   No current facility-administered medications for this visit.    Facility-Administered Medications Ordered in Other Visits  Medication Dose Route Frequency Provider Last Rate Last Dose  . acetaminophen (TYLENOL) tablet 650 mg  650 mg Oral Once [name], MD      . heparin lock flush 100 UNIT/ML injection           . iopamidol (ISOVUE-300) 61 % injection           . sodium chloride 0.9 % injection 10 mL  10 mL Intracatheter PRN [name], MD   10 mL at 05/01/16 1622  . sodium chloride 0.9 % injection 10 mL  10 mL Intracatheter PRN [name], MD   10 mL at 07/03/16 1632  . sodium chloride 0.9 % injection             REVIEW OF SYSTEMS:   Constitutional: Denies fevers, chills or abnormal night sweats Eyes: Denies blurriness of vision, double vision or watery eyes Ears, nose, mouth, throat, and face: Denies mucositis or sore throat Respiratory: Occasional dry cough, no dyspnea or wheezes Cardiovascular: Denies palpitation, chest discomfort or lower extremity swelling Gastrointestinal:  Denies nausea, heartburn or change in bowel habits Skin:(+)  skin pigmentation on bilateral forearms and left elbow,  much improved Lymphatics: Denies new lymphadenopathy or easy bruising Neurological:Denies numbness, tingling or new weaknesses Behavioral/Psych: Mood is stable, no new changes  All other systems were reviewed with the patient and are negative.  PHYSICAL EXAMINATION: ECOG PERFORMANCE STATUS: 0 BP (!) 152/72 (BP Location: Right Arm, Patient Position: Sitting)   Pulse 81   Temp 98.6 F (37 C) (Oral)   Resp 17   Ht 5' 10"  (1.778 m)   Wt 238 lb 14.4 oz (108.4 kg)   LMP 06/19/2016   SpO2 99%   BMI 34.28 kg/m   GENERAL:alert, no distress and comfortable SKIN: skin color, texture, turgor are normal, no rashes or significant lesions EYES: normal, conjunctiva are pink and non-injected, sclera clear OROPHARYNX:no exudate, no erythema and lips, buccal mucosa, and tongue normal  NECK: supple, thyroid normal size, non-tender, without nodularity LYMPH:  no palpable lymphadenopathy in the cervical, axillary or inguinal LUNGS: clear to auscultation and percussion with normal breathing effort HEART: regular rate & rhythm and no murmurs and no lower extremity edema ABDOMEN:abdomen soft, non-tender and normal bowel sounds Musculoskeletal:no cyanosis of digits and no clubbing, no tenderness on bilateral chest wall or spine PSYCH: alert & oriented x 3 with fluent speech NEURO: no focal motor/sensory deficits Breasts:  Status post right breast lumpectomy and sentinel lymph node biopsy, incisions have healed well. The previous palpable seroma has resolved. (+) mild skin pigmentation of right breast secondary to radiation. The skin peeling underneath the right breast has healed well. Palpation of both breasts  and axillas revealed no other obvious mass that I could appreciate. Skin: No skin rashes.   LABORATORY DATA:  I have reviewed the data as listed CBC Latest Ref Rng & Units 07/03/2016 05/22/2016 05/01/2016  WBC 3.9 - 10.3 10e3/uL 7.3 9.3 6.1  Hemoglobin 11.6 - 15.9 g/dL 11.7 12.9 12.1  Hematocrit  34.8 - 46.6 % 34.9 40.0 36.5  Platelets 145 - 400 10e3/uL 216 237 223    CMP Latest Ref Rng & Units 07/03/2016 05/22/2016 05/01/2016  Glucose 70 - 140 mg/dl 94 128  92  BUN 7.0 - 26.0 mg/dL 9.6 20.2 14.2  Creatinine 0.6 - 1.1 mg/dL 0.8 1.0 0.8  Sodium 136 - 145 mEq/L 139 142 140  Potassium 3.5 - 5.1 mEq/L 3.6 3.9 3.9  Chloride 101 - 111 mmol/L - - -  CO2 22 - 29 mEq/L 26 27 26   Calcium 8.4 - 10.4 mg/dL 9.3 9.5 9.3  Total Protein 6.4 - 8.3 g/dL 7.4 7.8 7.9  Total Bilirubin 0.20 - 1.20 mg/dL 0.56 0.30 0.35  Alkaline Phos 40 - 150 U/L 75 89 90  AST 5 - 34 U/L 21 21 20   ALT 0 - 55 U/L 25 32 27    PATHOLOGY REPORT 07/09/2014 #1 breast, right needle core biopsy more anterior -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 45)  Lung, biopsy, Left 07/26/2014 - HIGH GRADE CARCINOMA, SEE COMMENT. Microscopic Comment The carcinoma demonstrates the following immunophenotype: TTF-1 - negative expression. Napsin A- negative expression. CK5/6 - focal, moderate expression. estrogen receptor - negative expression. GCDFP- negative expression. The recent breast biopsy demonstrating Her2 amplified high grade invasive mammary carcinoma is noted ([id number]). Although the immunophenotype of the current case is non-sepcific, it strongly argues against primary lung adenocarcinoma. However, on re-review, the morphology of the current case is essentially identical to the primary mammary carcinoma. In lieu of further immunophenotyping, the tumor will be submitted for Her2 testing and the remaining tissue will be reserved for additional ancillary tumor testing. The results of the Her2 testing will be reported in an addendum. The case was discussed with Dr Burr Medico on 07/28/2015 and  07/29/2015 HER2 POSITIVE   Diagnosis 03/29/2015 1. Breast, lumpectomy, Right - INVASIVE DUCTAL CARCINOMA, SEE COMMENT. - SEE TUMOR SYNOPTIC TEMPLATE BELOW. 2. Lymph node, sentinel, biopsy, Right axillary #1 - ONE LYMPH NODE, NEGATIVE FOR TUMOR (0/1). - SEE COMMENT. 3. Lymph node, sentinel, biopsy, Right axillary #2 - BENIGN FIBROFATTY SOFT TISSUE. - NEGATIVE FOR ATYPIA OR MALIGNANCY. - NEGATIVE FOR LYMPH NODE. 4. Lymph node, sentinel, biopsy, Right axillary #3 - ONE LYMPH NODE, NEGATIVE FOR TUMOR (0/1). - SEE COMMENT. 5. Lymph node, sentinel, biopsy, Right axillary #4 - ONE LYMPH NODE, NEGATIVE FOR TUMOR (0/1). - SEE COMMENT. 6. Lymph node, sentinel, biopsy, Right axillary #5 - ONE LYMPH NODE, NEGATIVE FOR TUMOR (0/1). - SEE COMMENT. 7. Lymph node, sentinel, biopsy, right axillary - ONE LYMPH NODE, NEGATIVE FOR TUMOR (0/1). - SEE COMMENT. 8. Lymph node, sentinel, biopsy, right axillary - ONE LYMPH NODE, NEGATIVE FOR TUMOR (0/1). - SEE COMMENT. 1 of 4 Amended copy Amended FINAL for SHONDRA, CAPPS ([id number]) Microscopic Comment 1. BREAST, INVASIVE TUMOR, WITH LYMPH NODES PRESENT Specimen, including laterality and lymph node sampling (sentinel, non-sentinel): Right breast with sentinel and non-sentinel lymph node sampling Procedure: Lumpectomy Histologic type: Ductal, see comment Grade: 2 of 3, see comment. Tubule formation: 3 Nuclear pleomorphism: 3 Mitotic: 1 Tumor size (glass slide measurement): 1 mm, see comment Margins: Invasive, distance to closest margin: See comment In-situ, distance to closest margin: N/A If margin positive, focally or broadly: N/A Lymphovascular invasion: Absent Ductal carcinoma in situ: Absent Grade: N/A Extensive intraductal component: N/A Lobular neoplasia: Absent Tumor focality: Unifocal, see comment Treatment effect: Present If present, treatment effect in breast tissue, lymph nodes or both: Both breast tissue and lymph  nodes Extent of tumor: Skin: N/A Nipple:  N/A Skeletal muscle: N/A Lymph nodes: Examined: 5 Sentinel 2 Non-sentinel 7 Total Lymph nodes with metastasis: 0 Isolated tumor cells (< 0.2 mm): N/A Micrometastasis: (> 0.2 mm and < 2.0 mm): N/A Macrometastasis: (> 2.0 mm): N/A Extracapsular extension: N/A Breast prognostic profile: Estrogen receptor: Not repeated, previous study demonstrated 0% positivity ([id number]). Progesterone receptor: Not repeated, previous study demonstrated 0% positivity ([id number]) Her 2 neu: Demonstrates amplificaition ([id number]) Ki-67: Not repeated, previous study demonstrate 79% proliferation rate ([id number]). Non-neoplastic breast: Neoadjuvant related tissue changes and adenosis. TNM: ypT52m, ypN0, pMX Comments: Numerous representative sections including the 2.0 cm hemorrhagic tissue associated with X-shaped biopsy clip, the 2.0 cm ill defined lesion associated with M-shaped clip, and intervening tissue were submitted for review. Slide sections from the tissue surrounding the X-shaped and M-shaped clips demonstrate tissue changes consistent with neoadjuvant related change. There is no invasive or in situ carcinom present at either site. However, in a representative section from the intervening tissue (slide G), there is a 1 mm focus of high grade invasive ductal carcinoma present. Given the minute size of the tumor, tumor grading is limited. The tumor is present within non-marginal tissue sections and is considered to be at least 0.5 cm from the nearest margin (medial).   Note: Amendment issued for modification of synoptic table, inadvertent typographical error. The change in the synoptic table was discussed with Dr FBurr Medicoon 04/07/2015. (CRR:ecj 04/07/2015) 2. , 4-8. In parts 2 and 4, there is extensive neoadjuvant related tissue changes including abundant foamy macrophages, fibroinflammatory reaction and dystrophic calcifications. The neoadjuvant tissue  changes extend into perinodal soft tissue and are either focally or broadly present at the cauterized edge of the tissue submitted. There are no definitive features of malignancy present.  RADIOGRAPHIC STUDIES:  CT chest, abdomen and pelvis with contrast 05/17/2016 IMPRESSION: 1. Stable appearance of the chest, abdomen, and pelvis, with bandlike density with very minimal nodularity in the left upper lobe at the site of previously treated metastatic disease unchanged from prior. No findings of new metastatic disease. 2. Mild airway thickening and minimal airway plugging in the right lower lobe. 3. Other imaging findings of potential clinical significance: Mild thoracolumbar scoliosis. Diffuse hepatic steatosis. 4 mm right kidney lower pole nonobstructive calculus. Prominent stool throughout the colon favors constipation.   Bone scan 02/23/2016 IMPRESSION: No definite evidence of osseous metastatic disease.  Subtle increased uptake in the posterior lateral aspect of the right eighth rib merits further evaluation with a right rib x-ray series.  CT head w wo contrast 07/03/2016 IMPRESSION: No CT findings of intracranial metastasis though, MRI of the brain with contrast would be more sensitive for subtle abnormalities.  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  ECHO 05/22/2016 Study Conclusions  - Left ventricle: The cavity size was normal. Wall thickness was   normal. Systolic function was normal. The estimated ejection   fraction was in the range of 60% to 65%. Wall motion was normal;   there were no regional wall motion abnormalities. Features are   consistent with a pseudonormal left ventricular filling pattern,   with concomitant abnormal relaxation and increased filling   pressure (grade 2 diastolic  dysfunction). - Mitral valve: There was mild regurgitation.   ASSESSMENT & PLAN:  45year old African-American female, premenopausal, without significant past medical history, presented with palpable right breast mass and right axillary mass.   1. 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 - I reviewed her breasts surgical pathology results with her in great detail ,  She has had fantastic partial response (near complete response) form 8 months of chemotherapy and antibody therapy.   The initial 6.3 cm breast mass has only 1 mm residual tumor,  And the previously 4.4 cm right axillary lymph node has had complete  Pathological response, all 7 nodes were negative for cancer. -I reviewed her restaging CT and bone scans from 05/17/2016, which showed no evidence of disease -She is clinically doing very well, labs are normal, no clinical concern for disease progression or recurrence. -He was not able to tolerate MRI, which was changed to CT head with and without contrast. CT scan showed no evidence of metastasis, I reviewed the results with her. -Continue monitoring cardiac echo every 4-6 months, she will follow up with Dr. BHaroldine Laws -She is clinically doing well, lab results reviewed, normal CBC and CMP, exam is unremarkable, she remains to be no evidence of disease. We'll continue Herceptin every 3 weeks indefinitely until disease progression.  -next restaging CT in Jan or Feb  -repeat mammogram   2. Right lateral chest pain -near resolved now, likely muscular pain. -Her recent bone scan showed subtle uptake in the lateral right eighth rib, corresponding to her chest pain. However there is no significant bone lesion or fracture on the CT scan.  3. Mild peripheral neuropathy,  G1 -Probably related to her prior chemotherapy -Very mild, we'll continue monitoring.   4. Sinusitis and mild headache -She has been treated for sinus infection several times lately, with antibiotics and steroids -Her symptoms overall improved, we'll continue allergy medication and observation   Plan for today  -CT head scan reviewed -Continue Herceptin every 3 weeks, treatment today. -RTC in 6 weeks for follow up  -mammogram in the next month   I spent about 20 minutes counseling the patient and her family members, total care was about 25 minutes.  FTruitt Merle 07/03/2016

## 2016-07-03 NOTE — Telephone Encounter (Signed)
Appointments scheduled per 11/21 LOS. Patient given AVS report and calendars with future scheduled appointments.

## 2016-07-03 NOTE — Patient Instructions (Signed)
[facility] Discharge Instructions for Patients Receiving Chemotherapy  Today you received the following chemotherapy agents: Herceptin   To help prevent nausea and vomiting after your treatment, we encourage you to take your nausea medication as directed.    If you develop nausea and vomiting that is not controlled by your nausea medication, call the clinic.   BELOW ARE SYMPTOMS THAT SHOULD BE REPORTED IMMEDIATELY:  *FEVER GREATER THAN 100.5 F  *CHILLS WITH OR WITHOUT FEVER  NAUSEA AND VOMITING THAT IS NOT CONTROLLED WITH YOUR NAUSEA MEDICATION  *UNUSUAL SHORTNESS OF BREATH  *UNUSUAL BRUISING OR BLEEDING  TENDERNESS IN MOUTH AND THROAT WITH OR WITHOUT PRESENCE OF ULCERS  *URINARY PROBLEMS  *BOWEL PROBLEMS  UNUSUAL RASH Items with * indicate a potential emergency and should be followed up as soon as possible.  Feel free to call the clinic you have any questions or concerns. The clinic phone number is [phone].  Please show the CHEMO ALERT CARD at check-in to the Emergency Department and triage nurse.   

## 2016-07-24 ENCOUNTER — Ambulatory Visit (HOSPITAL_BASED_OUTPATIENT_CLINIC_OR_DEPARTMENT_OTHER): Payer: 59

## 2016-07-24 VITALS — BP 154/89 | HR 94 | Temp 98.7°F | Resp 20

## 2016-07-24 DIAGNOSIS — C7800 Secondary malignant neoplasm of unspecified lung: Principal | ICD-10-CM

## 2016-07-24 DIAGNOSIS — C50911 Malignant neoplasm of unspecified site of right female breast: Secondary | ICD-10-CM | POA: Diagnosis not present

## 2016-07-24 DIAGNOSIS — C7802 Secondary malignant neoplasm of left lung: Secondary | ICD-10-CM

## 2016-07-24 DIAGNOSIS — Z5112 Encounter for antineoplastic immunotherapy: Secondary | ICD-10-CM

## 2016-07-24 MED ORDER — ACETAMINOPHEN 325 MG PO TABS: 650.0000 mg | ORAL_TABLET | Freq: Once | ORAL | Status: DC

## 2016-07-24 MED ORDER — SODIUM CHLORIDE 0.9 % IV SOLN
Freq: Once | INTRAVENOUS | Status: AC
  Administered 2016-07-24: 17:00:00 via INTRAVENOUS

## 2016-07-24 MED ORDER — TRASTUZUMAB CHEMO 150 MG IV SOLR
6.0000 mg/kg | Freq: Once | INTRAVENOUS | Status: AC
  Administered 2016-07-24: 609 mg via INTRAVENOUS
  Filled 2016-07-24: qty 29

## 2016-07-24 MED ORDER — SODIUM CHLORIDE 0.9 % IJ SOLN
10.0000 mL | INTRAMUSCULAR | Status: DC | PRN
  Administered 2016-07-24: 10 mL
  Filled 2016-07-24: qty 10

## 2016-07-24 MED ORDER — HEPARIN SOD (PORK) LOCK FLUSH 100 UNIT/ML IV SOLN
500.0000 [IU] | Freq: Once | INTRAVENOUS | Status: AC | PRN
  Administered 2016-07-24: 500 [IU]
  Filled 2016-07-24: qty 5

## 2016-07-24 NOTE — Patient Instructions (Signed)
[facility] Discharge Instructions for Patients Receiving Chemotherapy  Today you received the following chemotherapy agents: Herceptin   To help prevent nausea and vomiting after your treatment, we encourage you to take your nausea medication as directed.    If you develop nausea and vomiting that is not controlled by your nausea medication, call the clinic.   BELOW ARE SYMPTOMS THAT SHOULD BE REPORTED IMMEDIATELY:  *FEVER GREATER THAN 100.5 F  *CHILLS WITH OR WITHOUT FEVER  NAUSEA AND VOMITING THAT IS NOT CONTROLLED WITH YOUR NAUSEA MEDICATION  *UNUSUAL SHORTNESS OF BREATH  *UNUSUAL BRUISING OR BLEEDING  TENDERNESS IN MOUTH AND THROAT WITH OR WITHOUT PRESENCE OF ULCERS  *URINARY PROBLEMS  *BOWEL PROBLEMS  UNUSUAL RASH Items with * indicate a potential emergency and should be followed up as soon as possible.  Feel free to call the clinic you have any questions or concerns. The clinic phone number is [phone].  Please show the CHEMO ALERT CARD at check-in to the Emergency Department and triage nurse.   

## 2016-08-09 ENCOUNTER — Encounter (HOSPITAL_BASED_OUTPATIENT_CLINIC_OR_DEPARTMENT_OTHER): Payer: Self-pay | Admitting: *Deleted

## 2016-08-09 ENCOUNTER — Emergency Department (HOSPITAL_BASED_OUTPATIENT_CLINIC_OR_DEPARTMENT_OTHER)
Admission: EM | Admit: 2016-08-09 | Discharge: 2016-08-09 | Disposition: A | Discharge status: Home / Self Care | Payer: 59 | Attending: Emergency Medicine | Admitting: Emergency Medicine

## 2016-08-09 DIAGNOSIS — J40 Bronchitis, not specified as acute or chronic: Secondary | ICD-10-CM | POA: Insufficient documentation

## 2016-08-09 DIAGNOSIS — Z79899 Other long term (current) drug therapy: Secondary | ICD-10-CM | POA: Insufficient documentation

## 2016-08-09 DIAGNOSIS — Z853 Personal history of malignant neoplasm of breast: Secondary | ICD-10-CM | POA: Diagnosis not present

## 2016-08-09 DIAGNOSIS — R05 Cough: Secondary | ICD-10-CM | POA: Diagnosis present

## 2016-08-09 MED ORDER — AZITHROMYCIN 250 MG PO TABS: 250.0000 mg | ORAL_TABLET | Freq: Every day | ORAL | 0 refills | Status: DC

## 2016-08-09 MED ORDER — FLUCONAZOLE 200 MG PO TABS: 200.0000 mg | ORAL_TABLET | Freq: Every day | ORAL | 0 refills | Status: DC

## 2016-08-09 MED ORDER — DEXAMETHASONE 4 MG PO TABS
4.0000 mg | ORAL_TABLET | Freq: Two times a day (BID) | ORAL | 0 refills | Status: DC
Start: 2016-08-09 — End: 2016-09-04

## 2016-08-09 MED ORDER — GUAIFENESIN 100 MG/5ML PO SYRP: 100.0000 mg | ORAL_SOLUTION | ORAL | 0 refills | Status: DC | PRN

## 2016-08-09 MED FILL — DEXAMETHASONE 4 MG TABLET: 4 | 4 days supply | Qty: 8 | Fill #0

## 2016-08-09 MED FILL — ROBAFEN 100 MG/5 ML SYRUP: 100 | 2 days supply | Qty: 118 | Fill #0

## 2016-08-09 MED FILL — FLUCONAZOLE 200 MG TABLET: 200 | 5 days supply | Qty: 2 | Fill #0

## 2016-08-09 MED FILL — AZITHROMYCIN 250 MG TABLET: 250 | 5 days supply | Qty: 6 | Fill #0

## 2016-08-09 NOTE — ED Provider Notes (Signed)
[facility] Provider Note   CSN: [id number] Arrival date & time: 08/09/16  4431     History   Chief Complaint Chief Complaint  Patient presents with  . Cough    HPI [name] [name] is a 45 y.o. female.  HPI  45yF with cough. Onset about 1.5 weeks ago. Dry cough. Persistent since then and becoming productive over the last couple days. Also now with sore throat. No fever or chills. No sob. No unusual leg pain or swelling. 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 Has been taking nyquil w/o much improvement.   Past Medical History:  Diagnosis Date  . Breast cancer ([place])   . Breast cancer ([place])   . GERD (gastroesophageal reflux disease)    during pregnancy   . Pneumonia    hx of pneumonia 08/2013   . S/P radiation therapy 05/03/2015 through 06/14/2015    Right breast 4680 cGy in 26 sessions, right breast boost 1000 cGy in 5 sessions. Right supraclavicular/axillary region 4680 cGy with a supplemental PA field to bring the axillary dose up to 4500 cGy in 26 sessions    Patient Active Problem List   Diagnosis Date Noted  . Sinusitis 07/03/2016  . Candidiasis of breast 04/05/2016  . Eczema 04/05/2016  . Port catheter in place 12/06/2015  . Seasonal allergies 10/04/2015  . 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 Breast cancer 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 ([place]) 07/22/2014  . Family history of breast cancer 07/22/2014  . 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 BREAST LUMPECTOMY WITH RADIOACTIVE SEED AND SENTINEL LYMPH NODE BIOPSY Right 03/29/2015   Procedure: BREAST LUMPECTOMY WITH RADIOACTIVE SEED AND SENTINEL LYMPH NODE BIOPSY;  Surgeon: Stark Klein, MD;  Location: [facility];  Service: General;  Laterality: Right;  . CESAREAN SECTION    . CHOLECYSTECTOMY    . ESSURE TUBAL LIGATION    . PORTACATH  PLACEMENT Left 07/21/2014   Procedure: INSERTION PORT-A-CATH;  Surgeon: Stark Klein, MD;  Location: [facility];  Service: General;  Laterality: Left;  . WISDOM TOOTH EXTRACTION      OB History    Gravida Para Term Preterm AB Living   3 2           SAB TAB Ectopic Multiple Live Births                  Obstetric Comments   Menarche age 56, BC x 65, G2, P2, last menstrual cycle 08/15/14       Home Medications    Prior to Admission medications   Medication Sig Start Date End Date Taking? Authorizing Provider  albuterol (PROVENTIL HFA;VENTOLIN HFA) 108 (90 Base) MCG/ACT inhaler Inhale 2 puffs into the lungs every 6 (six) hours as needed for wheezing or shortness of breath. 09/13/15   [name], MD  azithromycin (ZITHROMAX Z-PAK) 250 MG tablet Take 1 tablet (250 mg total) by mouth daily. 2 pills ('500mg'$ ) day 1 then 1 pill ('250mg'$ ) days 2-5. 08/09/16   Virgel Manifold, MD  Calcium-Phosphorus-Vitamin D (CALCIUM GUMMIES PO) Take by mouth daily.    Historical Provider, MD  Camphor-Eucalyptus-Menthol (VICKS VAPORUB EX) Apply 1 application topically at bedtime. Applies under nose, throat and on chest.    Historical Provider, MD  cetirizine (ZYRTEC) 10 MG tablet Take 10 mg by mouth daily as needed for allergies (allergies).     Historical Provider, MD  clindamycin (CLINDAGEL) 1 % gel Apply topically to affected areas BID. 11/16/14   [name], MD  dexamethasone (DECADRON) 4 MG tablet Take 1 tablet (4 mg total) by mouth 2 (two) times daily. 08/09/16   Virgel Manifold, MD  fexofenadine (ALLEGRA) 180 MG tablet Take 180 mg by mouth daily as needed. 12/13/15   Historical Provider, MD  fluconazole (DIFLUCAN) 200 MG tablet Take 1 tablet (200 mg total) by mouth daily. 1 tablet when starting antibiotics and second dose when finished. 08/09/16   Virgel Manifold, MD  fluticasone Asencion Islam) 50 MCG/ACT nasal spray  08/03/15   Historical Provider, MD  guaifenesin (ROBITUSSIN) 100 MG/5ML syrup Take 5-10 mLs (100-200 mg total) by mouth  every 4 (four) hours as needed for cough. 08/09/16   Virgel Manifold, MD  hydrocortisone 2.5 % cream APPLY TOPICALLY TO THE AFFECTED AREA TWICE DAILY 12/07/14   [name], MD  lidocaine-prilocaine (EMLA) cream APPLY TOPICALLY TO THE AFFECTED AREA AS NEEDED 05/18/16   [name], MD  loperamide (IMODIUM A-D) 2 MG tablet Take 1 tablet (2 mg total) by mouth 4 (four) times daily as needed for diarrhea or loose stools. 09/21/14   [name], MD  LORazepam (ATIVAN) 0.5 MG tablet Take 1 tablet (0.5 mg total) by mouth every 8 (eight) hours. 05/22/16   [name], MD  metaxalone (SKELAXIN) 800 MG tablet Take 1 tablet (800 mg total) by mouth 3 (three) times daily. 12/27/15   [name], MD  Multiple Vitamins-Minerals (MULTIVITAMIN GUMMIES ADULT PO) Take 1 each by mouth daily. Women's Vitafusion gummie    Historical Provider, MD  nystatin (NYSTATIN) powder Apply topically 2 (two) times daily. 04/10/16   [name], MD  Pseudoeph-Doxylamine-DM-APAP (NYQUIL PO) Take 30 mLs by mouth 2 (two) times daily as needed (cold/allergies).     Historical Provider, MD  triamcinolone (KENALOG) 0.025 % ointment Apply 1 application topically 2 (two) times daily. 07/03/16   [name], MD    Family History Family History  Problem Relation Age of Onset  . Breast cancer Mother 55  . Liver cancer Father   . Breast cancer Maternal Aunt 67  . Breast cancer Maternal Aunt 10  . Prostate cancer Maternal Grandfather 20  . Liver cancer Paternal Grandmother   . Prostate cancer Paternal Grandfather   . Prostate cancer Maternal Uncle 73  . Prostate cancer Maternal Uncle 68  . Breast cancer Cousin 3    mat first cousin    Social History Social History  Substance Use Topics  . Smoking status: Never Smoker  . Smokeless tobacco: Never Used  . Alcohol use Yes     Comment: 2-3 drinks per week      Allergies   Adhesive [tape]; Aspirin; and Codeine   Review of Systems Review of Systems  All systems reviewed and negative, other than as noted  in HPI.    Physical Exam Updated Vital Signs BP 151/88 (BP Location: Left Arm)   Pulse 83   Temp 98.6 F (37 C) (Oral)   Resp 20   Ht '5\' 10"'$  (1.778 m)   Wt 240 lb (108.9 kg)   LMP 07/13/2016 (Exact Date)   SpO2 100%   BMI 34.44 kg/m   Physical Exam  Constitutional: She appears well-developed and well-nourished. No distress.  HENT:  Head: Normocephalic and atraumatic.  Nose: Nose normal.  Mouth/Throat: Oropharynx is clear and moist.  Eyes: Conjunctivae and EOM are normal. Pupils are equal, round, and reactive to light. Right eye exhibits no discharge.  Left eye exhibits no discharge.  Neck: Neck supple.  Cardiovascular: Normal rate, regular rhythm and normal heart sounds.  Exam reveals no gallop and no friction rub.   No murmur heard. Pulmonary/Chest: Effort normal and breath sounds normal. No respiratory distress.  Abdominal: Soft. She exhibits no distension. There is no tenderness.  Musculoskeletal: She exhibits no edema or tenderness.  Lower extremities symmetric as compared to each other. No calf tenderness. Negative Homan's. No palpable cords.   Neurological: She is alert.  Skin: Skin is warm and dry.  Psychiatric: She has a normal mood and affect. Her behavior is normal. Thought content normal.  Nursing note and vitals reviewed.    ED Treatments / Results  Labs (all labs ordered are listed, but only abnormal results are displayed) Labs Reviewed - No data to display  EKG  EKG Interpretation None       Radiology No results found.  Procedures Procedures (including critical care time)  Medications Ordered in ED Medications - No data to display   Initial Impression / Assessment and Plan / ED Course  I have reviewed the triage vital signs and the nursing notes.  Pertinent labs & imaging results that were available during my care of the patient were reviewed by me and considered in my medical decision making (see chart for details).  Clinical Course      45yF with cough and sore throat. Likely viral bronchitis. I think bacterial infection is less likely, but with hx of CA with active tx I think a course of abx is reasonable. Consider PE, but doubt. Denies dyspnea. Not hypoxic ot tachycardic. Steroids. She is requesting diflucan with abx. PRN cough meds.   It has been determined that no acute conditions requiring further emergency intervention are present at this time. The patient has been advised of the diagnosis and plan. I reviewed any labs and imaging including any potential incidental findings. We have discussed signs and symptoms that warrant return to the ED and they are listed in the discharge instructions.    Final Clinical Impressions(s) / ED Diagnoses   Final diagnoses:  Bronchitis    New Prescriptions New Prescriptions   AZITHROMYCIN (ZITHROMAX Z-PAK) 250 MG TABLET    Take 1 tablet (250 mg total) by mouth daily. 2 pills ('500mg'$ ) day 1 then 1 pill ('250mg'$ ) days 2-5.   DEXAMETHASONE (DECADRON) 4 MG TABLET    Take 1 tablet (4 mg total) by mouth 2 (two) times daily.   FLUCONAZOLE (DIFLUCAN) 200 MG TABLET    Take 1 tablet (200 mg total) by mouth daily. 1 tablet when starting antibiotics and second dose when finished.   GUAIFENESIN (ROBITUSSIN) 100 MG/5ML SYRUP    Take 5-10 mLs (100-200 mg total) by mouth every 4 (four) hours as needed for cough.     Virgel Manifold, MD 08/09/16 [phone]

## 2016-08-09 NOTE — ED Triage Notes (Signed)
Patient states she has a 1.5 week history of dry cough and sore throat.  States over the last 4-5 days the cough has become productive with sinus congestion.  Describes her secretions as yellow and green.  Denies fever.

## 2016-08-14 ENCOUNTER — Ambulatory Visit (HOSPITAL_BASED_OUTPATIENT_CLINIC_OR_DEPARTMENT_OTHER): Payer: 59 | Admitting: Hematology

## 2016-08-14 ENCOUNTER — Other Ambulatory Visit (HOSPITAL_BASED_OUTPATIENT_CLINIC_OR_DEPARTMENT_OTHER): Payer: 59

## 2016-08-14 ENCOUNTER — Ambulatory Visit: Payer: 59

## 2016-08-14 ENCOUNTER — Encounter: Payer: Self-pay | Admitting: Hematology

## 2016-08-14 ENCOUNTER — Ambulatory Visit (HOSPITAL_BASED_OUTPATIENT_CLINIC_OR_DEPARTMENT_OTHER): Payer: 59

## 2016-08-14 VITALS — BP 160/83 | HR 109 | Temp 98.3°F | Resp 18 | Ht 70.0 in | Wt 239.3 lb

## 2016-08-14 DIAGNOSIS — R05 Cough: Secondary | ICD-10-CM

## 2016-08-14 DIAGNOSIS — C7802 Secondary malignant neoplasm of left lung: Secondary | ICD-10-CM | POA: Diagnosis not present

## 2016-08-14 DIAGNOSIS — G620 Drug-induced polyneuropathy: Secondary | ICD-10-CM

## 2016-08-14 DIAGNOSIS — C7800 Secondary malignant neoplasm of unspecified lung: Principal | ICD-10-CM

## 2016-08-14 DIAGNOSIS — J321 Chronic frontal sinusitis: Secondary | ICD-10-CM

## 2016-08-14 DIAGNOSIS — C50911 Malignant neoplasm of unspecified site of right female breast: Secondary | ICD-10-CM

## 2016-08-14 DIAGNOSIS — T451X5A Adverse effect of antineoplastic and immunosuppressive drugs, initial encounter: Secondary | ICD-10-CM

## 2016-08-14 DIAGNOSIS — Z95828 Presence of other vascular implants and grafts: Secondary | ICD-10-CM

## 2016-08-14 DIAGNOSIS — Z171 Estrogen receptor negative status [ER-]: Secondary | ICD-10-CM | POA: Diagnosis not present

## 2016-08-14 DIAGNOSIS — Z5112 Encounter for antineoplastic immunotherapy: Secondary | ICD-10-CM | POA: Diagnosis not present

## 2016-08-14 LAB — CBC WITH DIFFERENTIAL/PLATELET
BASO%: 0 % (ref 0.0–2.0)
Basophils Absolute: 0 10*3/uL (ref 0.0–0.1)
EOS ABS: 0 10*3/uL (ref 0.0–0.5)
EOS%: 0 % (ref 0.0–7.0)
HCT: 38.4 % (ref 34.8–46.6)
HGB: 13 g/dL (ref 11.6–15.9)
LYMPH%: 15.1 % (ref 14.0–49.7)
MCH: 29.7 pg (ref 25.1–34.0)
MCHC: 33.9 g/dL (ref 31.5–36.0)
MCV: 87.7 fL (ref 79.5–101.0)
MONO#: 0.7 10*3/uL (ref 0.1–0.9)
MONO%: 6 % (ref 0.0–14.0)
NEUT#: 8.5 10*3/uL — ABNORMAL HIGH (ref 1.5–6.5)
NEUT%: 78.9 % — ABNORMAL HIGH (ref 38.4–76.8)
Platelets: 281 10*3/uL (ref 145–400)
RBC: 4.38 10*6/uL (ref 3.70–5.45)
RDW: 13.3 % (ref 11.2–14.5)
WBC: 10.8 10*3/uL — AB (ref 3.9–10.3)
lymph#: 1.6 10*3/uL (ref 0.9–3.3)

## 2016-08-14 LAB — COMPREHENSIVE METABOLIC PANEL
ALT: 20 U/L (ref 0–55)
AST: 14 U/L (ref 5–34)
Albumin: 3.5 g/dL (ref 3.5–5.0)
Alkaline Phosphatase: 83 U/L (ref 40–150)
Anion Gap: 11 mEq/L (ref 3–11)
BUN: 22.4 mg/dL (ref 7.0–26.0)
CHLORIDE: 103 meq/L (ref 98–109)
CO2: 27 meq/L (ref 22–29)
Calcium: 9.8 mg/dL (ref 8.4–10.4)
Creatinine: 1 mg/dL (ref 0.6–1.1)
EGFR: 82 mL/min/{1.73_m2} — AB (ref >90)
GLUCOSE: 137 mg/dL (ref 70–140)
POTASSIUM: 3.7 meq/L (ref 3.5–5.1)
SODIUM: 140 meq/L (ref 136–145)
Total Bilirubin: 0.26 mg/dL (ref 0.20–1.20)
Total Protein: 7.7 g/dL (ref 6.4–8.3)

## 2016-08-14 MED ORDER — SODIUM CHLORIDE 0.9 % IV SOLN
Freq: Once | INTRAVENOUS | Status: AC
  Administered 2016-08-14: 16:00:00 via INTRAVENOUS

## 2016-08-14 MED ORDER — ACETAMINOPHEN 325 MG PO TABS: 650.0000 mg | ORAL_TABLET | Freq: Once | ORAL | Status: DC

## 2016-08-14 MED ORDER — HEPARIN SOD (PORK) LOCK FLUSH 100 UNIT/ML IV SOLN
500.0000 [IU] | Freq: Once | INTRAVENOUS | Status: AC | PRN
  Administered 2016-08-14: 500 [IU]
  Filled 2016-08-14: qty 5

## 2016-08-14 MED ORDER — SODIUM CHLORIDE 0.9 % IJ SOLN
10.0000 mL | INTRAMUSCULAR | Status: DC | PRN
  Administered 2016-08-14: 10 mL
  Filled 2016-08-14: qty 10

## 2016-08-14 MED ORDER — TRASTUZUMAB CHEMO 150 MG IV SOLR
6.0000 mg/kg | Freq: Once | INTRAVENOUS | Status: AC
  Administered 2016-08-14: 609 mg via INTRAVENOUS
  Filled 2016-08-14: qty 29

## 2016-08-14 MED ORDER — SODIUM CHLORIDE 0.9 % IJ SOLN
10.0000 mL | INTRAMUSCULAR | Status: DC | PRN
  Administered 2016-08-14: 10 mL via INTRAVENOUS
  Filled 2016-08-14: qty 10

## 2016-08-14 NOTE — Progress Notes (Signed)
[facility] Follow up note   Patient Care Team: Pcp Not In System as PCP - General [name], DO (Family Medicine) Holley Bouche, NP as Nurse Practitioner (Nurse Practitioner) Stark Klein, MD as Consulting Physician (General Surgery) Arloa Koh, MD as Consulting Physician (Radiation Oncology) [name], MD as Consulting Physician (Hematology)  CHIEF COMPLAINTS Follow up metastatic breast cancer  Oncology History   Breast cancer 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   Staging form: Breast, AJCC 7th Edition     Clinical stage from 07/22/2014: Stage IV (T3, N1, M1) - Unsigned       Breast cancer 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 ([place])   07/02/2014 Mammogram    Mammogram showed a 2cm right beast mass and a 1.8cm right axillary node. MRI breast on 07/16/2014 showed 7cm R breast lesion and 4.4cm r axillary node       07/02/2014 Imaging    CT CAP: a 4.7cm mass in LUL lung and a 2.1cm mas in RML, and a small nodule in RUL, suspecious for metastasis        07/09/2014 Initial Diagnosis    right IDA with b/l lung lesions, ER-/PR-/HER2+      07/09/2014 Initial Biopsy    US guided right breast mass and axillary node biopsy showed IDA, and DCIS, ER-/PR-/HER2+      07/26/2014 Pathologic Stage    Left lung mass by IR, path revealed high grade carcinoma, morphology similar to breast tumor biopsy, TTF(-), NapsinA(-), ER(-)      08/04/2014 - 03/22/2015 Chemotherapy    weekly Paclitaxel 21m/m2, trastuzumab and pertuzumab every 3 weeks      10/04/2014 Imaging    Interval decrease in the right axillary lymphadenopathy. Bilateral pulmonary lesions with left hilar lymphadenopathy also markedly decreased in the interval. The left hilar lymphadenopathy has resolved.      12/20/2014 Imaging    restaging CT showed stable disease, no new lesions       03/29/2015 Pathology Results     right breast lumpectomy showed  chemotherapy treatment effect,  a 1 mm residual tumor,    margins were widely negative, 5 sentinel lymph nodes and 2 axillary lymph nodes were negative.       03/29/2015 Surgery     right breast lumpectomy and sentinel lymph node biopsy  by Dr. BBarry Dienes     04/12/2015 -  Chemotherapy     Herceptin maintenance therapy , 6 mg/kg, every 3 weeks      05/03/2015 - 06/14/2015 Radiation Therapy    right breast adjuvant irradiation by Dr. MValere Dross       CURRENT THERAPY: Herceptin maintenance therapy every 3 weeks, started on 04/12/2015  INTERVAL HISTROY   NLevernereturns for follow-up and Herceptin treatment. She has developed dry cough again about 2 weeks ago, she did have sick contact from her kids and works with. She denies any fever, dyspnea, or chest pain. Her dry cough got a little worse last week around Christmas, and she start having productive cough with yellowish sputum, no other new symptoms[facility] felt to have acute bronchitis. She was prescribed with azithromycin, dexamethasone, and cough medicine. Her symptoms has been stable, she still has a mild to moderate productive cough, no fever or dyspnea. Mild sore throat, otherwise feels well. She also reports pulling sensation in the right axilla when she raises her arm since last week, no shortness swelling, arm weakness or edema.   MEDICAL  HISTORY:  Past Medical History:  Diagnosis Date  . Breast cancer ([place])   . Breast cancer ([place])   . GERD (gastroesophageal reflux disease)    during pregnancy   . Pneumonia    hx of pneumonia 08/2013   . S/P radiation therapy 05/03/2015 through 06/14/2015    Right breast 4680 cGy in 26 sessions, right breast boost 1000 cGy in 5 sessions. Right supraclavicular/axillary region 4680 cGy with a supplemental PA field to bring the axillary dose up to 4500 cGy in 26 sessions    SURGICAL HISTORY: Past Surgical History:  Procedure Laterality Date  . BREAST  LUMPECTOMY WITH RADIOACTIVE SEED AND SENTINEL LYMPH NODE BIOPSY Right 03/29/2015   Procedure: BREAST LUMPECTOMY WITH RADIOACTIVE SEED AND SENTINEL LYMPH NODE BIOPSY;  Surgeon: Stark Klein, MD;  Location: [facility];  Service: General;  Laterality: Right;  . CESAREAN SECTION    . CHOLECYSTECTOMY    . ESSURE TUBAL LIGATION    . PORTACATH PLACEMENT Left 07/21/2014   Procedure: INSERTION PORT-A-CATH;  Surgeon: Stark Klein, MD;  Location: [facility];  Service: General;  Laterality: Left;  . WISDOM TOOTH EXTRACTION      SOCIAL HISTORY: History   Social History  . Marital Status: Married    Spouse Name: N/A    Number of Children:  she has 2 daughters at the age of 72 and 29.   . Years of Education: N/A   Occupational History  .  works as Freight forwarder for a [employer].    Social History Main Topics  . Smoking status: Never Smoker   . Smokeless tobacco: Not on file  . Alcohol Use: Yes  . Drug Use: No  . Sexual Activity: No    FAMILY HISTORY: Family History  Problem Relation Age of Onset  . Breast cancer Mother 53  . Liver cancer Father 15  . Breast cancer Maternal Aunt 36  . Prostate cancer Maternal Grandfather   . Liver cancer Paternal Grandmother   . Prostate cancer Paternal Grandfather      GENETICS: 08/30/2014 BreastNext panel was negative. 17 genes including BRCA1, BRCA2, were negative for mutations.   ALLERGIES:  is allergic to adhesive [tape]; aspirin; and codeine.  MEDICATIONS:  Current Outpatient Prescriptions  Medication Sig Dispense Refill  . albuterol (PROVENTIL HFA;VENTOLIN HFA) 108 (90 Base) MCG/ACT inhaler Inhale 2 puffs into the lungs every 6 (six) hours as needed for wheezing or shortness of breath. 1 Inhaler 2  . azithromycin (ZITHROMAX Z-PAK) 250 MG tablet Take 1 tablet (250 mg total) by mouth daily. 2 pills (566m) day 1 then 1 pill (2538m days 2-5. 6 tablet 0  . Calcium-Phosphorus-Vitamin D (CALCIUM GUMMIES PO) Take by mouth daily.    .  Camphor-Eucalyptus-Menthol (VICKS VAPORUB EX) Apply 1 application topically at bedtime. Applies under nose, throat and on chest.    . cetirizine (ZYRTEC) 10 MG tablet Take 10 mg by mouth daily as needed for allergies (allergies).     . clindamycin (CLINDAGEL) 1 % gel Apply topically to affected areas BID. 60 g 3  . dexamethasone (DECADRON) 4 MG tablet Take 1 tablet (4 mg total) by mouth 2 (two) times daily. 8 tablet 0  . fexofenadine (ALLEGRA) 180 MG tablet Take 180 mg by mouth daily as needed.  4  . fluconazole (DIFLUCAN) 200 MG tablet Take 1 tablet (200 mg total) by mouth daily. 1 tablet when starting antibiotics and second dose when finished. 2 tablet 0  . fluticasone (FLONASE) 50 MCG/ACT  nasal spray   1  . guaifenesin (ROBITUSSIN) 100 MG/5ML syrup Take 5-10 mLs (100-200 mg total) by mouth every 4 (four) hours as needed for cough. 60 mL 0  . hydrocortisone 2.5 % cream APPLY TOPICALLY TO THE AFFECTED AREA TWICE DAILY 454 g 0  . lidocaine-prilocaine (EMLA) cream APPLY TOPICALLY TO THE AFFECTED AREA AS NEEDED 30 g 0  . loperamide (IMODIUM A-D) 2 MG tablet Take 1 tablet (2 mg total) by mouth 4 (four) times daily as needed for diarrhea or loose stools. 30 tablet 0  . LORazepam (ATIVAN) 0.5 MG tablet Take 1 tablet (0.5 mg total) by mouth every 8 (eight) hours. 3 tablet 0  . metaxalone (SKELAXIN) 800 MG tablet Take 1 tablet (800 mg total) by mouth 3 (three) times daily. 30 tablet 0  . Multiple Vitamins-Minerals (MULTIVITAMIN GUMMIES ADULT PO) Take 1 each by mouth daily. Women's Vitafusion gummie    . nystatin (NYSTATIN) powder Apply topically 2 (two) times daily. 15 g 2  . Pseudoeph-Doxylamine-DM-APAP (NYQUIL PO) Take 30 mLs by mouth 2 (two) times daily as needed (cold/allergies).     . triamcinolone (KENALOG) 0.025 % ointment Apply 1 application topically 2 (two) times daily. 30 g 1   No current facility-administered medications for this visit.    Facility-Administered Medications Ordered in Other  Visits  Medication Dose Route Frequency Provider Last Rate Last Dose  . 0.9 %  sodium chloride infusion   Intravenous Once [name], MD      . acetaminophen (TYLENOL) tablet 650 mg  650 mg Oral Once [name], MD      . acetaminophen (TYLENOL) tablet 650 mg  650 mg Oral Once [name], MD      . heparin lock flush 100 unit/mL  500 Units Intracatheter Once PRN [name], MD      . sodium chloride 0.9 % injection 10 mL  10 mL Intracatheter PRN [name], MD   10 mL at 05/01/16 1622  . sodium chloride 0.9 % injection 10 mL  10 mL Intracatheter PRN [name], MD      . trastuzumab (HERCEPTIN) 609 mg in sodium chloride 0.9 % 250 mL chemo infusion  6 mg/kg (Order-Specific) Intravenous Once [name], MD        REVIEW OF SYSTEMS:   Constitutional: Denies fevers, chills or abnormal night sweats Eyes: Denies blurriness of vision, double vision or watery eyes Ears, nose, mouth, throat, and face: Denies mucositis or sore throat Respiratory: Occasional dry cough, no dyspnea or wheezes Cardiovascular: Denies palpitation, chest discomfort or lower extremity swelling Gastrointestinal:  Denies nausea, heartburn or change in bowel habits Skin:(+)  skin pigmentation on bilateral forearms and left elbow, much improved Lymphatics: Denies new lymphadenopathy or easy bruising Neurological:Denies numbness, tingling or new weaknesses Behavioral/Psych: Mood is stable, no new changes  All other systems were reviewed with the patient and are negative.  PHYSICAL EXAMINATION: ECOG PERFORMANCE STATUS: 0 BP (!) 160/83 (BP Location: Left Arm, Patient Position: Sitting) Comment: made nurse aware of elevated BP  Pulse (!) 109 Comment: made nurse aware of elevated Pulse  Temp 98.3 F (36.8 C) (Oral)   Resp 18   Ht _0  (1.778 m)   Wt 239 lb 4.8 oz (108.5 kg)   LMP 07/13/2016 (Exact Date)   SpO2 98%   BMI 34.34 kg/m   GENERAL:alert, no distress and comfortable SKIN: skin color, texture, turgor are normal, no rashes or  significant lesions EYES: normal, conjunctiva are pink and non-injected,  sclera clear OROPHARYNX:no exudate, no erythema and lips, buccal mucosa, and tongue normal  NECK: supple, thyroid normal size, non-tender, without nodularity LYMPH:  no palpable lymphadenopathy in the cervical, axillary or inguinal LUNGS: clear to auscultation and percussion with normal breathing effort HEART: regular rate & rhythm and no murmurs and no lower extremity edema ABDOMEN:abdomen soft, non-tender and normal bowel sounds Musculoskeletal:no cyanosis of digits and no clubbing, no tenderness on bilateral chest wall or spine PSYCH: alert & oriented x 3 with fluent speech NEURO: no focal motor/sensory deficits Breasts:  Status post right breast lumpectomy and sentinel lymph node biopsy, incisions have healed well. The previous palpable seroma has resolved. (+) mild skin pigmentation of right breast secondary to radiation. Palpation of both breasts  and axillas revealed no other obvious mass that I could appreciate. (+) Mild tenderness in the anterior right axilla, no palpable abnormality  Skin: No skin rashes.   LABORATORY DATA:  I have reviewed the data as listed CBC Latest Ref Rng & Units 08/14/2016 07/03/2016 05/22/2016  WBC 3.9 - 10.3 10e3/uL 10.8(H) 7.3 9.3  Hemoglobin 11.6 - 15.9 g/dL 13.0 11.7 12.9  Hematocrit 34.8 - 46.6 % 38.4 34.9 40.0  Platelets 145 - 400 10e3/uL 281 216 237    CMP Latest Ref Rng & Units 08/14/2016 07/03/2016 05/22/2016  Glucose 70 - 140 mg/dl 137 94 128  BUN 7.0 - 26.0 mg/dL 22.4 9.6 20.2  Creatinine 0.6 - 1.1 mg/dL 1.0 0.8 1.0  Sodium 136 - 145 mEq/L 140 139 142  Potassium 3.5 - 5.1 mEq/L 3.7 3.6 3.9  Chloride 101 - 111 mmol/L - - -  CO2 22 - 29 mEq/L _0 Calcium 8.4 - 10.4 mg/dL 9.8 9.3 9.5  Total Protein 6.4 - 8.3 g/dL 7.7 7.4 7.8  Total Bilirubin 0.20 - 1.20 mg/dL 0.26 0.56 0.30  Alkaline Phos 40 - 150 U/L 83 75 89  AST 5 - 34 U/L _1 ALT 0 - 55 U/L 20 25 32     PATHOLOGY REPORT 07/09/2014 #1 breast, right needle core biopsy more anterior -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 45)  Lung, biopsy, Left 07/26/2014 - HIGH GRADE CARCINOMA, SEE COMMENT. Microscopic Comment The carcinoma demonstrates the following immunophenotype: TTF-1 - negative expression. Napsin A- negative expression. CK5/6 - focal, moderate expression. estrogen receptor - negative expression. GCDFP- negative expression. The recent breast biopsy demonstrating Her2 amplified high grade invasive mammary carcinoma is noted ([id number]). Although the immunophenotype of the current case is non-sepcific, it strongly argues against primary lung adenocarcinoma. However, on re-review, the morphology of the current case is essentially identical to the primary mammary carcinoma. In lieu of further immunophenotyping, the tumor will be submitted for Her2 testing and the remaining tissue will be reserved for additional ancillary tumor testing. The results of the Her2 testing will be reported in an addendum. The case was discussed with Dr Burr Medico on 07/28/2015 and 07/29/2015 HER2 POSITIVE   Diagnosis 03/29/2015 1. Breast, lumpectomy, Right - INVASIVE DUCTAL CARCINOMA, SEE COMMENT. - SEE TUMOR SYNOPTIC TEMPLATE BELOW. 2. Lymph node, sentinel, biopsy, Right axillary #1 - ONE LYMPH NODE, NEGATIVE FOR TUMOR (0/1). - SEE COMMENT. 3. Lymph node, sentinel, biopsy, Right axillary #2 - BENIGN FIBROFATTY SOFT TISSUE. - NEGATIVE FOR ATYPIA OR MALIGNANCY. - NEGATIVE FOR LYMPH NODE.  4. Lymph node, sentinel, biopsy, Right axillary #3 - ONE LYMPH NODE, NEGATIVE FOR TUMOR (0/1). - SEE COMMENT. 5. Lymph node, sentinel, biopsy, Right axillary #4 - ONE LYMPH NODE, NEGATIVE FOR  TUMOR (0/1). - SEE COMMENT. 6. Lymph node, sentinel, biopsy, Right axillary #5 - ONE LYMPH NODE, NEGATIVE FOR TUMOR (0/1). - SEE COMMENT. 7. Lymph node, sentinel, biopsy, right axillary - ONE LYMPH NODE, NEGATIVE FOR TUMOR (0/1). - SEE COMMENT. 8. Lymph node, sentinel, biopsy, right axillary - ONE LYMPH NODE, NEGATIVE FOR TUMOR (0/1). - SEE COMMENT. 1 of 4 Amended copy Amended FINAL for ASLYNN, BRUNETTI ([id number]) Microscopic Comment 1. BREAST, INVASIVE TUMOR, WITH LYMPH NODES PRESENT Specimen, including laterality and lymph node sampling (sentinel, non-sentinel): Right breast with sentinel and non-sentinel lymph node sampling Procedure: Lumpectomy Histologic type: Ductal, see comment Grade: 2 of 3, see comment. Tubule formation: 3 Nuclear pleomorphism: 3 Mitotic: 1 Tumor size (glass slide measurement): 1 mm, see comment Margins: Invasive, distance to closest margin: See comment In-situ, distance to closest margin: N/A If margin positive, focally or broadly: N/A Lymphovascular invasion: Absent Ductal carcinoma in situ: Absent Grade: N/A Extensive intraductal component: N/A Lobular neoplasia: Absent Tumor focality: Unifocal, see comment Treatment effect: Present If present, treatment effect in breast tissue, lymph nodes or both: Both breast tissue and lymph nodes Extent of tumor: Skin: N/A Nipple: N/A Skeletal muscle: N/A Lymph nodes: Examined: 5 Sentinel 2 Non-sentinel 7 Total Lymph nodes with metastasis: 0 Isolated tumor cells (< 0.2 mm): N/A Micrometastasis: (> 0.2 mm and < 2.0 mm): N/A Macrometastasis: (> 2.0 mm): N/A Extracapsular extension: N/A Breast prognostic profile: Estrogen receptor: Not repeated, previous study demonstrated 0% positivity ([id number]). Progesterone receptor: Not repeated, previous study demonstrated 0% positivity ([id number]) Her 2 neu: Demonstrates amplificaition ([id number]) Ki-67: Not repeated, previous study demonstrate 79%  proliferation rate ([id number]). Non-neoplastic breast: Neoadjuvant related tissue changes and adenosis. TNM: ypT90m, ypN0, pMX Comments: Numerous representative sections including the 2.0 cm hemorrhagic tissue associated with X-shaped biopsy clip, the 2.0 cm ill defined lesion associated with M-shaped clip, and intervening tissue were submitted for review. Slide sections from the tissue surrounding the X-shaped and M-shaped clips demonstrate tissue changes consistent with neoadjuvant related change. There is no invasive or in situ carcinom present at either site. However, in a representative section from the intervening tissue (slide G), there is a 1 mm focus of high grade invasive ductal carcinoma present. Given the minute size of the tumor, tumor grading is limited. The tumor is present within non-marginal tissue sections and is considered to be at least 0.5 cm from the nearest margin (medial).   Note: Amendment issued for modification of synoptic table, inadvertent typographical error. The change in the synoptic table was discussed with Dr FBurr Medicoon 04/07/2015. (CRR:ecj 04/07/2015) 2. , 4-8. In parts 2 and 4, there is extensive neoadjuvant related tissue changes including abundant foamy macrophages, fibroinflammatory reaction and dystrophic calcifications. The neoadjuvant tissue changes extend into perinodal soft tissue and are either focally or broadly present at the cauterized edge of the tissue submitted. There are no definitive features of malignancy present.  RADIOGRAPHIC STUDIES:  CT chest, abdomen and pelvis with contrast 05/17/2016 IMPRESSION: 1. Stable appearance of the chest, abdomen, and pelvis, with bandlike density with very minimal nodularity in the left upper lobe at the site of previously treated metastatic disease unchanged from prior. No findings of new metastatic disease. 2. Mild airway thickening and minimal airway plugging in the right lower lobe. 3. Other imaging  findings  of potential clinical significance: Mild thoracolumbar scoliosis. Diffuse hepatic steatosis. 4 mm right kidney lower pole nonobstructive calculus. Prominent stool throughout the colon favors constipation.   Bone scan 02/23/2016 IMPRESSION: No definite evidence of osseous metastatic disease.  Subtle increased uptake in the posterior lateral aspect of the right eighth rib merits further evaluation with a right rib x-ray series.  CT head w wo contrast 07/03/2016 IMPRESSION: No CT findings of intracranial metastasis though, MRI of the brain with contrast would be more sensitive for subtle abnormalities.  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  ECHO 05/22/2016 Study Conclusions  - Left ventricle: The cavity size was normal. Wall thickness was   normal. Systolic function was normal. The estimated ejection   fraction was in the range of 60% to 65%. Wall motion was normal;   there were no regional wall motion abnormalities. Features are   consistent with a pseudonormal left ventricular filling pattern,   with concomitant abnormal relaxation and increased filling   pressure (grade 2 diastolic dysfunction). - Mitral valve: There was mild regurgitation.   ASSESSMENT & PLAN:  46 y.o.  African-American female, premenopausal, without significant past medical history, presented with palpable right breast mass and right axillary mass.   1. 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 - I reviewed her breasts surgical pathology results with her in great detail ,  She has had fantastic partial response  (near complete response) form 8 months of chemotherapy and antibody therapy.   The initial 6.3 cm breast mass has only 1 mm residual tumor,  And the previously 4.4 cm right axillary lymph node has had complete  Pathological response, all 7 nodes were negative for cancer. -I reviewed her restaging CT and bone scans from 05/17/2016, which showed no evidence of disease -She is clinically doing very well, labs are normal, no clinical concern for disease progression or recurrence. -He was not able to tolerate MRI, which was changed to CT head with and without contrast. CT scan showed no evidence of metastasis, I reviewed the results with her. -Continue monitoring cardiac echo every 4-6 months, she will follow up with Dr. BHaroldine Laws -She is clinically doing well overall, but has developed moderate cough with mild sputum production, and did not respond to also of antibiotics and steroids. Due to her lung metastasis, I will get her restaging scan in 2 weeks to rule out disease progression  -lab reviewed, adequate for treatment, we'll continue Herceptin every 3 weeks  2.  productive cough  -It started about 2 weeks ago, was seen in the ED, did not improve after course of antibiotics and steroids -Repeat a CT scan in 2 weeks -Possible virus bronchitis, I do not feel she needs a second course of antibiotics.  3. Mild peripheral neuropathy, G1 -Probably related to her prior chemotherapy -Very mild, we'll continue monitoring.   4. Right axillary pain -She has mild pain and pulling sensation at the right axilla when she raises her right arm, tender on exam -Likely related to her previous surgery. I encouraged her to restart exercise of her shoulder and arm, and use heating pad as needed    Plan for today  -CT CAP with contrast  in 2 weeks -Lab, flushed, follow-up and Herceptin in 3 weeks   I spent about 20 minutes counseling the patient and her family members, total care was about 25 minutes.  [name]   08/14/2016

## 2016-08-14 NOTE — Patient Instructions (Signed)
[facility] Discharge Instructions for Patients Receiving Chemotherapy  Today you received the following chemotherapy agents: Herceptin   To help prevent nausea and vomiting after your treatment, we encourage you to take your nausea medication as directed.    If you develop nausea and vomiting that is not controlled by your nausea medication, call the clinic.   BELOW ARE SYMPTOMS THAT SHOULD BE REPORTED IMMEDIATELY:  *FEVER GREATER THAN 100.5 F  *CHILLS WITH OR WITHOUT FEVER  NAUSEA AND VOMITING THAT IS NOT CONTROLLED WITH YOUR NAUSEA MEDICATION  *UNUSUAL SHORTNESS OF BREATH  *UNUSUAL BRUISING OR BLEEDING  TENDERNESS IN MOUTH AND THROAT WITH OR WITHOUT PRESENCE OF ULCERS  *URINARY PROBLEMS  *BOWEL PROBLEMS  UNUSUAL RASH Items with * indicate a potential emergency and should be followed up as soon as possible.  Feel free to call the clinic you have any questions or concerns. The clinic phone number is [phone].  Please show the CHEMO ALERT CARD at check-in to the Emergency Department and triage nurse.   

## 2016-08-15 ENCOUNTER — Telehealth: Payer: Self-pay | Admitting: Hematology

## 2016-08-15 NOTE — Telephone Encounter (Signed)
Spoke with patient re 1/23 appointments.

## 2016-08-28 ENCOUNTER — Ambulatory Visit (HOSPITAL_COMMUNITY)
Admission: RE | Admit: 2016-08-28 | Discharge: 2016-08-28 | Disposition: A | Discharge status: Home / Self Care | Payer: 59 | Source: Ambulatory Visit | Attending: Hematology | Admitting: Hematology

## 2016-08-28 DIAGNOSIS — C50911 Malignant neoplasm of unspecified site of right female breast: Secondary | ICD-10-CM

## 2016-08-28 DIAGNOSIS — N200 Calculus of kidney: Secondary | ICD-10-CM | POA: Insufficient documentation

## 2016-08-28 DIAGNOSIS — C7800 Secondary malignant neoplasm of unspecified lung: Secondary | ICD-10-CM | POA: Insufficient documentation

## 2016-08-28 MED ORDER — IOPAMIDOL (ISOVUE-300) INJECTION 61%: 100.0000 mL | Freq: Once | INTRAVENOUS | Status: DC | PRN

## 2016-08-28 MED ORDER — IOPAMIDOL (ISOVUE-300) INJECTION 61%: 30.0000 mL | Freq: Once | INTRAVENOUS | Status: DC | PRN

## 2016-08-28 MED ORDER — IOPAMIDOL (ISOVUE-300) INJECTION 61%
INTRAVENOUS | Status: AC
  Filled 2016-08-28: qty 100

## 2016-08-28 MED ORDER — IOPAMIDOL (ISOVUE-300) INJECTION 61%
INTRAVENOUS | Status: AC
  Filled 2016-08-28: qty 30

## 2016-08-28 MED ORDER — IOPAMIDOL (ISOVUE-300) INJECTION 61%
100.0000 mL | Freq: Once | INTRAVENOUS | Status: AC | PRN
  Administered 2016-08-28: 100 mL via INTRAVENOUS

## 2016-08-30 ENCOUNTER — Telehealth: Payer: Self-pay | Admitting: *Deleted

## 2016-08-30 NOTE — Telephone Encounter (Signed)
Called pt with report of CT per Dr Burr Medico & informed that it was good.  She was very appreciative & had some questions about renal calculus & tortuous aorta.  Informed not to be concerned about these.  She also is wondering if she might be approaching menapause due to changes in period.  She reports having a period 3 wks  2 days after previous period  In Dec & then this Sat had brown spotting.  Informed that this could be a possibility.  Reported to Dr Burr Medico & she agrees but will discuss at next visit.

## 2016-08-30 NOTE — Telephone Encounter (Signed)
-----   Message from [name], MD sent at 08/29/2016  5:30 PM EST ----- Please let pt know that her CT scan looks good, NED, thanks.  [name]  08/29/2016

## 2016-09-03 NOTE — Progress Notes (Signed)
[facility] Follow up note   Patient Care Team: Pcp Not In System as PCP - General [name], DO (Family Medicine) Holley Bouche, NP as Nurse Practitioner (Nurse Practitioner) Stark Klein, MD as Consulting Physician (General Surgery) Arloa Koh, MD as Consulting Physician (Radiation Oncology) [name], MD as Consulting Physician (Hematology)  CHIEF COMPLAINTS Follow up metastatic breast cancer  Oncology History   Breast cancer 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   Staging form: Breast, AJCC 7th Edition     Clinical stage from 07/22/2014: Stage IV (T3, N1, M1) - Unsigned       Breast cancer 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 ([place])   07/02/2014 Mammogram    Mammogram showed a 2cm right beast mass and a 1.8cm right axillary node. MRI breast on 07/16/2014 showed 7cm R breast lesion and 4.4cm r axillary node       07/02/2014 Imaging    CT CAP: a 4.7cm mass in LUL lung and a 2.1cm mas in RML, and a small nodule in RUL, suspecious for metastasis        07/09/2014 Initial Diagnosis    right IDA with b/l lung lesions, ER-/PR-/HER2+      07/09/2014 Initial Biopsy    US guided right breast mass and axillary node biopsy showed IDA, and DCIS, ER-/PR-/HER2+      07/26/2014 Pathologic Stage    Left lung mass by IR, path revealed high grade carcinoma, morphology similar to breast tumor biopsy, TTF(-), NapsinA(-), ER(-)      08/04/2014 - 03/22/2015 Chemotherapy    weekly Paclitaxel 43m/m2, trastuzumab and pertuzumab every 3 weeks      10/04/2014 Imaging    Interval decrease in the right axillary lymphadenopathy. Bilateral pulmonary lesions with left hilar lymphadenopathy also markedly decreased in the interval. The left hilar lymphadenopathy has resolved.      12/20/2014 Imaging    restaging CT showed stable disease, no new lesions       03/29/2015 Pathology Results     right breast lumpectomy showed  chemotherapy treatment effect,  a 1 mm residual tumor,    margins were widely negative, 5 sentinel lymph nodes and 2 axillary lymph nodes were negative.       03/29/2015 Surgery     right breast lumpectomy and sentinel lymph node biopsy  by Dr. BBarry Dienes     04/12/2015 -  Chemotherapy     Herceptin maintenance therapy , 6 mg/kg, every 3 weeks      05/03/2015 - 06/14/2015 Radiation Therapy    right breast adjuvant irradiation by Dr. MValere Dross      08/28/2016 Imaging    CT CAP w Contrast  IMPRESSION: 1. No acute process or evidence of metastatic disease within the chest, abdomen, or pelvis. 2. Similar to less well-defined left upper lobe density, likely scarring. No evidence of new or progressive pulmonary metastasis. 3. Right nephrolithiasis.       CURRENT THERAPY: Herceptin maintenance therapy every 3 weeks, started on 04/12/2015  INTERVAL HISTROY   NCerrareturns for follow-up and Herceptin treatment. Her sinus infection is much better, but it won't fully go away. She still coughs some, but not very often. She has right sided pain that radiates to her lower back. She read that she has a kidney stone. The pain is constant, but isn't always very severe. This started about a year ago. She occasionally gets spasms in that location. She typically drinks a lot of water. She uses a heating pad for relief.  Denies any other concerns.   MEDICAL HISTORY:  Past Medical History:  Diagnosis Date  . Breast cancer ([place])   . Breast cancer ([place])   . GERD (gastroesophageal reflux disease)    during pregnancy   . Pneumonia    hx of pneumonia 08/2013   . S/P radiation therapy 05/03/2015 through 06/14/2015    Right breast 4680 cGy in 26 sessions, right breast boost 1000 cGy in 5 sessions. Right supraclavicular/axillary region 4680 cGy with a supplemental PA field to bring the axillary dose up to 4500 cGy in 26 sessions    SURGICAL HISTORY: Past Surgical History:  Procedure Laterality Date  . BREAST  LUMPECTOMY WITH RADIOACTIVE SEED AND SENTINEL LYMPH NODE BIOPSY Right 03/29/2015   Procedure: BREAST LUMPECTOMY WITH RADIOACTIVE SEED AND SENTINEL LYMPH NODE BIOPSY;  Surgeon: Stark Klein, MD;  Location: [facility];  Service: General;  Laterality: Right;  . CESAREAN SECTION    . CHOLECYSTECTOMY    . ESSURE TUBAL LIGATION    . PORTACATH PLACEMENT Left 07/21/2014   Procedure: INSERTION PORT-A-CATH;  Surgeon: Stark Klein, MD;  Location: [facility];  Service: General;  Laterality: Left;  . WISDOM TOOTH EXTRACTION      SOCIAL HISTORY: History   Social History  . Marital Status: Married    Spouse Name: N/A    Number of Children:  she has 2 daughters at the age of 75 and 44.   . Years of Education: N/A   Occupational History  .  works as Freight forwarder for a [employer].    Social History Main Topics  . Smoking status: Never Smoker   . Smokeless tobacco: Not on file  . Alcohol Use: Yes  . Drug Use: No  . Sexual Activity: No    FAMILY HISTORY: Family History  Problem Relation Age of Onset  . Breast cancer Mother 88  . Liver cancer Father 78  . Breast cancer Maternal Aunt 36  . Prostate cancer Maternal Grandfather   . Liver cancer Paternal Grandmother   . Prostate cancer Paternal Grandfather      GENETICS: 08/30/2014 BreastNext panel was negative. 17 genes including BRCA1, BRCA2, were negative for mutations.   ALLERGIES:  is allergic to adhesive [tape]; aspirin; and codeine.  MEDICATIONS:  Current Outpatient Prescriptions  Medication Sig Dispense Refill  . Calcium-Phosphorus-Vitamin D (CALCIUM GUMMIES PO) Take by mouth daily.    . Camphor-Eucalyptus-Menthol (VICKS VAPORUB EX) Apply 1 application topically at bedtime. Applies under nose, throat and on chest.    . cetirizine (ZYRTEC) 10 MG tablet Take 10 mg by mouth daily as needed for allergies (allergies).     . fexofenadine (ALLEGRA) 180 MG tablet Take 180 mg by mouth daily as needed.  4  . fluticasone (FLONASE)  50 MCG/ACT nasal spray   1  . lidocaine-prilocaine (EMLA) cream APPLY TOPICALLY TO THE AFFECTED AREA AS NEEDED 30 g 0  . loperamide (IMODIUM A-D) 2 MG tablet Take 1 tablet (2 mg total) by mouth 4 (four) times daily as needed for diarrhea or loose stools. 30 tablet 0  . LORazepam (ATIVAN) 0.5 MG tablet Take 1 tablet (0.5 mg total) by mouth every 8 (eight) hours. 3 tablet 0  . Multiple Vitamins-Minerals (AIRBORNE PO) Take 1 tablet by mouth daily.    . Multiple Vitamins-Minerals (MULTIVITAMIN GUMMIES ADULT PO) Take 1 each by mouth daily. Women's Vitafusion gummie    . nystatin (NYSTATIN) powder Apply topically 2 (two) times daily. 15 g 2  . triamcinolone (  KENALOG) 0.025 % ointment Apply 1 application topically 2 (two) times daily. 30 g 1  . albuterol (PROVENTIL HFA;VENTOLIN HFA) 108 (90 Base) MCG/ACT inhaler Inhale 2 puffs into the lungs every 6 (six) hours as needed for wheezing or shortness of breath. (Patient not taking: Reported on 09/04/2016) 1 Inhaler 2  . hydrocortisone 2.5 % cream APPLY TOPICALLY TO THE AFFECTED AREA TWICE DAILY (Patient not taking: Reported on 09/04/2016) 454 g 0  . metaxalone (SKELAXIN) 800 MG tablet Take 1 tablet (800 mg total) by mouth 3 (three) times daily. (Patient not taking: Reported on 09/04/2016) 30 tablet 0   No current facility-administered medications for this visit.    Facility-Administered Medications Ordered in Other Visits  Medication Dose Route Frequency Provider Last Rate Last Dose  . acetaminophen (TYLENOL) tablet 650 mg  650 mg Oral Once [name], MD      . acetaminophen (TYLENOL) tablet 650 mg  650 mg Oral Once [name], MD      . heparin lock flush 100 unit/mL  500 Units Intracatheter Once PRN [name], MD      . sodium chloride 0.9 % injection 10 mL  10 mL Intracatheter PRN [name], MD   10 mL at 05/01/16 1622  . sodium chloride 0.9 % injection 10 mL  10 mL Intracatheter PRN [name], MD        REVIEW OF SYSTEMS:   Constitutional: Denies fevers,  chills or abnormal night sweats (+) right flank pain Eyes: Denies blurriness of vision, double vision or watery eyes Ears, nose, mouth, throat, and face: Denies mucositis or sore throat Respiratory: Occasional dry cough, no dyspnea or wheezes Cardiovascular: Denies palpitation, chest discomfort or lower extremity swelling Gastrointestinal:  Denies nausea, heartburn or change in bowel habits Skin:(+)  skin pigmentation on bilateral forearms and left elbow, much improved Lymphatics: Denies new lymphadenopathy or easy bruising Neurological:Denies numbness, tingling or new weaknesses Behavioral/Psych: Mood is stable, no new changes  All other systems were reviewed with the patient and are negative.   PHYSICAL EXAMINATION: ECOG PERFORMANCE STATUS: 0 BP (!) 151/84 (BP Location: Left Arm, Patient Position: Sitting) Comment: Made nurse aware of elevated BP  Pulse 78   Temp 97.9 F (36.6 C) (Oral)   Resp 18   Ht 5' 10"  (1.778 m)   Wt 237 lb 8 oz (107.7 kg)   LMP 07/13/2016 (Exact Date)   SpO2 100%   BMI 34.08 kg/m   GENERAL:alert, no distress and comfortable SKIN: skin color, texture, turgor are normal, no rashes or significant lesions EYES: normal, conjunctiva are pink and non-injected, sclera clear OROPHARYNX:no exudate, no erythema and lips, buccal mucosa, and tongue normal  NECK: supple, thyroid normal size, non-tender, without nodularity LYMPH:  no palpable lymphadenopathy in the cervical, axillary or inguinal LUNGS: clear to auscultation and percussion with normal breathing effort HEART: regular rate & rhythm and no murmurs and no lower extremity edema ABDOMEN:abdomen soft, non-tender and normal bowel sounds Musculoskeletal:no cyanosis of digits and no clubbing, (+) mild tenderness at right posterior low rib cage  PSYCH: alert & oriented x 3 with fluent speech NEURO: no focal motor/sensory deficits Breasts:  Status post right breast lumpectomy and sentinel lymph node biopsy,  incisions have healed well. The previous palpable seroma has resolved. (+) mild skin pigmentation of right breast secondary to radiation. Palpation of both breasts  and axillas revealed no other obvious mass that I could appreciate. (+) Mild tenderness in the anterior right axilla, no palpable abnormality  Skin: No skin rashes.   LABORATORY DATA:  I have reviewed the data as listed CBC Latest Ref Rng & Units 09/04/2016 08/14/2016 07/03/2016  WBC 3.9 - 10.3 10e3/uL 5.7 10.8(H) 7.3  Hemoglobin 11.6 - 15.9 g/dL 12.5 13.0 11.7  Hematocrit 34.8 - 46.6 % 37.2 38.4 34.9  Platelets 145 - 400 10e3/uL 220 281 216    CMP Latest Ref Rng & Units 09/04/2016 08/14/2016 07/03/2016  Glucose 70 - 140 mg/dl 95 137 94  BUN 7.0 - 26.0 mg/dL 13.0 22.4 9.6  Creatinine 0.6 - 1.1 mg/dL 0.9 1.0 0.8  Sodium 136 - 145 mEq/L 140 140 139  Potassium 3.5 - 5.1 mEq/L 4.1 3.7 3.6  Chloride 101 - 111 mmol/L - - -  CO2 22 - 29 mEq/L 25 27 26   Calcium 8.4 - 10.4 mg/dL 9.8 9.8 9.3  Total Protein 6.4 - 8.3 g/dL 7.5 7.7 7.4  Total Bilirubin 0.20 - 1.20 mg/dL 0.38 0.26 0.56  Alkaline Phos 40 - 150 U/L 73 83 75  AST 5 - 34 U/L 19 14 21   ALT 0 - 55 U/L 26 20 25     PATHOLOGY REPORT 07/09/2014 #1 breast, right needle core biopsy more anterior -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 45)  Lung, biopsy, Left 07/26/2014 - HIGH GRADE CARCINOMA, SEE COMMENT. Microscopic Comment The carcinoma demonstrates the following immunophenotype: TTF-1 - negative expression. Napsin A- negative expression. CK5/6 - focal, moderate expression. estrogen receptor - negative expression. GCDFP- negative expression. The recent breast biopsy demonstrating Her2 amplified high grade invasive mammary carcinoma is  noted ([id number]). Although the immunophenotype of the current case is non-sepcific, it strongly argues against primary lung adenocarcinoma. However, on re-review, the morphology of the current case is essentially identical to the primary mammary carcinoma. In lieu of further immunophenotyping, the tumor will be submitted for Her2 testing and the remaining tissue will be reserved for additional ancillary tumor testing. The results of the Her2 testing will be reported in an addendum. The case was discussed with Dr Burr Medico on 07/28/2015 and 07/29/2015 HER2 POSITIVE   Diagnosis 03/29/2015 1. Breast, lumpectomy, Right - INVASIVE DUCTAL CARCINOMA, SEE COMMENT. - SEE TUMOR SYNOPTIC TEMPLATE BELOW. 2. Lymph node, sentinel, biopsy, Right axillary #1 - ONE LYMPH NODE, NEGATIVE FOR TUMOR (0/1). - SEE COMMENT. 3. Lymph node, sentinel, biopsy, Right axillary #2 - BENIGN FIBROFATTY SOFT TISSUE. - NEGATIVE FOR ATYPIA OR MALIGNANCY. - NEGATIVE FOR LYMPH NODE. 4. Lymph node, sentinel, biopsy, Right axillary #3 - ONE LYMPH NODE, NEGATIVE FOR TUMOR (0/1). - SEE COMMENT. 5. Lymph node, sentinel, biopsy, Right axillary #4 - ONE LYMPH NODE, NEGATIVE FOR TUMOR (0/1). - SEE COMMENT. 6. Lymph node, sentinel, biopsy, Right axillary #5 - ONE LYMPH NODE, NEGATIVE FOR TUMOR (0/1). - SEE COMMENT. 7. Lymph node, sentinel, biopsy, right axillary - ONE LYMPH NODE, NEGATIVE FOR TUMOR (0/1). - SEE COMMENT. 8. Lymph node, sentinel, biopsy, right axillary - ONE LYMPH NODE, NEGATIVE FOR TUMOR (0/1). - SEE COMMENT. 1 of 4 Amended copy Amended FINAL for GLENDI, MOHIUDDIN ([id number]) Microscopic Comment 1. BREAST, INVASIVE TUMOR, WITH LYMPH NODES PRESENT Specimen, including laterality and lymph node sampling (sentinel, non-sentinel): Right breast with sentinel and non-sentinel lymph node sampling Procedure: Lumpectomy Histologic type: Ductal, see comment Grade: 2 of 3, see comment. Tubule formation: 3 Nuclear  pleomorphism:  3 Mitotic: 1 Tumor size (glass slide measurement): 1 mm, see comment Margins: Invasive, distance to closest margin: See comment In-situ, distance to closest margin: N/A If margin positive, focally or broadly: N/A Lymphovascular invasion: Absent Ductal carcinoma in situ: Absent Grade: N/A Extensive intraductal component: N/A Lobular neoplasia: Absent Tumor focality: Unifocal, see comment Treatment effect: Present If present, treatment effect in breast tissue, lymph nodes or both: Both breast tissue and lymph nodes Extent of tumor: Skin: N/A Nipple: N/A Skeletal muscle: N/A Lymph nodes: Examined: 5 Sentinel 2 Non-sentinel 7 Total Lymph nodes with metastasis: 0 Isolated tumor cells (< 0.2 mm): N/A Micrometastasis: (> 0.2 mm and < 2.0 mm): N/A Macrometastasis: (> 2.0 mm): N/A Extracapsular extension: N/A Breast prognostic profile: Estrogen receptor: Not repeated, previous study demonstrated 0% positivity ([id number]). Progesterone receptor: Not repeated, previous study demonstrated 0% positivity ([id number]) Her 2 neu: Demonstrates amplificaition ([id number]) Ki-67: Not repeated, previous study demonstrate 79% proliferation rate ([id number]). Non-neoplastic breast: Neoadjuvant related tissue changes and adenosis. TNM: ypT68m, ypN0, pMX Comments: Numerous representative sections including the 2.0 cm hemorrhagic tissue associated with X-shaped biopsy clip, the 2.0 cm ill defined lesion associated with M-shaped clip, and intervening tissue were submitted for review. Slide sections from the tissue surrounding the X-shaped and M-shaped clips demonstrate tissue changes consistent with neoadjuvant related change. There is no invasive or in situ carcinom present at either site. However, in a representative section from the intervening tissue (slide G), there is a 1 mm focus of high grade invasive ductal carcinoma present. Given the minute size of the tumor, tumor grading  is limited. The tumor is present within non-marginal tissue sections and is considered to be at least 0.5 cm from the nearest margin (medial).   Note: Amendment issued for modification of synoptic table, inadvertent typographical error. The change in the synoptic table was discussed with Dr FBurr Medicoon 04/07/2015. (CRR:ecj 04/07/2015) 2. , 4-8. In parts 2 and 4, there is extensive neoadjuvant related tissue changes including abundant foamy macrophages, fibroinflammatory reaction and dystrophic calcifications. The neoadjuvant tissue changes extend into perinodal soft tissue and are either focally or broadly present at the cauterized edge of the tissue submitted. There are no definitive features of malignancy present.  RADIOGRAPHIC STUDIES:  CT chest, abdomen and pelvis with contrast 05/17/2016 IMPRESSION: 1. Stable appearance of the chest, abdomen, and pelvis, with bandlike density with very minimal nodularity in the left upper lobe at the site of previously treated metastatic disease unchanged from prior. No findings of new metastatic disease. 2. Mild airway thickening and minimal airway plugging in the right lower lobe. 3. Other imaging findings of potential clinical significance: Mild thoracolumbar scoliosis. Diffuse hepatic steatosis. 4 mm right kidney lower pole nonobstructive calculus. Prominent stool throughout the colon favors constipation.   Bone scan 02/23/2016 IMPRESSION: No definite evidence of osseous metastatic disease.  Subtle increased uptake in the posterior lateral aspect of the right eighth rib merits further evaluation with a right rib x-ray series.  CT head w wo contrast 07/03/2016 IMPRESSION: No CT findings of intracranial metastasis though, MRI of the brain with contrast would be more sensitive for subtle abnormalities.  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  CT CAP w contrast 08/28/2016 IMPRESSION: 1. No acute process or evidence of metastatic disease within the chest, abdomen, or pelvis. 2. Similar to less well-defined left upper lobe density, likely scarring. No evidence  of new or progressive pulmonary metastasis. 3. Right nephrolithiasis.  ECHO 05/22/2016 Study Conclusions  - Left ventricle: The cavity size was normal. Wall thickness was   normal. Systolic function was normal. The estimated ejection   fraction was in the range of 60% to 65%. Wall motion was normal;   there were no regional wall motion abnormalities. Features are   consistent with a pseudonormal left ventricular filling pattern,   with concomitant abnormal relaxation and increased filling   pressure (grade 2 diastolic dysfunction). - Mitral valve: There was mild regurgitation.   ASSESSMENT & PLAN:  46 y.o.  African-American female, premenopausal, without significant past medical history, presented with palpable right breast mass and right axillary mass.   1. 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 - I previously reviewed her breasts surgical pathology results with her in great detail ,  She has had fantastic partial response (near complete response) form 8 months of chemotherapy and antibody therapy.   The initial 6.3 cm breast mass has only 1 mm residual tumor,  And the previously 4.4 cm right axillary lymph node has had complete  Pathological response, all 7 nodes were negative for cancer. -I reviewed her restaging CT scan from generous 16 2018, which showed no evidence of disease, the scar tissue in left upper lobe lung is unchanged. No suspicious bone lesions on the  CT scan. -Continue monitoring cardiac echo every 4-6 months, she will follow up with Dr. BHaroldine Laws -lab reviewed, adequate for treatment, we'll continue Herceptin every 3 weeks  2. Chronic sinusitis  -She has had recurrent sinusitis, and bronchitis. Her symptom has improved lately. -Continue symptom management.  3. Mild peripheral neuropathy, G1 -Probably related to her prior chemotherapy -Very mild, we'll continue monitoring.   4. Right sided chest pain -She has been having intermittent right-sided posterior low chest pain, radiates to her lower back. -She has some abnormal uptake on her bone scan in July 2017, possible old fracture. -Her pain could be muscular pain related to her chronic cough  -She has a kidney stone, which has been present since 2015, but I doubt her chest pains related to her case to him. -I suggested a Lidocaine patch, but she declined.  -Continue using heating pad.    Plan for today  -Reviewed CT.  -Next Echo in March/April. -Herceptin in 3 weeks. Follow up, Herceptin, flush and labs in 6 weeks.   I spent about 20 minutes counseling the patient and her family members, total care was about 25 minutes.  This document serves as a record of services personally performed by YTruitt Merle MD. It was created on her behalf by [email or web address], a trained medical scribe. The creation of this record is based on the scribe's personal observations and the provider's statements to them. This document has been checked and approved by the attending provider.  I have reviewed the above documentation for accuracy and completeness and I agree with the above.  FTruitt Merle 09/04/2016

## 2016-09-04 ENCOUNTER — Ambulatory Visit: Payer: 59 | Admitting: Hematology

## 2016-09-04 ENCOUNTER — Ambulatory Visit (HOSPITAL_BASED_OUTPATIENT_CLINIC_OR_DEPARTMENT_OTHER): Payer: 59 | Admitting: Hematology

## 2016-09-04 ENCOUNTER — Ambulatory Visit: Payer: 59

## 2016-09-04 ENCOUNTER — Other Ambulatory Visit: Payer: 59

## 2016-09-04 ENCOUNTER — Ambulatory Visit (HOSPITAL_BASED_OUTPATIENT_CLINIC_OR_DEPARTMENT_OTHER): Payer: 59

## 2016-09-04 ENCOUNTER — Encounter: Payer: Self-pay | Admitting: Hematology

## 2016-09-04 ENCOUNTER — Other Ambulatory Visit (HOSPITAL_BASED_OUTPATIENT_CLINIC_OR_DEPARTMENT_OTHER): Payer: 59

## 2016-09-04 VITALS — BP 151/84 | HR 78 | Temp 97.9°F | Resp 18 | Ht 70.0 in | Wt 237.5 lb

## 2016-09-04 DIAGNOSIS — J321 Chronic frontal sinusitis: Secondary | ICD-10-CM

## 2016-09-04 DIAGNOSIS — C50911 Malignant neoplasm of unspecified site of right female breast: Secondary | ICD-10-CM

## 2016-09-04 DIAGNOSIS — Z5112 Encounter for antineoplastic immunotherapy: Secondary | ICD-10-CM | POA: Diagnosis not present

## 2016-09-04 DIAGNOSIS — J329 Chronic sinusitis, unspecified: Secondary | ICD-10-CM | POA: Diagnosis not present

## 2016-09-04 DIAGNOSIS — C7802 Secondary malignant neoplasm of left lung: Secondary | ICD-10-CM | POA: Diagnosis not present

## 2016-09-04 DIAGNOSIS — G629 Polyneuropathy, unspecified: Secondary | ICD-10-CM

## 2016-09-04 DIAGNOSIS — C7800 Secondary malignant neoplasm of unspecified lung: Principal | ICD-10-CM

## 2016-09-04 DIAGNOSIS — Z171 Estrogen receptor negative status [ER-]: Secondary | ICD-10-CM | POA: Diagnosis not present

## 2016-09-04 DIAGNOSIS — R0789 Other chest pain: Secondary | ICD-10-CM

## 2016-09-04 DIAGNOSIS — G620 Drug-induced polyneuropathy: Secondary | ICD-10-CM

## 2016-09-04 DIAGNOSIS — T451X5A Adverse effect of antineoplastic and immunosuppressive drugs, initial encounter: Secondary | ICD-10-CM

## 2016-09-04 DIAGNOSIS — Z95828 Presence of other vascular implants and grafts: Secondary | ICD-10-CM

## 2016-09-04 LAB — COMPREHENSIVE METABOLIC PANEL
ALT: 26 U/L (ref 0–55)
AST: 19 U/L (ref 5–34)
Albumin: 3.5 g/dL (ref 3.5–5.0)
Alkaline Phosphatase: 73 U/L (ref 40–150)
Anion Gap: 9 mEq/L (ref 3–11)
BILIRUBIN TOTAL: 0.38 mg/dL (ref 0.20–1.20)
BUN: 13 mg/dL (ref 7.0–26.0)
CALCIUM: 9.8 mg/dL (ref 8.4–10.4)
CO2: 25 meq/L (ref 22–29)
Chloride: 106 mEq/L (ref 98–109)
Creatinine: 0.9 mg/dL (ref 0.6–1.1)
Glucose: 95 mg/dl (ref 70–140)
Potassium: 4.1 mEq/L (ref 3.5–5.1)
Sodium: 140 mEq/L (ref 136–145)
TOTAL PROTEIN: 7.5 g/dL (ref 6.4–8.3)

## 2016-09-04 LAB — CBC WITH DIFFERENTIAL/PLATELET
BASO%: 0.4 % (ref 0.0–2.0)
Basophils Absolute: 0 10*3/uL (ref 0.0–0.1)
EOS%: 2.4 % (ref 0.0–7.0)
Eosinophils Absolute: 0.1 10*3/uL (ref 0.0–0.5)
HCT: 37.2 % (ref 34.8–46.6)
HGB: 12.5 g/dL (ref 11.6–15.9)
LYMPH%: 28.6 % (ref 14.0–49.7)
MCH: 29.9 pg (ref 25.1–34.0)
MCHC: 33.7 g/dL (ref 31.5–36.0)
MCV: 88.9 fL (ref 79.5–101.0)
MONO#: 0.5 10*3/uL (ref 0.1–0.9)
MONO%: 7.9 % (ref 0.0–14.0)
NEUT#: 3.5 10*3/uL (ref 1.5–6.5)
NEUT%: 60.7 % (ref 38.4–76.8)
Platelets: 220 10*3/uL (ref 145–400)
RBC: 4.18 10*6/uL (ref 3.70–5.45)
RDW: 13.4 % (ref 11.2–14.5)
WBC: 5.7 10*3/uL (ref 3.9–10.3)
lymph#: 1.6 10*3/uL (ref 0.9–3.3)

## 2016-09-04 MED ORDER — SODIUM CHLORIDE 0.9 % IV SOLN
Freq: Once | INTRAVENOUS | Status: AC
  Administered 2016-09-04: 10:00:00 via INTRAVENOUS

## 2016-09-04 MED ORDER — TRASTUZUMAB CHEMO 150 MG IV SOLR
6.0000 mg/kg | Freq: Once | INTRAVENOUS | Status: AC
  Administered 2016-09-04: 609 mg via INTRAVENOUS
  Filled 2016-09-04: qty 29

## 2016-09-04 MED ORDER — ACETAMINOPHEN 325 MG PO TABS: 650.0000 mg | ORAL_TABLET | Freq: Once | ORAL | Status: DC

## 2016-09-04 MED ORDER — HEPARIN SOD (PORK) LOCK FLUSH 100 UNIT/ML IV SOLN
500.0000 [IU] | Freq: Once | INTRAVENOUS | Status: AC | PRN
  Administered 2016-09-04: 500 [IU]
  Filled 2016-09-04: qty 5

## 2016-09-04 MED ORDER — SODIUM CHLORIDE 0.9 % IJ SOLN
10.0000 mL | INTRAMUSCULAR | Status: DC | PRN
  Administered 2016-09-04: 10 mL
  Filled 2016-09-04: qty 10

## 2016-09-04 MED ORDER — SODIUM CHLORIDE 0.9 % IJ SOLN
10.0000 mL | INTRAMUSCULAR | Status: DC | PRN
  Administered 2016-09-04: 10 mL via INTRAVENOUS
  Filled 2016-09-04: qty 10

## 2016-09-04 NOTE — Patient Instructions (Signed)
Cancer Center Discharge Instructions for Patients Receiving Chemotherapy  Today you received the following chemotherapy agents: Herceptin   To help prevent nausea and vomiting after your treatment, we encourage you to take your nausea medication as directed.    If you develop nausea and vomiting that is not controlled by your nausea medication, call the clinic.   BELOW ARE SYMPTOMS THAT SHOULD BE REPORTED IMMEDIATELY:  *FEVER GREATER THAN 100.5 F  *CHILLS WITH OR WITHOUT FEVER  NAUSEA AND VOMITING THAT IS NOT CONTROLLED WITH YOUR NAUSEA MEDICATION  *UNUSUAL SHORTNESS OF BREATH  *UNUSUAL BRUISING OR BLEEDING  TENDERNESS IN MOUTH AND THROAT WITH OR WITHOUT PRESENCE OF ULCERS  *URINARY PROBLEMS  *BOWEL PROBLEMS  UNUSUAL RASH Items with * indicate a potential emergency and should be followed up as soon as possible.  Feel free to call the clinic you have any questions or concerns. The clinic phone number is [phone].  Please show the CHEMO ALERT CARD at check-in to the Emergency Department and triage nurse.   

## 2016-09-12 ENCOUNTER — Telehealth: Payer: Self-pay | Admitting: Hematology

## 2016-09-12 NOTE — Telephone Encounter (Signed)
Pt called to cxl lab/flush appt on 2/13 per MD orders. Changed infusion appt to 4 pm due to pt work schedule

## 2016-09-13 ENCOUNTER — Telehealth: Payer: Self-pay | Admitting: Hematology

## 2016-09-13 NOTE — Telephone Encounter (Signed)
spoke with patient to confirm next appointment dates on 09/25/16 at 4:00 pm and 10/16/16 at 9:45 am. patient inquired on dosage of magnesium she should be taking per Dr. Ernestina Penna orders. left a voice message with Dr. Ernestina Penna nurse to call patient back to discuss

## 2016-09-14 ENCOUNTER — Telehealth: Payer: Self-pay | Admitting: *Deleted

## 2016-09-14 NOTE — Telephone Encounter (Signed)
Received message from HIM/Tiffany that pt had asked yest what dose of magnesium should she take.  Discussed with Dr Burr Medico & she states just OTC dose for cramps.  Informed to discuss with pharmacy for best dosing.

## 2016-09-16 IMAGING — CT CT CHEST W/ CM
2 of 5 series · 15 of 46 positions shown, 17 images · IV contrast (iopamidol)
Comparison: CT the chest, abdomen and pelvis 11/22/2015.

CLINICAL DATA: 44-year-old female with history of right-sided
breast cancer diagnosed in 4943 with known lung metastasis. Oral
chemotherapy ongoing. Restaging examination.

EXAM:
CT CHEST, ABDOMEN, AND PELVIS WITH CONTRAST
TECHNIQUE: Multidetector CT imaging of the chest, abdomen and pelvis was
performed following the standard protocol during bolus
administration of intravenous contrast.
CONTRAST:  100mL CJOXQS-DLL IOPAMIDOL (CJOXQS-DLL) INJECTION 61%

[Series 2: cap with st · axial · 0.84mm/px · z∈[-228,+332]mm · 12 of 134 slices shown, 14 images]
[im 11/134  soft-tissue]
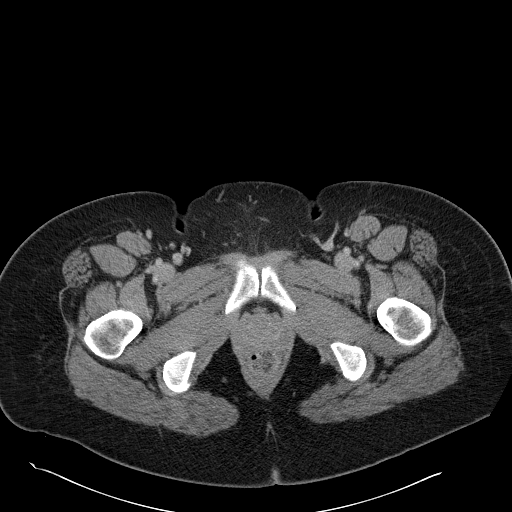
[im 11/134  bone]
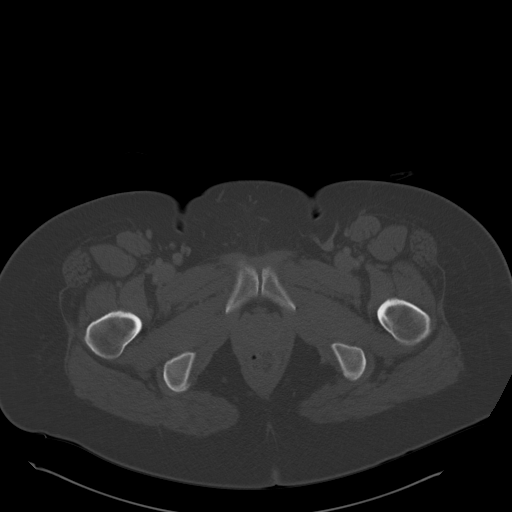
[im 21/134  soft-tissue]
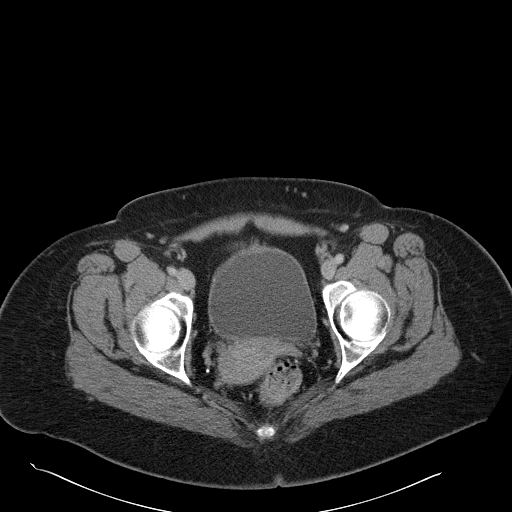
[im 31/134  soft-tissue]
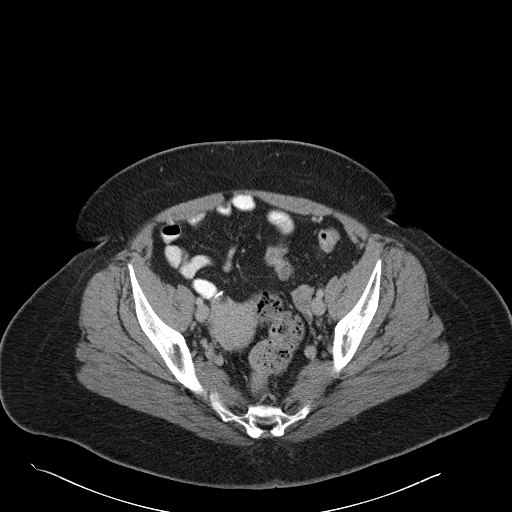
[im 41/134  soft-tissue]
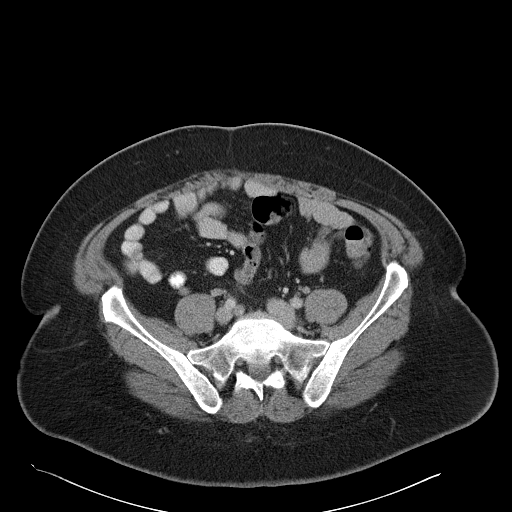
[im 52/134  soft-tissue]
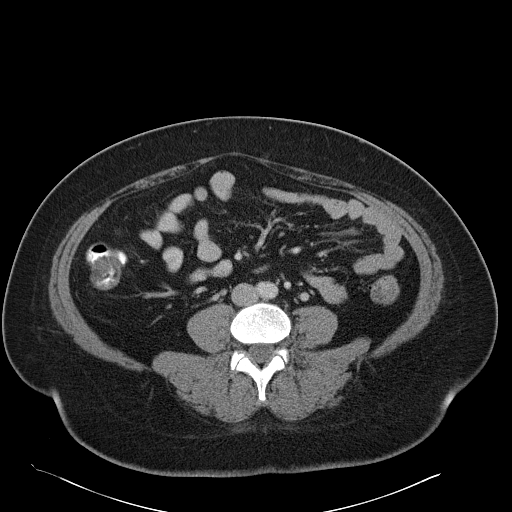
[im 62/134  soft-tissue]
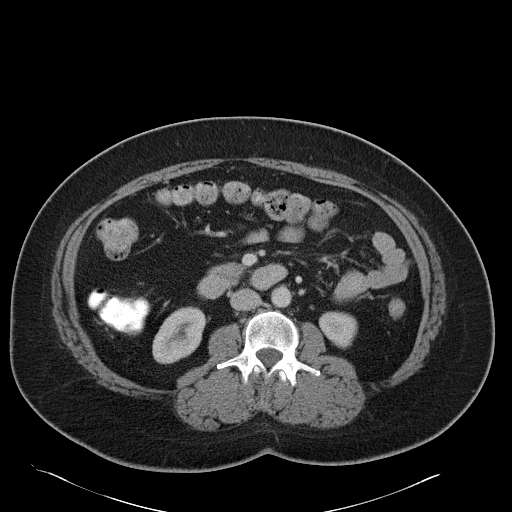
[im 72/134  soft-tissue]
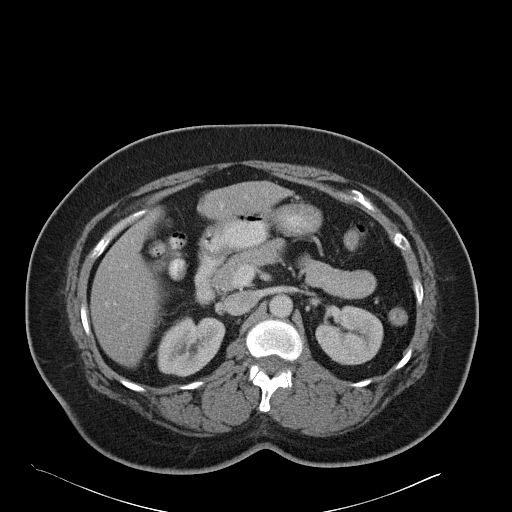
[im 82/134  soft-tissue]
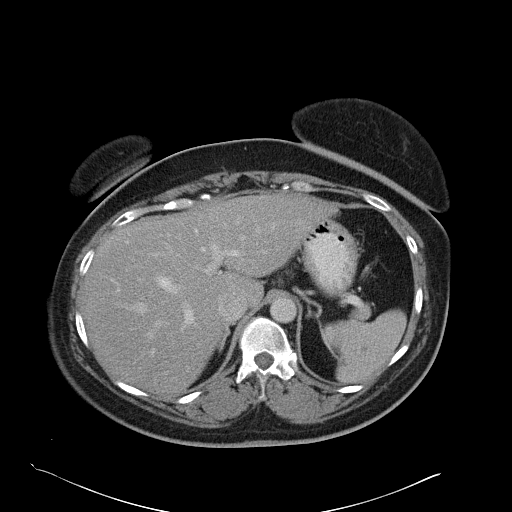
[im 93/134  soft-tissue]
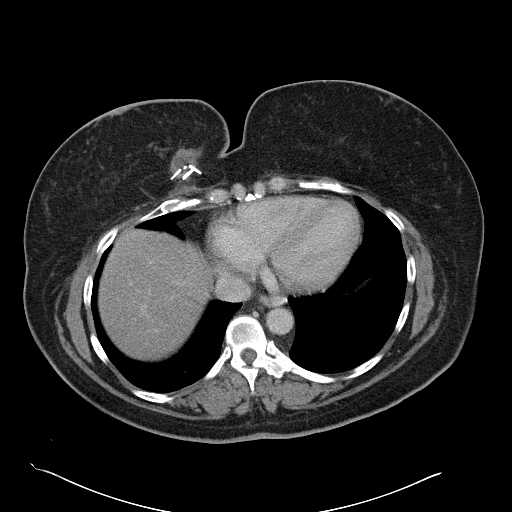
[im 93/134  bone]
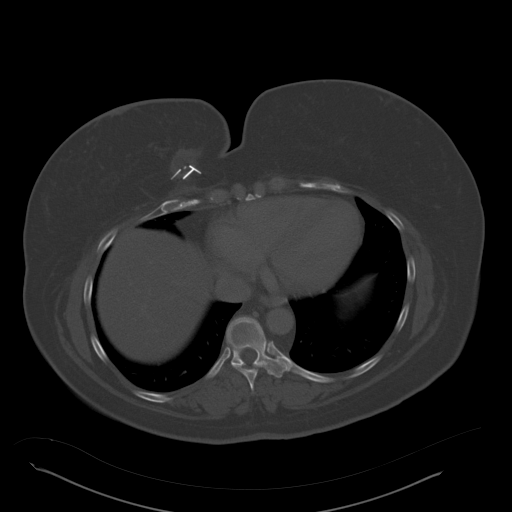
[im 103/134  soft-tissue]
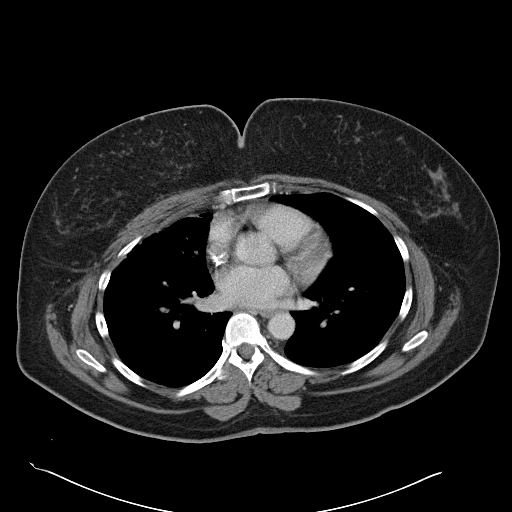
[im 113/134  soft-tissue]
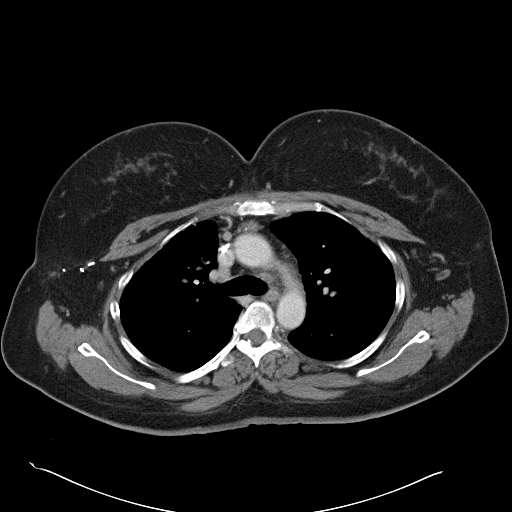
[im 123/134  soft-tissue]
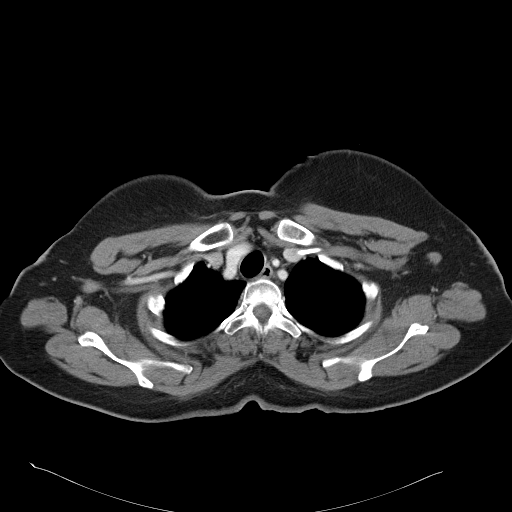

[Series 602: <mpr thick range> · coronal · 1.31mm/px · 3 of 161 slices shown]
[im 54/161  soft-tissue]
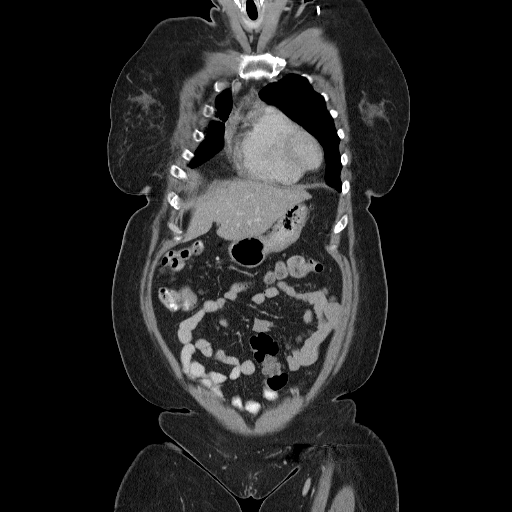
[im 72/161  soft-tissue]
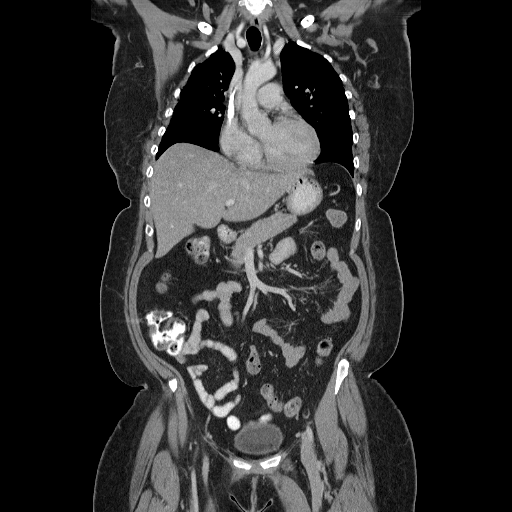
[im 89/161  soft-tissue]
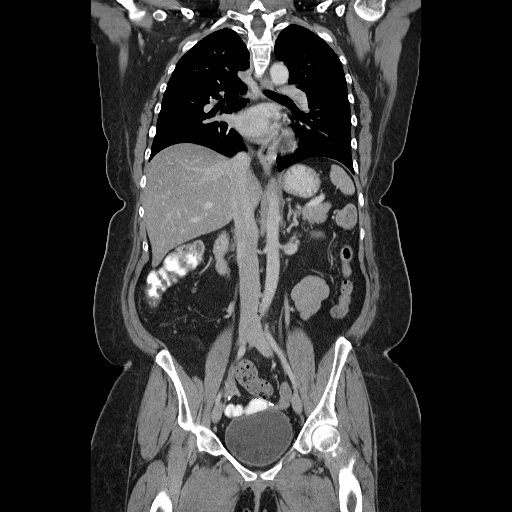

[15 of 46 positions shown; findings below may reference images not displayed]

FINDINGS: CT CHEST FINDINGS

Cardiovascular: Heart size is normal. There is no significant
pericardial fluid, thickening or pericardial calcification. Minimal
aortic atherosclerosis, without evidence of aneurysm or dissection.
No coronary artery calcifications. Left-sided subclavian single
lumen porta cath with tip terminating at the superior cavoatrial
junction.

Mediastinum/Nodes: Small amount of residual thymic tissue
incidentally noted. No pathologically enlarged mediastinal, internal
mammary or hilar lymph nodes. Esophagus is unremarkable in
appearance. No axillary lymphadenopathy. Surgical clips in the right
axillary region from prior lymph node dissection.

Lungs/Pleura: Focal area of nodular architectural distortion in the
central aspect of the left upper lobe is similar to prior
examinations, with a central nidus measuring 11 x 7 mm (image 58 of
series 4), on today's examination, compatible with a treated
metastatic lesion. No other new suspicious appearing pulmonary
nodules or masses are identified on today's study. There are some
areas of ground-glass attenuation and septal thickening in the
periphery of the right upper lobe, right middle lobe and in the apex
of the right upper lobe, all compatible with chronic postradiation
changes, similar to the prior examination. No acute consolidative
airspace disease. No pleural effusions.

Musculoskeletal: Postoperative changes of prior lumpectomy are noted
in the medial aspect of the right breast where there is a small
x 2.4 cm low-attenuation collection which has decreased in size
compared to the prior study, compatible with a resolving
postoperative seroma. There are no aggressive appearing lytic or
blastic lesions noted in the visualized portions of the skeleton.

CT ABDOMEN AND PELVIS FINDINGS

Hepatobiliary: Mild diffuse decreased attenuation throughout the
hepatic parenchyma, compatible with a background of mild hepatic
steatosis. No suspicious cystic or solid hepatic lesions. No intra
or extrahepatic biliary ductal dilatation. Status post
cholecystectomy.

Pancreas: No pancreatic mass. No pancreatic ductal dilatation. No
pancreatic or peripancreatic fluid or inflammatory changes.

Spleen: Unremarkable.

Adrenals/Urinary Tract: Bilateral adrenal glands are normal in
appearance. 4 mm nonobstructive calculus in the lower pole
collecting system of the right kidney. Bilateral kidneys are
otherwise normal in appearance. No ureteral calculi are identified.
No hydroureteronephrosis to indicate urinary tract obstruction at
this time. Urinary bladder is normal in appearance.

Stomach/Bowel: The appearance of the stomach is normal. There is no
pathologic dilatation of small bowel or colon. Normal appendix.

Vascular/Lymphatic: No significant atherosclerotic disease, aneurysm
or dissection identified in the abdominal or pelvic vasculature. No
lymphadenopathy noted in the abdomen or pelvis.

Reproductive: Uterus and ovaries are unremarkable in appearance.
Fallopian tube occluder devices are noted bilaterally.

Other: No significant volume of ascites.  No pneumoperitoneum.

Musculoskeletal: There are no aggressive appearing lytic or blastic
lesions noted in the visualized portions of the skeleton.
IMPRESSION: 1. Continued contraction of treated metastatic lesion in the left
upper lobe indicative of positive response to therapy.
2. No new signs of metastatic disease are noted elsewhere in the
chest, abdomen or pelvis.
3. Small resolving postoperative seroma in the medial aspect of the
right breast is decreased in size compared to the prior study.
4. Mild hepatic steatosis.
5. Additional incidental findings, as above.

## 2016-09-21 ENCOUNTER — Other Ambulatory Visit: Payer: Self-pay | Admitting: Hematology

## 2016-09-25 ENCOUNTER — Other Ambulatory Visit: Payer: 59

## 2016-09-25 ENCOUNTER — Ambulatory Visit (HOSPITAL_BASED_OUTPATIENT_CLINIC_OR_DEPARTMENT_OTHER): Payer: 59

## 2016-09-25 VITALS — BP 157/89 | HR 88 | Temp 98.8°F

## 2016-09-25 DIAGNOSIS — C7802 Secondary malignant neoplasm of left lung: Secondary | ICD-10-CM | POA: Diagnosis not present

## 2016-09-25 DIAGNOSIS — C50911 Malignant neoplasm of unspecified site of right female breast: Secondary | ICD-10-CM | POA: Diagnosis not present

## 2016-09-25 DIAGNOSIS — C7800 Secondary malignant neoplasm of unspecified lung: Principal | ICD-10-CM

## 2016-09-25 DIAGNOSIS — Z5112 Encounter for antineoplastic immunotherapy: Secondary | ICD-10-CM

## 2016-09-25 MED ORDER — HEPARIN SOD (PORK) LOCK FLUSH 100 UNIT/ML IV SOLN
500.0000 [IU] | Freq: Once | INTRAVENOUS | Status: AC | PRN
  Administered 2016-09-25: 500 [IU]
  Filled 2016-09-25: qty 5

## 2016-09-25 MED ORDER — TRASTUZUMAB CHEMO 150 MG IV SOLR
6.0000 mg/kg | Freq: Once | INTRAVENOUS | Status: AC
  Administered 2016-09-25: 609 mg via INTRAVENOUS
  Filled 2016-09-25: qty 29

## 2016-09-25 MED ORDER — SODIUM CHLORIDE 0.9 % IV SOLN
Freq: Once | INTRAVENOUS | Status: AC
  Administered 2016-09-25: 16:00:00 via INTRAVENOUS

## 2016-09-25 MED ORDER — SODIUM CHLORIDE 0.9 % IJ SOLN
10.0000 mL | INTRAMUSCULAR | Status: DC | PRN
  Administered 2016-09-25: 10 mL
  Filled 2016-09-25: qty 10

## 2016-09-25 NOTE — Patient Instructions (Signed)
[facility] Discharge Instructions for Patients Receiving Chemotherapy  Today you received the following chemotherapy agents: Herceptin   To help prevent nausea and vomiting after your treatment, we encourage you to take your nausea medication as directed.    If you develop nausea and vomiting that is not controlled by your nausea medication, call the clinic.   BELOW ARE SYMPTOMS THAT SHOULD BE REPORTED IMMEDIATELY:  *FEVER GREATER THAN 100.5 F  *CHILLS WITH OR WITHOUT FEVER  NAUSEA AND VOMITING THAT IS NOT CONTROLLED WITH YOUR NAUSEA MEDICATION  *UNUSUAL SHORTNESS OF BREATH  *UNUSUAL BRUISING OR BLEEDING  TENDERNESS IN MOUTH AND THROAT WITH OR WITHOUT PRESENCE OF ULCERS  *URINARY PROBLEMS  *BOWEL PROBLEMS  UNUSUAL RASH Items with * indicate a potential emergency and should be followed up as soon as possible.  Feel free to call the clinic you have any questions or concerns. The clinic phone number is [phone].  Please show the CHEMO ALERT CARD at check-in to the Emergency Department and triage nurse.   

## 2016-10-15 NOTE — Progress Notes (Signed)
[facility] Follow up note   Patient Care Team: Pcp Not In System as PCP - General [name], DO (Family Medicine) Holley Bouche, NP as Nurse Practitioner (Nurse Practitioner) Stark Klein, MD as Consulting Physician (General Surgery) Arloa Koh, MD as Consulting Physician (Radiation Oncology) [name], MD as Consulting Physician (Hematology)  CHIEF COMPLAINTS Follow up metastatic breast cancer  Oncology History   Breast cancer 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   Staging form: Breast, AJCC 7th Edition     Clinical stage from 07/22/2014: Stage IV (T3, N1, M1) - Unsigned       Breast cancer 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 ([place])   07/02/2014 Mammogram    Mammogram showed a 2cm right beast mass and a 1.8cm right axillary node. MRI breast on 07/16/2014 showed 7cm R breast lesion and 4.4cm r axillary node       07/02/2014 Imaging    CT CAP: a 4.7cm mass in LUL lung and a 2.1cm mas in RML, and a small nodule in RUL, suspecious for metastasis        07/09/2014 Initial Diagnosis    right IDA with b/l lung lesions, ER-/PR-/HER2+      07/09/2014 Initial Biopsy    US guided right breast mass and axillary node biopsy showed IDA, and DCIS, ER-/PR-/HER2+      07/26/2014 Pathologic Stage    Left lung mass by IR, path revealed high grade carcinoma, morphology similar to breast tumor biopsy, TTF(-), NapsinA(-), ER(-)      08/04/2014 - 03/22/2015 Chemotherapy    weekly Paclitaxel 46m/m2, trastuzumab and pertuzumab every 3 weeks      10/04/2014 Imaging    Interval decrease in the right axillary lymphadenopathy. Bilateral pulmonary lesions with left hilar lymphadenopathy also markedly decreased in the interval. The left hilar lymphadenopathy has resolved.      12/20/2014 Imaging    restaging CT showed stable disease, no new lesions       03/29/2015 Pathology Results     right breast lumpectomy showed  chemotherapy treatment effect,  a 1 mm residual tumor,    margins were widely negative, 5 sentinel lymph nodes and 2 axillary lymph nodes were negative.       03/29/2015 Surgery     right breast lumpectomy and sentinel lymph node biopsy  by Dr. BBarry Dienes     04/12/2015 -  Chemotherapy     Herceptin maintenance therapy , 6 mg/kg, every 3 weeks      05/03/2015 - 06/14/2015 Radiation Therapy    right breast adjuvant irradiation by Dr. MValere Dross      08/28/2016 Imaging    CT CAP w Contrast  IMPRESSION: 1. No acute process or evidence of metastatic disease within the chest, abdomen, or pelvis. 2. Similar to less well-defined left upper lobe density, likely scarring. No evidence of new or progressive pulmonary metastasis. 3. Right nephrolithiasis.       CURRENT THERAPY: Herceptin maintenance therapy every 3 weeks, started on 04/12/2015  INTERVAL HISTROY   NShefalireturns for follow-up and Herceptin treatment. She is doing well today. She reports that she still has some tingling in her fingers. Her sinuses have been much better lately. The cramping has also gotten much better. Denies any other concerns. She overall is doing very well.   MEDICAL HISTORY:  Past Medical History:  Diagnosis Date  . Breast cancer ([facility]   . [place]   . GERD (gastroesophageal reflux disease)    during pregnancy   .  Pneumonia    hx of pneumonia 08/2013   . S/P radiation therapy 05/03/2015 through 06/14/2015    Right breast 4680 cGy in 26 sessions, right breast boost 1000 cGy in 5 sessions. Right supraclavicular/axillary region 4680 cGy with a supplemental PA field to bring the axillary dose up to 4500 cGy in 26 sessions    SURGICAL HISTORY: Past Surgical History:  Procedure Laterality Date  . BREAST LUMPECTOMY WITH RADIOACTIVE SEED AND SENTINEL LYMPH NODE BIOPSY Right 03/29/2015   Procedure: BREAST LUMPECTOMY WITH RADIOACTIVE SEED AND SENTINEL LYMPH NODE BIOPSY;  Surgeon: Stark Klein, MD;  Location:  [facility];  Service: General;  Laterality: Right;  . CESAREAN SECTION    . CHOLECYSTECTOMY    . ESSURE TUBAL LIGATION    . PORTACATH PLACEMENT Left 07/21/2014   Procedure: INSERTION PORT-A-CATH;  Surgeon: Stark Klein, MD;  Location: [facility];  Service: General;  Laterality: Left;  . WISDOM TOOTH EXTRACTION      SOCIAL HISTORY: History   Social History  . Marital Status: Married    Spouse Name: N/A    Number of Children:  she has 2 daughters at the age of 39 and 76.   . Years of Education: N/A   Occupational History  .  works as Freight forwarder for a [employer].    Social History Main Topics  . Smoking status: Never Smoker   . Smokeless tobacco: Not on file  . Alcohol Use: Yes  . Drug Use: No  . Sexual Activity: No    FAMILY HISTORY: Family History  Problem Relation Age of Onset  . Breast cancer Mother 54  . Liver cancer Father 19  . Breast cancer Maternal Aunt 36  . Prostate cancer Maternal Grandfather   . Liver cancer Paternal Grandmother   . Prostate cancer Paternal Grandfather      GENETICS: 08/30/2014 BreastNext panel was negative. 17 genes including BRCA1, BRCA2, were negative for mutations.   ALLERGIES:  is allergic to adhesive [tape]; aspirin; and codeine.  MEDICATIONS:  Current Outpatient Prescriptions  Medication Sig Dispense Refill  . Calcium-Phosphorus-Vitamin D (CALCIUM GUMMIES PO) Take by mouth daily.    . Camphor-Eucalyptus-Menthol (VICKS VAPORUB EX) Apply 1 application topically at bedtime. Applies under nose, throat and on chest.    . cetirizine (ZYRTEC) 10 MG tablet Take 10 mg by mouth daily as needed for allergies (allergies).     . fexofenadine (ALLEGRA) 180 MG tablet Take 180 mg by mouth daily as needed.  4  . fluticasone (FLONASE) 50 MCG/ACT nasal spray   1  . lidocaine-prilocaine (EMLA) cream APPLY TOPICALLY TO THE AFFECTED AREA AS NEEDED 30 g 0  . loperamide (IMODIUM A-D) 2 MG tablet Take 1 tablet (2 mg total) by mouth 4 (four)  times daily as needed for diarrhea or loose stools. 30 tablet 0  . metaxalone (SKELAXIN) 800 MG tablet Take 1 tablet (800 mg total) by mouth 3 (three) times daily. 30 tablet 0  . Multiple Vitamins-Minerals (AIRBORNE PO) Take 1 tablet by mouth daily.    . Multiple Vitamins-Minerals (MULTIVITAMIN GUMMIES ADULT PO) Take 1 each by mouth daily. Women's Vitafusion gummie    . nystatin (NYSTATIN) powder Apply topically 2 (two) times daily. 15 g 2  . triamcinolone (KENALOG) 0.025 % ointment Apply 1 application topically 2 (two) times daily. 30 g 1  . albuterol (PROVENTIL HFA;VENTOLIN HFA) 108 (90 Base) MCG/ACT inhaler Inhale 2 puffs into the lungs every 6 (six) hours as needed for wheezing or  shortness of breath. (Patient not taking: Reported on 09/04/2016) 1 Inhaler 2  . hydrocortisone 2.5 % cream APPLY TOPICALLY TO THE AFFECTED AREA TWICE DAILY (Patient not taking: Reported on 09/04/2016) 454 g 0  . LORazepam (ATIVAN) 0.5 MG tablet Take 1 tablet (0.5 mg total) by mouth every 8 (eight) hours. (Patient not taking: Reported on 10/16/2016) 3 tablet 0   No current facility-administered medications for this visit.    Facility-Administered Medications Ordered in Other Visits  Medication Dose Route Frequency Provider Last Rate Last Dose  . acetaminophen (TYLENOL) tablet 650 mg  650 mg Oral Once [name], MD      . sodium chloride 0.9 % injection 10 mL  10 mL Intracatheter PRN [name], MD   10 mL at 05/01/16 1622    REVIEW OF SYSTEMS:   Constitutional: Denies fevers, chills or abnormal night sweats Eyes: Denies blurriness of vision, double vision or watery eyes Ears, nose, mouth, throat, and face: Denies mucositis or sore throat Respiratory: Occasional dry cough, no dyspnea or wheezes Cardiovascular: Denies palpitation, chest discomfort or lower extremity swelling Gastrointestinal:  Denies nausea, heartburn or change in bowel habits Skin: Negative Lymphatics: Denies new lymphadenopathy or easy  bruising Neurological:Denies numbness, new weaknesses (+) tingling in fingers Behavioral/Psych: Mood is stable, no new changes  All other systems were reviewed with the patient and are negative.  PHYSICAL EXAMINATION: ECOG PERFORMANCE STATUS: 0  BP (!) 142/86 (BP Location: Left Arm, Patient Position: Sitting)   Pulse 84   Temp 97.2 F (36.2 C) (Oral)   Wt 240 lb 8 oz (109.1 kg)   LMP 07/13/2016 (Exact Date)   SpO2 100%   BMI 34.51 kg/m   BP (!) 142/86 (BP Location: Left Arm, Patient Position: Sitting)   Pulse 84   Temp 97.2 F (36.2 C) (Oral)   Wt 240 lb 8 oz (109.1 kg)   LMP 07/13/2016 (Exact Date)   SpO2 100%   BMI 34.51 kg/m   GENERAL:alert, no distress and comfortable SKIN: skin color, texture, turgor are normal, no rashes or significant lesions EYES: normal, conjunctiva are pink and non-injected, sclera clear OROPHARYNX:no exudate, no erythema and lips, buccal mucosa, and tongue normal  NECK: supple, thyroid normal size, non-tender, without nodularity LYMPH:  no palpable lymphadenopathy in the cervical, axillary or inguinal LUNGS: clear to auscultation and percussion with normal breathing effort HEART: regular rate & rhythm and no murmurs and no lower extremity edema ABDOMEN:abdomen soft, non-tender and normal bowel sounds Musculoskeletal:no cyanosis of digits and no clubbing, no tenderness  PSYCH: alert & oriented x 3 with fluent speech NEURO: no focal motor/sensory deficits Breasts:  Status post right breast lumpectomy and sentinel lymph node biopsy, incisions have healed well. The previous palpable seroma has resolved. (+) mild skin pigmentation of right breast secondary to radiation. Palpation of both breasts  and axillas revealed no other obvious mass that I could appreciate.  Skin: No skin rashes.   LABORATORY DATA:  I have reviewed the data as listed CBC Latest Ref Rng & Units 10/16/2016 09/04/2016 08/14/2016  WBC 3.9 - 10.3 10e3/uL 6.6 5.7 10.8(H)  Hemoglobin  11.6 - 15.9 g/dL 12.7 12.5 13.0  Hematocrit 34.8 - 46.6 % 38.0 37.2 38.4  Platelets 145 - 400 10e3/uL 221 220 281    CMP Latest Ref Rng & Units 10/16/2016 09/04/2016 08/14/2016  Glucose 70 - 140 mg/dl 113 95 137  BUN 7.0 - 26.0 mg/dL 13.2 13.0 22.4  Creatinine 0.6 - 1.1 mg/dL 1.0 0.9 1.0  Sodium 136 - 145 mEq/L 141 140 140  Potassium 3.5 - 5.1 mEq/L 3.7 4.1 3.7  Chloride 101 - 111 mmol/L - - -  CO2 22 - 29 mEq/L 26 25 27   Calcium 8.4 - 10.4 mg/dL 9.4 9.8 9.8  Total Protein 6.4 - 8.3 g/dL 7.7 7.5 7.7  Total Bilirubin 0.20 - 1.20 mg/dL 0.50 0.38 0.26  Alkaline Phos 40 - 150 U/L 82 73 83  AST 5 - 34 U/L 20 19 14   ALT 0 - 55 U/L 30 26 20     PATHOLOGY REPORT 07/09/2014 #1 breast, right needle core biopsy more anterior -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 45)  Lung, biopsy, Left 07/26/2014 - HIGH GRADE CARCINOMA, SEE COMMENT. Microscopic Comment The carcinoma demonstrates the following immunophenotype: TTF-1 - negative expression. Napsin A- negative expression. CK5/6 - focal, moderate expression. estrogen receptor - negative expression. GCDFP- negative expression. The recent breast biopsy demonstrating Her2 amplified high grade invasive mammary carcinoma is noted ([id number]). Although the immunophenotype of the current case is non-sepcific, it strongly argues against primary lung adenocarcinoma. However, on re-review, the morphology of the current case is essentially identical to the primary mammary carcinoma. In lieu of further immunophenotyping, the tumor will be submitted for Her2 testing and the remaining tissue will be reserved for additional ancillary tumor testing. The results of the Her2 testing will be reported in an addendum. The case was discussed  with Dr Burr Medico on 07/28/2015 and 07/29/2015 HER2 POSITIVE   Diagnosis 03/29/2015 1. Breast, lumpectomy, Right - INVASIVE DUCTAL CARCINOMA, SEE COMMENT. - SEE TUMOR SYNOPTIC TEMPLATE BELOW. 2. Lymph node, sentinel, biopsy, Right axillary #1 - ONE LYMPH NODE, NEGATIVE FOR TUMOR (0/1). - SEE COMMENT. 3. Lymph node, sentinel, biopsy, Right axillary #2 - BENIGN FIBROFATTY SOFT TISSUE. - NEGATIVE FOR ATYPIA OR MALIGNANCY. - NEGATIVE FOR LYMPH NODE. 4. Lymph node, sentinel, biopsy, Right axillary #3 - ONE LYMPH NODE, NEGATIVE FOR TUMOR (0/1). - SEE COMMENT. 5. Lymph node, sentinel, biopsy, Right axillary #4 - ONE LYMPH NODE, NEGATIVE FOR TUMOR (0/1). - SEE COMMENT. 6. Lymph node, sentinel, biopsy, Right axillary #5 - ONE LYMPH NODE, NEGATIVE FOR TUMOR (0/1). - SEE COMMENT. 7. Lymph node, sentinel, biopsy, right axillary - ONE LYMPH NODE, NEGATIVE FOR TUMOR (0/1). - SEE COMMENT. 8. Lymph node, sentinel, biopsy, right axillary - ONE LYMPH NODE, NEGATIVE FOR TUMOR (0/1). - SEE COMMENT. 1 of 4 Amended copy Amended FINAL for ALINNA, SIPLE ([id number]) Microscopic Comment 1. BREAST, INVASIVE TUMOR, WITH LYMPH NODES PRESENT Specimen, including laterality and lymph node sampling (sentinel, non-sentinel): Right breast with sentinel and non-sentinel lymph node sampling Procedure: Lumpectomy Histologic type: Ductal, see comment Grade: 2 of 3, see comment. Tubule formation: 3 Nuclear pleomorphism: 3 Mitotic: 1 Tumor size (glass slide measurement): 1 mm, see comment Margins: Invasive, distance to closest margin: See comment In-situ, distance to closest margin: N/A If margin positive, focally or broadly: N/A Lymphovascular invasion: Absent Ductal carcinoma in situ: Absent Grade: N/A Extensive intraductal component: N/A Lobular neoplasia: Absent Tumor focality: Unifocal, see comment Treatment effect: Present If present, treatment effect in breast tissue, lymph nodes or both: Both  breast tissue and lymph nodes Extent of tumor: Skin: N/A Nipple: N/A Skeletal muscle: N/A Lymph nodes: Examined: 5 Sentinel 2 Non-sentinel 7 Total Lymph nodes with metastasis: 0 Isolated tumor  cells (< 0.2 mm): N/A Micrometastasis: (> 0.2 mm and < 2.0 mm): N/A Macrometastasis: (> 2.0 mm): N/A Extracapsular extension: N/A Breast prognostic profile: Estrogen receptor: Not repeated, previous study demonstrated 0% positivity ([id number]). Progesterone receptor: Not repeated, previous study demonstrated 0% positivity ([id number]) Her 2 neu: Demonstrates amplificaition ([id number]) Ki-67: Not repeated, previous study demonstrate 79% proliferation rate ([id number]). Non-neoplastic breast: Neoadjuvant related tissue changes and adenosis. TNM: ypT78m, ypN0, pMX Comments: Numerous representative sections including the 2.0 cm hemorrhagic tissue associated with X-shaped biopsy clip, the 2.0 cm ill defined lesion associated with M-shaped clip, and intervening tissue were submitted for review. Slide sections from the tissue surrounding the X-shaped and M-shaped clips demonstrate tissue changes consistent with neoadjuvant related change. There is no invasive or in situ carcinom present at either site. However, in a representative section from the intervening tissue (slide G), there is a 1 mm focus of high grade invasive ductal carcinoma present. Given the minute size of the tumor, tumor grading is limited. The tumor is present within non-marginal tissue sections and is considered to be at least 0.5 cm from the nearest margin (medial).   Note: Amendment issued for modification of synoptic table, inadvertent typographical error. The change in the synoptic table was discussed with Dr FBurr Medicoon 04/07/2015. (CRR:ecj 04/07/2015) 2. , 4-8. In parts 2 and 4, there is extensive neoadjuvant related tissue changes including abundant foamy macrophages, fibroinflammatory reaction and dystrophic calcifications.  The neoadjuvant tissue changes extend into perinodal soft tissue and are either focally or broadly present at the cauterized edge of the tissue submitted. There are no definitive features of malignancy present.  RADIOGRAPHIC STUDIES:  CT CAP w contrast 08/28/2016 IMPRESSION: 1. No acute process or evidence of metastatic disease within the chest, abdomen, or pelvis. 2. Similar to less well-defined left upper lobe density, likely scarring. No evidence of new or progressive pulmonary metastasis. 3. Right nephrolithiasis.  CT chest, abdomen and pelvis with contrast 05/17/2016 IMPRESSION: 1. Stable appearance of the chest, abdomen, and pelvis, with bandlike density with very minimal nodularity in the left upper lobe at the site of previously treated metastatic disease unchanged from prior. No findings of new metastatic disease. 2. Mild airway thickening and minimal airway plugging in the right lower lobe. 3. Other imaging findings of potential clinical significance: Mild thoracolumbar scoliosis. Diffuse hepatic steatosis. 4 mm right kidney lower pole nonobstructive calculus. Prominent stool throughout the colon favors constipation.  Bone scan 02/23/2016 IMPRESSION: No definite evidence of osseous metastatic disease.  Subtle increased uptake in the posterior lateral aspect of the right eighth rib merits further evaluation with a right rib x-ray series.  CT head w wo contrast 07/03/2016 IMPRESSION: No CT findings of intracranial metastasis though, MRI of the brain with contrast would be more sensitive for subtle abnormalities.  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  ECHO 05/22/2016 Study Conclusions  - Left ventricle: The cavity size was normal. Wall thickness was   normal. Systolic function was normal. The  estimated ejection   fraction was in the range of 60% to 65%. Wall motion was normal;   there were no regional wall motion abnormalities. Features are   consistent with a pseudonormal left ventricular filling pattern,   with concomitant abnormal relaxation and increased filling   pressure (grade 2 diastolic dysfunction). - Mitral valve: There was mild regurgitation.   ASSESSMENT & PLAN:  46  y.o.  African-American female, premenopausal, without significant past medical history, presented with palpable right breast mass and right axillary mass.   1. 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 - I previously reviewed her breasts surgical pathology results with her in great detail ,  She has had fantastic partial response (near complete response) form 8 months of chemotherapy and antibody therapy.   The initial 6.3 cm breast mass has only 1 mm residual tumor,  And the previously 4.4 cm right axillary lymph node has had complete  Pathological response, all 7 nodes were negative for cancer. -I previously reviewed her restaging CT scan from 08/28/2016, which showed no evidence of disease, the scar tissue in left upper lobe lung is unchanged. No suspicious bone lesions on the CT scan. -Continue monitoring cardiac echo every 4-6 months, she will follow up with Dr. BHaroldine Laws -lab reviewed, adequate for treatment, we'll continue Herceptin every 3 weeks -She had her mammogram done in December. The report is not in Epic, but I will request this. She says it was normal.  -repeat CT every 4 months. Last was 08/29/2016. Next around mid May 2018.   2. Chronic sinusitis  -She has had recurrent sinusitis, and bronchitis. Her symptom has resolved lately.   3. Mild peripheral  neuropathy, G1 -Probably related to her prior chemotherapy -Very mild, we'll continue monitoring.   4. Right sided chest pain -She has been having intermittent right-sided posterior low chest pain, radiates to her lower back. -She has some abnormal uptake on her bone scan in July 2017, possible old fracture. -Her pain could be muscular pain related to her chronic cough  -She has a kidney stone, which has been present since 2015, but I doubt her chest pains related to her case to him. -Her pain has near resolved lately   5. HTN? -No prior history of HTN, but her BP has been slightly elevated.  -She does not have a PCP, but she will find one to monitor this. -I encouraged a healthy diet and exercise. She agrees and is going to start walking after work, I also strongly encouraged her to lose weight  -Encouraged her to check her BP at home or at a pharmacy.   6. Obesity  -I encourage her to have healthy diet, exercise regularly, and try to lose some weight.  Plan  -Order repeat CT scan early May 2018 before her next f/u.  -Herceptin today and every 3 weeks. -Lab every 6 weeks  -Return for f/u in 9 weeks, around May 8.    I spent about 20 minutes counseling the patient and her family members, total care was about 25 minutes.  This document serves as a record of services personally performed by YTruitt Merle MD. It was created on her behalf by [email or web address], a trained medical scribe. The creation of this record is based on the scribe's personal observations and the provider's statements to them. This document has been checked and approved by the attending provider.  I have reviewed the above documentation for accuracy and completeness and I agree with the above.  FTruitt Merle 10/16/2016

## 2016-10-16 ENCOUNTER — Encounter: Payer: Self-pay | Admitting: Hematology

## 2016-10-16 ENCOUNTER — Other Ambulatory Visit (HOSPITAL_BASED_OUTPATIENT_CLINIC_OR_DEPARTMENT_OTHER): Payer: 59

## 2016-10-16 ENCOUNTER — Ambulatory Visit (HOSPITAL_BASED_OUTPATIENT_CLINIC_OR_DEPARTMENT_OTHER): Payer: 59

## 2016-10-16 ENCOUNTER — Telehealth: Payer: Self-pay | Admitting: *Deleted

## 2016-10-16 ENCOUNTER — Telehealth: Payer: Self-pay | Admitting: Hematology

## 2016-10-16 ENCOUNTER — Ambulatory Visit (HOSPITAL_BASED_OUTPATIENT_CLINIC_OR_DEPARTMENT_OTHER): Payer: 59 | Admitting: Hematology

## 2016-10-16 ENCOUNTER — Ambulatory Visit: Payer: 59

## 2016-10-16 VITALS — BP 142/86 | HR 84 | Temp 97.2°F | Wt 240.5 lb

## 2016-10-16 DIAGNOSIS — G629 Polyneuropathy, unspecified: Secondary | ICD-10-CM

## 2016-10-16 DIAGNOSIS — Z5112 Encounter for antineoplastic immunotherapy: Secondary | ICD-10-CM | POA: Diagnosis not present

## 2016-10-16 DIAGNOSIS — Z171 Estrogen receptor negative status [ER-]: Secondary | ICD-10-CM

## 2016-10-16 DIAGNOSIS — C7800 Secondary malignant neoplasm of unspecified lung: Secondary | ICD-10-CM | POA: Diagnosis not present

## 2016-10-16 DIAGNOSIS — C50911 Malignant neoplasm of unspecified site of right female breast: Secondary | ICD-10-CM | POA: Diagnosis not present

## 2016-10-16 DIAGNOSIS — E669 Obesity, unspecified: Secondary | ICD-10-CM

## 2016-10-16 DIAGNOSIS — C773 Secondary and unspecified malignant neoplasm of axilla and upper limb lymph nodes: Secondary | ICD-10-CM | POA: Diagnosis not present

## 2016-10-16 LAB — CBC WITH DIFFERENTIAL/PLATELET
BASO%: 0.5 % (ref 0.0–2.0)
BASOS ABS: 0 10*3/uL (ref 0.0–0.1)
EOS%: 1.6 % (ref 0.0–7.0)
Eosinophils Absolute: 0.1 10*3/uL (ref 0.0–0.5)
HCT: 38 % (ref 34.8–46.6)
HGB: 12.7 g/dL (ref 11.6–15.9)
LYMPH%: 26.7 % (ref 14.0–49.7)
MCH: 29.6 pg (ref 25.1–34.0)
MCHC: 33.5 g/dL (ref 31.5–36.0)
MCV: 88.2 fL (ref 79.5–101.0)
MONO#: 0.4 10*3/uL (ref 0.1–0.9)
MONO%: 6.1 % (ref 0.0–14.0)
NEUT#: 4.3 10*3/uL (ref 1.5–6.5)
NEUT%: 65.1 % (ref 38.4–76.8)
Platelets: 221 10*3/uL (ref 145–400)
RBC: 4.31 10*6/uL (ref 3.70–5.45)
RDW: 13.5 % (ref 11.2–14.5)
WBC: 6.6 10*3/uL (ref 3.9–10.3)
lymph#: 1.8 10*3/uL (ref 0.9–3.3)

## 2016-10-16 LAB — COMPREHENSIVE METABOLIC PANEL
ALBUMIN: 3.7 g/dL (ref 3.5–5.0)
ALK PHOS: 82 U/L (ref 40–150)
ALT: 30 U/L (ref 0–55)
AST: 20 U/L (ref 5–34)
Anion Gap: 9 mEq/L (ref 3–11)
BUN: 13.2 mg/dL (ref 7.0–26.0)
CHLORIDE: 106 meq/L (ref 98–109)
CO2: 26 mEq/L (ref 22–29)
Calcium: 9.4 mg/dL (ref 8.4–10.4)
Creatinine: 1 mg/dL (ref 0.6–1.1)
EGFR: 78 mL/min/{1.73_m2} — ABNORMAL LOW (ref >90)
GLUCOSE: 113 mg/dL (ref 70–140)
POTASSIUM: 3.7 meq/L (ref 3.5–5.1)
SODIUM: 141 meq/L (ref 136–145)
Total Bilirubin: 0.5 mg/dL (ref 0.20–1.20)
Total Protein: 7.7 g/dL (ref 6.4–8.3)

## 2016-10-16 MED ORDER — TRASTUZUMAB CHEMO 150 MG IV SOLR
6.0000 mg/kg | Freq: Once | INTRAVENOUS | Status: AC
  Administered 2016-10-16: 609 mg via INTRAVENOUS
  Filled 2016-10-16: qty 29

## 2016-10-16 MED ORDER — SODIUM CHLORIDE 0.9 % IV SOLN
Freq: Once | INTRAVENOUS | Status: AC
  Administered 2016-10-16: 12:00:00 via INTRAVENOUS

## 2016-10-16 MED ORDER — HEPARIN SOD (PORK) LOCK FLUSH 100 UNIT/ML IV SOLN
500.0000 [IU] | Freq: Once | INTRAVENOUS | Status: AC | PRN
  Administered 2016-10-16: 500 [IU]
  Filled 2016-10-16: qty 5

## 2016-10-16 MED ORDER — ACETAMINOPHEN 325 MG PO TABS: 650.0000 mg | ORAL_TABLET | Freq: Once | ORAL | Status: DC

## 2016-10-16 MED ORDER — SODIUM CHLORIDE 0.9 % IJ SOLN
10.0000 mL | INTRAMUSCULAR | Status: DC | PRN
  Administered 2016-10-16: 10 mL
  Filled 2016-10-16: qty 10

## 2016-10-16 NOTE — Telephone Encounter (Signed)
Per patient request I have moved appts  

## 2016-10-16 NOTE — Patient Instructions (Signed)

## 2016-10-16 NOTE — Telephone Encounter (Signed)
Appointments scheduled per 3/6 LOS. Patient will pick up printouts in infusion room.

## 2016-10-16 NOTE — Patient Instructions (Signed)
[facility] Discharge Instructions for Patients Receiving Chemotherapy  Today you received the following chemotherapy agents: Herceptin   To help prevent nausea and vomiting after your treatment, we encourage you to take your nausea medication as directed.    If you develop nausea and vomiting that is not controlled by your nausea medication, call the clinic.   BELOW ARE SYMPTOMS THAT SHOULD BE REPORTED IMMEDIATELY:  *FEVER GREATER THAN 100.5 F  *CHILLS WITH OR WITHOUT FEVER  NAUSEA AND VOMITING THAT IS NOT CONTROLLED WITH YOUR NAUSEA MEDICATION  *UNUSUAL SHORTNESS OF BREATH  *UNUSUAL BRUISING OR BLEEDING  TENDERNESS IN MOUTH AND THROAT WITH OR WITHOUT PRESENCE OF ULCERS  *URINARY PROBLEMS  *BOWEL PROBLEMS  UNUSUAL RASH Items with * indicate a potential emergency and should be followed up as soon as possible.  Feel free to call the clinic you have any questions or concerns. The clinic phone number is [phone].  Please show the CHEMO ALERT CARD at check-in to the Emergency Department and triage nurse.   

## 2016-10-17 LAB — CANCER ANTIGEN 27.29: CAN 27.29: 14.6 U/mL (ref 0.0–38.6)

## 2016-11-06 ENCOUNTER — Ambulatory Visit (HOSPITAL_BASED_OUTPATIENT_CLINIC_OR_DEPARTMENT_OTHER): Payer: 59

## 2016-11-06 VITALS — BP 152/77 | HR 85 | Temp 98.8°F | Resp 18

## 2016-11-06 DIAGNOSIS — C50911 Malignant neoplasm of unspecified site of right female breast: Secondary | ICD-10-CM

## 2016-11-06 DIAGNOSIS — Z5112 Encounter for antineoplastic immunotherapy: Secondary | ICD-10-CM | POA: Diagnosis not present

## 2016-11-06 DIAGNOSIS — C7800 Secondary malignant neoplasm of unspecified lung: Secondary | ICD-10-CM

## 2016-11-06 MED ORDER — HEPARIN SOD (PORK) LOCK FLUSH 100 UNIT/ML IV SOLN
500.0000 [IU] | Freq: Once | INTRAVENOUS | Status: AC | PRN
  Administered 2016-11-06: 500 [IU]
  Filled 2016-11-06: qty 5

## 2016-11-06 MED ORDER — ACETAMINOPHEN 325 MG PO TABS
ORAL_TABLET | ORAL | Status: AC
Start: 2016-11-06 — End: 2016-11-06
  Filled 2016-11-06: qty 2

## 2016-11-06 MED ORDER — TRASTUZUMAB CHEMO 150 MG IV SOLR
6.0000 mg/kg | Freq: Once | INTRAVENOUS | Status: AC
  Administered 2016-11-06: 609 mg via INTRAVENOUS
  Filled 2016-11-06: qty 29

## 2016-11-06 MED ORDER — SODIUM CHLORIDE 0.9 % IV SOLN
Freq: Once | INTRAVENOUS | Status: AC
  Administered 2016-11-06: 16:00:00 via INTRAVENOUS

## 2016-11-06 MED ORDER — SODIUM CHLORIDE 0.9 % IJ SOLN
10.0000 mL | INTRAMUSCULAR | Status: DC | PRN
  Administered 2016-11-06: 10 mL
  Filled 2016-11-06: qty 10

## 2016-11-06 MED ORDER — ACETAMINOPHEN 325 MG PO TABS
650.0000 mg | ORAL_TABLET | Freq: Once | ORAL | Status: AC
  Administered 2016-11-06: 650 mg via ORAL

## 2016-11-06 NOTE — Patient Instructions (Signed)
[facility] Discharge Instructions for Patients Receiving Chemotherapy  Today you received the following chemotherapy agents: Herceptin   To help prevent nausea and vomiting after your treatment, we encourage you to take your nausea medication as directed.    If you develop nausea and vomiting that is not controlled by your nausea medication, call the clinic.   BELOW ARE SYMPTOMS THAT SHOULD BE REPORTED IMMEDIATELY:  *FEVER GREATER THAN 100.5 F  *CHILLS WITH OR WITHOUT FEVER  NAUSEA AND VOMITING THAT IS NOT CONTROLLED WITH YOUR NAUSEA MEDICATION  *UNUSUAL SHORTNESS OF BREATH  *UNUSUAL BRUISING OR BLEEDING  TENDERNESS IN MOUTH AND THROAT WITH OR WITHOUT PRESENCE OF ULCERS  *URINARY PROBLEMS  *BOWEL PROBLEMS  UNUSUAL RASH Items with * indicate a potential emergency and should be followed up as soon as possible.  Feel free to call the clinic you have any questions or concerns. The clinic phone number is [phone].  Please show the CHEMO ALERT CARD at check-in to the Emergency Department and triage nurse.   

## 2016-11-27 ENCOUNTER — Other Ambulatory Visit: Payer: 59

## 2016-11-27 ENCOUNTER — Ambulatory Visit (HOSPITAL_BASED_OUTPATIENT_CLINIC_OR_DEPARTMENT_OTHER): Payer: 59

## 2016-11-27 ENCOUNTER — Other Ambulatory Visit (HOSPITAL_BASED_OUTPATIENT_CLINIC_OR_DEPARTMENT_OTHER): Payer: 59

## 2016-11-27 VITALS — BP 144/93 | HR 94 | Temp 98.3°F | Resp 18

## 2016-11-27 DIAGNOSIS — C7800 Secondary malignant neoplasm of unspecified lung: Secondary | ICD-10-CM

## 2016-11-27 DIAGNOSIS — C50911 Malignant neoplasm of unspecified site of right female breast: Secondary | ICD-10-CM | POA: Diagnosis not present

## 2016-11-27 DIAGNOSIS — Z5111 Encounter for antineoplastic chemotherapy: Secondary | ICD-10-CM | POA: Diagnosis not present

## 2016-11-27 LAB — CBC WITH DIFFERENTIAL/PLATELET
BASO%: 0.1 % (ref 0.0–2.0)
Basophils Absolute: 0 10*3/uL (ref 0.0–0.1)
EOS ABS: 0.1 10*3/uL (ref 0.0–0.5)
EOS%: 1.6 % (ref 0.0–7.0)
HEMATOCRIT: 35.6 % (ref 34.8–46.6)
HEMOGLOBIN: 11.9 g/dL (ref 11.6–15.9)
LYMPH%: 26.4 % (ref 14.0–49.7)
MCH: 30 pg (ref 25.1–34.0)
MCHC: 33.4 g/dL (ref 31.5–36.0)
MCV: 89.7 fL (ref 79.5–101.0)
MONO#: 0.4 10*3/uL (ref 0.1–0.9)
MONO%: 5.1 % (ref 0.0–14.0)
NEUT%: 66.8 % (ref 38.4–76.8)
NEUTROS ABS: 4.7 10*3/uL (ref 1.5–6.5)
PLATELETS: 222 10*3/uL (ref 145–400)
RBC: 3.97 10*6/uL (ref 3.70–5.45)
RDW: 13.7 % (ref 11.2–14.5)
WBC: 7 10*3/uL (ref 3.9–10.3)
lymph#: 1.9 10*3/uL (ref 0.9–3.3)

## 2016-11-27 LAB — COMPREHENSIVE METABOLIC PANEL
ALBUMIN: 3.6 g/dL (ref 3.5–5.0)
ALK PHOS: 77 U/L (ref 40–150)
ALT: 32 U/L (ref 0–55)
ANION GAP: 12 meq/L — AB (ref 3–11)
AST: 25 U/L (ref 5–34)
BILIRUBIN TOTAL: 0.37 mg/dL (ref 0.20–1.20)
BUN: 13.8 mg/dL (ref 7.0–26.0)
CALCIUM: 9.5 mg/dL (ref 8.4–10.4)
CO2: 24 mEq/L (ref 22–29)
Chloride: 107 mEq/L (ref 98–109)
Creatinine: 0.8 mg/dL (ref 0.6–1.1)
EGFR: >90 mL/min/{1.73_m2} (ref >90)
Glucose: 148 mg/dl — ABNORMAL HIGH (ref 70–140)
Potassium: 3.9 mEq/L (ref 3.5–5.1)
SODIUM: 143 meq/L (ref 136–145)
TOTAL PROTEIN: 7.7 g/dL (ref 6.4–8.3)

## 2016-11-27 MED ORDER — HEPARIN SOD (PORK) LOCK FLUSH 100 UNIT/ML IV SOLN
500.0000 [IU] | Freq: Once | INTRAVENOUS | Status: AC | PRN
  Administered 2016-11-27: 500 [IU]
  Filled 2016-11-27: qty 5

## 2016-11-27 MED ORDER — SODIUM CHLORIDE 0.9 % IV SOLN
Freq: Once | INTRAVENOUS | Status: AC
  Administered 2016-11-27: 16:00:00 via INTRAVENOUS

## 2016-11-27 MED ORDER — TRASTUZUMAB CHEMO 150 MG IV SOLR
6.0000 mg/kg | Freq: Once | INTRAVENOUS | Status: AC
  Administered 2016-11-27: 609 mg via INTRAVENOUS
  Filled 2016-11-27: qty 29

## 2016-11-27 MED ORDER — ACETAMINOPHEN 325 MG PO TABS: 650.0000 mg | ORAL_TABLET | Freq: Once | ORAL | Status: DC

## 2016-11-27 MED ORDER — SODIUM CHLORIDE 0.9 % IJ SOLN
10.0000 mL | INTRAMUSCULAR | Status: DC | PRN
  Administered 2016-11-27: 10 mL
  Filled 2016-11-27: qty 10

## 2016-11-27 NOTE — Progress Notes (Signed)
Per Meredeth Ide, RN per Dr. Burr Medico okay to treat today with ECHO from 05/22/16. Patient to have one scheduled before next week.

## 2016-11-27 NOTE — Patient Instructions (Signed)
[facility] Discharge Instructions for Patients Receiving Chemotherapy  Today you received the following chemotherapy agents: Heceptin   To help prevent nausea and vomiting after your treatment, we encourage you to take your nausea medication as directed.    If you develop nausea and vomiting that is not controlled by your nausea medication, call the clinic.   BELOW ARE SYMPTOMS THAT SHOULD BE REPORTED IMMEDIATELY:  *FEVER GREATER THAN 100.5 F  *CHILLS WITH OR WITHOUT FEVER  NAUSEA AND VOMITING THAT IS NOT CONTROLLED WITH YOUR NAUSEA MEDICATION  *UNUSUAL SHORTNESS OF BREATH  *UNUSUAL BRUISING OR BLEEDING  TENDERNESS IN MOUTH AND THROAT WITH OR WITHOUT PRESENCE OF ULCERS  *URINARY PROBLEMS  *BOWEL PROBLEMS  UNUSUAL RASH Items with * indicate a potential emergency and should be followed up as soon as possible.  Feel free to call the clinic you have any questions or concerns. The clinic phone number is [phone].  Please show the CHEMO ALERT CARD at check-in to the Emergency Department and triage nurse.   

## 2016-12-12 ENCOUNTER — Ambulatory Visit (HOSPITAL_COMMUNITY)
Admission: RE | Admit: 2016-12-12 | Discharge: 2016-12-12 | Disposition: A | Discharge status: Home / Self Care | Payer: 59 | Source: Ambulatory Visit | Attending: Internal Medicine | Admitting: Internal Medicine

## 2016-12-12 VITALS — BP 156/86 | HR 77

## 2016-12-12 DIAGNOSIS — I340 Nonrheumatic mitral (valve) insufficiency: Secondary | ICD-10-CM | POA: Diagnosis not present

## 2016-12-12 DIAGNOSIS — C50011 Malignant neoplasm of nipple and areola, right female breast: Secondary | ICD-10-CM | POA: Diagnosis not present

## 2016-12-12 DIAGNOSIS — I071 Rheumatic tricuspid insufficiency: Secondary | ICD-10-CM | POA: Insufficient documentation

## 2016-12-12 DIAGNOSIS — C50919 Malignant neoplasm of unspecified site of unspecified female breast: Secondary | ICD-10-CM

## 2016-12-12 DIAGNOSIS — C7800 Secondary malignant neoplasm of unspecified lung: Principal | ICD-10-CM

## 2016-12-12 DIAGNOSIS — I10 Essential (primary) hypertension: Secondary | ICD-10-CM

## 2016-12-12 LAB — ECHOCARDIOGRAM COMPLETE
CHL CUP DOP CALC LVOT VTI: 21.8 cm
CHL CUP MV DEC (S): 250
E/e' ratio: 6.79
EWDT: 250 ms
FS: 25 % — AB (ref 28–44)
IV/PV OW: 0.91
LA ID, A-P, ES: 35 mm
LA vol A4C: 61 ml
LA vol index: 22.5 mL/m2
LA vol: 50.9 mL
LADIAMINDEX: 1.55 cm/m2
LDCA: 2.84 cm2
LEFT ATRIUM END SYS DIAM: 35 mm
LV PW d: 9.92 mm — AB (ref 0.6–1.1)
LV e' LATERAL: 13.4 cm/s
LVEEAVG: 6.79
LVEEMED: 6.79
LVOT peak grad rest: 3 mmHg
LVOT peak vel: 91.1 cm/s
LVOTD: 19 mm
LVOTSV: 62 mL
MV Peak grad: 3 mmHg
MV pk E vel: 91 m/s
MVPKAVEL: 97.6 m/s
RV LATERAL S' VELOCITY: 12.9 cm/s
TAPSE: 24.8 mm
TDI e' lateral: 13.4
TDI e' medial: 9.46

## 2016-12-12 NOTE — Progress Notes (Signed)
Patient ID: [name] [name], female   DOB: May 29, 1971, 46 y.o.   MRN: [id number]   [facility] NOTE  Patient Care Team: Pcp Not In System as PCP - General [name], DO (Family Medicine) Holley Bouche, NP as Nurse Practitioner (Nurse Practitioner) Stark Klein, MD as Consulting Physician (General Surgery) Arloa Koh, MD as Consulting Physician (Radiation Oncology) [name], MD as Consulting Physician (Hematology)  HPI:   [name] [name] is a 46 y/o woman with Stage IV breast CA referred by Dr. Burr Medico for enrollment into the cardio-oncology clinic for surveillance while receiving Herceptin (started 04/12/15 and on indefinitiely)  Oncology History   Breast cancer 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   Staging form: Breast, AJCC 7th Edition     Clinical stage from 07/22/2014: Stage IV (T3, N1, M1) - Unsigned       Breast cancer 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 ([place])   07/02/2014 Mammogram    Mammogram showed a 2cm right beast mass and a 1.8cm right axillary node. MRI breast on 07/16/2014 showed 7cm R breast lesion and 4.4cm r axillary node       07/02/2014 Imaging    CT CAP: a 4.7cm mass in LUL lung and a 2.1cm mas in RML, and a small nodule in RUL, suspecious for metastasis        07/09/2014 Initial Diagnosis    right IDA with b/l lung lesions, ER-/PR-/HER2+      07/09/2014 Initial Biopsy    US guided right breast mass and axillary node biopsy showed IDA, and DCIS, ER-/PR-/HER2+      07/26/2014 Pathologic Stage    Left lung mass by IR, path revealed high grade carcinoma, morphology similar to breast tumor biopsy, TTF(-), NapsinA(-), ER(-)      08/04/2014 - 03/22/2015 Chemotherapy    weekly Paclitaxel 5m/m2, trastuzumab and pertuzumab every 3 weeks      10/04/2014 Imaging    Interval decrease in the right axillary lymphadenopathy. Bilateral pulmonary lesions with left hilar lymphadenopathy also markedly decreased in the interval. The left hilar lymphadenopathy has resolved.      12/20/2014 Imaging    restaging CT showed stable disease, no new lesions       03/29/2015 Pathology Results     right breast lumpectomy showed  chemotherapy treatment effect,  a 1 mm residual tumor,   margins were widely negative, 5 sentinel lymph nodes and 2 axillary lymph nodes were negative.       03/29/2015 Surgery     right breast lumpectomy and sentinel lymph node biopsy  by Dr. BBarry Dienes     04/12/2015 -  Chemotherapy     Herceptin maintenance therapy , 6 mg/kg, every 3 weeks      05/03/2015 - 06/14/2015 Radiation Therapy    right breast adjuvant irradiation by Dr. MValere Dross      08/28/2016 Imaging    CT CAP w Contrast  IMPRESSION: 1. No acute process or evidence of metastatic disease within the chest, abdomen, or pelvis. 2. Similar to less well-defined left upper lobe density, likely scarring. No evidence of new or progressive pulmonary metastasis. 3. Right nephrolithiasis.       Continues to receive herceptin for metastatic breast CA (has been receiving Herceptin since 8/16).  No trouble with infusions. No CP, SOB, edema, orthopnea or PND. BP had been running in 140-150 range. Says it was better when she was walking more.   Echo today reviewed personally EF 60% LS' 11.9 cm/s GLS -19.7  Echo 3/16 55-60% Lat s'  12.0 cm/sec GLS -17.5% Echo 2/17 55-60%  Lat s' 11.2 cm/sec GLS -17.2% Echo 01/18/16 EF 60-65% lat s' 7.9 cm/sec (out of plane) GLS -20.2% Echo 05/22/16 EF 60-65%  Lat s'11.2cm.sec GLS underestimated due to poor endocardial tracking  MEDICAL HISTORY:  Past Medical History:  Diagnosis Date  . Breast cancer ([place])   . Breast cancer ([place])   . GERD (gastroesophageal reflux disease)    during pregnancy   . Pneumonia    hx of pneumonia 08/2013   . S/P radiation therapy 05/03/2015 through 06/14/2015    Right breast 4680 cGy in 26 sessions, right breast boost 1000 cGy in 5 sessions. Right supraclavicular/axillary region 4680  cGy with a supplemental PA field to bring the axillary dose up to 4500 cGy in 26 sessions    SURGICAL HISTORY: Past Surgical History:  Procedure Laterality Date  . BREAST LUMPECTOMY WITH RADIOACTIVE SEED AND SENTINEL LYMPH NODE BIOPSY Right 03/29/2015   Procedure: BREAST LUMPECTOMY WITH RADIOACTIVE SEED AND SENTINEL LYMPH NODE BIOPSY;  Surgeon: Stark Klein, MD;  Location: [facility];  Service: General;  Laterality: Right;  . CESAREAN SECTION    . CHOLECYSTECTOMY    . ESSURE TUBAL LIGATION    . PORTACATH PLACEMENT Left 07/21/2014   Procedure: INSERTION PORT-A-CATH;  Surgeon: Stark Klein, MD;  Location: [facility];  Service: General;  Laterality: Left;  . WISDOM TOOTH EXTRACTION      SOCIAL HISTORY: History   Social History  . Marital Status: Married    Spouse Name: N/A    Number of Children:  she has 2 daughters at the age of 48 and 26.   . Years of Education: N/A   Occupational History  .  works as Freight forwarder for a [employer].    Social History Main Topics  . Smoking status: Never Smoker   . Smokeless tobacco: Not on file  . Alcohol Use: Yes  . Drug Use: No  . Sexual Activity: No    FAMILY HISTORY: Family History  Problem Relation Age of Onset  . Breast cancer Mother 86  . Liver cancer Father 55  . Breast cancer Maternal Aunt 36  . Prostate cancer Maternal Grandfather   . Liver cancer Paternal Grandmother   . Prostate cancer Paternal Grandfather      GENETICS: 08/30/2014 BreastNext panel was negative. 17 genes including BRCA1, BRCA2, were negative for mutations.   ALLERGIES:  is allergic to adhesive [tape]; aspirin; and codeine.  MEDICATIONS:  Current Outpatient Prescriptions  Medication Sig Dispense Refill  . albuterol (PROVENTIL HFA;VENTOLIN HFA) 108 (90 Base) MCG/ACT inhaler Inhale 2 puffs into the lungs every 6 (six) hours as needed for wheezing or shortness of breath. (Patient not taking: Reported on 09/04/2016) 1 Inhaler 2  .  Calcium-Phosphorus-Vitamin D (CALCIUM GUMMIES PO) Take by mouth daily.    . Camphor-Eucalyptus-Menthol (VICKS VAPORUB EX) Apply 1 application topically at bedtime. Applies under nose, throat and on chest.    . cetirizine (ZYRTEC) 10 MG tablet Take 10 mg by mouth daily as needed for allergies (allergies).     . fexofenadine (ALLEGRA) 180 MG tablet Take 180 mg by mouth daily as needed.  4  . fluticasone (FLONASE) 50 MCG/ACT nasal spray   1  . hydrocortisone 2.5 % cream APPLY TOPICALLY TO THE AFFECTED AREA TWICE DAILY (Patient not taking: Reported on 09/04/2016) 454 g 0  . lidocaine-prilocaine (EMLA) cream APPLY TOPICALLY TO THE AFFECTED AREA AS NEEDED 30 g 0  . loperamide (IMODIUM A-D)  2 MG tablet Take 1 tablet (2 mg total) by mouth 4 (four) times daily as needed for diarrhea or loose stools. 30 tablet 0  . LORazepam (ATIVAN) 0.5 MG tablet Take 1 tablet (0.5 mg total) by mouth every 8 (eight) hours. (Patient not taking: Reported on 10/16/2016) 3 tablet 0  . metaxalone (SKELAXIN) 800 MG tablet Take 1 tablet (800 mg total) by mouth 3 (three) times daily. 30 tablet 0  . Multiple Vitamins-Minerals (AIRBORNE PO) Take 1 tablet by mouth daily.    . Multiple Vitamins-Minerals (MULTIVITAMIN GUMMIES ADULT PO) Take 1 each by mouth daily. Women's Vitafusion gummie    . nystatin (NYSTATIN) powder Apply topically 2 (two) times daily. 15 g 2  . triamcinolone (KENALOG) 0.025 % ointment Apply 1 application topically 2 (two) times daily. 30 g 1   No current facility-administered medications for this encounter.    Facility-Administered Medications Ordered in Other Encounters  Medication Dose Route Frequency Provider Last Rate Last Dose  . acetaminophen (TYLENOL) tablet 650 mg  650 mg Oral Once [name], MD      . sodium chloride 0.9 % injection 10 mL  10 mL Intracatheter PRN [name], MD   10 mL at 05/01/16 1622     PHYSICAL EXAMINATION: General:  Well appearing. No resp difficulty HEENT: normal Neck: supple. no  JVD. Carotids 2+ bilat; no bruits. No lymphadenopathy or thryomegaly appreciated. Cor: PMI nondisplaced. Regular rate & rhythm. No rubs, gallops or murmurs. Lungs: clear Abdomen: obese. soft, nontender, nondistended. No hepatosplenomegaly. No bruits or masses. Good bowel sounds. Extremities: no cyanosis, clubbing, rash, edema Neuro: alert & orientedx3, cranial nerves grossly intact. moves all 4 extremities w/o difficulty. Affect pleasant   LABORATORY DATA:  I have reviewed the data as listed CBC Latest Ref Rng & Units 11/27/2016 10/16/2016 09/04/2016  WBC 3.9 - 10.3 10e3/uL 7.0 6.6 5.7  Hemoglobin 11.6 - 15.9 g/dL 11.9 12.7 12.5  Hematocrit 34.8 - 46.6 % 35.6 38.0 37.2  Platelets 145 - 400 10e3/uL 222 221 220    CMP Latest Ref Rng & Units 11/27/2016 10/16/2016 09/04/2016  Glucose 70 - 140 mg/dl 148(H) 113 95  BUN 7.0 - 26.0 mg/dL 13.8 13.2 13.0  Creatinine 0.6 - 1.1 mg/dL 0.8 1.0 0.9  Sodium 136 - 145 mEq/L 143 141 140  Potassium 3.5 - 5.1 mEq/L 3.9 3.7 4.1  Chloride 101 - 111 mmol/L - - -  CO2 22 - 29 mEq/L 24 26 25   Calcium 8.4 - 10.4 mg/dL 9.5 9.4 9.8  Total Protein 6.4 - 8.3 g/dL 7.7 7.7 7.5  Total Bilirubin 0.20 - 1.20 mg/dL 0.37 0.50 0.38  Alkaline Phos 40 - 150 U/L 77 82 73  AST 5 - 34 U/L 25 20 19   ALT 0 - 55 U/L 32 30 26     ASSESSMENT & PLAN:   1. R breast invasive ductal carcinoma, T3N1M1, stage IV, ER-/PR-/HER2+, with metastases to b/l lungs, biopsy confirmed - I reviewed echos personally. EF and Doppler parameters stable. No HF on exam. Continue Herceptin.   2. HTN - BP elevated. Discussed need to get SBP < 140. She is reluctant to start anti-HTN meds at this point. She will try to be more active and get BP down. She will continue to monitor BP if systolic remain elevated for 2 months or SBP >160 she will call me to discuss therapy.   Glori Bickers MD 12/12/2016

## 2016-12-12 NOTE — Progress Notes (Signed)
  Echocardiogram 2D Echocardiogram has been performed.  [name] [name] 12/12/2016, 12:08 PM

## 2016-12-12 NOTE — Patient Instructions (Signed)
Your physician has requested that you regularly monitor and record your blood pressure readings at home. Please use the same machine at the same time of day to check your readings and record them to bring to your follow-up visit.  We will contact you in 6 months to schedule your next appointment and echocardiogram

## 2016-12-17 ENCOUNTER — Ambulatory Visit: Payer: 59 | Admitting: Hematology

## 2016-12-17 ENCOUNTER — Ambulatory Visit: Payer: 59

## 2016-12-17 ENCOUNTER — Other Ambulatory Visit: Payer: 59

## 2016-12-17 NOTE — Progress Notes (Signed)
[facility] Follow up note   Patient Care Team: System, Pcp Not In as PCP - [place], [place], DO (Family Medicine) Holley Bouche, NP as Nurse Practitioner (Nurse Practitioner) Stark Klein, MD as Consulting Physician (General Surgery) Arloa Koh, MD as Consulting Physician (Radiation Oncology) [name], MD as Consulting Physician (Hematology)  CHIEF COMPLAINTS Follow up metastatic breast cancer  Oncology History   Breast cancer 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   Staging form: Breast, AJCC 7th Edition     Clinical stage from 07/22/2014: Stage IV (T3, N1, M1) - Unsigned       Breast cancer 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 ([place])   07/02/2014 Mammogram    Mammogram showed a 2cm right beast mass and a 1.8cm right axillary node. MRI breast on 07/16/2014 showed 7cm R breast lesion and 4.4cm r axillary node       07/02/2014 Imaging    CT CAP: a 4.7cm mass in LUL lung and a 2.1cm mas in RML, and a small nodule in RUL, suspecious for metastasis        07/09/2014 Initial Diagnosis    right IDA with b/l lung lesions, ER-/PR-/HER2+      07/09/2014 Initial Biopsy    US guided right breast mass and axillary node biopsy showed IDA, and DCIS, ER-/PR-/HER2+      07/26/2014 Pathologic Stage    Left lung mass by IR, path revealed high grade carcinoma, morphology similar to breast tumor biopsy, TTF(-), NapsinA(-), ER(-)      08/04/2014 - 03/22/2015 Chemotherapy    weekly Paclitaxel 5m/m2, trastuzumab and pertuzumab every 3 weeks      10/04/2014 Imaging    Interval decrease in the right axillary lymphadenopathy. Bilateral pulmonary lesions with left hilar lymphadenopathy also markedly decreased in the interval. The left hilar lymphadenopathy has resolved.      12/20/2014 Imaging    restaging CT showed stable disease, no new lesions       03/29/2015 Pathology Results     right breast lumpectomy showed  chemotherapy treatment effect,  a 1 mm residual  tumor,   margins were widely negative, 5 sentinel lymph nodes and 2 axillary lymph nodes were negative.       03/29/2015 Surgery     right breast lumpectomy and sentinel lymph node biopsy  by Dr. BBarry Dienes     04/12/2015 -  Chemotherapy     Herceptin maintenance therapy , 6 mg/kg, every 3 weeks      05/03/2015 - 06/14/2015 Radiation Therapy    right breast adjuvant irradiation by Dr. MValere Dross      08/28/2016 Imaging    CT CAP w Contrast  IMPRESSION: 1. No acute process or evidence of metastatic disease within the chest, abdomen, or pelvis. 2. Similar to less well-defined left upper lobe density, likely scarring. No evidence of new or progressive pulmonary metastasis. 3. Right nephrolithiasis.       CURRENT THERAPY: Herceptin maintenance therapy every 3 weeks, started on 04/12/2015  INTERVAL HISTORY:   [place] follow[place]up and Herceptin treatment. She presents to the clinic today feeling hungry but good. She reports nothing new since last time. Due to allergies her sinus are congested. She uses Zyrtec and benadryl.  She currently has a low grade fever. She has been having a spike in her Blood pressure. She reports they will take it again in the infusion room. She reports she has a cruise on the 19th of June. She denies any breast pain. When  she does not drink a lot of water she has a flare up of kidney stones   MEDICAL HISTORY:  Past Medical History:  Diagnosis Date  . Breast cancer ([place])   . Breast cancer ([place])   . GERD (gastroesophageal reflux disease)    during pregnancy   . Pneumonia    hx of pneumonia 08/2013   . S/P radiation therapy 05/03/2015 through 06/14/2015    Right breast 4680 cGy in 26 sessions, right breast boost 1000 cGy in 5 sessions. Right supraclavicular/axillary region 4680 cGy with a supplemental PA field to bring the axillary dose up to 4500 cGy in 26 sessions    SURGICAL HISTORY: Past Surgical  History:  Procedure Laterality Date  . BREAST LUMPECTOMY WITH RADIOACTIVE SEED AND SENTINEL LYMPH NODE BIOPSY Right 03/29/2015   Procedure: BREAST LUMPECTOMY WITH RADIOACTIVE SEED AND SENTINEL LYMPH NODE BIOPSY;  Surgeon: Stark Klein, MD;  Location: [facility];  Service: General;  Laterality: Right;  . CESAREAN SECTION    . CHOLECYSTECTOMY    . ESSURE TUBAL LIGATION    . PORTACATH PLACEMENT Left 07/21/2014   Procedure: INSERTION PORT-A-CATH;  Surgeon: Stark Klein, MD;  Location: [facility];  Service: General;  Laterality: Left;  . WISDOM TOOTH EXTRACTION      SOCIAL HISTORY: History   Social History  . Marital Status: Married    Spouse Name: N/A    Number of Children:  she has 2 daughters at the age of 55 and 57.   . Years of Education: N/A   Occupational History  .  works as Freight forwarder for a [employer].    Social History Main Topics  . Smoking status: Never Smoker   . Smokeless tobacco: Not on file  . Alcohol Use: Yes  . Drug Use: No  . Sexual Activity: No    FAMILY HISTORY: Family History  Problem Relation Age of Onset  . Breast cancer Mother 39  . Liver cancer Father 52  . Breast cancer Maternal Aunt 36  . Prostate cancer Maternal Grandfather   . Liver cancer Paternal Grandmother   . Prostate cancer Paternal Grandfather      GENETICS: 08/30/2014 BreastNext panel was negative. 17 genes including BRCA1, BRCA2, were negative for mutations.   ALLERGIES:  is allergic to adhesive [tape]; aspirin; and codeine.  MEDICATIONS:  Current Outpatient Prescriptions  Medication Sig Dispense Refill  . albuterol (PROVENTIL HFA;VENTOLIN HFA) 108 (90 Base) MCG/ACT inhaler Inhale 2 puffs into the lungs every 6 (six) hours as needed for wheezing or shortness of breath. (Patient not taking: Reported on 09/04/2016) 1 Inhaler 2  . Calcium-Phosphorus-Vitamin D (CALCIUM GUMMIES PO) Take by mouth daily.    . Camphor-Eucalyptus-Menthol (VICKS VAPORUB EX) Apply 1 application  topically at bedtime. Applies under nose, throat and on chest.    . cetirizine (ZYRTEC) 10 MG tablet Take 10 mg by mouth daily as needed for allergies (allergies).     . fexofenadine (ALLEGRA) 180 MG tablet Take 180 mg by mouth daily as needed.  4  . fluticasone (FLONASE) 50 MCG/ACT nasal spray   1  . hydrocortisone 2.5 % cream APPLY TOPICALLY TO THE AFFECTED AREA TWICE DAILY (Patient not taking: Reported on 09/04/2016) 454 g 0  . lidocaine-prilocaine (EMLA) cream APPLY TOPICALLY TO THE AFFECTED AREA AS NEEDED 30 g 0  . loperamide (IMODIUM A-D) 2 MG tablet Take 1 tablet (2 mg total) by mouth 4 (four) times daily as needed for diarrhea or loose stools. 30 tablet  0  . LORazepam (ATIVAN) 0.5 MG tablet Take 1 tablet (0.5 mg total) by mouth every 8 (eight) hours. (Patient not taking: Reported on 10/16/2016) 3 tablet 0  . metaxalone (SKELAXIN) 800 MG tablet Take 1 tablet (800 mg total) by mouth 3 (three) times daily. 30 tablet 0  . Multiple Vitamins-Minerals (AIRBORNE PO) Take 1 tablet by mouth daily.    . Multiple Vitamins-Minerals (MULTIVITAMIN GUMMIES ADULT PO) Take 1 each by mouth daily. Women's Vitafusion gummie    . nystatin (NYSTATIN) powder Apply topically 2 (two) times daily. 15 g 2  . triamcinolone (KENALOG) 0.025 % ointment Apply 1 application topically 2 (two) times daily. 30 g 1   No current facility-administered medications for this visit.    Facility-Administered Medications Ordered in Other Visits  Medication Dose Route Frequency Provider Last Rate Last Dose  . acetaminophen (TYLENOL) tablet 650 mg  650 mg Oral Once [name], MD      . sodium chloride 0.9 % injection 10 mL  10 mL Intracatheter PRN [name], MD   10 mL at 05/01/16 1622    REVIEW OF SYSTEMS:   Constitutional: Denies fevers, chills or abnormal night sweats (+) Low grade fever  Eyes: Denies blurriness of vision, double vision or watery eyes Ears, nose, mouth, throat, and face: Denies mucositis or sore throat (+) Sinus  congestion Respiratory: Occasional dry cough, no dyspnea or wheezes Cardiovascular: Denies palpitation, chest discomfort or lower extremity swelling Gastrointestinal:  Denies nausea, heartburn or change in bowel habits Skin: Negative Lymphatics: Denies new lymphadenopathy or easy bruising Neurological:Denies numbness, new weaknesses (+) tingling in fingers Behavioral/Psych: Mood is stable, no new changes  All other systems were reviewed with the patient and are negative.  PHYSICAL EXAMINATION: ECOG PERFORMANCE STATUS: 0  Vitals:   12/19/16 0931  BP: (!) 161/82  Pulse: 67  Resp: 18  Temp: 99 F (37.2 C)  TempSrc: Oral  SpO2: 100%  Weight: 239 lb 11.2 oz (108.7 kg)  Height: 5' 10" (1.778 m)     GENERAL:alert, no distress and comfortable SKIN: skin color, texture, turgor are normal, no rashes or significant lesions EYES: normal, conjunctiva are pink and non-injected, sclera clear OROPHARYNX:no exudate, no erythema and lips, buccal mucosa, and tongue normal  NECK: supple, thyroid normal size, non-tender, without nodularity LYMPH:  no palpable lymphadenopathy in the cervical, axillary or inguinal LUNGS: clear to auscultation and percussion with normal breathing effort HEART: regular rate & rhythm and no murmurs and no lower extremity edema ABDOMEN:abdomen soft, non-tender and normal bowel sounds Musculoskeletal:no cyanosis of digits and no clubbing, no tenderness  PSYCH: alert & oriented x 3 with fluent speech NEURO: no focal motor/sensory deficits Breasts:  Status post right breast lumpectomy and sentinel lymph node biopsy, incisions have healed well. The previous palpable seroma has resolved. (+) mild skin pigmentation of right breast secondary to radiation. Palpation of both breasts  and axillas revealed no other obvious mass that I could appreciate.  Skin: No skin rashes.   LABORATORY DATA:  I have reviewed the data as listed CBC Latest Ref Rng & Units 12/19/2016 11/27/2016  10/16/2016  WBC 3.9 - 10.3 10e3/uL 5.6 7.0 6.6  Hemoglobin 11.6 - 15.9 g/dL 12.3 11.9 12.7  Hematocrit 34.8 - 46.6 % 36.2 35.6 38.0  Platelets 145 - 400 10e3/uL 231 222 221    CMP Latest Ref Rng & Units 12/19/2016 11/27/2016 10/16/2016  Glucose 70 - 140 mg/dl 80 148(H) 113  BUN 7.0 - 26.0 mg/dL 11.8 13.8  13.2  Creatinine 0.6 - 1.1 mg/dL 0.8 0.8 1.0  Sodium 136 - 145 mEq/L 140 143 141  Potassium 3.5 - 5.1 mEq/L 4.0 3.9 3.7  Chloride 101 - 111 mmol/L - - -  CO2 22 - 29 mEq/L _0 Calcium 8.4 - 10.4 mg/dL 9.2 9.5 9.4  Total Protein 6.4 - 8.3 g/dL 7.4 7.7 7.7  Total Bilirubin 0.20 - 1.20 mg/dL 0.32 0.37 0.50  Alkaline Phos 40 - 150 U/L 64 77 82  AST 5 - 34 U/L _1 ALT 0 - 55 U/L 26 32 30    PATHOLOGY REPORT 07/09/2014 #1 breast, right needle core biopsy more anterior -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 45)  Lung, biopsy, Left 07/26/2014 - HIGH GRADE CARCINOMA, SEE COMMENT. Microscopic Comment The carcinoma demonstrates the following immunophenotype: TTF-1 - negative expression. Napsin A- negative expression. CK5/6 - focal, moderate expression. estrogen receptor - negative expression. GCDFP- negative expression. The recent breast biopsy demonstrating Her2 amplified high grade invasive mammary carcinoma is noted ([id number]). Although the immunophenotype of the current case is non-sepcific, it strongly argues against primary lung adenocarcinoma. However, on re-review, the morphology of the current case is essentially identical to the primary mammary carcinoma. In lieu of further immunophenotyping, the tumor will be submitted for Her2 testing and the remaining tissue will be reserved for additional ancillary tumor testing. The results of the Her2  testing will be reported in an addendum. The case was discussed with Dr Burr Medico on 07/28/2015 and 07/29/2015 HER2 POSITIVE   Diagnosis 03/29/2015 1. Breast, lumpectomy, Right - INVASIVE DUCTAL CARCINOMA, SEE COMMENT. - SEE TUMOR SYNOPTIC TEMPLATE BELOW. 2. Lymph node, sentinel, biopsy, Right axillary #1 - ONE LYMPH NODE, NEGATIVE FOR TUMOR (0/1). - SEE COMMENT. 3. Lymph node, sentinel, biopsy, Right axillary #2 - BENIGN FIBROFATTY SOFT TISSUE. - NEGATIVE FOR ATYPIA OR MALIGNANCY. - NEGATIVE FOR LYMPH NODE. 4. Lymph node, sentinel, biopsy, Right axillary #3 - ONE LYMPH NODE, NEGATIVE FOR TUMOR (0/1). - SEE COMMENT. 5. Lymph node, sentinel, biopsy, Right axillary #4 - ONE LYMPH NODE, NEGATIVE FOR TUMOR (0/1). - SEE COMMENT. 6. Lymph node, sentinel, biopsy, Right axillary #5 - ONE LYMPH NODE, NEGATIVE FOR TUMOR (0/1). - SEE COMMENT. 7. Lymph node, sentinel, biopsy, right axillary - ONE LYMPH NODE, NEGATIVE FOR TUMOR (0/1). - SEE COMMENT. 8. Lymph node, sentinel, biopsy, right axillary - ONE LYMPH NODE, NEGATIVE FOR TUMOR (0/1). - SEE COMMENT. 1 of 4 Amended copy Amended FINAL for HIDAYA, [name] [name] ([id number]) Microscopic Comment 1. BREAST, INVASIVE TUMOR, WITH LYMPH NODES PRESENT Specimen, including laterality and lymph node sampling (sentinel, non-sentinel): Right breast with sentinel and non-sentinel lymph node sampling Procedure: Lumpectomy Histologic type: Ductal, see comment Grade: 2 of 3, see comment. Tubule formation: 3 Nuclear pleomorphism: 3 Mitotic: 1 Tumor size (glass slide measurement): 1 mm, see comment Margins: Invasive, distance to closest margin: See comment In-situ, distance to closest margin: N/A If margin positive, focally or broadly: N/A Lymphovascular invasion: Absent Ductal carcinoma in situ: Absent Grade: N/A Extensive intraductal component: N/A Lobular neoplasia: Absent Tumor focality: Unifocal, see comment Treatment effect: Present If  present, treatment effect in breast tissue, lymph nodes or both: Both breast tissue and lymph nodes Extent of tumor: Skin: N/A Nipple: N/A Skeletal muscle: N/A Lymph nodes: Examined: 5 Sentinel  2 Non-sentinel 7 Total Lymph nodes with metastasis: 0 Isolated tumor cells (< 0.2 mm): N/A Micrometastasis: (> 0.2 mm and < 2.0 mm): N/A Macrometastasis: (> 2.0 mm): N/A Extracapsular extension: N/A Breast prognostic profile: Estrogen receptor: Not repeated, previous study demonstrated 0% positivity ([id number]). Progesterone receptor: Not repeated, previous study demonstrated 0% positivity ([id number]) Her 2 neu: Demonstrates amplificaition ([id number]) Ki-67: Not repeated, previous study demonstrate 79% proliferation rate ([id number]). Non-neoplastic breast: Neoadjuvant related tissue changes and adenosis. TNM: ypT5m, ypN0, pMX Comments: Numerous representative sections including the 2.0 cm hemorrhagic tissue associated with X-shaped biopsy clip, the 2.0 cm ill defined lesion associated with M-shaped clip, and intervening tissue were submitted for review. Slide sections from the tissue surrounding the X-shaped and M-shaped clips demonstrate tissue changes consistent with neoadjuvant related change. There is no invasive or in situ carcinom present at either site. However, in a representative section from the intervening tissue (slide G), there is a 1 mm focus of high grade invasive ductal carcinoma present. Given the minute size of the tumor, tumor grading is limited. The tumor is present within non-marginal tissue sections and is considered to be at least 0.5 cm from the nearest margin (medial).   Note: Amendment issued for modification of synoptic table, inadvertent typographical error. The change in the synoptic table was discussed with Dr FBurr Medicoon 04/07/2015. (CRR:ecj 04/07/2015) 2. , 4-8. In parts 2 and 4, there is extensive neoadjuvant related tissue changes including abundant  foamy macrophages, fibroinflammatory reaction and dystrophic calcifications. The neoadjuvant tissue changes extend into perinodal soft tissue and are either focally or broadly present at the cauterized edge of the tissue submitted. There are no definitive features of malignancy present.  RADIOGRAPHIC STUDIES:  CT CAP w contrast 08/28/2016 IMPRESSION: 1. No acute process or evidence of metastatic disease within the chest, abdomen, or pelvis. 2. Similar to less well-defined left upper lobe density, likely scarring. No evidence of new or progressive pulmonary metastasis. 3. Right nephrolithiasis.  Bone scan 02/23/2016 IMPRESSION: No definite evidence of osseous metastatic disease.  Subtle increased uptake in the posterior lateral aspect of the right eighth rib merits further evaluation with a right rib x-ray series.  CT head w wo contrast 07/03/2016 IMPRESSION: No CT findings of intracranial metastasis though, MRI of the brain with contrast would be more sensitive for subtle abnormalities.  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  ECHO 12/12/16 Study Conclusions  - Left ventricle: The cavity size was normal. Wall thickness was   normal. Systolic function was normal. The estimated ejection   fraction was in the range of 55% to 60%. Wall motion was normal;   there were no regional wall motion abnormalities. Doppler   parameters are consistent with abnormal left ventricular   relaxation (grade 1 diastolic dysfunction). Impressions: - Normal LV systolic function; mild diastolic dysfunction; trace MR   and TR; global longitudinal strain -19.7%.   ASSESSMENT & PLAN:  46y.o.  African-American female, premenopausal, without significant past medical history, presented with palpable right breast mass and right axillary  mass.   1. 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 - I previously reviewed her breasts surgical pathology results with her in great detail ,  She has had fantastic partial response (near complete response) form 8 months of chemotherapy and antibody therapy.   The initial 6.3 cm breast mass has only 1 mm residual tumor,  And the previously 4.4 cm right axillary lymph node has had complete  Pathological response, all 7 nodes were negative for cancer. -I previously reviewed her restaging CT scan from 08/28/2016, which showed no evidence of disease, the scar tissue in left upper lobe lung is unchanged. No suspicious bone lesions on the CT scan. She is NED  -Continue monitoring cardiac echo every 6 months, she will follow up with Dr. Haroldine Laws  -lab previously reviewed, adequate for treatment, we'll continue Herceptin every 3 weeks -She previously had her mammogram done in December 2017. The report is not in [facility], but I will request this from [place].  -she is clinically doing very well, lab and exam are unremarkable, no clinical suspicion for recurrence  -repeat CT scan in mid June  -Labs reviewed and adequate to continue infusion today -Due to patient's tight schedule I will follow up with her in [place] on 6/13 or 6/14  2. Chronic sinusitis  -She has previously had recurrent sinusitis, and bronchitis. Her symptom has resolved lately. -Her symptoms have returned due to allergy season   3. Mild peripheral neuropathy, G1 -Probably related to her prior chemotherapy -previously very mild, we'll continue monitoring.   4. Right sided chest pain -She has previously been having intermittent right-sided posterior low chest pain, radiates to her lower back. -She has some abnormal  uptake on her bone scan in July 2017, possible old fracture. -She has a kidney stone, which has been present since 2015, but I doubt her chest pains related to her case to him. -Her previous pain has near resolved lately   5. HTN? -No prior history of HTN, but her BP has been slightly elevated lately  -She does not have a PCP, but she will find one to monitor this. -I encourage a healthy low salt diet and exercise. She agrees and is going to start walking after work, I also strongly encouraged her to lose weight. She does not want to take medication, and is motivated to follow diet and exercise  -I again encouraged her to check her BP at home or at a pharmacy. -she will follow up with Dr. Haroldine Laws    6. Obesity  -I previously encouraged her to have healthy diet, exercise regularly, and try to lose some weight.  Plan  -CT CAP with contrast 6/11 -f/u at 4pm 6/13 or 6/14 -Herceptin today, 5/30 (33m/kg) and and 6/15 with labs (she will be on vacation the week of 6/20)    I spent about 20 minutes counseling the patient and her family members, total care was about 25 minutes.  This document serves as a record of services personally performed by YTruitt Merle MD. It was created on her behalf by AJoslyn Devon a trained medical scribe. The creation of this record is based on the scribe's personal observations and the provider's statements to them. This document has been checked and approved by the attending provider.   I have reviewed the above documentation for accuracy and completeness and I agree with the above.  FTruitt Merle 12/19/2016

## 2016-12-18 ENCOUNTER — Other Ambulatory Visit: Payer: Self-pay

## 2016-12-18 DIAGNOSIS — C50911 Malignant neoplasm of unspecified site of right female breast: Secondary | ICD-10-CM

## 2016-12-18 DIAGNOSIS — C7800 Secondary malignant neoplasm of unspecified lung: Principal | ICD-10-CM

## 2016-12-19 ENCOUNTER — Other Ambulatory Visit (HOSPITAL_BASED_OUTPATIENT_CLINIC_OR_DEPARTMENT_OTHER): Payer: 59

## 2016-12-19 ENCOUNTER — Ambulatory Visit (HOSPITAL_BASED_OUTPATIENT_CLINIC_OR_DEPARTMENT_OTHER): Payer: 59

## 2016-12-19 ENCOUNTER — Ambulatory Visit (HOSPITAL_BASED_OUTPATIENT_CLINIC_OR_DEPARTMENT_OTHER): Payer: 59 | Admitting: Hematology

## 2016-12-19 ENCOUNTER — Telehealth: Payer: Self-pay | Admitting: Hematology

## 2016-12-19 ENCOUNTER — Encounter: Payer: Self-pay | Admitting: Hematology

## 2016-12-19 ENCOUNTER — Ambulatory Visit: Payer: 59

## 2016-12-19 VITALS — BP 154/89 | HR 70

## 2016-12-19 VITALS — BP 161/82 | HR 67 | Temp 99.0°F | Resp 18 | Ht 70.0 in | Wt 239.7 lb

## 2016-12-19 DIAGNOSIS — Z5112 Encounter for antineoplastic immunotherapy: Secondary | ICD-10-CM

## 2016-12-19 DIAGNOSIS — C773 Secondary and unspecified malignant neoplasm of axilla and upper limb lymph nodes: Secondary | ICD-10-CM

## 2016-12-19 DIAGNOSIS — G629 Polyneuropathy, unspecified: Secondary | ICD-10-CM

## 2016-12-19 DIAGNOSIS — C7800 Secondary malignant neoplasm of unspecified lung: Principal | ICD-10-CM

## 2016-12-19 DIAGNOSIS — C50911 Malignant neoplasm of unspecified site of right female breast: Secondary | ICD-10-CM

## 2016-12-19 DIAGNOSIS — R0789 Other chest pain: Secondary | ICD-10-CM

## 2016-12-19 DIAGNOSIS — C7802 Secondary malignant neoplasm of left lung: Secondary | ICD-10-CM

## 2016-12-19 DIAGNOSIS — Z95828 Presence of other vascular implants and grafts: Secondary | ICD-10-CM

## 2016-12-19 DIAGNOSIS — Z171 Estrogen receptor negative status [ER-]: Secondary | ICD-10-CM

## 2016-12-19 DIAGNOSIS — R030 Elevated blood-pressure reading, without diagnosis of hypertension: Secondary | ICD-10-CM | POA: Diagnosis not present

## 2016-12-19 LAB — COMPREHENSIVE METABOLIC PANEL
ALT: 26 U/L (ref 0–55)
ANION GAP: 7 meq/L (ref 3–11)
AST: 21 U/L (ref 5–34)
Albumin: 3.5 g/dL (ref 3.5–5.0)
Alkaline Phosphatase: 64 U/L (ref 40–150)
BUN: 11.8 mg/dL (ref 7.0–26.0)
CHLORIDE: 107 meq/L (ref 98–109)
CO2: 25 meq/L (ref 22–29)
Calcium: 9.2 mg/dL (ref 8.4–10.4)
Creatinine: 0.8 mg/dL (ref 0.6–1.1)
Glucose: 80 mg/dl (ref 70–140)
POTASSIUM: 4 meq/L (ref 3.5–5.1)
Sodium: 140 mEq/L (ref 136–145)
Total Bilirubin: 0.32 mg/dL (ref 0.20–1.20)
Total Protein: 7.4 g/dL (ref 6.4–8.3)

## 2016-12-19 LAB — CBC WITH DIFFERENTIAL/PLATELET
BASO%: 0.4 % (ref 0.0–2.0)
Basophils Absolute: 0 10*3/uL (ref 0.0–0.1)
EOS%: 1.7 % (ref 0.0–7.0)
Eosinophils Absolute: 0.1 10*3/uL (ref 0.0–0.5)
HCT: 36.2 % (ref 34.8–46.6)
HGB: 12.3 g/dL (ref 11.6–15.9)
LYMPH%: 30.4 % (ref 14.0–49.7)
MCH: 30 pg (ref 25.1–34.0)
MCHC: 34 g/dL (ref 31.5–36.0)
MCV: 88.5 fL (ref 79.5–101.0)
MONO#: 0.5 10*3/uL (ref 0.1–0.9)
MONO%: 8.5 % (ref 0.0–14.0)
NEUT#: 3.3 10*3/uL (ref 1.5–6.5)
NEUT%: 59 % (ref 38.4–76.8)
PLATELETS: 231 10*3/uL (ref 145–400)
RBC: 4.08 10*6/uL (ref 3.70–5.45)
RDW: 14.3 % (ref 11.2–14.5)
WBC: 5.6 10*3/uL (ref 3.9–10.3)
lymph#: 1.7 10*3/uL (ref 0.9–3.3)

## 2016-12-19 MED ORDER — ACETAMINOPHEN 325 MG PO TABS: 650.0000 mg | ORAL_TABLET | Freq: Once | ORAL | Status: DC

## 2016-12-19 MED ORDER — ACETAMINOPHEN 325 MG PO TABS
ORAL_TABLET | ORAL | Status: AC
  Filled 2016-12-19: qty 2

## 2016-12-19 MED ORDER — HEPARIN SOD (PORK) LOCK FLUSH 100 UNIT/ML IV SOLN
500.0000 [IU] | Freq: Once | INTRAVENOUS | Status: AC | PRN
  Administered 2016-12-19: 500 [IU]
  Filled 2016-12-19: qty 5

## 2016-12-19 MED ORDER — TRASTUZUMAB CHEMO 150 MG IV SOLR
6.0000 mg/kg | Freq: Once | INTRAVENOUS | Status: AC
  Administered 2016-12-19: 609 mg via INTRAVENOUS
  Filled 2016-12-19: qty 29

## 2016-12-19 MED ORDER — SODIUM CHLORIDE 0.9 % IJ SOLN
10.0000 mL | INTRAMUSCULAR | Status: DC | PRN
  Administered 2016-12-19: 10 mL
  Filled 2016-12-19: qty 10

## 2016-12-19 MED ORDER — SODIUM CHLORIDE 0.9 % IV SOLN
Freq: Once | INTRAVENOUS | Status: AC
  Administered 2016-12-19: 11:00:00 via INTRAVENOUS

## 2016-12-19 MED ORDER — SODIUM CHLORIDE 0.9 % IJ SOLN
10.0000 mL | INTRAMUSCULAR | Status: DC | PRN
  Administered 2016-12-19: 10 mL via INTRAVENOUS
  Filled 2016-12-19: qty 10

## 2016-12-19 NOTE — Patient Instructions (Signed)

## 2016-12-19 NOTE — Patient Instructions (Signed)
[facility] Discharge Instructions for Patients Receiving Chemotherapy  Today you received the following chemotherapy agents: Heceptin   To help prevent nausea and vomiting after your treatment, we encourage you to take your nausea medication as directed.    If you develop nausea and vomiting that is not controlled by your nausea medication, call the clinic.   BELOW ARE SYMPTOMS THAT SHOULD BE REPORTED IMMEDIATELY:  *FEVER GREATER THAN 100.5 F  *CHILLS WITH OR WITHOUT FEVER  NAUSEA AND VOMITING THAT IS NOT CONTROLLED WITH YOUR NAUSEA MEDICATION  *UNUSUAL SHORTNESS OF BREATH  *UNUSUAL BRUISING OR BLEEDING  TENDERNESS IN MOUTH AND THROAT WITH OR WITHOUT PRESENCE OF ULCERS  *URINARY PROBLEMS  *BOWEL PROBLEMS  UNUSUAL RASH Items with * indicate a potential emergency and should be followed up as soon as possible.  Feel free to call the clinic you have any questions or concerns. The clinic phone number is [phone].  Please show the CHEMO ALERT CARD at check-in to the Emergency Department and triage nurse.   

## 2016-12-19 NOTE — Telephone Encounter (Signed)
Appointments scheduled per 5.9.18 LOS. Patient given AVS report and calendars with future scheduled appointments.

## 2017-01-08 ENCOUNTER — Ambulatory Visit (HOSPITAL_BASED_OUTPATIENT_CLINIC_OR_DEPARTMENT_OTHER): Payer: 59

## 2017-01-08 VITALS — BP 152/73 | HR 73 | Temp 98.7°F | Resp 18

## 2017-01-08 DIAGNOSIS — C50911 Malignant neoplasm of unspecified site of right female breast: Secondary | ICD-10-CM

## 2017-01-08 DIAGNOSIS — C773 Secondary and unspecified malignant neoplasm of axilla and upper limb lymph nodes: Secondary | ICD-10-CM

## 2017-01-08 DIAGNOSIS — Z5112 Encounter for antineoplastic immunotherapy: Secondary | ICD-10-CM

## 2017-01-08 DIAGNOSIS — C7800 Secondary malignant neoplasm of unspecified lung: Principal | ICD-10-CM

## 2017-01-08 MED ORDER — HEPARIN SOD (PORK) LOCK FLUSH 100 UNIT/ML IV SOLN
500.0000 [IU] | Freq: Once | INTRAVENOUS | Status: AC | PRN
  Administered 2017-01-08: 500 [IU]
  Filled 2017-01-08: qty 5

## 2017-01-08 MED ORDER — ACETAMINOPHEN 325 MG PO TABS: 650.0000 mg | ORAL_TABLET | Freq: Once | ORAL | Status: DC

## 2017-01-08 MED ORDER — SODIUM CHLORIDE 0.9 % IJ SOLN
10.0000 mL | INTRAMUSCULAR | Status: DC | PRN
  Administered 2017-01-08: 10 mL
  Filled 2017-01-08: qty 10

## 2017-01-08 MED ORDER — TRASTUZUMAB CHEMO 150 MG IV SOLR
4.0000 mg/kg | Freq: Once | INTRAVENOUS | Status: AC
  Administered 2017-01-08: 420 mg via INTRAVENOUS
  Filled 2017-01-08: qty 20

## 2017-01-08 MED ORDER — SODIUM CHLORIDE 0.9 % IV SOLN
Freq: Once | INTRAVENOUS | Status: AC
  Administered 2017-01-08: 16:00:00 via INTRAVENOUS

## 2017-01-08 NOTE — Patient Instructions (Signed)
Trastuzumab injection for infusion What is this medicine? TRASTUZUMAB (tras TOO zoo mab) is a monoclonal antibody. It is used to treat breast cancer and stomach cancer. This medicine may be used for other purposes; ask your health care provider or pharmacist if you have questions. COMMON BRAND NAME(S): Herceptin What should I tell my health care provider before I take this medicine? They need to know if you have any of these conditions: -heart disease -heart failure -lung or breathing disease, like asthma -an unusual or allergic reaction to trastuzumab, benzyl alcohol, or other medications, foods, dyes, or preservatives -pregnant or trying to get pregnant -breast-feeding How should I use this medicine? This drug is given as an infusion into a vein. It is administered in a hospital or clinic by a specially trained health care professional. Talk to your pediatrician regarding the use of this medicine in children. This medicine is not approved for use in children. Overdosage: If you think you have taken too much of this medicine contact a poison control center or emergency room at once. NOTE: This medicine is only for you. Do not share this medicine with others. What if I miss a dose? It is important not to miss a dose. Call your doctor or health care professional if you are unable to keep an appointment. What may interact with this medicine? This medicine may interact with the following medications: -certain types of chemotherapy, such as daunorubicin, doxorubicin, epirubicin, and idarubicin This list may not describe all possible interactions. Give your health care provider a list of all the medicines, herbs, non-prescription drugs, or dietary supplements you use. Also tell them if you smoke, drink alcohol, or use illegal drugs. Some items may interact with your medicine. What should I watch for while using this medicine? Visit your doctor for checks on your progress. Report any side effects.  Continue your course of treatment even though you feel ill unless your doctor tells you to stop. Call your doctor or health care professional for advice if you get a fever, chills or sore throat, or other symptoms of a cold or flu. Do not treat yourself. Try to avoid being around people who are sick. You may experience fever, chills and shaking during your first infusion. These effects are usually mild and can be treated with other medicines. Report any side effects during the infusion to your health care professional. Fever and chills usually do not happen with later infusions. Do not become pregnant while taking this medicine or for 7 months after stopping it. Women should inform their doctor if they wish to become pregnant or think they might be pregnant. Women of child-bearing potential will need to have a negative pregnancy test before starting this medicine. There is a potential for serious side effects to an unborn child. Talk to your health care professional or pharmacist for more information. Do not breast-feed an infant while taking this medicine or for 7 months after stopping it. Women must use effective birth control with this medicine. What side effects may I notice from receiving this medicine? Side effects that you should report to your doctor or health care professional as soon as possible: -allergic reactions like skin rash, itching or hives, swelling of the face, lips, or tongue -chest pain or palpitations -cough -dizziness -feeling faint or lightheaded, falls -fever -general ill feeling or flu-like symptoms -signs of worsening heart failure like breathing problems; swelling in your legs and feet -unusually weak or tired Side effects that usually do not require medical   attention (report to your doctor or health care professional if they continue or are bothersome): -bone pain -changes in taste -diarrhea -joint pain -nausea/vomiting -weight loss This list may not describe all  possible side effects. Call your doctor for medical advice about side effects. You may report side effects to FDA at 1-800-FDA-1088. Where should I keep my medicine? This drug is given in a hospital or clinic and will not be stored at home. NOTE: This sheet is a summary. It may not cover all possible information. If you have questions about this medicine, talk to your doctor, pharmacist, or health care provider.  2018 [employer] (2016-07-24 14:37:52)  

## 2017-01-14 NOTE — Progress Notes (Signed)
[facility] Follow up note   Patient Care Team: System, Pcp Not In as PCP - Gladstone, Kingfisher, DO (Family Medicine) Holley Bouche, NP as Nurse Practitioner (Nurse Practitioner) Stark Klein, MD as Consulting Physician (General Surgery) Arloa Koh, MD as Consulting Physician (Radiation Oncology) [name], MD as Consulting Physician (Hematology)  CHIEF COMPLAINTS Follow up metastatic breast cancer  Oncology History   Breast cancer 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   Staging form: Breast, AJCC 7th Edition     Clinical stage from 07/22/2014: Stage IV (T3, N1, M1) - Unsigned       Breast cancer 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 ([place])   07/02/2014 Mammogram    Mammogram showed a 2cm right beast mass and a 1.8cm right axillary node. MRI breast on 07/16/2014 showed 7cm R breast lesion and 4.4cm r axillary node       07/02/2014 Imaging    CT CAP: a 4.7cm mass in LUL lung and a 2.1cm mas in RML, and a small nodule in RUL, suspecious for metastasis        07/09/2014 Initial Diagnosis    right IDA with b/l lung lesions, ER-/PR-/HER2+      07/09/2014 Initial Biopsy    US guided right breast mass and axillary node biopsy showed IDA, and DCIS, ER-/PR-/HER2+      07/26/2014 Pathologic Stage    Left lung mass by IR, path revealed high grade carcinoma, morphology similar to breast tumor biopsy, TTF(-), NapsinA(-), ER(-)      08/04/2014 - 03/22/2015 Chemotherapy    weekly Paclitaxel 54m/m2, trastuzumab and pertuzumab every 3 weeks      08/30/2014 Genetic Testing    BreastNext panel was negative. 17 genes including BRCA1, BRCA2, were negative for mutations.       10/04/2014 Imaging    Interval decrease in the right axillary lymphadenopathy. Bilateral pulmonary lesions with left hilar lymphadenopathy also markedly decreased in the interval. The left hilar lymphadenopathy has resolved.      12/20/2014 Imaging    restaging CT showed stable disease, no new  lesions       03/29/2015 Pathology Results     right breast lumpectomy showed  chemotherapy treatment effect,  a 1 mm residual tumor,   margins were widely negative, 5 sentinel lymph nodes and 2 axillary lymph nodes were negative.       03/29/2015 Surgery     right breast lumpectomy and sentinel lymph node biopsy  by Dr. BBarry Dienes     04/12/2015 -  Chemotherapy     Herceptin maintenance therapy , 6 mg/kg, every 3 weeks      05/03/2015 - 06/14/2015 Radiation Therapy    right breast adjuvant irradiation by Dr. MValere Dross      08/28/2016 Imaging    CT CAP w Contrast  IMPRESSION: 1. No acute process or evidence of metastatic disease within the chest, abdomen, or pelvis. 2. Similar to less well-defined left upper lobe density, likely scarring. No evidence of new or progressive pulmonary metastasis. 3. Right nephrolithiasis.      01/22/2017 Imaging    CT Chest W Contrast 01/22/17 IMPRESSION: 1. Stable exam. No new or progressive findings. No evidence for metastatic disease. 2. Left upper lobe architectural distortion/scarring is stable. 3. Nonobstructing right renal stone.       CURRENT THERAPY: Herceptin maintenance therapy every 3 weeks, started on 04/12/2015  INTERVAL HISTORY:   NRemoniareturns for follow-up. She presents to the clinic today reporting that she feels good  and nothing new. She had gotten nauseous before her scan. She thinks it is due to her not eating. Since January, After her Herceptin treatments, she feels flu symptoms like aching and fatigue. She feels better the following the next day. Tylenol did not help her so she is not taking it.  She had 1 episode of a rash around her port form being covered by her adhesive bandage, now resolved She would like a refill of her EMLA creme.    MEDICAL HISTORY:  Past Medical History:  Diagnosis Date  . Breast cancer ([place])   . Breast cancer ([place])   . GERD (gastroesophageal reflux disease)    during pregnancy   . Pneumonia     hx of pneumonia 08/2013   . S/P radiation therapy 05/03/2015 through 06/14/2015    Right breast 4680 cGy in 26 sessions, right breast boost 1000 cGy in 5 sessions. Right supraclavicular/axillary region 4680 cGy with a supplemental PA field to bring the axillary dose up to 4500 cGy in 26 sessions    SURGICAL HISTORY: Past Surgical History:  Procedure Laterality Date  . BREAST LUMPECTOMY WITH RADIOACTIVE SEED AND SENTINEL LYMPH NODE BIOPSY Right 03/29/2015   Procedure: BREAST LUMPECTOMY WITH RADIOACTIVE SEED AND SENTINEL LYMPH NODE BIOPSY;  Surgeon: Stark Klein, MD;  Location: [facility];  Service: General;  Laterality: Right;  . CESAREAN SECTION    . CHOLECYSTECTOMY    . ESSURE TUBAL LIGATION    . PORTACATH PLACEMENT Left 07/21/2014   Procedure: INSERTION PORT-A-CATH;  Surgeon: Stark Klein, MD;  Location: [facility];  Service: General;  Laterality: Left;  . WISDOM TOOTH EXTRACTION      SOCIAL HISTORY: History   Social History  . Marital Status: Married    Spouse Name: N/A    Number of Children:  she has 2 daughters at the age of 31 and 19.   . Years of Education: N/A   Occupational History  .  works as Freight forwarder for a [employer].    Social History Main Topics  . Smoking status: Never Smoker   . Smokeless tobacco: Not on file  . Alcohol Use: Yes  . Drug Use: No  . Sexual Activity: No    FAMILY HISTORY: Family History  Problem Relation Age of Onset  . Breast cancer Mother 30  . Liver cancer Father 72  . Breast cancer Maternal Aunt 36  . Prostate cancer Maternal Grandfather   . Liver cancer Paternal Grandmother   . Prostate cancer Paternal Grandfather      GENETICS: 08/30/2014 BreastNext panel was negative. 17 genes including BRCA1, BRCA2, were negative for mutations.   ALLERGIES:  is allergic to adhesive [tape]; aspirin; and codeine.  MEDICATIONS:  Current Outpatient Prescriptions  Medication Sig  Dispense Refill  . Calcium-Phosphorus-Vitamin D (CALCIUM GUMMIES PO) Take by mouth daily.    . Camphor-Eucalyptus-Menthol (VICKS VAPORUB EX) Apply 1 application topically at bedtime. Applies under nose, throat and on chest.    . cetirizine (ZYRTEC) 10 MG tablet Take 10 mg by mouth daily as needed for allergies (allergies).     . fexofenadine (ALLEGRA) 180 MG tablet Take 180 mg by mouth daily as needed.  4  . fluticasone (FLONASE) 50 MCG/ACT nasal spray   1  . lidocaine-prilocaine (EMLA) cream APPLY TOPICALLY TO THE AFFECTED AREA AS NEEDED 30 g 0  . loperamide (IMODIUM A-D) 2 MG tablet Take 1 tablet (2 mg total) by mouth 4 (four) times daily as needed for diarrhea  or loose stools. 30 tablet 0  . metaxalone (SKELAXIN) 800 MG tablet Take 1 tablet (800 mg total) by mouth 3 (three) times daily. 30 tablet 0  . Multiple Vitamins-Minerals (AIRBORNE PO) Take 1 tablet by mouth daily.    . Multiple Vitamins-Minerals (MULTIVITAMIN GUMMIES ADULT PO) Take 1 each by mouth daily. Women's Vitafusion gummie    . nystatin (NYSTATIN) powder Apply topically 2 (two) times daily. 15 g 2  . triamcinolone (KENALOG) 0.025 % ointment Apply 1 application topically 2 (two) times daily. 30 g 1  . albuterol (PROVENTIL HFA;VENTOLIN HFA) 108 (90 Base) MCG/ACT inhaler Inhale 2 puffs into the lungs every 6 (six) hours as needed for wheezing or shortness of breath. (Patient not taking: Reported on 09/04/2016) 1 Inhaler 2  . hydrocortisone 2.5 % cream APPLY TOPICALLY TO THE AFFECTED AREA TWICE DAILY (Patient not taking: Reported on 09/04/2016) 454 g 0  . LORazepam (ATIVAN) 0.5 MG tablet Take 1 tablet (0.5 mg total) by mouth every 8 (eight) hours. (Patient not taking: Reported on 10/16/2016) 3 tablet 0   No current facility-administered medications for this visit.    Facility-Administered Medications Ordered in Other Visits  Medication Dose Route Frequency Provider Last Rate Last Dose  . acetaminophen (TYLENOL) tablet 650 mg  650 mg  Oral Once [name], MD      . sodium chloride 0.9 % injection 10 mL  10 mL Intracatheter PRN [name], MD   10 mL at 05/01/16 1622    REVIEW OF SYSTEMS:   Constitutional: Denies fevers, chills or abnormal night sweats (+) Low grade fever  Eyes: Denies blurriness of vision, double vision or watery eyes Ears, nose, mouth, throat, and face: Denies mucositis or sore throat (+) Sinus congestion  Respiratory: Occasional dry cough, no dyspnea or wheezes Cardiovascular: Denies palpitation, chest discomfort or lower extremity swelling Gastrointestinal:  Denies nausea, heartburn or change in bowel habits Skin: Negative Lymphatics: Denies new lymphadenopathy or easy bruising MSK: (+) temporary body aches after infusion Neurological:Denies numbness, new weaknesses (+) tingling in fingers Behavioral/Psych: Mood is stable, no new changes  All other systems were reviewed with the patient and are negative.  PHYSICAL EXAMINATION: ECOG PERFORMANCE STATUS: 0  Vitals:   01/23/17 1556  BP: (!) 151/71  Pulse: 87  Resp: 20  Temp: 98.6 F (37 C)  TempSrc: Oral  SpO2: 100%  Weight: 239 lb 6.4 oz (108.6 kg)  Height: _0  (1.778 m)     GENERAL:alert, no distress and comfortable SKIN: skin color, texture, turgor are normal, no rashes or significant lesions EYES: normal, conjunctiva are pink and non-injected, sclera clear OROPHARYNX:no exudate, no erythema and lips, buccal mucosa, and tongue normal  NECK: supple, thyroid normal size, non-tender, without nodularity LYMPH:  no palpable lymphadenopathy in the cervical, axillary or inguinal LUNGS: clear to auscultation and percussion with normal breathing effort HEART: regular rate & rhythm and no murmurs and no lower extremity edema ABDOMEN:abdomen soft, non-tender and normal bowel sounds Musculoskeletal:no cyanosis of digits and no clubbing, no tenderness  PSYCH: alert & oriented x 3 with fluent speech NEURO: no focal motor/sensory  deficits Breasts:  Status post right breast lumpectomy and sentinel lymph node biopsy, incisions have healed well. The previous palpable seroma has resolved. (+) mild skin pigmentation of right breast secondary to radiation. Palpation of both breasts  and axillas revealed no other obvious mass that I could appreciate.  Skin: No skin rashes.   LABORATORY DATA:  I have reviewed the data as listed  CBC Latest Ref Rng & Units 01/21/2017 12/19/2016 11/27/2016  WBC 3.9 - 10.3 10e3/uL 6.4 5.6 7.0  Hemoglobin 11.6 - 15.9 g/dL 12.1 12.3 11.9  Hematocrit 34.8 - 46.6 % 36.8 36.2 35.6  Platelets 145 - 400 10e3/uL 237 231 222    CMP Latest Ref Rng & Units 01/21/2017 12/19/2016 11/27/2016  Glucose 70 - 140 mg/dl 81 80 148(H)  BUN 7.0 - 26.0 mg/dL 11.5 11.8 13.8  Creatinine 0.6 - 1.1 mg/dL 0.8 0.8 0.8  Sodium 136 - 145 mEq/L 141 140 143  Potassium 3.5 - 5.1 mEq/L 4.0 4.0 3.9  Chloride 101 - 111 mmol/L - - -  CO2 22 - 29 mEq/L _0 Calcium 8.4 - 10.4 mg/dL 9.6 9.2 9.5  Total Protein 6.4 - 8.3 g/dL 7.6 7.4 7.7  Total Bilirubin 0.20 - 1.20 mg/dL 0.28 0.32 0.37  Alkaline Phos 40 - 150 U/L 72 64 77  AST 5 - 34 U/L _1 ALT 0 - 55 U/L 21 26 32    PATHOLOGY REPORT 07/09/2014 #1 breast, right needle core biopsy more anterior -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 45)  Lung, biopsy, Left 07/26/2014 - HIGH GRADE CARCINOMA, SEE COMMENT. Microscopic Comment The carcinoma demonstrates the following immunophenotype: TTF-1 - negative expression. Napsin A- negative expression. CK5/6 - focal, moderate expression. estrogen receptor - negative expression. GCDFP- negative expression. The recent breast biopsy demonstrating Her2 amplified high grade invasive mammary carcinoma is  noted ([id number]). Although the immunophenotype of the current case is non-sepcific, it strongly argues against primary lung adenocarcinoma. However, on re-review, the morphology of the current case is essentially identical to the primary mammary carcinoma. In lieu of further immunophenotyping, the tumor will be submitted for Her2 testing and the remaining tissue will be reserved for additional ancillary tumor testing. The results of the Her2 testing will be reported in an addendum. The case was discussed with Dr Burr Medico on 07/28/2015 and 07/29/2015 HER2 POSITIVE   Diagnosis 03/29/2015 1. Breast, lumpectomy, Right - INVASIVE DUCTAL CARCINOMA, SEE COMMENT. - SEE TUMOR SYNOPTIC TEMPLATE BELOW. 2. Lymph node, sentinel, biopsy, Right axillary #1 - ONE LYMPH NODE, NEGATIVE FOR TUMOR (0/1). - SEE COMMENT. 3. Lymph node, sentinel, biopsy, Right axillary #2 - BENIGN FIBROFATTY SOFT TISSUE. - NEGATIVE FOR ATYPIA OR MALIGNANCY. - NEGATIVE FOR LYMPH NODE. 4. Lymph node, sentinel, biopsy, Right axillary #3 - ONE LYMPH NODE, NEGATIVE FOR TUMOR (0/1). - SEE COMMENT. 5. Lymph node, sentinel, biopsy, Right axillary #4 - ONE LYMPH NODE, NEGATIVE FOR TUMOR (0/1). - SEE COMMENT. 6. Lymph node, sentinel, biopsy, Right axillary #5 - ONE LYMPH NODE, NEGATIVE FOR TUMOR (0/1). - SEE COMMENT. 7. Lymph node, sentinel, biopsy, right axillary - ONE LYMPH NODE, NEGATIVE FOR TUMOR (0/1). - SEE COMMENT. 8. Lymph node, sentinel, biopsy, right axillary - ONE LYMPH NODE, NEGATIVE FOR TUMOR (0/1). - SEE COMMENT. 1 of 4 Amended copy Amended FINAL for ZENITH, KERCHEVAL ([id number]) Microscopic Comment 1. BREAST, INVASIVE TUMOR, WITH LYMPH NODES PRESENT Specimen, including laterality and lymph node sampling (sentinel, non-sentinel): Right breast with sentinel and non-sentinel lymph node sampling Procedure: Lumpectomy Histologic type: Ductal, see comment Grade: 2 of 3, see comment. Tubule formation: 3 Nuclear  pleomorphism: 3 Mitotic: 1 Tumor size (glass slide measurement): 1 mm, see comment Margins: Invasive, distance to  closest margin: See comment In-situ, distance to closest margin: N/A If margin positive, focally or broadly: N/A Lymphovascular invasion: Absent Ductal carcinoma in situ: Absent Grade: N/A Extensive intraductal component: N/A Lobular neoplasia: Absent Tumor focality: Unifocal, see comment Treatment effect: Present If present, treatment effect in breast tissue, lymph nodes or both: Both breast tissue and lymph nodes Extent of tumor: Skin: N/A Nipple: N/A Skeletal muscle: N/A Lymph nodes: Examined: 5 Sentinel 2 Non-sentinel 7 Total Lymph nodes with metastasis: 0 Isolated tumor cells (< 0.2 mm): N/A Micrometastasis: (> 0.2 mm and < 2.0 mm): N/A Macrometastasis: (> 2.0 mm): N/A Extracapsular extension: N/A Breast prognostic profile: Estrogen receptor: Not repeated, previous study demonstrated 0% positivity ([id number]). Progesterone receptor: Not repeated, previous study demonstrated 0% positivity ([id number]) Her 2 neu: Demonstrates amplificaition ([id number]) Ki-67: Not repeated, previous study demonstrate 79% proliferation rate ([id number]). Non-neoplastic breast: Neoadjuvant related tissue changes and adenosis. TNM: ypT76m, ypN0, pMX Comments: Numerous representative sections including the 2.0 cm hemorrhagic tissue associated with X-shaped biopsy clip, the 2.0 cm ill defined lesion associated with M-shaped clip, and intervening tissue were submitted for review. Slide sections from the tissue surrounding the X-shaped and M-shaped clips demonstrate tissue changes consistent with neoadjuvant related change. There is no invasive or in situ carcinom present at either site. However, in a representative section from the intervening tissue (slide G), there is a 1 mm focus of high grade invasive ductal carcinoma present. Given the minute size of the tumor, tumor grading  is limited. The tumor is present within non-marginal tissue sections and is considered to be at least 0.5 cm from the nearest margin (medial).   Note: Amendment issued for modification of synoptic table, inadvertent typographical error. The change in the synoptic table was discussed with Dr FBurr Medicoon 04/07/2015. (CRR:ecj 04/07/2015) 2. , 4-8. In parts 2 and 4, there is extensive neoadjuvant related tissue changes including abundant foamy macrophages, fibroinflammatory reaction and dystrophic calcifications. The neoadjuvant tissue changes extend into perinodal soft tissue and are either focally or broadly present at the cauterized edge of the tissue submitted. There are no definitive features of malignancy present.  RADIOGRAPHIC STUDIES:  CT Chest W Contrast 01/22/17 IMPRESSION: 1. Stable exam. No new or progressive findings. No evidence for metastatic disease. 2. Left upper lobe architectural distortion/scarring is stable. 3. Nonobstructing right renal stone.  ECHO 12/12/16 Study Conclusions - Left ventricle: The cavity size was normal. Wall thickness was   normal. Systolic function was normal. The estimated ejection   fraction was in the range of 55% to 60%. Wall motion was normal;   there were no regional wall motion abnormalities. Doppler   parameters are consistent with abnormal left ventricular   relaxation (grade 1 diastolic dysfunction). Impressions: - Normal LV systolic function; mild diastolic dysfunction; trace MR   and TR; global longitudinal strain -19.7%.   ASSESSMENT & PLAN:  46y.o.  African-American female, premenopausal, without significant past medical history, presented with palpable right breast mass and right axillary mass.   1. 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 - I previously reviewed her breasts surgical pathology results with her in great detail ,  She has had fantastic partial response (near complete response) form 8 months of chemotherapy and antibody therapy.   The initial 6.3 cm breast mass has only 1  mm residual tumor,  And the previously 4.4 cm right axillary lymph node has had complete  Pathological response, all 7 nodes were negative for cancer. -I reviewed her restaging CT scan from 01/22/2017, which showed no evidence of disease, the scar tissue in left upper lobe lung is unchanged. No other new lesions -Continue monitoring cardiac echo every 6 months, she will follow up with Dr. Haroldine Laws  -lab previously reviewed, adequate for treatment, we'll continue Herceptin every 3 weeks, indefinitely -She previously had her mammogram done in December 2017, which was unremarkable  -she is clinically doing very well, no clinical suspicion for recurrence   2. Chronic sinusitis  -She has previously had recurrent sinusitis, and bronchitis. Her symptom has resolved lately. -Her symptoms have returned due to allergy season   3. Mild peripheral neuropathy, G1 -Probably related to her prior chemotherapy -previously very mild, we'll continue monitoring.   4. Right sided chest pain -She has previously been having intermittent right-sided posterior low chest pain, radiates to her lower back. -She has some abnormal uptake on her bone scan in July 2017, possible old fracture. -She has a kidney stone, which has been present since 2015, but I doubt her chest pains related to her case to him. -Her previous pain has near resolved lately   5. HTN? -No prior history of HTN, but her BP has been slightly elevated lately  -She does not have a PCP, but she will find one to monitor this. -I encourage a healthy low salt diet and exercise. She agrees and is going to start walking after work, I also strongly encouraged her to lose  weight. She does not want to take medication, and is motivated to follow diet and exercise  -I again encouraged her to check her BP at home or at a pharmacy. -she will follow up with Dr. Haroldine Laws    6. Obesity  -I previously encouraged her to have healthy diet, exercise regularly, and try to lose some weight.  Plan  -Refill EMLA creme today -Lab, flush on 7/31 -herceptin every 3 weeks starting on 7/10, X3  -F/u on 8/21     I spent about 20 minutes counseling the patient and her family members, total care was about 25 minutes.  This document serves as a record of services personally performed by [name], MD. It was created on her behalf by Joslyn Devon, a trained medical scribe. The creation of this record is based on the scribe's personal observations and the provider's statements to them. This document has been checked and approved by the attending provider.   I have reviewed the above documentation for accuracy and completeness and I agree with the above.  [name]  01/23/2017

## 2017-01-21 ENCOUNTER — Ambulatory Visit (HOSPITAL_BASED_OUTPATIENT_CLINIC_OR_DEPARTMENT_OTHER): Payer: 59

## 2017-01-21 ENCOUNTER — Ambulatory Visit (HOSPITAL_COMMUNITY)
Admission: RE | Admit: 2017-01-21 | Discharge: 2017-01-21 | Disposition: A | Discharge status: Home / Self Care | Payer: 59 | Source: Ambulatory Visit | Attending: Hematology | Admitting: Hematology

## 2017-01-21 ENCOUNTER — Other Ambulatory Visit (HOSPITAL_BASED_OUTPATIENT_CLINIC_OR_DEPARTMENT_OTHER): Payer: 59

## 2017-01-21 DIAGNOSIS — C7800 Secondary malignant neoplasm of unspecified lung: Principal | ICD-10-CM

## 2017-01-21 DIAGNOSIS — C50911 Malignant neoplasm of unspecified site of right female breast: Secondary | ICD-10-CM

## 2017-01-21 DIAGNOSIS — N200 Calculus of kidney: Secondary | ICD-10-CM | POA: Insufficient documentation

## 2017-01-21 DIAGNOSIS — R918 Other nonspecific abnormal finding of lung field: Secondary | ICD-10-CM | POA: Diagnosis not present

## 2017-01-21 DIAGNOSIS — Z452 Encounter for adjustment and management of vascular access device: Secondary | ICD-10-CM | POA: Diagnosis not present

## 2017-01-21 DIAGNOSIS — Z95828 Presence of other vascular implants and grafts: Secondary | ICD-10-CM

## 2017-01-21 LAB — CBC WITH DIFFERENTIAL/PLATELET
BASO%: 0.2 % (ref 0.0–2.0)
BASOS ABS: 0 10*3/uL (ref 0.0–0.1)
EOS%: 1.9 % (ref 0.0–7.0)
Eosinophils Absolute: 0.1 10*3/uL (ref 0.0–0.5)
HCT: 36.8 % (ref 34.8–46.6)
HEMOGLOBIN: 12.1 g/dL (ref 11.6–15.9)
LYMPH%: 31.7 % (ref 14.0–49.7)
MCH: 29.5 pg (ref 25.1–34.0)
MCHC: 32.9 g/dL (ref 31.5–36.0)
MCV: 89.8 fL (ref 79.5–101.0)
MONO#: 0.5 10*3/uL (ref 0.1–0.9)
MONO%: 8.3 % (ref 0.0–14.0)
NEUT%: 57.9 % (ref 38.4–76.8)
NEUTROS ABS: 3.7 10*3/uL (ref 1.5–6.5)
PLATELETS: 237 10*3/uL (ref 145–400)
RBC: 4.1 10*6/uL (ref 3.70–5.45)
RDW: 13.3 % (ref 11.2–14.5)
WBC: 6.4 10*3/uL (ref 3.9–10.3)
lymph#: 2 10*3/uL (ref 0.9–3.3)

## 2017-01-21 LAB — COMPREHENSIVE METABOLIC PANEL
ALBUMIN: 3.6 g/dL (ref 3.5–5.0)
ALK PHOS: 72 U/L (ref 40–150)
ALT: 21 U/L (ref 0–55)
ANION GAP: 9 meq/L (ref 3–11)
AST: 19 U/L (ref 5–34)
BILIRUBIN TOTAL: 0.28 mg/dL (ref 0.20–1.20)
BUN: 11.5 mg/dL (ref 7.0–26.0)
CALCIUM: 9.6 mg/dL (ref 8.4–10.4)
CO2: 27 mEq/L (ref 22–29)
Chloride: 105 mEq/L (ref 98–109)
Creatinine: 0.8 mg/dL (ref 0.6–1.1)
Glucose: 81 mg/dl (ref 70–140)
POTASSIUM: 4 meq/L (ref 3.5–5.1)
Sodium: 141 mEq/L (ref 136–145)
TOTAL PROTEIN: 7.6 g/dL (ref 6.4–8.3)

## 2017-01-21 MED ORDER — IOPAMIDOL (ISOVUE-300) INJECTION 61%
30.0000 mL | Freq: Once | INTRAVENOUS | Status: AC | PRN
  Administered 2017-01-21: 30 mL via INTRAVENOUS

## 2017-01-21 MED ORDER — HEPARIN SOD (PORK) LOCK FLUSH 100 UNIT/ML IV SOLN
INTRAVENOUS | Status: AC
  Filled 2017-01-21: qty 5

## 2017-01-21 MED ORDER — IOPAMIDOL (ISOVUE-300) INJECTION 61%
100.0000 mL | Freq: Once | INTRAVENOUS | Status: AC | PRN
  Administered 2017-01-21: 100 mL via INTRAVENOUS

## 2017-01-21 MED ORDER — IOPAMIDOL (ISOVUE-300) INJECTION 61%
INTRAVENOUS | Status: AC
  Filled 2017-01-21: qty 100

## 2017-01-21 MED ORDER — SODIUM CHLORIDE 0.9 % IJ SOLN
10.0000 mL | INTRAMUSCULAR | Status: DC | PRN
  Administered 2017-01-21: 10 mL via INTRAVENOUS
  Filled 2017-01-21: qty 10

## 2017-01-21 MED ORDER — HEPARIN SOD (PORK) LOCK FLUSH 100 UNIT/ML IV SOLN
500.0000 [IU] | Freq: Once | INTRAVENOUS | Status: AC
  Administered 2017-01-21: 500 [IU] via INTRAVENOUS

## 2017-01-21 MED ORDER — IOPAMIDOL (ISOVUE-300) INJECTION 61%
INTRAVENOUS | Status: AC
  Filled 2017-01-21: qty 30

## 2017-01-21 NOTE — Patient Instructions (Signed)

## 2017-01-22 LAB — CANCER ANTIGEN 27.29: CAN 27.29: 5.8 U/mL (ref 0.0–38.6)

## 2017-01-23 ENCOUNTER — Ambulatory Visit (HOSPITAL_BASED_OUTPATIENT_CLINIC_OR_DEPARTMENT_OTHER): Payer: 59 | Admitting: Hematology

## 2017-01-23 VITALS — BP 151/71 | HR 87 | Temp 98.6°F | Resp 20 | Ht 70.0 in | Wt 239.4 lb

## 2017-01-23 DIAGNOSIS — I10 Essential (primary) hypertension: Secondary | ICD-10-CM

## 2017-01-23 DIAGNOSIS — C50511 Malignant neoplasm of lower-outer quadrant of right female breast: Secondary | ICD-10-CM

## 2017-01-23 DIAGNOSIS — C50911 Malignant neoplasm of unspecified site of right female breast: Secondary | ICD-10-CM

## 2017-01-23 DIAGNOSIS — Z171 Estrogen receptor negative status [ER-]: Secondary | ICD-10-CM | POA: Diagnosis not present

## 2017-01-23 DIAGNOSIS — C7800 Secondary malignant neoplasm of unspecified lung: Secondary | ICD-10-CM

## 2017-01-23 MED ORDER — LIDOCAINE-PRILOCAINE 2.5-2.5 % EX CREA: TOPICAL_CREAM | CUTANEOUS | 2 refills | Status: DC

## 2017-01-25 ENCOUNTER — Ambulatory Visit (HOSPITAL_BASED_OUTPATIENT_CLINIC_OR_DEPARTMENT_OTHER): Payer: 59

## 2017-01-25 VITALS — BP 158/76 | HR 84 | Temp 98.4°F | Resp 16

## 2017-01-25 DIAGNOSIS — C7800 Secondary malignant neoplasm of unspecified lung: Secondary | ICD-10-CM

## 2017-01-25 DIAGNOSIS — C50911 Malignant neoplasm of unspecified site of right female breast: Secondary | ICD-10-CM

## 2017-01-25 DIAGNOSIS — Z5112 Encounter for antineoplastic immunotherapy: Secondary | ICD-10-CM | POA: Diagnosis not present

## 2017-01-25 MED ORDER — HEPARIN SOD (PORK) LOCK FLUSH 100 UNIT/ML IV SOLN
500.0000 [IU] | Freq: Once | INTRAVENOUS | Status: AC | PRN
Start: 2017-01-25 — End: 2017-01-25
  Administered 2017-01-25: 500 [IU]
  Filled 2017-01-25: qty 5

## 2017-01-25 MED ORDER — SODIUM CHLORIDE 0.9 % IJ SOLN
10.0000 mL | INTRAMUSCULAR | Status: DC | PRN
  Administered 2017-01-25: 10 mL
  Filled 2017-01-25: qty 10

## 2017-01-25 MED ORDER — TRASTUZUMAB CHEMO 150 MG IV SOLR
600.0000 mg | Freq: Once | INTRAVENOUS | Status: AC
  Administered 2017-01-25: 600 mg via INTRAVENOUS
  Filled 2017-01-25: qty 28.57

## 2017-01-25 MED ORDER — ACETAMINOPHEN 325 MG PO TABS: 650.0000 mg | ORAL_TABLET | Freq: Once | ORAL | Status: DC

## 2017-01-25 MED ORDER — ACETAMINOPHEN 325 MG PO TABS
ORAL_TABLET | ORAL | Status: AC
  Filled 2017-01-25: qty 2

## 2017-01-25 MED ORDER — SODIUM CHLORIDE 0.9 % IV SOLN
Freq: Once | INTRAVENOUS | Status: AC
  Administered 2017-01-25: 16:00:00 via INTRAVENOUS

## 2017-01-25 NOTE — Patient Instructions (Signed)
[facility] Discharge Instructions for Patients Receiving Chemotherapy  Today you received the following chemotherapy agents: Heceptin   To help prevent nausea and vomiting after your treatment, we encourage you to take your nausea medication as directed.    If you develop nausea and vomiting that is not controlled by your nausea medication, call the clinic.   BELOW ARE SYMPTOMS THAT SHOULD BE REPORTED IMMEDIATELY:  *FEVER GREATER THAN 100.5 F  *CHILLS WITH OR WITHOUT FEVER  NAUSEA AND VOMITING THAT IS NOT CONTROLLED WITH YOUR NAUSEA MEDICATION  *UNUSUAL SHORTNESS OF BREATH  *UNUSUAL BRUISING OR BLEEDING  TENDERNESS IN MOUTH AND THROAT WITH OR WITHOUT PRESENCE OF ULCERS  *URINARY PROBLEMS  *BOWEL PROBLEMS  UNUSUAL RASH Items with * indicate a potential emergency and should be followed up as soon as possible.  Feel free to call the clinic you have any questions or concerns. The clinic phone number is [phone].  Please show the [place] [facility] at check-in to the Emergency Department and triage nurse.

## 2017-01-26 ENCOUNTER — Encounter: Payer: Self-pay | Admitting: Hematology

## 2017-01-29 ENCOUNTER — Telehealth: Payer: Self-pay | Admitting: Hematology

## 2017-01-29 NOTE — Telephone Encounter (Signed)
Patient bypassed scheduling on 01/23/17. Called patient to schedule and confirm appointments. Left message on voicemail with appointment details. Appointment letter and schedule, mailed to patient, per 01/23/17 los.

## 2017-02-19 ENCOUNTER — Ambulatory Visit (HOSPITAL_BASED_OUTPATIENT_CLINIC_OR_DEPARTMENT_OTHER): Payer: 59

## 2017-02-19 ENCOUNTER — Other Ambulatory Visit: Payer: 59

## 2017-02-19 VITALS — BP 149/77 | HR 75 | Temp 99.2°F | Resp 16

## 2017-02-19 DIAGNOSIS — C7800 Secondary malignant neoplasm of unspecified lung: Secondary | ICD-10-CM

## 2017-02-19 DIAGNOSIS — C50911 Malignant neoplasm of unspecified site of right female breast: Secondary | ICD-10-CM | POA: Diagnosis not present

## 2017-02-19 DIAGNOSIS — Z5112 Encounter for antineoplastic immunotherapy: Secondary | ICD-10-CM

## 2017-02-19 MED ORDER — TRASTUZUMAB CHEMO INJECTION 440 MG
600.0000 mg | Freq: Once | INTRAVENOUS | Status: AC
  Administered 2017-02-19: 600 mg via INTRAVENOUS
  Filled 2017-02-19: qty 28.57

## 2017-02-19 MED ORDER — SODIUM CHLORIDE 0.9 % IJ SOLN
10.0000 mL | INTRAMUSCULAR | Status: DC | PRN
  Filled 2017-02-19: qty 10

## 2017-02-19 MED ORDER — HEPARIN SOD (PORK) LOCK FLUSH 100 UNIT/ML IV SOLN
500.0000 [IU] | Freq: Once | INTRAVENOUS | Status: DC | PRN
  Filled 2017-02-19: qty 5

## 2017-02-19 MED ORDER — ACETAMINOPHEN 325 MG PO TABS: 650.0000 mg | ORAL_TABLET | Freq: Once | ORAL | Status: DC

## 2017-02-19 MED ORDER — SODIUM CHLORIDE 0.9 % IV SOLN
Freq: Once | INTRAVENOUS | Status: AC
Start: 2017-02-19 — End: 2017-02-19
  Administered 2017-02-19: 17:00:00 via INTRAVENOUS

## 2017-02-19 NOTE — Patient Instructions (Signed)
[facility] Discharge Instructions for Patients Receiving Chemotherapy  Today you received the following chemotherapy agents: Heceptin   To help prevent nausea and vomiting after your treatment, we encourage you to take your nausea medication as directed.    If you develop nausea and vomiting that is not controlled by your nausea medication, call the clinic.   BELOW ARE SYMPTOMS THAT SHOULD BE REPORTED IMMEDIATELY:  *FEVER GREATER THAN 100.5 F  *CHILLS WITH OR WITHOUT FEVER  NAUSEA AND VOMITING THAT IS NOT CONTROLLED WITH YOUR NAUSEA MEDICATION  *UNUSUAL SHORTNESS OF BREATH  *UNUSUAL BRUISING OR BLEEDING  TENDERNESS IN MOUTH AND THROAT WITH OR WITHOUT PRESENCE OF ULCERS  *URINARY PROBLEMS  *BOWEL PROBLEMS  UNUSUAL RASH Items with * indicate a potential emergency and should be followed up as soon as possible.  Feel free to call the clinic you have any questions or concerns. The clinic phone number is [phone].  Please show the [place] at check-in to the Emergency Department and triage nurse.

## 2017-03-12 ENCOUNTER — Ambulatory Visit (HOSPITAL_BASED_OUTPATIENT_CLINIC_OR_DEPARTMENT_OTHER): Payer: 59

## 2017-03-12 ENCOUNTER — Other Ambulatory Visit: Payer: 59

## 2017-03-12 VITALS — BP 149/73 | HR 88 | Temp 98.6°F | Resp 16

## 2017-03-12 DIAGNOSIS — C7800 Secondary malignant neoplasm of unspecified lung: Secondary | ICD-10-CM | POA: Diagnosis not present

## 2017-03-12 DIAGNOSIS — C50911 Malignant neoplasm of unspecified site of right female breast: Secondary | ICD-10-CM

## 2017-03-12 DIAGNOSIS — Z5112 Encounter for antineoplastic immunotherapy: Secondary | ICD-10-CM

## 2017-03-12 MED ORDER — SODIUM CHLORIDE 0.9 % IV SOLN
Freq: Once | INTRAVENOUS | Status: AC
  Administered 2017-03-12: 17:00:00 via INTRAVENOUS

## 2017-03-12 MED ORDER — HEPARIN SOD (PORK) LOCK FLUSH 100 UNIT/ML IV SOLN
500.0000 [IU] | Freq: Once | INTRAVENOUS | Status: AC | PRN
  Administered 2017-03-12: 500 [IU]
  Filled 2017-03-12: qty 5

## 2017-03-12 MED ORDER — SODIUM CHLORIDE 0.9 % IJ SOLN
10.0000 mL | INTRAMUSCULAR | Status: DC | PRN
Start: 2017-03-12 — End: 2017-03-12
  Administered 2017-03-12: 10 mL
  Filled 2017-03-12: qty 10

## 2017-03-12 MED ORDER — TRASTUZUMAB CHEMO 150 MG IV SOLR
600.0000 mg | Freq: Once | INTRAVENOUS | Status: AC
  Administered 2017-03-12: 600 mg via INTRAVENOUS
  Filled 2017-03-12: qty 28.57

## 2017-03-12 NOTE — Patient Instructions (Signed)
[facility] Discharge Instructions for Patients Receiving Chemotherapy  Today you received the following chemotherapy agents:  Herceptin  To help prevent nausea and vomiting after your treatment, we encourage you to take your nausea medication as prescribed.   If you develop nausea and vomiting that is not controlled by your nausea medication, call the clinic.   BELOW ARE SYMPTOMS THAT SHOULD BE REPORTED IMMEDIATELY:  *FEVER GREATER THAN 100.5 F  *CHILLS WITH OR WITHOUT FEVER  NAUSEA AND VOMITING THAT IS NOT CONTROLLED WITH YOUR NAUSEA MEDICATION  *UNUSUAL SHORTNESS OF BREATH  *UNUSUAL BRUISING OR BLEEDING  TENDERNESS IN MOUTH AND THROAT WITH OR WITHOUT PRESENCE OF ULCERS  *URINARY PROBLEMS  *BOWEL PROBLEMS  UNUSUAL RASH Items with * indicate a potential emergency and should be followed up as soon as possible.  Feel free to call the clinic you have any questions or concerns. The clinic phone number is [phone].  Please show the CHEMO ALERT CARD at check-in to the Emergency Department and triage nurse.   

## 2017-03-12 NOTE — Progress Notes (Signed)
Pt states she does not take Tylenol or Benadryl with the Herceptin.

## 2017-04-01 ENCOUNTER — Telehealth: Payer: Self-pay | Admitting: Hematology

## 2017-04-01 ENCOUNTER — Other Ambulatory Visit (HOSPITAL_BASED_OUTPATIENT_CLINIC_OR_DEPARTMENT_OTHER): Payer: 59

## 2017-04-01 ENCOUNTER — Ambulatory Visit (HOSPITAL_BASED_OUTPATIENT_CLINIC_OR_DEPARTMENT_OTHER): Payer: 59

## 2017-04-01 ENCOUNTER — Ambulatory Visit: Payer: 59 | Admitting: Family Medicine

## 2017-04-01 ENCOUNTER — Ambulatory Visit (HOSPITAL_BASED_OUTPATIENT_CLINIC_OR_DEPARTMENT_OTHER): Payer: 59 | Admitting: Hematology

## 2017-04-01 VITALS — BP 150/79 | HR 78 | Temp 98.7°F | Resp 20 | Ht 70.0 in | Wt 235.3 lb

## 2017-04-01 DIAGNOSIS — R030 Elevated blood-pressure reading, without diagnosis of hypertension: Secondary | ICD-10-CM

## 2017-04-01 DIAGNOSIS — Z452 Encounter for adjustment and management of vascular access device: Secondary | ICD-10-CM

## 2017-04-01 DIAGNOSIS — C50911 Malignant neoplasm of unspecified site of right female breast: Secondary | ICD-10-CM | POA: Diagnosis not present

## 2017-04-01 DIAGNOSIS — Z853 Personal history of malignant neoplasm of breast: Secondary | ICD-10-CM | POA: Diagnosis not present

## 2017-04-01 DIAGNOSIS — J329 Chronic sinusitis, unspecified: Secondary | ICD-10-CM

## 2017-04-01 DIAGNOSIS — Z95828 Presence of other vascular implants and grafts: Secondary | ICD-10-CM

## 2017-04-01 DIAGNOSIS — G629 Polyneuropathy, unspecified: Secondary | ICD-10-CM | POA: Diagnosis not present

## 2017-04-01 DIAGNOSIS — C7800 Secondary malignant neoplasm of unspecified lung: Principal | ICD-10-CM

## 2017-04-01 LAB — COMPREHENSIVE METABOLIC PANEL
ALT: 19 U/L (ref 0–55)
AST: 18 U/L (ref 5–34)
Albumin: 3.4 g/dL — ABNORMAL LOW (ref 3.5–5.0)
Alkaline Phosphatase: 74 U/L (ref 40–150)
Anion Gap: 6 mEq/L (ref 3–11)
BUN: 15.4 mg/dL (ref 7.0–26.0)
CHLORIDE: 105 meq/L (ref 98–109)
CO2: 29 meq/L (ref 22–29)
Calcium: 9.6 mg/dL (ref 8.4–10.4)
Creatinine: 1 mg/dL (ref 0.6–1.1)
EGFR: 81 mL/min/{1.73_m2} — AB (ref >90)
GLUCOSE: 88 mg/dL (ref 70–140)
POTASSIUM: 4 meq/L (ref 3.5–5.1)
SODIUM: 140 meq/L (ref 136–145)
Total Bilirubin: 0.32 mg/dL (ref 0.20–1.20)
Total Protein: 7.5 g/dL (ref 6.4–8.3)

## 2017-04-01 LAB — CBC WITH DIFFERENTIAL/PLATELET
BASO%: 0.2 % (ref 0.0–2.0)
Basophils Absolute: 0 10*3/uL (ref 0.0–0.1)
EOS%: 1.8 % (ref 0.0–7.0)
Eosinophils Absolute: 0.1 10*3/uL (ref 0.0–0.5)
HCT: 37.2 % (ref 34.8–46.6)
HEMOGLOBIN: 12 g/dL (ref 11.6–15.9)
LYMPH%: 27.5 % (ref 14.0–49.7)
MCH: 29.1 pg (ref 25.1–34.0)
MCHC: 32.3 g/dL (ref 31.5–36.0)
MCV: 90.3 fL (ref 79.5–101.0)
MONO#: 0.7 10*3/uL (ref 0.1–0.9)
MONO%: 10.1 % (ref 0.0–14.0)
NEUT#: 4 10*3/uL (ref 1.5–6.5)
NEUT%: 60.4 % (ref 38.4–76.8)
Platelets: 230 10*3/uL (ref 145–400)
RBC: 4.12 10*6/uL (ref 3.70–5.45)
RDW: 13.5 % (ref 11.2–14.5)
WBC: 6.7 10*3/uL (ref 3.9–10.3)
lymph#: 1.8 10*3/uL (ref 0.9–3.3)

## 2017-04-01 MED ORDER — HEPARIN SOD (PORK) LOCK FLUSH 100 UNIT/ML IV SOLN
500.0000 [IU] | Freq: Once | INTRAVENOUS | Status: AC | PRN
  Administered 2017-04-01: 500 [IU] via INTRAVENOUS
  Filled 2017-04-01: qty 5

## 2017-04-01 MED ORDER — SODIUM CHLORIDE 0.9 % IJ SOLN
10.0000 mL | INTRAMUSCULAR | Status: DC | PRN
  Administered 2017-04-01: 10 mL via INTRAVENOUS
  Filled 2017-04-01: qty 10

## 2017-04-01 MED ORDER — FLUCONAZOLE 100 MG PO TABS: 100.0000 mg | ORAL_TABLET | Freq: Every day | ORAL | 0 refills | Status: DC

## 2017-04-01 NOTE — Telephone Encounter (Signed)
Scheduled appt per 8/20 los - patient did not want AVS or calender printed.

## 2017-04-01 NOTE — Progress Notes (Signed)
[facility] Follow up note   Patient Care Team: System, Pcp Not In as PCP - [place], [place], DO (Family Medicine) Holley Bouche, NP as Nurse Practitioner (Nurse Practitioner) Stark Klein, MD as Consulting Physician (General Surgery) Arloa Koh, MD as Consulting Physician (Radiation Oncology) [name], MD as Consulting Physician (Hematology)  CHIEF COMPLAINTS Follow up metastatic breast cancer  Oncology History   Breast cancer 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   Staging form: Breast, AJCC 7th Edition     Clinical stage from 07/22/2014: Stage IV (T3, N1, M1) - Unsigned       Breast cancer 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 ([place])   07/02/2014 Mammogram    Mammogram showed a 2cm right beast mass and a 1.8cm right axillary node. MRI breast on 07/16/2014 showed 7cm R breast lesion and 4.4cm r axillary node       07/02/2014 Imaging    CT CAP: a 4.7cm mass in LUL lung and a 2.1cm mas in RML, and a small nodule in RUL, suspecious for metastasis        07/09/2014 Initial Diagnosis    right IDA with b/l lung lesions, ER-/PR-/HER2+      07/09/2014 Initial Biopsy    US guided right breast mass and axillary node biopsy showed IDA, and DCIS, ER-/PR-/HER2+      07/26/2014 Pathologic Stage    Left lung mass by IR, path revealed high grade carcinoma, morphology similar to breast tumor biopsy, TTF(-), NapsinA(-), ER(-)      08/04/2014 - 03/22/2015 Chemotherapy    weekly Paclitaxel 38m/m2, trastuzumab and pertuzumab every 3 weeks      08/30/2014 Genetic Testing    BreastNext panel was negative. 17 genes including BRCA1, BRCA2, were negative for mutations.       10/04/2014 Imaging    Interval decrease in the right axillary lymphadenopathy. Bilateral pulmonary lesions with left hilar lymphadenopathy also markedly decreased in the interval. The left hilar lymphadenopathy has resolved.      12/20/2014 Imaging    restaging CT showed stable disease, no new  lesions       03/29/2015 Pathology Results     right breast lumpectomy showed  chemotherapy treatment effect,  a 1 mm residual tumor,   margins were widely negative, 5 sentinel lymph nodes and 2 axillary lymph nodes were negative.       03/29/2015 Surgery     right breast lumpectomy and sentinel lymph node biopsy  by Dr. BBarry Dienes     04/12/2015 -  Chemotherapy     Herceptin maintenance therapy , 6 mg/kg, every 3 weeks      05/03/2015 - 06/14/2015 Radiation Therapy    right breast adjuvant irradiation by Dr. MValere Dross      08/28/2016 Imaging    CT CAP w Contrast  IMPRESSION: 1. No acute process or evidence of metastatic disease within the chest, abdomen, or pelvis. 2. Similar to less well-defined left upper lobe density, likely scarring. No evidence of new or progressive pulmonary metastasis. 3. Right nephrolithiasis.      01/22/2017 Imaging    CT Chest W Contrast 01/22/17 IMPRESSION: 1. Stable exam. No new or progressive findings. No evidence for metastatic disease. 2. Left upper lobe architectural distortion/scarring is stable. 3. Nonobstructing right renal stone.       CURRENT THERAPY: Herceptin maintenance therapy every 3 weeks, started on 04/12/2015  INTERVAL HISTORY:   [name] for follow-up. She will return for Herceptin treatment tomorrow. She is doing well  overall. She has had sinus issues since the last time she was seen in our office. She is not pleased with her current PCP. She took antibiotics for the sinus infection as prescribed by PCP and called with yeast infection requesting anti-fungal medication and still has not received any from PCP. She is requesting anti-fungal medication today. She has no other concerns or complaints today. She has noticed she is more sensitive to scents since beginning treatment and believes this is her new normal. She denies any pain in the breast. She does report ankle stiffness/discomft. She would put her feet up to help. This ankle  stiffness/discomfort increases after sitting for long periods of time, for instance at work. She does not feel the need to take medication for this discomfort.   MEDICAL HISTORY:  Past Medical History:  Diagnosis Date  . Breast cancer ([place])   . Breast cancer ([place])   . GERD (gastroesophageal reflux disease)    during pregnancy   . Pneumonia    hx of pneumonia 08/2013   . S/P radiation therapy 05/03/2015 through 06/14/2015    Right breast 4680 cGy in 26 sessions, right breast boost 1000 cGy in 5 sessions. Right supraclavicular/axillary region 4680 cGy with a supplemental PA field to bring the axillary dose up to 4500 cGy in 26 sessions    SURGICAL HISTORY: Past Surgical History:  Procedure Laterality Date  . BREAST LUMPECTOMY WITH RADIOACTIVE SEED AND SENTINEL LYMPH NODE BIOPSY Right 03/29/2015   Procedure: BREAST LUMPECTOMY WITH RADIOACTIVE SEED AND SENTINEL LYMPH NODE BIOPSY;  Surgeon: Stark Klein, MD;  Location: [facility];  Service: General;  Laterality: Right;  . CESAREAN SECTION    . CHOLECYSTECTOMY    . ESSURE TUBAL LIGATION    . PORTACATH PLACEMENT Left 07/21/2014   Procedure: INSERTION PORT-A-CATH;  Surgeon: Stark Klein, MD;  Location: [facility];  Service: General;  Laterality: Left;  . WISDOM TOOTH EXTRACTION      SOCIAL HISTORY: Social History   Social History  . Marital status: Married    Spouse name: N/A  . Number of children: N/A  . Years of education: N/A   Social History Main Topics  . Smoking status: Never Smoker  . Smokeless tobacco: Never Used  . Alcohol use Yes     Comment: 2-3 drinks per week   . Drug use: No  . Sexual activity: No   Other Topics Concern  . Not on file   Social History Narrative  . No narrative on file    FAMILY HISTORY: Family History  Problem Relation Age of Onset  . Breast cancer Mother 38  . Liver cancer Father   . Breast cancer Maternal Aunt 92  . Breast  cancer Maternal Aunt 70  . Prostate cancer Maternal Grandfather 42  . Liver cancer Paternal Grandmother   . Prostate cancer Paternal Grandfather   . Prostate cancer Maternal Uncle 73  . Prostate cancer Maternal Uncle 68  . Breast cancer Cousin 26       mat first cousin    GENETICS: 08/30/2014 BreastNext panel was negative. 17 genes including BRCA1, BRCA2, were negative for mutations.   ALLERGIES:  is allergic to adhesive [tape]; aspirin; and codeine.  MEDICATIONS:  Current Outpatient Prescriptions  Medication Sig Dispense Refill  . albuterol (PROVENTIL HFA;VENTOLIN HFA) 108 (90 Base) MCG/ACT inhaler Inhale 2 puffs into the lungs every 6 (six) hours as needed for wheezing or shortness of breath. (Patient not taking: Reported on 09/04/2016) 1 Inhaler 2  .  Calcium-Phosphorus-Vitamin D (CALCIUM GUMMIES PO) Take by mouth daily.    . Camphor-Eucalyptus-Menthol (VICKS VAPORUB EX) Apply 1 application topically at bedtime. Applies under nose, throat and on chest.    . cetirizine (ZYRTEC) 10 MG tablet Take 10 mg by mouth daily as needed for allergies (allergies).     . fexofenadine (ALLEGRA) 180 MG tablet Take 180 mg by mouth daily as needed.  4  . fluconazole (DIFLUCAN) 100 MG tablet Take 1 tablet (100 mg total) by mouth daily. 1 tablet 0  . fluticasone (FLONASE) 50 MCG/ACT nasal spray   1  . hydrocortisone 2.5 % cream APPLY TOPICALLY TO THE AFFECTED AREA TWICE DAILY (Patient not taking: Reported on 09/04/2016) 454 g 0  . lidocaine-prilocaine (EMLA) cream APPLY TOPICALLY TO THE AFFECTED AREA AS NEEDED 30 g 2  . loperamide (IMODIUM A-D) 2 MG tablet Take 1 tablet (2 mg total) by mouth 4 (four) times daily as needed for diarrhea or loose stools. 30 tablet 0  . LORazepam (ATIVAN) 0.5 MG tablet Take 1 tablet (0.5 mg total) by mouth every 8 (eight) hours. (Patient not taking: Reported on 10/16/2016) 3 tablet 0  . metaxalone (SKELAXIN) 800 MG tablet Take 1 tablet (800 mg total) by mouth 3 (three) times  daily. 30 tablet 0  . Multiple Vitamins-Minerals (AIRBORNE PO) Take 1 tablet by mouth daily.    . Multiple Vitamins-Minerals (MULTIVITAMIN GUMMIES ADULT PO) Take 1 each by mouth daily. Women's Vitafusion gummie    . nystatin (NYSTATIN) powder Apply topically 2 (two) times daily. 15 g 2  . triamcinolone (KENALOG) 0.025 % ointment Apply 1 application topically 2 (two) times daily. 30 g 1   No current facility-administered medications for this visit.    Facility-Administered Medications Ordered in Other Visits  Medication Dose Route Frequency Provider Last Rate Last Dose  . acetaminophen (TYLENOL) tablet 650 mg  650 mg Oral Once [name], MD      . sodium chloride 0.9 % injection 10 mL  10 mL Intracatheter PRN [name], MD   10 mL at 05/01/16 1622    REVIEW OF SYSTEMS:   Constitutional: Denies fevers, chills or abnormal night sweats  Eyes: Denies blurriness of vision, double vision or watery eyes Ears, nose, mouth, throat, and face: Denies mucositis or sore throat Respiratory: Occasional dry cough, no dyspnea or wheezes Cardiovascular: Denies palpitation, chest discomfort or lower extremity swelling Gastrointestinal:  Denies nausea, heartburn or change in bowel habits Skin: (+) yeast infection secondary to antibiotic regimen Lymphatics: Denies new lymphadenopathy or easy bruising MSK: (+) ankle discomfort/stiffness Neurological:Denies numbness, new weaknesses (+) tingling in fingers Behavioral/Psych: Mood is stable, no new changes  All other systems were reviewed with the patient and are negative.  PHYSICAL EXAMINATION: ECOG PERFORMANCE STATUS: 0  Vitals:   04/01/17 1104  BP: (!) 150/79  Pulse: 78  Resp: 20  Temp: 98.7 F (37.1 C)  TempSrc: Oral  SpO2: 99%  Weight: 235 lb 4.8 oz (106.7 kg)  Height: 5' 10"  (1.778 m)     GENERAL:alert, no distress and comfortable SKIN: skin color, texture, turgor are normal, no rashes or significant lesions EYES: normal, conjunctiva are  pink and non-injected, sclera clear OROPHARYNX:no exudate, no erythema and lips, buccal mucosa, and tongue normal  NECK: supple, thyroid normal size, non-tender, without nodularity LYMPH:  no palpable lymphadenopathy in the cervical, axillary or inguinal LUNGS: clear to auscultation and percussion with normal breathing effort HEART: regular rate & rhythm and no murmurs and no lower extremity  edema ABDOMEN:abdomen soft, non-tender and normal bowel sounds Musculoskeletal:no cyanosis of digits and no clubbing, no tenderness  PSYCH: alert & oriented x 3 with fluent speech NEURO: no focal motor/sensory deficits Breasts:  Status post right breast lumpectomy and sentinel lymph node biopsy, incisions have healed well. The previous palpable seroma has resolved. (+) mild skin pigmentation of right breast secondary to radiation. Palpation of both breasts  and axillas revealed no other obvious mass that I could appreciate.  Skin: No skin rashes.   LABORATORY DATA:  I have reviewed the data as listed CBC Latest Ref Rng & Units 04/01/2017 01/21/2017 12/19/2016  WBC 3.9 - 10.3 10e3/uL 6.7 6.4 5.6  Hemoglobin 11.6 - 15.9 g/dL 12.0 12.1 12.3  Hematocrit 34.8 - 46.6 % 37.2 36.8 36.2  Platelets 145 - 400 10e3/uL 230 237 231    CMP Latest Ref Rng & Units 04/01/2017 01/21/2017 12/19/2016  Glucose 70 - 140 mg/dl 88 81 80  BUN 7.0 - 26.0 mg/dL 15.4 11.5 11.8  Creatinine 0.6 - 1.1 mg/dL 1.0 0.8 0.8  Sodium 136 - 145 mEq/L 140 141 140  Potassium 3.5 - 5.1 mEq/L 4.0 4.0 4.0  Chloride 101 - 111 mmol/L - - -  CO2 22 - 29 mEq/L 29 27 25   Calcium 8.4 - 10.4 mg/dL 9.6 9.6 9.2  Total Protein 6.4 - 8.3 g/dL 7.5 7.6 7.4  Total Bilirubin 0.20 - 1.20 mg/dL 0.32 0.28 0.32  Alkaline Phos 40 - 150 U/L 74 72 64  AST 5 - 34 U/L 18 19 21   ALT 0 - 55 U/L 19 21 26     PATHOLOGY REPORT 07/09/2014 #1 breast, right needle core biopsy more anterior -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 45)  Lung, biopsy, Left 07/26/2014 - HIGH GRADE CARCINOMA, SEE COMMENT. Microscopic Comment The carcinoma demonstrates the following immunophenotype: TTF-1 - negative expression. Napsin A- negative expression. CK5/6 - focal, moderate expression. estrogen receptor - negative expression. GCDFP- negative expression. The recent breast biopsy demonstrating Her2 amplified high grade invasive mammary carcinoma is noted ([id number]). Although the immunophenotype of the current case is non-sepcific, it strongly argues against primary lung adenocarcinoma. However, on re-review, the morphology of the current case is essentially identical to the primary mammary carcinoma. In lieu of further immunophenotyping, the tumor will be submitted for Her2 testing and the remaining tissue will be reserved for additional ancillary tumor testing. The results of the Her2 testing will be reported in an addendum. The case was discussed with Dr Burr Medico on 07/28/2015 and 07/29/2015 HER2 POSITIVE   Diagnosis 03/29/2015 1. Breast, lumpectomy, Right - INVASIVE DUCTAL CARCINOMA, SEE COMMENT. - SEE TUMOR SYNOPTIC TEMPLATE BELOW. 2. Lymph node, sentinel, biopsy, Right axillary #1 - ONE LYMPH NODE, NEGATIVE FOR TUMOR (0/1). - SEE COMMENT. 3. Lymph node, sentinel, biopsy, Right axillary #2 - BENIGN FIBROFATTY SOFT TISSUE. - NEGATIVE FOR ATYPIA OR MALIGNANCY. - NEGATIVE FOR LYMPH NODE. 4. Lymph node, sentinel, biopsy, Right axillary #3 - ONE LYMPH NODE, NEGATIVE FOR TUMOR (0/1). - SEE COMMENT. 5. Lymph node, sentinel, biopsy, Right axillary #4 - ONE LYMPH NODE, NEGATIVE FOR TUMOR (0/1). - SEE COMMENT. 6. Lymph node, sentinel, biopsy, Right axillary #5 - ONE LYMPH NODE, NEGATIVE FOR TUMOR (0/1). - SEE COMMENT. 7. Lymph node, sentinel,  biopsy, right axillary - ONE LYMPH NODE, NEGATIVE FOR  TUMOR (0/1). - SEE COMMENT. 8. Lymph node, sentinel, biopsy, right axillary - ONE LYMPH NODE, NEGATIVE FOR TUMOR (0/1). - SEE COMMENT. 1 of 4 Amended copy Amended FINAL for [name] [name], [name] [name] ([id number]) Microscopic Comment 1. BREAST, INVASIVE TUMOR, WITH LYMPH NODES PRESENT Specimen, including laterality and lymph node sampling (sentinel, non-sentinel): Right breast with sentinel and non-sentinel lymph node sampling Procedure: Lumpectomy Histologic type: Ductal, see comment Grade: 2 of 3, see comment. Tubule formation: 3 Nuclear pleomorphism: 3 Mitotic: 1 Tumor size (glass slide measurement): 1 mm, see comment Margins: Invasive, distance to closest margin: See comment In-situ, distance to closest margin: N/A If margin positive, focally or broadly: N/A Lymphovascular invasion: Absent Ductal carcinoma in situ: Absent Grade: N/A Extensive intraductal component: N/A Lobular neoplasia: Absent Tumor focality: Unifocal, see comment Treatment effect: Present If present, treatment effect in breast tissue, lymph nodes or both: Both breast tissue and lymph nodes Extent of tumor: Skin: N/A Nipple: N/A Skeletal muscle: N/A Lymph nodes: Examined: 5 Sentinel 2 Non-sentinel 7 Total Lymph nodes with metastasis: 0 Isolated tumor cells (< 0.2 mm): N/A Micrometastasis: (> 0.2 mm and < 2.0 mm): N/A Macrometastasis: (> 2.0 mm): N/A Extracapsular extension: N/A Breast prognostic profile: Estrogen receptor: Not repeated, previous study demonstrated 0% positivity ([id number]). Progesterone receptor: Not repeated, previous study demonstrated 0% positivity ([id number]) Her 2 neu: Demonstrates amplificaition ([id number]) Ki-67: Not repeated, previous study demonstrate 79% proliferation rate ([id number]). Non-neoplastic breast: Neoadjuvant related tissue changes and adenosis. TNM: ypT76m, ypN0, pMX Comments: Numerous representative  sections including the 2.0 cm hemorrhagic tissue associated with X-shaped biopsy clip, the 2.0 cm ill defined lesion associated with M-shaped clip, and intervening tissue were submitted for review. Slide sections from the tissue surrounding the X-shaped and M-shaped clips demonstrate tissue changes consistent with neoadjuvant related change. There is no invasive or in situ carcinom present at either site. However, in a representative section from the intervening tissue (slide G), there is a 1 mm focus of high grade invasive ductal carcinoma present. Given the minute size of the tumor, tumor grading is limited. The tumor is present within non-marginal tissue sections and is considered to be at least 0.5 cm from the nearest margin (medial).   Note: Amendment issued for modification of synoptic table, inadvertent typographical error. The change in the synoptic table was discussed with Dr FBurr Medicoon 04/07/2015. (CRR:ecj 04/07/2015) 2. , 4-8. In parts 2 and 4, there is extensive neoadjuvant related tissue changes including abundant foamy macrophages, fibroinflammatory reaction and dystrophic calcifications. The neoadjuvant tissue changes extend into perinodal soft tissue and are either focally or broadly present at the cauterized edge of the tissue submitted. There are no definitive features of malignancy present.  RADIOGRAPHIC STUDIES:  CT Chest W Contrast 01/22/17 IMPRESSION: 1. Stable exam. No new or progressive findings. No evidence for metastatic disease. 2. Left upper lobe architectural distortion/scarring is stable. 3. Nonobstructing right renal stone.  ECHO 12/12/16 Study Conclusions - Left ventricle: The cavity size was normal. Wall thickness was   normal. Systolic function was normal. The estimated ejection   fraction was in the range of 55% to 60%. Wall motion was normal;   there were no regional wall motion abnormalities. Doppler   parameters are consistent with abnormal left  ventricular   relaxation (grade 1 diastolic dysfunction). Impressions: - Normal LV systolic function; mild diastolic dysfunction; trace MR   and TR; global longitudinal strain -19.7%.   ASSESSMENT & PLAN:  46y.o.  African-American female, premenopausal, without significant past medical history,  presented with palpable right breast mass and right axillary mass.   1. 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 - I previously reviewed her breasts surgical pathology results with her in great detail ,  She has had fantastic partial response (near complete response) form 8 months of chemotherapy and antibody therapy.   The initial 6.3 cm breast mass has only 1 mm residual tumor,  And the previously 4.4 cm right axillary lymph node has had complete  Pathological response, all 7 nodes were negative for cancer. -I reviewed her restaging CT scan from 01/22/2017, which showed no evidence of disease, the scar tissue in left upper lobe lung is unchanged. No other new lesions -Continue monitoring cardiac echo every 6 months, she will follow up with Dr. BHaroldine Laws -lab previously reviewed, adequate for treatment, we'll continue Herceptin every 3 weeks, indefinitely -She previously had her mammogram done in December 2017, which was unremarkable  -she is clinically doing very well, no clinical suspicion for recurrence, NED on scan  -repeat echocardiogram in 2-3 months.  -repeat scan at the end of this year. I will order this at next visit.  2. Chronic sinusitis  -She has previously had recurrent sinusitis, and bronchitis. Her symptom has resolved lately. -Her symptoms have returned due to allergy season  3. Mild peripheral neuropathy, G1 -Probably related to her prior  chemotherapy -previously very mild, we'll continue monitoring.   4. Right sided chest pain -She has previously been having intermittent right-sided posterior low chest pain, radiates to her lower back. -She has some abnormal uptake on her bone scan in July 2017, possible old fracture. -She has a kidney stone, which has been present since 2015, but I doubt her chest pains related to her case to him. -Her previous pain has near resolved lately   5. HTN? -No prior history of HTN, but her BP has been slightly elevated lately  -f/u with PCP -I encourage a healthy low salt diet and exercise. She agrees and is going to start walking after work, I also strongly encouraged her to lose weight. She does not want to take medication, and is motivated to follow diet and exercise  -I again encouraged her to check her BP at home or at a pharmacy. -she will follow up with Dr. BHaroldine Laws   6. Obesity  -I previously encouraged her to have healthy diet, exercise regularly, and try to lose some weight.  7. Yeast infection secondary to antibiotic regimen -Received antibiotics with PCP for sinus infection and developed yeast infection during this time. PCP has not called in antifungal medication so patient is requesting today. -I will order Diflucan 100 mg once today.  8. Ankle discomfort/stiffness -Encouraged the patient to elevate her feet when possible -We discussed the potential use of compression stockings or joint medication. -She does not feel like medication is needed at this time.  Plan  -Diflucan 100 mg ordered today for yeast infection -herceptin every 3 weeks  -F/u in 9 weeks -repeat echocardiogram in 2-3 months.  -repeat restaging scan at the end of this year. I will order this at next visit.    I spent about 20 minutes counseling the patient and her family members, total care was about 25 minutes.  This document serves as a record of services personally performed by  [name], MD. It was  created on her behalf by Arlyce Harman, a trained medical scribe. The creation of this record is based on the scribe's personal observations and the provider's statements to them. This document has been checked and approved by the attending provider.  I have reviewed the above documentation for accuracy and completeness and I agree with the above.  [name]  04/01/2017

## 2017-04-02 ENCOUNTER — Ambulatory Visit: Payer: 59 | Admitting: Hematology

## 2017-04-02 ENCOUNTER — Ambulatory Visit: Payer: 59

## 2017-04-02 ENCOUNTER — Ambulatory Visit (HOSPITAL_BASED_OUTPATIENT_CLINIC_OR_DEPARTMENT_OTHER): Payer: 59

## 2017-04-02 ENCOUNTER — Other Ambulatory Visit: Payer: 59

## 2017-04-02 VITALS — BP 142/76 | HR 80 | Temp 98.6°F | Resp 18

## 2017-04-02 DIAGNOSIS — C50911 Malignant neoplasm of unspecified site of right female breast: Secondary | ICD-10-CM | POA: Diagnosis not present

## 2017-04-02 DIAGNOSIS — Z5112 Encounter for antineoplastic immunotherapy: Secondary | ICD-10-CM

## 2017-04-02 DIAGNOSIS — C7800 Secondary malignant neoplasm of unspecified lung: Secondary | ICD-10-CM

## 2017-04-02 MED ORDER — HEPARIN SOD (PORK) LOCK FLUSH 100 UNIT/ML IV SOLN
500.0000 [IU] | Freq: Once | INTRAVENOUS | Status: AC | PRN
Start: 2017-04-02 — End: 2017-04-02
  Administered 2017-04-02: 500 [IU]
  Filled 2017-04-02: qty 5

## 2017-04-02 MED ORDER — SODIUM CHLORIDE 0.9 % IV SOLN
Freq: Once | INTRAVENOUS | Status: AC
  Administered 2017-04-02: 16:00:00 via INTRAVENOUS

## 2017-04-02 MED ORDER — ACETAMINOPHEN 325 MG PO TABS: 650.0000 mg | ORAL_TABLET | Freq: Once | ORAL | Status: DC

## 2017-04-02 MED ORDER — TRASTUZUMAB CHEMO 150 MG IV SOLR
600.0000 mg | Freq: Once | INTRAVENOUS | Status: AC
  Administered 2017-04-02: 600 mg via INTRAVENOUS
  Filled 2017-04-02: qty 28.57

## 2017-04-02 MED ORDER — SODIUM CHLORIDE 0.9 % IJ SOLN
10.0000 mL | INTRAMUSCULAR | Status: DC | PRN
  Administered 2017-04-02: 10 mL
  Filled 2017-04-02: qty 10

## 2017-04-02 NOTE — Patient Instructions (Signed)
[facility] Discharge Instructions for Patients Receiving Chemotherapy  Today you received the following chemotherapy agents: Herceptin   To help prevent nausea and vomiting after your treatment, we encourage you to take your nausea medication as directed.    If you develop nausea and vomiting that is not controlled by your nausea medication, call the clinic.   BELOW ARE SYMPTOMS THAT SHOULD BE REPORTED IMMEDIATELY:  *FEVER GREATER THAN 100.5 F  *CHILLS WITH OR WITHOUT FEVER  NAUSEA AND VOMITING THAT IS NOT CONTROLLED WITH YOUR NAUSEA MEDICATION  *UNUSUAL SHORTNESS OF BREATH  *UNUSUAL BRUISING OR BLEEDING  TENDERNESS IN MOUTH AND THROAT WITH OR WITHOUT PRESENCE OF ULCERS  *URINARY PROBLEMS  *BOWEL PROBLEMS  UNUSUAL RASH Items with * indicate a potential emergency and should be followed up as soon as possible.  Feel free to call the clinic you have any questions or concerns. The clinic phone number is [phone].  Please show the CHEMO ALERT CARD at check-in to the Emergency Department and triage nurse.   

## 2017-04-04 ENCOUNTER — Ambulatory Visit: Payer: 59

## 2017-04-04 ENCOUNTER — Ambulatory Visit: Payer: 59 | Admitting: Hematology

## 2017-04-04 ENCOUNTER — Other Ambulatory Visit: Payer: 59

## 2017-04-06 ENCOUNTER — Encounter: Payer: Self-pay | Admitting: Hematology

## 2017-04-07 ENCOUNTER — Emergency Department (HOSPITAL_BASED_OUTPATIENT_CLINIC_OR_DEPARTMENT_OTHER): Payer: 59

## 2017-04-07 ENCOUNTER — Encounter (HOSPITAL_BASED_OUTPATIENT_CLINIC_OR_DEPARTMENT_OTHER): Payer: Self-pay | Admitting: Emergency Medicine

## 2017-04-07 ENCOUNTER — Emergency Department (HOSPITAL_BASED_OUTPATIENT_CLINIC_OR_DEPARTMENT_OTHER)
Admission: EM | Admit: 2017-04-07 | Discharge: 2017-04-07 | Disposition: A | Discharge status: Home / Self Care | Payer: 59 | Attending: Emergency Medicine | Admitting: Emergency Medicine

## 2017-04-07 DIAGNOSIS — Z85118 Personal history of other malignant neoplasm of bronchus and lung: Secondary | ICD-10-CM | POA: Insufficient documentation

## 2017-04-07 DIAGNOSIS — Z853 Personal history of malignant neoplasm of breast: Secondary | ICD-10-CM | POA: Diagnosis not present

## 2017-04-07 DIAGNOSIS — Z79899 Other long term (current) drug therapy: Secondary | ICD-10-CM | POA: Insufficient documentation

## 2017-04-07 DIAGNOSIS — M25511 Pain in right shoulder: Secondary | ICD-10-CM | POA: Insufficient documentation

## 2017-04-07 MED ORDER — IBUPROFEN 600 MG PO TABS: 600.0000 mg | ORAL_TABLET | Freq: Four times a day (QID) | ORAL | 0 refills | Status: DC | PRN

## 2017-04-07 MED ORDER — IBUPROFEN 200 MG PO TABS
600.0000 mg | ORAL_TABLET | Freq: Once | ORAL | Status: AC
  Administered 2017-04-07: 18:00:00 600 mg via ORAL
  Filled 2017-04-07: qty 1

## 2017-04-07 NOTE — ED Notes (Addendum)
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  She is concerned because she had a lumpectomy done in August 2016.  Has difficulty raising her right arm above her head.

## 2017-04-07 NOTE — ED Triage Notes (Signed)
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 Lymph node bx 2 yrs ago RUE/axilla.

## 2017-04-07 NOTE — Discharge Instructions (Signed)
Please read and follow all provided instructions.  Your diagnoses today include:  1. Acute pain of right shoulder     Tests performed today include: Vital signs. See below for your results today.   Medications prescribed:  Take as prescribed   Home care instructions:  Follow any educational materials contained in this packet.  Follow-up instructions: Please follow-up with your orthopedics for further evaluation of symptoms and treatment   Return instructions:  Please return to the Emergency Department if you do not get better, if you get worse, or new symptoms OR  - Fever (temperature greater than 101.57F)  - Bleeding that does not stop with holding pressure to the area    -Severe pain (please note that you may be more sore the day after your accident)  - Chest Pain  - Difficulty breathing  - Severe nausea or vomiting  - Inability to tolerate food and liquids  - Passing out  - Skin becoming red around your wounds  - Change in mental status (confusion or lethargy)  - New numbness or weakness    Please return if you have any other emergent concerns.  Additional Information:  Your vital signs today were: BP (!) 150/92    Pulse 82    Temp 98.8 F (37.1 C) (Oral)    Resp 16    Ht 5\' 10"  (1.778 m)    Wt 105.7 kg (233 lb)    SpO2 99%    Breastfeeding? Unknown    BMI 33.43 kg/m  If your blood pressure (BP) was elevated above 135/85 this visit, please have this repeated by your doctor within one month. ---------------

## 2017-04-07 NOTE — ED Provider Notes (Signed)
[facility] DEPT [facility] Provider Note   CSN: [id number] Arrival date & time: 04/07/17  1403     History   Chief Complaint Chief Complaint  Patient presents with  . Shoulder Pain    HPI [name] [name] is a 46 y.o. female.  HPI  46 y.o. 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 Notes worsening pain that evening and into the morning. Worse with ROM. Improved with rest. Notes pain from shoulder joint to fingertips with movement. Rates pain 8/10. No meds PTA. No fevers. No CP/SOB. No numbness/tingling. No other symptoms noted.   Past Medical History:  Diagnosis Date  . Breast cancer ([place])   . Breast cancer ([place])   . GERD (gastroesophageal reflux disease)    during pregnancy   . Pneumonia    hx of pneumonia 08/2013   . S/P radiation therapy 05/03/2015 through 06/14/2015    Right breast 4680 cGy in 26 sessions, right breast boost 1000 cGy in 5 sessions. Right supraclavicular/axillary region 4680 cGy with a supplemental PA field to bring the axillary dose up to 4500 cGy in 26 sessions    Patient Active Problem List   Diagnosis Date Noted  . Sinusitis 07/03/2016  . Candidiasis of breast 04/05/2016  . Eczema 04/05/2016  . Port catheter in place 12/06/2015  . Seasonal allergies 10/04/2015  . 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 Breast cancer 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 ([place]) 07/22/2014  . Family history of breast cancer 07/22/2014  . 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 BREAST LUMPECTOMY WITH RADIOACTIVE SEED AND SENTINEL LYMPH NODE BIOPSY Right 03/29/2015   Procedure: BREAST LUMPECTOMY WITH RADIOACTIVE SEED AND SENTINEL LYMPH NODE BIOPSY;  Surgeon: Stark Klein, MD;  Location: [facility];  Service: General;   Laterality: Right;  . CESAREAN SECTION    . CHOLECYSTECTOMY    . ESSURE TUBAL LIGATION    . PORTACATH PLACEMENT Left 07/21/2014   Procedure: INSERTION PORT-A-CATH;  Surgeon: Stark Klein, MD;  [facility];  Service: General;  Laterality: Left;  . WISDOM TOOTH EXTRACTION      OB History    Gravida Para Term Preterm AB Living   3 2           SAB TAB Ectopic Multiple Live Births                  Obstetric Comments   Menarche age 54, BC x 62, G2, P2, last menstrual cycle 08/15/14       Home Medications    Prior to Admission medications   Medication Sig Start Date End Date Taking? Authorizing Provider  albuterol (PROVENTIL HFA;VENTOLIN HFA) 108 (90 Base) MCG/ACT inhaler Inhale 2 puffs into the lungs every 6 (six) hours as needed for wheezing or shortness of breath. Patient not taking: Reported on 09/04/2016 09/13/15   [name], MD  Calcium-Phosphorus-Vitamin D (CALCIUM GUMMIES PO) Take by mouth daily.    [provider]  Camphor-Eucalyptus-Menthol (VICKS VAPORUB EX) Apply 1 application topically at bedtime. Applies under nose, throat and on chest.    [provider]  cetirizine (ZYRTEC) 10 MG tablet Take 10 mg by mouth daily as needed for allergies (allergies).     [provider]  fexofenadine (ALLEGRA) 180 MG tablet Take 180 mg by mouth daily as needed. 12/13/15  [provider]  fluconazole (DIFLUCAN) 100 MG tablet Take 1 tablet (100 mg total) by mouth daily. 04/01/17   [name], MD  fluticasone Asencion Islam) 50 MCG/ACT nasal spray  08/03/15   [provider]  hydrocortisone 2.5 % cream APPLY TOPICALLY TO THE AFFECTED AREA TWICE DAILY Patient not taking: Reported on 09/04/2016 12/07/14   [name], MD  lidocaine-prilocaine (EMLA) cream APPLY TOPICALLY TO THE AFFECTED AREA AS NEEDED 01/23/17   [name], MD  loperamide (IMODIUM A-D) 2 MG tablet Take 1 tablet (2 mg total) by mouth 4 (four) times daily as needed for diarrhea or loose stools. 09/21/14    [name], MD  LORazepam (ATIVAN) 0.5 MG tablet Take 1 tablet (0.5 mg total) by mouth every 8 (eight) hours. Patient not taking: Reported on 10/16/2016 05/22/16   [name], MD  metaxalone (SKELAXIN) 800 MG tablet Take 1 tablet (800 mg total) by mouth 3 (three) times daily. 12/27/15   [name], MD  Multiple Vitamins-Minerals (AIRBORNE PO) Take 1 tablet by mouth daily.    [provider]  Multiple Vitamins-Minerals (MULTIVITAMIN GUMMIES ADULT PO) Take 1 each by mouth daily. Women's Vitafusion gummie    [provider]  nystatin (NYSTATIN) powder Apply topically 2 (two) times daily. 04/10/16   [name], MD  triamcinolone (KENALOG) 0.025 % ointment Apply 1 application topically 2 (two) times daily. 07/03/16   [name], MD    Family History Family History  Problem Relation Age of Onset  . Breast cancer Mother 68  . Liver cancer Father   . Breast cancer Maternal Aunt 38  . Breast cancer Maternal Aunt 54  . Prostate cancer Maternal Grandfather 73  . Liver cancer Paternal Grandmother   . Prostate cancer Paternal Grandfather   . Prostate cancer Maternal Uncle 73  . Prostate cancer Maternal Uncle 68  . Breast cancer Cousin 89       mat first cousin    Social History Social History  Substance Use Topics  . Smoking status: Never Smoker  . Smokeless tobacco: Never Used  . Alcohol use Yes     Comment: 2-3 drinks per week      Allergies   Adhesive [tape]; Aspirin; and Codeine   Review of Systems Review of Systems ROS reviewed and all are negative for acute change except as noted in the HPI.  Physical Exam Updated Vital Signs BP (!) 150/92   Pulse 82   Temp 98.8 F (37.1 C) (Oral)   Resp 16   Ht 5\' 10"  (1.778 m)   Wt 105.7 kg (233 lb)   SpO2 99%   Breastfeeding? Unknown   BMI 33.43 kg/m   Physical Exam  Constitutional: She is oriented to person, place, and time. Vital signs are normal. She appears well-developed and well-nourished.  HENT:  Head:  Normocephalic.  Right Ear: Hearing normal.  Left Ear: Hearing normal.  Eyes: Pupils are equal, round, and reactive to light. Conjunctivae and EOM are normal.  Cardiovascular: Normal rate and regular rhythm.   Pulmonary/Chest: Effort normal.  Musculoskeletal:  Right Shoulder Pain with ROM both active/passive. Strength intact. NVI. Distal pulses appreciated. TTP along shoulder joint line without obvious swelling or deformities   Neurological: She is alert and oriented to person, place, and time.  Skin: Skin is warm and dry.  Psychiatric: She has a normal mood and affect. Her speech is normal and behavior is normal. Thought content normal.  Nursing note and vitals reviewed.  ED Treatments / Results  Labs (all labs ordered are listed, but only abnormal results are displayed) Labs Reviewed - No data to display  EKG  EKG Interpretation None       Radiology Dg Shoulder Right  Result Date: 04/07/2017 CLINICAL DATA:  Shoulder pain common no known injury, initial encounter EXAM: RIGHT SHOULDER - 2+ VIEW COMPARISON:  None. FINDINGS: Postsurgical changes are noted. No acute fracture or dislocation is noted. The underlying bony thorax is within normal limits. IMPRESSION: No acute abnormality noted. Electronically Signed   By: Inez Catalina M.D.   On: 04/07/2017 18:16    Procedures Procedures (including critical care time)  Medications Ordered in ED Medications - No data to display   Initial Impression / Assessment and Plan / ED Course  I have reviewed the triage vital signs and the nursing notes.  Pertinent labs & imaging results that were available during my care of the patient were reviewed by me and considered in my medical decision making (see chart for details).  Final Clinical Impressions(s) / ED Diagnoses   {I have reviewed and evaluated the relevant imaging studies.  {I have reviewed the relevant previous healthcare records.  {I obtained HPI from historian.   ED  Course:  Assessment: Patient X-Ray negative for obvious fracture or dislocation. Suspect impingement vs rotator cuff injury. Pt advised to follow up with orthopedics. Patient given sling while in ED, conservative therapy recommended and discussed. Patient will be discharged home & is agreeable with above plan. Returns precautions discussed. Pt appears safe for discharge.  Disposition/Plan:  DC Home Additional Verbal discharge instructions given and discussed with patient.  Pt Instructed to f/u with ortho in the next week for evaluation and treatment of symptoms. Return precautions given Pt acknowledges and agrees with plan  Supervising Physician Little, Wenda Overland, MD  Final diagnoses:  Acute pain of right shoulder    New Prescriptions New Prescriptions   No medications on file     Shary Decamp, Hershal Coria 04/07/17 [place], Wenda Overland, MD 04/08/17 [phone]

## 2017-04-08 ENCOUNTER — Ambulatory Visit (INDEPENDENT_AMBULATORY_CARE_PROVIDER_SITE_OTHER): Payer: 59 | Admitting: Family Medicine

## 2017-04-08 ENCOUNTER — Encounter: Payer: Self-pay | Admitting: Family Medicine

## 2017-04-08 DIAGNOSIS — M79601 Pain in right arm: Secondary | ICD-10-CM | POA: Diagnosis not present

## 2017-04-08 MED ORDER — IBUPROFEN 800 MG PO TABS: 800.0000 mg | ORAL_TABLET | Freq: Three times a day (TID) | ORAL | 1 refills | Status: AC | PRN

## 2017-04-08 MED ORDER — METAXALONE 800 MG PO TABS: 800.0000 mg | ORAL_TABLET | Freq: Three times a day (TID) | ORAL | 1 refills | Status: DC | PRN

## 2017-04-08 NOTE — Patient Instructions (Signed)
You have muscle strains of your trapezius, rotator cuff, biceps, and triceps from overuse. Use sling for next couple days then as needed. Ibuprofen 800mg  three times a day with food for pain and inflammation. Skelaxin as needed for muscle spasms. Ok to take the norco you have at home if needed for severe pain at night. Wait another 3-4 days before doing the home exercises (bicep curls, hammer rotations, tricep extensions, rotator cuff theraband exercises - all 3 sets of 10 once a day). If you're struggling still at that point call me and I would try prednisone and hold the exercises. Physical therapy is another option but I doubt you'll need this. Follow up with me in 2 weeks.

## 2017-04-09 DIAGNOSIS — M79601 Pain in right arm: Secondary | ICD-10-CM | POA: Insufficient documentation

## 2017-04-09 NOTE — Progress Notes (Signed)
PCP: System, Pcp Not In  Subjective:   HPI: Patient is a 46 y.o. female here for right shoulder pain.  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 No acute injury or trauma. 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 Developed worsening pain throughout whole body, soreness and had to stop by around 9:15. Slept on the sofa and when woke up pain was localized to right shoulder, upper arm, lateral neck. Pain about 8.5/10 now and sharp. Worse with any motions of shoulder, especially sudden movements. Tried ibuprofen, icing, sling, norco. No skin changes, new numbness though has neuropathy in upper extremities due to breast cancer treatment.  Past Medical History:  Diagnosis Date  . Breast cancer ([place])   . Breast cancer ([place])   . GERD (gastroesophageal reflux disease)    during pregnancy   . Pneumonia    hx of pneumonia 08/2013   . S/P radiation therapy 05/03/2015 through 06/14/2015    Right breast 4680 cGy in 26 sessions, right breast boost 1000 cGy in 5 sessions. Right supraclavicular/axillary region 4680 cGy with a supplemental PA field to bring the axillary dose up to 4500 cGy in 26 sessions    Current Outpatient Prescriptions on File Prior to Visit  Medication Sig Dispense Refill  . albuterol (PROVENTIL HFA;VENTOLIN HFA) 108 (90 Base) MCG/ACT inhaler Inhale 2 puffs into the lungs every 6 (six) hours as needed for wheezing or shortness of breath. (Patient not taking: Reported on 09/04/2016) 1 Inhaler 2  . Calcium-Phosphorus-Vitamin D (CALCIUM GUMMIES PO) Take by mouth daily.    . Camphor-Eucalyptus-Menthol (VICKS VAPORUB EX) Apply 1 application topically at bedtime. Applies under nose, throat and on chest.    . cetirizine (ZYRTEC) 10 MG tablet Take 10 mg by mouth daily as needed for allergies (allergies).     . fexofenadine (ALLEGRA) 180 MG tablet Take 180 mg by mouth daily  as needed.  4  . fluticasone (FLONASE) 50 MCG/ACT nasal spray   1  . hydrocortisone 2.5 % cream APPLY TOPICALLY TO THE AFFECTED AREA TWICE DAILY (Patient not taking: Reported on 09/04/2016) 454 g 0  . lidocaine-prilocaine (EMLA) cream APPLY TOPICALLY TO THE AFFECTED AREA AS NEEDED 30 g 2  . loperamide (IMODIUM A-D) 2 MG tablet Take 1 tablet (2 mg total) by mouth 4 (four) times daily as needed for diarrhea or loose stools. 30 tablet 0  . LORazepam (ATIVAN) 0.5 MG tablet Take 1 tablet (0.5 mg total) by mouth every 8 (eight) hours. (Patient not taking: Reported on 10/16/2016) 3 tablet 0  . Multiple Vitamins-Minerals (AIRBORNE PO) Take 1 tablet by mouth daily.    . Multiple Vitamins-Minerals (MULTIVITAMIN GUMMIES ADULT PO) Take 1 each by mouth daily. Women's Vitafusion gummie    . nystatin (NYSTATIN) powder Apply topically 2 (two) times daily. 15 g 2  . triamcinolone (KENALOG) 0.025 % ointment Apply 1 application topically 2 (two) times daily. 30 g 1   Current Facility-Administered Medications on File Prior to Visit  Medication Dose Route Frequency Provider Last Rate Last Dose  . acetaminophen (TYLENOL) tablet 650 mg  650 mg Oral Once [name], MD      . sodium chloride 0.9 % injection 10 mL  10 mL Intracatheter PRN [name], MD   10 mL at 05/01/16 1622    Past Surgical History:  Procedure Laterality Date  . BREAST LUMPECTOMY WITH RADIOACTIVE SEED AND SENTINEL LYMPH NODE BIOPSY Right 03/29/2015  Procedure: BREAST LUMPECTOMY WITH RADIOACTIVE SEED AND SENTINEL LYMPH NODE BIOPSY;  Surgeon: Stark Klein, MD;  Location: [facility];  Service: General;  Laterality: Right;  . CESAREAN SECTION    . CHOLECYSTECTOMY    . ESSURE TUBAL LIGATION    . PORTACATH PLACEMENT Left 07/21/2014   Procedure: INSERTION PORT-A-CATH;  Surgeon: Stark Klein, MD;  [facility];  Service: General;  Laterality: Left;  . WISDOM TOOTH EXTRACTION      Allergies  Allergen Reactions  . Adhesive [Tape] Rash  and Other (See Comments)    "tape burn"  . Aspirin Nausea Only    Upsets stomach  . Codeine Other (See Comments)    Makes loopy     Social History   Social History  . Marital status: Married    Spouse name: N/A  . Number of children: N/A  . Years of education: N/A   Occupational History  . Not on file.   Social History Main Topics  . Smoking status: Never Smoker  . Smokeless tobacco: Never Used  . Alcohol use Yes     Comment: 2-3 drinks per week   . Drug use: No  . Sexual activity: No   Other Topics Concern  . Not on file   Social History Narrative  . No narrative on file    Family History  Problem Relation Age of Onset  . Breast cancer Mother 10  . Liver cancer Father   . Breast cancer Maternal Aunt 36  . Breast cancer Maternal Aunt 32  . Prostate cancer Maternal Grandfather 48  . Liver cancer Paternal Grandmother   . Prostate cancer Paternal Grandfather   . Prostate cancer Maternal Uncle 73  . Prostate cancer Maternal Uncle 68  . Breast cancer Cousin 51       mat first cousin    BP 140/85   Pulse 76   Ht 5\' 10"  (1.778 m)   Wt 233 lb (105.7 kg)   BMI 33.43 kg/m   Review of Systems: See HPI above.     Objective:  Physical Exam:  Gen: NAD, comfortable in exam room  Neck: No gross deformity, swelling, bruising. TTP right trapezius.  No midline/bony TTP. FROM without pain. BUE strength 5/5 except rotator cuff noted below.   Sensation intact to light touch.   2+ equal reflexes in triceps, biceps, brachioradialis tendons. NV intact distal BUEs.  Right shoulder: No swelling, ecchymoses.  No gross deformity. TTP biceps, triceps.  No other tenderness. Full IR and ER.  Abduction and flexion limited to 120 degrees, painful. Positive Hawkins, Neers. Negative Yergasons. Strength 5-/5 with empty can and ER, 5/5 with resisted internal rotation. NV intact distally.  Left shoulder: FROM without pain.   Assessment & Plan:  1. Right arm/shoulder  pain - no acute injury.  Consistent with overuse strains of trapezius, rotator cuff, biceps, triceps.  Use sling for a couple more days then as needed.  Ibuprofen with skelaxin as needed.  Shown home exercises to start after a few days.  F/u in 2 weeks.

## 2017-04-09 NOTE — Assessment & Plan Note (Signed)
no acute injury.  Consistent with overuse strains of trapezius, rotator cuff, biceps, triceps.  Use sling for a couple more days then as needed.  Ibuprofen with skelaxin as needed.  Shown home exercises to start after a few days.  F/u in 2 weeks.

## 2017-04-22 ENCOUNTER — Ambulatory Visit (INDEPENDENT_AMBULATORY_CARE_PROVIDER_SITE_OTHER): Payer: 59 | Admitting: Family Medicine

## 2017-04-22 ENCOUNTER — Encounter: Payer: Self-pay | Admitting: Family Medicine

## 2017-04-22 DIAGNOSIS — M79601 Pain in right arm: Secondary | ICD-10-CM | POA: Diagnosis not present

## 2017-04-22 NOTE — Progress Notes (Signed)
PCP: System, Pcp Not In  Subjective:   HPI: Patient is a 46 y.o. female here for right shoulder pain.  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 No acute injury or trauma. 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 Developed worsening pain throughout whole body, soreness and had to stop by around 9:15. Slept on the sofa and when woke up pain was localized to right shoulder, upper arm, lateral neck. Pain about 8.5/10 now and sharp. Worse with any motions of shoulder, especially sudden movements. Tried ibuprofen, icing, sling, norco. No skin changes, new numbness though has neuropathy in upper extremities due to breast cancer treatment.  9/10: Patient reports she continues to improve compared to last visit. No longer using sling. Pain is 4/10 and achy around her right shoulder. Last Thursday was very painful without inciting event or increase in activity. She took ibuprofen and skelaxin then and takes it as needed. No skin changes, numbness, radiation of pain.  Past Medical History:  Diagnosis Date  . Breast cancer ([place])   . Breast cancer ([place])   . GERD (gastroesophageal reflux disease)    during pregnancy   . Pneumonia    hx of pneumonia 08/2013   . S/P radiation therapy 05/03/2015 through 06/14/2015    Right breast 4680 cGy in 26 sessions, right breast boost 1000 cGy in 5 sessions. Right supraclavicular/axillary region 4680 cGy with a supplemental PA field to bring the axillary dose up to 4500 cGy in 26 sessions    Current Outpatient Prescriptions on File Prior to Visit  Medication Sig Dispense Refill  . albuterol (PROVENTIL HFA;VENTOLIN HFA) 108 (90 Base) MCG/ACT inhaler Inhale 2 puffs into the lungs every 6 (six) hours as needed for wheezing or shortness of breath. (Patient not taking: Reported on 09/04/2016) 1 Inhaler 2  . Calcium-Phosphorus-Vitamin D  (CALCIUM GUMMIES PO) Take by mouth daily.    . Camphor-Eucalyptus-Menthol (VICKS VAPORUB EX) Apply 1 application topically at bedtime. Applies under nose, throat and on chest.    . cetirizine (ZYRTEC) 10 MG tablet Take 10 mg by mouth daily as needed for allergies (allergies).     . fexofenadine (ALLEGRA) 180 MG tablet Take 180 mg by mouth daily as needed.  4  . fluticasone (FLONASE) 50 MCG/ACT nasal spray   1  . hydrocortisone 2.5 % cream APPLY TOPICALLY TO THE AFFECTED AREA TWICE DAILY (Patient not taking: Reported on 09/04/2016) 454 g 0  . ibuprofen (ADVIL,MOTRIN) 800 MG tablet Take 1 tablet (800 mg total) by mouth every 8 (eight) hours as needed. 60 tablet 1  . lidocaine-prilocaine (EMLA) cream APPLY TOPICALLY TO THE AFFECTED AREA AS NEEDED 30 g 2  . loperamide (IMODIUM A-D) 2 MG tablet Take 1 tablet (2 mg total) by mouth 4 (four) times daily as needed for diarrhea or loose stools. 30 tablet 0  . LORazepam (ATIVAN) 0.5 MG tablet Take 1 tablet (0.5 mg total) by mouth every 8 (eight) hours. (Patient not taking: Reported on 10/16/2016) 3 tablet 0  . metaxalone (SKELAXIN) 800 MG tablet Take 1 tablet (800 mg total) by mouth 3 (three) times daily as needed for muscle spasms. 60 tablet 1  . Multiple Vitamins-Minerals (AIRBORNE PO) Take 1 tablet by mouth daily.    . Multiple Vitamins-Minerals (MULTIVITAMIN GUMMIES ADULT PO) Take 1 each by mouth daily. Women's Vitafusion gummie    . nystatin (NYSTATIN) powder Apply topically 2 (two) times daily. 15 g 2  .  triamcinolone (KENALOG) 0.025 % ointment Apply 1 application topically 2 (two) times daily. 30 g 1   Current Facility-Administered Medications on File Prior to Visit  Medication Dose Route Frequency Provider Last Rate Last Dose  . acetaminophen (TYLENOL) tablet 650 mg  650 mg Oral Once [name], MD      . sodium chloride 0.9 % injection 10 mL  10 mL Intracatheter PRN [name], MD   10 mL at 05/01/16 1622    Past Surgical History:  Procedure  Laterality Date  . BREAST LUMPECTOMY WITH RADIOACTIVE SEED AND SENTINEL LYMPH NODE BIOPSY Right 03/29/2015   Procedure: BREAST LUMPECTOMY WITH RADIOACTIVE SEED AND SENTINEL LYMPH NODE BIOPSY;  Surgeon: Stark Klein, MD;  Location: [facility];  Service: General;  Laterality: Right;  . CESAREAN SECTION    . CHOLECYSTECTOMY    . ESSURE TUBAL LIGATION    . PORTACATH PLACEMENT Left 07/21/2014   Procedure: INSERTION PORT-A-CATH;  Surgeon: Stark Klein, MD;  [facility];  Service: General;  Laterality: Left;  . WISDOM TOOTH EXTRACTION      Allergies  Allergen Reactions  . Adhesive [Tape] Rash and Other (See Comments)    "tape burn"  . Aspirin Nausea Only    Upsets stomach  . Codeine Other (See Comments)    Makes loopy     Social History   Social History  . Marital status: Married    Spouse name: N/A  . Number of children: N/A  . Years of education: N/A   Occupational History  . Not on file.   Social History Main Topics  . Smoking status: Never Smoker  . Smokeless tobacco: Never Used  . Alcohol use Yes     Comment: 2-3 drinks per week   . Drug use: No  . Sexual activity: No   Other Topics Concern  . Not on file   Social History Narrative  . No narrative on file    Family History  Problem Relation Age of Onset  . Breast cancer Mother 42  . Liver cancer Father   . Breast cancer Maternal Aunt 57  . Breast cancer Maternal Aunt 69  . Prostate cancer Maternal Grandfather 73  . Liver cancer Paternal Grandmother   . Prostate cancer Paternal Grandfather   . Prostate cancer Maternal Uncle 73  . Prostate cancer Maternal Uncle 68  . Breast cancer Cousin 31       mat first cousin    BP 139/82   Pulse 68   Ht 5\' 10"  (1.778 m)   Wt 233 lb (105.7 kg)   BMI 33.43 kg/m   Review of Systems: See HPI above.     Objective:  Physical Exam:  Gen: NAD, comfortable in exam room  Neck: No gross deformity, swelling, bruising. Mild TTP lateral right  trapezius.  No midline/bony TTP. FROM without pain. BUE strength 5/5.   Sensation intact to light touch.   NV intact distal BUEs.  Right shoulder: No swelling, ecchymoses.  No gross deformity. Minimal TTP biceps, triceps.  No other tenderness. Full IR and ER.  Full abduction and flexion now with painful arc. Positive Hawkins, negative Neers. Negative Yergasons. Strength 5/5 with empty can, ER, IR with mild pain. NV intact distally.  Left shoulder: FROM without pain.   Assessment & Plan:  1. Right arm/shoulder pain - no acute injury.  Consistent with overuse strains of trapezius, rotator cuff, biceps, triceps.  Clinically improving with rest, ibuprofen, skelaxin.  Add strengthening exercises which  were reviewed today.  Ibuprofen and skelaxin if needed.  F/u in 4 weeks.

## 2017-04-22 NOTE — Patient Instructions (Signed)
You have muscle strains of your trapezius, rotator cuff, biceps, and triceps from overuse. Take ibuprofen and/or skelaxin only if needed. Do home exercises most days of the week and work your way up to 3 sets of 10 once a day (At this point it's really just the rotator cuff exercises - your bicep, tricep, forearm muscles have improved). Physical therapy is another option but I doubt you'll need this. Follow up with me in 4 weeks (or as needed if you're doing well).

## 2017-04-22 NOTE — Assessment & Plan Note (Signed)
no acute injury.  Consistent with overuse strains of trapezius, rotator cuff, biceps, triceps.  Clinically improving with rest, ibuprofen, skelaxin.  Add strengthening exercises which were reviewed today.  Ibuprofen and skelaxin if needed.  F/u in 4 weeks.

## 2017-04-23 ENCOUNTER — Ambulatory Visit (HOSPITAL_BASED_OUTPATIENT_CLINIC_OR_DEPARTMENT_OTHER): Payer: 59

## 2017-04-23 VITALS — BP 153/67 | HR 73 | Temp 99.1°F | Resp 18

## 2017-04-23 DIAGNOSIS — C7800 Secondary malignant neoplasm of unspecified lung: Secondary | ICD-10-CM

## 2017-04-23 DIAGNOSIS — Z5112 Encounter for antineoplastic immunotherapy: Secondary | ICD-10-CM | POA: Diagnosis not present

## 2017-04-23 DIAGNOSIS — C50911 Malignant neoplasm of unspecified site of right female breast: Secondary | ICD-10-CM

## 2017-04-23 MED ORDER — HEPARIN SOD (PORK) LOCK FLUSH 100 UNIT/ML IV SOLN
500.0000 [IU] | Freq: Once | INTRAVENOUS | Status: AC | PRN
  Administered 2017-04-23: 500 [IU]
  Filled 2017-04-23: qty 5

## 2017-04-23 MED ORDER — SODIUM CHLORIDE 0.9 % IV SOLN: Freq: Once | INTRAVENOUS | Status: DC

## 2017-04-23 MED ORDER — SODIUM CHLORIDE 0.9 % IJ SOLN
10.0000 mL | INTRAMUSCULAR | Status: DC | PRN
  Administered 2017-04-23: 10 mL
  Filled 2017-04-23: qty 10

## 2017-04-23 MED ORDER — TRASTUZUMAB CHEMO 150 MG IV SOLR
600.0000 mg | Freq: Once | INTRAVENOUS | Status: AC
  Administered 2017-04-23: 600 mg via INTRAVENOUS
  Filled 2017-04-23: qty 28.57

## 2017-04-23 NOTE — Progress Notes (Signed)
Patient states that she is due for followup for ECHO and Dr Missy Sabins for Sept/October.  She states that she has ECHO every 6 months

## 2017-04-25 ENCOUNTER — Telehealth (HOSPITAL_COMMUNITY): Payer: Self-pay | Admitting: Vascular Surgery

## 2017-04-25 NOTE — Telephone Encounter (Signed)
Spoke to pt about making f/u apt w. Db w. Echo in [place], pt has to check her schedule she will call back

## 2017-05-14 ENCOUNTER — Telehealth: Payer: Self-pay | Admitting: *Deleted

## 2017-05-14 ENCOUNTER — Other Ambulatory Visit: Payer: 59

## 2017-05-14 ENCOUNTER — Other Ambulatory Visit (HOSPITAL_BASED_OUTPATIENT_CLINIC_OR_DEPARTMENT_OTHER): Payer: 59

## 2017-05-14 ENCOUNTER — Ambulatory Visit (HOSPITAL_BASED_OUTPATIENT_CLINIC_OR_DEPARTMENT_OTHER): Payer: 59

## 2017-05-14 ENCOUNTER — Ambulatory Visit: Payer: 59

## 2017-05-14 VITALS — BP 134/82 | HR 81 | Temp 98.8°F | Resp 16

## 2017-05-14 DIAGNOSIS — C7800 Secondary malignant neoplasm of unspecified lung: Secondary | ICD-10-CM | POA: Diagnosis not present

## 2017-05-14 DIAGNOSIS — C50911 Malignant neoplasm of unspecified site of right female breast: Secondary | ICD-10-CM | POA: Diagnosis not present

## 2017-05-14 DIAGNOSIS — Z5112 Encounter for antineoplastic immunotherapy: Secondary | ICD-10-CM | POA: Diagnosis not present

## 2017-05-14 LAB — CBC WITH DIFFERENTIAL/PLATELET
BASO%: 0.3 % (ref 0.0–2.0)
BASOS ABS: 0 10*3/uL (ref 0.0–0.1)
EOS ABS: 0.1 10*3/uL (ref 0.0–0.5)
EOS%: 1.7 % (ref 0.0–7.0)
HCT: 36 % (ref 34.8–46.6)
HGB: 11.8 g/dL (ref 11.6–15.9)
LYMPH#: 1.9 10*3/uL (ref 0.9–3.3)
LYMPH%: 27.7 % (ref 14.0–49.7)
MCH: 29.3 pg (ref 25.1–34.0)
MCHC: 32.8 g/dL (ref 31.5–36.0)
MCV: 89.3 fL (ref 79.5–101.0)
MONO#: 0.4 10*3/uL (ref 0.1–0.9)
MONO%: 5.6 % (ref 0.0–14.0)
NEUT%: 64.7 % (ref 38.4–76.8)
NEUTROS ABS: 4.5 10*3/uL (ref 1.5–6.5)
PLATELETS: 206 10*3/uL (ref 145–400)
RBC: 4.03 10*6/uL (ref 3.70–5.45)
RDW: 13.7 % (ref 11.2–14.5)
WBC: 7 10*3/uL (ref 3.9–10.3)

## 2017-05-14 LAB — COMPREHENSIVE METABOLIC PANEL
ALT: 22 U/L (ref 0–55)
AST: 27 U/L (ref 5–34)
Albumin: 3.7 g/dL (ref 3.5–5.0)
Alkaline Phosphatase: 71 U/L (ref 40–150)
Anion Gap: 11 mEq/L (ref 3–11)
BILIRUBIN TOTAL: 0.34 mg/dL (ref 0.20–1.20)
BUN: 13 mg/dL (ref 7.0–26.0)
CHLORIDE: 105 meq/L (ref 98–109)
CO2: 25 meq/L (ref 22–29)
CREATININE: 0.9 mg/dL (ref 0.6–1.1)
Calcium: 9.7 mg/dL (ref 8.4–10.4)
EGFR: >90 mL/min/{1.73_m2} (ref >90)
GLUCOSE: 123 mg/dL (ref 70–140)
Potassium: 3.7 mEq/L (ref 3.5–5.1)
SODIUM: 140 meq/L (ref 136–145)
TOTAL PROTEIN: 8 g/dL (ref 6.4–8.3)

## 2017-05-14 MED ORDER — SODIUM CHLORIDE 0.9 % IJ SOLN
10.0000 mL | INTRAMUSCULAR | Status: AC | PRN
  Filled 2017-05-14: qty 10

## 2017-05-14 MED ORDER — HEPARIN SOD (PORK) LOCK FLUSH 100 UNIT/ML IV SOLN
500.0000 [IU] | Freq: Once | INTRAVENOUS | Status: AC | PRN
  Administered 2017-05-14: 500 [IU]
  Filled 2017-05-14: qty 5

## 2017-05-14 MED ORDER — ACETAMINOPHEN 325 MG PO TABS
ORAL_TABLET | ORAL | Status: AC
  Filled 2017-05-14: qty 2

## 2017-05-14 MED ORDER — TRASTUZUMAB CHEMO 150 MG IV SOLR
600.0000 mg | Freq: Once | INTRAVENOUS | Status: AC
  Administered 2017-05-14: 600 mg via INTRAVENOUS
  Filled 2017-05-14: qty 28.57

## 2017-05-14 MED ORDER — SODIUM CHLORIDE 0.9 % IJ SOLN
10.0000 mL | INTRAMUSCULAR | Status: DC | PRN
  Administered 2017-05-14: 10 mL
  Filled 2017-05-14: qty 10

## 2017-05-14 MED ORDER — ACETAMINOPHEN 325 MG PO TABS: 650.0000 mg | ORAL_TABLET | Freq: Once | ORAL | Status: DC

## 2017-05-14 MED ORDER — SODIUM CHLORIDE 0.9 % IV SOLN
Freq: Once | INTRAVENOUS | Status: AC
  Administered 2017-05-14: 16:00:00 via INTRAVENOUS

## 2017-05-14 NOTE — Telephone Encounter (Signed)
Patient reported to infusion room for herceptin infusion. Patient states she has eczema on her hands. She has medication to control flare ups however, this time it has been worse and the creams are not helping. Dr. Burr Medico has been managing this condition. She is very concerned since the last time her flare was this intense her breast cancer was found. She would like some further guidance to help the eczema. Her hands are raw and sore.

## 2017-05-14 NOTE — Telephone Encounter (Signed)
Myrtle, please call pt to see if she wants to see me before her next appointment in 3 weeks, if not, please call in kenalog cream twice daily, thanks  [name] MD

## 2017-05-14 NOTE — Patient Instructions (Signed)
[facility] Discharge Instructions for Patients Receiving Chemotherapy  Today you received the following chemotherapy agents Herceptin  To help prevent nausea and vomiting after your treatment, we encourage you to take your nausea medication as directed   If you develop nausea and vomiting that is not controlled by your nausea medication, call the clinic.   BELOW ARE SYMPTOMS THAT SHOULD BE REPORTED IMMEDIATELY:  *FEVER GREATER THAN 100.5 F  *CHILLS WITH OR WITHOUT FEVER  NAUSEA AND VOMITING THAT IS NOT CONTROLLED WITH YOUR NAUSEA MEDICATION  *UNUSUAL SHORTNESS OF BREATH  *UNUSUAL BRUISING OR BLEEDING  TENDERNESS IN MOUTH AND THROAT WITH OR WITHOUT PRESENCE OF ULCERS  *URINARY PROBLEMS  *BOWEL PROBLEMS  UNUSUAL RASH Items with * indicate a potential emergency and should be followed up as soon as possible.  Feel free to call the clinic should you have any questions or concerns. The clinic phone number is [phone].  Please show the CHEMO ALERT CARD at check-in to the Emergency Department and triage nurse.   

## 2017-05-15 ENCOUNTER — Telehealth: Payer: Self-pay | Admitting: *Deleted

## 2017-05-15 ENCOUNTER — Other Ambulatory Visit: Payer: Self-pay | Admitting: *Deleted

## 2017-05-15 LAB — CANCER ANTIGEN 27.29: CA 27.29: 5.6 U/mL (ref 0.0–38.6)

## 2017-05-15 MED ORDER — TRIAMCINOLONE ACETONIDE 0.025 % EX OINT
1.0000 | TOPICAL_OINTMENT | Freq: Two times a day (BID) | CUTANEOUS | 1 refills | Status: DC
Start: 2017-05-15 — End: 2019-09-21

## 2017-05-15 NOTE — Telephone Encounter (Signed)
Left message to call back to discuss eczema.

## 2017-05-15 NOTE — Telephone Encounter (Signed)
Talked with pt & she doesn't feel she needs to come in but would like something for her eczema  She reports that she has used the last of her med(cream) & states that she is scratching & has had some bleeding & asked for an antibiotic. Discussed with Dr Burr Medico & will send in script & suggesed triple antibiotic oint & benadryl & anti-itch cream.  She feels that she may have some underlying infection since every time that her eczema has flared she has had something else going on.  Suggested that this may have just been coincidental.  She denies any other signs or symptoms of infection.

## 2017-06-03 NOTE — Progress Notes (Signed)
[facility] Follow up note   Patient Care Team: System, Pcp Not In as PCP - [name] [name], [name] [name] (Family Medicine) [name] Bouche, NP as Nurse Practitioner (Nurse Practitioner) [name] Klein, MD as Consulting Physician (General Surgery) [name] Koh, MD as Consulting Physician (Radiation Oncology) [name] Merle, MD as Consulting Physician (Hematology)  CHIEF COMPLAINTS Follow up metastatic breast cancer  Oncology History   Breast cancer 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   Staging form: Breast, AJCC 7th Edition     Clinical stage from 07/22/2014: Stage IV (T3, N1, M1) - Unsigned       Breast cancer 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 ([place])   07/02/2014 Mammogram    Mammogram showed a 2cm right beast mass and a 1.8cm right axillary node. MRI breast on 07/16/2014 showed 7cm R breast lesion and 4.4cm r axillary node       07/02/2014 Imaging    CT CAP: a 4.7cm mass in LUL lung and a 2.1cm mas in RML, and a small nodule in RUL, suspecious for metastasis        07/09/2014 Initial Diagnosis    right IDA with b/l lung lesions, ER-/PR-/HER2+      07/09/2014 Initial Biopsy    US guided right breast mass and axillary node biopsy showed IDA, and DCIS, ER-/PR-/HER2+      07/26/2014 Pathologic Stage    Left lung mass by IR, path revealed high grade carcinoma, morphology similar to breast tumor biopsy, TTF(-), NapsinA(-), ER(-)      08/04/2014 - 03/22/2015 Chemotherapy    weekly Paclitaxel 44m/m2, trastuzumab and pertuzumab every 3 weeks      08/30/2014 Genetic Testing    BreastNext panel was negative. 17 genes including BRCA1, BRCA2, were negative for mutations.       10/04/2014 Imaging    Interval decrease in the right axillary lymphadenopathy. Bilateral pulmonary lesions with left hilar lymphadenopathy also markedly decreased in the interval. The left hilar lymphadenopathy has resolved.      12/20/2014 Imaging    restaging CT showed stable disease, no new  lesions       03/29/2015 Pathology Results     right breast lumpectomy showed  chemotherapy treatment effect,  a 1 mm residual tumor,   margins were widely negative, 5 sentinel lymph nodes and 2 axillary lymph nodes were negative.       03/29/2015 Surgery     right breast lumpectomy and sentinel lymph node biopsy  by [name] [name]     04/12/2015 -  Chemotherapy     Herceptin maintenance therapy , 6 mg/kg, every 3 weeks      05/03/2015 - 06/14/2015 Radiation Therapy    right breast adjuvant irradiation by Dr. MValere [name]      08/28/2016 Imaging    CT CAP w Contrast  IMPRESSION: 1. No acute process or evidence of metastatic disease within the chest, abdomen, or pelvis. 2. Similar to less well-defined left upper lobe density, likely scarring. No evidence of new or progressive pulmonary metastasis. 3. Right nephrolithiasis.      01/22/2017 Imaging    CT Chest W Contrast 01/22/17 IMPRESSION: 1. Stable exam. No new or progressive findings. No evidence for metastatic disease. 2. Left upper lobe architectural distortion/scarring is stable. 3. Nonobstructing right renal stone.       CURRENT THERAPY: Herceptin maintenance therapy every 3 weeks, started on 04/12/2015  INTERVAL HISTORY:   [name] for follow-up and for her next dose of Herceptin. The patient reports that  she was recently seen by her PCP for URI-like symptoms including a sore throat. Her strep test was negative. She states that she was given Azithromycin and antitussive syrup to treat her symptoms which has improved her symptoms. Her sore throat is now only intermittent and present with swallowing. Today is her last day of Azithromycin. She denies any fever or nasal congestion associated with this infectious etiology. She does report generalized myalgias with this. She again voices her concern with her PCP. She has not received her yearly influenza vaccination.   Additionally, since last being seen she was seen by Dr [name] [name]  of Orthopedics in August for her ongoing right shoulder pain. She was placed into a shoulder sling at that time and her pain improved completely. She denies any limited range of motion secondary to this. She denies any upper back pain or joint swelling associated with this. Otherwise, she has been without acute complaint and overall doing well since we last saw her.   MEDICAL HISTORY:  Past Medical History:  Diagnosis Date  . Breast cancer ([place])   . Breast cancer ([place])   . GERD (gastroesophageal reflux disease)    during pregnancy   . Pneumonia    hx of pneumonia 08/2013   . S/P radiation therapy 05/03/2015 through 06/14/2015    Right breast 4680 cGy in 26 sessions, right breast boost 1000 cGy in 5 sessions. Right supraclavicular/axillary region 4680 cGy with a supplemental PA field to bring the axillary dose up to 4500 cGy in 26 sessions    SURGICAL HISTORY: Past Surgical History:  Procedure Laterality Date  . BREAST LUMPECTOMY WITH RADIOACTIVE SEED AND SENTINEL LYMPH NODE BIOPSY Right 03/29/2015   Procedure: BREAST LUMPECTOMY WITH RADIOACTIVE SEED AND SENTINEL LYMPH NODE BIOPSY;  Surgeon: [name] Klein, MD;  Location: [facility];  Service: General;  Laterality: Right;  . CESAREAN SECTION    . CHOLECYSTECTOMY    . ESSURE TUBAL LIGATION    . PORTACATH PLACEMENT Left 07/21/2014   Procedure: INSERTION PORT-A-CATH;  Surgeon: [name] Klein, MD;  Location: [facility];  Service: General;  Laterality: Left;  . WISDOM TOOTH EXTRACTION      SOCIAL HISTORY: Social History   Social History  . Marital status: Married    Spouse name: N/A  . Number of children: N/A  . Years of education: N/A   Social History Main Topics  . Smoking status: Never Smoker  . Smokeless tobacco: Never Used  . Alcohol use Yes     Comment: 2-3 drinks per week   . Drug use: No  . Sexual activity: No   Other Topics Concern  . Not on file   Social  History Narrative  . No narrative on file    FAMILY HISTORY: Family History  Problem Relation Age of Onset  . Breast cancer Mother 12  . Liver cancer Father   . Breast cancer Maternal Aunt 39  . Breast cancer Maternal Aunt 72  . Prostate cancer Maternal Grandfather 16  . Liver cancer Paternal Grandmother   . Prostate cancer Paternal Grandfather   . Prostate cancer Maternal Uncle 73  . Prostate cancer Maternal Uncle 68  . Breast cancer Cousin 76       mat first cousin    GENETICS: 08/30/2014 BreastNext panel was negative. 17 genes including BRCA1, BRCA2, were negative for mutations.   ALLERGIES:  is allergic to adhesive [tape]; aspirin; and codeine.  MEDICATIONS:  Current Outpatient Prescriptions  Medication Sig Dispense Refill  .  albuterol (PROVENTIL HFA;VENTOLIN HFA) 108 (90 Base) MCG/ACT inhaler Inhale 2 puffs into the lungs every 6 (six) hours as needed for wheezing or shortness of breath. (Patient not taking: Reported on 09/04/2016) 1 Inhaler 2  . Calcium-Phosphorus-Vitamin D (CALCIUM GUMMIES PO) Take by mouth daily.    . Camphor-Eucalyptus-Menthol (VICKS VAPORUB EX) Apply 1 application topically at bedtime. Applies under nose, throat and on chest.    . cetirizine (ZYRTEC) 10 MG tablet Take 10 mg by mouth daily as needed for allergies (allergies).     . fexofenadine (ALLEGRA) 180 MG tablet Take 180 mg by mouth daily as needed.  4  . fluticasone (FLONASE) 50 MCG/ACT nasal spray   1  . hydrocortisone 2.5 % cream APPLY TOPICALLY TO THE AFFECTED AREA TWICE DAILY (Patient not taking: Reported on 09/04/2016) 454 g 0  . ibuprofen (ADVIL,MOTRIN) 800 MG tablet Take 1 tablet (800 mg total) by mouth every 8 (eight) hours as needed. 60 tablet 1  . lidocaine-prilocaine (EMLA) cream APPLY TOPICALLY TO THE AFFECTED AREA AS NEEDED 30 g 2  . loperamide (IMODIUM A-D) 2 MG tablet Take 1 tablet (2 mg total) by mouth 4 (four) times daily as needed for diarrhea or loose stools. 30 tablet 0  .  LORazepam (ATIVAN) 0.5 MG tablet Take 1 tablet (0.5 mg total) by mouth every 8 (eight) hours. (Patient not taking: Reported on 10/16/2016) 3 tablet 0  . metaxalone (SKELAXIN) 800 MG tablet Take 1 tablet (800 mg total) by mouth 3 (three) times daily as needed for muscle spasms. 60 tablet 1  . Multiple Vitamins-Minerals (AIRBORNE PO) Take 1 tablet by mouth daily.    . Multiple Vitamins-Minerals (MULTIVITAMIN GUMMIES ADULT PO) Take 1 each by mouth daily. Women's Vitafusion gummie    . nystatin (NYSTATIN) powder Apply topically 2 (two) times daily. 15 g 2  . triamcinolone (KENALOG) 0.025 % ointment Apply 1 application topically 2 (two) times daily. 30 g 1   No current facility-administered medications for this visit.    Facility-Administered Medications Ordered in Other Visits  Medication Dose Route Frequency Provider Last Rate Last Dose  . acetaminophen (TYLENOL) tablet 650 mg  650 mg Oral Once [name] Merle, MD      . sodium chloride 0.9 % injection 10 mL  10 mL Intracatheter PRN [name] Merle, MD   10 mL at 05/01/16 1622  . sodium chloride 0.9 % injection 10 mL  10 mL Intravenous PRN [name] Merle, MD        REVIEW OF SYSTEMS:   Constitutional: Denies fevers, chills or abnormal night sweats  Eyes: Denies blurriness of vision, double vision or watery eyes Ears, nose, mouth, throat, and face: Denies mucositis or sore throat Respiratory: Occasional dry cough, no dyspnea or wheezes Cardiovascular: Denies palpitation, chest discomfort or lower extremity swelling Gastrointestinal:  Denies nausea, heartburn or change in bowel habits Skin: (+) yeast infection secondary to antibiotic regimen Lymphatics: Denies new lymphadenopathy or easy bruising MSK: (+) ankle discomfort/stiffness Neurological:Denies numbness, new weaknesses (+) tingling in fingers Behavioral/Psych: Mood is stable, no new changes  All other systems were reviewed with the patient and are negative.  PHYSICAL EXAMINATION: ECOG PERFORMANCE  STATUS: 0  Vitals:   06/04/17 0847  BP: (!) 147/84  Pulse: 74  Temp: 98.7 F (37.1 C)  TempSrc: Oral  SpO2: 98%  Weight: 232 lb 14.4 oz (105.6 kg)  Height: '5\' 10"'$  (1.778 m)     GENERAL:alert, no distress and comfortable SKIN: skin color, texture, turgor are normal, no  rashes or significant lesions EYES: normal, conjunctiva are pink and non-injected, sclera clear OROPHARYNX:no exudate, no erythema and lips, buccal mucosa, and tongue normal.  NECK: supple, thyroid normal size, non-tender, without nodularity LYMPH:  no palpable lymphadenopathy in the cervical, axillary or inguinal LUNGS: clear to auscultation and percussion with normal breathing effort HEART: regular rate & rhythm and no murmurs and no lower extremity edema ABDOMEN:abdomen soft, non-tender and normal bowel sounds Musculoskeletal:no cyanosis of digits and no clubbing, no tenderness over her right shoulder or spine  PSYCH: alert & oriented x 3 with fluent speech NEURO: no focal motor/sensory deficits Breasts:  Previously, status post right breast lumpectomy and sentinel lymph node biopsy, incisions have healed well. The previous palpable seroma has resolved. (+) mild skin pigmentation of right breast secondary to radiation. Palpation of both breasts  and axillas revealed no other obvious mass that I could appreciate.  Skin: No skin rashes.   LABORATORY DATA:  I have reviewed the data as listed CBC Latest Ref Rng & Units 05/14/2017 04/01/2017 01/21/2017  WBC 3.9 - 10.3 10e3/uL 7.0 6.7 6.4  Hemoglobin 11.6 - 15.9 g/dL 11.8 12.0 12.1  Hematocrit 34.8 - 46.6 % 36.0 37.2 36.8  Platelets 145 - 400 10e3/uL 206 230 237    CMP Latest Ref Rng & Units 05/14/2017 04/01/2017 01/21/2017  Glucose 70 - 140 mg/dl 123 88 81  BUN 7.0 - 26.0 mg/dL 13.0 15.4 11.5  Creatinine 0.6 - 1.1 mg/dL 0.9 1.0 0.8  Sodium 136 - 145 mEq/L 140 140 141  Potassium 3.5 - 5.1 mEq/L 3.7 4.0 4.0  Chloride 101 - 111 mmol/L - - -  CO2 22 - 29 mEq/L _0 Calcium 8.4 - 10.4 mg/dL 9.7 9.6 9.6  Total Protein 6.4 - 8.3 g/dL 8.0 7.5 7.6  Total Bilirubin 0.20 - 1.20 mg/dL 0.34 0.32 0.28  Alkaline Phos 40 - 150 U/L 71 74 72  AST 5 - 34 U/L _1 ALT 0 - 55 U/L _2 PATHOLOGY REPORT 07/09/2014 #1 breast, right needle core biopsy more anterior -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 45)  Lung, biopsy, Left 07/26/2014 - HIGH GRADE CARCINOMA, SEE COMMENT. Microscopic Comment The carcinoma demonstrates the following immunophenotype: TTF-1 - negative expression. Napsin A- negative expression. CK5/6 - focal, moderate expression. estrogen receptor - negative expression. GCDFP- negative expression. The recent breast biopsy demonstrating Her2 amplified high grade invasive mammary carcinoma is noted ([id number]). Although the immunophenotype of the current case is non-sepcific, it strongly argues against primary lung adenocarcinoma. However, on re-review, the morphology of the current case is essentially identical to the primary mammary carcinoma. In lieu of further immunophenotyping, the tumor will be submitted for Her2 testing and the remaining tissue will be reserved for additional ancillary tumor testing. The results of the Her2 testing will be reported in an addendum. The case was discussed with Dr Burr Medico on 07/28/2015 and 07/29/2015 HER2 POSITIVE   Diagnosis 03/29/2015 1. Breast, lumpectomy, Right - INVASIVE DUCTAL CARCINOMA, SEE COMMENT. - SEE TUMOR SYNOPTIC TEMPLATE BELOW. 2. Lymph node, sentinel, biopsy, Right axillary #1 - ONE LYMPH NODE, NEGATIVE FOR TUMOR (0/1). - SEE COMMENT. 3. Lymph node, sentinel, biopsy, Right axillary #2 - BENIGN FIBROFATTY SOFT TISSUE. - NEGATIVE FOR ATYPIA OR MALIGNANCY. -  NEGATIVE  FOR LYMPH NODE. 4. Lymph node, sentinel, biopsy, Right axillary #3 - ONE LYMPH NODE, NEGATIVE FOR TUMOR (0/1). - SEE COMMENT. 5. Lymph node, sentinel, biopsy, Right axillary #4 - ONE LYMPH NODE, NEGATIVE FOR TUMOR (0/1). - SEE COMMENT. 6. Lymph node, sentinel, biopsy, Right axillary #5 - ONE LYMPH NODE, NEGATIVE FOR TUMOR (0/1). - SEE COMMENT. 7. Lymph node, sentinel, biopsy, right axillary - ONE LYMPH NODE, NEGATIVE FOR TUMOR (0/1). - SEE COMMENT. 8. Lymph node, sentinel, biopsy, right axillary - ONE LYMPH NODE, NEGATIVE FOR TUMOR (0/1). - SEE COMMENT. 1 of 4 Amended copy Amended FINAL for BRITTANEY, BEAULIEU ([id number]) Microscopic Comment 1. BREAST, INVASIVE TUMOR, WITH LYMPH NODES PRESENT Specimen, including laterality and lymph node sampling (sentinel, non-sentinel): Right breast with sentinel and non-sentinel lymph node sampling Procedure: Lumpectomy Histologic type: Ductal, see comment Grade: 2 of 3, see comment. Tubule formation: 3 Nuclear pleomorphism: 3 Mitotic: 1 Tumor size (glass slide measurement): 1 mm, see comment Margins: Invasive, distance to closest margin: See comment In-situ, distance to closest margin: N/A If margin positive, focally or broadly: N/A Lymphovascular invasion: Absent Ductal carcinoma in situ: Absent Grade: N/A Extensive intraductal component: N/A Lobular neoplasia: Absent Tumor focality: Unifocal, see comment Treatment effect: Present If present, treatment effect in breast tissue, lymph nodes or both: Both breast tissue and lymph nodes Extent of tumor: Skin: N/A Nipple: N/A Skeletal muscle: N/A Lymph nodes: Examined: 5 Sentinel 2 Non-sentinel 7 Total Lymph nodes with metastasis: 0 Isolated tumor cells (< 0.2 mm): N/A Micrometastasis: (> 0.2 mm and < 2.0 mm): N/A Macrometastasis: (> 2.0 mm): N/A Extracapsular extension: N/A Breast prognostic profile: Estrogen receptor: Not repeated, previous study demonstrated 0%  positivity ([id number]). Progesterone receptor: Not repeated, previous study demonstrated 0% positivity ([id number]) Her 2 neu: Demonstrates amplificaition ([id number]) Ki-67: Not repeated, previous study demonstrate 79% proliferation rate ([id number]). Non-neoplastic breast: Neoadjuvant related tissue changes and adenosis. TNM: ypT56m, ypN0, pMX Comments: Numerous representative sections including the 2.0 cm hemorrhagic tissue associated with X-shaped biopsy clip, the 2.0 cm ill defined lesion associated with M-shaped clip, and intervening tissue were submitted for review. Slide sections from the tissue surrounding the X-shaped and M-shaped clips demonstrate tissue changes consistent with neoadjuvant related change. There is no invasive or in situ carcinom present at either site. However, in a representative section from the intervening tissue (slide G), there is a 1 mm focus of high grade invasive ductal carcinoma present. Given the minute size of the tumor, tumor grading is limited. The tumor is present within non-marginal tissue sections and is considered to be at least 0.5 cm from the nearest margin (medial).   Note: Amendment issued for modification of synoptic table, inadvertent typographical error. The change in the synoptic table was discussed with Dr FBurr Medicoon 04/07/2015. (CRR:ecj 04/07/2015) 2. , 4-8. In parts 2 and 4, there is extensive neoadjuvant related tissue changes including abundant foamy macrophages, fibroinflammatory reaction and dystrophic calcifications. The neoadjuvant tissue changes extend into perinodal soft tissue and are either focally or broadly present at the cauterized edge of the tissue submitted. There are no definitive features of malignancy present.  RADIOGRAPHIC STUDIES:  CT Chest W Contrast 01/22/17 IMPRESSION: 1. Stable exam. No new or progressive findings. No evidence for metastatic disease. 2. Left upper lobe architectural distortion/scarring is  stable. 3. Nonobstructing right renal stone.  ECHO 12/12/16 Study Conclusions - Left ventricle: The cavity size was normal. Wall thickness was   normal. Systolic function was normal. The estimated ejection   fraction was  in the range of 55% to 60%. Wall motion was normal;   there were no regional wall motion abnormalities. Doppler   parameters are consistent with abnormal left ventricular   relaxation (grade 1 diastolic dysfunction). Impressions: - Normal LV systolic function; mild diastolic dysfunction; trace MR   and TR; global longitudinal strain -19.7%.   ASSESSMENT & PLAN:  46 y.o.  African-American female, premenopausal, without significant past medical history, presented with palpable right breast mass and right axillary mass.   1. 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 - I previously reviewed her breasts surgical pathology results with her in great detail ,  She has had fantastic partial response (near complete response) form 8 months of chemotherapy and antibody therapy.   The initial 6.3 cm breast mass has only 1 mm residual tumor,  And the previously 4.4 cm right axillary lymph node has had complete  Pathological response, all 7 nodes were negative for cancer. -I reviewed her restaging CT scan from 01/22/2017, which showed no evidence of disease, the scar tissue in left upper lobe lung is unchanged. No other new lesions -Continue monitoring cardiac echo every 6 months, she will follow up with Dr. BHaroldine Laws -She previously had her mammogram done in December 2017, which was unremarkable  -Adequate clinical presentation for treatment today, we'll continue Herceptin every 3 weeks, indefinitely.  -she is clinically doing very well, no clinical  suspicion for recurrence, NED on most recent scans.   -repeat echocardiogram next week. -repeat  CTCAP  at the end of this year. I will order this today. -I will also order bone scan for December as well. Her right shoulder pain has resolved now, unlikely bone metastasis  -We'll see her back in 9 weeks   2. Recurrent sinusitis/URI-like symptoms -She has previously had recurrent sinusitis, and bronchitis. Her symptom has resolved lately. -Her symptoms have returned due to allergy season.  -She did recently have a flare up of this, including sore throat. She was placed on Azithromycin by her PCP and her symptoms have now improved as of today (06/04/17). On exam, her throat appears well.  -I also encouraged her to rest well and drink adequate amounts of water to assist with this.   3. Mild peripheral neuropathy, G1 -Probably related to her prior chemotherapy -previously very mild, we'll continue monitoring.   4. History of right sided chest pain -She has previously been having intermittent right-sided posterior low chest pain, radiates to her lower back. -She has some abnormal uptake on her bone scan in July 2017, possible old fracture. -She has a kidney stone, which has been present since 2015, but I doubt her chest pains related to her case to him. -Her previous pain has resolved   5. HTN? -No prior history of HTN, but her BP has been slightly elevated lately  -f/u with PCP -I encourage a healthy low salt diet and exercise. She agrees and is going to start walking after work, I also strongly encouraged her to lose weight. She does not want to take medication, and is motivated to follow diet and exercise  -I previously encouraged her to check her BP at home or at a pharmacy.  -she will follow up with Dr. BHaroldine Laws   6. Obesity  -I previously encouraged her to have healthy  diet, exercise regularly, and try to lose some weight.  8. Right shoulder pain  -she developed right shoulder pain  after her daughter's foot ball game in 03/2017 -see by Dr. Barbaraann [name], likley overuse strains  -resolved now   Plan  -Flu shot next appointment -Herceptin in 3, 6 weeks (in [place] if possible) and 12/20 -Lab in 3 and 12/20 -Lab, CT CAP and bone scan on 12/18 -F/u on 12/20   I spent about 20 minutes counseling the patient and her family members, total care was about 25 minutes.  This document serves as a record of services personally performed by [name] Merle, MD. It was created on her behalf by Reola Mosher, a trained medical scribe. The creation of this record is based on the scribe's personal observations and the provider's statements to them. This document has been checked and approved by the attending provider.  I have reviewed the above documentation for accuracy and completeness and I agree with the above.  [name] [name]  06/04/2017

## 2017-06-04 ENCOUNTER — Ambulatory Visit (HOSPITAL_BASED_OUTPATIENT_CLINIC_OR_DEPARTMENT_OTHER): Payer: 59

## 2017-06-04 ENCOUNTER — Ambulatory Visit (HOSPITAL_BASED_OUTPATIENT_CLINIC_OR_DEPARTMENT_OTHER): Payer: 59 | Admitting: Hematology

## 2017-06-04 VITALS — BP 147/84 | HR 74 | Temp 98.7°F | Ht 70.0 in | Wt 232.9 lb

## 2017-06-04 DIAGNOSIS — G620 Drug-induced polyneuropathy: Secondary | ICD-10-CM

## 2017-06-04 DIAGNOSIS — E669 Obesity, unspecified: Secondary | ICD-10-CM | POA: Diagnosis not present

## 2017-06-04 DIAGNOSIS — C50911 Malignant neoplasm of unspecified site of right female breast: Secondary | ICD-10-CM | POA: Diagnosis not present

## 2017-06-04 DIAGNOSIS — Z171 Estrogen receptor negative status [ER-]: Secondary | ICD-10-CM | POA: Diagnosis not present

## 2017-06-04 DIAGNOSIS — C7802 Secondary malignant neoplasm of left lung: Secondary | ICD-10-CM

## 2017-06-04 DIAGNOSIS — Z5112 Encounter for antineoplastic immunotherapy: Secondary | ICD-10-CM | POA: Diagnosis not present

## 2017-06-04 DIAGNOSIS — C7800 Secondary malignant neoplasm of unspecified lung: Principal | ICD-10-CM

## 2017-06-04 MED ORDER — HEPARIN SOD (PORK) LOCK FLUSH 100 UNIT/ML IV SOLN
500.0000 [IU] | Freq: Once | INTRAVENOUS | Status: AC | PRN
  Administered 2017-06-04: 500 [IU]
  Filled 2017-06-04: qty 5

## 2017-06-04 MED ORDER — TRASTUZUMAB CHEMO 150 MG IV SOLR
600.0000 mg | Freq: Once | INTRAVENOUS | Status: AC
  Administered 2017-06-04: 600 mg via INTRAVENOUS
  Filled 2017-06-04: qty 28.57

## 2017-06-04 MED ORDER — SODIUM CHLORIDE 0.9 % IV SOLN
Freq: Once | INTRAVENOUS | Status: AC
  Administered 2017-06-04: 10:00:00 via INTRAVENOUS

## 2017-06-04 MED ORDER — ACETAMINOPHEN 325 MG PO TABS: 650.0000 mg | ORAL_TABLET | Freq: Once | ORAL | Status: DC

## 2017-06-04 MED ORDER — SODIUM CHLORIDE 0.9 % IJ SOLN
10.0000 mL | INTRAMUSCULAR | Status: DC | PRN
  Administered 2017-06-04: 10 mL
  Filled 2017-06-04: qty 10

## 2017-06-04 NOTE — Progress Notes (Signed)
Reviewed with MD, ok to use pt lab results from 10/2 for Herceptin treatment today.

## 2017-06-04 NOTE — Patient Instructions (Signed)
[facility] Discharge Instructions for Patients Receiving Chemotherapy  Today you received the following chemotherapy agent: Herceptin  To help prevent nausea and vomiting after your treatment, we encourage you to take your nausea medication as directed.   If you develop nausea and vomiting that is not controlled by your nausea medication, call the clinic.   BELOW ARE SYMPTOMS THAT SHOULD BE REPORTED IMMEDIATELY:  *FEVER GREATER THAN 100.5 F  *CHILLS WITH OR WITHOUT FEVER  NAUSEA AND VOMITING THAT IS NOT CONTROLLED WITH YOUR NAUSEA MEDICATION  *UNUSUAL SHORTNESS OF BREATH  *UNUSUAL BRUISING OR BLEEDING  TENDERNESS IN MOUTH AND THROAT WITH OR WITHOUT PRESENCE OF ULCERS  *URINARY PROBLEMS  *BOWEL PROBLEMS  UNUSUAL RASH Items with * indicate a potential emergency and should be followed up as soon as possible.  Feel free to call the clinic should you have any questions or concerns. The clinic phone number is [phone].  Please show the [facility] at check-in to the Emergency Department and triage nurse.

## 2017-06-05 ENCOUNTER — Encounter: Payer: Self-pay | Admitting: Hematology

## 2017-06-05 ENCOUNTER — Telehealth: Payer: Self-pay | Admitting: Hematology

## 2017-06-05 NOTE — Telephone Encounter (Signed)
Called patient regarding November and December. I will mail out a letter.

## 2017-06-20 ENCOUNTER — Ambulatory Visit (HOSPITAL_BASED_OUTPATIENT_CLINIC_OR_DEPARTMENT_OTHER)
Admission: RE | Admit: 2017-06-20 | Discharge: 2017-06-20 | Disposition: A | Discharge status: Home / Self Care | Payer: 59 | Source: Ambulatory Visit | Attending: Internal Medicine | Admitting: Internal Medicine

## 2017-06-20 ENCOUNTER — Other Ambulatory Visit: Payer: Self-pay

## 2017-06-20 ENCOUNTER — Ambulatory Visit (HOSPITAL_COMMUNITY)
Admission: RE | Admit: 2017-06-20 | Discharge: 2017-06-20 | Disposition: A | Discharge status: Home / Self Care | Payer: 59 | Source: Ambulatory Visit | Attending: Internal Medicine | Admitting: Internal Medicine

## 2017-06-20 VITALS — BP 158/98 | HR 71 | Wt 234.0 lb

## 2017-06-20 DIAGNOSIS — Z9889 Other specified postprocedural states: Secondary | ICD-10-CM | POA: Insufficient documentation

## 2017-06-20 DIAGNOSIS — Z803 Family history of malignant neoplasm of breast: Secondary | ICD-10-CM | POA: Insufficient documentation

## 2017-06-20 DIAGNOSIS — K219 Gastro-esophageal reflux disease without esophagitis: Secondary | ICD-10-CM | POA: Insufficient documentation

## 2017-06-20 DIAGNOSIS — C50912 Malignant neoplasm of unspecified site of left female breast: Secondary | ICD-10-CM | POA: Insufficient documentation

## 2017-06-20 DIAGNOSIS — Z9049 Acquired absence of other specified parts of digestive tract: Secondary | ICD-10-CM | POA: Insufficient documentation

## 2017-06-20 DIAGNOSIS — Z885 Allergy status to narcotic agent status: Secondary | ICD-10-CM | POA: Insufficient documentation

## 2017-06-20 DIAGNOSIS — Z9221 Personal history of antineoplastic chemotherapy: Secondary | ICD-10-CM | POA: Diagnosis not present

## 2017-06-20 DIAGNOSIS — Z79899 Other long term (current) drug therapy: Secondary | ICD-10-CM | POA: Insufficient documentation

## 2017-06-20 DIAGNOSIS — I10 Essential (primary) hypertension: Secondary | ICD-10-CM | POA: Diagnosis not present

## 2017-06-20 DIAGNOSIS — Z886 Allergy status to analgesic agent status: Secondary | ICD-10-CM | POA: Insufficient documentation

## 2017-06-20 DIAGNOSIS — C7800 Secondary malignant neoplasm of unspecified lung: Secondary | ICD-10-CM | POA: Diagnosis not present

## 2017-06-20 DIAGNOSIS — Z791 Long term (current) use of non-steroidal anti-inflammatories (NSAID): Secondary | ICD-10-CM | POA: Insufficient documentation

## 2017-06-20 DIAGNOSIS — Z800 Family history of malignant neoplasm of digestive organs: Secondary | ICD-10-CM | POA: Diagnosis not present

## 2017-06-20 DIAGNOSIS — Z91048 Other nonmedicinal substance allergy status: Secondary | ICD-10-CM | POA: Insufficient documentation

## 2017-06-20 DIAGNOSIS — C50919 Malignant neoplasm of unspecified site of unspecified female breast: Secondary | ICD-10-CM

## 2017-06-20 DIAGNOSIS — Z923 Personal history of irradiation: Secondary | ICD-10-CM | POA: Insufficient documentation

## 2017-06-20 DIAGNOSIS — Z8042 Family history of malignant neoplasm of prostate: Secondary | ICD-10-CM | POA: Insufficient documentation

## 2017-06-20 NOTE — Progress Notes (Signed)
Patient ID: [name] [name], female   DOB: 19-Jan-1971, 46 y.o.   MRN: [id number]   [place] NOTE  Patient Care Team: System, Pcp Not In as PCP - [place], [place], DO (Family Medicine) Holley Bouche, NP as Nurse Practitioner (Nurse Practitioner) Stark Klein, MD as Consulting Physician (General Surgery) Arloa Koh, MD as Consulting Physician (Radiation Oncology) [name], MD as Consulting Physician (Hematology)  HPI:   [name] [name] is a 46 y/o woman with Stage IV breast CA referred by Dr. Burr Medico for enrollment into the cardio-oncology clinic for surveillance while receiving Herceptin (started 04/12/15 and on indefinitiely)  Oncology History   Breast cancer 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   Staging form: Breast, AJCC 7th Edition     Clinical stage from 07/22/2014: Stage IV (T3, N1, M1) - Unsigned       Breast cancer 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 ([place])   07/02/2014 Mammogram    Mammogram showed a 2cm right beast mass and a 1.8cm right axillary node. MRI breast on 07/16/2014 showed 7cm R breast lesion and 4.4cm r axillary node       07/02/2014 Imaging    CT CAP: a 4.7cm mass in LUL lung and a 2.1cm mas in RML, and a small nodule in RUL, suspecious for metastasis        07/09/2014 Initial Diagnosis    right IDA with b/l lung lesions, ER-/PR-/HER2+      07/09/2014 Initial Biopsy    US guided right breast mass and axillary node biopsy showed IDA, and DCIS, ER-/PR-/HER2+      07/26/2014 Pathologic Stage    Left lung mass by IR, path revealed high grade carcinoma, morphology similar to breast tumor biopsy, TTF(-), NapsinA(-), ER(-)      08/04/2014 - 03/22/2015 Chemotherapy    weekly Paclitaxel 35m/m2, trastuzumab and pertuzumab every 3 weeks      08/30/2014 Genetic Testing    BreastNext panel was negative. 17 genes including BRCA1, BRCA2, were negative for mutations.       10/04/2014 Imaging    Interval decrease in the right axillary lymphadenopathy. Bilateral  pulmonary lesions with left hilar lymphadenopathy also markedly decreased in the interval. The left hilar lymphadenopathy has resolved.      12/20/2014 Imaging    restaging CT showed stable disease, no new lesions       03/29/2015 Pathology Results     right breast lumpectomy showed  chemotherapy treatment effect,  a 1 mm residual tumor,   margins were widely negative, 5 sentinel lymph nodes and 2 axillary lymph nodes were negative.       03/29/2015 Surgery     right breast lumpectomy and sentinel lymph node biopsy  by Dr. BBarry Dienes     04/12/2015 -  Chemotherapy     Herceptin maintenance therapy , 6 mg/kg, every 3 weeks      05/03/2015 - 06/14/2015 Radiation Therapy    right breast adjuvant irradiation by Dr. MValere Dross      08/28/2016 Imaging    CT CAP w Contrast  IMPRESSION: 1. No acute process or evidence of metastatic disease within the chest, abdomen, or pelvis. 2. Similar to less well-defined left upper lobe density, likely scarring. No evidence of new or progressive pulmonary metastasis. 3. Right nephrolithiasis.      01/22/2017 Imaging    CT Chest W Contrast 01/22/17 IMPRESSION: 1. Stable exam. No new or progressive findings. No evidence for metastatic disease. 2. Left upper lobe architectural distortion/scarring is stable. 3. Nonobstructing right renal  stone.       Continues to receive herceptin for metastatic breast CA (has been receiving Herceptin since 8/16. Doing well. Working all the time. No SOB, palpitations or edema.   Echo 06/20/17 Personally reviewed  EF 55-60% LS' not measured will  GLS -19.1  Echo 3/16 55-60% Lat s' 12.0 cm/sec GLS -17.5% Echo 2/17 55-60%  Lat s' 11.2 cm/sec GLS -17.2% Echo 01/18/16 EF 60-65% lat s' 7.9 cm/sec (out of plane) GLS -20.2% Echo 05/22/16 EF 60-65%  Lat s'11.2cm.sec GLS underestimated due to poor endocardial tracking Echo 5/18 EF 60% LS' 11.9 cm/s GLS -19.7  MEDICAL HISTORY:  Past Medical History:  Diagnosis Date  . Breast  cancer ([place])   . Breast cancer ([place])   . GERD (gastroesophageal reflux disease)    during pregnancy   . Pneumonia    hx of pneumonia 08/2013   . S/P radiation therapy 05/03/2015 through 06/14/2015    Right breast 4680 cGy in 26 sessions, right breast boost 1000 cGy in 5 sessions. Right supraclavicular/axillary region 4680 cGy with a supplemental PA field to bring the axillary dose up to 4500 cGy in 26 sessions    SURGICAL HISTORY: Past Surgical History:  Procedure Laterality Date  . CESAREAN SECTION    . CHOLECYSTECTOMY    . ESSURE TUBAL LIGATION    . WISDOM TOOTH EXTRACTION      SOCIAL HISTORY: History   Social History  . Marital Status: Married    Spouse Name: N/A    Number of Children:  she has 2 daughters at the age of 31 and 72.   . Years of Education: N/A   Occupational History  .  works as Freight forwarder for a [employer].    Social History Main Topics  . Smoking status: Never Smoker   . Smokeless tobacco: Not on file  . Alcohol Use: Yes  . Drug Use: No  . Sexual Activity: No    FAMILY HISTORY: Family History  Problem Relation Age of Onset  . Breast cancer Mother 46  . Liver cancer Father 27  . Breast cancer Maternal Aunt 36  . Prostate cancer Maternal Grandfather   . Liver cancer Paternal Grandmother   . Prostate cancer Paternal Grandfather      GENETICS: 08/30/2014 BreastNext panel was negative. 17 genes including BRCA1, BRCA2, were negative for mutations.   ALLERGIES:  is allergic to adhesive [tape]; aspirin; and codeine.  MEDICATIONS:  Current Outpatient Medications  Medication Sig Dispense Refill  . albuterol (PROVENTIL HFA;VENTOLIN HFA) 108 (90 Base) MCG/ACT inhaler Inhale 2 puffs into the lungs every 6 (six) hours as needed for wheezing or shortness of breath. 1 Inhaler 2  . Calcium-Phosphorus-Vitamin D (CALCIUM GUMMIES PO) Take by mouth daily.    . Camphor-Eucalyptus-Menthol (VICKS VAPORUB EX)  Apply 1 application topically at bedtime. Applies under nose, throat and on chest.    . cetirizine (ZYRTEC) 10 MG tablet Take 10 mg by mouth daily as needed for allergies (allergies).     . fexofenadine (ALLEGRA) 180 MG tablet Take 180 mg by mouth daily as needed.  4  . fluticasone (FLONASE) 50 MCG/ACT nasal spray   1  . hydrocortisone 2.5 % cream APPLY TOPICALLY TO THE AFFECTED AREA TWICE DAILY 454 g 0  . ibuprofen (ADVIL,MOTRIN) 800 MG tablet Take 1 tablet (800 mg total) by mouth every 8 (eight) hours as needed. 60 tablet 1  . lidocaine-prilocaine (EMLA) cream APPLY TOPICALLY TO THE AFFECTED AREA AS NEEDED 30 g 2  .  loperamide (IMODIUM A-D) 2 MG tablet Take 1 tablet (2 mg total) by mouth 4 (four) times daily as needed for diarrhea or loose stools. 30 tablet 0  . LORazepam (ATIVAN) 0.5 MG tablet Take 1 tablet (0.5 mg total) by mouth every 8 (eight) hours. 3 tablet 0  . metaxalone (SKELAXIN) 800 MG tablet Take 1 tablet (800 mg total) by mouth 3 (three) times daily as needed for muscle spasms. 60 tablet 1  . Multiple Vitamins-Minerals (A[place]NE PO) Take 1 tablet by mouth daily.    . Multiple Vitamins-Minerals (MULTIVITAMIN GUMMIES ADULT PO) Take 1 each by mouth daily. Women's Vitafusion gummie    . nystatin (NYSTATIN) powder Apply topically 2 (two) times daily. 15 g 2  . triamcinolone (KENALOG) 0.025 % ointment Apply 1 application topically 2 (two) times daily. 30 g 1   No current facility-administered medications for this encounter.    Facility-Administered Medications Ordered in Other Encounters  Medication Dose Route Frequency Provider Last Rate Last Dose  . acetaminophen (TYLENOL) tablet 650 mg  650 mg Oral Once [name], MD      . sodium chloride 0.9 % injection 10 mL  10 mL Intracatheter PRN [name], MD   10 mL at 05/01/16 1622  . sodium chloride 0.9 % injection 10 mL  10 mL Intravenous PRN [name], MD       Vitals:   06/20/17 1106  BP: (!) 158/98  Pulse: 71  SpO2: 99%      PHYSICAL EXAMINATION: General:  Well appearing. No resp difficulty HEENT: normal Neck: supple. no JVD. Carotids 2+ bilat; no bruits. No lymphadenopathy or thryomegaly appreciated. Cor: PMI nondisplaced. Regular rate & rhythm. No rubs, gallops or murmurs. Lungs: clear Abdomen: obese. soft, nontender, nondistended. No hepatosplenomegaly. No bruits or masses. Good bowel sounds. Extremities: no cyanosis, clubbing, rash, edema Neuro: alert & orientedx3, cranial nerves grossly intact. moves all 4 extremities w/o difficulty. Affect pleasant   LABORATORY DATA:  I have reviewed the data as listed CBC Latest Ref Rng & Units 05/14/2017 04/01/2017 01/21/2017  WBC 3.9 - 10.3 10e3/uL 7.0 6.7 6.4  Hemoglobin 11.6 - 15.9 g/dL 11.8 12.0 12.1  Hematocrit 34.8 - 46.6 % 36.0 37.2 36.8  Platelets 145 - 400 10e3/uL 206 230 237    CMP Latest Ref Rng & Units 05/14/2017 04/01/2017 01/21/2017  Glucose 70 - 140 mg/dl 123 88 81  BUN 7.0 - 26.0 mg/dL 13.0 15.4 11.5  Creatinine 0.6 - 1.1 mg/dL 0.9 1.0 0.8  Sodium 136 - 145 mEq/L 140 140 141  Potassium 3.5 - 5.1 mEq/L 3.7 4.0 4.0  Chloride 101 - 111 mmol/L - - -  CO2 22 - 29 mEq/L _0 Calcium 8.4 - 10.4 mg/dL 9.7 9.6 9.6  Total Protein 6.4 - 8.3 g/dL 8.0 7.5 7.6  Total Bilirubin 0.20 - 1.20 mg/dL 0.34 0.32 0.28  Alkaline Phos 40 - 150 U/L 71 74 72  AST 5 - 34 U/L _1 ALT 0 - 55 U/L _2 ASSESSMENT & PLAN:   1. R breast invasive ductal carcinoma, T3N1M1, stage IV, ER-/PR-/HER2+, with metastases to b/l lungs, biopsy confirmed - Explained incidence of Herceptin cardiotoxicity and role of Cardio-oncology clinic at length. Echo images reviewed personally. All parameters stable. Reviewed signs and symptoms of HF to look for. Continue Herceptin. Follow-up with echo in 6 months.   2. HTN - BP remains elevated here. Says over the past 3 months SBP in  130s consistently. Will keep following. Discussed need to keep SBP < 140.   Glori Bickers MD 06/20/2017

## 2017-06-20 NOTE — Progress Notes (Signed)
  Echocardiogram 2D Echocardiogram has been performed.  [name] [name] 06/20/2017, 11:05 AM

## 2017-06-25 ENCOUNTER — Ambulatory Visit (HOSPITAL_BASED_OUTPATIENT_CLINIC_OR_DEPARTMENT_OTHER): Payer: 59

## 2017-06-25 ENCOUNTER — Other Ambulatory Visit (HOSPITAL_BASED_OUTPATIENT_CLINIC_OR_DEPARTMENT_OTHER): Payer: 59

## 2017-06-25 VITALS — BP 156/89 | HR 93 | Temp 98.6°F | Resp 17

## 2017-06-25 DIAGNOSIS — C7802 Secondary malignant neoplasm of left lung: Secondary | ICD-10-CM

## 2017-06-25 DIAGNOSIS — Z5112 Encounter for antineoplastic immunotherapy: Secondary | ICD-10-CM

## 2017-06-25 DIAGNOSIS — C7800 Secondary malignant neoplasm of unspecified lung: Principal | ICD-10-CM

## 2017-06-25 DIAGNOSIS — C50911 Malignant neoplasm of unspecified site of right female breast: Secondary | ICD-10-CM

## 2017-06-25 LAB — COMPREHENSIVE METABOLIC PANEL
ALBUMIN: 3.4 g/dL — AB (ref 3.5–5.0)
ALK PHOS: 73 U/L (ref 40–150)
ALT: 28 U/L (ref 0–55)
AST: 23 U/L (ref 5–34)
Anion Gap: 8 mEq/L (ref 3–11)
BUN: 12.5 mg/dL (ref 7.0–26.0)
CALCIUM: 8.9 mg/dL (ref 8.4–10.4)
CHLORIDE: 107 meq/L (ref 98–109)
CO2: 25 mEq/L (ref 22–29)
Creatinine: 0.9 mg/dL (ref 0.6–1.1)
Glucose: 113 mg/dl (ref 70–140)
POTASSIUM: 4 meq/L (ref 3.5–5.1)
SODIUM: 141 meq/L (ref 136–145)
Total Bilirubin: 0.26 mg/dL (ref 0.20–1.20)
Total Protein: 7.4 g/dL (ref 6.4–8.3)

## 2017-06-25 LAB — CBC WITH DIFFERENTIAL/PLATELET
BASO%: 0.2 % (ref 0.0–2.0)
Basophils Absolute: 0 10*3/uL (ref 0.0–0.1)
EOS ABS: 0.1 10*3/uL (ref 0.0–0.5)
EOS%: 2.1 % (ref 0.0–7.0)
HEMATOCRIT: 36.4 % (ref 34.8–46.6)
HEMOGLOBIN: 11.8 g/dL (ref 11.6–15.9)
LYMPH#: 2 10*3/uL (ref 0.9–3.3)
LYMPH%: 30.4 % (ref 14.0–49.7)
MCH: 29.3 pg (ref 25.1–34.0)
MCHC: 32.4 g/dL (ref 31.5–36.0)
MCV: 90.3 fL (ref 79.5–101.0)
MONO#: 0.5 10*3/uL (ref 0.1–0.9)
MONO%: 6.9 % (ref 0.0–14.0)
NEUT%: 60.4 % (ref 38.4–76.8)
NEUTROS ABS: 4 10*3/uL (ref 1.5–6.5)
NRBC: 0 % (ref 0–0)
PLATELETS: 203 10*3/uL (ref 145–400)
RBC: 4.03 10*6/uL (ref 3.70–5.45)
RDW: 13.6 % (ref 11.2–14.5)
WBC: 6.6 10*3/uL (ref 3.9–10.3)

## 2017-06-25 MED ORDER — HEPARIN SOD (PORK) LOCK FLUSH 100 UNIT/ML IV SOLN
500.0000 [IU] | Freq: Once | INTRAVENOUS | Status: AC | PRN
  Administered 2017-06-25: 500 [IU]
  Filled 2017-06-25: qty 5

## 2017-06-25 MED ORDER — TRASTUZUMAB CHEMO 150 MG IV SOLR
600.0000 mg | Freq: Once | INTRAVENOUS | Status: AC
  Administered 2017-06-25: 600 mg via INTRAVENOUS
  Filled 2017-06-25: qty 28.57

## 2017-06-25 MED ORDER — ACETAMINOPHEN 325 MG PO TABS: 650.0000 mg | ORAL_TABLET | Freq: Once | ORAL | Status: DC

## 2017-06-25 MED ORDER — SODIUM CHLORIDE 0.9 % IJ SOLN
10.0000 mL | INTRAMUSCULAR | Status: DC | PRN
  Administered 2017-06-25: 10 mL
  Filled 2017-06-25: qty 10

## 2017-06-25 MED ORDER — SODIUM CHLORIDE 0.9 % IV SOLN
Freq: Once | INTRAVENOUS | Status: AC
  Administered 2017-06-25: 16:00:00 via INTRAVENOUS

## 2017-06-25 NOTE — Patient Instructions (Signed)
[facility] Discharge Instructions for Patients Receiving Chemotherapy  Today you received the following chemotherapy agent: Herceptin  To help prevent nausea and vomiting after your treatment, we encourage you to take your nausea medication as directed.   If you develop nausea and vomiting that is not controlled by your nausea medication, call the clinic.   BELOW ARE SYMPTOMS THAT SHOULD BE REPORTED IMMEDIATELY:  *FEVER GREATER THAN 100.5 F  *CHILLS WITH OR WITHOUT FEVER  NAUSEA AND VOMITING THAT IS NOT CONTROLLED WITH YOUR NAUSEA MEDICATION  *UNUSUAL SHORTNESS OF BREATH  *UNUSUAL BRUISING OR BLEEDING  TENDERNESS IN MOUTH AND THROAT WITH OR WITHOUT PRESENCE OF ULCERS  *URINARY PROBLEMS  *BOWEL PROBLEMS  UNUSUAL RASH Items with * indicate a potential emergency and should be followed up as soon as possible.  Feel free to call the clinic should you have any questions or concerns. The clinic phone number is [phone].  Please show the [facility] at check-in to the Emergency Department and triage nurse.

## 2017-07-09 ENCOUNTER — Other Ambulatory Visit: Payer: Self-pay | Admitting: Hematology

## 2017-07-16 ENCOUNTER — Other Ambulatory Visit: Payer: Self-pay | Admitting: *Deleted

## 2017-07-16 ENCOUNTER — Ambulatory Visit (HOSPITAL_BASED_OUTPATIENT_CLINIC_OR_DEPARTMENT_OTHER): Payer: 59

## 2017-07-16 VITALS — BP 167/84 | HR 79 | Temp 99.2°F | Resp 18

## 2017-07-16 DIAGNOSIS — Z5112 Encounter for antineoplastic immunotherapy: Secondary | ICD-10-CM | POA: Diagnosis not present

## 2017-07-16 DIAGNOSIS — Z23 Encounter for immunization: Secondary | ICD-10-CM

## 2017-07-16 DIAGNOSIS — C50911 Malignant neoplasm of unspecified site of right female breast: Secondary | ICD-10-CM | POA: Diagnosis not present

## 2017-07-16 DIAGNOSIS — C7802 Secondary malignant neoplasm of left lung: Secondary | ICD-10-CM

## 2017-07-16 DIAGNOSIS — C7800 Secondary malignant neoplasm of unspecified lung: Principal | ICD-10-CM

## 2017-07-16 MED ORDER — SODIUM CHLORIDE 0.9 % IJ SOLN
10.0000 mL | INTRAMUSCULAR | Status: DC | PRN
  Administered 2017-07-16: 10 mL
  Filled 2017-07-16: qty 10

## 2017-07-16 MED ORDER — SODIUM CHLORIDE 0.9 % IV SOLN
Freq: Once | INTRAVENOUS | Status: AC
  Administered 2017-07-16: 16:00:00 via INTRAVENOUS

## 2017-07-16 MED ORDER — SODIUM CHLORIDE 0.9 % IV SOLN
600.0000 mg | Freq: Once | INTRAVENOUS | Status: AC
  Administered 2017-07-16: 600 mg via INTRAVENOUS
  Filled 2017-07-16: qty 28.57

## 2017-07-16 MED ORDER — FLUCONAZOLE 100 MG PO TABS: 100.0000 mg | ORAL_TABLET | Freq: Once | ORAL | 0 refills | Status: AC

## 2017-07-16 MED ORDER — ACETAMINOPHEN 325 MG PO TABS
650.0000 mg | ORAL_TABLET | Freq: Once | ORAL | Status: AC
Start: 2017-07-16 — End: 2017-07-16
  Administered 2017-07-16: 650 mg via ORAL

## 2017-07-16 MED ORDER — HEPARIN SOD (PORK) LOCK FLUSH 100 UNIT/ML IV SOLN
500.0000 [IU] | Freq: Once | INTRAVENOUS | Status: AC | PRN
  Administered 2017-07-16: 500 [IU]
  Filled 2017-07-16: qty 5

## 2017-07-16 MED ORDER — ACETAMINOPHEN 325 MG PO TABS
ORAL_TABLET | ORAL | Status: AC
  Filled 2017-07-16: qty 2

## 2017-07-16 MED ORDER — INFLUENZA VAC SPLIT QUAD 0.5 ML IM SUSY
0.5000 mL | PREFILLED_SYRINGE | Freq: Once | INTRAMUSCULAR | Status: AC
  Administered 2017-07-16: 0.5 mL via INTRAMUSCULAR
  Filled 2017-07-16: qty 0.5

## 2017-07-16 NOTE — Patient Instructions (Signed)
[facility] Discharge Instructions for Patients Receiving Chemotherapy  Today you received the following chemotherapy agent: Herceptin  To help prevent nausea and vomiting after your treatment, we encourage you to take your nausea medication as directed.   If you develop nausea and vomiting that is not controlled by your nausea medication, call the clinic.   BELOW ARE SYMPTOMS THAT SHOULD BE REPORTED IMMEDIATELY:  *FEVER GREATER THAN 100.5 F  *CHILLS WITH OR WITHOUT FEVER  NAUSEA AND VOMITING THAT IS NOT CONTROLLED WITH YOUR NAUSEA MEDICATION  *UNUSUAL SHORTNESS OF BREATH  *UNUSUAL BRUISING OR BLEEDING  TENDERNESS IN MOUTH AND THROAT WITH OR WITHOUT PRESENCE OF ULCERS  *URINARY PROBLEMS  *BOWEL PROBLEMS  UNUSUAL RASH Items with * indicate a potential emergency and should be followed up as soon as possible.  Feel free to call the clinic should you have any questions or concerns. The clinic phone number is [phone].  Please show the [facility] at check-in to the Emergency Department and triage nurse.

## 2017-07-20 ENCOUNTER — Other Ambulatory Visit: Payer: Self-pay | Admitting: Nurse Practitioner

## 2017-07-30 ENCOUNTER — Other Ambulatory Visit: Payer: Self-pay | Admitting: Hematology

## 2017-07-30 DIAGNOSIS — C50911 Malignant neoplasm of unspecified site of right female breast: Secondary | ICD-10-CM

## 2017-07-30 DIAGNOSIS — C7800 Secondary malignant neoplasm of unspecified lung: Principal | ICD-10-CM

## 2017-07-31 ENCOUNTER — Ambulatory Visit (HOSPITAL_COMMUNITY)
Admission: RE | Admit: 2017-07-31 | Discharge: 2017-07-31 | Disposition: A | Discharge status: Home / Self Care | Payer: 59 | Source: Ambulatory Visit | Attending: Hematology | Admitting: Hematology

## 2017-07-31 ENCOUNTER — Encounter (HOSPITAL_COMMUNITY): Payer: Self-pay

## 2017-07-31 ENCOUNTER — Encounter (HOSPITAL_COMMUNITY)
Admission: RE | Admit: 2017-07-31 | Discharge: 2017-07-31 | Disposition: A | Discharge status: Home / Self Care | Payer: 59 | Source: Ambulatory Visit | Attending: Hematology | Admitting: Hematology

## 2017-07-31 DIAGNOSIS — M4185 Other forms of scoliosis, thoracolumbar region: Secondary | ICD-10-CM | POA: Diagnosis not present

## 2017-07-31 DIAGNOSIS — C50911 Malignant neoplasm of unspecified site of right female breast: Secondary | ICD-10-CM | POA: Diagnosis not present

## 2017-07-31 DIAGNOSIS — C7800 Secondary malignant neoplasm of unspecified lung: Secondary | ICD-10-CM | POA: Insufficient documentation

## 2017-07-31 DIAGNOSIS — I700 Atherosclerosis of aorta: Secondary | ICD-10-CM | POA: Diagnosis not present

## 2017-07-31 DIAGNOSIS — N200 Calculus of kidney: Secondary | ICD-10-CM | POA: Diagnosis not present

## 2017-07-31 DIAGNOSIS — I517 Cardiomegaly: Secondary | ICD-10-CM | POA: Insufficient documentation

## 2017-07-31 MED ORDER — IOPAMIDOL (ISOVUE-300) INJECTION 61%
30.0000 mL | Freq: Once | INTRAVENOUS | Status: AC | PRN
  Administered 2017-07-31: 30 mL via ORAL

## 2017-07-31 MED ORDER — HEPARIN SOD (PORK) LOCK FLUSH 100 UNIT/ML IV SOLN
INTRAVENOUS | Status: AC
  Filled 2017-07-31: qty 5

## 2017-07-31 MED ORDER — IOPAMIDOL (ISOVUE-300) INJECTION 61%
100.0000 mL | Freq: Once | INTRAVENOUS | Status: AC | PRN
  Administered 2017-07-31: 100 mL via INTRAVENOUS

## 2017-07-31 MED ORDER — IOPAMIDOL (ISOVUE-300) INJECTION 61%
INTRAVENOUS | Status: AC
  Administered 2017-07-31: 30 mL via ORAL
  Filled 2017-07-31: qty 100

## 2017-07-31 MED ORDER — IOPAMIDOL (ISOVUE-300) INJECTION 61%
INTRAVENOUS | Status: AC
  Administered 2017-07-31: 100 mL via INTRAVENOUS
  Filled 2017-07-31: qty 30

## 2017-07-31 MED ORDER — HEPARIN SOD (PORK) LOCK FLUSH 100 UNIT/ML IV SOLN
500.0000 [IU] | Freq: Once | INTRAVENOUS | Status: AC
  Administered 2017-07-31: 500 [IU] via INTRAVENOUS

## 2017-07-31 NOTE — Progress Notes (Signed)
[name] and oncology Follow up note   Patient Care Team: System, Pcp Not In as PCP - Rhodes, [place], DO (Family Medicine) Holley Bouche, NP as Nurse Practitioner (Nurse Practitioner) Stark Klein, MD as Consulting Physician (General Surgery) Arloa Koh, MD as Consulting Physician (Radiation Oncology) [name], MD as Consulting Physician ([name])   Date of Service:  08/01/2017    CHIEF COMPLAINTS Follow up metastatic breast cancer  Oncology History   Breast cancer 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   Staging form: Breast, AJCC 7th Edition     Clinical stage from 07/22/2014: Stage IV (T3, N1, M1) - Unsigned       Breast cancer 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 ([place])   07/02/2014 Mammogram    Mammogram showed a 2cm right beast mass and a 1.8cm right axillary node. MRI breast on 07/16/2014 showed 7cm R breast lesion and 4.4cm r axillary node       07/02/2014 Imaging    CT CAP: a 4.7cm mass in LUL lung and a 2.1cm mas in RML, and a small nodule in RUL, suspecious for metastasis        07/09/2014 Initial Diagnosis    right IDA with b/l lung lesions, ER-/PR-/HER2+      07/09/2014 Initial Biopsy    US guided right breast mass and axillary node biopsy showed IDA, and DCIS, ER-/PR-/HER2+      07/26/2014 Pathologic Stage    Left lung mass by IR, path revealed high grade carcinoma, morphology similar to breast tumor biopsy, TTF(-), NapsinA(-), ER(-)      08/04/2014 - 03/22/2015 Chemotherapy    weekly Paclitaxel 76m/m2, trastuzumab and pertuzumab every 3 weeks      08/30/2014 Genetic Testing    BreastNext panel was negative. 17 genes including BRCA1, BRCA2, were negative for mutations.       10/04/2014 Imaging    Interval decrease in the right axillary lymphadenopathy. Bilateral pulmonary lesions with left hilar lymphadenopathy also markedly decreased in the interval. The left hilar lymphadenopathy has resolved.      12/20/2014 Imaging    restaging  CT showed stable disease, no new lesions       03/29/2015 Pathology Results     right breast lumpectomy showed  chemotherapy treatment effect,  a 1 mm residual tumor,   margins were widely negative, 5 sentinel lymph nodes and 2 axillary lymph nodes were negative.       03/29/2015 Surgery     right breast lumpectomy and sentinel lymph node biopsy  by Dr. BBarry Dienes     04/12/2015 -  Chemotherapy     Herceptin maintenance therapy , 6 mg/kg, every 3 weeks      05/03/2015 - 06/14/2015 Radiation Therapy    right breast adjuvant irradiation by Dr. MValere Dross      08/28/2016 Imaging    CT CAP w Contrast  IMPRESSION: 1. No acute process or evidence of metastatic disease within the chest, abdomen, or pelvis. 2. Similar to less well-defined left upper lobe density, likely scarring. No evidence of new or progressive pulmonary metastasis. 3. Right nephrolithiasis.      01/22/2017 Imaging    CT Chest W Contrast 01/22/17 IMPRESSION: 1. Stable exam. No new or progressive findings. No evidence for metastatic disease. 2. Left upper lobe architectural distortion/scarring is stable. 3. Nonobstructing right renal stone.      07/31/2017 Imaging    Bone Scan Whole Body 07/31/17  IMPRESSION: 1. No scintigraphic evidence of osseous metastatic disease. 2. Thoracolumbar scoliosis.  07/31/2017 Imaging    CT CAP W Contrast 07/31/17 IMPRESSION: 1. No findings of active or recurrent malignancy. 2. Other imaging findings of potential clinical significance: Aortic Atherosclerosis (ICD10-I70.0). Mild cardiomegaly. Postoperative and radiation therapy findings in the right chest. Nonobstructive right nephrolithiasis. Thoracolumbar scoliosis.        CURRENT THERAPY:  Herceptin maintenance therapy every 3 weeks, started on 04/12/2015  INTERVAL HISTORY:   Amandeep returns for follow-up and for her next dose of Herceptin. She presents to the clinic today noting her sinus flared up and she took zertec.  She denies cough or any new pain. She is overall doing well.     MEDICAL HISTORY:  Past Medical History:  Diagnosis Date  . Breast cancer ([place])   . Breast cancer ([place])   . GERD (gastroesophageal reflux disease)    during pregnancy   . Pneumonia    hx of pneumonia 08/2013   . S/P radiation therapy 05/03/2015 through 06/14/2015    Right breast 4680 cGy in 26 sessions, right breast boost 1000 cGy in 5 sessions. Right supraclavicular/axillary region 4680 cGy with a supplemental PA field to bring the axillary dose up to 4500 cGy in 26 sessions    SURGICAL HISTORY: Past Surgical History:  Procedure Laterality Date  . BREAST LUMPECTOMY WITH RADIOACTIVE SEED AND SENTINEL LYMPH NODE BIOPSY Right 03/29/2015   Procedure: BREAST LUMPECTOMY WITH RADIOACTIVE SEED AND SENTINEL LYMPH NODE BIOPSY;  Surgeon: Stark Klein, MD;  Location: [facility];  Service: General;  Laterality: Right;  . CESAREAN SECTION    . CHOLECYSTECTOMY    . ESSURE TUBAL LIGATION    . PORTACATH PLACEMENT Left 07/21/2014   Procedure: INSERTION PORT-A-CATH;  Surgeon: Stark Klein, MD;  Location: [facility];  Service: General;  Laterality: Left;  . WISDOM TOOTH EXTRACTION      SOCIAL HISTORY: Social History   Socioeconomic History  . Marital status: Married    Spouse name: None  . Number of children: None  . Years of education: None  . Highest education level: None  Social Needs  . Financial resource strain: None  . Food insecurity - worry: None  . Food insecurity - inability: None  . Transportation needs - medical: None  . Transportation needs - non-medical: None  Occupational History  . None  Tobacco Use  . Smoking status: Never Smoker  . Smokeless tobacco: Never Used  Substance and Sexual Activity  . Alcohol use: Yes    Comment: 2-3 drinks per week   . Drug use: No  . Sexual activity: No    Birth control/protection: Surgical  Other Topics Concern   . None  Social History Narrative  . None    FAMILY HISTORY: Family History  Problem Relation Age of Onset  . Breast cancer Mother 35  . Liver cancer Father   . Breast cancer Maternal Aunt 66  . Breast cancer Maternal Aunt 19  . Prostate cancer Maternal Grandfather 32  . Liver cancer Paternal Grandmother   . Prostate cancer Paternal Grandfather   . Prostate cancer Maternal Uncle 73  . Prostate cancer Maternal Uncle 68  . Breast cancer Cousin 65       mat first cousin    GENETICS: 08/30/2014 BreastNext panel was negative. 17 genes including BRCA1, BRCA2, were negative for mutations.   ALLERGIES:  is allergic to adhesive [tape]; aspirin; and codeine.  MEDICATIONS:  Current Outpatient Medications  Medication Sig Dispense Refill  . albuterol (PROVENTIL HFA;VENTOLIN HFA) 108 (90 Base) MCG/ACT  inhaler Inhale 2 puffs into the lungs every 6 (six) hours as needed for wheezing or shortness of breath. 1 Inhaler 2  . Calcium-Phosphorus-Vitamin D (CALCIUM GUMMIES PO) Take by mouth daily.    . Camphor-Eucalyptus-Menthol (VICKS VAPORUB EX) Apply 1 application topically at bedtime. Applies under nose, throat and on chest.    . cetirizine (ZYRTEC) 10 MG tablet Take 10 mg by mouth daily as needed for allergies (allergies).     . fexofenadine (ALLEGRA) 180 MG tablet Take 180 mg by mouth daily as needed.  4  . fluticasone (FLONASE) 50 MCG/ACT nasal spray   1  . hydrocortisone 2.5 % cream APPLY TOPICALLY TO THE AFFECTED AREA TWICE DAILY 454 g 0  . ibuprofen (ADVIL,MOTRIN) 800 MG tablet Take 1 tablet (800 mg total) by mouth every 8 (eight) hours as needed. 60 tablet 1  . lidocaine-prilocaine (EMLA) cream APPLY TOPICALLY TO THE AFFECTED AREA AS NEEDED 30 g 2  . loperamide (IMODIUM A-D) 2 MG tablet Take 1 tablet (2 mg total) by mouth 4 (four) times daily as needed for diarrhea or loose stools. 30 tablet 0  . LORazepam (ATIVAN) 0.5 MG tablet Take 1 tablet (0.5 mg total) by mouth every 8 (eight) hours.  3 tablet 0  . metaxalone (SKELAXIN) 800 MG tablet Take 1 tablet (800 mg total) by mouth 3 (three) times daily as needed for muscle spasms. 60 tablet 1  . Multiple Vitamins-Minerals (AIRBORNE PO) Take 1 tablet by mouth daily.    . Multiple Vitamins-Minerals (MULTIVITAMIN GUMMIES ADULT PO) Take 1 each by mouth daily. Women's Vitafusion gummie    . nystatin (NYSTATIN) powder Apply topically 2 (two) times daily. 15 g 2  . triamcinolone (KENALOG) 0.025 % ointment Apply 1 application topically 2 (two) times daily. 30 g 1   No current facility-administered medications for this visit.    Facility-Administered Medications Ordered in Other Visits  Medication Dose Route Frequency Provider Last Rate Last Dose  . acetaminophen (TYLENOL) tablet 650 mg  650 mg Oral Once [name], MD      . acetaminophen (TYLENOL) tablet 650 mg  650 mg Oral Once [name], MD      . sodium chloride 0.9 % injection 10 mL  10 mL Intracatheter PRN [name], MD   10 mL at 05/01/16 1622  . sodium chloride 0.9 % injection 10 mL  10 mL Intravenous PRN [name], MD      . sodium chloride 0.9 % injection 10 mL  10 mL Intracatheter PRN [name], MD   10 mL at 08/01/17 1547    REVIEW OF SYSTEMS:   Constitutional: Denies fevers, chills or abnormal night sweats  Eyes: Denies blurriness of vision, double vision or watery eyes Ears, nose, mouth, throat, and face: Denies mucositis or sore throat Respiratory: Occasional dry cough, no dyspnea or wheezes Cardiovascular: Denies palpitation, chest discomfort or lower extremity swelling Gastrointestinal:  Denies nausea, heartburn or change in bowel habits Skin: negative Lymphatics: Denies new lymphadenopathy or easy bruising MSK: negative Neurological:Denies numbness, new weaknesses  Behavioral/Psych: Mood is stable, no new changes  All other systems were reviewed with the patient and are negative.  PHYSICAL EXAMINATION: ECOG PERFORMANCE STATUS: 0  Vitals:   08/01/17 1400 08/01/17  1403  BP: (!) 157/84   Pulse: 87   Resp: 18   Temp: 98.7 F (37.1 C)   TempSrc: Oral   SpO2:  100%  Weight: 234 lb (106.1 kg)   Height: _0  (1.778 m)  GENERAL:alert, no distress and comfortable SKIN: skin color, texture, turgor are normal, no rashes or significant lesions EYES: normal, conjunctiva are pink and non-injected, sclera clear OROPHARYNX:no exudate, no erythema and lips, buccal mucosa, and tongue normal.  NECK: supple, thyroid normal size, non-tender, without nodularity LYMPH:  no palpable lymphadenopathy in the cervical, axillary or inguinal LUNGS: clear to auscultation and percussion with normal breathing effort HEART: regular rate & rhythm and no murmurs and no lower extremity edema ABDOMEN:abdomen soft, non-tender and normal bowel sounds Musculoskeletal:no cyanosis of digits and no clubbing, no tenderness over her right shoulder or spine  PSYCH: alert & oriented x 3 with fluent speech NEURO: no focal motor/sensory deficits Breasts:  Previously, status post right breast lumpectomy and sentinel lymph node biopsy, incisions have healed well. The previous palpable seroma has resolved. (+) mild skin pigmentation of right breast secondary to radiation. Palpation of both breasts  and axillas revealed no other obvious mass that I could appreciate.  Skin: No skin rashes.   LABORATORY DATA:  I have reviewed the data as listed CBC Latest Ref Rng & Units 08/01/2017 06/25/2017 05/14/2017  WBC 3.9 - 10.3 10e3/uL 6.7 6.6 7.0  Hemoglobin 11.6 - 15.9 g/dL 11.9 11.8 11.8  Hematocrit 34.8 - 46.6 % 36.3 36.4 36.0  Platelets 145 - 400 10e3/uL 219 203 206    CMP Latest Ref Rng & Units 08/01/2017 06/25/2017 05/14/2017  Glucose 70 - 140 mg/dl 108 113 123  BUN 7.0 - 26.0 mg/dL 10.5 12.5 13.0  Creatinine 0.6 - 1.1 mg/dL 0.9 0.9 0.9  Sodium 136 - 145 mEq/L 141 141 140  Potassium 3.5 - 5.1 mEq/L 3.6 4.0 3.7  Chloride 101 - 111 mmol/L - - -  CO2 22 - 29 mEq/L _0 Calcium 8.4 -  10.4 mg/dL 9.4 8.9 9.7  Total Protein 6.4 - 8.3 g/dL 7.6 7.4 8.0  Total Bilirubin 0.20 - 1.20 mg/dL 0.25 0.26 0.34  Alkaline Phos 40 - 150 U/L 73 73 71  AST 5 - 34 U/L _1 ALT 0 - 55 U/L _2 PATHOLOGY REPORT 07/09/2014 #1 breast, right needle core biopsy more anterior -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 45)  Lung, biopsy, Left 07/26/2014 - HIGH GRADE CARCINOMA, SEE COMMENT. Microscopic Comment The carcinoma demonstrates the following immunophenotype: TTF-1 - negative expression. Napsin A- negative expression. CK5/6 - focal, moderate expression. estrogen receptor - negative expression. GCDFP- negative expression. The recent breast biopsy demonstrating Her2 amplified high grade invasive mammary carcinoma is noted ([id number]). Although the immunophenotype of the current case is non-sepcific, it strongly argues against primary lung adenocarcinoma. However, on re-review, the morphology of the current case is essentially identical to the primary mammary carcinoma. In lieu of further immunophenotyping, the tumor will be submitted for Her2 testing and the remaining tissue will be reserved for additional ancillary tumor testing. The results of the Her2 testing will be reported in an addendum. The case was discussed with Dr Burr Medico on 07/28/2015 and 07/29/2015 HER2 POSITIVE   Diagnosis 03/29/2015 1. Breast, lumpectomy, Right - INVASIVE DUCTAL CARCINOMA, SEE COMMENT. - SEE TUMOR SYNOPTIC TEMPLATE BELOW. 2. Lymph node, sentinel, biopsy, Right axillary #1 - ONE LYMPH NODE, NEGATIVE FOR TUMOR (0/1). - SEE COMMENT. 3. Lymph node, sentinel, biopsy, Right axillary #2 -  BENIGN FIBROFATTY SOFT TISSUE. - NEGATIVE FOR ATYPIA OR MALIGNANCY. - NEGATIVE FOR  LYMPH NODE. 4. Lymph node, sentinel, biopsy, Right axillary #3 - ONE LYMPH NODE, NEGATIVE FOR TUMOR (0/1). - SEE COMMENT. 5. Lymph node, sentinel, biopsy, Right axillary #4 - ONE LYMPH NODE, NEGATIVE FOR TUMOR (0/1). - SEE COMMENT. 6. Lymph node, sentinel, biopsy, Right axillary #5 - ONE LYMPH NODE, NEGATIVE FOR TUMOR (0/1). - SEE COMMENT. 7. Lymph node, sentinel, biopsy, right axillary - ONE LYMPH NODE, NEGATIVE FOR TUMOR (0/1). - SEE COMMENT. 8. Lymph node, sentinel, biopsy, right axillary - ONE LYMPH NODE, NEGATIVE FOR TUMOR (0/1). - SEE COMMENT. 1 of 4 Amended copy Amended FINAL for GENEVIEVE, ARBAUGH ([id number]) Microscopic Comment 1. BREAST, INVASIVE TUMOR, WITH LYMPH NODES PRESENT Specimen, including laterality and lymph node sampling (sentinel, non-sentinel): Right breast with sentinel and non-sentinel lymph node sampling Procedure: Lumpectomy Histologic type: Ductal, see comment Grade: 2 of 3, see comment. Tubule formation: 3 Nuclear pleomorphism: 3 Mitotic: 1 Tumor size (glass slide measurement): 1 mm, see comment Margins: Invasive, distance to closest margin: See comment In-situ, distance to closest margin: N/A If margin positive, focally or broadly: N/A Lymphovascular invasion: Absent Ductal carcinoma in situ: Absent Grade: N/A Extensive intraductal component: N/A Lobular neoplasia: Absent Tumor focality: Unifocal, see comment Treatment effect: Present If present, treatment effect in breast tissue, lymph nodes or both: Both breast tissue and lymph nodes Extent of tumor: Skin: N/A Nipple: N/A Skeletal muscle: N/A Lymph nodes: Examined: 5 Sentinel 2 Non-sentinel 7 Total Lymph nodes with metastasis: 0 Isolated tumor cells (< 0.2 mm): N/A Micrometastasis: (> 0.2 mm and < 2.0 mm): N/A Macrometastasis: (> 2.0 mm): N/A Extracapsular extension: N/A Breast prognostic profile: Estrogen receptor: Not repeated, previous study demonstrated 0% positivity  ([id number]). Progesterone receptor: Not repeated, previous study demonstrated 0% positivity ([id number]) Her 2 neu: Demonstrates amplificaition ([id number]) Ki-67: Not repeated, previous study demonstrate 79% proliferation rate ([id number]). Non-neoplastic breast: Neoadjuvant related tissue changes and adenosis. TNM: ypT40m, ypN0, pMX Comments: Numerous representative sections including the 2.0 cm hemorrhagic tissue associated with X-shaped biopsy clip, the 2.0 cm ill defined lesion associated with M-shaped clip, and intervening tissue were submitted for review. Slide sections from the tissue surrounding the X-shaped and M-shaped clips demonstrate tissue changes consistent with neoadjuvant related change. There is no invasive or in situ carcinom present at either site. However, in a representative section from the intervening tissue (slide G), there is a 1 mm focus of high grade invasive ductal carcinoma present. Given the minute size of the tumor, tumor grading is limited. The tumor is present within non-marginal tissue sections and is considered to be at least 0.5 cm from the nearest margin (medial).   Note: Amendment issued for modification of synoptic table, inadvertent typographical error. The change in the synoptic table was discussed with Dr FBurr Medicoon 04/07/2015. (CRR:ecj 04/07/2015) 2. , 4-8. In parts 2 and 4, there is extensive neoadjuvant related tissue changes including abundant foamy macrophages, fibroinflammatory reaction and dystrophic calcifications. The neoadjuvant tissue changes extend into perinodal soft tissue and are either focally or broadly present at the cauterized edge of the tissue submitted. There are no definitive features of malignancy present.   PROCEDURES  ECHO 06/20/17  Study Conclusions - Left ventricle: The cavity size was normal. Wall thickness was   normal. Systolic function was normal. The estimated ejection   fraction was in the range of 55% to 60%.  Wall motion was normal;   there were no regional wall motion  abnormalities. - Mitral valve: There was mild regurgitation. - Impressions: LS&' not measured well. GLS - 19.1% Impressions: - LS&' not measured well. GLS - 19.1%   ECHO 12/12/16 Study Conclusions - Left ventricle: The cavity size was normal. Wall thickness was   normal. Systolic function was normal. The estimated ejection   fraction was in the range of 55% to 60%. Wall motion was normal;   there were no regional wall motion abnormalities. Doppler   parameters are consistent with abnormal left ventricular   relaxation (grade 1 diastolic dysfunction). Impressions: - Normal LV systolic function; mild diastolic dysfunction; trace MR   and TR; global longitudinal strain -19.7%.   RADIOGRAPHIC STUDIES:   CT CAP W Contrast 07/31/17 IMPRESSION: 1. No findings of active or recurrent malignancy. 2. Other imaging findings of potential clinical significance: Aortic Atherosclerosis (ICD10-I70.0). Mild cardiomegaly. Postoperative and radiation therapy findings in the right chest. Nonobstructive right nephrolithiasis. Thoracolumbar scoliosis.   Bone Scan Whole Body 07/31/17  IMPRESSION: 1. No scintigraphic evidence of osseous metastatic disease. 2. Thoracolumbar scoliosis.   Diagnostic Mammogram Bilateral at [facility] 07/17/17  IMPRESSION:  There is no mammographic evidence of malignancy. A follow-up mamomgram in 12 months is recommended.    CT Chest W Contrast 01/22/17 IMPRESSION: 1. Stable exam. No new or progressive findings. No evidence for metastatic disease. 2. Left upper lobe architectural distortion/scarring is stable. 3. Nonobstructing right renal stone.    ASSESSMENT & PLAN:  46 y.o.  African-American female, premenopausal, without significant past medical history, presented with palpable right breast mass and right axillary mass.   1. 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 - I previously reviewed her breasts surgical pathology results with her in great detail ,  She has had fantastic partial response (near complete response) form 8 months of chemotherapy and antibody therapy.   The initial 6.3 cm breast mass has only 1 mm residual tumor,  And the previously 4.4 cm right axillary lymph node has had complete  Pathological response, all 7 nodes were negative for cancer. -I reviewed her restaging CT scan from 01/22/2017, which showed no evidence of disease, the scar tissue in left upper lobe lung is unchanged. No other new lesions -Continue monitoring cardiac echo every 6 months, she will follow up with Dr. BHaroldine Laws -She previously had her mammogram done in December 2017, which was unremarkable  -We discussed her CT CAP and Bone scan from 07/31/17 show no evidence of recurrence or metastasis. Her 07/17/17 mammogram was normal.  -Her 06/2017 ECHO show 55-60% EF -She is clinically doing very well, lab reviewed, adequate for treatment, we'll continue Herceptin every 3 weeks, indefinitely.  -We'll see her back in 9 weeks with labs  2. Recurrent sinusitis/URI-like symptoms -She has previously had recurrent sinusitis, and bronchitis. Her symptom has resolved lately. -Her symptoms have returned due to allergy season.  -She did recently have a flare up of this, including sore throat. She was placed on Azithromycin by her PCP and her symptoms have now improved as of today (06/04/17). On exam, her throat appears well.  -I also encouraged her to rest well and drink adequate amounts of water to assist with this.   3. Mild peripheral neuropathy, G1 -Probably related to her prior chemotherapy -previously very mild, we'll continue monitoring.   4. History  of right sided chest  pain -She has previously been having intermittent right-sided posterior low chest pain, radiates to her lower back. -She has some abnormal uptake on her bone scan in July 2017, possible old fracture. -She has a kidney stone, which has been present since 2015, but I doubt her chest pains related to her case to him. -Her previous pain has resolved   5. HTN? -No prior history of HTN, but her BP has been slightly elevated lately  -f/u with PCP -I encourage a healthy low salt diet and exercise. She agrees and is going to start walking after work, I also strongly encouraged her to lose weight. She does not want to take medication, and is motivated to follow diet and exercise  -I previously encouraged her to check her BP at home or at a pharmacy.  -she will follow up with Dr. Haroldine Laws    6. Obesity  -I previously encouraged her to have healthy diet, exercise regularly, and try to lose some weight.   Plan  -restaging CT reviewed, NED -Labs adequate to proceed with Herceptin today  -Hercpetin every 3 weeks X3 -Lab, flush and f/u 9 weeks    I spent about 20 minutes counseling the patient and her family members, total care was about 25 minutes.  This document serves as a record of services personally performed by [name], MD. It was created on her behalf by Joslyn Devon, a trained medical scribe. The creation of this record is based on the scribe's personal observations and the provider's statements to them.    I have reviewed the above documentation for accuracy and completeness, and I agree with the above.   [name]  08/01/2017

## 2017-08-01 ENCOUNTER — Telehealth: Payer: Self-pay | Admitting: Hematology

## 2017-08-01 ENCOUNTER — Ambulatory Visit: Payer: 59

## 2017-08-01 ENCOUNTER — Encounter: Payer: Self-pay | Admitting: Hematology

## 2017-08-01 ENCOUNTER — Other Ambulatory Visit (HOSPITAL_BASED_OUTPATIENT_CLINIC_OR_DEPARTMENT_OTHER): Payer: 59

## 2017-08-01 ENCOUNTER — Ambulatory Visit (HOSPITAL_BASED_OUTPATIENT_CLINIC_OR_DEPARTMENT_OTHER): Payer: 59

## 2017-08-01 ENCOUNTER — Ambulatory Visit (HOSPITAL_BASED_OUTPATIENT_CLINIC_OR_DEPARTMENT_OTHER): Payer: 59 | Admitting: Hematology

## 2017-08-01 DIAGNOSIS — Z171 Estrogen receptor negative status [ER-]: Principal | ICD-10-CM

## 2017-08-01 DIAGNOSIS — C50511 Malignant neoplasm of lower-outer quadrant of right female breast: Secondary | ICD-10-CM

## 2017-08-01 DIAGNOSIS — C50911 Malignant neoplasm of unspecified site of right female breast: Secondary | ICD-10-CM | POA: Diagnosis not present

## 2017-08-01 DIAGNOSIS — C7800 Secondary malignant neoplasm of unspecified lung: Secondary | ICD-10-CM

## 2017-08-01 DIAGNOSIS — G629 Polyneuropathy, unspecified: Secondary | ICD-10-CM | POA: Diagnosis not present

## 2017-08-01 DIAGNOSIS — Z5112 Encounter for antineoplastic immunotherapy: Secondary | ICD-10-CM | POA: Diagnosis not present

## 2017-08-01 DIAGNOSIS — E669 Obesity, unspecified: Secondary | ICD-10-CM

## 2017-08-01 DIAGNOSIS — Z95828 Presence of other vascular implants and grafts: Secondary | ICD-10-CM

## 2017-08-01 LAB — CBC WITH DIFFERENTIAL/PLATELET
BASO%: 0.3 % (ref 0.0–2.0)
Basophils Absolute: 0 10*3/uL (ref 0.0–0.1)
EOS%: 1.5 % (ref 0.0–7.0)
Eosinophils Absolute: 0.1 10*3/uL (ref 0.0–0.5)
HCT: 36.3 % (ref 34.8–46.6)
HEMOGLOBIN: 11.9 g/dL (ref 11.6–15.9)
LYMPH%: 31.4 % (ref 14.0–49.7)
MCH: 29.7 pg (ref 25.1–34.0)
MCHC: 32.8 g/dL (ref 31.5–36.0)
MCV: 90.5 fL (ref 79.5–101.0)
MONO#: 0.5 10*3/uL (ref 0.1–0.9)
MONO%: 7.6 % (ref 0.0–14.0)
NEUT%: 59.2 % (ref 38.4–76.8)
NEUTROS ABS: 4 10*3/uL (ref 1.5–6.5)
Platelets: 219 10*3/uL (ref 145–400)
RBC: 4.01 10*6/uL (ref 3.70–5.45)
RDW: 13.7 % (ref 11.2–14.5)
WBC: 6.7 10*3/uL (ref 3.9–10.3)
lymph#: 2.1 10*3/uL (ref 0.9–3.3)

## 2017-08-01 LAB — COMPREHENSIVE METABOLIC PANEL
ALBUMIN: 3.6 g/dL (ref 3.5–5.0)
ALK PHOS: 73 U/L (ref 40–150)
ALT: 26 U/L (ref 0–55)
ANION GAP: 9 meq/L (ref 3–11)
AST: 21 U/L (ref 5–34)
BILIRUBIN TOTAL: 0.25 mg/dL (ref 0.20–1.20)
BUN: 10.5 mg/dL (ref 7.0–26.0)
CALCIUM: 9.4 mg/dL (ref 8.4–10.4)
CO2: 27 mEq/L (ref 22–29)
CREATININE: 0.9 mg/dL (ref 0.6–1.1)
Chloride: 105 mEq/L (ref 98–109)
EGFR: >60 mL/min/{1.73_m2} (ref >60)
Glucose: 108 mg/dl (ref 70–140)
Potassium: 3.6 mEq/L (ref 3.5–5.1)
Sodium: 141 mEq/L (ref 136–145)
TOTAL PROTEIN: 7.6 g/dL (ref 6.4–8.3)

## 2017-08-01 MED ORDER — SODIUM CHLORIDE 0.9 % IJ SOLN
10.0000 mL | INTRAMUSCULAR | Status: DC | PRN
  Administered 2017-08-01: 10 mL via INTRAVENOUS
  Filled 2017-08-01: qty 10

## 2017-08-01 MED ORDER — HEPARIN SOD (PORK) LOCK FLUSH 100 UNIT/ML IV SOLN
500.0000 [IU] | Freq: Once | INTRAVENOUS | Status: AC | PRN
  Administered 2017-08-01: 500 [IU]
  Filled 2017-08-01: qty 5

## 2017-08-01 MED ORDER — ACETAMINOPHEN 325 MG PO TABS: 650.0000 mg | ORAL_TABLET | Freq: Once | ORAL | Status: DC

## 2017-08-01 MED ORDER — TRASTUZUMAB CHEMO 150 MG IV SOLR
600.0000 mg | Freq: Once | INTRAVENOUS | Status: AC
  Administered 2017-08-01: 600 mg via INTRAVENOUS
  Filled 2017-08-01: qty 28.57

## 2017-08-01 MED ORDER — SODIUM CHLORIDE 0.9 % IV SOLN
Freq: Once | INTRAVENOUS | Status: AC
  Administered 2017-08-01: 15:00:00 via INTRAVENOUS

## 2017-08-01 MED ORDER — SODIUM CHLORIDE 0.9 % IJ SOLN
10.0000 mL | INTRAMUSCULAR | Status: DC | PRN
  Administered 2017-08-01: 10 mL
  Filled 2017-08-01: qty 10

## 2017-08-01 MED ORDER — LIDOCAINE-PRILOCAINE 2.5-2.5 % EX CREA: TOPICAL_CREAM | CUTANEOUS | 2 refills | Status: DC

## 2017-08-01 NOTE — Patient Instructions (Signed)
[facility] Discharge Instructions for Patients Receiving Chemotherapy  Today you received the following chemotherapy agents Herceptin  To help prevent nausea and vomiting after your treatment, we encourage you to take your nausea medication as directed   If you develop nausea and vomiting that is not controlled by your nausea medication, call the clinic.   BELOW ARE SYMPTOMS THAT SHOULD BE REPORTED IMMEDIATELY:  *FEVER GREATER THAN 100.5 F  *CHILLS WITH OR WITHOUT FEVER  NAUSEA AND VOMITING THAT IS NOT CONTROLLED WITH YOUR NAUSEA MEDICATION  *UNUSUAL SHORTNESS OF BREATH  *UNUSUAL BRUISING OR BLEEDING  TENDERNESS IN MOUTH AND THROAT WITH OR WITHOUT PRESENCE OF ULCERS  *URINARY PROBLEMS  *BOWEL PROBLEMS  UNUSUAL RASH Items with * indicate a potential emergency and should be followed up as soon as possible.  Feel free to call the clinic should you have any questions or concerns. The clinic phone number is [phone].  Please show the CHEMO ALERT CARD at check-in to the Emergency Department and triage nurse.   

## 2017-08-01 NOTE — Telephone Encounter (Signed)
Gave patient avs and calendar with appts per 12/20 los - in infusion.

## 2017-08-01 NOTE — Patient Instructions (Signed)

## 2017-08-22 ENCOUNTER — Ambulatory Visit: Payer: 59

## 2017-08-27 ENCOUNTER — Inpatient Hospital Stay: Payer: 59 | Attending: Hematology

## 2017-08-27 ENCOUNTER — Other Ambulatory Visit: Payer: Self-pay | Admitting: Hematology and Oncology

## 2017-08-27 VITALS — BP 130/85 | HR 78 | Temp 99.0°F | Resp 18

## 2017-08-27 DIAGNOSIS — C50911 Malignant neoplasm of unspecified site of right female breast: Secondary | ICD-10-CM

## 2017-08-27 DIAGNOSIS — C7800 Secondary malignant neoplasm of unspecified lung: Secondary | ICD-10-CM | POA: Diagnosis present

## 2017-08-27 DIAGNOSIS — Z5112 Encounter for antineoplastic immunotherapy: Secondary | ICD-10-CM | POA: Diagnosis present

## 2017-08-27 MED ORDER — SODIUM CHLORIDE 0.9 % IV SOLN
Freq: Once | INTRAVENOUS | Status: AC
  Administered 2017-08-27: 16:00:00 via INTRAVENOUS

## 2017-08-27 MED ORDER — TRASTUZUMAB CHEMO 150 MG IV SOLR
600.0000 mg | Freq: Once | INTRAVENOUS | Status: AC
  Administered 2017-08-27: 600 mg via INTRAVENOUS
  Filled 2017-08-27: qty 28.57

## 2017-08-27 MED ORDER — HEPARIN SOD (PORK) LOCK FLUSH 100 UNIT/ML IV SOLN
500.0000 [IU] | Freq: Once | INTRAVENOUS | Status: AC | PRN
  Administered 2017-08-27: 500 [IU]
  Filled 2017-08-27: qty 5

## 2017-08-27 MED ORDER — NYSTATIN 100000 UNIT/GM EX POWD: Freq: Four times a day (QID) | CUTANEOUS | 0 refills | Status: DC

## 2017-08-27 MED ORDER — ACETAMINOPHEN 325 MG PO TABS: 650.0000 mg | ORAL_TABLET | Freq: Once | ORAL | Status: DC

## 2017-08-27 MED ORDER — SODIUM CHLORIDE 0.9 % IJ SOLN
10.0000 mL | INTRAMUSCULAR | Status: DC | PRN
  Administered 2017-08-27: 10 mL
  Filled 2017-08-27: qty 10

## 2017-08-27 NOTE — Progress Notes (Signed)
Patient reports redness, moisture, and odor beneath breast. On call physician made aware. Patient made aware of prescription sent to pharmacy. Pharmacy verified.

## 2017-08-27 NOTE — Patient Instructions (Signed)
Cancer Center Discharge Instructions for Patients Receiving Chemotherapy  Today you received the following chemotherapy agents Herceptin  To help prevent nausea and vomiting after your treatment, we encourage you to take your nausea medication as directed   If you develop nausea and vomiting that is not controlled by your nausea medication, call the clinic.   BELOW ARE SYMPTOMS THAT SHOULD BE REPORTED IMMEDIATELY:  *FEVER GREATER THAN 100.5 F  *CHILLS WITH OR WITHOUT FEVER  NAUSEA AND VOMITING THAT IS NOT CONTROLLED WITH YOUR NAUSEA MEDICATION  *UNUSUAL SHORTNESS OF BREATH  *UNUSUAL BRUISING OR BLEEDING  TENDERNESS IN MOUTH AND THROAT WITH OR WITHOUT PRESENCE OF ULCERS  *URINARY PROBLEMS  *BOWEL PROBLEMS  UNUSUAL RASH Items with * indicate a potential emergency and should be followed up as soon as possible.  Feel free to call the clinic should you have any questions or concerns. The clinic phone number is [phone].  Please show the CHEMO ALERT CARD at check-in to the Emergency Department and triage nurse.   

## 2017-09-12 ENCOUNTER — Ambulatory Visit: Payer: 59

## 2017-09-17 ENCOUNTER — Inpatient Hospital Stay: Payer: 59 | Attending: Hematology

## 2017-09-17 VITALS — BP 151/89 | HR 80 | Temp 99.2°F | Resp 16

## 2017-09-17 DIAGNOSIS — C7800 Secondary malignant neoplasm of unspecified lung: Secondary | ICD-10-CM | POA: Insufficient documentation

## 2017-09-17 DIAGNOSIS — C50911 Malignant neoplasm of unspecified site of right female breast: Secondary | ICD-10-CM | POA: Diagnosis present

## 2017-09-17 DIAGNOSIS — Z5112 Encounter for antineoplastic immunotherapy: Secondary | ICD-10-CM | POA: Insufficient documentation

## 2017-09-17 MED ORDER — TRASTUZUMAB CHEMO 150 MG IV SOLR
600.0000 mg | Freq: Once | INTRAVENOUS | Status: AC
  Administered 2017-09-17: 600 mg via INTRAVENOUS
  Filled 2017-09-17: qty 28.57

## 2017-09-17 MED ORDER — SODIUM CHLORIDE 0.9 % IV SOLN
Freq: Once | INTRAVENOUS | Status: AC
  Administered 2017-09-17: 16:00:00 via INTRAVENOUS

## 2017-09-17 MED ORDER — ACETAMINOPHEN 325 MG PO TABS: 650.0000 mg | ORAL_TABLET | Freq: Once | ORAL | Status: DC

## 2017-09-17 MED ORDER — HEPARIN SOD (PORK) LOCK FLUSH 100 UNIT/ML IV SOLN
500.0000 [IU] | Freq: Once | INTRAVENOUS | Status: AC | PRN
  Administered 2017-09-17: 500 [IU]
  Filled 2017-09-17: qty 5

## 2017-09-17 MED ORDER — ACETAMINOPHEN 325 MG PO TABS
ORAL_TABLET | ORAL | Status: AC
  Filled 2017-09-17: qty 2

## 2017-09-17 MED ORDER — SODIUM CHLORIDE 0.9 % IJ SOLN
10.0000 mL | INTRAMUSCULAR | Status: DC | PRN
  Administered 2017-09-17: 10 mL
  Filled 2017-09-17: qty 10

## 2017-09-17 NOTE — Patient Instructions (Signed)
[facility] Discharge Instructions for Patients Receiving Chemotherapy  Today you received the following chemotherapy agents Herceptin  To help prevent nausea and vomiting after your treatment, we encourage you to take your nausea medication as directed   If you develop nausea and vomiting that is not controlled by your nausea medication, call the clinic.   BELOW ARE SYMPTOMS THAT SHOULD BE REPORTED IMMEDIATELY:  *FEVER GREATER THAN 100.5 F  *CHILLS WITH OR WITHOUT FEVER  NAUSEA AND VOMITING THAT IS NOT CONTROLLED WITH YOUR NAUSEA MEDICATION  *UNUSUAL SHORTNESS OF BREATH  *UNUSUAL BRUISING OR BLEEDING  TENDERNESS IN MOUTH AND THROAT WITH OR WITHOUT PRESENCE OF ULCERS  *URINARY PROBLEMS  *BOWEL PROBLEMS  UNUSUAL RASH Items with * indicate a potential emergency and should be followed up as soon as possible.  Feel free to call the clinic should you have any questions or concerns. The clinic phone number is [phone].  Please show the CHEMO ALERT CARD at check-in to the Emergency Department and triage nurse.   

## 2017-09-24 ENCOUNTER — Encounter (HOSPITAL_BASED_OUTPATIENT_CLINIC_OR_DEPARTMENT_OTHER): Payer: Self-pay | Admitting: *Deleted

## 2017-09-24 ENCOUNTER — Other Ambulatory Visit: Payer: Self-pay

## 2017-09-24 ENCOUNTER — Emergency Department (HOSPITAL_BASED_OUTPATIENT_CLINIC_OR_DEPARTMENT_OTHER)
Admission: EM | Admit: 2017-09-24 | Discharge: 2017-09-24 | Disposition: A | Discharge status: Home / Self Care | Payer: 59 | Attending: Emergency Medicine | Admitting: Emergency Medicine

## 2017-09-24 ENCOUNTER — Telehealth: Payer: Self-pay | Admitting: *Deleted

## 2017-09-24 DIAGNOSIS — J018 Other acute sinusitis: Secondary | ICD-10-CM | POA: Insufficient documentation

## 2017-09-24 DIAGNOSIS — R030 Elevated blood-pressure reading, without diagnosis of hypertension: Secondary | ICD-10-CM | POA: Insufficient documentation

## 2017-09-24 DIAGNOSIS — R6883 Chills (without fever): Secondary | ICD-10-CM | POA: Diagnosis not present

## 2017-09-24 DIAGNOSIS — Z853 Personal history of malignant neoplasm of breast: Secondary | ICD-10-CM | POA: Diagnosis not present

## 2017-09-24 DIAGNOSIS — R52 Pain, unspecified: Secondary | ICD-10-CM | POA: Diagnosis present

## 2017-09-24 DIAGNOSIS — Z79899 Other long term (current) drug therapy: Secondary | ICD-10-CM | POA: Diagnosis not present

## 2017-09-24 MED ORDER — AMOXICILLIN-POT CLAVULANATE 400-57 MG/5ML PO SUSR: 400.0000 mg | Freq: Three times a day (TID) | ORAL | 0 refills | Status: AC

## 2017-09-24 MED ORDER — AMOXICILLIN-POT CLAVULANATE 875-125 MG PO TABS: 1.0000 | ORAL_TABLET | Freq: Two times a day (BID) | ORAL | 0 refills | Status: DC

## 2017-09-24 MED ORDER — AMOXICILLIN-POT CLAVULANATE 875-125 MG PO TABS
1.0000 | ORAL_TABLET | Freq: Once | ORAL | Status: AC
  Administered 2017-09-24: 1 via ORAL
  Filled 2017-09-24: qty 1

## 2017-09-24 NOTE — ED Triage Notes (Signed)
Body aches, chills, slight cough and sinus drainage for 3 weeks. She has been taking Zyrtec, Ibuprofen and Nyquil with no relief. Worse at night.

## 2017-09-24 NOTE — Telephone Encounter (Signed)
Received vm call from pt asking to come in to see PA or RN.  She reports sinus issues & just not feeling well.  Returned call & she denies fever although she had low grade fever with last treatment.  She states she has been dealing with this off & on x 3 weeks & just can't shake it.  She reports that her chest hurts & stated "dollar coin size area L chest near port that hurts"  She states her port looks OK.  She states she is wrapping up work & will just go to [facility] ED on [address].  Informed to call us if any concerns & let us know what they suggest.  Reported to Dr Burr Medico & she agreed with pt's plan.

## 2017-09-24 NOTE — ED Provider Notes (Signed)
[facility] EMERGENCY DEPARTMENT Provider Note   CSN: [id number] Arrival date & time: 09/24/17  1631     History   Chief Complaint Chief Complaint  Patient presents with  . Chills  . Generalized Body Aches    HPI [name] is a 47 y.o. female.  Complains of chills, pressure in her face and left ear onset 3 weeks ago.  She feels worse in the late afternoon and evenings.  Treated with Zyrtec and ibuprofen without relief.  Unknown if she has had a fever.  No shortness of breath.  She has had a minimal cough, she also feels some drainage in her throat no other associated symptoms.    HPI  Past Medical History:  Diagnosis Date  . Breast cancer ([place])   . Breast cancer ([place])   . GERD (gastroesophageal reflux disease)    during pregnancy   . Pneumonia    hx of pneumonia 08/2013   . S/P radiation therapy 05/03/2015 through 06/14/2015    Right breast 4680 cGy in 26 sessions, right breast boost 1000 cGy in 5 sessions. Right supraclavicular/axillary region 4680 cGy with a supplemental PA field to bring the axillary dose up to 4500 cGy in 26 sessions  Patient reports she is cancer free presently  Patient Active Problem List   Diagnosis Date Noted  . Right arm pain 04/09/2017  . Sinusitis 07/03/2016  . Candidiasis of breast 04/05/2016  . Eczema 04/05/2016  . Port catheter in place 12/06/2015  . Seasonal allergies 10/04/2015  . 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 Breast cancer 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 ([place]) 07/22/2014  . Family history of breast cancer 07/22/2014  . 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 BREAST LUMPECTOMY WITH RADIOACTIVE SEED AND SENTINEL LYMPH NODE BIOPSY Right 03/29/2015   Procedure: BREAST LUMPECTOMY WITH RADIOACTIVE SEED AND SENTINEL LYMPH NODE BIOPSY;  Surgeon: Stark Klein, MD;   Location: [facility];  Service: General;  Laterality: Right;  . CESAREAN SECTION    . CHOLECYSTECTOMY    . ESSURE TUBAL LIGATION    . PORTACATH PLACEMENT Left 07/21/2014   Procedure: INSERTION PORT-A-CATH;  Surgeon: Stark Klein, MD;  Location: [facility];  Service: General;  Laterality: Left;  . WISDOM TOOTH EXTRACTION      OB History    Gravida Para Term Preterm AB Living   3 2           SAB TAB Ectopic Multiple Live Births                  Obstetric Comments   Menarche age 18, BC x 39, G2, P2, last menstrual cycle 08/15/14       Home Medications    Prior to Admission medications   Medication Sig Start Date End Date Taking? Authorizing Provider  albuterol (PROVENTIL HFA;VENTOLIN HFA) 108 (90 Base) MCG/ACT inhaler Inhale 2 puffs into the lungs every 6 (six) hours as needed for wheezing or shortness of breath. 09/13/15   [name], MD  Calcium-Phosphorus-Vitamin D (CALCIUM GUMMIES PO) Take by mouth daily.    [provider]  Camphor-Eucalyptus-Menthol (VICKS VAPORUB EX) Apply 1 application topically at bedtime. Applies under nose, throat and on chest.    [provider]  cetirizine (ZYRTEC) 10 MG tablet Take 10 mg by mouth daily as needed for allergies (allergies).     [provider]  fexofenadine (  ALLEGRA) 180 MG tablet Take 180 mg by mouth daily as needed. 12/13/15   [provider]  fluticasone Asencion Islam) 50 MCG/ACT nasal spray  08/03/15   [provider]  hydrocortisone 2.5 % cream APPLY TOPICALLY TO THE AFFECTED AREA TWICE DAILY 12/07/14   [name], MD  ibuprofen (ADVIL,MOTRIN) 800 MG tablet Take 1 tablet (800 mg total) by mouth every 8 (eight) hours as needed. 04/08/17   Dene Gentry, MD  lidocaine-prilocaine (EMLA) cream APPLY TOPICALLY TO THE AFFECTED AREA AS NEEDED 08/01/17   [name], MD  loperamide (IMODIUM A-D) 2 MG tablet Take 1 tablet (2 mg total) by mouth 4 (four) times daily as needed for diarrhea or loose stools.  09/21/14   [name], MD  LORazepam (ATIVAN) 0.5 MG tablet Take 1 tablet (0.5 mg total) by mouth every 8 (eight) hours. 05/22/16   [name], MD  metaxalone (SKELAXIN) 800 MG tablet Take 1 tablet (800 mg total) by mouth 3 (three) times daily as needed for muscle spasms. 04/08/17   Hudnall, Sharyn Lull, MD  Multiple Vitamins-Minerals (AIRBORNE PO) Take 1 tablet by mouth daily.    [provider]  Multiple Vitamins-Minerals (MULTIVITAMIN GUMMIES ADULT PO) Take 1 each by mouth daily. Women's Vitafusion gummie    [provider]  nystatin (MYCOSTATIN/NYSTOP) powder Apply topically 4 (four) times daily. 08/27/17   Heath Lark, MD  nystatin (NYSTATIN) powder Apply topically 2 (two) times daily. 04/10/16   [name], MD  triamcinolone (KENALOG) 0.025 % ointment Apply 1 application topically 2 (two) times daily. 05/15/17   [name], MD    Family History Family History  Problem Relation Age of Onset  . Breast cancer Mother 30  . Liver cancer Father   . Breast cancer Maternal Aunt 71  . Breast cancer Maternal Aunt 5  . Prostate cancer Maternal Grandfather 62  . Liver cancer Paternal Grandmother   . Prostate cancer Paternal Grandfather   . Prostate cancer Maternal Uncle 73  . Prostate cancer Maternal Uncle 68  . Breast cancer Cousin 78       mat first cousin    Social History Social History   Tobacco Use  . Smoking status: Never Smoker  . Smokeless tobacco: Never Used  Substance Use Topics  . Alcohol use: Yes    Comment: 2-3 drinks per week   . Drug use: No     Allergies   Adhesive [tape]; Aspirin; and Codeine   Review of Systems Review of Systems  Constitutional: Negative.   HENT: Positive for congestion, ear pain and sinus pain.   Respiratory: Positive for cough.        Cough minimal  Cardiovascular: Negative.   Gastrointestinal: Negative.   Musculoskeletal: Negative.   Skin: Negative.   Neurological: Negative.   Psychiatric/Behavioral: Negative.   All other  systems reviewed and are negative.    Physical Exam Updated Vital Signs BP (!) 157/72   Pulse 72   Temp 99.4 F (37.4 C) (Oral)   Resp 16   Ht 5\' 10"  (1.778 m)   Wt 103 kg (227 lb)   SpO2 100%   BMI 32.57 kg/m   Physical Exam  Constitutional: She appears well-developed and well-nourished.  HENT:  Head: Normocephalic and atraumatic.  Right Ear: External ear normal.  Left Ear: External ear normal.  Mouth/Throat: Oropharynx is clear and moist.  Nasal congestion.  Bilateral tympanic membranes normal  Eyes: Conjunctivae are normal. Pupils are equal, round, and reactive to light.  Neck: Neck supple. No tracheal deviation present. No thyromegaly present.  Cardiovascular: Normal rate and regular rhythm.  No murmur heard. Pulmonary/Chest: Effort normal and breath sounds normal.  Abdominal: Soft. Bowel sounds are normal. She exhibits no distension. There is no tenderness.  Musculoskeletal: Normal range of motion. She exhibits no edema or tenderness.  Neurological: She is alert. Coordination normal.  Skin: Skin is warm and dry. No rash noted.  Psychiatric: She has a normal mood and affect.  Nursing note and vitals reviewed.    ED Treatments / Results  Labs (all labs ordered are listed, but only abnormal results are displayed) Labs Reviewed - No data to display  EKG  EKG Interpretation None       Radiology No results found.  Procedures Procedures (including critical care time)  Medications Ordered in ED Medications - No data to display   Initial Impression / Assessment and Plan / ED Course  I have reviewed the triage vital signs and the nursing notes.  Pertinent labs & imaging results that were available during my care of the patient were reviewed by me and considered in my medical decision making (see chart for details). Clinically patient with sinusitis.  In light of longevity of symptoms we will treat with antibiotic.  Plan prescription Augmentin Tylenol for  aches.  Discontinue ibuprofen prescription Augmentin.  Blood pressure recheck 1-2 weeks.  Patient should also be rechecked if not feeling better in 1-2 weeks.  She has no primary care physician presently.  She will be referred to primary care      Final Clinical Impressions(s) / ED Diagnoses   #1 acute sinusitis #2 elevated blood pressure Final diagnoses:  None    ED Discharge Orders    None       Orlie Dakin, MD 09/24/17 1930

## 2017-09-24 NOTE — Discharge Instructions (Addendum)
Stop ibuprofen.  Take Tylenol instead as directed for aches.  Continue Zyrtec.  Take the antibiotic as prescribed you can call the number on these instructions to get a primary care physician.  Your blood pressure should be rechecked within the next 1 or 2 weeks.  Today's was elevated at 164/88.  Return if concern for any reason

## 2017-09-24 NOTE — ED Notes (Signed)
Pt discharged to home with family. NAD.  

## 2017-10-03 ENCOUNTER — Other Ambulatory Visit: Payer: 59

## 2017-10-03 ENCOUNTER — Ambulatory Visit: Payer: 59

## 2017-10-03 ENCOUNTER — Ambulatory Visit: Payer: 59 | Admitting: Hematology

## 2017-10-07 NOTE — Progress Notes (Signed)
[facility] Follow up note   Patient Care Team: System, Pcp Not In as PCP - [place], [place], DO (Family Medicine) Holley Bouche, NP as Nurse Practitioner (Nurse Practitioner) Stark Klein, MD as Consulting Physician (General Surgery) Arloa Koh, MD as Consulting Physician (Radiation Oncology) [name], MD as Consulting Physician (Hematology)   Date of Service:  10/09/2017    CHIEF COMPLAINTS Follow up metastatic breast cancer  Oncology History   Breast cancer 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   Staging form: Breast, AJCC 7th Edition     Clinical stage from 07/22/2014: Stage IV (T3, N1, M1) - Unsigned       Breast cancer 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 ([place])   07/02/2014 Mammogram    Mammogram showed a 2cm right beast mass and a 1.8cm right axillary node. MRI breast on 07/16/2014 showed 7cm R breast lesion and 4.4cm r axillary node       07/02/2014 Imaging    CT CAP: a 4.7cm mass in LUL lung and a 2.1cm mas in RML, and a small nodule in RUL, suspecious for metastasis        07/09/2014 Initial Diagnosis    right IDA with b/l lung lesions, ER-/PR-/HER2+      07/09/2014 Initial Biopsy    US guided right breast mass and axillary node biopsy showed IDA, and DCIS, ER-/PR-/HER2+      07/26/2014 Pathologic Stage    Left lung mass by IR, path revealed high grade carcinoma, morphology similar to breast tumor biopsy, TTF(-), NapsinA(-), ER(-)      08/04/2014 - 03/22/2015 Chemotherapy    weekly Paclitaxel 4m/m2, trastuzumab and pertuzumab every 3 weeks      08/30/2014 Genetic Testing    BreastNext panel was negative. 17 genes including BRCA1, BRCA2, were negative for mutations.       10/04/2014 Imaging    Interval decrease in the right axillary lymphadenopathy. Bilateral pulmonary lesions with left hilar lymphadenopathy also markedly decreased in the interval. The left hilar lymphadenopathy has resolved.      12/20/2014 Imaging    restaging  CT showed stable disease, no new lesions       03/29/2015 Pathology Results     right breast lumpectomy showed  chemotherapy treatment effect,  a 1 mm residual tumor,   margins were widely negative, 5 sentinel lymph nodes and 2 axillary lymph nodes were negative.       03/29/2015 Surgery     right breast lumpectomy and sentinel lymph node biopsy  by Dr. BBarry Dienes     04/12/2015 -  Chemotherapy     Herceptin maintenance therapy , 6 mg/kg, every 3 weeks      05/03/2015 - 06/14/2015 Radiation Therapy    right breast adjuvant irradiation by Dr. MValere Dross      08/28/2016 Imaging    CT CAP w Contrast  IMPRESSION: 1. No acute process or evidence of metastatic disease within the chest, abdomen, or pelvis. 2. Similar to less well-defined left upper lobe density, likely scarring. No evidence of new or progressive pulmonary metastasis. 3. Right nephrolithiasis.      01/22/2017 Imaging    CT Chest W Contrast 01/22/17 IMPRESSION: 1. Stable exam. No new or progressive findings. No evidence for metastatic disease. 2. Left upper lobe architectural distortion/scarring is stable. 3. Nonobstructing right renal stone.      07/31/2017 Imaging    Bone Scan Whole Body 07/31/17  IMPRESSION: 1. No scintigraphic evidence of osseous metastatic disease. 2. Thoracolumbar scoliosis.  07/31/2017 Imaging    CT CAP W Contrast 07/31/17 IMPRESSION: 1. No findings of active or recurrent malignancy. 2. Other imaging findings of potential clinical significance: Aortic Atherosclerosis (ICD10-I70.0). Mild cardiomegaly. Postoperative and radiation therapy findings in the right chest. Nonobstructive right nephrolithiasis. Thoracolumbar scoliosis.        CURRENT THERAPY:  Herceptin maintenance therapy every 3 weeks, started on 04/12/2015  INTERVAL HISTORY:   [name] [name] returns for follow-up and for her next dose of Herceptin. She presents to the clinic today by herself. She reports she is doing  well overall. She went to the ED for acute sinusitis 2 weeks ago but she states she is doing much better now after taking Augmentin. Per pt, she believes she has yeast under her breast. She has noticed this since she started exercising and doing water aerobics. She was prescribed nystatin and used that until it was gone and she has been using applying corn starch to the area to keep it dry.   On review of systems, pt denies fever, cough, or any other complaints at this time. Pertinent positives are listed and detailed within the above HPI.   MEDICAL HISTORY:  Past Medical History:  Diagnosis Date  . Breast cancer ([place])   . Breast cancer ([place])   . GERD (gastroesophageal reflux disease)    during pregnancy   . Pneumonia    hx of pneumonia 08/2013   . S/P radiation therapy 05/03/2015 through 06/14/2015    Right breast 4680 cGy in 26 sessions, right breast boost 1000 cGy in 5 sessions. Right supraclavicular/axillary region 4680 cGy with a supplemental PA field to bring the axillary dose up to 4500 cGy in 26 sessions    SURGICAL HISTORY: Past Surgical History:  Procedure Laterality Date  . BREAST LUMPECTOMY WITH RADIOACTIVE SEED AND SENTINEL LYMPH NODE BIOPSY Right 03/29/2015   Procedure: BREAST LUMPECTOMY WITH RADIOACTIVE SEED AND SENTINEL LYMPH NODE BIOPSY;  Surgeon: Stark Klein, MD;  Location: [facility];  Service: General;  Laterality: Right;  . CESAREAN SECTION    . CHOLECYSTECTOMY    . ESSURE TUBAL LIGATION    . PORTACATH PLACEMENT Left 07/21/2014   Procedure: INSERTION PORT-A-CATH;  Surgeon: Stark Klein, MD;  Location: [facility];  Service: General;  Laterality: Left;  . WISDOM TOOTH EXTRACTION      SOCIAL HISTORY: Social History   Socioeconomic History  . Marital status: Married    Spouse name: None  . Number of children: None  . Years of education: None  . Highest education level: None  Social Needs  . Financial  resource strain: None  . Food insecurity - worry: None  . Food insecurity - inability: None  . Transportation needs - medical: None  . Transportation needs - non-medical: None  Occupational History  . None  Tobacco Use  . Smoking status: Never Smoker  . Smokeless tobacco: Never Used  Substance and Sexual Activity  . Alcohol use: Yes    Comment: 2-3 drinks per week   . Drug use: No  . Sexual activity: No    Birth control/protection: Surgical  Other Topics Concern  . None  Social History Narrative  . None    FAMILY HISTORY: Family History  Problem Relation Age of Onset  . Breast cancer Mother 35  . Liver cancer Father   . Breast cancer Maternal Aunt 77  . Breast cancer Maternal Aunt 33  . Prostate cancer Maternal Grandfather 11  . Liver cancer Paternal Grandmother   .  Prostate cancer Paternal Grandfather   . Prostate cancer Maternal Uncle 73  . Prostate cancer Maternal Uncle 68  . Breast cancer Cousin 72       mat first cousin    GENETICS: 08/30/2014 BreastNext panel was negative. 17 genes including BRCA1, BRCA2, were negative for mutations.   ALLERGIES:  is allergic to adhesive [tape]; aspirin; and codeine.  MEDICATIONS:  Current Outpatient Medications  Medication Sig Dispense Refill  . Calcium-Phosphorus-Vitamin D (CALCIUM GUMMIES PO) Take by mouth daily.    . Camphor-Eucalyptus-Menthol (VICKS VAPORUB EX) Apply 1 application topically at bedtime. Applies under nose, throat and on chest.    . cetirizine (ZYRTEC) 10 MG tablet Take 10 mg by mouth daily as needed for allergies (allergies).     . hydrocortisone 2.5 % cream APPLY TOPICALLY TO THE AFFECTED AREA TWICE DAILY 454 g 0  . ibuprofen (ADVIL,MOTRIN) 800 MG tablet Take 1 tablet (800 mg total) by mouth every 8 (eight) hours as needed. 60 tablet 1  . lidocaine-prilocaine (EMLA) cream APPLY TOPICALLY TO THE AFFECTED AREA AS NEEDED 30 g 2  . loperamide (IMODIUM A-D) 2 MG tablet Take 1 tablet (2 mg total) by mouth 4  (four) times daily as needed for diarrhea or loose stools. 30 tablet 0  . metaxalone (SKELAXIN) 800 MG tablet Take 1 tablet (800 mg total) by mouth 3 (three) times daily as needed for muscle spasms. 60 tablet 1  . Multiple Vitamins-Minerals (AIRBORNE PO) Take 1 tablet by mouth daily.    . Multiple Vitamins-Minerals (MULTIVITAMIN GUMMIES ADULT PO) Take 1 each by mouth daily. Women's Vitafusion gummie    . triamcinolone (KENALOG) 0.025 % ointment Apply 1 application topically 2 (two) times daily. 30 g 1   No current facility-administered medications for this visit.    Facility-Administered Medications Ordered in Other Visits  Medication Dose Route Frequency Provider Last Rate Last Dose  . acetaminophen (TYLENOL) tablet 650 mg  650 mg Oral Once [name], MD      . sodium chloride 0.9 % injection 10 mL  10 mL Intracatheter PRN [name], MD   10 mL at 05/01/16 1622  . sodium chloride 0.9 % injection 10 mL  10 mL Intravenous PRN [name], MD        REVIEW OF SYSTEMS:   Constitutional: Denies fevers, chills or abnormal night sweats  Eyes: Denies blurriness of vision, double vision or watery eyes Ears, nose, mouth, throat, and face: Denies mucositis or sore throat Respiratory: Occasional dry cough, no dyspnea or wheezes Cardiovascular: Denies palpitation, chest discomfort or lower extremity swelling Gastrointestinal:  Denies nausea, heartburn or change in bowel habits Skin: negative Lymphatics: Denies new lymphadenopathy or easy bruising MSK: negative Neurological:Denies numbness, new weaknesses  Behavioral/Psych: Mood is stable, no new changes  Breast: (+) moisture under breasts  All other systems were reviewed with the patient and are negative.  PHYSICAL EXAMINATION: ECOG PERFORMANCE STATUS: 0  Vitals:   10/09/17 1359  BP: (!) 150/82  Pulse: 92  Resp: 19  Temp: 98.5 F (36.9 C)  TempSrc: Oral  SpO2: 99%  Weight: 234 lb 3.2 oz (106.2 kg)  Height: 5' 10"  (1.778 m)      GENERAL:alert, no distress and comfortable SKIN: skin color, texture, turgor are normal, no rashes or significant lesions EYES: normal, conjunctiva are pink and non-injected, sclera clear OROPHARYNX:no exudate, no erythema and lips, buccal mucosa, and tongue normal.  NECK: supple, thyroid normal size, non-tender, without nodularity LYMPH:  no palpable lymphadenopathy in  the cervical, axillary or inguinal LUNGS: clear to auscultation and percussion with normal breathing effort HEART: regular rate & rhythm and no murmurs and no lower extremity edema ABDOMEN:abdomen soft, non-tender and normal bowel sounds Musculoskeletal:no cyanosis of digits and no clubbing, no tenderness over her right shoulder or spine  PSYCH: alert & oriented x 3 with fluent speech NEURO: no focal motor/sensory deficits Breasts:  Previously, status post right breast lumpectomy and sentinel lymph node biopsy, incisions have healed well. The previous palpable seroma has resolved. (+) mild skin pigmentation of right breast secondary to radiation. Palpation of both breasts  and axillas revealed no other obvious mass that I could appreciate.  Skin: No skin rashes.   LABORATORY DATA:  I have reviewed the data as listed CBC Latest Ref Rng & Units 10/09/2017 08/01/2017 06/25/2017  WBC 3.9 - 10.3 K/uL 8.0 6.7 6.6  Hemoglobin 11.6 - 15.9 g/dL 11.9 11.9 11.8  Hematocrit 34.8 - 46.6 % 36.4 36.3 36.4  Platelets 145 - 400 K/uL 237 219 203    CMP Latest Ref Rng & Units 10/09/2017 08/01/2017 06/25/2017  Glucose 70 - 140 mg/dL 112 108 113  BUN 7 - 26 mg/dL 12 10.5 12.5  Creatinine 0.60 - 1.10 mg/dL 0.86 0.9 0.9  Sodium 136 - 145 mmol/L 137 141 141  Potassium 3.5 - 5.1 mmol/L 3.5 3.6 4.0  Chloride 98 - 109 mmol/L 103 - -  CO2 22 - 29 mmol/L 25 27 25   Calcium 8.4 - 10.4 mg/dL 9.7 9.4 8.9  Total Protein 6.4 - 8.3 g/dL 7.5 7.6 7.4  Total Bilirubin 0.2 - 1.2 mg/dL 0.5 0.25 0.26  Alkaline Phos 40 - 150 U/L 76 73 73  AST 5 - 34 U/L  19 21 23   ALT 0 - 55 U/L 22 26 28     PATHOLOGY REPORT 07/09/2014 #1 breast, right needle core biopsy more anterior -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 45)  Lung, biopsy, Left 07/26/2014 - HIGH GRADE CARCINOMA, SEE COMMENT. Microscopic Comment The carcinoma demonstrates the following immunophenotype: TTF-1 - negative expression. Napsin A- negative expression. CK5/6 - focal, moderate expression. estrogen receptor - negative expression. GCDFP- negative expression. The recent breast biopsy demonstrating Her2 amplified high grade invasive mammary carcinoma is noted ([id number]). Although the immunophenotype of the current case is non-sepcific, it strongly argues against primary lung adenocarcinoma. However, on re-review, the morphology of the current case is essentially identical to the primary mammary carcinoma. In lieu of further immunophenotyping, the tumor will be submitted for Her2 testing and the remaining tissue will be reserved for additional ancillary tumor testing. The results of the Her2 testing will be reported in an addendum. The case was discussed with Dr Burr Medico on 07/28/2015 and 07/29/2015 HER2 POSITIVE   Diagnosis 03/29/2015 1. Breast, lumpectomy, Right - INVASIVE DUCTAL CARCINOMA, SEE COMMENT. - SEE TUMOR SYNOPTIC TEMPLATE BELOW. 2. Lymph node, sentinel, biopsy, Right axillary #1 - ONE LYMPH NODE, NEGATIVE FOR TUMOR (0/1). - SEE COMMENT. 3. Lymph node, sentinel, biopsy, Right axillary #2 - BENIGN FIBROFATTY SOFT TISSUE. - NEGATIVE FOR ATYPIA OR MALIGNANCY. - NEGATIVE FOR LYMPH NODE. 4. Lymph node, sentinel, biopsy, Right axillary #3 - ONE LYMPH NODE, NEGATIVE FOR TUMOR (0/1). - SEE COMMENT. 5. Lymph node, sentinel, biopsy, Right axillary  #4 - ONE LYMPH NODE, NEGATIVE FOR TUMOR (0/1). -  SEE COMMENT. 6. Lymph node, sentinel, biopsy, Right axillary #5 - ONE LYMPH NODE, NEGATIVE FOR TUMOR (0/1). - SEE COMMENT. 7. Lymph node, sentinel, biopsy, right axillary - ONE LYMPH NODE, NEGATIVE FOR TUMOR (0/1). - SEE COMMENT. 8. Lymph node, sentinel, biopsy, right axillary - ONE LYMPH NODE, NEGATIVE FOR TUMOR (0/1). - SEE COMMENT. 1 of 4 Amended copy Amended FINAL for [name] [name], [name] [name] ([id number]) Microscopic Comment 1. BREAST, INVASIVE TUMOR, WITH LYMPH NODES PRESENT Specimen, including laterality and lymph node sampling (sentinel, non-sentinel): Right breast with sentinel and non-sentinel lymph node sampling Procedure: Lumpectomy Histologic type: Ductal, see comment Grade: 2 of 3, see comment. Tubule formation: 3 Nuclear pleomorphism: 3 Mitotic: 1 Tumor size (glass slide measurement): 1 mm, see comment Margins: Invasive, distance to closest margin: See comment In-situ, distance to closest margin: N/A If margin positive, focally or broadly: N/A Lymphovascular invasion: Absent Ductal carcinoma in situ: Absent Grade: N/A Extensive intraductal component: N/A Lobular neoplasia: Absent Tumor focality: Unifocal, see comment Treatment effect: Present If present, treatment effect in breast tissue, lymph nodes or both: Both breast tissue and lymph nodes Extent of tumor: Skin: N/A Nipple: N/A Skeletal muscle: N/A Lymph nodes: Examined: 5 Sentinel 2 Non-sentinel 7 Total Lymph nodes with metastasis: 0 Isolated tumor cells (< 0.2 mm): N/A Micrometastasis: (> 0.2 mm and < 2.0 mm): N/A Macrometastasis: (> 2.0 mm): N/A Extracapsular extension: N/A Breast prognostic profile: Estrogen receptor: Not repeated, previous study demonstrated 0% positivity ([id number]). Progesterone receptor: Not repeated, previous study demonstrated 0% positivity ([id number]) Her 2 neu: Demonstrates amplificaition ([id number]) Ki-67: Not  repeated, previous study demonstrate 79% proliferation rate ([id number]). Non-neoplastic breast: Neoadjuvant related tissue changes and adenosis. TNM: ypT78m, ypN0, pMX Comments: Numerous representative sections including the 2.0 cm hemorrhagic tissue associated with X-shaped biopsy clip, the 2.0 cm ill defined lesion associated with M-shaped clip, and intervening tissue were submitted for review. Slide sections from the tissue surrounding the X-shaped and M-shaped clips demonstrate tissue changes consistent with neoadjuvant related change. There is no invasive or in situ carcinom present at either site. However, in a representative section from the intervening tissue (slide G), there is a 1 mm focus of high grade invasive ductal carcinoma present. Given the minute size of the tumor, tumor grading is limited. The tumor is present within non-marginal tissue sections and is considered to be at least 0.5 cm from the nearest margin (medial).   Note: Amendment issued for modification of synoptic table, inadvertent typographical error. The change in the synoptic table was discussed with Dr FBurr Medicoon 04/07/2015. (CRR:ecj 04/07/2015) 2. , 4-8. In parts 2 and 4, there is extensive neoadjuvant related tissue changes including abundant foamy macrophages, fibroinflammatory reaction and dystrophic calcifications. The neoadjuvant tissue changes extend into perinodal soft tissue and are either focally or broadly present at the cauterized edge of the tissue submitted. There are no definitive features of malignancy present.   PROCEDURES  ECHO 06/20/17  Study Conclusions - Left ventricle: The cavity size was normal. Wall thickness was   normal. Systolic function was normal. The estimated ejection   fraction was in the range of 55% to 60%. Wall motion was normal;   there were no regional wall motion abnormalities. - Mitral valve: There was mild regurgitation. - Impressions: LS&' not measured well. GLS -  19.1% Impressions: - LS&' not measured well. GLS - 19.1%   ECHO 12/12/16 Study Conclusions - Left ventricle: The cavity size was normal. Wall thickness was   normal. Systolic function was normal. The estimated  ejection   fraction was in the range of 55% to 60%. Wall motion was normal;   there were no regional wall motion abnormalities. Doppler   parameters are consistent with abnormal left ventricular   relaxation (grade 1 diastolic dysfunction). Impressions: - Normal LV systolic function; mild diastolic dysfunction; trace MR   and TR; global longitudinal strain -19.7%.   RADIOGRAPHIC STUDIES:   CT CAP W Contrast 07/31/17 IMPRESSION: 1. No findings of active or recurrent malignancy. 2. Other imaging findings of potential clinical significance: Aortic Atherosclerosis (ICD10-I70.0). Mild cardiomegaly. Postoperative and radiation therapy findings in the right chest. Nonobstructive right nephrolithiasis. Thoracolumbar scoliosis.   Bone Scan Whole Body 07/31/17  IMPRESSION: 1. No scintigraphic evidence of osseous metastatic disease. 2. Thoracolumbar scoliosis.   Diagnostic Mammogram Bilateral at [facility] 07/17/17  IMPRESSION:  There is no mammographic evidence of malignancy. A follow-up mamomgram in 12 months is recommended.    CT Chest W Contrast 01/22/17 IMPRESSION: 1. Stable exam. No new or progressive findings. No evidence for metastatic disease. 2. Left upper lobe architectural distortion/scarring is stable. 3. Nonobstructing right renal stone.    ASSESSMENT & PLAN:  47 y.o.  African-American female, premenopausal, without significant past medical history, presented with palpable right breast mass and right axillary mass.   1. 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 - I previously reviewed her breasts surgical pathology results with her in great detail ,  She has had fantastic partial response (near complete response) form 8 months of chemotherapy and antibody therapy.   The initial 6.3 cm breast mass has only 1 mm residual tumor,  And the previously 4.4 cm right axillary lymph node has had complete  Pathological response, all 7 nodes were negative for cancer. -I previously reviewed her restaging CT scan from 01/22/2017, which showed no evidence of disease, the scar tissue in left upper lobe lung is unchanged. No other new lesions -Continue monitoring cardiac echo every 6 months, she will follow up with Dr. BHaroldine Laws She is scheduled for May, 2019 -We previously discussed her CT CAP and Bone scan from 07/31/17 show no evidence of recurrence or metastasis. Her 07/17/17 mammogram was normal.  -Her 06/2017 ECHO show 55-60% EF -She is clinically doing very well, lab reviewed, CBC and CMP are WNL, adequate for treatment, we'll continue Herceptin every 3 weeks, indefinitely. -She had some mild erythremia under her breasts, possible fungal skin infection, I encouraged her to continue using corn starch and to keep the area dry to avoid infection.   -I will order scan at next visit -We'll see her back in 9 weeks with labs   2. Recurrent sinusitis/URI-like symptoms -She has previously had recurrent sinusitis, and bronchitis. Her symptom has resolved lately. -Her symptoms have returned due to allergy season.  -She did have a flare up of this, including sore throat. She was placed on Azithromycin by her PCP and her symptoms have now improved as of (06/04/17). On exam, her throat appears well.  -I also previously encouraged her to rest well and drink adequate amounts of water to assist with this.   3. Mild peripheral neuropathy, G1 -Probably related to her prior chemotherapy -previously very mild, we'll continue monitoring.    5.  HTN? -No prior history of HTN, but her BP has been slightly elevated lately  -  f/u with PCP -I encourage a healthy low salt diet and exercise. She agrees and is going to start walking after work, I also strongly encouraged her to lose weight. She does not want to take medication, and is motivated to follow diet and exercise  -I previously encouraged her to check her BP at home or at a pharmacy.  -she will follow up with Dr. Haroldine Laws    6. Obesity  -I previously encouraged her to have healthy diet, exercise regularly, and try to lose some weight.   Plan  -Labs adequate to proceed with Herceptin today. Return every 3 weeks  -Lab, flush and f/u 9 weeks  -Order CT CAP W Contrast at next visit   I spent about 15 minutes counseling the patient and her family members, total care was about 20 minutes.   This document serves as a record of services personally performed by [name], MD. It was created on her behalf by Theresia Bough, a trained medical scribe. The creation of this record is based on the scribe's personal observations and the provider's statements to them.   I have reviewed the above documentation for accuracy and completeness, and I agree with the above.   [name]  10/09/2017 2:30 PM

## 2017-10-09 ENCOUNTER — Telehealth: Payer: Self-pay | Admitting: Hematology

## 2017-10-09 ENCOUNTER — Inpatient Hospital Stay: Payer: 59

## 2017-10-09 ENCOUNTER — Inpatient Hospital Stay (HOSPITAL_BASED_OUTPATIENT_CLINIC_OR_DEPARTMENT_OTHER): Payer: 59 | Admitting: Hematology

## 2017-10-09 ENCOUNTER — Encounter: Payer: Self-pay | Admitting: Hematology

## 2017-10-09 VITALS — BP 150/82 | HR 92 | Temp 98.5°F | Resp 19 | Ht 70.0 in | Wt 234.2 lb

## 2017-10-09 DIAGNOSIS — C50911 Malignant neoplasm of unspecified site of right female breast: Secondary | ICD-10-CM

## 2017-10-09 DIAGNOSIS — C7800 Secondary malignant neoplasm of unspecified lung: Principal | ICD-10-CM

## 2017-10-09 DIAGNOSIS — J329 Chronic sinusitis, unspecified: Secondary | ICD-10-CM | POA: Diagnosis not present

## 2017-10-09 DIAGNOSIS — G629 Polyneuropathy, unspecified: Secondary | ICD-10-CM | POA: Diagnosis not present

## 2017-10-09 DIAGNOSIS — Z5112 Encounter for antineoplastic immunotherapy: Secondary | ICD-10-CM | POA: Diagnosis not present

## 2017-10-09 DIAGNOSIS — Z95828 Presence of other vascular implants and grafts: Secondary | ICD-10-CM

## 2017-10-09 DIAGNOSIS — Z171 Estrogen receptor negative status [ER-]: Secondary | ICD-10-CM

## 2017-10-09 DIAGNOSIS — I10 Essential (primary) hypertension: Secondary | ICD-10-CM | POA: Diagnosis not present

## 2017-10-09 LAB — CBC WITH DIFFERENTIAL/PLATELET
BASOS ABS: 0 10*3/uL (ref 0.0–0.1)
Basophils Relative: 0 %
EOS ABS: 0.1 10*3/uL (ref 0.0–0.5)
Eosinophils Relative: 1 %
HCT: 36.4 % (ref 34.8–46.6)
HEMOGLOBIN: 11.9 g/dL (ref 11.6–15.9)
LYMPHS ABS: 2.1 10*3/uL (ref 0.9–3.3)
LYMPHS PCT: 26 %
MCH: 29.4 pg (ref 25.1–34.0)
MCHC: 32.7 g/dL (ref 31.5–36.0)
MCV: 89.9 fL (ref 79.5–101.0)
Monocytes Absolute: 0.4 10*3/uL (ref 0.1–0.9)
Monocytes Relative: 5 %
NEUTROS PCT: 68 %
Neutro Abs: 5.3 10*3/uL (ref 1.5–6.5)
Platelets: 237 10*3/uL (ref 145–400)
RBC: 4.05 MIL/uL (ref 3.70–5.45)
RDW: 13.3 % (ref 11.2–14.5)
WBC: 8 10*3/uL (ref 3.9–10.3)

## 2017-10-09 LAB — COMPREHENSIVE METABOLIC PANEL
ALT: 22 U/L (ref 0–55)
AST: 19 U/L (ref 5–34)
Albumin: 3.4 g/dL — ABNORMAL LOW (ref 3.5–5.0)
Alkaline Phosphatase: 76 U/L (ref 40–150)
Anion gap: 9 (ref 3–11)
BUN: 12 mg/dL (ref 7–26)
CHLORIDE: 103 mmol/L (ref 98–109)
CO2: 25 mmol/L (ref 22–29)
CREATININE: 0.86 mg/dL (ref 0.60–1.10)
Calcium: 9.7 mg/dL (ref 8.4–10.4)
Glucose, Bld: 112 mg/dL (ref 70–140)
POTASSIUM: 3.5 mmol/L (ref 3.5–5.1)
Sodium: 137 mmol/L (ref 136–145)
TOTAL PROTEIN: 7.5 g/dL (ref 6.4–8.3)
Total Bilirubin: 0.5 mg/dL (ref 0.2–1.2)

## 2017-10-09 MED ORDER — SODIUM CHLORIDE 0.9 % IV SOLN
600.0000 mg | Freq: Once | INTRAVENOUS | Status: AC
  Administered 2017-10-09: 600 mg via INTRAVENOUS
  Filled 2017-10-09: qty 28.57

## 2017-10-09 MED ORDER — SODIUM CHLORIDE 0.9 % IV SOLN
Freq: Once | INTRAVENOUS | Status: AC
  Administered 2017-10-09: 15:00:00 via INTRAVENOUS

## 2017-10-09 MED ORDER — SODIUM CHLORIDE 0.9 % IJ SOLN
10.0000 mL | INTRAMUSCULAR | Status: DC | PRN
  Administered 2017-10-09: 10 mL
  Filled 2017-10-09: qty 10

## 2017-10-09 MED ORDER — HEPARIN SOD (PORK) LOCK FLUSH 100 UNIT/ML IV SOLN
500.0000 [IU] | Freq: Once | INTRAVENOUS | Status: AC | PRN
  Administered 2017-10-09: 500 [IU]
  Filled 2017-10-09: qty 5

## 2017-10-09 MED ORDER — SODIUM CHLORIDE 0.9 % IJ SOLN
10.0000 mL | INTRAMUSCULAR | Status: DC | PRN
  Administered 2017-10-09: 10 mL via INTRAVENOUS
  Filled 2017-10-09: qty 10

## 2017-10-09 NOTE — Patient Instructions (Signed)
[facility] Discharge Instructions for Patients Receiving Chemotherapy Today you received the following chemotherapy agents:  Herceptin To help prevent nausea and vomiting after your treatment, we encourage you to take your nausea medication as prescribed.   If you develop nausea and vomiting that is not controlled by your nausea medication, call the clinic.   BELOW ARE SYMPTOMS THAT SHOULD BE REPORTED IMMEDIATELY:  *FEVER GREATER THAN 100.5 F  *CHILLS WITH OR WITHOUT FEVER  NAUSEA AND VOMITING THAT IS NOT CONTROLLED WITH YOUR NAUSEA MEDICATION  *UNUSUAL SHORTNESS OF BREATH  *UNUSUAL BRUISING OR BLEEDING  TENDERNESS IN MOUTH AND THROAT WITH OR WITHOUT PRESENCE OF ULCERS  *URINARY PROBLEMS  *BOWEL PROBLEMS  UNUSUAL RASH Items with * indicate a potential emergency and should be followed up as soon as possible.  Feel free to call the clinic should you have any questions or concerns. The clinic phone number is [phone].  Please show the CHEMO ALERT CARD at check-in to the Emergency Department and triage nurse.   

## 2017-10-09 NOTE — Telephone Encounter (Signed)
Scheduled appt per 2/27 los - patient to get an updated appt in the treatment area.

## 2017-10-10 LAB — CANCER ANTIGEN 27.29: CAN 27.29: 11.8 U/mL (ref 0.0–38.6)

## 2017-10-30 ENCOUNTER — Inpatient Hospital Stay: Payer: 59 | Attending: Hematology

## 2017-10-30 VITALS — BP 148/79 | HR 77 | Temp 98.8°F | Resp 18 | Wt 234.2 lb

## 2017-10-30 DIAGNOSIS — C50911 Malignant neoplasm of unspecified site of right female breast: Secondary | ICD-10-CM | POA: Diagnosis present

## 2017-10-30 DIAGNOSIS — C7800 Secondary malignant neoplasm of unspecified lung: Secondary | ICD-10-CM | POA: Insufficient documentation

## 2017-10-30 DIAGNOSIS — Z5112 Encounter for antineoplastic immunotherapy: Secondary | ICD-10-CM | POA: Diagnosis present

## 2017-10-30 MED ORDER — HEPARIN SOD (PORK) LOCK FLUSH 100 UNIT/ML IV SOLN
500.0000 [IU] | Freq: Once | INTRAVENOUS | Status: AC | PRN
  Administered 2017-10-30: 500 [IU]
  Filled 2017-10-30: qty 5

## 2017-10-30 MED ORDER — ACETAMINOPHEN 325 MG PO TABS
ORAL_TABLET | ORAL | Status: AC
  Filled 2017-10-30: qty 2

## 2017-10-30 MED ORDER — SODIUM CHLORIDE 0.9 % IV SOLN
Freq: Once | INTRAVENOUS | Status: AC
  Administered 2017-10-30: 16:00:00 via INTRAVENOUS

## 2017-10-30 MED ORDER — ACETAMINOPHEN 325 MG PO TABS: 650.0000 mg | ORAL_TABLET | Freq: Once | ORAL | Status: DC

## 2017-10-30 MED ORDER — SODIUM CHLORIDE 0.9 % IJ SOLN
10.0000 mL | INTRAMUSCULAR | Status: DC | PRN
  Administered 2017-10-30: 10 mL
  Filled 2017-10-30: qty 10

## 2017-10-30 MED ORDER — TRASTUZUMAB CHEMO 150 MG IV SOLR
600.0000 mg | Freq: Once | INTRAVENOUS | Status: AC
  Administered 2017-10-30: 600 mg via INTRAVENOUS
  Filled 2017-10-30: qty 28.57

## 2017-10-30 NOTE — Patient Instructions (Signed)
[facility] Discharge Instructions for Patients Receiving Chemotherapy Today you received the following chemotherapy agents:  Herceptin To help prevent nausea and vomiting after your treatment, we encourage you to take your nausea medication as prescribed.   If you develop nausea and vomiting that is not controlled by your nausea medication, call the clinic.   BELOW ARE SYMPTOMS THAT SHOULD BE REPORTED IMMEDIATELY:  *FEVER GREATER THAN 100.5 F  *CHILLS WITH OR WITHOUT FEVER  NAUSEA AND VOMITING THAT IS NOT CONTROLLED WITH YOUR NAUSEA MEDICATION  *UNUSUAL SHORTNESS OF BREATH  *UNUSUAL BRUISING OR BLEEDING  TENDERNESS IN MOUTH AND THROAT WITH OR WITHOUT PRESENCE OF ULCERS  *URINARY PROBLEMS  *BOWEL PROBLEMS  UNUSUAL RASH Items with * indicate a potential emergency and should be followed up as soon as possible.  Feel free to call the clinic should you have any questions or concerns. The clinic phone number is [phone].  Please show the CHEMO ALERT CARD at check-in to the Emergency Department and triage nurse.   

## 2017-11-20 ENCOUNTER — Inpatient Hospital Stay: Payer: 59 | Attending: Hematology

## 2017-11-20 VITALS — BP 169/89 | HR 83 | Temp 98.7°F | Resp 17

## 2017-11-20 DIAGNOSIS — C50911 Malignant neoplasm of unspecified site of right female breast: Secondary | ICD-10-CM | POA: Insufficient documentation

## 2017-11-20 DIAGNOSIS — Z5112 Encounter for antineoplastic immunotherapy: Secondary | ICD-10-CM | POA: Insufficient documentation

## 2017-11-20 DIAGNOSIS — C7800 Secondary malignant neoplasm of unspecified lung: Secondary | ICD-10-CM | POA: Diagnosis present

## 2017-11-20 MED ORDER — ACETAMINOPHEN 325 MG PO TABS: 650.0000 mg | ORAL_TABLET | Freq: Once | ORAL | Status: DC

## 2017-11-20 MED ORDER — TRASTUZUMAB CHEMO 150 MG IV SOLR
600.0000 mg | Freq: Once | INTRAVENOUS | Status: AC
  Administered 2017-11-20: 600 mg via INTRAVENOUS
  Filled 2017-11-20: qty 28.57

## 2017-11-20 MED ORDER — SODIUM CHLORIDE 0.9 % IV SOLN
Freq: Once | INTRAVENOUS | Status: AC
  Administered 2017-11-20: 16:00:00 via INTRAVENOUS

## 2017-11-20 MED ORDER — SODIUM CHLORIDE 0.9 % IJ SOLN
10.0000 mL | INTRAMUSCULAR | Status: DC | PRN
  Administered 2017-11-20: 10 mL
  Filled 2017-11-20: qty 10

## 2017-11-20 MED ORDER — HEPARIN SOD (PORK) LOCK FLUSH 100 UNIT/ML IV SOLN
500.0000 [IU] | Freq: Once | INTRAVENOUS | Status: AC | PRN
  Administered 2017-11-20: 500 [IU]
  Filled 2017-11-20: qty 5

## 2017-11-20 NOTE — Patient Instructions (Signed)
[facility] Discharge Instructions for Patients Receiving Chemotherapy Today you received the following chemotherapy agents:  Herceptin To help prevent nausea and vomiting after your treatment, we encourage you to take your nausea medication as prescribed.   If you develop nausea and vomiting that is not controlled by your nausea medication, call the clinic.   BELOW ARE SYMPTOMS THAT SHOULD BE REPORTED IMMEDIATELY:  *FEVER GREATER THAN 100.5 F  *CHILLS WITH OR WITHOUT FEVER  NAUSEA AND VOMITING THAT IS NOT CONTROLLED WITH YOUR NAUSEA MEDICATION  *UNUSUAL SHORTNESS OF BREATH  *UNUSUAL BRUISING OR BLEEDING  TENDERNESS IN MOUTH AND THROAT WITH OR WITHOUT PRESENCE OF ULCERS  *URINARY PROBLEMS  *BOWEL PROBLEMS  UNUSUAL RASH Items with * indicate a potential emergency and should be followed up as soon as possible.  Feel free to call the clinic should you have any questions or concerns. The clinic phone number is [phone].  Please show the CHEMO ALERT CARD at check-in to the Emergency Department and triage nurse.   

## 2017-12-09 NOTE — Progress Notes (Signed)
[facility] Follow up note   Patient Care Team: System, Pcp Not In as PCP - [place], [place], DO (Family Medicine) Holley Bouche, NP as Nurse Practitioner (Nurse Practitioner) Stark Klein, MD as Consulting Physician (General Surgery) Arloa Koh, MD as Consulting Physician (Radiation Oncology) [name], MD as Consulting Physician (Hematology)   Date of Service:  12/11/2017    CHIEF COMPLAINTS Follow up metastatic breast cancer  Oncology History   Breast cancer 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   Staging form: Breast, AJCC 7th Edition     Clinical stage from 07/22/2014: Stage IV (T3, N1, M1) - Unsigned       Breast cancer 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 ([place])   07/02/2014 Mammogram    Mammogram showed a 2cm right beast mass and a 1.8cm right axillary node. MRI breast on 07/16/2014 showed 7cm R breast lesion and 4.4cm r axillary node       07/02/2014 Imaging    CT CAP: a 4.7cm mass in LUL lung and a 2.1cm mas in RML, and a small nodule in RUL, suspecious for metastasis        07/09/2014 Initial Diagnosis    right IDA with b/l lung lesions, ER-/PR-/HER2+      07/09/2014 Initial Biopsy    US guided right breast mass and axillary node biopsy showed IDA, and DCIS, ER-/PR-/HER2+      07/26/2014 Pathologic Stage    Left lung mass by IR, path revealed high grade carcinoma, morphology similar to breast tumor biopsy, TTF(-), NapsinA(-), ER(-)      08/04/2014 - 03/22/2015 Chemotherapy    weekly Paclitaxel 56m/m2, trastuzumab and pertuzumab every 3 weeks      08/30/2014 Genetic Testing    BreastNext panel was negative. 17 genes including BRCA1, BRCA2, were negative for mutations.       10/04/2014 Imaging    Interval decrease in the right axillary lymphadenopathy. Bilateral pulmonary lesions with left hilar lymphadenopathy also markedly decreased in the interval. The left hilar lymphadenopathy has resolved.      12/20/2014 Imaging    restaging  CT showed stable disease, no new lesions       03/29/2015 Pathology Results     right breast lumpectomy showed  chemotherapy treatment effect,  a 1 mm residual tumor,   margins were widely negative, 5 sentinel lymph nodes and 2 axillary lymph nodes were negative.       03/29/2015 Surgery     right breast lumpectomy and sentinel lymph node biopsy  by Dr. BBarry Dienes     04/12/2015 -  Chemotherapy     Herceptin maintenance therapy , 6 mg/kg, every 3 weeks      05/03/2015 - 06/14/2015 Radiation Therapy    right breast adjuvant irradiation by Dr. MValere Dross      08/28/2016 Imaging    CT CAP w Contrast  IMPRESSION: 1. No acute process or evidence of metastatic disease within the chest, abdomen, or pelvis. 2. Similar to less well-defined left upper lobe density, likely scarring. No evidence of new or progressive pulmonary metastasis. 3. Right nephrolithiasis.      01/22/2017 Imaging    CT Chest W Contrast 01/22/17 IMPRESSION: 1. Stable exam. No new or progressive findings. No evidence for metastatic disease. 2. Left upper lobe architectural distortion/scarring is stable. 3. Nonobstructing right renal stone.      07/31/2017 Imaging    Bone Scan Whole Body 07/31/17  IMPRESSION: 1. No scintigraphic evidence of osseous metastatic disease. 2. Thoracolumbar scoliosis.  07/31/2017 Imaging    CT CAP W Contrast 07/31/17 IMPRESSION: 1. No findings of active or recurrent malignancy. 2. Other imaging findings of potential clinical significance: Aortic Atherosclerosis (ICD10-I70.0). Mild cardiomegaly. Postoperative and radiation therapy findings in the right chest. Nonobstructive right nephrolithiasis. Thoracolumbar scoliosis.        CURRENT THERAPY:  Herceptin maintenance therapy every 3 weeks, started on 04/12/2015  INTERVAL HISTORY:   [name] [name] returns for follow-up and for her next dose of Herceptin. She presents to the clinic today by herself. She reports she is doing  well overall. She states she is having some trouble with her port today, they are having trouble accessing it. She notes she is having seasonal allergies.   On review of systems, pt denies pain, or any other complaints at this time. Pertinent positives are listed and detailed within the above HPI.   MEDICAL HISTORY:  Past Medical History:  Diagnosis Date  . Breast cancer ([place])   . Breast cancer ([place])   . GERD (gastroesophageal reflux disease)    during pregnancy   . Pneumonia    hx of pneumonia 08/2013   . S/P radiation therapy 05/03/2015 through 06/14/2015    Right breast 4680 cGy in 26 sessions, right breast boost 1000 cGy in 5 sessions. Right supraclavicular/axillary region 4680 cGy with a supplemental PA field to bring the axillary dose up to 4500 cGy in 26 sessions    SURGICAL HISTORY: Past Surgical History:  Procedure Laterality Date  . BREAST LUMPECTOMY WITH RADIOACTIVE SEED AND SENTINEL LYMPH NODE BIOPSY Right 03/29/2015   Procedure: BREAST LUMPECTOMY WITH RADIOACTIVE SEED AND SENTINEL LYMPH NODE BIOPSY;  Surgeon: Stark Klein, MD;  Location: [facility];  Service: General;  Laterality: Right;  . CESAREAN SECTION    . CHOLECYSTECTOMY    . ESSURE TUBAL LIGATION    . PORTACATH PLACEMENT Left 07/21/2014   Procedure: INSERTION PORT-A-CATH;  Surgeon: Stark Klein, MD;  Location: [facility];  Service: General;  Laterality: Left;  . WISDOM TOOTH EXTRACTION      SOCIAL HISTORY: Social History   Socioeconomic History  . Marital status: Married    Spouse name: Not on file  . Number of children: Not on file  . Years of education: Not on file  . Highest education level: Not on file  Occupational History  . Not on file  Social Needs  . Financial resource strain: Not on file  . Food insecurity:    Worry: Not on file    Inability: Not on file  . Transportation needs:    Medical: Not on file    Non-medical: Not on file   Tobacco Use  . Smoking status: Never Smoker  . Smokeless tobacco: Never Used  Substance and Sexual Activity  . Alcohol use: Yes    Comment: 2-3 drinks per week   . Drug use: No  . Sexual activity: Never    Birth control/protection: Surgical  Lifestyle  . Physical activity:    Days per week: Not on file    Minutes per session: Not on file  . Stress: Not on file  Relationships  . Social connections:    Talks on phone: Not on file    Gets together: Not on file    Attends religious service: Not on file    Active member of club or organization: Not on file    Attends meetings of clubs or organizations: Not on file    Relationship status: Not on file  Other  Topics Concern  . Not on file  Social History Narrative  . Not on file    FAMILY HISTORY: Family History  Problem Relation Age of Onset  . Breast cancer Mother 31  . Liver cancer Father   . Breast cancer Maternal Aunt 2  . Breast cancer Maternal Aunt 12  . Prostate cancer Maternal Grandfather 15  . Liver cancer Paternal Grandmother   . Prostate cancer Paternal Grandfather   . Prostate cancer Maternal Uncle 73  . Prostate cancer Maternal Uncle 68  . Breast cancer Cousin 2       mat first cousin    GENETICS: 08/30/2014 BreastNext panel was negative. 17 genes including BRCA1, BRCA2, were negative for mutations.   ALLERGIES:  is allergic to adhesive [tape]; aspirin; and codeine.  MEDICATIONS:  Current Outpatient Medications  Medication Sig Dispense Refill  . Calcium-Phosphorus-Vitamin D (CALCIUM GUMMIES PO) Take by mouth daily.    . Camphor-Eucalyptus-Menthol (VICKS VAPORUB EX) Apply 1 application topically at bedtime. Applies under nose, throat and on chest.    . cetirizine (ZYRTEC) 10 MG tablet Take 10 mg by mouth daily as needed for allergies (allergies).     . hydrocortisone 2.5 % cream APPLY TOPICALLY TO THE AFFECTED AREA TWICE DAILY 454 g 0  . ibuprofen (ADVIL,MOTRIN) 800 MG tablet Take 1 tablet (800 mg  total) by mouth every 8 (eight) hours as needed. 60 tablet 1  . lidocaine-prilocaine (EMLA) cream APPLY TOPICALLY TO THE AFFECTED AREA AS NEEDED 30 g 2  . loperamide (IMODIUM A-D) 2 MG tablet Take 1 tablet (2 mg total) by mouth 4 (four) times daily as needed for diarrhea or loose stools. 30 tablet 0  . metaxalone (SKELAXIN) 800 MG tablet Take 1 tablet (800 mg total) by mouth 3 (three) times daily as needed for muscle spasms. 60 tablet 1  . Multiple Vitamins-Minerals (AIRBORNE PO) Take 1 tablet by mouth daily.    . Multiple Vitamins-Minerals (MULTIVITAMIN GUMMIES ADULT PO) Take 1 each by mouth daily. Women's Vitafusion gummie    . triamcinolone (KENALOG) 0.025 % ointment Apply 1 application topically 2 (two) times daily. 30 g 1   No current facility-administered medications for this visit.    Facility-Administered Medications Ordered in Other Visits  Medication Dose Route Frequency Provider Last Rate Last Dose  . acetaminophen (TYLENOL) tablet 650 mg  650 mg Oral Once [name], MD      . acetaminophen (TYLENOL) tablet 650 mg  650 mg Oral Once [name], MD      . sodium chloride 0.9 % injection 10 mL  10 mL Intracatheter PRN [name], MD   10 mL at 05/01/16 1622  . sodium chloride 0.9 % injection 10 mL  10 mL Intravenous PRN [name], MD      . sodium chloride 0.9 % injection 10 mL  10 mL Intracatheter PRN [name], MD   10 mL at 12/11/17 1556    REVIEW OF SYSTEMS:   Constitutional: Denies fevers, chills or abnormal night sweats  Eyes: Denies blurriness of vision, double vision or watery eyes Ears, nose, mouth, throat, and face: Denies mucositis or sore throat (+) seasonal allergies Respiratory: Occasional dry cough, no dyspnea or wheezes Cardiovascular: Denies palpitation, chest discomfort or lower extremity swelling Gastrointestinal:  Denies nausea, heartburn or change in bowel habits Skin: negative Lymphatics: Denies new lymphadenopathy or easy bruising MSK:  negative Neurological:Denies numbness, new weaknesses  Behavioral/Psych: Mood is stable, no new changes  All other systems were reviewed  with the patient and are negative.  PHYSICAL EXAMINATION: ECOG PERFORMANCE STATUS: 0  Vitals:   12/11/17 1332  BP: (!) 144/80  Pulse: 96  Resp: 18  Temp: 98.6 F (37 C)  TempSrc: Oral  SpO2: 98%  Weight: 233 lb 12.8 oz (106.1 kg)  Height: 5' 10" (1.778 m)     GENERAL:alert, no distress and comfortable SKIN: skin color, texture, turgor are normal, no rashes or significant lesions EYES: normal, conjunctiva are pink and non-injected, sclera clear OROPHARYNX:no exudate, no erythema and lips, buccal mucosa, and tongue normal.  NECK: supple, thyroid normal size, non-tender, without nodularity LYMPH:  no palpable lymphadenopathy in the cervical, axillary or inguinal LUNGS: clear to auscultation and percussion with normal breathing effort HEART: regular rate & rhythm and no murmurs and no lower extremity edema ABDOMEN:abdomen soft, non-tender and normal bowel sounds Musculoskeletal:no cyanosis of digits and no clubbing, no tenderness over her right shoulder or spine  PSYCH: alert & oriented x 3 with fluent speech NEURO: no focal motor/sensory deficits Breasts:  Previously, status post right breast lumpectomy and sentinel lymph node biopsy, incisions have healed well. The previous palpable seroma has resolved. (+) mild skin pigmentation of right breast secondary to radiation. Palpation of both breasts  and axillas revealed no other obvious mass that I could appreciate.   LABORATORY DATA:  I have reviewed the data as listed CBC Latest Ref Rng & Units 12/11/2017 10/09/2017 08/01/2017  WBC 3.9 - 10.3 K/uL 8.7 8.0 6.7  Hemoglobin 11.6 - 15.9 g/dL 12.1 11.9 11.9  Hematocrit 34.8 - 46.6 % 36.3 36.4 36.3  Platelets 145 - 400 K/uL 230 237 219    CMP Latest Ref Rng & Units 12/11/2017 10/09/2017 08/01/2017  Glucose 70 - 140 mg/dL 97 112 108  BUN 7 - 26 mg/dL 15  12 10.5  Creatinine 0.60 - 1.10 mg/dL 0.97 0.86 0.9  Sodium 136 - 145 mmol/L 140 137 141  Potassium 3.5 - 5.1 mmol/L 3.9 3.5 3.6  Chloride 98 - 109 mmol/L 105 103 -  CO2 22 - 29 mmol/L _0 Calcium 8.4 - 10.4 mg/dL 9.9 9.7 9.4  Total Protein 6.4 - 8.3 g/dL 8.1 7.5 7.6  Total Bilirubin 0.2 - 1.2 mg/dL 0.3 0.5 0.25  Alkaline Phos 40 - 150 U/L 82 76 73  AST 5 - 34 U/L _1 ALT 0 - 55 U/L _2 PATHOLOGY REPORT 07/09/2014 #1 breast, right needle core biopsy more anterior -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 45)  Lung, biopsy, Left 07/26/2014 - HIGH GRADE CARCINOMA, SEE COMMENT. Microscopic Comment The carcinoma demonstrates the following immunophenotype: TTF-1 - negative expression. Napsin A- negative expression. CK5/6 - focal, moderate expression. estrogen receptor - negative expression. GCDFP- negative expression. The recent breast biopsy demonstrating Her2 amplified high grade invasive mammary carcinoma is noted ([id number]). Although the immunophenotype of the current case is non-sepcific, it strongly argues against primary lung adenocarcinoma. However, on re-review, the morphology of the current case is essentially identical to the primary mammary carcinoma. In lieu of further immunophenotyping, the tumor will be submitted for Her2 testing and the remaining tissue will be reserved for additional ancillary tumor testing. The results of the Her2 testing will be reported in an addendum. The case was discussed with Dr Burr Medico  on 07/28/2015 and 07/29/2015 HER2 POSITIVE   Diagnosis 03/29/2015 1. Breast, lumpectomy, Right - INVASIVE DUCTAL CARCINOMA, SEE COMMENT. - SEE TUMOR SYNOPTIC TEMPLATE BELOW. 2. Lymph node, sentinel, biopsy, Right  axillary #1 - ONE LYMPH NODE, NEGATIVE FOR TUMOR (0/1). - SEE COMMENT. 3. Lymph node, sentinel, biopsy, Right axillary #2 - BENIGN FIBROFATTY SOFT TISSUE. - NEGATIVE FOR ATYPIA OR MALIGNANCY. - NEGATIVE FOR LYMPH NODE. 4. Lymph node, sentinel, biopsy, Right axillary #3 - ONE LYMPH NODE, NEGATIVE FOR TUMOR (0/1). - SEE COMMENT. 5. Lymph node, sentinel, biopsy, Right axillary #4 - ONE LYMPH NODE, NEGATIVE FOR TUMOR (0/1). - SEE COMMENT. 6. Lymph node, sentinel, biopsy, Right axillary #5 - ONE LYMPH NODE, NEGATIVE FOR TUMOR (0/1). - SEE COMMENT. 7. Lymph node, sentinel, biopsy, right axillary - ONE LYMPH NODE, NEGATIVE FOR TUMOR (0/1). - SEE COMMENT. 8. Lymph node, sentinel, biopsy, right axillary - ONE LYMPH NODE, NEGATIVE FOR TUMOR (0/1). - SEE COMMENT. 1 of 4 Amended copy Amended FINAL for ADREAN, FINDLAY ([id number]) Microscopic Comment 1. BREAST, INVASIVE TUMOR, WITH LYMPH NODES PRESENT Specimen, including laterality and lymph node sampling (sentinel, non-sentinel): Right breast with sentinel and non-sentinel lymph node sampling Procedure: Lumpectomy Histologic type: Ductal, see comment Grade: 2 of 3, see comment. Tubule formation: 3 Nuclear pleomorphism: 3 Mitotic: 1 Tumor size (glass slide measurement): 1 mm, see comment Margins: Invasive, distance to closest margin: See comment In-situ, distance to closest margin: N/A If margin positive, focally or broadly: N/A Lymphovascular invasion: Absent Ductal carcinoma in situ: Absent Grade: N/A Extensive intraductal component: N/A Lobular neoplasia: Absent Tumor focality: Unifocal, see comment Treatment effect: Present If present, treatment effect in breast tissue, lymph nodes or both: Both breast tissue and lymph nodes Extent of tumor: Skin: N/A Nipple: N/A Skeletal muscle: N/A Lymph nodes: Examined: 5 Sentinel 2 Non-sentinel 7 Total Lymph nodes with metastasis: 0 Isolated tumor cells (< 0.2 mm):  N/A Micrometastasis: (> 0.2 mm and < 2.0 mm): N/A Macrometastasis: (> 2.0 mm): N/A Extracapsular extension: N/A Breast prognostic profile: Estrogen receptor: Not repeated, previous study demonstrated 0% positivity ([id number]). Progesterone receptor: Not repeated, previous study demonstrated 0% positivity ([id number]) Her 2 neu: Demonstrates amplificaition ([id number]) Ki-67: Not repeated, previous study demonstrate 79% proliferation rate ([id number]). Non-neoplastic breast: Neoadjuvant related tissue changes and adenosis. TNM: ypT25m, ypN0, pMX Comments: Numerous representative sections including the 2.0 cm hemorrhagic tissue associated with X-shaped biopsy clip, the 2.0 cm ill defined lesion associated with M-shaped clip, and intervening tissue were submitted for review. Slide sections from the tissue surrounding the X-shaped and M-shaped clips demonstrate tissue changes consistent with neoadjuvant related change. There is no invasive or in situ carcinom present at either site. However, in a representative section from the intervening tissue (slide G), there is a 1 mm focus of high grade invasive ductal carcinoma present. Given the minute size of the tumor, tumor grading is limited. The tumor is present within non-marginal tissue sections and is considered to be at least 0.5 cm from the nearest margin (medial).   Note: Amendment issued for modification of synoptic table, inadvertent typographical error. The change in the synoptic table was discussed with Dr FBurr Medicoon 04/07/2015. (CRR:ecj 04/07/2015) 2. , 4-8. In parts 2 and 4, there is extensive neoadjuvant related tissue changes including abundant foamy macrophages, fibroinflammatory reaction and dystrophic calcifications. The neoadjuvant tissue changes extend into perinodal soft tissue and are either focally or broadly present at the cauterized edge of the tissue submitted. There are no definitive features of malignancy  present.   PROCEDURES  ECHO 06/20/17  Study Conclusions - Left ventricle: The cavity size was normal. Wall thickness was   normal. Systolic function was normal. The estimated ejection   fraction was in the range of 55% to 60%. Wall motion was normal;   there were no regional wall motion abnormalities. - Mitral valve: There was mild regurgitation. - Impressions: LS&' not measured well. GLS - 19.1% Impressions: - LS&' not measured well. GLS - 19.1%   ECHO 12/12/16 Study Conclusions - Left ventricle: The cavity size was normal. Wall thickness was   normal. Systolic function was normal. The estimated ejection   fraction was in the range of 55% to 60%. Wall motion was normal;   there were no regional wall motion abnormalities. Doppler   parameters are consistent with abnormal left ventricular   relaxation (grade 1 diastolic dysfunction). Impressions: - Normal LV systolic function; mild diastolic dysfunction; trace MR   and TR; global longitudinal strain -19.7%.   RADIOGRAPHIC STUDIES:   CT CAP W Contrast 07/31/17 IMPRESSION: 1. No findings of active or recurrent malignancy. 2. Other imaging findings of potential clinical significance: Aortic Atherosclerosis (ICD10-I70.0). Mild cardiomegaly. Postoperative and radiation therapy findings in the right chest. Nonobstructive right nephrolithiasis. Thoracolumbar scoliosis.   Bone Scan Whole Body 07/31/17  IMPRESSION: 1. No scintigraphic evidence of osseous metastatic disease. 2. Thoracolumbar scoliosis.   Diagnostic Mammogram Bilateral at [facility] 07/17/17  IMPRESSION:  There is no mammographic evidence of malignancy. A follow-up mamomgram in 12 months is recommended.    CT Chest W Contrast 01/22/17 IMPRESSION: 1. Stable exam. No new or progressive findings. No evidence for metastatic disease. 2. Left upper lobe architectural distortion/scarring is stable. 3. Nonobstructing right renal stone.    ASSESSMENT & PLAN:   47 y.o.  African-American female, premenopausal, without significant past medical history, presented with palpable right breast mass and right axillary mass.   1. 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 - I previously reviewed her breast surgical pathology results with her in great detail. She has had fantastic partial response (near complete response) form 8 months of chemotherapy and antibody therapy.   The initial 6.3 cm breast mass has only 1 mm residual tumor,  And the previously 4.4 cm right axillary lymph node has had complete pathological response, all 7 nodes were negative for cancer. -I previously reviewed her restaging CT scan from 01/22/2017, which showed no evidence of disease, the scar tissue in left upper lobe lung is unchanged. No other new lesions -Continue monitoring cardiac echo every 6 months, she will follow up with Dr. BHaroldine Laws She is scheduled for May, 2019 -We previously discussed her CT CAP and Bone scan from 07/31/17 show no evidence of recurrence or metastasis. Her 07/17/17 mammogram was normal.  -Her 06/2017 ECHO show 55-60% EF, next ECHO in June -She is clinically doing very well, asymptomatic.  Lab reviewed from today, CBC and CMP are WNL, adequate for treatment, we'll continue Herceptin every 3 weeks, indefinitely. -CT CAP W Contrast in 10 weeks  -F/u in 10 weeks  2. Recurrent sinusitis/URI-like symptoms -She has previously had recurrent sinusitis, and bronchitis. Her symptom has resolved lately. -Her symptoms have returned due to allergy season.  -She did have a flare up of this, including sore throat. She was placed on Azithromycin by  her PCP and her symptoms have now improved as of (06/04/17). On exam, her throat appears well.  -I  also previously encouraged her to rest well and drink adequate amounts of water to assist with this.   3. Mild peripheral neuropathy, G1 -Probably related to her prior chemotherapy -previously very mild, we'll continue monitoring.   5. HTN? -No prior history of HTN, but her BP has been slightly elevated lately  -f/u with PCP -I encourage a healthy low salt diet and exercise. She agrees and is going to start walking after work, I also strongly encouraged her to lose weight. She does not want to take medication, and is motivated to follow diet and exercise  -I previously encouraged her to check her BP at home or at a pharmacy.  -I again encouraged her to follow up with a PCP, she will call to schedule her appointment   6. Obesity  -I previously encouraged her to have healthy diet, exercise regularly, and try to lose some weight.   Plan  -Labs adequate to proceed with Herceptin today. Will schedule treatment in 3, 6 and 10 weeks  -Lab, flush and f/u 10 weeks (postpone for one week due to her vacation) -Echo in June  -CT CAP W Contrast in early July before next OV   I spent about 15 minutes counseling the patient and her family members, total care was about 20 minutes.  This document serves as a record of services personally performed by [name], MD. It was created on her behalf by Theresia Bough, a trained medical scribe. The creation of this record is based on the scribe's personal observations and the provider's statements to them.   I have reviewed the above documentation for accuracy and completeness, and I agree with the above.   [name]  12/11/2017 5:53 PM

## 2017-12-11 ENCOUNTER — Other Ambulatory Visit: Payer: Self-pay | Admitting: *Deleted

## 2017-12-11 ENCOUNTER — Inpatient Hospital Stay: Payer: 59

## 2017-12-11 ENCOUNTER — Telehealth: Payer: Self-pay | Admitting: *Deleted

## 2017-12-11 ENCOUNTER — Inpatient Hospital Stay (HOSPITAL_BASED_OUTPATIENT_CLINIC_OR_DEPARTMENT_OTHER): Payer: 59 | Admitting: Hematology

## 2017-12-11 ENCOUNTER — Encounter: Payer: Self-pay | Admitting: Hematology

## 2017-12-11 ENCOUNTER — Inpatient Hospital Stay: Payer: 59 | Attending: Hematology

## 2017-12-11 VITALS — BP 144/80 | HR 96 | Temp 98.6°F | Resp 18 | Ht 70.0 in | Wt 233.8 lb

## 2017-12-11 DIAGNOSIS — T451X5A Adverse effect of antineoplastic and immunosuppressive drugs, initial encounter: Secondary | ICD-10-CM

## 2017-12-11 DIAGNOSIS — I10 Essential (primary) hypertension: Secondary | ICD-10-CM | POA: Insufficient documentation

## 2017-12-11 DIAGNOSIS — G620 Drug-induced polyneuropathy: Secondary | ICD-10-CM

## 2017-12-11 DIAGNOSIS — G629 Polyneuropathy, unspecified: Secondary | ICD-10-CM | POA: Diagnosis not present

## 2017-12-11 DIAGNOSIS — C50911 Malignant neoplasm of unspecified site of right female breast: Secondary | ICD-10-CM

## 2017-12-11 DIAGNOSIS — Z5112 Encounter for antineoplastic immunotherapy: Secondary | ICD-10-CM | POA: Insufficient documentation

## 2017-12-11 DIAGNOSIS — Z171 Estrogen receptor negative status [ER-]: Secondary | ICD-10-CM

## 2017-12-11 DIAGNOSIS — C50919 Malignant neoplasm of unspecified site of unspecified female breast: Secondary | ICD-10-CM

## 2017-12-11 DIAGNOSIS — Z79899 Other long term (current) drug therapy: Principal | ICD-10-CM

## 2017-12-11 DIAGNOSIS — E669 Obesity, unspecified: Secondary | ICD-10-CM | POA: Insufficient documentation

## 2017-12-11 DIAGNOSIS — C7800 Secondary malignant neoplasm of unspecified lung: Principal | ICD-10-CM

## 2017-12-11 DIAGNOSIS — C7802 Secondary malignant neoplasm of left lung: Secondary | ICD-10-CM

## 2017-12-11 DIAGNOSIS — Z452 Encounter for adjustment and management of vascular access device: Secondary | ICD-10-CM | POA: Insufficient documentation

## 2017-12-11 DIAGNOSIS — Z5181 Encounter for therapeutic drug level monitoring: Secondary | ICD-10-CM

## 2017-12-11 DIAGNOSIS — Z95828 Presence of other vascular implants and grafts: Secondary | ICD-10-CM

## 2017-12-11 LAB — CBC WITH DIFFERENTIAL/PLATELET
Basophils Absolute: 0 10*3/uL (ref 0.0–0.1)
Basophils Relative: 1 %
Eosinophils Absolute: 0.1 10*3/uL (ref 0.0–0.5)
Eosinophils Relative: 1 %
HEMATOCRIT: 36.3 % (ref 34.8–46.6)
HEMOGLOBIN: 12.1 g/dL (ref 11.6–15.9)
LYMPHS ABS: 2.1 10*3/uL (ref 0.9–3.3)
Lymphocytes Relative: 24 %
MCH: 29.3 pg (ref 25.1–34.0)
MCHC: 33.3 g/dL (ref 31.5–36.0)
MCV: 88 fL (ref 79.5–101.0)
MONOS PCT: 6 %
Monocytes Absolute: 0.5 10*3/uL (ref 0.1–0.9)
NEUTROS ABS: 5.9 10*3/uL (ref 1.5–6.5)
NEUTROS PCT: 68 %
Platelets: 230 10*3/uL (ref 145–400)
RBC: 4.13 MIL/uL (ref 3.70–5.45)
RDW: 13.8 % (ref 11.2–14.5)
WBC: 8.7 10*3/uL (ref 3.9–10.3)

## 2017-12-11 LAB — COMPREHENSIVE METABOLIC PANEL
ALT: 24 U/L (ref 0–55)
ANION GAP: 8 (ref 3–11)
AST: 21 U/L (ref 5–34)
Albumin: 3.8 g/dL (ref 3.5–5.0)
Alkaline Phosphatase: 82 U/L (ref 40–150)
BUN: 15 mg/dL (ref 7–26)
CHLORIDE: 105 mmol/L (ref 98–109)
CO2: 27 mmol/L (ref 22–29)
CREATININE: 0.97 mg/dL (ref 0.60–1.10)
Calcium: 9.9 mg/dL (ref 8.4–10.4)
GFR calc non Af Amer: >60 mL/min (ref >60)
Glucose, Bld: 97 mg/dL (ref 70–140)
Potassium: 3.9 mmol/L (ref 3.5–5.1)
Sodium: 140 mmol/L (ref 136–145)
Total Bilirubin: 0.3 mg/dL (ref 0.2–1.2)
Total Protein: 8.1 g/dL (ref 6.4–8.3)

## 2017-12-11 MED ORDER — SODIUM CHLORIDE 0.9 % IJ SOLN
10.0000 mL | INTRAMUSCULAR | Status: DC | PRN
  Administered 2017-12-11: 10 mL
  Filled 2017-12-11: qty 10

## 2017-12-11 MED ORDER — ACETAMINOPHEN 325 MG PO TABS: 650.0000 mg | ORAL_TABLET | Freq: Once | ORAL | Status: DC

## 2017-12-11 MED ORDER — SODIUM CHLORIDE 0.9 % IV SOLN
Freq: Once | INTRAVENOUS | Status: AC
  Administered 2017-12-11: 14:00:00 via INTRAVENOUS

## 2017-12-11 MED ORDER — ALTEPLASE 2 MG IJ SOLR
INTRAMUSCULAR | Status: AC
  Filled 2017-12-11: qty 2

## 2017-12-11 MED ORDER — HEPARIN SOD (PORK) LOCK FLUSH 100 UNIT/ML IV SOLN
500.0000 [IU] | Freq: Once | INTRAVENOUS | Status: AC | PRN
  Administered 2017-12-11: 500 [IU]
  Filled 2017-12-11: qty 5

## 2017-12-11 MED ORDER — ALTEPLASE 2 MG IJ SOLR
2.0000 mg | Freq: Once | INTRAMUSCULAR | Status: AC | PRN
  Administered 2017-12-11: 2 mg
  Filled 2017-12-11: qty 2

## 2017-12-11 MED ORDER — SODIUM CHLORIDE 0.9 % IJ SOLN
10.0000 mL | INTRAMUSCULAR | Status: DC | PRN
  Administered 2017-12-11: 10 mL via INTRAVENOUS
  Filled 2017-12-11: qty 10

## 2017-12-11 MED ORDER — TRASTUZUMAB CHEMO 150 MG IV SOLR
600.0000 mg | Freq: Once | INTRAVENOUS | Status: AC
  Administered 2017-12-11: 600 mg via INTRAVENOUS
  Filled 2017-12-11: qty 28.57

## 2017-12-11 NOTE — Telephone Encounter (Signed)
Per Dr Burr Medico -pt is seeing Dr Haroldine Laws and has scheduled ECHO for 01/20/2018.  Ok to proceed with herceptin therapy today.

## 2017-12-11 NOTE — Progress Notes (Signed)
Pt has Echo scheduled in June 2019

## 2017-12-11 NOTE — Patient Instructions (Signed)
[facility] Discharge Instructions for Patients Receiving Chemotherapy Today you received the following chemotherapy agents:  Herceptin To help prevent nausea and vomiting after your treatment, we encourage you to take your nausea medication as prescribed.   If you develop nausea and vomiting that is not controlled by your nausea medication, call the clinic.   BELOW ARE SYMPTOMS THAT SHOULD BE REPORTED IMMEDIATELY:  *FEVER GREATER THAN 100.5 F  *CHILLS WITH OR WITHOUT FEVER  NAUSEA AND VOMITING THAT IS NOT CONTROLLED WITH YOUR NAUSEA MEDICATION  *UNUSUAL SHORTNESS OF BREATH  *UNUSUAL BRUISING OR BLEEDING  TENDERNESS IN MOUTH AND THROAT WITH OR WITHOUT PRESENCE OF ULCERS  *URINARY PROBLEMS  *BOWEL PROBLEMS  UNUSUAL RASH Items with * indicate a potential emergency and should be followed up as soon as possible.  Feel free to call the clinic should you have any questions or concerns. The clinic phone number is [phone].  Please show the CHEMO ALERT CARD at check-in to the Emergency Department and triage nurse.   

## 2017-12-18 ENCOUNTER — Other Ambulatory Visit: Payer: Self-pay | Admitting: Hematology

## 2017-12-19 ENCOUNTER — Telehealth: Payer: Self-pay | Admitting: Hematology

## 2017-12-19 NOTE — Telephone Encounter (Signed)
Called pt re appts being added per 5/8 sch msg - spoke w/ pt re appts.

## 2018-01-01 ENCOUNTER — Ambulatory Visit: Payer: 59

## 2018-01-01 ENCOUNTER — Inpatient Hospital Stay: Payer: 59

## 2018-01-01 VITALS — BP 161/81 | HR 73 | Temp 98.4°F | Resp 16

## 2018-01-01 DIAGNOSIS — Z5112 Encounter for antineoplastic immunotherapy: Secondary | ICD-10-CM | POA: Diagnosis not present

## 2018-01-01 DIAGNOSIS — C7800 Secondary malignant neoplasm of unspecified lung: Principal | ICD-10-CM

## 2018-01-01 DIAGNOSIS — C50911 Malignant neoplasm of unspecified site of right female breast: Secondary | ICD-10-CM

## 2018-01-01 MED ORDER — SODIUM CHLORIDE 0.9 % IJ SOLN
10.0000 mL | INTRAMUSCULAR | Status: DC | PRN
  Administered 2018-01-01: 10 mL
  Filled 2018-01-01: qty 10

## 2018-01-01 MED ORDER — HEPARIN SOD (PORK) LOCK FLUSH 100 UNIT/ML IV SOLN
500.0000 [IU] | Freq: Once | INTRAVENOUS | Status: AC | PRN
  Administered 2018-01-01: 500 [IU]
  Filled 2018-01-01: qty 5

## 2018-01-01 MED ORDER — ACETAMINOPHEN 325 MG PO TABS: 650.0000 mg | ORAL_TABLET | Freq: Once | ORAL | Status: DC

## 2018-01-01 MED ORDER — TRASTUZUMAB CHEMO 150 MG IV SOLR
600.0000 mg | Freq: Once | INTRAVENOUS | Status: AC
  Administered 2018-01-01: 600 mg via INTRAVENOUS
  Filled 2018-01-01: qty 28.57

## 2018-01-01 MED ORDER — SODIUM CHLORIDE 0.9 % IV SOLN
Freq: Once | INTRAVENOUS | Status: AC
  Administered 2018-01-01: 16:00:00 via INTRAVENOUS

## 2018-01-01 NOTE — Patient Instructions (Signed)
[facility] Discharge Instructions for Patients Receiving Chemotherapy  Today you received the following chemotherapy agents:  Herceptin (trastuzumab)  To help prevent nausea and vomiting after your treatment, we encourage you to take your nausea medication as prescribed.   If you develop nausea and vomiting that is not controlled by your nausea medication, call the clinic.   BELOW ARE SYMPTOMS THAT SHOULD BE REPORTED IMMEDIATELY:  *FEVER GREATER THAN 100.5 F  *CHILLS WITH OR WITHOUT FEVER  NAUSEA AND VOMITING THAT IS NOT CONTROLLED WITH YOUR NAUSEA MEDICATION  *UNUSUAL SHORTNESS OF BREATH  *UNUSUAL BRUISING OR BLEEDING  TENDERNESS IN MOUTH AND THROAT WITH OR WITHOUT PRESENCE OF ULCERS  *URINARY PROBLEMS  *BOWEL PROBLEMS  UNUSUAL RASH Items with * indicate a potential emergency and should be followed up as soon as possible.  Feel free to call the clinic should you have any questions or concerns. The clinic phone number is [phone].  Please show the [place] at check-in to the Emergency Department and triage nurse.

## 2018-01-20 ENCOUNTER — Ambulatory Visit (HOSPITAL_COMMUNITY)
Admission: RE | Admit: 2018-01-20 | Discharge: 2018-01-20 | Disposition: A | Discharge status: Home / Self Care | Payer: 59 | Source: Ambulatory Visit | Attending: Internal Medicine | Admitting: Internal Medicine

## 2018-01-20 ENCOUNTER — Ambulatory Visit (HOSPITAL_BASED_OUTPATIENT_CLINIC_OR_DEPARTMENT_OTHER)
Admission: RE | Admit: 2018-01-20 | Discharge: 2018-01-20 | Disposition: A | Discharge status: Home / Self Care | Payer: 59 | Source: Ambulatory Visit | Attending: Internal Medicine | Admitting: Internal Medicine

## 2018-01-20 ENCOUNTER — Other Ambulatory Visit: Payer: Self-pay

## 2018-01-20 VITALS — BP 172/98 | HR 75 | Wt 233.8 lb

## 2018-01-20 DIAGNOSIS — C50919 Malignant neoplasm of unspecified site of unspecified female breast: Secondary | ICD-10-CM | POA: Diagnosis not present

## 2018-01-20 DIAGNOSIS — K219 Gastro-esophageal reflux disease without esophagitis: Secondary | ICD-10-CM | POA: Insufficient documentation

## 2018-01-20 DIAGNOSIS — N200 Calculus of kidney: Secondary | ICD-10-CM | POA: Diagnosis not present

## 2018-01-20 DIAGNOSIS — I10 Essential (primary) hypertension: Secondary | ICD-10-CM | POA: Insufficient documentation

## 2018-01-20 DIAGNOSIS — Z886 Allergy status to analgesic agent status: Secondary | ICD-10-CM | POA: Diagnosis not present

## 2018-01-20 DIAGNOSIS — C50912 Malignant neoplasm of unspecified site of left female breast: Secondary | ICD-10-CM | POA: Diagnosis not present

## 2018-01-20 DIAGNOSIS — Z888 Allergy status to other drugs, medicaments and biological substances status: Secondary | ICD-10-CM | POA: Insufficient documentation

## 2018-01-20 DIAGNOSIS — C50911 Malignant neoplasm of unspecified site of right female breast: Secondary | ICD-10-CM | POA: Insufficient documentation

## 2018-01-20 DIAGNOSIS — C7800 Secondary malignant neoplasm of unspecified lung: Secondary | ICD-10-CM | POA: Diagnosis not present

## 2018-01-20 DIAGNOSIS — Z885 Allergy status to narcotic agent status: Secondary | ICD-10-CM | POA: Insufficient documentation

## 2018-01-20 MED ORDER — LOSARTAN POTASSIUM 25 MG PO TABS: 25.0000 mg | ORAL_TABLET | Freq: Every day | ORAL | 11 refills | Status: DC

## 2018-01-20 NOTE — Patient Instructions (Signed)
Losartan 25 mg daily  We will contact you in 6 months to schedule your next appointment and echocardiogram

## 2018-01-20 NOTE — Addendum Note (Signed)
Encounter addended by: Scarlette Calico, RN on: 01/20/2018 3:43 PM  Actions taken: Diagnosis association updated, Order list changed

## 2018-01-20 NOTE — Progress Notes (Signed)
  Echocardiogram 2D Echocardiogram has been performed.  Bobbye Charleston 01/20/2018, 2:43 PM

## 2018-01-20 NOTE — Progress Notes (Signed)
Patient ID: [name] [name], female   DOB: 1971/03/19, 47 y.o.   MRN: [id number]   [facility] NOTE  Patient Care Team: System, Pcp Not In as PCP - [place], [place], DO (Family Medicine) Holley Bouche, NP as Nurse Practitioner (Nurse Practitioner) Stark Klein, MD as Consulting Physician (General Surgery) Arloa Koh, MD as Consulting Physician (Radiation Oncology) [name], MD as Consulting Physician (Hematology)  HPI:   [name] [name] is a 47 y/o woman with Stage IV breast CA referred by Dr. Burr Medico for enrollment into the cardio-oncology clinic for surveillance while receiving Herceptin (started 04/12/15 and on indefinitiely)  Oncology History   Breast cancer 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   Staging form: Breast, AJCC 7th Edition     Clinical stage from 07/22/2014: Stage IV (T3, N1, M1) - Unsigned       Breast cancer 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 ([place])   07/02/2014 Mammogram    Mammogram showed a 2cm right beast mass and a 1.8cm right axillary node. MRI breast on 07/16/2014 showed 7cm R breast lesion and 4.4cm r axillary node       07/02/2014 Imaging    CT CAP: a 4.7cm mass in LUL lung and a 2.1cm mas in RML, and a small nodule in RUL, suspecious for metastasis        07/09/2014 Initial Diagnosis    right IDA with b/l lung lesions, ER-/PR-/HER2+      07/09/2014 Initial Biopsy    US guided right breast mass and axillary node biopsy showed IDA, and DCIS, ER-/PR-/HER2+      07/26/2014 Pathologic Stage    Left lung mass by IR, path revealed high grade carcinoma, morphology similar to breast tumor biopsy, TTF(-), NapsinA(-), ER(-)      08/04/2014 - 03/22/2015 Chemotherapy    weekly Paclitaxel 58m/m2, trastuzumab and pertuzumab every 3 weeks      08/30/2014 Genetic Testing    BreastNext panel was negative. 17 genes including BRCA1, BRCA2, were negative for mutations.       10/04/2014 Imaging    Interval decrease in the right axillary lymphadenopathy. Bilateral  pulmonary lesions with left hilar lymphadenopathy also markedly decreased in the interval. The left hilar lymphadenopathy has resolved.      12/20/2014 Imaging    restaging CT showed stable disease, no new lesions       03/29/2015 Pathology Results     right breast lumpectomy showed  chemotherapy treatment effect,  a 1 mm residual tumor,   margins were widely negative, 5 sentinel lymph nodes and 2 axillary lymph nodes were negative.       03/29/2015 Surgery     right breast lumpectomy and sentinel lymph node biopsy  by Dr. BBarry Dienes     04/12/2015 -  Chemotherapy     Herceptin maintenance therapy , 6 mg/kg, every 3 weeks      05/03/2015 - 06/14/2015 Radiation Therapy    right breast adjuvant irradiation by Dr. MValere Dross      08/28/2016 Imaging    CT CAP w Contrast  IMPRESSION: 1. No acute process or evidence of metastatic disease within the chest, abdomen, or pelvis. 2. Similar to less well-defined left upper lobe density, likely scarring. No evidence of new or progressive pulmonary metastasis. 3. Right nephrolithiasis.      01/22/2017 Imaging    CT Chest W Contrast 01/22/17 IMPRESSION: 1. Stable exam. No new or progressive findings. No evidence for metastatic disease. 2. Left upper lobe architectural distortion/scarring is stable. 3. Nonobstructing right renal  stone.      07/31/2017 Imaging    Bone Scan Whole Body 07/31/17  IMPRESSION: 1. No scintigraphic evidence of osseous metastatic disease. 2. Thoracolumbar scoliosis.       07/31/2017 Imaging    CT CAP W Contrast 07/31/17 IMPRESSION: 1. No findings of active or recurrent malignancy. 2. Other imaging findings of potential clinical significance: Aortic Atherosclerosis (ICD10-I70.0). Mild cardiomegaly. Postoperative and radiation therapy findings in the right chest. Nonobstructive right nephrolithiasis. Thoracolumbar scoliosis.        Continues to receive herceptin for metastatic breast CA (has been receiving  Herceptin since 8/16. Last CT sscan showed resolution of mets. Feels good. Remains active. No SOB, edema, orthopnea or PND. 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   Echo 01/20/17. EF 55-60% LS' 10.4 cm/s GLS -20.7% Personally reviewed  Echo 06/20/17 EF 55-60% LS' not measured will  GLS -19.1  Echo 3/16 55-60% Lat s' 12.0 cm/sec GLS -17.5% Echo 2/17 55-60%  Lat s' 11.2 cm/sec GLS -17.2% Echo 01/18/16 EF 60-65% lat s' 7.9 cm/sec (out of plane) GLS -20.2% Echo 05/22/16 EF 60-65%  Lat s'11.2cm.sec GLS underestimated due to poor endocardial tracking Echo 5/18 EF 60% LS' 11.9 cm/s GLS -19.7  MEDICAL HISTORY:  Past Medical History:  Diagnosis Date  . Breast cancer ([place])   . Breast cancer ([place])   . GERD (gastroesophageal reflux disease)    during pregnancy   . Pneumonia    hx of pneumonia 08/2013   . S/P radiation therapy 05/03/2015 through 06/14/2015    Right breast 4680 cGy in 26 sessions, right breast boost 1000 cGy in 5 sessions. Right supraclavicular/axillary region 4680 cGy with a supplemental PA field to bring the axillary dose up to 4500 cGy in 26 sessions    SURGICAL HISTORY: Past Surgical History:  Procedure Laterality Date  . BREAST LUMPECTOMY WITH RADIOACTIVE SEED AND SENTINEL LYMPH NODE BIOPSY Right 03/29/2015   Procedure: BREAST LUMPECTOMY WITH RADIOACTIVE SEED AND SENTINEL LYMPH NODE BIOPSY;  Surgeon: Stark Klein, MD;  Location: [facility];  Service: General;  Laterality: Right;  . CESAREAN SECTION    . CHOLECYSTECTOMY    . ESSURE TUBAL LIGATION    . PORTACATH PLACEMENT Left 07/21/2014   Procedure: INSERTION PORT-A-CATH;  Surgeon: Stark Klein, MD;  Location: [facility];  Service: General;  Laterality: Left;  . WISDOM TOOTH EXTRACTION      SOCIAL HISTORY: History   Social History  . Marital Status: Married    Spouse Name: N/A    Number of Children:  she has 2 daughters at the age of 58 and 76.   . Years of  Education: N/A   Occupational History  .  works as Freight forwarder for a [employer].    Social History Main Topics  . Smoking status: Never Smoker   . Smokeless tobacco: Not on file  . Alcohol Use: Yes  . Drug Use: No  . Sexual Activity: No    FAMILY HISTORY: Family History  Problem Relation Age of Onset  . Breast cancer Mother 1  . Liver cancer Father 46  . Breast cancer Maternal Aunt 36  . Prostate cancer Maternal Grandfather   . Liver cancer Paternal Grandmother   . Prostate cancer Paternal Grandfather      GENETICS: 08/30/2014 BreastNext panel was negative. 17 genes including BRCA1, BRCA2, were negative for mutations.   ALLERGIES:  is allergic to adhesive [tape]; aspirin; and codeine.  MEDICATIONS:  Current Outpatient Medications  Medication Sig  Dispense Refill  . Calcium-Phosphorus-Vitamin D (CALCIUM GUMMIES PO) Take by mouth daily.    . Camphor-Eucalyptus-Menthol (VICKS VAPORUB EX) Apply 1 application topically at bedtime. Applies under nose, throat and on chest.    . cetirizine (ZYRTEC) 10 MG tablet Take 10 mg by mouth daily as needed for allergies (allergies).     . hydrocortisone 2.5 % cream APPLY TOPICALLY TO THE AFFECTED AREA TWICE DAILY 454 g 0  . ibuprofen (ADVIL,MOTRIN) 800 MG tablet Take 1 tablet (800 mg total) by mouth every 8 (eight) hours as needed. 60 tablet 1  . lidocaine-prilocaine (EMLA) cream APPLY TOPICALLY TO THE AFFECTED AREA AS NEEDED 30 g 2  . loperamide (IMODIUM A-D) 2 MG tablet Take 1 tablet (2 mg total) by mouth 4 (four) times daily as needed for diarrhea or loose stools. 30 tablet 0  . metaxalone (SKELAXIN) 800 MG tablet Take 1 tablet (800 mg total) by mouth 3 (three) times daily as needed for muscle spasms. 60 tablet 1  . Multiple Vitamins-Minerals (AIRBORNE PO) Take 1 tablet by mouth daily.    . Multiple Vitamins-Minerals (MULTIVITAMIN GUMMIES ADULT PO) Take 1 each by mouth daily. Women's Vitafusion gummie    . triamcinolone (KENALOG) 0.025 %  ointment Apply 1 application topically 2 (two) times daily. 30 g 1   No current facility-administered medications for this encounter.    Facility-Administered Medications Ordered in Other Encounters  Medication Dose Route Frequency Provider Last Rate Last Dose  . acetaminophen (TYLENOL) tablet 650 mg  650 mg Oral Once [name], MD      . sodium chloride 0.9 % injection 10 mL  10 mL Intracatheter PRN [name], MD   10 mL at 05/01/16 1622  . sodium chloride 0.9 % injection 10 mL  10 mL Intravenous PRN [name], MD       Vitals:   01/20/18 1459  BP: (!) 172/98  Pulse: 75  SpO2: 91%     PHYSICAL EXAMINATION: General:  Well appearing. No resp difficulty HEENT: normal Neck: supple. no JVD. Carotids 2+ bilat; no bruits. No lymphadenopathy or thryomegaly appreciated. Cor: PMI nondisplaced. Regular rate & rhythm. Soft flow murmur at RUSB  Left port-a-cath Lungs: clear Abdomen: obese soft, nontender, nondistended. No hepatosplenomegaly. No bruits or masses. Good bowel sounds. Extremities: no cyanosis, clubbing, rash, edema Neuro: alert & orientedx3, cranial nerves grossly intact. moves all 4 extremities w/o difficulty. Affect pleasant    LABORATORY DATA:  I have reviewed the data as listed CBC Latest Ref Rng & Units 12/11/2017 10/09/2017 08/01/2017  WBC 3.9 - 10.3 K/uL 8.7 8.0 6.7  Hemoglobin 11.6 - 15.9 g/dL 12.1 11.9 11.9  Hematocrit 34.8 - 46.6 % 36.3 36.4 36.3  Platelets 145 - 400 K/uL 230 237 219    CMP Latest Ref Rng & Units 12/11/2017 10/09/2017 08/01/2017  Glucose 70 - 140 mg/dL 97 112 108  BUN 7 - 26 mg/dL 15 12 10.5  Creatinine 0.60 - 1.10 mg/dL 0.97 0.86 0.9  Sodium 136 - 145 mmol/L 140 137 141  Potassium 3.5 - 5.1 mmol/L 3.9 3.5 3.6  Chloride 98 - 109 mmol/L 105 103 -  CO2 22 - 29 mmol/L 27 25 27   Calcium 8.4 - 10.4 mg/dL 9.9 9.7 9.4  Total Protein 6.4 - 8.3 g/dL 8.1 7.5 7.6  Total Bilirubin 0.2 - 1.2 mg/dL 0.3 0.5 0.25  Alkaline Phos 40 - 150 U/L 82 76 73  AST 5 -  34 U/L 21 19 21   ALT 0 -  55 U/L 24 22 26      ASSESSMENT & PLAN:   1. R breast invasive ductal carcinoma, T3N1M1, stage IV, ER-/PR-/HER2+, with metastases to b/l lungs, biopsy confirmed - I reviewed echos personally. EF and Doppler parameters stable. No HF on exam. Continue Herceptin. - repeat echos in 6 months  2. HTN - BP remains elevated here. In looking over her flowsheet SBP run 130-170 with average I~ 150. I would like to see it under 140 consistently.  - We discussed starting losartan 25 mg daily. I will give her a prescription for this and if SBP >= 150 at herceptin treatment tomorrow she will pick it up.   Glori Bickers MD 01/20/2018

## 2018-01-21 ENCOUNTER — Inpatient Hospital Stay: Payer: 59 | Attending: Hematology

## 2018-01-21 VITALS — BP 171/81 | HR 82 | Temp 98.9°F | Resp 16

## 2018-01-21 DIAGNOSIS — C50911 Malignant neoplasm of unspecified site of right female breast: Secondary | ICD-10-CM | POA: Insufficient documentation

## 2018-01-21 DIAGNOSIS — C7800 Secondary malignant neoplasm of unspecified lung: Secondary | ICD-10-CM | POA: Diagnosis not present

## 2018-01-21 DIAGNOSIS — Z5112 Encounter for antineoplastic immunotherapy: Secondary | ICD-10-CM | POA: Diagnosis not present

## 2018-01-21 MED ORDER — ACETAMINOPHEN 325 MG PO TABS: 650.0000 mg | ORAL_TABLET | Freq: Once | ORAL | Status: DC

## 2018-01-21 MED ORDER — TRASTUZUMAB CHEMO 150 MG IV SOLR
600.0000 mg | Freq: Once | INTRAVENOUS | Status: AC
  Administered 2018-01-21: 600 mg via INTRAVENOUS
  Filled 2018-01-21: qty 28.57

## 2018-01-21 MED ORDER — SODIUM CHLORIDE 0.9 % IJ SOLN
10.0000 mL | INTRAMUSCULAR | Status: DC | PRN
  Administered 2018-01-21: 10 mL
  Filled 2018-01-21: qty 10

## 2018-01-21 MED ORDER — HEPARIN SOD (PORK) LOCK FLUSH 100 UNIT/ML IV SOLN
500.0000 [IU] | Freq: Once | INTRAVENOUS | Status: AC | PRN
  Administered 2018-01-21: 500 [IU]
  Filled 2018-01-21: qty 5

## 2018-01-21 MED ORDER — SODIUM CHLORIDE 0.9 % IV SOLN
Freq: Once | INTRAVENOUS | Status: AC
  Administered 2018-01-21: 16:00:00 via INTRAVENOUS

## 2018-01-21 NOTE — Patient Instructions (Signed)
[facility] Discharge Instructions for Patients Receiving Chemotherapy Today you received the following chemotherapy agents:  Herceptin To help prevent nausea and vomiting after your treatment, we encourage you to take your nausea medication as prescribed.   If you develop nausea and vomiting that is not controlled by your nausea medication, call the clinic.   BELOW ARE SYMPTOMS THAT SHOULD BE REPORTED IMMEDIATELY:  *FEVER GREATER THAN 100.5 F  *CHILLS WITH OR WITHOUT FEVER  NAUSEA AND VOMITING THAT IS NOT CONTROLLED WITH YOUR NAUSEA MEDICATION  *UNUSUAL SHORTNESS OF BREATH  *UNUSUAL BRUISING OR BLEEDING  TENDERNESS IN MOUTH AND THROAT WITH OR WITHOUT PRESENCE OF ULCERS  *URINARY PROBLEMS  *BOWEL PROBLEMS  UNUSUAL RASH Items with * indicate a potential emergency and should be followed up as soon as possible.  Feel free to call the clinic should you have any questions or concerns. The clinic phone number is [phone].  Please show the CHEMO ALERT CARD at check-in to the Emergency Department and triage nurse.   

## 2018-01-24 ENCOUNTER — Telehealth: Payer: Self-pay | Admitting: Hematology

## 2018-01-24 NOTE — Telephone Encounter (Signed)
Called pt re appts being changed due to new template. Spoke w/ pt and confirmed.

## 2018-02-14 NOTE — Progress Notes (Addendum)
[facility] Follow up note   Patient Care Team: System, Pcp Not In as PCP - Roberts, [place], DO (Family Medicine) Holley Bouche, NP as Nurse Practitioner (Nurse Practitioner) Stark Klein, MD as Consulting Physician (General Surgery) Arloa Koh, MD as Consulting Physician (Radiation Oncology) [name], MD as Consulting Physician (Hematology)   Date of Service:  02/19/2018    CHIEF COMPLAINTS Follow up metastatic breast cancer  Oncology History   Breast cancer 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   Staging form: Breast, AJCC 7th Edition     Clinical stage from 07/22/2014: Stage IV (T3, N1, M1) - Unsigned       Breast cancer 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 ([place])   07/02/2014 Mammogram    Mammogram showed a 2cm right beast mass and a 1.8cm right axillary node. MRI breast on 07/16/2014 showed 7cm R breast lesion and 4.4cm r axillary node       07/02/2014 Imaging    CT CAP: a 4.7cm mass in LUL lung and a 2.1cm mas in RML, and a small nodule in RUL, suspecious for metastasis        07/09/2014 Initial Diagnosis    right IDA with b/l lung lesions, ER-/PR-/HER2+      07/09/2014 Initial Biopsy    US guided right breast mass and axillary node biopsy showed IDA, and DCIS, ER-/PR-/HER2+      07/26/2014 Pathologic Stage    Left lung mass by IR, path revealed high grade carcinoma, morphology similar to breast tumor biopsy, TTF(-), NapsinA(-), ER(-)      08/04/2014 - 03/22/2015 Chemotherapy    weekly Paclitaxel 41m/m2, trastuzumab and pertuzumab every 3 weeks      08/30/2014 Genetic Testing    BreastNext panel was negative. 17 genes including BRCA1, BRCA2, were negative for mutations.       10/04/2014 Imaging    Interval decrease in the right axillary lymphadenopathy. Bilateral pulmonary lesions with left hilar lymphadenopathy also markedly decreased in the interval. The left hilar lymphadenopathy has resolved.      12/20/2014 Imaging    restaging  CT showed stable disease, no new lesions       03/29/2015 Pathology Results     right breast lumpectomy showed  chemotherapy treatment effect,  a 1 mm residual tumor,   margins were widely negative, 5 sentinel lymph nodes and 2 axillary lymph nodes were negative.       03/29/2015 Surgery     right breast lumpectomy and sentinel lymph node biopsy  by Dr. BBarry Dienes     04/12/2015 -  Chemotherapy     Herceptin maintenance therapy , 6 mg/kg, every 3 weeks      05/03/2015 - 06/14/2015 Radiation Therapy    right breast adjuvant irradiation by Dr. MValere Dross      08/28/2016 Imaging    CT CAP w Contrast  IMPRESSION: 1. No acute process or evidence of metastatic disease within the chest, abdomen, or pelvis. 2. Similar to less well-defined left upper lobe density, likely scarring. No evidence of new or progressive pulmonary metastasis. 3. Right nephrolithiasis.      01/22/2017 Imaging    CT Chest W Contrast 01/22/17 IMPRESSION: 1. Stable exam. No new or progressive findings. No evidence for metastatic disease. 2. Left upper lobe architectural distortion/scarring is stable. 3. Nonobstructing right renal stone.      07/31/2017 Imaging    Bone Scan Whole Body 07/31/17  IMPRESSION: 1. No scintigraphic evidence of osseous metastatic disease. 2. Thoracolumbar scoliosis.  07/31/2017 Imaging    CT CAP W Contrast 07/31/17 IMPRESSION: 1. No findings of active or recurrent malignancy. 2. Other imaging findings of potential clinical significance: Aortic Atherosclerosis (ICD10-I70.0). Mild cardiomegaly. Postoperative and radiation therapy findings in the right chest. Nonobstructive right nephrolithiasis. Thoracolumbar scoliosis.       01/20/2018 Echocardiogram    ECHO 01/20/2018 Study Conclusions  - Left ventricle: The cavity size was normal. Wall thickness was   normal. Systolic function was normal. The estimated ejection   fraction was in the range of 55% to 60%. Wall motion was  normal;   there were no regional wall motion abnormalities. Doppler   parameters are consistent with abnormal left ventricular   relaxation (grade 1 diastolic dysfunction). - Impressions: GLS -20.7% LS&' 10.4 cm.       02/17/2018 Imaging    02/17/2018 CT CAP IMPRESSION: 1. No evidence for residual or recurrent tumor or metastatic disease. 2.  Aortic Atherosclerosis (ICD10-I70.0). 3. Right renal calculi. 4. Scoliosis.       CURRENT THERAPY:  Herceptin maintenance therapy every 3 weeks, started on 04/12/2015  INTERVAL HISTORY:   [name] [name] is a 47 y.o. female who returns for follow-up. She presents to the clinic today alone. She feels good today. Her sinusitis has improved. She reports nausea after contrast last time she ingested it. No rash, SOB, CP, or HA. She never had these symptoms before even though she's taken it before.  She reports episodes of severe itching in her hands for 10-20 minutes with longest episode being 30 mins. She is not sure if it is related to infusion Her neuropathy is the same.    MEDICAL HISTORY:  Past Medical History:  Diagnosis Date  . Breast cancer ([place])   . Breast cancer ([place])   . GERD (gastroesophageal reflux disease)    during pregnancy   . Pneumonia    hx of pneumonia 08/2013   . S/P radiation therapy 05/03/2015 through 06/14/2015    Right breast 4680 cGy in 26 sessions, right breast boost 1000 cGy in 5 sessions. Right supraclavicular/axillary region 4680 cGy with a supplemental PA field to bring the axillary dose up to 4500 cGy in 26 sessions    SURGICAL HISTORY: Past Surgical History:  Procedure Laterality Date  . BREAST LUMPECTOMY WITH RADIOACTIVE SEED AND SENTINEL LYMPH NODE BIOPSY Right 03/29/2015   Procedure: BREAST LUMPECTOMY WITH RADIOACTIVE SEED AND SENTINEL LYMPH NODE BIOPSY;  Surgeon: Stark Klein, MD;  Location: [facility];  Service: General;  Laterality: Right;   . CESAREAN SECTION    . CHOLECYSTECTOMY    . ESSURE TUBAL LIGATION    . PORTACATH PLACEMENT Left 07/21/2014   Procedure: INSERTION PORT-A-CATH;  Surgeon: Stark Klein, MD;  Location: [facility];  Service: General;  Laterality: Left;  . WISDOM TOOTH EXTRACTION      SOCIAL HISTORY: Social History   Socioeconomic History  . Marital status: Married    Spouse name: Not on file  . Number of children: Not on file  . Years of education: Not on file  . Highest education level: Not on file  Occupational History  . Not on file  Social Needs  . Financial resource strain: Not on file  . Food insecurity:    Worry: Not on file    Inability: Not on file  . Transportation needs:    Medical: Not on file    Non-medical: Not on file  Tobacco Use  . Smoking status: Never Smoker  . Smokeless tobacco:  Never Used  Substance and Sexual Activity  . Alcohol use: Yes    Comment: 2-3 drinks per week   . Drug use: No  . Sexual activity: Never    Birth control/protection: Surgical  Lifestyle  . Physical activity:    Days per week: Not on file    Minutes per session: Not on file  . Stress: Not on file  Relationships  . Social connections:    Talks on phone: Not on file    Gets together: Not on file    Attends religious service: Not on file    Active member of club or organization: Not on file    Attends meetings of clubs or organizations: Not on file    Relationship status: Not on file  Other Topics Concern  . Not on file  Social History Narrative  . Not on file    FAMILY HISTORY: Family History  Problem Relation Age of Onset  . Breast cancer Mother 39  . Liver cancer Father   . Breast cancer Maternal Aunt 86  . Breast cancer Maternal Aunt 27  . Prostate cancer Maternal Grandfather 41  . Liver cancer Paternal Grandmother   . Prostate cancer Paternal Grandfather   . Prostate cancer Maternal Uncle 73  . Prostate cancer Maternal Uncle 68  . Breast cancer Cousin 9       mat first cousin     GENETICS: 08/30/2014 BreastNext panel was negative. 17 genes including BRCA1, BRCA2, were negative for mutations.   ALLERGIES:  is allergic to adhesive [tape]; aspirin; and codeine.  MEDICATIONS:  Current Outpatient Medications  Medication Sig Dispense Refill  . Calcium-Phosphorus-Vitamin D (CALCIUM GUMMIES PO) Take by mouth daily.    . Camphor-Eucalyptus-Menthol (VICKS VAPORUB EX) Apply 1 application topically at bedtime. Applies under nose, throat and on chest.    . cetirizine (ZYRTEC) 10 MG tablet Take 10 mg by mouth daily as needed for allergies (allergies).     . hydrocortisone 2.5 % cream APPLY TOPICALLY TO THE AFFECTED AREA TWICE DAILY 454 g 0  . ibuprofen (ADVIL,MOTRIN) 800 MG tablet Take 1 tablet (800 mg total) by mouth every 8 (eight) hours as needed. 60 tablet 1  . lidocaine-prilocaine (EMLA) cream APPLY TOPICALLY TO THE AFFECTED AREA AS NEEDED 30 g 2  . loperamide (IMODIUM A-D) 2 MG tablet Take 1 tablet (2 mg total) by mouth 4 (four) times daily as needed for diarrhea or loose stools. 30 tablet 0  . losartan (COZAAR) 25 MG tablet Take 1 tablet (25 mg total) by mouth daily. 30 tablet 11  . metaxalone (SKELAXIN) 800 MG tablet Take 1 tablet (800 mg total) by mouth 3 (three) times daily as needed for muscle spasms. 60 tablet 1  . Multiple Vitamins-Minerals (AIRBORNE PO) Take 1 tablet by mouth daily.    . Multiple Vitamins-Minerals (MULTIVITAMIN GUMMIES ADULT PO) Take 1 each by mouth daily. Women's Vitafusion gummie    . triamcinolone (KENALOG) 0.025 % ointment Apply 1 application topically 2 (two) times daily. 30 g 1   No current facility-administered medications for this visit.    Facility-Administered Medications Ordered in Other Visits  Medication Dose Route Frequency Provider Last Rate Last Dose  . acetaminophen (TYLENOL) tablet 650 mg  650 mg Oral Once [name], MD      . acetaminophen (TYLENOL) tablet 650 mg  650 mg Oral Once [name], MD      . heparin lock flush 100  unit/mL  500 Units Intracatheter Once PRN Burr Medico,  Krista Blue, MD      . sodium chloride 0.9 % injection 10 mL  10 mL Intracatheter PRN [name], MD   10 mL at 05/01/16 1622  . sodium chloride 0.9 % injection 10 mL  10 mL Intravenous PRN [name], MD      . sodium chloride 0.9 % injection 10 mL  10 mL Intracatheter PRN [name], MD      . trastuzumab (HERCEPTIN) 600 mg in sodium chloride 0.9 % 250 mL chemo infusion  600 mg Intravenous Once [name], MD 557.1 mL/hr at 02/19/18 1607 600 mg at 02/19/18 1607    REVIEW OF SYSTEMS:   Constitutional: Denies fevers, chills or abnormal night sweats  Eyes: Denies blurriness of vision, double vision or watery eyes Ears, nose, mouth, throat, and face: Denies mucositis or sore throat (+) seasonal allergies Respiratory: Occasional dry cough, no dyspnea or wheezes Cardiovascular: Denies palpitation, chest discomfort or lower extremity swelling Gastrointestinal:  Denies nausea, heartburn or change in bowel habits (+) nausea after contrast ingestion Skin: negative Lymphatics: Denies new lymphadenopathy or easy bruising MSK: (+) itching in both hands  Neurological:Denies numbness, new weaknesses  Behavioral/Psych: Mood is stable, no new changes  All other systems were reviewed with the patient and are negative.  PHYSICAL EXAMINATION: ECOG PERFORMANCE STATUS: 0  Vitals:   02/19/18 1452  BP: (!) 158/78  Pulse: 78  Resp: 18  Temp: 98.6 F (37 C)  TempSrc: Oral  SpO2: 100%  Weight: 234 lb 6.4 oz (106.3 kg)  Height: 5' 10"  (1.778 m)     GENERAL:alert, no distress and comfortable SKIN: skin color, texture, turgor are normal, no rashes or significant lesions EYES: normal, conjunctiva are pink and non-injected, sclera clear OROPHARYNX:no exudate, no erythema and lips, buccal mucosa, and tongue normal.  NECK: supple, thyroid normal size, non-tender, without nodularity LYMPH:  no palpable lymphadenopathy in the cervical, axillary or inguinal LUNGS: clear to  auscultation and percussion with normal breathing effort HEART: regular rate & rhythm and no murmurs and no lower extremity edema ABDOMEN:abdomen soft, non-tender and normal bowel sounds Musculoskeletal:no cyanosis of digits and no clubbing, no tenderness over her right shoulder or spine  PSYCH: alert & oriented x 3 with fluent speech NEURO: no focal motor/sensory deficits Breasts: Breast exam deferred today  LABORATORY DATA:  I have reviewed the data as listed CBC Latest Ref Rng & Units 02/19/2018 12/11/2017 10/09/2017  WBC 3.9 - 10.3 K/uL 7.2 8.7 8.0  Hemoglobin 11.6 - 15.9 g/dL 11.9 12.1 11.9  Hematocrit 34.8 - 46.6 % 35.5 36.3 36.4  Platelets 145 - 400 K/uL 197 230 237    CMP Latest Ref Rng & Units 02/19/2018 12/11/2017 10/09/2017  Glucose 70 - 99 mg/dL 101(H) 97 112  BUN 6 - 20 mg/dL 10 15 12   Creatinine 0.44 - 1.00 mg/dL 0.83 0.97 0.86  Sodium 135 - 145 mmol/L 138 140 137  Potassium 3.5 - 5.1 mmol/L 3.6 3.9 3.5  Chloride 98 - 111 mmol/L 104 105 103  CO2 22 - 32 mmol/L 26 27 25   Calcium 8.9 - 10.3 mg/dL 8.7(L) 9.9 9.7  Total Protein 6.5 - 8.1 g/dL 7.7 8.1 7.5  Total Bilirubin 0.3 - 1.2 mg/dL 0.3 0.3 0.5  Alkaline Phos 38 - 126 U/L 76 82 76  AST 15 - 41 U/L 18 21 19   ALT 0 - 44 U/L 19 24 22    Tumor Marker CA27.29 Results for GAYLE, MARTINEZ (MRN [id number]) as of 02/14/2018 13:37  Ref.  Range 05/22/2016 12:26 10/16/2016 10:33 01/21/2017 15:39 05/14/2017 15:35 10/09/2017 13:47  CA 27.29 Latest Ref Range: 0.0 - 38.6 U/mL 12.8 14.6 5.8 5.6 11.8     PATHOLOGY REPORT    07/09/2014 #1 breast, right needle core biopsy more anterior -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 45)  Lung, biopsy, Left 07/26/2014 - HIGH GRADE CARCINOMA, SEE COMMENT. Microscopic  Comment The carcinoma demonstrates the following immunophenotype: TTF-1 - negative expression. Napsin A- negative expression. CK5/6 - focal, moderate expression. estrogen receptor - negative expression. GCDFP- negative expression. The recent breast biopsy demonstrating Her2 amplified high grade invasive mammary carcinoma is noted ([id number]). Although the immunophenotype of the current case is non-sepcific, it strongly argues against primary lung adenocarcinoma. However, on re-review, the morphology of the current case is essentially identical to the primary mammary carcinoma. In lieu of further immunophenotyping, the tumor will be submitted for Her2 testing and the remaining tissue will be reserved for additional ancillary tumor testing. The results of the Her2 testing will be reported in an addendum. The case was discussed with Dr Burr Medico on 07/28/2015 and 07/29/2015 HER2 POSITIVE   Diagnosis 03/29/2015 1. Breast, lumpectomy, Right - INVASIVE DUCTAL CARCINOMA, SEE COMMENT. - SEE TUMOR SYNOPTIC TEMPLATE BELOW. 2. Lymph node, sentinel, biopsy, Right axillary #1 - ONE LYMPH NODE, NEGATIVE FOR TUMOR (0/1). - SEE COMMENT. 3. Lymph node, sentinel, biopsy, Right axillary #2 - BENIGN FIBROFATTY SOFT TISSUE. - NEGATIVE FOR ATYPIA OR MALIGNANCY. - NEGATIVE FOR LYMPH NODE. 4. Lymph node, sentinel, biopsy, Right axillary #3 - ONE LYMPH NODE, NEGATIVE FOR TUMOR (0/1). - SEE COMMENT. 5. Lymph node, sentinel, biopsy, Right axillary #4 - ONE LYMPH NODE, NEGATIVE FOR TUMOR (0/1). - SEE COMMENT. 6. Lymph node, sentinel, biopsy, Right axillary #5 - ONE LYMPH NODE, NEGATIVE FOR TUMOR (0/1). - SEE COMMENT. 7. Lymph node, sentinel, biopsy, right axillary - ONE LYMPH NODE, NEGATIVE FOR TUMOR (0/1). - SEE COMMENT. 8. Lymph node, sentinel, biopsy, right axillary - ONE LYMPH NODE, NEGATIVE FOR TUMOR (0/1). - SEE COMMENT. 1 of 4 Amended copy Amended FINAL for DAISEE, CENTNER  ([id number]) Microscopic Comment 1. BREAST, INVASIVE TUMOR, WITH LYMPH NODES PRESENT Specimen, including laterality and lymph node sampling (sentinel, non-sentinel): Right breast with sentinel and non-sentinel lymph node sampling Procedure: Lumpectomy Histologic type: Ductal, see comment Grade: 2 of 3, see comment. Tubule formation: 3 Nuclear pleomorphism: 3 Mitotic: 1 Tumor size (glass slide measurement): 1 mm, see comment Margins: Invasive, distance to closest margin: See comment In-situ, distance to closest margin: N/A If margin positive, focally or broadly: N/A Lymphovascular invasion: Absent Ductal carcinoma in situ: Absent Grade: N/A Extensive intraductal component: N/A Lobular neoplasia: Absent Tumor focality: Unifocal, see comment Treatment effect: Present If present, treatment effect in breast tissue, lymph nodes or both: Both breast tissue and lymph nodes Extent of tumor: Skin: N/A Nipple: N/A Skeletal muscle: N/A Lymph nodes: Examined: 5 Sentinel 2 Non-sentinel 7 Total Lymph nodes with metastasis: 0 Isolated tumor cells (< 0.2 mm): N/A Micrometastasis: (> 0.2 mm and < 2.0 mm): N/A Macrometastasis: (> 2.0 mm): N/A Extracapsular extension: N/A Breast prognostic profile: Estrogen receptor: Not repeated, previous study demonstrated 0% positivity ([id number]). Progesterone receptor: Not repeated, previous study demonstrated 0% positivity ([id number]) Her 2 neu: Demonstrates amplificaition ([id number]) Ki-67: Not repeated, previous study demonstrate 79% proliferation rate ([id number]). Non-neoplastic  breast: Neoadjuvant related tissue changes and adenosis. TNM: ypT8m, ypN0, pMX Comments: Numerous representative sections including the 2.0 cm hemorrhagic tissue associated with X-shaped biopsy clip, the 2.0 cm ill defined lesion associated with M-shaped clip, and intervening tissue were submitted for review. Slide sections from the tissue surrounding the  X-shaped and M-shaped clips demonstrate tissue changes consistent with neoadjuvant related change. There is no invasive or in situ carcinom present at either site. However, in a representative section from the intervening tissue (slide G), there is a 1 mm focus of high grade invasive ductal carcinoma present. Given the minute size of the tumor, tumor grading is limited. The tumor is present within non-marginal tissue sections and is considered to be at least 0.5 cm from the nearest margin (medial).   Note: Amendment issued for modification of synoptic table, inadvertent typographical error. The change in the synoptic table was discussed with Dr FBurr Medicoon 04/07/2015. (CRR:ecj 04/07/2015) 2. , 4-8. In parts 2 and 4, there is extensive neoadjuvant related tissue changes including abundant foamy macrophages, fibroinflammatory reaction and dystrophic calcifications. The neoadjuvant tissue changes extend into perinodal soft tissue and are either focally or broadly present at the cauterized edge of the tissue submitted. There are no definitive features of malignancy present.   PROCEDURES  ECHO 01/20/2018 Study Conclusions  - Left ventricle: The cavity size was normal. Wall thickness was   normal. Systolic function was normal. The estimated ejection   fraction was in the range of 55% to 60%. Wall motion was normal;   there were no regional wall motion abnormalities. Doppler   parameters are consistent with abnormal left ventricular   relaxation (grade 1 diastolic dysfunction). - Impressions: GLS -20.7% LS&' 10.4 cm.  ECHO 06/20/17  Study Conclusions - Left ventricle: The cavity size was normal. Wall thickness was   normal. Systolic function was normal. The estimated ejection   fraction was in the range of 55% to 60%. Wall motion was normal;   there were no regional wall motion abnormalities. - Mitral valve: There was mild regurgitation. - Impressions: LS&' not measured well. GLS -  19.1% Impressions: - LS&' not measured well. GLS - 19.1%   ECHO 12/12/16 Study Conclusions - Left ventricle: The cavity size was normal. Wall thickness was   normal. Systolic function was normal. The estimated ejection   fraction was in the range of 55% to 60%. Wall motion was normal;   there were no regional wall motion abnormalities. Doppler   parameters are consistent with abnormal left ventricular   relaxation (grade 1 diastolic dysfunction). Impressions: - Normal LV systolic function; mild diastolic dysfunction; trace MR   and TR; global longitudinal strain -19.7%.   RADIOGRAPHIC STUDIES:  02/17/2018 CT CAP IMPRESSION: 1. No evidence for residual or recurrent tumor or metastatic disease. 2.  Aortic Atherosclerosis (ICD10-I70.0). 3. Right renal calculi. 4. Scoliosis.   CT CAP W Contrast 07/31/17 IMPRESSION: 1. No findings of active or recurrent malignancy. 2. Other imaging findings of potential clinical significance: Aortic Atherosclerosis (ICD10-I70.0). Mild cardiomegaly. Postoperative and radiation therapy findings in the right chest. Nonobstructive right nephrolithiasis. Thoracolumbar scoliosis.   Bone Scan Whole Body 07/31/17  IMPRESSION: 1. No scintigraphic evidence of osseous metastatic disease. 2. Thoracolumbar scoliosis.   [facility]  IMPRESSION:  There is no mammographic evidence of malignancy. A follow-up mamomgram in 12 months is recommended.    CT Chest W Contrast 01/22/17 IMPRESSION: 1. Stable exam. No new or progressive findings. No evidence for metastatic disease.  2. Left upper lobe architectural distortion/scarring is stable. 3. Nonobstructing right renal stone.    ASSESSMENT & PLAN:  47 y.o.  African-American female, premenopausal, without significant past medical history, presented with palpable right breast mass and right axillary mass.   1. 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 - I previously reviewed her breast surgical pathology results with her in great detail. She has had fantastic partial response (near complete response) form 8 months of chemotherapy and antibody therapy.   The initial 6.3 cm breast mass has only 1 mm residual tumor,  And the previously 4.4 cm right axillary lymph node has had complete pathological response, all 7 nodes were negative for cancer. -She has had complete radiographic response also, no evidence of disease on the subsequent staging CT scan. -Continue monitoring cardiac echo every 6 months, she will follow up with Dr. BHaroldine Laws  Her last echo from June 2019 showed normal EF -Her 07/17/17 mammogram was normal, will continue mammogram once a year.  -I have personally reviewed her last restaging CT AP from 02/17/2018 which showed no evidence of disease -Lab reviewed, CBC and CMP has been normal. -Continue Herceptin every 3 weeks, labs every 9 weeks -F/u in 9 weeks  2. Recurrent sinusitis/URI-like symptoms -She has previously had recurrent sinusitis, and bronchitis. Her symptom has resolved lately. -Her symptoms have returned due to allergy season.  -She did have a flare up of this, including sore throat. She was placed on Azithromycin by her PCP and her symptoms have now improved as of (06/04/17). On exam, her throat appears well.  -I also previously encouraged her to rest well and drink adequate amounts of water to assist with this.   3. Mild peripheral neuropathy, G1 -Probably related to her prior chemotherapy -previously very mild, we'll continue monitoring.  She had a few episodes of tingling on bilateral bottom of feet, which spontaneously resolved after 10-20 mins.  We will continue monitoring  5. HTN -No prior  history of HTN, but her BP has been slightly elevated lately  -He was started on losartan by Dr. BHaroldine Lawsin June 2019 -Blood pressure still elevated today, she will monitor at home, and call Dr. BHaroldine Lawsif remains to be high  6. Obesity  -I previously encouraged her to have healthy diet, exercise regularly, and try to lose some weight.   Plan Lab in the skin reviewed, no evidence of disease, will proceed Herceptin today  Herceptin in 3, 6 and 9 weeks Lab, flush and f/u in 9 weeks   I spent about 20 minutes counseling the patient and her family members, total care was about 25 minutes.  IDierdre SearlesDweik am acting as scribe for Dr. YTruitt Merle  I have reviewed the above documentation for accuracy and completeness, and I agree with the above.   YTruitt Merle 02/19/2018 4:34 PM

## 2018-02-17 ENCOUNTER — Ambulatory Visit (HOSPITAL_COMMUNITY)
Admission: RE | Admit: 2018-02-17 | Discharge: 2018-02-17 | Disposition: A | Discharge status: Home / Self Care | Payer: 59 | Source: Ambulatory Visit | Attending: Hematology | Admitting: Hematology

## 2018-02-17 ENCOUNTER — Encounter (HOSPITAL_COMMUNITY): Payer: Self-pay

## 2018-02-17 DIAGNOSIS — C50911 Malignant neoplasm of unspecified site of right female breast: Secondary | ICD-10-CM | POA: Insufficient documentation

## 2018-02-17 DIAGNOSIS — N200 Calculus of kidney: Secondary | ICD-10-CM | POA: Insufficient documentation

## 2018-02-17 DIAGNOSIS — M419 Scoliosis, unspecified: Secondary | ICD-10-CM | POA: Insufficient documentation

## 2018-02-17 DIAGNOSIS — I700 Atherosclerosis of aorta: Secondary | ICD-10-CM | POA: Diagnosis not present

## 2018-02-17 DIAGNOSIS — C7800 Secondary malignant neoplasm of unspecified lung: Secondary | ICD-10-CM | POA: Diagnosis not present

## 2018-02-17 MED ORDER — HEPARIN SOD (PORK) LOCK FLUSH 100 UNIT/ML IV SOLN
INTRAVENOUS | Status: AC
  Filled 2018-02-17: qty 5

## 2018-02-17 MED ORDER — IOPAMIDOL (ISOVUE-300) INJECTION 61%
INTRAVENOUS | Status: AC
  Filled 2018-02-17: qty 100

## 2018-02-17 MED ORDER — IOPAMIDOL (ISOVUE-300) INJECTION 61%
INTRAVENOUS | Status: AC
  Filled 2018-02-17: qty 30

## 2018-02-17 MED ORDER — IOPAMIDOL (ISOVUE-300) INJECTION 61%
30.0000 mL | Freq: Once | INTRAVENOUS | Status: AC | PRN
  Administered 2018-02-17: 30 mL via ORAL

## 2018-02-17 MED ORDER — IOPAMIDOL (ISOVUE-300) INJECTION 61%
100.0000 mL | Freq: Once | INTRAVENOUS | Status: AC | PRN
  Administered 2018-02-17: 100 mL via INTRAVENOUS

## 2018-02-19 ENCOUNTER — Inpatient Hospital Stay (HOSPITAL_BASED_OUTPATIENT_CLINIC_OR_DEPARTMENT_OTHER): Payer: 59 | Admitting: Hematology

## 2018-02-19 ENCOUNTER — Inpatient Hospital Stay: Payer: 59 | Attending: Hematology

## 2018-02-19 ENCOUNTER — Inpatient Hospital Stay: Payer: 59

## 2018-02-19 ENCOUNTER — Encounter: Payer: Self-pay | Admitting: Hematology

## 2018-02-19 VITALS — BP 158/78 | HR 78 | Temp 98.6°F | Resp 18 | Ht 70.0 in | Wt 234.4 lb

## 2018-02-19 DIAGNOSIS — Z95828 Presence of other vascular implants and grafts: Secondary | ICD-10-CM

## 2018-02-19 DIAGNOSIS — C50911 Malignant neoplasm of unspecified site of right female breast: Secondary | ICD-10-CM | POA: Diagnosis present

## 2018-02-19 DIAGNOSIS — C7802 Secondary malignant neoplasm of left lung: Secondary | ICD-10-CM | POA: Insufficient documentation

## 2018-02-19 DIAGNOSIS — I10 Essential (primary) hypertension: Secondary | ICD-10-CM | POA: Diagnosis not present

## 2018-02-19 DIAGNOSIS — Z171 Estrogen receptor negative status [ER-]: Secondary | ICD-10-CM | POA: Insufficient documentation

## 2018-02-19 DIAGNOSIS — Z5112 Encounter for antineoplastic immunotherapy: Secondary | ICD-10-CM | POA: Insufficient documentation

## 2018-02-19 DIAGNOSIS — Z452 Encounter for adjustment and management of vascular access device: Secondary | ICD-10-CM | POA: Insufficient documentation

## 2018-02-19 DIAGNOSIS — C7800 Secondary malignant neoplasm of unspecified lung: Principal | ICD-10-CM

## 2018-02-19 LAB — CBC WITH DIFFERENTIAL/PLATELET
BASOS PCT: 0 %
Basophils Absolute: 0 10*3/uL (ref 0.0–0.1)
EOS ABS: 0.1 10*3/uL (ref 0.0–0.5)
EOS PCT: 1 %
HCT: 35.5 % (ref 34.8–46.6)
Hemoglobin: 11.9 g/dL (ref 11.6–15.9)
Lymphocytes Relative: 29 %
Lymphs Abs: 2.1 10*3/uL (ref 0.9–3.3)
MCH: 29.8 pg (ref 25.1–34.0)
MCHC: 33.5 g/dL (ref 31.5–36.0)
MCV: 89 fL (ref 79.5–101.0)
MONO ABS: 0.6 10*3/uL (ref 0.1–0.9)
Monocytes Relative: 8 %
NEUTROS ABS: 4.4 10*3/uL (ref 1.5–6.5)
Neutrophils Relative %: 62 %
PLATELETS: 197 10*3/uL (ref 145–400)
RBC: 3.99 MIL/uL (ref 3.70–5.45)
RDW: 13.1 % (ref 11.2–14.5)
WBC: 7.2 10*3/uL (ref 3.9–10.3)

## 2018-02-19 LAB — COMPREHENSIVE METABOLIC PANEL
ALBUMIN: 3.7 g/dL (ref 3.5–5.0)
ALK PHOS: 76 U/L (ref 38–126)
ALT: 19 U/L (ref 0–44)
ANION GAP: 8 (ref 5–15)
AST: 18 U/L (ref 15–41)
BILIRUBIN TOTAL: 0.3 mg/dL (ref 0.3–1.2)
BUN: 10 mg/dL (ref 6–20)
CALCIUM: 8.7 mg/dL — AB (ref 8.9–10.3)
CO2: 26 mmol/L (ref 22–32)
Chloride: 104 mmol/L (ref 98–111)
Creatinine, Ser: 0.83 mg/dL (ref 0.44–1.00)
GFR calc Af Amer: >60 mL/min (ref >60)
GFR calc non Af Amer: >60 mL/min (ref >60)
GLUCOSE: 101 mg/dL — AB (ref 70–99)
Potassium: 3.6 mmol/L (ref 3.5–5.1)
Sodium: 138 mmol/L (ref 135–145)
TOTAL PROTEIN: 7.7 g/dL (ref 6.5–8.1)

## 2018-02-19 MED ORDER — SODIUM CHLORIDE 0.9 % IJ SOLN
10.0000 mL | INTRAMUSCULAR | Status: DC | PRN
  Administered 2018-02-19: 10 mL
  Filled 2018-02-19: qty 10

## 2018-02-19 MED ORDER — SODIUM CHLORIDE 0.9 % IJ SOLN
10.0000 mL | INTRAMUSCULAR | Status: DC | PRN
  Administered 2018-02-19: 10 mL via INTRAVENOUS
  Filled 2018-02-19: qty 10

## 2018-02-19 MED ORDER — TRASTUZUMAB CHEMO 150 MG IV SOLR
600.0000 mg | Freq: Once | INTRAVENOUS | Status: AC
  Administered 2018-02-19: 600 mg via INTRAVENOUS
  Filled 2018-02-19: qty 28.57

## 2018-02-19 MED ORDER — ACETAMINOPHEN 325 MG PO TABS: 650.0000 mg | ORAL_TABLET | Freq: Once | ORAL | Status: DC

## 2018-02-19 MED ORDER — SODIUM CHLORIDE 0.9 % IV SOLN
Freq: Once | INTRAVENOUS | Status: AC
  Administered 2018-02-19: 16:00:00 via INTRAVENOUS

## 2018-02-19 MED ORDER — HEPARIN SOD (PORK) LOCK FLUSH 100 UNIT/ML IV SOLN
500.0000 [IU] | Freq: Once | INTRAVENOUS | Status: AC | PRN
  Administered 2018-02-19: 500 [IU]
  Filled 2018-02-19: qty 5

## 2018-02-19 NOTE — Patient Instructions (Signed)
[facility] Discharge Instructions for Patients Receiving Chemotherapy Today you received the following chemotherapy agents:  Herceptin To help prevent nausea and vomiting after your treatment, we encourage you to take your nausea medication as prescribed.   If you develop nausea and vomiting that is not controlled by your nausea medication, call the clinic.   BELOW ARE SYMPTOMS THAT SHOULD BE REPORTED IMMEDIATELY:  *FEVER GREATER THAN 100.5 F  *CHILLS WITH OR WITHOUT FEVER  NAUSEA AND VOMITING THAT IS NOT CONTROLLED WITH YOUR NAUSEA MEDICATION  *UNUSUAL SHORTNESS OF BREATH  *UNUSUAL BRUISING OR BLEEDING  TENDERNESS IN MOUTH AND THROAT WITH OR WITHOUT PRESENCE OF ULCERS  *URINARY PROBLEMS  *BOWEL PROBLEMS  UNUSUAL RASH Items with * indicate a potential emergency and should be followed up as soon as possible.  Feel free to call the clinic should you have any questions or concerns. The clinic phone number is [phone].  Please show the CHEMO ALERT CARD at check-in to the Emergency Department and triage nurse.   

## 2018-02-20 ENCOUNTER — Telehealth: Payer: Self-pay | Admitting: Hematology

## 2018-02-20 LAB — CANCER ANTIGEN 27.29: CAN 27.29: 7 U/mL (ref 0.0–38.6)

## 2018-02-20 NOTE — Telephone Encounter (Signed)
LMVM for patient regarding appointments added to her schedule. Calendar/Letter mailed to patient per 7/11 los

## 2018-03-04 ENCOUNTER — Telehealth: Payer: Self-pay | Admitting: Hematology

## 2018-03-04 NOTE — Telephone Encounter (Signed)
Patient called to r/s appts to later time

## 2018-03-12 ENCOUNTER — Other Ambulatory Visit: Payer: 59

## 2018-03-12 ENCOUNTER — Inpatient Hospital Stay: Payer: 59

## 2018-03-12 ENCOUNTER — Ambulatory Visit: Payer: 59

## 2018-03-12 VITALS — BP 138/83 | HR 81 | Temp 98.9°F | Resp 18

## 2018-03-12 DIAGNOSIS — Z5112 Encounter for antineoplastic immunotherapy: Secondary | ICD-10-CM | POA: Diagnosis not present

## 2018-03-12 DIAGNOSIS — C50911 Malignant neoplasm of unspecified site of right female breast: Secondary | ICD-10-CM

## 2018-03-12 DIAGNOSIS — C7800 Secondary malignant neoplasm of unspecified lung: Principal | ICD-10-CM

## 2018-03-12 MED ORDER — SODIUM CHLORIDE 0.9 % IJ SOLN
3.0000 mL | INTRAMUSCULAR | Status: DC | PRN
  Filled 2018-03-12: qty 10

## 2018-03-12 MED ORDER — ACETAMINOPHEN 325 MG PO TABS: 650.0000 mg | ORAL_TABLET | Freq: Once | ORAL | Status: DC

## 2018-03-12 MED ORDER — TRASTUZUMAB CHEMO 150 MG IV SOLR
600.0000 mg | Freq: Once | INTRAVENOUS | Status: AC
  Administered 2018-03-12: 600 mg via INTRAVENOUS
  Filled 2018-03-12: qty 28.57

## 2018-03-12 MED ORDER — ALTEPLASE 2 MG IJ SOLR
2.0000 mg | Freq: Once | INTRAMUSCULAR | Status: AC | PRN
  Administered 2018-03-12: 2 mg
  Filled 2018-03-12: qty 2

## 2018-03-12 MED ORDER — SODIUM CHLORIDE 0.9 % IV SOLN
Freq: Once | INTRAVENOUS | Status: AC
  Administered 2018-03-12: 16:00:00 via INTRAVENOUS
  Filled 2018-03-12: qty 250

## 2018-03-12 MED ORDER — ALTEPLASE 2 MG IJ SOLR
INTRAMUSCULAR | Status: AC
  Filled 2018-03-12: qty 2

## 2018-03-12 NOTE — Patient Instructions (Signed)
[facility] Discharge Instructions for Patients Receiving Chemotherapy Today you received the following chemotherapy agents:  Herceptin To help prevent nausea and vomiting after your treatment, we encourage you to take your nausea medication as prescribed.   If you develop nausea and vomiting that is not controlled by your nausea medication, call the clinic.   BELOW ARE SYMPTOMS THAT SHOULD BE REPORTED IMMEDIATELY:  *FEVER GREATER THAN 100.5 F  *CHILLS WITH OR WITHOUT FEVER  NAUSEA AND VOMITING THAT IS NOT CONTROLLED WITH YOUR NAUSEA MEDICATION  *UNUSUAL SHORTNESS OF BREATH  *UNUSUAL BRUISING OR BLEEDING  TENDERNESS IN MOUTH AND THROAT WITH OR WITHOUT PRESENCE OF ULCERS  *URINARY PROBLEMS  *BOWEL PROBLEMS  UNUSUAL RASH Items with * indicate a potential emergency and should be followed up as soon as possible.  Feel free to call the clinic should you have any questions or concerns. The clinic phone number is [phone].  Please show the CHEMO ALERT CARD at check-in to the Emergency Department and triage nurse.   

## 2018-04-02 ENCOUNTER — Other Ambulatory Visit: Payer: 59

## 2018-04-02 ENCOUNTER — Inpatient Hospital Stay: Payer: 59 | Attending: Hematology

## 2018-04-02 ENCOUNTER — Ambulatory Visit: Payer: 59

## 2018-04-02 VITALS — BP 143/79 | HR 74 | Temp 99.1°F | Resp 17 | Wt 233.8 lb

## 2018-04-02 DIAGNOSIS — C7802 Secondary malignant neoplasm of left lung: Secondary | ICD-10-CM | POA: Insufficient documentation

## 2018-04-02 DIAGNOSIS — Z5112 Encounter for antineoplastic immunotherapy: Secondary | ICD-10-CM | POA: Diagnosis not present

## 2018-04-02 DIAGNOSIS — C50911 Malignant neoplasm of unspecified site of right female breast: Secondary | ICD-10-CM | POA: Diagnosis present

## 2018-04-02 DIAGNOSIS — C7800 Secondary malignant neoplasm of unspecified lung: Secondary | ICD-10-CM

## 2018-04-02 MED ORDER — SODIUM CHLORIDE 0.9 % IV SOLN
Freq: Once | INTRAVENOUS | Status: AC
  Administered 2018-04-02: 17:00:00 via INTRAVENOUS
  Filled 2018-04-02: qty 250

## 2018-04-02 MED ORDER — SODIUM CHLORIDE 0.9 % IJ SOLN
10.0000 mL | INTRAMUSCULAR | Status: DC | PRN
  Administered 2018-04-02: 10 mL
  Filled 2018-04-02: qty 10

## 2018-04-02 MED ORDER — ACETAMINOPHEN 325 MG PO TABS: 650.0000 mg | ORAL_TABLET | Freq: Once | ORAL | Status: DC

## 2018-04-02 MED ORDER — HEPARIN SOD (PORK) LOCK FLUSH 100 UNIT/ML IV SOLN
500.0000 [IU] | Freq: Once | INTRAVENOUS | Status: AC | PRN
  Administered 2018-04-02: 500 [IU]
  Filled 2018-04-02: qty 5

## 2018-04-02 MED ORDER — TRASTUZUMAB CHEMO 150 MG IV SOLR
600.0000 mg | Freq: Once | INTRAVENOUS | Status: AC
  Administered 2018-04-02: 600 mg via INTRAVENOUS
  Filled 2018-04-02: qty 28.57

## 2018-04-02 NOTE — Patient Instructions (Signed)
[facility] Discharge Instructions for Patients Receiving Chemotherapy  Today you received the following chemotherapy agents Herceptin  To help prevent nausea and vomiting after your treatment, we encourage you to take your nausea medication as directed   If you develop nausea and vomiting that is not controlled by your nausea medication, call the clinic.   BELOW ARE SYMPTOMS THAT SHOULD BE REPORTED IMMEDIATELY:  *FEVER GREATER THAN 100.5 F  *CHILLS WITH OR WITHOUT FEVER  NAUSEA AND VOMITING THAT IS NOT CONTROLLED WITH YOUR NAUSEA MEDICATION  *UNUSUAL SHORTNESS OF BREATH  *UNUSUAL BRUISING OR BLEEDING  TENDERNESS IN MOUTH AND THROAT WITH OR WITHOUT PRESENCE OF ULCERS  *URINARY PROBLEMS  *BOWEL PROBLEMS  UNUSUAL RASH Items with * indicate a potential emergency and should be followed up as soon as possible.  Feel free to call the clinic should you have any questions or concerns. The clinic phone number is [phone].  Please show the CHEMO ALERT CARD at check-in to the Emergency Department and triage nurse.   

## 2018-04-21 NOTE — Progress Notes (Signed)
[facility] Follow up note   Patient Care Team: System, Pcp Not In as PCP - El Negro, Odin, DO (Family Medicine) Holley Bouche, NP as Nurse Practitioner (Nurse Practitioner) Stark Klein, MD as Consulting Physician (General Surgery) Arloa Koh, MD as Consulting Physician (Radiation Oncology) [name], MD as Consulting Physician (Hematology)   Date of Service:  04/23/2018    CHIEF COMPLAINTS Follow up metastatic breast cancer  Oncology History   Breast cancer 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   Staging form: Breast, AJCC 7th Edition     Clinical stage from 07/22/2014: Stage IV (T3, N1, M1) - Unsigned       Breast cancer 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 ([place])   07/02/2014 Mammogram    Mammogram showed a 2cm right beast mass and a 1.8cm right axillary node. MRI breast on 07/16/2014 showed 7cm R breast lesion and 4.4cm r axillary node     07/02/2014 Imaging    CT CAP: a 4.7cm mass in LUL lung and a 2.1cm mas in RML, and a small nodule in RUL, suspecious for metastasis      07/09/2014 Initial Diagnosis    right IDA with b/l lung lesions, ER-/PR-/HER2+    07/09/2014 Initial Biopsy    US guided right breast mass and axillary node biopsy showed IDA, and DCIS, ER-/PR-/HER2+    07/26/2014 Pathologic Stage    Left lung mass by IR, path revealed high grade carcinoma, morphology similar to breast tumor biopsy, TTF(-), NapsinA(-), ER(-)    08/04/2014 - 03/22/2015 Chemotherapy    weekly Paclitaxel 73m/m2, trastuzumab and pertuzumab every 3 weeks    08/30/2014 Genetic Testing    BreastNext panel was negative. 17 genes including BRCA1, BRCA2, were negative for mutations.     10/04/2014 Imaging    Interval decrease in the right axillary lymphadenopathy. Bilateral pulmonary lesions with left hilar lymphadenopathy also markedly decreased in the interval. The left hilar lymphadenopathy has resolved.    12/20/2014 Imaging    restaging CT showed stable  disease, no new lesions     03/29/2015 Pathology Results     right breast lumpectomy showed  chemotherapy treatment effect,  a 1 mm residual tumor,   margins were widely negative, 5 sentinel lymph nodes and 2 axillary lymph nodes were negative.     03/29/2015 Surgery     right breast lumpectomy and sentinel lymph node biopsy  by Dr. BBarry Dienes   04/12/2015 -  Chemotherapy     Herceptin maintenance therapy , 6 mg/kg, every 3 weeks    05/03/2015 - 06/14/2015 Radiation Therapy    right breast adjuvant irradiation by Dr. MValere Dross    08/28/2016 Imaging    CT CAP w Contrast  IMPRESSION: 1. No acute process or evidence of metastatic disease within the chest, abdomen, or pelvis. 2. Similar to less well-defined left upper lobe density, likely scarring. No evidence of new or progressive pulmonary metastasis. 3. Right nephrolithiasis.    01/22/2017 Imaging    CT Chest W Contrast 01/22/17 IMPRESSION: 1. Stable exam. No new or progressive findings. No evidence for metastatic disease. 2. Left upper lobe architectural distortion/scarring is stable. 3. Nonobstructing right renal stone.    07/31/2017 Imaging    Bone Scan Whole Body 07/31/17  IMPRESSION: 1. No scintigraphic evidence of osseous metastatic disease. 2. Thoracolumbar scoliosis.     07/31/2017 Imaging    CT CAP W Contrast 07/31/17 IMPRESSION: 1. No findings of active or recurrent malignancy. 2. Other imaging findings of potential clinical  significance: Aortic Atherosclerosis (ICD10-I70.0). Mild cardiomegaly. Postoperative and radiation therapy findings in the right chest. Nonobstructive right nephrolithiasis. Thoracolumbar scoliosis.     01/20/2018 Echocardiogram    ECHO 01/20/2018 Study Conclusions  - Left ventricle: The cavity size was normal. Wall thickness was   normal. Systolic function was normal. The estimated ejection   fraction was in the range of 55% to 60%. Wall motion was normal;   there were no regional wall motion  abnormalities. Doppler   parameters are consistent with abnormal left ventricular   relaxation (grade 1 diastolic dysfunction). - Impressions: GLS -20.7% LS&' 10.4 cm.     02/17/2018 Imaging    02/17/2018 CT CAP IMPRESSION: 1. No evidence for residual or recurrent tumor or metastatic disease. 2.  Aortic Atherosclerosis (ICD10-I70.0). 3. Right renal calculi. 4. Scoliosis.     CURRENT THERAPY:  Herceptin maintenance therapy every 3 weeks, started on 04/12/2015  INTERVAL HISTORY:   [name] [name] is a 47 y.o. female who returns for follow-up. She is here alone at the clinic. She is doing well and has no new complaints. Her sinus is good this year. She is eating well and her energy levels is good. She wants an EMLA cream refill. She is trying to stay active and exercise.     MEDICAL HISTORY:  Past Medical History:  Diagnosis Date  . Breast cancer ([place])   . Breast cancer ([place])   . GERD (gastroesophageal reflux disease)    during pregnancy   . Pneumonia    hx of pneumonia 08/2013   . S/P radiation therapy 05/03/2015 through 06/14/2015    Right breast 4680 cGy in 26 sessions, right breast boost 1000 cGy in 5 sessions. Right supraclavicular/axillary region 4680 cGy with a supplemental PA field to bring the axillary dose up to 4500 cGy in 26 sessions    SURGICAL HISTORY: Past Surgical History:  Procedure Laterality Date  . BREAST LUMPECTOMY WITH RADIOACTIVE SEED AND SENTINEL LYMPH NODE BIOPSY Right 03/29/2015   Procedure: BREAST LUMPECTOMY WITH RADIOACTIVE SEED AND SENTINEL LYMPH NODE BIOPSY;  Surgeon: Stark Klein, MD;  Location: [facility];  Service: General;  Laterality: Right;  . CESAREAN SECTION    . CHOLECYSTECTOMY    . ESSURE TUBAL LIGATION    . PORTACATH PLACEMENT Left 07/21/2014   Procedure: INSERTION PORT-A-CATH;  Surgeon: Stark Klein, MD;  Location: [facility];  Service: General;  Laterality: Left;  . WISDOM  TOOTH EXTRACTION      SOCIAL HISTORY: Social History   Socioeconomic History  . Marital status: Married    Spouse name: Not on file  . Number of children: Not on file  . Years of education: Not on file  . Highest education level: Not on file  Occupational History  . Not on file  Social Needs  . Financial resource strain: Not on file  . Food insecurity:    Worry: Not on file    Inability: Not on file  . Transportation needs:    Medical: Not on file    Non-medical: Not on file  Tobacco Use  . Smoking status: Never Smoker  . Smokeless tobacco: Never Used  Substance and Sexual Activity  . Alcohol use: Yes    Comment: 2-3 drinks per week   . Drug use: No  . Sexual activity: Never    Birth control/protection: Surgical  Lifestyle  . Physical activity:    Days per week: Not on file    Minutes per session: Not on file  .  Stress: Not on file  Relationships  . Social connections:    Talks on phone: Not on file    Gets together: Not on file    Attends religious service: Not on file    Active member of club or organization: Not on file    Attends meetings of clubs or organizations: Not on file    Relationship status: Not on file  Other Topics Concern  . Not on file  Social History Narrative  . Not on file    FAMILY HISTORY: Family History  Problem Relation Age of Onset  . Breast cancer Mother 3  . Liver cancer Father   . Breast cancer Maternal Aunt 42  . Breast cancer Maternal Aunt 78  . Prostate cancer Maternal Grandfather 16  . Liver cancer Paternal Grandmother   . Prostate cancer Paternal Grandfather   . Prostate cancer Maternal Uncle 73  . Prostate cancer Maternal Uncle 68  . Breast cancer Cousin 61       mat first cousin    GENETICS: 08/30/2014 BreastNext panel was negative. 17 genes including BRCA1, BRCA2, were negative for mutations.   ALLERGIES:  is allergic to adhesive [tape]; aspirin; and codeine.  MEDICATIONS:  Current Outpatient Medications   Medication Sig Dispense Refill  . Calcium-Phosphorus-Vitamin D (CALCIUM GUMMIES PO) Take by mouth daily.    . Camphor-Eucalyptus-Menthol (VICKS VAPORUB EX) Apply 1 application topically at bedtime. Applies under nose, throat and on chest.    . cetirizine (ZYRTEC) 10 MG tablet Take 10 mg by mouth daily as needed for allergies (allergies).     . hydrocortisone 2.5 % cream APPLY TOPICALLY TO THE AFFECTED AREA TWICE DAILY 454 g 0  . ibuprofen (ADVIL,MOTRIN) 800 MG tablet Take 1 tablet (800 mg total) by mouth every 8 (eight) hours as needed. 60 tablet 1  . lidocaine-prilocaine (EMLA) cream APPLY TOPICALLY TO THE AFFECTED AREA AS NEEDED 30 g 2  . loperamide (IMODIUM A-D) 2 MG tablet Take 1 tablet (2 mg total) by mouth 4 (four) times daily as needed for diarrhea or loose stools. 30 tablet 0  . metaxalone (SKELAXIN) 800 MG tablet Take 1 tablet (800 mg total) by mouth 3 (three) times daily as needed for muscle spasms. 60 tablet 1  . Multiple Vitamins-Minerals (AIRBORNE PO) Take 1 tablet by mouth daily.    . Multiple Vitamins-Minerals (MULTIVITAMIN GUMMIES ADULT PO) Take 1 each by mouth daily. Women's Vitafusion gummie    . triamcinolone (KENALOG) 0.025 % ointment Apply 1 application topically 2 (two) times daily. 30 g 1  . losartan (COZAAR) 25 MG tablet Take 1 tablet (25 mg total) by mouth daily. 30 tablet 11   No current facility-administered medications for this visit.    Facility-Administered Medications Ordered in Other Visits  Medication Dose Route Frequency Provider Last Rate Last Dose  . acetaminophen (TYLENOL) tablet 650 mg  650 mg Oral Once [name], MD      . sodium chloride 0.9 % injection 10 mL  10 mL Intracatheter PRN [name], MD   10 mL at 05/01/16 1622  . sodium chloride 0.9 % injection 10 mL  10 mL Intravenous PRN [name], MD      . sodium chloride 0.9 % injection 10 mL  10 mL Intracatheter PRN [name], MD   10 mL at 04/23/18 1430    REVIEW OF SYSTEMS:   Constitutional:  Denies fevers, chills or abnormal night sweats  Eyes: Denies blurriness of vision, double vision or watery eyes  Ears, nose, mouth, throat, and face: Denies mucositis or sore throat (+) seasonal allergies, improving Respiratory: Occasional dry cough, no dyspnea or wheezes Cardiovascular: Denies palpitation, chest discomfort or lower extremity swelling Gastrointestinal:  Denies nausea, heartburn or change in bowel habits Skin: negative Lymphatics: Denies new lymphadenopathy or easy bruising MSK: (+) itching in both hands  Neurological:Denies numbness, new weaknesses  Behavioral/Psych: Mood is stable, no new changes  All other systems were reviewed with the patient and are negative.  PHYSICAL EXAMINATION:  ECOG PERFORMANCE STATUS: 0  Vitals:   04/23/18 1153  BP: (!) 159/81  Pulse: 70  Resp: 18  Temp: 98.4 F (36.9 C)  TempSrc: Oral  SpO2: 100%  Weight: 230 lb (104.3 kg)  Height: _0  (1.778 m)     GENERAL:alert, no distress and comfortable SKIN: skin color, texture, turgor are normal, no rashes or significant lesions EYES: normal, conjunctiva are pink and non-injected, sclera clear OROPHARYNX:no exudate, no erythema and lips, buccal mucosa, and tongue normal.  NECK: supple, thyroid normal size, non-tender, without nodularity LYMPH:  no palpable lymphadenopathy in the cervical, axillary or inguinal LUNGS: clear to auscultation and percussion with normal breathing effort HEART: regular rate & rhythm and no murmurs and no lower extremity edema ABDOMEN:abdomen soft, non-tender and normal bowel sounds Musculoskeletal:no cyanosis of digits and no clubbing, no tenderness over her right shoulder or spine  PSYCH: alert & oriented x 3 with fluent speech NEURO: no focal motor/sensory deficits Breasts: Breast exam was normal today. No palpable masses or skin ornipple changes.   LABORATORY DATA:  I have reviewed the data as listed CBC Latest Ref Rng & Units 04/23/2018 02/19/2018 12/11/2017   WBC 3.9 - 10.3 K/uL 5.8 7.2 8.7  Hemoglobin 11.6 - 15.9 g/dL 11.9 11.9 12.1  Hematocrit 34.8 - 46.6 % 35.9 35.5 36.3  Platelets 145 - 400 K/uL 215 197 230    CMP Latest Ref Rng & Units 04/23/2018 02/19/2018 12/11/2017  Glucose 70 - 99 mg/dL 102(H) 101(H) 97  BUN 6 - 20 mg/dL _1 Creatinine 0.44 - 1.00 mg/dL 1.04(H) 0.83 0.97  Sodium 135 - 145 mmol/L 140 138 140  Potassium 3.5 - 5.1 mmol/L 4.0 3.6 3.9  Chloride 98 - 111 mmol/L 105 104 105  CO2 22 - 32 mmol/L _2 Calcium 8.9 - 10.3 mg/dL 9.2 8.7(L) 9.9  Total Protein 6.5 - 8.1 g/dL 7.7 7.7 8.1  Total Bilirubin 0.3 - 1.2 mg/dL 0.5 0.3 0.3  Alkaline Phos 38 - 126 U/L 73 76 82  AST 15 - 41 U/L _3 ALT 0 - 44 U/L _4 Tumor Marker CA27.29 Results for AMAN, BATLEY (MRN [id number]) as of 02/14/2018 13:37  Ref. Range 05/22/2016 12:26 10/16/2016 10:33 01/21/2017 15:39 05/14/2017 15:35 10/09/2017 13:47  CA 27.29 Latest Ref Range: 0.0 - 38.6 U/mL 12.8 14.6 5.8 5.6 11.8     PATHOLOGY REPORT    07/09/2014 #1 breast, right needle core biopsy more anterior -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 45)  Lung, biopsy, Left 07/26/2014 - HIGH GRADE CARCINOMA, SEE COMMENT. Microscopic Comment The carcinoma demonstrates the following immunophenotype: TTF-1 - negative expression. Napsin A- negative expression. CK5/6 - focal, moderate expression. estrogen receptor - negative expression. GCDFP- negative expression. The recent breast biopsy demonstrating  Her2 amplified high grade invasive mammary carcinoma is noted ([id number]). Although the immunophenotype of the current case is non-sepcific, it strongly argues against primary lung adenocarcinoma. However, on re-review, the morphology of the current case  is essentially identical to the primary mammary carcinoma. In lieu of further immunophenotyping, the tumor will be submitted for Her2 testing and the remaining tissue will be reserved for additional ancillary tumor testing. The results of the Her2 testing will be reported in an addendum. The case was discussed with Dr Burr Medico on 07/28/2015 and 07/29/2015 HER2 POSITIVE   Diagnosis 03/29/2015 1. Breast, lumpectomy, Right - INVASIVE DUCTAL CARCINOMA, SEE COMMENT. - SEE TUMOR SYNOPTIC TEMPLATE BELOW. 2. Lymph node, sentinel, biopsy, Right axillary #1 - ONE LYMPH NODE, NEGATIVE FOR TUMOR (0/1). - SEE COMMENT. 3. Lymph node, sentinel, biopsy, Right axillary #2 - BENIGN FIBROFATTY SOFT TISSUE. - NEGATIVE FOR ATYPIA OR MALIGNANCY. - NEGATIVE FOR LYMPH NODE. 4. Lymph node, sentinel, biopsy, Right axillary #3 - ONE LYMPH NODE, NEGATIVE FOR TUMOR (0/1). - SEE COMMENT. 5. Lymph node, sentinel, biopsy, Right axillary #4 - ONE LYMPH NODE, NEGATIVE FOR TUMOR (0/1). - SEE COMMENT. 6. Lymph node, sentinel, biopsy, Right axillary #5 - ONE LYMPH NODE, NEGATIVE FOR TUMOR (0/1). - SEE COMMENT. 7. Lymph node, sentinel, biopsy, right axillary - ONE LYMPH NODE, NEGATIVE FOR TUMOR (0/1). - SEE COMMENT. 8. Lymph node, sentinel, biopsy, right axillary - ONE LYMPH NODE, NEGATIVE FOR TUMOR (0/1). - SEE COMMENT. 1 of 4 Amended copy Amended FINAL for LADONYA, JERKINS ([id number]) Microscopic Comment 1. BREAST, INVASIVE TUMOR, WITH LYMPH NODES PRESENT Specimen, including laterality and lymph node sampling (sentinel, non-sentinel): Right breast with sentinel and non-sentinel lymph node sampling Procedure: Lumpectomy Histologic type: Ductal, see comment Grade: 2 of 3, see comment. Tubule formation: 3 Nuclear pleomorphism: 3 Mitotic: 1 Tumor size (glass slide measurement): 1 mm, see comment Margins: Invasive, distance to closest margin: See comment In-situ, distance to closest margin: N/A If margin  positive, focally or broadly: N/A Lymphovascular invasion: Absent Ductal carcinoma in situ: Absent Grade: N/A Extensive intraductal component: N/A Lobular neoplasia: Absent Tumor focality: Unifocal, see comment Treatment effect: Present If present, treatment effect in breast tissue, lymph nodes or both: Both breast tissue and lymph nodes Extent of tumor: Skin: N/A Nipple: N/A Skeletal muscle: N/A Lymph nodes: Examined: 5 Sentinel 2 Non-sentinel 7 Total Lymph nodes with metastasis: 0 Isolated tumor cells (< 0.2 mm): N/A Micrometastasis: (> 0.2 mm and < 2.0 mm): N/A Macrometastasis: (> 2.0 mm): N/A Extracapsular extension: N/A Breast prognostic profile: Estrogen receptor: Not repeated, previous study demonstrated 0% positivity ([id number]). Progesterone receptor: Not repeated, previous study demonstrated 0% positivity ([id number]) Her 2 neu: Demonstrates amplificaition ([id number]) Ki-67: Not repeated, previous study demonstrate 79% proliferation rate ([id number]). Non-neoplastic breast: Neoadjuvant related tissue changes and adenosis. TNM: ypT25m, ypN0, pMX Comments: Numerous representative sections including the 2.0 cm hemorrhagic tissue associated with X-shaped biopsy clip, the 2.0 cm ill defined lesion associated with M-shaped clip, and intervening tissue were submitted for review. Slide sections from the tissue surrounding the X-shaped and M-shaped clips demonstrate tissue changes consistent with neoadjuvant related change. There is no invasive or in situ carcinom present at either site. However, in a representative section from the intervening tissue (slide G), there is a 1 mm focus of high grade invasive ductal carcinoma present. Given the minute size of the tumor, tumor grading is limited. The tumor is present within non-marginal tissue sections and is considered to be at least 0.5 cm from the  nearest margin (medial).   Note: Amendment issued for modification of  synoptic table, inadvertent typographical error. The change in the synoptic table was discussed with Dr Burr Medico on 04/07/2015. (CRR:ecj 04/07/2015) 2. , 4-8. In parts 2 and 4, there is extensive neoadjuvant related tissue changes including abundant foamy macrophages, fibroinflammatory reaction and dystrophic calcifications. The neoadjuvant tissue changes extend into perinodal soft tissue and are either focally or broadly present at the cauterized edge of the tissue submitted. There are no definitive features of malignancy present.   PROCEDURES  ECHO 01/20/2018 Study Conclusions  - Left ventricle: The cavity size was normal. Wall thickness was   normal. Systolic function was normal. The estimated ejection   fraction was in the range of 55% to 60%. Wall motion was normal;   there were no regional wall motion abnormalities. Doppler   parameters are consistent with abnormal left ventricular   relaxation (grade 1 diastolic dysfunction). - Impressions: GLS -20.7% LS&' 10.4 cm.  ECHO 06/20/17  Study Conclusions - Left ventricle: The cavity size was normal. Wall thickness was   normal. Systolic function was normal. The estimated ejection   fraction was in the range of 55% to 60%. Wall motion was normal;   there were no regional wall motion abnormalities. - Mitral valve: There was mild regurgitation. - Impressions: LS&' not measured well. GLS - 19.1% Impressions: - LS&' not measured well. GLS - 19.1%   ECHO 12/12/16 Study Conclusions - Left ventricle: The cavity size was normal. Wall thickness was   normal. Systolic function was normal. The estimated ejection   fraction was in the range of 55% to 60%. Wall motion was normal;   there were no regional wall motion abnormalities. Doppler   parameters are consistent with abnormal left ventricular   relaxation (grade 1 diastolic dysfunction). Impressions: - Normal LV systolic function; mild diastolic dysfunction; trace MR   and TR; global  longitudinal strain -19.7%.   RADIOGRAPHIC STUDIES:  02/17/2018 CT CAP IMPRESSION: 1. No evidence for residual or recurrent tumor or metastatic disease. 2.  Aortic Atherosclerosis (ICD10-I70.0). 3. Right renal calculi. 4. Scoliosis.   CT CAP W Contrast 07/31/17 IMPRESSION: 1. No findings of active or recurrent malignancy. 2. Other imaging findings of potential clinical significance: Aortic Atherosclerosis (ICD10-I70.0). Mild cardiomegaly. Postoperative and radiation therapy findings in the right chest. Nonobstructive right nephrolithiasis. Thoracolumbar scoliosis.   Bone Scan Whole Body 07/31/17  IMPRESSION: 1. No scintigraphic evidence of osseous metastatic disease. 2. Thoracolumbar scoliosis.   Diagnostic Mammogram Bilateral at [facility] 07/17/17  IMPRESSION:  There is no mammographic evidence of malignancy. A follow-up mamomgram in 12 months is recommended.    CT Chest W Contrast 01/22/17 IMPRESSION: 1. Stable exam. No new or progressive findings. No evidence for metastatic disease. 2. Left upper lobe architectural distortion/scarring is stable. 3. Nonobstructing right renal stone.    ASSESSMENT & PLAN:  47 y.o.  African-American female, premenopausal, without significant past medical history, presented with palpable right breast mass and right axillary mass.   1. 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 - I previously reviewed her breast surgical pathology results with her in great detail. She has had fantastic partial response (near complete response) form 8 months of chemotherapy and antibody therapy.   The initial 6.3 cm breast mass has only 1 mm  residual tumor,  And the previously 4.4 cm right axillary lymph node has had complete  pathological response, all 7 nodes were negative for cancer. -She has had complete radiographic response also, no evidence of disease on the subsequent staging CT scan. -Continue monitoring cardiac echo every 6 months, she will follow up with Dr. Haroldine Laws.  Her last echo from June 2019 showed normal EF -Her 07/17/17 mammogram was normal, will continue mammogram once a year.  -I have personally reviewed her last restaging CT AP from 02/17/2018 which showed no evidence of disease -Lab reviewed, CBC has been normal. CMP today was pending.  -Continue Herceptin every 3 weeks, labs every 9 weeks -Mammogram and restaging CT in 07/2018 -F/u in 9 weeks   2. Recurrent sinusitis/URI-like symptoms -She has previously had recurrent sinusitis, and bronchitis. Her symptom has resolved lately. -Her symptoms have returned due to allergy season.  -She did have a flare up of this, including sore throat. She was placed on Azithromycin by her PCP and her symptoms have now improved as of (06/04/17). On exam, her throat appears well.  -I also previously encouraged her to rest well and drink adequate amounts of water to assist with this.  -no flare lately   3. Mild peripheral neuropathy, G1 -Probably related to her prior chemotherapy -previously very mild, we'll continue monitoring.  She had a few episodes of tingling on bilateral bottom of feet, which spontaneously resolved after 10-20 mins.  We will continue monitoring  5. HTN -No prior history of HTN, but her BP has been slightly elevated lately  -He was started on losartan by Dr. Haroldine Laws in June 2019 -Blood pressure still elevated today, We will repeat in infusion room. I asked her to monitor her BP at home.   6. Obesity  -I previously encouraged her to have healthy diet, exercise regularly, and try to lose some weight. -I advised her to maintain a healthy diet and exercise regularly.    Plan -Labs reviewed, will proceed Herceptin today  -Continue  Herceptin every 3 weeks  -Lab, flush and f/u in 9 weeks  -Mammogram and restaging CT in 07/2018 will order next visit  -Refilled EMLA cream today  I spent about 20 minutes counseling the patient and her family members, total care was about 25 minutes.  Dierdre Searles [name] [name] am acting as scribe for Dr. [name].  I have reviewed the above documentation for accuracy and completeness, and I agree with the above.   [name]  04/23/2018 3:26 PM

## 2018-04-23 ENCOUNTER — Inpatient Hospital Stay (HOSPITAL_BASED_OUTPATIENT_CLINIC_OR_DEPARTMENT_OTHER): Payer: 59 | Admitting: Hematology

## 2018-04-23 ENCOUNTER — Other Ambulatory Visit: Payer: 59

## 2018-04-23 ENCOUNTER — Encounter: Payer: Self-pay | Admitting: Hematology

## 2018-04-23 ENCOUNTER — Ambulatory Visit: Payer: 59 | Admitting: Hematology

## 2018-04-23 ENCOUNTER — Inpatient Hospital Stay: Payer: 59

## 2018-04-23 ENCOUNTER — Telehealth: Payer: Self-pay | Admitting: Hematology

## 2018-04-23 ENCOUNTER — Inpatient Hospital Stay: Payer: 59 | Attending: Hematology

## 2018-04-23 ENCOUNTER — Ambulatory Visit: Payer: 59

## 2018-04-23 VITALS — BP 159/81 | HR 70 | Temp 98.4°F | Resp 18 | Ht 70.0 in | Wt 230.0 lb

## 2018-04-23 DIAGNOSIS — Z5112 Encounter for antineoplastic immunotherapy: Secondary | ICD-10-CM | POA: Insufficient documentation

## 2018-04-23 DIAGNOSIS — C50911 Malignant neoplasm of unspecified site of right female breast: Secondary | ICD-10-CM

## 2018-04-23 DIAGNOSIS — Z95828 Presence of other vascular implants and grafts: Secondary | ICD-10-CM

## 2018-04-23 DIAGNOSIS — E669 Obesity, unspecified: Secondary | ICD-10-CM | POA: Diagnosis not present

## 2018-04-23 DIAGNOSIS — I10 Essential (primary) hypertension: Secondary | ICD-10-CM | POA: Diagnosis not present

## 2018-04-23 DIAGNOSIS — C7800 Secondary malignant neoplasm of unspecified lung: Principal | ICD-10-CM

## 2018-04-23 DIAGNOSIS — Z171 Estrogen receptor negative status [ER-]: Secondary | ICD-10-CM | POA: Diagnosis not present

## 2018-04-23 DIAGNOSIS — C50511 Malignant neoplasm of lower-outer quadrant of right female breast: Secondary | ICD-10-CM

## 2018-04-23 LAB — CBC WITH DIFFERENTIAL/PLATELET
Basophils Absolute: 0 K/uL (ref 0.0–0.1)
Basophils Relative: 0 %
Eosinophils Absolute: 0.1 K/uL (ref 0.0–0.5)
Eosinophils Relative: 2 %
HCT: 35.9 % (ref 34.8–46.6)
Hemoglobin: 11.9 g/dL (ref 11.6–15.9)
Lymphocytes Relative: 31 %
Lymphs Abs: 1.8 K/uL (ref 0.9–3.3)
MCH: 29.3 pg (ref 25.1–34.0)
MCHC: 33.1 g/dL (ref 31.5–36.0)
MCV: 88.7 fL (ref 79.5–101.0)
Monocytes Absolute: 0.4 K/uL (ref 0.1–0.9)
Monocytes Relative: 7 %
Neutro Abs: 3.5 K/uL (ref 1.5–6.5)
Neutrophils Relative %: 60 %
Platelets: 215 K/uL (ref 145–400)
RBC: 4.05 MIL/uL (ref 3.70–5.45)
RDW: 13.6 % (ref 11.2–14.5)
WBC: 5.8 K/uL (ref 3.9–10.3)

## 2018-04-23 LAB — COMPREHENSIVE METABOLIC PANEL WITH GFR
ALT: 28 U/L (ref 0–44)
AST: 29 U/L (ref 15–41)
Albumin: 3.5 g/dL (ref 3.5–5.0)
Alkaline Phosphatase: 73 U/L (ref 38–126)
Anion gap: 9 (ref 5–15)
BUN: 11 mg/dL (ref 6–20)
CO2: 26 mmol/L (ref 22–32)
Calcium: 9.2 mg/dL (ref 8.9–10.3)
Chloride: 105 mmol/L (ref 98–111)
Creatinine, Ser: 1.04 mg/dL — ABNORMAL HIGH (ref 0.44–1.00)
GFR calc Af Amer: >60 mL/min
GFR calc non Af Amer: >60 mL/min
Glucose, Bld: 102 mg/dL — ABNORMAL HIGH (ref 70–99)
Potassium: 4 mmol/L (ref 3.5–5.1)
Sodium: 140 mmol/L (ref 135–145)
Total Bilirubin: 0.5 mg/dL (ref 0.3–1.2)
Total Protein: 7.7 g/dL (ref 6.5–8.1)

## 2018-04-23 MED ORDER — TRASTUZUMAB CHEMO 150 MG IV SOLR
600.0000 mg | Freq: Once | INTRAVENOUS | Status: AC
  Administered 2018-04-23: 600 mg via INTRAVENOUS
  Filled 2018-04-23: qty 28.57

## 2018-04-23 MED ORDER — SODIUM CHLORIDE 0.9 % IJ SOLN
10.0000 mL | INTRAMUSCULAR | Status: DC | PRN
  Administered 2018-04-23: 10 mL
  Filled 2018-04-23: qty 10

## 2018-04-23 MED ORDER — LIDOCAINE-PRILOCAINE 2.5-2.5 % EX CREA: TOPICAL_CREAM | CUTANEOUS | 2 refills | Status: DC

## 2018-04-23 MED ORDER — HEPARIN SOD (PORK) LOCK FLUSH 100 UNIT/ML IV SOLN
500.0000 [IU] | Freq: Once | INTRAVENOUS | Status: AC | PRN
  Administered 2018-04-23: 500 [IU]
  Filled 2018-04-23: qty 5

## 2018-04-23 MED ORDER — SODIUM CHLORIDE 0.9 % IJ SOLN
10.0000 mL | INTRAMUSCULAR | Status: DC | PRN
  Administered 2018-04-23: 10 mL via INTRAVENOUS
  Filled 2018-04-23: qty 10

## 2018-04-23 MED ORDER — SODIUM CHLORIDE 0.9 % IV SOLN
Freq: Once | INTRAVENOUS | Status: AC
  Administered 2018-04-23: 13:00:00 via INTRAVENOUS
  Filled 2018-04-23: qty 250

## 2018-04-23 NOTE — Telephone Encounter (Signed)
Scheduled appt per 9/11 los - left message for patient with appt date and time and sent reminder letter in the mail with appt date and time.

## 2018-04-23 NOTE — Patient Instructions (Signed)
[facility] Discharge Instructions for Patients Receiving Chemotherapy  Today you received the following chemotherapy agents Herceptin  To help prevent nausea and vomiting after your treatment, we encourage you to take your nausea medication as directed   If you develop nausea and vomiting that is not controlled by your nausea medication, call the clinic.   BELOW ARE SYMPTOMS THAT SHOULD BE REPORTED IMMEDIATELY:  *FEVER GREATER THAN 100.5 F  *CHILLS WITH OR WITHOUT FEVER  NAUSEA AND VOMITING THAT IS NOT CONTROLLED WITH YOUR NAUSEA MEDICATION  *UNUSUAL SHORTNESS OF BREATH  *UNUSUAL BRUISING OR BLEEDING  TENDERNESS IN MOUTH AND THROAT WITH OR WITHOUT PRESENCE OF ULCERS  *URINARY PROBLEMS  *BOWEL PROBLEMS  UNUSUAL RASH Items with * indicate a potential emergency and should be followed up as soon as possible.  Feel free to call the clinic should you have any questions or concerns. The clinic phone number is [phone].  Please show the CHEMO ALERT CARD at check-in to the Emergency Department and triage nurse.   

## 2018-04-30 ENCOUNTER — Telehealth: Payer: Self-pay

## 2018-04-30 NOTE — Telephone Encounter (Signed)
Patient calls stating she has to leave this Sunday for 3 weeks for her job.  She is scheduled for Herceptin on 10/2 but will not be in town.  She wants to know if she cannot get it until 10/11 will this have an adverse effect on her health?

## 2018-04-30 NOTE — Telephone Encounter (Signed)
It is okay to delay herceptin 1 week.  Thanks, Regan Rakers NP

## 2018-05-09 ENCOUNTER — Telehealth: Payer: Self-pay | Admitting: Hematology

## 2018-05-09 NOTE — Telephone Encounter (Signed)
Appts scheduled and patient notified per 9/20 sch msg

## 2018-05-14 ENCOUNTER — Other Ambulatory Visit: Payer: 59

## 2018-05-14 ENCOUNTER — Telehealth: Payer: Self-pay | Admitting: Hematology

## 2018-05-14 ENCOUNTER — Ambulatory Visit: Payer: 59

## 2018-05-14 NOTE — Telephone Encounter (Signed)
Appt for treatment was r/s per patient request per 9/30 staff message

## 2018-05-22 ENCOUNTER — Other Ambulatory Visit: Payer: Self-pay | Admitting: Hematology

## 2018-05-26 ENCOUNTER — Ambulatory Visit: Payer: 59 | Admitting: Hematology

## 2018-05-26 ENCOUNTER — Other Ambulatory Visit: Payer: 59

## 2018-05-26 ENCOUNTER — Inpatient Hospital Stay: Payer: 59 | Attending: Hematology

## 2018-05-26 ENCOUNTER — Inpatient Hospital Stay: Payer: 59

## 2018-05-26 VITALS — BP 158/95 | HR 71 | Temp 98.2°F | Resp 17

## 2018-05-26 DIAGNOSIS — C50911 Malignant neoplasm of unspecified site of right female breast: Secondary | ICD-10-CM | POA: Insufficient documentation

## 2018-05-26 DIAGNOSIS — Z5112 Encounter for antineoplastic immunotherapy: Secondary | ICD-10-CM | POA: Insufficient documentation

## 2018-05-26 DIAGNOSIS — C7800 Secondary malignant neoplasm of unspecified lung: Principal | ICD-10-CM

## 2018-05-26 LAB — COMPREHENSIVE METABOLIC PANEL
ALT: 33 U/L (ref 0–44)
AST: 24 U/L (ref 15–41)
Albumin: 3.6 g/dL (ref 3.5–5.0)
Alkaline Phosphatase: 80 U/L (ref 38–126)
Anion gap: 11 (ref 5–15)
BILIRUBIN TOTAL: 0.5 mg/dL (ref 0.3–1.2)
BUN: 13 mg/dL (ref 6–20)
CALCIUM: 9.7 mg/dL (ref 8.9–10.3)
CO2: 26 mmol/L (ref 22–32)
Chloride: 103 mmol/L (ref 98–111)
Creatinine, Ser: 0.84 mg/dL (ref 0.44–1.00)
GFR calc Af Amer: >60 mL/min (ref >60)
Glucose, Bld: 109 mg/dL — ABNORMAL HIGH (ref 70–99)
POTASSIUM: 3.7 mmol/L (ref 3.5–5.1)
Sodium: 140 mmol/L (ref 135–145)
TOTAL PROTEIN: 8.1 g/dL (ref 6.5–8.1)

## 2018-05-26 LAB — CBC WITH DIFFERENTIAL/PLATELET
Abs Immature Granulocytes: 0.02 10*3/uL (ref 0.00–0.07)
BASOS PCT: 0 %
Basophils Absolute: 0 10*3/uL (ref 0.0–0.1)
Eosinophils Absolute: 0.1 10*3/uL (ref 0.0–0.5)
Eosinophils Relative: 1 %
HEMATOCRIT: 37.5 % (ref 36.0–46.0)
Hemoglobin: 12.6 g/dL (ref 12.0–15.0)
IMMATURE GRANULOCYTES: 0 %
LYMPHS ABS: 2.2 10*3/uL (ref 0.7–4.0)
LYMPHS PCT: 27 %
MCH: 30.1 pg (ref 26.0–34.0)
MCHC: 33.6 g/dL (ref 30.0–36.0)
MCV: 89.7 fL (ref 80.0–100.0)
MONO ABS: 0.6 10*3/uL (ref 0.1–1.0)
MONOS PCT: 8 %
NEUTROS ABS: 5.3 10*3/uL (ref 1.7–7.7)
NEUTROS PCT: 64 %
Platelets: 221 10*3/uL (ref 150–400)
RBC: 4.18 MIL/uL (ref 3.87–5.11)
RDW: 13.5 % (ref 11.5–15.5)
WBC: 8.3 10*3/uL (ref 4.0–10.5)
nRBC: 0 % (ref 0.0–0.2)

## 2018-05-26 MED ORDER — SODIUM CHLORIDE 0.9 % IV SOLN
Freq: Once | INTRAVENOUS | Status: AC
  Administered 2018-05-26: 16:00:00 via INTRAVENOUS
  Filled 2018-05-26: qty 250

## 2018-05-26 MED ORDER — TRASTUZUMAB CHEMO 150 MG IV SOLR
600.0000 mg | Freq: Once | INTRAVENOUS | Status: AC
  Administered 2018-05-26: 600 mg via INTRAVENOUS
  Filled 2018-05-26: qty 28.57

## 2018-05-26 MED ORDER — SODIUM CHLORIDE 0.9 % IJ SOLN
10.0000 mL | INTRAMUSCULAR | Status: DC | PRN
  Administered 2018-05-26: 10 mL
  Filled 2018-05-26: qty 10

## 2018-05-26 MED ORDER — HEPARIN SOD (PORK) LOCK FLUSH 100 UNIT/ML IV SOLN
500.0000 [IU] | Freq: Once | INTRAVENOUS | Status: AC | PRN
  Administered 2018-05-26: 500 [IU]
  Filled 2018-05-26: qty 5

## 2018-05-26 MED ORDER — ACETAMINOPHEN 325 MG PO TABS: 650.0000 mg | ORAL_TABLET | Freq: Once | ORAL | Status: DC

## 2018-05-27 LAB — CANCER ANTIGEN 27.29: CA 27.29: 10.9 U/mL (ref 0.0–38.6)

## 2018-06-04 ENCOUNTER — Ambulatory Visit: Payer: 59

## 2018-06-16 ENCOUNTER — Other Ambulatory Visit: Payer: 59

## 2018-06-16 ENCOUNTER — Ambulatory Visit: Payer: 59 | Admitting: Hematology

## 2018-06-16 ENCOUNTER — Ambulatory Visit: Payer: 59

## 2018-06-18 ENCOUNTER — Inpatient Hospital Stay: Payer: 59 | Attending: Hematology

## 2018-06-18 ENCOUNTER — Other Ambulatory Visit: Payer: 59

## 2018-06-18 ENCOUNTER — Ambulatory Visit: Payer: 59 | Admitting: Nurse Practitioner

## 2018-06-18 VITALS — BP 139/79 | HR 72 | Temp 99.1°F | Resp 18 | Ht 70.0 in | Wt 237.0 lb

## 2018-06-18 DIAGNOSIS — C50911 Malignant neoplasm of unspecified site of right female breast: Secondary | ICD-10-CM | POA: Diagnosis present

## 2018-06-18 DIAGNOSIS — C7800 Secondary malignant neoplasm of unspecified lung: Secondary | ICD-10-CM

## 2018-06-18 DIAGNOSIS — Z171 Estrogen receptor negative status [ER-]: Secondary | ICD-10-CM | POA: Diagnosis not present

## 2018-06-18 DIAGNOSIS — I10 Essential (primary) hypertension: Secondary | ICD-10-CM | POA: Diagnosis not present

## 2018-06-18 DIAGNOSIS — N61 Mastitis without abscess: Secondary | ICD-10-CM | POA: Insufficient documentation

## 2018-06-18 DIAGNOSIS — B373 Candidiasis of vulva and vagina: Secondary | ICD-10-CM | POA: Insufficient documentation

## 2018-06-18 DIAGNOSIS — C7802 Secondary malignant neoplasm of left lung: Secondary | ICD-10-CM | POA: Insufficient documentation

## 2018-06-18 DIAGNOSIS — Z5112 Encounter for antineoplastic immunotherapy: Secondary | ICD-10-CM | POA: Diagnosis present

## 2018-06-18 MED ORDER — ACETAMINOPHEN 325 MG PO TABS
ORAL_TABLET | ORAL | Status: AC
  Filled 2018-06-18: qty 2

## 2018-06-18 MED ORDER — SODIUM CHLORIDE 0.9% FLUSH
10.0000 mL | INTRAVENOUS | Status: DC | PRN
  Administered 2018-06-18: 10 mL
  Filled 2018-06-18: qty 10

## 2018-06-18 MED ORDER — HEPARIN SOD (PORK) LOCK FLUSH 100 UNIT/ML IV SOLN
500.0000 [IU] | Freq: Once | INTRAVENOUS | Status: AC | PRN
  Administered 2018-06-18: 500 [IU]
  Filled 2018-06-18: qty 5

## 2018-06-18 MED ORDER — TRASTUZUMAB CHEMO 150 MG IV SOLR
600.0000 mg | Freq: Once | INTRAVENOUS | Status: AC
  Administered 2018-06-18: 600 mg via INTRAVENOUS
  Filled 2018-06-18: qty 28.57

## 2018-06-18 MED ORDER — ACETAMINOPHEN 325 MG PO TABS: 650.0000 mg | ORAL_TABLET | Freq: Once | ORAL | Status: DC

## 2018-06-18 MED ORDER — SODIUM CHLORIDE 0.9 % IJ SOLN: 10.0000 mL | INTRAMUSCULAR | Status: DC | PRN

## 2018-06-18 MED ORDER — SODIUM CHLORIDE 0.9 % IV SOLN
Freq: Once | INTRAVENOUS | Status: AC
  Administered 2018-06-18: 16:00:00 via INTRAVENOUS
  Filled 2018-06-18: qty 250

## 2018-06-18 NOTE — Progress Notes (Signed)
Per Regan Rakers Burton-NP: OK to treat with echo from 01/20/18. Pt now gets echos every 6 months.

## 2018-06-18 NOTE — Patient Instructions (Signed)
[facility] Discharge Instructions for Patients Receiving Chemotherapy  Today you received the following chemotherapy agents: Trastuzumab (Herceptin)  To help prevent nausea and vomiting after your treatment, we encourage you to take your nausea medication as directed.    If you develop nausea and vomiting that is not controlled by your nausea medication, call the clinic.   BELOW ARE SYMPTOMS THAT SHOULD BE REPORTED IMMEDIATELY:  *FEVER GREATER THAN 100.5 F  *CHILLS WITH OR WITHOUT FEVER  NAUSEA AND VOMITING THAT IS NOT CONTROLLED WITH YOUR NAUSEA MEDICATION  *UNUSUAL SHORTNESS OF BREATH  *UNUSUAL BRUISING OR BLEEDING  TENDERNESS IN MOUTH AND THROAT WITH OR WITHOUT PRESENCE OF ULCERS  *URINARY PROBLEMS  *BOWEL PROBLEMS  UNUSUAL RASH Items with * indicate a potential emergency and should be followed up as soon as possible.  Feel free to call the clinic should you have any questions or concerns. The clinic phone number is [phone].  Please show the CHEMO ALERT CARD at check-in to the Emergency Department and triage nurse.    

## 2018-06-25 ENCOUNTER — Ambulatory Visit: Payer: 59 | Admitting: Hematology

## 2018-06-25 ENCOUNTER — Ambulatory Visit: Payer: 59

## 2018-06-25 ENCOUNTER — Other Ambulatory Visit: Payer: 59

## 2018-07-04 ENCOUNTER — Inpatient Hospital Stay (HOSPITAL_BASED_OUTPATIENT_CLINIC_OR_DEPARTMENT_OTHER): Payer: 59 | Admitting: Hematology

## 2018-07-04 ENCOUNTER — Telehealth: Payer: Self-pay

## 2018-07-04 ENCOUNTER — Encounter: Payer: Self-pay | Admitting: Hematology

## 2018-07-04 VITALS — BP 138/94 | HR 104 | Temp 99.1°F | Resp 18 | Ht 70.0 in | Wt 230.9 lb

## 2018-07-04 DIAGNOSIS — N61 Mastitis without abscess: Secondary | ICD-10-CM | POA: Insufficient documentation

## 2018-07-04 DIAGNOSIS — B373 Candidiasis of vulva and vagina: Secondary | ICD-10-CM

## 2018-07-04 DIAGNOSIS — Z171 Estrogen receptor negative status [ER-]: Secondary | ICD-10-CM

## 2018-07-04 DIAGNOSIS — C50911 Malignant neoplasm of unspecified site of right female breast: Secondary | ICD-10-CM

## 2018-07-04 DIAGNOSIS — I10 Essential (primary) hypertension: Secondary | ICD-10-CM | POA: Diagnosis not present

## 2018-07-04 DIAGNOSIS — C7802 Secondary malignant neoplasm of left lung: Secondary | ICD-10-CM

## 2018-07-04 DIAGNOSIS — C7800 Secondary malignant neoplasm of unspecified lung: Principal | ICD-10-CM

## 2018-07-04 MED ORDER — AMOXICILLIN-POT CLAVULANATE 875-125 MG PO TABS: 1.0000 | ORAL_TABLET | Freq: Two times a day (BID) | ORAL | 0 refills | Status: DC

## 2018-07-04 MED ORDER — FLUCONAZOLE 100 MG PO TABS: 100.0000 mg | ORAL_TABLET | Freq: Every day | ORAL | 0 refills | Status: DC

## 2018-07-04 NOTE — Progress Notes (Signed)
[facility]   [phone] [phone]   Clinic Follow up Note   Patient Care Team: System, Pcp Not In as PCP - [place], [place], DO (Family Medicine) Holley Bouche, NP (Inactive) as Nurse Practitioner (Nurse Practitioner) Stark Klein, MD as Consulting Physician (General Surgery) Arloa Koh, MD as Consulting Physician (Radiation Oncology) [name], MD as Consulting Physician (Hematology)  Date of Service:  07/04/2018  CHIEF COMPLAINT: right breast pain and redness  SUMMARY OF ONCOLOGIC HISTORY: Oncology History   Breast cancer 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   Staging form: Breast, AJCC 7th Edition     Clinical stage from 07/22/2014: Stage IV (T3, N1, M1) - Unsigned       Breast cancer 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 ([place])   07/02/2014 Mammogram    Mammogram showed a 2cm right beast mass and a 1.8cm right axillary node. MRI breast on 07/16/2014 showed 7cm R breast lesion and 4.4cm r axillary node     07/02/2014 Imaging    CT CAP: a 4.7cm mass in LUL lung and a 2.1cm mas in RML, and a small nodule in RUL, suspecious for metastasis      07/09/2014 Initial Diagnosis    right IDA with b/l lung lesions, ER-/PR-/HER2+    07/09/2014 Initial Biopsy    US guided right breast mass and axillary node biopsy showed IDA, and DCIS, ER-/PR-/HER2+    07/26/2014 Pathologic Stage    Left lung mass by IR, path revealed high grade carcinoma, morphology similar to breast tumor biopsy, TTF(-), NapsinA(-), ER(-)    08/04/2014 - 03/22/2015 Chemotherapy    weekly Paclitaxel 77m/m2, trastuzumab and pertuzumab every 3 weeks    08/30/2014 Genetic Testing    BreastNext panel was negative. 17 genes including BRCA1, BRCA2, were negative for mutations.     10/04/2014 Imaging    Interval decrease in the right axillary lymphadenopathy. Bilateral pulmonary lesions with left hilar lymphadenopathy also markedly decreased in the interval. The left hilar lymphadenopathy has  resolved.    12/20/2014 Imaging    restaging CT showed stable disease, no new lesions     03/29/2015 Pathology Results     right breast lumpectomy showed  chemotherapy treatment effect,  a 1 mm residual tumor,   margins were widely negative, 5 sentinel lymph nodes and 2 axillary lymph nodes were negative.     03/29/2015 Surgery     right breast lumpectomy and sentinel lymph node biopsy  by Dr. BBarry Dienes   04/12/2015 -  Chemotherapy     Herceptin maintenance therapy , 6 mg/kg, every 3 weeks    05/03/2015 - 06/14/2015 Radiation Therapy    right breast adjuvant irradiation by Dr. MValere Dross    08/28/2016 Imaging    CT CAP w Contrast  IMPRESSION: 1. No acute process or evidence of metastatic disease within the chest, abdomen, or pelvis. 2. Similar to less well-defined left upper lobe density, likely scarring. No evidence of new or progressive pulmonary metastasis. 3. Right nephrolithiasis.    01/22/2017 Imaging    CT Chest W Contrast 01/22/17 IMPRESSION: 1. Stable exam. No new or progressive findings. No evidence for metastatic disease. 2. Left upper lobe architectural distortion/scarring is stable. 3. Nonobstructing right renal stone.    07/31/2017 Imaging    Bone Scan Whole Body 07/31/17  IMPRESSION: 1. No scintigraphic evidence of osseous metastatic disease. 2. Thoracolumbar scoliosis.     07/31/2017 Imaging    CT CAP W Contrast 07/31/17 IMPRESSION: 1. No findings of active or recurrent  malignancy. 2. Other imaging findings of potential clinical significance: Aortic Atherosclerosis (ICD10-I70.0). Mild cardiomegaly. Postoperative and radiation therapy findings in the right chest. Nonobstructive right nephrolithiasis. Thoracolumbar scoliosis.     01/20/2018 Echocardiogram    ECHO 01/20/2018 Study Conclusions  - Left ventricle: The cavity size was normal. Wall thickness was   normal. Systolic function was normal. The estimated ejection   fraction was in the range of 55% to 60%.  Wall motion was normal;   there were no regional wall motion abnormalities. Doppler   parameters are consistent with abnormal left ventricular   relaxation (grade 1 diastolic dysfunction). - Impressions: GLS -20.7% LS&' 10.4 cm.     02/17/2018 Imaging    02/17/2018 CT CAP IMPRESSION: 1. No evidence for residual or recurrent tumor or metastatic disease. 2.  Aortic Atherosclerosis (ICD10-I70.0). 3. Right renal calculi. 4. Scoliosis.      CURRENT THERAPY:  Maintenance Herceptin q3weeks   INTERVAL HISTORY:  [name] [name] called this morning and requested an urgent visit to evaluate her right breast pain, and possible breat lump. She notes recently being diagnosed with a virus infection by her PCP yesteray. She had a fever up to 102F with chills and body aches 2 days ago, since then it has reduced down to 99.61F today. She was able to be seen with PCP.   2 nights ago she started feeling right nipple irritation. Since then she notes feeling pulling and noting erythema below her right nipple where she now feels a lumps.   She notes over a months ago having an outbreak of eczema after getting bites during a vacation to [place].    REVIEW OF SYSTEMS:   Constitutional: Denies fevers or abnormal weight loss (+) low grade fever (+) chills  Eyes: Denies blurriness of vision Ears, nose, mouth, throat, and face: Denies mucositis or sore throat Respiratory: Denies cough, dyspnea or wheezes Cardiovascular: Denies palpitation, chest discomfort or lower extremity swelling Gastrointestinal:  Denies nausea, heartburn or change in bowel habits Skin: Denies abnormal skin rashes MKS: (+) Body aches  Lymphatics: Denies new lymphadenopathy or easy bruising Neurological:Denies numbness, tingling or new weaknesses Behavioral/Psych: Mood is stable, no new changes  BREAST: (+) Skin erythema, warm to touch with palpable lump below nipple, warm to touch  All other systems were reviewed with the patient and  are negative.  MEDICAL HISTORY:  Past Medical History:  Diagnosis Date  . Breast cancer ([place])   . Breast cancer ([place])   . GERD (gastroesophageal reflux disease)    during pregnancy   . Pneumonia    hx of pneumonia 08/2013   . S/P radiation therapy 05/03/2015 through 06/14/2015    Right breast 4680 cGy in 26 sessions, right breast boost 1000 cGy in 5 sessions. Right supraclavicular/axillary region 4680 cGy with a supplemental PA field to bring the axillary dose up to 4500 cGy in 26 sessions    SURGICAL HISTORY: Past Surgical History:  Procedure Laterality Date  . BREAST LUMPECTOMY WITH RADIOACTIVE SEED AND SENTINEL LYMPH NODE BIOPSY Right 03/29/2015   Procedure: BREAST LUMPECTOMY WITH RADIOACTIVE SEED AND SENTINEL LYMPH NODE BIOPSY;  Surgeon: Stark Klein, MD;  Location: [facility];  Service: General;  Laterality: Right;  . CESAREAN SECTION    . CHOLECYSTECTOMY    . ESSURE TUBAL LIGATION    . PORTACATH PLACEMENT Left 07/21/2014   Procedure: INSERTION PORT-A-CATH;  Surgeon: Stark Klein, MD;  Location: [facility];  Service: General;  Laterality: Left;  . WISDOM TOOTH EXTRACTION  I have reviewed the social history and family history with the patient and they are unchanged from previous note.  ALLERGIES:  is allergic to adhesive [tape]; aspirin; and codeine.  MEDICATIONS:  Current Outpatient Medications  Medication Sig Dispense Refill  . Calcium-Phosphorus-Vitamin D (CALCIUM GUMMIES PO) Take by mouth daily.    . Camphor-Eucalyptus-Menthol (VICKS VAPORUB EX) Apply 1 application topically at bedtime. Applies under nose, throat and on chest.    . cetirizine (ZYRTEC) 10 MG tablet Take 10 mg by mouth daily as needed for allergies (allergies).     . hydrocortisone 2.5 % cream APPLY TOPICALLY TO THE AFFECTED AREA TWICE DAILY 454 g 0  . ibuprofen (ADVIL,MOTRIN) 800 MG tablet Take 1 tablet (800 mg total) by mouth every 8 (eight)  hours as needed. 60 tablet 1  . lidocaine-prilocaine (EMLA) cream APPLY TOPICALLY TO THE AFFECTED AREA AS NEEDED 30 g 2  . loperamide (IMODIUM A-D) 2 MG tablet Take 1 tablet (2 mg total) by mouth 4 (four) times daily as needed for diarrhea or loose stools. 30 tablet 0  . metaxalone (SKELAXIN) 800 MG tablet Take 1 tablet (800 mg total) by mouth 3 (three) times daily as needed for muscle spasms. 60 tablet 1  . Multiple Vitamins-Minerals (AIRBORNE PO) Take 1 tablet by mouth daily.    . Multiple Vitamins-Minerals (MULTIVITAMIN GUMMIES ADULT PO) Take 1 each by mouth daily. Women's Vitafusion gummie    . triamcinolone (KENALOG) 0.025 % ointment Apply 1 application topically 2 (two) times daily. 30 g 1  . amoxicillin-clavulanate (AUGMENTIN) 875-125 MG tablet Take 1 tablet by mouth 2 (two) times daily. 20 tablet 0  . fluconazole (DIFLUCAN) 100 MG tablet Take 1 tablet (100 mg total) by mouth daily. 1 tablet 0  . losartan (COZAAR) 25 MG tablet Take 1 tablet (25 mg total) by mouth daily. 30 tablet 11   No current facility-administered medications for this visit.    Facility-Administered Medications Ordered in Other Visits  Medication Dose Route Frequency Provider Last Rate Last Dose  . acetaminophen (TYLENOL) tablet 650 mg  650 mg Oral Once [name], MD      . sodium chloride 0.9 % injection 10 mL  10 mL Intracatheter PRN [name], MD   10 mL at 05/01/16 1622  . sodium chloride 0.9 % injection 10 mL  10 mL Intravenous PRN [name], MD        PHYSICAL EXAMINATION: ECOG PERFORMANCE STATUS: 1 - Symptomatic but completely ambulatory  Vitals:   07/04/18 1344  BP: (!) 138/94  Pulse: (!) 104  Resp: 18  Temp: 99.1 F (37.3 C)  SpO2: 98%   Filed Weights   07/04/18 1344  Weight: 230 lb 14.4 oz (104.7 kg)    GENERAL:alert, no distress and comfortable SKIN: skin color, texture, turgor are normal, no rashes or significant lesions EYES: normal, Conjunctiva are pink and non-injected, sclera  clear OROPHARYNX:no exudate, no erythema and lips, buccal mucosa, and tongue normal  NECK: supple, thyroid normal size, non-tender, without nodularity LYMPH:  no palpable lymphadenopathy in the cervical, axillary or inguinal LUNGS: clear to auscultation and percussion with normal breathing effort HEART: regular rate & rhythm and no murmurs and no lower extremity edema ABDOMEN:abdomen soft, non-tender and normal bowel sounds Musculoskeletal:no cyanosis of digits and no clubbing  NEURO: alert & oriented x 3 with fluent speech, no focal motor/sensory deficits BREAST: (+) Diffuse skin erythema in inferior right breast below nipple with mild lymphedema and tenderness, no palpable mass.  LABORATORY DATA:  I have reviewed the data as listed CBC Latest Ref Rng & Units 05/26/2018 04/23/2018 02/19/2018  WBC 4.0 - 10.5 K/uL 8.3 5.8 7.2  Hemoglobin 12.0 - 15.0 g/dL 12.6 11.9 11.9  Hematocrit 36.0 - 46.0 % 37.5 35.9 35.5  Platelets 150 - 400 K/uL 221 215 197     CMP Latest Ref Rng & Units 05/26/2018 04/23/2018 02/19/2018  Glucose 70 - 99 mg/dL 109(H) 102(H) 101(H)  BUN 6 - 20 mg/dL 13 11 10   Creatinine 0.44 - 1.00 mg/dL 0.84 1.04(H) 0.83  Sodium 135 - 145 mmol/L 140 140 138  Potassium 3.5 - 5.1 mmol/L 3.7 4.0 3.6  Chloride 98 - 111 mmol/L 103 105 104  CO2 22 - 32 mmol/L 26 26 26   Calcium 8.9 - 10.3 mg/dL 9.7 9.2 8.7(L)  Total Protein 6.5 - 8.1 g/dL 8.1 7.7 7.7  Total Bilirubin 0.3 - 1.2 mg/dL 0.5 0.5 0.3  Alkaline Phos 38 - 126 U/L 80 73 76  AST 15 - 41 U/L 24 29 18   ALT 0 - 44 U/L 33 28 19      RADIOGRAPHIC STUDIES: I have personally reviewed the radiological images as listed and agreed with the findings in the report. No results found.   ASSESSMENT & PLAN:  [name] [name] is a 47 y.o. female with    1. Cellulitis of right breast -Her breast exam is most consistent with cellulitis.  She also had a fever and chills 2 days ago.   -I will prescribe her Augmentin and prophylactic  Diflucan today (07/04/18) -She knows to call back if no improvement with Augmentin.  I may consider breast ultrasound to rule out abscess.  2.  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 tolerating well.  -She is due for treatment next week  3. HTN -On losartan giving by Dr. BHaroldine Lawsin June 2019 -BP at 138/94 today (07/04/18)   Plan -I prescribed her Augmentin 8756mbid for 10 days today. She frequently gets Candida vaginitis after antibiotics, I also called in 1 dose Diflucan today -lab, flush, f/u and Herceptin on 11/27 as scheduled     No problem-specific Assessment & Plan notes found for this encounter.   No orders of the defined types were placed in this encounter.  All questions were answered. The patient knows to call the clinic with any problems, questions or concerns. No barriers to learning was detected.  I spent 15 minutes counseling the patient face to face. The total time spent in the appointment was 20 minutes and more than 50% was on counseling and review of test results     YaTruitt MerleMD 07/04/2018   I, AmJoslyn Devonam acting as scribe for YaTruitt MerleMD.   I have reviewed the above documentation for accuracy and completeness, and I agree with the above.

## 2018-07-04 NOTE — Telephone Encounter (Signed)
Patient calls stating she has found a lump in breast with tenderness in nipple area.  Would like to be seen earlier than scheduled.  Called patient back and instructed to come in today at 1:30.

## 2018-07-07 ENCOUNTER — Ambulatory Visit: Payer: 59 | Admitting: Hematology

## 2018-07-07 ENCOUNTER — Other Ambulatory Visit: Payer: 59

## 2018-07-07 ENCOUNTER — Ambulatory Visit: Payer: 59

## 2018-07-09 ENCOUNTER — Encounter: Payer: Self-pay | Admitting: Hematology

## 2018-07-09 ENCOUNTER — Inpatient Hospital Stay: Payer: 59

## 2018-07-09 ENCOUNTER — Inpatient Hospital Stay (HOSPITAL_BASED_OUTPATIENT_CLINIC_OR_DEPARTMENT_OTHER): Payer: 59 | Admitting: Hematology

## 2018-07-09 VITALS — BP 139/82 | HR 72 | Temp 98.4°F | Resp 18 | Ht 70.0 in | Wt 234.4 lb

## 2018-07-09 DIAGNOSIS — B373 Candidiasis of vulva and vagina: Secondary | ICD-10-CM

## 2018-07-09 DIAGNOSIS — I10 Essential (primary) hypertension: Secondary | ICD-10-CM | POA: Diagnosis not present

## 2018-07-09 DIAGNOSIS — C7802 Secondary malignant neoplasm of left lung: Secondary | ICD-10-CM

## 2018-07-09 DIAGNOSIS — C7800 Secondary malignant neoplasm of unspecified lung: Principal | ICD-10-CM

## 2018-07-09 DIAGNOSIS — Z171 Estrogen receptor negative status [ER-]: Secondary | ICD-10-CM

## 2018-07-09 DIAGNOSIS — C50911 Malignant neoplasm of unspecified site of right female breast: Secondary | ICD-10-CM | POA: Diagnosis not present

## 2018-07-09 DIAGNOSIS — N61 Mastitis without abscess: Secondary | ICD-10-CM | POA: Diagnosis not present

## 2018-07-09 DIAGNOSIS — Z5112 Encounter for antineoplastic immunotherapy: Secondary | ICD-10-CM | POA: Diagnosis not present

## 2018-07-09 LAB — CBC WITH DIFFERENTIAL/PLATELET
Abs Immature Granulocytes: 0.02 10*3/uL (ref 0.00–0.07)
BASOS PCT: 0 %
Basophils Absolute: 0 10*3/uL (ref 0.0–0.1)
Eosinophils Absolute: 0.1 10*3/uL (ref 0.0–0.5)
Eosinophils Relative: 1 %
HCT: 34.2 % — ABNORMAL LOW (ref 36.0–46.0)
Hemoglobin: 11.4 g/dL — ABNORMAL LOW (ref 12.0–15.0)
IMMATURE GRANULOCYTES: 0 %
Lymphocytes Relative: 30 %
Lymphs Abs: 1.9 10*3/uL (ref 0.7–4.0)
MCH: 29.7 pg (ref 26.0–34.0)
MCHC: 33.3 g/dL (ref 30.0–36.0)
MCV: 89.1 fL (ref 80.0–100.0)
MONOS PCT: 8 %
Monocytes Absolute: 0.5 10*3/uL (ref 0.1–1.0)
NEUTROS PCT: 61 %
Neutro Abs: 3.8 10*3/uL (ref 1.7–7.7)
Platelets: 260 10*3/uL (ref 150–400)
RBC: 3.84 MIL/uL — AB (ref 3.87–5.11)
RDW: 12.5 % (ref 11.5–15.5)
WBC: 6.4 10*3/uL (ref 4.0–10.5)
nRBC: 0 % (ref 0.0–0.2)

## 2018-07-09 LAB — COMPREHENSIVE METABOLIC PANEL
ALK PHOS: 72 U/L (ref 38–126)
ALT: 20 U/L (ref 0–44)
ANION GAP: 9 (ref 5–15)
AST: 17 U/L (ref 15–41)
Albumin: 3.5 g/dL (ref 3.5–5.0)
BUN: 11 mg/dL (ref 6–20)
CALCIUM: 9 mg/dL (ref 8.9–10.3)
CO2: 27 mmol/L (ref 22–32)
Chloride: 105 mmol/L (ref 98–111)
Creatinine, Ser: 0.79 mg/dL (ref 0.44–1.00)
Glucose, Bld: 101 mg/dL — ABNORMAL HIGH (ref 70–99)
Potassium: 3.8 mmol/L (ref 3.5–5.1)
Sodium: 141 mmol/L (ref 135–145)
TOTAL PROTEIN: 7.6 g/dL (ref 6.5–8.1)
Total Bilirubin: 0.2 mg/dL — ABNORMAL LOW (ref 0.3–1.2)

## 2018-07-09 MED ORDER — TRASTUZUMAB CHEMO 150 MG IV SOLR
600.0000 mg | Freq: Once | INTRAVENOUS | Status: AC
  Administered 2018-07-09: 600 mg via INTRAVENOUS
  Filled 2018-07-09: qty 28.57

## 2018-07-09 MED ORDER — SODIUM CHLORIDE 0.9 % IV SOLN
Freq: Once | INTRAVENOUS | Status: AC
  Administered 2018-07-09: 15:00:00 via INTRAVENOUS
  Filled 2018-07-09: qty 250

## 2018-07-09 MED ORDER — ACETAMINOPHEN 325 MG PO TABS: 650.0000 mg | ORAL_TABLET | Freq: Once | ORAL | Status: DC

## 2018-07-09 MED ORDER — HEPARIN SOD (PORK) LOCK FLUSH 100 UNIT/ML IV SOLN
500.0000 [IU] | Freq: Once | INTRAVENOUS | Status: AC | PRN
  Administered 2018-07-09: 500 [IU]
  Filled 2018-07-09: qty 5

## 2018-07-09 MED ORDER — SODIUM CHLORIDE 0.9 % IJ SOLN: 10.0000 mL | INTRAMUSCULAR | Status: DC | PRN

## 2018-07-09 MED ORDER — SODIUM CHLORIDE 0.9% FLUSH
10.0000 mL | Freq: Once | INTRAVENOUS | Status: AC
  Administered 2018-07-09: 10 mL via INTRAVENOUS
  Filled 2018-07-09: qty 10

## 2018-07-09 NOTE — Progress Notes (Signed)
[place]   [phone] [phone]) [phone]   Clinic Follow up Note   Patient Care Team: System, Pcp Not In as PCP - Ethan, Hauser, DO (Family Medicine) Holley Bouche, NP (Inactive) as Nurse Practitioner (Nurse Practitioner) Stark Klein, MD as Consulting Physician (General Surgery) Arloa Koh, MD as Consulting Physician (Radiation Oncology) [name], MD as Consulting Physician (Hematology)  Date of Service:  07/09/2018  CHIEF COMPLAINT: Right breast cellulitis and Herceptin treatment   SUMMARY OF ONCOLOGIC HISTORY: Oncology History   Breast cancer 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   Staging form: Breast, AJCC 7th Edition     Clinical stage from 07/22/2014: Stage IV (T3, N1, M1) - Unsigned       Breast cancer 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 ([place])   07/02/2014 Mammogram    Mammogram showed a 2cm right beast mass and a 1.8cm right axillary node. MRI breast on 07/16/2014 showed 7cm R breast lesion and 4.4cm r axillary node     07/02/2014 Imaging    CT CAP: a 4.7cm mass in LUL lung and a 2.1cm mas in RML, and a small nodule in RUL, suspecious for metastasis      07/09/2014 Initial Diagnosis    right IDA with b/l lung lesions, ER-/PR-/HER2+    07/09/2014 Initial Biopsy    US guided right breast mass and axillary node biopsy showed IDA, and DCIS, ER-/PR-/HER2+    07/26/2014 Pathologic Stage    Left lung mass by IR, path revealed high grade carcinoma, morphology similar to breast tumor biopsy, TTF(-), NapsinA(-), ER(-)    08/04/2014 - 03/22/2015 Chemotherapy    weekly Paclitaxel '80mg'$ /m2, trastuzumab and pertuzumab every 3 weeks    08/30/2014 Genetic Testing    BreastNext panel was negative. 17 genes including BRCA1, BRCA2, were negative for mutations.     10/04/2014 Imaging    Interval decrease in the right axillary lymphadenopathy. Bilateral pulmonary lesions with left hilar lymphadenopathy also markedly decreased in the interval. The left hilar  lymphadenopathy has resolved.    12/20/2014 Imaging    restaging CT showed stable disease, no new lesions     03/29/2015 Pathology Results     right breast lumpectomy showed  chemotherapy treatment effect,  a 1 mm residual tumor,   margins were widely negative, 5 sentinel lymph nodes and 2 axillary lymph nodes were negative.     03/29/2015 Surgery     right breast lumpectomy and sentinel lymph node biopsy  by Dr. Barry Dienes    04/12/2015 -  Chemotherapy     Herceptin maintenance therapy , 6 mg/kg, every 3 weeks    05/03/2015 - 06/14/2015 Radiation Therapy    right breast adjuvant irradiation by Dr. Valere Dross     08/28/2016 Imaging    CT CAP w Contrast  IMPRESSION: 1. No acute process or evidence of metastatic disease within the chest, abdomen, or pelvis. 2. Similar to less well-defined left upper lobe density, likely scarring. No evidence of new or progressive pulmonary metastasis. 3. Right nephrolithiasis.    01/22/2017 Imaging    CT Chest W Contrast 01/22/17 IMPRESSION: 1. Stable exam. No new or progressive findings. No evidence for metastatic disease. 2. Left upper lobe architectural distortion/scarring is stable. 3. Nonobstructing right renal stone.    07/31/2017 Imaging    Bone Scan Whole Body 07/31/17  IMPRESSION: 1. No scintigraphic evidence of osseous metastatic disease. 2. Thoracolumbar scoliosis.     07/31/2017 Imaging    CT CAP W Contrast 07/31/17 IMPRESSION: 1. No findings of active  or recurrent malignancy. 2. Other imaging findings of potential clinical significance: Aortic Atherosclerosis (ICD10-I70.0). Mild cardiomegaly. Postoperative and radiation therapy findings in the right chest. Nonobstructive right nephrolithiasis. Thoracolumbar scoliosis.     01/20/2018 Echocardiogram    ECHO 01/20/2018 Study Conclusions  - Left ventricle: The cavity size was normal. Wall thickness was   normal. Systolic function was normal. The estimated ejection   fraction was in the  range of 55% to 60%. Wall motion was normal;   there were no regional wall motion abnormalities. Doppler   parameters are consistent with abnormal left ventricular   relaxation (grade 1 diastolic dysfunction). - Impressions: GLS -20.7% LS&' 10.4 cm.     02/17/2018 Imaging    02/17/2018 CT CAP IMPRESSION: 1. No evidence for residual or recurrent tumor or metastatic disease. 2.  Aortic Atherosclerosis (ICD10-I70.0). 3. Right renal calculi. 4. Scoliosis.      CURRENT THERAPY:  Maintenance Herceptin every 3 weeks since 04/12/15  INTERVAL HISTORY:  [name] [name] is here for a follow up of her right breast cellulitis and Herceptin treatment. She presents to the clinic today accompanied by her daughter. She notes she feels better. Her right breast still feels tender (3/10) but no overall pain. She notes with the Augmentin she has to break into 4 pieces to take.    REVIEW OF SYSTEMS:   Constitutional: Denies fevers, chills or abnormal weight loss Eyes: Denies blurriness of vision Ears, nose, mouth, throat, and face: Denies mucositis or sore throat Respiratory: Denies cough, dyspnea or wheezes Cardiovascular: Denies palpitation, chest discomfort or lower extremity swelling Gastrointestinal:  Denies nausea, heartburn or change in bowel habits Skin: Denies abnormal skin rashes Lymphatics: Denies new lymphadenopathy or easy bruising Neurological:Denies numbness, tingling or new weaknesses Behavioral/Psych: Mood is stable, no new changes  BREAST: Tenderness in right breast (3/10) All other systems were reviewed with the patient and are negative.  MEDICAL HISTORY:  Past Medical History:  Diagnosis Date  . Breast cancer ([place])   . Breast cancer ([place])   . GERD (gastroesophageal reflux disease)    during pregnancy   . Pneumonia    hx of pneumonia 08/2013   . S/P radiation therapy 05/03/2015 through 06/14/2015    Right breast 4680 cGy in  26 sessions, right breast boost 1000 cGy in 5 sessions. Right supraclavicular/axillary region 4680 cGy with a supplemental PA field to bring the axillary dose up to 4500 cGy in 26 sessions    SURGICAL HISTORY: Past Surgical History:  Procedure Laterality Date  . BREAST LUMPECTOMY WITH RADIOACTIVE SEED AND SENTINEL LYMPH NODE BIOPSY Right 03/29/2015   Procedure: BREAST LUMPECTOMY WITH RADIOACTIVE SEED AND SENTINEL LYMPH NODE BIOPSY;  Surgeon: Stark Klein, MD;  Location: [facility];  Service: General;  Laterality: Right;  . CESAREAN SECTION    . CHOLECYSTECTOMY    . ESSURE TUBAL LIGATION    . PORTACATH PLACEMENT Left 07/21/2014   Procedure: INSERTION PORT-A-CATH;  Surgeon: Stark Klein, MD;  Location: [facility];  Service: General;  Laterality: Left;  . WISDOM TOOTH EXTRACTION      I have reviewed the social history and family history with the patient and they are unchanged from previous note.  ALLERGIES:  is allergic to adhesive [tape]; aspirin; and codeine.  MEDICATIONS:  Current Outpatient Medications  Medication Sig Dispense Refill  . amoxicillin-clavulanate (AUGMENTIN) 875-125 MG tablet Take 1 tablet by mouth 2 (two) times daily. 20 tablet 0  . Calcium-Phosphorus-Vitamin D (CALCIUM GUMMIES PO) Take by mouth daily.    Marland Kitchen  Camphor-Eucalyptus-Menthol (VICKS VAPORUB EX) Apply 1 application topically at bedtime. Applies under nose, throat and on chest.    . cetirizine (ZYRTEC) 10 MG tablet Take 10 mg by mouth daily as needed for allergies (allergies).     . fluconazole (DIFLUCAN) 100 MG tablet Take 1 tablet (100 mg total) by mouth daily. 1 tablet 0  . hydrocortisone 2.5 % cream APPLY TOPICALLY TO THE AFFECTED AREA TWICE DAILY 454 g 0  . ibuprofen (ADVIL,MOTRIN) 800 MG tablet Take 1 tablet (800 mg total) by mouth every 8 (eight) hours as needed. 60 tablet 1  . lidocaine-prilocaine (EMLA) cream APPLY TOPICALLY TO THE AFFECTED AREA AS NEEDED 30 g 2  . loperamide (IMODIUM A-D) 2 MG  tablet Take 1 tablet (2 mg total) by mouth 4 (four) times daily as needed for diarrhea or loose stools. 30 tablet 0  . metaxalone (SKELAXIN) 800 MG tablet Take 1 tablet (800 mg total) by mouth 3 (three) times daily as needed for muscle spasms. 60 tablet 1  . Multiple Vitamins-Minerals (AIRBORNE PO) Take 1 tablet by mouth daily.    . Multiple Vitamins-Minerals (MULTIVITAMIN GUMMIES ADULT PO) Take 1 each by mouth daily. Women's Vitafusion gummie    . triamcinolone (KENALOG) 0.025 % ointment Apply 1 application topically 2 (two) times daily. 30 g 1  . losartan (COZAAR) 25 MG tablet Take 1 tablet (25 mg total) by mouth daily. 30 tablet 11   No current facility-administered medications for this visit.    Facility-Administered Medications Ordered in Other Visits  Medication Dose Route Frequency Provider Last Rate Last Dose  . acetaminophen (TYLENOL) tablet 650 mg  650 mg Oral Once [name], MD      . sodium chloride 0.9 % injection 10 mL  10 mL Intracatheter PRN [name], MD   10 mL at 05/01/16 1622  . sodium chloride 0.9 % injection 10 mL  10 mL Intravenous PRN [name], MD        PHYSICAL EXAMINATION: ECOG PERFORMANCE STATUS: 1 - Symptomatic but completely ambulatory  Vitals:   07/09/18 1404  BP: 139/82  Pulse: 72  Resp: 18  Temp: 98.4 F (36.9 C)  SpO2: 100%   Filed Weights   07/09/18 1404  Weight: 234 lb 6.4 oz (106.3 kg)    GENERAL:alert, no distress and comfortable SKIN: skin color, texture, turgor are normal, no rashes or significant lesions EYES: normal, Conjunctiva are pink and non-injected, sclera clear OROPHARYNX:no exudate, no erythema and lips, buccal mucosa, and tongue normal  NECK: supple, thyroid normal size, non-tender, without nodularity LYMPH:  no palpable lymphadenopathy in the cervical, axillary or inguinal LUNGS: clear to auscultation and percussion with normal breathing effort HEART: regular rate & rhythm and no murmurs and no lower extremity  edema ABDOMEN:abdomen soft, non-tender and normal bowel sounds Musculoskeletal:no cyanosis of digits and no clubbing  NEURO: alert & oriented x 3 with fluent speech, no focal motor/sensory deficits BREAST: (+) Still has warm skin and tenderness of right breast inferior to nipple. No skin erythema or palpable mass  LABORATORY DATA:  I have reviewed the data as listed CBC Latest Ref Rng & Units 07/09/2018 05/26/2018 04/23/2018  WBC 4.0 - 10.5 K/uL 6.4 8.3 5.8  Hemoglobin 12.0 - 15.0 g/dL 11.4(L) 12.6 11.9  Hematocrit 36.0 - 46.0 % 34.2(L) 37.5 35.9  Platelets 150 - 400 K/uL 260 221 215     CMP Latest Ref Rng & Units 07/09/2018 05/26/2018 04/23/2018  Glucose 70 - 99 mg/dL 101(H) 109(H) 102(H)  BUN 6 - 20 mg/dL _0 Creatinine 0.44 - 1.00 mg/dL 0.79 0.84 1.04(H)  Sodium 135 - 145 mmol/L 141 140 140  Potassium 3.5 - 5.1 mmol/L 3.8 3.7 4.0  Chloride 98 - 111 mmol/L 105 103 105  CO2 22 - 32 mmol/L _1 Calcium 8.9 - 10.3 mg/dL 9.0 9.7 9.2  Total Protein 6.5 - 8.1 g/dL 7.6 8.1 7.7  Total Bilirubin 0.3 - 1.2 mg/dL 0.2(L) 0.5 0.5  Alkaline Phos 38 - 126 U/L 72 80 73  AST 15 - 41 U/L _2 ALT 0 - 44 U/L 20 33 28      RADIOGRAPHIC STUDIES: I have personally reviewed the radiological images as listed and agreed with the findings in the report. No results found.   ASSESSMENT & PLAN:  [name] [name] is a 47 y.o. female with    1. Cellulitis of right breast -Her breast exam last week was most consistent with cellulitis.  She also had a fever and chills 2 days ago.   -I prescribed her Augmentin. Her skin erythema has improved on today's exam, but still slightly warm and tender. I strongly encouraged her to continue her Augmentin dose and complete a 10 day course. If still not much improved or resolved I may consider breast ultrasound to rule out abscess.   2.  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 tolerating well.  -her restaging scan has showed NED  -Labs reviewed and adequate to proceed with Herceptin treatment today  -Next CT scan in 3 weeks and Next mammogram in 07/2018.   -f/u in 3 weeks to review scan  -She is due for mammogram next month  3. HTN -On losartan giving by Dr. BHaroldine Lawsin June 2019 -BP at normal today at 139/82 (07/09/18)  -Due for ECHO in 2 weeks    Plan -Labs adequate to proceed with Herceptin today  -Continue Augmentin  -[place] [facility] [place] -Lab, flush, Herceptin and F/u in 3 weeks  -CT CAP with contrast before next OV in 3 weeks    No problem-specific Assessment & Plan notes found for this encounter.   No orders of the defined types were placed in this encounter.  All questions were answered. The patient knows to call the clinic with any problems, questions or concerns. No barriers to learning was detected. I spent 20 minutes counseling the patient face to face. The total time spent in the appointment was 20 minutes and more than 50% was on counseling and review of test results     YTruitt Merle MD 07/09/2018   I, AJoslyn Devon am acting as scribe for YTruitt Merle MD.   I have reviewed the above documentation for accuracy and completeness, and I agree with the above.

## 2018-07-09 NOTE — Patient Instructions (Signed)
[facility] Discharge Instructions for Patients Receiving Chemotherapy  Today you received the following chemotherapy agents: Trastuzumab (Herceptin)  To help prevent nausea and vomiting after your treatment, we encourage you to take your nausea medication as directed.    If you develop nausea and vomiting that is not controlled by your nausea medication, call the clinic.   BELOW ARE SYMPTOMS THAT SHOULD BE REPORTED IMMEDIATELY:  *FEVER GREATER THAN 100.5 F  *CHILLS WITH OR WITHOUT FEVER  NAUSEA AND VOMITING THAT IS NOT CONTROLLED WITH YOUR NAUSEA MEDICATION  *UNUSUAL SHORTNESS OF BREATH  *UNUSUAL BRUISING OR BLEEDING  TENDERNESS IN MOUTH AND THROAT WITH OR WITHOUT PRESENCE OF ULCERS  *URINARY PROBLEMS  *BOWEL PROBLEMS  UNUSUAL RASH Items with * indicate a potential emergency and should be followed up as soon as possible.  Feel free to call the clinic should you have any questions or concerns. The clinic phone number is [phone].  Please show the CHEMO ALERT CARD at check-in to the Emergency Department and triage nurse.    

## 2018-07-11 ENCOUNTER — Telehealth: Payer: Self-pay | Admitting: Hematology

## 2018-07-11 ENCOUNTER — Encounter: Payer: Self-pay | Admitting: Hematology

## 2018-07-11 NOTE — Telephone Encounter (Signed)
Spoke with patient and verified the upcoming appointments for Jan 2020.

## 2018-07-15 DIAGNOSIS — E559 Vitamin D deficiency, unspecified: Secondary | ICD-10-CM | POA: Insufficient documentation

## 2018-07-21 ENCOUNTER — Encounter: Payer: Self-pay | Admitting: Hematology

## 2018-07-22 ENCOUNTER — Ambulatory Visit (HOSPITAL_BASED_OUTPATIENT_CLINIC_OR_DEPARTMENT_OTHER)
Admission: RE | Admit: 2018-07-22 | Discharge: 2018-07-22 | Disposition: A | Discharge status: Home / Self Care | Payer: 59 | Source: Ambulatory Visit | Attending: Internal Medicine | Admitting: Internal Medicine

## 2018-07-22 ENCOUNTER — Encounter (HOSPITAL_COMMUNITY): Payer: Self-pay | Admitting: Internal Medicine

## 2018-07-22 ENCOUNTER — Ambulatory Visit (HOSPITAL_COMMUNITY)
Admission: RE | Admit: 2018-07-22 | Discharge: 2018-07-22 | Disposition: A | Discharge status: Home / Self Care | Payer: 59 | Source: Ambulatory Visit | Attending: Hematology | Admitting: Hematology

## 2018-07-22 VITALS — BP 170/86 | HR 93 | Wt 237.0 lb

## 2018-07-22 DIAGNOSIS — M4185 Other forms of scoliosis, thoracolumbar region: Secondary | ICD-10-CM | POA: Diagnosis not present

## 2018-07-22 DIAGNOSIS — Z923 Personal history of irradiation: Secondary | ICD-10-CM | POA: Diagnosis not present

## 2018-07-22 DIAGNOSIS — Z171 Estrogen receptor negative status [ER-]: Secondary | ICD-10-CM | POA: Insufficient documentation

## 2018-07-22 DIAGNOSIS — C50911 Malignant neoplasm of unspecified site of right female breast: Secondary | ICD-10-CM | POA: Insufficient documentation

## 2018-07-22 DIAGNOSIS — K219 Gastro-esophageal reflux disease without esophagitis: Secondary | ICD-10-CM | POA: Insufficient documentation

## 2018-07-22 DIAGNOSIS — C50919 Malignant neoplasm of unspecified site of unspecified female breast: Secondary | ICD-10-CM

## 2018-07-22 DIAGNOSIS — I10 Essential (primary) hypertension: Secondary | ICD-10-CM | POA: Insufficient documentation

## 2018-07-22 DIAGNOSIS — Z9221 Personal history of antineoplastic chemotherapy: Secondary | ICD-10-CM | POA: Insufficient documentation

## 2018-07-22 DIAGNOSIS — I700 Atherosclerosis of aorta: Secondary | ICD-10-CM | POA: Diagnosis not present

## 2018-07-22 DIAGNOSIS — Z791 Long term (current) use of non-steroidal anti-inflammatories (NSAID): Secondary | ICD-10-CM | POA: Insufficient documentation

## 2018-07-22 DIAGNOSIS — C7801 Secondary malignant neoplasm of right lung: Secondary | ICD-10-CM | POA: Diagnosis not present

## 2018-07-22 DIAGNOSIS — Z79899 Other long term (current) drug therapy: Secondary | ICD-10-CM | POA: Diagnosis not present

## 2018-07-22 DIAGNOSIS — C7802 Secondary malignant neoplasm of left lung: Secondary | ICD-10-CM | POA: Insufficient documentation

## 2018-07-22 DIAGNOSIS — Z87442 Personal history of urinary calculi: Secondary | ICD-10-CM | POA: Insufficient documentation

## 2018-07-22 NOTE — Progress Notes (Signed)
  Echocardiogram 2D Echocardiogram has been performed.  Jaimy Kliethermes L Androw 07/22/2018, 10:56 AM

## 2018-07-22 NOTE — Progress Notes (Signed)
Patient ID: [name] [name], female   DOB: 1970-12-26, 47 y.o.   MRN: [id number]   [facility] NOTE  Patient Care Team: System, Pcp Not In as PCP - [place], [place], DO (Family Medicine) Holley Bouche, NP (Inactive) as Nurse Practitioner (Nurse Practitioner) Stark Klein, MD as Consulting Physician (General Surgery) Arloa Koh, MD as Consulting Physician (Radiation Oncology) [name], MD as Consulting Physician (Hematology)  HPI:   [name] is a 47 y/o woman with Stage IV breast CA referred by Dr. Burr Medico for enrollment into the cardio-oncology clinic for surveillance while receiving Herceptin (started 04/12/15 and on indefinitiely)  Oncology History   Breast cancer 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   Staging form: Breast, AJCC 7th Edition     Clinical stage from 07/22/2014: Stage IV (T3, N1, M1) - Unsigned       Breast cancer 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 ([place])   07/02/2014 Mammogram    Mammogram showed a 2cm right beast mass and a 1.8cm right axillary node. MRI breast on 07/16/2014 showed 7cm R breast lesion and 4.4cm r axillary node     07/02/2014 Imaging    CT CAP: a 4.7cm mass in LUL lung and a 2.1cm mas in RML, and a small nodule in RUL, suspecious for metastasis      07/09/2014 Initial Diagnosis    right IDA with b/l lung lesions, ER-/PR-/HER2+    07/09/2014 Initial Biopsy    US guided right breast mass and axillary node biopsy showed IDA, and DCIS, ER-/PR-/HER2+    07/26/2014 Pathologic Stage    Left lung mass by IR, path revealed high grade carcinoma, morphology similar to breast tumor biopsy, TTF(-), NapsinA(-), ER(-)    08/04/2014 - 03/22/2015 Chemotherapy    weekly Paclitaxel 22m/m2, trastuzumab and pertuzumab every 3 weeks    08/30/2014 Genetic Testing    BreastNext panel was negative. 17 genes including BRCA1, BRCA2, were negative for mutations.     10/04/2014 Imaging    Interval decrease in the right axillary lymphadenopathy. Bilateral pulmonary  lesions with left hilar lymphadenopathy also markedly decreased in the interval. The left hilar lymphadenopathy has resolved.    12/20/2014 Imaging    restaging CT showed stable disease, no new lesions     03/29/2015 Pathology Results     right breast lumpectomy showed  chemotherapy treatment effect,  a 1 mm residual tumor,   margins were widely negative, 5 sentinel lymph nodes and 2 axillary lymph nodes were negative.     03/29/2015 Surgery     right breast lumpectomy and sentinel lymph node biopsy  by Dr. BBarry Dienes   04/12/2015 -  Chemotherapy     Herceptin maintenance therapy , 6 mg/kg, every 3 weeks    05/03/2015 - 06/14/2015 Radiation Therapy    right breast adjuvant irradiation by Dr. MValere Dross    08/28/2016 Imaging    CT CAP w Contrast  IMPRESSION: 1. No acute process or evidence of metastatic disease within the chest, abdomen, or pelvis. 2. Similar to less well-defined left upper lobe density, likely scarring. No evidence of new or progressive pulmonary metastasis. 3. Right nephrolithiasis.    01/22/2017 Imaging    CT Chest W Contrast 01/22/17 IMPRESSION: 1. Stable exam. No new or progressive findings. No evidence for metastatic disease. 2. Left upper lobe architectural distortion/scarring is stable. 3. Nonobstructing right renal stone.    07/31/2017 Imaging    Bone Scan Whole Body 07/31/17  IMPRESSION: 1. No scintigraphic evidence of osseous metastatic disease. 2. Thoracolumbar scoliosis.  07/31/2017 Imaging    CT CAP W Contrast 07/31/17 IMPRESSION: 1. No findings of active or recurrent malignancy. 2. Other imaging findings of potential clinical significance: Aortic Atherosclerosis (ICD10-I70.0). Mild cardiomegaly. Postoperative and radiation therapy findings in the right chest. Nonobstructive right nephrolithiasis. Thoracolumbar scoliosis.     01/20/2018 Echocardiogram    ECHO 01/20/2018 Study Conclusions  - Left ventricle: The cavity size was normal. Wall  thickness was   normal. Systolic function was normal. The estimated ejection   fraction was in the range of 55% to 60%. Wall motion was normal;   there were no regional wall motion abnormalities. Doppler   parameters are consistent with abnormal left ventricular   relaxation (grade 1 diastolic dysfunction). - Impressions: GLS -20.7% LS&' 10.4 cm.     02/17/2018 Imaging    02/17/2018 CT CAP IMPRESSION: 1. No evidence for residual or recurrent tumor or metastatic disease. 2.  Aortic Atherosclerosis (ICD10-I70.0). 3. Right renal calculi. 4. Scoliosis.     Continues to receive herceptin for metastatic breast CA (has been receiving Herceptin since 8/16. Last CT scan showed resolution of mets. 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 Feels good. No SOB or CP. No edema. BP remains high despite addition of losartan at next visit. SBP remains in 135-155 range.   Echo today 60-65% GLS -22.2% Personally reviewed  Echo 01/20/18. EF 55-60% LS' 10.4 cm/s GLS -20.7% Personally reviewed  Echo 06/20/17 EF 55-60% LS' not measured will  GLS -19.1  Echo 3/16 55-60% Lat s' 12.0 cm/sec GLS -17.5% Echo 2/17 55-60%  Lat s' 11.2 cm/sec GLS -17.2% Echo 01/18/16 EF 60-65% lat s' 7.9 cm/sec (out of plane) GLS -20.2% Echo 05/22/16 EF 60-65%  Lat s'11.2cm.sec GLS underestimated due to poor endocardial tracking Echo 5/18 EF 60% LS' 11.9 cm/s GLS -19.7  MEDICAL HISTORY:  Past Medical History:  Diagnosis Date  . Breast cancer ([place])   . Breast cancer ([place])   . GERD (gastroesophageal reflux disease)    during pregnancy   . Pneumonia    hx of pneumonia 08/2013   . S/P radiation therapy 05/03/2015 through 06/14/2015    Right breast 4680 cGy in 26 sessions, right breast boost 1000 cGy in 5 sessions. Right supraclavicular/axillary region 4680 cGy with a supplemental PA field to bring the axillary dose up to 4500 cGy in 26 sessions    SURGICAL HISTORY: Past Surgical  History:  Procedure Laterality Date  . BREAST LUMPECTOMY WITH RADIOACTIVE SEED AND SENTINEL LYMPH NODE BIOPSY Right 03/29/2015   Procedure: BREAST LUMPECTOMY WITH RADIOACTIVE SEED AND SENTINEL LYMPH NODE BIOPSY;  Surgeon: Stark Klein, MD;  Location: [facility];  Service: General;  Laterality: Right;  . CESAREAN SECTION    . CHOLECYSTECTOMY    . ESSURE TUBAL LIGATION    . PORTACATH PLACEMENT Left 07/21/2014   Procedure: INSERTION PORT-A-CATH;  Surgeon: Stark Klein, MD;  Location: [facility];  Service: General;  Laterality: Left;  . WISDOM TOOTH EXTRACTION      SOCIAL HISTORY: History   Social History  . Marital Status: Married    Spouse Name: N/A    Number of Children:  she has 2 daughters at the age of 68 and 72.   . Years of Education: N/A   Occupational History  .  works as Freight forwarder for a [employer].    Social History Main Topics  . Smoking status: Never Smoker   . Smokeless tobacco: Not on file  . Alcohol Use: Yes  .  Drug Use: No  . Sexual Activity: No    FAMILY HISTORY: Family History  Problem Relation Age of Onset  . Breast cancer Mother 53  . Liver cancer Father 87  . Breast cancer Maternal Aunt 36  . Prostate cancer Maternal Grandfather   . Liver cancer Paternal Grandmother   . Prostate cancer Paternal Grandfather      GENETICS: 08/30/2014 BreastNext panel was negative. 17 genes including BRCA1, BRCA2, were negative for mutations.   ALLERGIES:  is allergic to adhesive [tape]; aspirin; and codeine.  MEDICATIONS:  Current Outpatient Medications  Medication Sig Dispense Refill  . Calcium-Phosphorus-Vitamin D (CALCIUM GUMMIES PO) Take by mouth daily.    . Camphor-Eucalyptus-Menthol (VICKS VAPORUB EX) Apply 1 application topically at bedtime. Applies under nose, throat and on chest.    . cetirizine (ZYRTEC) 10 MG tablet Take 10 mg by mouth daily as needed for allergies (allergies).     . hydrocortisone 2.5 % cream APPLY TOPICALLY TO THE AFFECTED  AREA TWICE DAILY 454 g 0  . ibuprofen (ADVIL,MOTRIN) 800 MG tablet Take 1 tablet (800 mg total) by mouth every 8 (eight) hours as needed. 60 tablet 1  . lidocaine-prilocaine (EMLA) cream APPLY TOPICALLY TO THE AFFECTED AREA AS NEEDED 30 g 2  . loperamide (IMODIUM A-D) 2 MG tablet Take 1 tablet (2 mg total) by mouth 4 (four) times daily as needed for diarrhea or loose stools. 30 tablet 0  . losartan (COZAAR) 25 MG tablet Take 1 tablet (25 mg total) by mouth daily. 30 tablet 11  . meloxicam (MOBIC) 7.5 MG tablet Take by mouth.    . Multiple Vitamins-Minerals (AIRBORNE PO) Take 1 tablet by mouth daily.    . Multiple Vitamins-Minerals (MULTIVITAMIN GUMMIES ADULT PO) Take 1 each by mouth daily. Women's Vitafusion gummie    . metaxalone (SKELAXIN) 800 MG tablet Take 1 tablet (800 mg total) by mouth 3 (three) times daily as needed for muscle spasms. (Patient not taking: Reported on 07/22/2018) 60 tablet 1  . triamcinolone (KENALOG) 0.025 % ointment Apply 1 application topically 2 (two) times daily. (Patient not taking: Reported on 07/22/2018) 30 g 1   No current facility-administered medications for this encounter.    Facility-Administered Medications Ordered in Other Encounters  Medication Dose Route Frequency Provider Last Rate Last Dose  . acetaminophen (TYLENOL) tablet 650 mg  650 mg Oral Once [name], MD      . sodium chloride 0.9 % injection 10 mL  10 mL Intracatheter PRN [name], MD   10 mL at 05/01/16 1622  . sodium chloride 0.9 % injection 10 mL  10 mL Intravenous PRN [name], MD       Vitals:   07/22/18 1057 07/22/18 1101  BP: (!) 174/90 (!) 170/86  Pulse: 93   SpO2: 98%      PHYSICAL EXAMINATION: General:  Well appearing. No resp difficulty HEENT: normal Neck: supple. no JVD. Carotids 2+ bilat; no bruits. No lymphadenopathy or thryomegaly appreciated. Cor: PMI nondisplaced. Regular rate & rhythm. No rubs, gallops or murmurs. Left port-a-cath Lungs: clear Abdomen: soft,  nontender, nondistended. No hepatosplenomegaly. No bruits or masses. Good bowel sounds. Extremities: no cyanosis, clubbing, rash, edema Neuro: alert & orientedx3, cranial nerves grossly intact. moves all 4 extremities w/o difficulty. Affect pleasant    LABORATORY DATA:  I have reviewed the data as listed CBC Latest Ref Rng & Units 07/09/2018 05/26/2018 04/23/2018  WBC 4.0 - 10.5 K/uL 6.4 8.3 5.8  Hemoglobin 12.0 - 15.0 g/dL  11.4(L) 12.6 11.9  Hematocrit 36.0 - 46.0 % 34.2(L) 37.5 35.9  Platelets 150 - 400 K/uL 260 221 215    CMP Latest Ref Rng & Units 07/09/2018 05/26/2018 04/23/2018  Glucose 70 - 99 mg/dL 101(H) 109(H) 102(H)  BUN 6 - 20 mg/dL 11 13 11   Creatinine 0.44 - 1.00 mg/dL 0.79 0.84 1.04(H)  Sodium 135 - 145 mmol/L 141 140 140  Potassium 3.5 - 5.1 mmol/L 3.8 3.7 4.0  Chloride 98 - 111 mmol/L 105 103 105  CO2 22 - 32 mmol/L 27 26 26   Calcium 8.9 - 10.3 mg/dL 9.0 9.7 9.2  Total Protein 6.5 - 8.1 g/dL 7.6 8.1 7.7  Total Bilirubin 0.3 - 1.2 mg/dL 0.2(L) 0.5 0.5  Alkaline Phos 38 - 126 U/L 72 80 73  AST 15 - 41 U/L 17 24 29   ALT 0 - 44 U/L 20 33 28     ASSESSMENT & PLAN:   1. R breast invasive ductal carcinoma, T3N1M1, stage IV, ER-/PR-/HER2+, with metastases to b/l lungs, biopsy confirmed - I reviewed echos personally. EF and Doppler parameters stable. No HF on exam. Continue Herceptin.  - repeat echos in 6 months  2. HTN - remains elevated. Increase losartan to 50 daily. May need spiro.   Glori Bickers MD 07/22/2018

## 2018-07-29 ENCOUNTER — Other Ambulatory Visit: Payer: Self-pay | Admitting: Hematology

## 2018-07-29 DIAGNOSIS — C50911 Malignant neoplasm of unspecified site of right female breast: Secondary | ICD-10-CM

## 2018-07-29 DIAGNOSIS — C7800 Secondary malignant neoplasm of unspecified lung: Principal | ICD-10-CM

## 2018-07-30 ENCOUNTER — Inpatient Hospital Stay: Payer: 59

## 2018-07-30 ENCOUNTER — Inpatient Hospital Stay: Payer: 59 | Admitting: Hematology

## 2018-07-30 ENCOUNTER — Other Ambulatory Visit: Payer: Self-pay | Admitting: Hematology

## 2018-07-30 ENCOUNTER — Inpatient Hospital Stay: Payer: 59 | Attending: Hematology

## 2018-07-30 VITALS — BP 139/86 | HR 70 | Temp 98.9°F | Resp 20

## 2018-07-30 DIAGNOSIS — C50911 Malignant neoplasm of unspecified site of right female breast: Secondary | ICD-10-CM | POA: Insufficient documentation

## 2018-07-30 DIAGNOSIS — Z23 Encounter for immunization: Secondary | ICD-10-CM | POA: Diagnosis not present

## 2018-07-30 DIAGNOSIS — Z5112 Encounter for antineoplastic immunotherapy: Secondary | ICD-10-CM | POA: Insufficient documentation

## 2018-07-30 DIAGNOSIS — C7800 Secondary malignant neoplasm of unspecified lung: Secondary | ICD-10-CM

## 2018-07-30 MED ORDER — SODIUM CHLORIDE 0.9 % IJ SOLN
10.0000 mL | INTRAMUSCULAR | Status: DC | PRN
  Administered 2018-07-30: 10 mL
  Filled 2018-07-30: qty 10

## 2018-07-30 MED ORDER — INFLUENZA VAC SPLIT QUAD 0.5 ML IM SUSY
0.5000 mL | PREFILLED_SYRINGE | Freq: Once | INTRAMUSCULAR | Status: AC
  Administered 2018-07-30: 0.5 mL via INTRAMUSCULAR

## 2018-07-30 MED ORDER — SODIUM CHLORIDE 0.9 % IV SOLN
Freq: Once | INTRAVENOUS | Status: AC
  Administered 2018-07-30: 10:00:00 via INTRAVENOUS
  Filled 2018-07-30: qty 250

## 2018-07-30 MED ORDER — ACETAMINOPHEN 325 MG PO TABS
ORAL_TABLET | ORAL | Status: AC
  Filled 2018-07-30: qty 2

## 2018-07-30 MED ORDER — INFLUENZA VAC SPLIT QUAD 0.5 ML IM SUSY
PREFILLED_SYRINGE | INTRAMUSCULAR | Status: AC
  Filled 2018-07-30: qty 0.5

## 2018-07-30 MED ORDER — ACETAMINOPHEN 325 MG PO TABS
650.0000 mg | ORAL_TABLET | Freq: Once | ORAL | Status: AC
  Administered 2018-07-30: 650 mg via ORAL

## 2018-07-30 MED ORDER — HEPARIN SOD (PORK) LOCK FLUSH 100 UNIT/ML IV SOLN
500.0000 [IU] | Freq: Once | INTRAVENOUS | Status: AC | PRN
  Administered 2018-07-30: 500 [IU]
  Filled 2018-07-30: qty 5

## 2018-07-30 MED ORDER — TRASTUZUMAB CHEMO 150 MG IV SOLR
600.0000 mg | Freq: Once | INTRAVENOUS | Status: AC
  Administered 2018-07-30: 600 mg via INTRAVENOUS
  Filled 2018-07-30: qty 28.57

## 2018-07-30 NOTE — Patient Instructions (Signed)
[facility] Discharge Instructions for Patients Receiving Chemotherapy  Today you received the following chemotherapy agents Trastuzumab (HERCEPTIN).  To help prevent nausea and vomiting after your treatment, we encourage you to take your nausea medication as prescribed.   If you develop nausea and vomiting that is not controlled by your nausea medication, call the clinic.   BELOW ARE SYMPTOMS THAT SHOULD BE REPORTED IMMEDIATELY:  *FEVER GREATER THAN 100.5 F  *CHILLS WITH OR WITHOUT FEVER  NAUSEA AND VOMITING THAT IS NOT CONTROLLED WITH YOUR NAUSEA MEDICATION  *UNUSUAL SHORTNESS OF BREATH  *UNUSUAL BRUISING OR BLEEDING  TENDERNESS IN MOUTH AND THROAT WITH OR WITHOUT PRESENCE OF ULCERS  *URINARY PROBLEMS  *BOWEL PROBLEMS  UNUSUAL RASH Items with * indicate a potential emergency and should be followed up as soon as possible.  Feel free to call the clinic should you have any questions or concerns. The clinic phone number is [phone].  Please show the [facility] at check-in to the Emergency Department and triage nurse.

## 2018-08-11 ENCOUNTER — Ambulatory Visit (HOSPITAL_COMMUNITY)
Admission: RE | Admit: 2018-08-11 | Discharge: 2018-08-11 | Disposition: A | Discharge status: Home / Self Care | Payer: 59 | Source: Ambulatory Visit | Attending: Hematology | Admitting: Hematology

## 2018-08-11 DIAGNOSIS — C7800 Secondary malignant neoplasm of unspecified lung: Secondary | ICD-10-CM | POA: Insufficient documentation

## 2018-08-11 DIAGNOSIS — C50911 Malignant neoplasm of unspecified site of right female breast: Secondary | ICD-10-CM | POA: Diagnosis present

## 2018-08-11 MED ORDER — IOHEXOL 300 MG/ML  SOLN
100.0000 mL | Freq: Once | INTRAMUSCULAR | Status: AC | PRN
  Administered 2018-08-11: 100 mL via INTRAVENOUS

## 2018-08-11 MED ORDER — SODIUM CHLORIDE (PF) 0.9 % IJ SOLN
INTRAMUSCULAR | Status: AC
  Filled 2018-08-11: qty 50

## 2018-08-11 MED ORDER — HEPARIN SOD (PORK) LOCK FLUSH 100 UNIT/ML IV SOLN
INTRAVENOUS | Status: AC
  Filled 2018-08-11: qty 5

## 2018-08-11 MED ORDER — IOHEXOL 300 MG/ML  SOLN
30.0000 mL | Freq: Once | INTRAMUSCULAR | Status: AC | PRN
  Administered 2018-08-11: 30 mL via ORAL

## 2018-08-11 MED ORDER — HEPARIN SOD (PORK) LOCK FLUSH 100 UNIT/ML IV SOLN
500.0000 [IU] | Freq: Once | INTRAVENOUS | Status: AC
  Administered 2018-08-11: 500 [IU] via INTRAVENOUS

## 2018-08-14 ENCOUNTER — Other Ambulatory Visit (HOSPITAL_COMMUNITY): Payer: Self-pay

## 2018-08-14 MED ORDER — LOSARTAN POTASSIUM 50 MG PO TABS: 50.0000 mg | ORAL_TABLET | Freq: Every day | ORAL | 3 refills | Status: DC

## 2018-08-15 ENCOUNTER — Ambulatory Visit (HOSPITAL_COMMUNITY): Payer: 59

## 2018-08-21 ENCOUNTER — Inpatient Hospital Stay: Payer: BLUE CROSS/BLUE SHIELD | Attending: Hematology

## 2018-08-21 ENCOUNTER — Inpatient Hospital Stay: Payer: BLUE CROSS/BLUE SHIELD

## 2018-08-21 ENCOUNTER — Other Ambulatory Visit: Payer: Self-pay | Admitting: Hematology

## 2018-08-21 VITALS — BP 129/60 | HR 70 | Temp 99.0°F | Resp 18

## 2018-08-21 DIAGNOSIS — C50911 Malignant neoplasm of unspecified site of right female breast: Secondary | ICD-10-CM

## 2018-08-21 DIAGNOSIS — Z171 Estrogen receptor negative status [ER-]: Secondary | ICD-10-CM | POA: Insufficient documentation

## 2018-08-21 DIAGNOSIS — I10 Essential (primary) hypertension: Secondary | ICD-10-CM | POA: Insufficient documentation

## 2018-08-21 DIAGNOSIS — C7800 Secondary malignant neoplasm of unspecified lung: Secondary | ICD-10-CM

## 2018-08-21 DIAGNOSIS — Z5112 Encounter for antineoplastic immunotherapy: Secondary | ICD-10-CM | POA: Insufficient documentation

## 2018-08-21 DIAGNOSIS — C7802 Secondary malignant neoplasm of left lung: Secondary | ICD-10-CM | POA: Insufficient documentation

## 2018-08-21 DIAGNOSIS — G620 Drug-induced polyneuropathy: Secondary | ICD-10-CM | POA: Diagnosis not present

## 2018-08-21 DIAGNOSIS — Z95828 Presence of other vascular implants and grafts: Secondary | ICD-10-CM | POA: Insufficient documentation

## 2018-08-21 LAB — COMPREHENSIVE METABOLIC PANEL
ALT: 20 U/L (ref 0–44)
AST: 17 U/L (ref 15–41)
Albumin: 3.6 g/dL (ref 3.5–5.0)
Alkaline Phosphatase: 74 U/L (ref 38–126)
Anion gap: 9 (ref 5–15)
BUN: 7 mg/dL (ref 6–20)
CALCIUM: 9.1 mg/dL (ref 8.9–10.3)
CO2: 25 mmol/L (ref 22–32)
CREATININE: 0.96 mg/dL (ref 0.44–1.00)
Chloride: 104 mmol/L (ref 98–111)
GFR calc Af Amer: >60 mL/min (ref >60)
Glucose, Bld: 97 mg/dL (ref 70–99)
Potassium: 4.3 mmol/L (ref 3.5–5.1)
Sodium: 138 mmol/L (ref 135–145)
TOTAL PROTEIN: 7.8 g/dL (ref 6.5–8.1)
Total Bilirubin: 0.9 mg/dL (ref 0.3–1.2)

## 2018-08-21 LAB — CBC WITH DIFFERENTIAL/PLATELET
Abs Immature Granulocytes: 0.02 10*3/uL (ref 0.00–0.07)
BASOS PCT: 0 %
Basophils Absolute: 0 10*3/uL (ref 0.0–0.1)
EOS ABS: 0.1 10*3/uL (ref 0.0–0.5)
EOS PCT: 1 %
HCT: 37.1 % (ref 36.0–46.0)
Hemoglobin: 12.1 g/dL (ref 12.0–15.0)
Immature Granulocytes: 0 %
Lymphocytes Relative: 18 %
Lymphs Abs: 1.6 10*3/uL (ref 0.7–4.0)
MCH: 29.6 pg (ref 26.0–34.0)
MCHC: 32.6 g/dL (ref 30.0–36.0)
MCV: 90.7 fL (ref 80.0–100.0)
Monocytes Absolute: 0.6 10*3/uL (ref 0.1–1.0)
Monocytes Relative: 7 %
Neutro Abs: 6.3 10*3/uL (ref 1.7–7.7)
Neutrophils Relative %: 74 %
Platelets: 224 10*3/uL (ref 150–400)
RBC: 4.09 MIL/uL (ref 3.87–5.11)
RDW: 12.9 % (ref 11.5–15.5)
WBC: 8.7 10*3/uL (ref 4.0–10.5)
nRBC: 0 % (ref 0.0–0.2)

## 2018-08-21 MED ORDER — SODIUM CHLORIDE 0.9 % IV SOLN
Freq: Once | INTRAVENOUS | Status: AC
  Administered 2018-08-21: 09:00:00 via INTRAVENOUS
  Filled 2018-08-21: qty 250

## 2018-08-21 MED ORDER — TRASTUZUMAB CHEMO 150 MG IV SOLR
600.0000 mg | Freq: Once | INTRAVENOUS | Status: AC
  Administered 2018-08-21: 600 mg via INTRAVENOUS
  Filled 2018-08-21: qty 28.57

## 2018-08-21 MED ORDER — HEPARIN SOD (PORK) LOCK FLUSH 100 UNIT/ML IV SOLN
500.0000 [IU] | Freq: Once | INTRAVENOUS | Status: AC | PRN
  Administered 2018-08-21: 500 [IU]
  Filled 2018-08-21: qty 5

## 2018-08-21 MED ORDER — SODIUM CHLORIDE 0.9% FLUSH
10.0000 mL | Freq: Once | INTRAVENOUS | Status: AC
  Administered 2018-08-21: 10 mL
  Filled 2018-08-21: qty 10

## 2018-08-21 MED ORDER — HEPARIN SOD (PORK) LOCK FLUSH 100 UNIT/ML IV SOLN
500.0000 [IU] | Freq: Once | INTRAVENOUS | Status: DC | PRN
  Filled 2018-08-21: qty 5

## 2018-08-21 MED ORDER — ACETAMINOPHEN 325 MG PO TABS: 650.0000 mg | ORAL_TABLET | Freq: Once | ORAL | Status: DC

## 2018-08-21 MED ORDER — SODIUM CHLORIDE 0.9 % IJ SOLN
10.0000 mL | INTRAMUSCULAR | Status: DC | PRN
  Administered 2018-08-21: 10 mL
  Filled 2018-08-21: qty 10

## 2018-08-21 NOTE — Patient Instructions (Signed)
[facility] Discharge Instructions for Patients Receiving Chemotherapy Today you received the following chemotherapy agents:  Herceptin To help prevent nausea and vomiting after your treatment, we encourage you to take your nausea medication as prescribed.   If you develop nausea and vomiting that is not controlled by your nausea medication, call the clinic.   BELOW ARE SYMPTOMS THAT SHOULD BE REPORTED IMMEDIATELY:  *FEVER GREATER THAN 100.5 F  *CHILLS WITH OR WITHOUT FEVER  NAUSEA AND VOMITING THAT IS NOT CONTROLLED WITH YOUR NAUSEA MEDICATION  *UNUSUAL SHORTNESS OF BREATH  *UNUSUAL BRUISING OR BLEEDING  TENDERNESS IN MOUTH AND THROAT WITH OR WITHOUT PRESENCE OF ULCERS  *URINARY PROBLEMS  *BOWEL PROBLEMS  UNUSUAL RASH Items with * indicate a potential emergency and should be followed up as soon as possible.  Feel free to call the clinic should you have any questions or concerns. The clinic phone number is [phone].  Please show the CHEMO ALERT CARD at check-in to the Emergency Department and triage nurse.   

## 2018-08-22 ENCOUNTER — Telehealth: Payer: Self-pay | Admitting: Hematology

## 2018-08-22 LAB — CANCER ANTIGEN 27.29: CA 27.29: 9.3 U/mL (ref 0.0–38.6)

## 2018-08-22 NOTE — Telephone Encounter (Signed)
Scheduled appt per 1/9 sch message - pt is aware of appt date and time

## 2018-09-10 ENCOUNTER — Inpatient Hospital Stay: Payer: BLUE CROSS/BLUE SHIELD

## 2018-09-10 ENCOUNTER — Encounter: Payer: Self-pay | Admitting: Hematology

## 2018-09-10 ENCOUNTER — Ambulatory Visit (HOSPITAL_BASED_OUTPATIENT_CLINIC_OR_DEPARTMENT_OTHER): Payer: BLUE CROSS/BLUE SHIELD | Admitting: Hematology

## 2018-09-10 VITALS — BP 138/80 | HR 71 | Temp 98.9°F | Resp 16

## 2018-09-10 DIAGNOSIS — Z171 Estrogen receptor negative status [ER-]: Secondary | ICD-10-CM

## 2018-09-10 DIAGNOSIS — G620 Drug-induced polyneuropathy: Secondary | ICD-10-CM

## 2018-09-10 DIAGNOSIS — I10 Essential (primary) hypertension: Secondary | ICD-10-CM

## 2018-09-10 DIAGNOSIS — C7802 Secondary malignant neoplasm of left lung: Secondary | ICD-10-CM

## 2018-09-10 DIAGNOSIS — C50911 Malignant neoplasm of unspecified site of right female breast: Secondary | ICD-10-CM

## 2018-09-10 DIAGNOSIS — C7800 Secondary malignant neoplasm of unspecified lung: Principal | ICD-10-CM

## 2018-09-10 DIAGNOSIS — Z5112 Encounter for antineoplastic immunotherapy: Secondary | ICD-10-CM | POA: Diagnosis not present

## 2018-09-10 MED ORDER — SODIUM CHLORIDE 0.9% FLUSH
10.0000 mL | INTRAVENOUS | Status: DC | PRN
  Administered 2018-09-10: 10 mL
  Filled 2018-09-10: qty 10

## 2018-09-10 MED ORDER — HEPARIN SOD (PORK) LOCK FLUSH 100 UNIT/ML IV SOLN
500.0000 [IU] | Freq: Once | INTRAVENOUS | Status: AC | PRN
  Administered 2018-09-10: 500 [IU]
  Filled 2018-09-10: qty 5

## 2018-09-10 MED ORDER — TRASTUZUMAB CHEMO 150 MG IV SOLR
600.0000 mg | Freq: Once | INTRAVENOUS | Status: AC
  Administered 2018-09-10: 600 mg via INTRAVENOUS
  Filled 2018-09-10: qty 28.57

## 2018-09-10 MED ORDER — SODIUM CHLORIDE 0.9 % IV SOLN
Freq: Once | INTRAVENOUS | Status: AC
  Administered 2018-09-10: 15:00:00 via INTRAVENOUS
  Filled 2018-09-10: qty 250

## 2018-09-10 MED ORDER — ACETAMINOPHEN 325 MG PO TABS: 650.0000 mg | ORAL_TABLET | Freq: Once | ORAL | Status: DC

## 2018-09-10 NOTE — Progress Notes (Signed)
[name] [name]   [phone] [phone]) [phone]   Clinic Follow up Note   Patient Care Team: Rodney Langton as PCP - General (Physician Assistant) [name], DO (Family Medicine) Holley Bouche, NP (Inactive) as Nurse Practitioner (Nurse Practitioner) Stark Klein, MD as Consulting Physician (General Surgery) Arloa Koh, MD as Consulting Physician (Radiation Oncology) [name], MD as Consulting Physician (Hematology)  Date of Service:  09/10/2018  CHIEF COMPLAINT: F/u of right breast cancer  SUMMARY OF ONCOLOGIC HISTORY: Oncology History   Breast cancer 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   Staging form: Breast, AJCC 7th Edition     Clinical stage from 07/22/2014: Stage IV (T3, N1, M1) - Unsigned       Breast cancer 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 ([place])   07/02/2014 Mammogram    Mammogram showed a 2cm right beast mass and a 1.8cm right axillary node. MRI breast on 07/16/2014 showed 7cm R breast lesion and 4.4cm r axillary node     07/02/2014 Imaging    CT CAP: a 4.7cm mass in LUL lung and a 2.1cm mas in RML, and a small nodule in RUL, suspecious for metastasis      07/09/2014 Initial Diagnosis    right IDA with b/l lung lesions, ER-/PR-/HER2+    07/09/2014 Initial Biopsy    US guided right breast mass and axillary node biopsy showed IDA, and DCIS, ER-/PR-/HER2+    07/26/2014 Pathologic Stage    Left lung mass by IR, path revealed high grade carcinoma, morphology similar to breast tumor biopsy, TTF(-), NapsinA(-), ER(-)    08/04/2014 - 03/22/2015 Chemotherapy    weekly Paclitaxel 61m/m2, trastuzumab and pertuzumab every 3 weeks    08/30/2014 Genetic Testing    BreastNext panel was negative. 17 genes including BRCA1, BRCA2, were negative for mutations.     10/04/2014 Imaging    Interval decrease in the right axillary lymphadenopathy. Bilateral pulmonary lesions with left hilar lymphadenopathy also markedly decreased in the interval. The left hilar  lymphadenopathy has resolved.    12/20/2014 Imaging    restaging CT showed stable disease, no new lesions     03/29/2015 Pathology Results     right breast lumpectomy showed  chemotherapy treatment effect,  a 1 mm residual tumor,   margins were widely negative, 5 sentinel lymph nodes and 2 axillary lymph nodes were negative.     03/29/2015 Surgery     right breast lumpectomy and sentinel lymph node biopsy  by Dr. BBarry Dienes   04/12/2015 -  Chemotherapy     Herceptin maintenance therapy , 6 mg/kg, every 3 weeks    05/03/2015 - 06/14/2015 Radiation Therapy    right breast adjuvant irradiation by Dr. MValere Dross    08/28/2016 Imaging    CT CAP w Contrast  IMPRESSION: 1. No acute process or evidence of metastatic disease within the chest, abdomen, or pelvis. 2. Similar to less well-defined left upper lobe density, likely scarring. No evidence of new or progressive pulmonary metastasis. 3. Right nephrolithiasis.    01/22/2017 Imaging    CT Chest W Contrast 01/22/17 IMPRESSION: 1. Stable exam. No new or progressive findings. No evidence for metastatic disease. 2. Left upper lobe architectural distortion/scarring is stable. 3. Nonobstructing right renal stone.    07/31/2017 Imaging    Bone Scan Whole Body 07/31/17  IMPRESSION: 1. No scintigraphic evidence of osseous metastatic disease. 2. Thoracolumbar scoliosis.     07/31/2017 Imaging    CT CAP W Contrast 07/31/17 IMPRESSION: 1. No findings of active  or recurrent malignancy. 2. Other imaging findings of potential clinical significance: Aortic Atherosclerosis (ICD10-I70.0). Mild cardiomegaly. Postoperative and radiation therapy findings in the right chest. Nonobstructive right nephrolithiasis. Thoracolumbar scoliosis.     01/20/2018 Echocardiogram    ECHO 01/20/2018 Study Conclusions  - Left ventricle: The cavity size was normal. Wall thickness was   normal. Systolic function was normal. The estimated ejection   fraction was in the  range of 55% to 60%. Wall motion was normal;   there were no regional wall motion abnormalities. Doppler   parameters are consistent with abnormal left ventricular   relaxation (grade 1 diastolic dysfunction). - Impressions: GLS -20.7% LS&' 10.4 cm.     02/17/2018 Imaging    02/17/2018 CT CAP IMPRESSION: 1. No evidence for residual or recurrent tumor or metastatic disease. 2.  Aortic Atherosclerosis (ICD10-I70.0). 3. Right renal calculi. 4. Scoliosis.    08/11/2018 Imaging    CT CAP W Contrast 08/11/18  IMPRESSION: No evidence for localized recurrence or metastatic disease in the chest, abdomen or pelvis.      CURRENT THERAPY:  Maintenance Herceptin every 3 weeks since 04/12/15  INTERVAL HISTORY:  [name] [name] is here for a follow up of right breast cancer and Herceptin treatment. She presents to the clinic today with her mother. She notes she is doing well. She notes pin pricking feeling in her hands and feet occasionally but these sensations spike at times in certain feet or fingers. She note this has been going on for years.  She notes her BP has improved and denies headaches or chest pain. She wants to continue treatment every 3 weeks and on Wednesday afternoons.      REVIEW OF SYSTEMS:   Constitutional: Denies fevers, chills or abnormal weight loss Eyes: Denies blurriness of vision Ears, nose, mouth, throat, and face: Denies mucositis or sore throat Respiratory: Denies cough, dyspnea or wheezes Cardiovascular: Denies palpitation, chest discomfort or lower extremity swelling Gastrointestinal:  Denies nausea, heartburn or change in bowel habits Skin: Denies abnormal skin rashes Lymphatics: Denies new lymphadenopathy or easy bruising Neurological:Denies new weaknesses (+) tingling sensation in feet and fingers Behavioral/Psych: Mood is stable, no new changes  All other systems were reviewed with the patient and are negative.  MEDICAL HISTORY:  Past Medical History:    Diagnosis Date  . Breast cancer ([place])   . Breast cancer ([place])   . GERD (gastroesophageal reflux disease)    during pregnancy   . Pneumonia    hx of pneumonia 08/2013   . S/P radiation therapy 05/03/2015 through 06/14/2015    Right breast 4680 cGy in 26 sessions, right breast boost 1000 cGy in 5 sessions. Right supraclavicular/axillary region 4680 cGy with a supplemental PA field to bring the axillary dose up to 4500 cGy in 26 sessions    SURGICAL HISTORY: Past Surgical History:  Procedure Laterality Date  . BREAST LUMPECTOMY WITH RADIOACTIVE SEED AND SENTINEL LYMPH NODE BIOPSY Right 03/29/2015   Procedure: BREAST LUMPECTOMY WITH RADIOACTIVE SEED AND SENTINEL LYMPH NODE BIOPSY;  Surgeon: Stark Klein, MD;  Location: [facility];  Service: General;  Laterality: Right;  . CESAREAN SECTION    . CHOLECYSTECTOMY    . ESSURE TUBAL LIGATION    . PORTACATH PLACEMENT Left 07/21/2014   Procedure: INSERTION PORT-A-CATH;  Surgeon: Stark Klein, MD;  Location: [facility];  Service: General;  Laterality: Left;  . WISDOM TOOTH EXTRACTION      I have reviewed the social history and family history with the  patient and they are unchanged from previous note.  ALLERGIES:  is allergic to adhesive [tape]; aspirin; and codeine.  MEDICATIONS:  Current Outpatient Medications  Medication Sig Dispense Refill  . Calcium-Phosphorus-Vitamin D (CALCIUM GUMMIES PO) Take by mouth daily.    . Camphor-Eucalyptus-Menthol (VICKS VAPORUB EX) Apply 1 application topically at bedtime. Applies under nose, throat and on chest.    . cetirizine (ZYRTEC) 10 MG tablet Take 10 mg by mouth daily as needed for allergies (allergies).     . hydrocortisone 2.5 % cream APPLY TOPICALLY TO THE AFFECTED AREA TWICE DAILY 454 g 0  . ibuprofen (ADVIL,MOTRIN) 800 MG tablet Take 1 tablet (800 mg total) by mouth every 8 (eight) hours as needed. 60 tablet 1  . lidocaine-prilocaine  (EMLA) cream APPLY TOPICALLY TO THE AFFECTED AREA AS NEEDED 30 g 2  . loperamide (IMODIUM A-D) 2 MG tablet Take 1 tablet (2 mg total) by mouth 4 (four) times daily as needed for diarrhea or loose stools. 30 tablet 0  . losartan (COZAAR) 50 MG tablet Take 1 tablet (50 mg total) by mouth daily. 90 tablet 3  . meloxicam (MOBIC) 7.5 MG tablet Take by mouth.    . metaxalone (SKELAXIN) 800 MG tablet Take 1 tablet (800 mg total) by mouth 3 (three) times daily as needed for muscle spasms. (Patient not taking: Reported on 07/22/2018) 60 tablet 1  . Multiple Vitamins-Minerals (AIRBORNE PO) Take 1 tablet by mouth daily.    . Multiple Vitamins-Minerals (MULTIVITAMIN GUMMIES ADULT PO) Take 1 each by mouth daily. Women's Vitafusion gummie    . triamcinolone (KENALOG) 0.025 % ointment Apply 1 application topically 2 (two) times daily. (Patient not taking: Reported on 07/22/2018) 30 g 1   No current facility-administered medications for this visit.    Facility-Administered Medications Ordered in Other Visits  Medication Dose Route Frequency Provider Last Rate Last Dose  . acetaminophen (TYLENOL) tablet 650 mg  650 mg Oral Once [name], MD      . acetaminophen (TYLENOL) tablet 650 mg  650 mg Oral Once [name], MD      . heparin lock flush 100 unit/mL  500 Units Intracatheter Once PRN [name], MD      . sodium chloride 0.9 % injection 10 mL  10 mL Intracatheter PRN [name], MD   10 mL at 05/01/16 1622  . sodium chloride 0.9 % injection 10 mL  10 mL Intravenous PRN [name], MD      . sodium chloride flush (NS) 0.9 % injection 10 mL  10 mL Intracatheter PRN [name], MD      . trastuzumab (HERCEPTIN) 600 mg in sodium chloride 0.9 % 250 mL chemo infusion  600 mg Intravenous Once [name], MD        PHYSICAL EXAMINATION: ECOG PERFORMANCE STATUS: 0 - Asymptomatic  Vitals with BMI 09/10/2018  Height 5'10"  Weight 237 lbs  BMI 40.98  Systolic 119  Diastolic 80  Pulse 71  Respirations 16     GENERAL:alert, no distress and comfortable SKIN: skin color, texture, turgor are normal, no rashes or significant lesions EYES: normal, Conjunctiva are pink and non-injected, sclera clear OROPHARYNX:no exudate, no erythema and lips, buccal mucosa, and tongue normal  NECK: supple, thyroid normal size, non-tender, without nodularity LYMPH:  no palpable lymphadenopathy in the cervical, axillary or inguinal LUNGS: clear to auscultation and percussion with normal breathing effort HEART: regular rate & rhythm and no murmurs and no lower extremity edema ABDOMEN:abdomen soft, non-tender  and normal bowel sounds Musculoskeletal:no cyanosis of digits and no clubbing  NEURO: alert & oriented x 3 with fluent speech, no focal motor/sensory deficits  LABORATORY DATA:  I have reviewed the data as listed CBC Latest Ref Rng & Units 08/21/2018 07/09/2018 05/26/2018  WBC 4.0 - 10.5 K/uL 8.7 6.4 8.3  Hemoglobin 12.0 - 15.0 g/dL 12.1 11.4(L) 12.6  Hematocrit 36.0 - 46.0 % 37.1 34.2(L) 37.5  Platelets 150 - 400 K/uL 224 260 221     CMP Latest Ref Rng & Units 08/21/2018 07/09/2018 05/26/2018  Glucose 70 - 99 mg/dL 97 101(H) 109(H)  BUN 6 - 20 mg/dL 7 11 13   Creatinine 0.44 - 1.00 mg/dL 0.96 0.79 0.84  Sodium 135 - 145 mmol/L 138 141 140  Potassium 3.5 - 5.1 mmol/L 4.3 3.8 3.7  Chloride 98 - 111 mmol/L 104 105 103  CO2 22 - 32 mmol/L 25 27 26   Calcium 8.9 - 10.3 mg/dL 9.1 9.0 9.7  Total Protein 6.5 - 8.1 g/dL 7.8 7.6 8.1  Total Bilirubin 0.3 - 1.2 mg/dL 0.9 0.2(L) 0.5  Alkaline Phos 38 - 126 U/L 74 72 80  AST 15 - 41 U/L 17 17 24   ALT 0 - 44 U/L 20 20 33      RADIOGRAPHIC STUDIES: I have personally reviewed the radiological images as listed and agreed with the findings in the report. No results found.   ASSESSMENT & PLAN:  [name] [name] is a 48 y.o. 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 tolerating well. -We discussed her CT CAP from 08/11/18 which showed no evidence of recurrence or metastatic disease. I personal reviewed her scan images and discussed with her  -I discussed she has been on Herceptin for a while with NED and the option of switching her Herceptin to monthly treatments. She opted to continue treatment every 3 weeks. -Recent labs reviewed and CBC and CMP WNL 3 weeks ago.  -Monitor labs with every 3-4 treatments and continue Herceptin every 3 weeks.  -Her 07/2018 Mammogram was benign  -F/u every 12 weeks   2. HTN -On losartangivingby Dr. BHaroldine Lawsin June 2019, dose increased a few months ago  -07/22/18 ECHO showed EF at 60-65%, GLS 22% -BP at normal today at 139/82 (07/09/18), much better controlled lately   4. Intermittent Neuropathy  -Residual effects from her prior chemo treatment -Mostly tingling in her fingers and feet.  It has been intermittent, infrequent, every a few months and does not last very long. -I recommend OTC B complex and use warm compress and exercise more.    Plan -Will proceed with Herceptin today and continue every 3 weeks  -lab in 6 weeks (will do every 12 weeks in future)  -f/u in 12 weeks    No problem-specific Assessment & Plan notes found for this encounter.   No orders of the defined types were placed in this encounter.  All questions were answered. The patient knows to call the clinic with any problems, questions or concerns. No barriers to learning was detected. I spent 20 minutes counseling the patient face to face. The total time spent in the appointment was 25 minutes and more than 50% was on counseling and review of test results     YTruitt Merle MD 09/10/2018   I, AJoslyn Devon am acting as scribe for [employer]  Burr Medico, MD.   I have reviewed the above documentation for accuracy and completeness, and I agree with  the above.

## 2018-09-10 NOTE — Patient Instructions (Signed)
[facility] Discharge Instructions for Patients Receiving Chemotherapy  Today you received the following chemotherapy agents Herceptin  To help prevent nausea and vomiting after your treatment, we encourage you to take your nausea medication as directed   If you develop nausea and vomiting that is not controlled by your nausea medication, call the clinic.   BELOW ARE SYMPTOMS THAT SHOULD BE REPORTED IMMEDIATELY:  *FEVER GREATER THAN 100.5 F  *CHILLS WITH OR WITHOUT FEVER  NAUSEA AND VOMITING THAT IS NOT CONTROLLED WITH YOUR NAUSEA MEDICATION  *UNUSUAL SHORTNESS OF BREATH  *UNUSUAL BRUISING OR BLEEDING  TENDERNESS IN MOUTH AND THROAT WITH OR WITHOUT PRESENCE OF ULCERS  *URINARY PROBLEMS  *BOWEL PROBLEMS  UNUSUAL RASH Items with * indicate a potential emergency and should be followed up as soon as possible.  Feel free to call the clinic should you have any questions or concerns. The clinic phone number is [phone].  Please show the CHEMO ALERT CARD at check-in to the Emergency Department and triage nurse.   

## 2018-09-11 ENCOUNTER — Ambulatory Visit: Payer: BLUE CROSS/BLUE SHIELD

## 2018-09-11 ENCOUNTER — Ambulatory Visit: Payer: BLUE CROSS/BLUE SHIELD | Admitting: Hematology

## 2018-09-12 ENCOUNTER — Telehealth: Payer: Self-pay | Admitting: Hematology

## 2018-09-12 NOTE — Telephone Encounter (Signed)
Called patient to inform the patient that it was okay for me to cancel the lab on May 11,and that she will have a lab and port flush on her next md visit.

## 2018-09-12 NOTE — Telephone Encounter (Signed)
Scheduled appt pe 01/29 los.  I called the patient and we scheduled the appts over the phone, the patient stated she only has a lab and a port flush ever 12 weeks, and that was when she see the doctor, and that she did not need the lab and port flush 03/11.  I will message the MD first before I cancel it  Patient aware of appt times and dates.

## 2018-10-01 ENCOUNTER — Inpatient Hospital Stay: Payer: BLUE CROSS/BLUE SHIELD | Attending: Hematology

## 2018-10-01 VITALS — BP 151/75 | HR 66 | Temp 99.0°F | Resp 16 | Wt 230.0 lb

## 2018-10-01 DIAGNOSIS — C50911 Malignant neoplasm of unspecified site of right female breast: Secondary | ICD-10-CM | POA: Diagnosis present

## 2018-10-01 DIAGNOSIS — C7800 Secondary malignant neoplasm of unspecified lung: Secondary | ICD-10-CM

## 2018-10-01 DIAGNOSIS — Z5112 Encounter for antineoplastic immunotherapy: Secondary | ICD-10-CM | POA: Insufficient documentation

## 2018-10-01 MED ORDER — SODIUM CHLORIDE 0.9 % IV SOLN
Freq: Once | INTRAVENOUS | Status: AC
  Administered 2018-10-01: 16:00:00 via INTRAVENOUS
  Filled 2018-10-01: qty 250

## 2018-10-01 MED ORDER — SODIUM CHLORIDE 0.9% FLUSH
10.0000 mL | INTRAVENOUS | Status: DC | PRN
  Administered 2018-10-01: 10 mL
  Filled 2018-10-01: qty 10

## 2018-10-01 MED ORDER — HEPARIN SOD (PORK) LOCK FLUSH 100 UNIT/ML IV SOLN
500.0000 [IU] | Freq: Once | INTRAVENOUS | Status: AC | PRN
  Administered 2018-10-01: 500 [IU]
  Filled 2018-10-01: qty 5

## 2018-10-01 MED ORDER — TRASTUZUMAB CHEMO 150 MG IV SOLR
600.0000 mg | Freq: Once | INTRAVENOUS | Status: AC
  Administered 2018-10-01: 600 mg via INTRAVENOUS
  Filled 2018-10-01: qty 28.57

## 2018-10-01 MED ORDER — ACETAMINOPHEN 325 MG PO TABS: 650.0000 mg | ORAL_TABLET | Freq: Once | ORAL | Status: DC

## 2018-10-02 ENCOUNTER — Ambulatory Visit: Payer: BLUE CROSS/BLUE SHIELD

## 2018-10-07 ENCOUNTER — Telehealth: Payer: Self-pay

## 2018-10-07 NOTE — Telephone Encounter (Signed)
Faxed last office visit note to Adonis Brook [phone] with [facility]. Sent to HIM for scanning.

## 2018-10-22 ENCOUNTER — Other Ambulatory Visit: Payer: BLUE CROSS/BLUE SHIELD

## 2018-10-22 ENCOUNTER — Other Ambulatory Visit: Payer: Self-pay

## 2018-10-22 ENCOUNTER — Inpatient Hospital Stay: Payer: BLUE CROSS/BLUE SHIELD | Attending: Hematology

## 2018-10-22 VITALS — BP 163/84 | HR 87 | Temp 99.3°F | Resp 20

## 2018-10-22 DIAGNOSIS — Z5112 Encounter for antineoplastic immunotherapy: Secondary | ICD-10-CM | POA: Insufficient documentation

## 2018-10-22 DIAGNOSIS — C50911 Malignant neoplasm of unspecified site of right female breast: Secondary | ICD-10-CM | POA: Diagnosis present

## 2018-10-22 DIAGNOSIS — C7800 Secondary malignant neoplasm of unspecified lung: Secondary | ICD-10-CM

## 2018-10-22 MED ORDER — ACETAMINOPHEN 325 MG PO TABS: 650.0000 mg | ORAL_TABLET | Freq: Once | ORAL | Status: DC

## 2018-10-22 MED ORDER — TRASTUZUMAB CHEMO 150 MG IV SOLR
600.0000 mg | Freq: Once | INTRAVENOUS | Status: AC
  Administered 2018-10-22: 600 mg via INTRAVENOUS
  Filled 2018-10-22: qty 28.57

## 2018-10-22 MED ORDER — SODIUM CHLORIDE 0.9% FLUSH
10.0000 mL | INTRAVENOUS | Status: DC | PRN
  Administered 2018-10-22: 10 mL via INTRAVENOUS
  Filled 2018-10-22: qty 10

## 2018-10-22 MED ORDER — SODIUM CHLORIDE 0.9 % IV SOLN
Freq: Once | INTRAVENOUS | Status: AC
  Administered 2018-10-22: 16:00:00 via INTRAVENOUS
  Filled 2018-10-22: qty 250

## 2018-10-22 MED ORDER — HEPARIN SOD (PORK) LOCK FLUSH 100 UNIT/ML IV SOLN
500.0000 [IU] | Freq: Once | INTRAVENOUS | Status: AC | PRN
  Administered 2018-10-22: 500 [IU]
  Filled 2018-10-22: qty 5

## 2018-10-22 MED ORDER — SODIUM CHLORIDE 0.9 % IJ SOLN: 10.0000 mL | INTRAMUSCULAR | Status: DC | PRN

## 2018-10-22 NOTE — Patient Instructions (Signed)
[facility] Discharge Instructions for Patients Receiving Chemotherapy  Today you received the following chemotherapy agents Trastuzumab (HERCEPTIN).  To help prevent nausea and vomiting after your treatment, we encourage you to take your nausea medication as prescribed.   If you develop nausea and vomiting that is not controlled by your nausea medication, call the clinic.   BELOW ARE SYMPTOMS THAT SHOULD BE REPORTED IMMEDIATELY:  *FEVER GREATER THAN 100.5 F  *CHILLS WITH OR WITHOUT FEVER  NAUSEA AND VOMITING THAT IS NOT CONTROLLED WITH YOUR NAUSEA MEDICATION  *UNUSUAL SHORTNESS OF BREATH  *UNUSUAL BRUISING OR BLEEDING  TENDERNESS IN MOUTH AND THROAT WITH OR WITHOUT PRESENCE OF ULCERS  *URINARY PROBLEMS  *BOWEL PROBLEMS  UNUSUAL RASH Items with * indicate a potential emergency and should be followed up as soon as possible.  Feel free to call the clinic should you have any questions or concerns. The clinic phone number is [phone].  Please show the [place] at check-in to the Emergency Department and triage nurse.

## 2018-10-23 ENCOUNTER — Other Ambulatory Visit: Payer: BLUE CROSS/BLUE SHIELD

## 2018-10-23 ENCOUNTER — Ambulatory Visit: Payer: BLUE CROSS/BLUE SHIELD

## 2018-10-28 ENCOUNTER — Encounter (HOSPITAL_BASED_OUTPATIENT_CLINIC_OR_DEPARTMENT_OTHER): Payer: Self-pay | Admitting: Emergency Medicine

## 2018-10-28 ENCOUNTER — Emergency Department (HOSPITAL_BASED_OUTPATIENT_CLINIC_OR_DEPARTMENT_OTHER)
Admission: EM | Admit: 2018-10-28 | Discharge: 2018-10-28 | Disposition: A | Discharge status: Home / Self Care | Payer: BLUE CROSS/BLUE SHIELD | Attending: Emergency Medicine | Admitting: Emergency Medicine

## 2018-10-28 ENCOUNTER — Emergency Department (HOSPITAL_BASED_OUTPATIENT_CLINIC_OR_DEPARTMENT_OTHER): Payer: BLUE CROSS/BLUE SHIELD

## 2018-10-28 ENCOUNTER — Other Ambulatory Visit: Payer: Self-pay

## 2018-10-28 DIAGNOSIS — Z85118 Personal history of other malignant neoplasm of bronchus and lung: Secondary | ICD-10-CM | POA: Diagnosis not present

## 2018-10-28 DIAGNOSIS — R059 Cough, unspecified: Secondary | ICD-10-CM

## 2018-10-28 DIAGNOSIS — C50919 Malignant neoplasm of unspecified site of unspecified female breast: Secondary | ICD-10-CM | POA: Diagnosis not present

## 2018-10-28 DIAGNOSIS — R05 Cough: Secondary | ICD-10-CM | POA: Insufficient documentation

## 2018-10-28 DIAGNOSIS — Z923 Personal history of irradiation: Secondary | ICD-10-CM | POA: Diagnosis not present

## 2018-10-28 DIAGNOSIS — R072 Precordial pain: Secondary | ICD-10-CM | POA: Diagnosis not present

## 2018-10-28 DIAGNOSIS — R0789 Other chest pain: Secondary | ICD-10-CM | POA: Diagnosis present

## 2018-10-28 DIAGNOSIS — Z9221 Personal history of antineoplastic chemotherapy: Secondary | ICD-10-CM | POA: Insufficient documentation

## 2018-10-28 LAB — CBC WITH DIFFERENTIAL/PLATELET
ABS IMMATURE GRANULOCYTES: 0.02 10*3/uL (ref 0.00–0.07)
Basophils Absolute: 0 10*3/uL (ref 0.0–0.1)
Basophils Relative: 0 %
Eosinophils Absolute: 0.1 10*3/uL (ref 0.0–0.5)
Eosinophils Relative: 2 %
HCT: 36.4 % (ref 36.0–46.0)
Hemoglobin: 11.4 g/dL — ABNORMAL LOW (ref 12.0–15.0)
IMMATURE GRANULOCYTES: 0 %
Lymphocytes Relative: 27 %
Lymphs Abs: 2.1 10*3/uL (ref 0.7–4.0)
MCH: 28.7 pg (ref 26.0–34.0)
MCHC: 31.3 g/dL (ref 30.0–36.0)
MCV: 91.7 fL (ref 80.0–100.0)
Monocytes Absolute: 0.6 10*3/uL (ref 0.1–1.0)
Monocytes Relative: 8 %
NEUTROS PCT: 63 %
Neutro Abs: 5 10*3/uL (ref 1.7–7.7)
Platelets: 223 10*3/uL (ref 150–400)
RBC: 3.97 MIL/uL (ref 3.87–5.11)
RDW: 13.2 % (ref 11.5–15.5)
WBC: 7.9 10*3/uL (ref 4.0–10.5)
nRBC: 0 % (ref 0.0–0.2)

## 2018-10-28 LAB — BASIC METABOLIC PANEL
Anion gap: 6 (ref 5–15)
BUN: 15 mg/dL (ref 6–20)
CO2: 25 mmol/L (ref 22–32)
Calcium: 8.9 mg/dL (ref 8.9–10.3)
Chloride: 104 mmol/L (ref 98–111)
Creatinine, Ser: 0.83 mg/dL (ref 0.44–1.00)
GFR calc Af Amer: >60 mL/min (ref >60)
GFR calc non Af Amer: >60 mL/min (ref >60)
Glucose, Bld: 95 mg/dL (ref 70–99)
Potassium: 3.7 mmol/L (ref 3.5–5.1)
Sodium: 135 mmol/L (ref 135–145)

## 2018-10-28 LAB — TROPONIN I: Troponin I: <0.03 ng/mL (ref <0.03)

## 2018-10-28 MED ORDER — LIDOCAINE 4 % EX CREA
TOPICAL_CREAM | CUTANEOUS | Status: AC
  Filled 2018-10-28: qty 5

## 2018-10-28 MED ORDER — LIDOCAINE 4 % EX CREA
TOPICAL_CREAM | Freq: Once | CUTANEOUS | Status: AC
  Administered 2018-10-28: 1 via TOPICAL

## 2018-10-28 MED ORDER — HEPARIN SOD (PORK) LOCK FLUSH 100 UNIT/ML IV SOLN
INTRAVENOUS | Status: AC
  Administered 2018-10-28: 500 [IU]
  Filled 2018-10-28: qty 5

## 2018-10-28 MED ORDER — LIDOCAINE-PRILOCAINE 2.5-2.5 % EX CREA
TOPICAL_CREAM | Freq: Once | CUTANEOUS | Status: DC
  Filled 2018-10-28: qty 5

## 2018-10-28 NOTE — ED Notes (Signed)
Prior to attempting to stick patient, she informed nurse that she had "one stick" to get blood work on her. Pt advised that we would like to prevent accessing her port for risk of infection, but that if we cannot get any access, that we would access it. Pt requesting EMLA if we have to access her port.

## 2018-10-28 NOTE — Discharge Instructions (Signed)
You were seen in the ED today with chest pain and cough. You do not have evidence of pneumonia. Without fever you are low risk for severe viral infection. Call your PCP or see a Tele-medicine physician if you develop fever or worsening symptoms who can help you coordinate symptoms. Stay home and practice good social distancing for the next week. I have written a work note to this effect.

## 2018-10-28 NOTE — ED Provider Notes (Signed)
Emergency Department Provider Note   I have reviewed the triage vital signs and the nursing notes.   HISTORY  Chief Complaint Chest Pain and Cough   HPI [name] [name] is a 48 y.o. female with PMH of breast cancer s/p radiation/chemotherapy currently on suppression therapy presents to the emergency department for evaluation of left-sided chest discomfort, tightness, cough.  She has had symptoms for the past 3 days.  She describes associated body aches but denies fever.  She has checked her temperature at home on multiple occasions with T-max of 40F.  She denies hemoptysis.  Her pain is located in the left upper chest and not pleuritic.  Pain both at rest and with coughing.  She has no prior history of PE.  No travel history, exposure to known sick contacts, for known COVID-19 exposure. Patient works at a call center. Cough is dry.   Past Medical History:  Diagnosis Date  . Breast cancer ([place])   . Breast cancer ([place])   . GERD (gastroesophageal reflux disease)    during pregnancy   . Pneumonia    hx of pneumonia 08/2013   . S/P radiation therapy 05/03/2015 through 06/14/2015    Right breast 4680 cGy in 26 sessions, right breast boost 1000 cGy in 5 sessions. Right supraclavicular/axillary region 4680 cGy with a supplemental PA field to bring the axillary dose up to 4500 cGy in 26 sessions    Patient Active Problem List   Diagnosis Date Noted  . Port-A-Cath in place 08/21/2018  . Cellulitis of right breast 07/04/2018  . Right arm pain 04/09/2017  . Sinusitis 07/03/2016  . Candidiasis of breast 04/05/2016  . Eczema 04/05/2016  . Port catheter in place 12/06/2015  . Seasonal allergies 10/04/2015  . 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 Breast cancer 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 ([place]) 07/22/2014  . Family history of breast cancer 07/22/2014  . Lung mas bilaterial  07/22/2014    Past Surgical History:  Procedure Laterality Date  . BREAST LUMPECTOMY WITH RADIOACTIVE SEED AND SENTINEL LYMPH NODE BIOPSY Right 03/29/2015   Procedure: BREAST LUMPECTOMY WITH RADIOACTIVE SEED AND SENTINEL LYMPH NODE BIOPSY;  Surgeon: Stark Klein, MD;  Location: [facility];  Service: General;  Laterality: Right;  . CESAREAN SECTION    . CHOLECYSTECTOMY    . ESSURE TUBAL LIGATION    . PORTACATH PLACEMENT Left 07/21/2014   Procedure: INSERTION PORT-A-CATH;  Surgeon: Stark Klein, MD;  Location: [facility];  Service: General;  Laterality: Left;  . WISDOM TOOTH EXTRACTION     Allergies Adhesive [tape]; Aspirin; and Codeine  Family History  Problem Relation Age of Onset  . Breast cancer Mother 33  . Liver cancer Father   . Breast cancer Maternal Aunt 29  . Breast cancer Maternal Aunt 45  . Prostate cancer Maternal Grandfather 25  . Liver cancer Paternal Grandmother   . Prostate cancer Paternal Grandfather   . Prostate cancer Maternal Uncle 73  . Prostate cancer Maternal Uncle 68  . Breast cancer Cousin 8       mat first cousin    Social History Social History   Tobacco Use  . Smoking status: Never Smoker  . Smokeless tobacco: Never Used  Substance Use Topics  . Alcohol use: Yes    Comment: 2-3 drinks per week   . Drug use: No    Review of Systems  Constitutional: No fever/chills. Positive occasional body aches.  Eyes:  No visual changes. ENT: No sore throat. Cardiovascular: Positive chest pain. Respiratory: Denies shortness of breath. Positive dry cough.  Gastrointestinal: No abdominal pain.  No nausea, no vomiting.  No diarrhea.  No constipation. Genitourinary: Negative for dysuria. Musculoskeletal: Negative for back pain. Skin: Negative for rash. Neurological: Negative for headaches, focal weakness or numbness.  10-point ROS otherwise negative.  ____________________________________________   PHYSICAL EXAM:  VITAL SIGNS: ED Triage  Vitals  Enc Vitals Group     BP 10/28/18 2009 (!) 157/84     Pulse Rate 10/28/18 2009 75     Resp 10/28/18 2009 16     Temp 10/28/18 2009 99.1 F (37.3 C)     Temp Source 10/28/18 2009 Oral     SpO2 10/28/18 2009 100 %     Weight 10/28/18 2011 229 lb (103.9 kg)     Height 10/28/18 2011 5\' 10"  (1.778 m)   Constitutional: Alert and oriented. Well appearing and in no acute distress. Eyes: Conjunctivae are normal. Head: Atraumatic. Nose: No congestion/rhinnorhea. Mouth/Throat: Mucous membranes are moist. Neck: No stridor.   Cardiovascular: Normal rate, regular rhythm. Good peripheral circulation. Grossly normal heart sounds.   Respiratory: Normal respiratory effort.  No retractions. Lungs CTAB. Gastrointestinal: Soft and nontender. No distention.  Musculoskeletal: No lower extremity tenderness nor edema. No gross deformities of extremities. Neurologic:  Normal speech and language. No gross focal neurologic deficits are appreciated.  Skin:  Skin is warm, dry and intact. No rash noted.  ____________________________________________   LABS (all labs ordered are listed, but only abnormal results are displayed)  Labs Reviewed  CBC WITH DIFFERENTIAL/PLATELET - Abnormal; Notable for the following components:      Result Value   Hemoglobin 11.4 (*)    All other components within normal limits  BASIC METABOLIC PANEL  TROPONIN I   ____________________________________________  EKG   EKG Interpretation  Date/Time:  Tuesday October 28 2018 20:08:37 EDT Ventricular Rate:  76 PR Interval:    QRS Duration: 84 QT Interval:  369 QTC Calculation: 415 R Axis:   33 Text Interpretation:  Sinus rhythm Baseline wander in lead(s) V6 No STEMI.  Confirmed by Nanda Quinton [phone]) on 10/28/2018 8:38:42 PM       ____________________________________________  RADIOLOGY  Dg Chest 2 View  Result Date: 10/28/2018 CLINICAL DATA:  Initial evaluation for acute chest pain, cough. EXAM: CHEST - 2 VIEW  COMPARISON:  Prior CT from 08/11/2018. FINDINGS: Left-sided Port-A-Cath in place. Mild cardiomegaly. Mediastinal silhouette normal. Aortic atherosclerosis. Lungs mildly hypoinflated. No focal infiltrates. No edema or effusion. No pneumothorax. No acute osseous finding.  Surgical clips overlie the right axilla. IMPRESSION: No radiographic evidence for active cardiopulmonary disease. Electronically Signed   By: Jeannine Boga M.D.   On: 10/28/2018 21:03    ____________________________________________   PROCEDURES  Procedure(s) performed:   Procedures  None ____________________________________________   INITIAL IMPRESSION / ASSESSMENT AND PLAN / ED COURSE  Pertinent labs & imaging results that were available during my care of the patient were reviewed by me and considered in my medical decision making (see chart for details).  Patient presents to the emergency department for evaluation of cough, body aches, chest discomfort.  No fevers.  No sick contacts or travel to increased risk for COVID-19.  No active chemotherapy for the past 3 years.  Very low suspicion for PE with normal vitals and oxygen saturation.  EKG reviewed with no acute findings.  Plan for screening labs including troponin and chest x-ray. Do not plan  on flu testing with 3 days of symptoms as patient.   CXR and labs negative. Plan for supportive care and provided work note so that patient can self-isolate at home.  ____________________________________________  FINAL CLINICAL IMPRESSION(S) / ED DIAGNOSES  Final diagnoses:  Precordial chest pain  Cough     MEDICATIONS GIVEN DURING THIS VISIT:  Medications  lidocaine (LMX) 4 % cream (1 application Topical Given 10/28/18 2050)  heparin lock flush 100 UNIT/ML injection (500 Units  Given 10/28/18 2208)    Note:  This document was prepared using Dragon voice recognition software and may include unintentional dictation errors.  Nanda Quinton, MD Emergency Medicine     Marionna Gonia, [facility], MD 10/29/18 1255

## 2018-10-28 NOTE — ED Triage Notes (Signed)
Pt reports cough since Sun; body aches and CP started today; denies fever; sts CP is tightness in LT side and became more persistent tonight

## 2018-10-28 NOTE — ED Notes (Addendum)
Attempted 1 stick in left hand without success. Pt is tensing up and bracing herself prior to needle insertion.

## 2018-10-28 NOTE — ED Notes (Signed)
Port heparin flushed and dc'd/ Pt verbalizes understanding of dc instructions and denies any further needs at this time

## 2018-11-12 ENCOUNTER — Inpatient Hospital Stay: Payer: BLUE CROSS/BLUE SHIELD | Attending: Hematology

## 2018-11-12 ENCOUNTER — Other Ambulatory Visit: Payer: Self-pay

## 2018-11-12 ENCOUNTER — Other Ambulatory Visit: Payer: Self-pay | Admitting: Hematology

## 2018-11-12 VITALS — BP 148/83 | HR 77 | Temp 99.0°F | Resp 18

## 2018-11-12 DIAGNOSIS — Z452 Encounter for adjustment and management of vascular access device: Secondary | ICD-10-CM | POA: Insufficient documentation

## 2018-11-12 DIAGNOSIS — I10 Essential (primary) hypertension: Secondary | ICD-10-CM | POA: Insufficient documentation

## 2018-11-12 DIAGNOSIS — C50911 Malignant neoplasm of unspecified site of right female breast: Secondary | ICD-10-CM | POA: Diagnosis present

## 2018-11-12 DIAGNOSIS — Z171 Estrogen receptor negative status [ER-]: Secondary | ICD-10-CM | POA: Diagnosis not present

## 2018-11-12 DIAGNOSIS — G629 Polyneuropathy, unspecified: Secondary | ICD-10-CM | POA: Diagnosis not present

## 2018-11-12 DIAGNOSIS — C7800 Secondary malignant neoplasm of unspecified lung: Secondary | ICD-10-CM | POA: Diagnosis not present

## 2018-11-12 DIAGNOSIS — Z5112 Encounter for antineoplastic immunotherapy: Secondary | ICD-10-CM | POA: Insufficient documentation

## 2018-11-12 DIAGNOSIS — E669 Obesity, unspecified: Secondary | ICD-10-CM | POA: Diagnosis not present

## 2018-11-12 MED ORDER — SODIUM CHLORIDE 0.9 % IJ SOLN
10.0000 mL | INTRAMUSCULAR | Status: DC | PRN
  Administered 2018-11-12: 17:00:00 10 mL
  Filled 2018-11-12: qty 10

## 2018-11-12 MED ORDER — ACETAMINOPHEN 325 MG PO TABS: 650.0000 mg | ORAL_TABLET | Freq: Once | ORAL | Status: DC

## 2018-11-12 MED ORDER — HEPARIN SOD (PORK) LOCK FLUSH 100 UNIT/ML IV SOLN
500.0000 [IU] | Freq: Once | INTRAVENOUS | Status: AC | PRN
  Administered 2018-11-12: 17:00:00 500 [IU]
  Filled 2018-11-12: qty 5

## 2018-11-12 MED ORDER — TRASTUZUMAB CHEMO 150 MG IV SOLR
600.0000 mg | Freq: Once | INTRAVENOUS | Status: AC
  Administered 2018-11-12: 600 mg via INTRAVENOUS
  Filled 2018-11-12: qty 28.57

## 2018-11-12 MED ORDER — SODIUM CHLORIDE 0.9 % IV SOLN
Freq: Once | INTRAVENOUS | Status: AC
  Administered 2018-11-12: 16:00:00 via INTRAVENOUS
  Filled 2018-11-12: qty 250

## 2018-11-12 NOTE — Patient Instructions (Signed)
[facility] Discharge Instructions for Patients Receiving Chemotherapy  Today you received the following chemotherapy agents: Herceptin  To help prevent nausea and vomiting after your treatment, we encourage you to take your nausea medication as directed.   If you develop nausea and vomiting that is not controlled by your nausea medication, call the clinic.   BELOW ARE SYMPTOMS THAT SHOULD BE REPORTED IMMEDIATELY:  *FEVER GREATER THAN 100.5 F  *CHILLS WITH OR WITHOUT FEVER  NAUSEA AND VOMITING THAT IS NOT CONTROLLED WITH YOUR NAUSEA MEDICATION  *UNUSUAL SHORTNESS OF BREATH  *UNUSUAL BRUISING OR BLEEDING  TENDERNESS IN MOUTH AND THROAT WITH OR WITHOUT PRESENCE OF ULCERS  *URINARY PROBLEMS  *BOWEL PROBLEMS  UNUSUAL RASH Items with * indicate a potential emergency and should be followed up as soon as possible.  Feel free to call the clinic should you have any questions or concerns. The clinic phone number is [phone].  Please show the CHEMO ALERT CARD at check-in to the Emergency Department and triage nurse.  Coronavirus (COVID-19) Are you at risk?  Are you at risk for the Coronavirus (COVID-19)?  To be considered HIGH RISK for Coronavirus (COVID-19), you have to meet the following criteria:  . Traveled to [place], [place], [place], [place] or [place]; or in the [place] to [place], [place], [place], or [place]; and have fever, cough, and shortness of breath within the last 2 weeks of travel OR . Been in close contact with a person diagnosed with COVID-19 within the last 2 weeks and have fever, cough, and shortness of breath . IF YOU DO NOT MEET THESE CRITERIA, YOU ARE CONSIDERED LOW RISK FOR COVID-19.  What to do if you are HIGH RISK for COVID-19?  . If you are having a medical emergency, call 911. . Seek medical care right away. Before you go to a doctor's office, urgent care or emergency department, call ahead and tell them about your  recent travel, contact with someone diagnosed with COVID-19, and your symptoms. You should receive instructions from your physician's office regarding next steps of care.  . When you arrive at healthcare provider, tell the healthcare staff immediately you have returned from visiting [place], [place], [place], [place] or [place]; or traveled in the [place] to [place], [place], [place], or [place]; in the last two weeks or you have been in close contact with a person diagnosed with COVID-19 in the last 2 weeks.   . Tell the health care staff about your symptoms: fever, cough and shortness of breath. . After you have been seen by a medical provider, you will be either: o Tested for (COVID-19) and discharged home on quarantine except to seek medical care if symptoms worsen, and asked to  - Stay home and avoid contact with others until you get your results (4-5 days)  - Avoid travel on public transportation if possible (such as bus, train, or airplane) or o Sent to the Emergency Department by EMS for evaluation, COVID-19 testing, and possible admission depending on your condition and test results.  What to do if you are LOW RISK for COVID-19?  Reduce your risk of any infection by using the same precautions used for avoiding the common cold or flu:  . Wash your hands often with soap and warm water for at least 20 seconds.  If soap and water are not readily available, use an alcohol-based hand sanitizer with at least 60% alcohol.  . If coughing or sneezing,   cover your mouth and nose by coughing or sneezing into the elbow areas of your shirt or coat, into a tissue or into your sleeve (not your hands). . Avoid shaking hands with others and consider head nods or verbal greetings only. . Avoid touching your eyes, nose, or mouth with unwashed hands.  . Avoid close contact with people who are sick. . Avoid places or events with large numbers of people in one location, like concerts or sporting  events. . Carefully consider travel plans you have or are making. . If you are planning any travel outside or inside the [place], visit the CDC's Travelers' Health webpage for the latest health notices. . If you have some symptoms but not all symptoms, continue to monitor at home and seek medical attention if your symptoms worsen. . If you are having a medical emergency, call 911.   ADDITIONAL HEALTHCARE OPTIONS FOR PATIENTS  [facility] / e-Visit: [email or web address]         [facility]: [phone]  [facility]: [phone]                   [facility]: [phone]     

## 2018-12-01 NOTE — Progress Notes (Signed)
[facility]   Telephone:(336) [phone] Fax:(336) [phone]   Clinic Follow up Note   Patient Care Team: Rodney Langton as PCP - General (Physician Assistant) [name], DO (Family Medicine) Holley Bouche, NP (Inactive) as Nurse Practitioner (Nurse Practitioner) Stark Klein, MD as Consulting Physician (General Surgery) Arloa Koh, MD as Consulting Physician (Radiation Oncology) [name], MD as Consulting Physician (Hematology)  Date of Service:  12/03/2018  CHIEF COMPLAINT: F/u of right breast cancer  SUMMARY OF ONCOLOGIC HISTORY: Oncology History   Breast cancer 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   Staging form: Breast, AJCC 7th Edition     Clinical stage from 07/22/2014: Stage IV (T3, N1, M1) - Unsigned       Breast cancer 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 ([place])   07/02/2014 Mammogram    Mammogram showed a 2cm right beast mass and a 1.8cm right axillary node. MRI breast on 07/16/2014 showed 7cm R breast lesion and 4.4cm r axillary node     07/02/2014 Imaging    CT CAP: a 4.7cm mass in LUL lung and a 2.1cm mas in RML, and a small nodule in RUL, suspecious for metastasis      07/09/2014 Initial Diagnosis    right IDA with b/l lung lesions, ER-/PR-/HER2+    07/09/2014 Initial Biopsy    US guided right breast mass and axillary node biopsy showed IDA, and DCIS, ER-/PR-/HER2+    07/26/2014 Pathologic Stage    Left lung mass by IR, path revealed high grade carcinoma, morphology similar to breast tumor biopsy, TTF(-), NapsinA(-), ER(-)    08/04/2014 - 03/22/2015 Chemotherapy    weekly Paclitaxel 68m/m2, trastuzumab and pertuzumab every 3 weeks    08/30/2014 Genetic Testing    BreastNext panel was negative. 17 genes including BRCA1, BRCA2, were negative for mutations.     10/04/2014 Imaging    Interval decrease in the right axillary lymphadenopathy. Bilateral pulmonary lesions with left hilar lymphadenopathy also markedly decreased in the interval. The left hilar  lymphadenopathy has resolved.    12/20/2014 Imaging    restaging CT showed stable disease, no new lesions     03/29/2015 Pathology Results     right breast lumpectomy showed  chemotherapy treatment effect,  a 1 mm residual tumor,   margins were widely negative, 5 sentinel lymph nodes and 2 axillary lymph nodes were negative.     03/29/2015 Surgery     right breast lumpectomy and sentinel lymph node biopsy  by Dr. BBarry Dienes   04/12/2015 -  Chemotherapy     Herceptin maintenance therapy , 6 mg/kg, every 3 weeks    05/03/2015 - 06/14/2015 Radiation Therapy    right breast adjuvant irradiation by Dr. MValere Dross    08/28/2016 Imaging    CT CAP w Contrast  IMPRESSION: 1. No acute process or evidence of metastatic disease within the chest, abdomen, or pelvis. 2. Similar to less well-defined left upper lobe density, likely scarring. No evidence of new or progressive pulmonary metastasis. 3. Right nephrolithiasis.    01/22/2017 Imaging    CT Chest W Contrast 01/22/17 IMPRESSION: 1. Stable exam. No new or progressive findings. No evidence for metastatic disease. 2. Left upper lobe architectural distortion/scarring is stable. 3. Nonobstructing right renal stone.    07/31/2017 Imaging    Bone Scan Whole Body 07/31/17  IMPRESSION: 1. No scintigraphic evidence of osseous metastatic disease. 2. Thoracolumbar scoliosis.     07/31/2017 Imaging    CT CAP W Contrast 07/31/17 IMPRESSION: 1. No findings of active  or recurrent malignancy. 2. Other imaging findings of potential clinical significance: Aortic Atherosclerosis (ICD10-I70.0). Mild cardiomegaly. Postoperative and radiation therapy findings in the right chest. Nonobstructive right nephrolithiasis. Thoracolumbar scoliosis.     01/20/2018 Echocardiogram    ECHO 01/20/2018 Study Conclusions  - Left ventricle: The cavity size was normal. Wall thickness was   normal. Systolic function was normal. The estimated ejection   fraction was in the  range of 55% to 60%. Wall motion was normal;   there were no regional wall motion abnormalities. Doppler   parameters are consistent with abnormal left ventricular   relaxation (grade 1 diastolic dysfunction). - Impressions: GLS -20.7% LS&' 10.4 cm.     02/17/2018 Imaging    02/17/2018 CT CAP IMPRESSION: 1. No evidence for residual or recurrent tumor or metastatic disease. 2.  Aortic Atherosclerosis (ICD10-I70.0). 3. Right renal calculi. 4. Scoliosis.    08/11/2018 Imaging    CT CAP W Contrast 08/11/18  IMPRESSION: No evidence for localized recurrence or metastatic disease in the chest, abdomen or pelvis.      CURRENT THERAPY:  MaintenanceHerceptin every 3 weeks since 04/12/15  INTERVAL HISTORY:  [name] [name] is here for a follow up and treatment. She presents to the clinic today by herself. She notes she is doing well. She notes her port had an issue access so she has PTA and will try getting labs again. She went to ED last month due to cough and chest tightness and denied fever. She was found to have URI.  She notes on 11/27/18 she had hysteroscopy to remove polyps that were recently found. She notes to still having intermittent periods.     REVIEW OF SYSTEMS:   Constitutional: Denies fevers, chills or abnormal weight loss Eyes: Denies blurriness of vision Ears, nose, mouth, throat, and face: Denies mucositis or sore throat Respiratory: Denies cough, dyspnea or wheezes Cardiovascular: Denies palpitation, chest discomfort or lower extremity swelling Gastrointestinal:  Denies nausea, heartburn or change in bowel habits Skin: Denies abnormal skin rashes Lymphatics: Denies new lymphadenopathy or easy bruising Neurological:Denies numbness, tingling or new weaknesses Behavioral/Psych: Mood is stable, no new changes  All other systems were reviewed with the patient and are negative.  MEDICAL HISTORY:  Past Medical History:  Diagnosis Date  . Breast cancer ([place])   . Breast  cancer ([place])   . GERD (gastroesophageal reflux disease)    during pregnancy   . Pneumonia    hx of pneumonia 08/2013   . S/P radiation therapy 05/03/2015 through 06/14/2015    Right breast 4680 cGy in 26 sessions, right breast boost 1000 cGy in 5 sessions. Right supraclavicular/axillary region 4680 cGy with a supplemental PA field to bring the axillary dose up to 4500 cGy in 26 sessions    SURGICAL HISTORY: Past Surgical History:  Procedure Laterality Date  . BREAST LUMPECTOMY WITH RADIOACTIVE SEED AND SENTINEL LYMPH NODE BIOPSY Right 03/29/2015   Procedure: BREAST LUMPECTOMY WITH RADIOACTIVE SEED AND SENTINEL LYMPH NODE BIOPSY;  Surgeon: Stark Klein, MD;  Location: [facility];  Service: General;  Laterality: Right;  . CESAREAN SECTION    . CHOLECYSTECTOMY    . ESSURE TUBAL LIGATION    . PORTACATH PLACEMENT Left 07/21/2014   Procedure: INSERTION PORT-A-CATH;  Surgeon: Stark Klein, MD;  Location: [facility];  Service: General;  Laterality: Left;  . WISDOM TOOTH EXTRACTION      I have reviewed the social history and family history with the patient and they are unchanged from previous note.  ALLERGIES:  is allergic to adhesive [tape]; aspirin; and codeine.  MEDICATIONS:  Current Outpatient Medications  Medication Sig Dispense Refill  . acetaminophen (TYLENOL) 500 MG tablet Take 500 mg by mouth every 6 (six) hours as needed.    . Calcium-Phosphorus-Vitamin D (CALCIUM GUMMIES PO) Take by mouth daily.    . Camphor-Eucalyptus-Menthol (VICKS VAPORUB EX) Apply 1 application topically at bedtime. Applies under nose, throat and on chest.    . cetirizine (ZYRTEC) 10 MG tablet Take 10 mg by mouth daily as needed for allergies (allergies).     . hydrocortisone 2.5 % cream APPLY TOPICALLY TO THE AFFECTED AREA TWICE DAILY 454 g 0  . ibuprofen (ADVIL,MOTRIN) 800 MG tablet Take 1 tablet (800 mg total) by mouth every 8 (eight) hours as  needed. 60 tablet 1  . lidocaine-prilocaine (EMLA) cream APPLY TOPICALLY TO THE AFFECTED AREA AS NEEDED 30 g 2  . loperamide (IMODIUM A-D) 2 MG tablet Take 1 tablet (2 mg total) by mouth 4 (four) times daily as needed for diarrhea or loose stools. 30 tablet 0  . losartan (COZAAR) 50 MG tablet Take 1 tablet (50 mg total) by mouth daily. 90 tablet 3  . meloxicam (MOBIC) 7.5 MG tablet Take by mouth.    . metaxalone (SKELAXIN) 800 MG tablet Take 1 tablet (800 mg total) by mouth 3 (three) times daily as needed for muscle spasms. (Patient not taking: Reported on 07/22/2018) 60 tablet 1  . Multiple Vitamins-Minerals (AIRBORNE PO) Take 1 tablet by mouth daily.    . Multiple Vitamins-Minerals (MULTIVITAMIN GUMMIES ADULT PO) Take 1 each by mouth daily. Women's Vitafusion gummie    . triamcinolone (KENALOG) 0.025 % ointment Apply 1 application topically 2 (two) times daily. (Patient not taking: Reported on 07/22/2018) 30 g 1   No current facility-administered medications for this visit.    Facility-Administered Medications Ordered in Other Visits  Medication Dose Route Frequency Provider Last Rate Last Dose  . acetaminophen (TYLENOL) tablet 650 mg  650 mg Oral Once [name], MD      . sodium chloride 0.9 % injection 10 mL  10 mL Intracatheter PRN [name], MD   10 mL at 05/01/16 1622  . sodium chloride 0.9 % injection 10 mL  10 mL Intravenous PRN [name], MD        PHYSICAL EXAMINATION: ECOG PERFORMANCE STATUS: 0 - Asymptomatic  Vitals:   12/03/18 1454  BP: 111/75  Pulse: 75  Resp: 18  Temp: 98.7 F (37.1 C)  SpO2: 100%   Filed Weights   12/03/18 1454  Weight: 226 lb 6.4 oz (102.7 kg)    GENERAL:alert, no distress and comfortable SKIN: skin color, texture, turgor are normal, no rashes or significant lesions EYES: normal, Conjunctiva are pink and non-injected, sclera clear OROPHARYNX:no exudate, no erythema and lips, buccal mucosa, and tongue normal  NECK: supple, thyroid normal size,  non-tender, without nodularity LYMPH:  no palpable lymphadenopathy in the cervical, axillary or inguinal LUNGS: clear to auscultation and percussion with normal breathing effort HEART: regular rate & rhythm and no murmurs and no lower extremity edema ABDOMEN:abdomen soft, non-tender and normal bowel sounds Musculoskeletal:no cyanosis of digits and no clubbing  NEURO: alert & oriented x 3 with fluent speech, no focal motor/sensory deficits  LABORATORY DATA:  I have reviewed the data as listed CBC Latest Ref Rng & Units 12/03/2018 10/28/2018 08/21/2018  WBC 4.0 - 10.5 K/uL 6.6 7.9 8.7  Hemoglobin 12.0 - 15.0 g/dL 11.8(L) 11.4(L) 12.1  Hematocrit  36.0 - 46.0 % 36.4 36.4 37.1  Platelets 150 - 400 K/uL 245 223 224     CMP Latest Ref Rng & Units 12/03/2018 10/28/2018 08/21/2018  Glucose 70 - 99 mg/dL 86 95 97  BUN 6 - 20 mg/dL _0 Creatinine 0.44 - 1.00 mg/dL 0.81 0.83 0.96  Sodium 135 - 145 mmol/L 138 135 138  Potassium 3.5 - 5.1 mmol/L 4.0 3.7 4.3  Chloride 98 - 111 mmol/L 105 104 104  CO2 22 - 32 mmol/L _1 Calcium 8.9 - 10.3 mg/dL 8.8(L) 8.9 9.1  Total Protein 6.5 - 8.1 g/dL 7.8 - 7.8  Total Bilirubin 0.3 - 1.2 mg/dL 0.3 - 0.9  Alkaline Phos 38 - 126 U/L 75 - 74  AST 15 - 41 U/L 17 - 17  ALT 0 - 44 U/L 18 - 20      RADIOGRAPHIC STUDIES: I have personally reviewed the radiological images as listed and agreed with the findings in the report. No results found.   ASSESSMENT & PLAN:  [name] [name] is a 48 y.o. 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 tolerating well. -We discussed her CT CAP from 08/11/18 which showed no evidence of recurrence or metastatic disease. I personal reviewed her scan images and discussed with her  -I discussed she has been on Herceptin for  a while with NED and the option of switching her Herceptin to monthly treatments. She opted to continue treatment every 3 weeks. -She underwent hysteroscopy with D&C on 11/27/18 to remove benign polyps that were recently found.  -Next scan in June or July 2020, OK to postpone due to COVID-19 pandemic.  -She is clinically doing well, stable. Her 07/2018 Mammogram was benign. Physical exam unremarkable. Will proceed with maintenance Herceptin today and every 3 weeks.  -Given COVID-19 I discussed the option of reduced treatment to every 4 weeks. She declined at this time.  -Continue to monitor labs with every 9 weeks and continue Herceptin every 3 weeks.  -F/u every 12 weeks with restaging CT scan before   2. HTN -On losartangivingby Dr. BHaroldine Lawsin June 2019, dose increased a few months ago  -07/22/18 ECHO showed EF at 60-65%, GLS 22% -BP at111/75 (12/03/18) much better controlled lately  4. Intermittent Neuropathy  -Residual effects from her prior chemo treatment -I recommend OTC B complex and use warm compress and exercise more.  -Much improved or likely resolved now.   5. Obesity  -I encourage pt to watch her diet, exercise and try to loose some weight    Plan -Will proceed with Herceptin today and continue every 3 weeks  -Lab and flush in 6 weeks -F/u in 12 weeks with lab, flush and CT CAP with contrast a few days before    No problem-specific Assessment & Plan notes found for this encounter.   Orders Placed This Encounter  Procedures  . CT Abdomen Pelvis W Contrast    Standing Status:   Future    Standing Expiration Date:   12/03/2019    Order Specific Question:   If indicated for the ordered procedure, I authorize the administration of contrast media per Radiology protocol    Answer:   Yes    Order Specific Question:   Is patient pregnant?    Answer:   No    Order Specific  Question:   Preferred imaging location?    Answer:   [facility]    Order Specific  Question:   Is Oral Contrast requested for this exam?    Answer:   Yes, Per Radiology protocol    Order Specific Question:   Radiology Contrast Protocol - do NOT remove file path    Answer:   \\[email or web address]  . CT Chest W Contrast    Standing Status:   Future    Standing Expiration Date:   12/03/2019    Order Specific Question:   If indicated for the ordered procedure, I authorize the administration of contrast media per Radiology protocol    Answer:   Yes    Order Specific Question:   Is patient pregnant?    Answer:   No    Order Specific Question:   Preferred imaging location?    Answer:   [facility]    Order Specific Question:   Radiology Contrast Protocol - do NOT remove file path    Answer:   \\[email or web address]   All questions were answered. The patient knows to call the clinic with any problems, questions or concerns. No barriers to learning was detected. I spent 20 minutes counseling the patient face to face. The total time spent in the appointment was 25 minutes and more than 50% was on counseling and review of test results     [name], MD 12/03/2018   I, Joslyn Devon, am acting as scribe for [name], MD.   I have reviewed the above documentation for accuracy and completeness, and I agree with the above.

## 2018-12-03 ENCOUNTER — Encounter: Payer: Self-pay | Admitting: Hematology

## 2018-12-03 ENCOUNTER — Inpatient Hospital Stay (HOSPITAL_BASED_OUTPATIENT_CLINIC_OR_DEPARTMENT_OTHER): Payer: BLUE CROSS/BLUE SHIELD | Admitting: Hematology

## 2018-12-03 ENCOUNTER — Inpatient Hospital Stay: Payer: BLUE CROSS/BLUE SHIELD

## 2018-12-03 ENCOUNTER — Other Ambulatory Visit: Payer: Self-pay

## 2018-12-03 VITALS — BP 111/75 | HR 75 | Temp 98.7°F | Resp 18 | Ht 70.0 in | Wt 226.4 lb

## 2018-12-03 DIAGNOSIS — C7800 Secondary malignant neoplasm of unspecified lung: Secondary | ICD-10-CM

## 2018-12-03 DIAGNOSIS — C50911 Malignant neoplasm of unspecified site of right female breast: Secondary | ICD-10-CM | POA: Diagnosis not present

## 2018-12-03 DIAGNOSIS — Z5112 Encounter for antineoplastic immunotherapy: Secondary | ICD-10-CM | POA: Diagnosis not present

## 2018-12-03 DIAGNOSIS — I10 Essential (primary) hypertension: Secondary | ICD-10-CM

## 2018-12-03 DIAGNOSIS — Z171 Estrogen receptor negative status [ER-]: Secondary | ICD-10-CM

## 2018-12-03 DIAGNOSIS — Z95828 Presence of other vascular implants and grafts: Secondary | ICD-10-CM

## 2018-12-03 DIAGNOSIS — E669 Obesity, unspecified: Secondary | ICD-10-CM

## 2018-12-03 DIAGNOSIS — G629 Polyneuropathy, unspecified: Secondary | ICD-10-CM

## 2018-12-03 LAB — COMPREHENSIVE METABOLIC PANEL
ALT: 18 U/L (ref 0–44)
AST: 17 U/L (ref 15–41)
Albumin: 3.5 g/dL (ref 3.5–5.0)
Alkaline Phosphatase: 75 U/L (ref 38–126)
Anion gap: 7 (ref 5–15)
BUN: 16 mg/dL (ref 6–20)
CO2: 26 mmol/L (ref 22–32)
Calcium: 8.8 mg/dL — ABNORMAL LOW (ref 8.9–10.3)
Chloride: 105 mmol/L (ref 98–111)
Creatinine, Ser: 0.81 mg/dL (ref 0.44–1.00)
GFR calc Af Amer: >60 mL/min (ref >60)
GFR calc non Af Amer: >60 mL/min (ref >60)
Glucose, Bld: 86 mg/dL (ref 70–99)
Potassium: 4 mmol/L (ref 3.5–5.1)
Sodium: 138 mmol/L (ref 135–145)
Total Bilirubin: 0.3 mg/dL (ref 0.3–1.2)
Total Protein: 7.8 g/dL (ref 6.5–8.1)

## 2018-12-03 LAB — CBC WITH DIFFERENTIAL/PLATELET
Abs Immature Granulocytes: 0.01 10*3/uL (ref 0.00–0.07)
Basophils Absolute: 0 10*3/uL (ref 0.0–0.1)
Basophils Relative: 1 %
Eosinophils Absolute: 0.1 10*3/uL (ref 0.0–0.5)
Eosinophils Relative: 2 %
HCT: 36.4 % (ref 36.0–46.0)
Hemoglobin: 11.8 g/dL — ABNORMAL LOW (ref 12.0–15.0)
Immature Granulocytes: 0 %
Lymphocytes Relative: 33 %
Lymphs Abs: 2.2 10*3/uL (ref 0.7–4.0)
MCH: 29.2 pg (ref 26.0–34.0)
MCHC: 32.4 g/dL (ref 30.0–36.0)
MCV: 90.1 fL (ref 80.0–100.0)
Monocytes Absolute: 0.5 10*3/uL (ref 0.1–1.0)
Monocytes Relative: 8 %
Neutro Abs: 3.7 10*3/uL (ref 1.7–7.7)
Neutrophils Relative %: 56 %
Platelets: 245 10*3/uL (ref 150–400)
RBC: 4.04 MIL/uL (ref 3.87–5.11)
RDW: 13.3 % (ref 11.5–15.5)
WBC: 6.6 10*3/uL (ref 4.0–10.5)
nRBC: 0 % (ref 0.0–0.2)

## 2018-12-03 MED ORDER — SODIUM CHLORIDE 0.9 % IJ SOLN
10.0000 mL | INTRAMUSCULAR | Status: DC | PRN
  Filled 2018-12-03: qty 10

## 2018-12-03 MED ORDER — SODIUM CHLORIDE 0.9% FLUSH
10.0000 mL | Freq: Once | INTRAVENOUS | Status: AC
  Administered 2018-12-03: 10 mL
  Filled 2018-12-03: qty 10

## 2018-12-03 MED ORDER — ACETAMINOPHEN 325 MG PO TABS: 650.0000 mg | ORAL_TABLET | Freq: Once | ORAL | Status: DC

## 2018-12-03 MED ORDER — TRASTUZUMAB CHEMO 150 MG IV SOLR
600.0000 mg | Freq: Once | INTRAVENOUS | Status: AC
  Administered 2018-12-03: 600 mg via INTRAVENOUS
  Filled 2018-12-03: qty 28.57

## 2018-12-03 MED ORDER — SODIUM CHLORIDE 0.9 % IV SOLN
Freq: Once | INTRAVENOUS | Status: AC
  Administered 2018-12-03: 16:00:00 via INTRAVENOUS
  Filled 2018-12-03: qty 250

## 2018-12-03 MED ORDER — ALTEPLASE 2 MG IJ SOLR
INTRAMUSCULAR | Status: AC
  Filled 2018-12-03: qty 2

## 2018-12-03 MED ORDER — HEPARIN SOD (PORK) LOCK FLUSH 100 UNIT/ML IV SOLN
500.0000 [IU] | Freq: Once | INTRAVENOUS | Status: AC | PRN
  Administered 2018-12-03: 500 [IU]
  Filled 2018-12-03: qty 5

## 2018-12-03 MED ORDER — ALTEPLASE 2 MG IJ SOLR
2.0000 mg | Freq: Once | INTRAMUSCULAR | Status: AC | PRN
  Administered 2018-12-03: 15:00:00 2 mg
  Filled 2018-12-03: qty 2

## 2018-12-03 MED ORDER — SODIUM CHLORIDE 0.9% FLUSH
10.0000 mL | Freq: Once | INTRAVENOUS | Status: AC
  Administered 2018-12-03: 10 mL via INTRAVENOUS
  Filled 2018-12-03: qty 10

## 2018-12-03 NOTE — Progress Notes (Signed)
Pt port flushed several times with no blood return. CATHFLOW administered by [place] RN. Pt would rather wait until [place] sits in port to have labs drawn through port vs peripherally. Explained CATHFLOW may or may not help with blood return after 30 min. Pt understood.

## 2018-12-03 NOTE — Patient Instructions (Signed)
Coronavirus (COVID-19) Are you at risk?  Are you at risk for the Coronavirus (COVID-19)?  To be considered HIGH RISK for Coronavirus (COVID-19), you have to meet the following criteria:  . Traveled to [place], [place], [place], [place] or [place]; or in the [place] to [place], [place], [place], or [place]; and have fever, cough, and shortness of breath within the last 2 weeks of travel OR . Been in close contact with a person diagnosed with COVID-19 within the last 2 weeks and have fever, cough, and shortness of breath . IF YOU DO NOT MEET THESE CRITERIA, YOU ARE CONSIDERED LOW RISK FOR COVID-19.  What to do if you are HIGH RISK for COVID-19?  . If you are having a medical emergency, call 911. . Seek medical care right away. Before you go to a doctor's office, urgent care or emergency department, call ahead and tell them about your recent travel, contact with someone diagnosed with COVID-19, and your symptoms. You should receive instructions from your physician's office regarding next steps of care.  . When you arrive at healthcare provider, tell the healthcare staff immediately you have returned from visiting [place], [place], [place], [place] or [place]; or traveled in the [place] to [place], [place], [place], or [place]; in the last two weeks or you have been in close contact with a person diagnosed with COVID-19 in the last 2 weeks.   . Tell the health care staff about your symptoms: fever, cough and shortness of breath. . After you have been seen by a medical provider, you will be either: o Tested for (COVID-19) and discharged home on quarantine except to seek medical care if symptoms worsen, and asked to  - Stay home and avoid contact with others until you get your results (4-5 days)  - Avoid travel on public transportation if possible (such as bus, train, or airplane) or o Sent to the Emergency Department by EMS for evaluation, COVID-19 testing, and possible  admission depending on your condition and test results.  What to do if you are LOW RISK for COVID-19?  Reduce your risk of any infection by using the same precautions used for avoiding the common cold or flu:  . Wash your hands often with soap and warm water for at least 20 seconds.  If soap and water are not readily available, use an alcohol-based hand sanitizer with at least 60% alcohol.  . If coughing or sneezing, cover your mouth and nose by coughing or sneezing into the elbow areas of your shirt or coat, into a tissue or into your sleeve (not your hands). . Avoid shaking hands with others and consider head nods or verbal greetings only. . Avoid touching your eyes, nose, or mouth with unwashed hands.  . Avoid close contact with people who are sick. . Avoid places or events with large numbers of people in one location, like concerts or sporting events. . Carefully consider travel plans you have or are making. . If you are planning any travel outside or inside the [place], visit the CDC's Travelers' Health webpage for the latest health notices. . If you have some symptoms but not all symptoms, continue to monitor at home and seek medical attention if your symptoms worsen. . If you are having a medical emergency, call 911.   ADDITIONAL HEALTHCARE OPTIONS FOR PATIENTS  [facility] / e-Visit: [email or web address]         [facility]: [phone]  [facility]   [facility]gent [facility]: [facility]: [facility] Discharge Instructions for Patients Receiving Chemotherapy  Today you received the following chemotherapy agents: Trastuzumab (Herceptin)  To help prevent nausea and vomiting after your treatment, we encourage you to take your nausea medication as directed.    If you develop nausea and vomiting that is not controlled by your nausea medication, call the clinic.    BELOW ARE SYMPTOMS THAT SHOULD BE REPORTED IMMEDIATELY:  *FEVER GREATER THAN 100.5 F  *CHILLS WITH OR WITHOUT FEVER  NAUSEA AND VOMITING THAT IS NOT CONTROLLED WITH YOUR NAUSEA MEDICATION  *UNUSUAL SHORTNESS OF BREATH  *UNUSUAL BRUISING OR BLEEDING  TENDERNESS IN MOUTH AND THROAT WITH OR WITHOUT PRESENCE OF ULCERS  *URINARY PROBLEMS  *BOWEL PROBLEMS  UNUSUAL RASH Items with * indicate a potential emergency and should be followed up as soon as possible.  Feel free to call the clinic should you have any questions or concerns. The clinic phone number is [phone].  Please show the [place] at check-in to the Emergency Department and triage nurse.

## 2018-12-03 NOTE — Progress Notes (Signed)
CathFlow removed from pt's PAC from [facility] after pt finished appointment with Dr. Burr Medico. [facility] without issue[facility] sent to [facility]-lab around 15:40 once pt arrived to treatment area

## 2018-12-04 ENCOUNTER — Encounter: Payer: Self-pay | Admitting: Hematology

## 2018-12-04 ENCOUNTER — Telehealth: Payer: Self-pay | Admitting: Hematology

## 2018-12-04 LAB — CANCER ANTIGEN 27.29: CA 27.29: 8.4 U/mL (ref 0.0–38.6)

## 2018-12-04 NOTE — Telephone Encounter (Signed)
Scheduled appt per 4/22 los. °

## 2018-12-12 DIAGNOSIS — N946 Dysmenorrhea, unspecified: Secondary | ICD-10-CM | POA: Insufficient documentation

## 2018-12-24 ENCOUNTER — Other Ambulatory Visit: Payer: Self-pay

## 2018-12-24 ENCOUNTER — Ambulatory Visit: Payer: BLUE CROSS/BLUE SHIELD

## 2018-12-24 ENCOUNTER — Inpatient Hospital Stay: Payer: BC Managed Care – PPO | Attending: Hematology

## 2018-12-24 VITALS — BP 137/87 | HR 87 | Temp 99.0°F | Resp 16

## 2018-12-24 DIAGNOSIS — C50311 Malignant neoplasm of lower-inner quadrant of right female breast: Secondary | ICD-10-CM | POA: Insufficient documentation

## 2018-12-24 DIAGNOSIS — G629 Polyneuropathy, unspecified: Secondary | ICD-10-CM | POA: Diagnosis not present

## 2018-12-24 DIAGNOSIS — Z923 Personal history of irradiation: Secondary | ICD-10-CM | POA: Diagnosis not present

## 2018-12-24 DIAGNOSIS — I700 Atherosclerosis of aorta: Secondary | ICD-10-CM | POA: Diagnosis not present

## 2018-12-24 DIAGNOSIS — C7801 Secondary malignant neoplasm of right lung: Secondary | ICD-10-CM | POA: Diagnosis not present

## 2018-12-24 DIAGNOSIS — C773 Secondary and unspecified malignant neoplasm of axilla and upper limb lymph nodes: Secondary | ICD-10-CM | POA: Diagnosis not present

## 2018-12-24 DIAGNOSIS — Z171 Estrogen receptor negative status [ER-]: Secondary | ICD-10-CM | POA: Diagnosis not present

## 2018-12-24 DIAGNOSIS — I10 Essential (primary) hypertension: Secondary | ICD-10-CM | POA: Diagnosis not present

## 2018-12-24 DIAGNOSIS — Z5112 Encounter for antineoplastic immunotherapy: Secondary | ICD-10-CM | POA: Insufficient documentation

## 2018-12-24 DIAGNOSIS — C7802 Secondary malignant neoplasm of left lung: Secondary | ICD-10-CM | POA: Insufficient documentation

## 2018-12-24 DIAGNOSIS — C50911 Malignant neoplasm of unspecified site of right female breast: Secondary | ICD-10-CM

## 2018-12-24 DIAGNOSIS — E669 Obesity, unspecified: Secondary | ICD-10-CM | POA: Insufficient documentation

## 2018-12-24 MED ORDER — TRASTUZUMAB CHEMO 150 MG IV SOLR
600.0000 mg | Freq: Once | INTRAVENOUS | Status: AC
  Administered 2018-12-24: 600 mg via INTRAVENOUS
  Filled 2018-12-24: qty 28.57

## 2018-12-24 MED ORDER — SODIUM CHLORIDE 0.9% FLUSH
10.0000 mL | Freq: Once | INTRAVENOUS | Status: AC
  Administered 2018-12-24: 17:00:00 10 mL via INTRAVENOUS
  Filled 2018-12-24: qty 10

## 2018-12-24 MED ORDER — HEPARIN SOD (PORK) LOCK FLUSH 100 UNIT/ML IV SOLN
500.0000 [IU] | Freq: Once | INTRAVENOUS | Status: AC | PRN
  Administered 2018-12-24: 500 [IU]
  Filled 2018-12-24: qty 5

## 2018-12-24 MED ORDER — SODIUM CHLORIDE 0.9 % IV SOLN
Freq: Once | INTRAVENOUS | Status: AC
  Administered 2018-12-24: 16:00:00 via INTRAVENOUS
  Filled 2018-12-24: qty 250

## 2018-12-24 MED ORDER — ACETAMINOPHEN 325 MG PO TABS: 650.0000 mg | ORAL_TABLET | Freq: Once | ORAL | Status: DC

## 2018-12-24 MED ORDER — SODIUM CHLORIDE 0.9 % IJ SOLN: 10.0000 mL | INTRAMUSCULAR | Status: DC | PRN

## 2018-12-24 NOTE — Patient Instructions (Signed)
House Discharge Instructions for Patients Receiving Chemotherapy  Today you received the following chemotherapy agents:  trastuzumab (Herceptin)  To help prevent nausea and vomiting after your treatment, we encourage you to take your nausea medication as prescribed.   If you develop nausea and vomiting that is not controlled by your nausea medication, call the clinic.   BELOW ARE SYMPTOMS THAT SHOULD BE REPORTED IMMEDIATELY:  *FEVER GREATER THAN 100.5 F  *CHILLS WITH OR WITHOUT FEVER  NAUSEA AND VOMITING THAT IS NOT CONTROLLED WITH YOUR NAUSEA MEDICATION  *UNUSUAL SHORTNESS OF BREATH  *UNUSUAL BRUISING OR BLEEDING  TENDERNESS IN MOUTH AND THROAT WITH OR WITHOUT PRESENCE OF ULCERS  *URINARY PROBLEMS  *BOWEL PROBLEMS  UNUSUAL RASH Items with * indicate a potential emergency and should be followed up as soon as possible.  Feel free to call the clinic should you have any questions or concerns. The clinic phone number is [phone].  Please show the [facility] at check-in to the Emergency Department and triage nurse.

## 2019-01-14 ENCOUNTER — Inpatient Hospital Stay: Payer: BC Managed Care – PPO | Attending: Hematology

## 2019-01-14 ENCOUNTER — Other Ambulatory Visit: Payer: BLUE CROSS/BLUE SHIELD

## 2019-01-14 ENCOUNTER — Ambulatory Visit: Payer: BLUE CROSS/BLUE SHIELD

## 2019-01-14 ENCOUNTER — Other Ambulatory Visit: Payer: Self-pay

## 2019-01-14 VITALS — BP 137/79 | HR 80 | Temp 99.1°F | Resp 17

## 2019-01-14 DIAGNOSIS — C50911 Malignant neoplasm of unspecified site of right female breast: Secondary | ICD-10-CM

## 2019-01-14 DIAGNOSIS — Z5112 Encounter for antineoplastic immunotherapy: Secondary | ICD-10-CM | POA: Diagnosis present

## 2019-01-14 DIAGNOSIS — C50311 Malignant neoplasm of lower-inner quadrant of right female breast: Secondary | ICD-10-CM | POA: Insufficient documentation

## 2019-01-14 MED ORDER — ACETAMINOPHEN 325 MG PO TABS: 650.0000 mg | ORAL_TABLET | Freq: Once | ORAL | Status: DC

## 2019-01-14 MED ORDER — HEPARIN SOD (PORK) LOCK FLUSH 100 UNIT/ML IV SOLN
500.0000 [IU] | Freq: Once | INTRAVENOUS | Status: AC | PRN
  Administered 2019-01-14: 500 [IU]
  Filled 2019-01-14: qty 5

## 2019-01-14 MED ORDER — SODIUM CHLORIDE 0.9% FLUSH
10.0000 mL | INTRAVENOUS | Status: DC | PRN
  Administered 2019-01-14: 10 mL
  Filled 2019-01-14: qty 10

## 2019-01-14 MED ORDER — TRASTUZUMAB CHEMO 150 MG IV SOLR
600.0000 mg | Freq: Once | INTRAVENOUS | Status: AC
  Administered 2019-01-14: 600 mg via INTRAVENOUS
  Filled 2019-01-14: qty 14.29

## 2019-01-14 MED ORDER — SODIUM CHLORIDE 0.9 % IJ SOLN: 10.0000 mL | INTRAMUSCULAR | Status: DC | PRN

## 2019-01-14 MED ORDER — SODIUM CHLORIDE 0.9 % IV SOLN
Freq: Once | INTRAVENOUS | Status: AC
  Administered 2019-01-14: 16:00:00 via INTRAVENOUS
  Filled 2019-01-14: qty 250

## 2019-01-14 NOTE — Patient Instructions (Signed)
[facility] Discharge Instructions for Patients Receiving Chemotherapy  Today you received the following chemotherapy agents: Trastuzumab (Herceptin)  To help prevent nausea and vomiting after your treatment, we encourage you to take your nausea medication as directed.    If you develop nausea and vomiting that is not controlled by your nausea medication, call the clinic.   BELOW ARE SYMPTOMS THAT SHOULD BE REPORTED IMMEDIATELY:  *FEVER GREATER THAN 100.5 F  *CHILLS WITH OR WITHOUT FEVER  NAUSEA AND VOMITING THAT IS NOT CONTROLLED WITH YOUR NAUSEA MEDICATION  *UNUSUAL SHORTNESS OF BREATH  *UNUSUAL BRUISING OR BLEEDING  TENDERNESS IN MOUTH AND THROAT WITH OR WITHOUT PRESENCE OF ULCERS  *URINARY PROBLEMS  *BOWEL PROBLEMS  UNUSUAL RASH Items with * indicate a potential emergency and should be followed up as soon as possible.  Feel free to call the clinic should you have any questions or concerns. The clinic phone number is [phone].  Please show the [facility] at check-in to the Emergency Department and triage nurse.  Coronavirus (COVID-19) Are you at risk?  Are you at risk for the Coronavirus (COVID-19)?  To be considered HIGH RISK for Coronavirus (COVID-19), you have to meet the following criteria:  . Traveled to [place], [place], [place], [place] or [place]; or in the [place] to [place], [place], [place], or [place]; and have fever, cough, and shortness of breath within the last 2 weeks of travel OR . Been in close contact with a person diagnosed with COVID-19 within the last 2 weeks and have fever, cough, and shortness of breath . IF YOU DO NOT MEET THESE CRITERIA, YOU ARE CONSIDERED LOW RISK FOR COVID-19.  What to do if you are HIGH RISK for COVID-19?  Marland Kitchen If you are having a medical emergency, call 911. . Seek medical care right away. Before you go to a doctor's office, urgent care or emergency department, call ahead and tell them  about your recent travel, contact with someone diagnosed with COVID-19, and your symptoms. You should receive instructions from your physician's office regarding next steps of care.  . When you arrive at healthcare provider, tell the healthcare staff immediately you have returned from visiting [place], [place], [place], [place] or [place]; or traveled in the [place] to [place], [place], [place], or [place]; in the last two weeks or you have been in close contact with a person diagnosed with COVID-19 in the last 2 weeks.   . Tell the health care staff about your symptoms: fever, cough and shortness of breath. . After you have been seen by a medical provider, you will be either: o Tested for (COVID-19) and discharged home on quarantine except to seek medical care if symptoms worsen, and asked to  - Stay home and avoid contact with others until you get your results (4-5 days)  - Avoid travel on public transportation if possible (such as bus, train, or airplane) or o Sent to the Emergency Department by EMS for evaluation, COVID-19 testing, and possible admission depending on your condition and test results.  What to do if you are LOW RISK for COVID-19?  Reduce your risk of any infection by using the same precautions used for avoiding the common cold or flu:  Marland Kitchen Wash your hands often with soap and warm water for at least 20 seconds.  If soap and water are not readily available, use an alcohol-based hand sanitizer with at least 60% alcohol.  . If coughing  or sneezing, cover your mouth and nose by coughing or sneezing into the elbow areas of your shirt or coat, into a tissue or into your sleeve (not your hands). . Avoid shaking hands with others and consider head nods or verbal greetings only. . Avoid touching your eyes, nose, or mouth with unwashed hands.  . Avoid close contact with people who are sick. . Avoid places or events with large numbers of people in one location, like concerts or  sporting events. . Carefully consider travel plans you have or are making. . If you are planning any travel outside or inside the [place], visit the CDC's Travelers' Health webpage for the latest health notices. . If you have some symptoms but not all symptoms, continue to monitor at home and seek medical attention if your symptoms worsen. . If you are having a medical emergency, call 911.   [place] / e-Visit: [email or web address]         [facility]: [facility]: [phone]                   [facility]: [phone]

## 2019-02-04 ENCOUNTER — Inpatient Hospital Stay: Payer: BC Managed Care – PPO

## 2019-02-04 ENCOUNTER — Ambulatory Visit: Payer: BLUE CROSS/BLUE SHIELD

## 2019-02-04 ENCOUNTER — Other Ambulatory Visit: Payer: Self-pay

## 2019-02-04 VITALS — BP 159/75 | HR 89 | Temp 98.6°F | Resp 18

## 2019-02-04 DIAGNOSIS — C50911 Malignant neoplasm of unspecified site of right female breast: Secondary | ICD-10-CM

## 2019-02-04 DIAGNOSIS — Z5112 Encounter for antineoplastic immunotherapy: Secondary | ICD-10-CM | POA: Diagnosis not present

## 2019-02-04 MED ORDER — SODIUM CHLORIDE 0.9 % IJ SOLN: 10.0000 mL | INTRAMUSCULAR | Status: DC | PRN

## 2019-02-04 MED ORDER — SODIUM CHLORIDE 0.9 % IV SOLN
Freq: Once | INTRAVENOUS | Status: AC
  Administered 2019-02-04: 17:00:00 via INTRAVENOUS
  Filled 2019-02-04: qty 250

## 2019-02-04 MED ORDER — TRASTUZUMAB CHEMO 150 MG IV SOLR
600.0000 mg | Freq: Once | INTRAVENOUS | Status: AC
  Administered 2019-02-04: 600 mg via INTRAVENOUS
  Filled 2019-02-04: qty 28.57

## 2019-02-04 MED ORDER — ACETAMINOPHEN 325 MG PO TABS: 650.0000 mg | ORAL_TABLET | Freq: Once | ORAL | Status: DC

## 2019-02-04 MED ORDER — ACETAMINOPHEN 325 MG PO TABS
ORAL_TABLET | ORAL | Status: AC
  Filled 2019-02-04: qty 2

## 2019-02-04 MED ORDER — HEPARIN SOD (PORK) LOCK FLUSH 100 UNIT/ML IV SOLN
500.0000 [IU] | Freq: Once | INTRAVENOUS | Status: AC | PRN
  Administered 2019-02-04: 17:00:00 500 [IU]
  Filled 2019-02-04: qty 5

## 2019-02-04 MED ORDER — SODIUM CHLORIDE 0.9% FLUSH
10.0000 mL | INTRAVENOUS | Status: DC | PRN
  Administered 2019-02-04: 10 mL
  Filled 2019-02-04: qty 10

## 2019-02-04 NOTE — Patient Instructions (Signed)
[place] [facility] Discharge Instructions for Patients Receiving Chemotherapy  Today you received the following chemotherapy agents: Trastuzumab (Herceptin)  To help prevent nausea and vomiting after your treatment, we encourage you to take your nausea medication as directed.    If you develop nausea and vomiting that is not controlled by your nausea medication, call the clinic.   BELOW ARE SYMPTOMS THAT SHOULD BE REPORTED IMMEDIATELY:  *FEVER GREATER THAN 100.5 F  *CHILLS WITH OR WITHOUT FEVER  NAUSEA AND VOMITING THAT IS NOT CONTROLLED WITH YOUR NAUSEA MEDICATION  *UNUSUAL SHORTNESS OF BREATH  *UNUSUAL BRUISING OR BLEEDING  TENDERNESS IN MOUTH AND THROAT WITH OR WITHOUT PRESENCE OF ULCERS  *URINARY PROBLEMS  *BOWEL PROBLEMS  UNUSUAL RASH Items with * indicate a potential emergency and should be followed up as soon as possible.  Feel free to call the clinic should you have any questions or concerns. The clinic phone number is [phone].  Please show the [facility] at check-in to the Emergency Department and triage nurse.  Coronavirus (COVID-19) Are you at risk?  Are you at risk for the Coronavirus (COVID-19)?  To be considered HIGH RISK for Coronavirus (COVID-19), you have to meet the following criteria:  . Traveled to [place], [place], [place], [place] or [place]; or in the [place] to [place], [place], [place], or [place]; and have fever, cough, and shortness of breath within the last 2 weeks of travel OR . Been in close contact with a person diagnosed with COVID-19 within the last 2 weeks and have fever, cough, and shortness of breath . IF YOU DO NOT MEET THESE CRITERIA, YOU ARE CONSIDERED LOW RISK FOR COVID-19.  What to do if you are HIGH RISK for COVID-19?  Marland Kitchen If you are having a medical emergency, call 911. . Seek medical care right away. Before you go to a doctor's office, urgent care or emergency department, call ahead and tell them  about your recent travel, contact with someone diagnosed with COVID-19, and your symptoms. You should receive instructions from your physician's office regarding next steps of care.  . When you arrive at healthcare provider, tell the healthcare staff immediately you have returned from visiting [place], [place], [place], [place] or [place]; or traveled in the [place] to [place], [place], [place], or [place]; in the last two weeks or you have been in close contact with a person diagnosed with COVID-19 in the last 2 weeks.   . Tell the health care staff about your symptoms: fever, cough and shortness of breath. . After you have been seen by a medical provider, you will be either: o Tested for (COVID-19) and discharged home on quarantine except to seek medical care if symptoms worsen, and asked to  - Stay home and avoid contact with others until you get your results (4-5 days)  - Avoid travel on public transportation if possible (such as bus, train, or airplane) or o Sent to the Emergency Department by EMS for evaluation, COVID-19 testing, and possible admission depending on your condition and test results.  What to do if you are LOW RISK for COVID-19?  Reduce your risk of any infection by using the same precautions used for avoiding the common cold or flu:  Marland Kitchen Wash your hands often with soap and warm water for at least 20 seconds.  If soap and water are not readily available, use an alcohol-based hand sanitizer with at least 60% alcohol.  . If coughing  or sneezing, cover your mouth and nose by coughing or sneezing into the elbow areas of your shirt or coat, into a tissue or into your sleeve (not your hands). . Avoid shaking hands with others and consider head nods or verbal greetings only. . Avoid touching your eyes, nose, or mouth with unwashed hands.  . Avoid close contact with people who are sick. . Avoid places or events with large numbers of people in one location, like concerts or  sporting events. . Carefully consider travel plans you have or are making. . If you are planning any travel outside or inside the [place], visit the CDC's Travelers' Health webpage for the latest health notices. . If you have some symptoms but not all symptoms, continue to monitor at home and seek medical attention if your symptoms worsen. . If you are having a medical emergency, call 911.   [place] / e-Visit: [email or web address]         [facility]: [facility]: [phone]                   [facility]: [phone]

## 2019-02-05 ENCOUNTER — Telehealth: Payer: Self-pay | Admitting: Hematology

## 2019-02-05 NOTE — Telephone Encounter (Signed)
Called pt per 6/24 sch messag e- no answer . Left message for patient with appt date and time

## 2019-02-10 ENCOUNTER — Telehealth: Payer: Self-pay | Admitting: Hematology

## 2019-02-10 NOTE — Telephone Encounter (Signed)
Returned patient's phone call regarding rescheduling her 07/14 appointment, patient needs lab and port flush to be done before her CT scan on 07/13. Appointments now have been moved from 07/14 to 07/13.

## 2019-02-17 ENCOUNTER — Other Ambulatory Visit (HOSPITAL_COMMUNITY): Payer: Self-pay | Admitting: *Deleted

## 2019-02-17 DIAGNOSIS — C50919 Malignant neoplasm of unspecified site of unspecified female breast: Secondary | ICD-10-CM

## 2019-02-17 NOTE — Progress Notes (Signed)
Pt due for 6 m f/u and echo, order placed, appts sch

## 2019-02-23 ENCOUNTER — Other Ambulatory Visit: Payer: Self-pay

## 2019-02-23 ENCOUNTER — Ambulatory Visit (HOSPITAL_COMMUNITY)
Admission: RE | Admit: 2019-02-23 | Discharge: 2019-02-23 | Disposition: A | Discharge status: Home / Self Care | Payer: BC Managed Care – PPO | Source: Ambulatory Visit | Attending: Hematology | Admitting: Hematology

## 2019-02-23 ENCOUNTER — Encounter (HOSPITAL_COMMUNITY): Payer: Self-pay

## 2019-02-23 ENCOUNTER — Inpatient Hospital Stay: Payer: BC Managed Care – PPO | Attending: Hematology

## 2019-02-23 ENCOUNTER — Inpatient Hospital Stay: Payer: BC Managed Care – PPO

## 2019-02-23 DIAGNOSIS — Z5112 Encounter for antineoplastic immunotherapy: Secondary | ICD-10-CM | POA: Diagnosis not present

## 2019-02-23 DIAGNOSIS — Z452 Encounter for adjustment and management of vascular access device: Secondary | ICD-10-CM | POA: Insufficient documentation

## 2019-02-23 DIAGNOSIS — C50911 Malignant neoplasm of unspecified site of right female breast: Secondary | ICD-10-CM | POA: Diagnosis not present

## 2019-02-23 DIAGNOSIS — Z95828 Presence of other vascular implants and grafts: Secondary | ICD-10-CM

## 2019-02-23 DIAGNOSIS — C50311 Malignant neoplasm of lower-inner quadrant of right female breast: Secondary | ICD-10-CM | POA: Insufficient documentation

## 2019-02-23 DIAGNOSIS — Z171 Estrogen receptor negative status [ER-]: Secondary | ICD-10-CM | POA: Diagnosis not present

## 2019-02-23 DIAGNOSIS — E669 Obesity, unspecified: Secondary | ICD-10-CM | POA: Diagnosis not present

## 2019-02-23 DIAGNOSIS — G629 Polyneuropathy, unspecified: Secondary | ICD-10-CM | POA: Diagnosis not present

## 2019-02-23 DIAGNOSIS — C7800 Secondary malignant neoplasm of unspecified lung: Secondary | ICD-10-CM | POA: Diagnosis not present

## 2019-02-23 DIAGNOSIS — I10 Essential (primary) hypertension: Secondary | ICD-10-CM | POA: Insufficient documentation

## 2019-02-23 LAB — CBC WITH DIFFERENTIAL/PLATELET
Abs Immature Granulocytes: 0.02 10*3/uL (ref 0.00–0.07)
Basophils Absolute: 0 10*3/uL (ref 0.0–0.1)
Basophils Relative: 0 %
Eosinophils Absolute: 0.1 10*3/uL (ref 0.0–0.5)
Eosinophils Relative: 2 %
HCT: 35.9 % — ABNORMAL LOW (ref 36.0–46.0)
Hemoglobin: 11.7 g/dL — ABNORMAL LOW (ref 12.0–15.0)
Immature Granulocytes: 0 %
Lymphocytes Relative: 29 %
Lymphs Abs: 2 10*3/uL (ref 0.7–4.0)
MCH: 29.4 pg (ref 26.0–34.0)
MCHC: 32.6 g/dL (ref 30.0–36.0)
MCV: 90.2 fL (ref 80.0–100.0)
Monocytes Absolute: 0.6 10*3/uL (ref 0.1–1.0)
Monocytes Relative: 9 %
Neutro Abs: 4.1 10*3/uL (ref 1.7–7.7)
Neutrophils Relative %: 60 %
Platelets: 222 10*3/uL (ref 150–400)
RBC: 3.98 MIL/uL (ref 3.87–5.11)
RDW: 12.9 % (ref 11.5–15.5)
WBC: 6.9 10*3/uL (ref 4.0–10.5)
nRBC: 0 % (ref 0.0–0.2)

## 2019-02-23 LAB — COMPREHENSIVE METABOLIC PANEL
ALT: 26 U/L (ref 0–44)
AST: 21 U/L (ref 15–41)
Albumin: 3.7 g/dL (ref 3.5–5.0)
Alkaline Phosphatase: 73 U/L (ref 38–126)
Anion gap: 10 (ref 5–15)
BUN: 14 mg/dL (ref 6–20)
CO2: 24 mmol/L (ref 22–32)
Calcium: 9 mg/dL (ref 8.9–10.3)
Chloride: 106 mmol/L (ref 98–111)
Creatinine, Ser: 0.96 mg/dL (ref 0.44–1.00)
GFR calc Af Amer: >60 mL/min (ref >60)
GFR calc non Af Amer: >60 mL/min (ref >60)
Glucose, Bld: 104 mg/dL — ABNORMAL HIGH (ref 70–99)
Potassium: 4.1 mmol/L (ref 3.5–5.1)
Sodium: 140 mmol/L (ref 135–145)
Total Bilirubin: 0.5 mg/dL (ref 0.3–1.2)
Total Protein: 7.9 g/dL (ref 6.5–8.1)

## 2019-02-23 MED ORDER — HEPARIN SOD (PORK) LOCK FLUSH 100 UNIT/ML IV SOLN
500.0000 [IU] | Freq: Once | INTRAVENOUS | Status: AC
  Administered 2019-02-23: 10:00:00 500 [IU] via INTRAVENOUS

## 2019-02-23 MED ORDER — IOHEXOL 300 MG/ML  SOLN
100.0000 mL | Freq: Once | INTRAMUSCULAR | Status: AC | PRN
  Administered 2019-02-23: 10:00:00 100 mL via INTRAVENOUS

## 2019-02-23 MED ORDER — SODIUM CHLORIDE 0.9% FLUSH
10.0000 mL | Freq: Once | INTRAVENOUS | Status: DC
  Filled 2019-02-23: qty 10

## 2019-02-23 MED ORDER — HEPARIN SOD (PORK) LOCK FLUSH 100 UNIT/ML IV SOLN
INTRAVENOUS | Status: AC
  Filled 2019-02-23: qty 5

## 2019-02-23 MED ORDER — SODIUM CHLORIDE (PF) 0.9 % IJ SOLN
INTRAMUSCULAR | Status: AC
  Filled 2019-02-23: qty 50

## 2019-02-23 NOTE — Progress Notes (Signed)
[facility]   Telephone:([phone]) [phone] Fax:(336) [phone]   Clinic Follow up Note   Patient Care Team: Rodney Langton as PCP - General (Physician Assistant) [name], DO (Family Medicine) Holley Bouche, NP (Inactive) as Nurse Practitioner (Nurse Practitioner) Stark Klein, MD as Consulting Physician (General Surgery) Arloa Koh, MD (Inactive) as Consulting Physician (Radiation Oncology) [name], MD as Consulting Physician (Hematology)  Date of Service:  02/25/2019  CHIEF COMPLAINT: F/u of right breast cancer  SUMMARY OF ONCOLOGIC HISTORY: Oncology History Overview Note  Breast cancer 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   Staging form: Breast, AJCC 7th Edition     Clinical stage from 07/22/2014: Stage IV (T3, N1, M1) - Unsigned     Breast cancer 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 ([place])  07/02/2014 Mammogram   Mammogram showed a 2cm right beast mass and a 1.8cm right axillary node. MRI breast on 07/16/2014 showed 7cm R breast lesion and 4.4cm r axillary node    07/02/2014 Imaging   CT CAP: a 4.7cm mass in LUL lung and a 2.1cm mas in RML, and a small nodule in RUL, suspecious for metastasis     07/09/2014 Initial Diagnosis   right IDA with b/l lung lesions, ER-/PR-/HER2+   07/09/2014 Initial Biopsy   US guided right breast mass and axillary node biopsy showed IDA, and DCIS, ER-/PR-/HER2+   07/26/2014 Pathologic Stage   Left lung mass by IR, path revealed high grade carcinoma, morphology similar to breast tumor biopsy, TTF(-), NapsinA(-), ER(-)   08/04/2014 - 03/22/2015 Chemotherapy   weekly Paclitaxel 69m/m2, trastuzumab and pertuzumab every 3 weeks   08/30/2014 Genetic Testing   BreastNext panel was negative. 17 genes including BRCA1, BRCA2, were negative for mutations.    10/04/2014 Imaging   Interval decrease in the right axillary lymphadenopathy. Bilateral pulmonary lesions with left hilar lymphadenopathy also markedly decreased in the interval. The left  hilar lymphadenopathy has resolved.   12/20/2014 Imaging   restaging CT showed stable disease, no new lesions    03/29/2015 Pathology Results    right breast lumpectomy showed  chemotherapy treatment effect,  a 1 mm residual tumor,   margins were widely negative, 5 sentinel lymph nodes and 2 axillary lymph nodes were negative.    03/29/2015 Surgery    right breast lumpectomy and sentinel lymph node biopsy  by Dr. BBarry Dienes  04/12/2015 -  Chemotherapy    Herceptin maintenance therapy , 6 mg/kg, every 3 weeks   05/03/2015 - 06/14/2015 Radiation Therapy   right breast adjuvant irradiation by Dr. MValere Dross   08/28/2016 Imaging   CT CAP w Contrast  IMPRESSION: 1. No acute process or evidence of metastatic disease within the chest, abdomen, or pelvis. 2. Similar to less well-defined left upper lobe density, likely scarring. No evidence of new or progressive pulmonary metastasis. 3. Right nephrolithiasis.   01/22/2017 Imaging   CT Chest W Contrast 01/22/17 IMPRESSION: 1. Stable exam. No new or progressive findings. No evidence for metastatic disease. 2. Left upper lobe architectural distortion/scarring is stable. 3. Nonobstructing right renal stone.   07/31/2017 Imaging   Bone Scan Whole Body 07/31/17  IMPRESSION: 1. No scintigraphic evidence of osseous metastatic disease. 2. Thoracolumbar scoliosis.    07/31/2017 Imaging   CT CAP W Contrast 07/31/17 IMPRESSION: 1. No findings of active or recurrent malignancy. 2. Other imaging findings of potential clinical significance: Aortic Atherosclerosis (ICD10-I70.0). Mild cardiomegaly. Postoperative and radiation therapy findings in the right chest. Nonobstructive right nephrolithiasis. Thoracolumbar scoliosis.    01/20/2018  Echocardiogram   ECHO 01/20/2018 Study Conclusions  - Left ventricle: The cavity size was normal. Wall thickness was   normal. Systolic function was normal. The estimated ejection   fraction was in the range of 55%  to 60%. Wall motion was normal;   there were no regional wall motion abnormalities. Doppler   parameters are consistent with abnormal left ventricular   relaxation (grade 1 diastolic dysfunction). - Impressions: GLS -20.7% LS&' 10.4 cm.    02/17/2018 Imaging   02/17/2018 CT CAP IMPRESSION: 1. No evidence for residual or recurrent tumor or metastatic disease. 2.  Aortic Atherosclerosis (ICD10-I70.0). 3. Right renal calculi. 4. Scoliosis.   08/11/2018 Imaging   CT CAP W Contrast 08/11/18  IMPRESSION: No evidence for localized recurrence or metastatic disease in the chest, abdomen or pelvis.   02/23/2019 Imaging   CT CAP W Contrast  IMPRESSION: 1. No findings of active malignancy in the chest, abdomen, or pelvis. 2. 3 mm right kidney lower pole nonobstructive renal calculus. 3. Thoracolumbar scoliosis.      CURRENT THERAPY:  MaintenanceHerceptin every 3 weeks since 04/12/15  INTERVAL HISTORY:  [name] [name] is here for a follow up and treatment. She presents to the clinic alone. She notes she is doing well. 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 She denies any chest discomfort. She does have allergy symptoms still but much relief with Flonase. She her BP has increased lately. She notes her BP elevated again after being well controlled. She plans to see her cardiologist next week. She denies leg swelling. She does note residual neuropathy intermittently. This does not impact her activities.  She notes her hair is thinning, mainly at the top of her head. She has been tried different oils in her hair which has reduced the amount of shedding.     REVIEW OF SYSTEMS:   Constitutional: Denies fevers, chills or abnormal weight loss Eyes: Denies blurriness of vision Ears, nose, mouth, throat, and face: Denies mucositis or sore throat (+) allergy symptoms  Respiratory: Denies cough, dyspnea or wheezes Cardiovascular: Denies palpitation, chest  discomfort or lower extremity swelling Gastrointestinal:  Denies nausea, heartburn or change in bowel habits Skin: Denies abnormal skin rashes (+) hair thinning  Lymphatics: Denies new lymphadenopathy or easy bruising Neurological: (+) residual neuropathy in hands.  Behavioral/Psych: Mood is stable, no new changes  All other systems were reviewed with the patient and are negative.  MEDICAL HISTORY:  Past Medical History:  Diagnosis Date  . Breast cancer ([place])   . Breast cancer ([place])   . GERD (gastroesophageal reflux disease)    during pregnancy   . Pneumonia    hx of pneumonia 08/2013   . S/P radiation therapy 05/03/2015 through 06/14/2015    Right breast 4680 cGy in 26 sessions, right breast boost 1000 cGy in 5 sessions. Right supraclavicular/axillary region 4680 cGy with a supplemental PA field to bring the axillary dose up to 4500 cGy in 26 sessions    SURGICAL HISTORY: Past Surgical History:  Procedure Laterality Date  . BREAST LUMPECTOMY WITH RADIOACTIVE SEED AND SENTINEL LYMPH NODE BIOPSY Right 03/29/2015   Procedure: BREAST LUMPECTOMY WITH RADIOACTIVE SEED AND SENTINEL LYMPH NODE BIOPSY;  Surgeon: Stark Klein, MD;  Location: [facility];  Service: General;  Laterality: Right;  . CESAREAN SECTION    . CHOLECYSTECTOMY    . ESSURE TUBAL LIGATION    . PORTACATH PLACEMENT Left 07/21/2014   Procedure: INSERTION PORT-A-CATH;  Surgeon:  Stark Klein, MD;  Location: [facility];  Service: General;  Laterality: Left;  . WISDOM TOOTH EXTRACTION      I have reviewed the social history and family history with the patient and they are unchanged from previous note.  ALLERGIES:  is allergic to adhesive [tape]; aspirin; and codeine.  MEDICATIONS:  Current Outpatient Medications  Medication Sig Dispense Refill  . acetaminophen (TYLENOL) 500 MG tablet Take 500 mg by mouth every 6 (six) hours as needed.    .  Calcium-Phosphorus-Vitamin D (CALCIUM GUMMIES PO) Take by mouth daily.    . Camphor-Eucalyptus-Menthol (VICKS VAPORUB EX) Apply 1 application topically at bedtime. Applies under nose, throat and on chest.    . cetirizine (ZYRTEC) 10 MG tablet Take 10 mg by mouth daily as needed for allergies (allergies).     . fluticasone (FLONASE) 50 MCG/ACT nasal spray Place into both nostrils daily.    . hydrocortisone 2.5 % cream APPLY TOPICALLY TO THE AFFECTED AREA TWICE DAILY 454 g 0  . ibuprofen (ADVIL,MOTRIN) 800 MG tablet Take 1 tablet (800 mg total) by mouth every 8 (eight) hours as needed. 60 tablet 1  . lidocaine-prilocaine (EMLA) cream APPLY TOPICALLY TO THE AFFECTED AREA AS NEEDED 30 g 2  . loperamide (IMODIUM A-D) 2 MG tablet Take 1 tablet (2 mg total) by mouth 4 (four) times daily as needed for diarrhea or loose stools. 30 tablet 0  . losartan (COZAAR) 50 MG tablet Take 1 tablet (50 mg total) by mouth daily. 90 tablet 3  . meloxicam (MOBIC) 7.5 MG tablet Take by mouth.    . metaxalone (SKELAXIN) 800 MG tablet Take 1 tablet (800 mg total) by mouth 3 (three) times daily as needed for muscle spasms. 60 tablet 1  . Multiple Vitamins-Minerals (AIRBORNE PO) Take 1 tablet by mouth daily.    . Multiple Vitamins-Minerals (MULTIVITAMIN GUMMIES ADULT PO) Take 1 each by mouth daily. Women's Vitafusion gummie    . triamcinolone (KENALOG) 0.025 % ointment Apply 1 application topically 2 (two) times daily. 30 g 1   No current facility-administered medications for this visit.    Facility-Administered Medications Ordered in Other Visits  Medication Dose Route Frequency Provider Last Rate Last Dose  . acetaminophen (TYLENOL) tablet 650 mg  650 mg Oral Once [name], MD      . sodium chloride 0.9 % injection 10 mL  10 mL Intracatheter PRN [name], MD   10 mL at 05/01/16 1622  . sodium chloride 0.9 % injection 10 mL  10 mL Intravenous PRN [name], MD        PHYSICAL EXAMINATION: ECOG PERFORMANCE STATUS: 0 -  Asymptomatic  Vitals:   02/25/19 1400  BP: (!) 156/71  Pulse: 85  Resp: 18  Temp: 99.1 F (37.3 C)  SpO2: 99%   Filed Weights   02/25/19 1400  Weight: 231 lb 6.4 oz (105 kg)    GENERAL:alert, no distress and comfortable SKIN: skin color, texture, turgor are normal, no rashes or significant lesions EYES: normal, Conjunctiva are pink and non-injected, sclera clear  NECK: supple, thyroid normal size, non-tender, without nodularity LYMPH:  no palpable lymphadenopathy in the cervical, axillary  LUNGS: clear to auscultation and percussion with normal breathing effort HEART: regular rate & rhythm and no murmurs and no lower extremity edema ABDOMEN:abdomen soft, non-tender and normal bowel sounds Musculoskeletal:no cyanosis of digits and no clubbing  NEURO: alert & oriented x 3 with fluent speech, no focal motor/sensory deficits  LABORATORY DATA:  I  have reviewed the data as listed CBC Latest Ref Rng & Units 02/23/2019 12/03/2018 10/28/2018  WBC 4.0 - 10.5 K/uL 6.9 6.6 7.9  Hemoglobin 12.0 - 15.0 g/dL 11.7(L) 11.8(L) 11.4(L)  Hematocrit 36.0 - 46.0 % 35.9(L) 36.4 36.4  Platelets 150 - 400 K/uL 222 245 223     CMP Latest Ref Rng & Units 02/23/2019 12/03/2018 10/28/2018  Glucose 70 - 99 mg/dL 104(H) 86 95  BUN 6 - 20 mg/dL 14 16 15   Creatinine 0.44 - 1.00 mg/dL 0.96 0.81 0.83  Sodium 135 - 145 mmol/L 140 138 135  Potassium 3.5 - 5.1 mmol/L 4.1 4.0 3.7  Chloride 98 - 111 mmol/L 106 105 104  CO2 22 - 32 mmol/L 24 26 25   Calcium 8.9 - 10.3 mg/dL 9.0 8.8(L) 8.9  Total Protein 6.5 - 8.1 g/dL 7.9 7.8 -  Total Bilirubin 0.3 - 1.2 mg/dL 0.5 0.3 -  Alkaline Phos 38 - 126 U/L 73 75 -  AST 15 - 41 U/L 21 17 -  ALT 0 - 44 U/L 26 18 -      RADIOGRAPHIC STUDIES: I have personally reviewed the radiological images as listed and agreed with the findings in the report. No results found.   ASSESSMENT & PLAN:  [name] [name] is a 48 y.o. 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 tolerating well. -I discussed she has been on Herceptin for a while with NED and the option of switching her Herceptin to monthly treatments. She opted to continue treatment every 3 weeks. -She underwent hysteroscopy with D&C on 11/27/18 to remove benign polyps that were recently found. -I personally reviewed and discussed her CT CAP from 02/23/19 image findings with her  which shows no evidence of disease.  -She is clinically doing well. Lab reviewed from this week, her CBC and CMP are within normal limits except Hg 11.7 and G 104. CA 27.29 WNL. Her physical exam and her 07/2018 mammogram were unremarkable. There is no clinical concern for recurrence. -I recommend she take oral calcium to maintain Ca level. She is agreeable.  -Will proceed with maintenance Herceptin today and every 3 weeks. .  -F/u in 15 weeks   2. HTN -On losartangivingby Dr. BHaroldine Lawsin June 2019, dose increased in 08/2018.  -07/22/18 ECHO showed EF at 60-65%, GLS 22% -BP at156/71 (02/25/19)  did increase again lately. She will follow up with Cardiologist next week.   4. Intermittent Neuropathy  -Residual effects from her prior chemo treatment -I recommend OTC B complex and use warm compress and exercise more. -Residual neuropathy remains stable and intermittent.   5. Obesity  -I encourage pt to watch her diet, exercise and try to loose some weight   -She did lise weight, but gained some of this back. I encouraged her to remain active and eat healthy diet.    Plan -CT CAP reviewed, NED  -Will proceed with Herceptin today andcontinueevery 3 weeks  -Lab, flush and f/u in 15 weeks    No problem-specific Assessment & Plan notes found for this encounter.   No orders of the defined types were placed in this  encounter.  All questions were answered. The patient knows to call the clinic with any problems, questions or concerns. No barriers to learning was detected. I spent 15 minutes counseling the patient face to face. The total  time spent in the appointment was 25 minutes and more than 50% was on counseling and review of test results     [name], MD 02/25/2019   I, Joslyn Devon, am acting as scribe for [name], MD.   I have reviewed the above documentation for accuracy and completeness, and I agree with the above.

## 2019-02-24 ENCOUNTER — Other Ambulatory Visit: Payer: BLUE CROSS/BLUE SHIELD

## 2019-02-24 ENCOUNTER — Other Ambulatory Visit: Payer: Self-pay

## 2019-02-24 ENCOUNTER — Ambulatory Visit (HOSPITAL_COMMUNITY): Payer: BC Managed Care – PPO

## 2019-02-24 ENCOUNTER — Other Ambulatory Visit: Payer: BC Managed Care – PPO

## 2019-02-24 LAB — CANCER ANTIGEN 27.29: CA 27.29: 6.5 U/mL (ref 0.0–38.6)

## 2019-02-25 ENCOUNTER — Inpatient Hospital Stay (HOSPITAL_BASED_OUTPATIENT_CLINIC_OR_DEPARTMENT_OTHER): Payer: BC Managed Care – PPO | Admitting: Hematology

## 2019-02-25 ENCOUNTER — Other Ambulatory Visit: Payer: BLUE CROSS/BLUE SHIELD

## 2019-02-25 ENCOUNTER — Other Ambulatory Visit: Payer: Self-pay

## 2019-02-25 ENCOUNTER — Telehealth: Payer: Self-pay | Admitting: Hematology

## 2019-02-25 ENCOUNTER — Ambulatory Visit: Payer: BLUE CROSS/BLUE SHIELD | Admitting: Hematology

## 2019-02-25 ENCOUNTER — Encounter: Payer: Self-pay | Admitting: Hematology

## 2019-02-25 ENCOUNTER — Ambulatory Visit: Payer: BLUE CROSS/BLUE SHIELD

## 2019-02-25 ENCOUNTER — Inpatient Hospital Stay: Payer: BC Managed Care – PPO

## 2019-02-25 VITALS — BP 156/71 | HR 85 | Temp 99.1°F | Resp 18 | Ht 70.0 in | Wt 231.4 lb

## 2019-02-25 DIAGNOSIS — C50311 Malignant neoplasm of lower-inner quadrant of right female breast: Secondary | ICD-10-CM

## 2019-02-25 DIAGNOSIS — I10 Essential (primary) hypertension: Secondary | ICD-10-CM

## 2019-02-25 DIAGNOSIS — E669 Obesity, unspecified: Secondary | ICD-10-CM

## 2019-02-25 DIAGNOSIS — C50911 Malignant neoplasm of unspecified site of right female breast: Secondary | ICD-10-CM

## 2019-02-25 DIAGNOSIS — C7800 Secondary malignant neoplasm of unspecified lung: Secondary | ICD-10-CM

## 2019-02-25 DIAGNOSIS — G620 Drug-induced polyneuropathy: Secondary | ICD-10-CM

## 2019-02-25 DIAGNOSIS — G629 Polyneuropathy, unspecified: Secondary | ICD-10-CM

## 2019-02-25 DIAGNOSIS — Z171 Estrogen receptor negative status [ER-]: Secondary | ICD-10-CM | POA: Diagnosis not present

## 2019-02-25 DIAGNOSIS — Z5112 Encounter for antineoplastic immunotherapy: Secondary | ICD-10-CM | POA: Diagnosis not present

## 2019-02-25 DIAGNOSIS — T451X5A Adverse effect of antineoplastic and immunosuppressive drugs, initial encounter: Secondary | ICD-10-CM

## 2019-02-25 MED ORDER — HEPARIN SOD (PORK) LOCK FLUSH 100 UNIT/ML IV SOLN
500.0000 [IU] | Freq: Once | INTRAVENOUS | Status: AC | PRN
  Administered 2019-02-25: 500 [IU]
  Filled 2019-02-25: qty 5

## 2019-02-25 MED ORDER — TRASTUZUMAB CHEMO 150 MG IV SOLR
600.0000 mg | Freq: Once | INTRAVENOUS | Status: AC
  Administered 2019-02-25: 600 mg via INTRAVENOUS
  Filled 2019-02-25: qty 28.57

## 2019-02-25 MED ORDER — SODIUM CHLORIDE 0.9 % IV SOLN
Freq: Once | INTRAVENOUS | Status: AC
  Administered 2019-02-25: 15:00:00 via INTRAVENOUS
  Filled 2019-02-25: qty 250

## 2019-02-25 MED ORDER — SODIUM CHLORIDE 0.9 % IJ SOLN: 10.0000 mL | INTRAMUSCULAR | Status: DC | PRN

## 2019-02-25 MED ORDER — SODIUM CHLORIDE 0.9% FLUSH
10.0000 mL | Freq: Once | INTRAVENOUS | Status: AC
  Administered 2019-02-25: 10 mL via INTRAVENOUS
  Filled 2019-02-25: qty 10

## 2019-02-25 MED ORDER — ACETAMINOPHEN 325 MG PO TABS: 650.0000 mg | ORAL_TABLET | Freq: Once | ORAL | Status: DC

## 2019-02-25 NOTE — Patient Instructions (Signed)
[facility] Discharge Instructions for Patients Receiving Chemotherapy  Today you received the following chemotherapy agents: Trastuzumab (Herceptin)  To help prevent nausea and vomiting after your treatment, we encourage you to take your nausea medication as directed.    If you develop nausea and vomiting that is not controlled by your nausea medication, call the clinic.   BELOW ARE SYMPTOMS THAT SHOULD BE REPORTED IMMEDIATELY:  *FEVER GREATER THAN 100.5 F  *CHILLS WITH OR WITHOUT FEVER  NAUSEA AND VOMITING THAT IS NOT CONTROLLED WITH YOUR NAUSEA MEDICATION  *UNUSUAL SHORTNESS OF BREATH  *UNUSUAL BRUISING OR BLEEDING  TENDERNESS IN MOUTH AND THROAT WITH OR WITHOUT PRESENCE OF ULCERS  *URINARY PROBLEMS  *BOWEL PROBLEMS  UNUSUAL RASH Items with * indicate a potential emergency and should be followed up as soon as possible.  Feel free to call the clinic should you have any questions or concerns. The clinic phone number is [phone].  Please show the CHEMO ALERT CARD at check-in to the Emergency Department and triage nurse.    

## 2019-02-25 NOTE — Telephone Encounter (Signed)
Scheduled appt per 7/15 los.  Patient declined calendar and avs.

## 2019-03-02 ENCOUNTER — Ambulatory Visit (HOSPITAL_BASED_OUTPATIENT_CLINIC_OR_DEPARTMENT_OTHER)
Admission: RE | Admit: 2019-03-02 | Discharge: 2019-03-02 | Disposition: A | Discharge status: Home / Self Care | Payer: BC Managed Care – PPO | Source: Ambulatory Visit | Attending: Internal Medicine | Admitting: Internal Medicine

## 2019-03-02 ENCOUNTER — Other Ambulatory Visit: Payer: Self-pay

## 2019-03-02 ENCOUNTER — Ambulatory Visit (HOSPITAL_COMMUNITY)
Admission: RE | Admit: 2019-03-02 | Discharge: 2019-03-02 | Disposition: A | Discharge status: Home / Self Care | Payer: BC Managed Care – PPO | Source: Ambulatory Visit | Attending: Internal Medicine | Admitting: Internal Medicine

## 2019-03-02 DIAGNOSIS — Z8042 Family history of malignant neoplasm of prostate: Secondary | ICD-10-CM | POA: Diagnosis not present

## 2019-03-02 DIAGNOSIS — Z791 Long term (current) use of non-steroidal anti-inflammatories (NSAID): Secondary | ICD-10-CM | POA: Insufficient documentation

## 2019-03-02 DIAGNOSIS — Z885 Allergy status to narcotic agent status: Secondary | ICD-10-CM | POA: Diagnosis not present

## 2019-03-02 DIAGNOSIS — Z886 Allergy status to analgesic agent status: Secondary | ICD-10-CM | POA: Diagnosis not present

## 2019-03-02 DIAGNOSIS — I340 Nonrheumatic mitral (valve) insufficiency: Secondary | ICD-10-CM | POA: Diagnosis not present

## 2019-03-02 DIAGNOSIS — C50911 Malignant neoplasm of unspecified site of right female breast: Secondary | ICD-10-CM | POA: Diagnosis not present

## 2019-03-02 DIAGNOSIS — Z803 Family history of malignant neoplasm of breast: Secondary | ICD-10-CM | POA: Insufficient documentation

## 2019-03-02 DIAGNOSIS — Z923 Personal history of irradiation: Secondary | ICD-10-CM | POA: Diagnosis not present

## 2019-03-02 DIAGNOSIS — I10 Essential (primary) hypertension: Secondary | ICD-10-CM | POA: Diagnosis not present

## 2019-03-02 DIAGNOSIS — Z79899 Other long term (current) drug therapy: Secondary | ICD-10-CM | POA: Diagnosis not present

## 2019-03-02 DIAGNOSIS — C50919 Malignant neoplasm of unspecified site of unspecified female breast: Secondary | ICD-10-CM

## 2019-03-02 DIAGNOSIS — Z9049 Acquired absence of other specified parts of digestive tract: Secondary | ICD-10-CM | POA: Insufficient documentation

## 2019-03-02 DIAGNOSIS — C7800 Secondary malignant neoplasm of unspecified lung: Secondary | ICD-10-CM | POA: Insufficient documentation

## 2019-03-02 DIAGNOSIS — Z800 Family history of malignant neoplasm of digestive organs: Secondary | ICD-10-CM | POA: Diagnosis not present

## 2019-03-02 DIAGNOSIS — Z888 Allergy status to other drugs, medicaments and biological substances status: Secondary | ICD-10-CM | POA: Diagnosis not present

## 2019-03-02 MED ORDER — SPIRONOLACTONE 25 MG PO TABS: 12.5000 mg | ORAL_TABLET | Freq: Every day | ORAL | 3 refills | Status: DC

## 2019-03-02 NOTE — Progress Notes (Signed)
Heart Failure TeleHealth Note  Due to national recommendations of social distancing due to COVID 19, Audio/video telehealth visit is felt to be most appropriate for this patient at this time.  See MyChart message from today for patient consent regarding telehealth for [facility].  Date:  03/02/2019   ID:  [name] [name], DOB [birth year], MRN [id number]  Location: Home  Provider location: [place] [facility] Type of Visit: Established patient  PCP:  [name] Radar, PA-C  Cardiologist:  No primary care provider on file. Primary HF: [name] [name]  Chief Complaint: Cardio-oncology follow-up   History of Present Illness:  [name] [name] is a 48 y/o woman with Stage IV breast CA with lung mets referred by [name] [name] for enrollment into the cardio-oncology clinic for surveillance while receiving Herceptin (started 04/12/15 and on indefinitiely) who presents via audio/video conferencing for a telehealth visit today.     Continues to receive herceptin for metastatic breast CA (has been receiving Herceptin since 8/16. 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 Feeling good. No SOB, orthopnea or PND. SBP remains 140-150.   Echo today (03/02/19) EF 55-60% GLS -18.2%   Echo 12/19 60-65% GLS -22.2% Personally reviewed  Echo 01/20/18. EF 55-60% LS' 10.4 cm/s GLS -20.7% Personally reviewed  Echo 06/20/17 EF 55-60% LS' not measured will  GLS -19.1  Echo 3/16 55-60% Lat s' 12.0 cm/sec GLS -17.5% Echo 2/17 55-60%  Lat s' 11.2 cm/sec GLS -17.2% Echo 01/18/16 EF 60-65% lat s' 7.9 cm/sec (out of plane) GLS -20.2% Echo 05/22/16 EF 60-65%  Lat s'11.2cm.sec GLS underestimated due to poor endocardial tracking Echo 5/18 EF 60% LS' 11.9 cm/s GLS -19.7   [name] [name] denies symptoms worrisome for COVID 19.   Past Medical History:  Diagnosis Date   Breast cancer (HCC)    Breast cancer (HCC)    GERD (gastroesophageal  reflux disease)    during pregnancy    Pneumonia    hx of pneumonia 08/2013    S/P radiation therapy 05/03/2015 through 06/14/2015    Right breast 4680 cGy in 26 sessions, right breast boost 1000 cGy in 5 sessions. Right supraclavicular/axillary region 4680 cGy with a supplemental PA field to bring the axillary dose up to 4500 cGy in 26 sessions   Past Surgical History:  Procedure Laterality Date   BREAST LUMPECTOMY WITH RADIOACTIVE SEED AND SENTINEL LYMPH NODE BIOPSY Right 03/29/2015   Procedure: BREAST LUMPECTOMY WITH RADIOACTIVE SEED AND SENTINEL LYMPH NODE BIOPSY;  Surgeon: [name] Lint, MD;  Location: [facility];  Service: General;  Laterality: Right;   CESAREAN SECTION     CHOLECYSTECTOMY     ESSURE TUBAL LIGATION     PORTACATH PLACEMENT Left 07/21/2014   Procedure: INSERTION PORT-A-CATH;  Surgeon: [name] Lint, MD;  Location: [facility];  Service: General;  Laterality: Left;   WISDOM TOOTH EXTRACTION       Current Outpatient Medications  Medication Sig Dispense Refill   acetaminophen (TYLENOL) 500 MG tablet Take 500 mg by mouth every 6 (six) hours as needed.     Calcium-Phosphorus-Vitamin D (CALCIUM GUMMIES PO) Take by mouth daily.     Camphor-Eucalyptus-Menthol (VICKS VAPORUB EX) Apply 1 application topically at bedtime. Applies under nose, throat and on chest.     cetirizine (ZYRTEC) 10 MG tablet Take 10 mg by mouth daily as needed for allergies (allergies).      fluticasone (FLONASE) 50 MCG/ACT  nasal spray Place into both nostrils daily.     hydrocortisone 2.5 % cream APPLY TOPICALLY TO THE AFFECTED AREA TWICE DAILY 454 g 0   ibuprofen (ADVIL,MOTRIN) 800 MG tablet Take 1 tablet (800 mg total) by mouth every 8 (eight) hours as needed. 60 tablet 1   lidocaine-prilocaine (EMLA) cream APPLY TOPICALLY TO THE AFFECTED AREA AS NEEDED 30 g 2   loperamide (IMODIUM A-D) 2 MG tablet Take 1 tablet (2 mg total) by  mouth 4 (four) times daily as needed for diarrhea or loose stools. 30 tablet 0   losartan (COZAAR) 50 MG tablet Take 1 tablet (50 mg total) by mouth daily. 90 tablet 3   meloxicam (MOBIC) 7.5 MG tablet Take by mouth.     metaxalone (SKELAXIN) 800 MG tablet Take 1 tablet (800 mg total) by mouth 3 (three) times daily as needed for muscle spasms. 60 tablet 1   Multiple Vitamins-Minerals (AIRBORNE PO) Take 1 tablet by mouth daily.     Multiple Vitamins-Minerals (MULTIVITAMIN GUMMIES ADULT PO) Take 1 each by mouth daily. Women's Vitafusion gummie     triamcinolone (KENALOG) 0.025 % ointment Apply 1 application topically 2 (two) times daily. 30 g 1   No current facility-administered medications for this encounter.    Facility-Administered Medications Ordered in Other Encounters  Medication Dose Route Frequency Provider Last Rate Last Dose   acetaminophen (TYLENOL) tablet 650 mg  650 mg Oral Once [name] Mood, MD       sodium chloride 0.9 % injection 10 mL  10 mL Intracatheter PRN [name] Mood, MD   10 mL at 05/01/16 1622   sodium chloride 0.9 % injection 10 mL  10 mL Intravenous PRN [name] Mood, MD        Allergies:   Adhesive [tape], Aspirin, and Codeine   Social History:  The patient  reports that she has never smoked. She has never used smokeless tobacco. She reports current alcohol use. She reports that she does not use drugs.   Family History:  The patient's family history includes Breast cancer (age of onset: 76) in her maternal aunt; Breast cancer (age of onset: 58) in her mother; Breast cancer (age of onset: 3) in her cousin; Breast cancer (age of onset: 64) in her maternal aunt; Liver cancer in her father and paternal grandmother; Prostate cancer in her paternal grandfather; Prostate cancer (age of onset: 40) in her maternal uncle; Prostate cancer (age of onset: 37) in her maternal uncle; Prostate cancer (age of onset: 21) in her maternal grandfather.   ROS:  Please see the history of  present illness.   All other systems are personally reviewed and negative.   Exam:  (Video/Tele Health Call; Exam is subjective and or/visual.) General:  Speaks in full sentences. No resp difficulty. Lungs: Normal respiratory effort with conversation.  Abdomen: Non-distended per patient report Extremities: Pt denies edema. Neuro: Alert & oriented x 3.   Recent Labs: 02/23/2019: ALT 26; BUN 14; Creatinine, Ser 0.96; Hemoglobin 11.7; Platelets 222; Potassium 4.1; Sodium 140  Personally reviewed   Wt Readings from Last 3 Encounters:  02/25/19 105 kg (231 lb 6.4 oz)  12/03/18 102.7 kg (226 lb 6.4 oz)  10/28/18 103.9 kg (229 lb)      ASSESSMENT AND PLAN:  1. R breast invasive ductal carcinoma, T3N1M1, stage IV, ER-/PR-/HER2+, with metastases to b/l lungs, biopsy confirmed - I reviewed echos personally. EF and Doppler parameters stable. No HF on exam. Continue Herceptin.  - repeat echos in 6  months  2. HTN - Remains elevated with SBP in 140-150s.  - Continue losartan 50 - Add spiro 12.5 daily. Check BMET in 2 weeks with Herceptin  Arvilla Meres MD 07/22/2018       COVID screen The patient does not have any symptoms that suggest any further testing/ screening at this time.  Social distancing reinforced today.  Recommended follow-up:  As above  Relevant cardiac medications were reviewed at length with the patient today.   The patient does not have concerns regarding their medications at this time.   The following changes were made today:  As above  Today, I have spent 19 minutes with the patient with telehealth technology discussing the above issues .    Signed, Arvilla Meres, MD  03/02/2019 3:33 PM  [facility] [address] Heart and Vascular [place] [address] [phone] (office) [phone] (fax)

## 2019-03-02 NOTE — Progress Notes (Signed)
  Echocardiogram 2D Echocardiogram has been performed.  Johny Chess 03/02/2019, 9:08 AM

## 2019-03-02 NOTE — Addendum Note (Signed)
Encounter addended by: Marlise Eves, RN on: 03/02/2019 4:33 PM  Actions taken: Order list changed, Diagnosis association updated, Clinical Note Signed

## 2019-03-02 NOTE — Progress Notes (Addendum)
1. Start spiro 12.5 daily. Please give 90-day supply  2. BMET in 2 weeks at Cancer center  3. F/u in 57months with echo    Called pt. No answer. Left voicemail. Sent to scheduler.

## 2019-03-02 NOTE — Patient Instructions (Addendum)
START Spironolactone 12.5mg . 0.5 tab daily.  Lab work will need to drawn at the [facility] in 2 weeks.  Your physician has requested that you have an echocardiogram. Echocardiography is a painless test that uses sound waves to create images of your heart. It provides your doctor with information about the size and shape of your heart and how well your heart's chambers and valves are working. This procedure takes approximately one hour. There are no restrictions for this procedure. This will be done at your follow up with Dr. Haroldine Laws.  Please follow up with the [facility] in 6 months with an echocardiogram  At the [facility], you and your health needs are our priority. As part of our continuing mission to provide you with exceptional heart care, we have created designated Provider Care Teams. These Care Teams include your primary Cardiologist (physician) and Advanced Practice Providers (APPs- Physician Assistants and Nurse Practitioners) who all work together to provide you with the care you need, when you need it.   You may see any of the following providers on your designated Care Team at your next follow up: Marland Kitchen Dr Glori Bickers . Dr Loralie Champagne . Darrick Grinder, NP

## 2019-03-03 NOTE — Addendum Note (Signed)
Encounter addended by: Marlise Eves, RN on: 03/03/2019 8:50 AM  Actions taken: Clinical Note Signed

## 2019-03-16 ENCOUNTER — Other Ambulatory Visit: Payer: Self-pay | Admitting: Hematology

## 2019-03-16 NOTE — Progress Notes (Signed)
The following biosimilar Ogivri (trastuzumab-dkst) has been selected for use in this patient.  Kennith Center, Pharm.D., CPP 03/16/2019@1 :44 PM

## 2019-03-18 ENCOUNTER — Inpatient Hospital Stay: Payer: BC Managed Care – PPO | Attending: Hematology

## 2019-03-18 ENCOUNTER — Other Ambulatory Visit: Payer: Self-pay

## 2019-03-18 ENCOUNTER — Inpatient Hospital Stay: Payer: BC Managed Care – PPO

## 2019-03-18 VITALS — BP 163/68 | HR 66 | Temp 99.0°F | Resp 18

## 2019-03-18 DIAGNOSIS — C50911 Malignant neoplasm of unspecified site of right female breast: Secondary | ICD-10-CM

## 2019-03-18 DIAGNOSIS — C7800 Secondary malignant neoplasm of unspecified lung: Secondary | ICD-10-CM

## 2019-03-18 DIAGNOSIS — C50311 Malignant neoplasm of lower-inner quadrant of right female breast: Secondary | ICD-10-CM | POA: Insufficient documentation

## 2019-03-18 DIAGNOSIS — Z5112 Encounter for antineoplastic immunotherapy: Secondary | ICD-10-CM | POA: Insufficient documentation

## 2019-03-18 LAB — CBC WITH DIFFERENTIAL/PLATELET
Abs Immature Granulocytes: 0.01 10*3/uL (ref 0.00–0.07)
Basophils Absolute: 0 10*3/uL (ref 0.0–0.1)
Basophils Relative: 0 %
Eosinophils Absolute: 0.1 10*3/uL (ref 0.0–0.5)
Eosinophils Relative: 1 %
HCT: 34 % — ABNORMAL LOW (ref 36.0–46.0)
Hemoglobin: 11.1 g/dL — ABNORMAL LOW (ref 12.0–15.0)
Immature Granulocytes: 0 %
Lymphocytes Relative: 28 %
Lymphs Abs: 1.7 10*3/uL (ref 0.7–4.0)
MCH: 29.6 pg (ref 26.0–34.0)
MCHC: 32.6 g/dL (ref 30.0–36.0)
MCV: 90.7 fL (ref 80.0–100.0)
Monocytes Absolute: 0.5 10*3/uL (ref 0.1–1.0)
Monocytes Relative: 8 %
Neutro Abs: 3.7 10*3/uL (ref 1.7–7.7)
Neutrophils Relative %: 63 %
Platelets: 203 10*3/uL (ref 150–400)
RBC: 3.75 MIL/uL — ABNORMAL LOW (ref 3.87–5.11)
RDW: 13.2 % (ref 11.5–15.5)
WBC: 6 10*3/uL (ref 4.0–10.5)
nRBC: 0 % (ref 0.0–0.2)

## 2019-03-18 LAB — COMPREHENSIVE METABOLIC PANEL
ALT: 30 U/L (ref 0–44)
AST: 25 U/L (ref 15–41)
Albumin: 3.7 g/dL (ref 3.5–5.0)
Alkaline Phosphatase: 70 U/L (ref 38–126)
Anion gap: 11 (ref 5–15)
BUN: 14 mg/dL (ref 6–20)
CO2: 25 mmol/L (ref 22–32)
Calcium: 9.2 mg/dL (ref 8.9–10.3)
Chloride: 106 mmol/L (ref 98–111)
Creatinine, Ser: 0.92 mg/dL (ref 0.44–1.00)
GFR calc Af Amer: >60 mL/min (ref >60)
GFR calc non Af Amer: >60 mL/min (ref >60)
Glucose, Bld: 101 mg/dL — ABNORMAL HIGH (ref 70–99)
Potassium: 3.6 mmol/L (ref 3.5–5.1)
Sodium: 142 mmol/L (ref 135–145)
Total Bilirubin: 0.3 mg/dL (ref 0.3–1.2)
Total Protein: 7.4 g/dL (ref 6.5–8.1)

## 2019-03-18 MED ORDER — SODIUM CHLORIDE 0.9 % IV SOLN
Freq: Once | INTRAVENOUS | Status: AC
  Administered 2019-03-18: 16:00:00 via INTRAVENOUS
  Filled 2019-03-18: qty 250

## 2019-03-18 MED ORDER — TRASTUZUMAB CHEMO 150 MG IV SOLR
600.0000 mg | Freq: Once | INTRAVENOUS | Status: AC
  Administered 2019-03-18: 600 mg via INTRAVENOUS
  Filled 2019-03-18: qty 28.57

## 2019-03-18 MED ORDER — HEPARIN SOD (PORK) LOCK FLUSH 100 UNIT/ML IV SOLN
500.0000 [IU] | Freq: Once | INTRAVENOUS | Status: AC | PRN
  Administered 2019-03-18: 500 [IU]
  Filled 2019-03-18: qty 5

## 2019-03-18 MED ORDER — SODIUM CHLORIDE 0.9 % IJ SOLN
10.0000 mL | INTRAMUSCULAR | Status: DC | PRN
  Administered 2019-03-18: 10 mL
  Filled 2019-03-18: qty 10

## 2019-03-18 NOTE — Patient Instructions (Signed)
[facility] Discharge Instructions for Patients Receiving Chemotherapy  Today you received the following chemotherapy agents: Trastuzumab (Herceptin)  To help prevent nausea and vomiting after your treatment, we encourage you to take your nausea medication as directed.    If you develop nausea and vomiting that is not controlled by your nausea medication, call the clinic.   BELOW ARE SYMPTOMS THAT SHOULD BE REPORTED IMMEDIATELY:  *FEVER GREATER THAN 100.5 F  *CHILLS WITH OR WITHOUT FEVER  NAUSEA AND VOMITING THAT IS NOT CONTROLLED WITH YOUR NAUSEA MEDICATION  *UNUSUAL SHORTNESS OF BREATH  *UNUSUAL BRUISING OR BLEEDING  TENDERNESS IN MOUTH AND THROAT WITH OR WITHOUT PRESENCE OF ULCERS  *URINARY PROBLEMS  *BOWEL PROBLEMS  UNUSUAL RASH Items with * indicate a potential emergency and should be followed up as soon as possible.  Feel free to call the clinic should you have any questions or concerns. The clinic phone number is [phone].  Please show the CHEMO ALERT CARD at check-in to the Emergency Department and triage nurse.    

## 2019-04-08 ENCOUNTER — Inpatient Hospital Stay: Payer: BC Managed Care – PPO

## 2019-04-08 ENCOUNTER — Other Ambulatory Visit: Payer: Self-pay

## 2019-04-08 VITALS — BP 153/88 | HR 79 | Temp 99.1°F | Resp 18 | Ht 70.0 in

## 2019-04-08 DIAGNOSIS — C50911 Malignant neoplasm of unspecified site of right female breast: Secondary | ICD-10-CM

## 2019-04-08 DIAGNOSIS — Z5112 Encounter for antineoplastic immunotherapy: Secondary | ICD-10-CM | POA: Diagnosis not present

## 2019-04-08 DIAGNOSIS — C7800 Secondary malignant neoplasm of unspecified lung: Secondary | ICD-10-CM

## 2019-04-08 MED ORDER — SODIUM CHLORIDE 0.9 % IV SOLN
Freq: Once | INTRAVENOUS | Status: AC
  Administered 2019-04-08: 16:00:00 via INTRAVENOUS
  Filled 2019-04-08: qty 250

## 2019-04-08 MED ORDER — ACETAMINOPHEN 325 MG PO TABS: 650.0000 mg | ORAL_TABLET | Freq: Once | ORAL | Status: DC

## 2019-04-08 MED ORDER — HEPARIN SOD (PORK) LOCK FLUSH 100 UNIT/ML IV SOLN
500.0000 [IU] | Freq: Once | INTRAVENOUS | Status: AC | PRN
  Administered 2019-04-08: 17:00:00 500 [IU]
  Filled 2019-04-08: qty 5

## 2019-04-08 MED ORDER — TRASTUZUMAB-DKST CHEMO 150 MG IV SOLR
600.0000 mg | Freq: Once | INTRAVENOUS | Status: AC
  Administered 2019-04-08: 17:00:00 600 mg via INTRAVENOUS
  Filled 2019-04-08: qty 28.57

## 2019-04-08 MED ORDER — SODIUM CHLORIDE 0.9% FLUSH
10.0000 mL | INTRAVENOUS | Status: DC | PRN
  Administered 2019-04-08: 10 mL
  Filled 2019-04-08: qty 10

## 2019-04-08 NOTE — Patient Instructions (Signed)
[facility] Discharge Instructions for Patients Receiving Chemotherapy  Today you received the following chemotherapy agents herceptin/ Ogivri   To help prevent nausea and vomiting after your treatment, we encourage you to take your nausea medication as directed.     If you develop nausea and vomiting that is not controlled by your nausea medication, call the clinic.   BELOW ARE SYMPTOMS THAT SHOULD BE REPORTED IMMEDIATELY:  *FEVER GREATER THAN 100.5 F  *CHILLS WITH OR WITHOUT FEVER  NAUSEA AND VOMITING THAT IS NOT CONTROLLED WITH YOUR NAUSEA MEDICATION  *UNUSUAL SHORTNESS OF BREATH  *UNUSUAL BRUISING OR BLEEDING  TENDERNESS IN MOUTH AND THROAT WITH OR WITHOUT PRESENCE OF ULCERS  *URINARY PROBLEMS  *BOWEL PROBLEMS  UNUSUAL RASH Items with * indicate a potential emergency and should be followed up as soon as possible.  Feel free to call the clinic you have any questions or concerns. The clinic phone number is [phone].

## 2019-04-24 ENCOUNTER — Telehealth (HOSPITAL_COMMUNITY): Payer: Self-pay | Admitting: Cardiology

## 2019-04-24 DIAGNOSIS — I10 Essential (primary) hypertension: Secondary | ICD-10-CM

## 2019-04-24 MED ORDER — SPIRONOLACTONE 25 MG PO TABS: 25.0000 mg | ORAL_TABLET | Freq: Every day | ORAL | 3 refills | Status: DC

## 2019-04-24 NOTE — Addendum Note (Signed)
Addended by: Lindley Magnus A on: 04/24/2019 04:00 PM   Modules accepted: Orders

## 2019-04-24 NOTE — Telephone Encounter (Signed)
Please set up for BMET in 7 days.

## 2019-04-24 NOTE — Telephone Encounter (Signed)
Called pt. Let her know to continue spiro 25mg  daily and to make a lab appointment. Pt to have labs drawn at the cancer center next appointment. Pt verbalized understanding.

## 2019-04-24 NOTE — Telephone Encounter (Signed)
Patient called triage to  Report she has been taking spiro 25 as opposed to 12.5 mg,as ordered therefore she has run out.  Reports SBP readings are still 150's to 160's   Ok to continue 25 daily

## 2019-04-29 ENCOUNTER — Other Ambulatory Visit: Payer: Self-pay

## 2019-04-29 ENCOUNTER — Inpatient Hospital Stay: Payer: BC Managed Care – PPO | Attending: Hematology

## 2019-04-29 ENCOUNTER — Inpatient Hospital Stay: Payer: BC Managed Care – PPO

## 2019-04-29 ENCOUNTER — Other Ambulatory Visit: Payer: Self-pay | Admitting: Hematology

## 2019-04-29 VITALS — BP 171/73 | HR 75 | Temp 99.1°F | Resp 17 | Wt 231.8 lb

## 2019-04-29 DIAGNOSIS — Z5112 Encounter for antineoplastic immunotherapy: Secondary | ICD-10-CM | POA: Diagnosis not present

## 2019-04-29 DIAGNOSIS — C50311 Malignant neoplasm of lower-inner quadrant of right female breast: Secondary | ICD-10-CM | POA: Diagnosis present

## 2019-04-29 DIAGNOSIS — C50911 Malignant neoplasm of unspecified site of right female breast: Secondary | ICD-10-CM

## 2019-04-29 DIAGNOSIS — Z95828 Presence of other vascular implants and grafts: Secondary | ICD-10-CM

## 2019-04-29 LAB — CBC WITH DIFFERENTIAL/PLATELET
Abs Immature Granulocytes: 0.01 10*3/uL (ref 0.00–0.07)
Basophils Absolute: 0 10*3/uL (ref 0.0–0.1)
Basophils Relative: 0 %
Eosinophils Absolute: 0.1 10*3/uL (ref 0.0–0.5)
Eosinophils Relative: 1 %
HCT: 34.8 % — ABNORMAL LOW (ref 36.0–46.0)
Hemoglobin: 11.5 g/dL — ABNORMAL LOW (ref 12.0–15.0)
Immature Granulocytes: 0 %
Lymphocytes Relative: 27 %
Lymphs Abs: 1.9 10*3/uL (ref 0.7–4.0)
MCH: 29.8 pg (ref 26.0–34.0)
MCHC: 33 g/dL (ref 30.0–36.0)
MCV: 90.2 fL (ref 80.0–100.0)
Monocytes Absolute: 0.6 10*3/uL (ref 0.1–1.0)
Monocytes Relative: 8 %
Neutro Abs: 4.3 10*3/uL (ref 1.7–7.7)
Neutrophils Relative %: 64 %
Platelets: 224 10*3/uL (ref 150–400)
RBC: 3.86 MIL/uL — ABNORMAL LOW (ref 3.87–5.11)
RDW: 13.3 % (ref 11.5–15.5)
WBC: 6.8 10*3/uL (ref 4.0–10.5)
nRBC: 0 % (ref 0.0–0.2)

## 2019-04-29 LAB — COMPREHENSIVE METABOLIC PANEL
ALT: 18 U/L (ref 0–44)
AST: 15 U/L (ref 15–41)
Albumin: 3.6 g/dL (ref 3.5–5.0)
Alkaline Phosphatase: 74 U/L (ref 38–126)
Anion gap: 8 (ref 5–15)
BUN: 11 mg/dL (ref 6–20)
CO2: 27 mmol/L (ref 22–32)
Calcium: 8.9 mg/dL (ref 8.9–10.3)
Chloride: 105 mmol/L (ref 98–111)
Creatinine, Ser: 0.9 mg/dL (ref 0.44–1.00)
GFR calc Af Amer: >60 mL/min (ref >60)
GFR calc non Af Amer: >60 mL/min (ref >60)
Glucose, Bld: 112 mg/dL — ABNORMAL HIGH (ref 70–99)
Potassium: 3.8 mmol/L (ref 3.5–5.1)
Sodium: 140 mmol/L (ref 135–145)
Total Bilirubin: 0.3 mg/dL (ref 0.3–1.2)
Total Protein: 7.4 g/dL (ref 6.5–8.1)

## 2019-04-29 MED ORDER — SODIUM CHLORIDE 0.9 % IV SOLN
Freq: Once | INTRAVENOUS | Status: AC
  Administered 2019-04-29: 16:00:00 via INTRAVENOUS
  Filled 2019-04-29: qty 250

## 2019-04-29 MED ORDER — TRASTUZUMAB-DKST CHEMO 150 MG IV SOLR
600.0000 mg | Freq: Once | INTRAVENOUS | Status: AC
  Administered 2019-04-29: 17:00:00 600 mg via INTRAVENOUS
  Filled 2019-04-29: qty 28.57

## 2019-04-29 MED ORDER — SODIUM CHLORIDE 0.9% FLUSH
10.0000 mL | Freq: Once | INTRAVENOUS | Status: AC
  Administered 2019-04-29: 10 mL
  Filled 2019-04-29: qty 10

## 2019-04-29 MED ORDER — SODIUM CHLORIDE 0.9 % IJ SOLN
10.0000 mL | INTRAMUSCULAR | Status: DC | PRN
  Filled 2019-04-29: qty 10

## 2019-04-29 MED ORDER — ACETAMINOPHEN 325 MG PO TABS: 650.0000 mg | ORAL_TABLET | Freq: Once | ORAL | Status: DC

## 2019-04-29 MED ORDER — HEPARIN SOD (PORK) LOCK FLUSH 100 UNIT/ML IV SOLN
500.0000 [IU] | Freq: Once | INTRAVENOUS | Status: AC | PRN
  Administered 2019-04-29: 500 [IU]
  Filled 2019-04-29: qty 5

## 2019-04-30 ENCOUNTER — Other Ambulatory Visit: Payer: Self-pay | Admitting: Hematology

## 2019-04-30 DIAGNOSIS — Z171 Estrogen receptor negative status [ER-]: Secondary | ICD-10-CM

## 2019-04-30 DIAGNOSIS — C50511 Malignant neoplasm of lower-outer quadrant of right female breast: Secondary | ICD-10-CM

## 2019-05-20 ENCOUNTER — Inpatient Hospital Stay: Payer: BC Managed Care – PPO | Attending: Hematology

## 2019-05-20 ENCOUNTER — Other Ambulatory Visit: Payer: Self-pay

## 2019-05-20 VITALS — BP 134/92 | HR 79 | Temp 99.1°F | Resp 17

## 2019-05-20 DIAGNOSIS — I10 Essential (primary) hypertension: Secondary | ICD-10-CM | POA: Insufficient documentation

## 2019-05-20 DIAGNOSIS — E669 Obesity, unspecified: Secondary | ICD-10-CM | POA: Diagnosis not present

## 2019-05-20 DIAGNOSIS — C7800 Secondary malignant neoplasm of unspecified lung: Secondary | ICD-10-CM | POA: Insufficient documentation

## 2019-05-20 DIAGNOSIS — Z171 Estrogen receptor negative status [ER-]: Secondary | ICD-10-CM | POA: Diagnosis not present

## 2019-05-20 DIAGNOSIS — Z23 Encounter for immunization: Secondary | ICD-10-CM | POA: Diagnosis not present

## 2019-05-20 DIAGNOSIS — C50311 Malignant neoplasm of lower-inner quadrant of right female breast: Secondary | ICD-10-CM | POA: Diagnosis present

## 2019-05-20 DIAGNOSIS — C50911 Malignant neoplasm of unspecified site of right female breast: Secondary | ICD-10-CM

## 2019-05-20 DIAGNOSIS — Z5112 Encounter for antineoplastic immunotherapy: Secondary | ICD-10-CM | POA: Diagnosis present

## 2019-05-20 MED ORDER — ACETAMINOPHEN 325 MG PO TABS: 650.0000 mg | ORAL_TABLET | Freq: Once | ORAL | Status: DC

## 2019-05-20 MED ORDER — TRASTUZUMAB-DKST CHEMO 150 MG IV SOLR
600.0000 mg | Freq: Once | INTRAVENOUS | Status: AC
  Administered 2019-05-20: 16:00:00 600 mg via INTRAVENOUS
  Filled 2019-05-20: qty 28.57

## 2019-05-20 MED ORDER — HEPARIN SOD (PORK) LOCK FLUSH 100 UNIT/ML IV SOLN
500.0000 [IU] | Freq: Once | INTRAVENOUS | Status: AC | PRN
  Administered 2019-05-20: 17:00:00 500 [IU]
  Filled 2019-05-20: qty 5

## 2019-05-20 MED ORDER — SODIUM CHLORIDE 0.9% FLUSH
10.0000 mL | Freq: Once | INTRAVENOUS | Status: AC
  Administered 2019-05-20: 17:00:00 10 mL via INTRAVENOUS
  Filled 2019-05-20: qty 10

## 2019-05-20 MED ORDER — SODIUM CHLORIDE 0.9 % IV SOLN
Freq: Once | INTRAVENOUS | Status: AC
  Administered 2019-05-20: 16:00:00 via INTRAVENOUS
  Filled 2019-05-20: qty 250

## 2019-05-20 MED ORDER — SODIUM CHLORIDE 0.9 % IJ SOLN: 10.0000 mL | INTRAMUSCULAR | Status: DC | PRN

## 2019-05-20 NOTE — Patient Instructions (Signed)
[facility] Discharge Instructions for Patients Receiving Chemotherapy  Today you received the following chemotherapy agents herceptin/ Ogivri   To help prevent nausea and vomiting after your treatment, we encourage you to take your nausea medication as directed.     If you develop nausea and vomiting that is not controlled by your nausea medication, call the clinic.   BELOW ARE SYMPTOMS THAT SHOULD BE REPORTED IMMEDIATELY:  *FEVER GREATER THAN 100.5 F  *CHILLS WITH OR WITHOUT FEVER  NAUSEA AND VOMITING THAT IS NOT CONTROLLED WITH YOUR NAUSEA MEDICATION  *UNUSUAL SHORTNESS OF BREATH  *UNUSUAL BRUISING OR BLEEDING  TENDERNESS IN MOUTH AND THROAT WITH OR WITHOUT PRESENCE OF ULCERS  *URINARY PROBLEMS  *BOWEL PROBLEMS  UNUSUAL RASH Items with * indicate a potential emergency and should be followed up as soon as possible.  Feel free to call the clinic you have any questions or concerns. The clinic phone number is [phone].

## 2019-06-01 NOTE — Progress Notes (Signed)
[place]   Telephone:(336) [phone] Fax:(336) [phone]   Clinic Follow up Note   Patient Care Team: Virginia Rochester, [place] as PCP - General (Family Medicine) [name], DO (Family Medicine) Holley Bouche, NP (Inactive) as Nurse Practitioner (Nurse Practitioner) Stark Klein, MD as Consulting Physician (General Surgery) Arloa Koh, MD (Inactive) as Consulting Physician (Radiation Oncology) [name], MD as Consulting Physician (Hematology) Clydie Braun, MD (Obstetrics and Gynecology)  Date of Service:  06/10/2019  CHIEF COMPLAINT: F/u of right breast cancer  SUMMARY OF ONCOLOGIC HISTORY: Oncology History Overview Note  Breast cancer 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   Staging form: Breast, AJCC 7th Edition     Clinical stage from 07/22/2014: Stage IV (T3, N1, M1) - Unsigned     Breast cancer 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 ([place])  07/02/2014 Mammogram   Mammogram showed a 2cm right beast mass and a 1.8cm right axillary node. MRI breast on 07/16/2014 showed 7cm R breast lesion and 4.4cm r axillary node    07/02/2014 Imaging   CT CAP: a 4.7cm mass in LUL lung and a 2.1cm mas in RML, and a small nodule in RUL, suspecious for metastasis     07/09/2014 Initial Diagnosis   right IDA with b/l lung lesions, ER-/PR-/HER2+   07/09/2014 Initial Biopsy   US guided right breast mass and axillary node biopsy showed IDA, and DCIS, ER-/PR-/HER2+   07/26/2014 Pathologic Stage   Left lung mass by IR, path revealed high grade carcinoma, morphology similar to breast tumor biopsy, TTF(-), NapsinA(-), ER(-)   08/04/2014 - 03/22/2015 Chemotherapy   weekly Paclitaxel 41m/m2, trastuzumab and pertuzumab every 3 weeks   08/30/2014 Genetic Testing   BreastNext panel was negative. 17 genes including BRCA1, BRCA2, were negative for mutations.    10/04/2014 Imaging   Interval decrease in the right axillary lymphadenopathy. Bilateral pulmonary lesions with left hilar lymphadenopathy also  markedly decreased in the interval. The left hilar lymphadenopathy has resolved.   12/20/2014 Imaging   restaging CT showed stable disease, no new lesions    03/29/2015 Pathology Results    right breast lumpectomy showed  chemotherapy treatment effect,  a 1 mm residual tumor,   margins were widely negative, 5 sentinel lymph nodes and 2 axillary lymph nodes were negative.    03/29/2015 Surgery    right breast lumpectomy and sentinel lymph node biopsy  by Dr. BBarry Dienes  04/12/2015 -  Chemotherapy    Herceptin maintenance therapy , 6 mg/kg, every 3 weeks   05/03/2015 - 06/14/2015 Radiation Therapy   right breast adjuvant irradiation by Dr. MValere Dross   08/28/2016 Imaging   CT CAP w Contrast  IMPRESSION: 1. No acute process or evidence of metastatic disease within the chest, abdomen, or pelvis. 2. Similar to less well-defined left upper lobe density, likely scarring. No evidence of new or progressive pulmonary metastasis. 3. Right nephrolithiasis.   01/22/2017 Imaging   CT Chest W Contrast 01/22/17 IMPRESSION: 1. Stable exam. No new or progressive findings. No evidence for metastatic disease. 2. Left upper lobe architectural distortion/scarring is stable. 3. Nonobstructing right renal stone.   07/31/2017 Imaging   Bone Scan Whole Body 07/31/17  IMPRESSION: 1. No scintigraphic evidence of osseous metastatic disease. 2. Thoracolumbar scoliosis.    07/31/2017 Imaging   CT CAP W Contrast 07/31/17 IMPRESSION: 1. No findings of active or recurrent malignancy. 2. Other imaging findings of potential clinical significance: Aortic Atherosclerosis (ICD10-I70.0). Mild cardiomegaly. Postoperative and radiation therapy findings in the right chest. Nonobstructive right nephrolithiasis.  Thoracolumbar scoliosis.    01/20/2018 Echocardiogram   ECHO 01/20/2018 Study Conclusions  - Left ventricle: The cavity size was normal. Wall thickness was   normal. Systolic function was normal. The estimated  ejection   fraction was in the range of 55% to 60%. Wall motion was normal;   there were no regional wall motion abnormalities. Doppler   parameters are consistent with abnormal left ventricular   relaxation (grade 1 diastolic dysfunction). - Impressions: GLS -20.7% LS&' 10.4 cm.    02/17/2018 Imaging   02/17/2018 CT CAP IMPRESSION: 1. No evidence for residual or recurrent tumor or metastatic disease. 2.  Aortic Atherosclerosis (ICD10-I70.0). 3. Right renal calculi. 4. Scoliosis.   08/11/2018 Imaging   CT CAP W Contrast 08/11/18  IMPRESSION: No evidence for localized recurrence or metastatic disease in the chest, abdomen or pelvis.   02/23/2019 Imaging   CT CAP W Contrast  IMPRESSION: 1. No findings of active malignancy in the chest, abdomen, or pelvis. 2. 3 mm right kidney lower pole nonobstructive renal calculus. 3. Thoracolumbar scoliosis.      CURRENT THERAPY:  MaintenanceHerceptin every 3 weeks since 04/12/15. Switched to Herceptin Hylecta injection q3weeks after 06/10/19.   INTERVAL HISTORY:  [name] [name] is here for a follow up and ongoing treatment. She presents to the clinic alone. She has had irregular periods since her chemo for breast cancer ended in 2016. She did have uterine polyps removed in early 2020. After 4 months of no periods, she had 1 episode of vaginal spotting which started and ended with severe cramping. This has not recurred since. She denies change in eating, bowel habits, dysuria.    REVIEW OF SYSTEMS:   Constitutional: Denies fevers, chills or abnormal weight loss Eyes: Denies blurriness of vision Ears, nose, mouth, throat, and face: Denies mucositis or sore throat Respiratory: Denies cough, dyspnea or wheezes Cardiovascular: Denies palpitation, chest discomfort or lower extremity swelling Gastrointestinal:  Denies nausea, heartburn or change in bowel habits Skin: Denies abnormal skin rashes Lymphatics: Denies new lymphadenopathy or easy  bruising Neurological:Denies numbness, tingling or new weaknesses Behavioral/Psych: Mood is stable, no new changes  All other systems were reviewed with the patient and are negative.  MEDICAL HISTORY:  Past Medical History:  Diagnosis Date  . Breast cancer ([place])   . Breast cancer ([place])   . GERD (gastroesophageal reflux disease)    during pregnancy   . Pneumonia    hx of pneumonia 08/2013   . S/P radiation therapy 05/03/2015 through 06/14/2015    Right breast 4680 cGy in 26 sessions, right breast boost 1000 cGy in 5 sessions. Right supraclavicular/axillary region 4680 cGy with a supplemental PA field to bring the axillary dose up to 4500 cGy in 26 sessions    SURGICAL HISTORY: Past Surgical History:  Procedure Laterality Date  . BREAST LUMPECTOMY WITH RADIOACTIVE SEED AND SENTINEL LYMPH NODE BIOPSY Right 03/29/2015   Procedure: BREAST LUMPECTOMY WITH RADIOACTIVE SEED AND SENTINEL LYMPH NODE BIOPSY;  Surgeon: Stark Klein, MD;  Location: [facility];  Service: General;  Laterality: Right;  . CESAREAN SECTION    . CHOLECYSTECTOMY    . ESSURE TUBAL LIGATION    . PORTACATH PLACEMENT Left 07/21/2014   Procedure: INSERTION PORT-A-CATH;  Surgeon: Stark Klein, MD;  Location: [facility];  Service: General;  Laterality: Left;  . WISDOM TOOTH EXTRACTION      I have reviewed the social history and family history with the patient and they are unchanged from previous note.  ALLERGIES:  is allergic to adhesive [tape]; aspirin; and codeine.  MEDICATIONS:  Current Outpatient Medications  Medication Sig Dispense Refill  . acetaminophen (TYLENOL) 500 MG tablet Take 500 mg by mouth every 6 (six) hours as needed.    . Calcium-Phosphorus-Vitamin D (CALCIUM GUMMIES PO) Take by mouth daily.    . Camphor-Eucalyptus-Menthol (VICKS VAPORUB EX) Apply 1 application topically at bedtime. Applies under nose, throat and on chest.    . cetirizine  (ZYRTEC) 10 MG tablet Take 10 mg by mouth daily as needed for allergies (allergies).     . fluticasone (FLONASE) 50 MCG/ACT nasal spray Place into both nostrils daily.    . hydrocortisone 2.5 % cream APPLY TOPICALLY TO THE AFFECTED AREA TWICE DAILY 454 g 0  . ibuprofen (ADVIL,MOTRIN) 800 MG tablet Take 1 tablet (800 mg total) by mouth every 8 (eight) hours as needed. 60 tablet 1  . lidocaine-prilocaine (EMLA) cream APPLY TOPICALLY TO THE AFFECTED AREA AS NEEDED 30 g 2  . loperamide (IMODIUM A-D) 2 MG tablet Take 1 tablet (2 mg total) by mouth 4 (four) times daily as needed for diarrhea or loose stools. 30 tablet 0  . metaxalone (SKELAXIN) 800 MG tablet Take 1 tablet (800 mg total) by mouth 3 (three) times daily as needed for muscle spasms. 60 tablet 1  . Multiple Vitamins-Minerals (AIRBORNE PO) Take 1 tablet by mouth daily.    . Multiple Vitamins-Minerals (MULTIVITAMIN GUMMIES ADULT PO) Take 1 each by mouth daily. Women's Vitafusion gummie    . spironolactone (ALDACTONE) 25 MG tablet Take 1 tablet (25 mg total) by mouth daily. 90 tablet 3  . triamcinolone (KENALOG) 0.025 % ointment Apply 1 application topically 2 (two) times daily. 30 g 1  . meloxicam (MOBIC) 7.5 MG tablet Take by mouth.     No current facility-administered medications for this visit.    Facility-Administered Medications Ordered in Other Visits  Medication Dose Route Frequency Provider Last Rate Last Dose  . acetaminophen (TYLENOL) tablet 650 mg  650 mg Oral Once [name], MD      . sodium chloride 0.9 % injection 10 mL  10 mL Intracatheter PRN [name], MD   10 mL at 05/01/16 1622  . sodium chloride 0.9 % injection 10 mL  10 mL Intravenous PRN [name], MD      . sodium chloride 0.9 % injection 10 mL  10 mL Intracatheter PRN [name], MD   10 mL at 03/18/19 1715    PHYSICAL EXAMINATION: ECOG PERFORMANCE STATUS: 0 - Asymptomatic  Vitals:   06/10/19 1429  BP: 139/83  Pulse: 91  Resp: 18  Temp: 98.3 F (36.8 C)   SpO2: 99%   Filed Weights   06/10/19 1429  Weight: 229 lb (103.9 kg)    GENERAL:alert, no distress and comfortable SKIN: skin color, texture, turgor are normal, no rashes or significant lesions EYES: normal, Conjunctiva are pink and non-injected, sclera clear  NECK: supple, thyroid normal size, non-tender, without nodularity LYMPH:  no palpable lymphadenopathy in the cervical, axillary  LUNGS: clear to auscultation and percussion with normal breathing effort HEART: regular rate & rhythm and no murmurs and no lower extremity edema ABDOMEN:abdomen soft, non-tender and normal bowel sounds Musculoskeletal:no cyanosis of digits and no clubbing  NEURO: alert & oriented x 3 with fluent speech, no focal motor/sensory deficits BREAST: S/p right lumpectomy: Surgical incision healed well. No palpable mass, nodules or adenopathy bilaterally. Breast exam benign.   LABORATORY DATA:  I have reviewed the  data as listed CBC Latest Ref Rng & Units 06/10/2019 04/29/2019 03/18/2019  WBC 4.0 - 10.5 K/uL 7.3 6.8 6.0  Hemoglobin 12.0 - 15.0 g/dL 12.4 11.5(L) 11.1(L)  Hematocrit 36.0 - 46.0 % 37.4 34.8(L) 34.0(L)  Platelets 150 - 400 K/uL 239 224 203     CMP Latest Ref Rng & Units 06/10/2019 04/29/2019 03/18/2019  Glucose 70 - 99 mg/dL 125(H) 112(H) 101(H)  BUN 6 - 20 mg/dL 16 11 14   Creatinine 0.44 - 1.00 mg/dL 0.89 0.90 0.92  Sodium 135 - 145 mmol/L 142 140 142  Potassium 3.5 - 5.1 mmol/L 3.6 3.8 3.6  Chloride 98 - 111 mmol/L 103 105 106  CO2 22 - 32 mmol/L 26 27 25   Calcium 8.9 - 10.3 mg/dL 9.2 8.9 9.2  Total Protein 6.5 - 8.1 g/dL 8.0 7.4 7.4  Total Bilirubin 0.3 - 1.2 mg/dL 0.3 0.3 0.3  Alkaline Phos 38 - 126 U/L 81 74 70  AST 15 - 41 U/L 18 15 25   ALT 0 - 44 U/L 22 18 30       RADIOGRAPHIC STUDIES: I have personally reviewed the radiological images as listed and agreed with the findings in the report. No results found.   ASSESSMENT & PLAN:  [name] [name] is a 48 y.o. 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 tolerating well. -I previously discussed she has been on Herceptin for a while with NED and the option of switching her Herceptin to monthly treatments. She opted to continue treatment every 3 weeks. -She underwent hysteroscopy with D&C on 11/27/18 to remove benign polyps that were recently found. -CT CAP from 02/23/19 shows no evidence of disease.  -She is clinically doing well. Labs reviewed, CBC and CMP WNL except BG 125. Her Physical exam unremarkable. There is no clinical concern for recurrence.  -Next mammogram on 07/23/19.  -Will proceed with Herceptin today and every 3 weeks. I discussed the option of Herceptin Hylecta which is an injection. She is willing to try it. We would flush her PAC every 2-3 treatment unless she wants to remove her PAC.  -Given her breast are asymmetrical from breast surgery, I discussed she can look for a bra from [employer] and request a prescription. She agreed.  -Given she has been stable on Maintenance Herceptin for more than 4 years, will push back scan and order on next visit. I will see or scan her sooner if she has concerns. F/u in 12 weeks and will order scan at that time.  -She opted to receive flu shot today    2. HTN -On losartangivingby Dr. BHaroldine Lawsin June 2019, dose increased in 08/2018.  -07/22/18 ECHO showed EF at 60-65%, GLS 22% -BP at139/83 (06/10/19).  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 -Residual neuropathy remains stable and intermittent. Not mentioned today, likely manageable.   4. Obesity  -I encourage pt to watch her diet, exercise and try to loose some weight  -She did lise weight,  but gained some of this back. I encouraged her to remain active and eat healthy diet.   5. Recent Vaginal spotting and sever cramping -She has had irregular periods since her chemo for breast cancer ended in 2016.  -She did have uterine polyps removed in early  2020. After 4 months of no periods, she had 1 episode of vaginal spotting which started and ended with severe cramping. This has not recurred since.  -I advised she sees her Gyn for exam and possible Pelvic US evaluation. She is agreeable.    Plan -flu shot today  -Will proceed with Herceptin today -Herceptin Hylecta injection in 3 weeks and then every 3 weeks.  -flush in 6 and 12 weeks  -lab and F/u in 12 weeks  -she will see her GYN    No problem-specific Assessment & Plan notes found for this encounter.   No orders of the defined types were placed in this encounter.  All questions were answered. The patient knows to call the clinic with any problems, questions or concerns. No barriers to learning was detected. I spent 20 minutes counseling the patient face to face. The total time spent in the appointment was 25 minutes and more than 50% was on counseling and review of test results     [name], MD 06/10/2019   I, Joslyn Devon, am acting as scribe for [name], MD.   I have reviewed the above documentation for accuracy and completeness, and I agree with the above.

## 2019-06-10 ENCOUNTER — Inpatient Hospital Stay: Payer: BC Managed Care – PPO

## 2019-06-10 ENCOUNTER — Inpatient Hospital Stay (HOSPITAL_BASED_OUTPATIENT_CLINIC_OR_DEPARTMENT_OTHER): Payer: BC Managed Care – PPO | Admitting: Hematology

## 2019-06-10 ENCOUNTER — Encounter: Payer: Self-pay | Admitting: Hematology

## 2019-06-10 ENCOUNTER — Other Ambulatory Visit: Payer: Self-pay

## 2019-06-10 VITALS — BP 139/83 | HR 91 | Temp 98.3°F | Resp 18 | Ht 70.0 in | Wt 229.0 lb

## 2019-06-10 DIAGNOSIS — C7800 Secondary malignant neoplasm of unspecified lung: Secondary | ICD-10-CM | POA: Diagnosis not present

## 2019-06-10 DIAGNOSIS — C50911 Malignant neoplasm of unspecified site of right female breast: Secondary | ICD-10-CM

## 2019-06-10 DIAGNOSIS — D630 Anemia in neoplastic disease: Secondary | ICD-10-CM | POA: Diagnosis not present

## 2019-06-10 DIAGNOSIS — Z23 Encounter for immunization: Secondary | ICD-10-CM

## 2019-06-10 DIAGNOSIS — Z5112 Encounter for antineoplastic immunotherapy: Secondary | ICD-10-CM | POA: Diagnosis not present

## 2019-06-10 DIAGNOSIS — Z95828 Presence of other vascular implants and grafts: Secondary | ICD-10-CM

## 2019-06-10 LAB — COMPREHENSIVE METABOLIC PANEL
ALT: 22 U/L (ref 0–44)
AST: 18 U/L (ref 15–41)
Albumin: 3.6 g/dL (ref 3.5–5.0)
Alkaline Phosphatase: 81 U/L (ref 38–126)
Anion gap: 13 (ref 5–15)
BUN: 16 mg/dL (ref 6–20)
CO2: 26 mmol/L (ref 22–32)
Calcium: 9.2 mg/dL (ref 8.9–10.3)
Chloride: 103 mmol/L (ref 98–111)
Creatinine, Ser: 0.89 mg/dL (ref 0.44–1.00)
GFR calc Af Amer: >60 mL/min (ref >60)
GFR calc non Af Amer: >60 mL/min (ref >60)
Glucose, Bld: 125 mg/dL — ABNORMAL HIGH (ref 70–99)
Potassium: 3.6 mmol/L (ref 3.5–5.1)
Sodium: 142 mmol/L (ref 135–145)
Total Bilirubin: 0.3 mg/dL (ref 0.3–1.2)
Total Protein: 8 g/dL (ref 6.5–8.1)

## 2019-06-10 LAB — CBC WITH DIFFERENTIAL/PLATELET
Abs Immature Granulocytes: 0.01 10*3/uL (ref 0.00–0.07)
Basophils Absolute: 0 10*3/uL (ref 0.0–0.1)
Basophils Relative: 0 %
Eosinophils Absolute: 0.1 10*3/uL (ref 0.0–0.5)
Eosinophils Relative: 2 %
HCT: 37.4 % (ref 36.0–46.0)
Hemoglobin: 12.4 g/dL (ref 12.0–15.0)
Immature Granulocytes: 0 %
Lymphocytes Relative: 28 %
Lymphs Abs: 2.1 10*3/uL (ref 0.7–4.0)
MCH: 29.4 pg (ref 26.0–34.0)
MCHC: 33.2 g/dL (ref 30.0–36.0)
MCV: 88.6 fL (ref 80.0–100.0)
Monocytes Absolute: 0.5 10*3/uL (ref 0.1–1.0)
Monocytes Relative: 6 %
Neutro Abs: 4.6 10*3/uL (ref 1.7–7.7)
Neutrophils Relative %: 64 %
Platelets: 239 10*3/uL (ref 150–400)
RBC: 4.22 MIL/uL (ref 3.87–5.11)
RDW: 12.8 % (ref 11.5–15.5)
WBC: 7.3 10*3/uL (ref 4.0–10.5)
nRBC: 0 % (ref 0.0–0.2)

## 2019-06-10 MED ORDER — TRASTUZUMAB-DKST CHEMO 150 MG IV SOLR
600.0000 mg | Freq: Once | INTRAVENOUS | Status: AC
  Administered 2019-06-10: 600 mg via INTRAVENOUS
  Filled 2019-06-10: qty 28.57

## 2019-06-10 MED ORDER — ACETAMINOPHEN 325 MG PO TABS
ORAL_TABLET | ORAL | Status: AC
  Filled 2019-06-10: qty 2

## 2019-06-10 MED ORDER — SODIUM CHLORIDE 0.9% FLUSH
10.0000 mL | Freq: Once | INTRAVENOUS | Status: AC
  Administered 2019-06-10: 10 mL via INTRAVENOUS
  Filled 2019-06-10: qty 10

## 2019-06-10 MED ORDER — INFLUENZA VAC SPLIT QUAD 0.5 ML IM SUSY
PREFILLED_SYRINGE | INTRAMUSCULAR | Status: AC
  Filled 2019-06-10: qty 0.5

## 2019-06-10 MED ORDER — ACETAMINOPHEN 325 MG PO TABS
650.0000 mg | ORAL_TABLET | Freq: Once | ORAL | Status: AC
  Administered 2019-06-10: 650 mg via ORAL

## 2019-06-10 MED ORDER — INFLUENZA VAC SPLIT QUAD 0.5 ML IM SUSY
0.5000 mL | PREFILLED_SYRINGE | Freq: Once | INTRAMUSCULAR | Status: AC
  Administered 2019-06-10: 0.5 mL via INTRAMUSCULAR

## 2019-06-10 MED ORDER — HEPARIN SOD (PORK) LOCK FLUSH 100 UNIT/ML IV SOLN
500.0000 [IU] | Freq: Once | INTRAVENOUS | Status: AC | PRN
  Administered 2019-06-10: 500 [IU]
  Filled 2019-06-10: qty 5

## 2019-06-10 MED ORDER — SODIUM CHLORIDE 0.9 % IJ SOLN: 10.0000 mL | INTRAMUSCULAR | Status: DC | PRN

## 2019-06-10 MED ORDER — SODIUM CHLORIDE 0.9 % IV SOLN
Freq: Once | INTRAVENOUS | Status: AC
  Administered 2019-06-10: 15:00:00 via INTRAVENOUS
  Filled 2019-06-10: qty 250

## 2019-06-10 MED ORDER — SODIUM CHLORIDE 0.9% FLUSH
10.0000 mL | Freq: Once | INTRAVENOUS | Status: AC
  Administered 2019-06-10: 10 mL
  Filled 2019-06-10: qty 10

## 2019-06-10 NOTE — Patient Instructions (Signed)
[facility] Discharge Instructions for Patients Receiving Chemotherapy  Today you received the following chemotherapy agents herceptin/ Ogivri   To help prevent nausea and vomiting after your treatment, we encourage you to take your nausea medication as directed.     If you develop nausea and vomiting that is not controlled by your nausea medication, call the clinic.   BELOW ARE SYMPTOMS THAT SHOULD BE REPORTED IMMEDIATELY:  *FEVER GREATER THAN 100.5 F  *CHILLS WITH OR WITHOUT FEVER  NAUSEA AND VOMITING THAT IS NOT CONTROLLED WITH YOUR NAUSEA MEDICATION  *UNUSUAL SHORTNESS OF BREATH  *UNUSUAL BRUISING OR BLEEDING  TENDERNESS IN MOUTH AND THROAT WITH OR WITHOUT PRESENCE OF ULCERS  *URINARY PROBLEMS  *BOWEL PROBLEMS  UNUSUAL RASH Items with * indicate a potential emergency and should be followed up as soon as possible.  Feel free to call the clinic you have any questions or concerns. The clinic phone number is [phone].

## 2019-06-10 NOTE — Patient Instructions (Signed)

## 2019-06-11 ENCOUNTER — Telehealth: Payer: Self-pay | Admitting: Hematology

## 2019-06-11 NOTE — Telephone Encounter (Signed)
I talk with patient regarding schedule and needed late afternoons

## 2019-06-30 ENCOUNTER — Other Ambulatory Visit: Payer: Self-pay | Admitting: Hematology

## 2019-07-01 ENCOUNTER — Inpatient Hospital Stay: Payer: BC Managed Care – PPO | Attending: Hematology

## 2019-07-01 ENCOUNTER — Other Ambulatory Visit: Payer: Self-pay

## 2019-07-01 ENCOUNTER — Ambulatory Visit: Payer: BC Managed Care – PPO

## 2019-07-01 VITALS — BP 147/88 | HR 78 | Temp 98.3°F | Resp 17

## 2019-07-01 DIAGNOSIS — I10 Essential (primary) hypertension: Secondary | ICD-10-CM | POA: Insufficient documentation

## 2019-07-01 DIAGNOSIS — Z171 Estrogen receptor negative status [ER-]: Secondary | ICD-10-CM | POA: Diagnosis not present

## 2019-07-01 DIAGNOSIS — Z5112 Encounter for antineoplastic immunotherapy: Secondary | ICD-10-CM | POA: Diagnosis present

## 2019-07-01 DIAGNOSIS — C50311 Malignant neoplasm of lower-inner quadrant of right female breast: Secondary | ICD-10-CM | POA: Insufficient documentation

## 2019-07-01 DIAGNOSIS — C50911 Malignant neoplasm of unspecified site of right female breast: Secondary | ICD-10-CM

## 2019-07-01 DIAGNOSIS — C7800 Secondary malignant neoplasm of unspecified lung: Secondary | ICD-10-CM | POA: Diagnosis not present

## 2019-07-01 MED ORDER — SODIUM CHLORIDE 0.9 % IV SOLN
Freq: Once | INTRAVENOUS | Status: AC
  Administered 2019-07-01: 16:00:00 via INTRAVENOUS
  Filled 2019-07-01: qty 250

## 2019-07-01 MED ORDER — HEPARIN SOD (PORK) LOCK FLUSH 100 UNIT/ML IV SOLN
500.0000 [IU] | Freq: Once | INTRAVENOUS | Status: AC | PRN
  Administered 2019-07-01: 17:00:00 500 [IU]
  Filled 2019-07-01: qty 5

## 2019-07-01 MED ORDER — TRASTUZUMAB-DKST CHEMO 150 MG IV SOLR
600.0000 mg | Freq: Once | INTRAVENOUS | Status: AC
  Administered 2019-07-01: 17:00:00 600 mg via INTRAVENOUS
  Filled 2019-07-01: qty 28.57

## 2019-07-01 MED ORDER — SODIUM CHLORIDE 0.9 % IJ SOLN
10.0000 mL | INTRAMUSCULAR | Status: DC | PRN
  Administered 2019-07-01: 10 mL
  Filled 2019-07-01: qty 10

## 2019-07-01 NOTE — Progress Notes (Signed)
Patient does not want to switch to SQ route of Trastuzumab. Dr. Burr Medico and pharmacy team updated and per Dr. Burr Medico: OK to resume IV route if insurance authorizes.

## 2019-07-01 NOTE — Patient Instructions (Signed)
Trastuzumab injection for infusion What is this medicine? TRASTUZUMAB (tras TOO zoo mab) is a monoclonal antibody. It is used to treat breast cancer and stomach cancer. This medicine may be used for other purposes; ask your health care provider or pharmacist if you have questions. COMMON BRAND NAME(S): Herceptin, Galvin Proffer, Trazimera What should I tell my health care provider before I take this medicine? They need to know if you have any of these conditions:  heart disease  heart failure  lung or breathing disease, like asthma  an unusual or allergic reaction to trastuzumab, benzyl alcohol, or other medications, foods, dyes, or preservatives  pregnant or trying to get pregnant  breast-feeding How should I use this medicine? This drug is given as an infusion into a vein. It is administered in a hospital or clinic by a specially trained health care professional. Talk to your pediatrician regarding the use of this medicine in children. This medicine is not approved for use in children. Overdosage: If you think you have taken too much of this medicine contact a poison control center or emergency room at once. NOTE: This medicine is only for you. Do not share this medicine with others. What if I miss a dose? It is important not to miss a dose. Call your doctor or health care professional if you are unable to keep an appointment. What may interact with this medicine? This medicine may interact with the following medications:  certain types of chemotherapy, such as daunorubicin, doxorubicin, epirubicin, and idarubicin This list may not describe all possible interactions. Give your health care provider a list of all the medicines, herbs, non-prescription drugs, or dietary supplements you use. Also tell them if you smoke, drink alcohol, or use illegal drugs. Some items may interact with your medicine. What should I watch for while using this medicine? Visit your  doctor for checks on your progress. Report any side effects. Continue your course of treatment even though you feel ill unless your doctor tells you to stop. Call your doctor or health care professional for advice if you get a fever, chills or sore throat, or other symptoms of a cold or flu. Do not treat yourself. Try to avoid being around people who are sick. You may experience fever, chills and shaking during your first infusion. These effects are usually mild and can be treated with other medicines. Report any side effects during the infusion to your health care professional. Fever and chills usually do not happen with later infusions. Do not become pregnant while taking this medicine or for 7 months after stopping it. Women should inform their doctor if they wish to become pregnant or think they might be pregnant. Women of child-bearing potential will need to have a negative pregnancy test before starting this medicine. There is a potential for serious side effects to an unborn child. Talk to your health care professional or pharmacist for more information. Do not breast-feed an infant while taking this medicine or for 7 months after stopping it. Women must use effective birth control with this medicine. What side effects may I notice from receiving this medicine? Side effects that you should report to your doctor or health care professional as soon as possible:  allergic reactions like skin rash, itching or hives, swelling of the face, lips, or tongue  chest pain or palpitations  cough  dizziness  feeling faint or lightheaded, falls  fever  general ill feeling or flu-like symptoms  signs of worsening heart failure like  breathing problems; swelling in your legs and feet  unusually weak or tired Side effects that usually do not require medical attention (report to your doctor or health care professional if they continue or are bothersome):  bone pain  changes in  taste  diarrhea  joint pain  nausea/vomiting  weight loss This list may not describe all possible side effects. Call your doctor for medical advice about side effects. You may report side effects to FDA at 1-800-FDA-1088. Where should I keep my medicine? This drug is given in a hospital or clinic and will not be stored at home. NOTE: This sheet is a summary. It may not cover all possible information. If you have questions about this medicine, talk to your doctor, pharmacist, or health care provider.  2020 Elsevier/Gold Standard (2016-07-24 14:37:52)  Coronavirus (COVID-19) Are you at risk?  Are you at risk for the Coronavirus (COVID-19)?  To be considered HIGH RISK for Coronavirus (COVID-19), you have to meet the following criteria:  . Traveled to [place], [place], [place], [place] or [place]; or in the [place] to [place], [place], [place], or [place]; and have fever, cough, and shortness of breath within the last 2 weeks of travel OR . Been in close contact with a person diagnosed with COVID-19 within the last 2 weeks and have fever, cough, and shortness of breath . IF YOU DO NOT MEET THESE CRITERIA, YOU ARE CONSIDERED LOW RISK FOR COVID-19.  What to do if you are HIGH RISK for COVID-19?  Marland Kitchen If you are having a medical emergency, call 911. . Seek medical care right away. Before you go to a doctor's office, urgent care or emergency department, call ahead and tell them about your recent travel, contact with someone diagnosed with COVID-19, and your symptoms. You should receive instructions from your physician's office regarding next steps of care.  . When you arrive at healthcare provider, tell the healthcare staff immediately you have returned from visiting [place], [place], [place], [place] or [place]; or traveled in the [place] to [place], [place], [place], or [place]; in the last two weeks or you have been in close contact with a person diagnosed with COVID-19  in the last 2 weeks.   . Tell the health care staff about your symptoms: fever, cough and shortness of breath. . After you have been seen by a medical provider, you will be either: o Tested for (COVID-19) and discharged home on quarantine except to seek medical care if symptoms worsen, and asked to  - Stay home and avoid contact with others until you get your results (4-5 days)  - Avoid travel on public transportation if possible (such as bus, train, or airplane) or o Sent to the Emergency Department by EMS for evaluation, COVID-19 testing, and possible admission depending on your condition and test results.  What to do if you are LOW RISK for COVID-19?  Reduce your risk of any infection by using the same precautions used for avoiding the common cold or flu:  Marland Kitchen Wash your hands often with soap and warm water for at least 20 seconds.  If soap and water are not readily available, use an alcohol-based hand sanitizer with at least 60% alcohol.  . If coughing or sneezing, cover your mouth and nose by coughing or sneezing into the elbow areas of your shirt or coat, into a tissue or into your sleeve (not your hands). . Avoid shaking hands with others and consider head nods  or verbal greetings only. . Avoid touching your eyes, nose, or mouth with unwashed hands.  . Avoid close contact with people who are sick. . Avoid places or events with large numbers of people in one location, like concerts or sporting events. . Carefully consider travel plans you have or are making. . If you are planning any travel outside or inside the [place], visit the CDC's Travelers' Health webpage for the latest health notices. . If you have some symptoms but not all symptoms, continue to monitor at home and seek medical attention if your symptoms worsen. . If you are having a medical emergency, call 911.   [place] / e-Visit:  [email or web address]         [facility]: [facility]: [phone]                   [facility]: [phone]

## 2019-07-06 ENCOUNTER — Telehealth: Payer: Self-pay

## 2019-07-06 NOTE — Telephone Encounter (Signed)
Faxed signed order for mammogram back to [facility], received confirmation fax went through, sent to HIM for scanning to chart.

## 2019-07-22 ENCOUNTER — Telehealth: Payer: Self-pay

## 2019-07-22 ENCOUNTER — Inpatient Hospital Stay: Payer: BC Managed Care – PPO | Attending: Hematology

## 2019-07-22 ENCOUNTER — Other Ambulatory Visit: Payer: BC Managed Care – PPO

## 2019-07-22 ENCOUNTER — Other Ambulatory Visit: Payer: Self-pay

## 2019-07-22 ENCOUNTER — Ambulatory Visit: Payer: BC Managed Care – PPO

## 2019-07-22 VITALS — BP 137/87 | HR 98 | Temp 98.5°F | Resp 16

## 2019-07-22 DIAGNOSIS — Z5112 Encounter for antineoplastic immunotherapy: Secondary | ICD-10-CM | POA: Diagnosis not present

## 2019-07-22 DIAGNOSIS — C50311 Malignant neoplasm of lower-inner quadrant of right female breast: Secondary | ICD-10-CM | POA: Diagnosis present

## 2019-07-22 DIAGNOSIS — C50911 Malignant neoplasm of unspecified site of right female breast: Secondary | ICD-10-CM

## 2019-07-22 MED ORDER — HEPARIN SOD (PORK) LOCK FLUSH 100 UNIT/ML IV SOLN
500.0000 [IU] | Freq: Once | INTRAVENOUS | Status: DC | PRN
  Filled 2019-07-22: qty 5

## 2019-07-22 MED ORDER — ACETAMINOPHEN 325 MG PO TABS: 650.0000 mg | ORAL_TABLET | Freq: Once | ORAL | Status: DC

## 2019-07-22 MED ORDER — SODIUM CHLORIDE 0.9 % IJ SOLN: 10.0000 mL | INTRAMUSCULAR | Status: DC | PRN

## 2019-07-22 MED ORDER — SODIUM CHLORIDE 0.9% FLUSH
10.0000 mL | Freq: Once | INTRAVENOUS | Status: AC
  Administered 2019-07-22: 10 mL via INTRAVENOUS
  Filled 2019-07-22: qty 10

## 2019-07-22 MED ORDER — TRASTUZUMAB-DKST CHEMO 150 MG IV SOLR
600.0000 mg | Freq: Once | INTRAVENOUS | Status: AC
  Administered 2019-07-22: 600 mg via INTRAVENOUS
  Filled 2019-07-22: qty 28.57

## 2019-07-22 MED ORDER — SODIUM CHLORIDE 0.9 % IV SOLN
Freq: Once | INTRAVENOUS | Status: AC
  Administered 2019-07-22: 16:00:00 via INTRAVENOUS
  Filled 2019-07-22: qty 250

## 2019-07-22 NOTE — Telephone Encounter (Signed)
Patient was given a script for lumpectomy products.

## 2019-07-22 NOTE — Patient Instructions (Signed)
[facility] °Discharge Instructions for Patients Receiving Chemotherapy ° °Today you received the following chemotherapy agents Trastuzumab ° °To help prevent nausea and vomiting after your treatment, we encourage you to take your nausea medication as directed. °  °If you develop nausea and vomiting that is not controlled by your nausea medication, call the clinic.  ° °BELOW ARE SYMPTOMS THAT SHOULD BE REPORTED IMMEDIATELY: °· *FEVER GREATER THAN 100.5 F °· *CHILLS WITH OR WITHOUT FEVER °· NAUSEA AND VOMITING THAT IS NOT CONTROLLED WITH YOUR NAUSEA MEDICATION °· *UNUSUAL SHORTNESS OF BREATH °· *UNUSUAL BRUISING OR BLEEDING °· TENDERNESS IN MOUTH AND THROAT WITH OR WITHOUT PRESENCE OF ULCERS °· *URINARY PROBLEMS °· *BOWEL PROBLEMS °· UNUSUAL RASH °Items with * indicate a potential emergency and should be followed up as soon as possible. ° °Feel free to call the clinic should you have any questions or concerns. The clinic phone number is [phone]. ° °Please show the CHEMO ALERT CARD at check-in to the Emergency Department and triage nurse. ° ° °

## 2019-08-11 ENCOUNTER — Telehealth: Payer: Self-pay | Admitting: *Deleted

## 2019-08-11 NOTE — Telephone Encounter (Signed)
[name] [name] left a message stating she would like to change to Herceptin injection rather than IV treatment. Wants to try injection while she is on vacation

## 2019-08-11 NOTE — Telephone Encounter (Signed)
I will speak with pharmacy and change it for tomorrow.   [name] MD

## 2019-08-11 NOTE — Telephone Encounter (Signed)
Notified of message below

## 2019-08-12 ENCOUNTER — Inpatient Hospital Stay: Payer: BC Managed Care – PPO

## 2019-08-12 ENCOUNTER — Ambulatory Visit: Payer: BC Managed Care – PPO

## 2019-08-12 ENCOUNTER — Other Ambulatory Visit: Payer: Self-pay

## 2019-08-12 VITALS — BP 156/87 | HR 72 | Temp 98.2°F | Resp 18 | Wt 224.0 lb

## 2019-08-12 DIAGNOSIS — Z95828 Presence of other vascular implants and grafts: Secondary | ICD-10-CM

## 2019-08-12 DIAGNOSIS — C7800 Secondary malignant neoplasm of unspecified lung: Secondary | ICD-10-CM

## 2019-08-12 DIAGNOSIS — C50911 Malignant neoplasm of unspecified site of right female breast: Secondary | ICD-10-CM

## 2019-08-12 DIAGNOSIS — Z5112 Encounter for antineoplastic immunotherapy: Secondary | ICD-10-CM | POA: Diagnosis not present

## 2019-08-12 LAB — COMPREHENSIVE METABOLIC PANEL
ALT: 20 U/L (ref 0–44)
AST: 18 U/L (ref 15–41)
Albumin: 3.9 g/dL (ref 3.5–5.0)
Alkaline Phosphatase: 66 U/L (ref 38–126)
Anion gap: 11 (ref 5–15)
BUN: 13 mg/dL (ref 6–20)
CO2: 26 mmol/L (ref 22–32)
Calcium: 9.5 mg/dL (ref 8.9–10.3)
Chloride: 105 mmol/L (ref 98–111)
Creatinine, Ser: 0.93 mg/dL (ref 0.44–1.00)
GFR calc Af Amer: >60 mL/min (ref >60)
GFR calc non Af Amer: >60 mL/min (ref >60)
Glucose, Bld: 68 mg/dL — ABNORMAL LOW (ref 70–99)
Potassium: 3.9 mmol/L (ref 3.5–5.1)
Sodium: 142 mmol/L (ref 135–145)
Total Bilirubin: 0.4 mg/dL (ref 0.3–1.2)
Total Protein: 7.9 g/dL (ref 6.5–8.1)

## 2019-08-12 LAB — CBC WITH DIFFERENTIAL/PLATELET
Abs Immature Granulocytes: 0.01 10*3/uL (ref 0.00–0.07)
Basophils Absolute: 0 10*3/uL (ref 0.0–0.1)
Basophils Relative: 0 %
Eosinophils Absolute: 0.1 10*3/uL (ref 0.0–0.5)
Eosinophils Relative: 2 %
HCT: 37.6 % (ref 36.0–46.0)
Hemoglobin: 12.3 g/dL (ref 12.0–15.0)
Immature Granulocytes: 0 %
Lymphocytes Relative: 28 %
Lymphs Abs: 1.9 10*3/uL (ref 0.7–4.0)
MCH: 28.9 pg (ref 26.0–34.0)
MCHC: 32.7 g/dL (ref 30.0–36.0)
MCV: 88.5 fL (ref 80.0–100.0)
Monocytes Absolute: 0.5 10*3/uL (ref 0.1–1.0)
Monocytes Relative: 8 %
Neutro Abs: 4.1 10*3/uL (ref 1.7–7.7)
Neutrophils Relative %: 62 %
Platelets: 240 10*3/uL (ref 150–400)
RBC: 4.25 MIL/uL (ref 3.87–5.11)
RDW: 13.3 % (ref 11.5–15.5)
WBC: 6.5 10*3/uL (ref 4.0–10.5)
nRBC: 0 % (ref 0.0–0.2)

## 2019-08-12 MED ORDER — SODIUM CHLORIDE 0.9% FLUSH
10.0000 mL | Freq: Once | INTRAVENOUS | Status: AC
  Administered 2019-08-12: 10 mL
  Filled 2019-08-12: qty 10

## 2019-08-12 MED ORDER — TRASTUZUMAB-HYALURONIDASE-OYSK 600-10000 MG-UNT/5ML ~~LOC~~ SOLN
600.0000 mg | Freq: Once | SUBCUTANEOUS | Status: AC
  Administered 2019-08-12: 600 mg via SUBCUTANEOUS
  Filled 2019-08-12: qty 5

## 2019-08-12 MED ORDER — ACETAMINOPHEN 325 MG PO TABS: 650.0000 mg | ORAL_TABLET | Freq: Once | ORAL | Status: DC

## 2019-08-12 MED ORDER — SODIUM CHLORIDE 0.9% FLUSH
10.0000 mL | INTRAVENOUS | Status: DC | PRN
  Administered 2019-08-12: 10 mL
  Filled 2019-08-12: qty 10

## 2019-08-12 MED ORDER — HEPARIN SOD (PORK) LOCK FLUSH 100 UNIT/ML IV SOLN
500.0000 [IU] | Freq: Once | INTRAVENOUS | Status: AC | PRN
  Administered 2019-08-12: 500 [IU]
  Filled 2019-08-12: qty 5

## 2019-08-12 NOTE — Patient Instructions (Signed)

## 2019-08-12 NOTE — Patient Instructions (Signed)
[place] Discharge Instructions for Patients Receiving Chemotherapy  Today you received the following chemotherapy agents Trastuzumab (HERCEPTIN HYLECTA).  To help prevent nausea and vomiting after your treatment, we encourage you to take your nausea medication as prescribed.   If you develop nausea and vomiting that is not controlled by your nausea medication, call the clinic.   BELOW ARE SYMPTOMS THAT SHOULD BE REPORTED IMMEDIATELY:  *FEVER GREATER THAN 100.5 F  *CHILLS WITH OR WITHOUT FEVER  NAUSEA AND VOMITING THAT IS NOT CONTROLLED WITH YOUR NAUSEA MEDICATION  *UNUSUAL SHORTNESS OF BREATH  *UNUSUAL BRUISING OR BLEEDING  TENDERNESS IN MOUTH AND THROAT WITH OR WITHOUT PRESENCE OF ULCERS  *URINARY PROBLEMS  *BOWEL PROBLEMS  UNUSUAL RASH Items with * indicate a potential emergency and should be followed up as soon as possible.  Feel free to call the clinic should you have any questions or concerns. The clinic phone number is [phone].  Please show the [place] at check-in to the Emergency Department and triage nurse.  Trastuzumab; Hyaluronidase injection What is this medicine? TRASTUZUMAB; HYALURONIDASE (tras TOO zoo mab / hye al ur ON i dase) is used to treat breast cancer and stomach cancer. Trastuzumab is a monoclonal antibody. Hyaluronidase is used to improve the effects of trastuzumab. This medicine may be used for other purposes; ask your health care provider or pharmacist if you have questions. COMMON BRAND NAME(S): HERCEPTIN HYLECTA What should I tell my health care provider before I take this medicine? They need to know if you have any of these conditions:  heart disease  heart failure  lung or breathing disease, like asthma  an unusual or allergic reaction to trastuzumab, or other medications, foods, dyes, or preservatives  pregnant or trying to get pregnant  breast-feeding How should I use this medicine? This medicine is  for injection under the skin. It is given by a health care professional in a hospital or clinic setting. Talk to your pediatrician regarding the use of this medicine in children. This medicine is not approved for use in children. Overdosage: If you think you have taken too much of this medicine contact a poison control center or emergency room at once. NOTE: This medicine is only for you. Do not share this medicine with others. What if I miss a dose? It is important not to miss a dose. Call your doctor or health care professional if you are unable to keep an appointment. What may interact with this medicine? This medicine may interact with the following medications:  certain types of chemotherapy, such as daunorubicin, doxorubicin, epirubicin, and idarubicin This list may not describe all possible interactions. Give your health care provider a list of all the medicines, herbs, non-prescription drugs, or dietary supplements you use. Also tell them if you smoke, drink alcohol, or use illegal drugs. Some items may interact with your medicine. What should I watch for while using this medicine? Visit your doctor for checks on your progress. Report any side effects. Continue your course of treatment even though you feel ill unless your doctor tells you to stop. Call your doctor or health care professional for advice if you get a fever, chills or sore throat, or other symptoms of a cold or flu. Do not treat yourself. Try to avoid being around people who are sick. You may experience fever, chills and shaking during your first infusion. These effects are usually mild and can be treated with other medicines. Report any side effects during the  infusion to your health care professional. Fever and chills usually do not happen with later infusions. Do not become pregnant while taking this medicine or for 7 months after stopping it. Women should inform their doctor if they wish to become pregnant or think they might  be pregnant. Women of child-bearing potential will need to have a negative pregnancy test before starting this medicine. There is a potential for serious side effects to an unborn child. Talk to your health care professional or pharmacist for more information. Do not breast-feed an infant while taking this medicine or for 7 months after stopping it. What side effects may I notice from receiving this medicine? Side effects that you should report to your doctor or health care professional as soon as possible:  allergic reactions like skin rash, itching or hives, swelling of the face, lips, or tongue  breathing problems  chest pain or palpitations  cough  fever  general ill feeling or flu-like symptoms  signs of worsening heart failure like breathing problems; swelling in your legs and feet Side effects that usually do not require medical attention (report these to your doctor or health care professional if they continue or are bothersome):  bone pain  changes in taste  diarrhea  joint pain  nausea/vomiting  unusually weak or tired  weight loss This list may not describe all possible side effects. Call your doctor for medical advice about side effects. You may report side effects to FDA at 1-800-FDA-1088. Where should I keep my medicine? This drug is given in a hospital or clinic and will not be stored at home. NOTE: This sheet is a summary. It may not cover all possible information. If you have questions about this medicine, talk to your doctor, pharmacist, or health care provider.  2020 Elsevier/Gold Standard (2017-10-18 21:54:17)  Coronavirus (COVID-19) Are you at risk?  Are you at risk for the Coronavirus (COVID-19)?  To be considered HIGH RISK for Coronavirus (COVID-19), you have to meet the following criteria:  . Traveled to [place], [place], [place], [place] or [place]; or in the [place] to [place], [place], [place], or [place]; and have fever, cough, and  shortness of breath within the last 2 weeks of travel OR . Been in close contact with a person diagnosed with COVID-19 within the last 2 weeks and have fever, cough, and shortness of breath . IF YOU DO NOT MEET THESE CRITERIA, YOU ARE CONSIDERED LOW RISK FOR COVID-19.  What to do if you are HIGH RISK for COVID-19?  Marland Kitchen If you are having a medical emergency, call 911. . Seek medical care right away. Before you go to a doctor's office, urgent care or emergency department, call ahead and tell them about your recent travel, contact with someone diagnosed with COVID-19, and your symptoms. You should receive instructions from your physician's office regarding next steps of care.  . When you arrive at healthcare provider, tell the healthcare staff immediately you have returned from visiting [place], [place], [place], [place] or [place]; or traveled in the [place] to [place], [place], [place], or [place]; in the last two weeks or you have been in close contact with a person diagnosed with COVID-19 in the last 2 weeks.   . Tell the health care staff about your symptoms: fever, cough and shortness of breath. . After you have been seen by a medical provider, you will be either: o Tested for (COVID-19) and discharged home on quarantine except to  seek medical care if symptoms worsen, and asked to  - Stay home and avoid contact with others until you get your results (4-5 days)  - Avoid travel on public transportation if possible (such as bus, train, or airplane) or o Sent to the Emergency Department by EMS for evaluation, COVID-19 testing, and possible admission depending on your condition and test results.  What to do if you are LOW RISK for COVID-19?  Reduce your risk of any infection by using the same precautions used for avoiding the common cold or flu:  Marland Kitchen Wash your hands often with soap and warm water for at least 20 seconds.  If soap and water are not readily available, use an alcohol-based hand  sanitizer with at least 60% alcohol.  . If coughing or sneezing, cover your mouth and nose by coughing or sneezing into the elbow areas of your shirt or coat, into a tissue or into your sleeve (not your hands). . Avoid shaking hands with others and consider head nods or verbal greetings only. . Avoid touching your eyes, nose, or mouth with unwashed hands.  . Avoid close contact with people who are sick. . Avoid places or events with large numbers of people in one location, like concerts or sporting events. . Carefully consider travel plans you have or are making. . If you are planning any travel outside or inside the [place], visit the CDC's Travelers' Health webpage for the latest health notices. . If you have some symptoms but not all symptoms, continue to monitor at home and seek medical attention if your symptoms worsen. . If you are having a medical emergency, call 911.   [place] / e-Visit: [email or web address]         [facility]: [facility]: [phone]                   [facility]: [phone]

## 2019-09-01 NOTE — Progress Notes (Signed)
[place]   Telephone:([phone]) [phone] [phone]) [phone]   Clinic Follow up Note   Patient Care Team: Virginia Rochester, [place] as PCP - General (Family Medicine) [name], DO (Family Medicine) Holley Bouche, NP (Inactive) as Nurse Practitioner (Nurse Practitioner) Stark Klein, MD as Consulting Physician (General Surgery) Arloa Koh, MD (Inactive) as Consulting Physician (Radiation Oncology) [name], MD as Consulting Physician (Hematology) Clydie Braun, MD (Obstetrics and Gynecology) 09/02/2019  CHIEF COMPLAINT: F/u right breast cancer    SUMMARY OF ONCOLOGIC HISTORY: Oncology History Overview Note  Breast cancer 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   Staging form: Breast, AJCC 7th Edition     Clinical stage from 07/22/2014: Stage IV (T3, N1, M1) - Unsigned     Breast cancer 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 ([place])  07/02/2014 Mammogram   Mammogram showed a 2cm right beast mass and a 1.8cm right axillary node. MRI breast on 07/16/2014 showed 7cm R breast lesion and 4.4cm r axillary node    07/02/2014 Imaging   CT CAP: a 4.7cm mass in LUL lung and a 2.1cm mas in RML, and a small nodule in RUL, suspecious for metastasis     07/09/2014 Initial Diagnosis   right IDA with b/l lung lesions, ER-/PR-/HER2+   07/09/2014 Initial Biopsy   US guided right breast mass and axillary node biopsy showed IDA, and DCIS, ER-/PR-/HER2+   07/26/2014 Pathologic Stage   Left lung mass by IR, path revealed high grade carcinoma, morphology similar to breast tumor biopsy, TTF(-), NapsinA(-), ER(-)   08/04/2014 - 03/22/2015 Chemotherapy   weekly Paclitaxel 73m/m2, trastuzumab and pertuzumab every 3 weeks   08/30/2014 Genetic Testing   BreastNext panel was negative. 17 genes including BRCA1, BRCA2, were negative for mutations.    10/04/2014 Imaging   Interval decrease in the right axillary lymphadenopathy. Bilateral pulmonary lesions with left hilar lymphadenopathy also markedly decreased in the  interval. The left hilar lymphadenopathy has resolved.   12/20/2014 Imaging   restaging CT showed stable disease, no new lesions    03/29/2015 Pathology Results    right breast lumpectomy showed  chemotherapy treatment effect,  a 1 mm residual tumor,   margins were widely negative, 5 sentinel lymph nodes and 2 axillary lymph nodes were negative.    03/29/2015 Surgery    right breast lumpectomy and sentinel lymph node biopsy  by Dr. BBarry Dienes  04/12/2015 -  Chemotherapy    Herceptin maintenance therapy , 6 mg/kg, every 3 weeks   05/03/2015 - 06/14/2015 Radiation Therapy   right breast adjuvant irradiation by Dr. MValere Dross   08/28/2016 Imaging   CT CAP w Contrast  IMPRESSION: 1. No acute process or evidence of metastatic disease within the chest, abdomen, or pelvis. 2. Similar to less well-defined left upper lobe density, likely scarring. No evidence of new or progressive pulmonary metastasis. 3. Right nephrolithiasis.   01/22/2017 Imaging   CT Chest W Contrast 01/22/17 IMPRESSION: 1. Stable exam. No new or progressive findings. No evidence for metastatic disease. 2. Left upper lobe architectural distortion/scarring is stable. 3. Nonobstructing right renal stone.   07/31/2017 Imaging   Bone Scan Whole Body 07/31/17  IMPRESSION: 1. No scintigraphic evidence of osseous metastatic disease. 2. Thoracolumbar scoliosis.    07/31/2017 Imaging   CT CAP W Contrast 07/31/17 IMPRESSION: 1. No findings of active or recurrent malignancy. 2. Other imaging findings of potential clinical significance: Aortic Atherosclerosis (ICD10-I70.0). Mild cardiomegaly. Postoperative and radiation therapy findings in the right chest. Nonobstructive right nephrolithiasis. Thoracolumbar scoliosis.  01/20/2018 Echocardiogram   ECHO 01/20/2018 Study Conclusions  - Left ventricle: The cavity size was normal. Wall thickness was   normal. Systolic function was normal. The estimated ejection   fraction was in  the range of 55% to 60%. Wall motion was normal;   there were no regional wall motion abnormalities. Doppler   parameters are consistent with abnormal left ventricular   relaxation (grade 1 diastolic dysfunction). - Impressions: GLS -20.7% LS&' 10.4 cm.    02/17/2018 Imaging   02/17/2018 CT CAP IMPRESSION: 1. No evidence for residual or recurrent tumor or metastatic disease. 2.  Aortic Atherosclerosis (ICD10-I70.0). 3. Right renal calculi. 4. Scoliosis.   08/11/2018 Imaging   CT CAP W Contrast 08/11/18  IMPRESSION: No evidence for localized recurrence or metastatic disease in the chest, abdomen or pelvis.   02/23/2019 Imaging   CT CAP W Contrast  IMPRESSION: 1. No findings of active malignancy in the chest, abdomen, or pelvis. 2. 3 mm right kidney lower pole nonobstructive renal calculus. 3. Thoracolumbar scoliosis.     CURRENT THERAPY:  MaintenanceHerceptin every 3 weeks since 04/12/15. Switched to Herceptin Hylecta injection q3weeks after 06/10/19.  INTERVAL HISTORY: Ms. Turrubiates returns for f/u and herceptin injection as scheduled. She received first herceptin hylecta injection 3 weeks ago. She tolerated well. Mild tenderness and hyperpigmentation at the site that resolved. She was less fatigued than with herceptin infusion. She remains functional. Appetite is normal. She has lost some weight, eating less fast food since working from home in November 2020. 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 She is having sinus congestion with clear drainage, mild cough. No fever, chills, chest pain, or dyspnea. She is trying to manage with Nyquil and zyrtec. Denies n/v/c/d. Neuropathy more in feet than fingers, stable. She had menstrual period in October and again on 08/07/19 lasting 5 days, not heavy. She reports Dr. Earleen Newport G[facility] did [place] and was told she's perimenopausal, needs to schedule a f/u.    MEDICAL HISTORY:  Past Medical History:    Diagnosis Date  . Breast cancer ([place])   . Breast cancer ([place])   . GERD (gastroesophageal reflux disease)    during pregnancy   . Pneumonia    hx of pneumonia 08/2013   . S/P radiation therapy 05/03/2015 through 06/14/2015    Right breast 4680 cGy in 26 sessions, right breast boost 1000 cGy in 5 sessions. Right supraclavicular/axillary region 4680 cGy with a supplemental PA field to bring the axillary dose up to 4500 cGy in 26 sessions    SURGICAL HISTORY: Past Surgical History:  Procedure Laterality Date  . BREAST LUMPECTOMY WITH RADIOACTIVE SEED AND SENTINEL LYMPH NODE BIOPSY Right 03/29/2015   Procedure: BREAST LUMPECTOMY WITH RADIOACTIVE SEED AND SENTINEL LYMPH NODE BIOPSY;  Surgeon: Stark Klein, MD;  Location: [facility];  Service: General;  Laterality: Right;  . CESAREAN SECTION    . CHOLECYSTECTOMY    . ESSURE TUBAL LIGATION    . PORTACATH PLACEMENT Left 07/21/2014   Procedure: INSERTION PORT-A-CATH;  Surgeon: Stark Klein, MD;  Location: [facility];  Service: General;  Laterality: Left;  . WISDOM TOOTH EXTRACTION      I have reviewed the social history and family history with the patient and they are unchanged from previous note.  ALLERGIES:  is allergic to adhesive [tape]; aspirin; and codeine.  MEDICATIONS:  Current Outpatient Medications  Medication Sig Dispense Refill  . acetaminophen (TYLENOL) 500 MG tablet Take  500 mg by mouth every 6 (six) hours as needed.    . Calcium-Phosphorus-Vitamin D (CALCIUM GUMMIES PO) Take by mouth daily.    . Camphor-Eucalyptus-Menthol (VICKS VAPORUB EX) Apply 1 application topically at bedtime. Applies under nose, throat and on chest.    . cetirizine (ZYRTEC) 10 MG tablet Take 10 mg by mouth daily as needed for allergies (allergies).     . fluticasone (FLONASE) 50 MCG/ACT nasal spray Place into both nostrils daily.    . hydrocortisone 2.5 % cream APPLY TOPICALLY TO THE AFFECTED  AREA TWICE DAILY 454 g 0  . ibuprofen (ADVIL,MOTRIN) 800 MG tablet Take 1 tablet (800 mg total) by mouth every 8 (eight) hours as needed. 60 tablet 1  . lidocaine-prilocaine (EMLA) cream APPLY TOPICALLY TO THE AFFECTED AREA AS NEEDED 30 g 2  . loperamide (IMODIUM A-D) 2 MG tablet Take 1 tablet (2 mg total) by mouth 4 (four) times daily as needed for diarrhea or loose stools. 30 tablet 0  . meloxicam (MOBIC) 7.5 MG tablet Take by mouth.    . metaxalone (SKELAXIN) 800 MG tablet Take 1 tablet (800 mg total) by mouth 3 (three) times daily as needed for muscle spasms. 60 tablet 1  . Multiple Vitamins-Minerals (AIRBORNE PO) Take 1 tablet by mouth daily.    . Multiple Vitamins-Minerals (MULTIVITAMIN GUMMIES ADULT PO) Take 1 each by mouth daily. Women's Vitafusion gummie    . triamcinolone (KENALOG) 0.025 % ointment Apply 1 application topically 2 (two) times daily. 30 g 1  . spironolactone (ALDACTONE) 25 MG tablet Take 1 tablet (25 mg total) by mouth daily. 90 tablet 3   No current facility-administered medications for this visit.   Facility-Administered Medications Ordered in Other Visits  Medication Dose Route Frequency Provider Last Rate Last Admin  . acetaminophen (TYLENOL) tablet 650 mg  650 mg Oral Once [name], MD      . acetaminophen (TYLENOL) tablet 650 mg  650 mg Oral Once [name], MD      . acetaminophen (TYLENOL) tablet 650 mg  650 mg Oral Once [name], MD      . heparin lock flush 100 unit/mL  500 Units Intracatheter Once PRN [name], MD      . sodium chloride 0.9 % injection 10 mL  10 mL Intracatheter PRN [name], MD   10 mL at 05/01/16 1622  . sodium chloride 0.9 % injection 10 mL  10 mL Intravenous PRN [name], MD      . sodium chloride 0.9 % injection 10 mL  10 mL Intracatheter PRN [name], MD   10 mL at 03/18/19 1715  . trastuzumab-hyaluronidase-oysk (HERCEPTIN HYLECTA) 600-10000 MG-UNT/5ML chemo SQ injection 600 mg  600 mg Subcutaneous Once [name], MD         PHYSICAL EXAMINATION: ECOG PERFORMANCE STATUS: 1 - Symptomatic but completely ambulatory  Vitals:   09/02/19 1310  BP: (!) 144/82  Pulse: 66  Resp: 20  Temp: 97.7 F (36.5 C)  SpO2: 100%   Filed Weights   09/02/19 1310  Weight: 220 lb 9.6 oz (100.1 kg)    GENERAL:alert, no distress and comfortable SKIN: no obvious rash  EYES: sclera clear NECK: without mass LUNGS: clear with normal breathing effort HEART: regular rate & rhythm, no lower extremity edema ABDOMEN: abdomen soft, non-tender and normal bowel sounds NEURO: alert & oriented x 3 with fluent speech, normal gait Breast exam deferred  PAC without erythema   LABORATORY DATA:  I have reviewed  the data as listed CBC Latest Ref Rng & Units 09/02/2019 08/12/2019 06/10/2019  WBC 4.0 - 10.5 K/uL 6.6 6.5 7.3  Hemoglobin 12.0 - 15.0 g/dL 12.8 12.3 12.4  Hematocrit 36.0 - 46.0 % 39.1 37.6 37.4  Platelets 150 - 400 K/uL 212 240 239     CMP Latest Ref Rng & Units 09/02/2019 08/12/2019 06/10/2019  Glucose 70 - 99 mg/dL 84 68(L) 125(H)  BUN 6 - 20 mg/dL 12 13 16   Creatinine 0.44 - 1.00 mg/dL 0.87 0.93 0.89  Sodium 135 - 145 mmol/L 139 142 142  Potassium 3.5 - 5.1 mmol/L 4.1 3.9 3.6  Chloride 98 - 111 mmol/L 103 105 103  CO2 22 - 32 mmol/L 28 26 26   Calcium 8.9 - 10.3 mg/dL 8.8(L) 9.5 9.2  Total Protein 6.5 - 8.1 g/dL 8.1 7.9 8.0  Total Bilirubin 0.3 - 1.2 mg/dL 0.3 0.4 0.3  Alkaline Phos 38 - 126 U/L 75 66 81  AST 15 - 41 U/L 24 18 18   ALT 0 - 44 U/L 30 20 22       RADIOGRAPHIC STUDIES: I have personally reviewed the radiological images as listed and agreed with the findings in the report. No results found.   ASSESSMENT & PLAN: IRAIDA CRAGIN is a 49 y.o. 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  tolerating well. -CT CAP 02/23/19 showed NED, she continues maintenance herceptin q3 weeks, recently changed to inj trastuzumab-hyaluronidase-oysk -mammogram on 07/22/19 was negative. She is clinically doing well  2. HTN -07/22/18 ECHO showed EF at 60-65%, GLS 22% -BP 144/82 today, stable -due for 6 mo echo  3. Intermittent Neuropathy  -Residual effects from her prior chemo treatment, more in feet than hands -I recommend OTC B complex and use warm compress and exercise more.  4. Obesity  -she has lost some weight lately, due to eating out less. She is doing a modified fast this month limiting certain types of food.  -I encouraged her to remain active   5. Irregular bleeding, h/o uterine polyps removed 2020 -She has had irregular periods since her chemo for breast cancer ended in 2016.  -s/p D&C 11/2018 with benign polyp  -she had a period in October 2020, none in  November, then again on 08/07/19 lasting 5 days.  -[facility] [facility] [place] [facility] and recommended close monitoring for now -next f/u in 2/21     Disposition:  Ms. BFinanis clinically doing well. She continues q3 week Herceptin just recently changed to hylecta injection. She tolerated well with injection site tenderness and hyperpigmentation and mild fatigue. She recovered well. CBC and CMP normal today. She will continue injection q3 weeks, with lab every other treatment. She is due for 6 month echo, I sent a message to Dr. BHaroldine Laws She is being referred for restaging scans before next visit in 12 weeks.   I recommend to continue supportive measures of sinus issues. She knows to call me if symptoms worsen over the next few days or she develops fever/chills, dyspnea; will start antibiotic at that time.   I recommend for her to consider getting the COVID19 vaccine when it becomes available to her. She will think about it.   No problem-specific Assessment & Plan  notes found for this encounter.   Orders  Placed This Encounter  Procedures  . CT Abdomen Pelvis W Contrast    Standing Status:   Future    Standing Expiration Date:   09/01/2020    Order Specific Question:   If indicated for the ordered procedure, I authorize the administration of contrast media per Radiology protocol    Answer:   Yes    Order Specific Question:   Is patient pregnant?    Answer:   No    Order Specific Question:   Preferred imaging location?    Answer:   [facility]    Order Specific Question:   Is Oral Contrast requested for this exam?    Answer:   Yes, Per Radiology protocol    Order Specific Question:   Radiology Contrast Protocol - do NOT remove file path    Answer:   \\[email or web address]  . CT Chest W Contrast    Standing Status:   Future    Standing Expiration Date:   09/01/2020    Order Specific Question:   If indicated for the ordered procedure, I authorize the administration of contrast media per Radiology protocol    Answer:   Yes    Order Specific Question:   Is patient pregnant?    Answer:   No    Order Specific Question:   Preferred imaging location?    Answer:   [facility]    Order Specific Question:   Radiology Contrast Protocol - do NOT remove file path    Answer:   \\[email or web address]   All questions were answered. The patient knows to call the clinic with any problems, questions or concerns. No barriers to learning was detected.     Alla Feeling, NP 09/02/19

## 2019-09-02 ENCOUNTER — Other Ambulatory Visit: Payer: Self-pay

## 2019-09-02 ENCOUNTER — Inpatient Hospital Stay: Payer: BC Managed Care – PPO | Attending: Hematology

## 2019-09-02 ENCOUNTER — Inpatient Hospital Stay: Payer: BC Managed Care – PPO

## 2019-09-02 ENCOUNTER — Ambulatory Visit: Payer: BC Managed Care – PPO | Admitting: Hematology

## 2019-09-02 ENCOUNTER — Ambulatory Visit: Payer: BC Managed Care – PPO

## 2019-09-02 ENCOUNTER — Other Ambulatory Visit: Payer: BC Managed Care – PPO

## 2019-09-02 ENCOUNTER — Inpatient Hospital Stay (HOSPITAL_BASED_OUTPATIENT_CLINIC_OR_DEPARTMENT_OTHER): Payer: BC Managed Care – PPO | Admitting: Nurse Practitioner

## 2019-09-02 ENCOUNTER — Encounter: Payer: Self-pay | Admitting: Nurse Practitioner

## 2019-09-02 VITALS — BP 144/82 | HR 66 | Temp 97.7°F | Resp 20 | Ht 70.0 in | Wt 220.6 lb

## 2019-09-02 DIAGNOSIS — C50311 Malignant neoplasm of lower-inner quadrant of right female breast: Secondary | ICD-10-CM | POA: Diagnosis present

## 2019-09-02 DIAGNOSIS — Z95828 Presence of other vascular implants and grafts: Secondary | ICD-10-CM

## 2019-09-02 DIAGNOSIS — C50511 Malignant neoplasm of lower-outer quadrant of right female breast: Secondary | ICD-10-CM

## 2019-09-02 DIAGNOSIS — Z171 Estrogen receptor negative status [ER-]: Secondary | ICD-10-CM | POA: Insufficient documentation

## 2019-09-02 DIAGNOSIS — C50911 Malignant neoplasm of unspecified site of right female breast: Secondary | ICD-10-CM

## 2019-09-02 DIAGNOSIS — Z5112 Encounter for antineoplastic immunotherapy: Secondary | ICD-10-CM | POA: Insufficient documentation

## 2019-09-02 DIAGNOSIS — E669 Obesity, unspecified: Secondary | ICD-10-CM | POA: Diagnosis not present

## 2019-09-02 DIAGNOSIS — I10 Essential (primary) hypertension: Secondary | ICD-10-CM | POA: Diagnosis not present

## 2019-09-02 DIAGNOSIS — C7800 Secondary malignant neoplasm of unspecified lung: Secondary | ICD-10-CM | POA: Insufficient documentation

## 2019-09-02 DIAGNOSIS — G620 Drug-induced polyneuropathy: Secondary | ICD-10-CM | POA: Diagnosis not present

## 2019-09-02 LAB — CBC WITH DIFFERENTIAL/PLATELET
Abs Immature Granulocytes: 0.01 10*3/uL (ref 0.00–0.07)
Basophils Absolute: 0 10*3/uL (ref 0.0–0.1)
Basophils Relative: 0 %
Eosinophils Absolute: 0.1 10*3/uL (ref 0.0–0.5)
Eosinophils Relative: 1 %
HCT: 39.1 % (ref 36.0–46.0)
Hemoglobin: 12.8 g/dL (ref 12.0–15.0)
Immature Granulocytes: 0 %
Lymphocytes Relative: 33 %
Lymphs Abs: 2.1 10*3/uL (ref 0.7–4.0)
MCH: 29.4 pg (ref 26.0–34.0)
MCHC: 32.7 g/dL (ref 30.0–36.0)
MCV: 89.7 fL (ref 80.0–100.0)
Monocytes Absolute: 0.6 10*3/uL (ref 0.1–1.0)
Monocytes Relative: 9 %
Neutro Abs: 3.8 10*3/uL (ref 1.7–7.7)
Neutrophils Relative %: 57 %
Platelets: 212 10*3/uL (ref 150–400)
RBC: 4.36 MIL/uL (ref 3.87–5.11)
RDW: 12.5 % (ref 11.5–15.5)
WBC: 6.6 10*3/uL (ref 4.0–10.5)
nRBC: 0 % (ref 0.0–0.2)

## 2019-09-02 LAB — COMPREHENSIVE METABOLIC PANEL
ALT: 30 U/L (ref 0–44)
AST: 24 U/L (ref 15–41)
Albumin: 3.9 g/dL (ref 3.5–5.0)
Alkaline Phosphatase: 75 U/L (ref 38–126)
Anion gap: 8 (ref 5–15)
BUN: 12 mg/dL (ref 6–20)
CO2: 28 mmol/L (ref 22–32)
Calcium: 8.8 mg/dL — ABNORMAL LOW (ref 8.9–10.3)
Chloride: 103 mmol/L (ref 98–111)
Creatinine, Ser: 0.87 mg/dL (ref 0.44–1.00)
GFR calc Af Amer: >60 mL/min (ref >60)
GFR calc non Af Amer: >60 mL/min (ref >60)
Glucose, Bld: 84 mg/dL (ref 70–99)
Potassium: 4.1 mmol/L (ref 3.5–5.1)
Sodium: 139 mmol/L (ref 135–145)
Total Bilirubin: 0.3 mg/dL (ref 0.3–1.2)
Total Protein: 8.1 g/dL (ref 6.5–8.1)

## 2019-09-02 MED ORDER — TRASTUZUMAB-HYALURONIDASE-OYSK 600-10000 MG-UNT/5ML ~~LOC~~ SOLN
600.0000 mg | Freq: Once | SUBCUTANEOUS | Status: AC
  Administered 2019-09-02: 15:00:00 600 mg via SUBCUTANEOUS
  Filled 2019-09-02: qty 5

## 2019-09-02 MED ORDER — SODIUM CHLORIDE 0.9% FLUSH
10.0000 mL | Freq: Once | INTRAVENOUS | Status: DC
  Filled 2019-09-02: qty 10

## 2019-09-02 MED ORDER — ACETAMINOPHEN 325 MG PO TABS: 650.0000 mg | ORAL_TABLET | Freq: Once | ORAL | Status: DC

## 2019-09-02 NOTE — Patient Instructions (Signed)
Trastuzumab; Hyaluronidase injection What is this medicine? TRASTUZUMAB; HYALURONIDASE (tras TOO zoo mab / hye al ur ON i dase) is used to treat breast cancer and stomach cancer. Trastuzumab is a monoclonal antibody. Hyaluronidase is used to improve the effects of trastuzumab. This medicine may be used for other purposes; ask your health care provider or pharmacist if you have questions. COMMON BRAND NAME(S): HERCEPTIN HYLECTA What should I tell my health care provider before I take this medicine? They need to know if you have any of these conditions:  heart disease  heart failure  lung or breathing disease, like asthma  an unusual or allergic reaction to trastuzumab, or other medications, foods, dyes, or preservatives  pregnant or trying to get pregnant  breast-feeding How should I use this medicine? This medicine is for injection under the skin. It is given by a health care professional in a hospital or clinic setting. Talk to your pediatrician regarding the use of this medicine in children. This medicine is not approved for use in children. Overdosage: If you think you have taken too much of this medicine contact a poison control center or emergency room at once. NOTE: This medicine is only for you. Do not share this medicine with others. What if I miss a dose? It is important not to miss a dose. Call your doctor or health care professional if you are unable to keep an appointment. What may interact with this medicine? This medicine may interact with the following medications:  certain types of chemotherapy, such as daunorubicin, doxorubicin, epirubicin, and idarubicin This list may not describe all possible interactions. Give your health care provider a list of all the medicines, herbs, non-prescription drugs, or dietary supplements you use. Also tell them if you smoke, drink alcohol, or use illegal drugs. Some items may interact with your medicine. What should I watch for while  using this medicine? Visit your doctor for checks on your progress. Report any side effects. Continue your course of treatment even though you feel ill unless your doctor tells you to stop. Call your doctor or health care professional for advice if you get a fever, chills or sore throat, or other symptoms of a cold or flu. Do not treat yourself. Try to avoid being around people who are sick. You may experience fever, chills and shaking during your first infusion. These effects are usually mild and can be treated with other medicines. Report any side effects during the infusion to your health care professional. Fever and chills usually do not happen with later infusions. Do not become pregnant while taking this medicine or for 7 months after stopping it. Women should inform their doctor if they wish to become pregnant or think they might be pregnant. Women of child-bearing potential will need to have a negative pregnancy test before starting this medicine. There is a potential for serious side effects to an unborn child. Talk to your health care professional or pharmacist for more information. Do not breast-feed an infant while taking this medicine or for 7 months after stopping it. What side effects may I notice from receiving this medicine? Side effects that you should report to your doctor or health care professional as soon as possible:  allergic reactions like skin rash, itching or hives, swelling of the face, lips, or tongue  breathing problems  chest pain or palpitations  cough  fever  general ill feeling or flu-like symptoms  signs of worsening heart failure like breathing problems; swelling in your legs and  feet Side effects that usually do not require medical attention (report these to your doctor or health care professional if they continue or are bothersome):  bone pain  changes in taste  diarrhea  joint pain  nausea/vomiting  unusually weak or tired  weight loss This  list may not describe all possible side effects. Call your doctor for medical advice about side effects. You may report side effects to FDA at 1-800-FDA-1088. Where should I keep my medicine? This drug is given in a hospital or clinic and will not be stored at home. NOTE: This sheet is a summary. It may not cover all possible information. If you have questions about this medicine, talk to your doctor, pharmacist, or health care provider.  2020 Elsevier/Gold Standard (2017-10-18 21:54:17)  Coronavirus (COVID-19) Are you at risk?  Are you at risk for the Coronavirus (COVID-19)?  To be considered HIGH RISK for Coronavirus (COVID-19), you have to meet the following criteria:  . Traveled to [place], [place], [place], [place] or [place]; or in the [place] to [place], [place], [place], or [place]; and have fever, cough, and shortness of breath within the last 2 weeks of travel OR . Been in close contact with a person diagnosed with COVID-19 within the last 2 weeks and have fever, cough, and shortness of breath . IF YOU DO NOT MEET THESE CRITERIA, YOU ARE CONSIDERED LOW RISK FOR COVID-19.  What to do if you are HIGH RISK for COVID-19?  Marland Kitchen If you are having a medical emergency, call 911. . Seek medical care right away. Before you go to a doctor's office, urgent care or emergency department, call ahead and tell them about your recent travel, contact with someone diagnosed with COVID-19, and your symptoms. You should receive instructions from your physician's office regarding next steps of care.  . When you arrive at healthcare provider, tell the healthcare staff immediately you have returned from visiting [place], [place], [place], [place] or [place]; or traveled in the [place] to [place], [place], [place], or [place]; in the last two weeks or you have been in close contact with a person diagnosed with COVID-19 in the last 2 weeks.   . Tell the health care staff about your  symptoms: fever, cough and shortness of breath. . After you have been seen by a medical provider, you will be either: o Tested for (COVID-19) and discharged home on quarantine except to seek medical care if symptoms worsen, and asked to  - Stay home and avoid contact with others until you get your results (4-5 days)  - Avoid travel on public transportation if possible (such as bus, train, or airplane) or o Sent to the Emergency Department by EMS for evaluation, COVID-19 testing, and possible admission depending on your condition and test results.  What to do if you are LOW RISK for COVID-19?  Reduce your risk of any infection by using the same precautions used for avoiding the common cold or flu:  Marland Kitchen Wash your hands often with soap and warm water for at least 20 seconds.  If soap and water are not readily available, use an alcohol-based hand sanitizer with at least 60% alcohol.  . If coughing or sneezing, cover your mouth and nose by coughing or sneezing into the elbow areas of your shirt or coat, into a tissue or into your sleeve (not your hands). . Avoid shaking hands with others and consider head nods or verbal greetings only. . Avoid  touching your eyes, nose, or mouth with unwashed hands.  . Avoid close contact with people who are sick. . Avoid places or events with large numbers of people in one location, like concerts or sporting events. . Carefully consider travel plans you have or are making. . If you are planning any travel outside or inside the [place], visit the CDC's Travelers' Health webpage for the latest health notices. . If you have some symptoms but not all symptoms, continue to monitor at home and seek medical attention if your symptoms worsen. . If you are having a medical emergency, call 911.   [place] / e-Visit: [email or web address]         [facility]:  [facility]: [phone]                   [facility]: [phone]

## 2019-09-03 ENCOUNTER — Telehealth: Payer: Self-pay | Admitting: Nurse Practitioner

## 2019-09-03 NOTE — Telephone Encounter (Signed)
Scheduled appt per 1/20 los.  Spoke with pt and she is aware of her appt dates and time.

## 2019-09-21 ENCOUNTER — Ambulatory Visit (HOSPITAL_BASED_OUTPATIENT_CLINIC_OR_DEPARTMENT_OTHER)
Admission: RE | Admit: 2019-09-21 | Discharge: 2019-09-21 | Disposition: A | Discharge status: Home / Self Care | Payer: BC Managed Care – PPO | Source: Ambulatory Visit | Attending: Internal Medicine | Admitting: Internal Medicine

## 2019-09-21 ENCOUNTER — Encounter (HOSPITAL_COMMUNITY): Payer: Self-pay | Admitting: Internal Medicine

## 2019-09-21 ENCOUNTER — Other Ambulatory Visit: Payer: Self-pay

## 2019-09-21 ENCOUNTER — Ambulatory Visit (HOSPITAL_COMMUNITY)
Admission: RE | Admit: 2019-09-21 | Discharge: 2019-09-21 | Disposition: A | Discharge status: Home / Self Care | Payer: BC Managed Care – PPO | Source: Ambulatory Visit | Attending: Cardiology | Admitting: Cardiology

## 2019-09-21 VITALS — BP 120/80 | HR 75 | Wt 222.4 lb

## 2019-09-21 DIAGNOSIS — I10 Essential (primary) hypertension: Secondary | ICD-10-CM | POA: Diagnosis not present

## 2019-09-21 DIAGNOSIS — C50911 Malignant neoplasm of unspecified site of right female breast: Secondary | ICD-10-CM | POA: Diagnosis present

## 2019-09-21 DIAGNOSIS — Z171 Estrogen receptor negative status [ER-]: Secondary | ICD-10-CM | POA: Diagnosis not present

## 2019-09-21 DIAGNOSIS — C7801 Secondary malignant neoplasm of right lung: Secondary | ICD-10-CM | POA: Insufficient documentation

## 2019-09-21 DIAGNOSIS — C50919 Malignant neoplasm of unspecified site of unspecified female breast: Secondary | ICD-10-CM

## 2019-09-21 DIAGNOSIS — Z888 Allergy status to other drugs, medicaments and biological substances status: Secondary | ICD-10-CM | POA: Diagnosis not present

## 2019-09-21 DIAGNOSIS — C7802 Secondary malignant neoplasm of left lung: Secondary | ICD-10-CM | POA: Diagnosis not present

## 2019-09-21 DIAGNOSIS — Z9049 Acquired absence of other specified parts of digestive tract: Secondary | ICD-10-CM | POA: Diagnosis not present

## 2019-09-21 DIAGNOSIS — Z9011 Acquired absence of right breast and nipple: Secondary | ICD-10-CM | POA: Diagnosis not present

## 2019-09-21 DIAGNOSIS — Z885 Allergy status to narcotic agent status: Secondary | ICD-10-CM | POA: Diagnosis not present

## 2019-09-21 DIAGNOSIS — Z803 Family history of malignant neoplasm of breast: Secondary | ICD-10-CM | POA: Insufficient documentation

## 2019-09-21 DIAGNOSIS — Z79899 Other long term (current) drug therapy: Secondary | ICD-10-CM | POA: Insufficient documentation

## 2019-09-21 DIAGNOSIS — Z923 Personal history of irradiation: Secondary | ICD-10-CM | POA: Diagnosis not present

## 2019-09-21 DIAGNOSIS — Z8042 Family history of malignant neoplasm of prostate: Secondary | ICD-10-CM | POA: Diagnosis not present

## 2019-09-21 DIAGNOSIS — Z800 Family history of malignant neoplasm of digestive organs: Secondary | ICD-10-CM | POA: Insufficient documentation

## 2019-09-21 DIAGNOSIS — Z9851 Tubal ligation status: Secondary | ICD-10-CM | POA: Diagnosis not present

## 2019-09-21 DIAGNOSIS — Z886 Allergy status to analgesic agent status: Secondary | ICD-10-CM | POA: Diagnosis not present

## 2019-09-21 NOTE — Patient Instructions (Signed)
Please call the office in August to schedule your follow up appointment and echocardiogram

## 2019-09-21 NOTE — Progress Notes (Signed)
Cardio-Oncology Clinic Note   Date:  09/21/2019   ID:  [name] [name], DOB [birth year], MRN [id number]  Location: Home  Provider location: [facility] Type of Visit: Established patient  PCP:  [name], PA  Cardiologist:  No primary care provider on file. 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 Mosetta Putt for enrollment into the cardio-oncology clinic for surveillance while receiving Herceptin (started 04/12/15 and on indefinitiely) who presents via audio/video conferencing for a telehealth visit today.     Continues to receive herceptin for metastatic breast CA (has been receiving Herceptin since 8/16. Last CT scan on 713/20 showed lungs remain clear). Works for [employer] for [employer] with [employer]. Remains active. No SOB, orthopnea or PND.     Echo 09/21/19 EF 60-65% GLS -21.9%  Echo 03/02/19  EF 55-60% GLS -18.2%   Echo 12/19 60-65% GLS -22.2% Personally reviewed  Echo 01/20/18. EF 55-60% LS' 10.4 cm/s GLS -20.7% Personally reviewed  Echo 06/20/17 EF 55-60% LS' not measured will  GLS -19.1   Past Medical History:  Diagnosis Date  . Breast cancer (HCC)   . Breast cancer (HCC)   . GERD (gastroesophageal reflux disease)    during pregnancy   . Pneumonia    hx of pneumonia 08/2013   . S/P radiation therapy 05/03/2015 through 06/14/2015    Right breast 4680 cGy in 26 sessions, right breast boost 1000 cGy in 5 sessions. Right supraclavicular/axillary region 4680 cGy with a supplemental PA field to bring the axillary dose up to 4500 cGy in 26 sessions   Past Surgical History:  Procedure Laterality Date  . BREAST LUMPECTOMY WITH RADIOACTIVE SEED AND SENTINEL LYMPH NODE BIOPSY Right 03/29/2015   Procedure: BREAST LUMPECTOMY WITH RADIOACTIVE SEED AND SENTINEL  LYMPH NODE BIOPSY;  Surgeon: Almond Lint, MD;  Location: [facility];  Service: General;  Laterality: Right;  . CESAREAN SECTION    . CHOLECYSTECTOMY    . ESSURE TUBAL LIGATION    . PORTACATH PLACEMENT Left 07/21/2014   Procedure: INSERTION PORT-A-CATH;  Surgeon: Almond Lint, MD;  Location: [facility];  Service: General;  Laterality: Left;  . WISDOM TOOTH EXTRACTION       Current Outpatient Medications  Medication Sig Dispense Refill  . acetaminophen (TYLENOL) 500 MG tablet Take 500 mg by mouth every 6 (six) hours as needed.    . Ascorbic Acid (VITAMIN C) 500 MG CHEW     . Calcium-Phosphorus-Vitamin D (CALCIUM GUMMIES PO) Take by mouth daily.    . Camphor-Eucalyptus-Menthol (VICKS VAPORUB EX) Apply 1 application topically at bedtime. Applies under nose, throat and on chest.    . cetirizine (ZYRTEC) 10 MG tablet Take 10 mg by mouth daily as needed for allergies (allergies).     . fluticasone (FLONASE) 50 MCG/ACT nasal spray Place into both nostrils daily.    . hydrocortisone 2.5 % cream Apply 1 application topically 2 (two) times daily as needed.    Marland Kitchen ibuprofen (ADVIL,MOTRIN) 800 MG tablet Take 1 tablet (800 mg total) by mouth every 8 (eight) hours as needed. 60 tablet 1  . lidocaine-prilocaine (EMLA) cream APPLY TOPICALLY TO THE AFFECTED AREA AS NEEDED 30 g 2  . loperamide (IMODIUM A-D) 2 MG tablet Take 1 tablet (2 mg total) by mouth 4 (four) times daily as needed for diarrhea or  loose stools. 30 tablet 0  . Multiple Vitamins-Minerals (MULTIVITAMIN GUMMIES ADULT PO) Take 1 each by mouth daily. Women's Vitafusion gummie    . spironolactone (ALDACTONE) 25 MG tablet Take 1 tablet (25 mg total) by mouth daily. 90 tablet 3  . triamcinolone (KENALOG) 0.025 % ointment Apply 1 application topically as needed.     No current facility-administered medications for this encounter.   Facility-Administered Medications Ordered in Other Encounters  Medication Dose Route Frequency Provider  Last Rate Last Admin  . acetaminophen (TYLENOL) tablet 650 mg  650 mg Oral Once Malachy Mood, MD      . acetaminophen (TYLENOL) tablet 650 mg  650 mg Oral Once Malachy Mood, MD      . heparin lock flush 100 unit/mL  500 Units Intracatheter Once PRN Malachy Mood, MD      . sodium chloride 0.9 % injection 10 mL  10 mL Intracatheter PRN Malachy Mood, MD   10 mL at 05/01/16 1622  . sodium chloride 0.9 % injection 10 mL  10 mL Intravenous PRN Malachy Mood, MD      . sodium chloride 0.9 % injection 10 mL  10 mL Intracatheter PRN Malachy Mood, MD   10 mL at 03/18/19 1715    Allergies:   Adhesive [tape], Aspirin, and Codeine   Social History:  The patient  reports that she has never smoked. She has never used smokeless tobacco. She reports current alcohol use. She reports that she does not use drugs.   Family History:  The patient's family history includes Breast cancer (age of onset: 84) in her maternal aunt; Breast cancer (age of onset: 17) in her mother; Breast cancer (age of onset: 23) in her cousin; Breast cancer (age of onset: 91) in her maternal aunt; Liver cancer in her father and paternal grandmother; Prostate cancer in her paternal grandfather; Prostate cancer (age of onset: 57) in her maternal uncle; Prostate cancer (age of onset: 51) in her maternal uncle; Prostate cancer (age of onset: 78) in her maternal grandfather.   ROS:  Please see the history of present illness.   All other systems are personally reviewed and negative.    Vitals:   09/21/19 1400  BP: 120/80  Pulse: 75  SpO2: 99%  Weight: 100.9 kg (222 lb 6.4 oz)    Exam:  General:  Well appearing. No resp difficulty HEENT: normal Neck: supple. no JVD. Carotids 2+ bilat; no bruits. No lymphadenopathy or thryomegaly appreciated. Cor: PMI nondisplaced. Regular rate & rhythm. No rubs, gallops or murmurs. Lungs: clear Abdomen: soft, nontender, nondistended. No hepatosplenomegaly. No bruits or masses. Good bowel sounds. Extremities: no cyanosis,  clubbing, rash, edema Neuro: alert & orientedx3, cranial nerves grossly intact. moves all 4 extremities w/o difficulty. Affect pleasant   Recent Labs: 09/02/2019: ALT 30; BUN 12; Creatinine, Ser 0.87; Hemoglobin 12.8; Platelets 212; Potassium 4.1; Sodium 139  Personally reviewed   Wt Readings from Last 3 Encounters:  09/21/19 100.9 kg (222 lb 6.4 oz)  09/02/19 100.1 kg (220 lb 9.6 oz)  08/12/19 101.6 kg (224 lb)      ASSESSMENT AND PLAN:  1. R breast invasive ductal carcinoma, T3N1M1, stage IV, ER-/PR-/HER2+, with metastases to b/l lungs, biopsy confirmed - I reviewed echos personally. EF and Doppler parameters stable. No HF on exam. Continue Herceptin.  - repeat echos in 6 months  2. HTN - Blood pressure well controlled. Continue current regimen.  Arvilla Meres MD 07/22/2018       Advanced [facility]  Clinic [facility] [address] [facility] [place] [address] [phone] (office) [phone] (fax)

## 2019-09-21 NOTE — Progress Notes (Signed)
  Echocardiogram 2D Echocardiogram has been performed.  Bobbye Charleston 09/21/2019, 1:53 PM

## 2019-09-23 ENCOUNTER — Inpatient Hospital Stay: Payer: BC Managed Care – PPO | Attending: Hematology

## 2019-09-23 ENCOUNTER — Other Ambulatory Visit: Payer: Self-pay

## 2019-09-23 ENCOUNTER — Ambulatory Visit: Payer: BC Managed Care – PPO

## 2019-09-23 VITALS — BP 152/73 | HR 73 | Temp 98.7°F | Resp 18 | Wt 225.4 lb

## 2019-09-23 DIAGNOSIS — Z5112 Encounter for antineoplastic immunotherapy: Secondary | ICD-10-CM | POA: Diagnosis present

## 2019-09-23 DIAGNOSIS — C50911 Malignant neoplasm of unspecified site of right female breast: Secondary | ICD-10-CM

## 2019-09-23 DIAGNOSIS — C50311 Malignant neoplasm of lower-inner quadrant of right female breast: Secondary | ICD-10-CM | POA: Insufficient documentation

## 2019-09-23 DIAGNOSIS — C7800 Secondary malignant neoplasm of unspecified lung: Secondary | ICD-10-CM

## 2019-09-23 MED ORDER — ACETAMINOPHEN 325 MG PO TABS: 650.0000 mg | ORAL_TABLET | Freq: Once | ORAL | Status: DC

## 2019-09-23 MED ORDER — TRASTUZUMAB-HYALURONIDASE-OYSK 600-10000 MG-UNT/5ML ~~LOC~~ SOLN
600.0000 mg | Freq: Once | SUBCUTANEOUS | Status: AC
  Administered 2019-09-23: 16:00:00 600 mg via SUBCUTANEOUS
  Filled 2019-09-23: qty 5

## 2019-09-23 NOTE — Patient Instructions (Signed)
Trastuzumab; Hyaluronidase injection What is this medicine? TRASTUZUMAB; HYALURONIDASE (tras TOO zoo mab / hye al ur ON i dase) is used to treat breast cancer and stomach cancer. Trastuzumab is a monoclonal antibody. Hyaluronidase is used to improve the effects of trastuzumab. This medicine may be used for other purposes; ask your health care provider or pharmacist if you have questions. COMMON BRAND NAME(S): HERCEPTIN HYLECTA What should I tell my health care provider before I take this medicine? They need to know if you have any of these conditions:  heart disease  heart failure  lung or breathing disease, like asthma  an unusual or allergic reaction to trastuzumab, or other medications, foods, dyes, or preservatives  pregnant or trying to get pregnant  breast-feeding How should I use this medicine? This medicine is for injection under the skin. It is given by a health care professional in a hospital or clinic setting. Talk to your pediatrician regarding the use of this medicine in children. This medicine is not approved for use in children. Overdosage: If you think you have taken too much of this medicine contact a poison control center or emergency room at once. NOTE: This medicine is only for you. Do not share this medicine with others. What if I miss a dose? It is important not to miss a dose. Call your doctor or health care professional if you are unable to keep an appointment. What may interact with this medicine? This medicine may interact with the following medications:  certain types of chemotherapy, such as daunorubicin, doxorubicin, epirubicin, and idarubicin This list may not describe all possible interactions. Give your health care provider a list of all the medicines, herbs, non-prescription drugs, or dietary supplements you use. Also tell them if you smoke, drink alcohol, or use illegal drugs. Some items may interact with your medicine. What should I watch for while  using this medicine? Visit your doctor for checks on your progress. Report any side effects. Continue your course of treatment even though you feel ill unless your doctor tells you to stop. Call your doctor or health care professional for advice if you get a fever, chills or sore throat, or other symptoms of a cold or flu. Do not treat yourself. Try to avoid being around people who are sick. You may experience fever, chills and shaking during your first infusion. These effects are usually mild and can be treated with other medicines. Report any side effects during the infusion to your health care professional. Fever and chills usually do not happen with later infusions. Do not become pregnant while taking this medicine or for 7 months after stopping it. Women should inform their doctor if they wish to become pregnant or think they might be pregnant. Women of child-bearing potential will need to have a negative pregnancy test before starting this medicine. There is a potential for serious side effects to an unborn child. Talk to your health care professional or pharmacist for more information. Do not breast-feed an infant while taking this medicine or for 7 months after stopping it. What side effects may I notice from receiving this medicine? Side effects that you should report to your doctor or health care professional as soon as possible:  allergic reactions like skin rash, itching or hives, swelling of the face, lips, or tongue  breathing problems  chest pain or palpitations  cough  fever  general ill feeling or flu-like symptoms  signs of worsening heart failure like breathing problems; swelling in your legs and  feet Side effects that usually do not require medical attention (report these to your doctor or health care professional if they continue or are bothersome):  bone pain  changes in taste  diarrhea  joint pain  nausea/vomiting  unusually weak or tired  weight loss This  list may not describe all possible side effects. Call your doctor for medical advice about side effects. You may report side effects to FDA at 1-800-FDA-1088. Where should I keep my medicine? This drug is given in a hospital or clinic and will not be stored at home. NOTE: This sheet is a summary. It may not cover all possible information. If you have questions about this medicine, talk to your doctor, pharmacist, or health care provider.  2020 Elsevier/Gold Standard (2017-10-18 21:54:17)  Coronavirus (COVID-19) Are you at risk?  Are you at risk for the Coronavirus (COVID-19)?  To be considered HIGH RISK for Coronavirus (COVID-19), you have to meet the following criteria:  . Traveled to [place], [place], [place], [place] or [place]; or in the [place] to [place], [place], [place], or [place]; and have fever, cough, and shortness of breath within the last 2 weeks of travel OR . Been in close contact with a person diagnosed with COVID-19 within the last 2 weeks and have fever, cough, and shortness of breath . IF YOU DO NOT MEET THESE CRITERIA, YOU ARE CONSIDERED LOW RISK FOR COVID-19.  What to do if you are HIGH RISK for COVID-19?  Marland Kitchen If you are having a medical emergency, call 911. . Seek medical care right away. Before you go to a doctor's office, urgent care or emergency department, call ahead and tell them about your recent travel, contact with someone diagnosed with COVID-19, and your symptoms. You should receive instructions from your physician's office regarding next steps of care.  . When you arrive at healthcare provider, tell the healthcare staff immediately you have returned from visiting [place], [place], [place], [place] or [place]; or traveled in the [place] to [place], [place], [place], or [place]; in the last two weeks or you have been in close contact with a person diagnosed with COVID-19 in the last 2 weeks.   . Tell the health care staff about your  symptoms: fever, cough and shortness of breath. . After you have been seen by a medical provider, you will be either: o Tested for (COVID-19) and discharged home on quarantine except to seek medical care if symptoms worsen, and asked to  - Stay home and avoid contact with others until you get your results (4-5 days)  - Avoid travel on public transportation if possible (such as bus, train, or airplane) or o Sent to the Emergency Department by EMS for evaluation, COVID-19 testing, and possible admission depending on your condition and test results.  What to do if you are LOW RISK for COVID-19?  Reduce your risk of any infection by using the same precautions used for avoiding the common cold or flu:  Marland Kitchen Wash your hands often with soap and warm water for at least 20 seconds.  If soap and water are not readily available, use an alcohol-based hand sanitizer with at least 60% alcohol.  . If coughing or sneezing, cover your mouth and nose by coughing or sneezing into the elbow areas of your shirt or coat, into a tissue or into your sleeve (not your hands). . Avoid shaking hands with others and consider head nods or verbal greetings only. . Avoid  touching your eyes, nose, or mouth with unwashed hands.  . Avoid close contact with people who are sick. . Avoid places or events with large numbers of people in one location, like concerts or sporting events. . Carefully consider travel plans you have or are making. . If you are planning any travel outside or inside the [place], visit the CDC's Travelers' Health webpage for the latest health notices. . If you have some symptoms but not all symptoms, continue to monitor at home and seek medical attention if your symptoms worsen. . If you are having a medical emergency, call 911.   [place] / e-Visit: [email or web address]         [facility]:  [facility]: [phone]                   [facility]: [phone]

## 2019-10-08 ENCOUNTER — Other Ambulatory Visit: Payer: Self-pay | Admitting: Hematology

## 2019-10-14 ENCOUNTER — Ambulatory Visit: Payer: BC Managed Care – PPO

## 2019-10-14 ENCOUNTER — Inpatient Hospital Stay: Payer: BC Managed Care – PPO | Attending: Hematology

## 2019-10-14 ENCOUNTER — Other Ambulatory Visit: Payer: BC Managed Care – PPO

## 2019-10-14 ENCOUNTER — Inpatient Hospital Stay: Payer: BC Managed Care – PPO

## 2019-10-14 ENCOUNTER — Other Ambulatory Visit: Payer: Self-pay

## 2019-10-14 VITALS — BP 139/75 | HR 80 | Temp 99.2°F | Resp 18 | Wt 216.0 lb

## 2019-10-14 DIAGNOSIS — C50911 Malignant neoplasm of unspecified site of right female breast: Secondary | ICD-10-CM

## 2019-10-14 DIAGNOSIS — Z5112 Encounter for antineoplastic immunotherapy: Secondary | ICD-10-CM | POA: Diagnosis present

## 2019-10-14 DIAGNOSIS — C7800 Secondary malignant neoplasm of unspecified lung: Secondary | ICD-10-CM

## 2019-10-14 DIAGNOSIS — Z95828 Presence of other vascular implants and grafts: Secondary | ICD-10-CM

## 2019-10-14 DIAGNOSIS — C50311 Malignant neoplasm of lower-inner quadrant of right female breast: Secondary | ICD-10-CM | POA: Diagnosis present

## 2019-10-14 LAB — COMPREHENSIVE METABOLIC PANEL
ALT: 22 U/L (ref 0–44)
AST: 19 U/L (ref 15–41)
Albumin: 3.8 g/dL (ref 3.5–5.0)
Alkaline Phosphatase: 78 U/L (ref 38–126)
Anion gap: 5 (ref 5–15)
BUN: 16 mg/dL (ref 6–20)
CO2: 29 mmol/L (ref 22–32)
Calcium: 9.2 mg/dL (ref 8.9–10.3)
Chloride: 104 mmol/L (ref 98–111)
Creatinine, Ser: 0.98 mg/dL (ref 0.44–1.00)
GFR calc Af Amer: >60 mL/min (ref >60)
GFR calc non Af Amer: >60 mL/min (ref >60)
Glucose, Bld: 120 mg/dL — ABNORMAL HIGH (ref 70–99)
Potassium: 4 mmol/L (ref 3.5–5.1)
Sodium: 138 mmol/L (ref 135–145)
Total Bilirubin: 0.3 mg/dL (ref 0.3–1.2)
Total Protein: 8.2 g/dL — ABNORMAL HIGH (ref 6.5–8.1)

## 2019-10-14 LAB — CBC WITH DIFFERENTIAL/PLATELET
Abs Immature Granulocytes: 0.02 10*3/uL (ref 0.00–0.07)
Basophils Absolute: 0 10*3/uL (ref 0.0–0.1)
Basophils Relative: 1 %
Eosinophils Absolute: 0.1 10*3/uL (ref 0.0–0.5)
Eosinophils Relative: 1 %
HCT: 38.9 % (ref 36.0–46.0)
Hemoglobin: 12.6 g/dL (ref 12.0–15.0)
Immature Granulocytes: 0 %
Lymphocytes Relative: 29 %
Lymphs Abs: 1.9 10*3/uL (ref 0.7–4.0)
MCH: 29.2 pg (ref 26.0–34.0)
MCHC: 32.4 g/dL (ref 30.0–36.0)
MCV: 90 fL (ref 80.0–100.0)
Monocytes Absolute: 0.5 10*3/uL (ref 0.1–1.0)
Monocytes Relative: 7 %
Neutro Abs: 4 10*3/uL (ref 1.7–7.7)
Neutrophils Relative %: 62 %
Platelets: 272 10*3/uL (ref 150–400)
RBC: 4.32 MIL/uL (ref 3.87–5.11)
RDW: 13 % (ref 11.5–15.5)
WBC: 6.5 10*3/uL (ref 4.0–10.5)
nRBC: 0 % (ref 0.0–0.2)

## 2019-10-14 MED ORDER — TRASTUZUMAB-HYALURONIDASE-OYSK 600-10000 MG-UNT/5ML ~~LOC~~ SOLN
600.0000 mg | Freq: Once | SUBCUTANEOUS | Status: AC
  Administered 2019-10-14: 600 mg via SUBCUTANEOUS
  Filled 2019-10-14: qty 5

## 2019-10-14 MED ORDER — SODIUM CHLORIDE 0.9% FLUSH
10.0000 mL | Freq: Once | INTRAVENOUS | Status: AC
  Administered 2019-10-14: 10 mL
  Filled 2019-10-14: qty 10

## 2019-10-14 MED ORDER — ACETAMINOPHEN 325 MG PO TABS: 650.0000 mg | ORAL_TABLET | Freq: Once | ORAL | Status: DC

## 2019-10-14 MED ORDER — HEPARIN SOD (PORK) LOCK FLUSH 100 UNIT/ML IV SOLN
500.0000 [IU] | Freq: Once | INTRAVENOUS | Status: AC | PRN
  Administered 2019-10-14: 500 [IU] via INTRAVENOUS
  Filled 2019-10-14: qty 5

## 2019-10-14 NOTE — Patient Instructions (Signed)
Trastuzumab; Hyaluronidase injection What is this medicine? TRASTUZUMAB; HYALURONIDASE (tras TOO zoo mab / hye al ur ON i dase) is used to treat breast cancer and stomach cancer. Trastuzumab is a monoclonal antibody. Hyaluronidase is used to improve the effects of trastuzumab. This medicine may be used for other purposes; ask your health care provider or pharmacist if you have questions. COMMON BRAND NAME(S): HERCEPTIN HYLECTA What should I tell my health care provider before I take this medicine? They need to know if you have any of these conditions:  heart disease  heart failure  lung or breathing disease, like asthma  an unusual or allergic reaction to trastuzumab, or other medications, foods, dyes, or preservatives  pregnant or trying to get pregnant  breast-feeding How should I use this medicine? This medicine is for injection under the skin. It is given by a health care professional in a hospital or clinic setting. Talk to your pediatrician regarding the use of this medicine in children. This medicine is not approved for use in children. Overdosage: If you think you have taken too much of this medicine contact a poison control center or emergency room at once. NOTE: This medicine is only for you. Do not share this medicine with others. What if I miss a dose? It is important not to miss a dose. Call your doctor or health care professional if you are unable to keep an appointment. What may interact with this medicine? This medicine may interact with the following medications:  certain types of chemotherapy, such as daunorubicin, doxorubicin, epirubicin, and idarubicin This list may not describe all possible interactions. Give your health care provider a list of all the medicines, herbs, non-prescription drugs, or dietary supplements you use. Also tell them if you smoke, drink alcohol, or use illegal drugs. Some items may interact with your medicine. What should I watch for while  using this medicine? Visit your doctor for checks on your progress. Report any side effects. Continue your course of treatment even though you feel ill unless your doctor tells you to stop. Call your doctor or health care professional for advice if you get a fever, chills or sore throat, or other symptoms of a cold or flu. Do not treat yourself. Try to avoid being around people who are sick. You may experience fever, chills and shaking during your first infusion. These effects are usually mild and can be treated with other medicines. Report any side effects during the infusion to your health care professional. Fever and chills usually do not happen with later infusions. Do not become pregnant while taking this medicine or for 7 months after stopping it. Women should inform their doctor if they wish to become pregnant or think they might be pregnant. Women of child-bearing potential will need to have a negative pregnancy test before starting this medicine. There is a potential for serious side effects to an unborn child. Talk to your health care professional or pharmacist for more information. Do not breast-feed an infant while taking this medicine or for 7 months after stopping it. What side effects may I notice from receiving this medicine? Side effects that you should report to your doctor or health care professional as soon as possible:  allergic reactions like skin rash, itching or hives, swelling of the face, lips, or tongue  breathing problems  chest pain or palpitations  cough  fever  general ill feeling or flu-like symptoms  signs of worsening heart failure like breathing problems; swelling in your legs and  feet Side effects that usually do not require medical attention (report these to your doctor or health care professional if they continue or are bothersome):  bone pain  changes in taste  diarrhea  joint pain  nausea/vomiting  unusually weak or tired  weight loss This  list may not describe all possible side effects. Call your doctor for medical advice about side effects. You may report side effects to FDA at 1-800-FDA-1088. Where should I keep my medicine? This drug is given in a hospital or clinic and will not be stored at home. NOTE: This sheet is a summary. It may not cover all possible information. If you have questions about this medicine, talk to your doctor, pharmacist, or health care provider.  2020 Elsevier/Gold Standard (2017-10-18 21:54:17)  Coronavirus (COVID-19) Are you at risk?  Are you at risk for the Coronavirus (COVID-19)?  To be considered HIGH RISK for Coronavirus (COVID-19), you have to meet the following criteria:  . Traveled to [place], [place], [place], [place] or [place]; or in the [place] to [place], [place], [place], or [place]; and have fever, cough, and shortness of breath within the last 2 weeks of travel OR . Been in close contact with a person diagnosed with COVID-19 within the last 2 weeks and have fever, cough, and shortness of breath . IF YOU DO NOT MEET THESE CRITERIA, YOU ARE CONSIDERED LOW RISK FOR COVID-19.  What to do if you are HIGH RISK for COVID-19?  Marland Kitchen If you are having a medical emergency, call 911. . Seek medical care right away. Before you go to a doctor's office, urgent care or emergency department, call ahead and tell them about your recent travel, contact with someone diagnosed with COVID-19, and your symptoms. You should receive instructions from your physician's office regarding next steps of care.  . When you arrive at healthcare provider, tell the healthcare staff immediately you have returned from visiting [place], [place], [place], [place] or [place]; or traveled in the [place] to [place], [place], [place], or [place]; in the last two weeks or you have been in close contact with a person diagnosed with COVID-19 in the last 2 weeks.   . Tell the health care staff about your  symptoms: fever, cough and shortness of breath. . After you have been seen by a medical provider, you will be either: o Tested for (COVID-19) and discharged home on quarantine except to seek medical care if symptoms worsen, and asked to  - Stay home and avoid contact with others until you get your results (4-5 days)  - Avoid travel on public transportation if possible (such as bus, train, or airplane) or o Sent to the Emergency Department by EMS for evaluation, COVID-19 testing, and possible admission depending on your condition and test results.  What to do if you are LOW RISK for COVID-19?  Reduce your risk of any infection by using the same precautions used for avoiding the common cold or flu:  Marland Kitchen Wash your hands often with soap and warm water for at least 20 seconds.  If soap and water are not readily available, use an alcohol-based hand sanitizer with at least 60% alcohol.  . If coughing or sneezing, cover your mouth and nose by coughing or sneezing into the elbow areas of your shirt or coat, into a tissue or into your sleeve (not your hands). . Avoid shaking hands with others and consider head nods or verbal greetings only. . Avoid  touching your eyes, nose, or mouth with unwashed hands.  . Avoid close contact with people who are sick. . Avoid places or events with large numbers of people in one location, like concerts or sporting events. . Carefully consider travel plans you have or are making. . If you are planning any travel outside or inside the [place], visit the CDC's Travelers' Health webpage for the latest health notices. . If you have some symptoms but not all symptoms, continue to monitor at home and seek medical attention if your symptoms worsen. . If you are having a medical emergency, call 911.   [place] / e-Visit: [email or web address]         [facility]:  [facility]: [phone]                   [facility]: [phone]

## 2019-10-14 NOTE — Progress Notes (Signed)
Patient stated that she was not getting infusion today but was getting the herceptin through injection. Port deaccessed.

## 2019-10-29 NOTE — Progress Notes (Signed)
Pharmacist Chemotherapy Monitoring - Follow Up Assessment    I verify that I have reviewed each item in the below checklist:  . Regimen for the patient is scheduled for the appropriate day and plan matches scheduled date. Marland Kitchen Appropriate non-routine labs are ordered dependent on drug ordered. . If applicable, additional medications reviewed and ordered per protocol based on lifetime cumulative doses and/or treatment regimen.   Plan for follow-up and/or issues identified: No . I-vent associated with next due treatment: No  [name] [name] 10/29/2019 2:04 PM

## 2019-11-04 ENCOUNTER — Other Ambulatory Visit: Payer: Self-pay

## 2019-11-04 ENCOUNTER — Inpatient Hospital Stay: Payer: BC Managed Care – PPO

## 2019-11-04 ENCOUNTER — Ambulatory Visit: Payer: BC Managed Care – PPO

## 2019-11-04 VITALS — BP 145/69 | HR 76 | Temp 98.3°F | Resp 18

## 2019-11-04 DIAGNOSIS — Z5112 Encounter for antineoplastic immunotherapy: Secondary | ICD-10-CM | POA: Diagnosis not present

## 2019-11-04 DIAGNOSIS — C50911 Malignant neoplasm of unspecified site of right female breast: Secondary | ICD-10-CM

## 2019-11-04 MED ORDER — ACETAMINOPHEN 325 MG PO TABS: 650.0000 mg | ORAL_TABLET | Freq: Once | ORAL | Status: DC

## 2019-11-04 MED ORDER — TRASTUZUMAB-HYALURONIDASE-OYSK 600-10000 MG-UNT/5ML ~~LOC~~ SOLN
600.0000 mg | Freq: Once | SUBCUTANEOUS | Status: AC
  Administered 2019-11-04: 600 mg via SUBCUTANEOUS
  Filled 2019-11-04: qty 5

## 2019-11-04 NOTE — Patient Instructions (Signed)
Trastuzumab; Hyaluronidase injection What is this medicine? TRASTUZUMAB; HYALURONIDASE (tras TOO zoo mab / hye al ur ON i dase) is used to treat breast cancer and stomach cancer. Trastuzumab is a monoclonal antibody. Hyaluronidase is used to improve the effects of trastuzumab. This medicine may be used for other purposes; ask your health care provider or pharmacist if you have questions. COMMON BRAND NAME(S): HERCEPTIN HYLECTA What should I tell my health care provider before I take this medicine? They need to know if you have any of these conditions:  heart disease  heart failure  lung or breathing disease, like asthma  an unusual or allergic reaction to trastuzumab, or other medications, foods, dyes, or preservatives  pregnant or trying to get pregnant  breast-feeding How should I use this medicine? This medicine is for injection under the skin. It is given by a health care professional in a hospital or clinic setting. Talk to your pediatrician regarding the use of this medicine in children. This medicine is not approved for use in children. Overdosage: If you think you have taken too much of this medicine contact a poison control center or emergency room at once. NOTE: This medicine is only for you. Do not share this medicine with others. What if I miss a dose? It is important not to miss a dose. Call your doctor or health care professional if you are unable to keep an appointment. What may interact with this medicine? This medicine may interact with the following medications:  certain types of chemotherapy, such as daunorubicin, doxorubicin, epirubicin, and idarubicin This list may not describe all possible interactions. Give your health care provider a list of all the medicines, herbs, non-prescription drugs, or dietary supplements you use. Also tell them if you smoke, drink alcohol, or use illegal drugs. Some items may interact with your medicine. What should I watch for while  using this medicine? Visit your doctor for checks on your progress. Report any side effects. Continue your course of treatment even though you feel ill unless your doctor tells you to stop. Call your doctor or health care professional for advice if you get a fever, chills or sore throat, or other symptoms of a cold or flu. Do not treat yourself. Try to avoid being around people who are sick. You may experience fever, chills and shaking during your first infusion. These effects are usually mild and can be treated with other medicines. Report any side effects during the infusion to your health care professional. Fever and chills usually do not happen with later infusions. Do not become pregnant while taking this medicine or for 7 months after stopping it. Women should inform their doctor if they wish to become pregnant or think they might be pregnant. Women of child-bearing potential will need to have a negative pregnancy test before starting this medicine. There is a potential for serious side effects to an unborn child. Talk to your health care professional or pharmacist for more information. Do not breast-feed an infant while taking this medicine or for 7 months after stopping it. What side effects may I notice from receiving this medicine? Side effects that you should report to your doctor or health care professional as soon as possible:  allergic reactions like skin rash, itching or hives, swelling of the face, lips, or tongue  breathing problems  chest pain or palpitations  cough  fever  general ill feeling or flu-like symptoms  signs of worsening heart failure like breathing problems; swelling in your legs and  feet Side effects that usually do not require medical attention (report these to your doctor or health care professional if they continue or are bothersome):  bone pain  changes in taste  diarrhea  joint pain  nausea/vomiting  unusually weak or tired  weight loss This  list may not describe all possible side effects. Call your doctor for medical advice about side effects. You may report side effects to FDA at 1-800-FDA-1088. Where should I keep my medicine? This drug is given in a hospital or clinic and will not be stored at home. NOTE: This sheet is a summary. It may not cover all possible information. If you have questions about this medicine, talk to your doctor, pharmacist, or health care provider.  2020 Elsevier/Gold Standard (2017-10-18 21:54:17)

## 2019-11-12 DIAGNOSIS — I10 Essential (primary) hypertension: Secondary | ICD-10-CM | POA: Insufficient documentation

## 2019-11-20 NOTE — Progress Notes (Signed)
Pharmacist Chemotherapy Monitoring - Follow Up Assessment    I verify that I have reviewed each item in the below checklist:  . Regimen for the patient is scheduled for the appropriate day and plan matches scheduled date. Marland Kitchen Appropriate non-routine labs are ordered dependent on drug ordered. . If applicable, additional medications reviewed and ordered per protocol based on lifetime cumulative doses and/or treatment regimen.   Plan for follow-up and/or issues identified: No . I-vent associated with next due treatment: No  Domonic Kimball D 11/20/2019 4:15 PM

## 2019-11-26 ENCOUNTER — Inpatient Hospital Stay: Payer: BC Managed Care – PPO

## 2019-11-26 ENCOUNTER — Inpatient Hospital Stay: Payer: BC Managed Care – PPO | Attending: Hematology

## 2019-11-26 ENCOUNTER — Ambulatory Visit: Payer: BC Managed Care – PPO | Admitting: Hematology

## 2019-11-26 ENCOUNTER — Other Ambulatory Visit: Payer: Self-pay

## 2019-11-26 VITALS — BP 152/85 | HR 76 | Temp 98.5°F | Resp 16

## 2019-11-26 DIAGNOSIS — C50911 Malignant neoplasm of unspecified site of right female breast: Secondary | ICD-10-CM

## 2019-11-26 DIAGNOSIS — C50311 Malignant neoplasm of lower-inner quadrant of right female breast: Secondary | ICD-10-CM | POA: Diagnosis present

## 2019-11-26 DIAGNOSIS — Z5112 Encounter for antineoplastic immunotherapy: Secondary | ICD-10-CM | POA: Insufficient documentation

## 2019-11-26 DIAGNOSIS — C7800 Secondary malignant neoplasm of unspecified lung: Secondary | ICD-10-CM

## 2019-11-26 DIAGNOSIS — Z95828 Presence of other vascular implants and grafts: Secondary | ICD-10-CM

## 2019-11-26 LAB — CBC WITH DIFFERENTIAL/PLATELET
Abs Immature Granulocytes: 0.01 10*3/uL (ref 0.00–0.07)
Basophils Absolute: 0 10*3/uL (ref 0.0–0.1)
Basophils Relative: 1 %
Eosinophils Absolute: 0.1 10*3/uL (ref 0.0–0.5)
Eosinophils Relative: 3 %
HCT: 38 % (ref 36.0–46.0)
Hemoglobin: 12.3 g/dL (ref 12.0–15.0)
Immature Granulocytes: 0 %
Lymphocytes Relative: 34 %
Lymphs Abs: 1.2 10*3/uL (ref 0.7–4.0)
MCH: 29.5 pg (ref 26.0–34.0)
MCHC: 32.4 g/dL (ref 30.0–36.0)
MCV: 91.1 fL (ref 80.0–100.0)
Monocytes Absolute: 0.5 10*3/uL (ref 0.1–1.0)
Monocytes Relative: 14 %
Neutro Abs: 1.8 10*3/uL (ref 1.7–7.7)
Neutrophils Relative %: 48 %
Platelets: 194 10*3/uL (ref 150–400)
RBC: 4.17 MIL/uL (ref 3.87–5.11)
RDW: 13.3 % (ref 11.5–15.5)
WBC: 3.7 10*3/uL — ABNORMAL LOW (ref 4.0–10.5)
nRBC: 0 % (ref 0.0–0.2)

## 2019-11-26 LAB — COMPREHENSIVE METABOLIC PANEL
ALT: 19 U/L (ref 0–44)
AST: 22 U/L (ref 15–41)
Albumin: 3.6 g/dL (ref 3.5–5.0)
Alkaline Phosphatase: 65 U/L (ref 38–126)
Anion gap: 10 (ref 5–15)
BUN: 11 mg/dL (ref 6–20)
CO2: 27 mmol/L (ref 22–32)
Calcium: 9.1 mg/dL (ref 8.9–10.3)
Chloride: 104 mmol/L (ref 98–111)
Creatinine, Ser: 1.03 mg/dL — ABNORMAL HIGH (ref 0.44–1.00)
GFR calc Af Amer: >60 mL/min (ref >60)
GFR calc non Af Amer: >60 mL/min (ref >60)
Glucose, Bld: 122 mg/dL — ABNORMAL HIGH (ref 70–99)
Potassium: 3.6 mmol/L (ref 3.5–5.1)
Sodium: 141 mmol/L (ref 135–145)
Total Bilirubin: 0.7 mg/dL (ref 0.3–1.2)
Total Protein: 7.9 g/dL (ref 6.5–8.1)

## 2019-11-26 MED ORDER — SODIUM CHLORIDE 0.9 % IJ SOLN
10.0000 mL | INTRAMUSCULAR | Status: DC | PRN
  Filled 2019-11-26: qty 10

## 2019-11-26 MED ORDER — HEPARIN SOD (PORK) LOCK FLUSH 100 UNIT/ML IV SOLN
500.0000 [IU] | Freq: Once | INTRAVENOUS | Status: AC
  Filled 2019-11-26: qty 5

## 2019-11-26 MED ORDER — ACETAMINOPHEN 325 MG PO TABS
ORAL_TABLET | ORAL | Status: AC
  Filled 2019-11-26: qty 2

## 2019-11-26 MED ORDER — SODIUM CHLORIDE 0.9 % IV SOLN
Freq: Once | INTRAVENOUS | Status: DC
  Filled 2019-11-26: qty 250

## 2019-11-26 MED ORDER — HEPARIN SOD (PORK) LOCK FLUSH 100 UNIT/ML IV SOLN
500.0000 [IU] | Freq: Once | INTRAVENOUS | Status: DC | PRN
  Filled 2019-11-26: qty 5

## 2019-11-26 MED ORDER — ACETAMINOPHEN 325 MG PO TABS: 650.0000 mg | ORAL_TABLET | Freq: Once | ORAL | Status: DC

## 2019-11-26 MED ORDER — TRASTUZUMAB-HYALURONIDASE-OYSK 600-10000 MG-UNT/5ML ~~LOC~~ SOLN
600.0000 mg | Freq: Once | SUBCUTANEOUS | Status: AC
  Administered 2019-11-26: 600 mg via SUBCUTANEOUS
  Filled 2019-11-26: qty 5

## 2019-11-26 MED ORDER — SODIUM CHLORIDE 0.9% FLUSH
10.0000 mL | INTRAVENOUS | Status: AC | PRN
  Filled 2019-11-26: qty 10

## 2019-11-26 NOTE — Patient Instructions (Signed)
[facility] Discharge Instructions for Patients Receiving Chemotherapy  Today you received the following chemotherapy agents trastuzumab.  To help prevent nausea and vomiting after your treatment, we encourage you to take your nausea medication as directed.    If you develop nausea and vomiting that is not controlled by your nausea medication, call the clinic.   BELOW ARE SYMPTOMS THAT SHOULD BE REPORTED IMMEDIATELY:  *FEVER GREATER THAN 100.5 F  *CHILLS WITH OR WITHOUT FEVER  NAUSEA AND VOMITING THAT IS NOT CONTROLLED WITH YOUR NAUSEA MEDICATION  *UNUSUAL SHORTNESS OF BREATH  *UNUSUAL BRUISING OR BLEEDING  TENDERNESS IN MOUTH AND THROAT WITH OR WITHOUT PRESENCE OF ULCERS  *URINARY PROBLEMS  *BOWEL PROBLEMS  UNUSUAL RASH Items with * indicate a potential emergency and should be followed up as soon as possible.  Feel free to call the clinic should you have any questions or concerns. The clinic phone number is [phone].  Please show the CHEMO ALERT CARD at check-in to the Emergency Department and triage nurse.   

## 2019-12-01 ENCOUNTER — Telehealth: Payer: Self-pay | Admitting: Hematology

## 2019-12-01 NOTE — Telephone Encounter (Signed)
Called to confirm appts scheduled per 4/15 sch message- left message for pt with appt date and time

## 2019-12-03 ENCOUNTER — Ambulatory Visit (HOSPITAL_COMMUNITY): Admission: RE | Admit: 2019-12-03 | Payer: BC Managed Care – PPO | Source: Ambulatory Visit

## 2019-12-08 ENCOUNTER — Encounter (HOSPITAL_COMMUNITY): Payer: Self-pay

## 2019-12-08 ENCOUNTER — Other Ambulatory Visit: Payer: Self-pay

## 2019-12-08 ENCOUNTER — Ambulatory Visit (HOSPITAL_COMMUNITY)
Admission: RE | Admit: 2019-12-08 | Discharge: 2019-12-08 | Disposition: A | Discharge status: Home / Self Care | Payer: BC Managed Care – PPO | Source: Ambulatory Visit | Attending: Nurse Practitioner | Admitting: Nurse Practitioner

## 2019-12-08 DIAGNOSIS — C50511 Malignant neoplasm of lower-outer quadrant of right female breast: Secondary | ICD-10-CM | POA: Insufficient documentation

## 2019-12-08 DIAGNOSIS — Z171 Estrogen receptor negative status [ER-]: Secondary | ICD-10-CM | POA: Insufficient documentation

## 2019-12-08 MED ORDER — SODIUM CHLORIDE (PF) 0.9 % IJ SOLN
INTRAMUSCULAR | Status: AC
  Filled 2019-12-08: qty 50

## 2019-12-08 MED ORDER — IOHEXOL 300 MG/ML  SOLN
100.0000 mL | Freq: Once | INTRAMUSCULAR | Status: AC | PRN
  Administered 2019-12-08: 09:00:00 100 mL via INTRAVENOUS

## 2019-12-11 NOTE — Progress Notes (Signed)
Pharmacist Chemotherapy Monitoring - Follow Up Assessment    I verify that I have reviewed each item in the below checklist:  . Regimen for the patient is scheduled for the appropriate day and plan matches scheduled date. Marland Kitchen Appropriate non-routine labs are ordered dependent on drug ordered. . If applicable, additional medications reviewed and ordered per protocol based on lifetime cumulative doses and/or treatment regimen.   Plan for follow-up and/or issues identified: No . I-vent associated with next due treatment: No . MD and/or nursing notified: No  Philomena Course 12/11/2019 11:14 AM

## 2019-12-16 NOTE — Progress Notes (Signed)
[facility]   [phone] [phone]   Clinic Follow up Note   Patient Care Team: Virginia Rochester, [place] as PCP - General (Family Medicine) [name], DO (Family Medicine) Holley Bouche, NP (Inactive) as Nurse Practitioner (Nurse Practitioner) Stark Klein, MD as Consulting Physician (General Surgery) Arloa Koh, MD (Inactive) as Consulting Physician (Radiation Oncology) [name], MD as Consulting Physician (Hematology) Clydie Braun, MD (Obstetrics and Gynecology)  Date of Service:  12/17/2019  CHIEF COMPLAINT: F/u right breast cancer   SUMMARY OF ONCOLOGIC HISTORY: Oncology History Overview Note  Breast cancer 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   Staging form: Breast, AJCC 7th Edition     Clinical stage from 07/22/2014: Stage IV (T3, N1, M1) - Unsigned     Breast cancer 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 ([place])  07/02/2014 Mammogram   Mammogram showed a 2cm right beast mass and a 1.8cm right axillary node. MRI breast on 07/16/2014 showed 7cm R breast lesion and 4.4cm r axillary node    07/02/2014 Imaging   CT CAP: a 4.7cm mass in LUL lung and a 2.1cm mas in RML, and a small nodule in RUL, suspecious for metastasis     07/09/2014 Initial Diagnosis   right IDA with b/l lung lesions, ER-/PR-/HER2+   07/09/2014 Initial Biopsy   US guided right breast mass and axillary node biopsy showed IDA, and DCIS, ER-/PR-/HER2+   07/26/2014 Pathologic Stage   Left lung mass by IR, path revealed high grade carcinoma, morphology similar to breast tumor biopsy, TTF(-), NapsinA(-), ER(-)   08/04/2014 - 03/22/2015 Chemotherapy   weekly Paclitaxel 2m/m2, trastuzumab and pertuzumab every 3 weeks   08/30/2014 Genetic Testing   BreastNext panel was negative. 17 genes including BRCA1, BRCA2, were negative for mutations.    10/04/2014 Imaging   Interval decrease in the right axillary lymphadenopathy. Bilateral pulmonary lesions with left hilar lymphadenopathy also markedly  decreased in the interval. The left hilar lymphadenopathy has resolved.   12/20/2014 Imaging   restaging CT showed stable disease, no new lesions    03/29/2015 Pathology Results    right breast lumpectomy showed  chemotherapy treatment effect,  a 1 mm residual tumor,   margins were widely negative, 5 sentinel lymph nodes and 2 axillary lymph nodes were negative.    03/29/2015 Surgery    right breast lumpectomy and sentinel lymph node biopsy  by Dr. BBarry Dienes  04/12/2015 -  Chemotherapy   Herceptin maintenance therapy , 6 mg/kg, every 3 weeks since 04/12/15. Switched to Herceptin Hylecta injection q3weeks after 06/10/19.   05/03/2015 - 06/14/2015 Radiation Therapy   right breast adjuvant irradiation by Dr. MValere Dross   08/28/2016 Imaging   CT CAP w Contrast  IMPRESSION: 1. No acute process or evidence of metastatic disease within the chest, abdomen, or pelvis. 2. Similar to less well-defined left upper lobe density, likely scarring. No evidence of new or progressive pulmonary metastasis. 3. Right nephrolithiasis.   01/22/2017 Imaging   CT Chest W Contrast 01/22/17 IMPRESSION: 1. Stable exam. No new or progressive findings. No evidence for metastatic disease. 2. Left upper lobe architectural distortion/scarring is stable. 3. Nonobstructing right renal stone.   07/31/2017 Imaging   Bone Scan Whole Body 07/31/17  IMPRESSION: 1. No scintigraphic evidence of osseous metastatic disease. 2. Thoracolumbar scoliosis.    07/31/2017 Imaging   CT CAP W Contrast 07/31/17 IMPRESSION: 1. No findings of active or recurrent malignancy. 2. Other imaging findings of potential clinical significance: Aortic Atherosclerosis (ICD10-I70.0). Mild cardiomegaly. Postoperative and radiation  therapy findings in the right chest. Nonobstructive right nephrolithiasis. Thoracolumbar scoliosis.    01/20/2018 Echocardiogram   ECHO 01/20/2018 Study Conclusions  - Left ventricle: The cavity size was normal. Wall  thickness was   normal. Systolic function was normal. The estimated ejection   fraction was in the range of 55% to 60%. Wall motion was normal;   there were no regional wall motion abnormalities. Doppler   parameters are consistent with abnormal left ventricular   relaxation (grade 1 diastolic dysfunction). - Impressions: GLS -20.7% LS&' 10.4 cm.    02/17/2018 Imaging   02/17/2018 CT CAP IMPRESSION: 1. No evidence for residual or recurrent tumor or metastatic disease. 2.  Aortic Atherosclerosis (ICD10-I70.0). 3. Right renal calculi. 4. Scoliosis.   08/11/2018 Imaging   CT CAP W Contrast 08/11/18  IMPRESSION: No evidence for localized recurrence or metastatic disease in the chest, abdomen or pelvis.   02/23/2019 Imaging   CT CAP W Contrast  IMPRESSION: 1. No findings of active malignancy in the chest, abdomen, or pelvis. 2. 3 mm right kidney lower pole nonobstructive renal calculus. 3. Thoracolumbar scoliosis.   12/08/2019 Imaging   CT CAP W contrast  IMPRESSION: 1. Unchanged post treatment appearance of the lungs. No evidence of recurrent or new metastatic disease in the chest, abdomen, or pelvis.   2. Postoperative findings of right lumpectomy and axillary lymph node dissection.   3.  Nonobstructive right nephrolithiasis.      CURRENT THERAPY:  MaintenanceHerceptin every 3 weeks since 04/12/15. Switched to Herceptin Hylecta injection q3weeks after 06/10/19.  INTERVAL HISTORY:  [name] [name] is here for a follow up of right breast cancer. She notes she is doing well with no new changes. She denies issues with her kidney stone. She notes having mild allergy issues, but manageable.  She notes working from home has been manageable. She has gotten both her COVID19 vaccines. She notes she is tolerating Herceptin injections well. She notes she was able to get several bra prosthesis at [employer] that fit her.    REVIEW OF SYSTEMS:   Constitutional: Denies fevers,  chills or abnormal weight loss Eyes: Denies blurriness of vision Ears, nose, mouth, throat, and face: Denies mucositis or sore throat Respiratory: Denies cough, dyspnea or wheezes Cardiovascular: Denies palpitation, chest discomfort or lower extremity swelling Gastrointestinal:  Denies nausea, heartburn or change in bowel habits Skin: Denies abnormal skin rashes Lymphatics: Denies new lymphadenopathy or easy bruising Neurological:Denies numbness, tingling or new weaknesses Behavioral/Psych: Mood is stable, no new changes  All other systems were reviewed with the patient and are negative.  MEDICAL HISTORY:  Past Medical History:  Diagnosis Date  . Breast cancer ([place])   . Breast cancer ([place])   . GERD (gastroesophageal reflux disease)    during pregnancy   . Pneumonia    hx of pneumonia 08/2013   . S/P radiation therapy 05/03/2015 through 06/14/2015    Right breast 4680 cGy in 26 sessions, right breast boost 1000 cGy in 5 sessions. Right supraclavicular/axillary region 4680 cGy with a supplemental PA field to bring the axillary dose up to 4500 cGy in 26 sessions    SURGICAL HISTORY: Past Surgical History:  Procedure Laterality Date  . BREAST LUMPECTOMY WITH RADIOACTIVE SEED AND SENTINEL LYMPH NODE BIOPSY Right 03/29/2015   Procedure: BREAST LUMPECTOMY WITH RADIOACTIVE SEED AND SENTINEL LYMPH NODE BIOPSY;  Surgeon: Stark Klein, MD;  Location: [facility];  Service: General;  Laterality: Right;  . CESAREAN SECTION    .  CHOLECYSTECTOMY    . ESSURE TUBAL LIGATION    . PORTACATH PLACEMENT Left 07/21/2014   Procedure: INSERTION PORT-A-CATH;  Surgeon: Stark Klein, MD;  Location: [facility];  Service: General;  Laterality: Left;  . WISDOM TOOTH EXTRACTION      I have reviewed the social history and family history with the patient and they are unchanged from previous note.  ALLERGIES:  is allergic to adhesive [tape]; aspirin; and  codeine.  MEDICATIONS:  Current Outpatient Medications  Medication Sig Dispense Refill  . acetaminophen (TYLENOL) 500 MG tablet Take 500 mg by mouth every 6 (six) hours as needed.    . Ascorbic Acid (VITAMIN C) 500 MG CHEW     . Calcium-Phosphorus-Vitamin D (CALCIUM GUMMIES PO) Take by mouth daily.    . Camphor-Eucalyptus-Menthol (VICKS VAPORUB EX) Apply 1 application topically at bedtime. Applies under nose, throat and on chest.    . cetirizine (ZYRTEC) 10 MG tablet Take 10 mg by mouth daily as needed for allergies (allergies).     . fluticasone (FLONASE) 50 MCG/ACT nasal spray Place into both nostrils daily.    . hydrocortisone 2.5 % cream Apply 1 application topically 2 (two) times daily as needed.    Marland Kitchen ibuprofen (ADVIL,MOTRIN) 800 MG tablet Take 1 tablet (800 mg total) by mouth every 8 (eight) hours as needed. 60 tablet 1  . lidocaine-prilocaine (EMLA) cream APPLY TOPICALLY TO THE AFFECTED AREA AS NEEDED 30 g 2  . loperamide (IMODIUM A-D) 2 MG tablet Take 1 tablet (2 mg total) by mouth 4 (four) times daily as needed for diarrhea or loose stools. 30 tablet 0  . misoprostol (CYTOTEC) 200 MCG tablet Take 400 mcg by mouth 2 (two) times daily.    . Multiple Vitamins-Minerals (MULTIVITAMIN GUMMIES ADULT PO) Take 1 each by mouth daily. Women's Vitafusion gummie    . spironolactone (ALDACTONE) 25 MG tablet Take 1 tablet (25 mg total) by mouth daily. 90 tablet 3  . triamcinolone (KENALOG) 0.025 % ointment Apply 1 application topically as needed.     No current facility-administered medications for this visit.   Facility-Administered Medications Ordered in Other Visits  Medication Dose Route Frequency Provider Last Rate Last Admin  . acetaminophen (TYLENOL) tablet 650 mg  650 mg Oral Once [name], MD      . acetaminophen (TYLENOL) tablet 650 mg  650 mg Oral Once [name], MD      . heparin lock flush 100 unit/mL  500 Units Intracatheter Once PRN [name], MD      . sodium chloride 0.9 %  injection 10 mL  10 mL Intracatheter PRN [name], MD   10 mL at 05/01/16 1622  . sodium chloride 0.9 % injection 10 mL  10 mL Intravenous PRN [name], MD      . sodium chloride 0.9 % injection 10 mL  10 mL Intracatheter PRN [name], MD   10 mL at 03/18/19 1715  . sodium chloride flush (NS) 0.9 % injection 10 mL  10 mL Intravenous PRN [name], MD        PHYSICAL EXAMINATION: ECOG PERFORMANCE STATUS: 0 - Asymptomatic  Vitals:   12/17/19 1429  BP: 139/79  Pulse: 73  Resp: 18  Temp: 99.1 F (37.3 C)  SpO2: 100%   Filed Weights   12/17/19 1429  Weight: 224 lb 6.4 oz (101.8 kg)    Due to COVID19 we will limit examination to appearance. Patient had no complaints.  GENERAL:alert, no distress and comfortable  SKIN: skin color normal, no rashes or significant lesions EYES: normal, Conjunctiva are pink and non-injected, sclera clear  NEURO: alert & oriented x 3 with fluent speech   LABORATORY DATA:  I have reviewed the data as listed CBC Latest Ref Rng & Units 11/26/2019 10/14/2019 09/02/2019  WBC 4.0 - 10.5 K/uL 3.7(L) 6.5 6.6  Hemoglobin 12.0 - 15.0 g/dL 12.3 12.6 12.8  Hematocrit 36.0 - 46.0 % 38.0 38.9 39.1  Platelets 150 - 400 K/uL 194 272 212     CMP Latest Ref Rng & Units 11/26/2019 10/14/2019 09/02/2019  Glucose 70 - 99 mg/dL 122(H) 120(H) 84  BUN 6 - 20 mg/dL 11 16 12   Creatinine 0.44 - 1.00 mg/dL 1.03(H) 0.98 0.87  Sodium 135 - 145 mmol/L 141 138 139  Potassium 3.5 - 5.1 mmol/L 3.6 4.0 4.1  Chloride 98 - 111 mmol/L 104 104 103  CO2 22 - 32 mmol/L 27 29 28   Calcium 8.9 - 10.3 mg/dL 9.1 9.2 8.8(L)  Total Protein 6.5 - 8.1 g/dL 7.9 8.2(H) 8.1  Total Bilirubin 0.3 - 1.2 mg/dL 0.7 0.3 0.3  Alkaline Phos 38 - 126 U/L 65 78 75  AST 15 - 41 U/L 22 19 24   ALT 0 - 44 U/L 19 22 30       RADIOGRAPHIC STUDIES: I have personally reviewed the radiological images as listed and agreed with the findings in the report. No results found.   ASSESSMENT & PLAN:  MYLINH CRAGG  is a 49 y.o. 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 Switched to Herceptin Hylecta injection q3weeks after 06/10/19. Tolerating well.  -She underwent hysteroscopy with D&C on 11/27/18 to remove benign polyps that were recently found. -I personally reviewed and discussed her CT CAP from 12/08/19 with pt which showed stable post treatment appearance of the lungs. No evidence of recurrence or new metastatic disease.  -She will proceed with Herceptin today and continue every 3 weeks.  -Given she is very stable will monitor her labs and flush with every 3 treatments.  -She has been on Herceptin for almost 5 years. I discussed after 5 years we will repeat scan annually and ECHOs every 6 months. I discussed her case is unusual given her prologed disease free status although she is still at risk for recurrence. I discussed that there is no date to support stopping Benlysta Herceptin given the initial metastatic disease.  However I may consider stopping her Herceptin if she remains NED after 10 years. She is agreeable.  -Since she is doing very well, will space out her labs to every 9 weeks, follow-up to every 4 months, and scab every 8 to 12 months  -F/u in 15 weeks     2. HTN -On losartangivingby Dr. BHaroldine Lawsin June 2019, dose increasedin 08/2018. -07/22/18 ECHO showed EF at 60-65%, GLS 22%  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 -Residual neuropathy remains stable and intermittent.Not mentioned today, likely manageable.   4. Obesity  -I encourage pt to watch her diet, exercise and try to loose some weight -She did lose weight, but gained some of this back. I encouraged her to  remain active and eat healthy diet.  5. Vaginal spotting and sever cramping -She has had irregular periods since  her chemo for breast cancer ended in 2016.  -She did have uterine polyps removed in early 2020. After 4 months of no periods, she had 1 episode of vaginal spotting which started and ended with severe cramping. This has not recurred since.  -I advised she sees her Gyn for exam and possible Pelvic US evaluation. She is agreeable.   6. Cancer Screenings  -I encouraged her to continue yearly mammograms and regular Pap Smears -She can start screening colonoscopy this year.  -I also encouraged her to f/u with PCP.    Plan -scan reviewed, NED -Will proceed with Herceptin today -Herceptin Hylecta injection every 3 weeks X5  -Lab and flush in 6 and 15 weeks  -f/u in 15 weeks   No problem-specific Assessment & Plan notes found for this encounter.   No orders of the defined types were placed in this encounter.  All questions were answered. The patient knows to call the clinic with any problems, questions or concerns. No barriers to learning was detected. The total time spent in the appointment was 30 minutes.     [name], MD 12/17/2019   I, Joslyn Devon, am acting as scribe for [name], MD.   I have reviewed the above documentation for accuracy and completeness, and I agree with the above.

## 2019-12-17 ENCOUNTER — Encounter: Payer: Self-pay | Admitting: Hematology

## 2019-12-17 ENCOUNTER — Inpatient Hospital Stay (HOSPITAL_BASED_OUTPATIENT_CLINIC_OR_DEPARTMENT_OTHER): Payer: BC Managed Care – PPO | Admitting: Hematology

## 2019-12-17 ENCOUNTER — Inpatient Hospital Stay: Payer: BC Managed Care – PPO | Attending: Hematology

## 2019-12-17 ENCOUNTER — Other Ambulatory Visit: Payer: Self-pay

## 2019-12-17 VITALS — BP 139/79 | HR 73 | Temp 99.1°F | Resp 18 | Ht 70.0 in | Wt 224.4 lb

## 2019-12-17 DIAGNOSIS — C50911 Malignant neoplasm of unspecified site of right female breast: Secondary | ICD-10-CM | POA: Diagnosis not present

## 2019-12-17 DIAGNOSIS — E669 Obesity, unspecified: Secondary | ICD-10-CM | POA: Diagnosis not present

## 2019-12-17 DIAGNOSIS — I10 Essential (primary) hypertension: Secondary | ICD-10-CM | POA: Diagnosis not present

## 2019-12-17 DIAGNOSIS — C7800 Secondary malignant neoplasm of unspecified lung: Secondary | ICD-10-CM | POA: Diagnosis not present

## 2019-12-17 DIAGNOSIS — Z5112 Encounter for antineoplastic immunotherapy: Secondary | ICD-10-CM | POA: Diagnosis not present

## 2019-12-17 DIAGNOSIS — C50311 Malignant neoplasm of lower-inner quadrant of right female breast: Secondary | ICD-10-CM | POA: Diagnosis present

## 2019-12-17 DIAGNOSIS — G629 Polyneuropathy, unspecified: Secondary | ICD-10-CM | POA: Insufficient documentation

## 2019-12-17 DIAGNOSIS — Z171 Estrogen receptor negative status [ER-]: Secondary | ICD-10-CM | POA: Diagnosis not present

## 2019-12-17 MED ORDER — ACETAMINOPHEN 325 MG PO TABS: 650.0000 mg | ORAL_TABLET | Freq: Once | ORAL | Status: DC

## 2019-12-17 MED ORDER — ACETAMINOPHEN 325 MG PO TABS
ORAL_TABLET | ORAL | Status: AC
  Filled 2019-12-17: qty 2

## 2019-12-17 MED ORDER — TRASTUZUMAB-HYALURONIDASE-OYSK 600-10000 MG-UNT/5ML ~~LOC~~ SOLN
600.0000 mg | Freq: Once | SUBCUTANEOUS | Status: AC
  Administered 2019-12-17: 600 mg via SUBCUTANEOUS
  Filled 2019-12-17: qty 5

## 2019-12-17 NOTE — Patient Instructions (Signed)
[facility] Discharge Instructions for Patients Receiving Chemotherapy  Today you received the following chemotherapy agents trastuzumab.  To help prevent nausea and vomiting after your treatment, we encourage you to take your nausea medication as directed.    If you develop nausea and vomiting that is not controlled by your nausea medication, call the clinic.   BELOW ARE SYMPTOMS THAT SHOULD BE REPORTED IMMEDIATELY:  *FEVER GREATER THAN 100.5 F  *CHILLS WITH OR WITHOUT FEVER  NAUSEA AND VOMITING THAT IS NOT CONTROLLED WITH YOUR NAUSEA MEDICATION  *UNUSUAL SHORTNESS OF BREATH  *UNUSUAL BRUISING OR BLEEDING  TENDERNESS IN MOUTH AND THROAT WITH OR WITHOUT PRESENCE OF ULCERS  *URINARY PROBLEMS  *BOWEL PROBLEMS  UNUSUAL RASH Items with * indicate a potential emergency and should be followed up as soon as possible.  Feel free to call the clinic should you have any questions or concerns. The clinic phone number is [phone].  Please show the CHEMO ALERT CARD at check-in to the Emergency Department and triage nurse.   

## 2019-12-18 ENCOUNTER — Telehealth: Payer: Self-pay | Admitting: Hematology

## 2019-12-18 NOTE — Telephone Encounter (Signed)
Scheduled appt per 5/6 los.  Spoke with pt and she is aware of the appt date and time.

## 2020-01-06 ENCOUNTER — Inpatient Hospital Stay: Payer: BC Managed Care – PPO

## 2020-01-06 ENCOUNTER — Other Ambulatory Visit: Payer: Self-pay

## 2020-01-06 VITALS — BP 154/83 | HR 81 | Temp 98.8°F | Resp 18

## 2020-01-06 DIAGNOSIS — C7800 Secondary malignant neoplasm of unspecified lung: Secondary | ICD-10-CM

## 2020-01-06 DIAGNOSIS — Z5112 Encounter for antineoplastic immunotherapy: Secondary | ICD-10-CM | POA: Diagnosis not present

## 2020-01-06 MED ORDER — TRASTUZUMAB-HYALURONIDASE-OYSK 600-10000 MG-UNT/5ML ~~LOC~~ SOLN
600.0000 mg | Freq: Once | SUBCUTANEOUS | Status: AC
  Administered 2020-01-06: 600 mg via SUBCUTANEOUS
  Filled 2020-01-06: qty 5

## 2020-01-06 MED ORDER — ACETAMINOPHEN 325 MG PO TABS: 650.0000 mg | ORAL_TABLET | Freq: Once | ORAL | Status: DC

## 2020-01-06 MED ORDER — ACETAMINOPHEN 325 MG PO TABS
ORAL_TABLET | ORAL | Status: AC
  Filled 2020-01-06: qty 2

## 2020-01-27 ENCOUNTER — Other Ambulatory Visit: Payer: Self-pay

## 2020-01-27 ENCOUNTER — Inpatient Hospital Stay: Payer: BC Managed Care – PPO

## 2020-01-27 ENCOUNTER — Inpatient Hospital Stay: Payer: BC Managed Care – PPO | Attending: Hematology

## 2020-01-27 VITALS — BP 153/73 | HR 90 | Temp 98.9°F | Resp 18 | Wt 224.2 lb

## 2020-01-27 DIAGNOSIS — Z5112 Encounter for antineoplastic immunotherapy: Secondary | ICD-10-CM | POA: Diagnosis not present

## 2020-01-27 DIAGNOSIS — C50911 Malignant neoplasm of unspecified site of right female breast: Secondary | ICD-10-CM

## 2020-01-27 DIAGNOSIS — C7800 Secondary malignant neoplasm of unspecified lung: Secondary | ICD-10-CM

## 2020-01-27 DIAGNOSIS — Z95828 Presence of other vascular implants and grafts: Secondary | ICD-10-CM

## 2020-01-27 DIAGNOSIS — C50311 Malignant neoplasm of lower-inner quadrant of right female breast: Secondary | ICD-10-CM | POA: Diagnosis present

## 2020-01-27 LAB — CBC WITH DIFFERENTIAL/PLATELET
Abs Immature Granulocytes: 0.01 10*3/uL (ref 0.00–0.07)
Basophils Absolute: 0 10*3/uL (ref 0.0–0.1)
Basophils Relative: 0 %
Eosinophils Absolute: 0.1 10*3/uL (ref 0.0–0.5)
Eosinophils Relative: 1 %
HCT: 36.2 % (ref 36.0–46.0)
Hemoglobin: 11.8 g/dL — ABNORMAL LOW (ref 12.0–15.0)
Immature Granulocytes: 0 %
Lymphocytes Relative: 26 %
Lymphs Abs: 2 10*3/uL (ref 0.7–4.0)
MCH: 29.6 pg (ref 26.0–34.0)
MCHC: 32.6 g/dL (ref 30.0–36.0)
MCV: 91 fL (ref 80.0–100.0)
Monocytes Absolute: 0.6 10*3/uL (ref 0.1–1.0)
Monocytes Relative: 7 %
Neutro Abs: 5 10*3/uL (ref 1.7–7.7)
Neutrophils Relative %: 66 %
Platelets: 229 10*3/uL (ref 150–400)
RBC: 3.98 MIL/uL (ref 3.87–5.11)
RDW: 13.2 % (ref 11.5–15.5)
WBC: 7.7 10*3/uL (ref 4.0–10.5)
nRBC: 0 % (ref 0.0–0.2)

## 2020-01-27 LAB — COMPREHENSIVE METABOLIC PANEL
ALT: 12 U/L (ref 0–44)
AST: 12 U/L — ABNORMAL LOW (ref 15–41)
Albumin: 3.5 g/dL (ref 3.5–5.0)
Alkaline Phosphatase: 71 U/L (ref 38–126)
Anion gap: 9 (ref 5–15)
BUN: 16 mg/dL (ref 6–20)
CO2: 26 mmol/L (ref 22–32)
Calcium: 8.8 mg/dL — ABNORMAL LOW (ref 8.9–10.3)
Chloride: 104 mmol/L (ref 98–111)
Creatinine, Ser: 0.99 mg/dL (ref 0.44–1.00)
GFR calc Af Amer: >60 mL/min (ref >60)
GFR calc non Af Amer: >60 mL/min (ref >60)
Glucose, Bld: 120 mg/dL — ABNORMAL HIGH (ref 70–99)
Potassium: 3.8 mmol/L (ref 3.5–5.1)
Sodium: 139 mmol/L (ref 135–145)
Total Bilirubin: 0.3 mg/dL (ref 0.3–1.2)
Total Protein: 7.6 g/dL (ref 6.5–8.1)

## 2020-01-27 MED ORDER — SODIUM CHLORIDE 0.9% FLUSH
10.0000 mL | Freq: Once | INTRAVENOUS | Status: AC
  Administered 2020-01-27: 10 mL
  Filled 2020-01-27: qty 10

## 2020-01-27 MED ORDER — ACETAMINOPHEN 325 MG PO TABS
ORAL_TABLET | ORAL | Status: AC
  Filled 2020-01-27: qty 2

## 2020-01-27 MED ORDER — ACETAMINOPHEN 325 MG PO TABS: 650.0000 mg | ORAL_TABLET | Freq: Once | ORAL | Status: DC

## 2020-01-27 MED ORDER — TRASTUZUMAB-HYALURONIDASE-OYSK 600-10000 MG-UNT/5ML ~~LOC~~ SOLN
600.0000 mg | Freq: Once | SUBCUTANEOUS | Status: AC
  Administered 2020-01-27: 600 mg via SUBCUTANEOUS
  Filled 2020-01-27: qty 5

## 2020-01-27 NOTE — Patient Instructions (Signed)
[facility] Discharge Instructions for Patients Receiving Chemotherapy  Today you received the following chemotherapy agents trastuzumab.  To help prevent nausea and vomiting after your treatment, we encourage you to take your nausea medication as directed.    If you develop nausea and vomiting that is not controlled by your nausea medication, call the clinic.   BELOW ARE SYMPTOMS THAT SHOULD BE REPORTED IMMEDIATELY:  *FEVER GREATER THAN 100.5 F  *CHILLS WITH OR WITHOUT FEVER  NAUSEA AND VOMITING THAT IS NOT CONTROLLED WITH YOUR NAUSEA MEDICATION  *UNUSUAL SHORTNESS OF BREATH  *UNUSUAL BRUISING OR BLEEDING  TENDERNESS IN MOUTH AND THROAT WITH OR WITHOUT PRESENCE OF ULCERS  *URINARY PROBLEMS  *BOWEL PROBLEMS  UNUSUAL RASH Items with * indicate a potential emergency and should be followed up as soon as possible.  Feel free to call the clinic should you have any questions or concerns. The clinic phone number is [phone].  Please show the CHEMO ALERT CARD at check-in to the Emergency Department and triage nurse.   

## 2020-02-17 ENCOUNTER — Inpatient Hospital Stay: Payer: BC Managed Care – PPO | Attending: Hematology

## 2020-02-17 ENCOUNTER — Other Ambulatory Visit: Payer: Self-pay

## 2020-02-17 VITALS — BP 162/77 | HR 84 | Temp 99.4°F | Resp 18

## 2020-02-17 DIAGNOSIS — Z5112 Encounter for antineoplastic immunotherapy: Secondary | ICD-10-CM | POA: Diagnosis present

## 2020-02-17 DIAGNOSIS — C50911 Malignant neoplasm of unspecified site of right female breast: Secondary | ICD-10-CM

## 2020-02-17 DIAGNOSIS — C50311 Malignant neoplasm of lower-inner quadrant of right female breast: Secondary | ICD-10-CM | POA: Diagnosis present

## 2020-02-17 MED ORDER — ACETAMINOPHEN 325 MG PO TABS: 650.0000 mg | ORAL_TABLET | Freq: Once | ORAL | Status: DC

## 2020-02-17 MED ORDER — TRASTUZUMAB-HYALURONIDASE-OYSK 600-10000 MG-UNT/5ML ~~LOC~~ SOLN
600.0000 mg | Freq: Once | SUBCUTANEOUS | Status: AC
  Administered 2020-02-17: 600 mg via SUBCUTANEOUS
  Filled 2020-02-17: qty 5

## 2020-02-17 NOTE — Patient Instructions (Signed)
[facility] Discharge Instructions for Patients Receiving Chemotherapy  Today you received the following chemotherapy agents: Herceptin Hylecta.  To help prevent nausea and vomiting after your treatment, we encourage you to take your nausea medication as directed.   If you develop nausea and vomiting that is not controlled by your nausea medication, call the clinic.   BELOW ARE SYMPTOMS THAT SHOULD BE REPORTED IMMEDIATELY:  *FEVER GREATER THAN 100.5 F  *CHILLS WITH OR WITHOUT FEVER  NAUSEA AND VOMITING THAT IS NOT CONTROLLED WITH YOUR NAUSEA MEDICATION  *UNUSUAL SHORTNESS OF BREATH  *UNUSUAL BRUISING OR BLEEDING  TENDERNESS IN MOUTH AND THROAT WITH OR WITHOUT PRESENCE OF ULCERS  *URINARY PROBLEMS  *BOWEL PROBLEMS  UNUSUAL RASH Items with * indicate a potential emergency and should be followed up as soon as possible.  Feel free to call the clinic should you have any questions or concerns. The clinic phone number is [phone].  Please show the [place] at check-in to the Emergency Department and triage nurse.

## 2020-03-09 ENCOUNTER — Other Ambulatory Visit: Payer: Self-pay

## 2020-03-09 ENCOUNTER — Inpatient Hospital Stay: Payer: BC Managed Care – PPO

## 2020-03-09 VITALS — BP 160/74 | HR 76 | Temp 99.4°F | Resp 18

## 2020-03-09 DIAGNOSIS — C50911 Malignant neoplasm of unspecified site of right female breast: Secondary | ICD-10-CM

## 2020-03-09 DIAGNOSIS — Z5112 Encounter for antineoplastic immunotherapy: Secondary | ICD-10-CM | POA: Diagnosis not present

## 2020-03-09 MED ORDER — ACETAMINOPHEN 325 MG PO TABS: 650.0000 mg | ORAL_TABLET | Freq: Once | ORAL | Status: DC

## 2020-03-09 MED ORDER — TRASTUZUMAB-HYALURONIDASE-OYSK 600-10000 MG-UNT/5ML ~~LOC~~ SOLN
600.0000 mg | Freq: Once | SUBCUTANEOUS | Status: AC
  Administered 2020-03-09: 600 mg via SUBCUTANEOUS
  Filled 2020-03-09: qty 5

## 2020-03-09 NOTE — Progress Notes (Signed)
Given on left thigh, per pt request. Pt declined 15-minute post observation.

## 2020-03-09 NOTE — Patient Instructions (Signed)
[facility] Discharge Instructions for Patients Receiving Chemotherapy  Today you received the following chemotherapy agents Herceptin  To help prevent nausea and vomiting after your treatment, we encourage you to take your nausea medication as directed   If you develop nausea and vomiting that is not controlled by your nausea medication, call the clinic.   BELOW ARE SYMPTOMS THAT SHOULD BE REPORTED IMMEDIATELY:  *FEVER GREATER THAN 100.5 F  *CHILLS WITH OR WITHOUT FEVER  NAUSEA AND VOMITING THAT IS NOT CONTROLLED WITH YOUR NAUSEA MEDICATION  *UNUSUAL SHORTNESS OF BREATH  *UNUSUAL BRUISING OR BLEEDING  TENDERNESS IN MOUTH AND THROAT WITH OR WITHOUT PRESENCE OF ULCERS  *URINARY PROBLEMS  *BOWEL PROBLEMS  UNUSUAL RASH Items with * indicate a potential emergency and should be followed up as soon as possible.  Feel free to call the clinic should you have any questions or concerns. The clinic phone number is [phone].  Please show the CHEMO ALERT CARD at check-in to the Emergency Department and triage nurse.   

## 2020-03-30 ENCOUNTER — Other Ambulatory Visit: Payer: Self-pay

## 2020-03-30 ENCOUNTER — Inpatient Hospital Stay: Payer: BC Managed Care – PPO

## 2020-03-30 ENCOUNTER — Encounter: Payer: Self-pay | Admitting: Hematology

## 2020-03-30 ENCOUNTER — Inpatient Hospital Stay: Payer: BC Managed Care – PPO | Attending: Hematology | Admitting: Hematology

## 2020-03-30 VITALS — BP 140/80 | HR 74 | Temp 97.0°F | Resp 17 | Ht 70.0 in | Wt 227.6 lb

## 2020-03-30 DIAGNOSIS — C50911 Malignant neoplasm of unspecified site of right female breast: Secondary | ICD-10-CM

## 2020-03-30 DIAGNOSIS — Z5112 Encounter for antineoplastic immunotherapy: Secondary | ICD-10-CM | POA: Diagnosis not present

## 2020-03-30 DIAGNOSIS — E669 Obesity, unspecified: Secondary | ICD-10-CM | POA: Diagnosis not present

## 2020-03-30 DIAGNOSIS — Z95828 Presence of other vascular implants and grafts: Secondary | ICD-10-CM

## 2020-03-30 DIAGNOSIS — I10 Essential (primary) hypertension: Secondary | ICD-10-CM | POA: Insufficient documentation

## 2020-03-30 DIAGNOSIS — C50311 Malignant neoplasm of lower-inner quadrant of right female breast: Secondary | ICD-10-CM | POA: Diagnosis present

## 2020-03-30 DIAGNOSIS — C7800 Secondary malignant neoplasm of unspecified lung: Secondary | ICD-10-CM

## 2020-03-30 DIAGNOSIS — G620 Drug-induced polyneuropathy: Secondary | ICD-10-CM | POA: Diagnosis not present

## 2020-03-30 DIAGNOSIS — Z171 Estrogen receptor negative status [ER-]: Secondary | ICD-10-CM | POA: Diagnosis not present

## 2020-03-30 LAB — COMPREHENSIVE METABOLIC PANEL
ALT: 23 U/L (ref 0–44)
AST: 19 U/L (ref 15–41)
Albumin: 3.6 g/dL (ref 3.5–5.0)
Alkaline Phosphatase: 72 U/L (ref 38–126)
Anion gap: 7 (ref 5–15)
BUN: 15 mg/dL (ref 6–20)
CO2: 28 mmol/L (ref 22–32)
Calcium: 9.8 mg/dL (ref 8.9–10.3)
Chloride: 105 mmol/L (ref 98–111)
Creatinine, Ser: 0.96 mg/dL (ref 0.44–1.00)
GFR calc Af Amer: >60 mL/min (ref >60)
GFR calc non Af Amer: >60 mL/min (ref >60)
Glucose, Bld: 110 mg/dL — ABNORMAL HIGH (ref 70–99)
Potassium: 3.8 mmol/L (ref 3.5–5.1)
Sodium: 140 mmol/L (ref 135–145)
Total Bilirubin: 0.4 mg/dL (ref 0.3–1.2)
Total Protein: 7.8 g/dL (ref 6.5–8.1)

## 2020-03-30 LAB — CBC WITH DIFFERENTIAL/PLATELET
Abs Immature Granulocytes: 0.03 10*3/uL (ref 0.00–0.07)
Basophils Absolute: 0 10*3/uL (ref 0.0–0.1)
Basophils Relative: 0 %
Eosinophils Absolute: 0.1 10*3/uL (ref 0.0–0.5)
Eosinophils Relative: 1 %
HCT: 36.3 % (ref 36.0–46.0)
Hemoglobin: 12 g/dL (ref 12.0–15.0)
Immature Granulocytes: 0 %
Lymphocytes Relative: 27 %
Lymphs Abs: 2 10*3/uL (ref 0.7–4.0)
MCH: 29.3 pg (ref 26.0–34.0)
MCHC: 33.1 g/dL (ref 30.0–36.0)
MCV: 88.5 fL (ref 80.0–100.0)
Monocytes Absolute: 0.4 10*3/uL (ref 0.1–1.0)
Monocytes Relative: 6 %
Neutro Abs: 4.8 10*3/uL (ref 1.7–7.7)
Neutrophils Relative %: 66 %
Platelets: 215 10*3/uL (ref 150–400)
RBC: 4.1 MIL/uL (ref 3.87–5.11)
RDW: 13 % (ref 11.5–15.5)
WBC: 7.3 10*3/uL (ref 4.0–10.5)
nRBC: 0 % (ref 0.0–0.2)

## 2020-03-30 MED ORDER — ACETAMINOPHEN 325 MG PO TABS
ORAL_TABLET | ORAL | Status: AC
  Filled 2020-03-30: qty 2

## 2020-03-30 MED ORDER — TRASTUZUMAB-HYALURONIDASE-OYSK 600-10000 MG-UNT/5ML ~~LOC~~ SOLN
600.0000 mg | Freq: Once | SUBCUTANEOUS | Status: AC
  Administered 2020-03-30: 600 mg via SUBCUTANEOUS
  Filled 2020-03-30: qty 5

## 2020-03-30 MED ORDER — SODIUM CHLORIDE 0.9% FLUSH
10.0000 mL | Freq: Once | INTRAVENOUS | Status: AC
  Administered 2020-03-30: 10 mL
  Filled 2020-03-30: qty 10

## 2020-03-30 MED ORDER — GABAPENTIN 100 MG PO CAPS: 100.0000 mg | ORAL_CAPSULE | Freq: Every day | ORAL | 3 refills | Status: DC

## 2020-03-30 MED ORDER — ACETAMINOPHEN 325 MG PO TABS: 650.0000 mg | ORAL_TABLET | Freq: Once | ORAL | Status: DC

## 2020-03-30 MED ORDER — HEPARIN SOD (PORK) LOCK FLUSH 100 UNIT/ML IV SOLN
500.0000 [IU] | Freq: Once | INTRAVENOUS | Status: AC | PRN
  Administered 2020-03-30: 500 [IU] via INTRAVENOUS
  Filled 2020-03-30: qty 5

## 2020-03-30 NOTE — Progress Notes (Signed)
[facility]   [phone] [phone]   Clinic Follow up Note   Patient Care Team: Virginia Rochester, [place] as PCP - General (Family Medicine) [name], DO (Family Medicine) Holley Bouche, NP (Inactive) as Nurse Practitioner (Nurse Practitioner) Stark Klein, MD as Consulting Physician (General Surgery) Arloa Koh, MD (Inactive) as Consulting Physician (Radiation Oncology) [name], MD as Consulting Physician (Hematology) Clydie Braun, MD (Obstetrics and Gynecology) Haroldine Laws, Shaune Pascal, MD as Consulting Physician (Cardiology)  Date of Service:  03/30/2020  CHIEF COMPLAINT: F/u right breast cancer  SUMMARY OF ONCOLOGIC HISTORY: Oncology History Overview Note  Breast cancer 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   Staging form: Breast, AJCC 7th Edition     Clinical stage from 07/22/2014: Stage IV (T3, N1, M1) - Unsigned     Breast cancer 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 ([place])  07/02/2014 Mammogram   Mammogram showed a 2cm right beast mass and a 1.8cm right axillary node. MRI breast on 07/16/2014 showed 7cm R breast lesion and 4.4cm r axillary node    07/02/2014 Imaging   CT CAP: a 4.7cm mass in LUL lung and a 2.1cm mas in RML, and a small nodule in RUL, suspecious for metastasis     07/09/2014 Initial Diagnosis   right IDA with b/l lung lesions, ER-/PR-/HER2+   07/09/2014 Initial Biopsy   US guided right breast mass and axillary node biopsy showed IDA, and DCIS, ER-/PR-/HER2+   07/26/2014 Pathologic Stage   Left lung mass by IR, path revealed high grade carcinoma, morphology similar to breast tumor biopsy, TTF(-), NapsinA(-), ER(-)   08/04/2014 - 03/22/2015 Chemotherapy   weekly Paclitaxel 58m/m2, trastuzumab and pertuzumab every 3 weeks   08/30/2014 Genetic Testing   BreastNext panel was negative. 17 genes including BRCA1, BRCA2, were negative for mutations.    10/04/2014 Imaging   Interval decrease in the right axillary lymphadenopathy. Bilateral  pulmonary lesions with left hilar lymphadenopathy also markedly decreased in the interval. The left hilar lymphadenopathy has resolved.   12/20/2014 Imaging   restaging CT showed stable disease, no new lesions    03/29/2015 Pathology Results    right breast lumpectomy showed  chemotherapy treatment effect,  a 1 mm residual tumor,   margins were widely negative, 5 sentinel lymph nodes and 2 axillary lymph nodes were negative.    03/29/2015 Surgery    right breast lumpectomy and sentinel lymph node biopsy  by Dr. BBarry Dienes  04/12/2015 -  Chemotherapy   Herceptin maintenance therapy , 6 mg/kg, every 3 weeks since 04/12/15. Switched to Herceptin Hylecta injection q3weeks after 06/10/19.   05/03/2015 - 06/14/2015 Radiation Therapy   right breast adjuvant irradiation by Dr. MValere Dross   08/28/2016 Imaging   CT CAP w Contrast  IMPRESSION: 1. No acute process or evidence of metastatic disease within the chest, abdomen, or pelvis. 2. Similar to less well-defined left upper lobe density, likely scarring. No evidence of new or progressive pulmonary metastasis. 3. Right nephrolithiasis.   01/22/2017 Imaging   CT Chest W Contrast 01/22/17 IMPRESSION: 1. Stable exam. No new or progressive findings. No evidence for metastatic disease. 2. Left upper lobe architectural distortion/scarring is stable. 3. Nonobstructing right renal stone.   07/31/2017 Imaging   Bone Scan Whole Body 07/31/17  IMPRESSION: 1. No scintigraphic evidence of osseous metastatic disease. 2. Thoracolumbar scoliosis.    07/31/2017 Imaging   CT CAP W Contrast 07/31/17 IMPRESSION: 1. No findings of active or recurrent malignancy. 2. Other imaging findings of potential clinical significance: Aortic  Atherosclerosis (ICD10-I70.0). Mild cardiomegaly. Postoperative and radiation therapy findings in the right chest. Nonobstructive right nephrolithiasis. Thoracolumbar scoliosis.    01/20/2018 Echocardiogram   ECHO 01/20/2018 Study  Conclusions  - Left ventricle: The cavity size was normal. Wall thickness was   normal. Systolic function was normal. The estimated ejection   fraction was in the range of 55% to 60%. Wall motion was normal;   there were no regional wall motion abnormalities. Doppler   parameters are consistent with abnormal left ventricular   relaxation (grade 1 diastolic dysfunction). - Impressions: GLS -20.7% LS&' 10.4 cm.    02/17/2018 Imaging   02/17/2018 CT CAP IMPRESSION: 1. No evidence for residual or recurrent tumor or metastatic disease. 2.  Aortic Atherosclerosis (ICD10-I70.0). 3. Right renal calculi. 4. Scoliosis.   08/11/2018 Imaging   CT CAP W Contrast 08/11/18  IMPRESSION: No evidence for localized recurrence or metastatic disease in the chest, abdomen or pelvis.   02/23/2019 Imaging   CT CAP W Contrast  IMPRESSION: 1. No findings of active malignancy in the chest, abdomen, or pelvis. 2. 3 mm right kidney lower pole nonobstructive renal calculus. 3. Thoracolumbar scoliosis.   12/08/2019 Imaging   CT CAP W contrast  IMPRESSION: 1. Unchanged post treatment appearance of the lungs. No evidence of recurrent or new metastatic disease in the chest, abdomen, or pelvis.   2. Postoperative findings of right lumpectomy and axillary lymph node dissection.   3.  Nonobstructive right nephrolithiasis.      CURRENT THERAPY:  MaintenanceHerceptin every 3 weeks since 04/12/15. Switched to Herceptin Hylecta injection q3weeks after 06/10/19.  INTERVAL HISTORY:  [name] [name] is here for a follow up and ongoing treatment. She presents to the clinic alone. She notes her intermittent neuropathy persists and now more frequent. She has b/l LE neuropathy but more of her left LE. She notes this does not wake her up or effect her sleep. She wonders if she should proceed with COVID vaccine booster.     REVIEW OF SYSTEMS:   Constitutional: Denies fevers, chills or abnormal weight loss Eyes:  Denies blurriness of vision Ears, nose, mouth, throat, and face: Denies mucositis or sore throat Respiratory: Denies cough, dyspnea or wheezes Cardiovascular: Denies palpitation, chest discomfort or lower extremity swelling Gastrointestinal:  Denies nausea, heartburn or change in bowel habits Skin: Denies abnormal skin rashes Lymphatics: Denies new lymphadenopathy or easy bruising Neurological: (+)Intermittent neuropathy with tingling in hands and LE but mainly in left Lower leg  Behavioral/Psych: Mood is stable, no new changes  All other systems were reviewed with the patient and are negative.  MEDICAL HISTORY:  Past Medical History:  Diagnosis Date  . Breast cancer ([place])   . Breast cancer ([place])   . GERD (gastroesophageal reflux disease)    during pregnancy   . Pneumonia    hx of pneumonia 08/2013   . S/P radiation therapy 05/03/2015 through 06/14/2015    Right breast 4680 cGy in 26 sessions, right breast boost 1000 cGy in 5 sessions. Right supraclavicular/axillary region 4680 cGy with a supplemental PA field to bring the axillary dose up to 4500 cGy in 26 sessions    SURGICAL HISTORY: Past Surgical History:  Procedure Laterality Date  . BREAST LUMPECTOMY WITH RADIOACTIVE SEED AND SENTINEL LYMPH NODE BIOPSY Right 03/29/2015   Procedure: BREAST LUMPECTOMY WITH RADIOACTIVE SEED AND SENTINEL LYMPH NODE BIOPSY;  Surgeon: Stark Klein, MD;  Location: [facility];  Service: General;  Laterality: Right;  . CESAREAN SECTION    .  CHOLECYSTECTOMY    . ESSURE TUBAL LIGATION    . PORTACATH PLACEMENT Left 07/21/2014   Procedure: INSERTION PORT-A-CATH;  Surgeon: Stark Klein, MD;  Location: [facility];  Service: General;  Laterality: Left;  . WISDOM TOOTH EXTRACTION      I have reviewed the social history and family history with the patient and they are unchanged from previous note.  ALLERGIES:  is allergic to adhesive [tape],  aspirin, and codeine.  MEDICATIONS:  Current Outpatient Medications  Medication Sig Dispense Refill  . acetaminophen (TYLENOL) 500 MG tablet Take 500 mg by mouth every 6 (six) hours as needed.    . Ascorbic Acid (VITAMIN C) 500 MG CHEW     . Calcium-Phosphorus-Vitamin D (CALCIUM GUMMIES PO) Take by mouth daily.    . Camphor-Eucalyptus-Menthol (VICKS VAPORUB EX) Apply 1 application topically at bedtime. Applies under nose, throat and on chest.    . cetirizine (ZYRTEC) 10 MG tablet Take 10 mg by mouth daily as needed for allergies (allergies).     . fluticasone (FLONASE) 50 MCG/ACT nasal spray Place into both nostrils daily.    . hydrocortisone 2.5 % cream Apply 1 application topically 2 (two) times daily as needed.    Marland Kitchen ibuprofen (ADVIL,MOTRIN) 800 MG tablet Take 1 tablet (800 mg total) by mouth every 8 (eight) hours as needed. 60 tablet 1  . lidocaine-prilocaine (EMLA) cream APPLY TOPICALLY TO THE AFFECTED AREA AS NEEDED 30 g 2  . loperamide (IMODIUM A-D) 2 MG tablet Take 1 tablet (2 mg total) by mouth 4 (four) times daily as needed for diarrhea or loose stools. 30 tablet 0  . misoprostol (CYTOTEC) 200 MCG tablet Take 400 mcg by mouth 2 (two) times daily.    . Multiple Vitamins-Minerals (MULTIVITAMIN GUMMIES ADULT PO) Take 1 each by mouth daily. Women's Vitafusion gummie    . spironolactone (ALDACTONE) 25 MG tablet Take 1 tablet (25 mg total) by mouth daily. 90 tablet 3  . triamcinolone (KENALOG) 0.025 % ointment Apply 1 application topically as needed.     No current facility-administered medications for this visit.   Facility-Administered Medications Ordered in Other Visits  Medication Dose Route Frequency Provider Last Rate Last Admin  . acetaminophen (TYLENOL) tablet 650 mg  650 mg Oral Once [name], MD      . acetaminophen (TYLENOL) tablet 650 mg  650 mg Oral Once [name], MD      . heparin lock flush 100 unit/mL  500 Units Intracatheter Once PRN [name], MD      . sodium  chloride 0.9 % injection 10 mL  10 mL Intracatheter PRN [name], MD   10 mL at 05/01/16 1622  . sodium chloride 0.9 % injection 10 mL  10 mL Intravenous PRN [name], MD      . sodium chloride 0.9 % injection 10 mL  10 mL Intracatheter PRN [name], MD   10 mL at 03/18/19 1715  . sodium chloride flush (NS) 0.9 % injection 10 mL  10 mL Intravenous PRN [name], MD        PHYSICAL EXAMINATION: ECOG PERFORMANCE STATUS: 0 - Asymptomatic  Vitals:   03/30/20 1437  BP: 140/80  Pulse: 74  Resp: 17  Temp: (!) 97 F (36.1 C)  SpO2: 100%   Filed Weights   03/30/20 1437  Weight: 227 lb 9.6 oz (103.2 kg)    Due to COVID19 we will limit examination to appearance. Patient had no complaints.  GENERAL:alert, no distress and  comfortable SKIN: skin color normal, no rashes or significant lesions EYES: normal, Conjunctiva are pink and non-injected, sclera clear  NEURO: alert & oriented x 3 with fluent speech   LABORATORY DATA:  I have reviewed the data as listed CBC Latest Ref Rng & Units 03/30/2020 01/27/2020 11/26/2019  WBC 4.0 - 10.5 K/uL 7.3 7.7 3.7(L)  Hemoglobin 12.0 - 15.0 g/dL 12.0 11.8(L) 12.3  Hematocrit 36 - 46 % 36.3 36.2 38.0  Platelets 150 - 400 K/uL 215 229 194     CMP Latest Ref Rng & Units 03/30/2020 01/27/2020 11/26/2019  Glucose 70 - 99 mg/dL 110(H) 120(H) 122(H)  BUN 6 - 20 mg/dL 15 16 11   Creatinine 0.44 - 1.00 mg/dL 0.96 0.99 1.03(H)  Sodium 135 - 145 mmol/L 140 139 141  Potassium 3.5 - 5.1 mmol/L 3.8 3.8 3.6  Chloride 98 - 111 mmol/L 105 104 104  CO2 22 - 32 mmol/L 28 26 27   Calcium 8.9 - 10.3 mg/dL 9.8 8.8(L) 9.1  Total Protein 6.5 - 8.1 g/dL 7.8 7.6 7.9  Total Bilirubin 0.3 - 1.2 mg/dL 0.4 0.3 0.7  Alkaline Phos 38 - 126 U/L 72 71 65  AST 15 - 41 U/L 19 12(L) 22  ALT 0 - 44 U/L 23 12 19       RADIOGRAPHIC STUDIES: I have personally reviewed the radiological images as listed and agreed with the findings in the report. No results found.   ASSESSMENT &  PLAN:  [name] [name] is a 49 y.o. 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 Switched to Herceptin Hylecta injection q3weeks after 06/10/19. Tolerating well.  -She underwent hysteroscopy with D&C on 11/27/18 to remove benign polyps that were recently found. -Her CT CAP from 12/08/19 showed stable post treatment appearance of the lungs. No evidence of recurrence or new metastatic disease.  -She has been on Herceptin for almost 5 years. I discussed after 5 years we will repeat scan and ECHOs every 6 months. I discussed her case is unusual given her prolonged disease free status although she is still at risk for recurrence. I discussed that there is no date to support stopping Herceptin given the initial metastatic disease.  However I may consider stopping her Herceptin if she remains NED after 10 years. She is agreeable.  -She is clinically doing well and stable. Labs reviewed, CBC and CMP WNL. She will proceed with Herceptin today and continue every 3 weeks.  -Since she is doing very well, will space out her labs to every 12 weeks, continue port flush every 6 weeks, follow-up to every 4 months, and scan every 12 months.   2. HTN -On losartangivingby Dr. BHaroldine Lawsin June 2019, dose increasedin 08/2018. -07/22/18 ECHO showed EF at 60-65%, GLS 22%  3. Intermittent Neuropathy  -Residual effects from her prior chemo treatment -She notes her intermittent neuropathy is happening more frequently, mostly at night or relaxed with tingling in hands and LE but mainly in left Lower leg.  -Due to tingling I recommend she take Gabapentin 105mat nightly first. Will titrate based on need. She can also take B complex if her Multivitamin does not have enough B12 or B6.   4. Obesity  -I encourage pt  to watch her diet, exercise and try to loose some weight -She did lose weight, but gained some of this back.  I encouraged her to remain active and eat healthy diet.  5. Vaginal spotting and sever cramping -She has had irregular periods since her chemo for breast cancer ended in 2016.  -She did have uterine polyps removed in early 2020. After 4 months of no periods, she had 1 episode of vaginal spotting which started and ended with severe cramping. This has not recurred since.  -I advised she sees her Gyn for exam and possible Pelvic US evaluation. She is agreeable.   6. Cancer Screenings  -I encouraged her to continue yearly mammograms and regular Pap Smears -She can start screening colonoscopy this year.  -I also encouraged her to f/u with PCP.   7. Hypocalcemia  -Her 01/27/20 shows Ca at 8.8. -I recommend she start OTC calcium daily.    Plan -I called in Gabapentin 143m nightly.  -Proceed with Herceptin today  -Herceptin Hylecta injection every 3 weeks X6 -Port Flush every 6 weeks  -Lab every 12 weeks -f/u in 18 weeks.    No problem-specific Assessment & Plan notes found for this encounter.   No orders of the defined types were placed in this encounter.  All questions were answered. The patient knows to call the clinic with any problems, questions or concerns. No barriers to learning was detected. The total time spent in the appointment was 30 minutes.     YTruitt Merle MD 03/30/2020   I, AJoslyn Devon am acting as scribe for YTruitt Merle MD.   I have reviewed the above documentation for accuracy and completeness, and I agree with the above.

## 2020-03-30 NOTE — Patient Instructions (Signed)
[facility] Discharge Instructions for Patients Receiving Chemotherapy  Today you received the following chemotherapy agents: Herceptin Hylecta.  To help prevent nausea and vomiting after your treatment, we encourage you to take your nausea medication as directed.   If you develop nausea and vomiting that is not controlled by your nausea medication, call the clinic.   BELOW ARE SYMPTOMS THAT SHOULD BE REPORTED IMMEDIATELY:  *FEVER GREATER THAN 100.5 F  *CHILLS WITH OR WITHOUT FEVER  NAUSEA AND VOMITING THAT IS NOT CONTROLLED WITH YOUR NAUSEA MEDICATION  *UNUSUAL SHORTNESS OF BREATH  *UNUSUAL BRUISING OR BLEEDING  TENDERNESS IN MOUTH AND THROAT WITH OR WITHOUT PRESENCE OF ULCERS  *URINARY PROBLEMS  *BOWEL PROBLEMS  UNUSUAL RASH Items with * indicate a potential emergency and should be followed up as soon as possible.  Feel free to call the clinic should you have any questions or concerns. The clinic phone number is [phone].  Please show the [facility] at check-in to the Emergency Department and triage nurse.

## 2020-03-30 NOTE — Patient Instructions (Signed)

## 2020-03-31 ENCOUNTER — Telehealth: Payer: Self-pay | Admitting: Hematology

## 2020-03-31 NOTE — Telephone Encounter (Signed)
Scheduled per 8/19 los. Pt is aware of appt times and date. Unable to schedule f/u on 12/22, MD is on PAL that week.

## 2020-04-10 NOTE — Progress Notes (Signed)
Cardio-Oncology Clinic Note   Date:  04/11/2020   ID:  [name] [name], DOB [birth year], MRN [id number]  Location: Home  Provider location: [facility] Type of Visit: Established patient  PCP:  [name], PA  Cardiologist:  No primary care provider on file. Primary HF: [name] [name]  Chief Complaint: Cardio-oncology follow-up   History of Present Illness:  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 Mosetta Putt for enrollment into the cardio-oncology clinic for surveillance while receiving Herceptin (started 04/12/15 and on indefinitiely) who presents via audio/video conferencing for a telehealth visit today.     Continues to receive herceptin for metastatic breast CA (has been receiving Herceptin since 8/16. Last CT scan on 12/08/19 showed lungs remain clear). Works for [employer] for [employer] with [employer]. Doing well. No SOB, orthopnea or PND. No edema. BP continues to run a bit high.   Echo today 04/11/20 EF 60-65% GLS -22.5% Personally reviewed   Studies:  Echo 09/21/19 EF 60-65% GLS -21.9% Echo 03/02/19  EF 55-60% GLS -18.2%  Echo 12/19 60-65% GLS -22.2%  Echo 01/20/18. EF 55-60% LS' 10.4 cm/s GLS -20.7%  Echo 06/20/17 EF 55-60% LS' not measured will  GLS -19.1   Past Medical History:  Diagnosis Date  . Breast cancer (HCC)   . Breast cancer (HCC)   . GERD (gastroesophageal reflux disease)    during pregnancy   . Pneumonia    hx of pneumonia 08/2013   . S/P radiation therapy 05/03/2015 through 06/14/2015    Right breast 4680 cGy in 26 sessions, right breast boost 1000 cGy in 5 sessions. Right supraclavicular/axillary region 4680 cGy with a supplemental PA field to bring the axillary dose up to 4500 cGy in 26 sessions   Past Surgical History:  Procedure Laterality Date  . BREAST LUMPECTOMY WITH RADIOACTIVE SEED AND SENTINEL LYMPH NODE BIOPSY Right 03/29/2015    Procedure: BREAST LUMPECTOMY WITH RADIOACTIVE SEED AND SENTINEL LYMPH NODE BIOPSY;  Surgeon: Almond Lint, MD;  Location: [facility];  Service: General;  Laterality: Right;  . CESAREAN SECTION    . CHOLECYSTECTOMY    . ESSURE TUBAL LIGATION    . PORTACATH PLACEMENT Left 07/21/2014   Procedure: INSERTION PORT-A-CATH;  Surgeon: Almond Lint, MD;  Location: [facility];  Service: General;  Laterality: Left;  . WISDOM TOOTH EXTRACTION       Current Outpatient Medications  Medication Sig Dispense Refill  . acetaminophen (TYLENOL) 500 MG tablet Take 500 mg by mouth every 6 (six) hours as needed.    . Ascorbic Acid (VITAMIN C) 500 MG CHEW     . Calcium-Phosphorus-Vitamin D (CALCIUM GUMMIES PO) Take by mouth daily.    . Camphor-Eucalyptus-Menthol (VICKS VAPORUB EX) Apply 1 application topically at bedtime. Applies under nose, throat and on chest.    . cetirizine (ZYRTEC) 10 MG tablet Take 10 mg by mouth daily as needed for allergies (allergies).     . clobetasol (TEMOVATE) 0.05 % external solution Apply 1 application topically 2 (two) times daily.    . fluticasone (FLONASE) 50 MCG/ACT nasal spray Place into both nostrils daily.    Marland Kitchen gabapentin (NEURONTIN) 100 MG capsule Take 1 capsule (100 mg total) by mouth at bedtime. 30 capsule 3  . hydrocortisone 2.5 % cream Apply 1 application topically 2 (two) times daily as needed.    Marland Kitchen ibuprofen (ADVIL,MOTRIN) 800 MG tablet Take 1 tablet (800 mg total)  by mouth every 8 (eight) hours as needed. 60 tablet 1  . lidocaine-prilocaine (EMLA) cream APPLY TOPICALLY TO THE AFFECTED AREA AS NEEDED 30 g 2  . loperamide (IMODIUM A-D) 2 MG tablet Take 1 tablet (2 mg total) by mouth 4 (four) times daily as needed for diarrhea or loose stools. 30 tablet 0  . Multiple Vitamins-Minerals (MULTIVITAMIN GUMMIES ADULT PO) Take 1 each by mouth daily. Women's Vitafusion gummie    . spironolactone (ALDACTONE) 25 MG tablet Take 1 tablet (25 mg total) by mouth daily. 90  tablet 3  . triamcinolone (KENALOG) 0.025 % ointment Apply 1 application topically as needed.    . misoprostol (CYTOTEC) 200 MCG tablet Take 400 mcg by mouth 2 (two) times daily.     No current facility-administered medications for this encounter.   Facility-Administered Medications Ordered in Other Encounters  Medication Dose Route Frequency Provider Last Rate Last Admin  . acetaminophen (TYLENOL) tablet 650 mg  650 mg Oral Once Malachy Mood, MD      . heparin lock flush 100 unit/mL  500 Units Intracatheter Once PRN Malachy Mood, MD      . sodium chloride 0.9 % injection 10 mL  10 mL Intracatheter PRN Malachy Mood, MD   10 mL at 05/01/16 1622  . sodium chloride 0.9 % injection 10 mL  10 mL Intravenous PRN Malachy Mood, MD      . sodium chloride 0.9 % injection 10 mL  10 mL Intracatheter PRN Malachy Mood, MD   10 mL at 03/18/19 1715  . sodium chloride flush (NS) 0.9 % injection 10 mL  10 mL Intravenous PRN Malachy Mood, MD        Allergies:   Adhesive [tape], Aspirin, and Codeine   Social History:  The patient  reports that she has never smoked. She has never used smokeless tobacco. She reports current alcohol use. She reports that she does not use drugs.   Family History:  The patient's family history includes Breast cancer (age of onset: 13) in her maternal aunt; Breast cancer (age of onset: 43) in her mother; Breast cancer (age of onset: 58) in her cousin; Breast cancer (age of onset: 53) in her maternal aunt; Liver cancer in her father and paternal grandmother; Prostate cancer in her paternal grandfather; Prostate cancer (age of onset: 57) in her maternal uncle; Prostate cancer (age of onset: 25) in her maternal uncle; Prostate cancer (age of onset: 24) in her maternal grandfather.   ROS:  Please see the history of present illness.   All other systems are personally reviewed and negative.    Vitals:   04/11/20 1118  BP: (!) 142/82  Pulse: 70  SpO2: 98%  Weight: 103.6 kg (228 lb 6 oz)    Exam:   General:  Well appearing. No resp difficulty HEENT: normal Neck: supple. no JVD. Carotids 2+ bilat; no bruits. No lymphadenopathy or thryomegaly appreciated. Cor: PMI nondisplaced. Regular rate & rhythm. No rubs, gallops or murmurs. Lungs: clear Abdomen: soft, nontender, nondistended. No hepatosplenomegaly. No bruits or masses. Good bowel sounds. Extremities: no cyanosis, clubbing, rash, edema Neuro: alert & orientedx3, cranial nerves grossly intact. moves all 4 extremities w/o difficulty. Affect pleasant  Recent Labs: 03/30/2020: ALT 23; BUN 15; Creatinine, Ser 0.96; Hemoglobin 12.0; Platelets 215; Potassium 3.8; Sodium 140  Personally reviewed   Wt Readings from Last 3 Encounters:  04/11/20 103.6 kg (228 lb 6 oz)  03/30/20 103.2 kg (227 lb 9.6 oz)  01/27/20 101.7 kg (224 lb  4 oz)      ASSESSMENT AND PLAN:  1. R breast invasive ductal carcinoma, T3N1M1, stage IV, ER-/PR-/HER2+, with metastases to b/l lungs, biopsy confirmed - I reviewed echos personally. EF and Doppler parameters stable. No HF on exam. Continue Herceptin.  - repeat echos in 6 months - recent scans ok  2. HTN - Blood pressure remains elevated - Continue spiro 25 daily  - Restart losartan 25 daily   Arvilla Meres, MD  11:30 AM  [facility] [address] [facility] [place] [address] [phone] (office) [phone] (fax)

## 2020-04-11 ENCOUNTER — Ambulatory Visit (HOSPITAL_COMMUNITY)
Admission: RE | Admit: 2020-04-11 | Discharge: 2020-04-11 | Disposition: A | Discharge status: Home / Self Care | Payer: BC Managed Care – PPO | Source: Ambulatory Visit | Attending: Internal Medicine | Admitting: Internal Medicine

## 2020-04-11 ENCOUNTER — Ambulatory Visit (HOSPITAL_BASED_OUTPATIENT_CLINIC_OR_DEPARTMENT_OTHER)
Admission: RE | Admit: 2020-04-11 | Discharge: 2020-04-11 | Disposition: A | Discharge status: Home / Self Care | Payer: BC Managed Care – PPO | Source: Ambulatory Visit | Attending: Internal Medicine | Admitting: Internal Medicine

## 2020-04-11 ENCOUNTER — Other Ambulatory Visit: Payer: Self-pay

## 2020-04-11 VITALS — BP 142/82 | HR 70 | Wt 228.4 lb

## 2020-04-11 DIAGNOSIS — Z95828 Presence of other vascular implants and grafts: Secondary | ICD-10-CM | POA: Diagnosis not present

## 2020-04-11 DIAGNOSIS — Z886 Allergy status to analgesic agent status: Secondary | ICD-10-CM | POA: Insufficient documentation

## 2020-04-11 DIAGNOSIS — I509 Heart failure, unspecified: Secondary | ICD-10-CM | POA: Diagnosis not present

## 2020-04-11 DIAGNOSIS — C7800 Secondary malignant neoplasm of unspecified lung: Secondary | ICD-10-CM

## 2020-04-11 DIAGNOSIS — Z5181 Encounter for therapeutic drug level monitoring: Secondary | ICD-10-CM | POA: Diagnosis present

## 2020-04-11 DIAGNOSIS — I10 Essential (primary) hypertension: Secondary | ICD-10-CM

## 2020-04-11 DIAGNOSIS — Z803 Family history of malignant neoplasm of breast: Secondary | ICD-10-CM | POA: Insufficient documentation

## 2020-04-11 DIAGNOSIS — C50911 Malignant neoplasm of unspecified site of right female breast: Secondary | ICD-10-CM | POA: Diagnosis not present

## 2020-04-11 DIAGNOSIS — Z8042 Family history of malignant neoplasm of prostate: Secondary | ICD-10-CM | POA: Diagnosis not present

## 2020-04-11 DIAGNOSIS — Z79899 Other long term (current) drug therapy: Secondary | ICD-10-CM | POA: Insufficient documentation

## 2020-04-11 DIAGNOSIS — Z888 Allergy status to other drugs, medicaments and biological substances status: Secondary | ICD-10-CM | POA: Insufficient documentation

## 2020-04-11 DIAGNOSIS — I110 Hypertensive heart disease with heart failure: Secondary | ICD-10-CM | POA: Diagnosis not present

## 2020-04-11 DIAGNOSIS — C50919 Malignant neoplasm of unspecified site of unspecified female breast: Secondary | ICD-10-CM

## 2020-04-11 DIAGNOSIS — Z9049 Acquired absence of other specified parts of digestive tract: Secondary | ICD-10-CM | POA: Insufficient documentation

## 2020-04-11 DIAGNOSIS — Z885 Allergy status to narcotic agent status: Secondary | ICD-10-CM | POA: Insufficient documentation

## 2020-04-11 DIAGNOSIS — Z171 Estrogen receptor negative status [ER-]: Secondary | ICD-10-CM | POA: Diagnosis not present

## 2020-04-11 LAB — ECHOCARDIOGRAM COMPLETE
Area-P 1/2: 2.42 cm2
S' Lateral: 2.9 cm

## 2020-04-11 MED ORDER — SPIRONOLACTONE 25 MG PO TABS: 25.0000 mg | ORAL_TABLET | Freq: Every day | ORAL | 3 refills | Status: DC

## 2020-04-11 MED ORDER — LOSARTAN POTASSIUM 25 MG PO TABS: 25.0000 mg | ORAL_TABLET | Freq: Every day | ORAL | 6 refills | Status: DC

## 2020-04-11 NOTE — Progress Notes (Signed)
Echocardiogram 2D Echocardiogram has been performed.  Oneal Deputy Ervin Rothbauer 04/11/2020, 10:55 AM

## 2020-04-11 NOTE — Patient Instructions (Signed)
START Losartan 25mg  Daily  Please call our office in February to schedule your follow up appointment with an echocardiogram.  Your physician has requested that you have an echocardiogram. Echocardiography is a painless test that uses sound waves to create images of your heart. It provides your doctor with information about the size and shape of your heart and how well your heart's chambers and valves are working. This procedure takes approximately one hour. There are no restrictions for this procedure.   If you have any questions or concerns before your next appointment please send [place] a message through [place] or call our office at [phone].    TO LEAVE A MESSAGE FOR THE NURSE SELECT OPTION 2, PLEASE LEAVE A MESSAGE INCLUDING: . YOUR NAME . DATE OF BIRTH . CALL BACK NUMBER . REASON FOR CALL**this is important as we prioritize the call backs  [place] AS LONG AS YOU CALL BEFORE 4:00 PM  At the [facility], you and your health needs are our priority. As part of our continuing mission to provide you with exceptional heart care, we have created designated Provider Care Teams. These Care Teams include your primary Cardiologist (physician) and Advanced Practice Providers (APPs- Physician Assistants and Nurse Practitioners) who all work together to provide you with the care you need, when you need it.   You may see any of the following providers on your designated Care Team at your next follow up: Marland Kitchen Dr Glori Bickers . Dr Loralie Champagne . Darrick Grinder, NP . Lyda Jester, PA . Audry Riles, PharmD   Please be sure to bring in all your medications bottles to every appointment.

## 2020-04-11 NOTE — Addendum Note (Signed)
Encounter addended by: Malena Edman, RN on: 04/11/2020 11:41 AM  Actions taken: Visit diagnoses modified, Order list changed, Diagnosis association updated, Clinical Note Signed

## 2020-04-20 ENCOUNTER — Inpatient Hospital Stay: Payer: BC Managed Care – PPO | Attending: Hematology

## 2020-04-20 ENCOUNTER — Other Ambulatory Visit: Payer: Self-pay

## 2020-04-20 VITALS — BP 137/76 | HR 73 | Temp 98.5°F | Resp 16 | Ht 70.0 in | Wt 225.5 lb

## 2020-04-20 DIAGNOSIS — Z5112 Encounter for antineoplastic immunotherapy: Secondary | ICD-10-CM | POA: Insufficient documentation

## 2020-04-20 DIAGNOSIS — C50311 Malignant neoplasm of lower-inner quadrant of right female breast: Secondary | ICD-10-CM | POA: Diagnosis present

## 2020-04-20 DIAGNOSIS — C50911 Malignant neoplasm of unspecified site of right female breast: Secondary | ICD-10-CM

## 2020-04-20 MED ORDER — ACETAMINOPHEN 325 MG PO TABS: 650.0000 mg | ORAL_TABLET | Freq: Once | ORAL | Status: DC

## 2020-04-20 MED ORDER — TRASTUZUMAB-HYALURONIDASE-OYSK 600-10000 MG-UNT/5ML ~~LOC~~ SOLN
600.0000 mg | Freq: Once | SUBCUTANEOUS | Status: AC
  Administered 2020-04-20: 600 mg via SUBCUTANEOUS
  Filled 2020-04-20: qty 5

## 2020-05-09 ENCOUNTER — Telehealth: Payer: Self-pay

## 2020-05-09 NOTE — Telephone Encounter (Signed)
[name] [name] left vm stating she has been exposed to covid 19.  She will be tested.  She wanted to know if she needed to reschedule her appts for this week.  I returned her call and left vm. I spoke with [name] [name].  She was exposed on Friday.  She will get tested tomorrow.  She will call with results.  I explained if she is positive for covid she will not be able to come to [facility] for 21 days.  She will also be screened for covid treatment.  She verbalized understanding

## 2020-05-11 ENCOUNTER — Inpatient Hospital Stay: Payer: BC Managed Care – PPO

## 2020-05-13 ENCOUNTER — Other Ambulatory Visit: Payer: Self-pay | Admitting: Hematology

## 2020-05-13 ENCOUNTER — Other Ambulatory Visit: Payer: Self-pay

## 2020-05-13 ENCOUNTER — Inpatient Hospital Stay: Payer: BC Managed Care – PPO | Attending: Hematology

## 2020-05-13 ENCOUNTER — Inpatient Hospital Stay: Payer: BC Managed Care – PPO

## 2020-05-13 VITALS — BP 130/76 | HR 80 | Temp 98.6°F | Resp 18 | Wt 231.5 lb

## 2020-05-13 DIAGNOSIS — Z23 Encounter for immunization: Secondary | ICD-10-CM | POA: Insufficient documentation

## 2020-05-13 DIAGNOSIS — C50311 Malignant neoplasm of lower-inner quadrant of right female breast: Secondary | ICD-10-CM | POA: Insufficient documentation

## 2020-05-13 DIAGNOSIS — C7800 Secondary malignant neoplasm of unspecified lung: Secondary | ICD-10-CM

## 2020-05-13 DIAGNOSIS — Z452 Encounter for adjustment and management of vascular access device: Secondary | ICD-10-CM | POA: Insufficient documentation

## 2020-05-13 DIAGNOSIS — Z95828 Presence of other vascular implants and grafts: Secondary | ICD-10-CM

## 2020-05-13 DIAGNOSIS — C50911 Malignant neoplasm of unspecified site of right female breast: Secondary | ICD-10-CM

## 2020-05-13 DIAGNOSIS — Z5112 Encounter for antineoplastic immunotherapy: Secondary | ICD-10-CM | POA: Diagnosis present

## 2020-05-13 MED ORDER — HEPARIN SOD (PORK) LOCK FLUSH 100 UNIT/ML IV SOLN
500.0000 [IU] | Freq: Once | INTRAVENOUS | Status: AC | PRN
  Administered 2020-05-13: 500 [IU] via INTRAVENOUS
  Filled 2020-05-13: qty 5

## 2020-05-13 MED ORDER — ACETAMINOPHEN 325 MG PO TABS: 650.0000 mg | ORAL_TABLET | Freq: Once | ORAL | Status: DC

## 2020-05-13 MED ORDER — TRASTUZUMAB-HYALURONIDASE-OYSK 600-10000 MG-UNT/5ML ~~LOC~~ SOLN
600.0000 mg | Freq: Once | SUBCUTANEOUS | Status: AC
  Administered 2020-05-13: 600 mg via SUBCUTANEOUS
  Filled 2020-05-13: qty 5

## 2020-05-13 MED ORDER — SODIUM CHLORIDE 0.9% FLUSH
10.0000 mL | Freq: Once | INTRAVENOUS | Status: AC
  Administered 2020-05-13: 10 mL
  Filled 2020-05-13: qty 10

## 2020-05-13 MED ORDER — ACETAMINOPHEN 325 MG PO TABS
ORAL_TABLET | ORAL | Status: AC
  Filled 2020-05-13: qty 2

## 2020-05-13 NOTE — Patient Instructions (Signed)
Trastuzumab; Hyaluronidase injection What is this medicine? TRASTUZUMAB; HYALURONIDASE (tras TOO zoo mab / hye al ur ON i dase) is used to treat breast cancer and stomach cancer. Trastuzumab is a monoclonal antibody. Hyaluronidase is used to improve the effects of trastuzumab. This medicine may be used for other purposes; ask your health care provider or pharmacist if you have questions. COMMON BRAND NAME(S): HERCEPTIN HYLECTA What should I tell my health care provider before I take this medicine? They need to know if you have any of these conditions:  heart disease  heart failure  lung or breathing disease, like asthma  an unusual or allergic reaction to trastuzumab, or other medications, foods, dyes, or preservatives  pregnant or trying to get pregnant  breast-feeding How should I use this medicine? This medicine is for injection under the skin. It is given by a health care professional in a hospital or clinic setting. Talk to your pediatrician regarding the use of this medicine in children. This medicine is not approved for use in children. Overdosage: If you think you have taken too much of this medicine contact a poison control center or emergency room at once. NOTE: This medicine is only for you. Do not share this medicine with others. What if I miss a dose? It is important not to miss a dose. Call your doctor or health care professional if you are unable to keep an appointment. What may interact with this medicine? This medicine may interact with the following medications:  certain types of chemotherapy, such as daunorubicin, doxorubicin, epirubicin, and idarubicin This list may not describe all possible interactions. Give your health care provider a list of all the medicines, herbs, non-prescription drugs, or dietary supplements you use. Also tell them if you smoke, drink alcohol, or use illegal drugs. Some items may interact with your medicine. What should I watch for while  using this medicine? Visit your doctor for checks on your progress. Report any side effects. Continue your course of treatment even though you feel ill unless your doctor tells you to stop. Call your doctor or health care professional for advice if you get a fever, chills or sore throat, or other symptoms of a cold or flu. Do not treat yourself. Try to avoid being around people who are sick. You may experience fever, chills and shaking during your first infusion. These effects are usually mild and can be treated with other medicines. Report any side effects during the infusion to your health care professional. Fever and chills usually do not happen with later infusions. Do not become pregnant while taking this medicine or for 7 months after stopping it. Women should inform their doctor if they wish to become pregnant or think they might be pregnant. Women of child-bearing potential will need to have a negative pregnancy test before starting this medicine. There is a potential for serious side effects to an unborn child. Talk to your health care professional or pharmacist for more information. Do not breast-feed an infant while taking this medicine or for 7 months after stopping it. What side effects may I notice from receiving this medicine? Side effects that you should report to your doctor or health care professional as soon as possible:  allergic reactions like skin rash, itching or hives, swelling of the face, lips, or tongue  breathing problems  chest pain or palpitations  cough  fever  general ill feeling or flu-like symptoms  signs of worsening heart failure like breathing problems; swelling in your legs and  feet Side effects that usually do not require medical attention (report these to your doctor or health care professional if they continue or are bothersome):  bone pain  changes in taste  diarrhea  joint pain  nausea/vomiting  unusually weak or tired  weight loss This  list may not describe all possible side effects. Call your doctor for medical advice about side effects. You may report side effects to FDA at 1-800-FDA-1088. Where should I keep my medicine? This drug is given in a hospital or clinic and will not be stored at home. NOTE: This sheet is a summary. It may not cover all possible information. If you have questions about this medicine, talk to your doctor, pharmacist, or health care provider.  2020 Elsevier/Gold Standard (2017-10-18 21:54:17)

## 2020-05-19 ENCOUNTER — Other Ambulatory Visit (HOSPITAL_COMMUNITY): Payer: Self-pay | Admitting: Adult Health

## 2020-06-01 ENCOUNTER — Other Ambulatory Visit: Payer: Self-pay

## 2020-06-01 ENCOUNTER — Inpatient Hospital Stay: Payer: BC Managed Care – PPO

## 2020-06-01 VITALS — BP 142/72 | HR 70 | Temp 98.8°F | Resp 18

## 2020-06-01 DIAGNOSIS — C50911 Malignant neoplasm of unspecified site of right female breast: Secondary | ICD-10-CM

## 2020-06-01 DIAGNOSIS — T451X5A Adverse effect of antineoplastic and immunosuppressive drugs, initial encounter: Secondary | ICD-10-CM

## 2020-06-01 DIAGNOSIS — Z23 Encounter for immunization: Secondary | ICD-10-CM

## 2020-06-01 DIAGNOSIS — G620 Drug-induced polyneuropathy: Secondary | ICD-10-CM

## 2020-06-01 DIAGNOSIS — Z5112 Encounter for antineoplastic immunotherapy: Secondary | ICD-10-CM | POA: Diagnosis not present

## 2020-06-01 MED ORDER — GABAPENTIN 100 MG PO CAPS: 200.0000 mg | ORAL_CAPSULE | Freq: Every day | ORAL | 3 refills | Status: DC

## 2020-06-01 MED ORDER — INFLUENZA VAC SPLIT QUAD 0.5 ML IM SUSY
0.5000 mL | PREFILLED_SYRINGE | Freq: Once | INTRAMUSCULAR | Status: AC
  Administered 2020-06-01: 0.5 mL via INTRAMUSCULAR

## 2020-06-01 MED ORDER — TRASTUZUMAB-HYALURONIDASE-OYSK 600-10000 MG-UNT/5ML ~~LOC~~ SOLN
600.0000 mg | Freq: Once | SUBCUTANEOUS | Status: AC
  Administered 2020-06-01: 600 mg via SUBCUTANEOUS
  Filled 2020-06-01: qty 5

## 2020-06-01 MED ORDER — ACETAMINOPHEN 325 MG PO TABS: 650.0000 mg | ORAL_TABLET | Freq: Once | ORAL | Status: DC

## 2020-06-01 MED ORDER — INFLUENZA VAC SPLIT QUAD 0.5 ML IM SUSY
PREFILLED_SYRINGE | INTRAMUSCULAR | Status: AC
  Filled 2020-06-01: qty 0.5

## 2020-06-01 NOTE — Patient Instructions (Addendum)
Trastuzumab; Hyaluronidase injection What is this medicine? TRASTUZUMAB; HYALURONIDASE (tras TOO zoo mab / hye al ur ON i dase) is used to treat breast cancer and stomach cancer. Trastuzumab is a monoclonal antibody. Hyaluronidase is used to improve the effects of trastuzumab. This medicine may be used for other purposes; ask your health care provider or pharmacist if you have questions. COMMON BRAND NAME(S): HERCEPTIN HYLECTA What should I tell my health care provider before I take this medicine? They need to know if you have any of these conditions:  heart disease  heart failure  lung or breathing disease, like asthma  an unusual or allergic reaction to trastuzumab, or other medications, foods, dyes, or preservatives  pregnant or trying to get pregnant  breast-feeding How should I use this medicine? This medicine is for injection under the skin. It is given by a health care professional in a hospital or clinic setting. Talk to your pediatrician regarding the use of this medicine in children. This medicine is not approved for use in children. Overdosage: If you think you have taken too much of this medicine contact a poison control center or emergency room at once. NOTE: This medicine is only for you. Do not share this medicine with others. What if I miss a dose? It is important not to miss a dose. Call your doctor or health care professional if you are unable to keep an appointment. What may interact with this medicine? This medicine may interact with the following medications:  certain types of chemotherapy, such as daunorubicin, doxorubicin, epirubicin, and idarubicin This list may not describe all possible interactions. Give your health care provider a list of all the medicines, herbs, non-prescription drugs, or dietary supplements you use. Also tell them if you smoke, drink alcohol, or use illegal drugs. Some items may interact with your medicine. What should I watch for while  using this medicine? Visit your doctor for checks on your progress. Report any side effects. Continue your course of treatment even though you feel ill unless your doctor tells you to stop. Call your doctor or health care professional for advice if you get a fever, chills or sore throat, or other symptoms of a cold or flu. Do not treat yourself. Try to avoid being around people who are sick. You may experience fever, chills and shaking during your first infusion. These effects are usually mild and can be treated with other medicines. Report any side effects during the infusion to your health care professional. Fever and chills usually do not happen with later infusions. Do not become pregnant while taking this medicine or for 7 months after stopping it. Women should inform their doctor if they wish to become pregnant or think they might be pregnant. Women of child-bearing potential will need to have a negative pregnancy test before starting this medicine. There is a potential for serious side effects to an unborn child. Talk to your health care professional or pharmacist for more information. Do not breast-feed an infant while taking this medicine or for 7 months after stopping it. What side effects may I notice from receiving this medicine? Side effects that you should report to your doctor or health care professional as soon as possible:  allergic reactions like skin rash, itching or hives, swelling of the face, lips, or tongue  breathing problems  chest pain or palpitations  cough  fever  general ill feeling or flu-like symptoms  signs of worsening heart failure like breathing problems; swelling in your legs and  feet Side effects that usually do not require medical attention (report these to your doctor or health care professional if they continue or are bothersome):  bone pain  changes in taste  diarrhea  joint pain  nausea/vomiting  unusually weak or tired  weight loss This  list may not describe all possible side effects. Call your doctor for medical advice about side effects. You may report side effects to FDA at 1-800-FDA-1088. Where should I keep my medicine? This drug is given in a hospital or clinic and will not be stored at home. NOTE: This sheet is a summary. It may not cover all possible information. If you have questions about this medicine, talk to your doctor, pharmacist, or health care provider.  2020 Elsevier/Gold Standard (2017-10-18 21:54:17)  Influenza Virus Vaccine injection What is this medicine? INFLUENZA VIRUS VACCINE (in floo EN zuh VAHY ruhs vak SEEN) helps to reduce the risk of getting influenza also known as the flu. The vaccine only helps protect you against some strains of the flu. This medicine may be used for other purposes; ask your health care provider or pharmacist if you have questions. COMMON BRAND NAME(S): Afluria, Afluria Quadrivalent, Agriflu, Alfuria, FLUAD, Fluarix, Fluarix Quadrivalent, Flublok, Flublok Quadrivalent, FLUCELVAX, FLUCELVAX Quadrivalent, Flulaval, Flulaval Quadrivalent, Fluvirin, Fluzone, Fluzone High-Dose, Fluzone Intradermal, Fluzone Quadrivalent What should I tell my health care provider before I take this medicine? They need to know if you have any of these conditions:  bleeding disorder like hemophilia  fever or infection  Guillain-Barre syndrome or other neurological problems  immune system problems  infection with the human immunodeficiency virus (HIV) or AIDS  low blood platelet counts  multiple sclerosis  an unusual or allergic reaction to influenza virus vaccine, latex, other medicines, foods, dyes, or preservatives. Different brands of vaccines contain different allergens. Some may contain latex or eggs. Talk to your doctor about your allergies to make sure that you get the right vaccine.  pregnant or trying to get pregnant  breast-feeding How should I use this medicine? This vaccine is  for injection into a muscle or under the skin. It is given by a health care professional. A copy of Vaccine Information Statements will be given before each vaccination. Read this sheet carefully each time. The sheet may change frequently. Talk to your healthcare provider to see which vaccines are right for you. Some vaccines should not be used in all age groups. Overdosage: If you think you have taken too much of this medicine contact a poison control center or emergency room at once. NOTE: This medicine is only for you. Do not share this medicine with others. What if I miss a dose? This does not apply. What may interact with this medicine?  chemotherapy or radiation therapy  medicines that lower your immune system like etanercept, anakinra, infliximab, and adalimumab  medicines that treat or prevent blood clots like warfarin  phenytoin  steroid medicines like prednisone or cortisone  theophylline  vaccines This list may not describe all possible interactions. Give your health care provider a list of all the medicines, herbs, non-prescription drugs, or dietary supplements you use. Also tell them if you smoke, drink alcohol, or use illegal drugs. Some items may interact with your medicine. What should I watch for while using this medicine? Report any side effects that do not go away within 3 days to your doctor or health care professional. Call your health care provider if any unusual symptoms occur within 6 weeks of receiving this vaccine. You  may still catch the flu, but the illness is not usually as bad. You cannot get the flu from the vaccine. The vaccine will not protect against colds or other illnesses that may cause fever. The vaccine is needed every year. What side effects may I notice from receiving this medicine? Side effects that you should report to your doctor or health care professional as soon as possible:  allergic reactions like skin rash, itching or hives, swelling of  the face, lips, or tongue Side effects that usually do not require medical attention (report to your doctor or health care professional if they continue or are bothersome):  fever  headache  muscle aches and pains  pain, tenderness, redness, or swelling at the injection site  tiredness This list may not describe all possible side effects. Call your doctor for medical advice about side effects. You may report side effects to FDA at 1-800-FDA-1088. Where should I keep my medicine? The vaccine will be given by a health care professional in a clinic, pharmacy, doctor's office, or other health care setting. You will not be given vaccine doses to store at home. NOTE: This sheet is a summary. It may not cover all possible information. If you have questions about this medicine, talk to your doctor, pharmacist, or health care provider.  2020 Elsevier/Gold Standard (2018-06-24 08:45:43)

## 2020-06-22 ENCOUNTER — Inpatient Hospital Stay: Payer: BC Managed Care – PPO | Attending: Hematology

## 2020-06-22 ENCOUNTER — Other Ambulatory Visit: Payer: Self-pay

## 2020-06-22 ENCOUNTER — Inpatient Hospital Stay: Payer: BC Managed Care – PPO

## 2020-06-22 VITALS — BP 144/61 | HR 66 | Temp 98.2°F | Resp 16

## 2020-06-22 DIAGNOSIS — C50911 Malignant neoplasm of unspecified site of right female breast: Secondary | ICD-10-CM

## 2020-06-22 DIAGNOSIS — C7800 Secondary malignant neoplasm of unspecified lung: Secondary | ICD-10-CM

## 2020-06-22 DIAGNOSIS — Z5112 Encounter for antineoplastic immunotherapy: Secondary | ICD-10-CM | POA: Diagnosis present

## 2020-06-22 DIAGNOSIS — C50311 Malignant neoplasm of lower-inner quadrant of right female breast: Secondary | ICD-10-CM | POA: Diagnosis present

## 2020-06-22 DIAGNOSIS — Z95828 Presence of other vascular implants and grafts: Secondary | ICD-10-CM

## 2020-06-22 LAB — COMPREHENSIVE METABOLIC PANEL
ALT: 21 U/L (ref 0–44)
AST: 17 U/L (ref 15–41)
Albumin: 3.7 g/dL (ref 3.5–5.0)
Alkaline Phosphatase: 65 U/L (ref 38–126)
Anion gap: 8 (ref 5–15)
BUN: 13 mg/dL (ref 6–20)
CO2: 27 mmol/L (ref 22–32)
Calcium: 8.9 mg/dL (ref 8.9–10.3)
Chloride: 106 mmol/L (ref 98–111)
Creatinine, Ser: 0.9 mg/dL (ref 0.44–1.00)
GFR, Estimated: >60 mL/min (ref >60)
Glucose, Bld: 113 mg/dL — ABNORMAL HIGH (ref 70–99)
Potassium: 3.5 mmol/L (ref 3.5–5.1)
Sodium: 141 mmol/L (ref 135–145)
Total Bilirubin: 0.6 mg/dL (ref 0.3–1.2)
Total Protein: 7.9 g/dL (ref 6.5–8.1)

## 2020-06-22 LAB — CBC WITH DIFFERENTIAL/PLATELET
Abs Immature Granulocytes: 0.02 10*3/uL (ref 0.00–0.07)
Basophils Absolute: 0 10*3/uL (ref 0.0–0.1)
Basophils Relative: 0 %
Eosinophils Absolute: 0.1 10*3/uL (ref 0.0–0.5)
Eosinophils Relative: 1 %
HCT: 36 % (ref 36.0–46.0)
Hemoglobin: 11.9 g/dL — ABNORMAL LOW (ref 12.0–15.0)
Immature Granulocytes: 0 %
Lymphocytes Relative: 30 %
Lymphs Abs: 2.2 10*3/uL (ref 0.7–4.0)
MCH: 29.9 pg (ref 26.0–34.0)
MCHC: 33.1 g/dL (ref 30.0–36.0)
MCV: 90.5 fL (ref 80.0–100.0)
Monocytes Absolute: 0.5 10*3/uL (ref 0.1–1.0)
Monocytes Relative: 7 %
Neutro Abs: 4.5 10*3/uL (ref 1.7–7.7)
Neutrophils Relative %: 62 %
Platelets: 231 10*3/uL (ref 150–400)
RBC: 3.98 MIL/uL (ref 3.87–5.11)
RDW: 13.2 % (ref 11.5–15.5)
WBC: 7.3 10*3/uL (ref 4.0–10.5)
nRBC: 0 % (ref 0.0–0.2)

## 2020-06-22 MED ORDER — TRASTUZUMAB-HYALURONIDASE-OYSK 600-10000 MG-UNT/5ML ~~LOC~~ SOLN
600.0000 mg | Freq: Once | SUBCUTANEOUS | Status: AC
  Administered 2020-06-22: 600 mg via SUBCUTANEOUS
  Filled 2020-06-22: qty 5

## 2020-06-22 MED ORDER — SODIUM CHLORIDE 0.9% FLUSH
10.0000 mL | Freq: Once | INTRAVENOUS | Status: AC
  Administered 2020-06-22: 10 mL
  Filled 2020-06-22: qty 10

## 2020-06-22 MED ORDER — ACETAMINOPHEN 325 MG PO TABS: 650.0000 mg | ORAL_TABLET | Freq: Once | ORAL | Status: DC

## 2020-06-22 MED ORDER — HEPARIN SOD (PORK) LOCK FLUSH 100 UNIT/ML IV SOLN
500.0000 [IU] | Freq: Once | INTRAVENOUS | Status: AC
  Administered 2020-06-22: 500 [IU] via INTRAVENOUS
  Filled 2020-06-22: qty 5

## 2020-06-22 NOTE — Patient Instructions (Signed)

## 2020-06-22 NOTE — Patient Instructions (Signed)
Trastuzumab; Hyaluronidase injection What is this medicine? TRASTUZUMAB; HYALURONIDASE (tras TOO zoo mab / hye al ur ON i dase) is used to treat breast cancer and stomach cancer. Trastuzumab is a monoclonal antibody. Hyaluronidase is used to improve the effects of trastuzumab. This medicine may be used for other purposes; ask your health care provider or pharmacist if you have questions. COMMON BRAND NAME(S): HERCEPTIN HYLECTA What should I tell my health care provider before I take this medicine? They need to know if you have any of these conditions:  heart disease  heart failure  lung or breathing disease, like asthma  an unusual or allergic reaction to trastuzumab, or other medications, foods, dyes, or preservatives  pregnant or trying to get pregnant  breast-feeding How should I use this medicine? This medicine is for injection under the skin. It is given by a health care professional in a hospital or clinic setting. Talk to your pediatrician regarding the use of this medicine in children. This medicine is not approved for use in children. Overdosage: If you think you have taken too much of this medicine contact a poison control center or emergency room at once. NOTE: This medicine is only for you. Do not share this medicine with others. What if I miss a dose? It is important not to miss a dose. Call your doctor or health care professional if you are unable to keep an appointment. What may interact with this medicine? This medicine may interact with the following medications:  certain types of chemotherapy, such as daunorubicin, doxorubicin, epirubicin, and idarubicin This list may not describe all possible interactions. Give your health care provider a list of all the medicines, herbs, non-prescription drugs, or dietary supplements you use. Also tell them if you smoke, drink alcohol, or use illegal drugs. Some items may interact with your medicine. What should I watch for while  using this medicine? Visit your doctor for checks on your progress. Report any side effects. Continue your course of treatment even though you feel ill unless your doctor tells you to stop. Call your doctor or health care professional for advice if you get a fever, chills or sore throat, or other symptoms of a cold or flu. Do not treat yourself. Try to avoid being around people who are sick. You may experience fever, chills and shaking during your first infusion. These effects are usually mild and can be treated with other medicines. Report any side effects during the infusion to your health care professional. Fever and chills usually do not happen with later infusions. Do not become pregnant while taking this medicine or for 7 months after stopping it. Women should inform their doctor if they wish to become pregnant or think they might be pregnant. Women of child-bearing potential will need to have a negative pregnancy test before starting this medicine. There is a potential for serious side effects to an unborn child. Talk to your health care professional or pharmacist for more information. Do not breast-feed an infant while taking this medicine or for 7 months after stopping it. What side effects may I notice from receiving this medicine? Side effects that you should report to your doctor or health care professional as soon as possible:  allergic reactions like skin rash, itching or hives, swelling of the face, lips, or tongue  breathing problems  chest pain or palpitations  cough  fever  general ill feeling or flu-like symptoms  signs of worsening heart failure like breathing problems; swelling in your legs and  feet Side effects that usually do not require medical attention (report these to your doctor or health care professional if they continue or are bothersome):  bone pain  changes in taste  diarrhea  joint pain  nausea/vomiting  unusually weak or tired  weight loss This  list may not describe all possible side effects. Call your doctor for medical advice about side effects. You may report side effects to FDA at 1-800-FDA-1088. Where should I keep my medicine? This drug is given in a hospital or clinic and will not be stored at home. NOTE: This sheet is a summary. It may not cover all possible information. If you have questions about this medicine, talk to your doctor, pharmacist, or health care provider.  2020 Elsevier/Gold Standard (2017-10-18 21:54:17)  Influenza Virus Vaccine injection What is this medicine? INFLUENZA VIRUS VACCINE (in floo EN zuh VAHY ruhs vak SEEN) helps to reduce the risk of getting influenza also known as the flu. The vaccine only helps protect you against some strains of the flu. This medicine may be used for other purposes; ask your health care provider or pharmacist if you have questions. COMMON BRAND NAME(S): Afluria, Afluria Quadrivalent, Agriflu, Alfuria, FLUAD, Fluarix, Fluarix Quadrivalent, Flublok, Flublok Quadrivalent, FLUCELVAX, FLUCELVAX Quadrivalent, Flulaval, Flulaval Quadrivalent, Fluvirin, Fluzone, Fluzone High-Dose, Fluzone Intradermal, Fluzone Quadrivalent What should I tell my health care provider before I take this medicine? They need to know if you have any of these conditions:  bleeding disorder like hemophilia  fever or infection  Guillain-Barre syndrome or other neurological problems  immune system problems  infection with the human immunodeficiency virus (HIV) or AIDS  low blood platelet counts  multiple sclerosis  an unusual or allergic reaction to influenza virus vaccine, latex, other medicines, foods, dyes, or preservatives. Different brands of vaccines contain different allergens. Some may contain latex or eggs. Talk to your doctor about your allergies to make sure that you get the right vaccine.  pregnant or trying to get pregnant  breast-feeding How should I use this medicine? This vaccine is  for injection into a muscle or under the skin. It is given by a health care professional. A copy of Vaccine Information Statements will be given before each vaccination. Read this sheet carefully each time. The sheet may change frequently. Talk to your healthcare provider to see which vaccines are right for you. Some vaccines should not be used in all age groups. Overdosage: If you think you have taken too much of this medicine contact a poison control center or emergency room at once. NOTE: This medicine is only for you. Do not share this medicine with others. What if I miss a dose? This does not apply. What may interact with this medicine?  chemotherapy or radiation therapy  medicines that lower your immune system like etanercept, anakinra, infliximab, and adalimumab  medicines that treat or prevent blood clots like warfarin  phenytoin  steroid medicines like prednisone or cortisone  theophylline  vaccines This list may not describe all possible interactions. Give your health care provider a list of all the medicines, herbs, non-prescription drugs, or dietary supplements you use. Also tell them if you smoke, drink alcohol, or use illegal drugs. Some items may interact with your medicine. What should I watch for while using this medicine? Report any side effects that do not go away within 3 days to your doctor or health care professional. Call your health care provider if any unusual symptoms occur within 6 weeks of receiving this vaccine. You  may still catch the flu, but the illness is not usually as bad. You cannot get the flu from the vaccine. The vaccine will not protect against colds or other illnesses that may cause fever. The vaccine is needed every year. What side effects may I notice from receiving this medicine? Side effects that you should report to your doctor or health care professional as soon as possible:  allergic reactions like skin rash, itching or hives, swelling of  the face, lips, or tongue Side effects that usually do not require medical attention (report to your doctor or health care professional if they continue or are bothersome):  fever  headache  muscle aches and pains  pain, tenderness, redness, or swelling at the injection site  tiredness This list may not describe all possible side effects. Call your doctor for medical advice about side effects. You may report side effects to FDA at 1-800-FDA-1088. Where should I keep my medicine? The vaccine will be given by a health care professional in a clinic, pharmacy, doctor's office, or other health care setting. You will not be given vaccine doses to store at home. NOTE: This sheet is a summary. It may not cover all possible information. If you have questions about this medicine, talk to your doctor, pharmacist, or health care provider.  2020 Elsevier/Gold Standard (2018-06-24 08:45:43)

## 2020-07-13 ENCOUNTER — Other Ambulatory Visit: Payer: Self-pay

## 2020-07-13 ENCOUNTER — Inpatient Hospital Stay: Payer: BC Managed Care – PPO | Attending: Hematology

## 2020-07-13 VITALS — BP 158/68 | HR 77 | Temp 98.3°F | Resp 16

## 2020-07-13 DIAGNOSIS — Z452 Encounter for adjustment and management of vascular access device: Secondary | ICD-10-CM | POA: Insufficient documentation

## 2020-07-13 DIAGNOSIS — C50311 Malignant neoplasm of lower-inner quadrant of right female breast: Secondary | ICD-10-CM | POA: Diagnosis present

## 2020-07-13 DIAGNOSIS — C50911 Malignant neoplasm of unspecified site of right female breast: Secondary | ICD-10-CM

## 2020-07-13 DIAGNOSIS — Z5112 Encounter for antineoplastic immunotherapy: Secondary | ICD-10-CM | POA: Insufficient documentation

## 2020-07-13 MED ORDER — TRASTUZUMAB-HYALURONIDASE-OYSK 600-10000 MG-UNT/5ML ~~LOC~~ SOLN
600.0000 mg | Freq: Once | SUBCUTANEOUS | Status: AC
  Administered 2020-07-13: 600 mg via SUBCUTANEOUS
  Filled 2020-07-13: qty 5

## 2020-07-13 MED ORDER — ACETAMINOPHEN 325 MG PO TABS: 650.0000 mg | ORAL_TABLET | Freq: Once | ORAL | Status: DC

## 2020-07-13 NOTE — Patient Instructions (Signed)
[facility] Discharge Instructions for Patients Receiving Chemotherapy  Today you received the following chemotherapy agents trastuzumab.  To help prevent nausea and vomiting after your treatment, we encourage you to take your nausea medication as directed.    If you develop nausea and vomiting that is not controlled by your nausea medication, call the clinic.   BELOW ARE SYMPTOMS THAT SHOULD BE REPORTED IMMEDIATELY:  *FEVER GREATER THAN 100.5 F  *CHILLS WITH OR WITHOUT FEVER  NAUSEA AND VOMITING THAT IS NOT CONTROLLED WITH YOUR NAUSEA MEDICATION  *UNUSUAL SHORTNESS OF BREATH  *UNUSUAL BRUISING OR BLEEDING  TENDERNESS IN MOUTH AND THROAT WITH OR WITHOUT PRESENCE OF ULCERS  *URINARY PROBLEMS  *BOWEL PROBLEMS  UNUSUAL RASH Items with * indicate a potential emergency and should be followed up as soon as possible.  Feel free to call the clinic should you have any questions or concerns. The clinic phone number is [phone].  Please show the CHEMO ALERT CARD at check-in to the Emergency Department and triage nurse.   

## 2020-08-03 ENCOUNTER — Inpatient Hospital Stay: Payer: BC Managed Care – PPO

## 2020-08-03 ENCOUNTER — Other Ambulatory Visit: Payer: Self-pay

## 2020-08-03 VITALS — BP 154/72 | HR 85 | Temp 98.9°F | Resp 17

## 2020-08-03 DIAGNOSIS — Z5112 Encounter for antineoplastic immunotherapy: Secondary | ICD-10-CM | POA: Diagnosis not present

## 2020-08-03 DIAGNOSIS — C50911 Malignant neoplasm of unspecified site of right female breast: Secondary | ICD-10-CM

## 2020-08-03 DIAGNOSIS — Z95828 Presence of other vascular implants and grafts: Secondary | ICD-10-CM

## 2020-08-03 MED ORDER — SODIUM CHLORIDE 0.9% FLUSH
10.0000 mL | Freq: Once | INTRAVENOUS | Status: AC
  Administered 2020-08-03: 15:00:00 10 mL
  Filled 2020-08-03: qty 10

## 2020-08-03 MED ORDER — TRASTUZUMAB-HYALURONIDASE-OYSK 600-10000 MG-UNT/5ML ~~LOC~~ SOLN
600.0000 mg | Freq: Once | SUBCUTANEOUS | Status: AC
  Administered 2020-08-03: 16:00:00 600 mg via SUBCUTANEOUS
  Filled 2020-08-03: qty 5

## 2020-08-03 MED ORDER — HEPARIN SOD (PORK) LOCK FLUSH 100 UNIT/ML IV SOLN
500.0000 [IU] | Freq: Once | INTRAVENOUS | Status: AC | PRN
  Administered 2020-08-03: 500 [IU] via INTRAVENOUS
  Filled 2020-08-03: qty 5

## 2020-08-03 MED ORDER — ACETAMINOPHEN 325 MG PO TABS: 650.0000 mg | ORAL_TABLET | Freq: Once | ORAL | Status: DC

## 2020-08-03 NOTE — Patient Instructions (Signed)
[facility] Discharge Instructions for Patients Receiving Chemotherapy  Today you received the following chemotherapy agents trastuzumab.  To help prevent nausea and vomiting after your treatment, we encourage you to take your nausea medication as directed.    If you develop nausea and vomiting that is not controlled by your nausea medication, call the clinic.   BELOW ARE SYMPTOMS THAT SHOULD BE REPORTED IMMEDIATELY:  *FEVER GREATER THAN 100.5 F  *CHILLS WITH OR WITHOUT FEVER  NAUSEA AND VOMITING THAT IS NOT CONTROLLED WITH YOUR NAUSEA MEDICATION  *UNUSUAL SHORTNESS OF BREATH  *UNUSUAL BRUISING OR BLEEDING  TENDERNESS IN MOUTH AND THROAT WITH OR WITHOUT PRESENCE OF ULCERS  *URINARY PROBLEMS  *BOWEL PROBLEMS  UNUSUAL RASH Items with * indicate a potential emergency and should be followed up as soon as possible.  Feel free to call the clinic should you have any questions or concerns. The clinic phone number is [phone].  Please show the CHEMO ALERT CARD at check-in to the Emergency Department and triage nurse.   

## 2020-08-10 ENCOUNTER — Ambulatory Visit: Payer: BC Managed Care – PPO | Admitting: Hematology

## 2020-08-15 ENCOUNTER — Inpatient Hospital Stay: Payer: BC Managed Care – PPO | Admitting: Hematology

## 2020-08-15 DIAGNOSIS — C50911 Malignant neoplasm of unspecified site of right female breast: Secondary | ICD-10-CM

## 2020-08-15 NOTE — Progress Notes (Incomplete)
[place]   [phone] [phone]   Clinic Follow up Note   Patient Care Team: Virginia Rochester, [place] as PCP - General (Family Medicine) [name], DO (Family Medicine) Holley Bouche, NP (Inactive) as Nurse Practitioner (Nurse Practitioner) Stark Klein, MD as Consulting Physician (General Surgery) Arloa Koh, MD (Inactive) as Consulting Physician (Radiation Oncology) [name], MD as Consulting Physician (Hematology) Clydie Braun, MD (Obstetrics and Gynecology) Haroldine Laws, Shaune Pascal, MD as Consulting Physician (Cardiology)  Date of Service:  08/15/2020  CHIEF COMPLAINT: F/u right breast cancer  SUMMARY OF ONCOLOGIC HISTORY: Oncology History Overview Note  Breast cancer 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   Staging form: Breast, AJCC 7th Edition     Clinical stage from 07/22/2014: Stage IV (T3, N1, M1) - Unsigned     Breast cancer 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 ([place])  07/02/2014 Mammogram   Mammogram showed a 2cm right beast mass and a 1.8cm right axillary node. MRI breast on 07/16/2014 showed 7cm R breast lesion and 4.4cm r axillary node    07/02/2014 Imaging   CT CAP: a 4.7cm mass in LUL lung and a 2.1cm mas in RML, and a small nodule in RUL, suspecious for metastasis     07/09/2014 Initial Diagnosis   right IDA with b/l lung lesions, ER-/PR-/HER2+   07/09/2014 Initial Biopsy   US guided right breast mass and axillary node biopsy showed IDA, and DCIS, ER-/PR-/HER2+   07/26/2014 Pathologic Stage   Left lung mass by IR, path revealed high grade carcinoma, morphology similar to breast tumor biopsy, TTF(-), NapsinA(-), ER(-)   08/04/2014 - 03/22/2015 Chemotherapy   weekly Paclitaxel 17m/m2, trastuzumab and pertuzumab every 3 weeks   08/30/2014 Genetic Testing   BreastNext panel was negative. 17 genes including BRCA1, BRCA2, were negative for mutations.    10/04/2014 Imaging   Interval decrease in the right axillary lymphadenopathy. Bilateral  pulmonary lesions with left hilar lymphadenopathy also markedly decreased in the interval. The left hilar lymphadenopathy has resolved.   12/20/2014 Imaging   restaging CT showed stable disease, no new lesions    03/29/2015 Pathology Results    right breast lumpectomy showed  chemotherapy treatment effect,  a 1 mm residual tumor,   margins were widely negative, 5 sentinel lymph nodes and 2 axillary lymph nodes were negative.    03/29/2015 Surgery    right breast lumpectomy and sentinel lymph node biopsy  by Dr. BBarry Dienes  04/12/2015 -  Chemotherapy   Herceptin maintenance therapy , 6 mg/kg, every 3 weeks since 04/12/15. Switched to Herceptin Hylecta injection q3weeks after 06/10/19.   05/03/2015 - 06/14/2015 Radiation Therapy   right breast adjuvant irradiation by Dr. MValere Dross   08/28/2016 Imaging   CT CAP w Contrast  IMPRESSION: 1. No acute process or evidence of metastatic disease within the chest, abdomen, or pelvis. 2. Similar to less well-defined left upper lobe density, likely scarring. No evidence of new or progressive pulmonary metastasis. 3. Right nephrolithiasis.   01/22/2017 Imaging   CT Chest W Contrast 01/22/17 IMPRESSION: 1. Stable exam. No new or progressive findings. No evidence for metastatic disease. 2. Left upper lobe architectural distortion/scarring is stable. 3. Nonobstructing right renal stone.   07/31/2017 Imaging   Bone Scan Whole Body 07/31/17  IMPRESSION: 1. No scintigraphic evidence of osseous metastatic disease. 2. Thoracolumbar scoliosis.    07/31/2017 Imaging   CT CAP W Contrast 07/31/17 IMPRESSION: 1. No findings of active or recurrent malignancy. 2. Other imaging findings of potential clinical significance: Aortic  Atherosclerosis (ICD10-I70.0). Mild cardiomegaly. Postoperative and radiation therapy findings in the right chest. Nonobstructive right nephrolithiasis. Thoracolumbar scoliosis.    01/20/2018 Echocardiogram   ECHO 01/20/2018 Study  Conclusions  - Left ventricle: The cavity size was normal. Wall thickness was   normal. Systolic function was normal. The estimated ejection   fraction was in the range of 55% to 60%. Wall motion was normal;   there were no regional wall motion abnormalities. Doppler   parameters are consistent with abnormal left ventricular   relaxation (grade 1 diastolic dysfunction). - Impressions: GLS -20.7% LS&' 10.4 cm.    02/17/2018 Imaging   02/17/2018 CT CAP IMPRESSION: 1. No evidence for residual or recurrent tumor or metastatic disease. 2.  Aortic Atherosclerosis (ICD10-I70.0). 3. Right renal calculi. 4. Scoliosis.   08/11/2018 Imaging   CT CAP W Contrast 08/11/18  IMPRESSION: No evidence for localized recurrence or metastatic disease in the chest, abdomen or pelvis.   02/23/2019 Imaging   CT CAP W Contrast  IMPRESSION: 1. No findings of active malignancy in the chest, abdomen, or pelvis. 2. 3 mm right kidney lower pole nonobstructive renal calculus. 3. Thoracolumbar scoliosis.   12/08/2019 Imaging   CT CAP W contrast  IMPRESSION: 1. Unchanged post treatment appearance of the lungs. No evidence of recurrent or new metastatic disease in the chest, abdomen, or pelvis.   2. Postoperative findings of right lumpectomy and axillary lymph node dissection.   3.  Nonobstructive right nephrolithiasis.      CURRENT THERAPY:  MaintenanceHerceptin every 3 weeks since 04/12/15. Switched to Herceptin Hylecta injection q3weeks after 06/10/19.  INTERVAL HISTORY: *** [name] [name] is here for a follow up and ongoing treatment. She was last seen by me 5 months ago. She presents to the clinic alone.    REVIEW OF SYSTEMS:  *** Constitutional: Denies fevers, chills or abnormal weight loss Eyes: Denies blurriness of vision Ears, nose, mouth, throat, and face: Denies mucositis or sore throat Respiratory: Denies cough, dyspnea or wheezes Cardiovascular: Denies palpitation, chest  discomfort or lower extremity swelling Gastrointestinal:  Denies nausea, heartburn or change in bowel habits Skin: Denies abnormal skin rashes Lymphatics: Denies new lymphadenopathy or easy bruising Neurological:Denies numbness, tingling or new weaknesses Behavioral/Psych: Mood is stable, no new changes  All other systems were reviewed with the patient and are negative.  MEDICAL HISTORY:  Past Medical History:  Diagnosis Date  . Breast cancer ([place])   . Breast cancer ([place])   . GERD (gastroesophageal reflux disease)    during pregnancy   . Pneumonia    hx of pneumonia 08/2013   . S/P radiation therapy 05/03/2015 through 06/14/2015    Right breast 4680 cGy in 26 sessions, right breast boost 1000 cGy in 5 sessions. Right supraclavicular/axillary region 4680 cGy with a supplemental PA field to bring the axillary dose up to 4500 cGy in 26 sessions    SURGICAL HISTORY: Past Surgical History:  Procedure Laterality Date  . BREAST LUMPECTOMY WITH RADIOACTIVE SEED AND SENTINEL LYMPH NODE BIOPSY Right 03/29/2015   Procedure: BREAST LUMPECTOMY WITH RADIOACTIVE SEED AND SENTINEL LYMPH NODE BIOPSY;  Surgeon: Stark Klein, MD;  Location: [facility];  Service: General;  Laterality: Right;  . CESAREAN SECTION    . CHOLECYSTECTOMY    . ESSURE TUBAL LIGATION    . PORTACATH PLACEMENT Left 07/21/2014   Procedure: INSERTION PORT-A-CATH;  Surgeon: Stark Klein, MD;  Location: [facility];  Service: General;  Laterality: Left;  . WISDOM TOOTH EXTRACTION  I have reviewed the social history and family history with the patient and they are unchanged from previous note.  ALLERGIES:  is allergic to adhesive [tape], aspirin, and codeine.  MEDICATIONS:  Current Outpatient Medications  Medication Sig Dispense Refill  . acetaminophen (TYLENOL) 500 MG tablet Take 500 mg by mouth every 6 (six) hours as needed.    . Ascorbic Acid (VITAMIN C) 500 MG  CHEW     . Calcium-Phosphorus-Vitamin D (CALCIUM GUMMIES PO) Take by mouth daily.    . Camphor-Eucalyptus-Menthol (VICKS VAPORUB EX) Apply 1 application topically at bedtime. Applies under nose, throat and on chest.    . cetirizine (ZYRTEC) 10 MG tablet Take 10 mg by mouth daily as needed for allergies (allergies).     . clobetasol (TEMOVATE) 0.05 % external solution Apply 1 application topically 2 (two) times daily.    . fluticasone (FLONASE) 50 MCG/ACT nasal spray Place into both nostrils daily.    Marland Kitchen gabapentin (NEURONTIN) 100 MG capsule Take 2 capsules (200 mg total) by mouth at bedtime. 60 capsule 3  . hydrocortisone 2.5 % cream Apply 1 application topically 2 (two) times daily as needed.    Marland Kitchen ibuprofen (ADVIL,MOTRIN) 800 MG tablet Take 1 tablet (800 mg total) by mouth every 8 (eight) hours as needed. 60 tablet 1  . lidocaine-prilocaine (EMLA) cream APPLY TOPICALLY TO THE AFFECTED AREA AS NEEDED 30 g 2  . loperamide (IMODIUM A-D) 2 MG tablet Take 1 tablet (2 mg total) by mouth 4 (four) times daily as needed for diarrhea or loose stools. 30 tablet 0  . losartan (COZAAR) 25 MG tablet Take 1 tablet (25 mg total) by mouth daily. 30 tablet 6  . misoprostol (CYTOTEC) 200 MCG tablet Take 400 mcg by mouth 2 (two) times daily.    . Multiple Vitamins-Minerals (MULTIVITAMIN GUMMIES ADULT PO) Take 1 each by mouth daily. Women's Vitafusion gummie    . spironolactone (ALDACTONE) 25 MG tablet TAKE 1 TABLET BY MOUTH DAILY 90 tablet 3  . triamcinolone (KENALOG) 0.025 % ointment Apply 1 application topically as needed.     No current facility-administered medications for this visit.   Facility-Administered Medications Ordered in Other Visits  Medication Dose Route Frequency Provider Last Rate Last Admin  . acetaminophen (TYLENOL) tablet 650 mg  650 mg Oral Once [name], MD      . heparin lock flush 100 unit/mL  500 Units Intracatheter Once PRN [name], MD      . sodium chloride 0.9 % injection 10 mL  10  mL Intracatheter PRN [name], MD   10 mL at 05/01/16 1622  . sodium chloride 0.9 % injection 10 mL  10 mL Intravenous PRN [name], MD      . sodium chloride 0.9 % injection 10 mL  10 mL Intracatheter PRN [name], MD   10 mL at 03/18/19 1715  . sodium chloride flush (NS) 0.9 % injection 10 mL  10 mL Intravenous PRN [name], MD        PHYSICAL EXAMINATION: ECOG PERFORMANCE STATUS: {CHL ONC ECOG BB:[id number]}  There were no vitals filed for this visit. There were no vitals filed for this visit. *** GENERAL:alert, no distress and comfortable SKIN: skin color, texture, turgor are normal, no rashes or significant lesions EYES: normal, Conjunctiva are pink and non-injected, sclera clear {OROPHARYNX:no exudate, no erythema and lips, buccal mucosa, and tongue normal}  NECK: supple, thyroid normal size, non-tender, without nodularity LYMPH:  no palpable lymphadenopathy in the cervical,  axillary {or inguinal} LUNGS: clear to auscultation and percussion with normal breathing effort HEART: regular rate & rhythm and no murmurs and no lower extremity edema ABDOMEN:abdomen soft, non-tender and normal bowel sounds Musculoskeletal:no cyanosis of digits and no clubbing  NEURO: alert & oriented x 3 with fluent speech, no focal motor/sensory deficits  LABORATORY DATA:  I have reviewed the data as listed CBC Latest Ref Rng & Units 06/22/2020 03/30/2020 01/27/2020  WBC 4.0 - 10.5 K/uL 7.3 7.3 7.7  Hemoglobin 12.0 - 15.0 g/dL 11.9(L) 12.0 11.8(L)  Hematocrit 36.0 - 46.0 % 36.0 36.3 36.2  Platelets 150 - 400 K/uL 231 215 229     CMP Latest Ref Rng & Units 06/22/2020 03/30/2020 01/27/2020  Glucose 70 - 99 mg/dL 113(H) 110(H) 120(H)  BUN 6 - 20 mg/dL 13 15 16   Creatinine 0.44 - 1.00 mg/dL 0.90 0.96 0.99  Sodium 135 - 145 mmol/L 141 140 139  Potassium 3.5 - 5.1 mmol/L 3.5 3.8 3.8  Chloride 98 - 111 mmol/L 106 105 104  CO2 22 - 32 mmol/L 27 28 26   Calcium 8.9 - 10.3 mg/dL 8.9 9.8 8.8(L)  Total  Protein 6.5 - 8.1 g/dL 7.9 7.8 7.6  Total Bilirubin 0.3 - 1.2 mg/dL 0.6 0.4 0.3  Alkaline Phos 38 - 126 U/L 65 72 71  AST 15 - 41 U/L 17 19 12(L)  ALT 0 - 44 U/L 21 23 12       RADIOGRAPHIC STUDIES: I have personally reviewed the radiological images as listed and agreed with the findings in the report. No results found.   ASSESSMENT & PLAN:  [name] [name] is a 50 y.o. 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 Tolerating well. -She underwent hysteroscopy with D&C on 11/27/18 to remove benign polyps that were recently found. -Her CT CAP from 12/08/19 showedstable post treatment appearance of the lungs. No evidence of recurrence or new metastatic disease.  -She has been on Herceptin for almost 5 years. I discussed after 5 years we will repeat scan and ECHOs every 6 months. I discussed her case is unusual given herprolonged disease free status although she is still at risk for recurrence. I discussed that there is no date to support stopping Herceptin given the initial metastatic disease. However I may consider stopping her Herceptin if she remains NED after 10 years. She is agreeable. -She is clinically doing well and stable. Labs reviewed, CBC and CMP WNL. She will proceed with Herceptin today and continue every 3 weeks.  -Since she is doing very well, will space out her labs to every 12 weeks, continue port flush every 6 weeks,follow-up to every 4 months, and scanevery 12 months.   2. HTN -On losartangivingby Dr. BHaroldine Lawsin June 2019, dose increasedin 08/2018. -07/22/18 ECHO showed EF at 60-65%, GLS 22%  3. Intermittent Neuropathy  -Residual effects from her prior chemo treatment -She notes her intermittent  neuropathy is happening more frequently, mostly at night or relaxed with tingling in hands and LE but mainly in left Lower leg.  -Due to tingling I recommend she take Gabapentin 1026mat nightly first. Will titrate based on need. She can also take B complex if her Multivitamin does not have enough B12 or B6.   4. Obesity  -I encourage pt to watch her diet, exercise and  try to loose some weight -She did lose weight, but gained some of this back. I encouraged her to remain active and eat healthy diet.  5. Vaginal spotting and sever cramping -She has had irregular periods since her chemo for breast cancer ended in 2016.  -She did have uterine polyps removed in early 2020. After 4 months of no periods, she had 1 episode of vaginal spotting which started and ended with severe cramping. This has not recurred since.  -I advised she sees her Gyn for exam and possible Pelvic US evaluation. She is agreeable.  6. Cancer Screenings  -I encouraged her to continue yearly mammograms and regular Pap Smears -She can start screening colonoscopy this year.  -I also encouraged her to f/u with PCP.  7. Hypocalcemia  -Her 01/27/20 shows Ca at 8.8. -I recommend she start OTC calcium daily.    Plan -I called in Gabapentin 133m nightly.  -Proceed with Herceptin today  -Herceptin Hylecta injectionevery 3 weeks X6 -Port Flush every 6 weeks  -Lab every 12 weeks -f/u in 18 weeks.    No problem-specific Assessment & Plan notes found for this encounter.   No orders of the defined types were placed in this encounter.  All questions were answered. The patient knows to call the clinic with any problems, questions or concerns. No barriers to learning was detected. The total time spent in the appointment was {CHL ONC TIME VISIT - STKCXW:[id number]}     AJoslyn Devon1/10/2020   IOneal Deputy am acting as scribe for YTruitt Merle MD.   {Add scribe attestation statement}

## 2020-08-17 ENCOUNTER — Ambulatory Visit: Payer: BC Managed Care – PPO | Admitting: Hematology

## 2020-08-22 ENCOUNTER — Other Ambulatory Visit: Payer: Self-pay | Admitting: Hematology

## 2020-08-23 ENCOUNTER — Other Ambulatory Visit: Payer: Self-pay | Admitting: Hematology

## 2020-08-24 ENCOUNTER — Encounter: Payer: Self-pay | Admitting: Hematology

## 2020-08-24 ENCOUNTER — Other Ambulatory Visit: Payer: Self-pay

## 2020-08-24 ENCOUNTER — Inpatient Hospital Stay: Payer: BC Managed Care – PPO | Attending: Hematology

## 2020-08-24 ENCOUNTER — Inpatient Hospital Stay (HOSPITAL_BASED_OUTPATIENT_CLINIC_OR_DEPARTMENT_OTHER): Payer: BC Managed Care – PPO | Admitting: Hematology

## 2020-08-24 VITALS — BP 129/69 | HR 74 | Temp 98.5°F | Resp 17 | Wt 228.8 lb

## 2020-08-24 DIAGNOSIS — Z171 Estrogen receptor negative status [ER-]: Secondary | ICD-10-CM | POA: Insufficient documentation

## 2020-08-24 DIAGNOSIS — C50911 Malignant neoplasm of unspecified site of right female breast: Secondary | ICD-10-CM | POA: Diagnosis not present

## 2020-08-24 DIAGNOSIS — Z5112 Encounter for antineoplastic immunotherapy: Secondary | ICD-10-CM | POA: Insufficient documentation

## 2020-08-24 DIAGNOSIS — C7800 Secondary malignant neoplasm of unspecified lung: Secondary | ICD-10-CM | POA: Diagnosis not present

## 2020-08-24 DIAGNOSIS — C50311 Malignant neoplasm of lower-inner quadrant of right female breast: Secondary | ICD-10-CM | POA: Insufficient documentation

## 2020-08-24 DIAGNOSIS — I10 Essential (primary) hypertension: Secondary | ICD-10-CM | POA: Diagnosis not present

## 2020-08-24 DIAGNOSIS — G629 Polyneuropathy, unspecified: Secondary | ICD-10-CM | POA: Insufficient documentation

## 2020-08-24 MED ORDER — ACETAMINOPHEN 325 MG PO TABS: 650.0000 mg | ORAL_TABLET | Freq: Once | ORAL | Status: DC

## 2020-08-24 MED ORDER — TRASTUZUMAB-HYALURONIDASE-OYSK 600-10000 MG-UNT/5ML ~~LOC~~ SOLN
600.0000 mg | Freq: Once | SUBCUTANEOUS | Status: AC
  Administered 2020-08-24: 600 mg via SUBCUTANEOUS
  Filled 2020-08-24: qty 5

## 2020-08-24 MED ORDER — SODIUM CHLORIDE 0.9 % IV SOLN
Freq: Once | INTRAVENOUS | Status: DC
  Filled 2020-08-24: qty 250

## 2020-08-24 NOTE — Progress Notes (Signed)
[name] [name]   [phone] [phone]   Clinic Follow up Note   Patient Care Team: Virginia Rochester, [place] as PCP - General (Family Medicine) [name], DO (Family Medicine) Holley Bouche, NP (Inactive) as Nurse Practitioner (Nurse Practitioner) Stark Klein, MD as Consulting Physician (General Surgery) Arloa Koh, MD (Inactive) as Consulting Physician (Radiation Oncology) [name], MD as Consulting Physician (Hematology) Clydie Braun, MD (Obstetrics and Gynecology) Haroldine Laws, Shaune Pascal, MD as Consulting Physician (Cardiology)  Date of Service:  08/24/2020  CHIEF COMPLAINT: F/u right breast cancer  SUMMARY OF ONCOLOGIC HISTORY: Oncology History Overview Note  Breast cancer 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   Staging form: Breast, AJCC 7th Edition     Clinical stage from 07/22/2014: Stage IV (T3, N1, M1) - Unsigned     Breast cancer 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 ([place])  07/02/2014 Mammogram   Mammogram showed a 2cm right beast mass and a 1.8cm right axillary node. MRI breast on 07/16/2014 showed 7cm R breast lesion and 4.4cm r axillary node    07/02/2014 Imaging   CT CAP: a 4.7cm mass in LUL lung and a 2.1cm mas in RML, and a small nodule in RUL, suspecious for metastasis     07/09/2014 Initial Diagnosis   right IDA with b/l lung lesions, ER-/PR-/HER2+   07/09/2014 Initial Biopsy   US guided right breast mass and axillary node biopsy showed IDA, and DCIS, ER-/PR-/HER2+   07/26/2014 Pathologic Stage   Left lung mass by IR, path revealed high grade carcinoma, morphology similar to breast tumor biopsy, TTF(-), NapsinA(-), ER(-)   08/04/2014 - 03/22/2015 Chemotherapy   weekly Paclitaxel 50m/m2, trastuzumab and pertuzumab every 3 weeks   08/30/2014 Genetic Testing   BreastNext panel was negative. 17 genes including BRCA1, BRCA2, were negative for mutations.    10/04/2014 Imaging   Interval decrease in the right axillary lymphadenopathy. Bilateral  pulmonary lesions with left hilar lymphadenopathy also markedly decreased in the interval. The left hilar lymphadenopathy has resolved.   12/20/2014 Imaging   restaging CT showed stable disease, no new lesions    03/29/2015 Pathology Results    right breast lumpectomy showed  chemotherapy treatment effect,  a 1 mm residual tumor,   margins were widely negative, 5 sentinel lymph nodes and 2 axillary lymph nodes were negative.    03/29/2015 Surgery    right breast lumpectomy and sentinel lymph node biopsy  by Dr. BBarry Dienes  04/12/2015 -  Chemotherapy   Herceptin maintenance therapy , 6 mg/kg, every 3 weeks since 04/12/15. Switched to Herceptin Hylecta injection q3weeks after 06/10/19.   05/03/2015 - 06/14/2015 Radiation Therapy   right breast adjuvant irradiation by Dr. MValere Dross   08/28/2016 Imaging   CT CAP w Contrast  IMPRESSION: 1. No acute process or evidence of metastatic disease within the chest, abdomen, or pelvis. 2. Similar to less well-defined left upper lobe density, likely scarring. No evidence of new or progressive pulmonary metastasis. 3. Right nephrolithiasis.   01/22/2017 Imaging   CT Chest W Contrast 01/22/17 IMPRESSION: 1. Stable exam. No new or progressive findings. No evidence for metastatic disease. 2. Left upper lobe architectural distortion/scarring is stable. 3. Nonobstructing right renal stone.   07/31/2017 Imaging   Bone Scan Whole Body 07/31/17  IMPRESSION: 1. No scintigraphic evidence of osseous metastatic disease. 2. Thoracolumbar scoliosis.    07/31/2017 Imaging   CT CAP W Contrast 07/31/17 IMPRESSION: 1. No findings of active or recurrent malignancy. 2. Other imaging findings of potential clinical significance: Aortic  Atherosclerosis (ICD10-I70.0). Mild cardiomegaly. Postoperative and radiation therapy findings in the right chest. Nonobstructive right nephrolithiasis. Thoracolumbar scoliosis.    01/20/2018 Echocardiogram   ECHO 01/20/2018 Study  Conclusions  - Left ventricle: The cavity size was normal. Wall thickness was   normal. Systolic function was normal. The estimated ejection   fraction was in the range of 55% to 60%. Wall motion was normal;   there were no regional wall motion abnormalities. Doppler   parameters are consistent with abnormal left ventricular   relaxation (grade 1 diastolic dysfunction). - Impressions: GLS -20.7% LS&' 10.4 cm.    02/17/2018 Imaging   02/17/2018 CT CAP IMPRESSION: 1. No evidence for residual or recurrent tumor or metastatic disease. 2.  Aortic Atherosclerosis (ICD10-I70.0). 3. Right renal calculi. 4. Scoliosis.   08/11/2018 Imaging   CT CAP W Contrast 08/11/18  IMPRESSION: No evidence for localized recurrence or metastatic disease in the chest, abdomen or pelvis.   02/23/2019 Imaging   CT CAP W Contrast  IMPRESSION: 1. No findings of active malignancy in the chest, abdomen, or pelvis. 2. 3 mm right kidney lower pole nonobstructive renal calculus. 3. Thoracolumbar scoliosis.   12/08/2019 Imaging   CT CAP W contrast  IMPRESSION: 1. Unchanged post treatment appearance of the lungs. No evidence of recurrent or new metastatic disease in the chest, abdomen, or pelvis.   2. Postoperative findings of right lumpectomy and axillary lymph node dissection.   3.  Nonobstructive right nephrolithiasis.      CURRENT THERAPY:  MaintenanceHerceptin every 3 weeks since 04/12/15. Switched to Herceptin Hylecta injection q3weeks after 06/10/19.  INTERVAL HISTORY:  [name] [name] is here for a follow up. She presents to the clinic alone. She notes she is doing well. She notes right breast skin itching occasionally. She denies rash or skin change.     REVIEW OF SYSTEMS:   Constitutional: Denies fevers, chills or abnormal weight loss Eyes: Denies blurriness of vision Ears, nose, mouth, throat, and face: Denies mucositis or sore throat Respiratory: Denies cough, dyspnea or  wheezes Cardiovascular: Denies palpitation, chest discomfort or lower extremity swelling Gastrointestinal:  Denies nausea, heartburn or change in bowel habits Skin: Denies abnormal skin rashes (+) right breast skin itching Lymphatics: Denies new lymphadenopathy or easy bruising Neurological:Denies numbness, tingling or new weaknesses Behavioral/Psych: Mood is stable, no new changes  All other systems were reviewed with the patient and are negative.  MEDICAL HISTORY:  Past Medical History:  Diagnosis Date  . Breast cancer ([place])   . Breast cancer ([place])   . GERD (gastroesophageal reflux disease)    during pregnancy   . Pneumonia    hx of pneumonia 08/2013   . S/P radiation therapy 05/03/2015 through 06/14/2015    Right breast 4680 cGy in 26 sessions, right breast boost 1000 cGy in 5 sessions. Right supraclavicular/axillary region 4680 cGy with a supplemental PA field to bring the axillary dose up to 4500 cGy in 26 sessions    SURGICAL HISTORY: Past Surgical History:  Procedure Laterality Date  . BREAST LUMPECTOMY WITH RADIOACTIVE SEED AND SENTINEL LYMPH NODE BIOPSY Right 03/29/2015   Procedure: BREAST LUMPECTOMY WITH RADIOACTIVE SEED AND SENTINEL LYMPH NODE BIOPSY;  Surgeon: Stark Klein, MD;  Location: [facility];  Service: General;  Laterality: Right;  . CESAREAN SECTION    . CHOLECYSTECTOMY    . ESSURE TUBAL LIGATION    . PORTACATH PLACEMENT Left 07/21/2014   Procedure: INSERTION PORT-A-CATH;  Surgeon: Stark Klein, MD;  Location: [facility];  Service: General;  Laterality: Left;  . WISDOM TOOTH EXTRACTION      I have reviewed the social history and family history with the patient and they are unchanged from previous note.  ALLERGIES:  is allergic to adhesive [tape], aspirin, and codeine.  MEDICATIONS:  Current Outpatient Medications  Medication Sig Dispense Refill  . acetaminophen (TYLENOL) 500 MG tablet Take 500 mg  by mouth every 6 (six) hours as needed.    . Ascorbic Acid (VITAMIN C) 500 MG CHEW     . Calcium-Phosphorus-Vitamin D (CALCIUM GUMMIES PO) Take by mouth daily.    . Camphor-Eucalyptus-Menthol (VICKS VAPORUB EX) Apply 1 application topically at bedtime. Applies under nose, throat and on chest.    . cetirizine (ZYRTEC) 10 MG tablet Take 10 mg by mouth daily as needed for allergies (allergies).     . clobetasol (TEMOVATE) 0.05 % external solution Apply 1 application topically 2 (two) times daily.    . fluticasone (FLONASE) 50 MCG/ACT nasal spray Place into both nostrils daily.    Marland Kitchen gabapentin (NEURONTIN) 100 MG capsule Take 2 capsules (200 mg total) by mouth at bedtime. 60 capsule 3  . hydrocortisone 2.5 % cream Apply 1 application topically 2 (two) times daily as needed.    Marland Kitchen ibuprofen (ADVIL,MOTRIN) 800 MG tablet Take 1 tablet (800 mg total) by mouth every 8 (eight) hours as needed. 60 tablet 1  . lidocaine-prilocaine (EMLA) cream APPLY TOPICALLY TO THE AFFECTED AREA AS NEEDED 30 g 2  . loperamide (IMODIUM A-D) 2 MG tablet Take 1 tablet (2 mg total) by mouth 4 (four) times daily as needed for diarrhea or loose stools. 30 tablet 0  . losartan (COZAAR) 25 MG tablet Take 1 tablet (25 mg total) by mouth daily. 30 tablet 6  . misoprostol (CYTOTEC) 200 MCG tablet Take 400 mcg by mouth 2 (two) times daily.    . Multiple Vitamins-Minerals (MULTIVITAMIN GUMMIES ADULT PO) Take 1 each by mouth daily. Women's Vitafusion gummie    . spironolactone (ALDACTONE) 25 MG tablet TAKE 1 TABLET BY MOUTH DAILY 90 tablet 3  . triamcinolone (KENALOG) 0.025 % ointment Apply 1 application topically as needed.     No current facility-administered medications for this visit.   Facility-Administered Medications Ordered in Other Visits  Medication Dose Route Frequency Provider Last Rate Last Admin  . 0.9 %  sodium chloride infusion   Intravenous Once [name], MD      . acetaminophen (TYLENOL) tablet 650 mg  650 mg Oral  Once [name], MD      . acetaminophen (TYLENOL) tablet 650 mg  650 mg Oral Once [name], MD      . heparin lock flush 100 unit/mL  500 Units Intracatheter Once PRN [name], MD      . sodium chloride 0.9 % injection 10 mL  10 mL Intracatheter PRN [name], MD   10 mL at 05/01/16 1622  . sodium chloride 0.9 % injection 10 mL  10 mL Intravenous PRN [name], MD      . sodium chloride 0.9 % injection 10 mL  10 mL Intracatheter PRN [name], MD   10 mL at 03/18/19 1715  . sodium chloride flush (NS) 0.9 % injection 10 mL  10 mL Intravenous PRN [name], MD        PHYSICAL EXAMINATION: ECOG PERFORMANCE STATUS: 0 - Asymptomatic  Vitals with BMI 08/24/2020  Height 5'10"  Weight 228 lbs 12 oz  BMI   Systolic 818  Diastolic 69  Pulse 74    Due to COVID19 we will limit examination to appearance. Patient had no complaints.  GENERAL:alert, no distress and comfortable SKIN: skin color normal, no rashes or significant lesions EYES: normal, Conjunctiva are pink and non-injected, sclera clear  NEURO: alert & oriented x 3 with fluent speech   LABORATORY DATA:  I have reviewed the data as listed CBC Latest Ref Rng & Units 06/22/2020 03/30/2020 01/27/2020  WBC 4.0 - 10.5 K/uL 7.3 7.3 7.7  Hemoglobin 12.0 - 15.0 g/dL 11.9(L) 12.0 11.8(L)  Hematocrit 36.0 - 46.0 % 36.0 36.3 36.2  Platelets 150 - 400 K/uL 231 215 229     CMP Latest Ref Rng & Units 06/22/2020 03/30/2020 01/27/2020  Glucose 70 - 99 mg/dL 113(H) 110(H) 120(H)  BUN 6 - 20 mg/dL 13 15 16   Creatinine 0.44 - 1.00 mg/dL 0.90 0.96 0.99  Sodium 135 - 145 mmol/L 141 140 139  Potassium 3.5 - 5.1 mmol/L 3.5 3.8 3.8  Chloride 98 - 111 mmol/L 106 105 104  CO2 22 - 32 mmol/L 27 28 26   Calcium 8.9 - 10.3 mg/dL 8.9 9.8 8.8(L)  Total Protein 6.5 - 8.1 g/dL 7.9 7.8 7.6  Total Bilirubin 0.3 - 1.2 mg/dL 0.6 0.4 0.3  Alkaline Phos 38 - 126 U/L 65 72 71  AST 15 - 41 U/L 17 19 12(L)  ALT 0 - 44 U/L 21 23 12       RADIOGRAPHIC STUDIES: I  have personally reviewed the radiological images as listed and agreed with the findings in the report. No results found.   ASSESSMENT & PLAN:  KARELI HOSSAIN is a 50 y.o. 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 Tolerating well. -She underwent hysteroscopy with D&C on 11/27/18 to remove benign polyps that were recently found. -Her CT CAP from 12/08/19 showedstable post treatment appearance of the lungs. No evidence of recurrence or new metastatic disease.  -She has been on Herceptin for almost 5 years. I discussed after 5 years we will repeat scan once a year and ECHOs every 6 months. I discussed her case is unusual given herprolonged disease free status although she is still at risk for recurrence. I discussed that there is no data to support stopping Herceptin given the initial metastatic disease. However I may consider stopping her Herceptin if she remains NED after 10 years. She is agreeable. -She is clinically doing well and stable. She will proceed with Herceptin today and continue every 3 weeks.  -Since she is doing very well, will space out her labs to every 12 weeks, continue port flush every 6 weeks,follow-up in 3 months with next CT Scan.   2. HTN -On losartangivingby Dr. BHaroldine Lawsin June 2019, dose increasedin 08/2018. -07/22/18 ECHO showed EF at 60-65%, GLS 22%  3. Intermittent Neuropathy  -Residual effects from her prior chemo treatment -She notes her intermittent neuropathy is happening more frequently, mostly at night or relaxed with tingling in hands and LE but mainly in left Lower leg.  -Due to tingling I recommend she take Gabapentin 1065mat nightly first. Will titrate based on need. She can  also take B complex if her Multivitamin does not have enough B12 or B6.   4. Obesity  -I encourage pt to watch her diet, exercise and try  to loose some weight -She did lose weight, but gained some of this back. I encouraged her to remain active and eat healthy diet.  5. Cancer Screenings  -I encouraged her to continue yearly mammograms and regular Pap Smears -I encouraged her to start screening colonoscopy this year.  -I also encouraged her to f/u with PCP.   Plan -Proceed with Herceptin injection today  -Herceptin Hylecta injectionevery 3 weeks X6 -Port Flush every 6 weeks  -f/u in 12 weeks with lab and CT CAP W contrast a few days before    No problem-specific Assessment & Plan notes found for this encounter.   Orders Placed This Encounter  Procedures  . CT CHEST ABDOMEN PELVIS W CONTRAST    Standing Status:   Future    Standing Expiration Date:   08/24/2021    Order Specific Question:   If indicated for the ordered procedure, I authorize the administration of contrast media per Radiology protocol    Answer:   Yes    Order Specific Question:   Is patient pregnant?    Answer:   No    Order Specific Question:   Preferred imaging location?    Answer:   [facility]    Order Specific Question:   Release to patient    Answer:   Immediate    Order Specific Question:   Is Oral Contrast requested for this exam?    Answer:   Yes, Per Radiology protocol    Order Specific Question:   Reason for Exam (SYMPTOM  OR DIAGNOSIS REQUIRED)    Answer:   pt has been NED, still on treatment, restaging CT   All questions were answered. The patient knows to call the clinic with any problems, questions or concerns. No barriers to learning was detected.      [name], MD 08/24/2020   I, Joslyn Devon, am acting as scribe for [name], MD.   I have reviewed the above documentation for accuracy and completeness, and I agree with the above.

## 2020-08-24 NOTE — Patient Instructions (Signed)
[facility] Discharge Instructions for Patients Receiving Chemotherapy  Today you received the following chemotherapy agents trastuzumab.  To help prevent nausea and vomiting after your treatment, we encourage you to take your nausea medication as directed.    If you develop nausea and vomiting that is not controlled by your nausea medication, call the clinic.   BELOW ARE SYMPTOMS THAT SHOULD BE REPORTED IMMEDIATELY:  *FEVER GREATER THAN 100.5 F  *CHILLS WITH OR WITHOUT FEVER  NAUSEA AND VOMITING THAT IS NOT CONTROLLED WITH YOUR NAUSEA MEDICATION  *UNUSUAL SHORTNESS OF BREATH  *UNUSUAL BRUISING OR BLEEDING  TENDERNESS IN MOUTH AND THROAT WITH OR WITHOUT PRESENCE OF ULCERS  *URINARY PROBLEMS  *BOWEL PROBLEMS  UNUSUAL RASH Items with * indicate a potential emergency and should be followed up as soon as possible.  Feel free to call the clinic should you have any questions or concerns. The clinic phone number is [phone].  Please show the CHEMO ALERT CARD at check-in to the Emergency Department and triage nurse.   

## 2020-08-25 ENCOUNTER — Telehealth: Payer: Self-pay | Admitting: Hematology

## 2020-08-25 NOTE — Telephone Encounter (Signed)
Scheduled appts per 1/12 los. Left voicemail with next appt date and time. Pt to refer to mychart for other appts. Advised pt to call office with any questions or if she needs any appts rescheduled.

## 2020-09-14 ENCOUNTER — Other Ambulatory Visit: Payer: Self-pay

## 2020-09-14 ENCOUNTER — Inpatient Hospital Stay: Payer: BC Managed Care – PPO | Attending: Hematology

## 2020-09-14 VITALS — BP 128/72 | HR 72 | Temp 98.2°F | Resp 18

## 2020-09-14 DIAGNOSIS — C50911 Malignant neoplasm of unspecified site of right female breast: Secondary | ICD-10-CM

## 2020-09-14 DIAGNOSIS — C50311 Malignant neoplasm of lower-inner quadrant of right female breast: Secondary | ICD-10-CM | POA: Insufficient documentation

## 2020-09-14 DIAGNOSIS — Z5112 Encounter for antineoplastic immunotherapy: Secondary | ICD-10-CM | POA: Insufficient documentation

## 2020-09-14 DIAGNOSIS — C7800 Secondary malignant neoplasm of unspecified lung: Secondary | ICD-10-CM

## 2020-09-14 MED ORDER — ACETAMINOPHEN 325 MG PO TABS: 650.0000 mg | ORAL_TABLET | Freq: Once | ORAL | Status: DC

## 2020-09-14 MED ORDER — TRASTUZUMAB-HYALURONIDASE-OYSK 600-10000 MG-UNT/5ML ~~LOC~~ SOLN
600.0000 mg | Freq: Once | SUBCUTANEOUS | Status: AC
  Administered 2020-09-14: 600 mg via SUBCUTANEOUS
  Filled 2020-09-14: qty 5

## 2020-09-14 NOTE — Patient Instructions (Signed)
[facility] Discharge Instructions for Patients Receiving Chemotherapy  Today you received the following chemotherapy agents trastuzumab.  To help prevent nausea and vomiting after your treatment, we encourage you to take your nausea medication as directed.    If you develop nausea and vomiting that is not controlled by your nausea medication, call the clinic.   BELOW ARE SYMPTOMS THAT SHOULD BE REPORTED IMMEDIATELY:  *FEVER GREATER THAN 100.5 F  *CHILLS WITH OR WITHOUT FEVER  NAUSEA AND VOMITING THAT IS NOT CONTROLLED WITH YOUR NAUSEA MEDICATION  *UNUSUAL SHORTNESS OF BREATH  *UNUSUAL BRUISING OR BLEEDING  TENDERNESS IN MOUTH AND THROAT WITH OR WITHOUT PRESENCE OF ULCERS  *URINARY PROBLEMS  *BOWEL PROBLEMS  UNUSUAL RASH Items with * indicate a potential emergency and should be followed up as soon as possible.  Feel free to call the clinic should you have any questions or concerns. The clinic phone number is [phone].  Please show the CHEMO ALERT CARD at check-in to the Emergency Department and triage nurse.   

## 2020-09-14 NOTE — Progress Notes (Signed)
Pt discharged in no apparent distress. Pt left ambulatory without assistance. Pt aware of discharge instructions and verbalized understanding and had no further questions.  

## 2020-10-05 ENCOUNTER — Inpatient Hospital Stay: Payer: BC Managed Care – PPO

## 2020-10-05 ENCOUNTER — Other Ambulatory Visit: Payer: Self-pay

## 2020-10-05 VITALS — BP 141/75 | HR 74 | Temp 98.8°F | Resp 18

## 2020-10-05 DIAGNOSIS — Z5112 Encounter for antineoplastic immunotherapy: Secondary | ICD-10-CM | POA: Diagnosis not present

## 2020-10-05 DIAGNOSIS — Z95828 Presence of other vascular implants and grafts: Secondary | ICD-10-CM

## 2020-10-05 DIAGNOSIS — C50911 Malignant neoplasm of unspecified site of right female breast: Secondary | ICD-10-CM

## 2020-10-05 MED ORDER — ACETAMINOPHEN 325 MG PO TABS
ORAL_TABLET | ORAL | Status: AC
  Filled 2020-10-05: qty 2

## 2020-10-05 MED ORDER — HEPARIN SOD (PORK) LOCK FLUSH 100 UNIT/ML IV SOLN
500.0000 [IU] | Freq: Once | INTRAVENOUS | Status: AC | PRN
  Administered 2020-10-05: 500 [IU] via INTRAVENOUS
  Filled 2020-10-05: qty 5

## 2020-10-05 MED ORDER — TRASTUZUMAB-HYALURONIDASE-OYSK 600-10000 MG-UNT/5ML ~~LOC~~ SOLN
600.0000 mg | Freq: Once | SUBCUTANEOUS | Status: AC
  Administered 2020-10-05: 600 mg via SUBCUTANEOUS
  Filled 2020-10-05: qty 5

## 2020-10-05 MED ORDER — ACETAMINOPHEN 325 MG PO TABS: 650.0000 mg | ORAL_TABLET | Freq: Once | ORAL | Status: DC

## 2020-10-05 MED ORDER — SODIUM CHLORIDE 0.9% FLUSH
10.0000 mL | Freq: Once | INTRAVENOUS | Status: AC
Start: 2020-10-05 — End: 2020-10-05
  Administered 2020-10-05: 10 mL
  Filled 2020-10-05: qty 10

## 2020-10-05 NOTE — Patient Instructions (Signed)

## 2020-10-05 NOTE — Patient Instructions (Signed)
[facility] Discharge Instructions for Patients Receiving Chemotherapy  Today you received the following chemotherapy agents herceptin hylecta   To help prevent nausea and vomiting after your treatment, we encourage you to take your nausea medication as directed.    If you develop nausea and vomiting that is not controlled by your nausea medication, call the clinic.   BELOW ARE SYMPTOMS THAT SHOULD BE REPORTED IMMEDIATELY:  *FEVER GREATER THAN 100.5 F  *CHILLS WITH OR WITHOUT FEVER  NAUSEA AND VOMITING THAT IS NOT CONTROLLED WITH YOUR NAUSEA MEDICATION  *UNUSUAL SHORTNESS OF BREATH  *UNUSUAL BRUISING OR BLEEDING  TENDERNESS IN MOUTH AND THROAT WITH OR WITHOUT PRESENCE OF ULCERS  *URINARY PROBLEMS  *BOWEL PROBLEMS  UNUSUAL RASH Items with * indicate a potential emergency and should be followed up as soon as possible.  Feel free to call the clinic should you have any questions or concerns. The clinic phone number is [phone].  Please show the [place] at check-in to the Emergency Department and triage nurse.

## 2020-10-26 ENCOUNTER — Inpatient Hospital Stay: Payer: BC Managed Care – PPO | Attending: Hematology

## 2020-10-26 ENCOUNTER — Other Ambulatory Visit: Payer: Self-pay

## 2020-10-26 VITALS — BP 148/62 | HR 73 | Temp 98.7°F | Resp 16

## 2020-10-26 DIAGNOSIS — Z5112 Encounter for antineoplastic immunotherapy: Secondary | ICD-10-CM | POA: Diagnosis present

## 2020-10-26 DIAGNOSIS — C50311 Malignant neoplasm of lower-inner quadrant of right female breast: Secondary | ICD-10-CM | POA: Insufficient documentation

## 2020-10-26 DIAGNOSIS — C50911 Malignant neoplasm of unspecified site of right female breast: Secondary | ICD-10-CM

## 2020-10-26 MED ORDER — TRASTUZUMAB-HYALURONIDASE-OYSK 600-10000 MG-UNT/5ML ~~LOC~~ SOLN
600.0000 mg | Freq: Once | SUBCUTANEOUS | Status: AC
  Administered 2020-10-26: 600 mg via SUBCUTANEOUS
  Filled 2020-10-26: qty 5

## 2020-10-26 NOTE — Patient Instructions (Signed)
[facility] Discharge Instructions for Patients Receiving Chemotherapy  Today you received the following chemotherapy agents: trastuzumab hyaluronidase.   To help prevent nausea and vomiting after your treatment, we encourage you to take your nausea medication as directed.   If you develop nausea and vomiting that is not controlled by your nausea medication, call the clinic.   BELOW ARE SYMPTOMS THAT SHOULD BE REPORTED IMMEDIATELY:  *FEVER GREATER THAN 100.5 F  *CHILLS WITH OR WITHOUT FEVER  NAUSEA AND VOMITING THAT IS NOT CONTROLLED WITH YOUR NAUSEA MEDICATION  *UNUSUAL SHORTNESS OF BREATH  *UNUSUAL BRUISING OR BLEEDING  TENDERNESS IN MOUTH AND THROAT WITH OR WITHOUT PRESENCE OF ULCERS  *URINARY PROBLEMS  *BOWEL PROBLEMS  UNUSUAL RASH Items with * indicate a potential emergency and should be followed up as soon as possible.  Feel free to call the clinic should you have any questions or concerns. The clinic phone number is [phone].  Please show the [place] at check-in to the Emergency Department and triage nurse.

## 2020-11-10 ENCOUNTER — Other Ambulatory Visit (HOSPITAL_COMMUNITY): Payer: Self-pay | Admitting: Internal Medicine

## 2020-11-16 ENCOUNTER — Inpatient Hospital Stay: Payer: BC Managed Care – PPO | Attending: Hematology

## 2020-11-16 ENCOUNTER — Inpatient Hospital Stay: Payer: BC Managed Care – PPO

## 2020-11-16 ENCOUNTER — Other Ambulatory Visit: Payer: Self-pay

## 2020-11-16 VITALS — BP 146/70 | HR 74 | Temp 98.9°F | Resp 16

## 2020-11-16 DIAGNOSIS — I509 Heart failure, unspecified: Secondary | ICD-10-CM | POA: Insufficient documentation

## 2020-11-16 DIAGNOSIS — C7800 Secondary malignant neoplasm of unspecified lung: Secondary | ICD-10-CM | POA: Diagnosis not present

## 2020-11-16 DIAGNOSIS — Z171 Estrogen receptor negative status [ER-]: Secondary | ICD-10-CM | POA: Diagnosis not present

## 2020-11-16 DIAGNOSIS — G629 Polyneuropathy, unspecified: Secondary | ICD-10-CM | POA: Insufficient documentation

## 2020-11-16 DIAGNOSIS — C50911 Malignant neoplasm of unspecified site of right female breast: Secondary | ICD-10-CM

## 2020-11-16 DIAGNOSIS — C50311 Malignant neoplasm of lower-inner quadrant of right female breast: Secondary | ICD-10-CM | POA: Insufficient documentation

## 2020-11-16 DIAGNOSIS — Z5112 Encounter for antineoplastic immunotherapy: Secondary | ICD-10-CM | POA: Insufficient documentation

## 2020-11-16 DIAGNOSIS — I110 Hypertensive heart disease with heart failure: Secondary | ICD-10-CM | POA: Diagnosis not present

## 2020-11-16 DIAGNOSIS — Z95828 Presence of other vascular implants and grafts: Secondary | ICD-10-CM

## 2020-11-16 LAB — CBC WITH DIFFERENTIAL/PLATELET
Abs Immature Granulocytes: 0.01 K/uL (ref 0.00–0.07)
Basophils Absolute: 0 K/uL (ref 0.0–0.1)
Basophils Relative: 1 %
Eosinophils Absolute: 0.1 K/uL (ref 0.0–0.5)
Eosinophils Relative: 2 %
HCT: 36.6 % (ref 36.0–46.0)
Hemoglobin: 12 g/dL (ref 12.0–15.0)
Immature Granulocytes: 0 %
Lymphocytes Relative: 36 %
Lymphs Abs: 2.3 K/uL (ref 0.7–4.0)
MCH: 29.4 pg (ref 26.0–34.0)
MCHC: 32.8 g/dL (ref 30.0–36.0)
MCV: 89.7 fL (ref 80.0–100.0)
Monocytes Absolute: 0.6 K/uL (ref 0.1–1.0)
Monocytes Relative: 9 %
Neutro Abs: 3.4 K/uL (ref 1.7–7.7)
Neutrophils Relative %: 52 %
Platelets: 245 K/uL (ref 150–400)
RBC: 4.08 MIL/uL (ref 3.87–5.11)
RDW: 13.2 % (ref 11.5–15.5)
WBC: 6.5 K/uL (ref 4.0–10.5)
nRBC: 0 % (ref 0.0–0.2)

## 2020-11-16 LAB — COMPREHENSIVE METABOLIC PANEL
ALT: 20 U/L (ref 0–44)
AST: 17 U/L (ref 15–41)
Albumin: 3.8 g/dL (ref 3.5–5.0)
Alkaline Phosphatase: 74 U/L (ref 38–126)
Anion gap: 9 (ref 5–15)
BUN: 14 mg/dL (ref 6–20)
CO2: 27 mmol/L (ref 22–32)
Calcium: 9 mg/dL (ref 8.9–10.3)
Chloride: 105 mmol/L (ref 98–111)
Creatinine, Ser: 0.89 mg/dL (ref 0.44–1.00)
GFR, Estimated: >60 mL/min (ref >60)
Glucose, Bld: 81 mg/dL (ref 70–99)
Potassium: 3.9 mmol/L (ref 3.5–5.1)
Sodium: 141 mmol/L (ref 135–145)
Total Bilirubin: 0.3 mg/dL (ref 0.3–1.2)
Total Protein: 7.9 g/dL (ref 6.5–8.1)

## 2020-11-16 MED ORDER — TRASTUZUMAB-HYALURONIDASE-OYSK 600-10000 MG-UNT/5ML ~~LOC~~ SOLN
600.0000 mg | Freq: Once | SUBCUTANEOUS | Status: AC
  Administered 2020-11-16: 600 mg via SUBCUTANEOUS
  Filled 2020-11-16: qty 5

## 2020-11-16 MED ORDER — SODIUM CHLORIDE 0.9% FLUSH
10.0000 mL | Freq: Once | INTRAVENOUS | Status: AC
  Administered 2020-11-16: 10 mL
  Filled 2020-11-16: qty 10

## 2020-11-16 MED ORDER — HEPARIN SOD (PORK) LOCK FLUSH 100 UNIT/ML IV SOLN
500.0000 [IU] | Freq: Once | INTRAVENOUS | Status: AC | PRN
Start: 2020-11-16 — End: 2020-11-16
  Administered 2020-11-16: 500 [IU] via INTRAVENOUS
  Filled 2020-11-16: qty 5

## 2020-11-16 NOTE — Progress Notes (Signed)
Per Dr Burr Medico ok to treat today with echo from 04/11/2020.

## 2020-11-16 NOTE — Patient Instructions (Signed)

## 2020-11-17 ENCOUNTER — Ambulatory Visit (HOSPITAL_COMMUNITY)
Admission: RE | Admit: 2020-11-17 | Discharge: 2020-11-17 | Disposition: A | Discharge status: Home / Self Care | Payer: BC Managed Care – PPO | Source: Ambulatory Visit | Attending: Hematology | Admitting: Hematology

## 2020-11-17 DIAGNOSIS — C7800 Secondary malignant neoplasm of unspecified lung: Secondary | ICD-10-CM | POA: Diagnosis present

## 2020-11-17 DIAGNOSIS — C50911 Malignant neoplasm of unspecified site of right female breast: Secondary | ICD-10-CM | POA: Insufficient documentation

## 2020-11-17 MED ORDER — IOHEXOL 300 MG/ML  SOLN
100.0000 mL | Freq: Once | INTRAMUSCULAR | Status: AC | PRN
  Administered 2020-11-17: 100 mL via INTRAVENOUS

## 2020-11-19 ENCOUNTER — Other Ambulatory Visit: Payer: Self-pay | Admitting: Hematology

## 2020-11-19 DIAGNOSIS — C50511 Malignant neoplasm of lower-outer quadrant of right female breast: Secondary | ICD-10-CM

## 2020-11-21 ENCOUNTER — Inpatient Hospital Stay: Payer: BC Managed Care – PPO

## 2020-11-23 ENCOUNTER — Other Ambulatory Visit: Payer: Self-pay

## 2020-11-23 ENCOUNTER — Encounter (HOSPITAL_COMMUNITY): Payer: Self-pay | Admitting: Internal Medicine

## 2020-11-23 ENCOUNTER — Telehealth: Payer: Self-pay | Admitting: Hematology

## 2020-11-23 ENCOUNTER — Ambulatory Visit (HOSPITAL_COMMUNITY)
Admission: RE | Admit: 2020-11-23 | Discharge: 2020-11-23 | Disposition: A | Discharge status: Home / Self Care | Payer: BC Managed Care – PPO | Source: Ambulatory Visit | Attending: Internal Medicine | Admitting: Internal Medicine

## 2020-11-23 ENCOUNTER — Ambulatory Visit (HOSPITAL_BASED_OUTPATIENT_CLINIC_OR_DEPARTMENT_OTHER)
Admission: RE | Admit: 2020-11-23 | Discharge: 2020-11-23 | Disposition: A | Discharge status: Home / Self Care | Payer: BC Managed Care – PPO | Source: Ambulatory Visit | Attending: Internal Medicine | Admitting: Internal Medicine

## 2020-11-23 VITALS — BP 122/70 | HR 65 | Wt 232.0 lb

## 2020-11-23 DIAGNOSIS — Z923 Personal history of irradiation: Secondary | ICD-10-CM | POA: Diagnosis not present

## 2020-11-23 DIAGNOSIS — Z79899 Other long term (current) drug therapy: Secondary | ICD-10-CM | POA: Diagnosis not present

## 2020-11-23 DIAGNOSIS — I517 Cardiomegaly: Secondary | ICD-10-CM | POA: Diagnosis not present

## 2020-11-23 DIAGNOSIS — Z0189 Encounter for other specified special examinations: Secondary | ICD-10-CM | POA: Diagnosis not present

## 2020-11-23 DIAGNOSIS — Z803 Family history of malignant neoplasm of breast: Secondary | ICD-10-CM | POA: Insufficient documentation

## 2020-11-23 DIAGNOSIS — C7800 Secondary malignant neoplasm of unspecified lung: Secondary | ICD-10-CM

## 2020-11-23 DIAGNOSIS — Z888 Allergy status to other drugs, medicaments and biological substances status: Secondary | ICD-10-CM | POA: Insufficient documentation

## 2020-11-23 DIAGNOSIS — C50911 Malignant neoplasm of unspecified site of right female breast: Secondary | ICD-10-CM | POA: Insufficient documentation

## 2020-11-23 DIAGNOSIS — Z886 Allergy status to analgesic agent status: Secondary | ICD-10-CM | POA: Insufficient documentation

## 2020-11-23 DIAGNOSIS — C50919 Malignant neoplasm of unspecified site of unspecified female breast: Secondary | ICD-10-CM | POA: Diagnosis not present

## 2020-11-23 DIAGNOSIS — Z5181 Encounter for therapeutic drug level monitoring: Secondary | ICD-10-CM | POA: Diagnosis present

## 2020-11-23 DIAGNOSIS — I10 Essential (primary) hypertension: Secondary | ICD-10-CM | POA: Insufficient documentation

## 2020-11-23 DIAGNOSIS — Z171 Estrogen receptor negative status [ER-]: Secondary | ICD-10-CM | POA: Insufficient documentation

## 2020-11-23 DIAGNOSIS — Z885 Allergy status to narcotic agent status: Secondary | ICD-10-CM | POA: Diagnosis not present

## 2020-11-23 DIAGNOSIS — K219 Gastro-esophageal reflux disease without esophagitis: Secondary | ICD-10-CM | POA: Insufficient documentation

## 2020-11-23 HISTORY — DX: Heart failure, unspecified: I50.9

## 2020-11-23 LAB — ECHOCARDIOGRAM COMPLETE
Area-P 1/2: 3.11 cm2
S' Lateral: 3.2 cm

## 2020-11-23 NOTE — Progress Notes (Signed)
Cardio-Oncology Clinic Note   Date:  11/23/2020   ID:  [name] [name], DOB Jan 07, 1971, MRN [id number]  Location: Home  Provider location: [facility] Type of Visit: Established patient  PCP:  [name], PA  Cardiologist:  No primary care provider on file. Primary HF: Marisel Tostenson  Chief Complaint: Cardio-oncology follow-up   History of Present Illness:  [name] [name] is a 50 y/o woman with Stage IV breast CA with lung mets referred by Dr. Mosetta Putt for enrollment into the cardio-oncology clinic for surveillance while receiving Herceptin (started 04/12/15 and on indefinitiely) who presents via audio/video conferencing for a telehealth visit today.     Continues to receive herceptin for metastatic breast CA (has been receiving Herceptin since 8/16. Last CT scan on 12/08/19 showed lungs remain clear). Works for [employer] for [employer] with [employer]. Doing well. No SOB, orthopnea or PND. No edema. BP continues to run a bit high.   Echo 04/11/20 EF 60-65% GLS -22.5% Personally reviewed   Studies:  Echo 09/21/19 EF 60-65% GLS -21.9% Echo 03/02/19  EF 55-60% GLS -18.2%  Echo 12/19 60-65% GLS -22.2%  Echo 01/20/18. EF 55-60% LS' 10.4 cm/s GLS -20.7%  Echo 06/20/17 EF 55-60% LS' not measured will  GLS -19.1   Past Medical History:  Diagnosis Date  . Breast cancer (HCC)   . Breast cancer (HCC)   . CHF (congestive heart failure) (HCC)   . GERD (gastroesophageal reflux disease)    during pregnancy   . Pneumonia    hx of pneumonia 08/2013   . S/P radiation therapy 05/03/2015 through 06/14/2015    Right breast 4680 cGy in 26 sessions, right breast boost 1000 cGy in 5 sessions. Right supraclavicular/axillary region 4680 cGy with a supplemental PA field to bring the axillary dose up to 4500 cGy in 26 sessions   Past Surgical History:  Procedure Laterality Date  . BREAST LUMPECTOMY WITH RADIOACTIVE SEED AND SENTINEL  LYMPH NODE BIOPSY Right 03/29/2015   Procedure: BREAST LUMPECTOMY WITH RADIOACTIVE SEED AND SENTINEL LYMPH NODE BIOPSY;  Surgeon: Almond Lint, MD;  Location: [facility];  Service: General;  Laterality: Right;  . CESAREAN SECTION    . CHOLECYSTECTOMY    . ESSURE TUBAL LIGATION    . PORTACATH PLACEMENT Left 07/21/2014   Procedure: INSERTION PORT-A-CATH;  Surgeon: Almond Lint, MD;  Location: [facility];  Service: General;  Laterality: Left;  . WISDOM TOOTH EXTRACTION       Current Outpatient Medications  Medication Sig Dispense Refill  . acetaminophen (TYLENOL) 500 MG tablet Take 500 mg by mouth every 6 (six) hours as needed.    . Ascorbic Acid (VITAMIN C) 500 MG CHEW     . Calcium-Phosphorus-Vitamin D (CALCIUM GUMMIES PO) Take by mouth daily.    . Camphor-Eucalyptus-Menthol (VICKS VAPORUB EX) Apply 1 application topically at bedtime. Applies under nose, throat and on chest.    . cetirizine (ZYRTEC) 10 MG tablet Take 10 mg by mouth daily as needed for allergies (allergies).     . Fluocinolone Acetonide Body 0.01 % OIL Apply topically 4 (four) times a week.    . fluticasone (FLONASE) 50 MCG/ACT nasal spray Place into both nostrils daily.    Marland Kitchen gabapentin (NEURONTIN) 100 MG capsule Take 2 capsules (200 mg total) by mouth at bedtime. 60 capsule 3  . hydrocortisone 2.5 % cream Apply 1 application topically 2 (two) times daily as needed.    Marland Kitchen ibuprofen (ADVIL,MOTRIN) 800 MG tablet  Take 1 tablet (800 mg total) by mouth every 8 (eight) hours as needed. 60 tablet 1  . lidocaine-prilocaine (EMLA) cream APPLY TOPICALLY TO THE AFFECTED AREA AS NEEDED 30 g 2  . loperamide (IMODIUM A-D) 2 MG tablet Take 1 tablet (2 mg total) by mouth 4 (four) times daily as needed for diarrhea or loose stools. 30 tablet 0  . losartan (COZAAR) 25 MG tablet TAKE 1 TABLET(25 MG) BY MOUTH DAILY 30 tablet 3  . minoxidil (LONITEN) 2.5 MG tablet Take 2.5 mg by mouth daily. 1/2 tab once daily    . Multiple  Vitamins-Minerals (MULTIVITAMIN GUMMIES ADULT PO) Take 1 each by mouth daily. Women's Vitafusion gummie    . spironolactone (ALDACTONE) 25 MG tablet TAKE 1 TABLET BY MOUTH DAILY 90 tablet 3   No current facility-administered medications for this encounter.   Facility-Administered Medications Ordered in Other Encounters  Medication Dose Route Frequency Provider Last Rate Last Admin  . acetaminophen (TYLENOL) tablet 650 mg  650 mg Oral Once Malachy Mood, MD      . heparin lock flush 100 unit/mL  500 Units Intracatheter Once PRN Malachy Mood, MD      . sodium chloride 0.9 % injection 10 mL  10 mL Intracatheter PRN Malachy Mood, MD   10 mL at 05/01/16 1622  . sodium chloride 0.9 % injection 10 mL  10 mL Intravenous PRN Malachy Mood, MD      . sodium chloride 0.9 % injection 10 mL  10 mL Intracatheter PRN Malachy Mood, MD   10 mL at 03/18/19 1715  . sodium chloride flush (NS) 0.9 % injection 10 mL  10 mL Intravenous PRN Malachy Mood, MD        Allergies:   Adhesive [tape], Aspirin, and Codeine   Social History:  The patient  reports that she has never smoked. She has never used smokeless tobacco. She reports current alcohol use. She reports that she does not use drugs.   Family History:  The patient's family history includes Breast cancer (age of onset: 67) in her maternal aunt; Breast cancer (age of onset: 68) in her mother; Breast cancer (age of onset: 37) in her cousin; Breast cancer (age of onset: 57) in her maternal aunt; Liver cancer in her father and paternal grandmother; Prostate cancer in her paternal grandfather; Prostate cancer (age of onset: 69) in her maternal uncle; Prostate cancer (age of onset: 34) in her maternal uncle; Prostate cancer (age of onset: 54) in her maternal grandfather.   ROS:  Please see the history of present illness.   All other systems are personally reviewed and negative.    Vitals:   11/23/20 1022  BP: 122/70  Pulse: 65  SpO2: 100%  Weight: 105.2 kg (232 lb)    Exam:   General:  Well appearing. No resp difficulty HEENT: normal Neck: supple. no JVD. Carotids 2+ bilat; no bruits. No lymphadenopathy or thryomegaly appreciated. Cor: PMI nondisplaced. Regular rate & rhythm. No rubs, gallops or murmurs. Lungs: clear Abdomen: soft, nontender, nondistended. No hepatosplenomegaly. No bruits or masses. Good bowel sounds. Extremities: no cyanosis, clubbing, rash, edema Neuro: alert & orientedx3, cranial nerves grossly intact. moves all 4 extremities w/o difficulty. Affect pleasant   Recent Labs: 11/16/2020: ALT 20; BUN 14; Creatinine, Ser 0.89; Hemoglobin 12.0; Platelets 245; Potassium 3.9; Sodium 141  Personally reviewed   Wt Readings from Last 3 Encounters:  11/23/20 105.2 kg (232 lb)  08/24/20 103.8 kg (228 lb 12 oz)  05/13/20 105 kg (  231 lb 8 oz)      ASSESSMENT AND PLAN:  1. R breast invasive ductal carcinoma, T3N1M1, stage IV, ER-/PR-/HER2+, with metastases to b/l lungs, biopsy confirmed - I reviewed echos personally. EF and Doppler parameters stable. No HF on exam. Continue Herceptin.   2. HTN -Blood pressure well controlled. Continue current regimen.  Arvilla Meres, MD  11:08 AM  [facility] [address] [facility] [place] [address] [phone] (office) [phone] (fax)

## 2020-11-23 NOTE — Telephone Encounter (Signed)
Left message with moved upcoming appointment due to provider's template change. Gave option to call back to reschedule if needed.

## 2020-11-23 NOTE — Patient Instructions (Signed)
Please call our office to schedule your follow up and echocardiogram in 6 months  If you have any questions or concerns before your next appointment please send [place] a message through [place] or call our office at [phone].    TO LEAVE A MESSAGE FOR THE NURSE SELECT OPTION 2, PLEASE LEAVE A MESSAGE INCLUDING: . YOUR NAME . DATE OF BIRTH . CALL BACK NUMBER . REASON FOR CALL**this is important as we prioritize the call backs  YOU WILL RECEIVE A CALL BACK THE SAME DAY AS LONG AS YOU CALL BEFORE 4:00 PM

## 2020-11-23 NOTE — Progress Notes (Signed)
  Echocardiogram 2D Echocardiogram has been performed.  [name] [name] 11/23/2020, 10:16 AM

## 2020-11-23 NOTE — Progress Notes (Signed)
[facility]   [phone] [phone]   Clinic Follow up Note   Patient Care Team: Virginia Rochester, [place] as PCP - General (Family Medicine) [name], DO (Family Medicine) Stark Klein, MD as Consulting Physician (General Surgery) [name], MD as Consulting Physician (Hematology) Clydie Braun, MD (Obstetrics and Gynecology) Haroldine Laws, Shaune Pascal, MD as Consulting Physician (Cardiology)  Date of Service:  11/24/2020  CHIEF COMPLAINT: f/u of right breast cancer  SUMMARY OF ONCOLOGIC HISTORY: Oncology History Overview Note  Breast cancer 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   Staging form: Breast, AJCC 7th Edition     Clinical stage from 07/22/2014: Stage IV (T3, N1, M1) - Unsigned     Breast cancer 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 ([place])  07/02/2014 Mammogram   Mammogram showed a 2cm right beast mass and a 1.8cm right axillary node. MRI breast on 07/16/2014 showed 7cm R breast lesion and 4.4cm r axillary node    07/02/2014 Imaging   CT CAP: a 4.7cm mass in LUL lung and a 2.1cm mas in RML, and a small nodule in RUL, suspecious for metastasis     07/09/2014 Initial Diagnosis   right IDA with b/l lung lesions, ER-/PR-/HER2+   07/09/2014 Initial Biopsy   US guided right breast mass and axillary node biopsy showed IDA, and DCIS, ER-/PR-/HER2+   07/26/2014 Pathologic Stage   Left lung mass by IR, path revealed high grade carcinoma, morphology similar to breast tumor biopsy, TTF(-), NapsinA(-), ER(-)   08/04/2014 - 03/22/2015 Chemotherapy   weekly Paclitaxel 41m/m2, trastuzumab and pertuzumab every 3 weeks   08/30/2014 Genetic Testing   BreastNext panel was negative. 17 genes including BRCA1, BRCA2, were negative for mutations.    10/04/2014 Imaging   Interval decrease in the right axillary lymphadenopathy. Bilateral pulmonary lesions with left hilar lymphadenopathy also markedly decreased in the interval. The left hilar lymphadenopathy has resolved.   12/20/2014  Imaging   restaging CT showed stable disease, no new lesions    03/29/2015 Pathology Results    right breast lumpectomy showed  chemotherapy treatment effect,  a 1 mm residual tumor,   margins were widely negative, 5 sentinel lymph nodes and 2 axillary lymph nodes were negative.    03/29/2015 Surgery    right breast lumpectomy and sentinel lymph node biopsy  by Dr. BBarry Dienes  04/12/2015 -  Chemotherapy   Herceptin maintenance therapy , 6 mg/kg, every 3 weeks since 04/12/15. Switched to Herceptin Hylecta injection q3weeks after 06/10/19.   05/03/2015 - 06/14/2015 Radiation Therapy   right breast adjuvant irradiation by Dr. MValere Dross   08/28/2016 Imaging   CT CAP w Contrast  IMPRESSION: 1. No acute process or evidence of metastatic disease within the chest, abdomen, or pelvis. 2. Similar to less well-defined left upper lobe density, likely scarring. No evidence of new or progressive pulmonary metastasis. 3. Right nephrolithiasis.   01/22/2017 Imaging   CT Chest W Contrast 01/22/17 IMPRESSION: 1. Stable exam. No new or progressive findings. No evidence for metastatic disease. 2. Left upper lobe architectural distortion/scarring is stable. 3. Nonobstructing right renal stone.   07/31/2017 Imaging   Bone Scan Whole Body 07/31/17  IMPRESSION: 1. No scintigraphic evidence of osseous metastatic disease. 2. Thoracolumbar scoliosis.    07/31/2017 Imaging   CT CAP W Contrast 07/31/17 IMPRESSION: 1. No findings of active or recurrent malignancy. 2. Other imaging findings of potential clinical significance: Aortic Atherosclerosis (ICD10-I70.0). Mild cardiomegaly. Postoperative and radiation therapy findings in the right chest. Nonobstructive right nephrolithiasis. Thoracolumbar scoliosis.  01/20/2018 Echocardiogram   ECHO 01/20/2018 Study Conclusions  - Left ventricle: The cavity size was normal. Wall thickness was   normal. Systolic function was normal. The estimated ejection    fraction was in the range of 55% to 60%. Wall motion was normal;   there were no regional wall motion abnormalities. Doppler   parameters are consistent with abnormal left ventricular   relaxation (grade 1 diastolic dysfunction). - Impressions: GLS -20.7% LS&' 10.4 cm.    02/17/2018 Imaging   02/17/2018 CT CAP IMPRESSION: 1. No evidence for residual or recurrent tumor or metastatic disease. 2.  Aortic Atherosclerosis (ICD10-I70.0). 3. Right renal calculi. 4. Scoliosis.   08/11/2018 Imaging   CT CAP W Contrast 08/11/18  IMPRESSION: No evidence for localized recurrence or metastatic disease in the chest, abdomen or pelvis.   02/23/2019 Imaging   CT CAP W Contrast  IMPRESSION: 1. No findings of active malignancy in the chest, abdomen, or pelvis. 2. 3 mm right kidney lower pole nonobstructive renal calculus. 3. Thoracolumbar scoliosis.   12/08/2019 Imaging   CT CAP W contrast  IMPRESSION: 1. Unchanged post treatment appearance of the lungs. No evidence of recurrent or new metastatic disease in the chest, abdomen, or pelvis.   2. Postoperative findings of right lumpectomy and axillary lymph node dissection.   3.  Nonobstructive right nephrolithiasis.   11/17/2020 Imaging   CT CAP IMPRESSION: 1. No evidence of recurrence or new metastatic disease within the chest, abdomen, or pelvis. 2. Similar post treatment changes in the lungs and postoperative findings of right lumpectomy and axillary lymph node dissection. 3. Colonic diverticulosis without findings of acute diverticulitis.      CURRENT THERAPY:  MaintenanceHerceptin every 3 weeks since 04/12/15. Switched to Herceptin Hylecta injection q3weeks after 06/10/19.  INTERVAL HISTORY:  [name] [name] is here for a follow up of breast cancer. She was last seen by me on 08/24/20. She presents to the clinic alone. She wanted to discuss breast reduction. She notes her breasts are very heavy and "hang," which can cause issues in  the folds under her breasts. She notes she saw her dermatologist for her hair loss. She was placed on minoxidil and fluocinolone oil. She notes she discussed this with Dr. Haroldine Laws, who felt comfortable with this. She is working from home now. 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 She also notes she will be starting training in two weeks, and she raised concern regarding treatment that week.  REVIEW OF SYSTEMS:   Constitutional: Denies fevers, chills or abnormal weight loss Eyes: Denies blurriness of vision Ears, nose, mouth, throat, and face: Denies mucositis or sore throat Respiratory: Denies cough, dyspnea or wheezes Cardiovascular: Denies palpitation, chest discomfort or lower extremity swelling Gastrointestinal:  Denies nausea, heartburn or change in bowel habits Skin: Denies abnormal skin rashes Lymphatics: Denies new lymphadenopathy or easy bruising Neurological:Denies numbness, tingling or new weaknesses Behavioral/Psych: Mood is stable, no new changes  All other systems were reviewed with the patient and are negative.  MEDICAL HISTORY:  Past Medical History:  Diagnosis Date  . Breast cancer ([place])   . Breast cancer ([place])   . CHF (congestive heart failure) ([place])   . GERD (gastroesophageal reflux disease)    during pregnancy   . Pneumonia    hx of pneumonia 08/2013   . S/P radiation therapy 05/03/2015 through 06/14/2015    Right breast 4680 cGy in 26 sessions, right  breast boost 1000 cGy in 5 sessions. Right supraclavicular/axillary region 4680 cGy with a supplemental PA field to bring the axillary dose up to 4500 cGy in 26 sessions    SURGICAL HISTORY: Past Surgical History:  Procedure Laterality Date  . BREAST LUMPECTOMY WITH RADIOACTIVE SEED AND SENTINEL LYMPH NODE BIOPSY Right 03/29/2015   Procedure: BREAST LUMPECTOMY WITH RADIOACTIVE SEED AND SENTINEL  LYMPH NODE BIOPSY;  Surgeon: Stark Klein, MD;  Location: [facility];  Service: General;  Laterality: Right;  . CESAREAN SECTION    . CHOLECYSTECTOMY    . ESSURE TUBAL LIGATION    . PORTACATH PLACEMENT Left 07/21/2014   Procedure: INSERTION PORT-A-CATH;  Surgeon: Stark Klein, MD;  Location: [facility];  Service: General;  Laterality: Left;  . WISDOM TOOTH EXTRACTION      I have reviewed the social history and family history with the patient and they are unchanged from previous note.  ALLERGIES:  is allergic to adhesive [tape], aspirin, and codeine.  MEDICATIONS:  Current Outpatient Medications  Medication Sig Dispense Refill  . acetaminophen (TYLENOL) 500 MG tablet Take 500 mg by mouth every 6 (six) hours as needed.    . Ascorbic Acid (VITAMIN C) 500 MG CHEW     . Calcium-Phosphorus-Vitamin D (CALCIUM GUMMIES PO) Take by mouth daily.    . Camphor-Eucalyptus-Menthol (VICKS VAPORUB EX) Apply 1 application topically at bedtime. Applies under nose, throat and on chest.    . cetirizine (ZYRTEC) 10 MG tablet Take 10 mg by mouth daily as needed for allergies (allergies).     . Fluocinolone Acetonide Body 0.01 % OIL Apply topically 4 (four) times a week.    . fluticasone (FLONASE) 50 MCG/ACT nasal spray Place into both nostrils daily.    Marland Kitchen gabapentin (NEURONTIN) 100 MG capsule Take 2 capsules (200 mg total) by mouth at bedtime. 60 capsule 3  . hydrocortisone 2.5 % cream Apply 1 application topically 2 (two) times daily as needed.    Marland Kitchen ibuprofen (ADVIL,MOTRIN) 800 MG tablet Take 1 tablet (800 mg total) by mouth every 8 (eight) hours as needed. 60 tablet 1  . lidocaine-prilocaine (EMLA) cream APPLY TOPICALLY TO THE AFFECTED AREA AS NEEDED 30 g 2  . loperamide (IMODIUM A-D) 2 MG tablet Take 1 tablet (2 mg total) by mouth 4 (four) times daily as needed for diarrhea or loose stools. 30 tablet 0  . losartan (COZAAR) 25 MG tablet TAKE 1 TABLET(25 MG) BY MOUTH DAILY 30 tablet 3  . minoxidil  (LONITEN) 2.5 MG tablet Take 2.5 mg by mouth daily. 1/2 tab once daily    . Multiple Vitamins-Minerals (MULTIVITAMIN GUMMIES ADULT PO) Take 1 each by mouth daily. Women's Vitafusion gummie    . spironolactone (ALDACTONE) 25 MG tablet TAKE 1 TABLET BY MOUTH DAILY 90 tablet 3   No current facility-administered medications for this visit.   Facility-Administered Medications Ordered in Other Visits  Medication Dose Route Frequency Provider Last Rate Last Admin  . acetaminophen (TYLENOL) tablet 650 mg  650 mg Oral Once [name], MD      . heparin lock flush 100 unit/mL  500 Units Intracatheter Once PRN [name], MD      . sodium chloride 0.9 % injection 10 mL  10 mL Intracatheter PRN [name], MD   10 mL at 05/01/16 1622  . sodium chloride 0.9 % injection 10 mL  10 mL Intravenous PRN [name], MD      . sodium chloride 0.9 % injection 10 mL  10 mL Intracatheter PRN [name], MD   10 mL at 03/18/19 1715  . sodium chloride flush (NS) 0.9 % injection 10 mL  10 mL Intravenous PRN [name], MD        PHYSICAL EXAMINATION: ECOG PERFORMANCE STATUS: 0 - Asymptomatic  Vitals:   11/24/20 0923  BP: (!) 148/78  Pulse: 84  Resp: 19  Temp: (!) 97.5 F (36.4 C)  SpO2: 97%   Filed Weights   11/24/20 0923  Weight: 231 lb 14.4 oz (105.2 kg)    GENERAL:alert, no distress and comfortable SKIN: skin color, texture, turgor are normal, no rashes or significant lesions EYES: normal, Conjunctiva are pink and non-injected, sclera clear  NECK: supple, thyroid normal size, non-tender, without nodularity LYMPH:  no palpable lymphadenopathy in the cervical, axillary  LUNGS: clear to auscultation and percussion with normal breathing effort HEART: regular rate & rhythm and no murmurs and no lower extremity edema ABDOMEN:abdomen soft, non-tender and normal bowel sounds Musculoskeletal:no cyanosis of digits and no clubbing  NEURO: alert & oriented x 3 with fluent speech, no focal motor/sensory  deficits  LABORATORY DATA:  I have reviewed the data as listed CBC Latest Ref Rng & Units 11/16/2020 06/22/2020 03/30/2020  WBC 4.0 - 10.5 K/uL 6.5 7.3 7.3  Hemoglobin 12.0 - 15.0 g/dL 12.0 11.9(L) 12.0  Hematocrit 36.0 - 46.0 % 36.6 36.0 36.3  Platelets 150 - 400 K/uL 245 231 215     CMP Latest Ref Rng & Units 11/16/2020 06/22/2020 03/30/2020  Glucose 70 - 99 mg/dL 81 113(H) 110(H)  BUN 6 - 20 mg/dL 14 13 15   Creatinine 0.44 - 1.00 mg/dL 0.89 0.90 0.96  Sodium 135 - 145 mmol/L 141 141 140  Potassium 3.5 - 5.1 mmol/L 3.9 3.5 3.8  Chloride 98 - 111 mmol/L 105 106 105  CO2 22 - 32 mmol/L 27 27 28   Calcium 8.9 - 10.3 mg/dL 9.0 8.9 9.8  Total Protein 6.5 - 8.1 g/dL 7.9 7.9 7.8  Total Bilirubin 0.3 - 1.2 mg/dL 0.3 0.6 0.4  Alkaline Phos 38 - 126 U/L 74 65 72  AST 15 - 41 U/L 17 17 19   ALT 0 - 44 U/L 20 21 23       RADIOGRAPHIC STUDIES: I have personally reviewed the radiological images as listed and agreed with the findings in the report. ECHOCARDIOGRAM COMPLETE  Result Date: 11/23/2020    ECHOCARDIOGRAM REPORT   Patient Name:   [name] [name] Date of Exam: 11/23/2020 Medical Rec #:  [id number]        Height:       70.0 in Accession #:    [id number]       Weight:       228.7 lb Date of Birth:  09/07/1970       BSA:          2.210 m Patient Age:    50 years         BP:           146/70 mmHg Patient Gender: F                HR:           67 bpm. Exam Location:  Outpatient Procedure: 2D Echo, 3D Echo, Cardiac Doppler, Color Doppler and Strain Analysis Indications:    Z51.11 Encounter for antineoplastic chemotheraphy  History:        Patient has prior history of Echocardiogram examinations, most  recent 04/11/2020. GERD. Breast Cancer.  Sonographer:    Jonelle Sidle Dance Referring Phys: [id number] DANIEL R [place]  1. Left ventricular ejection fraction, by estimation, is 60 to 65%. The left ventricle has normal function. The left ventricle has no regional wall motion  abnormalities. Left ventricular diastolic parameters were normal.  2. Right ventricular systolic function is normal. The right ventricular size is normal.  3. Left atrial size was mildly dilated.  4. The mitral valve is normal in structure. Trivial mitral valve regurgitation. No evidence of mitral stenosis.  5. The aortic valve is normal in structure. Aortic valve regurgitation is not visualized. No aortic stenosis is present.  6. The inferior vena cava is normal in size with greater than 50% respiratory variability, suggesting right atrial pressure of 3 mmHg. FINDINGS  Left Ventricle: Left ventricular ejection fraction, by estimation, is 60 to 65%. The left ventricle has normal function. The left ventricle has no regional wall motion abnormalities. The left ventricular internal cavity size was normal in size. There is  no left ventricular hypertrophy. Left ventricular diastolic parameters were normal. Right Ventricle: The right ventricular size is normal. No increase in right ventricular wall thickness. Right ventricular systolic function is normal. Left Atrium: Left atrial size was mildly dilated. Right Atrium: Right atrial size was normal in size. Pericardium: There is no evidence of pericardial effusion. Mitral Valve: The mitral valve is normal in structure. Trivial mitral valve regurgitation. No evidence of mitral valve stenosis. Tricuspid Valve: The tricuspid valve is normal in structure. Tricuspid valve regurgitation is trivial. No evidence of tricuspid stenosis. Aortic Valve: The aortic valve is normal in structure. Aortic valve regurgitation is not visualized. No aortic stenosis is present. Pulmonic Valve: The pulmonic valve was normal in structure. Pulmonic valve regurgitation is not visualized. No evidence of pulmonic stenosis. Aorta: The aortic root is normal in size and structure. Venous: The inferior vena cava is normal in size with greater than 50% respiratory variability, suggesting right atrial  pressure of 3 mmHg. IAS/Shunts: No atrial level shunt detected by color flow Doppler.  LEFT VENTRICLE PLAX 2D LVIDd:         4.60 cm  Diastology LVIDs:         3.20 cm  LV e' medial:    6.42 cm/s LV PW:         1.20 cm  LV E/e' medial:  14.9 LV IVS:        0.70 cm  LV e' lateral:   11.50 cm/s LVOT diam:     2.00 cm  LV E/e' lateral: 8.3 LV SV:         72 LV SV Index:   33       2D Longitudinal Strain LVOT Area:     3.14 cm 2D Strain GLS Avg:     -18.0 %                          3D Volume EF:                         3D EF:        60 %                         LV EDV:       151 ml  LV ESV:       60 ml                         LV SV:        90 ml RIGHT VENTRICLE             IVC RV Basal diam:  2.80 cm     IVC diam: 1.60 cm RV S prime:     10.70 cm/s TAPSE (M-mode): 2.2 cm LEFT ATRIUM             Index       RIGHT ATRIUM           Index LA diam:        4.10 cm 1.86 cm/m  RA Area:     14.60 cm LA Vol (A2C):   79.2 ml 35.84 ml/m RA Volume:   34.90 ml  15.79 ml/m LA Vol (A4C):   64.0 ml 28.96 ml/m LA Biplane Vol: 77.4 ml 35.02 ml/m  AORTIC VALVE LVOT Vmax:   84.20 cm/s LVOT Vmean:  62.400 cm/s LVOT VTI:    0.230 m  AORTA Ao Root diam: 2.90 cm Ao Asc diam:  3.10 cm MITRAL VALVE MV Area (PHT): 3.11 cm    SHUNTS MV Decel Time: 244 msec    Systemic VTI:  0.23 m MV E velocity: 95.50 cm/s  Systemic Diam: 2.00 cm MV A velocity: 78.80 cm/s MV E/A ratio:  1.21 Glori Bickers MD Electronically signed by Glori Bickers MD Signature Date/Time: 11/23/2020/11:11:44 AM    Final      ASSESSMENT & PLAN:  [name] [name] is a 50 y.o. 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 Tolerating well. -She underwent hysteroscopy with D&C on  11/27/18 to remove benign polyps that were recently found. -Hermost recent CT CAP from 11/17/20 showedstable post treatment appearance of the lungs. No evidence of recurrence or new metastatic disease.  -She is clinically doing well and stable. She will proceed with Herceptin today and continue every 3 weeks.  -We can consider stopping her Herceptin if she remains NED after 10 years. -She is clinically doing well, asymptomatic.  I personally reviewed her restaging CT scan images, which showed no evidence of disease.  I reviewed with her today. -Continue with labsevery 12 weeks,continue port flushevery6weeks, repeat scan in one year -She would like to consider breast reduction. I feel comfortable with this and will refer her back to Dr. BBarry Dienes  2. HTN -On losartangivingby Dr. BHaroldine Lawsin June 2019, dose increasedin 08/2018.She sees him every 6 months. -11/23/20 ECHO showed EF at 60-65%  3. Intermittent Neuropathy  -Residual effects from her prior chemo treatment -She notes her intermittent neuropathy is happening more frequently, mostly at night or relaxed with tingling in hands and LE but mainly in left Lower leg.  -For tingling, she takes Gabapentin 1062mat nightly first. Will titrate based on need. She can also take B complex if her Multivitamin does not have enough B12 or B6.   Plan -I will refer her back to Dr. ByBarry Dieneso discuss breast reduction. -Return for next Herceptin injection 12/07/20 and every 3 weeks after  -Port Flush every 6 weeks  -Labs every 12 weeks -f/u in 4 months    No problem-specific Assessment &  Plan notes found for this encounter.   No orders of the defined types were placed in this encounter.  All questions were answered. The patient knows to call the clinic with any problems, questions or concerns. No barriers to learning was detected. The total time spent in the appointment was 30 minutes.     [name], MD 11/24/2020   I, Wilburn Mylar, am acting as scribe for [name], MD.   I have reviewed the above documentation for accuracy and completeness, and I agree with the above.

## 2020-11-23 NOTE — Addendum Note (Signed)
Encounter addended by: Jolaine Artist, MD on: 11/23/2020 3:30 PM  Actions taken: Level of Service modified, Visit diagnoses modified

## 2020-11-24 ENCOUNTER — Encounter: Payer: Self-pay | Admitting: Hematology

## 2020-11-24 ENCOUNTER — Inpatient Hospital Stay (HOSPITAL_BASED_OUTPATIENT_CLINIC_OR_DEPARTMENT_OTHER): Payer: BC Managed Care – PPO | Admitting: Hematology

## 2020-11-24 ENCOUNTER — Inpatient Hospital Stay: Payer: BC Managed Care – PPO | Admitting: Hematology

## 2020-11-24 VITALS — BP 148/78 | HR 84 | Temp 97.5°F | Resp 19 | Ht 70.0 in | Wt 231.9 lb

## 2020-11-24 DIAGNOSIS — C7800 Secondary malignant neoplasm of unspecified lung: Secondary | ICD-10-CM

## 2020-11-24 DIAGNOSIS — C50911 Malignant neoplasm of unspecified site of right female breast: Secondary | ICD-10-CM | POA: Diagnosis not present

## 2020-11-24 DIAGNOSIS — G620 Drug-induced polyneuropathy: Secondary | ICD-10-CM

## 2020-11-24 DIAGNOSIS — T451X5A Adverse effect of antineoplastic and immunosuppressive drugs, initial encounter: Secondary | ICD-10-CM | POA: Diagnosis not present

## 2020-11-30 ENCOUNTER — Telehealth: Payer: Self-pay | Admitting: Hematology

## 2020-11-30 NOTE — Telephone Encounter (Signed)
Scheduled appts per 4/20 sch msg. Called pt, no answer. Left msg with appts dates and times.

## 2020-12-07 ENCOUNTER — Other Ambulatory Visit: Payer: Self-pay | Admitting: Hematology

## 2020-12-07 ENCOUNTER — Inpatient Hospital Stay: Payer: BC Managed Care – PPO

## 2020-12-07 ENCOUNTER — Other Ambulatory Visit: Payer: Self-pay

## 2020-12-07 VITALS — BP 118/55 | HR 71 | Temp 98.6°F | Resp 18

## 2020-12-07 DIAGNOSIS — C7800 Secondary malignant neoplasm of unspecified lung: Secondary | ICD-10-CM

## 2020-12-07 DIAGNOSIS — Z5112 Encounter for antineoplastic immunotherapy: Secondary | ICD-10-CM | POA: Diagnosis not present

## 2020-12-07 DIAGNOSIS — C50911 Malignant neoplasm of unspecified site of right female breast: Secondary | ICD-10-CM

## 2020-12-07 MED ORDER — TRASTUZUMAB-HYALURONIDASE-OYSK 600-10000 MG-UNT/5ML ~~LOC~~ SOLN
600.0000 mg | Freq: Once | SUBCUTANEOUS | Status: AC
  Administered 2020-12-07: 600 mg via SUBCUTANEOUS
  Filled 2020-12-07: qty 5

## 2020-12-07 NOTE — Patient Instructions (Signed)
[facility]  Discharge Instructions: Thank you for choosing [facility] to provide your oncology and hematology care.   If you have a lab appointment with the [facility], please go directly to the [facility] and check in at the registration area.   Wear comfortable clothing and clothing appropriate for easy access to any Portacath or PICC line.   We strive to give you quality time with your provider. You may need to reschedule your appointment if you arrive late (15 or more minutes).  Arriving late affects you and other patients whose appointments are after yours.  Also, if you miss three or more appointments without notifying the office, you may be dismissed from the clinic at the provider's discretion.      For prescription refill requests, have your pharmacy contact our office and allow 72 hours for refills to be completed.    Today you received the following chemotherapy and/or immunotherapy agents herceptin hylecta    To help prevent nausea and vomiting after your treatment, we encourage you to take your nausea medication as directed.  BELOW ARE SYMPTOMS THAT SHOULD BE REPORTED IMMEDIATELY: . *FEVER GREATER THAN 100.4 F (38 C) OR HIGHER . *CHILLS OR SWEATING . *NAUSEA AND VOMITING THAT IS NOT CONTROLLED WITH YOUR NAUSEA MEDICATION . *UNUSUAL SHORTNESS OF BREATH . *UNUSUAL BRUISING OR BLEEDING . *URINARY PROBLEMS (pain or burning when urinating, or frequent urination) . *BOWEL PROBLEMS (unusual diarrhea, constipation, pain near the anus) . TENDERNESS IN MOUTH AND THROAT WITH OR WITHOUT PRESENCE OF ULCERS (sore throat, sores in mouth, or a toothache) . UNUSUAL RASH, SWELLING OR PAIN  . UNUSUAL VAGINAL DISCHARGE OR ITCHING   Items with * indicate a potential emergency and should be followed up as soon as possible or go to the Emergency Department if any problems should occur.  Please show the CHEMOTHERAPY ALERT CARD or IMMUNOTHERAPY  ALERT CARD at check-in to the Emergency Department and triage nurse.  Should you have questions after your visit or need to cancel or reschedule your appointment, please contact [facility]  Dept: [phone]  and follow the prompts.  Office hours are 8:00 a.m. to 4:30 p.m. Monday - Friday. Please note that voicemails left after 4:00 p.m. may not be returned until the following business day.  We are closed weekends and major holidays. You have access to a nurse at all times for urgent questions. Please call the main number to the clinic Dept: [phone] and follow the prompts.   For any non-urgent questions, you may also contact your provider using MyChart. We now offer e-Visits for anyone 41 and older to request care online for non-urgent symptoms. For details visit [email or web address].   Also download the MyChart app! Go to the app store, search "MyChart", open the app, select [facility], and log in with your MyChart username and password.  Due to Covid, a mask is required upon entering the hospital/clinic. If you do not have a mask, one will be given to you upon arrival. For doctor visits, patients may have 1 support person aged 4 or older with them. For treatment visits, patients cannot have anyone with them due to current Covid guidelines and our immunocompromised population.

## 2020-12-13 ENCOUNTER — Telehealth: Payer: Self-pay | Admitting: Hematology

## 2020-12-13 NOTE — Telephone Encounter (Signed)
R/s appts per 5/2 sch msg. Pt stated she will look at updated appt ties in [place] and will call back if anything needs to be changed.

## 2020-12-19 ENCOUNTER — Other Ambulatory Visit: Payer: Self-pay | Admitting: Hematology

## 2020-12-19 DIAGNOSIS — C50911 Malignant neoplasm of unspecified site of right female breast: Secondary | ICD-10-CM

## 2020-12-19 DIAGNOSIS — T451X5A Adverse effect of antineoplastic and immunosuppressive drugs, initial encounter: Secondary | ICD-10-CM

## 2020-12-19 DIAGNOSIS — G620 Drug-induced polyneuropathy: Secondary | ICD-10-CM

## 2020-12-27 DIAGNOSIS — R7303 Prediabetes: Secondary | ICD-10-CM | POA: Insufficient documentation

## 2020-12-28 ENCOUNTER — Ambulatory Visit: Payer: BC Managed Care – PPO

## 2020-12-28 ENCOUNTER — Inpatient Hospital Stay: Payer: BC Managed Care – PPO | Attending: Hematology

## 2020-12-28 ENCOUNTER — Inpatient Hospital Stay: Payer: BC Managed Care – PPO

## 2020-12-28 ENCOUNTER — Other Ambulatory Visit: Payer: Self-pay

## 2020-12-28 ENCOUNTER — Other Ambulatory Visit: Payer: BC Managed Care – PPO

## 2020-12-28 VITALS — BP 131/74 | HR 89 | Temp 98.9°F | Resp 16

## 2020-12-28 DIAGNOSIS — Z95828 Presence of other vascular implants and grafts: Secondary | ICD-10-CM

## 2020-12-28 DIAGNOSIS — Z5112 Encounter for antineoplastic immunotherapy: Secondary | ICD-10-CM | POA: Diagnosis not present

## 2020-12-28 DIAGNOSIS — C50911 Malignant neoplasm of unspecified site of right female breast: Secondary | ICD-10-CM

## 2020-12-28 DIAGNOSIS — C50311 Malignant neoplasm of lower-inner quadrant of right female breast: Secondary | ICD-10-CM | POA: Diagnosis present

## 2020-12-28 DIAGNOSIS — Z171 Estrogen receptor negative status [ER-]: Secondary | ICD-10-CM

## 2020-12-28 DIAGNOSIS — C50511 Malignant neoplasm of lower-outer quadrant of right female breast: Secondary | ICD-10-CM

## 2020-12-28 LAB — CBC WITH DIFFERENTIAL/PLATELET
Abs Immature Granulocytes: 0.01 10*3/uL (ref 0.00–0.07)
Basophils Absolute: 0 10*3/uL (ref 0.0–0.1)
Basophils Relative: 0 %
Eosinophils Absolute: 0.1 10*3/uL (ref 0.0–0.5)
Eosinophils Relative: 1 %
HCT: 36.6 % (ref 36.0–46.0)
Hemoglobin: 12 g/dL (ref 12.0–15.0)
Immature Granulocytes: 0 %
Lymphocytes Relative: 26 %
Lymphs Abs: 1.8 10*3/uL (ref 0.7–4.0)
MCH: 29.6 pg (ref 26.0–34.0)
MCHC: 32.8 g/dL (ref 30.0–36.0)
MCV: 90.4 fL (ref 80.0–100.0)
Monocytes Absolute: 0.4 10*3/uL (ref 0.1–1.0)
Monocytes Relative: 6 %
Neutro Abs: 4.5 10*3/uL (ref 1.7–7.7)
Neutrophils Relative %: 67 %
Platelets: 236 10*3/uL (ref 150–400)
RBC: 4.05 MIL/uL (ref 3.87–5.11)
RDW: 12.9 % (ref 11.5–15.5)
WBC: 6.7 10*3/uL (ref 4.0–10.5)
nRBC: 0 % (ref 0.0–0.2)

## 2020-12-28 LAB — COMPREHENSIVE METABOLIC PANEL
ALT: 13 U/L (ref 0–44)
AST: 16 U/L (ref 15–41)
Albumin: 3.6 g/dL (ref 3.5–5.0)
Alkaline Phosphatase: 65 U/L (ref 38–126)
Anion gap: 9 (ref 5–15)
BUN: 11 mg/dL (ref 6–20)
CO2: 26 mmol/L (ref 22–32)
Calcium: 9.1 mg/dL (ref 8.9–10.3)
Chloride: 105 mmol/L (ref 98–111)
Creatinine, Ser: 0.91 mg/dL (ref 0.44–1.00)
GFR, Estimated: >60 mL/min (ref >60)
Glucose, Bld: 132 mg/dL — ABNORMAL HIGH (ref 70–99)
Potassium: 3.7 mmol/L (ref 3.5–5.1)
Sodium: 140 mmol/L (ref 135–145)
Total Bilirubin: 0.7 mg/dL (ref 0.3–1.2)
Total Protein: 7.8 g/dL (ref 6.5–8.1)

## 2020-12-28 MED ORDER — LIDOCAINE-PRILOCAINE 2.5-2.5 % EX CREA: TOPICAL_CREAM | CUTANEOUS | 2 refills | Status: DC

## 2020-12-28 MED ORDER — HEPARIN SOD (PORK) LOCK FLUSH 100 UNIT/ML IV SOLN
500.0000 [IU] | Freq: Once | INTRAVENOUS | Status: AC | PRN
  Administered 2020-12-28: 500 [IU] via INTRAVENOUS
  Filled 2020-12-28: qty 5

## 2020-12-28 MED ORDER — TRASTUZUMAB-HYALURONIDASE-OYSK 600-10000 MG-UNT/5ML ~~LOC~~ SOLN
600.0000 mg | Freq: Once | SUBCUTANEOUS | Status: AC
  Administered 2020-12-28: 600 mg via SUBCUTANEOUS
  Filled 2020-12-28: qty 5

## 2020-12-28 MED ORDER — SODIUM CHLORIDE 0.9% FLUSH
10.0000 mL | Freq: Once | INTRAVENOUS | Status: AC
  Administered 2020-12-28: 10 mL
  Filled 2020-12-28: qty 10

## 2020-12-28 NOTE — Patient Instructions (Signed)
[facility] Discharge Instructions for Patients Receiving Chemotherapy  Today you received the following chemotherapy agents: trastuzumab hyaluronidase.   To help prevent nausea and vomiting after your treatment, we encourage you to take your nausea medication as directed.   If you develop nausea and vomiting that is not controlled by your nausea medication, call the clinic.   BELOW ARE SYMPTOMS THAT SHOULD BE REPORTED IMMEDIATELY:  *FEVER GREATER THAN 100.5 F  *CHILLS WITH OR WITHOUT FEVER  NAUSEA AND VOMITING THAT IS NOT CONTROLLED WITH YOUR NAUSEA MEDICATION  *UNUSUAL SHORTNESS OF BREATH  *UNUSUAL BRUISING OR BLEEDING  TENDERNESS IN MOUTH AND THROAT WITH OR WITHOUT PRESENCE OF ULCERS  *URINARY PROBLEMS  *BOWEL PROBLEMS  UNUSUAL RASH Items with * indicate a potential emergency and should be followed up as soon as possible.  Feel free to call the clinic should you have any questions or concerns. The clinic phone number is [phone].  Please show the [facility] at check-in to the Emergency Department and triage nurse.

## 2021-01-18 ENCOUNTER — Ambulatory Visit: Payer: BC Managed Care – PPO

## 2021-01-18 ENCOUNTER — Other Ambulatory Visit: Payer: BC Managed Care – PPO

## 2021-01-18 ENCOUNTER — Inpatient Hospital Stay: Payer: BC Managed Care – PPO | Attending: Hematology

## 2021-01-18 ENCOUNTER — Other Ambulatory Visit: Payer: Self-pay

## 2021-01-18 VITALS — BP 159/81 | HR 67 | Temp 98.9°F | Resp 16 | Wt 230.0 lb

## 2021-01-18 DIAGNOSIS — C7800 Secondary malignant neoplasm of unspecified lung: Secondary | ICD-10-CM | POA: Diagnosis not present

## 2021-01-18 DIAGNOSIS — C50911 Malignant neoplasm of unspecified site of right female breast: Secondary | ICD-10-CM | POA: Insufficient documentation

## 2021-01-18 DIAGNOSIS — Z5112 Encounter for antineoplastic immunotherapy: Secondary | ICD-10-CM | POA: Diagnosis present

## 2021-01-18 MED ORDER — TRASTUZUMAB-HYALURONIDASE-OYSK 600-10000 MG-UNT/5ML ~~LOC~~ SOLN
600.0000 mg | Freq: Once | SUBCUTANEOUS | Status: AC
  Administered 2021-01-18: 600 mg via SUBCUTANEOUS
  Filled 2021-01-18: qty 5

## 2021-01-18 NOTE — Patient Instructions (Signed)
[facility]  Discharge Instructions: Thank you for choosing [facility] to provide your oncology and hematology care.   If you have a lab appointment with the [facility], please go directly to the [facility] and check in at the registration area.   Wear comfortable clothing and clothing appropriate for easy access to any Portacath or PICC line.   We strive to give you quality time with your provider. You may need to reschedule your appointment if you arrive late (15 or more minutes).  Arriving late affects you and other patients whose appointments are after yours.  Also, if you miss three or more appointments without notifying the office, you may be dismissed from the clinic at the provider's discretion.      For prescription refill requests, have your pharmacy contact our office and allow 72 hours for refills to be completed.    Today you received the following chemotherapy and/or immunotherapy agents: Herceptin Hylecta.     To help prevent nausea and vomiting after your treatment, we encourage you to take your nausea medication as directed.  BELOW ARE SYMPTOMS THAT SHOULD BE REPORTED IMMEDIATELY: . *FEVER GREATER THAN 100.4 F (38 C) OR HIGHER . *CHILLS OR SWEATING . *NAUSEA AND VOMITING THAT IS NOT CONTROLLED WITH YOUR NAUSEA MEDICATION . *UNUSUAL SHORTNESS OF BREATH . *UNUSUAL BRUISING OR BLEEDING . *URINARY PROBLEMS (pain or burning when urinating, or frequent urination) . *BOWEL PROBLEMS (unusual diarrhea, constipation, pain near the anus) . TENDERNESS IN MOUTH AND THROAT WITH OR WITHOUT PRESENCE OF ULCERS (sore throat, sores in mouth, or a toothache) . UNUSUAL RASH, SWELLING OR PAIN  . UNUSUAL VAGINAL DISCHARGE OR ITCHING   Items with * indicate a potential emergency and should be followed up as soon as possible or go to the Emergency Department if any problems should occur.  Please show the CHEMOTHERAPY ALERT CARD or  IMMUNOTHERAPY ALERT CARD at check-in to the Emergency Department and triage nurse.  Should you have questions after your visit or need to cancel or reschedule your appointment, please contact [facility]  Dept: [phone]  and follow the prompts.  Office hours are 8:00 a.m. to 4:30 p.m. Monday - Friday. Please note that voicemails left after 4:00 p.m. may not be returned until the following business day.  We are closed weekends and major holidays. You have access to a nurse at all times for urgent questions. Please call the main number to the clinic Dept: [phone] and follow the prompts.   For any non-urgent questions, you may also contact your provider using MyChart. We now offer e-Visits for anyone 33 and older to request care online for non-urgent symptoms. For details visit [email or web address].   Also download the MyChart app! Go to the app store, search "MyChart", open the app, select , and log in with your MyChart username and password.  Due to Covid, a mask is required upon entering the hospital/clinic. If you do not have a mask, one will be given to you upon arrival. For doctor visits, patients may have 1 support person aged 50 or older with them. For treatment visits, patients cannot have anyone with them due to current Covid guidelines and our immunocompromised population.

## 2021-01-26 ENCOUNTER — Telehealth: Payer: Self-pay | Admitting: Hematology

## 2021-01-26 NOTE — Telephone Encounter (Signed)
Left message with rescheduled upcoming appointment due to provider on call.

## 2021-02-08 ENCOUNTER — Inpatient Hospital Stay: Payer: BC Managed Care – PPO

## 2021-02-08 ENCOUNTER — Ambulatory Visit: Payer: BC Managed Care – PPO

## 2021-02-08 ENCOUNTER — Other Ambulatory Visit: Payer: BC Managed Care – PPO

## 2021-02-08 ENCOUNTER — Other Ambulatory Visit: Payer: Self-pay

## 2021-02-08 VITALS — BP 162/70 | HR 71 | Temp 98.8°F | Resp 18

## 2021-02-08 DIAGNOSIS — C50911 Malignant neoplasm of unspecified site of right female breast: Secondary | ICD-10-CM

## 2021-02-08 DIAGNOSIS — Z5112 Encounter for antineoplastic immunotherapy: Secondary | ICD-10-CM | POA: Diagnosis not present

## 2021-02-08 DIAGNOSIS — Z95828 Presence of other vascular implants and grafts: Secondary | ICD-10-CM

## 2021-02-08 LAB — COMPREHENSIVE METABOLIC PANEL
ALT: 26 U/L (ref 0–44)
AST: 20 U/L (ref 15–41)
Albumin: 3.4 g/dL — ABNORMAL LOW (ref 3.5–5.0)
Alkaline Phosphatase: 70 U/L (ref 38–126)
Anion gap: 9 (ref 5–15)
BUN: 16 mg/dL (ref 6–20)
CO2: 29 mmol/L (ref 22–32)
Calcium: 9 mg/dL (ref 8.9–10.3)
Chloride: 104 mmol/L (ref 98–111)
Creatinine, Ser: 1.2 mg/dL — ABNORMAL HIGH (ref 0.44–1.00)
GFR, Estimated: 55 mL/min — ABNORMAL LOW (ref >60)
Glucose, Bld: 103 mg/dL — ABNORMAL HIGH (ref 70–99)
Potassium: 3.8 mmol/L (ref 3.5–5.1)
Sodium: 142 mmol/L (ref 135–145)
Total Bilirubin: 0.4 mg/dL (ref 0.3–1.2)
Total Protein: 7.5 g/dL (ref 6.5–8.1)

## 2021-02-08 LAB — CBC WITH DIFFERENTIAL/PLATELET
Abs Immature Granulocytes: 0.01 10*3/uL (ref 0.00–0.07)
Basophils Absolute: 0 10*3/uL (ref 0.0–0.1)
Basophils Relative: 1 %
Eosinophils Absolute: 0.1 10*3/uL (ref 0.0–0.5)
Eosinophils Relative: 1 %
HCT: 35.8 % — ABNORMAL LOW (ref 36.0–46.0)
Hemoglobin: 12.1 g/dL (ref 12.0–15.0)
Immature Granulocytes: 0 %
Lymphocytes Relative: 29 %
Lymphs Abs: 1.9 10*3/uL (ref 0.7–4.0)
MCH: 30 pg (ref 26.0–34.0)
MCHC: 33.8 g/dL (ref 30.0–36.0)
MCV: 88.6 fL (ref 80.0–100.0)
Monocytes Absolute: 0.5 10*3/uL (ref 0.1–1.0)
Monocytes Relative: 7 %
Neutro Abs: 4 10*3/uL (ref 1.7–7.7)
Neutrophils Relative %: 62 %
Platelets: 208 10*3/uL (ref 150–400)
RBC: 4.04 MIL/uL (ref 3.87–5.11)
RDW: 12.5 % (ref 11.5–15.5)
WBC: 6.4 10*3/uL (ref 4.0–10.5)
nRBC: 0 % (ref 0.0–0.2)

## 2021-02-08 MED ORDER — HEPARIN SOD (PORK) LOCK FLUSH 100 UNIT/ML IV SOLN
500.0000 [IU] | Freq: Once | INTRAVENOUS | Status: AC | PRN
  Administered 2021-02-08: 500 [IU] via INTRAVENOUS
  Filled 2021-02-08: qty 5

## 2021-02-08 MED ORDER — TRASTUZUMAB-HYALURONIDASE-OYSK 600-10000 MG-UNT/5ML ~~LOC~~ SOLN
600.0000 mg | Freq: Once | SUBCUTANEOUS | Status: AC
  Administered 2021-02-08: 600 mg via SUBCUTANEOUS
  Filled 2021-02-08: qty 5

## 2021-02-08 MED ORDER — SODIUM CHLORIDE 0.9% FLUSH
10.0000 mL | Freq: Once | INTRAVENOUS | Status: AC
  Administered 2021-02-08: 10 mL
  Filled 2021-02-08: qty 10

## 2021-02-08 NOTE — Patient Instructions (Signed)

## 2021-02-08 NOTE — Patient Instructions (Signed)
Trastuzumab; Hyaluronidase injection What is this medication? TRASTUZUMAB; HYALURONIDASE (tras TOO zoo mab / hye al ur ON i dase) is used to treat breast cancer and stomach cancer. Trastuzumab is a monoclonal antibody.Hyaluronidase is used to improve the effects of trastuzumab. This medicine may be used for other purposes; ask your health care provider orpharmacist if you have questions. COMMON BRAND NAME(S): HERCEPTIN HYLECTA What should I tell my care team before I take this medication? They need to know if you have any of these conditions: heart disease heart failure lung or breathing disease, like asthma an unusual or allergic reaction to trastuzumab, or other medications, foods, dyes, or preservatives pregnant or trying to get pregnant breast-feeding How should I use this medication? This medicine is for injection under the skin. It is given by a health careprofessional in a hospital or clinic setting. Talk to your pediatrician regarding the use of this medicine in children. Thismedicine is not approved for use in children. Overdosage: If you think you have taken too much of this medicine contact apoison control center or emergency room at once. NOTE: This medicine is only for you. Do not share this medicine with others. What if I miss a dose? It is important not to miss a dose. Call your doctor or health careprofessional if you are unable to keep an appointment. What may interact with this medication? This medicine may interact with the following medications: certain types of chemotherapy, such as daunorubicin, doxorubicin, epirubicin, and idarubicin This list may not describe all possible interactions. Give your health care provider a list of all the medicines, herbs, non-prescription drugs, or dietary supplements you use. Also tell them if you smoke, drink alcohol, or use illegaldrugs. Some items may interact with your medicine. What should I watch for while using this  medication? Visit your doctor for checks on your progress. Report any side effects. Continue your course of treatment even though you feel ill unless your doctortells you to stop. Call your doctor or health care professional for advice if you get a fever, chills or sore throat, or other symptoms of a cold or flu. Do not treatyourself. Try to avoid being around people who are sick. You may experience fever, chills and shaking during your first infusion. These effects are usually mild and can be treated with other medicines. Report any side effects during the infusion to your health care professional. Fever andchills usually do not happen with later infusions. Do not become pregnant while taking this medicine or for 7 months after stopping it. Women should inform their doctor if they wish to become pregnant or think they might be pregnant. Women of child-bearing potential will need to have a negative pregnancy test before starting this medicine. There is a potential for serious side effects to an unborn child. Talk to your health care professional or pharmacist for more information. Do not breast-feed an infantwhile taking this medicine or for 7 months after stopping it. What side effects may I notice from receiving this medication? Side effects that you should report to your doctor or health care professionalas soon as possible: allergic reactions like skin rash, itching or hives, swelling of the face, lips, or tongue breathing problems chest pain or palpitations cough fever general ill feeling or flu-like symptoms signs of worsening heart failure like breathing problems; swelling in your legs and feet Side effects that usually do not require medical attention (report these toyour doctor or health care professional if they continue or are bothersome): bone pain changes  in taste diarrhea joint pain nausea/vomiting unusually weak or tired weight loss This list may not describe all possible side  effects. Call your doctor for medical advice about side effects. You may report side effects to FDA at1-800-FDA-1088. Where should I keep my medication? This drug is given in a hospital or clinic and will not be stored at home. NOTE: This sheet is a summary. It may not cover all possible information. If you have questions about this medicine, talk to your doctor, pharmacist, orhealth care provider.  2022 Elsevier/Gold Standard (2017-10-18 21:54:17)

## 2021-02-16 ENCOUNTER — Telehealth: Payer: Self-pay | Admitting: *Deleted

## 2021-02-16 NOTE — Telephone Encounter (Signed)
Left message with details of lab results as we have DPR on file.  Lab appointment scheduling request sent.

## 2021-02-16 NOTE — Telephone Encounter (Signed)
-----   Message from [name], MD sent at 02/11/2021 11:16 PM EDT ----- Please let pt know her lab results, Cr mildly elevated, please encourage her to drink more water, add lab on next appointment, thanks   [name]  02/11/2021

## 2021-02-17 ENCOUNTER — Telehealth: Payer: Self-pay | Admitting: Hematology

## 2021-02-17 NOTE — Telephone Encounter (Signed)
Scheduled appt per 7/7 sch msg. Called pt, no answer. Left msg with appt date and time.

## 2021-03-01 ENCOUNTER — Inpatient Hospital Stay: Payer: BC Managed Care – PPO

## 2021-03-01 ENCOUNTER — Other Ambulatory Visit: Payer: Self-pay

## 2021-03-01 ENCOUNTER — Ambulatory Visit: Payer: BC Managed Care – PPO

## 2021-03-01 ENCOUNTER — Inpatient Hospital Stay: Payer: BC Managed Care – PPO | Attending: Hematology

## 2021-03-01 ENCOUNTER — Other Ambulatory Visit: Payer: BC Managed Care – PPO

## 2021-03-01 VITALS — BP 133/85 | HR 97 | Temp 99.0°F | Resp 18

## 2021-03-01 DIAGNOSIS — Z95828 Presence of other vascular implants and grafts: Secondary | ICD-10-CM

## 2021-03-01 DIAGNOSIS — C50911 Malignant neoplasm of unspecified site of right female breast: Secondary | ICD-10-CM

## 2021-03-01 DIAGNOSIS — C50311 Malignant neoplasm of lower-inner quadrant of right female breast: Secondary | ICD-10-CM | POA: Insufficient documentation

## 2021-03-01 DIAGNOSIS — C7800 Secondary malignant neoplasm of unspecified lung: Secondary | ICD-10-CM

## 2021-03-01 DIAGNOSIS — Z5112 Encounter for antineoplastic immunotherapy: Secondary | ICD-10-CM | POA: Insufficient documentation

## 2021-03-01 LAB — CBC WITH DIFFERENTIAL/PLATELET
Abs Immature Granulocytes: 0.01 10*3/uL (ref 0.00–0.07)
Basophils Absolute: 0 10*3/uL (ref 0.0–0.1)
Basophils Relative: 0 %
Eosinophils Absolute: 0.1 10*3/uL (ref 0.0–0.5)
Eosinophils Relative: 1 %
HCT: 39.8 % (ref 36.0–46.0)
Hemoglobin: 13.2 g/dL (ref 12.0–15.0)
Immature Granulocytes: 0 %
Lymphocytes Relative: 29 %
Lymphs Abs: 2.1 10*3/uL (ref 0.7–4.0)
MCH: 29.6 pg (ref 26.0–34.0)
MCHC: 33.2 g/dL (ref 30.0–36.0)
MCV: 89.2 fL (ref 80.0–100.0)
Monocytes Absolute: 0.7 10*3/uL (ref 0.1–1.0)
Monocytes Relative: 9 %
Neutro Abs: 4.4 10*3/uL (ref 1.7–7.7)
Neutrophils Relative %: 61 %
Platelets: 266 10*3/uL (ref 150–400)
RBC: 4.46 MIL/uL (ref 3.87–5.11)
RDW: 13.1 % (ref 11.5–15.5)
WBC: 7.3 10*3/uL (ref 4.0–10.5)
nRBC: 0 % (ref 0.0–0.2)

## 2021-03-01 LAB — CMP (CANCER CENTER ONLY)
ALT: 37 U/L (ref 0–44)
AST: 27 U/L (ref 15–41)
Albumin: 4.3 g/dL (ref 3.5–5.0)
Alkaline Phosphatase: 67 U/L (ref 38–126)
Anion gap: 10 (ref 5–15)
BUN: 15 mg/dL (ref 6–20)
CO2: 28 mmol/L (ref 22–32)
Calcium: 9.7 mg/dL (ref 8.9–10.3)
Chloride: 99 mmol/L (ref 98–111)
Creatinine: 0.97 mg/dL (ref 0.44–1.00)
GFR, Estimated: >60 mL/min (ref >60)
Glucose, Bld: 82 mg/dL (ref 70–99)
Potassium: 4.2 mmol/L (ref 3.5–5.1)
Sodium: 137 mmol/L (ref 135–145)
Total Bilirubin: 0.6 mg/dL (ref 0.3–1.2)
Total Protein: 8.8 g/dL — ABNORMAL HIGH (ref 6.5–8.1)

## 2021-03-01 MED ORDER — HEPARIN SOD (PORK) LOCK FLUSH 100 UNIT/ML IV SOLN
500.0000 [IU] | Freq: Once | INTRAVENOUS | Status: AC | PRN
Start: 2021-03-01 — End: 2021-03-01
  Administered 2021-03-01: 500 [IU] via INTRAVENOUS
  Filled 2021-03-01: qty 5

## 2021-03-01 MED ORDER — TRASTUZUMAB-HYALURONIDASE-OYSK 600-10000 MG-UNT/5ML ~~LOC~~ SOLN
600.0000 mg | Freq: Once | SUBCUTANEOUS | Status: AC
  Administered 2021-03-01: 600 mg via SUBCUTANEOUS
  Filled 2021-03-01: qty 5

## 2021-03-01 MED ORDER — SODIUM CHLORIDE 0.9% FLUSH
10.0000 mL | Freq: Once | INTRAVENOUS | Status: AC
  Administered 2021-03-01: 10 mL
  Filled 2021-03-01: qty 10

## 2021-03-01 NOTE — Patient Instructions (Signed)
[facility]  Discharge Instructions: Thank you for choosing [facility] to provide your oncology and hematology care.   If you have a lab appointment with the [facility], please go directly to the [facility] and check in at the registration area.   Wear comfortable clothing and clothing appropriate for easy access to any Portacath or PICC line.   We strive to give you quality time with your provider. You may need to reschedule your appointment if you arrive late (15 or more minutes).  Arriving late affects you and other patients whose appointments are after yours.  Also, if you miss three or more appointments without notifying the office, you may be dismissed from the clinic at the provider's discretion.      For prescription refill requests, have your pharmacy contact our office and allow 72 hours for refills to be completed.    Today you received the following chemotherapy and/or immunotherapy agents: Herceptin Hylecta.      To help prevent nausea and vomiting after your treatment, we encourage you to take your nausea medication as directed.  BELOW ARE SYMPTOMS THAT SHOULD BE REPORTED IMMEDIATELY: *FEVER GREATER THAN 100.4 F (38 C) OR HIGHER *CHILLS OR SWEATING *NAUSEA AND VOMITING THAT IS NOT CONTROLLED WITH YOUR NAUSEA MEDICATION *UNUSUAL SHORTNESS OF BREATH *UNUSUAL BRUISING OR BLEEDING *URINARY PROBLEMS (pain or burning when urinating, or frequent urination) *BOWEL PROBLEMS (unusual diarrhea, constipation, pain near the anus) TENDERNESS IN MOUTH AND THROAT WITH OR WITHOUT PRESENCE OF ULCERS (sore throat, sores in mouth, or a toothache) UNUSUAL RASH, SWELLING OR PAIN  UNUSUAL VAGINAL DISCHARGE OR ITCHING   Items with * indicate a potential emergency and should be followed up as soon as possible or go to the Emergency Department if any problems should occur.  Please show the CHEMOTHERAPY ALERT CARD or IMMUNOTHERAPY ALERT CARD at  check-in to the Emergency Department and triage nurse.  Should you have questions after your visit or need to cancel or reschedule your appointment, please contact [facility]  Dept: [phone]  and follow the prompts.  Office hours are 8:00 a.m. to 4:30 p.m. Monday - Friday. Please note that voicemails left after 4:00 p.m. may not be returned until the following business day.  We are closed weekends and major holidays. You have access to a nurse at all times for urgent questions. Please call the main number to the clinic Dept: [phone] and follow the prompts.   For any non-urgent questions, you may also contact your provider using MyChart. We now offer e-Visits for anyone 54 and older to request care online for non-urgent symptoms. For details visit [email or web address].   Also download the MyChart app! Go to the app store, search "MyChart", open the app, select [facility], and log in with your MyChart username and password.  Due to Covid, a mask is required upon entering the hospital/clinic. If you do not have a mask, one will be given to you upon arrival. For doctor visits, patients may have 1 support person aged 6 or older with them. For treatment visits, patients cannot have anyone with them due to current Covid guidelines and our immunocompromised population.

## 2021-03-12 ENCOUNTER — Other Ambulatory Visit (HOSPITAL_COMMUNITY): Payer: Self-pay | Admitting: Internal Medicine

## 2021-03-15 ENCOUNTER — Other Ambulatory Visit: Payer: Self-pay | Admitting: Hematology

## 2021-03-22 ENCOUNTER — Ambulatory Visit: Payer: BC Managed Care – PPO

## 2021-03-22 ENCOUNTER — Ambulatory Visit: Payer: BC Managed Care – PPO | Admitting: Hematology

## 2021-03-22 ENCOUNTER — Inpatient Hospital Stay: Payer: BC Managed Care – PPO | Attending: Hematology

## 2021-03-22 ENCOUNTER — Other Ambulatory Visit: Payer: BC Managed Care – PPO

## 2021-03-22 ENCOUNTER — Inpatient Hospital Stay: Payer: BC Managed Care – PPO

## 2021-03-22 ENCOUNTER — Other Ambulatory Visit: Payer: Self-pay

## 2021-03-22 ENCOUNTER — Inpatient Hospital Stay (HOSPITAL_BASED_OUTPATIENT_CLINIC_OR_DEPARTMENT_OTHER): Payer: BC Managed Care – PPO | Admitting: Hematology

## 2021-03-22 ENCOUNTER — Encounter: Payer: Self-pay | Admitting: Hematology

## 2021-03-22 VITALS — BP 115/69 | HR 80 | Temp 99.2°F | Resp 18 | Ht 70.0 in | Wt 230.8 lb

## 2021-03-22 DIAGNOSIS — C7800 Secondary malignant neoplasm of unspecified lung: Secondary | ICD-10-CM

## 2021-03-22 DIAGNOSIS — Z5112 Encounter for antineoplastic immunotherapy: Secondary | ICD-10-CM | POA: Insufficient documentation

## 2021-03-22 DIAGNOSIS — C50311 Malignant neoplasm of lower-inner quadrant of right female breast: Secondary | ICD-10-CM | POA: Diagnosis present

## 2021-03-22 DIAGNOSIS — C50911 Malignant neoplasm of unspecified site of right female breast: Secondary | ICD-10-CM | POA: Diagnosis not present

## 2021-03-22 DIAGNOSIS — I10 Essential (primary) hypertension: Secondary | ICD-10-CM | POA: Insufficient documentation

## 2021-03-22 DIAGNOSIS — G620 Drug-induced polyneuropathy: Secondary | ICD-10-CM | POA: Diagnosis not present

## 2021-03-22 DIAGNOSIS — Z171 Estrogen receptor negative status [ER-]: Secondary | ICD-10-CM | POA: Diagnosis not present

## 2021-03-22 DIAGNOSIS — Z95828 Presence of other vascular implants and grafts: Secondary | ICD-10-CM

## 2021-03-22 LAB — CBC WITH DIFFERENTIAL/PLATELET
Abs Immature Granulocytes: 0.01 10*3/uL (ref 0.00–0.07)
Basophils Absolute: 0 10*3/uL (ref 0.0–0.1)
Basophils Relative: 1 %
Eosinophils Absolute: 0.1 10*3/uL (ref 0.0–0.5)
Eosinophils Relative: 1 %
HCT: 35.5 % — ABNORMAL LOW (ref 36.0–46.0)
Hemoglobin: 11.8 g/dL — ABNORMAL LOW (ref 12.0–15.0)
Immature Granulocytes: 0 %
Lymphocytes Relative: 31 %
Lymphs Abs: 2 10*3/uL (ref 0.7–4.0)
MCH: 29.8 pg (ref 26.0–34.0)
MCHC: 33.2 g/dL (ref 30.0–36.0)
MCV: 89.6 fL (ref 80.0–100.0)
Monocytes Absolute: 0.5 10*3/uL (ref 0.1–1.0)
Monocytes Relative: 9 %
Neutro Abs: 3.7 10*3/uL (ref 1.7–7.7)
Neutrophils Relative %: 58 %
Platelets: 226 10*3/uL (ref 150–400)
RBC: 3.96 MIL/uL (ref 3.87–5.11)
RDW: 13.3 % (ref 11.5–15.5)
WBC: 6.4 10*3/uL (ref 4.0–10.5)
nRBC: 0 % (ref 0.0–0.2)

## 2021-03-22 LAB — COMPREHENSIVE METABOLIC PANEL
ALT: 18 U/L (ref 0–44)
AST: 20 U/L (ref 15–41)
Albumin: 3.6 g/dL (ref 3.5–5.0)
Alkaline Phosphatase: 70 U/L (ref 38–126)
Anion gap: 8 (ref 5–15)
BUN: 14 mg/dL (ref 6–20)
CO2: 26 mmol/L (ref 22–32)
Calcium: 9.4 mg/dL (ref 8.9–10.3)
Chloride: 105 mmol/L (ref 98–111)
Creatinine, Ser: 0.86 mg/dL (ref 0.44–1.00)
GFR, Estimated: >60 mL/min (ref >60)
Glucose, Bld: 114 mg/dL — ABNORMAL HIGH (ref 70–99)
Potassium: 3.9 mmol/L (ref 3.5–5.1)
Sodium: 139 mmol/L (ref 135–145)
Total Bilirubin: 0.5 mg/dL (ref 0.3–1.2)
Total Protein: 7.6 g/dL (ref 6.5–8.1)

## 2021-03-22 MED ORDER — SODIUM CHLORIDE 0.9% FLUSH
10.0000 mL | Freq: Once | INTRAVENOUS | Status: AC
Start: 2021-03-22 — End: 2021-03-22
  Administered 2021-03-22: 10 mL
  Filled 2021-03-22: qty 10

## 2021-03-22 MED ORDER — HEPARIN SOD (PORK) LOCK FLUSH 100 UNIT/ML IV SOLN
500.0000 [IU] | Freq: Once | INTRAVENOUS | Status: AC | PRN
Start: 2021-03-22 — End: 2021-03-22
  Administered 2021-03-22: 500 [IU] via INTRAVENOUS
  Filled 2021-03-22: qty 5

## 2021-03-22 MED ORDER — TRASTUZUMAB-HYALURONIDASE-OYSK 600-10000 MG-UNT/5ML ~~LOC~~ SOLN
600.0000 mg | Freq: Once | SUBCUTANEOUS | Status: AC
  Administered 2021-03-22: 600 mg via SUBCUTANEOUS
  Filled 2021-03-22: qty 5

## 2021-03-22 NOTE — Progress Notes (Signed)
[facility]   [phone] [phone]   Clinic Follow up Note   Patient Care Team: Virginia Rochester, [place] as PCP - General (Family Medicine) [name], DO (Family Medicine) Stark Klein, MD as Consulting Physician (General Surgery) [name], MD as Consulting Physician (Hematology) Clydie Braun, MD (Obstetrics and Gynecology) Haroldine Laws, Shaune Pascal, MD as Consulting Physician (Cardiology)  Date of Service:  03/22/2021  CHIEF COMPLAINT: f/u of right breast cancer  SUMMARY OF ONCOLOGIC HISTORY: Oncology History Overview Note  Breast cancer 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   Staging form: Breast, AJCC 7th Edition     Clinical stage from 07/22/2014: Stage IV (T3, N1, M1) - Unsigned      Breast cancer 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 ([place])  07/02/2014 Mammogram   Mammogram showed a 2cm right beast mass and a 1.8cm right axillary node. MRI breast on 07/16/2014 showed 7cm R breast lesion and 4.4cm r axillary node     07/02/2014 Imaging   CT CAP: a 4.7cm mass in LUL lung and a 2.1cm mas in RML, and a small nodule in RUL, suspecious for metastasis      07/09/2014 Initial Diagnosis   right IDA with b/l lung lesions, ER-/PR-/HER2+    07/09/2014 Initial Biopsy   US guided right breast mass and axillary node biopsy showed IDA, and DCIS, ER-/PR-/HER2+    07/26/2014 Pathologic Stage   Left lung mass by IR, path revealed high grade carcinoma, morphology similar to breast tumor biopsy, TTF(-), NapsinA(-), ER(-)    08/04/2014 - 03/22/2015 Chemotherapy   weekly Paclitaxel 61m/m2, trastuzumab and pertuzumab every 3 weeks    08/30/2014 Genetic Testing   BreastNext panel was negative. 17 genes including BRCA1, BRCA2, were negative for mutations.    10/04/2014 Imaging   Interval decrease in the right axillary lymphadenopathy. Bilateral pulmonary lesions with left hilar lymphadenopathy also markedly decreased in the interval. The left hilar lymphadenopathy has resolved.     12/20/2014 Imaging   restaging CT showed stable disease, no new lesions     03/29/2015 Pathology Results    right breast lumpectomy showed  chemotherapy treatment effect,  a 1 mm residual tumor,   margins were widely negative, 5 sentinel lymph nodes and 2 axillary lymph nodes were negative.     03/29/2015 Surgery    right breast lumpectomy and sentinel lymph node biopsy  by Dr. BBarry Dienes   04/12/2015 -  Chemotherapy   Herceptin maintenance therapy , 6 mg/kg, every 3 weeks since 04/12/15. Switched to Herceptin Hylecta injection q3weeks after 06/10/19.   05/03/2015 - 06/14/2015 Radiation Therapy   right breast adjuvant irradiation by Dr. MValere Dross    08/28/2016 Imaging   CT CAP w Contrast  IMPRESSION: 1. No acute process or evidence of metastatic disease within the chest, abdomen, or pelvis. 2. Similar to less well-defined left upper lobe density, likely scarring. No evidence of new or progressive pulmonary metastasis. 3. Right nephrolithiasis.    01/22/2017 Imaging   CT Chest W Contrast 01/22/17 IMPRESSION: 1. Stable exam. No new or progressive findings. No evidence for metastatic disease. 2. Left upper lobe architectural distortion/scarring is stable. 3. Nonobstructing right renal stone.    07/31/2017 Imaging   Bone Scan Whole Body 07/31/17  IMPRESSION: 1. No scintigraphic evidence of osseous metastatic disease. 2. Thoracolumbar scoliosis.     07/31/2017 Imaging   CT CAP W Contrast 07/31/17 IMPRESSION: 1. No findings of active or recurrent malignancy. 2. Other imaging findings of potential clinical significance: Aortic Atherosclerosis (ICD10-I70.0). Mild  cardiomegaly. Postoperative and radiation therapy findings in the right chest. Nonobstructive right nephrolithiasis. Thoracolumbar scoliosis.      01/20/2018 Echocardiogram   ECHO 01/20/2018 Study Conclusions   - Left ventricle: The cavity size was normal. Wall thickness was   normal. Systolic function was normal. The  estimated ejection   fraction was in the range of 55% to 60%. Wall motion was normal;   there were no regional wall motion abnormalities. Doppler   parameters are consistent with abnormal left ventricular   relaxation (grade 1 diastolic dysfunction). - Impressions: GLS -20.7% LS&' 10.4 cm.     02/17/2018 Imaging   02/17/2018 CT CAP IMPRESSION: 1. No evidence for residual or recurrent tumor or metastatic disease. 2.  Aortic Atherosclerosis (ICD10-I70.0). 3. Right renal calculi. 4. Scoliosis.    08/11/2018 Imaging   CT CAP W Contrast 08/11/18  IMPRESSION: No evidence for localized recurrence or metastatic disease in the chest, abdomen or pelvis.    02/23/2019 Imaging   CT CAP W Contrast  IMPRESSION: 1. No findings of active malignancy in the chest, abdomen, or pelvis. 2. 3 mm right kidney lower pole nonobstructive renal calculus. 3. Thoracolumbar scoliosis.   12/08/2019 Imaging   CT CAP W contrast  IMPRESSION: 1. Unchanged post treatment appearance of the lungs. No evidence of recurrent or new metastatic disease in the chest, abdomen, or pelvis.   2. Postoperative findings of right lumpectomy and axillary lymph node dissection.   3.  Nonobstructive right nephrolithiasis.   11/17/2020 Imaging   CT CAP IMPRESSION: 1. No evidence of recurrence or new metastatic disease within the chest, abdomen, or pelvis. 2. Similar post treatment changes in the lungs and postoperative findings of right lumpectomy and axillary lymph node dissection. 3. Colonic diverticulosis without findings of acute diverticulitis.      CURRENT THERAPY:  Maintenance Herceptin every 3 weeks since 04/12/15. Switched to Herceptin Hylecta injection q3weeks after 06/10/19.  INTERVAL HISTORY:  [name] [name] is here for a follow up of breast cancer. She was last seen by me on 11/24/20. She presents to the clinic alone. She reports doing well overall. She notes she continues to tolerate injections well  without significant side effects. She reports ongoing, irregular periods. She notes she will go months at a time without a period. She notes her most recent was in 12/2020. She notes both her children will be transferring to [employer] this fall. They begin class next week. She notes she is still taking the gabapentin but only as needed. "Sometimes I take it 2-3 nights in a row then sometimes I don't take it for another 4 months."   All other systems were reviewed with the patient and are negative.  MEDICAL HISTORY:  Past Medical History:  Diagnosis Date   Breast cancer ([facility])    Breast cancer ([facility])    CHF (congestive heart failure) (H[facility])    GERD (gastroesophageal reflux disease)    during pregnancy    Pneumonia    hx of pneumonia 08/2013    S/P radiation therapy 05/03/2015 through 06/14/2015                                                      Right breast 4680 cGy in 26 sessions, right breast boost 1000 cGy in 5 sessions.  Right supraclavicular/axillary region 4680 cGy with a supplemental  PA field to bring the axillary dose up to 4500 cGy in 26 sessions    SURGICAL HISTORY: Past Surgical History:  Procedure Laterality Date   BREAST LUMPECTOMY WITH RADIOACTIVE SEED AND SENTINEL LYMPH NODE BIOPSY Right 03/29/2015   Procedure: BREAST LUMPECTOMY WITH RADIOACTIVE SEED AND SENTINEL LYMPH NODE BIOPSY;  Surgeon: Stark Klein, MD;  Location: [facility];  Service: General;  Laterality: Right;   CESAREAN SECTION     CHOLECYSTECTOMY     ESSURE TUBAL LIGATION     PORTACATH PLACEMENT Left 07/21/2014   Procedure: INSERTION PORT-A-CATH;  Surgeon: Stark Klein, MD;  Location: [facility];  Service: General;  Laterality: Left;   WISDOM TOOTH EXTRACTION      I have reviewed the social history and family history with the patient and they are unchanged from previous note.  ALLERGIES:  is allergic to adhesive [tape], aspirin, and codeine.  MEDICATIONS:  Current Outpatient Medications  Medication  Sig Dispense Refill   acetaminophen (TYLENOL) 500 MG tablet Take 500 mg by mouth every 6 (six) hours as needed.     Ascorbic Acid (VITAMIN C) 500 MG CHEW      Calcium-Phosphorus-Vitamin D (CALCIUM GUMMIES PO) Take by mouth daily.     Camphor-Eucalyptus-Menthol (VICKS VAPORUB EX) Apply 1 application topically at bedtime. Applies under nose, throat and on chest.     cetirizine (ZYRTEC) 10 MG tablet Take 10 mg by mouth daily as needed for allergies (allergies).      Fluocinolone Acetonide Body 0.01 % OIL Apply topically 4 (four) times a week.     fluticasone (FLONASE) 50 MCG/ACT nasal spray Place into both nostrils daily.     gabapentin (NEURONTIN) 100 MG capsule TAKE 2 CAPSULES(200 MG) BY MOUTH AT BEDTIME 60 capsule 3   hydrocortisone 2.5 % cream Apply 1 application topically 2 (two) times daily as needed.     ibuprofen (ADVIL,MOTRIN) 800 MG tablet Take 1 tablet (800 mg total) by mouth every 8 (eight) hours as needed. 60 tablet 1   lidocaine-prilocaine (EMLA) cream Apply as directed prior to chemotherapy 30 g 2   loperamide (IMODIUM A-D) 2 MG tablet Take 1 tablet (2 mg total) by mouth 4 (four) times daily as needed for diarrhea or loose stools. 30 tablet 0   losartan (COZAAR) 25 MG tablet TAKE 1 TABLET(25 MG) BY MOUTH DAILY 90 tablet 3   minoxidil (LONITEN) 2.5 MG tablet Take 2.5 mg by mouth daily. 1/2 tab once daily     Multiple Vitamins-Minerals (MULTIVITAMIN GUMMIES ADULT PO) Take 1 each by mouth daily. Women's Vitafusion gummie     spironolactone (ALDACTONE) 25 MG tablet TAKE 1 TABLET BY MOUTH DAILY 90 tablet 3   No current facility-administered medications for this visit.   Facility-Administered Medications Ordered in Other Visits  Medication Dose Route Frequency Provider Last Rate Last Admin   acetaminophen (TYLENOL) tablet 650 mg  650 mg Oral Once [name], MD       heparin lock flush 100 unit/mL  500 Units Intracatheter Once PRN [name], MD       sodium chloride 0.9 % injection 10 mL   10 mL Intracatheter PRN [name], MD   10 mL at 05/01/16 1622   sodium chloride 0.9 % injection 10 mL  10 mL Intravenous PRN [name], MD       sodium chloride 0.9 % injection 10 mL  10 mL Intracatheter PRN [name], MD   10 mL at 03/18/19 1715   sodium chloride  flush (NS) 0.9 % injection 10 mL  10 mL Intravenous PRN [name], MD        PHYSICAL EXAMINATION: ECOG PERFORMANCE STATUS: 0 - Asymptomatic  Vitals:   03/22/21 1325  BP: 115/69  Pulse: 80  Resp: 18  Temp: 99.2 F (37.3 C)  SpO2: 99%   Wt Readings from Last 3 Encounters:  03/22/21 230 lb 12.8 oz (104.7 kg)  01/18/21 230 lb (104.3 kg)  11/24/20 231 lb 14.4 oz (105.2 kg)     GENERAL:alert, no distress and comfortable SKIN: skin color, texture, turgor are normal, no rashes or significant lesions EYES: normal, Conjunctiva are pink and non-injected, sclera clear  NECK: supple, thyroid normal size, non-tender, without nodularity LYMPH:  no palpable lymphadenopathy in the cervical, axillary  LUNGS: clear to auscultation and percussion with normal breathing effort HEART: regular rate & rhythm and no murmurs and no lower extremity edema ABDOMEN:abdomen soft, non-tender and normal bowel sounds Musculoskeletal:no cyanosis of digits and no clubbing  NEURO: alert & oriented x 3 with fluent speech, no focal motor/sensory deficits BREAST: No palpable mass, nodules or adenopathy bilaterally. Breast exam benign.   LABORATORY DATA:  I have reviewed the data as listed CBC Latest Ref Rng & Units 03/22/2021 03/01/2021 02/08/2021  WBC 4.0 - 10.5 K/uL 6.4 7.3 6.4  Hemoglobin 12.0 - 15.0 g/dL 11.8(L) 13.2 12.1  Hematocrit 36.0 - 46.0 % 35.5(L) 39.8 35.8(L)  Platelets 150 - 400 K/uL 226 266 208     CMP Latest Ref Rng & Units 03/22/2021 03/01/2021 02/08/2021  Glucose 70 - 99 mg/dL 114(H) 82 103(H)  BUN 6 - 20 mg/dL _0 Creatinine 0.44 - 1.00 mg/dL 0.86 0.97 1.20(H)  Sodium 135 - 145 mmol/L 139 137 142  Potassium 3.5 - 5.1 mmol/L  3.9 4.2 3.8  Chloride 98 - 111 mmol/L 105 99 104  CO2 22 - 32 mmol/L _1 Calcium 8.9 - 10.3 mg/dL 9.4 9.7 9.0  Total Protein 6.5 - 8.1 g/dL 7.6 8.8(H) 7.5  Total Bilirubin 0.3 - 1.2 mg/dL 0.5 0.6 0.4  Alkaline Phos 38 - 126 U/L 70 67 70  AST 15 - 41 U/L _2 ALT 0 - 44 U/L 18 37 26      RADIOGRAPHIC STUDIES: I have personally reviewed the radiological images as listed and agreed with the findings in the report. No results found.   ASSESSMENT & PLAN:  [name] [name] is a 50 y.o. female with   1. 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 Switched to Herceptin Hylecta injection q3weeks after 06/10/19. Tolerating well.  -She underwent hysteroscopy with D&C on 11/27/18 to remove benign polyps that were recently found. -Her most recent CT CAP from 11/17/20 showed stable post treatment appearance of the lungs. No evidence of recurrence or new metastatic disease. Plan for next restaging scan in a year. -[facility] [place] [facility] She reports she is up to date. -She is clinically doing well and stable. She will proceed with Herceptin today and continue every 3 weeks. -We can consider stopping her Herceptin if she remains NED after 10 years. -Continue with labs and port flush every 12 weeks   2. HTN -On losartan giving by Dr. BHaroldine Lawsin June 2019, dose increased in 08/2018. She sees him every 6 months. 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      Plan -proceed with Herceptin injection today and every 3 weeks -I encouraged her to get her second Covid booster and the flu shot -Port Flush every 6 weeks  -Labs every 12 weeks -f/u in 4  months    No problem-specific Assessment & Plan notes found for this encounter.   No orders of the defined types were placed in this encounter.  All questions were answered. The patient knows to call the clinic with any problems, questions or concerns. No barriers to learning was detected. The total time spent in the appointment was 20 minutes.     YTruitt Merle MD 03/22/2021   I, KWilburn Mylar am acting as scribe for YTruitt Merle MD.   I have reviewed the above documentation for accuracy and completeness, and I agree with the above.

## 2021-03-22 NOTE — Patient Instructions (Signed)
[facility]  Discharge Instructions: Thank you for choosing [facility] to provide your oncology and hematology care.   If you have a lab appointment with the [facility], please go directly to the [facility] and check in at the registration area.   Wear comfortable clothing and clothing appropriate for easy access to any Portacath or PICC line.   We strive to give you quality time with your provider. You may need to reschedule your appointment if you arrive late (15 or more minutes).  Arriving late affects you and other patients whose appointments are after yours.  Also, if you miss three or more appointments without notifying the office, you may be dismissed from the clinic at the provider's discretion.      For prescription refill requests, have your pharmacy contact our office and allow 72 hours for refills to be completed.    Today you received the following chemotherapy and/or immunotherapy agents : Herceptin Hylecta    To help prevent nausea and vomiting after your treatment, we encourage you to take your nausea medication as directed.  BELOW ARE SYMPTOMS THAT SHOULD BE REPORTED IMMEDIATELY: *FEVER GREATER THAN 100.4 F (38 C) OR HIGHER *CHILLS OR SWEATING *NAUSEA AND VOMITING THAT IS NOT CONTROLLED WITH YOUR NAUSEA MEDICATION *UNUSUAL SHORTNESS OF BREATH *UNUSUAL BRUISING OR BLEEDING *URINARY PROBLEMS (pain or burning when urinating, or frequent urination) *BOWEL PROBLEMS (unusual diarrhea, constipation, pain near the anus) TENDERNESS IN MOUTH AND THROAT WITH OR WITHOUT PRESENCE OF ULCERS (sore throat, sores in mouth, or a toothache) UNUSUAL RASH, SWELLING OR PAIN  UNUSUAL VAGINAL DISCHARGE OR ITCHING   Items with * indicate a potential emergency and should be followed up as soon as possible or go to the Emergency Department if any problems should occur.  Please show the CHEMOTHERAPY ALERT CARD or IMMUNOTHERAPY ALERT CARD at  check-in to the Emergency Department and triage nurse.  Should you have questions after your visit or need to cancel or reschedule your appointment, please contact [facility]  Dept: [phone]  and follow the prompts.  Office hours are 8:00 a.m. to 4:30 p.m. Monday - Friday. Please note that voicemails left after 4:00 p.m. may not be returned until the following business day.  We are closed weekends and major holidays. You have access to a nurse at all times for urgent questions. Please call the main number to the clinic Dept: [phone] and follow the prompts.   For any non-urgent questions, you may also contact your provider using MyChart. We now offer e-Visits for anyone 54 and older to request care online for non-urgent symptoms. For details visit [email or web address].   Also download the MyChart app! Go to the app store, search "MyChart", open the app, select [facility], and log in with your MyChart username and password.  Due to Covid, a mask is required upon entering the hospital/clinic. If you do not have a mask, one will be given to you upon arrival. For doctor visits, patients may have 1 support person aged 40 or older with them. For treatment visits, patients cannot have anyone with them due to current Covid guidelines and our immunocompromised population.

## 2021-03-23 ENCOUNTER — Telehealth: Payer: Self-pay | Admitting: Hematology

## 2021-03-23 NOTE — Telephone Encounter (Signed)
Scheduled follow-up appointments per 8/10 los. Patient is aware.

## 2021-04-12 ENCOUNTER — Inpatient Hospital Stay: Payer: BC Managed Care – PPO

## 2021-04-12 ENCOUNTER — Other Ambulatory Visit: Payer: Self-pay

## 2021-04-12 VITALS — BP 143/78 | HR 98 | Temp 98.2°F | Resp 18 | Wt 226.5 lb

## 2021-04-12 DIAGNOSIS — Z5112 Encounter for antineoplastic immunotherapy: Secondary | ICD-10-CM | POA: Diagnosis not present

## 2021-04-12 DIAGNOSIS — C50911 Malignant neoplasm of unspecified site of right female breast: Secondary | ICD-10-CM

## 2021-04-12 MED ORDER — TRASTUZUMAB-HYALURONIDASE-OYSK 600-10000 MG-UNT/5ML ~~LOC~~ SOLN
600.0000 mg | Freq: Once | SUBCUTANEOUS | Status: AC
  Administered 2021-04-12: 600 mg via SUBCUTANEOUS
  Filled 2021-04-12: qty 5

## 2021-04-12 NOTE — Patient Instructions (Signed)
[facility]  Discharge Instructions: Thank you for choosing [facility] to provide your oncology and hematology care.   If you have a lab appointment with the [facility], please go directly to the [facility] and check in at the registration area.   Wear comfortable clothing and clothing appropriate for easy access to any Portacath or PICC line.   We strive to give you quality time with your provider. You may need to reschedule your appointment if you arrive late (15 or more minutes).  Arriving late affects you and other patients whose appointments are after yours.  Also, if you miss three or more appointments without notifying the office, you may be dismissed from the clinic at the provider's discretion.      For prescription refill requests, have your pharmacy contact our office and allow 72 hours for refills to be completed.    Today you received the following chemotherapy and/or immunotherapy agents Herceptin Hylecta      To help prevent nausea and vomiting after your treatment, we encourage you to take your nausea medication as directed.  BELOW ARE SYMPTOMS THAT SHOULD BE REPORTED IMMEDIATELY: *FEVER GREATER THAN 100.4 F (38 C) OR HIGHER *CHILLS OR SWEATING *NAUSEA AND VOMITING THAT IS NOT CONTROLLED WITH YOUR NAUSEA MEDICATION *UNUSUAL SHORTNESS OF BREATH *UNUSUAL BRUISING OR BLEEDING *URINARY PROBLEMS (pain or burning when urinating, or frequent urination) *BOWEL PROBLEMS (unusual diarrhea, constipation, pain near the anus) TENDERNESS IN MOUTH AND THROAT WITH OR WITHOUT PRESENCE OF ULCERS (sore throat, sores in mouth, or a toothache) UNUSUAL RASH, SWELLING OR PAIN  UNUSUAL VAGINAL DISCHARGE OR ITCHING   Items with * indicate a potential emergency and should be followed up as soon as possible or go to the Emergency Department if any problems should occur.  Please show the CHEMOTHERAPY ALERT CARD or IMMUNOTHERAPY ALERT CARD at  check-in to the Emergency Department and triage nurse.  Should you have questions after your visit or need to cancel or reschedule your appointment, please contact [facility]  Dept: [phone]  and follow the prompts.  Office hours are 8:00 a.m. to 4:30 p.m. Monday - Friday. Please note that voicemails left after 4:00 p.m. may not be returned until the following business day.  We are closed weekends and major holidays. You have access to a nurse at all times for urgent questions. Please call the main number to the clinic Dept: [phone] and follow the prompts.   For any non-urgent questions, you may also contact your provider using MyChart. We now offer e-Visits for anyone 23 and older to request care online for non-urgent symptoms. For details visit [email or web address].   Also download the MyChart app! Go to the app store, search "MyChart", open the app, select [facility], and log in with your MyChart username and password.  Due to Covid, a mask is required upon entering the hospital/clinic. If you do not have a mask, one will be given to you upon arrival. For doctor visits, patients may have 1 support person aged 59 or older with them. For treatment visits, patients cannot have anyone with them due to current Covid guidelines and our immunocompromised population.

## 2021-05-03 ENCOUNTER — Inpatient Hospital Stay: Payer: BC Managed Care – PPO

## 2021-05-03 ENCOUNTER — Inpatient Hospital Stay: Payer: BC Managed Care – PPO | Attending: Hematology

## 2021-05-03 ENCOUNTER — Other Ambulatory Visit: Payer: Self-pay

## 2021-05-03 VITALS — BP 148/78 | HR 87 | Temp 98.5°F | Resp 18 | Wt 228.5 lb

## 2021-05-03 DIAGNOSIS — C7800 Secondary malignant neoplasm of unspecified lung: Secondary | ICD-10-CM

## 2021-05-03 DIAGNOSIS — Z95828 Presence of other vascular implants and grafts: Secondary | ICD-10-CM

## 2021-05-03 DIAGNOSIS — C50311 Malignant neoplasm of lower-inner quadrant of right female breast: Secondary | ICD-10-CM | POA: Insufficient documentation

## 2021-05-03 DIAGNOSIS — Z5112 Encounter for antineoplastic immunotherapy: Secondary | ICD-10-CM | POA: Insufficient documentation

## 2021-05-03 DIAGNOSIS — C50911 Malignant neoplasm of unspecified site of right female breast: Secondary | ICD-10-CM

## 2021-05-03 LAB — CBC WITH DIFFERENTIAL/PLATELET
Abs Immature Granulocytes: 0.02 10*3/uL (ref 0.00–0.07)
Basophils Absolute: 0 10*3/uL (ref 0.0–0.1)
Basophils Relative: 0 %
Eosinophils Absolute: 0.1 10*3/uL (ref 0.0–0.5)
Eosinophils Relative: 2 %
HCT: 35 % — ABNORMAL LOW (ref 36.0–46.0)
Hemoglobin: 11.7 g/dL — ABNORMAL LOW (ref 12.0–15.0)
Immature Granulocytes: 0 %
Lymphocytes Relative: 27 %
Lymphs Abs: 1.8 10*3/uL (ref 0.7–4.0)
MCH: 29.9 pg (ref 26.0–34.0)
MCHC: 33.4 g/dL (ref 30.0–36.0)
MCV: 89.5 fL (ref 80.0–100.0)
Monocytes Absolute: 0.6 10*3/uL (ref 0.1–1.0)
Monocytes Relative: 9 %
Neutro Abs: 4.2 10*3/uL (ref 1.7–7.7)
Neutrophils Relative %: 62 %
Platelets: 237 10*3/uL (ref 150–400)
RBC: 3.91 MIL/uL (ref 3.87–5.11)
RDW: 13.1 % (ref 11.5–15.5)
WBC: 6.7 10*3/uL (ref 4.0–10.5)
nRBC: 0 % (ref 0.0–0.2)

## 2021-05-03 LAB — COMPREHENSIVE METABOLIC PANEL
ALT: 24 U/L (ref 0–44)
AST: 19 U/L (ref 15–41)
Albumin: 3.8 g/dL (ref 3.5–5.0)
Alkaline Phosphatase: 72 U/L (ref 38–126)
Anion gap: 9 (ref 5–15)
BUN: 15 mg/dL (ref 6–20)
CO2: 28 mmol/L (ref 22–32)
Calcium: 9.4 mg/dL (ref 8.9–10.3)
Chloride: 104 mmol/L (ref 98–111)
Creatinine, Ser: 0.95 mg/dL (ref 0.44–1.00)
GFR, Estimated: >60 mL/min (ref >60)
Glucose, Bld: 105 mg/dL — ABNORMAL HIGH (ref 70–99)
Potassium: 3.8 mmol/L (ref 3.5–5.1)
Sodium: 141 mmol/L (ref 135–145)
Total Bilirubin: 0.4 mg/dL (ref 0.3–1.2)
Total Protein: 7.9 g/dL (ref 6.5–8.1)

## 2021-05-03 MED ORDER — TRASTUZUMAB-HYALURONIDASE-OYSK 600-10000 MG-UNT/5ML ~~LOC~~ SOLN
600.0000 mg | Freq: Once | SUBCUTANEOUS | Status: AC
  Administered 2021-05-03: 600 mg via SUBCUTANEOUS
  Filled 2021-05-03: qty 5

## 2021-05-03 MED ORDER — SODIUM CHLORIDE 0.9% FLUSH
10.0000 mL | Freq: Once | INTRAVENOUS | Status: AC
  Administered 2021-05-03: 10 mL

## 2021-05-03 MED ORDER — HEPARIN SOD (PORK) LOCK FLUSH 100 UNIT/ML IV SOLN
500.0000 [IU] | Freq: Once | INTRAVENOUS | Status: AC | PRN
  Administered 2021-05-03: 500 [IU] via INTRAVENOUS

## 2021-05-03 NOTE — Patient Instructions (Signed)
[facility]   Discharge Instructions: Thank you for choosing [facility] to provide your oncology and hematology care.   If you have a lab appointment with the [name], please go directly to the [facility] and check in at the registration area.   Wear comfortable clothing and clothing appropriate for easy access to any Portacath or PICC line.   We strive to give you quality time with your provider. You may need to reschedule your appointment if you arrive late (15 or more minutes).  Arriving late affects you and other patients whose appointments are after yours.  Also, if you miss three or more appointments without notifying the office, you may be dismissed from the clinic at the provider's discretion.      For prescription refill requests, have your pharmacy contact our office and allow 72 hours for refills to be completed.    Today you received the following chemotherapy and/or immunotherapy agents: Herceptin Hylecenta      To help prevent nausea and vomiting after your treatment, we encourage you to take your nausea medication as directed.  BELOW ARE SYMPTOMS THAT SHOULD BE REPORTED IMMEDIATELY: *FEVER GREATER THAN 100.4 F (38 C) OR HIGHER *CHILLS OR SWEATING *NAUSEA AND VOMITING THAT IS NOT CONTROLLED WITH YOUR NAUSEA MEDICATION *UNUSUAL SHORTNESS OF BREATH *UNUSUAL BRUISING OR BLEEDING *URINARY PROBLEMS (pain or burning when urinating, or frequent urination) *BOWEL PROBLEMS (unusual diarrhea, constipation, pain near the anus) TENDERNESS IN MOUTH AND THROAT WITH OR WITHOUT PRESENCE OF ULCERS (sore throat, sores in mouth, or a toothache) UNUSUAL RASH, SWELLING OR PAIN  UNUSUAL VAGINAL DISCHARGE OR ITCHING   Items with * indicate a potential emergency and should be followed up as soon as possible or go to the Emergency Department if any problems should occur.  Please show the CHEMOTHERAPY ALERT CARD or IMMUNOTHERAPY ALERT CARD at  check-in to the Emergency Department and triage nurse.  Should you have questions after your visit or need to cancel or reschedule your appointment, please contact [facility]  Dept: [phone]  and follow the prompts.  Office hours are 8:00 a.m. to 4:30 p.m. Monday - Friday. Please note that voicemails left after 4:00 p.m. may not be returned until the following business day.  We are closed weekends and major holidays. You have access to a nurse at all times for urgent questions. Please call the main number to the clinic Dept: [phone] and follow the prompts.   For any non-urgent questions, you may also contact your provider using MyChart. We now offer e-Visits for anyone 60 and older to request care online for non-urgent symptoms. For details visit [email or web address].   Also download the MyChart app! Go to the app store, search "MyChart", open the app, select [facility], and log in with your MyChart username and password.  Due to Covid, a mask is required upon entering the hospital/clinic. If you do not have a mask, one will be given to you upon arrival. For doctor visits, patients may have 1 support person aged 73 or older with them. For treatment visits, patients cannot have anyone with them due to current Covid guidelines and our immunocompromised population.

## 2021-05-19 ENCOUNTER — Other Ambulatory Visit: Payer: Self-pay

## 2021-05-19 ENCOUNTER — Ambulatory Visit (INDEPENDENT_AMBULATORY_CARE_PROVIDER_SITE_OTHER)
Admission: RE | Admit: 2021-05-19 | Discharge: 2021-05-19 | Disposition: A | Discharge status: Home / Self Care | Payer: BC Managed Care – PPO | Source: Ambulatory Visit | Attending: Internal Medicine | Admitting: Internal Medicine

## 2021-05-19 ENCOUNTER — Ambulatory Visit (HOSPITAL_COMMUNITY)
Admission: RE | Admit: 2021-05-19 | Discharge: 2021-05-19 | Disposition: A | Discharge status: Home / Self Care | Payer: BC Managed Care – PPO | Source: Ambulatory Visit | Attending: Internal Medicine | Admitting: Internal Medicine

## 2021-05-19 ENCOUNTER — Encounter (HOSPITAL_COMMUNITY): Payer: Self-pay | Admitting: Internal Medicine

## 2021-05-19 VITALS — BP 130/80 | HR 68 | Wt 231.2 lb

## 2021-05-19 DIAGNOSIS — Z79899 Other long term (current) drug therapy: Secondary | ICD-10-CM | POA: Diagnosis not present

## 2021-05-19 DIAGNOSIS — C50919 Malignant neoplasm of unspecified site of unspecified female breast: Secondary | ICD-10-CM

## 2021-05-19 DIAGNOSIS — Z886 Allergy status to analgesic agent status: Secondary | ICD-10-CM | POA: Diagnosis not present

## 2021-05-19 DIAGNOSIS — Z888 Allergy status to other drugs, medicaments and biological substances status: Secondary | ICD-10-CM | POA: Insufficient documentation

## 2021-05-19 DIAGNOSIS — C7800 Secondary malignant neoplasm of unspecified lung: Secondary | ICD-10-CM | POA: Diagnosis present

## 2021-05-19 DIAGNOSIS — Z01810 Encounter for preprocedural cardiovascular examination: Secondary | ICD-10-CM | POA: Insufficient documentation

## 2021-05-19 DIAGNOSIS — I509 Heart failure, unspecified: Secondary | ICD-10-CM | POA: Insufficient documentation

## 2021-05-19 DIAGNOSIS — Z171 Estrogen receptor negative status [ER-]: Secondary | ICD-10-CM | POA: Insufficient documentation

## 2021-05-19 DIAGNOSIS — Z9049 Acquired absence of other specified parts of digestive tract: Secondary | ICD-10-CM | POA: Diagnosis not present

## 2021-05-19 DIAGNOSIS — I10 Essential (primary) hypertension: Secondary | ICD-10-CM

## 2021-05-19 DIAGNOSIS — Z803 Family history of malignant neoplasm of breast: Secondary | ICD-10-CM | POA: Diagnosis not present

## 2021-05-19 DIAGNOSIS — Z0189 Encounter for other specified special examinations: Secondary | ICD-10-CM | POA: Diagnosis not present

## 2021-05-19 DIAGNOSIS — Z885 Allergy status to narcotic agent status: Secondary | ICD-10-CM | POA: Insufficient documentation

## 2021-05-19 DIAGNOSIS — I110 Hypertensive heart disease with heart failure: Secondary | ICD-10-CM | POA: Diagnosis not present

## 2021-05-19 LAB — ECHOCARDIOGRAM COMPLETE
Area-P 1/2: 3.17 cm2
Calc EF: 67 %
S' Lateral: 2.9 cm
Single Plane A2C EF: 69.2 %
Single Plane A4C EF: 64.8 %

## 2021-05-19 NOTE — Progress Notes (Signed)
  Echocardiogram 2D Echocardiogram has been performed.  [name] [name] 05/19/2021, 2:43 PM

## 2021-05-19 NOTE — Patient Instructions (Signed)
Your physician recommends that you schedule a follow-up appointment in: 6 months with an echocardiogram

## 2021-05-19 NOTE — Progress Notes (Signed)
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 Mosetta Putt for enrollment into the cardio-oncology clinic for surveillance while receiving Herceptin (started 04/12/15 and on indefinitiely) who presents via audio/video conferencing for a telehealth visit today.      Continues to receive herceptin for metastatic breast CA (has been receiving Herceptin since 8/16. Last CT scan on 12/08/19 showed lungs remain clear). Works for [employer] for [employer] with [employer]. Doing well. No SOB, orthopnea or PND. No edema. BP continues to run a bit high.   Echo today EF 60-65% RV ok. GLS -24.9% Personally reviewed   Echo 04/11/20 EF 60-65% GLS -22.5%     Studies:  Echo 09/21/19 EF 60-65% GLS -21.9% Echo 03/02/19  EF 55-60% GLS -18.2%  Echo 12/19 60-65% GLS -22.2%  Echo 01/20/18. EF 55-60% LS' 10.4 cm/s GLS -20.7%  Echo 06/20/17 EF 55-60% LS' not measured will  GLS -19.1    Past Medical History:  Diagnosis Date   Breast cancer (HCC)    Breast cancer (HCC)    CHF (congestive heart failure) (HCC)    GERD (gastroesophageal reflux disease)    during pregnancy    Pneumonia    hx of pneumonia 08/2013    S/P radiation therapy 05/03/2015 through 06/14/2015                                                      Right breast 4680 cGy in 26 sessions, right breast boost 1000 cGy in 5 sessions.  Right supraclavicular/axillary region 4680 cGy with a supplemental PA field to bring the axillary dose up to 4500 cGy in 26 sessions   Past Surgical History:  Procedure Laterality Date   BREAST LUMPECTOMY WITH RADIOACTIVE SEED  AND SENTINEL LYMPH NODE BIOPSY Right 03/29/2015   Procedure: BREAST LUMPECTOMY WITH RADIOACTIVE SEED AND SENTINEL LYMPH NODE BIOPSY;  Surgeon: Almond Lint, MD;  Location: [facility];  Service: General;  Laterality: Right;   CESAREAN SECTION     CHOLECYSTECTOMY     ESSURE TUBAL LIGATION     PORTACATH PLACEMENT Left 07/21/2014   Procedure: INSERTION PORT-A-CATH;  Surgeon: Almond Lint, MD;  Location: [facility];  Service: General;  Laterality: Left;   WISDOM TOOTH EXTRACTION       Current Outpatient Medications  Medication Sig Dispense Refill   acetaminophen (TYLENOL) 500 MG tablet Take 500 mg by mouth every 6 (six) hours as needed.     Ascorbic Acid (VITAMIN C) 500 MG CHEW      Calcium-Phosphorus-Vitamin D (CALCIUM GUMMIES PO) Take by mouth daily.     Camphor-Eucalyptus-Menthol (VICKS VAPORUB EX) Apply 1 application topically at bedtime. Applies under nose, throat and on chest.     cetirizine (ZYRTEC) 10 MG tablet Take 10 mg by mouth daily as needed for allergies (allergies).      Fluocinolone Acetonide Body 0.01 % OIL Apply topically 4 (four) times a week.  fluticasone (FLONASE) 50 MCG/ACT nasal spray Place into both nostrils daily.     gabapentin (NEURONTIN) 100 MG capsule TAKE 2 CAPSULES(200 MG) BY MOUTH AT BEDTIME 60 capsule 3   hydrocortisone 2.5 % cream Apply 1 application topically 2 (two) times daily as needed.     ibuprofen (ADVIL,MOTRIN) 800 MG tablet Take 1 tablet (800 mg total) by mouth every 8 (eight) hours as needed. 60 tablet 1   lidocaine-prilocaine (EMLA) cream Apply as directed prior to chemotherapy 30 g 2   loperamide (IMODIUM A-D) 2 MG tablet Take 1 tablet (2 mg total) by mouth 4 (four) times daily as needed for diarrhea or loose stools. 30 tablet 0   losartan (COZAAR) 25 MG tablet TAKE 1 TABLET(25 MG) BY MOUTH DAILY 90 tablet 3   minoxidil (LONITEN) 2.5 MG tablet Take 2.5 mg by mouth daily. 1/2 tab once daily     Multiple Vitamins-Minerals (MULTIVITAMIN  GUMMIES ADULT PO) Take 1 each by mouth daily. Women's Vitafusion gummie     spironolactone (ALDACTONE) 25 MG tablet TAKE 1 TABLET BY MOUTH DAILY 90 tablet 3   No current facility-administered medications for this encounter.   Facility-Administered Medications Ordered in Other Encounters  Medication Dose Route Frequency Provider Last Rate Last Admin   acetaminophen (TYLENOL) tablet 650 mg  650 mg Oral Once Malachy Mood, MD       heparin lock flush 100 unit/mL  500 Units Intracatheter Once PRN Malachy Mood, MD       sodium chloride 0.9 % injection 10 mL  10 mL Intracatheter PRN Malachy Mood, MD   10 mL at 05/01/16 1622   sodium chloride 0.9 % injection 10 mL  10 mL Intravenous PRN Malachy Mood, MD       sodium chloride 0.9 % injection 10 mL  10 mL Intracatheter PRN Malachy Mood, MD   10 mL at 03/18/19 1715   sodium chloride flush (NS) 0.9 % injection 10 mL  10 mL Intravenous PRN Malachy Mood, MD        Allergies:   Adhesive [tape], Aspirin, and Codeine   Social History:  The patient  reports that she has never smoked. She has never used smokeless tobacco. She reports current alcohol use. She reports that she does not use drugs.   Family History:  The patient's family history includes Breast cancer (age of onset: 83) in her maternal aunt; Breast cancer (age of onset: 86) in her mother; Breast cancer (age of onset: 96) in her cousin; Breast cancer (age of onset: 31) in her maternal aunt; Liver cancer in her father and paternal grandmother; Prostate cancer in her paternal grandfather; Prostate cancer (age of onset: 6) in her maternal uncle; Prostate cancer (age of onset: 28) in her maternal uncle; Prostate cancer (age of onset: 74) in her maternal grandfather.   ROS:  Please see the history of present illness.   All other systems are personally reviewed and negative.    Vitals:   05/19/21 1542  BP: 130/80  Pulse: 68  SpO2: 100%  Weight: 104.9 kg (231 lb 3.2 oz)    Exam:  General:  Well appearing. No resp  difficulty HEENT: normal Neck: supple. no JVD. Carotids 2+ bilat; no bruits. No lymphadenopathy or thryomegaly appreciated. Cor: PMI nondisplaced. Regular rate & rhythm. No rubs, gallops or murmurs. Lungs: clear Abdomen: obese soft, nontender, nondistended. No hepatosplenomegaly. No bruits or masses. Good bowel sounds. Extremities: no cyanosis, clubbing, rash, edema Neuro: alert & orientedx3, cranial nerves grossly intact.  moves all 4 extremities w/o difficulty. Affect pleasant    Recent Labs: 05/03/2021: ALT 24; BUN 15; Creatinine, Ser 0.95; Hemoglobin 11.7; Platelets 237; Potassium 3.8; Sodium 141  Personally reviewed   Wt Readings from Last 3 Encounters:  05/19/21 104.9 kg (231 lb 3.2 oz)  05/03/21 103.6 kg (228 lb 8 oz)  04/12/21 102.7 kg (226 lb 8 oz)      ASSESSMENT AND PLAN:  1. R breast invasive ductal carcinoma, T3N1M1, stage IV, ER-/PR-/HER2+, with metastases to b/l lungs, biopsy confirmed - Echo today 05/19/21  EF 60-65% RV ok. GLS -24.9% Personally reviewed - I reviewed echos personally. EF and Doppler parameters stable. No HF on exam. Continue Herceptin.  - Repeat echo in 6 months    2. HTN -Blood pressure well controlled. Continue current regimen.   Arvilla Meres, MD  4:29 PM  [facility] [address] [facility] [place] [address] [phone] (office) [phone] (fax)

## 2021-05-24 ENCOUNTER — Other Ambulatory Visit: Payer: Self-pay

## 2021-05-24 ENCOUNTER — Inpatient Hospital Stay: Payer: BC Managed Care – PPO | Attending: Hematology

## 2021-05-24 VITALS — BP 139/80 | HR 95 | Temp 99.1°F | Resp 18

## 2021-05-24 DIAGNOSIS — Z23 Encounter for immunization: Secondary | ICD-10-CM

## 2021-05-24 DIAGNOSIS — Z5112 Encounter for antineoplastic immunotherapy: Secondary | ICD-10-CM | POA: Diagnosis not present

## 2021-05-24 DIAGNOSIS — C50311 Malignant neoplasm of lower-inner quadrant of right female breast: Secondary | ICD-10-CM | POA: Diagnosis present

## 2021-05-24 DIAGNOSIS — C50911 Malignant neoplasm of unspecified site of right female breast: Secondary | ICD-10-CM

## 2021-05-24 MED ORDER — TRASTUZUMAB-HYALURONIDASE-OYSK 600-10000 MG-UNT/5ML ~~LOC~~ SOLN
600.0000 mg | Freq: Once | SUBCUTANEOUS | Status: AC
  Administered 2021-05-24: 600 mg via SUBCUTANEOUS
  Filled 2021-05-24: qty 5

## 2021-05-24 MED ORDER — INFLUENZA VAC SPLIT QUAD 0.5 ML IM SUSY: 0.5000 mL | PREFILLED_SYRINGE | Freq: Once | INTRAMUSCULAR | Status: DC

## 2021-05-24 NOTE — Patient Instructions (Signed)
[facility]   Discharge Instructions: Thank you for choosing [facility] to provide your oncology and hematology care.   If you have a lab appointment with the [facility], please go directly to the [facility] and check in at the registration area.   Wear comfortable clothing and clothing appropriate for easy access to any Portacath or PICC line.   We strive to give you quality time with your provider. You may need to reschedule your appointment if you arrive late (15 or more minutes).  Arriving late affects you and other patients whose appointments are after yours.  Also, if you miss three or more appointments without notifying the office, you may be dismissed from the clinic at the provider's discretion.      For prescription refill requests, have your pharmacy contact our office and allow 72 hours for refills to be completed.    Today you received the following chemotherapy and/or immunotherapy agents: trastuzumab-hyaluronidase-oysk.      To help prevent nausea and vomiting after your treatment, we encourage you to take your nausea medication as directed.  BELOW ARE SYMPTOMS THAT SHOULD BE REPORTED IMMEDIATELY: *FEVER GREATER THAN 100.4 F (38 C) OR HIGHER *CHILLS OR SWEATING *NAUSEA AND VOMITING THAT IS NOT CONTROLLED WITH YOUR NAUSEA MEDICATION *UNUSUAL SHORTNESS OF BREATH *UNUSUAL BRUISING OR BLEEDING *URINARY PROBLEMS (pain or burning when urinating, or frequent urination) *BOWEL PROBLEMS (unusual diarrhea, constipation, pain near the anus) TENDERNESS IN MOUTH AND THROAT WITH OR WITHOUT PRESENCE OF ULCERS (sore throat, sores in mouth, or a toothache) UNUSUAL RASH, SWELLING OR PAIN  UNUSUAL VAGINAL DISCHARGE OR ITCHING   Items with * indicate a potential emergency and should be followed up as soon as possible or go to the Emergency Department if any problems should occur.  Please show the CHEMOTHERAPY ALERT CARD or IMMUNOTHERAPY  ALERT CARD at check-in to the Emergency Department and triage nurse.  Should you have questions after your visit or need to cancel or reschedule your appointment, please contact [facility]  Dept: [phone]  and follow the prompts.  Office hours are 8:00 a.m. to 4:30 p.m. Monday - Friday. Please note that voicemails left after 4:00 p.m. may not be returned until the following business day.  We are closed weekends and major holidays. You have access to a nurse at all times for urgent questions. Please call the main number to the clinic Dept: [phone] and follow the prompts.   For any non-urgent questions, you may also contact your provider using MyChart. We now offer e-Visits for anyone 67 and older to request care online for non-urgent symptoms. For details visit [email or web address].   Also download the MyChart app! Go to the app store, search "MyChart", open the app, select [facility], and log in with your MyChart username and password.  Due to Covid, a mask is required upon entering the hospital/clinic. If you do not have a mask, one will be given to you upon arrival. For doctor visits, patients may have 1 support person aged 68 or older with them. For treatment visits, patients cannot have anyone with them due to current Covid guidelines and our immunocompromised population.

## 2021-06-14 ENCOUNTER — Other Ambulatory Visit: Payer: Self-pay

## 2021-06-14 ENCOUNTER — Inpatient Hospital Stay: Payer: BC Managed Care – PPO | Attending: Hematology

## 2021-06-14 ENCOUNTER — Inpatient Hospital Stay: Payer: BC Managed Care – PPO

## 2021-06-14 VITALS — BP 147/71 | HR 73 | Temp 99.2°F | Resp 16

## 2021-06-14 DIAGNOSIS — Z23 Encounter for immunization: Secondary | ICD-10-CM | POA: Diagnosis not present

## 2021-06-14 DIAGNOSIS — C7801 Secondary malignant neoplasm of right lung: Secondary | ICD-10-CM | POA: Diagnosis not present

## 2021-06-14 DIAGNOSIS — Z5112 Encounter for antineoplastic immunotherapy: Secondary | ICD-10-CM | POA: Diagnosis not present

## 2021-06-14 DIAGNOSIS — G620 Drug-induced polyneuropathy: Secondary | ICD-10-CM | POA: Diagnosis not present

## 2021-06-14 DIAGNOSIS — N200 Calculus of kidney: Secondary | ICD-10-CM | POA: Insufficient documentation

## 2021-06-14 DIAGNOSIS — C7802 Secondary malignant neoplasm of left lung: Secondary | ICD-10-CM | POA: Insufficient documentation

## 2021-06-14 DIAGNOSIS — C50911 Malignant neoplasm of unspecified site of right female breast: Secondary | ICD-10-CM

## 2021-06-14 DIAGNOSIS — Z171 Estrogen receptor negative status [ER-]: Secondary | ICD-10-CM | POA: Insufficient documentation

## 2021-06-14 DIAGNOSIS — C50311 Malignant neoplasm of lower-inner quadrant of right female breast: Secondary | ICD-10-CM | POA: Diagnosis present

## 2021-06-14 DIAGNOSIS — C7800 Secondary malignant neoplasm of unspecified lung: Secondary | ICD-10-CM

## 2021-06-14 DIAGNOSIS — Z95828 Presence of other vascular implants and grafts: Secondary | ICD-10-CM

## 2021-06-14 LAB — COMPREHENSIVE METABOLIC PANEL
ALT: 28 U/L (ref 0–44)
AST: 19 U/L (ref 15–41)
Albumin: 3.8 g/dL (ref 3.5–5.0)
Alkaline Phosphatase: 74 U/L (ref 38–126)
Anion gap: 9 (ref 5–15)
BUN: 14 mg/dL (ref 6–20)
CO2: 28 mmol/L (ref 22–32)
Calcium: 9.5 mg/dL (ref 8.9–10.3)
Chloride: 105 mmol/L (ref 98–111)
Creatinine, Ser: 0.84 mg/dL (ref 0.44–1.00)
GFR, Estimated: >60 mL/min (ref >60)
Glucose, Bld: 105 mg/dL — ABNORMAL HIGH (ref 70–99)
Potassium: 3.4 mmol/L — ABNORMAL LOW (ref 3.5–5.1)
Sodium: 142 mmol/L (ref 135–145)
Total Bilirubin: 0.4 mg/dL (ref 0.3–1.2)
Total Protein: 7.9 g/dL (ref 6.5–8.1)

## 2021-06-14 LAB — CBC WITH DIFFERENTIAL/PLATELET
Abs Immature Granulocytes: 0.01 10*3/uL (ref 0.00–0.07)
Basophils Absolute: 0 10*3/uL (ref 0.0–0.1)
Basophils Relative: 0 %
Eosinophils Absolute: 0.1 10*3/uL (ref 0.0–0.5)
Eosinophils Relative: 2 %
HCT: 37 % (ref 36.0–46.0)
Hemoglobin: 12.2 g/dL (ref 12.0–15.0)
Immature Granulocytes: 0 %
Lymphocytes Relative: 33 %
Lymphs Abs: 2.1 10*3/uL (ref 0.7–4.0)
MCH: 29.5 pg (ref 26.0–34.0)
MCHC: 33 g/dL (ref 30.0–36.0)
MCV: 89.4 fL (ref 80.0–100.0)
Monocytes Absolute: 0.5 10*3/uL (ref 0.1–1.0)
Monocytes Relative: 9 %
Neutro Abs: 3.5 10*3/uL (ref 1.7–7.7)
Neutrophils Relative %: 56 %
Platelets: 264 10*3/uL (ref 150–400)
RBC: 4.14 MIL/uL (ref 3.87–5.11)
RDW: 13 % (ref 11.5–15.5)
WBC: 6.2 10*3/uL (ref 4.0–10.5)
nRBC: 0 % (ref 0.0–0.2)

## 2021-06-14 MED ORDER — INFLUENZA VAC SPLIT QUAD 0.5 ML IM SUSY
0.5000 mL | PREFILLED_SYRINGE | Freq: Once | INTRAMUSCULAR | Status: AC
  Administered 2021-06-14: 0.5 mL via INTRAMUSCULAR
  Filled 2021-06-14: qty 0.5

## 2021-06-14 MED ORDER — TRASTUZUMAB-HYALURONIDASE-OYSK 600-10000 MG-UNT/5ML ~~LOC~~ SOLN
600.0000 mg | Freq: Once | SUBCUTANEOUS | Status: AC
  Administered 2021-06-14: 600 mg via SUBCUTANEOUS
  Filled 2021-06-14: qty 5

## 2021-06-14 MED ORDER — SODIUM CHLORIDE 0.9% FLUSH
10.0000 mL | Freq: Once | INTRAVENOUS | Status: AC
  Administered 2021-06-14: 10 mL

## 2021-06-14 MED ORDER — HEPARIN SOD (PORK) LOCK FLUSH 100 UNIT/ML IV SOLN
500.0000 [IU] | Freq: Once | INTRAVENOUS | Status: AC | PRN
  Administered 2021-06-14: 500 [IU] via INTRAVENOUS

## 2021-06-14 NOTE — Patient Instructions (Signed)
[facility]   Discharge Instructions: Thank you for choosing [facility] to provide your oncology and hematology care.   If you have a lab appointment with the [facility], please go directly to the [facility] and check in at the registration area.   Wear comfortable clothing and clothing appropriate for easy access to any Portacath or PICC line.   We strive to give you quality time with your provider. You may need to reschedule your appointment if you arrive late (15 or more minutes).  Arriving late affects you and other patients whose appointments are after yours.  Also, if you miss three or more appointments without notifying the office, you may be dismissed from the clinic at the provider's discretion.      For prescription refill requests, have your pharmacy contact our office and allow 72 hours for refills to be completed.    Today you received the following chemotherapy and/or immunotherapy agents: trastuzumab-hyaluronidase-oysk.      To help prevent nausea and vomiting after your treatment, we encourage you to take your nausea medication as directed.  BELOW ARE SYMPTOMS THAT SHOULD BE REPORTED IMMEDIATELY: *FEVER GREATER THAN 100.4 F (38 C) OR HIGHER *CHILLS OR SWEATING *NAUSEA AND VOMITING THAT IS NOT CONTROLLED WITH YOUR NAUSEA MEDICATION *UNUSUAL SHORTNESS OF BREATH *UNUSUAL BRUISING OR BLEEDING *URINARY PROBLEMS (pain or burning when urinating, or frequent urination) *BOWEL PROBLEMS (unusual diarrhea, constipation, pain near the anus) TENDERNESS IN MOUTH AND THROAT WITH OR WITHOUT PRESENCE OF ULCERS (sore throat, sores in mouth, or a toothache) UNUSUAL RASH, SWELLING OR PAIN  UNUSUAL VAGINAL DISCHARGE OR ITCHING   Items with * indicate a potential emergency and should be followed up as soon as possible or go to the Emergency Department if any problems should occur.  Please show the CHEMOTHERAPY ALERT CARD or IMMUNOTHERAPY  ALERT CARD at check-in to the Emergency Department and triage nurse.  Should you have questions after your visit or need to cancel or reschedule your appointment, please contact [facility]  Dept: [phone]  and follow the prompts.  Office hours are 8:00 a.m. to 4:30 p.m. Monday - Friday. Please note that voicemails left after 4:00 p.m. may not be returned until the following business day.  We are closed weekends and major holidays. You have access to a nurse at all times for urgent questions. Please call the main number to the clinic Dept: [phone] and follow the prompts.   For any non-urgent questions, you may also contact your provider using MyChart. We now offer e-Visits for anyone 61 and older to request care online for non-urgent symptoms. For details visit [email or web address].   Also download the MyChart app! Go to the app store, search "MyChart", open the app, select [facility], and log in with your MyChart username and password.  Due to Covid, a mask is required upon entering the hospital/clinic. If you do not have a mask, one will be given to you upon arrival. For doctor visits, patients may have 1 support person aged 48 or older with them. For treatment visits, patients cannot have anyone with them due to current Covid guidelines and our immunocompromised population.

## 2021-06-16 ENCOUNTER — Other Ambulatory Visit (HOSPITAL_COMMUNITY): Payer: Self-pay | Admitting: Internal Medicine

## 2021-06-16 ENCOUNTER — Other Ambulatory Visit (HOSPITAL_COMMUNITY): Payer: Self-pay | Admitting: *Deleted

## 2021-06-16 MED ORDER — SPIRONOLACTONE 25 MG PO TABS: 25.0000 mg | ORAL_TABLET | Freq: Every day | ORAL | 3 refills | Status: DC

## 2021-06-29 DIAGNOSIS — L219 Seborrheic dermatitis, unspecified: Secondary | ICD-10-CM | POA: Insufficient documentation

## 2021-06-29 DIAGNOSIS — L658 Other specified nonscarring hair loss: Secondary | ICD-10-CM | POA: Insufficient documentation

## 2021-07-02 NOTE — Progress Notes (Addendum)
[name]   [phone] [phone]   Clinic Follow up Note   Patient Care Team: Virginia Rochester, [place] as PCP - General (Family Medicine) [name], DO (Family Medicine) Stark Klein, MD as Consulting Physician (General Surgery) [name], MD as Consulting Physician (Hematology) Clydie Braun, MD (Obstetrics and Gynecology) Haroldine Laws, Shaune Pascal, MD as Consulting Physician (Cardiology) 07/05/2021  CHIEF COMPLAINT: Follow up right breast cancer   SUMMARY OF ONCOLOGIC HISTORY: Oncology History Overview Note  Breast cancer 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   Staging form: Breast, AJCC 7th Edition     Clinical stage from 07/22/2014: Stage IV (T3, N1, M1) - Unsigned     Breast cancer 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 ([name] [name])  07/02/2014 Mammogram   Mammogram showed a 2cm right beast mass and a 1.8cm right axillary node. MRI breast on 07/16/2014 showed 7cm R breast lesion and 4.4cm r axillary node    07/02/2014 Imaging   CT CAP: a 4.7cm mass in LUL lung and a 2.1cm mas in RML, and a small nodule in RUL, suspecious for metastasis     07/09/2014 Initial Diagnosis   right IDA with b/l lung lesions, ER-/PR-/HER2+   07/09/2014 Initial Biopsy   US guided right breast mass and axillary node biopsy showed IDA, and DCIS, ER-/PR-/HER2+   07/26/2014 Pathologic Stage   Left lung mass by IR, path revealed high grade carcinoma, morphology similar to breast tumor biopsy, TTF(-), NapsinA(-), ER(-)   08/04/2014 - 03/22/2015 Chemotherapy   weekly Paclitaxel 50m/m2, trastuzumab and pertuzumab every 3 weeks   08/30/2014 Genetic Testing   BreastNext panel was negative. 17 genes including BRCA1, BRCA2, were negative for mutations.    10/04/2014 Imaging   Interval decrease in the right axillary lymphadenopathy. Bilateral pulmonary lesions with left hilar lymphadenopathy also markedly decreased in the interval. The left hilar lymphadenopathy has resolved.   12/20/2014 Imaging   restaging  CT showed stable disease, no new lesions    03/29/2015 Pathology Results    right breast lumpectomy showed  chemotherapy treatment effect,  a 1 mm residual tumor,   margins were widely negative, 5 sentinel lymph nodes and 2 axillary lymph nodes were negative.    03/29/2015 Surgery    right breast lumpectomy and sentinel lymph node biopsy  by Dr. BBarry Dienes  04/12/2015 -  Chemotherapy   Herceptin maintenance therapy , 6 mg/kg, every 3 weeks since 04/12/15. Switched to Herceptin Hylecta injection q3weeks after 06/10/19.   05/03/2015 - 06/14/2015 Radiation Therapy   right breast adjuvant irradiation by Dr. MValere Dross   08/28/2016 Imaging   CT CAP w Contrast  IMPRESSION: 1. No acute process or evidence of metastatic disease within the chest, abdomen, or pelvis. 2. Similar to less well-defined left upper lobe density, likely scarring. No evidence of new or progressive pulmonary metastasis. 3. Right nephrolithiasis.   01/22/2017 Imaging   CT Chest W Contrast 01/22/17 IMPRESSION: 1. Stable exam. No new or progressive findings. No evidence for metastatic disease. 2. Left upper lobe architectural distortion/scarring is stable. 3. Nonobstructing right renal stone.   07/31/2017 Imaging   Bone Scan Whole Body 07/31/17  IMPRESSION: 1. No scintigraphic evidence of osseous metastatic disease. 2. Thoracolumbar scoliosis.    07/31/2017 Imaging   CT CAP W Contrast 07/31/17 IMPRESSION: 1. No findings of active or recurrent malignancy. 2. Other imaging findings of potential clinical significance: Aortic Atherosclerosis (ICD10-I70.0). Mild cardiomegaly. Postoperative and radiation therapy findings in the right chest. Nonobstructive right nephrolithiasis. Thoracolumbar scoliosis.  01/20/2018 Echocardiogram   ECHO 01/20/2018 Study Conclusions   - Left ventricle: The cavity size was normal. Wall thickness was   normal. Systolic function was normal. The estimated ejection   fraction was in the range  of 55% to 60%. Wall motion was normal;   there were no regional wall motion abnormalities. Doppler   parameters are consistent with abnormal left ventricular   relaxation (grade 1 diastolic dysfunction). - Impressions: GLS -20.7% LS&' 10.4 cm.    02/17/2018 Imaging   02/17/2018 CT CAP IMPRESSION: 1. No evidence for residual or recurrent tumor or metastatic disease. 2.  Aortic Atherosclerosis (ICD10-I70.0). 3. Right renal calculi. 4. Scoliosis.   08/11/2018 Imaging   CT CAP W Contrast 08/11/18  IMPRESSION: No evidence for localized recurrence or metastatic disease in the chest, abdomen or pelvis.   02/23/2019 Imaging   CT CAP W Contrast  IMPRESSION: 1. No findings of active malignancy in the chest, abdomen, or pelvis. 2. 3 mm right kidney lower pole nonobstructive renal calculus. 3. Thoracolumbar scoliosis.   12/08/2019 Imaging   CT CAP W contrast  IMPRESSION: 1. Unchanged post treatment appearance of the lungs. No evidence of recurrent or new metastatic disease in the chest, abdomen, or pelvis.   2. Postoperative findings of right lumpectomy and axillary lymph node dissection.   3.  Nonobstructive right nephrolithiasis.   11/17/2020 Imaging   CT CAP IMPRESSION: 1. No evidence of recurrence or new metastatic disease within the chest, abdomen, or pelvis. 2. Similar post treatment changes in the lungs and postoperative findings of right lumpectomy and axillary lymph node dissection. 3. Colonic diverticulosis without findings of acute diverticulitis.     CURRENT THERAPY:  Maintenance Herceptin every 3 weeks since 04/12/15. Switched to Herceptin Hylecta injection q3weeks after 06/10/19.  INTERVAL HISTORY: [name] [name] is here for follow up as scheduled. Last seen 03/22/21 and continues injections q3 weeks, received 07/04/21. Last echo 05/19/21 showed normal GLS and EF 60-65%. She is doing well without new or specific concerns. She has intermittent neuropathy flare brought on by  hot temp and stress. This usually resolves spontaneously but takes 2 gabapentin PRN. She is not bothered by this. She has periodic diarrhea she attributes to gallbladder and stress but is currently not an issue. She is losing weight intentionally but cutting sweats, fried foods, and sodas during the week. 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 She is "in a good place" now. She has sinus drainage, managed with OTC meds. She is now off work on Thursdays. Dr. Burr Medico did a breast exam recently, patient also does self exams, she has no concerns.  Otherwise denies fever, chills, cough, chest pain, dyspnea, new/worsening bone pain, breast concerns such as new lump/mass, nipple discharge, inversion, skin change, or any other specific complaints.    MEDICAL HISTORY:  Past Medical History:  Diagnosis Date   Breast cancer ([facility])    Breast cancer ([facility])    CHF (congestive heart failure) ([facility])    GERD (gastroesophageal reflux disease)    during pregnancy    Pneumonia    hx of pneumonia 08/2013    S/P radiation therapy 05/03/2015 through 06/14/2015  Right breast 4680 cGy in 26 sessions, right breast boost 1000 cGy in 5 sessions.  Right supraclavicular/axillary region 4680 cGy with a supplemental PA field to bring the axillary dose up to 4500 cGy in 26 sessions    SURGICAL HISTORY: Past Surgical History:  Procedure Laterality Date   BREAST LUMPECTOMY WITH RADIOACTIVE SEED AND SENTINEL LYMPH NODE BIOPSY Right 03/29/2015   Procedure: BREAST LUMPECTOMY WITH RADIOACTIVE SEED AND SENTINEL LYMPH NODE BIOPSY;  Surgeon: Stark Klein, MD;  Location: [facility];  Service: General;  Laterality: Right;   CESAREAN SECTION     CHOLECYSTECTOMY     ESSURE TUBAL LIGATION     PORTACATH PLACEMENT Left 07/21/2014   Procedure: INSERTION PORT-A-CATH;  Surgeon: Stark Klein, MD;   Location: [facility];  Service: General;  Laterality: Left;   WISDOM TOOTH EXTRACTION      I have reviewed the social history and family history with the patient and they are unchanged from previous note.  ALLERGIES:  is allergic to adhesive [tape], aspirin, and codeine.  MEDICATIONS:  Current Outpatient Medications  Medication Sig Dispense Refill   acetaminophen (TYLENOL) 500 MG tablet Take 500 mg by mouth every 6 (six) hours as needed.     Ascorbic Acid (VITAMIN C) 500 MG CHEW      Calcium-Phosphorus-Vitamin D (CALCIUM GUMMIES PO) Take by mouth daily.     Camphor-Eucalyptus-Menthol (VICKS VAPORUB EX) Apply 1 application topically at bedtime. Applies under nose, throat and on chest.     cetirizine (ZYRTEC) 10 MG tablet Take 10 mg by mouth daily as needed for allergies (allergies).      Fluocinolone Acetonide Body 0.01 % OIL Apply topically 4 (four) times a week.     fluticasone (FLONASE) 50 MCG/ACT nasal spray Place into both nostrils daily.     gabapentin (NEURONTIN) 100 MG capsule TAKE 2 CAPSULES(200 MG) BY MOUTH AT BEDTIME 60 capsule 3   hydrocortisone 2.5 % cream Apply 1 application topically 2 (two) times daily as needed.     ibuprofen (ADVIL,MOTRIN) 800 MG tablet Take 1 tablet (800 mg total) by mouth every 8 (eight) hours as needed. 60 tablet 1   lidocaine-prilocaine (EMLA) cream Apply as directed prior to chemotherapy 30 g 2   loperamide (IMODIUM A-D) 2 MG tablet Take 1 tablet (2 mg total) by mouth 4 (four) times daily as needed for diarrhea or loose stools. 30 tablet 0   losartan (COZAAR) 25 MG tablet TAKE 1 TABLET(25 MG) BY MOUTH DAILY 90 tablet 3   minoxidil (LONITEN) 2.5 MG tablet Take 2.5 mg by mouth daily. 1/2 tab once daily     Multiple Vitamins-Minerals (MULTIVITAMIN GUMMIES ADULT PO) Take 1 each by mouth daily. Women's Vitafusion gummie     spironolactone (ALDACTONE) 25 MG tablet Take 1 tablet (25 mg total) by mouth daily. 90 tablet 3   No current facility-administered  medications for this visit.   Facility-Administered Medications Ordered in Other Visits  Medication Dose Route Frequency Provider Last Rate Last Admin   acetaminophen (TYLENOL) tablet 650 mg  650 mg Oral Once [name], MD       heparin lock flush 100 unit/mL  500 Units Intracatheter Once PRN [name], MD       sodium chloride 0.9 % injection 10 mL  10 mL Intracatheter PRN [name], MD   10 mL at 05/01/16 1622   sodium chloride 0.9 % injection 10 mL  10 mL Intravenous PRN [name], MD  sodium chloride 0.9 % injection 10 mL  10 mL Intracatheter PRN [name], MD   10 mL at 03/18/19 1715   sodium chloride flush (NS) 0.9 % injection 10 mL  10 mL Intravenous PRN [name], MD        PHYSICAL EXAMINATION: ECOG PERFORMANCE STATUS: 0 - Asymptomatic  Vitals:   07/05/21 0908  BP: 134/72  Pulse: 65  Resp: 18  Temp: (!) 97.2 F (36.2 C)  SpO2: 100%   Filed Weights   07/05/21 0908  Weight: 225 lb 3 oz (102.1 kg)    GENERAL:alert, no distress and comfortable SKIN: no rash  EYES:  sclera clear NECK: without mass LYMPH:  no palpable cervical or supraclavicular lymphadenopathy LUNGS: clear with normal breathing effort HEART: regular rate & rhythm, no lower extremity edema ABDOMEN:abdomen soft, non-tender and normal bowel sounds Musculoskeletal: no focal spinal tenderness NEURO: alert & oriented x 3 with fluent speech, no focal motor deficits PAC without erythema Breast exam deferred  LABORATORY DATA:  I have reviewed the data as listed CBC Latest Ref Rng & Units 06/14/2021 05/03/2021 03/22/2021  WBC 4.0 - 10.5 K/uL 6.2 6.7 6.4  Hemoglobin 12.0 - 15.0 g/dL 12.2 11.7(L) 11.8(L)  Hematocrit 36.0 - 46.0 % 37.0 35.0(L) 35.5(L)  Platelets 150 - 400 K/uL 264 237 226     CMP Latest Ref Rng & Units 06/14/2021 05/03/2021 03/22/2021  Glucose 70 - 99 mg/dL 105(H) 105(H) 114(H)  BUN 6 - 20 mg/dL 14 15 14   Creatinine 0.44 - 1.00 mg/dL 0.84 0.95 0.86  Sodium 135 - 145 mmol/L 142 141 139   Potassium 3.5 - 5.1 mmol/L 3.4(L) 3.8 3.9  Chloride 98 - 111 mmol/L 105 104 105  CO2 22 - 32 mmol/L 28 28 26   Calcium 8.9 - 10.3 mg/dL 9.5 9.4 9.4  Total Protein 6.5 - 8.1 g/dL 7.9 7.9 7.6  Total Bilirubin 0.3 - 1.2 mg/dL 0.4 0.4 0.5  Alkaline Phos 38 - 126 U/L 74 72 70  AST 15 - 41 U/L 19 19 20   ALT 0 - 44 U/L 28 24 18       RADIOGRAPHIC STUDIES: I have personally reviewed the radiological images as listed and agreed with the findings in the report. No results found.   ASSESSMENT & PLAN: [name] [name] is a 50 y.o. female with    1. 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  -CT CAP 11/17/20 showed NED, repeat in 1 year -Continues maintenance herceptin inj q3 weeks, tolerating well -annual mammograms in December   2. HTN -05/2021 echo was normal, continue q6 months -f/up Dr. BHaroldine Laws  3. 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 Health maintenance -she is losing weight intentionally by healthy eating and physical activity -UTD on flu vac, can receive last covid booster -UTD on pap and colonoscopy, due mammo 07/2021   5. Irregular bleeding, h/o uterine polyps removed 2020 -She has had irregular periods since her chemo for breast cancer ended in 2016.  -s/p D&C 11/2018 with benign polyp  -she continues having irregular vaginal bleeding, most recent endometrial biopsy 06/2021 was benign -continue f/[facility]  Disposition:  Ms. Neumeyer is clinically doing well. She continues q3 week herceptin inj which she tolerates well  without significant side effects. Residual CIPN is mild and intermittent, she does not meet criteria for ACCRU study. She has great PS. Recent labs are unremarkable, I encouraged her to increase dietary potassium. Overall there is no clinical evidence of progression.   Continue herceptin inj q3 weeks, flush q6 weeks, and lab/follow up in 12 weeks. Mammogram in December at [facility].   I spoke to genetic counselor, Roma Kayser, who recommends retest with RNA given patient's young age at diagnosis and other family members young age at diagnoses. I have referred her to discuss.  Orders Placed This Encounter  Procedures   Ambulatory referral to Genetics    Referral Priority:   Routine    Referral Type:   Consultation    Referral Reason:   Specialty Services Required    Number of Visits Requested:   1     All questions were answered. The patient knows to call the clinic with any problems, questions or concerns. No barriers to learning were detected.      Alla Feeling, NP 07/05/21

## 2021-07-04 ENCOUNTER — Inpatient Hospital Stay: Payer: BC Managed Care – PPO

## 2021-07-04 ENCOUNTER — Other Ambulatory Visit: Payer: Self-pay

## 2021-07-04 VITALS — BP 151/73 | HR 74 | Resp 18

## 2021-07-04 DIAGNOSIS — Z5112 Encounter for antineoplastic immunotherapy: Secondary | ICD-10-CM | POA: Diagnosis not present

## 2021-07-04 DIAGNOSIS — C50911 Malignant neoplasm of unspecified site of right female breast: Secondary | ICD-10-CM

## 2021-07-04 MED ORDER — SODIUM CHLORIDE 0.9 % IV SOLN: Freq: Once | INTRAVENOUS | Status: DC

## 2021-07-04 MED ORDER — TRASTUZUMAB-HYALURONIDASE-OYSK 600-10000 MG-UNT/5ML ~~LOC~~ SOLN
600.0000 mg | Freq: Once | SUBCUTANEOUS | Status: AC
  Administered 2021-07-04: 600 mg via SUBCUTANEOUS
  Filled 2021-07-04: qty 5

## 2021-07-04 NOTE — Patient Instructions (Signed)
[facility]COL[facility]  Discharge Instructions: Thank you for choosing [facility] to provide your oncology and hematology care.   If you have a lab appointment with the [facility], please go directly to the [facility] and check in at the registration area.   Wear comfortable clothing and clothing appropriate for easy access to any Portacath or PICC line.   We strive to give you quality time with your provider. You may need to reschedule your appointment if you arrive late (15 or more minutes).  Arriving late affects you and other patients whose appointments are after yours.  Also, if you miss three or more appointments without notifying the office, you may be dismissed from the clinic at the provider's discretion.      For prescription refill requests, have your pharmacy contact our office and allow 72 hours for refills to be completed.    Today you received the following chemotherapy and/or immunotherapy agents Herceptin Hylecta      To help prevent nausea and vomiting after your treatment, we encourage you to take your nausea medication as directed.  BELOW ARE SYMPTOMS THAT SHOULD BE REPORTED IMMEDIATELY: *FEVER GREATER THAN 100.4 F (38 C) OR HIGHER *CHILLS OR SWEATING *NAUSEA AND VOMITING THAT IS NOT CONTROLLED WITH YOUR NAUSEA MEDICATION *UNUSUAL SHORTNESS OF BREATH *UNUSUAL BRUISING OR BLEEDING *URINARY PROBLEMS (pain or burning when urinating, or frequent urination) *BOWEL PROBLEMS (unusual diarrhea, constipation, pain near the anus) TENDERNESS IN MOUTH AND THROAT WITH OR WITHOUT PRESENCE OF ULCERS (sore throat, sores in mouth, or a toothache) UNUSUAL RASH, SWELLING OR PAIN  UNUSUAL VAGINAL DISCHARGE OR ITCHING   Items with * indicate a potential emergency and should be followed up as soon as possible or go to the Emergency Department if any problems should occur.  Please show the CHEMOTHERAPY ALERT CARD or IMMUNOTHERAPY ALERT CARD at  check-in to the Emergency Department and triage nurse.  Should you have questions after your visit or need to cancel or reschedule your appointment, please contact [facility]  Dept: [phone]  and follow the prompts.  Office hours are 8:00 a.m. to 4:30 p.m. Monday - Friday. Please note that voicemails left after 4:00 p.m. may not be returned until the following business day.  We are closed weekends and major holidays. You have access to a nurse at all times for urgent questions. Please call the main number to the clinic Dept: [phone] and follow the prompts.   For any non-urgent questions, you may also contact your provider using MyChart. We now offer e-Visits for anyone 30 and older to request care online for non-urgent symptoms. For details visit [email or web address].   Also download the MyChart app! Go to the app store, search "MyChart", open the app, select [facility], and log in with your MyChart username and password.  Due to Covid, a mask is required upon entering the hospital/clinic. If you do not have a mask, one will be given to you upon arrival. For doctor visits, patients may have 1 support person aged 37 or older with them. For treatment visits, patients cannot have anyone with them due to current Covid guidelines and our immunocompromised population.

## 2021-07-05 ENCOUNTER — Encounter: Payer: Self-pay | Admitting: Nurse Practitioner

## 2021-07-05 ENCOUNTER — Ambulatory Visit: Payer: BC Managed Care – PPO

## 2021-07-05 ENCOUNTER — Ambulatory Visit: Payer: BC Managed Care – PPO | Admitting: Nurse Practitioner

## 2021-07-05 ENCOUNTER — Inpatient Hospital Stay (HOSPITAL_BASED_OUTPATIENT_CLINIC_OR_DEPARTMENT_OTHER): Payer: BC Managed Care – PPO | Admitting: Nurse Practitioner

## 2021-07-05 VITALS — BP 134/72 | HR 65 | Temp 97.2°F | Resp 18 | Wt 225.2 lb

## 2021-07-05 DIAGNOSIS — C50911 Malignant neoplasm of unspecified site of right female breast: Secondary | ICD-10-CM | POA: Diagnosis not present

## 2021-07-05 DIAGNOSIS — C7800 Secondary malignant neoplasm of unspecified lung: Secondary | ICD-10-CM

## 2021-07-05 DIAGNOSIS — Z5112 Encounter for antineoplastic immunotherapy: Secondary | ICD-10-CM | POA: Diagnosis not present

## 2021-07-05 NOTE — Addendum Note (Signed)
Addended by: Alla Feeling on: 07/05/2021 01:36 PM   Modules accepted: Orders

## 2021-07-07 ENCOUNTER — Telehealth: Payer: Self-pay | Admitting: Nurse Practitioner

## 2021-07-07 NOTE — Telephone Encounter (Signed)
Scheduled appointment per scheduling message. Patient is aware.

## 2021-07-07 NOTE — Telephone Encounter (Signed)
Scheduled follow-up appointments per 11/23 los. Patient is aware.

## 2021-07-27 ENCOUNTER — Inpatient Hospital Stay: Payer: BC Managed Care – PPO

## 2021-07-27 ENCOUNTER — Encounter: Payer: Self-pay | Admitting: Genetic Counselor

## 2021-07-27 ENCOUNTER — Other Ambulatory Visit: Payer: Self-pay

## 2021-07-27 ENCOUNTER — Other Ambulatory Visit: Payer: Self-pay | Admitting: Genetic Counselor

## 2021-07-27 ENCOUNTER — Inpatient Hospital Stay: Payer: BC Managed Care – PPO | Attending: Hematology | Admitting: Genetic Counselor

## 2021-07-27 ENCOUNTER — Other Ambulatory Visit: Payer: Self-pay | Admitting: Hematology

## 2021-07-27 VITALS — BP 145/71 | HR 60 | Resp 18

## 2021-07-27 DIAGNOSIS — C50911 Malignant neoplasm of unspecified site of right female breast: Secondary | ICD-10-CM

## 2021-07-27 DIAGNOSIS — C50311 Malignant neoplasm of lower-inner quadrant of right female breast: Secondary | ICD-10-CM | POA: Diagnosis present

## 2021-07-27 DIAGNOSIS — Z5112 Encounter for antineoplastic immunotherapy: Secondary | ICD-10-CM | POA: Insufficient documentation

## 2021-07-27 DIAGNOSIS — Z803 Family history of malignant neoplasm of breast: Secondary | ICD-10-CM | POA: Diagnosis not present

## 2021-07-27 DIAGNOSIS — C7800 Secondary malignant neoplasm of unspecified lung: Secondary | ICD-10-CM | POA: Diagnosis not present

## 2021-07-27 DIAGNOSIS — Z8042 Family history of malignant neoplasm of prostate: Secondary | ICD-10-CM

## 2021-07-27 DIAGNOSIS — Z95828 Presence of other vascular implants and grafts: Secondary | ICD-10-CM

## 2021-07-27 LAB — CBC WITH DIFFERENTIAL/PLATELET
Abs Immature Granulocytes: 0.01 10*3/uL (ref 0.00–0.07)
Basophils Absolute: 0 10*3/uL (ref 0.0–0.1)
Basophils Relative: 0 %
Eosinophils Absolute: 0.1 10*3/uL (ref 0.0–0.5)
Eosinophils Relative: 1 %
HCT: 33 % — ABNORMAL LOW (ref 36.0–46.0)
Hemoglobin: 11.2 g/dL — ABNORMAL LOW (ref 12.0–15.0)
Immature Granulocytes: 0 %
Lymphocytes Relative: 35 %
Lymphs Abs: 2.2 10*3/uL (ref 0.7–4.0)
MCH: 29.8 pg (ref 26.0–34.0)
MCHC: 33.9 g/dL (ref 30.0–36.0)
MCV: 87.8 fL (ref 80.0–100.0)
Monocytes Absolute: 0.6 10*3/uL (ref 0.1–1.0)
Monocytes Relative: 9 %
Neutro Abs: 3.4 10*3/uL (ref 1.7–7.7)
Neutrophils Relative %: 55 %
Platelets: 224 10*3/uL (ref 150–400)
RBC: 3.76 MIL/uL — ABNORMAL LOW (ref 3.87–5.11)
RDW: 13.1 % (ref 11.5–15.5)
WBC: 6.3 10*3/uL (ref 4.0–10.5)
nRBC: 0 % (ref 0.0–0.2)

## 2021-07-27 LAB — COMPREHENSIVE METABOLIC PANEL
ALT: 13 U/L (ref 0–44)
AST: 12 U/L — ABNORMAL LOW (ref 15–41)
Albumin: 3.4 g/dL — ABNORMAL LOW (ref 3.5–5.0)
Alkaline Phosphatase: 63 U/L (ref 38–126)
Anion gap: 8 (ref 5–15)
BUN: 11 mg/dL (ref 6–20)
CO2: 26 mmol/L (ref 22–32)
Calcium: 9 mg/dL (ref 8.9–10.3)
Chloride: 107 mmol/L (ref 98–111)
Creatinine, Ser: 0.79 mg/dL (ref 0.44–1.00)
GFR, Estimated: >60 mL/min (ref >60)
Glucose, Bld: 82 mg/dL (ref 70–99)
Potassium: 3.6 mmol/L (ref 3.5–5.1)
Sodium: 141 mmol/L (ref 135–145)
Total Bilirubin: 0.4 mg/dL (ref 0.3–1.2)
Total Protein: 7.4 g/dL (ref 6.5–8.1)

## 2021-07-27 LAB — GENETIC SCREENING ORDER

## 2021-07-27 MED ORDER — SODIUM CHLORIDE 0.9% FLUSH
10.0000 mL | Freq: Once | INTRAVENOUS | Status: AC
  Administered 2021-07-27: 10 mL

## 2021-07-27 MED ORDER — TRASTUZUMAB-HYALURONIDASE-OYSK 600-10000 MG-UNT/5ML ~~LOC~~ SOLN
600.0000 mg | Freq: Once | SUBCUTANEOUS | Status: AC
  Administered 2021-07-27: 600 mg via SUBCUTANEOUS
  Filled 2021-07-27: qty 5

## 2021-07-27 MED ORDER — HEPARIN SOD (PORK) LOCK FLUSH 100 UNIT/ML IV SOLN
500.0000 [IU] | Freq: Once | INTRAVENOUS | Status: AC | PRN
  Administered 2021-07-27: 500 [IU] via INTRAVENOUS

## 2021-07-27 NOTE — Progress Notes (Signed)
Patient tolerated Herceptin Hylecta injection well today, ice applied. No concerns voiced. Patient discharged to upstairs genetic counsel appointment, stable.

## 2021-07-27 NOTE — Progress Notes (Signed)
REFERRING PROVIDER: [name], MD [place],  [place] [address]  PRIMARY PROVIDER:  Virginia Rochester, [place]  PRIMARY REASON FOR VISIT:  1. Family history of prostate cancer   2. 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 Family history of breast cancer      HISTORY OF PRESENT ILLNESS:   Ms. Cranmer, a 50 y.o. female, was seen for a [facility] cancer genetics consultation at the request of Dr. Burr Medico due to a personal and family history of breast cancer and a family history of prostate cancer.  Ms. Mcwatters presents to clinic today to discuss the possibility of a hereditary predisposition to cancer, genetic testing, and to further clarify her future cancer risks, as well as potential cancer risks for family members.   In 2015, at the age of 27, Ms. Pitzer was diagnosed with metastatic cancer of the breast. She had genetic testing in 2015 from [facility] through the [facility] panel and it was negative.      CANCER HISTORY:  Oncology History Overview Note  Breast cancer 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   Staging form: Breast, AJCC 7th Edition     Clinical stage from 07/22/2014: Stage IV (T3, N1, M1) - Unsigned     Breast cancer 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 ([place])  07/02/2014 Mammogram   Mammogram showed a 2cm right beast mass and a 1.8cm right axillary node. MRI breast on 07/16/2014 showed 7cm R breast lesion and 4.4cm r axillary node    07/02/2014 Imaging   CT CAP: a 4.7cm mass in LUL lung and a 2.1cm mas in RML, and a small nodule in RUL, suspecious for metastasis     07/09/2014 Initial Diagnosis   right IDA with b/l lung lesions, ER-/PR-/HER2+   07/09/2014 Initial Biopsy   US guided right breast mass and axillary node biopsy showed IDA, and DCIS, ER-/PR-/HER2+   07/26/2014 Pathologic Stage   Left lung mass by IR, path revealed high grade carcinoma, morphology similar to breast tumor biopsy, TTF(-), NapsinA(-), ER(-)   08/04/2014 - 03/22/2015 Chemotherapy   weekly  Paclitaxel 53m/m2, trastuzumab and pertuzumab every 3 weeks   08/30/2014 Genetic Testing   BreastNext panel was negative. 17 genes including BRCA1, BRCA2, were negative for mutations.    10/04/2014 Imaging   Interval decrease in the right axillary lymphadenopathy. Bilateral pulmonary lesions with left hilar lymphadenopathy also markedly decreased in the interval. The left hilar lymphadenopathy has resolved.   12/20/2014 Imaging   restaging CT showed stable disease, no new lesions    03/29/2015 Pathology Results    right breast lumpectomy showed  chemotherapy treatment effect,  a 1 mm residual tumor,   margins were widely negative, 5 sentinel lymph nodes and 2 axillary lymph nodes were negative.    03/29/2015 Surgery    right breast lumpectomy and sentinel lymph node biopsy  by Dr. BBarry Dienes  04/12/2015 -  Chemotherapy   Herceptin maintenance therapy , 6 mg/kg, every 3 weeks since 04/12/15. Switched to Herceptin Hylecta injection q3weeks after 06/10/19.   05/03/2015 - 06/14/2015 Radiation Therapy   right breast adjuvant irradiation by Dr. MValere Dross   08/28/2016 Imaging   CT CAP w Contrast  IMPRESSION: 1. No acute process or evidence of metastatic disease within the chest, abdomen, or pelvis. 2. Similar to less well-defined left upper lobe density, likely scarring. No evidence of new or progressive pulmonary metastasis. 3. Right nephrolithiasis.   01/22/2017 Imaging   CT Chest W Contrast 01/22/17 IMPRESSION: 1. Stable  exam. No new or progressive findings. No evidence for metastatic disease. 2. Left upper lobe architectural distortion/scarring is stable. 3. Nonobstructing right renal stone.   07/31/2017 Imaging   Bone Scan Whole Body 07/31/17  IMPRESSION: 1. No scintigraphic evidence of osseous metastatic disease. 2. Thoracolumbar scoliosis.    07/31/2017 Imaging   CT CAP W Contrast 07/31/17 IMPRESSION: 1. No findings of active or recurrent malignancy. 2. Other imaging findings of  potential clinical significance: Aortic Atherosclerosis (ICD10-I70.0). Mild cardiomegaly. Postoperative and radiation therapy findings in the right chest. Nonobstructive right nephrolithiasis. Thoracolumbar scoliosis.     01/20/2018 Echocardiogram   ECHO 01/20/2018 Study Conclusions   - Left ventricle: The cavity size was normal. Wall thickness was   normal. Systolic function was normal. The estimated ejection   fraction was in the range of 55% to 60%. Wall motion was normal;   there were no regional wall motion abnormalities. Doppler   parameters are consistent with abnormal left ventricular   relaxation (grade 1 diastolic dysfunction). - Impressions: GLS -20.7% LS&' 10.4 cm.    02/17/2018 Imaging   02/17/2018 CT CAP IMPRESSION: 1. No evidence for residual or recurrent tumor or metastatic disease. 2.  Aortic Atherosclerosis (ICD10-I70.0). 3. Right renal calculi. 4. Scoliosis.   08/11/2018 Imaging   CT CAP W Contrast 08/11/18  IMPRESSION: No evidence for localized recurrence or metastatic disease in the chest, abdomen or pelvis.   02/23/2019 Imaging   CT CAP W Contrast  IMPRESSION: 1. No findings of active malignancy in the chest, abdomen, or pelvis. 2. 3 mm right kidney lower pole nonobstructive renal calculus. 3. Thoracolumbar scoliosis.   12/08/2019 Imaging   CT CAP W contrast  IMPRESSION: 1. Unchanged post treatment appearance of the lungs. No evidence of recurrent or new metastatic disease in the chest, abdomen, or pelvis.   2. Postoperative findings of right lumpectomy and axillary lymph node dissection.   3.  Nonobstructive right nephrolithiasis.   11/17/2020 Imaging   CT CAP IMPRESSION: 1. No evidence of recurrence or new metastatic disease within the chest, abdomen, or pelvis. 2. Similar post treatment changes in the lungs and postoperative findings of right lumpectomy and axillary lymph node dissection. 3. Colonic diverticulosis without findings of acute  diverticulitis.      RISK FACTORS:  Menarche was at age 62.  First live birth at age 55.  OCP use for approximately  14-15  years.  Ovaries intact: yes.  Hysterectomy: no.  Menopausal status: premenopausal.  HRT use: 0 years. Colonoscopy: yes; normal. Mammogram within the last year: yes. Number of breast biopsies: 1. Up to date with pelvic exams: yes. Any excessive radiation exposure in the past: no  Past Medical History:  Diagnosis Date   Breast cancer ([place])    Breast cancer ([place])    CHF (congestive heart failure) ([facility])    Family history of breast cancer    Family history of prostate cancer    GERD (gastroesophageal reflux disease)    during pregnancy    Pneumonia    hx of pneumonia 08/2013    S/P radiation therapy 05/03/2015 through 06/14/2015                                                      Right breast 4680 cGy in 26 sessions, right breast boost 1000 cGy in 5 sessions.  Right supraclavicular/axillary region 4680 cGy with a supplemental PA field to bring the axillary dose up to 4500 cGy in 26 sessions    Past Surgical History:  Procedure Laterality Date   BREAST LUMPECTOMY WITH RADIOACTIVE SEED AND SENTINEL LYMPH NODE BIOPSY Right 03/29/2015   Procedure: BREAST LUMPECTOMY WITH RADIOACTIVE SEED AND SENTINEL LYMPH NODE BIOPSY;  Surgeon: Stark Klein, MD;  Location: [facility];  Service: General;  Laterality: Right;   CESAREAN SECTION     CHOLECYSTECTOMY     ESSURE TUBAL LIGATION     PORTACATH PLACEMENT Left 07/21/2014   Procedure: INSERTION PORT-A-CATH;  Surgeon: Stark Klein, MD;  Location: [facility];  Service: General;  Laterality: Left;   WISDOM TOOTH EXTRACTION      Social History   Socioeconomic History   Marital status: Married    Spouse name: Not on file   Number of children: Not on file   Years of education: Not on file   Highest education level: Not on file  Occupational History   Not on file  Tobacco Use   Smoking status: Never    Smokeless tobacco: Never  Vaping Use   Vaping Use: Never used  Substance and Sexual Activity   Alcohol use: Yes    Comment: 2-3 drinks per week    Drug use: No   Sexual activity: Never    Birth control/protection: Surgical  Other Topics Concern   Not on file  Social History Narrative   Not on file   Social Determinants of Health   Financial Resource Strain: Not on file  Food Insecurity: Not on file  Transportation Needs: Not on file  Physical Activity: Not on file  Stress: Not on file  Social Connections: Not on file     FAMILY HISTORY:  We obtained a detailed, 4-generation family history.  Significant diagnoses are listed below: Family History  Problem Relation Age of Onset   Breast cancer Mother 81   Breast cancer Maternal Aunt 46   Breast cancer Maternal Aunt 59   Prostate cancer Maternal Uncle 73   Prostate cancer Maternal Uncle 68   Prostate cancer Maternal Grandfather 76   Liver cancer Paternal Grandmother    Prostate cancer Paternal Grandfather    Breast cancer Cousin 55       mat first cousin     The patient has a son and daughter who are cancer free.  She is an only child.  Her mother is living and her father is deceased.  The patient's mother was diagnosed with breast cancer at 86.  She has two brothers and two sisters.  Both brothers had prostate cancer and both sisters had breast cancer.  One sister had a daughter who had breast cancer.  The maternal grandparents are deceased.  The grandfather died of prostate cancer.  The patient's father is deceased.  She was estranged from her father's family, but she did know his parents.  They are deceased, the grandfather from prostate cancer and grandmother from liver cancer.  Ms. Putnam is aware of previous family history of genetic testing for hereditary cancer risks. Patient's maternal ancestors are of African American descent, and paternal ancestors are of African American descent. There is no reported Ashkenazi  Jewish ancestry. There is no known consanguinity.  GENETIC COUNSELING ASSESSMENT: Ms. Anglin is a 50 y.o. female with a personal and family history of cancer which is somewhat suggestive of a hereditary cancer syndrome and predisposition to cancer given the combination of cancer and  the ages of onset. We, therefore, discussed and recommended the following at today's visit.   DISCUSSION: We discussed that, in general, most cancer is not inherited in families, but instead is sporadic or familial. Sporadic cancers occur by chance and typically happen at older ages (>50 years) as this type of cancer is caused by genetic changes acquired during an individuals lifetime. Some families have more cancers than would be expected by chance; however, the ages or types of cancer are not consistent with a known genetic mutation or known genetic mutations have been ruled out. This type of familial cancer is thought to be due to a combination of multiple genetic, environmental, hormonal, and lifestyle factors. While this combination of factors likely increases the risk of cancer, the exact source of this risk is not currently identifiable or testable.  We discussed that 5 - 10% of breast cancer is hereditary, with most cases associated with BRCA mutations.  There are other genes that can be associated with hereditary breast cancer syndromes.  These include ATM, CHEK2 and PALB2. We reviewed her previous genetic testing, which was negative.  Since the time of her testing, we have newer technology and are testing for more genes which could now identify a mutation that was missed in the past.  We discussed that testing is beneficial for several reasons including knowing how to follow individuals after completing their treatment, identifying whether potential treatment options such as PARP inhibitors would be beneficial, and understand if other family members could be at risk for cancer and allow them to undergo genetic testing.    We reviewed the characteristics, features and inheritance patterns of hereditary cancer syndromes. We also discussed genetic testing, including the appropriate family members to test, the process of testing, insurance coverage and turn-around-time for results. We discussed the implications of a negative, positive, carrier and/or variant of uncertain significant result. We recommended Ms. Rego pursue genetic testing for the CancerNext-Expanded+RNAinsight gene panel.   The CancerNext-Expanded gene panel offered by [facility] and includes sequencing and rearrangement analysis for the following 77 genes: AIP, ALK, APC*, ATM*, AXIN2, BAP1, BARD1, BLM, BMPR1A, BRCA1*, BRCA2*, BRIP1*, CDC73, CDH1*, CDK4, CDKN1B, CDKN2A, CHEK2*, CTNNA1, DICER1, FANCC, FH, FLCN, GALNT12, KIF1B, LZTR1, MAX, MEN1, MET, MLH1*, MSH2*, MSH3, MSH6*, MUTYH*, NBN, NF1*, NF2, NTHL1, PALB2*, PHOX2B, PMS2*, POT1, PRKAR1A, PTCH1, PTEN*, RAD51C*, RAD51D*, RB1, RECQL, RET, SDHA, SDHAF2, SDHB, SDHC, SDHD, SMAD4, SMARCA4, SMARCB1, SMARCE1, STK11, SUFU, TMEM127, TP53*, TSC1, TSC2, VHL and XRCC2 (sequencing and deletion/duplication); EGFR, EGLN1, HOXB13, KIT, MITF, PDGFRA, POLD1, and POLE (sequencing only); EPCAM and GREM1 (deletion/duplication only). DNA and RNA analyses performed for * genes.   Based on Ms. Lomax's personal and family history of cancer, she meets medical criteria for genetic testing. Despite that she meets criteria, she may still have an out of pocket cost. We discussed that if her out of pocket cost for testing is over $100, the laboratory will call and confirm whether she wants to proceed with testing.  If the out of pocket cost of testing is less than $100 she will be billed by the genetic testing laboratory.   PLAN: After considering the risks, benefits, and limitations, Ms. Brummitt provided informed consent to pursue genetic testing and the blood sample was sent to [facility] for analysis of the  CancerNext-Expanded+RNAinsight. Results should be available within approximately 2-3 weeks' time, at which point they will be disclosed by telephone to Ms. Ferrell, as will any additional recommendations warranted by these results. Ms. Calmes will  receive a summary of her genetic counseling visit and a copy of her results once available. This information will also be available in Epic.   Lastly, we encouraged Ms. Sieloff to remain in contact with cancer genetics annually so that we can continuously update the family history and inform her of any changes in cancer genetics and testing that may be of benefit for this family.   Ms. Will questions were answered to her satisfaction today. Our contact information was provided should additional questions or concerns arise. Thank you for the referral and allowing [place] to share in the care of your patient.   Younique Casad P. Florene Glen, [place], [facility] Licensed, Insurance risk surveyor [email or web address] phone: [phone]  The patient was seen for a total of 25 minutes in face-to-face genetic counseling.  The patient was seen alone.  This patient was discussed with Drs. Magrinat, Lindi Adie and/or Burr Medico who agrees with the above.    _______________________________________________________________________ For Office Staff:  Number of people involved in session: 1 Was an Intern/ student involved with case: no

## 2021-07-27 NOTE — Progress Notes (Signed)
Pt had blood return from port, but not enough for lab draw. Pt had to get peripheral draw from lab today. Pt is aware that she may get cathflo on next infusion appointment.

## 2021-07-28 ENCOUNTER — Encounter: Payer: Self-pay | Admitting: Hematology

## 2021-08-10 ENCOUNTER — Ambulatory Visit: Payer: Self-pay | Admitting: Genetic Counselor

## 2021-08-10 ENCOUNTER — Encounter: Payer: Self-pay | Admitting: Genetic Counselor

## 2021-08-10 ENCOUNTER — Telehealth: Payer: Self-pay | Admitting: Genetic Counselor

## 2021-08-10 DIAGNOSIS — C50911 Malignant neoplasm of unspecified site of right female breast: Secondary | ICD-10-CM

## 2021-08-10 DIAGNOSIS — Z1379 Encounter for other screening for genetic and chromosomal anomalies: Secondary | ICD-10-CM | POA: Insufficient documentation

## 2021-08-10 NOTE — Progress Notes (Signed)
HPI:  [name] was previously seen in the [place] clinic due to a personal and family history of breast cancer and concerns regarding a hereditary predisposition to cancer. Please refer to our prior cancer genetics clinic note for more information regarding our discussion, assessment and recommendations, at the time. [name] recent genetic test results were disclosed to her, as were recommendations warranted by these results. These results and recommendations are discussed in more detail below.  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 MRI breast on 07/16/2014 showed 7cm R breast lesion and 4.4cm r axillary node    07/02/2014 Imaging   CT CAP: a 4.7cm mass in LUL lung and a 2.1cm mas in RML, and a small nodule in RUL, suspecious for metastasis     07/09/2014 Initial Diagnosis   right IDA with b/l lung lesions, ER-/PR-/HER2+   07/09/2014 Initial Biopsy   US guided right breast mass and axillary node biopsy showed IDA, and DCIS, ER-/PR-/HER2+   07/26/2014 Pathologic Stage   Left lung mass by IR, path revealed high grade carcinoma, morphology similar to breast tumor biopsy, TTF(-), NapsinA(-), ER(-)   08/04/2014 - 03/22/2015 Chemotherapy   weekly Paclitaxel 27m/m2, trastuzumab and pertuzumab every 3 weeks   08/30/2014 Genetic Testing   BreastNext panel was negative. 17 genes including BRCA1, BRCA2, were negative for mutations.    10/04/2014 Imaging   Interval decrease in the right axillary lymphadenopathy. Bilateral pulmonary lesions with left hilar lymphadenopathy also markedly decreased in the interval. The left hilar lymphadenopathy has resolved.   12/20/2014 Imaging    restaging CT showed stable disease, no new lesions    03/29/2015 Pathology Results    right breast lumpectomy showed  chemotherapy treatment effect,  a 1 mm residual tumor,   margins were widely negative, 5 sentinel lymph nodes and 2 axillary lymph nodes were negative.    03/29/2015 Surgery    right breast lumpectomy and sentinel lymph node biopsy  by Dr. BBarry Dienes  04/12/2015 -  Chemotherapy   Herceptin maintenance therapy , 6 mg/kg, every 3 weeks since 04/12/15. Switched to Herceptin Hylecta injection q3weeks after 06/10/19.   05/03/2015 - 06/14/2015 Radiation Therapy   right breast adjuvant irradiation by Dr. MValere Dross   08/28/2016 Imaging   CT CAP w Contrast  IMPRESSION: 1. No acute process or evidence of metastatic disease within the chest, abdomen, or pelvis. 2. Similar to less well-defined left upper lobe density, likely scarring. No evidence of new or progressive pulmonary metastasis. 3. Right nephrolithiasis.   01/22/2017 Imaging   CT Chest W Contrast 01/22/17 IMPRESSION: 1. Stable exam. No new or progressive findings. No evidence for metastatic disease. 2. Left upper lobe architectural distortion/scarring is stable. 3. Nonobstructing right renal stone.   07/31/2017 Imaging   Bone Scan Whole Body 07/31/17  IMPRESSION: 1. No scintigraphic evidence of osseous metastatic disease. 2. Thoracolumbar scoliosis.    07/31/2017 Imaging   CT CAP W Contrast 07/31/17 IMPRESSION: 1. No findings of active or recurrent malignancy. 2. Other imaging findings of potential clinical significance: Aortic Atherosclerosis (ICD10-I70.0). Mild cardiomegaly. Postoperative and radiation therapy findings in the right chest. Nonobstructive right nephrolithiasis. Thoracolumbar scoliosis.  01/20/2018 Echocardiogram   ECHO 01/20/2018 Study Conclusions   - Left ventricle: The cavity size was normal. Wall thickness was   normal. Systolic function was normal. The estimated ejection   fraction was in  the range of 55% to 60%. Wall motion was normal;   there were no regional wall motion abnormalities. Doppler   parameters are consistent with abnormal left ventricular   relaxation (grade 1 diastolic dysfunction). - Impressions: GLS -20.7% LS&' 10.4 cm.    02/17/2018 Imaging   02/17/2018 CT CAP IMPRESSION: 1. No evidence for residual or recurrent tumor or metastatic disease. 2.  Aortic Atherosclerosis (ICD10-I70.0). 3. Right renal calculi. 4. Scoliosis.   08/11/2018 Imaging   CT CAP W Contrast 08/11/18  IMPRESSION: No evidence for localized recurrence or metastatic disease in the chest, abdomen or pelvis.   02/23/2019 Imaging   CT CAP W Contrast  IMPRESSION: 1. No findings of active malignancy in the chest, abdomen, or pelvis. 2. 3 mm right kidney lower pole nonobstructive renal calculus. 3. Thoracolumbar scoliosis.   12/08/2019 Imaging   CT CAP W contrast  IMPRESSION: 1. Unchanged post treatment appearance of the lungs. No evidence of recurrent or new metastatic disease in the chest, abdomen, or pelvis.   2. Postoperative findings of right lumpectomy and axillary lymph node dissection.   3.  Nonobstructive right nephrolithiasis.   11/17/2020 Imaging   CT CAP IMPRESSION: 1. No evidence of recurrence or new metastatic disease within the chest, abdomen, or pelvis. 2. Similar post treatment changes in the lungs and postoperative findings of right lumpectomy and axillary lymph node dissection. 3. Colonic diverticulosis without findings of acute diverticulitis.   08/10/2021 Genetic Testing   Negative genetic testing on the CancerNext-Expanded+RNAinsight panel.  The report date is August 10, 2021.  The CancerNext-Expanded gene panel offered by [facility] and includes sequencing and rearrangement analysis for the following 77 genes: AIP, ALK, APC*, ATM*, AXIN2, BAP1, BARD1, BLM, BMPR1A, BRCA1*, BRCA2*, BRIP1*, CDC73, CDH1*, CDK4, CDKN1B, CDKN2A, CHEK2*, CTNNA1, DICER1,  FANCC, FH, FLCN, GALNT12, KIF1B, LZTR1, MAX, MEN1, MET, MLH1*, MSH2*, MSH3, MSH6*, MUTYH*, NBN, NF1*, NF2, NTHL1, PALB2*, PHOX2B, PMS2*, POT1, PRKAR1A, PTCH1, PTEN*, RAD51C*, RAD51D*, RB1, RECQL, RET, SDHA, SDHAF2, SDHB, SDHC, SDHD, SMAD4, SMARCA4, SMARCB1, SMARCE1, STK11, SUFU, TMEM127, TP53*, TSC1, TSC2, VHL and XRCC2 (sequencing and deletion/duplication); EGFR, EGLN1, HOXB13, KIT, MITF, PDGFRA, POLD1, and POLE (sequencing only); EPCAM and GREM1 (deletion/duplication only). DNA and RNA analyses performed for * genes.      FAMILY HISTORY:  We obtained a detailed, 4-generation family history.  Significant diagnoses are listed below: Family History  Problem Relation Age of Onset   Breast cancer Mother 48   Breast cancer Maternal Aunt 44   Breast cancer Maternal Aunt 59   Prostate cancer Maternal Uncle 73   Prostate cancer Maternal Uncle 68   Prostate cancer Maternal Grandfather 76   Liver cancer Paternal Grandmother    Prostate cancer Paternal Grandfather    Breast cancer Cousin 6       mat first cousin      The patient has a son and daughter who are cancer free.  She is an only child.  Her mother is living and her father is deceased.   The patient's mother was diagnosed with breast cancer at 17.  She has two brothers and two sisters.  Both brothers had prostate cancer and both sisters had breast cancer.  One sister had a daughter who had breast cancer.  The maternal grandparents are deceased.  The grandfather died of prostate cancer.   The patient's father is deceased.  She was estranged from her father's family, but she did know his parents.  They are deceased, the grandfather from prostate cancer and grandmother from liver cancer.   [name] is aware of previous family history of genetic testing for hereditary cancer risks. Patient's maternal ancestors are of African American descent, and paternal ancestors are of African American descent. There is no reported Ashkenazi Jewish  ancestry. There is no known consanguinity.  GENETIC TEST RESULTS: Genetic testing reported out on August 10, 2021 through the CancerNext-Expanded+RNAinsight cancer panel found no pathogenic mutations. The CancerNext-Expanded gene panel offered by [facility] and includes sequencing and rearrangement analysis for the following 77 genes: AIP, ALK, APC*, ATM*, AXIN2, BAP1, BARD1, BLM, BMPR1A, BRCA1*, BRCA2*, BRIP1*, CDC73, CDH1*, CDK4, CDKN1B, CDKN2A, CHEK2*, CTNNA1, DICER1, FANCC, FH, FLCN, GALNT12, KIF1B, LZTR1, MAX, MEN1, MET, MLH1*, MSH2*, MSH3, MSH6*, MUTYH*, NBN, NF1*, NF2, NTHL1, PALB2*, PHOX2B, PMS2*, POT1, PRKAR1A, PTCH1, PTEN*, RAD51C*, RAD51D*, RB1, RECQL, RET, SDHA, SDHAF2, SDHB, SDHC, SDHD, SMAD4, SMARCA4, SMARCB1, SMARCE1, STK11, SUFU, TMEM127, TP53*, TSC1, TSC2, VHL and XRCC2 (sequencing and deletion/duplication); EGFR, EGLN1, HOXB13, KIT, MITF, PDGFRA, POLD1, and POLE (sequencing only); EPCAM and GREM1 (deletion/duplication only). DNA and RNA analyses performed for * genes. The test report has been scanned into EPIC and is located under the Molecular Pathology section of the Results Review tab.  A portion of the result report is included below for reference.     We discussed with [name] that because current genetic testing is not perfect, it is possible there may be a gene mutation in one of these genes that current testing cannot detect, but that chance is small.  We also discussed, that there could be another gene that has not yet been discovered, or that we have not yet tested, that is responsible for the cancer diagnoses in the family. It is also possible there is a hereditary cause for the cancer in the family that [name] did not inherit and therefore was not identified in her testing.  Therefore, it is important to remain in touch with cancer genetics in the future so that we can continue to offer [name] the most up to date genetic testing.   ADDITIONAL GENETIC TESTING:  We discussed with [name] that her genetic testing was fairly extensive.  If there are genes identified to increase cancer risk that can be analyzed in the future, we would be happy to discuss and coordinate this testing at that time.    CANCER SCREENING RECOMMENDATIONS: [name] test result is considered negative (normal).  This means that we have not identified a hereditary cause for her personal and family history of breast cancer at this time. Most cancers happen by chance and this negative test suggests that her cancer may fall into this category.    While reassuring, this does not definitively rule out a hereditary predisposition to cancer. It is still possible that there could be genetic mutations that are undetectable by current technology. There could be genetic mutations in genes that have not been tested or identified to increase cancer risk.  Therefore, it is recommended she continue to follow the cancer management and screening guidelines provided by her oncology and primary healthcare provider.   An individual's cancer risk and medical management are not determined by genetic test results alone. Overall cancer risk assessment incorporates additional factors, including personal medical history, family history, and any available genetic information  that may result in a personalized plan for cancer prevention and surveillance  RECOMMENDATIONS FOR FAMILY MEMBERS:  Individuals in this family might be at some increased risk of developing cancer, over the general population risk, simply due to the family history of cancer.  We recommended women in this family have a yearly mammogram beginning at age 20, or 40 years younger than the earliest onset of cancer, an annual clinical breast exam, and perform monthly breast self-exams. Women in this family should also have a gynecological exam as recommended by their primary provider. All family members should be referred for colonoscopy starting at  age 73.  FOLLOW-UP: Lastly, we discussed with [name] that cancer genetics is a rapidly advancing field and it is possible that new genetic tests will be appropriate for her and/or her family members in the future. We encouraged her to remain in contact with cancer genetics on an annual basis so we can update her personal and family histories and let her know of advances in cancer genetics that may benefit this family.   Our contact number was provided. [name] questions were answered to her satisfaction, and she knows she is welcome to call us at anytime with additional questions or concerns.   Roma Kayser, [place], [facility] Licensed, Certified Genetic Counselor [email or web address]

## 2021-08-10 NOTE — Telephone Encounter (Signed)
Revealed negative genetic testing.  Discussed that we do not know why she has breast cancer or why there is breast cancer in the family. It could be due to a different gene that we are not testing, or maybe our current technology may not be able to pick something up.  It will be important for her to keep in contact with genetics to keep up with whether additional testing may be needed.

## 2021-08-17 ENCOUNTER — Inpatient Hospital Stay: Payer: BC Managed Care – PPO | Attending: Hematology

## 2021-08-17 ENCOUNTER — Other Ambulatory Visit: Payer: Self-pay

## 2021-08-17 VITALS — BP 134/73 | HR 85 | Temp 99.3°F | Resp 18

## 2021-08-17 DIAGNOSIS — C50311 Malignant neoplasm of lower-inner quadrant of right female breast: Secondary | ICD-10-CM | POA: Diagnosis present

## 2021-08-17 DIAGNOSIS — Z5112 Encounter for antineoplastic immunotherapy: Secondary | ICD-10-CM | POA: Insufficient documentation

## 2021-08-17 DIAGNOSIS — C50911 Malignant neoplasm of unspecified site of right female breast: Secondary | ICD-10-CM

## 2021-08-17 MED ORDER — TRASTUZUMAB-HYALURONIDASE-OYSK 600-10000 MG-UNT/5ML ~~LOC~~ SOLN
600.0000 mg | Freq: Once | SUBCUTANEOUS | Status: AC
  Administered 2021-08-17: 600 mg via SUBCUTANEOUS
  Filled 2021-08-17: qty 5

## 2021-08-18 ENCOUNTER — Encounter: Payer: Self-pay | Admitting: Hematology

## 2021-09-04 ENCOUNTER — Encounter: Payer: Self-pay | Admitting: Hematology

## 2021-09-07 ENCOUNTER — Inpatient Hospital Stay: Payer: BC Managed Care – PPO

## 2021-09-07 ENCOUNTER — Other Ambulatory Visit: Payer: Self-pay

## 2021-09-07 VITALS — BP 135/80 | HR 67 | Temp 99.4°F | Resp 20 | Wt 224.0 lb

## 2021-09-07 DIAGNOSIS — C50911 Malignant neoplasm of unspecified site of right female breast: Secondary | ICD-10-CM

## 2021-09-07 DIAGNOSIS — Z95828 Presence of other vascular implants and grafts: Secondary | ICD-10-CM

## 2021-09-07 DIAGNOSIS — Z5112 Encounter for antineoplastic immunotherapy: Secondary | ICD-10-CM | POA: Diagnosis not present

## 2021-09-07 DIAGNOSIS — C7800 Secondary malignant neoplasm of unspecified lung: Secondary | ICD-10-CM

## 2021-09-07 LAB — CBC WITH DIFFERENTIAL/PLATELET
Abs Immature Granulocytes: 0.01 10*3/uL (ref 0.00–0.07)
Basophils Absolute: 0 10*3/uL (ref 0.0–0.1)
Basophils Relative: 0 %
Eosinophils Absolute: 0.1 10*3/uL (ref 0.0–0.5)
Eosinophils Relative: 1 %
HCT: 38.5 % (ref 36.0–46.0)
Hemoglobin: 12.8 g/dL (ref 12.0–15.0)
Immature Granulocytes: 0 %
Lymphocytes Relative: 42 %
Lymphs Abs: 2.4 10*3/uL (ref 0.7–4.0)
MCH: 29.8 pg (ref 26.0–34.0)
MCHC: 33.2 g/dL (ref 30.0–36.0)
MCV: 89.5 fL (ref 80.0–100.0)
Monocytes Absolute: 0.4 10*3/uL (ref 0.1–1.0)
Monocytes Relative: 8 %
Neutro Abs: 2.9 10*3/uL (ref 1.7–7.7)
Neutrophils Relative %: 49 %
Platelets: 260 10*3/uL (ref 150–400)
RBC: 4.3 MIL/uL (ref 3.87–5.11)
RDW: 12.5 % (ref 11.5–15.5)
WBC: 5.9 10*3/uL (ref 4.0–10.5)
nRBC: 0 % (ref 0.0–0.2)

## 2021-09-07 LAB — COMPREHENSIVE METABOLIC PANEL
ALT: 27 U/L (ref 0–44)
AST: 19 U/L (ref 15–41)
Albumin: 4.2 g/dL (ref 3.5–5.0)
Alkaline Phosphatase: 64 U/L (ref 38–126)
Anion gap: 4 — ABNORMAL LOW (ref 5–15)
BUN: 16 mg/dL (ref 6–20)
CO2: 30 mmol/L (ref 22–32)
Calcium: 9.7 mg/dL (ref 8.9–10.3)
Chloride: 104 mmol/L (ref 98–111)
Creatinine, Ser: 0.89 mg/dL (ref 0.44–1.00)
GFR, Estimated: >60 mL/min (ref >60)
Glucose, Bld: 83 mg/dL (ref 70–99)
Potassium: 4.6 mmol/L (ref 3.5–5.1)
Sodium: 138 mmol/L (ref 135–145)
Total Bilirubin: 0.4 mg/dL (ref 0.3–1.2)
Total Protein: 8.2 g/dL — ABNORMAL HIGH (ref 6.5–8.1)

## 2021-09-07 MED ORDER — SODIUM CHLORIDE 0.9% FLUSH
10.0000 mL | Freq: Once | INTRAVENOUS | Status: AC
  Administered 2021-09-07: 10 mL

## 2021-09-07 MED ORDER — TRASTUZUMAB-HYALURONIDASE-OYSK 600-10000 MG-UNT/5ML ~~LOC~~ SOLN
600.0000 mg | Freq: Once | SUBCUTANEOUS | Status: AC
  Administered 2021-09-07: 600 mg via SUBCUTANEOUS
  Filled 2021-09-07: qty 5

## 2021-09-27 ENCOUNTER — Other Ambulatory Visit: Payer: Self-pay | Admitting: Hematology

## 2021-09-27 ENCOUNTER — Telehealth: Payer: Self-pay

## 2021-09-27 NOTE — Telephone Encounter (Signed)
Pt called stating her husband tested positive for COVID.  Pt stated she tested negative but is symptomatic.  Pt stated she has tested negative twice.  Pt described her symptoms as sinus relayed but does have muscle aches.  Instructed pt to retest for COVID on Friday because she maybe testing a false negative right now.  Instructed pt to start taking Mucinex D or DM along with Robitussin DM for her cough.  Drink plenty of cold clear liquids and take Tylenol for any fevers.  Keep check on her oxygen levels.  Pt verbalized understanding of instructions.  Pt stated she will need to cancel her appt with Dr. Burr Medico and infusion scheduled on 09/28/2021.  Canceled appointments in [place].  Pt will call back on Friday 09/29/2021 with Covid Retest results.  If pt is negative will have pt rescheduled for missed appts this week to next week.  If pt is positive will have pt rescheduled in 3 weeks and Dr. Burr Medico may possibly prescribe Abx for Covid.  Pt had no further question or concerns at this time.

## 2021-09-28 ENCOUNTER — Inpatient Hospital Stay: Payer: BC Managed Care – PPO

## 2021-09-28 ENCOUNTER — Inpatient Hospital Stay: Payer: BC Managed Care – PPO | Admitting: Hematology

## 2021-10-04 ENCOUNTER — Telehealth: Payer: Self-pay

## 2021-10-04 NOTE — Telephone Encounter (Signed)
Pt called stating she tested (+) for Covid on 09/29/2021 and wanted to know when could she resume her treatment Herceptin Hylecta SubQ injection.  Informed pt that [facility] hospital policy states that the pt may resume treatment 3 weeks from the day she tested positive and must have 2 (-) test prior to resuming treatment.  Based on pt's date of testing (+), the pt can resume treatment on 10/19/2021 or 10/20/2021.  Pt stated she's feeling much better and denied SOB or any fevers.  Sent message to scheduling to schedule the pt for 10/19/2021 for treatment and f/u with Cira Rue, NP and infusion.

## 2021-10-06 ENCOUNTER — Telehealth: Payer: Self-pay | Admitting: Hematology

## 2021-10-06 NOTE — Telephone Encounter (Signed)
.  Called patient to schedule appointment per 2/22 inbasket, patient is aware of date and time.

## 2021-10-17 NOTE — Progress Notes (Signed)
[facility] OFFICE PROGRESS NOTE  [place], [place], [place] [place] [address] [place] [address]  DIAGNOSIS: Follow up right breast cancer  Oncology History Overview Note  Breast cancer 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   Staging form: Breast, AJCC 7th Edition     Clinical stage from 07/22/2014: Stage IV (T3, N1, M1) - Unsigned     Breast cancer 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 ([place])  07/02/2014 Mammogram   Mammogram showed a 2cm right beast mass and a 1.8cm right axillary node. MRI breast on 07/16/2014 showed 7cm R breast lesion and 4.4cm r axillary node    07/02/2014 Imaging   CT CAP: a 4.7cm mass in LUL lung and a 2.1cm mas in RML, and a small nodule in RUL, suspecious for metastasis     07/09/2014 Initial Diagnosis   right IDA with b/l lung lesions, ER-/PR-/HER2+   07/09/2014 Initial Biopsy   US guided right breast mass and axillary node biopsy showed IDA, and DCIS, ER-/PR-/HER2+   07/26/2014 Pathologic Stage   Left lung mass by IR, path revealed high grade carcinoma, morphology similar to breast tumor biopsy, TTF(-), NapsinA(-), ER(-)   08/04/2014 - 03/22/2015 Chemotherapy   weekly Paclitaxel $RemoveBeforeDEI'80mg'TrjldVumlFQzdyug$ /m2, trastuzumab and pertuzumab every 3 weeks   08/30/2014 Genetic Testing   BreastNext panel was negative. 17 genes including BRCA1, BRCA2, were negative for mutations.    10/04/2014 Imaging   Interval decrease in the right axillary lymphadenopathy. Bilateral pulmonary lesions with left hilar lymphadenopathy also markedly decreased in the interval. The left hilar lymphadenopathy has resolved.   12/20/2014 Imaging   restaging CT showed stable disease, no new lesions    03/29/2015 Pathology Results    right breast lumpectomy showed  chemotherapy treatment effect,  a 1 mm residual tumor,   margins were widely negative, 5 sentinel lymph nodes and 2 axillary lymph nodes were negative.    03/29/2015 Surgery    right breast lumpectomy and sentinel lymph node biopsy  by Dr. Barry Dienes    04/12/2015 -  Chemotherapy   Herceptin maintenance therapy , 6 mg/kg, every 3 weeks since 04/12/15. Switched to Herceptin Hylecta injection q3weeks after 06/10/19.   05/03/2015 - 06/14/2015 Radiation Therapy   right breast adjuvant irradiation by Dr. Valere Dross    08/28/2016 Imaging   CT CAP w Contrast  IMPRESSION: 1. No acute process or evidence of metastatic disease within the chest, abdomen, or pelvis. 2. Similar to less well-defined left upper lobe density, likely scarring. No evidence of new or progressive pulmonary metastasis. 3. Right nephrolithiasis.   01/22/2017 Imaging   CT Chest W Contrast 01/22/17 IMPRESSION: 1. Stable exam. No new or progressive findings. No evidence for metastatic disease. 2. Left upper lobe architectural distortion/scarring is stable. 3. Nonobstructing right renal stone.   07/31/2017 Imaging   Bone Scan Whole Body 07/31/17  IMPRESSION: 1. No scintigraphic evidence of osseous metastatic disease. 2. Thoracolumbar scoliosis.    07/31/2017 Imaging   CT CAP W Contrast 07/31/17 IMPRESSION: 1. No findings of active or recurrent malignancy. 2. Other imaging findings of potential clinical significance: Aortic Atherosclerosis (ICD10-I70.0). Mild cardiomegaly. Postoperative and radiation therapy findings in the right chest. Nonobstructive right nephrolithiasis. Thoracolumbar scoliosis.     01/20/2018 Echocardiogram   ECHO 01/20/2018 Study Conclusions   - Left ventricle: The cavity size was normal. Wall thickness was   normal. Systolic function was normal. The estimated ejection   fraction was in the range of 55% to 60%. Wall motion was normal;   there were no regional  wall motion abnormalities. Doppler   parameters are consistent with abnormal left ventricular   relaxation (grade 1 diastolic dysfunction). - Impressions: GLS -20.7% LS&' 10.4 cm.    02/17/2018 Imaging   02/17/2018 CT CAP IMPRESSION: 1. No evidence for residual or recurrent tumor or  metastatic disease. 2.  Aortic Atherosclerosis (ICD10-I70.0). 3. Right renal calculi. 4. Scoliosis.   08/11/2018 Imaging   CT CAP W Contrast 08/11/18  IMPRESSION: No evidence for localized recurrence or metastatic disease in the chest, abdomen or pelvis.   02/23/2019 Imaging   CT CAP W Contrast  IMPRESSION: 1. No findings of active malignancy in the chest, abdomen, or pelvis. 2. 3 mm right kidney lower pole nonobstructive renal calculus. 3. Thoracolumbar scoliosis.   12/08/2019 Imaging   CT CAP W contrast  IMPRESSION: 1. Unchanged post treatment appearance of the lungs. No evidence of recurrent or new metastatic disease in the chest, abdomen, or pelvis.   2. Postoperative findings of right lumpectomy and axillary lymph node dissection.   3.  Nonobstructive right nephrolithiasis.   11/17/2020 Imaging   CT CAP IMPRESSION: 1. No evidence of recurrence or new metastatic disease within the chest, abdomen, or pelvis. 2. Similar post treatment changes in the lungs and postoperative findings of right lumpectomy and axillary lymph node dissection. 3. Colonic diverticulosis without findings of acute diverticulitis.   08/10/2021 Genetic Testing   Negative genetic testing on the CancerNext-Expanded+RNAinsight panel.  The report date is August 10, 2021.  The CancerNext-Expanded gene panel offered by [facility] and includes sequencing and rearrangement analysis for the following 77 genes: AIP, ALK, APC*, ATM*, AXIN2, BAP1, BARD1, BLM, BMPR1A, BRCA1*, BRCA2*, BRIP1*, CDC73, CDH1*, CDK4, CDKN1B, CDKN2A, CHEK2*, CTNNA1, DICER1, FANCC, FH, FLCN, GALNT12, KIF1B, LZTR1, MAX, MEN1, MET, MLH1*, MSH2*, MSH3, MSH6*, MUTYH*, NBN, NF1*, NF2, NTHL1, PALB2*, PHOX2B, PMS2*, POT1, PRKAR1A, PTCH1, PTEN*, RAD51C*, RAD51D*, RB1, RECQL, RET, SDHA, SDHAF2, SDHB, SDHC, SDHD, SMAD4, SMARCA4, SMARCB1, SMARCE1, STK11, SUFU, TMEM127, TP53*, TSC1, TSC2, VHL and XRCC2 (sequencing and deletion/duplication); EGFR,  EGLN1, HOXB13, KIT, MITF, PDGFRA, POLD1, and POLE (sequencing only); EPCAM and GREM1 (deletion/duplication only). DNA and RNA analyses performed for * genes.      CURRENT THERAPY: Maintenance Herceptin every 3 weeks since 04/12/15. Switched to Herceptin Hylecta injection q3weeks after 06/10/19.  INTERVAL HISTORY: [name] [name] 51 y.o. female returns to the clinic today for a follow-up visit.  The patient was last seen in clinic on 07/05/2021.  In the interval since last being seen, the patient was reevaluated by genetic counseling to reassess hereditary predisposition for cancer.  Her testing found that she is negative for any known hereditary predisposition to cancer based on the genes tested.  She also completed a mammogram in December 2022 at [facility] which did not show any mammographic evidence of malignancy.  Recommended follow-up mammogram in 1 year.  In February 2023, the patient was diagnosed with COVID-19.  She recovered well from this but her treatments had to be rescheduled until today.   Overall she is doing well today. Besides having COVID-19 last month, she denies any fever, chills, night sweats, or unexplained weight loss.  Her last echo was in October 2022.  She is scheduled for her next echo on 11/22/21. She denies any shortness of breath, cough, or hemoptysis.  Denies any new breast masses, nipple discharge, overlying skin changes, or nipple inversion.  Denies any new pain.  For peripheral neuropathy, she intermittently gets flares. She reports this is similar to prior. It is not painful  but reports it is uncomfortable/a nuisance. She reports tingling. More often than not, it resolves spontaneously. Every once in awhile, she will take 2 gabapentin if needed.    She is here today for evaluation and repeat blood work before undergoing her next cycle of treatment.   MEDICAL HISTORY: Past Medical History:  Diagnosis Date   Breast cancer ([place])    Breast cancer ([place])    CHF (congestive  heart failure) ([place])    Family history of breast cancer    Family history of prostate cancer    GERD (gastroesophageal reflux disease)    during pregnancy    Pneumonia    hx of pneumonia 08/2013    S/P radiation therapy 05/03/2015 through 06/14/2015                                                      Right breast 4680 cGy in 26 sessions, right breast boost 1000 cGy in 5 sessions.  Right supraclavicular/axillary region 4680 cGy with a supplemental PA field to bring the axillary dose up to 4500 cGy in 26 sessions    ALLERGIES:  is allergic to adhesive [tape], aspirin, and codeine.  MEDICATIONS:  Current Outpatient Medications  Medication Sig Dispense Refill   acetaminophen (TYLENOL) 500 MG tablet Take 500 mg by mouth every 6 (six) hours as needed.     Ascorbic Acid (VITAMIN C) 500 MG CHEW      Calcium-Phosphorus-Vitamin D (CALCIUM GUMMIES PO) Take by mouth daily.     Camphor-Eucalyptus-Menthol (VICKS VAPORUB EX) Apply 1 application topically at bedtime. Applies under nose, throat and on chest.     cetirizine (ZYRTEC) 10 MG tablet Take 10 mg by mouth daily as needed for allergies (allergies).      Fluocinolone Acetonide Body 0.01 % OIL Apply topically 4 (four) times a week.     fluticasone (FLONASE) 50 MCG/ACT nasal spray Place into both nostrils daily.     gabapentin (NEURONTIN) 100 MG capsule TAKE 2 CAPSULES(200 MG) BY MOUTH AT BEDTIME 60 capsule 3   hydrocortisone 2.5 % cream Apply 1 application topically 2 (two) times daily as needed.     ibuprofen (ADVIL,MOTRIN) 800 MG tablet Take 1 tablet (800 mg total) by mouth every 8 (eight) hours as needed. 60 tablet 1   lidocaine-prilocaine (EMLA) cream Apply as directed prior to chemotherapy 30 g 2   loperamide (IMODIUM A-D) 2 MG tablet Take 1 tablet (2 mg total) by mouth 4 (four) times daily as needed for diarrhea or loose stools. 30 tablet 0   losartan (COZAAR) 25 MG tablet TAKE 1 TABLET(25 MG) BY MOUTH DAILY 90 tablet 3   minoxidil (LONITEN)  2.5 MG tablet Take 2.5 mg by mouth daily. 1/2 tab once daily     Multiple Vitamins-Minerals (MULTIVITAMIN GUMMIES ADULT PO) Take 1 each by mouth daily. Women's Vitafusion gummie     spironolactone (ALDACTONE) 25 MG tablet Take 1 tablet (25 mg total) by mouth daily. 90 tablet 3   No current facility-administered medications for this visit.   Facility-Administered Medications Ordered in Other Visits  Medication Dose Route Frequency Provider Last Rate Last Admin   acetaminophen (TYLENOL) tablet 650 mg  650 mg Oral Once [name], MD       heparin lock flush 100 unit/mL  500 Units Intracatheter Once PRN [name], MD  sodium chloride 0.9 % injection 10 mL  10 mL Intracatheter PRN [name], MD   10 mL at 05/01/16 1622   sodium chloride 0.9 % injection 10 mL  10 mL Intravenous PRN [name], MD       sodium chloride 0.9 % injection 10 mL  10 mL Intracatheter PRN [name], MD   10 mL at 03/18/19 1715   sodium chloride flush (NS) 0.9 % injection 10 mL  10 mL Intravenous PRN [name], MD        SURGICAL HISTORY:  Past Surgical History:  Procedure Laterality Date   BREAST LUMPECTOMY WITH RADIOACTIVE SEED AND SENTINEL LYMPH NODE BIOPSY Right 03/29/2015   Procedure: BREAST LUMPECTOMY WITH RADIOACTIVE SEED AND SENTINEL LYMPH NODE BIOPSY;  Surgeon: Stark Klein, MD;  Location: [facility];  Service: General;  Laterality: Right;   CESAREAN SECTION     CHOLECYSTECTOMY     ESSURE TUBAL LIGATION     PORTACATH PLACEMENT Left 07/21/2014   Procedure: INSERTION PORT-A-CATH;  Surgeon: Stark Klein, MD;  Location: [facility];  Service: General;  Laterality: Left;   WISDOM TOOTH EXTRACTION      REVIEW OF SYSTEMS:   Review of Systems  Constitutional: Negative for appetite change, chills, fatigue, fever and unexpected weight change.  HENT:   Negative for mouth sores, nosebleeds, sore throat and trouble swallowing.   Eyes: Negative for eye problems and icterus.  Respiratory: Negative for cough,  hemoptysis, shortness of breath and wheezing.   Cardiovascular: Negative for chest pain and leg swelling.  Gastrointestinal: Negative for abdominal pain, constipation, diarrhea, nausea and vomiting.  Genitourinary: Negative for bladder incontinence, difficulty urinating, dysuria, frequency and hematuria.   Musculoskeletal: Negative for back pain, gait problem, neck pain and neck stiffness.  Skin: Negative for itching and rash.  Neurological: Positive for mild intermittent peripheral neuropathy. Negative for dizziness, extremity weakness, gait problem, headaches, light-headedness and seizures.  Hematological: Negative for adenopathy. Does not bruise/bleed easily.  Psychiatric/Behavioral: Negative for confusion, depression and sleep disturbance. The patient is not nervous/anxious.     PHYSICAL EXAMINATION:  Blood pressure 130/73, pulse 85, temperature (!) 97.4 F (36.3 C), temperature source Oral, resp. rate 18, height _0  (1.778 m), weight 228 lb 6.4 oz (103.6 kg), SpO2 100 %, unknown if currently breastfeeding.  ECOG PERFORMANCE STATUS: 0-1  Physical Exam  Constitutional: Oriented to person, place, and time and well-developed, well-nourished, and in no distress.   HENT:  Head: Normocephalic and atraumatic.  Mouth/Throat: Oropharynx is clear and moist. No oropharyngeal exudate.  Eyes: Conjunctivae are normal. Right eye exhibits no discharge. Left eye exhibits no discharge. No scleral icterus.  Neck: Normal range of motion. Neck supple.  Cardiovascular: Normal rate, regular rhythm, normal heart sounds and intact distal pulses.   Pulmonary/Chest: Effort normal and breath sounds normal. No respiratory distress. No wheezes. No rales.  Abdominal: Soft. Bowel sounds are normal. Exhibits no distension and no mass. There is no tenderness.  Musculoskeletal: Normal range of motion. Exhibits no edema.  Lymphadenopathy:    No cervical adenopathy.  Neurological: Alert and oriented to person,  place, and time. Exhibits normal muscle tone. Gait normal. Coordination normal.  Skin: Skin is warm and dry. No rash noted. Not diaphoretic. No erythema. No pallor.  Breasts: Breast inspection showed them to be symmetrical with no nipple discharge. Palpation of the breasts and axilla revealed no obvious mass that I could appreciate. Psychiatric: Mood, memory and judgment normal.  Vitals reviewed.  LABORATORY DATA:  Lab Results  Component Value Date   WBC 6.7 10/19/2021   HGB 11.6 (L) 10/19/2021   HCT 34.8 (L) 10/19/2021   MCV 88.8 10/19/2021   PLT 222 10/19/2021      Chemistry      Component Value Date/Time   NA 138 10/19/2021 0736   NA 141 08/01/2017 1305   K 3.9 10/19/2021 0736   K 3.6 08/01/2017 1305   CL 103 10/19/2021 0736   CO2 29 10/19/2021 0736   CO2 27 08/01/2017 1305   BUN 13 10/19/2021 0736   BUN 10.5 08/01/2017 1305   CREATININE 0.79 10/19/2021 0736   CREATININE 0.9 08/01/2017 1305      Component Value Date/Time   CALCIUM 9.0 10/19/2021 0736   CALCIUM 9.4 08/01/2017 1305   ALKPHOS 61 10/19/2021 0736   ALKPHOS 73 08/01/2017 1305   AST 8 (L) 10/19/2021 0736   AST 21 08/01/2017 1305   ALT 14 10/19/2021 0736   ALT 26 08/01/2017 1305   BILITOT 0.5 10/19/2021 0736   BILITOT 0.25 08/01/2017 1305       RADIOGRAPHIC STUDIES:  No results found.   ASSESSMENT/PLAN:  ALANNY RIVERS is a 51 y.o. female with    1. 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  -CT CAP 11/17/20 showed NED, repeat in 1 year. Will place new order today. She prefers to have this done a week before being seen next time to review at her next appointment. Discussed with LB. Since the patient is doing well without any  complaints, it is reasonable to arrange for her CT late May/early June. -Continues maintenance herceptin inj q3 weeks, tolerating well -annual mammograms in December. Next due in December 2023.  -Next echo 11/22/21 -F/u in 12 weeks.    2. HTN -05/2021 echo was normal, continue q6 months -f/up Dr. BHaroldine Laws  3. 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 Health maintenance -she is losing weight intentionally by healthy eating and physical activity -UTD on flu vac, can receive last covid booster -UTD on pap and colonoscopy, due mammo 07/2022   5. Irregular bleeding, h/o uterine polyps removed 2020 -She has had irregular periods since her chemo for breast cancer ended in 2016.  -s/p D&C 11/2018 with benign polyp  -she continues having irregular vaginal bleeding, most recent endometrial biopsy 06/2021 was benign -continue f[facility]   6. Genetic testing -Was retested by genetic counseling for hereditary disposition for cancer in December 2022.  She was negative.    Plan: -CT chest abdomen and pelvis before next visit. Order placed today. -Echocardiogram in April 2023 -Treatment every 3 weeks with follow-up every 12 weeks -Refilled gabapentin and EMLA cream  Orders Placed This Encounter  Procedures   CT CHEST ABDOMEN PELVIS W CONTRAST    Standing Status:   Future    Standing Expiration Date:   10/20/2022    Order Specific Question:   Is patient pregnant?    Answer:   No    Order Specific Question:   [facility]    [facility]   Order Specific Question:  Is Oral Contrast requested for this exam?    Answer:   Yes, Per Radiology protocol     The total time spent in the appointment was 20-29 minutes.   Cheril Slattery L Chanequa Spees, PA-C 10/19/21

## 2021-10-18 ENCOUNTER — Other Ambulatory Visit: Payer: Self-pay

## 2021-10-18 DIAGNOSIS — C50911 Malignant neoplasm of unspecified site of right female breast: Secondary | ICD-10-CM

## 2021-10-19 ENCOUNTER — Other Ambulatory Visit: Payer: Self-pay | Admitting: Hematology

## 2021-10-19 ENCOUNTER — Inpatient Hospital Stay: Payer: BC Managed Care – PPO

## 2021-10-19 ENCOUNTER — Other Ambulatory Visit: Payer: Self-pay

## 2021-10-19 ENCOUNTER — Inpatient Hospital Stay (HOSPITAL_BASED_OUTPATIENT_CLINIC_OR_DEPARTMENT_OTHER): Payer: BC Managed Care – PPO | Admitting: Physician Assistant

## 2021-10-19 ENCOUNTER — Other Ambulatory Visit: Payer: Self-pay | Admitting: *Deleted

## 2021-10-19 ENCOUNTER — Inpatient Hospital Stay: Payer: BC Managed Care – PPO | Attending: Hematology

## 2021-10-19 VITALS — BP 130/73 | HR 85 | Temp 97.4°F | Resp 18 | Ht 70.0 in | Wt 228.4 lb

## 2021-10-19 DIAGNOSIS — C7802 Secondary malignant neoplasm of left lung: Secondary | ICD-10-CM | POA: Diagnosis not present

## 2021-10-19 DIAGNOSIS — C50911 Malignant neoplasm of unspecified site of right female breast: Secondary | ICD-10-CM

## 2021-10-19 DIAGNOSIS — C7801 Secondary malignant neoplasm of right lung: Secondary | ICD-10-CM | POA: Insufficient documentation

## 2021-10-19 DIAGNOSIS — Z171 Estrogen receptor negative status [ER-]: Secondary | ICD-10-CM

## 2021-10-19 DIAGNOSIS — T451X5A Adverse effect of antineoplastic and immunosuppressive drugs, initial encounter: Secondary | ICD-10-CM

## 2021-10-19 DIAGNOSIS — C50511 Malignant neoplasm of lower-outer quadrant of right female breast: Secondary | ICD-10-CM

## 2021-10-19 DIAGNOSIS — G620 Drug-induced polyneuropathy: Secondary | ICD-10-CM | POA: Diagnosis not present

## 2021-10-19 DIAGNOSIS — C50311 Malignant neoplasm of lower-inner quadrant of right female breast: Secondary | ICD-10-CM | POA: Insufficient documentation

## 2021-10-19 DIAGNOSIS — Z5112 Encounter for antineoplastic immunotherapy: Secondary | ICD-10-CM | POA: Diagnosis not present

## 2021-10-19 DIAGNOSIS — I10 Essential (primary) hypertension: Secondary | ICD-10-CM | POA: Diagnosis not present

## 2021-10-19 DIAGNOSIS — C7800 Secondary malignant neoplasm of unspecified lung: Secondary | ICD-10-CM

## 2021-10-19 DIAGNOSIS — Z95828 Presence of other vascular implants and grafts: Secondary | ICD-10-CM

## 2021-10-19 LAB — CBC WITH DIFFERENTIAL (CANCER CENTER ONLY)
Abs Immature Granulocytes: 0.01 10*3/uL (ref 0.00–0.07)
Basophils Absolute: 0 10*3/uL (ref 0.0–0.1)
Basophils Relative: 0 %
Eosinophils Absolute: 0.1 10*3/uL (ref 0.0–0.5)
Eosinophils Relative: 1 %
HCT: 34.8 % — ABNORMAL LOW (ref 36.0–46.0)
Hemoglobin: 11.6 g/dL — ABNORMAL LOW (ref 12.0–15.0)
Immature Granulocytes: 0 %
Lymphocytes Relative: 24 %
Lymphs Abs: 1.6 10*3/uL (ref 0.7–4.0)
MCH: 29.6 pg (ref 26.0–34.0)
MCHC: 33.3 g/dL (ref 30.0–36.0)
MCV: 88.8 fL (ref 80.0–100.0)
Monocytes Absolute: 0.4 10*3/uL (ref 0.1–1.0)
Monocytes Relative: 6 %
Neutro Abs: 4.6 10*3/uL (ref 1.7–7.7)
Neutrophils Relative %: 69 %
Platelet Count: 222 10*3/uL (ref 150–400)
RBC: 3.92 MIL/uL (ref 3.87–5.11)
RDW: 13.1 % (ref 11.5–15.5)
WBC Count: 6.7 10*3/uL (ref 4.0–10.5)
nRBC: 0 % (ref 0.0–0.2)

## 2021-10-19 LAB — CMP (CANCER CENTER ONLY)
ALT: 14 U/L (ref 0–44)
AST: 8 U/L — ABNORMAL LOW (ref 15–41)
Albumin: 3.9 g/dL (ref 3.5–5.0)
Alkaline Phosphatase: 61 U/L (ref 38–126)
Anion gap: 6 (ref 5–15)
BUN: 13 mg/dL (ref 6–20)
CO2: 29 mmol/L (ref 22–32)
Calcium: 9 mg/dL (ref 8.9–10.3)
Chloride: 103 mmol/L (ref 98–111)
Creatinine: 0.79 mg/dL (ref 0.44–1.00)
GFR, Estimated: >60 mL/min (ref >60)
Glucose, Bld: 134 mg/dL — ABNORMAL HIGH (ref 70–99)
Potassium: 3.9 mmol/L (ref 3.5–5.1)
Sodium: 138 mmol/L (ref 135–145)
Total Bilirubin: 0.5 mg/dL (ref 0.3–1.2)
Total Protein: 7.4 g/dL (ref 6.5–8.1)

## 2021-10-19 MED ORDER — TRASTUZUMAB-HYALURONIDASE-OYSK 600-10000 MG-UNT/5ML ~~LOC~~ SOLN
600.0000 mg | Freq: Once | SUBCUTANEOUS | Status: AC
  Administered 2021-10-19: 10:00:00 600 mg via SUBCUTANEOUS
  Filled 2021-10-19: qty 5

## 2021-10-19 MED ORDER — SODIUM CHLORIDE 0.9 % IV SOLN: Freq: Once | INTRAVENOUS | Status: DC

## 2021-10-19 MED ORDER — GABAPENTIN 100 MG PO CAPS: ORAL_CAPSULE | ORAL | 3 refills | Status: DC

## 2021-10-19 MED ORDER — SODIUM CHLORIDE 0.9% FLUSH
10.0000 mL | Freq: Once | INTRAVENOUS | Status: AC
  Administered 2021-10-19: 08:00:00 10 mL

## 2021-10-19 MED ORDER — HEPARIN SOD (PORK) LOCK FLUSH 100 UNIT/ML IV SOLN
500.0000 [IU] | Freq: Once | INTRAVENOUS | Status: AC | PRN
  Administered 2021-10-19: 08:00:00 500 [IU] via INTRAVENOUS

## 2021-10-19 MED ORDER — LIDOCAINE-PRILOCAINE 2.5-2.5 % EX CREA: TOPICAL_CREAM | CUTANEOUS | 2 refills | Status: DC

## 2021-10-19 NOTE — Patient Instructions (Signed)
Trastuzumab; Hyaluronidase injection ?What is this medication? ?TRASTUZUMAB; HYALURONIDASE (tras TOO zoo mab / hye al ur ON i dase) is used to treat breast cancer and stomach cancer. Trastuzumab is a monoclonal antibody. Hyaluronidase is used to improve the effects of trastuzumab. ?This medicine may be used for other purposes; ask your health care provider or pharmacist if you have questions. ?COMMON BRAND NAME(S): HERCEPTIN HYLECTA ?What should I tell my care team before I take this medication? ?They need to know if you have any of these conditions: ?heart disease ?heart failure ?lung or breathing disease, like asthma ?an unusual or allergic reaction to trastuzumab, or other medications, foods, dyes, or preservatives ?pregnant or trying to get pregnant ?breast-feeding ?How should I use this medication? ?This medicine is for injection under the skin. It is given by a health care professional in a hospital or clinic setting. ?Talk to your pediatrician regarding the use of this medicine in children. This medicine is not approved for use in children. ?Overdosage: If you think you have taken too much of this medicine contact a poison control center or emergency room at once. ?NOTE: This medicine is only for you. Do not share this medicine with others. ?What if I miss a dose? ?It is important not to miss a dose. Call your doctor or health care professional if you are unable to keep an appointment. ?What may interact with this medication? ?This medicine may interact with the following medications: ?certain types of chemotherapy, such as daunorubicin, doxorubicin, epirubicin, and idarubicin ?This list may not describe all possible interactions. Give your health care provider a list of all the medicines, herbs, non-prescription drugs, or dietary supplements you use. Also tell them if you smoke, drink alcohol, or use illegal drugs. Some items may interact with your medicine. ?What should I watch for while using this  medication? ?Visit your doctor for checks on your progress. Report any side effects. Continue your course of treatment even though you feel ill unless your doctor tells you to stop. ?Call your doctor or health care professional for advice if you get a fever, chills or sore throat, or other symptoms of a cold or flu. Do not treat yourself. Try to avoid being around people who are sick. ?You may experience fever, chills and shaking during your first infusion. These effects are usually mild and can be treated with other medicines. Report any side effects during the infusion to your health care professional. Fever and chills usually do not happen with later infusions. ?Do not become pregnant while taking this medicine or for 7 months after stopping it. Women should inform their doctor if they wish to become pregnant or think they might be pregnant. Women of child-bearing potential will need to have a negative pregnancy test before starting this medicine. There is a potential for serious side effects to an unborn child. Talk to your health care professional or pharmacist for more information. Do not breast-feed an infant while taking this medicine or for 7 months after stopping it. ?What side effects may I notice from receiving this medication? ?Side effects that you should report to your doctor or health care professional as soon as possible: ?allergic reactions like skin rash, itching or hives, swelling of the face, lips, or tongue ?breathing problems ?chest pain or palpitations ?cough ?fever ?general ill feeling or flu-like symptoms ?signs of worsening heart failure like breathing problems; swelling in your legs and feet ?Side effects that usually do not require medical attention (report these to your doctor  or health care professional if they continue or are bothersome): ?bone pain ?changes in taste ?diarrhea ?joint pain ?nausea/vomiting ?unusually weak or tired ?weight loss ?This list may not describe all possible  side effects. Call your doctor for medical advice about side effects. You may report side effects to FDA at 1-800-FDA-1088. ?Where should I keep my medication? ?This drug is given in a hospital or clinic and will not be stored at home. ?NOTE: This sheet is a summary. It may not cover all possible information. If you have questions about this medicine, talk to your doctor, pharmacist, or health care provider. ?? 2022 Elsevier/Gold Standard (2017-12-16 00:00:00) ? ?

## 2021-10-30 ENCOUNTER — Other Ambulatory Visit: Payer: Self-pay

## 2021-10-30 ENCOUNTER — Telehealth: Payer: Self-pay

## 2021-10-30 NOTE — Telephone Encounter (Signed)
Pt called stating her appointments in Epic are incorrect.  Pt stated she is scheduled to have a Port flushed w/lab with every Herceptin Hylecta appt which is incorrect.  Pt stated she is also not supposed to see Dr. Burr Medico until June 2023 after she has completed her CT Scan.  Pt is scheduled for an ECHO 11/22/2021 as well.  Pt requested to cancel her port flush w/lab appts on 11/09/2021 & 12/21/2021.  Pt also requested to have appt with Dr. Burr Medico in May 2023 canceled as well.  Cancelled appts in Epic.  Pt review new appt scheduled and stated it's now correct. ?

## 2021-11-09 ENCOUNTER — Inpatient Hospital Stay: Payer: BC Managed Care – PPO

## 2021-11-09 ENCOUNTER — Other Ambulatory Visit: Payer: Self-pay

## 2021-11-09 VITALS — BP 150/85 | HR 75 | Temp 98.8°F | Resp 18

## 2021-11-09 DIAGNOSIS — C50911 Malignant neoplasm of unspecified site of right female breast: Secondary | ICD-10-CM

## 2021-11-09 DIAGNOSIS — Z5112 Encounter for antineoplastic immunotherapy: Secondary | ICD-10-CM | POA: Diagnosis not present

## 2021-11-09 MED ORDER — TRASTUZUMAB-HYALURONIDASE-OYSK 600-10000 MG-UNT/5ML ~~LOC~~ SOLN
600.0000 mg | Freq: Once | SUBCUTANEOUS | Status: AC
  Administered 2021-11-09: 600 mg via SUBCUTANEOUS
  Filled 2021-11-09: qty 5

## 2021-11-09 NOTE — Patient Instructions (Signed)
[facility]  Discharge Instructions: ?Thank you for choosing [facility] to provide your oncology and hematology care.  ? ?If you have a lab appointment with the [facility], please go directly to the [facility] and check in at the registration area. ?  ?Wear comfortable clothing and clothing appropriate for easy access to any Portacath or PICC line.  ? ?We strive to give you quality time with your provider. You may need to reschedule your appointment if you arrive late (15 or more minutes).  Arriving late affects you and other patients whose appointments are after yours.  Also, if you miss three or more appointments without notifying the office, you may be dismissed from the clinic at the provider?s discretion.    ?  ?For prescription refill requests, have your pharmacy contact our office and allow 72 hours for refills to be completed.   ? ?Today you received the following chemotherapy and/or immunotherapy agents: Herceptin Hylecta.    ?  ?To help prevent nausea and vomiting after your treatment, we encourage you to take your nausea medication as directed. ? ?BELOW ARE SYMPTOMS THAT SHOULD BE REPORTED IMMEDIATELY: ?*FEVER GREATER THAN 100.4 F (38 ?C) OR HIGHER ?*CHILLS OR SWEATING ?*NAUSEA AND VOMITING THAT IS NOT CONTROLLED WITH YOUR NAUSEA MEDICATION ?*UNUSUAL SHORTNESS OF BREATH ?*UNUSUAL BRUISING OR BLEEDING ?*URINARY PROBLEMS (pain or burning when urinating, or frequent urination) ?*BOWEL PROBLEMS (unusual diarrhea, constipation, pain near the anus) ?TENDERNESS IN MOUTH AND THROAT WITH OR WITHOUT PRESENCE OF ULCERS (sore throat, sores in mouth, or a toothache) ?UNUSUAL RASH, SWELLING OR PAIN  ?UNUSUAL VAGINAL DISCHARGE OR ITCHING  ? ?Items with * indicate a potential emergency and should be followed up as soon as possible or go to the Emergency Department if any problems should occur. ? ?Please show the CHEMOTHERAPY ALERT CARD or IMMUNOTHERAPY ALERT CARD at  check-in to the Emergency Department and triage nurse. ? ?Should you have questions after your visit or need to cancel or reschedule your appointment, please contact [facility]  Dept: [phone]  and follow the prompts.  Office hours are 8:00 a.m. to 4:30 p.m. Monday - Friday. Please note that voicemails left after 4:00 p.m. may not be returned until the following business day.  We are closed weekends and major holidays. You have access to a nurse at all times for urgent questions. Please call the main number to the clinic Dept: [phone] and follow the prompts. ? ? ?For any non-urgent questions, you may also contact your provider using MyChart. We now offer e-Visits for anyone 51 and older to request care online for non-urgent symptoms. For details visit [email or web address]. ?  ?Also download the MyChart app! Go to the app store, search "MyChart", open the app, select [facility], and log in with your MyChart username and password. ? ?Due to Covid, a mask is required upon entering the hospital/clinic. If you do not have a mask, one will be given to you upon arrival. For doctor visits, patients may have 1 support person aged 29 or older with them. For treatment visits, patients cannot have anyone with them due to current Covid guidelines and our immunocompromised population.  ? ?

## 2021-11-09 NOTE — Progress Notes (Signed)
Per Burr Medico MD, ok to treat today with Echo results from 05/19/2021. LEF 60-65% at that time.  ?

## 2021-11-22 ENCOUNTER — Encounter (HOSPITAL_COMMUNITY): Payer: Self-pay | Admitting: Internal Medicine

## 2021-11-22 ENCOUNTER — Ambulatory Visit (HOSPITAL_COMMUNITY)
Admission: RE | Admit: 2021-11-22 | Discharge: 2021-11-22 | Disposition: A | Discharge status: Home / Self Care | Payer: BC Managed Care – PPO | Source: Ambulatory Visit | Attending: Internal Medicine | Admitting: Internal Medicine

## 2021-11-22 DIAGNOSIS — C50919 Malignant neoplasm of unspecified site of unspecified female breast: Secondary | ICD-10-CM | POA: Insufficient documentation

## 2021-11-22 DIAGNOSIS — Z01818 Encounter for other preprocedural examination: Secondary | ICD-10-CM | POA: Diagnosis present

## 2021-11-22 DIAGNOSIS — Z0189 Encounter for other specified special examinations: Secondary | ICD-10-CM

## 2021-11-22 DIAGNOSIS — C7800 Secondary malignant neoplasm of unspecified lung: Secondary | ICD-10-CM | POA: Insufficient documentation

## 2021-11-22 LAB — ECHOCARDIOGRAM COMPLETE
Area-P 1/2: 2.34 cm2
S' Lateral: 2.8 cm

## 2021-11-30 ENCOUNTER — Inpatient Hospital Stay: Payer: BC Managed Care – PPO

## 2021-11-30 ENCOUNTER — Inpatient Hospital Stay: Payer: BC Managed Care – PPO | Attending: Hematology

## 2021-11-30 DIAGNOSIS — C50311 Malignant neoplasm of lower-inner quadrant of right female breast: Secondary | ICD-10-CM | POA: Insufficient documentation

## 2021-11-30 DIAGNOSIS — Z5112 Encounter for antineoplastic immunotherapy: Secondary | ICD-10-CM | POA: Insufficient documentation

## 2021-12-01 ENCOUNTER — Telehealth: Payer: Self-pay | Admitting: Hematology

## 2021-12-01 NOTE — Telephone Encounter (Signed)
Rescheduled missed appointment per 4/20 secure chat. Patient is aware. ?

## 2021-12-04 ENCOUNTER — Inpatient Hospital Stay: Payer: BC Managed Care – PPO

## 2021-12-04 VITALS — BP 156/82 | HR 86 | Temp 98.3°F | Resp 18 | Wt 231.5 lb

## 2021-12-04 DIAGNOSIS — C50311 Malignant neoplasm of lower-inner quadrant of right female breast: Secondary | ICD-10-CM | POA: Diagnosis present

## 2021-12-04 DIAGNOSIS — Z95828 Presence of other vascular implants and grafts: Secondary | ICD-10-CM

## 2021-12-04 DIAGNOSIS — C50911 Malignant neoplasm of unspecified site of right female breast: Secondary | ICD-10-CM

## 2021-12-04 DIAGNOSIS — Z5112 Encounter for antineoplastic immunotherapy: Secondary | ICD-10-CM | POA: Diagnosis present

## 2021-12-04 LAB — CMP (CANCER CENTER ONLY)
ALT: 14 U/L (ref 0–44)
AST: 10 U/L — ABNORMAL LOW (ref 15–41)
Albumin: 3.9 g/dL (ref 3.5–5.0)
Alkaline Phosphatase: 65 U/L (ref 38–126)
Anion gap: 9 (ref 5–15)
BUN: 11 mg/dL (ref 6–20)
CO2: 30 mmol/L (ref 22–32)
Calcium: 9 mg/dL (ref 8.9–10.3)
Chloride: 102 mmol/L (ref 98–111)
Creatinine: 0.86 mg/dL (ref 0.44–1.00)
GFR, Estimated: >60 mL/min (ref >60)
Glucose, Bld: 111 mg/dL — ABNORMAL HIGH (ref 70–99)
Potassium: 3.7 mmol/L (ref 3.5–5.1)
Sodium: 141 mmol/L (ref 135–145)
Total Bilirubin: 0.4 mg/dL (ref 0.3–1.2)
Total Protein: 7.9 g/dL (ref 6.5–8.1)

## 2021-12-04 LAB — CBC WITH DIFFERENTIAL (CANCER CENTER ONLY)
Abs Immature Granulocytes: 0.01 10*3/uL (ref 0.00–0.07)
Basophils Absolute: 0 10*3/uL (ref 0.0–0.1)
Basophils Relative: 0 %
Eosinophils Absolute: 0.1 10*3/uL (ref 0.0–0.5)
Eosinophils Relative: 2 %
HCT: 36.2 % (ref 36.0–46.0)
Hemoglobin: 11.9 g/dL — ABNORMAL LOW (ref 12.0–15.0)
Immature Granulocytes: 0 %
Lymphocytes Relative: 39 %
Lymphs Abs: 2.8 10*3/uL (ref 0.7–4.0)
MCH: 29.4 pg (ref 26.0–34.0)
MCHC: 32.9 g/dL (ref 30.0–36.0)
MCV: 89.4 fL (ref 80.0–100.0)
Monocytes Absolute: 0.5 10*3/uL (ref 0.1–1.0)
Monocytes Relative: 7 %
Neutro Abs: 3.8 10*3/uL (ref 1.7–7.7)
Neutrophils Relative %: 52 %
Platelet Count: 256 10*3/uL (ref 150–400)
RBC: 4.05 MIL/uL (ref 3.87–5.11)
RDW: 13 % (ref 11.5–15.5)
WBC Count: 7.3 10*3/uL (ref 4.0–10.5)
nRBC: 0 % (ref 0.0–0.2)

## 2021-12-04 MED ORDER — SODIUM CHLORIDE 0.9% FLUSH
10.0000 mL | Freq: Once | INTRAVENOUS | Status: AC
  Administered 2021-12-04: 10 mL

## 2021-12-04 MED ORDER — TRASTUZUMAB-HYALURONIDASE-OYSK 600-10000 MG-UNT/5ML ~~LOC~~ SOLN
600.0000 mg | Freq: Once | SUBCUTANEOUS | Status: AC
  Administered 2021-12-04: 600 mg via SUBCUTANEOUS
  Filled 2021-12-04: qty 5

## 2021-12-04 NOTE — Patient Instructions (Signed)
[facility]  Discharge Instructions: ?Thank you for choosing [facility] to provide your oncology and hematology care.  ? ?If you have a lab appointment with the [facility], please go directly to the [facility] and check in at the registration area. ?  ?Wear comfortable clothing and clothing appropriate for easy access to any Portacath or PICC line.  ? ?We strive to give you quality time with your provider. You may need to reschedule your appointment if you arrive late (15 or more minutes).  Arriving late affects you and other patients whose appointments are after yours.  Also, if you miss three or more appointments without notifying the office, you may be dismissed from the clinic at the provider?s discretion.    ?  ?For prescription refill requests, have your pharmacy contact our office and allow 72 hours for refills to be completed.   ? ?Today you received the following chemotherapy and/or immunotherapy agents: Herceptin Hylecta.    ?  ?To help prevent nausea and vomiting after your treatment, we encourage you to take your nausea medication as directed. ? ?BELOW ARE SYMPTOMS THAT SHOULD BE REPORTED IMMEDIATELY: ?*FEVER GREATER THAN 100.4 F (38 ?C) OR HIGHER ?*CHILLS OR SWEATING ?*NAUSEA AND VOMITING THAT IS NOT CONTROLLED WITH YOUR NAUSEA MEDICATION ?*UNUSUAL SHORTNESS OF BREATH ?*UNUSUAL BRUISING OR BLEEDING ?*URINARY PROBLEMS (pain or burning when urinating, or frequent urination) ?*BOWEL PROBLEMS (unusual diarrhea, constipation, pain near the anus) ?TENDERNESS IN MOUTH AND THROAT WITH OR WITHOUT PRESENCE OF ULCERS (sore throat, sores in mouth, or a toothache) ?UNUSUAL RASH, SWELLING OR PAIN  ?UNUSUAL VAGINAL DISCHARGE OR ITCHING  ? ?Items with * indicate a potential emergency and should be followed up as soon as possible or go to the Emergency Department if any problems should occur. ? ?Please show the CHEMOTHERAPY ALERT CARD or IMMUNOTHERAPY ALERT CARD at  check-in to the Emergency Department and triage nurse. ? ?Should you have questions after your visit or need to cancel or reschedule your appointment, please contact [facility]  Dept: [phone]  and follow the prompts.  Office hours are 8:00 a.m. to 4:30 p.m. Monday - Friday. Please note that voicemails left after 4:00 p.m. may not be returned until the following business day.  We are closed weekends and major holidays. You have access to a nurse at all times for urgent questions. Please call the main number to the clinic Dept: [phone] and follow the prompts. ? ? ?For any non-urgent questions, you may also contact your provider using MyChart. We now offer e-Visits for anyone 50 and older to request care online for non-urgent symptoms. For details visit [email or web address]. ?  ?Also download the MyChart app! Go to the app store, search "MyChart", open the app, select [facility], and log in with your MyChart username and password. ? ?Due to Covid, a mask is required upon entering the hospital/clinic. If you do not have a mask, one will be given to you upon arrival. For doctor visits, patients may have 1 support Kaelynne Christley aged 4 or older with them. For treatment visits, patients cannot have anyone with them due to current Covid guidelines and our immunocompromised population.  ? ?

## 2021-12-21 ENCOUNTER — Ambulatory Visit: Payer: BC Managed Care – PPO

## 2021-12-21 ENCOUNTER — Ambulatory Visit: Payer: BC Managed Care – PPO | Admitting: Hematology

## 2021-12-21 ENCOUNTER — Other Ambulatory Visit: Payer: BC Managed Care – PPO

## 2022-01-04 ENCOUNTER — Inpatient Hospital Stay: Payer: BC Managed Care – PPO | Attending: Hematology

## 2022-01-04 ENCOUNTER — Other Ambulatory Visit: Payer: Self-pay

## 2022-01-04 VITALS — BP 147/77 | HR 86 | Temp 98.5°F | Resp 17 | Wt 224.5 lb

## 2022-01-04 DIAGNOSIS — C50311 Malignant neoplasm of lower-inner quadrant of right female breast: Secondary | ICD-10-CM | POA: Diagnosis present

## 2022-01-04 DIAGNOSIS — C50911 Malignant neoplasm of unspecified site of right female breast: Secondary | ICD-10-CM

## 2022-01-04 DIAGNOSIS — Z5112 Encounter for antineoplastic immunotherapy: Secondary | ICD-10-CM | POA: Insufficient documentation

## 2022-01-04 MED ORDER — TRASTUZUMAB-HYALURONIDASE-OYSK 600-10000 MG-UNT/5ML ~~LOC~~ SOLN
600.0000 mg | Freq: Once | SUBCUTANEOUS | Status: AC
  Administered 2022-01-04: 600 mg via SUBCUTANEOUS
  Filled 2022-01-04: qty 5

## 2022-01-04 MED ORDER — SODIUM CHLORIDE 0.9 % IV SOLN: Freq: Once | INTRAVENOUS | Status: DC

## 2022-01-04 NOTE — Patient Instructions (Signed)
[facility]  Discharge Instructions: Thank you for choosing [facility] to provide your oncology and hematology care.   If you have a lab appointment with the [facility], please go directly to the [facility] and check in at the registration area.   Wear comfortable clothing and clothing appropriate for easy access to any Portacath or PICC line.   We strive to give you quality time with your provider. You may need to reschedule your appointment if you arrive late (15 or more minutes).  Arriving late affects you and other patients whose appointments are after yours.  Also, if you miss three or more appointments without notifying the office, you may be dismissed from the clinic at the provider's discretion.      For prescription refill requests, have your pharmacy contact our office and allow 72 hours for refills to be completed.    Today you received the following chemotherapy and/or immunotherapy agents: Herceptin Hylecta.      To help prevent nausea and vomiting after your treatment, we encourage you to take your nausea medication as directed.  BELOW ARE SYMPTOMS THAT SHOULD BE REPORTED IMMEDIATELY: *FEVER GREATER THAN 100.4 F (38 C) OR HIGHER *CHILLS OR SWEATING *NAUSEA AND VOMITING THAT IS NOT CONTROLLED WITH YOUR NAUSEA MEDICATION *UNUSUAL SHORTNESS OF BREATH *UNUSUAL BRUISING OR BLEEDING *URINARY PROBLEMS (pain or burning when urinating, or frequent urination) *BOWEL PROBLEMS (unusual diarrhea, constipation, pain near the anus) TENDERNESS IN MOUTH AND THROAT WITH OR WITHOUT PRESENCE OF ULCERS (sore throat, sores in mouth, or a toothache) UNUSUAL RASH, SWELLING OR PAIN  UNUSUAL VAGINAL DISCHARGE OR ITCHING   Items with * indicate a potential emergency and should be followed up as soon as possible or go to the Emergency Department if any problems should occur.  Please show the CHEMOTHERAPY ALERT CARD or IMMUNOTHERAPY ALERT CARD at  check-in to the Emergency Department and triage nurse.  Should you have questions after your visit or need to cancel or reschedule your appointment, please contact [facility]  Dept: [phone]  and follow the prompts.  Office hours are 8:00 a.m. to 4:30 p.m. Monday - Friday. Please note that voicemails left after 4:00 p.m. may not be returned until the following business day.  We are closed weekends and major holidays. You have access to a nurse at all times for urgent questions. Please call the main number to the clinic Dept: [phone] and follow the prompts.   For any non-urgent questions, you may also contact your provider using MyChart. We now offer e-Visits for anyone 39 and older to request care online for non-urgent symptoms. For details visit [email or web address].   Also download the MyChart app! Go to the app store, search "MyChart", open the app, select [facility], and log in with your MyChart username and password.  Due to Covid, a mask is required upon entering the hospital/clinic. If you do not have a mask, one will be given to you upon arrival. For doctor visits, patients may have 1 support person aged 30 or older with them. For treatment visits, patients cannot have anyone with them due to current Covid guidelines and our immunocompromised population.

## 2022-01-11 ENCOUNTER — Other Ambulatory Visit: Payer: BC Managed Care – PPO

## 2022-01-11 ENCOUNTER — Ambulatory Visit: Payer: BC Managed Care – PPO

## 2022-01-11 ENCOUNTER — Ambulatory Visit: Payer: BC Managed Care – PPO | Admitting: Hematology

## 2022-01-15 ENCOUNTER — Ambulatory Visit (HOSPITAL_COMMUNITY)
Admission: RE | Admit: 2022-01-15 | Discharge: 2022-01-15 | Disposition: A | Discharge status: Home / Self Care | Payer: BC Managed Care – PPO | Source: Ambulatory Visit | Attending: Physician Assistant | Admitting: Physician Assistant

## 2022-01-15 DIAGNOSIS — C50511 Malignant neoplasm of lower-outer quadrant of right female breast: Secondary | ICD-10-CM | POA: Insufficient documentation

## 2022-01-15 DIAGNOSIS — C7800 Secondary malignant neoplasm of unspecified lung: Secondary | ICD-10-CM | POA: Insufficient documentation

## 2022-01-15 DIAGNOSIS — C50911 Malignant neoplasm of unspecified site of right female breast: Secondary | ICD-10-CM

## 2022-01-15 DIAGNOSIS — Z171 Estrogen receptor negative status [ER-]: Secondary | ICD-10-CM | POA: Diagnosis present

## 2022-01-15 MED ORDER — HEPARIN SOD (PORK) LOCK FLUSH 100 UNIT/ML IV SOLN
500.0000 [IU] | Freq: Once | INTRAVENOUS | Status: AC
  Administered 2022-01-15: 500 [IU] via INTRAVENOUS

## 2022-01-15 MED ORDER — SODIUM CHLORIDE (PF) 0.9 % IJ SOLN
INTRAMUSCULAR | Status: AC
  Filled 2022-01-15: qty 50

## 2022-01-15 MED ORDER — HEPARIN SOD (PORK) LOCK FLUSH 100 UNIT/ML IV SOLN
INTRAVENOUS | Status: AC
  Filled 2022-01-15: qty 5

## 2022-01-15 MED ORDER — IOHEXOL 300 MG/ML  SOLN
100.0000 mL | Freq: Once | INTRAMUSCULAR | Status: AC | PRN
  Administered 2022-01-15: 100 mL via INTRAVENOUS

## 2022-01-18 ENCOUNTER — Telehealth: Payer: Self-pay

## 2022-01-18 NOTE — Telephone Encounter (Signed)
I spoke with pt and advised as indicated. Pt expressed understanding of this information. 

## 2022-01-18 NOTE — Telephone Encounter (Signed)
-----   Message from [place], PA-C sent at 01/18/2022  8:27 AM EDT ----- Can you call her and let her know her scan looks stable. She has an appointment with Dr. Burr Medico next week Thursday who can go over in more depth but just let her know it looks stable.  ----- Message ----- From: Interface, Rad Results In Sent: 01/16/2022   1:37 PM EDT To: Tobe Sos Heilingoetter, PA-C

## 2022-01-25 ENCOUNTER — Inpatient Hospital Stay: Payer: BC Managed Care – PPO

## 2022-01-25 ENCOUNTER — Other Ambulatory Visit: Payer: Self-pay

## 2022-01-25 ENCOUNTER — Inpatient Hospital Stay (HOSPITAL_BASED_OUTPATIENT_CLINIC_OR_DEPARTMENT_OTHER): Payer: BC Managed Care – PPO | Admitting: Hematology

## 2022-01-25 ENCOUNTER — Encounter: Payer: Self-pay | Admitting: Hematology

## 2022-01-25 ENCOUNTER — Inpatient Hospital Stay: Payer: BC Managed Care – PPO | Attending: Hematology

## 2022-01-25 VITALS — BP 142/76 | HR 85 | Temp 98.7°F | Resp 18 | Ht 70.0 in | Wt 228.6 lb

## 2022-01-25 DIAGNOSIS — Z8781 Personal history of (healed) traumatic fracture: Secondary | ICD-10-CM | POA: Diagnosis not present

## 2022-01-25 DIAGNOSIS — Z171 Estrogen receptor negative status [ER-]: Secondary | ICD-10-CM | POA: Diagnosis not present

## 2022-01-25 DIAGNOSIS — C7802 Secondary malignant neoplasm of left lung: Secondary | ICD-10-CM | POA: Diagnosis not present

## 2022-01-25 DIAGNOSIS — C7801 Secondary malignant neoplasm of right lung: Secondary | ICD-10-CM | POA: Insufficient documentation

## 2022-01-25 DIAGNOSIS — Z95828 Presence of other vascular implants and grafts: Secondary | ICD-10-CM

## 2022-01-25 DIAGNOSIS — G629 Polyneuropathy, unspecified: Secondary | ICD-10-CM | POA: Insufficient documentation

## 2022-01-25 DIAGNOSIS — C50911 Malignant neoplasm of unspecified site of right female breast: Secondary | ICD-10-CM

## 2022-01-25 DIAGNOSIS — C7800 Secondary malignant neoplasm of unspecified lung: Secondary | ICD-10-CM

## 2022-01-25 DIAGNOSIS — Z5112 Encounter for antineoplastic immunotherapy: Secondary | ICD-10-CM | POA: Insufficient documentation

## 2022-01-25 DIAGNOSIS — C50311 Malignant neoplasm of lower-inner quadrant of right female breast: Secondary | ICD-10-CM | POA: Diagnosis present

## 2022-01-25 LAB — CMP (CANCER CENTER ONLY)
ALT: 26 U/L (ref 0–44)
AST: 12 U/L — ABNORMAL LOW (ref 15–41)
Albumin: 4 g/dL (ref 3.5–5.0)
Alkaline Phosphatase: 68 U/L (ref 38–126)
Anion gap: 7 (ref 5–15)
BUN: 12 mg/dL (ref 6–20)
CO2: 29 mmol/L (ref 22–32)
Calcium: 9.3 mg/dL (ref 8.9–10.3)
Chloride: 104 mmol/L (ref 98–111)
Creatinine: 0.96 mg/dL (ref 0.44–1.00)
GFR, Estimated: >60 mL/min (ref >60)
Glucose, Bld: 142 mg/dL — ABNORMAL HIGH (ref 70–99)
Potassium: 3.3 mmol/L — ABNORMAL LOW (ref 3.5–5.1)
Sodium: 140 mmol/L (ref 135–145)
Total Bilirubin: 0.5 mg/dL (ref 0.3–1.2)
Total Protein: 7.8 g/dL (ref 6.5–8.1)

## 2022-01-25 LAB — CBC WITH DIFFERENTIAL (CANCER CENTER ONLY)
Abs Immature Granulocytes: 0.02 10*3/uL (ref 0.00–0.07)
Basophils Absolute: 0 10*3/uL (ref 0.0–0.1)
Basophils Relative: 0 %
Eosinophils Absolute: 0.1 10*3/uL (ref 0.0–0.5)
Eosinophils Relative: 1 %
HCT: 35.2 % — ABNORMAL LOW (ref 36.0–46.0)
Hemoglobin: 11.7 g/dL — ABNORMAL LOW (ref 12.0–15.0)
Immature Granulocytes: 0 %
Lymphocytes Relative: 30 %
Lymphs Abs: 2 10*3/uL (ref 0.7–4.0)
MCH: 29.7 pg (ref 26.0–34.0)
MCHC: 33.2 g/dL (ref 30.0–36.0)
MCV: 89.3 fL (ref 80.0–100.0)
Monocytes Absolute: 0.5 10*3/uL (ref 0.1–1.0)
Monocytes Relative: 7 %
Neutro Abs: 4.1 10*3/uL (ref 1.7–7.7)
Neutrophils Relative %: 62 %
Platelet Count: 222 10*3/uL (ref 150–400)
RBC: 3.94 MIL/uL (ref 3.87–5.11)
RDW: 13 % (ref 11.5–15.5)
WBC Count: 6.7 10*3/uL (ref 4.0–10.5)
nRBC: 0 % (ref 0.0–0.2)

## 2022-01-25 MED ORDER — SODIUM CHLORIDE 0.9% FLUSH
10.0000 mL | Freq: Once | INTRAVENOUS | Status: AC
  Administered 2022-01-25: 10 mL

## 2022-01-25 MED ORDER — TRASTUZUMAB-HYALURONIDASE-OYSK 600-10000 MG-UNT/5ML ~~LOC~~ SOLN
600.0000 mg | Freq: Once | SUBCUTANEOUS | Status: AC
  Administered 2022-01-25: 600 mg via SUBCUTANEOUS
  Filled 2022-01-25: qty 5

## 2022-01-25 NOTE — Progress Notes (Signed)
[place]   Telephone:(336) [phone] Fax:(336) [phone]   Clinic Follow up Note   Patient Care Team: Virginia Rochester, [place] as PCP - General (Family Medicine) [name], DO (Family Medicine) Stark Klein, MD as Consulting Physician (General Surgery) [name], MD as Consulting Physician (Hematology) Clydie Braun, MD (Obstetrics and Gynecology) Haroldine Laws, Shaune Pascal, MD as Consulting Physician (Cardiology)  Date of Service:  01/25/2022  CHIEF COMPLAINT: f/u of metastatic breast cancer  CURRENT THERAPY:  Maintenance Herceptin, q3weeks, since 04/12/15  -currently Hylecta injection since 05/2019  ASSESSMENT & PLAN:  [name] [name] is a 51 y.o. peri-menopausal female with   1. 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 -most recent mammogram 07/2021 was negative. -most recent echo 11/22/21 was stable. -restaging CT CAP 01/15/22 showed NED. I reviewed the images and discussed the results with her today. -She has been on maintenance Herceptin for about 7 years, was negative staging scans.  She is probably cured, although I am not able to approve it. Will continue Herceptin for now, I would consider stopping therapy after 10 years -she continues to tolerate herceptin injections well. Labs reviewed, overall stable. -F/u in 12 weeks.   2. Bone Health -she has never had DEXA scan before. I ordered to be done soon.   3. HTN -05/2021 echo was normal, continue q6 months -f/up Dr. 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 Irregular bleeding, h/o uterine polyps removed 2020 -She has had irregular periods  since her chemo for breast cancer ended in 2016.  -s/p D&C 11/2018 with benign polyp  -she continues having irregular vaginal bleeding, most recent endometrial biopsy 06/2021 was benign -continue f[facility]    6. Genetic testing -Was retested by genetic counseling for hereditary disposition for cancer in December 2022.  She was negative.   7. Left rib fracture -Seen on recent CT scan, she had sinus infection and pneumonia in April, probably related to her cough at that time.  Her pain has improved, CT scan showed healing fractures.  No concern for bone metastasis -I recommend her to get a bone density, to see if she has osteoporosis.    Plan: -proceed with herceptin injection today and every 3 weeks [facility]  -flush every 6 weeks -lab and in 12 weeks -f/u in 4 months    No problem-specific Assessment & Plan notes found for this encounter.   SUMMARY OF ONCOLOGIC HISTORY: Oncology History Overview Note  Breast cancer 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   Staging form: Breast, AJCC 7th Edition     Clinical stage from 07/22/2014: Stage IV (T3, N1, M1) - Unsigned     Breast cancer 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 ([facility]  07/02/2014 Mammogram   Mammogram showed a 2cm right beast mass and a 1.8cm right axillary node. MRI breast on 07/16/2014 showed 7cm R breast lesion and 4.4cm r axillary node    07/02/2014 Imaging   CT CAP: a 4.7cm mass in LUL lung and a 2.1cm mas in RML, and a small nodule in RUL, suspecious for metastasis     07/09/2014 Initial Diagnosis   right IDA with b/l lung lesions, ER-/PR-/HER2+   07/09/2014  Initial Biopsy   US guided right breast mass and axillary node biopsy showed IDA, and DCIS, ER-/PR-/HER2+   07/26/2014 Pathologic Stage   Left lung mass by IR, path revealed high grade carcinoma, morphology similar to breast tumor biopsy, TTF(-), NapsinA(-), ER(-)   08/04/2014 - 03/22/2015 Chemotherapy   weekly Paclitaxel 52m/m2,  trastuzumab and pertuzumab every 3 weeks   08/30/2014 Genetic Testing   BreastNext panel was negative. 17 genes including BRCA1, BRCA2, were negative for mutations.    10/04/2014 Imaging   Interval decrease in the right axillary lymphadenopathy. Bilateral pulmonary lesions with left hilar lymphadenopathy also markedly decreased in the interval. The left hilar lymphadenopathy has resolved.   12/20/2014 Imaging   restaging CT showed stable disease, no new lesions    03/29/2015 Pathology Results    right breast lumpectomy showed  chemotherapy treatment effect,  a 1 mm residual tumor,   margins were widely negative, 5 sentinel lymph nodes and 2 axillary lymph nodes were negative.    03/29/2015 Surgery    right breast lumpectomy and sentinel lymph node biopsy  by Dr. BBarry Dienes  04/12/2015 -  Chemotherapy   Herceptin maintenance therapy , 6 mg/kg, every 3 weeks since 04/12/15. Switched to Herceptin Hylecta injection q3weeks after 06/10/19.   05/03/2015 - 06/14/2015 Radiation Therapy   right breast adjuvant irradiation by Dr. MValere Dross   08/28/2016 Imaging   CT CAP w Contrast  IMPRESSION: 1. No acute process or evidence of metastatic disease within the chest, abdomen, or pelvis. 2. Similar to less well-defined left upper lobe density, likely scarring. No evidence of new or progressive pulmonary metastasis. 3. Right nephrolithiasis.   01/22/2017 Imaging   CT Chest W Contrast 01/22/17 IMPRESSION: 1. Stable exam. No new or progressive findings. No evidence for metastatic disease. 2. Left upper lobe architectural distortion/scarring is stable. 3. Nonobstructing right renal stone.   07/31/2017 Imaging   Bone Scan Whole Body 07/31/17  IMPRESSION: 1. No scintigraphic evidence of osseous metastatic disease. 2. Thoracolumbar scoliosis.    07/31/2017 Imaging   CT CAP W Contrast 07/31/17 IMPRESSION: 1. No findings of active or recurrent malignancy. 2. Other imaging findings of potential clinical  significance: Aortic Atherosclerosis (ICD10-I70.0). Mild cardiomegaly. Postoperative and radiation therapy findings in the right chest. Nonobstructive right nephrolithiasis. Thoracolumbar scoliosis.     01/20/2018 Echocardiogram   ECHO 01/20/2018 Study Conclusions   - Left ventricle: The cavity size was normal. Wall thickness was   normal. Systolic function was normal. The estimated ejection   fraction was in the range of 55% to 60%. Wall motion was normal;   there were no regional wall motion abnormalities. Doppler   parameters are consistent with abnormal left ventricular   relaxation (grade 1 diastolic dysfunction). - Impressions: GLS -20.7% LS&' 10.4 cm.    02/17/2018 Imaging   02/17/2018 CT CAP IMPRESSION: 1. No evidence for residual or recurrent tumor or metastatic disease. 2.  Aortic Atherosclerosis (ICD10-I70.0). 3. Right renal calculi. 4. Scoliosis.   08/11/2018 Imaging   CT CAP W Contrast 08/11/18  IMPRESSION: No evidence for localized recurrence or metastatic disease in the chest, abdomen or pelvis.   02/23/2019 Imaging   CT CAP W Contrast  IMPRESSION: 1. No findings of active malignancy in the chest, abdomen, or pelvis. 2. 3 mm right kidney lower pole nonobstructive renal calculus. 3. Thoracolumbar scoliosis.   12/08/2019 Imaging   CT CAP W contrast  IMPRESSION: 1. Unchanged post treatment appearance of the lungs. No evidence of recurrent or new metastatic  disease in the chest, abdomen, or pelvis.   2. Postoperative findings of right lumpectomy and axillary lymph node dissection.   3.  Nonobstructive right nephrolithiasis.   11/17/2020 Imaging   CT CAP IMPRESSION: 1. No evidence of recurrence or new metastatic disease within the chest, abdomen, or pelvis. 2. Similar post treatment changes in the lungs and postoperative findings of right lumpectomy and axillary lymph node dissection. 3. Colonic diverticulosis without findings of acute diverticulitis.    08/10/2021 Genetic Testing   Negative genetic testing on the CancerNext-Expanded+RNAinsight panel.  The report date is August 10, 2021.  The CancerNext-Expanded gene panel offered by [facility] and includes sequencing and rearrangement analysis for the following 77 genes: AIP, ALK, APC*, ATM*, AXIN2, BAP1, BARD1, BLM, BMPR1A, BRCA1*, BRCA2*, BRIP1*, CDC73, CDH1*, CDK4, CDKN1B, CDKN2A, CHEK2*, CTNNA1, DICER1, FANCC, FH, FLCN, GALNT12, KIF1B, LZTR1, MAX, MEN1, MET, MLH1*, MSH2*, MSH3, MSH6*, MUTYH*, NBN, NF1*, NF2, NTHL1, PALB2*, PHOX2B, PMS2*, POT1, PRKAR1A, PTCH1, PTEN*, RAD51C*, RAD51D*, RB1, RECQL, RET, SDHA, SDHAF2, SDHB, SDHC, SDHD, SMAD4, SMARCA4, SMARCB1, SMARCE1, STK11, SUFU, TMEM127, TP53*, TSC1, TSC2, VHL and XRCC2 (sequencing and deletion/duplication); EGFR, EGLN1, HOXB13, KIT, MITF, PDGFRA, POLD1, and POLE (sequencing only); EPCAM and GREM1 (deletion/duplication only). DNA and RNA analyses performed for * genes.       INTERVAL HISTORY:  [name] [name] is here for a follow up of metastatic breast cancer. She was last seen by PA Cassie on 10/19/21. She presents to the clinic alone. She reports she has been doing well overall. She notes she has been dealing with allergies and subsequent sinus infection.   All other systems were reviewed with the patient and are negative.  MEDICAL HISTORY:  Past Medical History:  Diagnosis Date   Breast cancer ([place])    Breast cancer ([place])    CHF (congestive heart failure) ([place])    Family history of breast cancer    Family history of prostate cancer    GERD (gastroesophageal reflux disease)    during pregnancy    Pneumonia    hx of pneumonia 08/2013    S/P radiation therapy 05/03/2015 through 06/14/2015                                                      Right breast 4680 cGy in 26 sessions, right breast boost 1000 cGy in 5 sessions.  Right supraclavicular/axillary region 4680 cGy with a supplemental PA field to bring the axillary dose up to  4500 cGy in 26 sessions    SURGICAL HISTORY: Past Surgical History:  Procedure Laterality Date   BREAST LUMPECTOMY WITH RADIOACTIVE SEED AND SENTINEL LYMPH NODE BIOPSY Right 03/29/2015   Procedure: BREAST LUMPECTOMY WITH RADIOACTIVE SEED AND SENTINEL LYMPH NODE BIOPSY;  Surgeon: Stark Klein, MD;  Location: [facility];  Service: General;  Laterality: Right;   CESAREAN SECTION     CHOLECYSTECTOMY     ESSURE TUBAL LIGATION     PORTACATH PLACEMENT Left 07/21/2014   Procedure: INSERTION PORT-A-CATH;  Surgeon: Stark Klein, MD;  Location: [facility];  Service: General;  Laterality: Left;   WISDOM TOOTH EXTRACTION      I have reviewed the social history and family history with the patient and they are unchanged from previous note.  ALLERGIES:  is allergic to adhesive [tape], aspirin, and codeine.  MEDICATIONS:  Current Outpatient  Medications  Medication Sig Dispense Refill   acetaminophen (TYLENOL) 500 MG tablet Take 500 mg by mouth every 6 (six) hours as needed.     Ascorbic Acid (VITAMIN C) 500 MG CHEW      Calcium-Phosphorus-Vitamin D (CALCIUM GUMMIES PO) Take by mouth daily.     Camphor-Eucalyptus-Menthol (VICKS VAPORUB EX) Apply 1 application. topically at bedtime. Applies under nose, throat and on chest as needed     cetirizine (ZYRTEC) 10 MG tablet Take 10 mg by mouth daily as needed for allergies (allergies).      Fluocinolone Acetonide Body 0.01 % OIL Apply topically 4 (four) times a week.     fluticasone (FLONASE) 50 MCG/ACT nasal spray Place into both nostrils daily.     gabapentin (NEURONTIN) 100 MG capsule TAKE 2 CAPSULES(200 MG) BY MOUTH AT BEDTIME 60 capsule 3   hydrocortisone 2.5 % cream Apply 1 application topically 2 (two) times daily as needed.     ibuprofen (ADVIL,MOTRIN) 800 MG tablet Take 1 tablet (800 mg total) by mouth every 8 (eight) hours as needed. 60 tablet 1   lidocaine-prilocaine (EMLA) cream Apply as directed prior to chemotherapy 30 g 2    loperamide (IMODIUM A-D) 2 MG tablet Take 1 tablet (2 mg total) by mouth 4 (four) times daily as needed for diarrhea or loose stools. 30 tablet 0   losartan (COZAAR) 25 MG tablet TAKE 1 TABLET(25 MG) BY MOUTH DAILY 90 tablet 3   minoxidil (LONITEN) 2.5 MG tablet Take 2.5 mg by mouth daily. 1/2 tab once daily     Multiple Vitamins-Minerals (MULTIVITAMIN GUMMIES ADULT PO) Take 1 each by mouth daily. Women's Vitafusion gummie     spironolactone (ALDACTONE) 25 MG tablet Take 1 tablet (25 mg total) by mouth daily. 90 tablet 3   No current facility-administered medications for this visit.   Facility-Administered Medications Ordered in Other Visits  Medication Dose Route Frequency Provider Last Rate Last Admin   acetaminophen (TYLENOL) tablet 650 mg  650 mg Oral Once [name], MD       heparin lock flush 100 unit/mL  500 Units Intracatheter Once PRN [name], MD       sodium chloride 0.9 % injection 10 mL  10 mL Intracatheter PRN [name], MD   10 mL at 05/01/16 1622   sodium chloride 0.9 % injection 10 mL  10 mL Intravenous PRN [name], MD       sodium chloride 0.9 % injection 10 mL  10 mL Intracatheter PRN [name], MD   10 mL at 03/18/19 1715   sodium chloride flush (NS) 0.9 % injection 10 mL  10 mL Intravenous PRN [name], MD        PHYSICAL EXAMINATION: ECOG PERFORMANCE STATUS: 0 - Asymptomatic  Vitals:   01/25/22 1451  BP: (!) 142/76  Pulse: 85  Resp: 18  Temp: 98.7 F (37.1 C)  SpO2: 100%   Wt Readings from Last 3 Encounters:  01/25/22 228 lb 9.6 oz (103.7 kg)  01/04/22 224 lb 8 oz (101.8 kg)  12/04/21 231 lb 8 oz (105 kg)     GENERAL:alert, no distress and comfortable SKIN: skin color normal, no rashes or significant lesions EYES: normal, Conjunctiva are pink and non-injected, sclera clear  NEURO: alert & oriented x 3 with fluent speech  LABORATORY DATA:  I have reviewed the data as listed    Latest Ref Rng & Units 01/25/2022    2:10 PM 12/04/2021    2:12 PM  10/19/2021    7:36 AM  CBC  WBC 4.0 - 10.5 K/uL 6.7  7.3  6.7   Hemoglobin 12.0 - 15.0 g/dL 11.7  11.9  11.6   Hematocrit 36.0 - 46.0 % 35.2  36.2  34.8   Platelets 150 - 400 K/uL 222  256  222         Latest Ref Rng & Units 01/25/2022    2:10 PM 12/04/2021    2:12 PM 10/19/2021    7:36 AM  CMP  Glucose 70 - 99 mg/dL 142  111  134   BUN 6 - 20 mg/dL _0 Creatinine 0.44 - 1.00 mg/dL 0.96  0.86  0.79   Sodium 135 - 145 mmol/L 140  141  138   Potassium 3.5 - 5.1 mmol/L 3.3  3.7  3.9   Chloride 98 - 111 mmol/L 104  102  103   CO2 22 - 32 mmol/L _1 Calcium 8.9 - 10.3 mg/dL 9.3  9.0  9.0   Total Protein 6.5 - 8.1 g/dL 7.8  7.9  7.4   Total Bilirubin 0.3 - 1.2 mg/dL 0.5  0.4  0.5   Alkaline Phos 38 - 126 U/L 68  65  61   AST 15 - 41 U/L _2 ALT 0 - 44 U/L _3 RADIOGRAPHIC STUDIES: I have personally reviewed the radiological images as listed and agreed with the findings in the report. No results found.    Orders Placed This Encounter  Procedures   DG Bone Density    Standing Status:   Future    Standing Expiration Date:   01/25/2023    Scheduling Instructions:     [facility]    Order Specific Question:   Reason for Exam (SYMPTOM  OR DIAGNOSIS REQUIRED)    Answer:   screening    Order Specific Question:   Is the patient pregnant?    Answer:   No    Order Specific Question:   Preferred imaging location?    Answer:   External   All questions were answered. The patient knows to call the clinic with any problems, questions or concerns. No barriers to learning was detected. The total time spent in the appointment was 30 minutes.     [name], MD 01/25/2022   I, Wilburn Mylar, am acting as scribe for [name], MD.   I have reviewed the above documentation for accuracy and completeness, and I agree with the above.

## 2022-01-29 ENCOUNTER — Other Ambulatory Visit: Payer: Self-pay | Admitting: Hematology

## 2022-01-30 ENCOUNTER — Telehealth: Payer: Self-pay | Admitting: Hematology

## 2022-01-30 NOTE — Telephone Encounter (Signed)
Left message with follow-up appointments per 6/15 los.

## 2022-02-15 ENCOUNTER — Inpatient Hospital Stay: Payer: BC Managed Care – PPO | Attending: Hematology

## 2022-02-15 ENCOUNTER — Other Ambulatory Visit: Payer: Self-pay

## 2022-02-15 VITALS — BP 149/67 | HR 95 | Temp 98.7°F | Resp 18 | Wt 226.5 lb

## 2022-02-15 DIAGNOSIS — C50311 Malignant neoplasm of lower-inner quadrant of right female breast: Secondary | ICD-10-CM | POA: Diagnosis not present

## 2022-02-15 DIAGNOSIS — Z452 Encounter for adjustment and management of vascular access device: Secondary | ICD-10-CM | POA: Insufficient documentation

## 2022-02-15 DIAGNOSIS — C50911 Malignant neoplasm of unspecified site of right female breast: Secondary | ICD-10-CM

## 2022-02-15 DIAGNOSIS — Z5112 Encounter for antineoplastic immunotherapy: Secondary | ICD-10-CM | POA: Insufficient documentation

## 2022-02-15 MED ORDER — TRASTUZUMAB-HYALURONIDASE-OYSK 600-10000 MG-UNT/5ML ~~LOC~~ SOLN
600.0000 mg | Freq: Once | SUBCUTANEOUS | Status: AC
  Administered 2022-02-15: 600 mg via SUBCUTANEOUS
  Filled 2022-02-15: qty 5

## 2022-02-15 MED ORDER — SODIUM CHLORIDE 0.9 % IV SOLN: Freq: Once | INTRAVENOUS | Status: DC

## 2022-02-15 NOTE — Progress Notes (Signed)
Per Dr. Burr Medico, "OK To Treat w/HR 107" today.

## 2022-03-05 ENCOUNTER — Other Ambulatory Visit: Payer: Self-pay

## 2022-03-06 ENCOUNTER — Other Ambulatory Visit: Payer: Self-pay

## 2022-03-07 ENCOUNTER — Other Ambulatory Visit: Payer: Self-pay

## 2022-03-08 ENCOUNTER — Inpatient Hospital Stay: Payer: BC Managed Care – PPO

## 2022-03-08 ENCOUNTER — Other Ambulatory Visit: Payer: Self-pay

## 2022-03-08 VITALS — BP 135/70 | HR 85 | Temp 98.4°F | Resp 17 | Wt 228.5 lb

## 2022-03-08 DIAGNOSIS — C50911 Malignant neoplasm of unspecified site of right female breast: Secondary | ICD-10-CM

## 2022-03-08 DIAGNOSIS — Z95828 Presence of other vascular implants and grafts: Secondary | ICD-10-CM

## 2022-03-08 DIAGNOSIS — C50311 Malignant neoplasm of lower-inner quadrant of right female breast: Secondary | ICD-10-CM | POA: Diagnosis not present

## 2022-03-08 DIAGNOSIS — Z452 Encounter for adjustment and management of vascular access device: Secondary | ICD-10-CM | POA: Diagnosis not present

## 2022-03-08 DIAGNOSIS — Z5112 Encounter for antineoplastic immunotherapy: Secondary | ICD-10-CM | POA: Diagnosis not present

## 2022-03-08 MED ORDER — SODIUM CHLORIDE 0.9 % IV SOLN: Freq: Once | INTRAVENOUS | Status: DC

## 2022-03-08 MED ORDER — TRASTUZUMAB-HYALURONIDASE-OYSK 600-10000 MG-UNT/5ML ~~LOC~~ SOLN
600.0000 mg | Freq: Once | SUBCUTANEOUS | Status: AC
  Administered 2022-03-08: 600 mg via SUBCUTANEOUS
  Filled 2022-03-08: qty 5

## 2022-03-08 MED ORDER — HEPARIN SOD (PORK) LOCK FLUSH 100 UNIT/ML IV SOLN
500.0000 [IU] | Freq: Once | INTRAVENOUS | Status: AC | PRN
  Administered 2022-03-08: 500 [IU] via INTRAVENOUS

## 2022-03-08 MED ORDER — SODIUM CHLORIDE 0.9% FLUSH
10.0000 mL | Freq: Once | INTRAVENOUS | Status: AC
  Administered 2022-03-08: 10 mL

## 2022-03-08 NOTE — Patient Instructions (Signed)
[facility] ONCOLOGY  Discharge Instructions: Thank you for choosing [facility] to provide your oncology and hematology care.   If you have a lab appointment with the [facility], please go directly to the [facility] and check in at the registration area.   Wear comfortable clothing and clothing appropriate for easy access to any Portacath or PICC line.   We strive to give you quality time with your provider. You may need to reschedule your appointment if you arrive late (15 or more minutes).  Arriving late affects you and other patients whose appointments are after yours.  Also, if you miss three or more appointments without notifying the office, you may be dismissed from the clinic at the provider's discretion.      For prescription refill requests, have your pharmacy contact our office and allow 72 hours for refills to be completed.    Today you received the following chemotherapy and/or immunotherapy agents: trastuzumab hyaluronidase      To help prevent nausea and vomiting after your treatment, we encourage you to take your nausea medication as directed.  BELOW ARE SYMPTOMS THAT SHOULD BE REPORTED IMMEDIATELY: *FEVER GREATER THAN 100.4 F (38 C) OR HIGHER *CHILLS OR SWEATING *NAUSEA AND VOMITING THAT IS NOT CONTROLLED WITH YOUR NAUSEA MEDICATION *UNUSUAL SHORTNESS OF BREATH *UNUSUAL BRUISING OR BLEEDING *URINARY PROBLEMS (pain or burning when urinating, or frequent urination) *BOWEL PROBLEMS (unusual diarrhea, constipation, pain near the anus) TENDERNESS IN MOUTH AND THROAT WITH OR WITHOUT PRESENCE OF ULCERS (sore throat, sores in mouth, or a toothache) UNUSUAL RASH, SWELLING OR PAIN  UNUSUAL VAGINAL DISCHARGE OR ITCHING   Items with * indicate a potential emergency and should be followed up as soon as possible or go to the Emergency Department if any problems should occur.  Please show the CHEMOTHERAPY ALERT CARD or IMMUNOTHERAPY ALERT CARD  at check-in to the Emergency Department and triage nurse.  Should you have questions after your visit or need to cancel or reschedule your appointment, please contact [facility]  Dept: [phone]  and follow the prompts.  Office hours are 8:00 a.m. to 4:30 p.m. Monday - Friday. Please note that voicemails left after 4:00 p.m. may not be returned until the following business day.  We are closed weekends and major holidays. You have access to a nurse at all times for urgent questions. Please call the main number to the clinic Dept: [phone] and follow the prompts.   For any non-urgent questions, you may also contact your provider using MyChart. We now offer e-Visits for anyone 8 and older to request care online for non-urgent symptoms. For details visit [email or web address].   Also download the MyChart app! Go to the app store, search "MyChart", open the app, select [facility], and log in with your MyChart username and password.  Masks are optional in the cancer centers. If you would like for your care team to wear a mask while they are taking care of you, please let them know. For doctor visits, patients may have with them one support person who is at least 53 years old. At this time, visitors are not allowed in the infusion area.

## 2022-03-10 ENCOUNTER — Other Ambulatory Visit: Payer: Self-pay

## 2022-03-13 ENCOUNTER — Other Ambulatory Visit: Payer: Self-pay

## 2022-03-14 ENCOUNTER — Other Ambulatory Visit: Payer: Self-pay

## 2022-03-16 ENCOUNTER — Other Ambulatory Visit: Payer: Self-pay

## 2022-03-21 ENCOUNTER — Other Ambulatory Visit (HOSPITAL_COMMUNITY): Payer: Self-pay | Admitting: Internal Medicine

## 2022-03-22 ENCOUNTER — Other Ambulatory Visit (HOSPITAL_COMMUNITY): Payer: Self-pay | Admitting: *Deleted

## 2022-03-29 ENCOUNTER — Other Ambulatory Visit: Payer: Self-pay

## 2022-03-29 ENCOUNTER — Inpatient Hospital Stay: Payer: BC Managed Care – PPO | Attending: Hematology

## 2022-03-29 VITALS — BP 109/74 | HR 89 | Temp 98.5°F | Resp 17 | Wt 226.0 lb

## 2022-03-29 DIAGNOSIS — C50311 Malignant neoplasm of lower-inner quadrant of right female breast: Secondary | ICD-10-CM | POA: Diagnosis not present

## 2022-03-29 DIAGNOSIS — Z5112 Encounter for antineoplastic immunotherapy: Secondary | ICD-10-CM | POA: Insufficient documentation

## 2022-03-29 DIAGNOSIS — L658 Other specified nonscarring hair loss: Secondary | ICD-10-CM | POA: Diagnosis not present

## 2022-03-29 DIAGNOSIS — C50911 Malignant neoplasm of unspecified site of right female breast: Secondary | ICD-10-CM

## 2022-03-29 MED ORDER — TRASTUZUMAB-HYALURONIDASE-OYSK 600-10000 MG-UNT/5ML ~~LOC~~ SOLN
600.0000 mg | Freq: Once | SUBCUTANEOUS | Status: AC
  Administered 2022-03-29: 600 mg via SUBCUTANEOUS
  Filled 2022-03-29: qty 5

## 2022-03-29 NOTE — Patient Instructions (Signed)
[facility] ONCOLOGY  Discharge Instructions: Thank you for choosing [facility] to provide your oncology and hematology care.   If you have a lab appointment with the [facility], please go directly to the [facility] and check in at the registration area.   Wear comfortable clothing and clothing appropriate for easy access to any Portacath or PICC line.   We strive to give you quality time with your provider. You may need to reschedule your appointment if you arrive late (15 or more minutes).  Arriving late affects you and other patients whose appointments are after yours.  Also, if you miss three or more appointments without notifying the office, you may be dismissed from the clinic at the provider's discretion.      For prescription refill requests, have your pharmacy contact our office and allow 72 hours for refills to be completed.    Today you received the following chemotherapy and/or immunotherapy agents: herceptin hylecta      To help prevent nausea and vomiting after your treatment, we encourage you to take your nausea medication as directed.  BELOW ARE SYMPTOMS THAT SHOULD BE REPORTED IMMEDIATELY: *FEVER GREATER THAN 100.4 F (38 C) OR HIGHER *CHILLS OR SWEATING *NAUSEA AND VOMITING THAT IS NOT CONTROLLED WITH YOUR NAUSEA MEDICATION *UNUSUAL SHORTNESS OF BREATH *UNUSUAL BRUISING OR BLEEDING *URINARY PROBLEMS (pain or burning when urinating, or frequent urination) *BOWEL PROBLEMS (unusual diarrhea, constipation, pain near the anus) TENDERNESS IN MOUTH AND THROAT WITH OR WITHOUT PRESENCE OF ULCERS (sore throat, sores in mouth, or a toothache) UNUSUAL RASH, SWELLING OR PAIN  UNUSUAL VAGINAL DISCHARGE OR ITCHING   Items with * indicate a potential emergency and should be followed up as soon as possible or go to the Emergency Department if any problems should occur.  Please show the CHEMOTHERAPY ALERT CARD or IMMUNOTHERAPY ALERT CARD at  check-in to the Emergency Department and triage nurse.  Should you have questions after your visit or need to cancel or reschedule your appointment, please contact [facility]  Dept: [phone]  and follow the prompts.  Office hours are 8:00 a.m. to 4:30 p.m. Monday - Friday. Please note that voicemails left after 4:00 p.m. may not be returned until the following business day.  We are closed weekends and major holidays. You have access to a nurse at all times for urgent questions. Please call the main number to the clinic Dept: [phone] and follow the prompts.   For any non-urgent questions, you may also contact your provider using MyChart. We now offer e-Visits for anyone 42 and older to request care online for non-urgent symptoms. For details visit [email or web address].   Also download the MyChart app! Go to the app store, search "MyChart", open the app, select [facility], and log in with your MyChart username and password.  Masks are optional in the cancer centers. If you would like for your care team to wear a mask while they are taking care of you, please let them know. You may have one support person who is at least 53 years old accompany you for your appointments.

## 2022-04-13 ENCOUNTER — Other Ambulatory Visit: Payer: Self-pay

## 2022-04-14 ENCOUNTER — Other Ambulatory Visit: Payer: Self-pay

## 2022-04-19 ENCOUNTER — Inpatient Hospital Stay: Payer: BC Managed Care – PPO

## 2022-04-19 ENCOUNTER — Other Ambulatory Visit: Payer: Self-pay

## 2022-04-19 ENCOUNTER — Inpatient Hospital Stay: Payer: BC Managed Care – PPO | Attending: Hematology

## 2022-04-19 VITALS — BP 134/76 | HR 79 | Temp 98.8°F | Resp 18

## 2022-04-19 DIAGNOSIS — C50311 Malignant neoplasm of lower-inner quadrant of right female breast: Secondary | ICD-10-CM | POA: Diagnosis not present

## 2022-04-19 DIAGNOSIS — Z95828 Presence of other vascular implants and grafts: Secondary | ICD-10-CM

## 2022-04-19 DIAGNOSIS — C50911 Malignant neoplasm of unspecified site of right female breast: Secondary | ICD-10-CM

## 2022-04-19 DIAGNOSIS — Z5112 Encounter for antineoplastic immunotherapy: Secondary | ICD-10-CM | POA: Insufficient documentation

## 2022-04-19 LAB — CBC WITH DIFFERENTIAL (CANCER CENTER ONLY)
Abs Immature Granulocytes: 0.01 10*3/uL (ref 0.00–0.07)
Basophils Absolute: 0 10*3/uL (ref 0.0–0.1)
Basophils Relative: 0 %
Eosinophils Absolute: 0.2 10*3/uL (ref 0.0–0.5)
Eosinophils Relative: 3 %
HCT: 33.9 % — ABNORMAL LOW (ref 36.0–46.0)
Hemoglobin: 11.5 g/dL — ABNORMAL LOW (ref 12.0–15.0)
Immature Granulocytes: 0 %
Lymphocytes Relative: 29 %
Lymphs Abs: 1.9 10*3/uL (ref 0.7–4.0)
MCH: 29.9 pg (ref 26.0–34.0)
MCHC: 33.9 g/dL (ref 30.0–36.0)
MCV: 88.3 fL (ref 80.0–100.0)
Monocytes Absolute: 0.5 10*3/uL (ref 0.1–1.0)
Monocytes Relative: 8 %
Neutro Abs: 3.7 10*3/uL (ref 1.7–7.7)
Neutrophils Relative %: 60 %
Platelet Count: 215 10*3/uL (ref 150–400)
RBC: 3.84 MIL/uL — ABNORMAL LOW (ref 3.87–5.11)
RDW: 13.5 % (ref 11.5–15.5)
WBC Count: 6.3 10*3/uL (ref 4.0–10.5)
nRBC: 0 % (ref 0.0–0.2)

## 2022-04-19 LAB — CMP (CANCER CENTER ONLY)
ALT: 14 U/L (ref 0–44)
AST: 9 U/L — ABNORMAL LOW (ref 15–41)
Albumin: 3.8 g/dL (ref 3.5–5.0)
Alkaline Phosphatase: 66 U/L (ref 38–126)
Anion gap: 3 — ABNORMAL LOW (ref 5–15)
BUN: 13 mg/dL (ref 6–20)
CO2: 30 mmol/L (ref 22–32)
Calcium: 9.3 mg/dL (ref 8.9–10.3)
Chloride: 104 mmol/L (ref 98–111)
Creatinine: 0.85 mg/dL (ref 0.44–1.00)
GFR, Estimated: >60 mL/min (ref >60)
Glucose, Bld: 132 mg/dL — ABNORMAL HIGH (ref 70–99)
Potassium: 3.6 mmol/L (ref 3.5–5.1)
Sodium: 137 mmol/L (ref 135–145)
Total Bilirubin: 0.3 mg/dL (ref 0.3–1.2)
Total Protein: 7.5 g/dL (ref 6.5–8.1)

## 2022-04-19 MED ORDER — SODIUM CHLORIDE 0.9% FLUSH
10.0000 mL | Freq: Once | INTRAVENOUS | Status: AC
  Administered 2022-04-19: 10 mL

## 2022-04-19 MED ORDER — HEPARIN SOD (PORK) LOCK FLUSH 100 UNIT/ML IV SOLN
500.0000 [IU] | Freq: Once | INTRAVENOUS | Status: AC | PRN
  Administered 2022-04-19: 500 [IU] via INTRAVENOUS

## 2022-04-19 MED ORDER — TRASTUZUMAB-HYALURONIDASE-OYSK 600-10000 MG-UNT/5ML ~~LOC~~ SOLN
600.0000 mg | Freq: Once | SUBCUTANEOUS | Status: AC
  Administered 2022-04-19: 600 mg via SUBCUTANEOUS
  Filled 2022-04-19: qty 5

## 2022-04-20 DIAGNOSIS — C50919 Malignant neoplasm of unspecified site of unspecified female breast: Secondary | ICD-10-CM | POA: Diagnosis not present

## 2022-04-21 ENCOUNTER — Other Ambulatory Visit: Payer: Self-pay

## 2022-04-24 DIAGNOSIS — C50919 Malignant neoplasm of unspecified site of unspecified female breast: Secondary | ICD-10-CM | POA: Diagnosis not present

## 2022-05-04 ENCOUNTER — Telehealth: Payer: Self-pay | Admitting: Hematology

## 2022-05-04 NOTE — Telephone Encounter (Signed)
Unable to leave message with rescheduled upcoming appointment due to provider's PAL. Mailed calendar.

## 2022-05-05 ENCOUNTER — Other Ambulatory Visit: Payer: Self-pay

## 2022-05-10 ENCOUNTER — Inpatient Hospital Stay: Payer: BC Managed Care – PPO

## 2022-05-10 VITALS — BP 141/77 | HR 99 | Temp 98.4°F | Resp 17 | Wt 234.5 lb

## 2022-05-10 DIAGNOSIS — Z5112 Encounter for antineoplastic immunotherapy: Secondary | ICD-10-CM | POA: Diagnosis not present

## 2022-05-10 DIAGNOSIS — C50911 Malignant neoplasm of unspecified site of right female breast: Secondary | ICD-10-CM

## 2022-05-10 DIAGNOSIS — C50311 Malignant neoplasm of lower-inner quadrant of right female breast: Secondary | ICD-10-CM | POA: Diagnosis not present

## 2022-05-10 MED ORDER — TRASTUZUMAB-HYALURONIDASE-OYSK 600-10000 MG-UNT/5ML ~~LOC~~ SOLN
600.0000 mg | Freq: Once | SUBCUTANEOUS | Status: AC
  Administered 2022-05-10: 600 mg via SUBCUTANEOUS
  Filled 2022-05-10: qty 5

## 2022-05-10 NOTE — Patient Instructions (Signed)
[facility]  Discharge Instructions: Thank you for choosing [facility] to provide your oncology and hematology care.   If you have a lab appointment with the [facility], please go directly to the [facility] and check in at the registration area.   Wear comfortable clothing and clothing appropriate for easy access to any Portacath or PICC line.   We strive to give you quality time with your provider. You may need to reschedule your appointment if you arrive late (15 or more minutes).  Arriving late affects you and other patients whose appointments are after yours.  Also, if you miss three or more appointments without notifying the office, you may be dismissed from the clinic at the provider's discretion.      For prescription refill requests, have your pharmacy contact our office and allow 72 hours for refills to be completed.    Today you received the following chemotherapy and/or immunotherapy agents: trastuzumab-hyaluronidase      To help prevent nausea and vomiting after your treatment, we encourage you to take your nausea medication as directed.  BELOW ARE SYMPTOMS THAT SHOULD BE REPORTED IMMEDIATELY: *FEVER GREATER THAN 100.4 F (38 C) OR HIGHER *CHILLS OR SWEATING *NAUSEA AND VOMITING THAT IS NOT CONTROLLED WITH YOUR NAUSEA MEDICATION *UNUSUAL SHORTNESS OF BREATH *UNUSUAL BRUISING OR BLEEDING *URINARY PROBLEMS (pain or burning when urinating, or frequent urination) *BOWEL PROBLEMS (unusual diarrhea, constipation, pain near the anus) TENDERNESS IN MOUTH AND THROAT WITH OR WITHOUT PRESENCE OF ULCERS (sore throat, sores in mouth, or a toothache) UNUSUAL RASH, SWELLING OR PAIN  UNUSUAL VAGINAL DISCHARGE OR ITCHING   Items with * indicate a potential emergency and should be followed up as soon as possible or go to the Emergency Department if any problems should occur.  Please show the CHEMOTHERAPY ALERT CARD or IMMUNOTHERAPY ALERT CARD  at check-in to the Emergency Department and triage nurse.  Should you have questions after your visit or need to cancel or reschedule your appointment, please contact [facility]  Dept: [phone]  and follow the prompts.  Office hours are 8:00 a.m. to 4:30 p.m. Monday - Friday. Please note that voicemails left after 4:00 p.m. may not be returned until the following business day.  We are closed weekends and major holidays. You have access to a nurse at all times for urgent questions. Please call the main number to the clinic Dept: [phone] and follow the prompts.   For any non-urgent questions, you may also contact your provider using MyChart. We now offer e-Visits for anyone 58 and older to request care online for non-urgent symptoms. For details visit [email or web address].   Also download the MyChart app! Go to the app store, search "MyChart", open the app, select [facility], and log in with your MyChart username and password.  Masks are optional in the cancer centers. If you would like for your care team to wear a mask while they are taking care of you, please let them know. You may have one support person who is at least 53 years old accompany you for your appointments.

## 2022-05-14 ENCOUNTER — Other Ambulatory Visit: Payer: Self-pay

## 2022-05-31 ENCOUNTER — Inpatient Hospital Stay: Payer: BC Managed Care – PPO | Attending: Hematology

## 2022-05-31 ENCOUNTER — Encounter: Payer: Self-pay | Admitting: Adult Health

## 2022-05-31 ENCOUNTER — Inpatient Hospital Stay: Payer: BC Managed Care – PPO

## 2022-05-31 ENCOUNTER — Inpatient Hospital Stay (HOSPITAL_BASED_OUTPATIENT_CLINIC_OR_DEPARTMENT_OTHER): Payer: BC Managed Care – PPO | Admitting: Adult Health

## 2022-05-31 ENCOUNTER — Inpatient Hospital Stay: Payer: BC Managed Care – PPO | Admitting: Hematology

## 2022-05-31 ENCOUNTER — Other Ambulatory Visit: Payer: Self-pay | Admitting: *Deleted

## 2022-05-31 VITALS — BP 153/79 | HR 94 | Temp 97.2°F | Resp 18 | Ht 70.0 in | Wt 234.3 lb

## 2022-05-31 DIAGNOSIS — Z923 Personal history of irradiation: Secondary | ICD-10-CM | POA: Diagnosis not present

## 2022-05-31 DIAGNOSIS — Z8042 Family history of malignant neoplasm of prostate: Secondary | ICD-10-CM | POA: Insufficient documentation

## 2022-05-31 DIAGNOSIS — T451X5A Adverse effect of antineoplastic and immunosuppressive drugs, initial encounter: Secondary | ICD-10-CM | POA: Diagnosis not present

## 2022-05-31 DIAGNOSIS — G620 Drug-induced polyneuropathy: Secondary | ICD-10-CM

## 2022-05-31 DIAGNOSIS — Z803 Family history of malignant neoplasm of breast: Secondary | ICD-10-CM | POA: Insufficient documentation

## 2022-05-31 DIAGNOSIS — Z800 Family history of malignant neoplasm of digestive organs: Secondary | ICD-10-CM | POA: Insufficient documentation

## 2022-05-31 DIAGNOSIS — T451X5D Adverse effect of antineoplastic and immunosuppressive drugs, subsequent encounter: Secondary | ICD-10-CM | POA: Diagnosis not present

## 2022-05-31 DIAGNOSIS — Z171 Estrogen receptor negative status [ER-]: Secondary | ICD-10-CM | POA: Diagnosis not present

## 2022-05-31 DIAGNOSIS — C7800 Secondary malignant neoplasm of unspecified lung: Secondary | ICD-10-CM

## 2022-05-31 DIAGNOSIS — C50911 Malignant neoplasm of unspecified site of right female breast: Secondary | ICD-10-CM | POA: Diagnosis not present

## 2022-05-31 DIAGNOSIS — Z79899 Other long term (current) drug therapy: Secondary | ICD-10-CM | POA: Diagnosis not present

## 2022-05-31 DIAGNOSIS — Z5112 Encounter for antineoplastic immunotherapy: Secondary | ICD-10-CM | POA: Insufficient documentation

## 2022-05-31 DIAGNOSIS — C50311 Malignant neoplasm of lower-inner quadrant of right female breast: Secondary | ICD-10-CM | POA: Insufficient documentation

## 2022-05-31 DIAGNOSIS — Z95828 Presence of other vascular implants and grafts: Secondary | ICD-10-CM

## 2022-05-31 LAB — CMP (CANCER CENTER ONLY)
ALT: 25 U/L (ref 0–44)
AST: 15 U/L (ref 15–41)
Albumin: 4 g/dL (ref 3.5–5.0)
Alkaline Phosphatase: 68 U/L (ref 38–126)
Anion gap: 7 (ref 5–15)
BUN: 16 mg/dL (ref 6–20)
CO2: 27 mmol/L (ref 22–32)
Calcium: 9.1 mg/dL (ref 8.9–10.3)
Chloride: 105 mmol/L (ref 98–111)
Creatinine: 0.83 mg/dL (ref 0.44–1.00)
GFR, Estimated: >60 mL/min (ref >60)
Glucose, Bld: 119 mg/dL — ABNORMAL HIGH (ref 70–99)
Potassium: 3.7 mmol/L (ref 3.5–5.1)
Sodium: 139 mmol/L (ref 135–145)
Total Bilirubin: 0.5 mg/dL (ref 0.3–1.2)
Total Protein: 7.9 g/dL (ref 6.5–8.1)

## 2022-05-31 LAB — CBC WITH DIFFERENTIAL (CANCER CENTER ONLY)
Abs Immature Granulocytes: 0.01 10*3/uL (ref 0.00–0.07)
Basophils Absolute: 0 10*3/uL (ref 0.0–0.1)
Basophils Relative: 1 %
Eosinophils Absolute: 0.1 10*3/uL (ref 0.0–0.5)
Eosinophils Relative: 2 %
HCT: 36.6 % (ref 36.0–46.0)
Hemoglobin: 12.5 g/dL (ref 12.0–15.0)
Immature Granulocytes: 0 %
Lymphocytes Relative: 34 %
Lymphs Abs: 2.2 10*3/uL (ref 0.7–4.0)
MCH: 30.3 pg (ref 26.0–34.0)
MCHC: 34.2 g/dL (ref 30.0–36.0)
MCV: 88.8 fL (ref 80.0–100.0)
Monocytes Absolute: 0.4 10*3/uL (ref 0.1–1.0)
Monocytes Relative: 7 %
Neutro Abs: 3.7 10*3/uL (ref 1.7–7.7)
Neutrophils Relative %: 56 %
Platelet Count: 254 10*3/uL (ref 150–400)
RBC: 4.12 MIL/uL (ref 3.87–5.11)
RDW: 13.3 % (ref 11.5–15.5)
WBC Count: 6.4 10*3/uL (ref 4.0–10.5)
nRBC: 0 % (ref 0.0–0.2)

## 2022-05-31 MED ORDER — SODIUM CHLORIDE 0.9% FLUSH
10.0000 mL | Freq: Once | INTRAVENOUS | Status: AC
  Administered 2022-05-31: 10 mL

## 2022-05-31 MED ORDER — TRASTUZUMAB-HYALURONIDASE-OYSK 600-10000 MG-UNT/5ML ~~LOC~~ SOLN
600.0000 mg | Freq: Once | SUBCUTANEOUS | Status: AC
  Administered 2022-05-31: 600 mg via SUBCUTANEOUS
  Filled 2022-05-31: qty 5

## 2022-05-31 MED ORDER — GABAPENTIN 100 MG PO CAPS: ORAL_CAPSULE | ORAL | 3 refills | Status: DC

## 2022-05-31 MED ORDER — HEPARIN SOD (PORK) LOCK FLUSH 100 UNIT/ML IV SOLN
500.0000 [IU] | Freq: Once | INTRAVENOUS | Status: AC | PRN
  Administered 2022-05-31: 500 [IU] via INTRAVENOUS

## 2022-05-31 NOTE — Progress Notes (Signed)
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 MRI breast on 07/16/2014 showed 7cm R breast lesion and 4.4cm r axillary node    07/02/2014 Imaging   CT CAP: a 4.7cm mass in LUL lung and a 2.1cm mas in RML, and a small nodule in RUL, suspecious for metastasis     07/09/2014 Initial Diagnosis   right IDA with b/l lung lesions, ER-/PR-/HER2+   07/09/2014 Initial Biopsy   US guided right breast mass and axillary node biopsy showed IDA, and DCIS, ER-/PR-/HER2+   07/26/2014 Pathologic Stage   Left lung mass by IR, path revealed high grade carcinoma, morphology similar to breast tumor biopsy, TTF(-), NapsinA(-), ER(-)   08/04/2014 - 03/22/2015 Chemotherapy   weekly Paclitaxel 63m/m2, trastuzumab and pertuzumab every 3 weeks   08/30/2014 Genetic Testing   BreastNext panel was negative. 17 genes including BRCA1, BRCA2, were negative for mutations.    10/04/2014 Imaging   Interval decrease in the right axillary lymphadenopathy. Bilateral pulmonary lesions with left hilar lymphadenopathy also markedly decreased in the interval. The left hilar lymphadenopathy has resolved.   12/20/2014 Imaging   restaging CT showed stable disease, no new lesions    03/29/2015 Pathology Results    right breast  lumpectomy showed  chemotherapy treatment effect,  a 1 mm residual tumor,   margins were widely negative, 5 sentinel lymph nodes and 2 axillary lymph nodes were negative.    03/29/2015 Surgery    right breast lumpectomy and sentinel lymph node biopsy  by Dr. BBarry Dienes  04/12/2015 -  Chemotherapy   Herceptin maintenance therapy , 6 mg/kg, every 3 weeks since 04/12/15. Switched to Herceptin Hylecta injection q3weeks after 06/10/19.   05/03/2015 - 06/14/2015 Radiation Therapy   right breast adjuvant irradiation by Dr. MValere Dross   08/28/2016 Imaging   CT CAP w Contrast  IMPRESSION: 1. No acute process or evidence of metastatic disease within the chest, abdomen, or pelvis. 2. Similar to less well-defined left upper lobe density, likely scarring. No evidence of new or progressive pulmonary metastasis. 3. Right nephrolithiasis.   01/22/2017 Imaging   CT Chest W Contrast 01/22/17 IMPRESSION: 1. Stable exam. No new or progressive findings. No evidence for metastatic disease. 2. Left upper lobe architectural distortion/scarring is stable. 3. Nonobstructing right renal stone.   07/31/2017 Imaging   Bone Scan Whole Body 07/31/17  IMPRESSION: 1. No scintigraphic evidence of osseous metastatic disease. 2. Thoracolumbar scoliosis.    07/31/2017 Imaging   CT CAP W Contrast 07/31/17 IMPRESSION: 1. No findings of active or recurrent malignancy. 2. Other imaging findings of potential clinical significance: Aortic Atherosclerosis (ICD10-I70.0). Mild cardiomegaly. Postoperative and radiation therapy findings in the right chest. Nonobstructive right nephrolithiasis. Thoracolumbar scoliosis.     01/20/2018 Echocardiogram   ECHO 01/20/2018 Study Conclusions   -  Left ventricle: The cavity size was normal. Wall thickness was   normal. Systolic function was normal. The estimated ejection   fraction was in the range of 55% to 60%. Wall motion was normal;   there were no regional wall motion abnormalities.  Doppler   parameters are consistent with abnormal left ventricular   relaxation (grade 1 diastolic dysfunction). - Impressions: GLS -20.7% LS&' 10.4 cm.    02/17/2018 Imaging   02/17/2018 CT CAP IMPRESSION: 1. No evidence for residual or recurrent tumor or metastatic disease. 2.  Aortic Atherosclerosis (ICD10-I70.0). 3. Right renal calculi. 4. Scoliosis.   08/11/2018 Imaging   CT CAP W Contrast 08/11/18  IMPRESSION: No evidence for localized recurrence or metastatic disease in the chest, abdomen or pelvis.   02/23/2019 Imaging   CT CAP W Contrast  IMPRESSION: 1. No findings of active malignancy in the chest, abdomen, or pelvis. 2. 3 mm right kidney lower pole nonobstructive renal calculus. 3. Thoracolumbar scoliosis.   12/08/2019 Imaging   CT CAP W contrast  IMPRESSION: 1. Unchanged post treatment appearance of the lungs. No evidence of recurrent or new metastatic disease in the chest, abdomen, or pelvis.   2. Postoperative findings of right lumpectomy and axillary lymph node dissection.   3.  Nonobstructive right nephrolithiasis.   11/17/2020 Imaging   CT CAP IMPRESSION: 1. No evidence of recurrence or new metastatic disease within the chest, abdomen, or pelvis. 2. Similar post treatment changes in the lungs and postoperative findings of right lumpectomy and axillary lymph node dissection. 3. Colonic diverticulosis without findings of acute diverticulitis.   08/10/2021 Genetic Testing   Negative genetic testing on the CancerNext-Expanded+RNAinsight panel.  The report date is August 10, 2021.  The CancerNext-Expanded gene panel offered by [facility] and includes sequencing and rearrangement analysis for the following 77 genes: AIP, ALK, APC*, ATM*, AXIN2, BAP1, BARD1, BLM, BMPR1A, BRCA1*, BRCA2*, BRIP1*, CDC73, CDH1*, CDK4, CDKN1B, CDKN2A, CHEK2*, CTNNA1, DICER1, FANCC, FH, FLCN, GALNT12, KIF1B, LZTR1, MAX, MEN1, MET, MLH1*, MSH2*, MSH3, MSH6*, MUTYH*, NBN, NF1*,  NF2, NTHL1, PALB2*, PHOX2B, PMS2*, POT1, PRKAR1A, PTCH1, PTEN*, RAD51C*, RAD51D*, RB1, RECQL, RET, SDHA, SDHAF2, SDHB, SDHC, SDHD, SMAD4, SMARCA4, SMARCB1, SMARCE1, STK11, SUFU, TMEM127, TP53*, TSC1, TSC2, VHL and XRCC2 (sequencing and deletion/duplication); EGFR, EGLN1, HOXB13, KIT, MITF, PDGFRA, POLD1, and POLE (sequencing only); EPCAM and GREM1 (deletion/duplication only). DNA and RNA analyses performed for * genes.      CURRENT THERAPY: Herceptin  INTERVAL HISTORY: [name] [name] 51 y.o. female returns for f/u prior to receiving Herceptin given every 3 weeks for her h/o metastatic breast cancer.    She notes that her neuropathy flaring over past 6-7 months.  It is becoming more frequent and intense.  NO pain just discomfort.  She takes 249m PO BID with minimal relief.  [name] [name] most recent restaging scans occurred on 01/16/2022 and showed no evidence of metastatic disease within the chest/abdomen/pelvis.  Her most recent echocardiogram was completed on 11/22/2021 and showed a LVEF of 60-65%.  She undergoes repeat echocardiograms approximately every 6 months.    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  MEDICAL HISTORY: Past Medical History:  Diagnosis Date   Breast cancer ([facility])    Breast cancer ([facility])    CHF (congestive heart failure) ([facility])    Family history  of breast cancer    Family history of prostate cancer    GERD (gastroesophageal reflux disease)    during pregnancy    Pneumonia    hx of pneumonia 08/2013    S/P radiation therapy 05/03/2015 through 06/14/2015                                                      Right breast 4680 cGy in 26 sessions, right breast boost 1000 cGy in 5 sessions.  Right supraclavicular/axillary region 4680 cGy with a supplemental PA field to bring the axillary dose up to 4500 cGy in 26 sessions    SURGICAL HISTORY: Past Surgical History:  Procedure Laterality Date   BREAST LUMPECTOMY WITH RADIOACTIVE SEED AND SENTINEL LYMPH NODE BIOPSY Right 03/29/2015   Procedure: BREAST LUMPECTOMY WITH RADIOACTIVE SEED AND SENTINEL LYMPH NODE BIOPSY;  Surgeon: Stark Klein, MD;  Location: [facility];  Service: General;  Laterality: Right;   CESAREAN SECTION     CHOLECYSTECTOMY     ESSURE TUBAL LIGATION     PORTACATH PLACEMENT Left 07/21/2014   Procedure: INSERTION PORT-A-CATH;  Surgeon: Stark Klein, MD;  Location: [facility];  Service: General;  Laterality: Left;   WISDOM TOOTH EXTRACTION      SOCIAL HISTORY: Social History   Socioeconomic History   Marital status: Married    Spouse name: Not on file   Number of children: Not on file   Years of education: Not on file   Highest education level: Not on file  Occupational History   Not on file  Tobacco Use   Smoking status: Never   Smokeless tobacco: Never  Vaping Use   Vaping Use: Never used  Substance and Sexual Activity   Alcohol use: Yes    Comment: 2-3 drinks per week    Drug use: No   Sexual activity: Never    Birth control/protection: Surgical  Other Topics Concern   Not on file  Social History Narrative   Not on file   Social Determinants of Health   Financial Resource Strain: Not on file  Food Insecurity: Not on file  Transportation Needs: Not on file  Physical Activity: Not on file  Stress: Not on file  Social Connections: Not on  file  Intimate Partner Violence: Not on file    FAMILY HISTORY: Family History  Problem Relation Age of Onset   Breast cancer Mother 100   Breast cancer Maternal Aunt 72   Breast cancer Maternal Aunt 59   Prostate cancer Maternal Uncle 73   Prostate cancer Maternal Uncle 68   Prostate cancer Maternal Grandfather 76   Liver cancer Paternal Grandmother    Prostate cancer Paternal Grandfather    Breast cancer Cousin 56       mat first cousin    Review of Systems  Constitutional:  Negative for appetite change, chills, fatigue, fever and unexpected weight  change.  HENT:   Negative for hearing loss, lump/mass and trouble swallowing.   Eyes:  Negative for eye problems and icterus.  Respiratory:  Negative for chest tightness, cough and shortness of breath.   Cardiovascular:  Negative for chest pain, leg swelling and palpitations.  Gastrointestinal:  Negative for abdominal distention, abdominal pain, constipation, diarrhea, nausea and vomiting.  Endocrine: Negative for hot flashes.  Genitourinary:  Negative for difficulty urinating.   Musculoskeletal:  Negative for arthralgias.  Skin:  Negative for itching and rash.  Neurological:  Positive for numbness. Negative for dizziness, extremity weakness and headaches.  Hematological:  Negative for adenopathy. Does not bruise/bleed easily.  Psychiatric/Behavioral:  Negative for depression. The patient is not nervous/anxious.       PHYSICAL EXAMINATION  ECOG PERFORMANCE STATUS: 1 - Symptomatic but completely ambulatory  Vitals:   05/31/22 1046  BP: (!) 153/79  Pulse: 94  Resp: 18  Temp: (!) 97.2 F (36.2 C)  SpO2: 98%    Physical Exam Constitutional:      General: She is not in acute distress.    Appearance: Normal appearance. She is not toxic-appearing.  HENT:     Head: Normocephalic and atraumatic.  Eyes:     General: No scleral icterus. Cardiovascular:     Rate and Rhythm: Normal rate and regular rhythm.     Pulses: Normal  pulses.     Heart sounds: Normal heart sounds.  Pulmonary:     Effort: Pulmonary effort is normal.     Breath sounds: Normal breath sounds.  Abdominal:     General: Abdomen is flat. Bowel sounds are normal. There is no distension.     Palpations: Abdomen is soft.     Tenderness: There is no abdominal tenderness.  Musculoskeletal:        General: No swelling.     Cervical back: Neck supple.  Lymphadenopathy:     Cervical: No cervical adenopathy.  Skin:    General: Skin is warm and dry.     Findings: No rash.  Neurological:     General: No focal deficit present.     Mental Status: She is alert.  Psychiatric:        Mood and Affect: Mood normal.        Behavior: Behavior normal.     LABORATORY DATA:  CBC    Component Value Date/Time   WBC 6.4 05/31/2022 1021   WBC 5.9 09/07/2021 1024   RBC 4.12 05/31/2022 1021   HGB 12.5 05/31/2022 1021   HGB 11.9 08/01/2017 1305   HCT 36.6 05/31/2022 1021   HCT 36.3 08/01/2017 1305   PLT 254 05/31/2022 1021   PLT 219 08/01/2017 1305   MCV 88.8 05/31/2022 1021   MCV 90.5 08/01/2017 1305   MCH 30.3 05/31/2022 1021   MCHC 34.2 05/31/2022 1021   RDW 13.3 05/31/2022 1021   RDW 13.7 08/01/2017 1305   LYMPHSABS 2.2 05/31/2022 1021   LYMPHSABS 2.1 08/01/2017 1305   MONOABS 0.4 05/31/2022 1021   MONOABS 0.5 08/01/2017 1305   EOSABS 0.1 05/31/2022 1021   EOSABS 0.1 08/01/2017 1305   BASOSABS 0.0 05/31/2022 1021   BASOSABS 0.0 08/01/2017 1305    CMP     Component Value Date/Time   NA 139 05/31/2022 1021   NA 141 08/01/2017 1305   K 3.7 05/31/2022 1021   K 3.6 08/01/2017 1305   CL 105 05/31/2022 1021   CO2 27 05/31/2022 1021   CO2 27 08/01/2017 1305  GLUCOSE 119 (H) 05/31/2022 1021   GLUCOSE 108 08/01/2017 1305   BUN 16 05/31/2022 1021   BUN 10.5 08/01/2017 1305   CREATININE 0.83 05/31/2022 1021   CREATININE 0.9 08/01/2017 1305   CALCIUM 9.1 05/31/2022 1021   CALCIUM 9.4 08/01/2017 1305   PROT 7.9 05/31/2022 1021   PROT  7.6 08/01/2017 1305   ALBUMIN 4.0 05/31/2022 1021   ALBUMIN 3.6 08/01/2017 1305   AST 15 05/31/2022 1021   AST 21 08/01/2017 1305   ALT 25 05/31/2022 1021   ALT 26 08/01/2017 1305   ALKPHOS 68 05/31/2022 1021   ALKPHOS 73 08/01/2017 1305   BILITOT 0.5 05/31/2022 1021   BILITOT 0.25 08/01/2017 1305   GFRNONAA >60 05/31/2022 1021   GFRAA >60 03/30/2020 1343        ASSESSMENT and THERAPY PLAN:   Breast cancer 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 [name] [name] is a 51 year old woman with longstanding h/o metastatic breast cancer on maintenance Herceptin therapy.  She continues to tolerate treatment well and has no clinical signs of breast cancer progression.  She will proceed with Herceptin today.  I placed orders for repeat echocardiogram to occur between today and her next Herceptin infusion.    We discussed her peripheral neruoapthy.  I suggested she try to increase her gabapentin slightly to see if it might help her neuropathy.  I also wrote for a refill of this today.    [name] [name] will return every 3 weeks for Herceptin.  She will f/u with Dr. Burr Medico in 12 weeks.     All questions were answered. The patient knows to call the clinic with any problems, questions or concerns. We can certainly see the patient much sooner if necessary.  Total encounter time:20 minutes*in face-to-face visit time, chart review, lab review, care coordination, order entry, and documentation of the encounter time.    Wilber Bihari, NP 06/03/22 5:08 PM Medical Oncology and Hematology [facility] [place], [place] [address] Tel. [phone]    Fax. [phone]  *Total Encounter Time as defined by the Centers for Medicare and Medicaid Services includes, in addition to the face-to-face time of a patient visit (documented in the note above) non-face-to-face time: obtaining and reviewing outside history, ordering and reviewing medications, tests or procedures, care coordination (communications with  other health care professionals or caregivers) and documentation in the medical record.

## 2022-06-01 ENCOUNTER — Other Ambulatory Visit: Payer: Self-pay

## 2022-06-01 ENCOUNTER — Telehealth: Payer: Self-pay | Admitting: Adult Health

## 2022-06-01 NOTE — Telephone Encounter (Signed)
Scheduled appointment per 10/19 los. Patient is aware.

## 2022-06-02 ENCOUNTER — Other Ambulatory Visit: Payer: Self-pay

## 2022-06-03 ENCOUNTER — Other Ambulatory Visit: Payer: Self-pay

## 2022-06-03 ENCOUNTER — Encounter: Payer: Self-pay | Admitting: Hematology

## 2022-06-03 NOTE — Assessment & Plan Note (Signed)
[name] [name] is a 51 year old woman with longstanding h/o metastatic breast cancer on maintenance Herceptin therapy.  She continues to tolerate treatment well and has no clinical signs of breast cancer progression.  She will proceed with Herceptin today.  I placed orders for repeat echocardiogram to occur between today and her next Herceptin infusion.    We discussed her peripheral neruoapthy.  I suggested she try to increase her gabapentin slightly to see if it might help her neuropathy.  I also wrote for a refill of this today.    [name] [name] will return every 3 weeks for Herceptin.  She will f/u with Dr. Burr Medico in 12 weeks.

## 2022-06-04 ENCOUNTER — Ambulatory Visit (HOSPITAL_COMMUNITY)
Admission: RE | Admit: 2022-06-04 | Discharge: 2022-06-04 | Disposition: A | Discharge status: Home / Self Care | Payer: BC Managed Care – PPO | Source: Ambulatory Visit | Attending: Adult Health | Admitting: Adult Health

## 2022-06-04 DIAGNOSIS — T451X5A Adverse effect of antineoplastic and immunosuppressive drugs, initial encounter: Secondary | ICD-10-CM | POA: Diagnosis not present

## 2022-06-04 DIAGNOSIS — C50911 Malignant neoplasm of unspecified site of right female breast: Secondary | ICD-10-CM | POA: Diagnosis not present

## 2022-06-04 DIAGNOSIS — I509 Heart failure, unspecified: Secondary | ICD-10-CM | POA: Insufficient documentation

## 2022-06-04 DIAGNOSIS — C7800 Secondary malignant neoplasm of unspecified lung: Secondary | ICD-10-CM

## 2022-06-04 DIAGNOSIS — Z0189 Encounter for other specified special examinations: Secondary | ICD-10-CM

## 2022-06-04 DIAGNOSIS — G620 Drug-induced polyneuropathy: Secondary | ICD-10-CM | POA: Diagnosis not present

## 2022-06-04 DIAGNOSIS — K219 Gastro-esophageal reflux disease without esophagitis: Secondary | ICD-10-CM | POA: Insufficient documentation

## 2022-06-04 LAB — ECHOCARDIOGRAM COMPLETE
Area-P 1/2: 4.6 cm2
Calc EF: 63.6 %
S' Lateral: 2.5 cm
Single Plane A2C EF: 63.1 %
Single Plane A4C EF: 66.1 %

## 2022-06-04 NOTE — Progress Notes (Signed)
Echocardiogram 2D Echocardiogram has been performed.  [name] [name] 06/04/2022, 4:14 PM

## 2022-06-05 ENCOUNTER — Other Ambulatory Visit: Payer: Self-pay

## 2022-06-18 ENCOUNTER — Other Ambulatory Visit: Payer: Self-pay | Admitting: Hematology

## 2022-06-21 ENCOUNTER — Inpatient Hospital Stay: Payer: BC Managed Care – PPO | Attending: Hematology

## 2022-06-21 VITALS — BP 156/85 | HR 82 | Temp 99.2°F | Resp 18 | Wt 234.8 lb

## 2022-06-21 DIAGNOSIS — Z170 Estrogen receptor positive status [ER+]: Secondary | ICD-10-CM | POA: Diagnosis not present

## 2022-06-21 DIAGNOSIS — C50311 Malignant neoplasm of lower-inner quadrant of right female breast: Secondary | ICD-10-CM | POA: Diagnosis not present

## 2022-06-21 DIAGNOSIS — Z5112 Encounter for antineoplastic immunotherapy: Secondary | ICD-10-CM | POA: Diagnosis not present

## 2022-06-21 DIAGNOSIS — C50911 Malignant neoplasm of unspecified site of right female breast: Secondary | ICD-10-CM

## 2022-06-21 MED ORDER — TRASTUZUMAB-HYALURONIDASE-OYSK 600-10000 MG-UNT/5ML ~~LOC~~ SOLN
600.0000 mg | Freq: Once | SUBCUTANEOUS | Status: AC
  Administered 2022-06-21: 600 mg via SUBCUTANEOUS
  Filled 2022-06-21: qty 5

## 2022-06-21 NOTE — Patient Instructions (Signed)
[facility]  Discharge Instructions: Thank you for choosing [facility] to provide your oncology and hematology care.   If you have a lab appointment with the [facility], please go directly to the [facility] and check in at the registration area.   Wear comfortable clothing and clothing appropriate for easy access to any Portacath or PICC line.   We strive to give you quality time with your provider. You may need to reschedule your appointment if you arrive late (15 or more minutes).  Arriving late affects you and other patients whose appointments are after yours.  Also, if you miss three or more appointments without notifying the office, you may be dismissed from the clinic at the provider's discretion.      For prescription refill requests, have your pharmacy contact our office and allow 72 hours for refills to be completed.    Today you received the following chemotherapy and/or immunotherapy agents: trastuzumab-hyaluronidase      To help prevent nausea and vomiting after your treatment, we encourage you to take your nausea medication as directed.  BELOW ARE SYMPTOMS THAT SHOULD BE REPORTED IMMEDIATELY: *FEVER GREATER THAN 100.4 F (38 C) OR HIGHER *CHILLS OR SWEATING *NAUSEA AND VOMITING THAT IS NOT CONTROLLED WITH YOUR NAUSEA MEDICATION *UNUSUAL SHORTNESS OF BREATH *UNUSUAL BRUISING OR BLEEDING *URINARY PROBLEMS (pain or burning when urinating, or frequent urination) *BOWEL PROBLEMS (unusual diarrhea, constipation, pain near the anus) TENDERNESS IN MOUTH AND THROAT WITH OR WITHOUT PRESENCE OF ULCERS (sore throat, sores in mouth, or a toothache) UNUSUAL RASH, SWELLING OR PAIN  UNUSUAL VAGINAL DISCHARGE OR ITCHING   Items with * indicate a potential emergency and should be followed up as soon as possible or go to the Emergency Department if any problems should occur.  Please show the CHEMOTHERAPY ALERT CARD or IMMUNOTHERAPY ALERT CARD  at check-in to the Emergency Department and triage nurse.  Should you have questions after your visit or need to cancel or reschedule your appointment, please contact [facility]  Dept: [phone]  and follow the prompts.  Office hours are 8:00 a.m. to 4:30 p.m. Monday - Friday. Please note that voicemails left after 4:00 p.m. may not be returned until the following business day.  We are closed weekends and major holidays. You have access to a nurse at all times for urgent questions. Please call the main number to the clinic Dept: [phone] and follow the prompts.   For any non-urgent questions, you may also contact your provider using MyChart. We now offer e-Visits for anyone 34 and older to request care online for non-urgent symptoms. For details visit [email or web address].   Also download the MyChart app! Go to the app store, search "MyChart", open the app, select [facility], and log in with your MyChart username and password.  Masks are optional in the cancer centers. If you would like for your care team to wear a mask while they are taking care of you, please let them know. You may have one support person who is at least 53 years old accompany you for your appointments.

## 2022-06-26 ENCOUNTER — Other Ambulatory Visit: Payer: Self-pay | Admitting: *Deleted

## 2022-06-26 MED ORDER — SPIRONOLACTONE 25 MG PO TABS: 25.0000 mg | ORAL_TABLET | Freq: Every day | ORAL | 0 refills | Status: DC

## 2022-07-06 DIAGNOSIS — Z20822 Contact with and (suspected) exposure to covid-19: Secondary | ICD-10-CM | POA: Diagnosis not present

## 2022-07-06 DIAGNOSIS — Z1152 Encounter for screening for COVID-19: Secondary | ICD-10-CM | POA: Diagnosis not present

## 2022-07-06 DIAGNOSIS — J069 Acute upper respiratory infection, unspecified: Secondary | ICD-10-CM | POA: Diagnosis not present

## 2022-07-12 ENCOUNTER — Inpatient Hospital Stay: Payer: BC Managed Care – PPO

## 2022-07-12 VITALS — BP 128/74 | HR 89 | Temp 99.0°F | Resp 18 | Wt 234.6 lb

## 2022-07-12 DIAGNOSIS — C50311 Malignant neoplasm of lower-inner quadrant of right female breast: Secondary | ICD-10-CM | POA: Diagnosis not present

## 2022-07-12 DIAGNOSIS — C50911 Malignant neoplasm of unspecified site of right female breast: Secondary | ICD-10-CM

## 2022-07-12 DIAGNOSIS — Z5112 Encounter for antineoplastic immunotherapy: Secondary | ICD-10-CM | POA: Diagnosis not present

## 2022-07-12 DIAGNOSIS — Z95828 Presence of other vascular implants and grafts: Secondary | ICD-10-CM

## 2022-07-12 DIAGNOSIS — Z170 Estrogen receptor positive status [ER+]: Secondary | ICD-10-CM | POA: Diagnosis not present

## 2022-07-12 LAB — CBC WITH DIFFERENTIAL (CANCER CENTER ONLY)
Abs Immature Granulocytes: 0.02 10*3/uL (ref 0.00–0.07)
Basophils Absolute: 0 10*3/uL (ref 0.0–0.1)
Basophils Relative: 0 %
Eosinophils Absolute: 0.2 10*3/uL (ref 0.0–0.5)
Eosinophils Relative: 2 %
HCT: 37.1 % (ref 36.0–46.0)
Hemoglobin: 12.4 g/dL (ref 12.0–15.0)
Immature Granulocytes: 0 %
Lymphocytes Relative: 32 %
Lymphs Abs: 3.1 10*3/uL (ref 0.7–4.0)
MCH: 30 pg (ref 26.0–34.0)
MCHC: 33.4 g/dL (ref 30.0–36.0)
MCV: 89.8 fL (ref 80.0–100.0)
Monocytes Absolute: 0.7 10*3/uL (ref 0.1–1.0)
Monocytes Relative: 8 %
Neutro Abs: 5.5 10*3/uL (ref 1.7–7.7)
Neutrophils Relative %: 58 %
Platelet Count: 308 10*3/uL (ref 150–400)
RBC: 4.13 MIL/uL (ref 3.87–5.11)
RDW: 12.8 % (ref 11.5–15.5)
WBC Count: 9.6 10*3/uL (ref 4.0–10.5)
nRBC: 0 % (ref 0.0–0.2)

## 2022-07-12 LAB — CMP (CANCER CENTER ONLY)
ALT: 22 U/L (ref 0–44)
AST: 11 U/L — ABNORMAL LOW (ref 15–41)
Albumin: 4.3 g/dL (ref 3.5–5.0)
Alkaline Phosphatase: 80 U/L (ref 38–126)
Anion gap: 9 (ref 5–15)
BUN: 16 mg/dL (ref 6–20)
CO2: 28 mmol/L (ref 22–32)
Calcium: 9.8 mg/dL (ref 8.9–10.3)
Chloride: 100 mmol/L (ref 98–111)
Creatinine: 0.9 mg/dL (ref 0.44–1.00)
GFR, Estimated: >60 mL/min (ref >60)
Glucose, Bld: 92 mg/dL (ref 70–99)
Potassium: 3.7 mmol/L (ref 3.5–5.1)
Sodium: 137 mmol/L (ref 135–145)
Total Bilirubin: 0.4 mg/dL (ref 0.3–1.2)
Total Protein: 8.7 g/dL — ABNORMAL HIGH (ref 6.5–8.1)

## 2022-07-12 MED ORDER — HEPARIN SOD (PORK) LOCK FLUSH 100 UNIT/ML IV SOLN
500.0000 [IU] | Freq: Once | INTRAVENOUS | Status: AC | PRN
  Administered 2022-07-12: 500 [IU] via INTRAVENOUS

## 2022-07-12 MED ORDER — SODIUM CHLORIDE 0.9% FLUSH
10.0000 mL | Freq: Once | INTRAVENOUS | Status: AC
  Administered 2022-07-12: 10 mL

## 2022-07-12 MED ORDER — TRASTUZUMAB-HYALURONIDASE-OYSK 600-10000 MG-UNT/5ML ~~LOC~~ SOLN
600.0000 mg | Freq: Once | SUBCUTANEOUS | Status: AC
  Administered 2022-07-12: 600 mg via SUBCUTANEOUS
  Filled 2022-07-12: qty 5

## 2022-07-12 NOTE — Patient Instructions (Signed)

## 2022-07-30 DIAGNOSIS — Z1231 Encounter for screening mammogram for malignant neoplasm of breast: Secondary | ICD-10-CM | POA: Diagnosis not present

## 2022-08-02 ENCOUNTER — Inpatient Hospital Stay: Payer: BC Managed Care – PPO | Attending: Hematology

## 2022-08-02 VITALS — BP 148/87 | HR 89 | Temp 99.2°F | Resp 18

## 2022-08-02 DIAGNOSIS — Z5112 Encounter for antineoplastic immunotherapy: Secondary | ICD-10-CM | POA: Insufficient documentation

## 2022-08-02 DIAGNOSIS — C50311 Malignant neoplasm of lower-inner quadrant of right female breast: Secondary | ICD-10-CM | POA: Diagnosis not present

## 2022-08-02 DIAGNOSIS — C50911 Malignant neoplasm of unspecified site of right female breast: Secondary | ICD-10-CM

## 2022-08-02 MED ORDER — TRASTUZUMAB-HYALURONIDASE-OYSK 600-10000 MG-UNT/5ML ~~LOC~~ SOLN
600.0000 mg | Freq: Once | SUBCUTANEOUS | Status: AC
  Administered 2022-08-02: 600 mg via SUBCUTANEOUS
  Filled 2022-08-02: qty 5

## 2022-08-02 NOTE — Patient Instructions (Signed)
[facility]  Discharge Instructions: Thank you for choosing [facility] to provide your oncology and hematology care.   If you have a lab appointment with the [facility], please go directly to the [facility] and check in at the registration area.   Wear comfortable clothing and clothing appropriate for easy access to any Portacath or PICC line.   We strive to give you quality time with your provider. You may need to reschedule your appointment if you arrive late (15 or more minutes).  Arriving late affects you and other patients whose appointments are after yours.  Also, if you miss three or more appointments without notifying the office, you may be dismissed from the clinic at the provider's discretion.      For prescription refill requests, have your pharmacy contact our office and allow 72 hours for refills to be completed.    Today you received the following chemotherapy and/or immunotherapy agents herceptin hylecta      To help prevent nausea and vomiting after your treatment, we encourage you to take your nausea medication as directed.  BELOW ARE SYMPTOMS THAT SHOULD BE REPORTED IMMEDIATELY: *FEVER GREATER THAN 100.4 F (38 C) OR HIGHER *CHILLS OR SWEATING *NAUSEA AND VOMITING THAT IS NOT CONTROLLED WITH YOUR NAUSEA MEDICATION *UNUSUAL SHORTNESS OF BREATH *UNUSUAL BRUISING OR BLEEDING *URINARY PROBLEMS (pain or burning when urinating, or frequent urination) *BOWEL PROBLEMS (unusual diarrhea, constipation, pain near the anus) TENDERNESS IN MOUTH AND THROAT WITH OR WITHOUT PRESENCE OF ULCERS (sore throat, sores in mouth, or a toothache) UNUSUAL RASH, SWELLING OR PAIN  UNUSUAL VAGINAL DISCHARGE OR ITCHING   Items with * indicate a potential emergency and should be followed up as soon as possible or go to the Emergency Department if any problems should occur.  Please show the CHEMOTHERAPY ALERT CARD or IMMUNOTHERAPY ALERT CARD at  check-in to the Emergency Department and triage nurse.  Should you have questions after your visit or need to cancel or reschedule your appointment, please contact [facility]  Dept: [phone]  and follow the prompts.  Office hours are 8:00 a.m. to 4:30 p.m. Monday - Friday. Please note that voicemails left after 4:00 p.m. may not be returned until the following business day.  We are closed weekends and major holidays. You have access to a nurse at all times for urgent questions. Please call the main number to the clinic Dept: [phone] and follow the prompts.   For any non-urgent questions, you may also contact your provider using MyChart. We now offer e-Visits for anyone 8 and older to request care online for non-urgent symptoms. For details visit [email or web address].   Also download the MyChart app! Go to the app store, search "MyChart", open the app, select [facility], and log in with your MyChart username and password.  Masks are optional in the cancer centers. If you would like for your care team to wear a mask while they are taking care of you, please let them know. You may have one support person who is at least 53 years old accompany you for your appointments.

## 2022-08-16 ENCOUNTER — Other Ambulatory Visit (HOSPITAL_COMMUNITY): Payer: Self-pay | Admitting: Internal Medicine

## 2022-08-20 ENCOUNTER — Encounter: Payer: Self-pay | Admitting: Hematology

## 2022-08-23 ENCOUNTER — Inpatient Hospital Stay: Payer: BC Managed Care – PPO | Attending: Hematology | Admitting: Hematology

## 2022-08-23 ENCOUNTER — Telehealth: Payer: Self-pay | Admitting: Hematology

## 2022-08-23 ENCOUNTER — Inpatient Hospital Stay: Payer: BC Managed Care – PPO

## 2022-08-23 VITALS — BP 111/70 | HR 93 | Temp 98.2°F | Resp 18 | Ht 70.0 in | Wt 232.1 lb

## 2022-08-23 DIAGNOSIS — Z5112 Encounter for antineoplastic immunotherapy: Secondary | ICD-10-CM | POA: Diagnosis not present

## 2022-08-23 DIAGNOSIS — Z95828 Presence of other vascular implants and grafts: Secondary | ICD-10-CM

## 2022-08-23 DIAGNOSIS — C7800 Secondary malignant neoplasm of unspecified lung: Secondary | ICD-10-CM | POA: Diagnosis not present

## 2022-08-23 DIAGNOSIS — I10 Essential (primary) hypertension: Secondary | ICD-10-CM | POA: Insufficient documentation

## 2022-08-23 DIAGNOSIS — Z171 Estrogen receptor negative status [ER-]: Secondary | ICD-10-CM | POA: Diagnosis not present

## 2022-08-23 DIAGNOSIS — C50311 Malignant neoplasm of lower-inner quadrant of right female breast: Secondary | ICD-10-CM | POA: Insufficient documentation

## 2022-08-23 DIAGNOSIS — C50911 Malignant neoplasm of unspecified site of right female breast: Secondary | ICD-10-CM | POA: Diagnosis not present

## 2022-08-23 LAB — CBC WITH DIFFERENTIAL (CANCER CENTER ONLY)
Abs Immature Granulocytes: 0.02 10*3/uL (ref 0.00–0.07)
Basophils Absolute: 0 10*3/uL (ref 0.0–0.1)
Basophils Relative: 0 %
Eosinophils Absolute: 0.1 10*3/uL (ref 0.0–0.5)
Eosinophils Relative: 1 %
HCT: 34.6 % — ABNORMAL LOW (ref 36.0–46.0)
Hemoglobin: 11.8 g/dL — ABNORMAL LOW (ref 12.0–15.0)
Immature Granulocytes: 0 %
Lymphocytes Relative: 25 %
Lymphs Abs: 2 10*3/uL (ref 0.7–4.0)
MCH: 30.6 pg (ref 26.0–34.0)
MCHC: 34.1 g/dL (ref 30.0–36.0)
MCV: 89.6 fL (ref 80.0–100.0)
Monocytes Absolute: 0.5 10*3/uL (ref 0.1–1.0)
Monocytes Relative: 6 %
Neutro Abs: 5.3 10*3/uL (ref 1.7–7.7)
Neutrophils Relative %: 68 %
Platelet Count: 236 10*3/uL (ref 150–400)
RBC: 3.86 MIL/uL — ABNORMAL LOW (ref 3.87–5.11)
RDW: 13.4 % (ref 11.5–15.5)
WBC Count: 7.9 10*3/uL (ref 4.0–10.5)
nRBC: 0 % (ref 0.0–0.2)

## 2022-08-23 LAB — CMP (CANCER CENTER ONLY)
ALT: 14 U/L (ref 0–44)
AST: 7 U/L — ABNORMAL LOW (ref 15–41)
Albumin: 3.9 g/dL (ref 3.5–5.0)
Alkaline Phosphatase: 58 U/L (ref 38–126)
Anion gap: 4 — ABNORMAL LOW (ref 5–15)
BUN: 10 mg/dL (ref 6–20)
CO2: 31 mmol/L (ref 22–32)
Calcium: 9.4 mg/dL (ref 8.9–10.3)
Chloride: 103 mmol/L (ref 98–111)
Creatinine: 0.74 mg/dL (ref 0.44–1.00)
GFR, Estimated: >60 mL/min (ref >60)
Glucose, Bld: 127 mg/dL — ABNORMAL HIGH (ref 70–99)
Potassium: 3.9 mmol/L (ref 3.5–5.1)
Sodium: 138 mmol/L (ref 135–145)
Total Bilirubin: 0.4 mg/dL (ref 0.3–1.2)
Total Protein: 7.6 g/dL (ref 6.5–8.1)

## 2022-08-23 MED ORDER — SODIUM CHLORIDE 0.9% FLUSH
10.0000 mL | Freq: Once | INTRAVENOUS | Status: AC
  Administered 2022-08-23: 10 mL

## 2022-08-23 MED ORDER — HEPARIN SOD (PORK) LOCK FLUSH 100 UNIT/ML IV SOLN
500.0000 [IU] | Freq: Once | INTRAVENOUS | Status: AC | PRN
  Administered 2022-08-23: 500 [IU] via INTRAVENOUS

## 2022-08-23 MED ORDER — TRASTUZUMAB-HYALURONIDASE-OYSK 600-10000 MG-UNT/5ML ~~LOC~~ SOLN
600.0000 mg | Freq: Once | SUBCUTANEOUS | Status: AC
  Administered 2022-08-23: 600 mg via SUBCUTANEOUS
  Filled 2022-08-23: qty 5

## 2022-08-23 NOTE — Telephone Encounter (Signed)
Attempted to bring patient calendar while in infusion. Called patient who confirmed still in building. Gave patient printed copy of new appointments.

## 2022-08-23 NOTE — Progress Notes (Signed)
[facility]   Telephone:(336) [phone] Fax:(336) [phone]   Clinic Follow up Note   Patient Care Team: Virginia Rochester, [place] as PCP - General (Family Medicine) [name], DO (Family Medicine) Stark Klein, MD as Consulting Physician (General Surgery) [name], MD as Consulting Physician (Hematology) Clydie Braun, MD (Obstetrics and Gynecology) Haroldine Laws, Shaune Pascal, MD as Consulting Physician (Cardiology)  Date of Service:  08/23/2022  CHIEF COMPLAINT: f/u of metastatic breast cancer   CURRENT THERAPY: maintenance Herceptin Hylecta injection every 3 weeks    ASSESSMENT:  [name] [name] is a 52 y.o. female with   1. 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 Her restaging scan has showed NED -last restaging CT CAP 01/15/22 showed NED. Plan to repeat yearly  -She has been on maintenance Herceptin for about 7.5 years.  She is probably cured, although I am not able to approve it. Will continue Herceptin for now, I would consider stopping therapy after 10 years -she continues to tolerate herceptin injections well. Labs reviewed, overall stable. -F/u in 4 months    2. Bone Health -she has never had DEXA scan before. Will do it with her mammogram in late 2024    3. HTN -on meds  -f/up Dr. Haroldine Laws   4. Genetic testing -Was retested by genetic counseling for hereditary disposition for cancer in December 2022.  She was negative.       PLAN: -lab reviewed, will proceed Herceptin injection and continue every 3 weeks  -lab and flush every 9 weeks  -Discuss Bone density Scan to be done with her next MM - Echo due in April  -next CT scan in June 2024 -f/u in 4 months  SUMMARY OF ONCOLOGIC HISTORY: Oncology History Overview Note   Breast cancer 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   Staging form: Breast, AJCC 7th Edition     Clinical stage from 07/22/2014: Stage IV (T3, N1, M1) - Unsigned     Breast cancer 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 ([facility])  07/02/2014 Mammogram   Mammogram showed a 2cm right beast mass and a 1.8cm right axillary node. MRI breast on 07/16/2014 showed 7cm R breast lesion and 4.4cm r axillary node    07/02/2014 Imaging   CT CAP: a 4.7cm mass in LUL lung and a 2.1cm mas in RML, and a small nodule in RUL, suspecious for metastasis     07/09/2014 Initial Diagnosis   right IDA with b/l lung lesions, ER-/PR-/HER2+   07/09/2014 Initial Biopsy   US guided right breast mass and axillary node biopsy showed IDA, and DCIS, ER-/PR-/HER2+   07/26/2014 Pathologic Stage   Left lung mass by IR, path revealed high grade carcinoma, morphology similar to breast tumor biopsy, TTF(-), NapsinA(-), ER(-)   08/04/2014 - 03/22/2015 Chemotherapy   weekly Paclitaxel 80mg /m2, trastuzumab and pertuzumab every 3 weeks   08/30/2014 Genetic Testing   BreastNext panel was negative. 17 genes including BRCA1, BRCA2, were negative for mutations.    10/04/2014 Imaging   Interval decrease in the right axillary lymphadenopathy. Bilateral pulmonary lesions with left hilar lymphadenopathy also markedly decreased in the interval. The left hilar lymphadenopathy has resolved.   12/20/2014 Imaging   restaging CT showed stable disease, no new lesions    03/29/2015 Pathology Results    right breast lumpectomy showed  chemotherapy treatment effect,  a 1 mm residual tumor,  margins were widely negative, 5 sentinel lymph nodes and 2 axillary lymph nodes were negative.    03/29/2015 Surgery    right breast lumpectomy and sentinel lymph node biopsy  by Dr. Barry Dienes   04/12/2015 -  Chemotherapy   Herceptin maintenance therapy , 6 mg/kg, every 3 weeks since 04/12/15. Switched to Herceptin Hylecta injection q3weeks after 06/10/19.   04/12/2015 -  Chemotherapy   Patient is  on Treatment Plan : BREAST Trastuzumab q21d     05/03/2015 - 06/14/2015 Radiation Therapy   right breast adjuvant irradiation by Dr. Valere Dross    08/28/2016 Imaging   CT CAP w Contrast  IMPRESSION: 1. No acute process or evidence of metastatic disease within the chest, abdomen, or pelvis. 2. Similar to less well-defined left upper lobe density, likely scarring. No evidence of new or progressive pulmonary metastasis. 3. Right nephrolithiasis.   01/22/2017 Imaging   CT Chest W Contrast 01/22/17 IMPRESSION: 1. Stable exam. No new or progressive findings. No evidence for metastatic disease. 2. Left upper lobe architectural distortion/scarring is stable. 3. Nonobstructing right renal stone.   07/31/2017 Imaging   Bone Scan Whole Body 07/31/17  IMPRESSION: 1. No scintigraphic evidence of osseous metastatic disease. 2. Thoracolumbar scoliosis.    07/31/2017 Imaging   CT CAP W Contrast 07/31/17 IMPRESSION: 1. No findings of active or recurrent malignancy. 2. Other imaging findings of potential clinical significance: Aortic Atherosclerosis (ICD10-I70.0). Mild cardiomegaly. Postoperative and radiation therapy findings in the right chest. Nonobstructive right nephrolithiasis. Thoracolumbar scoliosis.     01/20/2018 Echocardiogram   ECHO 01/20/2018 Study Conclusions   - Left ventricle: The cavity size was normal. Wall thickness was   normal. Systolic function was normal. The estimated ejection   fraction was in the range of 55% to 60%. Wall motion was normal;   there were no regional wall motion abnormalities. Doppler   parameters are consistent with abnormal left ventricular   relaxation (grade 1 diastolic dysfunction). - Impressions: GLS -20.7% LS&' 10.4 cm.    02/17/2018 Imaging   02/17/2018 CT CAP IMPRESSION: 1. No evidence for residual or recurrent tumor or metastatic disease. 2.  Aortic Atherosclerosis (ICD10-I70.0). 3. Right renal calculi. 4. Scoliosis.   08/11/2018 Imaging    CT CAP W Contrast 08/11/18  IMPRESSION: No evidence for localized recurrence or metastatic disease in the chest, abdomen or pelvis.   02/23/2019 Imaging   CT CAP W Contrast  IMPRESSION: 1. No findings of active malignancy in the chest, abdomen, or pelvis. 2. 3 mm right kidney lower pole nonobstructive renal calculus. 3. Thoracolumbar scoliosis.   12/08/2019 Imaging   CT CAP W contrast  IMPRESSION: 1. Unchanged post treatment appearance of the lungs. No evidence of recurrent or new metastatic disease in the chest, abdomen, or pelvis.   2. Postoperative findings of right lumpectomy and axillary lymph node dissection.   3.  Nonobstructive right nephrolithiasis.   11/17/2020 Imaging   CT CAP IMPRESSION: 1. No evidence of recurrence or new metastatic disease within the chest, abdomen, or pelvis. 2. Similar post treatment changes in the lungs and postoperative findings of right lumpectomy and axillary lymph node dissection. 3. Colonic diverticulosis without findings of acute diverticulitis.   08/10/2021 Genetic Testing   Negative genetic testing on the CancerNext-Expanded+RNAinsight panel.  The report date is August 10, 2021.  The CancerNext-Expanded gene panel offered by [facility] and includes sequencing and rearrangement analysis for the following 77 genes: AIP, ALK, APC*, ATM*, AXIN2, BAP1, BARD1, BLM, BMPR1A, BRCA1*, BRCA2*, BRIP1*, CDC73, CDH1*,  CDK4, CDKN1B, CDKN2A, CHEK2*, CTNNA1, DICER1, FANCC, FH, FLCN, GALNT12, KIF1B, LZTR1, MAX, MEN1, MET, MLH1*, MSH2*, MSH3, MSH6*, MUTYH*, NBN, NF1*, NF2, NTHL1, PALB2*, PHOX2B, PMS2*, POT1, PRKAR1A, PTCH1, PTEN*, RAD51C*, RAD51D*, RB1, RECQL, RET, SDHA, SDHAF2, SDHB, SDHC, SDHD, SMAD4, SMARCA4, SMARCB1, SMARCE1, STK11, SUFU, TMEM127, TP53*, TSC1, TSC2, VHL and XRCC2 (sequencing and deletion/duplication); EGFR, EGLN1, HOXB13, KIT, MITF, PDGFRA, POLD1, and POLE (sequencing only); EPCAM and GREM1 (deletion/duplication only). DNA and RNA  analyses performed for * genes.       INTERVAL HISTORY:  [name] [name] is here for a follow up of metastatic breast cancer  She was last seen by NP Mendel Ryder on 05/31/2022 She presents to the clinic alone.Pt reports of doing good. She report of having congestion, and sinus issues. She report she was on an antibiotic. Pt denies having a fever now.      All other systems were reviewed with the patient and are negative.  MEDICAL HISTORY:  Past Medical History:  Diagnosis Date   Breast cancer ([facility])    Breast cancer ([facility])    CHF (congestive heart failure) ([facility])    Family history of breast cancer    Family history of prostate cancer    GERD (gastroesophageal reflux disease)    during pregnancy    Pneumonia    hx of pneumonia 08/2013    S/P radiation therapy 05/03/2015 through 06/14/2015                                                      Right breast 4680 cGy in 26 sessions, right breast boost 1000 cGy in 5 sessions.  Right supraclavicular/axillary region 4680 cGy with a supplemental PA field to bring the axillary dose up to 4500 cGy in 26 sessions    SURGICAL HISTORY: Past Surgical History:  Procedure Laterality Date   BREAST LUMPECTOMY WITH RADIOACTIVE SEED AND SENTINEL LYMPH NODE BIOPSY Right 03/29/2015   Procedure: BREAST LUMPECTOMY WITH RADIOACTIVE SEED AND SENTINEL LYMPH NODE BIOPSY;  Surgeon: Stark Klein, MD;  Location: [facility];  Service: General;  Laterality: Right;   CESAREAN SECTION     CHOLECYSTECTOMY     ESSURE TUBAL LIGATION     PORTACATH PLACEMENT Left 07/21/2014   Procedure: INSERTION PORT-A-CATH;  Surgeon: Stark Klein, MD;  Location: [facility];  Service: General;  Laterality: Left;   WISDOM TOOTH EXTRACTION      I have reviewed the social history and family history with the patient and they are unchanged from previous note.  ALLERGIES:  is allergic to adhesive [tape], aspirin, and codeine.  MEDICATIONS:  Current Outpatient Medications   Medication Sig Dispense Refill   acetaminophen (TYLENOL) 500 MG tablet Take 500 mg by mouth every 6 (six) hours as needed.     Ascorbic Acid (VITAMIN C) 500 MG CHEW      Calcium-Phosphorus-Vitamin D (CALCIUM GUMMIES PO) Take by mouth daily.     Camphor-Eucalyptus-Menthol (VICKS VAPORUB EX) Apply 1 application. topically at bedtime. Applies under nose, throat and on chest as needed     cetirizine (ZYRTEC) 10 MG tablet Take 10 mg by mouth daily as needed for allergies (allergies).      Fluocinolone Acetonide Body 0.01 % OIL Apply topically 4 (four) times a week.     fluticasone (FLONASE) 50 MCG/ACT nasal spray Place into both nostrils daily.  gabapentin (NEURONTIN) 100 MG capsule TAKE 2 CAPSULES(200 MG) BY MOUTH AT BEDTIME 60 capsule 3   hydrocortisone 2.5 % cream Apply 1 application topically 2 (two) times daily as needed.     ibuprofen (ADVIL,MOTRIN) 800 MG tablet Take 1 tablet (800 mg total) by mouth every 8 (eight) hours as needed. 60 tablet 1   lidocaine-prilocaine (EMLA) cream Apply as directed prior to chemotherapy 30 g 2   loperamide (IMODIUM A-D) 2 MG tablet Take 1 tablet (2 mg total) by mouth 4 (four) times daily as needed for diarrhea or loose stools. 30 tablet 0   losartan (COZAAR) 25 MG tablet TAKE 1 TABLET(25 MG) BY MOUTH DAILY 90 tablet 3   minoxidil (LONITEN) 2.5 MG tablet Take 2.5 mg by mouth daily. 1/2 tab once daily     Multiple Vitamins-Minerals (MULTIVITAMIN GUMMIES ADULT PO) Take 1 each by mouth daily. Women's Vitafusion gummie     spironolactone (ALDACTONE) 25 MG tablet TAKE 1 TABLET(25 MG) BY MOUTH DAILY 90 tablet 0   No current facility-administered medications for this visit.   Facility-Administered Medications Ordered in Other Visits  Medication Dose Route Frequency Provider Last Rate Last Admin   acetaminophen (TYLENOL) tablet 650 mg  650 mg Oral Once [name], MD       heparin lock flush 100 unit/mL  500 Units Intracatheter Once PRN [name], MD       sodium  chloride 0.9 % injection 10 mL  10 mL Intracatheter PRN [name], MD   10 mL at 05/01/16 1622   sodium chloride 0.9 % injection 10 mL  10 mL Intravenous PRN [name], MD       sodium chloride 0.9 % injection 10 mL  10 mL Intracatheter PRN [name], MD   10 mL at 03/18/19 1715   sodium chloride flush (NS) 0.9 % injection 10 mL  10 mL Intravenous PRN [name], MD        PHYSICAL EXAMINATION: ECOG PERFORMANCE STATUS: 0 - Asymptomatic  Vitals:   08/23/22 1419  BP: 111/70  Pulse: 93  Resp: 18  Temp: 98.2 F (36.8 C)  SpO2: 97%   Wt Readings from Last 3 Encounters:  08/23/22 232 lb 1.6 oz (105.3 kg)  07/12/22 234 lb 9 oz (106.4 kg)  06/21/22 234 lb 12 oz (106.5 kg)    GENERAL:alert, no distress and comfortable SKIN: skin color, texture, turgor are normal, no rashes or significant lesions EYES: normal, Conjunctiva are pink and non-injected, sclera clear NECK: (-) supple, thyroid normal size, non-tender, without nodularity LYMPH:(-)   no palpable lymphadenopathy in the cervical, axillary  LUNGS:(-) clear to auscultation and little  percussion with normal breathing effort HEART: regular rate & rhythm and no murmurs and no lower extremity edema ABDOMEN:abdomen soft, non-tender and normal bowel sounds Musculoskeletal:no cyanosis of digits and no clubbing  NEURO: alert & oriented x 3 with fluent speech, no focal motor/sensory deficits BREAST: No palpable mass, breast exam benign     LABORATORY DATA:  I have reviewed the data as listed    Latest Ref Rng & Units 08/23/2022    2:04 PM 07/12/2022    2:47 PM 05/31/2022   10:21 AM  CBC  WBC 4.0 - 10.5 K/uL 7.9  9.6  6.4   Hemoglobin 12.0 - 15.0 g/dL 11.8  12.4  12.5   Hematocrit 36.0 - 46.0 % 34.6  37.1  36.6   Platelets 150 - 400 K/uL 236  308  254  Latest Ref Rng & Units 08/23/2022    2:04 PM 07/12/2022    2:47 PM 05/31/2022   10:21 AM  CMP  Glucose 70 - 99 mg/dL 127  92  119   BUN 6 - 20 mg/dL 10  16  16     Creatinine 0.44 - 1.00 mg/dL 0.74  0.90  0.83   Sodium 135 - 145 mmol/L 138  137  139   Potassium 3.5 - 5.1 mmol/L 3.9  3.7  3.7   Chloride 98 - 111 mmol/L 103  100  105   CO2 22 - 32 mmol/L 31  28  27    Calcium 8.9 - 10.3 mg/dL 9.4  9.8  9.1   Total Protein 6.5 - 8.1 g/dL 7.6  8.7  7.9   Total Bilirubin 0.3 - 1.2 mg/dL 0.4  0.4  0.5   Alkaline Phos 38 - 126 U/L 58  80  68   AST 15 - 41 U/L 7  11  15    ALT 0 - 44 U/L 14  22  25        RADIOGRAPHIC STUDIES: I have personally reviewed the radiological images as listed and agreed with the findings in the report. No results found.    No orders of the defined types were placed in this encounter.  All questions were answered. The patient knows to call the clinic with any problems, questions or concerns. No barriers to learning was detected. The total time spent in the appointment was 30 minutes.     [name], MD 08/23/2022   Felicity Coyer, CMA, am acting as scribe for [name], MD.   I have reviewed the above documentation for accuracy and completeness, and I agree with the above.

## 2022-08-24 ENCOUNTER — Other Ambulatory Visit: Payer: Self-pay

## 2022-08-25 ENCOUNTER — Other Ambulatory Visit: Payer: Self-pay

## 2022-09-05 ENCOUNTER — Other Ambulatory Visit: Payer: Self-pay

## 2022-09-10 ENCOUNTER — Telehealth: Payer: Self-pay | Admitting: Hematology

## 2022-09-10 NOTE — Telephone Encounter (Signed)
Patient aware of appointment change

## 2022-09-12 ENCOUNTER — Inpatient Hospital Stay: Payer: BC Managed Care – PPO

## 2022-09-12 VITALS — BP 142/72 | HR 72 | Temp 98.8°F | Resp 16 | Wt 235.5 lb

## 2022-09-12 DIAGNOSIS — C50911 Malignant neoplasm of unspecified site of right female breast: Secondary | ICD-10-CM

## 2022-09-12 DIAGNOSIS — C7800 Secondary malignant neoplasm of unspecified lung: Secondary | ICD-10-CM | POA: Diagnosis not present

## 2022-09-12 DIAGNOSIS — I10 Essential (primary) hypertension: Secondary | ICD-10-CM | POA: Diagnosis not present

## 2022-09-12 DIAGNOSIS — C50311 Malignant neoplasm of lower-inner quadrant of right female breast: Secondary | ICD-10-CM | POA: Diagnosis not present

## 2022-09-12 DIAGNOSIS — Z5112 Encounter for antineoplastic immunotherapy: Secondary | ICD-10-CM | POA: Diagnosis not present

## 2022-09-12 DIAGNOSIS — Z171 Estrogen receptor negative status [ER-]: Secondary | ICD-10-CM | POA: Diagnosis not present

## 2022-09-12 MED ORDER — TRASTUZUMAB-HYALURONIDASE-OYSK 600-10000 MG-UNT/5ML ~~LOC~~ SOLN
600.0000 mg | Freq: Once | SUBCUTANEOUS | Status: AC
  Administered 2022-09-12: 600 mg via SUBCUTANEOUS
  Filled 2022-09-12: qty 5

## 2022-09-12 NOTE — Patient Instructions (Signed)
[facility]  Discharge Instructions: Thank you for choosing [facility] to provide your oncology and hematology care.   If you have a lab appointment with the [facility], please go directly to the [facility] and check in at the registration area.   Wear comfortable clothing and clothing appropriate for easy access to any Portacath or PICC line.   We strive to give you quality time with your provider. You may need to reschedule your appointment if you arrive late (15 or more minutes).  Arriving late affects you and other patients whose appointments are after yours.  Also, if you miss three or more appointments without notifying the office, you may be dismissed from the clinic at the provider's discretion.      For prescription refill requests, have your pharmacy contact our office and allow 72 hours for refills to be completed.    Today you received the following chemotherapy and/or immunotherapy agents herceptin hylecta      To help prevent nausea and vomiting after your treatment, we encourage you to take your nausea medication as directed.  BELOW ARE SYMPTOMS THAT SHOULD BE REPORTED IMMEDIATELY: *FEVER GREATER THAN 100.4 F (38 C) OR HIGHER *CHILLS OR SWEATING *NAUSEA AND VOMITING THAT IS NOT CONTROLLED WITH YOUR NAUSEA MEDICATION *UNUSUAL SHORTNESS OF BREATH *UNUSUAL BRUISING OR BLEEDING *URINARY PROBLEMS (pain or burning when urinating, or frequent urination) *BOWEL PROBLEMS (unusual diarrhea, constipation, pain near the anus) TENDERNESS IN MOUTH AND THROAT WITH OR WITHOUT PRESENCE OF ULCERS (sore throat, sores in mouth, or a toothache) UNUSUAL RASH, SWELLING OR PAIN  UNUSUAL VAGINAL DISCHARGE OR ITCHING   Items with * indicate a potential emergency and should be followed up as soon as possible or go to the Emergency Department if any problems should occur.  Please show the CHEMOTHERAPY ALERT CARD or IMMUNOTHERAPY ALERT CARD  at check-in to the Emergency Department and triage nurse.  Should you have questions after your visit or need to cancel or reschedule your appointment, please contact [facility]  Dept: [phone]  and follow the prompts.  Office hours are 8:00 a.m. to 4:30 p.m. Monday - Friday. Please note that voicemails left after 4:00 p.m. may not be returned until the following business day.  We are closed weekends and major holidays. You have access to a nurse at all times for urgent questions. Please call the main number to the clinic Dept: [phone] and follow the prompts.   For any non-urgent questions, you may also contact your provider using MyChart. We now offer e-Visits for anyone 63 and older to request care online for non-urgent symptoms. For details visit [email or web address].   Also download the MyChart app! Go to the app store, search "MyChart", open the app, select [facility], and log in with your MyChart username and password.  Masks are optional in the cancer centers. If you would like for your care team to wear a mask while they are taking care of you, please let them know. You may have one support person who is at least 54 years old accompany you for your appointments.

## 2022-09-13 ENCOUNTER — Inpatient Hospital Stay: Payer: BC Managed Care – PPO

## 2022-10-04 ENCOUNTER — Inpatient Hospital Stay: Payer: BC Managed Care – PPO | Attending: Hematology

## 2022-10-04 VITALS — BP 140/83 | HR 93 | Resp 17 | Wt 237.0 lb

## 2022-10-04 DIAGNOSIS — C50911 Malignant neoplasm of unspecified site of right female breast: Secondary | ICD-10-CM

## 2022-10-04 DIAGNOSIS — Z5112 Encounter for antineoplastic immunotherapy: Secondary | ICD-10-CM | POA: Diagnosis not present

## 2022-10-04 DIAGNOSIS — C50311 Malignant neoplasm of lower-inner quadrant of right female breast: Secondary | ICD-10-CM | POA: Diagnosis not present

## 2022-10-04 MED ORDER — TRASTUZUMAB-HYALURONIDASE-OYSK 600-10000 MG-UNT/5ML ~~LOC~~ SOLN
600.0000 mg | Freq: Once | SUBCUTANEOUS | Status: AC
  Administered 2022-10-04: 600 mg via SUBCUTANEOUS
  Filled 2022-10-04: qty 5

## 2022-10-17 ENCOUNTER — Other Ambulatory Visit: Payer: Self-pay

## 2022-10-17 ENCOUNTER — Other Ambulatory Visit: Payer: Self-pay | Admitting: Hematology and Oncology

## 2022-10-17 ENCOUNTER — Other Ambulatory Visit: Payer: Self-pay | Admitting: Hematology

## 2022-10-17 ENCOUNTER — Encounter (HOSPITAL_BASED_OUTPATIENT_CLINIC_OR_DEPARTMENT_OTHER): Payer: Self-pay | Admitting: Pediatrics

## 2022-10-17 ENCOUNTER — Emergency Department (HOSPITAL_BASED_OUTPATIENT_CLINIC_OR_DEPARTMENT_OTHER)
Admission: EM | Admit: 2022-10-17 | Discharge: 2022-10-17 | Disposition: A | Discharge status: Home / Self Care | Payer: BC Managed Care – PPO | Attending: Emergency Medicine | Admitting: Emergency Medicine

## 2022-10-17 ENCOUNTER — Emergency Department (HOSPITAL_BASED_OUTPATIENT_CLINIC_OR_DEPARTMENT_OTHER): Payer: BC Managed Care – PPO

## 2022-10-17 DIAGNOSIS — M791 Myalgia, unspecified site: Secondary | ICD-10-CM | POA: Insufficient documentation

## 2022-10-17 DIAGNOSIS — I509 Heart failure, unspecified: Secondary | ICD-10-CM | POA: Insufficient documentation

## 2022-10-17 DIAGNOSIS — K5730 Diverticulosis of large intestine without perforation or abscess without bleeding: Secondary | ICD-10-CM | POA: Diagnosis not present

## 2022-10-17 DIAGNOSIS — R197 Diarrhea, unspecified: Secondary | ICD-10-CM | POA: Diagnosis not present

## 2022-10-17 DIAGNOSIS — N200 Calculus of kidney: Secondary | ICD-10-CM | POA: Diagnosis not present

## 2022-10-17 DIAGNOSIS — Z79899 Other long term (current) drug therapy: Secondary | ICD-10-CM | POA: Diagnosis not present

## 2022-10-17 DIAGNOSIS — R531 Weakness: Secondary | ICD-10-CM | POA: Insufficient documentation

## 2022-10-17 DIAGNOSIS — N3289 Other specified disorders of bladder: Secondary | ICD-10-CM | POA: Diagnosis not present

## 2022-10-17 DIAGNOSIS — R079 Chest pain, unspecified: Secondary | ICD-10-CM | POA: Insufficient documentation

## 2022-10-17 DIAGNOSIS — R0602 Shortness of breath: Secondary | ICD-10-CM | POA: Diagnosis not present

## 2022-10-17 DIAGNOSIS — R051 Acute cough: Secondary | ICD-10-CM | POA: Insufficient documentation

## 2022-10-17 DIAGNOSIS — Z1152 Encounter for screening for COVID-19: Secondary | ICD-10-CM | POA: Diagnosis not present

## 2022-10-17 DIAGNOSIS — Z85118 Personal history of other malignant neoplasm of bronchus and lung: Secondary | ICD-10-CM | POA: Insufficient documentation

## 2022-10-17 DIAGNOSIS — R059 Cough, unspecified: Secondary | ICD-10-CM | POA: Diagnosis not present

## 2022-10-17 DIAGNOSIS — Z853 Personal history of malignant neoplasm of breast: Secondary | ICD-10-CM | POA: Insufficient documentation

## 2022-10-17 DIAGNOSIS — C50911 Malignant neoplasm of unspecified site of right female breast: Secondary | ICD-10-CM

## 2022-10-17 DIAGNOSIS — J00 Acute nasopharyngitis [common cold]: Secondary | ICD-10-CM | POA: Insufficient documentation

## 2022-10-17 DIAGNOSIS — R0789 Other chest pain: Secondary | ICD-10-CM | POA: Diagnosis not present

## 2022-10-17 LAB — RESP PANEL BY RT-PCR (RSV, FLU A&B, COVID)  RVPGX2
Influenza A by PCR: NEGATIVE
Influenza B by PCR: NEGATIVE
Resp Syncytial Virus by PCR: NEGATIVE
SARS Coronavirus 2 by RT PCR: NEGATIVE

## 2022-10-17 LAB — CBC WITH DIFFERENTIAL/PLATELET
Abs Immature Granulocytes: 0.01 10*3/uL (ref 0.00–0.07)
Basophils Absolute: 0 10*3/uL (ref 0.0–0.1)
Basophils Relative: 0 %
Eosinophils Absolute: 0 10*3/uL (ref 0.0–0.5)
Eosinophils Relative: 0 %
HCT: 39.8 % (ref 36.0–46.0)
Hemoglobin: 13.2 g/dL (ref 12.0–15.0)
Immature Granulocytes: 0 %
Lymphocytes Relative: 9 %
Lymphs Abs: 0.7 10*3/uL (ref 0.7–4.0)
MCH: 29.7 pg (ref 26.0–34.0)
MCHC: 33.2 g/dL (ref 30.0–36.0)
MCV: 89.6 fL (ref 80.0–100.0)
Monocytes Absolute: 0.5 10*3/uL (ref 0.1–1.0)
Monocytes Relative: 7 %
Neutro Abs: 6.5 10*3/uL (ref 1.7–7.7)
Neutrophils Relative %: 84 %
Platelets: 230 10*3/uL (ref 150–400)
RBC: 4.44 MIL/uL (ref 3.87–5.11)
RDW: 13.2 % (ref 11.5–15.5)
WBC: 7.7 10*3/uL (ref 4.0–10.5)
nRBC: 0 % (ref 0.0–0.2)

## 2022-10-17 LAB — COMPREHENSIVE METABOLIC PANEL
ALT: 20 U/L (ref 0–44)
AST: 21 U/L (ref 15–41)
Albumin: 3.9 g/dL (ref 3.5–5.0)
Alkaline Phosphatase: 67 U/L (ref 38–126)
Anion gap: 5 (ref 5–15)
BUN: 13 mg/dL (ref 6–20)
CO2: 24 mmol/L (ref 22–32)
Calcium: 8.4 mg/dL — ABNORMAL LOW (ref 8.9–10.3)
Chloride: 103 mmol/L (ref 98–111)
Creatinine, Ser: 1.02 mg/dL — ABNORMAL HIGH (ref 0.44–1.00)
GFR, Estimated: >60 mL/min (ref >60)
Glucose, Bld: 93 mg/dL (ref 70–99)
Potassium: 3.6 mmol/L (ref 3.5–5.1)
Sodium: 132 mmol/L — ABNORMAL LOW (ref 135–145)
Total Bilirubin: 0.9 mg/dL (ref 0.3–1.2)
Total Protein: 8.6 g/dL — ABNORMAL HIGH (ref 6.5–8.1)

## 2022-10-17 LAB — TROPONIN I (HIGH SENSITIVITY)
Troponin I (High Sensitivity): <2 ng/L (ref <18)
Troponin I (High Sensitivity): <2 ng/L (ref <18)

## 2022-10-17 LAB — GROUP A STREP BY PCR: Group A Strep by PCR: NOT DETECTED

## 2022-10-17 MED ORDER — IOHEXOL 350 MG/ML SOLN
100.0000 mL | Freq: Once | INTRAVENOUS | Status: AC | PRN
  Administered 2022-10-17: 100 mL via INTRAVENOUS

## 2022-10-17 MED ORDER — ONDANSETRON HCL 4 MG/2ML IJ SOLN
4.0000 mg | Freq: Once | INTRAMUSCULAR | Status: AC
  Administered 2022-10-17: 4 mg via INTRAVENOUS
  Filled 2022-10-17: qty 2

## 2022-10-17 MED ORDER — BENZONATATE 100 MG PO CAPS: 100.0000 mg | ORAL_CAPSULE | Freq: Three times a day (TID) | ORAL | 0 refills | Status: DC

## 2022-10-17 MED ORDER — LOPERAMIDE HCL 2 MG PO CAPS: 2.0000 mg | ORAL_CAPSULE | Freq: Four times a day (QID) | ORAL | 0 refills | Status: DC | PRN

## 2022-10-17 MED ORDER — SODIUM CHLORIDE 0.9 % IV BOLUS
1000.0000 mL | Freq: Once | INTRAVENOUS | Status: AC
  Administered 2022-10-17: 1000 mL via INTRAVENOUS

## 2022-10-17 MED ORDER — ACETAMINOPHEN 325 MG PO TABS
650.0000 mg | ORAL_TABLET | Freq: Once | ORAL | Status: AC
  Administered 2022-10-17: 650 mg via ORAL
  Filled 2022-10-17: qty 2

## 2022-10-17 MED ORDER — ONDANSETRON 4 MG PO TBDP: 4.0000 mg | ORAL_TABLET | Freq: Three times a day (TID) | ORAL | 0 refills | Status: DC | PRN

## 2022-10-17 NOTE — Progress Notes (Signed)
START OFF PATHWAY REGIMEN - Breast   [id number]:Trastuzumab and hyaluronidase-oysk 600 mg/10,000 units SUBQ D1 q21 Days:   A cycle is every 21 days:     Trastuzumab and hyaluronidase-oysk   **Always confirm dose/schedule in your pharmacy ordering system**  Patient Characteristics: Distant Metastases or Locoregional Recurrent Disease - Unresected or Locally Advanced Unresectable Disease Progressing after Neoadjuvant and Local Therapies, HER2 Positive, ER Negative, Chemotherapy, Second Line Therapeutic Status: Distant Metastases HER2 Status: Positive (+) ER Status: Negative (-) PR Status: Negative (-) Line of Therapy: Second Line Intent of Therapy: Non-Curative / Palliative Intent, Discussed with Patient

## 2022-10-17 NOTE — Discharge Instructions (Addendum)
Please take your medications as prescribed. Take tylenol/ibuprofen for pain/fever. I recommend close follow-up with PCP for reevaluation.  Please do not hesitate to return to emergency department if worrisome signs symptoms we discussed become apparent.

## 2022-10-17 NOTE — ED Triage Notes (Signed)
C/O sinus drainage / allergies symptoms Monday and progress to diarrhea, NV, body aches,chest tight ness, sore throat. Has been taking imodium and motrin PTA.

## 2022-10-17 NOTE — ED Provider Notes (Signed)
[facility] AT [facility] Provider Note   CSN: [id number] Arrival date & time: 10/17/22  1038     History  Chief Complaint  Patient presents with   Diarrhea   Generalized Body Aches    [name] [name] is a 52 y.o. 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  States she has been having generalized weakness, cough, diarrhea, chest pain and shortness of breath in the last 3 days.  Chest pain is intermittent, located in center of her chest, worse with talking and breathing.  She has not tried any medication at home.  History of cancer with metastatic disease to her lungs getting Herceptin treatment every 3 weeks.   Diarrhea   Past Medical History:  Diagnosis Date   Breast cancer ([place])    Breast cancer ([place])    CHF (congestive heart failure) ([place])    Family history of breast cancer    Family history of prostate cancer    GERD (gastroesophageal reflux disease)    during pregnancy    Pneumonia    hx of pneumonia 08/2013    S/P radiation therapy 05/03/2015 through 06/14/2015                                                      Right breast 4680 cGy in 26 sessions, right breast boost 1000 cGy in 5 sessions.  Right supraclavicular/axillary region 4680 cGy with a supplemental PA field to bring the axillary dose up to 4500 cGy in 26 sessions   Past Surgical History:  Procedure Laterality Date   BREAST LUMPECTOMY WITH RADIOACTIVE SEED AND SENTINEL LYMPH NODE BIOPSY Right 03/29/2015   Procedure: BREAST LUMPECTOMY WITH RADIOACTIVE SEED AND SENTINEL LYMPH NODE BIOPSY;  Surgeon: Stark Klein, MD;  Location: [facility];  Service: General;  Laterality: Right;   CESAREAN SECTION     CHOLECYSTECTOMY     ESSURE TUBAL LIGATION     PORTACATH PLACEMENT Left 07/21/2014   Procedure: INSERTION PORT-A-CATH;  Surgeon: Stark Klein, MD;  Location:  [facility];  Service: General;  Laterality: Left;   WISDOM TOOTH EXTRACTION       Home Medications Prior to Admission medications   Medication Sig Start Date End Date Taking? Authorizing Provider  benzonatate (TESSALON) 100 MG capsule Take 1 capsule (100 mg total) by mouth every 8 (eight) hours. 10/17/22  Yes Rex Kras, PA  loperamide (IMODIUM) 2 MG capsule Take 1 capsule (2 mg total) by mouth 4 (four) times daily as needed for diarrhea or loose stools. 10/17/22  Yes Rex Kras, PA  ondansetron (ZOFRAN-ODT) 4 MG disintegrating tablet Take 1 tablet (4 mg total) by mouth every 8 (eight) hours as needed for nausea or vomiting. 10/17/22  Yes Rex Kras, PA  acetaminophen (TYLENOL) 500 MG tablet Take 500 mg by mouth every 6 (six) hours as needed.    [provider]  Ascorbic Acid (VITAMIN C) 500 MG CHEW  06/14/19   [provider]  Calcium-Phosphorus-Vitamin D (CALCIUM GUMMIES PO) Take by mouth daily.    [provider]  Camphor-Eucalyptus-Menthol (VICKS VAPORUB EX) Apply 1 application. topically at bedtime. Applies under nose, throat and on chest as needed    [provider]  cetirizine (  ZYRTEC) 10 MG tablet Take 10 mg by mouth daily as needed for allergies (allergies).     [provider]  Fluocinolone Acetonide Body 0.01 % OIL Apply topically 4 (four) times a week.    [provider]  fluticasone (FLONASE) 50 MCG/ACT nasal spray Place into both nostrils daily.    [provider]  gabapentin (NEURONTIN) 100 MG capsule TAKE 2 CAPSULES(200 MG) BY MOUTH AT BEDTIME 05/31/22   Causey, Charlestine Massed, NP  hydrocortisone 2.5 % cream Apply 1 application topically 2 (two) times daily as needed.    [provider]  ibuprofen (ADVIL,MOTRIN) 800 MG tablet Take 1 tablet (800 mg total) by mouth every 8 (eight) hours as needed. 04/08/17   Dene Gentry, MD  lidocaine-prilocaine (EMLA) cream Apply as directed prior to chemotherapy 10/19/21   Heilingoetter,  Cassandra L, PA-C  loperamide (IMODIUM A-D) 2 MG tablet Take 1 tablet (2 mg total) by mouth 4 (four) times daily as needed for diarrhea or loose stools. 09/21/14   [name], MD  losartan (COZAAR) 25 MG tablet TAKE 1 TABLET(25 MG) BY MOUTH DAILY 03/21/22   Bensimhon, Shaune Pascal, MD  minoxidil (LONITEN) 2.5 MG tablet Take 2.5 mg by mouth daily. 1/2 tab once daily    [provider]  Multiple Vitamins-Minerals (MULTIVITAMIN GUMMIES ADULT PO) Take 1 each by mouth daily. Women's Vitafusion gummie    [provider]  spironolactone (ALDACTONE) 25 MG tablet TAKE 1 TABLET(25 MG) BY MOUTH DAILY 08/17/22   Bensimhon, Shaune Pascal, MD      Allergies    Adhesive [tape], Aspirin, and Codeine    Review of Systems   Review of Systems  Gastrointestinal:  Positive for diarrhea.    Physical Exam Updated Vital Signs BP 116/72   Pulse 94   Temp 99.7 F (37.6 C) (Oral)   Resp (!) 21   Ht '5\' 10"'$  (1.778 m)   Wt 107.5 kg   SpO2 96%   BMI 34.01 kg/m  Physical Exam Vitals and nursing note reviewed.  Constitutional:      Appearance: Normal appearance.  HENT:     Head: Normocephalic and atraumatic.     Mouth/Throat:     Mouth: Mucous membranes are dry.  Eyes:     General: No scleral icterus. Cardiovascular:     Rate and Rhythm: Normal rate and regular rhythm.     Pulses: Normal pulses.     Heart sounds: Normal heart sounds.  Pulmonary:     Effort: Pulmonary effort is normal.     Breath sounds: Normal breath sounds.  Abdominal:     General: Abdomen is flat.     Palpations: Abdomen is soft.     Tenderness: There is no abdominal tenderness.  Musculoskeletal:        General: No deformity.  Skin:    General: Skin is warm and dry.     Findings: No rash.  Neurological:     General: No focal deficit present.     Mental Status: She is alert.  Psychiatric:        Mood and Affect: Mood normal.     ED Results / Procedures / Treatments   Labs (all labs ordered are listed, but only  abnormal results are displayed) Labs Reviewed  COMPREHENSIVE METABOLIC PANEL - Abnormal; Notable for the following components:      Result Value   Sodium 132 (*)    Creatinine, Ser 1.02 (*)    Calcium 8.4 (*)  Total Protein 8.6 (*)    All other components within normal limits  GROUP A STREP BY PCR  RESP PANEL BY RT-PCR (RSV, FLU A&B, COVID)  RVPGX2  CBC WITH DIFFERENTIAL/PLATELET  TROPONIN I (HIGH SENSITIVITY)  TROPONIN I (HIGH SENSITIVITY)    EKG None  Radiology CT Angio Chest PE W and/or Wo Contrast  Result Date: 10/17/2022 CLINICAL DATA:  Diarrhea, abdominal pain, chest pain, shortness of breath, history of breast carcinoma EXAM: CT ANGIOGRAPHY CHEST CT ABDOMEN AND PELVIS WITH CONTRAST TECHNIQUE: Multidetector CT imaging of the chest was performed using the standard protocol during bolus administration of intravenous contrast. Multiplanar CT image reconstructions and MIPs were obtained to evaluate the vascular anatomy. Multidetector CT imaging of the abdomen and pelvis was performed using the standard protocol during bolus administration of intravenous contrast. RADIATION DOSE REDUCTION: This exam was performed according to the departmental dose-optimization program which includes automated exposure control, adjustment of the mA and/or kV according to patient size and/or use of iterative reconstruction technique. CONTRAST:  115m OMNIPAQUE IOHEXOL 350 MG/ML SOLN COMPARISON:  01/15/2022 and previous FINDINGS: CTA CHEST FINDINGS Cardiovascular: Left subclavian port catheter to the distal SVC. SVC patent. Heart size upper limits normal. No pericardial effusion. Satisfactory opacification of pulmonary arteries noted, and there is no evidence of pulmonary emboli. Adequate contrast opacification of the thoracic aorta with no evidence of dissection, aneurysm, or stenosis. There is bovine variant brachiocephalic arch anatomy without proximal stenosis. Mediastinum/Nodes: No mediastinal mass or  adenopathy. Lungs/Pleura: No pleural effusion. No pneumothorax. Relatively low lung volumes with crowding of bronchovascular structures. Subpleural scarring in the anterior right upper lobe. No new airspace disease or nodule. Musculoskeletal: Thoracic dextroscoliosis. No fracture or other acute finding. Surgical clips right breast and axilla. Review of the MIP images confirms the above findings. CT ABDOMEN and PELVIS FINDINGS Hepatobiliary: No focal liver abnormality is seen. Status post cholecystectomy. No biliary dilatation. Pancreas: Unremarkable. No pancreatic ductal dilatation or surrounding inflammatory changes. Spleen: Normal in size without focal abnormality. Adrenals/Urinary Tract: 2 mm calculus, lower pole right renal collecting system. No hydronephrosis. No focal renal lesion. No adrenal mass. Urinary bladder incompletely distended. Stomach/Bowel: Stomach nondistended. Small bowel decompressed. Normal appendix. Colon is incompletely distended by gas and fluid with scattered diverticula predominately in the distal descending segment; no adjacent inflammatory change. Vascular/Lymphatic: No significant vascular findings are present. No enlarged abdominal or pelvic lymph nodes. Reproductive: Uterus unremarkable. Bilateral Essure devices. No adnexal mass. Other: Left pelvic phleboliths.  No ascites.  No free air. Musculoskeletal: Compensatory mild lumbar levoscoliosis. No fracture or worrisome bone lesion. Review of the MIP images confirms the above findings. IMPRESSION: 1. Negative for acute PE or thoracic aortic dissection. 2. No acute findings. 3. Colonic diverticulosis. 4. Right nephrolithiasis without hydronephrosis. Electronically Signed   By: DLucrezia EuropeM.D.   On: 10/17/2022 14:17   CT ABDOMEN PELVIS W CONTRAST  Result Date: 10/17/2022 CLINICAL DATA:  Diarrhea, abdominal pain, chest pain, shortness of breath, history of breast carcinoma EXAM: CT ANGIOGRAPHY CHEST CT ABDOMEN AND PELVIS WITH CONTRAST  TECHNIQUE: Multidetector CT imaging of the chest was performed using the standard protocol during bolus administration of intravenous contrast. Multiplanar CT image reconstructions and MIPs were obtained to evaluate the vascular anatomy. Multidetector CT imaging of the abdomen and pelvis was performed using the standard protocol during bolus administration of intravenous contrast. RADIATION DOSE REDUCTION: This exam was performed according to the departmental dose-optimization program which includes automated exposure control, adjustment of the mA and/or kV  according to patient size and/or use of iterative reconstruction technique. CONTRAST:  166m OMNIPAQUE IOHEXOL 350 MG/ML SOLN COMPARISON:  01/15/2022 and previous FINDINGS: CTA CHEST FINDINGS Cardiovascular: Left subclavian port catheter to the distal SVC. SVC patent. Heart size upper limits normal. No pericardial effusion. Satisfactory opacification of pulmonary arteries noted, and there is no evidence of pulmonary emboli. Adequate contrast opacification of the thoracic aorta with no evidence of dissection, aneurysm, or stenosis. There is bovine variant brachiocephalic arch anatomy without proximal stenosis. Mediastinum/Nodes: No mediastinal mass or adenopathy. Lungs/Pleura: No pleural effusion. No pneumothorax. Relatively low lung volumes with crowding of bronchovascular structures. Subpleural scarring in the anterior right upper lobe. No new airspace disease or nodule. Musculoskeletal: Thoracic dextroscoliosis. No fracture or other acute finding. Surgical clips right breast and axilla. Review of the MIP images confirms the above findings. CT ABDOMEN and PELVIS FINDINGS Hepatobiliary: No focal liver abnormality is seen. Status post cholecystectomy. No biliary dilatation. Pancreas: Unremarkable. No pancreatic ductal dilatation or surrounding inflammatory changes. Spleen: Normal in size without focal abnormality. Adrenals/Urinary Tract: 2 mm calculus, lower pole  right renal collecting system. No hydronephrosis. No focal renal lesion. No adrenal mass. Urinary bladder incompletely distended. Stomach/Bowel: Stomach nondistended. Small bowel decompressed. Normal appendix. Colon is incompletely distended by gas and fluid with scattered diverticula predominately in the distal descending segment; no adjacent inflammatory change. Vascular/Lymphatic: No significant vascular findings are present. No enlarged abdominal or pelvic lymph nodes. Reproductive: Uterus unremarkable. Bilateral Essure devices. No adnexal mass. Other: Left pelvic phleboliths.  No ascites.  No free air. Musculoskeletal: Compensatory mild lumbar levoscoliosis. No fracture or worrisome bone lesion. Review of the MIP images confirms the above findings. IMPRESSION: 1. Negative for acute PE or thoracic aortic dissection. 2. No acute findings. 3. Colonic diverticulosis. 4. Right nephrolithiasis without hydronephrosis. Electronically Signed   By: DLucrezia EuropeM.D.   On: 10/17/2022 14:17    Procedures Procedures    Medications Ordered in ED Medications  sodium chloride 0.9 % bolus 1,000 mL ( Intravenous Stopped 10/17/22 1343)  acetaminophen (TYLENOL) tablet 650 mg (650 mg Oral Given 10/17/22 1207)  ondansetron (ZOFRAN) injection 4 mg (4 mg Intravenous Given 10/17/22 1223)  iohexol (OMNIPAQUE) 350 MG/ML injection 100 mL (100 mLs Intravenous Contrast Given 10/17/22 1321)    ED Course/ Medical Decision Making/ A&P                             Medical Decision Making Amount and/or Complexity of Data Reviewed Labs: ordered. Radiology: ordered.  Risk OTC drugs. Prescription drug management.   This patient presents to the ED for generalized weakness, chest pain, shortness of breath, diarrhea, this involves an extensive number of treatment options, and is a complaint that carries with a high risk of complications and morbidity.  The differential diagnosis includes viral illness,acs/MI, pneumonia,  diverticulosis, diverticulitis, appendicitis, gastroenteritis, gastritis, infectious etiology.  This is not an exhaustive list.  Lab tests: I ordered and personally interpreted labs.  The pertinent results include: WBC unremarkable. Hbg unremarkable. Platelets unremarkable. Electrolytes unremarkable. BUN, creatinine unremarkable.  Viral panel negative.  Strep negative.  Imaging studies: I ordered imaging studies. I personally reviewed, interpreted imaging and agree with the radiologist's interpretations. The results include: CT scan showed diverticulosis and right nephrolithiasis with no hydronephrosis.   Problem list/ ED course/ Critical interventions/ Medical management: HPI: See above Vital signs within normal range and stable throughout visit. Laboratory/imaging studies significant for: See above. On physical  examination, patient is afebrile and appears in no acute distress. This patient presents with symptoms suspicious for likely viral upper respiratory infection. Based on history and physical doubt sinusitis. COVID test was negative. Do not suspect underlying cardiopulmonary process. I considered, but think unlikely, dangerous causes of this patient's symptoms to include ACS, CHF or COPD exacerbations, pneumonia, pneumothorax.  CT scan was negative for PE.  It did show diverticulosis with no diverticulitis and nephrolithiasis with no hydronephrosis.  Patient is nontoxic appearing and not in need of emergent medical intervention. Patient told to self isolate at home until symptoms subside. Advised patient to take Tylenol/ibuprofen/naproxen for pain, Zofran for nausea, Imodium for diarrhea follow-up with primary care physician for further evaluation and management, return to the ER if new or worsening symptoms.  I have reviewed the patient home medicines and have made adjustments as needed.  Cardiac monitoring/EKG: The patient was maintained on a cardiac monitor.  I personally reviewed and  interpreted the cardiac monitor which showed an underlying rhythm of: sinus rhythm.  Additional history obtained: External records from outside source obtained and reviewed including: Chart review including previous notes, labs, imaging.  Consultations obtained:  Disposition Continued outpatient therapy. Follow-up with PCP recommended for reevaluation of symptoms. Treatment plan discussed with patient.  Pt acknowledged understanding was agreeable to the plan. Worrisome signs and symptoms were discussed with patient, and patient acknowledged understanding to return to the ED if they noticed these signs and symptoms. Patient was stable upon discharge.   This chart was dictated using voice recognition software.  Despite best efforts to proofread,  errors can occur which can change the documentation meaning.          Final Clinical Impression(s) / ED Diagnoses Final diagnoses:  Acute cough  Diarrhea, unspecified type  Chest pain, unspecified type    Rx / DC Orders ED Discharge Orders          Ordered    ondansetron (ZOFRAN-ODT) 4 MG disintegrating tablet  Every 8 hours PRN        10/17/22 1544    benzonatate (TESSALON) 100 MG capsule  Every 8 hours        10/17/22 1544    loperamide (IMODIUM) 2 MG capsule  4 times daily PRN        10/17/22 1544              Rex Kras, PA 10/17/22 [place], [place], DO 10/18/22 1813

## 2022-10-18 ENCOUNTER — Other Ambulatory Visit: Payer: Self-pay | Admitting: Hematology

## 2022-10-18 DIAGNOSIS — C50911 Malignant neoplasm of unspecified site of right female breast: Secondary | ICD-10-CM

## 2022-10-24 ENCOUNTER — Other Ambulatory Visit: Payer: Self-pay

## 2022-10-24 DIAGNOSIS — C50911 Malignant neoplasm of unspecified site of right female breast: Secondary | ICD-10-CM

## 2022-10-25 ENCOUNTER — Inpatient Hospital Stay: Payer: BC Managed Care – PPO | Attending: Hematology

## 2022-10-25 ENCOUNTER — Inpatient Hospital Stay: Payer: BC Managed Care – PPO

## 2022-10-25 ENCOUNTER — Other Ambulatory Visit: Payer: Self-pay | Admitting: Hematology

## 2022-10-25 VITALS — BP 132/67 | HR 69 | Temp 98.6°F | Resp 20 | Ht 70.0 in | Wt 232.8 lb

## 2022-10-25 DIAGNOSIS — Z95828 Presence of other vascular implants and grafts: Secondary | ICD-10-CM

## 2022-10-25 DIAGNOSIS — C50311 Malignant neoplasm of lower-inner quadrant of right female breast: Secondary | ICD-10-CM | POA: Insufficient documentation

## 2022-10-25 DIAGNOSIS — C50911 Malignant neoplasm of unspecified site of right female breast: Secondary | ICD-10-CM

## 2022-10-25 DIAGNOSIS — Z5112 Encounter for antineoplastic immunotherapy: Secondary | ICD-10-CM | POA: Insufficient documentation

## 2022-10-25 LAB — CBC WITH DIFFERENTIAL (CANCER CENTER ONLY)
Abs Immature Granulocytes: 0.02 10*3/uL (ref 0.00–0.07)
Basophils Absolute: 0 10*3/uL (ref 0.0–0.1)
Basophils Relative: 0 %
Eosinophils Absolute: 0.1 10*3/uL (ref 0.0–0.5)
Eosinophils Relative: 2 %
HCT: 36.9 % (ref 36.0–46.0)
Hemoglobin: 12.5 g/dL (ref 12.0–15.0)
Immature Granulocytes: 0 %
Lymphocytes Relative: 30 %
Lymphs Abs: 2.2 10*3/uL (ref 0.7–4.0)
MCH: 30 pg (ref 26.0–34.0)
MCHC: 33.9 g/dL (ref 30.0–36.0)
MCV: 88.7 fL (ref 80.0–100.0)
Monocytes Absolute: 0.6 10*3/uL (ref 0.1–1.0)
Monocytes Relative: 7 %
Neutro Abs: 4.5 10*3/uL (ref 1.7–7.7)
Neutrophils Relative %: 61 %
Platelet Count: 275 10*3/uL (ref 150–400)
RBC: 4.16 MIL/uL (ref 3.87–5.11)
RDW: 13 % (ref 11.5–15.5)
WBC Count: 7.4 10*3/uL (ref 4.0–10.5)
nRBC: 0 % (ref 0.0–0.2)

## 2022-10-25 LAB — CMP (CANCER CENTER ONLY)
ALT: 21 U/L (ref 0–44)
AST: 11 U/L — ABNORMAL LOW (ref 15–41)
Albumin: 4 g/dL (ref 3.5–5.0)
Alkaline Phosphatase: 61 U/L (ref 38–126)
Anion gap: 5 (ref 5–15)
BUN: 13 mg/dL (ref 6–20)
CO2: 30 mmol/L (ref 22–32)
Calcium: 9.1 mg/dL (ref 8.9–10.3)
Chloride: 104 mmol/L (ref 98–111)
Creatinine: 0.85 mg/dL (ref 0.44–1.00)
GFR, Estimated: >60 mL/min (ref >60)
Glucose, Bld: 79 mg/dL (ref 70–99)
Potassium: 3.9 mmol/L (ref 3.5–5.1)
Sodium: 139 mmol/L (ref 135–145)
Total Bilirubin: 0.4 mg/dL (ref 0.3–1.2)
Total Protein: 7.8 g/dL (ref 6.5–8.1)

## 2022-10-25 MED ORDER — SODIUM CHLORIDE 0.9% FLUSH
10.0000 mL | Freq: Once | INTRAVENOUS | Status: AC
  Administered 2022-10-25: 10 mL

## 2022-10-25 MED ORDER — ACETAMINOPHEN 325 MG PO TABS: 650.0000 mg | ORAL_TABLET | Freq: Once | ORAL | Status: DC

## 2022-10-25 MED ORDER — SODIUM CHLORIDE 0.9 % IV SOLN: Freq: Once | INTRAVENOUS | Status: AC

## 2022-10-25 MED ORDER — HEPARIN SOD (PORK) LOCK FLUSH 100 UNIT/ML IV SOLN
500.0000 [IU] | Freq: Once | INTRAVENOUS | Status: AC | PRN
  Administered 2022-10-25: 500 [IU]

## 2022-10-25 MED ORDER — SODIUM CHLORIDE 0.9% FLUSH
10.0000 mL | INTRAVENOUS | Status: DC | PRN
  Administered 2022-10-25: 10 mL

## 2022-10-25 MED ORDER — TRASTUZUMAB-ANNS CHEMO 150 MG IV SOLR
6.0000 mg/kg | Freq: Once | INTRAVENOUS | Status: AC
  Administered 2022-10-25: 600 mg via INTRAVENOUS
  Filled 2022-10-25: qty 28.57

## 2022-10-25 NOTE — Patient Instructions (Signed)
[facility]  Discharge Instructions: Thank you for choosing [facility] to provide your oncology and hematology care.   If you have a lab appointment with the [facility], please go directly to the [facility] and check in at the registration area.   Wear comfortable clothing and clothing appropriate for easy access to any Portacath or PICC line.   We strive to give you quality time with your provider. You may need to reschedule your appointment if you arrive late (15 or more minutes).  Arriving late affects you and other patients whose appointments are after yours.  Also, if you miss three or more appointments without notifying the office, you may be dismissed from the clinic at the provider's discretion.      For prescription refill requests, have your pharmacy contact our office and allow 72 hours for refills to be completed.    Today you received the following chemotherapy and/or immunotherapy agents kanjinti  (trastuzumab)   To help prevent nausea and vomiting after your treatment, we encourage you to take your nausea medication as directed.  BELOW ARE SYMPTOMS THAT SHOULD BE REPORTED IMMEDIATELY: *FEVER GREATER THAN 100.4 F (38 C) OR HIGHER *CHILLS OR SWEATING *NAUSEA AND VOMITING THAT IS NOT CONTROLLED WITH YOUR NAUSEA MEDICATION *UNUSUAL SHORTNESS OF BREATH *UNUSUAL BRUISING OR BLEEDING *URINARY PROBLEMS (pain or burning when urinating, or frequent urination) *BOWEL PROBLEMS (unusual diarrhea, constipation, pain near the anus) TENDERNESS IN MOUTH AND THROAT WITH OR WITHOUT PRESENCE OF ULCERS (sore throat, sores in mouth, or a toothache) UNUSUAL RASH, SWELLING OR PAIN  UNUSUAL VAGINAL DISCHARGE OR ITCHING   Items with * indicate a potential emergency and should be followed up as soon as possible or go to the Emergency Department if any problems should occur.  Please show the CHEMOTHERAPY ALERT CARD or IMMUNOTHERAPY ALERT  CARD at check-in to the Emergency Department and triage nurse.  Should you have questions after your visit or need to cancel or reschedule your appointment, please contact [facility]  Dept: [phone]  and follow the prompts.  Office hours are 8:00 a.m. to 4:30 p.m. Monday - Friday. Please note that voicemails left after 4:00 p.m. may not be returned until the following business day.  We are closed weekends and major holidays. You have access to a nurse at all times for urgent questions. Please call the main number to the clinic Dept: [phone] and follow the prompts.   For any non-urgent questions, you may also contact your provider using MyChart. We now offer e-Visits for anyone 21 and older to request care online for non-urgent symptoms. For details visit [email or web address].   Also download the MyChart app! Go to the app store, search "MyChart", open the app, select [facility], and log in with your MyChart username and password.  Trastuzumab Injection What is this medication? TRASTUZUMAB (tras TOO zoo mab) treats breast cancer and stomach cancer. It works by blocking a protein that causes cancer cells to grow and multiply. This helps to slow or stop the spread of cancer cells. This medicine may be used for other purposes; ask your health care provider or pharmacist if you have questions. COMMON BRAND NAME(S): Herceptin, Janae Bridgeman, Ontruzant, Trazimera What should I tell my care team before I take this medication? They need to know if you have any of these conditions: Heart failure Lung disease An unusual or allergic reaction to trastuzumab, other medications, foods,  dyes, or preservatives Pregnant or trying to get pregnant Breast-feeding How should I use this medication? This medication is injected into a vein. It is given by your care team in a hospital or clinic setting. Talk to your care team about the use of this  medication in children. It is not approved for use in children. Overdosage: If you think you have taken too much of this medicine contact a poison control center or emergency room at once. NOTE: This medicine is only for you. Do not share this medicine with others. What if I miss a dose? Keep appointments for follow-up doses. It is important not to miss your dose. Call your care team if you are unable to keep an appointment. What may interact with this medication? Certain types of chemotherapy, such as daunorubicin, doxorubicin, epirubicin, idarubicin This list may not describe all possible interactions. Give your health care provider a list of all the medicines, herbs, non-prescription drugs, or dietary supplements you use. Also tell them if you smoke, drink alcohol, or use illegal drugs. Some items may interact with your medicine. What should I watch for while using this medication? Your condition will be monitored carefully while you are receiving this medication. This medication may make you feel generally unwell. This is not uncommon, as chemotherapy affects healthy cells as well as cancer cells. Report any side effects. Continue your course of treatment even though you feel ill unless your care team tells you to stop. This medication may increase your risk of getting an infection. Call your care team for advice if you get a fever, chills, sore throat, or other symptoms of a cold or flu. Do not treat yourself. Try to avoid being around people who are sick. Avoid taking medications that contain aspirin, acetaminophen, ibuprofen, naproxen, or ketoprofen unless instructed by your care team. These medications can hide a fever. Talk to your care team if you may be pregnant. Serious birth defects can occur if you take this medication during pregnancy and for 7 months after the last dose. You will need a negative pregnancy test before starting this medication. Contraception is recommended while taking  this medication and for 7 months after the last dose. Your care team can help you find the option that works for you. Do not breastfeed while taking this medication and for 7 months after stopping treatment. What side effects may I notice from receiving this medication? Side effects that you should report to your care team as soon as possible: Allergic reactions or angioedema--skin rash, itching or hives, swelling of the face, eyes, lips, tongue, arms, or legs, trouble swallowing or breathing Dry cough, shortness of breath or trouble breathing Heart failure--shortness of breath, swelling of the ankles, feet, or hands, sudden weight gain, unusual weakness or fatigue Infection--fever, chills, cough, or sore throat Infusion reactions--chest pain, shortness of breath or trouble breathing, feeling faint or lightheaded Side effects that usually do not require medical attention (report to your care team if they continue or are bothersome): Diarrhea Dizziness Headache Nausea Trouble sleeping Vomiting This list may not describe all possible side effects. Call your doctor for medical advice about side effects. You may report side effects to FDA at 1-800-FDA-1088. Where should I keep my medication? This medication is given in a hospital or clinic. It will not be stored at home. NOTE: This sheet is a summary. It may not cover all possible information. If you have questions about this medicine, talk to your doctor, pharmacist, or health care provider.  2023 Elsevier/Gold Standard (2021-11-30 00:00:00)

## 2022-11-07 ENCOUNTER — Other Ambulatory Visit (HOSPITAL_COMMUNITY): Payer: BC Managed Care – PPO

## 2022-11-12 ENCOUNTER — Ambulatory Visit (HOSPITAL_COMMUNITY): Payer: BC Managed Care – PPO | Attending: Cardiovascular Disease

## 2022-11-12 DIAGNOSIS — I361 Nonrheumatic tricuspid (valve) insufficiency: Secondary | ICD-10-CM | POA: Diagnosis not present

## 2022-11-12 DIAGNOSIS — C50911 Malignant neoplasm of unspecified site of right female breast: Secondary | ICD-10-CM | POA: Diagnosis not present

## 2022-11-12 DIAGNOSIS — C7800 Secondary malignant neoplasm of unspecified lung: Secondary | ICD-10-CM | POA: Insufficient documentation

## 2022-11-12 LAB — ECHOCARDIOGRAM COMPLETE
Area-P 1/2: 3.72 cm2
Calc EF: 64.1 %
S' Lateral: 3 cm
Single Plane A2C EF: 59.7 %
Single Plane A4C EF: 65.9 %

## 2022-11-13 ENCOUNTER — Other Ambulatory Visit: Payer: Self-pay

## 2022-11-15 ENCOUNTER — Inpatient Hospital Stay: Payer: BC Managed Care – PPO | Attending: Hematology

## 2022-11-15 VITALS — BP 134/67 | HR 72 | Temp 98.3°F | Resp 21 | Wt 234.5 lb

## 2022-11-15 DIAGNOSIS — C50311 Malignant neoplasm of lower-inner quadrant of right female breast: Secondary | ICD-10-CM | POA: Diagnosis not present

## 2022-11-15 DIAGNOSIS — Z5112 Encounter for antineoplastic immunotherapy: Secondary | ICD-10-CM | POA: Insufficient documentation

## 2022-11-15 DIAGNOSIS — C50911 Malignant neoplasm of unspecified site of right female breast: Secondary | ICD-10-CM

## 2022-11-15 MED ORDER — TRASTUZUMAB-ANNS CHEMO 150 MG IV SOLR
6.0000 mg/kg | Freq: Once | INTRAVENOUS | Status: AC
  Administered 2022-11-15: 600 mg via INTRAVENOUS
  Filled 2022-11-15: qty 28.57

## 2022-11-15 MED ORDER — SODIUM CHLORIDE 0.9 % IV SOLN: Freq: Once | INTRAVENOUS | Status: AC

## 2022-11-15 MED ORDER — SODIUM CHLORIDE 0.9% FLUSH
10.0000 mL | INTRAVENOUS | Status: DC | PRN
  Administered 2022-11-15: 10 mL

## 2022-11-15 MED ORDER — HEPARIN SOD (PORK) LOCK FLUSH 100 UNIT/ML IV SOLN
500.0000 [IU] | Freq: Once | INTRAVENOUS | Status: AC | PRN
  Administered 2022-11-15: 500 [IU]

## 2022-11-15 NOTE — Patient Instructions (Signed)
[facility]  Discharge Instructions: Thank you for choosing [facility] to provide your oncology and hematology care.   If you have a lab appointment with the Cancer Center, please go directly to the Cancer Center and check in at the registration area.   Wear comfortable clothing and clothing appropriate for easy access to any Portacath or PICC line.   We strive to give you quality time with your provider. You may need to reschedule your appointment if you arrive late (15 or more minutes).  Arriving late affects you and other patients whose appointments are after yours.  Also, if you miss three or more appointments without notifying the office, you may be dismissed from the clinic at the provider's discretion.      For prescription refill requests, have your pharmacy contact our office and allow 72 hours for refills to be completed.    Today you received the following chemotherapy and/or immunotherapy agents: Kanjinti.      To help prevent nausea and vomiting after your treatment, we encourage you to take your nausea medication as directed.  BELOW ARE SYMPTOMS THAT SHOULD BE REPORTED IMMEDIATELY: *FEVER GREATER THAN 100.4 F (38 C) OR HIGHER *CHILLS OR SWEATING *NAUSEA AND VOMITING THAT IS NOT CONTROLLED WITH YOUR NAUSEA MEDICATION *UNUSUAL SHORTNESS OF BREATH *UNUSUAL BRUISING OR BLEEDING *URINARY PROBLEMS (pain or burning when urinating, or frequent urination) *BOWEL PROBLEMS (unusual diarrhea, constipation, pain near the anus) TENDERNESS IN MOUTH AND THROAT WITH OR WITHOUT PRESENCE OF ULCERS (sore throat, sores in mouth, or a toothache) UNUSUAL RASH, SWELLING OR PAIN  UNUSUAL VAGINAL DISCHARGE OR ITCHING   Items with * indicate a potential emergency and should be followed up as soon as possible or go to the Emergency Department if any problems should occur.  Please show the CHEMOTHERAPY ALERT CARD or IMMUNOTHERAPY ALERT CARD at  check-in to the Emergency Department and triage nurse.  Should you have questions after your visit or need to cancel or reschedule your appointment, please contact [facility]  Dept: [phone]  and follow the prompts.  Office hours are 8:00 a.m. to 4:30 p.m. Monday - Friday. Please note that voicemails left after 4:00 p.m. may not be returned until the following business day.  We are closed weekends and major holidays. You have access to a nurse at all times for urgent questions. Please call the main number to the clinic Dept: [phone] and follow the prompts.   For any non-urgent questions, you may also contact your provider using MyChart. We now offer e-Visits for anyone 18 and older to request care online for non-urgent symptoms. For details visit [email or web address].   Also download the MyChart app! Go to the app store, search "MyChart", open the app, select [facility], and log in with your MyChart username and password.   

## 2022-11-16 ENCOUNTER — Other Ambulatory Visit: Payer: Self-pay | Admitting: Physician Assistant

## 2022-11-16 DIAGNOSIS — C50511 Malignant neoplasm of lower-outer quadrant of right female breast: Secondary | ICD-10-CM

## 2022-11-21 ENCOUNTER — Other Ambulatory Visit: Payer: Self-pay

## 2022-11-23 ENCOUNTER — Telehealth: Payer: Self-pay | Admitting: Hematology

## 2022-11-23 NOTE — Telephone Encounter (Signed)
Contacted patient to scheduled appointments. Left message with appointment details and a call back number if patient had any questions or could not accommodate the time we provided.   

## 2022-12-03 ENCOUNTER — Other Ambulatory Visit: Payer: Self-pay | Admitting: Internal Medicine

## 2022-12-04 ENCOUNTER — Other Ambulatory Visit (HOSPITAL_COMMUNITY): Payer: Self-pay | Admitting: Internal Medicine

## 2022-12-04 ENCOUNTER — Inpatient Hospital Stay: Payer: BC Managed Care – PPO

## 2022-12-04 VITALS — BP 139/77 | HR 71 | Temp 98.4°F | Resp 18 | Wt 232.8 lb

## 2022-12-04 DIAGNOSIS — C50911 Malignant neoplasm of unspecified site of right female breast: Secondary | ICD-10-CM

## 2022-12-04 DIAGNOSIS — Z5112 Encounter for antineoplastic immunotherapy: Secondary | ICD-10-CM | POA: Diagnosis not present

## 2022-12-04 DIAGNOSIS — C50311 Malignant neoplasm of lower-inner quadrant of right female breast: Secondary | ICD-10-CM | POA: Diagnosis not present

## 2022-12-04 MED ORDER — TRASTUZUMAB-ANNS CHEMO 150 MG IV SOLR
6.0000 mg/kg | Freq: Once | INTRAVENOUS | Status: AC
  Administered 2022-12-04: 600 mg via INTRAVENOUS
  Filled 2022-12-04: qty 28.57

## 2022-12-04 MED ORDER — SODIUM CHLORIDE 0.9% FLUSH
10.0000 mL | INTRAVENOUS | Status: DC | PRN
  Administered 2022-12-04: 10 mL

## 2022-12-04 MED ORDER — HEPARIN SOD (PORK) LOCK FLUSH 100 UNIT/ML IV SOLN
500.0000 [IU] | Freq: Once | INTRAVENOUS | Status: AC | PRN
  Administered 2022-12-04: 500 [IU]

## 2022-12-04 MED ORDER — SODIUM CHLORIDE 0.9 % IV SOLN: Freq: Once | INTRAVENOUS | Status: AC

## 2022-12-04 NOTE — Patient Instructions (Signed)
[facility]  Discharge Instructions: Thank you for choosing [facility] to provide your oncology and hematology care.   If you have a lab appointment with the Cancer Center, please go directly to the Cancer Center and check in at the registration area.   Wear comfortable clothing and clothing appropriate for easy access to any Portacath or PICC line.   We strive to give you quality time with your provider. You may need to reschedule your appointment if you arrive late (15 or more minutes).  Arriving late affects you and other patients whose appointments are after yours.  Also, if you miss three or more appointments without notifying the office, you may be dismissed from the clinic at the provider's discretion.      For prescription refill requests, have your pharmacy contact our office and allow 72 hours for refills to be completed.    Today you received the following chemotherapy and/or immunotherapy agents kanjinti  (trastuzumab)   To help prevent nausea and vomiting after your treatment, we encourage you to take your nausea medication as directed.  BELOW ARE SYMPTOMS THAT SHOULD BE REPORTED IMMEDIATELY: *FEVER GREATER THAN 100.4 F (38 C) OR HIGHER *CHILLS OR SWEATING *NAUSEA AND VOMITING THAT IS NOT CONTROLLED WITH YOUR NAUSEA MEDICATION *UNUSUAL SHORTNESS OF BREATH *UNUSUAL BRUISING OR BLEEDING *URINARY PROBLEMS (pain or burning when urinating, or frequent urination) *BOWEL PROBLEMS (unusual diarrhea, constipation, pain near the anus) TENDERNESS IN MOUTH AND THROAT WITH OR WITHOUT PRESENCE OF ULCERS (sore throat, sores in mouth, or a toothache) UNUSUAL RASH, SWELLING OR PAIN  UNUSUAL VAGINAL DISCHARGE OR ITCHING   Items with * indicate a potential emergency and should be followed up as soon as possible or go to the Emergency Department if any problems should occur.  Please show the CHEMOTHERAPY ALERT CARD or IMMUNOTHERAPY ALERT  CARD at check-in to the Emergency Department and triage nurse.  Should you have questions after your visit or need to cancel or reschedule your appointment, please contact [facility]  Dept: [phone]  and follow the prompts.  Office hours are 8:00 a.m. to 4:30 p.m. Monday - Friday. Please note that voicemails left after 4:00 p.m. may not be returned until the following business day.  We are closed weekends and major holidays. You have access to a nurse at all times for urgent questions. Please call the main number to the clinic Dept: [phone] and follow the prompts.   For any non-urgent questions, you may also contact your provider using MyChart. We now offer e-Visits for anyone 18 and older to request care online for non-urgent symptoms. For details visit [email or web address].   Also download the MyChart app! Go to the app store, search "MyChart", open the app, select [facility], and log in with your MyChart username and password.  Trastuzumab Injection What is this medication? TRASTUZUMAB (tras TOO zoo mab) treats breast cancer and stomach cancer. It works by blocking a protein that causes cancer cells to grow and multiply. This helps to slow or stop the spread of cancer cells. This medicine may be used for other purposes; ask your health care provider or pharmacist if you have questions. COMMON BRAND NAME(S): Herceptin, Herzuma, KANJINTI, Ogivri, Ontruzant, Trazimera What should I tell my care team before I take this medication? They need to know if you have any of these conditions: Heart failure Lung disease An unusual or allergic reaction to trastuzumab, other medications, foods,   dyes, or preservatives Pregnant or trying to get pregnant Breast-feeding How should I use this medication? This medication is injected into a vein. It is given by your care team in a hospital or clinic setting. Talk to your care team about the use of this  medication in children. It is not approved for use in children. Overdosage: If you think you have taken too much of this medicine contact a poison control center or emergency room at once. NOTE: This medicine is only for you. Do not share this medicine with others. What if I miss a dose? Keep appointments for follow-up doses. It is important not to miss your dose. Call your care team if you are unable to keep an appointment. What may interact with this medication? Certain types of chemotherapy, such as daunorubicin, doxorubicin, epirubicin, idarubicin This list may not describe all possible interactions. Give your health care provider a list of all the medicines, herbs, non-prescription drugs, or dietary supplements you use. Also tell them if you smoke, drink alcohol, or use illegal drugs. Some items may interact with your medicine. What should I watch for while using this medication? Your condition will be monitored carefully while you are receiving this medication. This medication may make you feel generally unwell. This is not uncommon, as chemotherapy affects healthy cells as well as cancer cells. Report any side effects. Continue your course of treatment even though you feel ill unless your care team tells you to stop. This medication may increase your risk of getting an infection. Call your care team for advice if you get a fever, chills, sore throat, or other symptoms of a cold or flu. Do not treat yourself. Try to avoid being around people who are sick. Avoid taking medications that contain aspirin, acetaminophen, ibuprofen, naproxen, or ketoprofen unless instructed by your care team. These medications can hide a fever. Talk to your care team if you may be pregnant. Serious birth defects can occur if you take this medication during pregnancy and for 7 months after the last dose. You will need a negative pregnancy test before starting this medication. Contraception is recommended while taking  this medication and for 7 months after the last dose. Your care team can help you find the option that works for you. Do not breastfeed while taking this medication and for 7 months after stopping treatment. What side effects may I notice from receiving this medication? Side effects that you should report to your care team as soon as possible: Allergic reactions or angioedema--skin rash, itching or hives, swelling of the face, eyes, lips, tongue, arms, or legs, trouble swallowing or breathing Dry cough, shortness of breath or trouble breathing Heart failure--shortness of breath, swelling of the ankles, feet, or hands, sudden weight gain, unusual weakness or fatigue Infection--fever, chills, cough, or sore throat Infusion reactions--chest pain, shortness of breath or trouble breathing, feeling faint or lightheaded Side effects that usually do not require medical attention (report to your care team if they continue or are bothersome): Diarrhea Dizziness Headache Nausea Trouble sleeping Vomiting This list may not describe all possible side effects. Call your doctor for medical advice about side effects. You may report side effects to FDA at 1-800-FDA-1088. Where should I keep my medication? This medication is given in a hospital or clinic. It will not be stored at home. NOTE: This sheet is a summary. It may not cover all possible information. If you have questions about this medicine, talk to your doctor, pharmacist, or health care provider.    2023 Elsevier/[employer] Standard (2021-11-30 00:00:00)  

## 2022-12-05 ENCOUNTER — Telehealth: Payer: Self-pay

## 2022-12-05 ENCOUNTER — Other Ambulatory Visit: Payer: Self-pay

## 2022-12-05 NOTE — Telephone Encounter (Signed)
Faxed last office visit to [facility] of [place] case Financial controller. Received confirmation that it was received.

## 2022-12-06 ENCOUNTER — Inpatient Hospital Stay: Payer: BC Managed Care – PPO

## 2022-12-20 DIAGNOSIS — R7303 Prediabetes: Secondary | ICD-10-CM | POA: Diagnosis not present

## 2022-12-20 DIAGNOSIS — E559 Vitamin D deficiency, unspecified: Secondary | ICD-10-CM | POA: Diagnosis not present

## 2022-12-20 DIAGNOSIS — Z23 Encounter for immunization: Secondary | ICD-10-CM | POA: Diagnosis not present

## 2022-12-20 DIAGNOSIS — I10 Essential (primary) hypertension: Secondary | ICD-10-CM | POA: Diagnosis not present

## 2022-12-20 DIAGNOSIS — Z0000 Encounter for general adult medical examination without abnormal findings: Secondary | ICD-10-CM | POA: Diagnosis not present

## 2022-12-20 DIAGNOSIS — G620 Drug-induced polyneuropathy: Secondary | ICD-10-CM | POA: Diagnosis not present

## 2022-12-27 ENCOUNTER — Inpatient Hospital Stay: Payer: BC Managed Care – PPO

## 2022-12-27 ENCOUNTER — Inpatient Hospital Stay: Payer: BC Managed Care – PPO | Attending: Hematology | Admitting: Hematology

## 2022-12-27 ENCOUNTER — Encounter: Payer: Self-pay | Admitting: Hematology

## 2022-12-27 VITALS — BP 145/68 | HR 74 | Temp 98.7°F | Resp 15 | Ht 70.0 in | Wt 238.5 lb

## 2022-12-27 DIAGNOSIS — G620 Drug-induced polyneuropathy: Secondary | ICD-10-CM | POA: Diagnosis not present

## 2022-12-27 DIAGNOSIS — C773 Secondary and unspecified malignant neoplasm of axilla and upper limb lymph nodes: Secondary | ICD-10-CM | POA: Diagnosis not present

## 2022-12-27 DIAGNOSIS — C50911 Malignant neoplasm of unspecified site of right female breast: Secondary | ICD-10-CM

## 2022-12-27 DIAGNOSIS — Z171 Estrogen receptor negative status [ER-]: Secondary | ICD-10-CM | POA: Diagnosis not present

## 2022-12-27 DIAGNOSIS — Z5112 Encounter for antineoplastic immunotherapy: Secondary | ICD-10-CM | POA: Insufficient documentation

## 2022-12-27 DIAGNOSIS — Z95828 Presence of other vascular implants and grafts: Secondary | ICD-10-CM

## 2022-12-27 DIAGNOSIS — C7800 Secondary malignant neoplasm of unspecified lung: Secondary | ICD-10-CM | POA: Diagnosis not present

## 2022-12-27 LAB — CBC WITH DIFFERENTIAL (CANCER CENTER ONLY)
Abs Immature Granulocytes: 0.02 10*3/uL (ref 0.00–0.07)
Basophils Absolute: 0 10*3/uL (ref 0.0–0.1)
Basophils Relative: 0 %
Eosinophils Absolute: 0.1 10*3/uL (ref 0.0–0.5)
Eosinophils Relative: 2 %
HCT: 34.6 % — ABNORMAL LOW (ref 36.0–46.0)
Hemoglobin: 11.8 g/dL — ABNORMAL LOW (ref 12.0–15.0)
Immature Granulocytes: 0 %
Lymphocytes Relative: 35 %
Lymphs Abs: 2.6 10*3/uL (ref 0.7–4.0)
MCH: 30.7 pg (ref 26.0–34.0)
MCHC: 34.1 g/dL (ref 30.0–36.0)
MCV: 90.1 fL (ref 80.0–100.0)
Monocytes Absolute: 0.6 10*3/uL (ref 0.1–1.0)
Monocytes Relative: 9 %
Neutro Abs: 4 10*3/uL (ref 1.7–7.7)
Neutrophils Relative %: 54 %
Platelet Count: 225 10*3/uL (ref 150–400)
RBC: 3.84 MIL/uL — ABNORMAL LOW (ref 3.87–5.11)
RDW: 13.5 % (ref 11.5–15.5)
WBC Count: 7.4 10*3/uL (ref 4.0–10.5)
nRBC: 0 % (ref 0.0–0.2)

## 2022-12-27 LAB — CMP (CANCER CENTER ONLY)
ALT: 24 U/L (ref 0–44)
AST: 14 U/L — ABNORMAL LOW (ref 15–41)
Albumin: 4 g/dL (ref 3.5–5.0)
Alkaline Phosphatase: 66 U/L (ref 38–126)
Anion gap: 6 (ref 5–15)
BUN: 11 mg/dL (ref 6–20)
CO2: 28 mmol/L (ref 22–32)
Calcium: 8.7 mg/dL — ABNORMAL LOW (ref 8.9–10.3)
Chloride: 105 mmol/L (ref 98–111)
Creatinine: 0.82 mg/dL (ref 0.44–1.00)
GFR, Estimated: >60 mL/min (ref >60)
Glucose, Bld: 97 mg/dL (ref 70–99)
Potassium: 3.6 mmol/L (ref 3.5–5.1)
Sodium: 139 mmol/L (ref 135–145)
Total Bilirubin: 0.4 mg/dL (ref 0.3–1.2)
Total Protein: 7.6 g/dL (ref 6.5–8.1)

## 2022-12-27 MED ORDER — TRASTUZUMAB-ANNS CHEMO 150 MG IV SOLR
6.0000 mg/kg | Freq: Once | INTRAVENOUS | Status: AC
  Administered 2022-12-27: 600 mg via INTRAVENOUS
  Filled 2022-12-27: qty 28.57

## 2022-12-27 MED ORDER — SODIUM CHLORIDE 0.9 % IV SOLN: Freq: Once | INTRAVENOUS | Status: AC

## 2022-12-27 MED ORDER — HEPARIN SOD (PORK) LOCK FLUSH 100 UNIT/ML IV SOLN
500.0000 [IU] | Freq: Once | INTRAVENOUS | Status: AC | PRN
  Administered 2022-12-27: 500 [IU]

## 2022-12-27 MED ORDER — SODIUM CHLORIDE 0.9% FLUSH
10.0000 mL | Freq: Once | INTRAVENOUS | Status: AC
  Administered 2022-12-27: 10 mL

## 2022-12-27 MED ORDER — SODIUM CHLORIDE 0.9% FLUSH
10.0000 mL | INTRAVENOUS | Status: DC | PRN
  Administered 2022-12-27: 10 mL

## 2022-12-27 NOTE — Progress Notes (Signed)
[facility]   Telephone:(336) [phone] Fax:(336) [phone]   Clinic Follow up Note   Patient Care Team: [name], [name] as PCP - General (Family Medicine) Dorris Singh, DO (Family Medicine) Almond Lint, MD as Consulting Physician (General Surgery) Malachy Mood, MD as Consulting Physician (Hematology) Magda Kiel, MD (Obstetrics and Gynecology) Gala Romney, Bevelyn Buckles, MD as Consulting Physician (Cardiology)  Date of Service:  12/27/2022  CHIEF COMPLAINT: f/u of metastatic breast cancer     CURRENT THERAPY:   Trastuzumab every 21 days, will change to q28d from today    ASSESSMENT:  [name] [name] is a 52 y.o. 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 Her restaging scan has showed NED -last restaging CT CAP 01/15/22 showed NED. Plan to repeat yearly  -She has been on maintenance Herceptin for about 7.5 years.  She is probably cured, although I am not able to approve it. Will continue Herceptin for now, I would consider stopping therapy after 10 years -she continues to tolerate herceptin injections well. Labs reviewed, overall stable. -He reports slightly worsening neuropathy in her feet daily, especially at night and it wakes her up.  I recommended her to take Neurontin at bedtime -Not sure if her neuropathy is exacerbated by trastuzumab, I will change the infusion to every 28 days.  She agreed  Peripheral neuropathy -Secondary to previous chemotherapy, worse lately -Continue Neurontin, I recommend her to take every night before bedtime  PLAN: -recommend taking Gabapentin at HS -change Trastuzumab from q3 weeks to q4 weeks due to increase nueropathy -next CT scan in March 2025 -lab and f/u in 3 months -I messaged her cardiologist Dr. Gala Romney for  her echo and follow-up in October 2024   SUMMARY OF ONCOLOGIC HISTORY: Oncology History Overview Note  Breast cancer 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   Staging form: Breast, AJCC 7th Edition     Clinical stage from 07/22/2014: Stage IV (T3, N1, M1) - Unsigned     Breast cancer 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 (HCC)  07/02/2014 Mammogram   Mammogram showed a 2cm right beast mass and a 1.8cm right axillary node. MRI breast on 07/16/2014 showed 7cm R breast lesion and 4.4cm r axillary node    07/02/2014 Imaging   CT CAP: a 4.7cm mass in LUL lung and a 2.1cm mas in RML, and a small nodule in RUL, suspecious for metastasis     07/09/2014 Initial Diagnosis   right IDA with b/l lung lesions, ER-/PR-/HER2+   07/09/2014 Initial Biopsy   US guided right breast mass and axillary node biopsy showed IDA, and DCIS, ER-/PR-/HER2+   07/26/2014 Pathologic Stage   Left lung mass by IR, path revealed high grade carcinoma, morphology similar to breast tumor biopsy, TTF(-), NapsinA(-), ER(-)   08/04/2014 - 03/22/2015 Chemotherapy   weekly Paclitaxel 80mg /m2, trastuzumab and pertuzumab every 3 weeks   08/30/2014 Genetic Testing   BreastNext panel was negative. 17 genes including BRCA1, BRCA2, were negative for mutations.    10/04/2014 Imaging   Interval decrease in the right axillary lymphadenopathy. Bilateral pulmonary lesions with left hilar lymphadenopathy also markedly decreased in the interval. The left hilar lymphadenopathy has resolved.   12/20/2014 Imaging   restaging CT showed stable disease, no new lesions    03/29/2015 Pathology Results    right breast lumpectomy showed  chemotherapy treatment effect,  a 1 mm  residual tumor,   margins were widely negative, 5 sentinel lymph nodes and 2 axillary lymph nodes were negative.    03/29/2015 Surgery    right breast lumpectomy and sentinel lymph node biopsy  by Dr. Donell Beers   04/12/2015 -  Chemotherapy   Herceptin maintenance therapy , 6 mg/kg, every 3 weeks since 04/12/15.  Switched to Herceptin Hylecta injection q3weeks after 06/10/19.   04/12/2015 - 10/04/2022 Chemotherapy   Patient is on Treatment Plan : BREAST Trastuzumab q21d     05/03/2015 - 06/14/2015 Radiation Therapy   right breast adjuvant irradiation by Dr. Dayton Scrape    08/28/2016 Imaging   CT CAP w Contrast  IMPRESSION: 1. No acute process or evidence of metastatic disease within the chest, abdomen, or pelvis. 2. Similar to less well-defined left upper lobe density, likely scarring. No evidence of new or progressive pulmonary metastasis. 3. Right nephrolithiasis.   01/22/2017 Imaging   CT Chest W Contrast 01/22/17 IMPRESSION: 1. Stable exam. No new or progressive findings. No evidence for metastatic disease. 2. Left upper lobe architectural distortion/scarring is stable. 3. Nonobstructing right renal stone.   07/31/2017 Imaging   Bone Scan Whole Body 07/31/17  IMPRESSION: 1. No scintigraphic evidence of osseous metastatic disease. 2. Thoracolumbar scoliosis.    07/31/2017 Imaging   CT CAP W Contrast 07/31/17 IMPRESSION: 1. No findings of active or recurrent malignancy. 2. Other imaging findings of potential clinical significance: Aortic Atherosclerosis (ICD10-I70.0). Mild cardiomegaly. Postoperative and radiation therapy findings in the right chest. Nonobstructive right nephrolithiasis. Thoracolumbar scoliosis.     01/20/2018 Echocardiogram   ECHO 01/20/2018 Study Conclusions   - Left ventricle: The cavity size was normal. Wall thickness was   normal. Systolic function was normal. The estimated ejection   fraction was in the range of 55% to 60%. Wall motion was normal;   there were no regional wall motion abnormalities. Doppler   parameters are consistent with abnormal left ventricular   relaxation (grade 1 diastolic dysfunction). - Impressions: GLS -20.7% LS&' 10.4 cm.    02/17/2018 Imaging   02/17/2018 CT CAP IMPRESSION: 1. No evidence for residual or recurrent tumor or  metastatic disease. 2.  Aortic Atherosclerosis (ICD10-I70.0). 3. Right renal calculi. 4. Scoliosis.   08/11/2018 Imaging   CT CAP W Contrast 08/11/18  IMPRESSION: No evidence for localized recurrence or metastatic disease in the chest, abdomen or pelvis.   02/23/2019 Imaging   CT CAP W Contrast  IMPRESSION: 1. No findings of active malignancy in the chest, abdomen, or pelvis. 2. 3 mm right kidney lower pole nonobstructive renal calculus. 3. Thoracolumbar scoliosis.   12/08/2019 Imaging   CT CAP W contrast  IMPRESSION: 1. Unchanged post treatment appearance of the lungs. No evidence of recurrent or new metastatic disease in the chest, abdomen, or pelvis.   2. Postoperative findings of right lumpectomy and axillary lymph node dissection.   3.  Nonobstructive right nephrolithiasis.   11/17/2020 Imaging   CT CAP IMPRESSION: 1. No evidence of recurrence or new metastatic disease within the chest, abdomen, or pelvis. 2. Similar post treatment changes in the lungs and postoperative findings of right lumpectomy and axillary lymph node dissection. 3. Colonic diverticulosis without findings of acute diverticulitis.   08/10/2021 Genetic Testing   Negative genetic testing on the CancerNext-Expanded+RNAinsight panel.  The report date is August 10, 2021.  The CancerNext-Expanded gene panel offered by [facility] and includes sequencing and rearrangement analysis for the following 77 genes: AIP, ALK, APC*, ATM*, AXIN2, BAP1, BARD1, BLM, BMPR1A, BRCA1*,  BRCA2*, BRIP1*, CDC73, CDH1*, CDK4, CDKN1B, CDKN2A, CHEK2*, CTNNA1, DICER1, FANCC, FH, FLCN, GALNT12, KIF1B, LZTR1, MAX, MEN1, MET, MLH1*, MSH2*, MSH3, MSH6*, MUTYH*, NBN, NF1*, NF2, NTHL1, PALB2*, PHOX2B, PMS2*, POT1, PRKAR1A, PTCH1, PTEN*, RAD51C*, RAD51D*, RB1, RECQL, RET, SDHA, SDHAF2, SDHB, SDHC, SDHD, SMAD4, SMARCA4, SMARCB1, SMARCE1, STK11, SUFU, TMEM127, TP53*, TSC1, TSC2, VHL and XRCC2 (sequencing and deletion/duplication); EGFR,  EGLN1, HOXB13, KIT, MITF, PDGFRA, POLD1, and POLE (sequencing only); EPCAM and GREM1 (deletion/duplication only). DNA and RNA analyses performed for * genes.    10/25/2022 - 10/25/2022 Chemotherapy   Patient is on Treatment Plan : BREAST MAINTENANCE Trastuzumab IV (6) or SQ (600) D1 q21d X 11 Cycles     10/25/2022 -  Chemotherapy   Patient is on Treatment Plan : BREAST MAINTENANCE Trastuzumab IV (6) or SQ (600) D1 q21d x 13 cycles        INTERVAL HISTORY:  ADYLENE RANDO is here for a follow up of metastatic breast cancer . She was last seen by me on 08/23/2022. She presents to the clinic alone. Pt state that her neuropathy is coming more frequently and its more in her feet. And more at night. She state that her flares up are 2-3 time a week.She takes Gabapentin and use ice packs.  All other systems were reviewed with the patient and are negative.  MEDICAL HISTORY:  Past Medical History:  Diagnosis Date   Breast cancer (HCC)    Breast cancer (HCC)    CHF (congestive heart failure) (HCC)    Family history of breast cancer    Family history of prostate cancer    GERD (gastroesophageal reflux disease)    during pregnancy    Pneumonia    hx of pneumonia 08/2013    S/P radiation therapy 05/03/2015 through 06/14/2015                                                      Right breast 4680 cGy in 26 sessions, right breast boost 1000 cGy in 5 sessions.  Right supraclavicular/axillary region 4680 cGy with a supplemental PA field to bring the axillary dose up to 4500 cGy in 26 sessions    SURGICAL HISTORY: Past Surgical History:  Procedure Laterality Date   BREAST LUMPECTOMY WITH RADIOACTIVE SEED AND SENTINEL LYMPH NODE BIOPSY Right 03/29/2015   Procedure: BREAST LUMPECTOMY WITH RADIOACTIVE SEED AND SENTINEL LYMPH NODE BIOPSY;  Surgeon: Almond Lint, MD;  Location: [facility];  Service: General;  Laterality: Right;   CESAREAN SECTION     CHOLECYSTECTOMY     ESSURE TUBAL LIGATION      PORTACATH PLACEMENT Left 07/21/2014   Procedure: INSERTION PORT-A-CATH;  Surgeon: Almond Lint, MD;  Location: [facility];  Service: General;  Laterality: Left;   WISDOM TOOTH EXTRACTION      I have reviewed the social history and family history with the patient and they are unchanged from previous note.  ALLERGIES:  is allergic to adhesive [tape], aspirin, and codeine.  MEDICATIONS:  Current Outpatient Medications  Medication Sig Dispense Refill   acetaminophen (TYLENOL) 500 MG tablet Take 500 mg by mouth every 6 (six) hours as needed.     Ascorbic Acid (VITAMIN C) 500 MG CHEW      benzonatate (TESSALON) 100 MG capsule Take 1 capsule (100 mg total) by mouth every 8 (eight) hours.  21 capsule 0   Calcium-Phosphorus-Vitamin D (CALCIUM GUMMIES PO) Take by mouth daily.     Camphor-Eucalyptus-Menthol (VICKS VAPORUB EX) Apply 1 application. topically at bedtime. Applies under nose, throat and on chest as needed     cetirizine (ZYRTEC) 10 MG tablet Take 10 mg by mouth daily as needed for allergies (allergies).      Fluocinolone Acetonide Body 0.01 % OIL Apply topically 4 (four) times a week.     fluticasone (FLONASE) 50 MCG/ACT nasal spray Place into both nostrils daily.     gabapentin (NEURONTIN) 100 MG capsule TAKE 2 CAPSULES(200 MG) BY MOUTH AT BEDTIME 60 capsule 3   hydrocortisone 2.5 % cream Apply 1 application topically 2 (two) times daily as needed.     ibuprofen (ADVIL,MOTRIN) 800 MG tablet Take 1 tablet (800 mg total) by mouth every 8 (eight) hours as needed. 60 tablet 1   lidocaine-prilocaine (EMLA) cream APPLY TO THE AFFECTED AREA PRIOR TO CHEMOTHERAPY AS DIRECTED 30 g 2   loperamide (IMODIUM A-D) 2 MG tablet Take 1 tablet (2 mg total) by mouth 4 (four) times daily as needed for diarrhea or loose stools. 30 tablet 0   loperamide (IMODIUM) 2 MG capsule Take 1 capsule (2 mg total) by mouth 4 (four) times daily as needed for diarrhea or loose stools. 12 capsule 0   losartan (COZAAR) 25 MG  tablet TAKE 1 TABLET(25 MG) BY MOUTH DAILY 90 tablet 3   minoxidil (LONITEN) 2.5 MG tablet Take 2.5 mg by mouth daily. 1/2 tab once daily (Patient not taking: Reported on 12/04/2022)     Multiple Vitamins-Minerals (MULTIVITAMIN GUMMIES ADULT PO) Take 1 each by mouth daily. Women's Vitafusion gummie     ondansetron (ZOFRAN-ODT) 4 MG disintegrating tablet Take 1 tablet (4 mg total) by mouth every 8 (eight) hours as needed for nausea or vomiting. 20 tablet 0   spironolactone (ALDACTONE) 25 MG tablet Take 1 tablet (25 mg total) by mouth daily. NEEDS APPOINTMENT FOR ANYMORE REFILLS 30 tablet 0   No current facility-administered medications for this visit.   Facility-Administered Medications Ordered in Other Visits  Medication Dose Route Frequency Provider Last Rate Last Admin   sodium chloride 0.9 % injection 10 mL  10 mL Intravenous PRN Malachy Mood, MD       sodium chloride flush (NS) 0.9 % injection 10 mL  10 mL Intravenous PRN Malachy Mood, MD       sodium chloride flush (NS) 0.9 % injection 10 mL  10 mL Intracatheter PRN Malachy Mood, MD   10 mL at 12/27/22 1625    PHYSICAL EXAMINATION: ECOG PERFORMANCE STATUS: 0 - Asymptomatic  Vitals:   12/27/22 1425  BP: (!) 145/68  Pulse: 74  Resp: 15  Temp: 98.7 F (37.1 C)  SpO2: 100%   Wt Readings from Last 3 Encounters:  12/27/22 238 lb 8 oz (108.2 kg)  12/04/22 232 lb 12 oz (105.6 kg)  11/15/22 234 lb 8 oz (106.4 kg)    NECK:(-) supple, thyroid normal size, non-tender, without nodularity LYMPH:(-)  no palpable lymphadenopathy in the cervical, axillary  LUNGS: (-) clear to auscultation and percussion with normal breathing effort HEART: (-)regular rate & rhythm and no murmurs and no lower extremity edema  LABORATORY DATA:  I have reviewed the data as listed    Latest Ref Rng & Units 12/27/2022    1:58 PM 10/25/2022    3:04 PM 10/17/2022   12:28 PM  CBC  WBC 4.0 - 10.5 K/uL 7.4  7.4  7.7   Hemoglobin 12.0 - 15.0 g/dL 40.9  81.1  91.4    Hematocrit 36.0 - 46.0 % 34.6  36.9  39.8   Platelets 150 - 400 K/uL 225  275  230         Latest Ref Rng & Units 12/27/2022    1:58 PM 10/25/2022    3:04 PM 10/17/2022   12:28 PM  CMP  Glucose 70 - 99 mg/dL 97  79  93   BUN 6 - 20 mg/dL 11  13  13    Creatinine 0.44 - 1.00 mg/dL 7.82  9.56  2.13   Sodium 135 - 145 mmol/L 139  139  132   Potassium 3.5 - 5.1 mmol/L 3.6  3.9  3.6   Chloride 98 - 111 mmol/L 105  104  103   CO2 22 - 32 mmol/L 28  30  24    Calcium 8.9 - 10.3 mg/dL 8.7  9.1  8.4   Total Protein 6.5 - 8.1 g/dL 7.6  7.8  8.6   Total Bilirubin 0.3 - 1.2 mg/dL 0.4  0.4  0.9   Alkaline Phos 38 - 126 U/L 66  61  67   AST 15 - 41 U/L 14  11  21    ALT 0 - 44 U/L 24  21  20        RADIOGRAPHIC STUDIES: I have personally reviewed the radiological images as listed and agreed with the findings in the report. No results found.    No orders of the defined types were placed in this encounter.  All questions were answered. The patient knows to call the clinic with any problems, questions or concerns. No barriers to learning was detected. The total time spent in the appointment was 25 minutes.     Malachy Mood, MD 12/27/2022   Carolin Coy, CMA, am acting as scribe for Malachy Mood, MD.   I have reviewed the above documentation for accuracy and completeness, and I agree with the above.

## 2022-12-27 NOTE — Assessment & Plan Note (Signed)
T3N1M1, stage IV, ER-/PR-/HER2+, ypT68miN0, NED -diagnosed 06/2014 with metastatic disease, s/p neoadjuvant chemo, right lumpectomy, adjuvant radiation and currently on Maintenance Herceptin since 04/12/15. Her restaging scan has showed NED -last restaging CT CAP 01/15/22 showed NED. Plan to repeat yearly  -She has been on maintenance Herceptin for about 7.5 years.  She is probably cured, although I am not able to approve it. Will continue Herceptin for now, I would consider stopping therapy after 10 years -she continues to tolerate herceptin injections well. Labs reviewed, overall stable. -F/u in 4 months

## 2022-12-27 NOTE — Patient Instructions (Signed)
[facility]  Discharge Instructions: Thank you for choosing [facility] to provide your oncology and hematology care.   If you have a lab appointment with the Cancer Center, please go directly to the Cancer Center and check in at the registration area.   Wear comfortable clothing and clothing appropriate for easy access to any Portacath or PICC line.   We strive to give you quality time with your provider. You may need to reschedule your appointment if you arrive late (15 or more minutes).  Arriving late affects you and other patients whose appointments are after yours.  Also, if you miss three or more appointments without notifying the office, you may be dismissed from the clinic at the provider's discretion.      For prescription refill requests, have your pharmacy contact our office and allow 72 hours for refills to be completed.    Today you received the following chemotherapy and/or immunotherapy agents herceptin      To help prevent nausea and vomiting after your treatment, we encourage you to take your nausea medication as directed.  BELOW ARE SYMPTOMS THAT SHOULD BE REPORTED IMMEDIATELY: *FEVER GREATER THAN 100.4 F (38 C) OR HIGHER *CHILLS OR SWEATING *NAUSEA AND VOMITING THAT IS NOT CONTROLLED WITH YOUR NAUSEA MEDICATION *UNUSUAL SHORTNESS OF BREATH *UNUSUAL BRUISING OR BLEEDING *URINARY PROBLEMS (pain or burning when urinating, or frequent urination) *BOWEL PROBLEMS (unusual diarrhea, constipation, pain near the anus) TENDERNESS IN MOUTH AND THROAT WITH OR WITHOUT PRESENCE OF ULCERS (sore throat, sores in mouth, or a toothache) UNUSUAL RASH, SWELLING OR PAIN  UNUSUAL VAGINAL DISCHARGE OR ITCHING   Items with * indicate a potential emergency and should be followed up as soon as possible or go to the Emergency Department if any problems should occur.  Please show the CHEMOTHERAPY ALERT CARD or IMMUNOTHERAPY ALERT CARD at  check-in to the Emergency Department and triage nurse.  Should you have questions after your visit or need to cancel or reschedule your appointment, please contact [facility]  Dept: [phone]  and follow the prompts.  Office hours are 8:00 a.m. to 4:30 p.m. Monday - Friday. Please note that voicemails left after 4:00 p.m. may not be returned until the following business day.  We are closed weekends and major holidays. You have access to a nurse at all times for urgent questions. Please call the main number to the clinic Dept: [phone] and follow the prompts.   For any non-urgent questions, you may also contact your provider using MyChart. We now offer e-Visits for anyone 18 and older to request care online for non-urgent symptoms. For details visit [email or web address].   Also download the MyChart app! Go to the app store, search "MyChart", open the app, select [facility], and log in with your MyChart username and password.   

## 2022-12-28 ENCOUNTER — Other Ambulatory Visit: Payer: Self-pay

## 2022-12-29 ENCOUNTER — Other Ambulatory Visit: Payer: Self-pay

## 2022-12-29 ENCOUNTER — Other Ambulatory Visit: Payer: Self-pay | Admitting: Internal Medicine

## 2023-01-01 ENCOUNTER — Other Ambulatory Visit: Payer: Self-pay

## 2023-01-02 ENCOUNTER — Other Ambulatory Visit (HOSPITAL_COMMUNITY): Payer: Self-pay | Admitting: Cardiology

## 2023-01-02 DIAGNOSIS — C50911 Malignant neoplasm of unspecified site of right female breast: Secondary | ICD-10-CM

## 2023-01-04 ENCOUNTER — Other Ambulatory Visit: Payer: Self-pay

## 2023-01-17 ENCOUNTER — Ambulatory Visit: Payer: BC Managed Care – PPO

## 2023-01-17 DIAGNOSIS — L658 Other specified nonscarring hair loss: Secondary | ICD-10-CM | POA: Diagnosis not present

## 2023-01-17 DIAGNOSIS — L219 Seborrheic dermatitis, unspecified: Secondary | ICD-10-CM | POA: Diagnosis not present

## 2023-01-23 ENCOUNTER — Other Ambulatory Visit: Payer: Self-pay

## 2023-01-23 ENCOUNTER — Inpatient Hospital Stay: Payer: BC Managed Care – PPO | Attending: Hematology

## 2023-01-23 VITALS — BP 124/67 | HR 85 | Temp 99.3°F | Resp 16

## 2023-01-23 DIAGNOSIS — C50911 Malignant neoplasm of unspecified site of right female breast: Secondary | ICD-10-CM | POA: Diagnosis not present

## 2023-01-23 DIAGNOSIS — Z5112 Encounter for antineoplastic immunotherapy: Secondary | ICD-10-CM | POA: Diagnosis not present

## 2023-01-23 MED ORDER — HEPARIN SOD (PORK) LOCK FLUSH 100 UNIT/ML IV SOLN
500.0000 [IU] | Freq: Once | INTRAVENOUS | Status: AC | PRN
  Administered 2023-01-23: 500 [IU]

## 2023-01-23 MED ORDER — SODIUM CHLORIDE 0.9% FLUSH
10.0000 mL | INTRAVENOUS | Status: DC | PRN
  Administered 2023-01-23: 10 mL

## 2023-01-23 MED ORDER — SODIUM CHLORIDE 0.9 % IV SOLN: Freq: Once | INTRAVENOUS | Status: AC

## 2023-01-23 MED ORDER — TRASTUZUMAB-ANNS CHEMO 150 MG IV SOLR
6.0000 mg/kg | Freq: Once | INTRAVENOUS | Status: AC
  Administered 2023-01-23: 600 mg via INTRAVENOUS
  Filled 2023-01-23: qty 28.57

## 2023-01-24 ENCOUNTER — Ambulatory Visit: Payer: BC Managed Care – PPO

## 2023-02-07 DIAGNOSIS — Z124 Encounter for screening for malignant neoplasm of cervix: Secondary | ICD-10-CM | POA: Diagnosis not present

## 2023-02-07 DIAGNOSIS — Z01419 Encounter for gynecological examination (general) (routine) without abnormal findings: Secondary | ICD-10-CM | POA: Diagnosis not present

## 2023-02-07 DIAGNOSIS — Z853 Personal history of malignant neoplasm of breast: Secondary | ICD-10-CM | POA: Diagnosis not present

## 2023-02-20 ENCOUNTER — Other Ambulatory Visit: Payer: Self-pay

## 2023-02-20 ENCOUNTER — Inpatient Hospital Stay: Payer: BC Managed Care – PPO | Attending: Hematology

## 2023-02-20 VITALS — BP 132/82 | HR 90 | Temp 98.9°F | Resp 16 | Wt 238.5 lb

## 2023-02-20 DIAGNOSIS — C50911 Malignant neoplasm of unspecified site of right female breast: Secondary | ICD-10-CM | POA: Diagnosis not present

## 2023-02-20 DIAGNOSIS — Z5112 Encounter for antineoplastic immunotherapy: Secondary | ICD-10-CM | POA: Diagnosis not present

## 2023-02-20 MED ORDER — SODIUM CHLORIDE 0.9% FLUSH
10.0000 mL | INTRAVENOUS | Status: DC | PRN
  Administered 2023-02-20: 10 mL

## 2023-02-20 MED ORDER — TRASTUZUMAB-ANNS CHEMO 150 MG IV SOLR
6.0000 mg/kg | Freq: Once | INTRAVENOUS | Status: AC
  Administered 2023-02-20: 600 mg via INTRAVENOUS
  Filled 2023-02-20: qty 28.57

## 2023-02-20 MED ORDER — SODIUM CHLORIDE 0.9 % IV SOLN: Freq: Once | INTRAVENOUS | Status: AC

## 2023-02-20 MED ORDER — HEPARIN SOD (PORK) LOCK FLUSH 100 UNIT/ML IV SOLN
500.0000 [IU] | Freq: Once | INTRAVENOUS | Status: AC | PRN
  Administered 2023-02-20: 500 [IU]

## 2023-02-20 NOTE — Progress Notes (Signed)
Patient had a concern- she has diarrhea if she doesn't eat in the first two hours she is awake. Dr. Mosetta Putt informed- it does not affect today's treatment- ok to treat. Patient ECHO from 11/15/22- patient every 6 months. Has a scheduled ECHO on 04/23/23.

## 2023-02-20 NOTE — Patient Instructions (Signed)
[facility]  Discharge Instructions: Thank you for choosing [facility] to provide your oncology and hematology care.   If you have a lab appointment with the Cancer Center, please go directly to the Cancer Center and check in at the registration area.   Wear comfortable clothing and clothing appropriate for easy access to any Portacath or PICC line.   We strive to give you quality time with your provider. You may need to reschedule your appointment if you arrive late (15 or more minutes).  Arriving late affects you and other patients whose appointments are after yours.  Also, if you miss three or more appointments without notifying the office, you may be dismissed from the clinic at the provider's discretion.      For prescription refill requests, have your pharmacy contact our office and allow 72 hours for refills to be completed.    Today you received the following chemotherapy and/or immunotherapy agents kanjinti  (trastuzumab)   To help prevent nausea and vomiting after your treatment, we encourage you to take your nausea medication as directed.  BELOW ARE SYMPTOMS THAT SHOULD BE REPORTED IMMEDIATELY: *FEVER GREATER THAN 100.4 F (38 C) OR HIGHER *CHILLS OR SWEATING *NAUSEA AND VOMITING THAT IS NOT CONTROLLED WITH YOUR NAUSEA MEDICATION *UNUSUAL SHORTNESS OF BREATH *UNUSUAL BRUISING OR BLEEDING *URINARY PROBLEMS (pain or burning when urinating, or frequent urination) *BOWEL PROBLEMS (unusual diarrhea, constipation, pain near the anus) TENDERNESS IN MOUTH AND THROAT WITH OR WITHOUT PRESENCE OF ULCERS (sore throat, sores in mouth, or a toothache) UNUSUAL RASH, SWELLING OR PAIN  UNUSUAL VAGINAL DISCHARGE OR ITCHING   Items with * indicate a potential emergency and should be followed up as soon as possible or go to the Emergency Department if any problems should occur.  Please show the CHEMOTHERAPY ALERT CARD or IMMUNOTHERAPY ALERT  CARD at check-in to the Emergency Department and triage nurse.  Should you have questions after your visit or need to cancel or reschedule your appointment, please contact [facility]  Dept: [phone]  and follow the prompts.  Office hours are 8:00 a.m. to 4:30 p.m. Monday - Friday. Please note that voicemails left after 4:00 p.m. may not be returned until the following business day.  We are closed weekends and major holidays. You have access to a nurse at all times for urgent questions. Please call the main number to the clinic Dept: [phone] and follow the prompts.   For any non-urgent questions, you may also contact your provider using MyChart. We now offer e-Visits for anyone 18 and older to request care online for non-urgent symptoms. For details visit [email or web address].   Also download the MyChart app! Go to the app store, search "MyChart", open the app, select [facility], and log in with your MyChart username and password.  Trastuzumab Injection What is this medication? TRASTUZUMAB (tras TOO zoo mab) treats breast cancer and stomach cancer. It works by blocking a protein that causes cancer cells to grow and multiply. This helps to slow or stop the spread of cancer cells. This medicine may be used for other purposes; ask your health care provider or pharmacist if you have questions. COMMON BRAND NAME(S): Herceptin, Herzuma, KANJINTI, Ogivri, Ontruzant, Trazimera What should I tell my care team before I take this medication? They need to know if you have any of these conditions: Heart failure Lung disease An unusual or allergic reaction to trastuzumab, other medications, foods,   dyes, or preservatives Pregnant or trying to get pregnant Breast-feeding How should I use this medication? This medication is injected into a vein. It is given by your care team in a hospital or clinic setting. Talk to your care team about the use of this  medication in children. It is not approved for use in children. Overdosage: If you think you have taken too much of this medicine contact a poison control center or emergency room at once. NOTE: This medicine is only for you. Do not share this medicine with others. What if I miss a dose? Keep appointments for follow-up doses. It is important not to miss your dose. Call your care team if you are unable to keep an appointment. What may interact with this medication? Certain types of chemotherapy, such as daunorubicin, doxorubicin, epirubicin, idarubicin This list may not describe all possible interactions. Give your health care provider a list of all the medicines, herbs, non-prescription drugs, or dietary supplements you use. Also tell them if you smoke, drink alcohol, or use illegal drugs. Some items may interact with your medicine. What should I watch for while using this medication? Your condition will be monitored carefully while you are receiving this medication. This medication may make you feel generally unwell. This is not uncommon, as chemotherapy affects healthy cells as well as cancer cells. Report any side effects. Continue your course of treatment even though you feel ill unless your care team tells you to stop. This medication may increase your risk of getting an infection. Call your care team for advice if you get a fever, chills, sore throat, or other symptoms of a cold or flu. Do not treat yourself. Try to avoid being around people who are sick. Avoid taking medications that contain aspirin, acetaminophen, ibuprofen, naproxen, or ketoprofen unless instructed by your care team. These medications can hide a fever. Talk to your care team if you may be pregnant. Serious birth defects can occur if you take this medication during pregnancy and for 7 months after the last dose. You will need a negative pregnancy test before starting this medication. Contraception is recommended while taking  this medication and for 7 months after the last dose. Your care team can help you find the option that works for you. Do not breastfeed while taking this medication and for 7 months after stopping treatment. What side effects may I notice from receiving this medication? Side effects that you should report to your care team as soon as possible: Allergic reactions or angioedema--skin rash, itching or hives, swelling of the face, eyes, lips, tongue, arms, or legs, trouble swallowing or breathing Dry cough, shortness of breath or trouble breathing Heart failure--shortness of breath, swelling of the ankles, feet, or hands, sudden weight gain, unusual weakness or fatigue Infection--fever, chills, cough, or sore throat Infusion reactions--chest pain, shortness of breath or trouble breathing, feeling faint or lightheaded Side effects that usually do not require medical attention (report to your care team if they continue or are bothersome): Diarrhea Dizziness Headache Nausea Trouble sleeping Vomiting This list may not describe all possible side effects. Call your doctor for medical advice about side effects. You may report side effects to FDA at 1-800-FDA-1088. Where should I keep my medication? This medication is given in a hospital or clinic. It will not be stored at home. NOTE: This sheet is a summary. It may not cover all possible information. If you have questions about this medicine, talk to your doctor, pharmacist, or health care provider.    2023 [employer]vier/Gold Standard (2021-11-30 00:00:00)  

## 2023-03-20 ENCOUNTER — Inpatient Hospital Stay (HOSPITAL_BASED_OUTPATIENT_CLINIC_OR_DEPARTMENT_OTHER): Payer: BC Managed Care – PPO | Admitting: Nurse Practitioner

## 2023-03-20 ENCOUNTER — Inpatient Hospital Stay: Payer: BC Managed Care – PPO | Attending: Hematology

## 2023-03-20 ENCOUNTER — Encounter: Payer: Self-pay | Admitting: Nurse Practitioner

## 2023-03-20 ENCOUNTER — Other Ambulatory Visit: Payer: Self-pay

## 2023-03-20 ENCOUNTER — Inpatient Hospital Stay: Payer: BC Managed Care – PPO

## 2023-03-20 VITALS — BP 143/76 | HR 96 | Temp 98.6°F | Resp 17 | Wt 236.1 lb

## 2023-03-20 DIAGNOSIS — Z800 Family history of malignant neoplasm of digestive organs: Secondary | ICD-10-CM

## 2023-03-20 DIAGNOSIS — R739 Hyperglycemia, unspecified: Secondary | ICD-10-CM

## 2023-03-20 DIAGNOSIS — Z79620 Long term (current) use of immunosuppressive biologic: Secondary | ICD-10-CM | POA: Diagnosis not present

## 2023-03-20 DIAGNOSIS — C7800 Secondary malignant neoplasm of unspecified lung: Secondary | ICD-10-CM | POA: Diagnosis not present

## 2023-03-20 DIAGNOSIS — R5383 Other fatigue: Secondary | ICD-10-CM | POA: Diagnosis not present

## 2023-03-20 DIAGNOSIS — T451X5A Adverse effect of antineoplastic and immunosuppressive drugs, initial encounter: Secondary | ICD-10-CM

## 2023-03-20 DIAGNOSIS — C50911 Malignant neoplasm of unspecified site of right female breast: Secondary | ICD-10-CM

## 2023-03-20 DIAGNOSIS — G620 Drug-induced polyneuropathy: Secondary | ICD-10-CM | POA: Diagnosis not present

## 2023-03-20 DIAGNOSIS — Z5112 Encounter for antineoplastic immunotherapy: Secondary | ICD-10-CM | POA: Diagnosis not present

## 2023-03-20 DIAGNOSIS — Z95828 Presence of other vascular implants and grafts: Secondary | ICD-10-CM

## 2023-03-20 LAB — CMP (CANCER CENTER ONLY)
ALT: 27 U/L (ref 0–44)
AST: 10 U/L — ABNORMAL LOW (ref 15–41)
Albumin: 4.1 g/dL (ref 3.5–5.0)
Alkaline Phosphatase: 76 U/L (ref 38–126)
Anion gap: 7 (ref 5–15)
BUN: 19 mg/dL (ref 6–20)
CO2: 28 mmol/L (ref 22–32)
Calcium: 9.1 mg/dL (ref 8.9–10.3)
Chloride: 103 mmol/L (ref 98–111)
Creatinine: 0.98 mg/dL (ref 0.44–1.00)
GFR, Estimated: >60 mL/min
Glucose, Bld: 163 mg/dL — ABNORMAL HIGH (ref 70–99)
Potassium: 3.5 mmol/L (ref 3.5–5.1)
Sodium: 138 mmol/L (ref 135–145)
Total Bilirubin: 0.4 mg/dL (ref 0.3–1.2)
Total Protein: 8 g/dL (ref 6.5–8.1)

## 2023-03-20 LAB — CBC WITH DIFFERENTIAL (CANCER CENTER ONLY)
Abs Immature Granulocytes: 0.01 10*3/uL (ref 0.00–0.07)
Basophils Absolute: 0 10*3/uL (ref 0.0–0.1)
Basophils Relative: 1 %
Eosinophils Absolute: 0.1 10*3/uL (ref 0.0–0.5)
Eosinophils Relative: 2 %
HCT: 38.3 % (ref 36.0–46.0)
Hemoglobin: 13.2 g/dL (ref 12.0–15.0)
Immature Granulocytes: 0 %
Lymphocytes Relative: 27 %
Lymphs Abs: 2 10*3/uL (ref 0.7–4.0)
MCH: 30.9 pg (ref 26.0–34.0)
MCHC: 34.5 g/dL (ref 30.0–36.0)
MCV: 89.7 fL (ref 80.0–100.0)
Monocytes Absolute: 0.4 10*3/uL (ref 0.1–1.0)
Monocytes Relative: 5 %
Neutro Abs: 4.8 10*3/uL (ref 1.7–7.7)
Neutrophils Relative %: 65 %
Platelet Count: 261 10*3/uL (ref 150–400)
RBC: 4.27 MIL/uL (ref 3.87–5.11)
RDW: 13.2 % (ref 11.5–15.5)
WBC Count: 7.3 10*3/uL (ref 4.0–10.5)
nRBC: 0 % (ref 0.0–0.2)

## 2023-03-20 LAB — HEMOGLOBIN A1C
Hgb A1c MFr Bld: 5.7 % — ABNORMAL HIGH (ref 4.8–5.6)
Mean Plasma Glucose: 116.89 mg/dL

## 2023-03-20 LAB — TSH: TSH: 2.12 u[IU]/mL (ref 0.350–4.500)

## 2023-03-20 LAB — T4, FREE: Free T4: 0.81 ng/dL (ref 0.61–1.12)

## 2023-03-20 MED ORDER — SODIUM CHLORIDE 0.9% FLUSH
10.0000 mL | Freq: Once | INTRAVENOUS | Status: AC
  Administered 2023-03-20: 10 mL

## 2023-03-20 MED ORDER — SODIUM CHLORIDE 0.9% FLUSH
10.0000 mL | INTRAVENOUS | Status: DC | PRN
  Administered 2023-03-20: 10 mL

## 2023-03-20 MED ORDER — TRASTUZUMAB-ANNS CHEMO 150 MG IV SOLR
6.0000 mg/kg | Freq: Once | INTRAVENOUS | Status: AC
  Administered 2023-03-20: 600 mg via INTRAVENOUS
  Filled 2023-03-20: qty 28.57

## 2023-03-20 MED ORDER — HEPARIN SOD (PORK) LOCK FLUSH 100 UNIT/ML IV SOLN
500.0000 [IU] | Freq: Once | INTRAVENOUS | Status: AC | PRN
  Administered 2023-03-20: 500 [IU]

## 2023-03-20 MED ORDER — SODIUM CHLORIDE 0.9 % IV SOLN: Freq: Once | INTRAVENOUS | Status: AC

## 2023-03-20 NOTE — Assessment & Plan Note (Addendum)
Patient was encouraged to take gabapentin 200 mg at bedtime due to neuropathy. -she started this and then stopped after a few weeks due to side effects, especially depression.  -she reports improved neuropathy if more physically active and eating better diet.

## 2023-03-20 NOTE — Progress Notes (Unsigned)
Patient Care Team: [name] [name], [name] as PCP - General (Family Medicine) [name] Singh, DO (Family Medicine) [name] Lint, MD as Consulting Physician (General Surgery) [name] Mood, MD as Consulting Physician (Hematology) [name] Kiel, MD (Obstetrics and Gynecology) [name] [name], [name] Buckles, MD as Consulting Physician (Cardiology)  Clinic Day:  03/20/2023  Referring physician: [name], PA  ASSESSMENT & PLAN:   Assessment & Plan: Breast cancer 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 T3N1M1, stage IV, ER-/PR-/HER2+, [id number], NED -diagnosed 06/2014 with metastatic disease, s/p neoadjuvant chemo, right lumpectomy, adjuvant radiation and currently on Maintenance Herceptin since 04/12/15. Her restaging scan has showed NED -10/17/2022 - CTA chest - negative for PE or thoracic aortic dissection. No acute findings. There is subpleural scarring of anterior right upper lobe. No new nodules or new disease noted.  -10/17/2022 - CT abdomen and pelvis - colonic diverticulosis. Right renal stone without hydronephrosis.  -new restaging CT CAP due 10/2023.  -She has been on maintenance Herceptin for about 7.5 years.  She is probably cured, although I am not able to approve it. Will continue Herceptin for now, I would consider stopping therapy after 10 years -she continues to tolerate herceptin infusions well.  -03/20/2023 - patient complains of moderate to severe fatigue. She is unsure if this fatigue is similar to fatigue she has previously experienced related to chemotherapy treatments, or if this fatigue is worse. It has not been intrusive yet, but she feels like it will become intrusive soon. Increased concern because her aunt, also a breast cancer survivor, was diagnosed with liver cancer at the same time the patient was diagnosed with breast cancer, in 2015. The patient states that her aunt's only complaint was fatigue.  -Labs reviewed, overall stable. CBC normal. Glucose 162 with remainder of CMP normal.  -continue  labs/flush and herceptin infusions every 28 days.  -F/u in 4 months  -reviewed CTA chest and CT abdomen and pelvis with the patient, done 10/2022. There was no evidence of metastatic or new disease noted.  -Will get new CT chest, abdomen, and pelvis for evaluation of metastatic and/or recurrent disease.  -if negative, will try to get echocardiogram done prior to currently scheduled date of 04/23/2023.   Peripheral neuropathy due to chemotherapy [facility]) Patient was encouraged to take gabapentin 200 mg at bedtime due to neuropathy. -she started this and then stopped after a few weeks due to side effects, especially depression.  -she reports improved neuropathy if more physically active and eating better diet.   Other fatigue patient complains of moderate to severe fatigue. She is unsure if this fatigue is similar to fatigue she has previously experienced related to chemotherapy treatments, or if this fatigue is worse. It has not been intrusive yet, but she feels like it will become intrusive soon. Increased concern because her aunt, also a breast cancer survivor, was diagnosed with liver cancer at the same time the patient was diagnosed with breast cancer, in 2015. The patient states that her aunt's only complaint was fatigue.  -will check TSH, T3, and Free T4 today.  --will notify patient and PCP of any abnormal results for further evaluation and treatment.  -reviewed CTA chest and CT abdomen and pelvis with the patient, done 10/2022. There was no evidence of metastatic or new disease noted.  - Will get new CT chest, abdomen, and pelvis for evaluation of metastatic and/or recurrent disease.   Hyperglycemia Most recent HgbA1c done per PCP 12/2022 was 6.0. will check new level today.  -will notify the patient and PCP of  abnormal results for further evaluation and treatment.   Family history of liver cancer patient complains of moderate to severe fatigue. She is unsure if this fatigue is similar to  fatigue she has previously experienced related to chemotherapy treatments, or if this fatigue is worse. It has not been intrusive yet, but she feels like it will become intrusive soon. Increased concern because her aunt, also a breast cancer survivor, was diagnosed with liver cancer at the same time the patient was diagnosed with breast cancer, in 2015. The patient states that her aunt's only complaint was fatigue.  -reviewed CTA chest and CT abdomen and pelvis with the patient, done 10/2022. There was no evidence of metastatic or new disease noted. Will get new CT chest, abdomen, and pelvis for evaluation of metastatic and/or recurrent disease.     Plan: -Reviewed labs. Cbc normal. Cmp showing glucose of 162 with other values essentially normal.  -due to severe fatigue, will check TSH, T3, free T4, and HgbA1c today.  -CT chest, abdomen, pelvis with contrast ordered to evaluate for disease recurrence and/or metastases.  -will get echo sooner than 04/23/2023 if CT remains negative.  -for now, ok to proceed with trastuzumab today.  -repeat flush/labs, follow up, and infusion every 28 days.  -will schedule phone visit with patient after CT and lab results available to discuss.  -patient is agreeable to the above plans.   The patient understands the plans discussed today and is in agreement with them.  She knows to contact our office if she develops concerns prior to her next appointment.  I provided 35 minutes of face-to-face time during this encounter and > 50% was spent counseling as documented under my assessment and plan.    [name] Jews, NP  [facility] [address] [place] [address] Dept: [phone] Dept Fax: [phone]   Orders Placed This Encounter  Procedures   CT Chest W Contrast    Standing Status:   Future    Standing Expiration Date:   03/19/2024    Order Specific Question:   If indicated for the  ordered procedure, I authorize the administration of contrast media per Radiology protocol    Answer:   Yes    Order Specific Question:   Does the patient have a contrast media/X-ray dye allergy?    Answer:   No    Order Specific Question:   Is patient pregnant?    Answer:   No    Order Specific Question:   Preferred imaging location?    Answer:   [facility]   CT ABDOMEN PELVIS W WO CONTRAST    Standing Status:   Future    Standing Expiration Date:   03/19/2024    Order Specific Question:   If indicated for the ordered procedure, I authorize the administration of contrast media per Radiology protocol    Answer:   Yes    Order Specific Question:   Does the patient have a contrast media/X-ray dye allergy?    Answer:   No    Order Specific Question:   Is patient pregnant?    Answer:   No    Order Specific Question:   Preferred imaging location?    Answer:   [facility]    Order Specific Question:   If indicated for the ordered procedure, I authorize the administration of oral contrast media per Radiology protocol    Answer:  Yes   TSH    Standing Status:   Future    Number of Occurrences:   1    Standing Expiration Date:   03/18/2024   T4, free    Standing Status:   Future    Number of Occurrences:   1    Standing Expiration Date:   03/18/2024   T3    Standing Status:   Future    Number of Occurrences:   1    Standing Expiration Date:   03/18/2024   Hemoglobin A1c    Standing Status:   Future    Number of Occurrences:   1    Standing Expiration Date:   03/18/2024      CHIEF COMPLAINT:  CC: right breast cancer with metastases to the lungs   Current Treatment:  Trastuzumab every 28 days   INTERVAL HISTORY:  [name] [name] is here today for repeat clinical assessment. She denies fevers or chills. She denies pain. Her appetite is good. Her weight has been stable.  Right breast cancer with metastases to lungs -trastuzumab infusions now every 28 days.  -labs reviewed  today -plan to continue with trastuzumab infusions for 10 years.  -proceed with trastuzumab infusion today -continue monthly labs/flush, and Trastuzumab infusions  -labs/flush, follow up, and infusion in 4 months.  -CT chest, abdomen, and pelvis with contrast ordered today.  -get echo sooner than 04/23/2023 if CT negative for recurrent/metastatic disease.  -TSH, T3, free T4, and HgbA1c ordered today due to fatigue  -schedule phone visit with patient when labs and imaging results are available.   I have reviewed the past medical history, past surgical history, social history and family history with the patient and they are unchanged from previous note.  ALLERGIES:  is allergic to adhesive [tape], aspirin, and codeine.  MEDICATIONS:  Current Outpatient Medications  Medication Sig Dispense Refill   acetaminophen (TYLENOL) 500 MG tablet Take 500 mg by mouth every 6 (six) hours as needed.     Ascorbic Acid (VITAMIN C) 500 MG CHEW      benzonatate (TESSALON) 100 MG capsule Take 1 capsule (100 mg total) by mouth every 8 (eight) hours. 21 capsule 0   Calcium-Phosphorus-Vitamin D (CALCIUM GUMMIES PO) Take by mouth daily.     Camphor-Eucalyptus-Menthol (VICKS VAPORUB EX) Apply 1 application. topically at bedtime. Applies under nose, throat and on chest as needed     cetirizine (ZYRTEC) 10 MG tablet Take 10 mg by mouth daily as needed for allergies (allergies).      fluticasone (FLONASE) 50 MCG/ACT nasal spray Place into both nostrils daily.     gabapentin (NEURONTIN) 100 MG capsule TAKE 2 CAPSULES(200 MG) BY MOUTH AT BEDTIME 60 capsule 3   hydrocortisone 2.5 % cream Apply 1 application topically 2 (two) times daily as needed.     ibuprofen (ADVIL,MOTRIN) 800 MG tablet Take 1 tablet (800 mg total) by mouth every 8 (eight) hours as needed. 60 tablet 1   lidocaine-prilocaine (EMLA) cream APPLY TO THE AFFECTED AREA PRIOR TO CHEMOTHERAPY AS DIRECTED 30 g 2   loperamide (IMODIUM A-D) 2 MG tablet Take 1  tablet (2 mg total) by mouth 4 (four) times daily as needed for diarrhea or loose stools. 30 tablet 0   loperamide (IMODIUM) 2 MG capsule Take 1 capsule (2 mg total) by mouth 4 (four) times daily as needed for diarrhea or loose stools. 12 capsule 0   losartan (COZAAR) 25 MG tablet TAKE 1 TABLET(25 MG) BY MOUTH DAILY 90 tablet  3   Multiple Vitamins-Minerals (MULTIVITAMIN GUMMIES ADULT PO) Take 1 each by mouth daily. Women's Vitafusion gummie     ondansetron (ZOFRAN-ODT) 4 MG disintegrating tablet Take 1 tablet (4 mg total) by mouth every 8 (eight) hours as needed for nausea or vomiting. 20 tablet 0   spironolactone (ALDACTONE) 25 MG tablet TAKE 1 TABLET(25 MG) BY MOUTH DAILY 30 tablet 0   Fluocinolone Acetonide Body 0.01 % OIL Apply topically 4 (four) times a week.     minoxidil (LONITEN) 2.5 MG tablet Take 2.5 mg by mouth daily. 1/2 tab once daily (Patient not taking: Reported on 12/04/2022)     No current facility-administered medications for this visit.   Facility-Administered Medications Ordered in Other Visits  Medication Dose Route Frequency Provider Last Rate Last Admin   heparin lock flush 100 unit/mL  500 Units Intracatheter Once PRN [name] Mood, MD       sodium chloride 0.9 % injection 10 mL  10 mL Intravenous PRN [name] Mood, MD       sodium chloride flush (NS) 0.9 % injection 10 mL  10 mL Intravenous PRN [name] Mood, MD       sodium chloride flush (NS) 0.9 % injection 10 mL  10 mL Intracatheter PRN [name] Mood, MD       trastuzumab-anns Ernestine Mcmurray) 600 mg in sodium chloride 0.9 % 250 mL chemo infusion  6 mg/kg (Treatment Plan Recorded) Intravenous Once [name] Mood, MD 557.1 mL/hr at 03/20/23 1452 600 mg at 03/20/23 1452    HISTORY OF PRESENT ILLNESS:   Oncology History Overview Note  Breast cancer 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   Staging form: Breast, AJCC 7th Edition     Clinical stage from 07/22/2014: Stage IV (T3, N1, M1) - Unsigned     Breast cancer 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 (HCC)  07/02/2014  Mammogram   Mammogram showed a 2cm right beast mass and a 1.8cm right axillary node. MRI breast on 07/16/2014 showed 7cm R breast lesion and 4.4cm r axillary node    07/02/2014 Imaging   CT CAP: a 4.7cm mass in LUL lung and a 2.1cm mas in RML, and a small nodule in RUL, suspecious for metastasis     07/09/2014 Initial Diagnosis   right IDA with b/l lung lesions, ER-/PR-/HER2+   07/09/2014 Initial Biopsy   US guided right breast mass and axillary node biopsy showed IDA, and DCIS, ER-/PR-/HER2+   07/26/2014 Pathologic Stage   Left lung mass by IR, path revealed high grade carcinoma, morphology similar to breast tumor biopsy, TTF(-), NapsinA(-), ER(-)   08/04/2014 - 03/22/2015 Chemotherapy   weekly Paclitaxel 80mg /m2, trastuzumab and pertuzumab every 3 weeks   08/30/2014 Genetic Testing   BreastNext panel was negative. 17 genes including BRCA1, BRCA2, were negative for mutations.    10/04/2014 Imaging   Interval decrease in the right axillary lymphadenopathy. Bilateral pulmonary lesions with left hilar lymphadenopathy also markedly decreased in the interval. The left hilar lymphadenopathy has resolved.   12/20/2014 Imaging   restaging CT showed stable disease, no new lesions    03/29/2015 Pathology Results    right breast lumpectomy showed  chemotherapy treatment effect,  a 1 mm residual tumor,   margins were widely negative, 5 sentinel lymph nodes and 2 axillary lymph nodes were negative.    03/29/2015 Surgery    right breast lumpectomy and sentinel lymph node biopsy  by Dr. Donell Beers   04/12/2015 -  Chemotherapy   Herceptin maintenance therapy , 6 mg/kg, every 3 weeks since 04/12/15. Switched  to Herceptin Hylecta injection q3weeks after 06/10/19.   04/12/2015 - 10/04/2022 Chemotherapy   Patient is on Treatment Plan : BREAST Trastuzumab q21d     05/03/2015 - 06/14/2015 Radiation Therapy   right breast adjuvant irradiation by Dr. Dayton Scrape    08/28/2016 Imaging   CT CAP w  Contrast  IMPRESSION: 1. No acute process or evidence of metastatic disease within the chest, abdomen, or pelvis. 2. Similar to less well-defined left upper lobe density, likely scarring. No evidence of new or progressive pulmonary metastasis. 3. Right nephrolithiasis.   01/22/2017 Imaging   CT Chest W Contrast 01/22/17 IMPRESSION: 1. Stable exam. No new or progressive findings. No evidence for metastatic disease. 2. Left upper lobe architectural distortion/scarring is stable. 3. Nonobstructing right renal stone.   07/31/2017 Imaging   Bone Scan Whole Body 07/31/17  IMPRESSION: 1. No scintigraphic evidence of osseous metastatic disease. 2. Thoracolumbar scoliosis.    07/31/2017 Imaging   CT CAP W Contrast 07/31/17 IMPRESSION: 1. No findings of active or recurrent malignancy. 2. Other imaging findings of potential clinical significance: Aortic Atherosclerosis (ICD10-I70.0). Mild cardiomegaly. Postoperative and radiation therapy findings in the right chest. Nonobstructive right nephrolithiasis. Thoracolumbar scoliosis.     01/20/2018 Echocardiogram   ECHO 01/20/2018 Study Conclusions   - Left ventricle: The cavity size was normal. Wall thickness was   normal. Systolic function was normal. The estimated ejection   fraction was in the range of 55% to 60%. Wall motion was normal;   there were no regional wall motion abnormalities. Doppler   parameters are consistent with abnormal left ventricular   relaxation (grade 1 diastolic dysfunction). - Impressions: GLS -20.7% LS&' 10.4 cm.    02/17/2018 Imaging   02/17/2018 CT CAP IMPRESSION: 1. No evidence for residual or recurrent tumor or metastatic disease. 2.  Aortic Atherosclerosis (ICD10-I70.0). 3. Right renal calculi. 4. Scoliosis.   08/11/2018 Imaging   CT CAP W Contrast 08/11/18  IMPRESSION: No evidence for localized recurrence or metastatic disease in the chest, abdomen or pelvis.   02/23/2019 Imaging   CT CAP W  Contrast  IMPRESSION: 1. No findings of active malignancy in the chest, abdomen, or pelvis. 2. 3 mm right kidney lower pole nonobstructive renal calculus. 3. Thoracolumbar scoliosis.   12/08/2019 Imaging   CT CAP W contrast  IMPRESSION: 1. Unchanged post treatment appearance of the lungs. No evidence of recurrent or new metastatic disease in the chest, abdomen, or pelvis.   2. Postoperative findings of right lumpectomy and axillary lymph node dissection.   3.  Nonobstructive right nephrolithiasis.   11/17/2020 Imaging   CT CAP IMPRESSION: 1. No evidence of recurrence or new metastatic disease within the chest, abdomen, or pelvis. 2. Similar post treatment changes in the lungs and postoperative findings of right lumpectomy and axillary lymph node dissection. 3. Colonic diverticulosis without findings of acute diverticulitis.   08/10/2021 Genetic Testing   Negative genetic testing on the CancerNext-Expanded+RNAinsight panel.  The report date is August 10, 2021.  The CancerNext-Expanded gene panel offered by [facility] and includes sequencing and rearrangement analysis for the following 77 genes: AIP, ALK, APC*, ATM*, AXIN2, BAP1, BARD1, BLM, BMPR1A, BRCA1*, BRCA2*, BRIP1*, CDC73, CDH1*, CDK4, CDKN1B, CDKN2A, CHEK2*, CTNNA1, DICER1, FANCC, FH, FLCN, GALNT12, KIF1B, LZTR1, MAX, MEN1, MET, MLH1*, MSH2*, MSH3, MSH6*, MUTYH*, NBN, NF1*, NF2, NTHL1, PALB2*, PHOX2B, PMS2*, POT1, PRKAR1A, PTCH1, PTEN*, RAD51C*, RAD51D*, RB1, RECQL, RET, SDHA, SDHAF2, SDHB, SDHC, SDHD, SMAD4, SMARCA4, SMARCB1, SMARCE1, STK11, SUFU, TMEM127, TP53*, TSC1, TSC2, VHL and XRCC2 (sequencing  and deletion/duplication); EGFR, EGLN1, HOXB13, KIT, MITF, PDGFRA, POLD1, and POLE (sequencing only); EPCAM and GREM1 (deletion/duplication only). DNA and RNA analyses performed for * genes.    10/17/2022 Imaging   CTA Chest and CT abdomen and pelvis with contrast  IMPRESSION: 1. Negative for acute PE or thoracic aortic  dissection. 2. No acute findings. 3. Colonic diverticulosis. 4. Right nephrolithiasis without hydronephrosis.   10/25/2022 - 10/25/2022 Chemotherapy   Patient is on Treatment Plan : BREAST MAINTENANCE Trastuzumab IV (6) or SQ (600) D1 q21d X 11 Cycles     10/25/2022 -  Chemotherapy   Patient is on Treatment Plan : BREAST MAINTENANCE Trastuzumab IV (6) or SQ (600) D1 q21d x 13 cycles         REVIEW OF SYSTEMS:   Constitutional: Denies fevers, chills or abnormal weight loss. Reporting moderate to severe fatigue.  Eyes: Denies blurriness of vision Ears, nose, mouth, throat, and face: Denies mucositis or sore throat Respiratory: Denies cough, dyspnea or wheezes Cardiovascular: Denies palpitation, chest discomfort or lower extremity swelling Gastrointestinal:  Denies nausea, heartburn or change in bowel habits Skin: Denies abnormal skin rashes Lymphatics: Denies new lymphadenopathy or easy bruising Neurological:Denies numbness, tingling or new weaknesses Behavioral/Psych: [name] is stable, no new changes  All other systems were reviewed with the patient and are negative.   VITALS:   Today's Vitals   03/20/23 1326  BP: (!) 143/76  Pulse: 96  Resp: 17  Temp: 98.6 F (37 C)  TempSrc: Oral  SpO2: 100%  Weight: 236 lb 1.6 oz (107.1 kg)  PainSc: 0-No pain   Body mass index is 33.88 kg/m.    Wt Readings from Last 3 Encounters:  03/20/23 236 lb 1.6 oz (107.1 kg)  02/20/23 238 lb 8 oz (108.2 kg)  12/27/22 238 lb 8 oz (108.2 kg)    Body mass index is 33.88 kg/m.  Performance status (ECOG): 1 - Symptomatic but completely ambulatory  PHYSICAL EXAM:   Physical Exam Vitals and nursing note reviewed.  Constitutional:      Appearance: Normal appearance. She is well-developed.  HENT:     Head: Normocephalic and atraumatic.     Nose: Nose normal.     Mouth/Throat:     Mouth: Mucous membranes are moist.     Pharynx: Oropharynx is clear.  Eyes:     Extraocular Movements:  Extraocular movements intact.     Conjunctiva/sclera: Conjunctivae normal.     Pupils: Pupils are equal, round, and reactive to light.  Neck:     Vascular: No carotid bruit.  Cardiovascular:     Rate and Rhythm: Normal rate and regular rhythm.     Pulses: Normal pulses.     Heart sounds: Normal heart sounds.  Pulmonary:     Effort: Pulmonary effort is normal.     Breath sounds: Normal breath sounds.  Chest:  Breasts:    Right: No swelling, bleeding, inverted nipple, mass, nipple discharge, skin change or tenderness.     Left: No swelling, bleeding, inverted nipple, mass, nipple discharge, skin change or tenderness.  Abdominal:     Palpations: Abdomen is soft.  Musculoskeletal:        General: Normal range of motion.     Cervical back: Normal range of motion and neck supple.  Lymphadenopathy:     Cervical: No cervical adenopathy.     Upper Body:     Right upper body: No supraclavicular, axillary or pectoral adenopathy.     Left upper body: No supraclavicular,  axillary or pectoral adenopathy.  Skin:    General: Skin is warm and dry.     Capillary Refill: Capillary refill takes less than 2 seconds.  Neurological:     General: No focal deficit present.     Mental Status: She is alert and oriented to person, place, and time.  Psychiatric:        [name] and Affect: [name] normal.        Behavior: Behavior normal.        Thought Content: Thought content normal.        Judgment: Judgment normal.      LABORATORY DATA:  I have reviewed the data as listed    Component Value Date/Time   NA 138 03/20/2023 1250   NA 141 08/01/2017 1305   K 3.5 03/20/2023 1250   K 3.6 08/01/2017 1305   CL 103 03/20/2023 1250   CO2 28 03/20/2023 1250   CO2 27 08/01/2017 1305   GLUCOSE 163 (H) 03/20/2023 1250   GLUCOSE 108 08/01/2017 1305   BUN 19 03/20/2023 1250   BUN 10.5 08/01/2017 1305   CREATININE 0.98 03/20/2023 1250   CREATININE 0.9 08/01/2017 1305   CALCIUM 9.1 03/20/2023 1250   CALCIUM  9.4 08/01/2017 1305   PROT 8.0 03/20/2023 1250   PROT 7.6 08/01/2017 1305   ALBUMIN 4.1 03/20/2023 1250   ALBUMIN 3.6 08/01/2017 1305   AST 10 (L) 03/20/2023 1250   AST 21 08/01/2017 1305   ALT 27 03/20/2023 1250   ALT 26 08/01/2017 1305   ALKPHOS 76 03/20/2023 1250   ALKPHOS 73 08/01/2017 1305   BILITOT 0.4 03/20/2023 1250   BILITOT 0.25 08/01/2017 1305   GFRNONAA >60 03/20/2023 1250   GFRAA >60 03/30/2020 1343    No results found for: "SPEP", "UPEP"  Lab Results  Component Value Date   WBC 7.3 03/20/2023   NEUTROABS 4.8 03/20/2023   HGB 13.2 03/20/2023   HCT 38.3 03/20/2023   MCV 89.7 03/20/2023   PLT 261 03/20/2023    Plan: -trastuzumab infusions now every 28 days.  -labs reviewed today -plan to continue with trastuzumab infusions for 10 years.  -proceed with trastuzumab infusion today -continue monthly labs/flush, and Trastuzumab infusions  -labs/flush, follow up, and infusion in 4 months.   ADDENDUM I have seen the patient, examined her. I agree with the assessment and and plan and have edited the notes.   Charnissa reports moderate fatigue for the past few months, no significant pain, cough, or weight loss.  But that she is concerned about her fatigue, lab reviewed, CBC and CMP are unremarkable.  Will check a TSH, and reach out to her cardiologist to see if we can check her echo sooner.  Will also obtain CT chest, abdomen pelvis with contrast to rule out cancer progression, although I do not have very high suspicion for that.  All questions were answered.  Will continue Herceptin injection.  [name] Mood MD 03/20/2023

## 2023-03-20 NOTE — Assessment & Plan Note (Addendum)
T3N1M1, stage IV, ER-/PR-/HER2+, [id number], NED -diagnosed 06/2014 with metastatic disease, s/p neoadjuvant chemo, right lumpectomy, adjuvant radiation and currently on Maintenance Herceptin since 04/12/15. Her restaging scan has showed NED -10/17/2022 - CTA chest - negative for PE or thoracic aortic dissection. No acute findings. There is subpleural scarring of anterior right upper lobe. No new nodules or new disease noted.  -10/17/2022 - CT abdomen and pelvis - colonic diverticulosis. Right renal stone without hydronephrosis.  -new restaging CT CAP due 10/2023.  -She has been on maintenance Herceptin for about 7.5 years.  She is probably cured, although I am not able to approve it. Will continue Herceptin for now, I would consider stopping therapy after 10 years -she continues to tolerate herceptin infusions well.  -03/20/2023 - patient complains of moderate to severe fatigue. She is unsure if this fatigue is similar to fatigue she has previously experienced related to chemotherapy treatments, or if this fatigue is worse. It has not been intrusive yet, but she feels like it will become intrusive soon. Increased concern because her aunt, also a breast cancer survivor, was diagnosed with liver cancer at the same time the patient was diagnosed with breast cancer, in 2015. The patient states that her aunt's only complaint was fatigue.  -Labs reviewed, overall stable. CBC normal. Glucose 162 with remainder of CMP normal.  -continue labs/flush and herceptin infusions every 28 days.  -F/u in 4 months  -reviewed CTA chest and CT abdomen and pelvis with the patient, done 10/2022. There was no evidence of metastatic or new disease noted.  -Will get new CT chest, abdomen, and pelvis for evaluation of metastatic and/or recurrent disease.  -if negative, will try to get echocardiogram done prior to currently scheduled date of 04/23/2023.

## 2023-03-20 NOTE — Assessment & Plan Note (Signed)
Most recent HgbA1c done per PCP 12/2022 was 6.0. will check new level today.  -will notify the patient and PCP of abnormal results for further evaluation and treatment.

## 2023-03-20 NOTE — Assessment & Plan Note (Addendum)
patient complains of moderate to severe fatigue. She is unsure if this fatigue is similar to fatigue she has previously experienced related to chemotherapy treatments, or if this fatigue is worse. It has not been intrusive yet, but she feels like it will become intrusive soon. Increased concern because her aunt, also a breast cancer survivor, was diagnosed with liver cancer at the same time the patient was diagnosed with breast cancer, in 2015. The patient states that her aunt's only complaint was fatigue.  -will check TSH, T3, and Free T4 today.  --will notify patient and PCP of any abnormal results for further evaluation and treatment.  -reviewed CTA chest and CT abdomen and pelvis with the patient, done 10/2022. There was no evidence of metastatic or new disease noted.  - Will get new CT chest, abdomen, and pelvis for evaluation of metastatic and/or recurrent disease.

## 2023-03-20 NOTE — Assessment & Plan Note (Signed)
patient complains of moderate to severe fatigue. She is unsure if this fatigue is similar to fatigue she has previously experienced related to chemotherapy treatments, or if this fatigue is worse. It has not been intrusive yet, but she feels like it will become intrusive soon. Increased concern because her aunt, also a breast cancer survivor, was diagnosed with liver cancer at the same time the patient was diagnosed with breast cancer, in 2015. The patient states that her aunt's only complaint was fatigue.  -reviewed CTA chest and CT abdomen and pelvis with the patient, done 10/2022. There was no evidence of metastatic or new disease noted. Will get new CT chest, abdomen, and pelvis for evaluation of metastatic and/or recurrent disease.

## 2023-03-20 NOTE — Progress Notes (Signed)
Per Hermenia Fiscal NP, ok to treat with ECHO from 11/12/2022.

## 2023-03-21 ENCOUNTER — Encounter: Payer: Self-pay | Admitting: Nurse Practitioner

## 2023-03-21 ENCOUNTER — Encounter: Payer: Self-pay | Admitting: Hematology

## 2023-03-21 ENCOUNTER — Telehealth: Payer: Self-pay | Admitting: Hematology

## 2023-03-21 NOTE — Progress Notes (Signed)
MyChart message sent to patient -   Your thyroid panel looks fantastic. Your hemoglobin A1c is better this time than when you had it checked in May. Blood sugars are a little elevated, but you are not diabetic. Continue with the diet changes you are making and try to incorporate some regular physical activity into your days.  I have ordered a new CT of your chest, abdomen, and pelvis. You should hear from radiology to schedule this

## 2023-04-01 ENCOUNTER — Other Ambulatory Visit (HOSPITAL_COMMUNITY): Payer: Self-pay

## 2023-04-01 MED ORDER — LOSARTAN POTASSIUM 25 MG PO TABS: 25.0000 mg | ORAL_TABLET | Freq: Every day | ORAL | 1 refills | Status: DC

## 2023-04-03 ENCOUNTER — Ambulatory Visit (HOSPITAL_COMMUNITY)
Admission: RE | Admit: 2023-04-03 | Discharge: 2023-04-03 | Disposition: A | Discharge status: Home / Self Care | Payer: BC Managed Care – PPO | Source: Ambulatory Visit | Attending: Nurse Practitioner | Admitting: Nurse Practitioner

## 2023-04-03 ENCOUNTER — Other Ambulatory Visit: Payer: Self-pay | Admitting: Nurse Practitioner

## 2023-04-03 DIAGNOSIS — C50911 Malignant neoplasm of unspecified site of right female breast: Secondary | ICD-10-CM | POA: Diagnosis not present

## 2023-04-03 DIAGNOSIS — R5383 Other fatigue: Secondary | ICD-10-CM | POA: Diagnosis not present

## 2023-04-03 DIAGNOSIS — G620 Drug-induced polyneuropathy: Secondary | ICD-10-CM

## 2023-04-03 DIAGNOSIS — C7800 Secondary malignant neoplasm of unspecified lung: Secondary | ICD-10-CM | POA: Diagnosis not present

## 2023-04-03 DIAGNOSIS — Z800 Family history of malignant neoplasm of digestive organs: Secondary | ICD-10-CM

## 2023-04-03 DIAGNOSIS — N200 Calculus of kidney: Secondary | ICD-10-CM | POA: Diagnosis not present

## 2023-04-03 DIAGNOSIS — K5730 Diverticulosis of large intestine without perforation or abscess without bleeding: Secondary | ICD-10-CM | POA: Diagnosis not present

## 2023-04-03 DIAGNOSIS — R739 Hyperglycemia, unspecified: Secondary | ICD-10-CM

## 2023-04-03 MED ORDER — IOHEXOL 300 MG/ML  SOLN
100.0000 mL | Freq: Once | INTRAMUSCULAR | Status: AC | PRN
  Administered 2023-04-03: 100 mL via INTRAVENOUS

## 2023-04-03 MED ORDER — HEPARIN SOD (PORK) LOCK FLUSH 100 UNIT/ML IV SOLN
INTRAVENOUS | Status: AC
  Filled 2023-04-03: qty 5

## 2023-04-03 MED ORDER — SODIUM CHLORIDE (PF) 0.9 % IJ SOLN
INTRAMUSCULAR | Status: AC
  Filled 2023-04-03: qty 50

## 2023-04-05 ENCOUNTER — Inpatient Hospital Stay (HOSPITAL_BASED_OUTPATIENT_CLINIC_OR_DEPARTMENT_OTHER): Payer: BC Managed Care – PPO | Admitting: Hematology

## 2023-04-05 ENCOUNTER — Encounter: Payer: Self-pay | Admitting: Hematology

## 2023-04-05 DIAGNOSIS — C50911 Malignant neoplasm of unspecified site of right female breast: Secondary | ICD-10-CM | POA: Diagnosis not present

## 2023-04-05 DIAGNOSIS — C7800 Secondary malignant neoplasm of unspecified lung: Secondary | ICD-10-CM | POA: Diagnosis not present

## 2023-04-05 NOTE — Progress Notes (Signed)
[facility]   Telephone:(336) [phone] Fax:(336) [phone]   Clinic Follow up Note   Patient Care Team: [name], [name] as PCP - General (Family Medicine) Dorris Singh, DO (Family Medicine) Almond Lint, MD as Consulting Physician (General Surgery) Malachy Mood, MD as Consulting Physician (Hematology) Magda Kiel, MD (Obstetrics and Gynecology) Gala Romney, Bevelyn Buckles, MD as Consulting Physician (Cardiology)  Date of Service:  04/05/2023  I connected with [name] [name] on 04/05/23 at 10:00 AM EDT by telephone and verified that I am speaking with the correct person using two identifiers.   I discussed the limitations, risks, security and privacy concerns of performing an evaluation and management service by telephone and the availability of in person appointments. I also discussed with the patient that there may be a patient responsible charge related to this service. The patient expressed understanding and agreed to proceed.   Patient's location:  work  Provider's location:  office   CHIEF COMPLAINT: f/u of metastatic breast cancer     CURRENT THERAPY:   Trastuzumab every 21 days, will change to q28d from today    ASSESSMENT:  [name] [name] is a 52 y.o. 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 Her restaging scan has showed NED -last restaging CT CAP 01/15/22 showed NED. Plan to repeat yearly  -She has been on maintenance Herceptin for about 7.5 years.  She is probably cured, although I am not able to approve it. Will continue Herceptin for now, I would consider stopping therapy after 10 years -she continues to tolerate herceptin injections well. She is on every 4 weeks now -Due to her recent moderate fatigue, we obtained a CT chest, abdomen pelvis  with contrast on April 03, 2023, which was negative for malignancy or is acute or change.  I personally reviewed the scan images and discussed the findings with patient -Will continue trastuzumab maintenance therapy  Peripheral neuropathy -Secondary to previous chemotherapy, worse lately -Continue Neurontin, I recommend her to take every night before bedtime  Fatigue  -Will repeat thyroid function and A1c on recent visit, which were unremarkable -She is scheduled for echocardiogram and see cardiologist in few weeks  PLAN: -Scan and lab reviewed -Continue trastuzumab every 4 weeks -Lab and follow-up in 12 weeks   SUMMARY OF ONCOLOGIC HISTORY: Oncology History Overview Note  Breast cancer 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   Staging form: Breast, AJCC 7th Edition     Clinical stage from 07/22/2014: Stage IV (T3, N1, M1) - Unsigned     Breast cancer 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 (HCC)  07/02/2014 Mammogram   Mammogram showed a 2cm right beast mass and a 1.8cm right axillary node. MRI breast on 07/16/2014 showed 7cm R breast lesion and 4.4cm r axillary node    07/02/2014 Imaging   CT CAP: a 4.7cm mass in LUL lung and a 2.1cm mas in RML, and a small nodule in RUL, suspecious for metastasis     07/09/2014 Initial Diagnosis   right IDA with b/l lung lesions, ER-/PR-/HER2+   07/09/2014 Initial Biopsy   US guided right breast mass and axillary node biopsy showed IDA, and DCIS, ER-/PR-/HER2+   07/26/2014 Pathologic Stage   Left lung mass by IR, path revealed high grade carcinoma, morphology similar to breast tumor biopsy, TTF(-), NapsinA(-), ER(-)   08/04/2014 - 03/22/2015 Chemotherapy   weekly Paclitaxel 80mg /m2, trastuzumab  and pertuzumab every 3 weeks   08/30/2014 Genetic Testing   BreastNext panel was negative. 17 genes including BRCA1, BRCA2, were negative for mutations.    10/04/2014 Imaging   Interval decrease in the right axillary lymphadenopathy. Bilateral pulmonary lesions with left hilar  lymphadenopathy also markedly decreased in the interval. The left hilar lymphadenopathy has resolved.   12/20/2014 Imaging   restaging CT showed stable disease, no new lesions    03/29/2015 Pathology Results    right breast lumpectomy showed  chemotherapy treatment effect,  a 1 mm residual tumor,   margins were widely negative, 5 sentinel lymph nodes and 2 axillary lymph nodes were negative.    03/29/2015 Surgery    right breast lumpectomy and sentinel lymph node biopsy  by Dr. Donell Beers   04/12/2015 -  Chemotherapy   Herceptin maintenance therapy , 6 mg/kg, every 3 weeks since 04/12/15. Switched to Herceptin Hylecta injection q3weeks after 06/10/19.   04/12/2015 - 10/04/2022 Chemotherapy   Patient is on Treatment Plan : BREAST Trastuzumab q21d     05/03/2015 - 06/14/2015 Radiation Therapy   right breast adjuvant irradiation by Dr. Dayton Scrape    08/28/2016 Imaging   CT CAP w Contrast  IMPRESSION: 1. No acute process or evidence of metastatic disease within the chest, abdomen, or pelvis. 2. Similar to less well-defined left upper lobe density, likely scarring. No evidence of new or progressive pulmonary metastasis. 3. Right nephrolithiasis.   01/22/2017 Imaging   CT Chest W Contrast 01/22/17 IMPRESSION: 1. Stable exam. No new or progressive findings. No evidence for metastatic disease. 2. Left upper lobe architectural distortion/scarring is stable. 3. Nonobstructing right renal stone.   07/31/2017 Imaging   Bone Scan Whole Body 07/31/17  IMPRESSION: 1. No scintigraphic evidence of osseous metastatic disease. 2. Thoracolumbar scoliosis.    07/31/2017 Imaging   CT CAP W Contrast 07/31/17 IMPRESSION: 1. No findings of active or recurrent malignancy. 2. Other imaging findings of potential clinical significance: Aortic Atherosclerosis (ICD10-I70.0). Mild cardiomegaly. Postoperative and radiation therapy findings in the right chest. Nonobstructive right nephrolithiasis. Thoracolumbar  scoliosis.     01/20/2018 Echocardiogram   ECHO 01/20/2018 Study Conclusions   - Left ventricle: The cavity size was normal. Wall thickness was   normal. Systolic function was normal. The estimated ejection   fraction was in the range of 55% to 60%. Wall motion was normal;   there were no regional wall motion abnormalities. Doppler   parameters are consistent with abnormal left ventricular   relaxation (grade 1 diastolic dysfunction). - Impressions: GLS -20.7% LS&' 10.4 cm.    02/17/2018 Imaging   02/17/2018 CT CAP IMPRESSION: 1. No evidence for residual or recurrent tumor or metastatic disease. 2.  Aortic Atherosclerosis (ICD10-I70.0). 3. Right renal calculi. 4. Scoliosis.   08/11/2018 Imaging   CT CAP W Contrast 08/11/18  IMPRESSION: No evidence for localized recurrence or metastatic disease in the chest, abdomen or pelvis.   02/23/2019 Imaging   CT CAP W Contrast  IMPRESSION: 1. No findings of active malignancy in the chest, abdomen, or pelvis. 2. 3 mm right kidney lower pole nonobstructive renal calculus. 3. Thoracolumbar scoliosis.   12/08/2019 Imaging   CT CAP W contrast  IMPRESSION: 1. Unchanged post treatment appearance of the lungs. No evidence of recurrent or new metastatic disease in the chest, abdomen, or pelvis.   2. Postoperative findings of right lumpectomy and axillary lymph node dissection.   3.  Nonobstructive right nephrolithiasis.   11/17/2020 Imaging   CT CAP IMPRESSION: 1.  No evidence of recurrence or new metastatic disease within the chest, abdomen, or pelvis. 2. Similar post treatment changes in the lungs and postoperative findings of right lumpectomy and axillary lymph node dissection. 3. Colonic diverticulosis without findings of acute diverticulitis.   08/10/2021 Genetic Testing   Negative genetic testing on the CancerNext-Expanded+RNAinsight panel.  The report date is August 10, 2021.  The CancerNext-Expanded gene panel offered by  [facility] and includes sequencing and rearrangement analysis for the following 77 genes: AIP, ALK, APC*, ATM*, AXIN2, BAP1, BARD1, BLM, BMPR1A, BRCA1*, BRCA2*, BRIP1*, CDC73, CDH1*, CDK4, CDKN1B, CDKN2A, CHEK2*, CTNNA1, DICER1, FANCC, FH, FLCN, GALNT12, KIF1B, LZTR1, MAX, MEN1, MET, MLH1*, MSH2*, MSH3, MSH6*, MUTYH*, NBN, NF1*, NF2, NTHL1, PALB2*, PHOX2B, PMS2*, POT1, PRKAR1A, PTCH1, PTEN*, RAD51C*, RAD51D*, RB1, RECQL, RET, SDHA, SDHAF2, SDHB, SDHC, SDHD, SMAD4, SMARCA4, SMARCB1, SMARCE1, STK11, SUFU, TMEM127, TP53*, TSC1, TSC2, VHL and XRCC2 (sequencing and deletion/duplication); EGFR, EGLN1, HOXB13, KIT, MITF, PDGFRA, POLD1, and POLE (sequencing only); EPCAM and GREM1 (deletion/duplication only). DNA and RNA analyses performed for * genes.    10/17/2022 Imaging   CTA Chest and CT abdomen and pelvis with contrast  IMPRESSION: 1. Negative for acute PE or thoracic aortic dissection. 2. No acute findings. 3. Colonic diverticulosis. 4. Right nephrolithiasis without hydronephrosis.   10/25/2022 - 10/25/2022 Chemotherapy   Patient is on Treatment Plan : BREAST MAINTENANCE Trastuzumab IV (6) or SQ (600) D1 q21d X 11 Cycles     10/25/2022 -  Chemotherapy   Patient is on Treatment Plan : BREAST MAINTENANCE Trastuzumab IV (6) or SQ (600) D1 q21d x 13 cycles        INTERVAL HISTORY:  [name] [name] is scheduled for a phone visit for a follow up of metastatic breast cancer . She was last seen by NP Herbert Seta on March 20, 2023.  Her fatigue is stable, no other new complaints.  All other systems were reviewed with the patient and are negative.  MEDICAL HISTORY:  Past Medical History:  Diagnosis Date   Breast cancer (HCC)    Breast cancer (HCC)    CHF (congestive heart failure) (HCC)    Family history of breast cancer    Family history of prostate cancer    GERD (gastroesophageal reflux disease)    during pregnancy    Pneumonia    hx of pneumonia 08/2013    S/P radiation therapy 05/03/2015  through 06/14/2015                                                      Right breast 4680 cGy in 26 sessions, right breast boost 1000 cGy in 5 sessions.  Right supraclavicular/axillary region 4680 cGy with a supplemental PA field to bring the axillary dose up to 4500 cGy in 26 sessions    SURGICAL HISTORY: Past Surgical History:  Procedure Laterality Date   BREAST LUMPECTOMY WITH RADIOACTIVE SEED AND SENTINEL LYMPH NODE BIOPSY Right 03/29/2015   Procedure: BREAST LUMPECTOMY WITH RADIOACTIVE SEED AND SENTINEL LYMPH NODE BIOPSY;  Surgeon: Almond Lint, MD;  Location: [facility];  Service: General;  Laterality: Right;   CESAREAN SECTION     CHOLECYSTECTOMY     ESSURE TUBAL LIGATION     PORTACATH PLACEMENT Left 07/21/2014   Procedure: INSERTION PORT-A-CATH;  Surgeon: Almond Lint, MD;  Location: [facility];  Service: General;  Laterality: Left;   WISDOM TOOTH EXTRACTION      I have reviewed the social history and family history with the patient and they are unchanged from previous note.  ALLERGIES:  is allergic to adhesive [tape], aspirin, and codeine.  MEDICATIONS:  Current Outpatient Medications  Medication Sig Dispense Refill   acetaminophen (TYLENOL) 500 MG tablet Take 500 mg by mouth every 6 (six) hours as needed.     Ascorbic Acid (VITAMIN C) 500 MG CHEW      benzonatate (TESSALON) 100 MG capsule Take 1 capsule (100 mg total) by mouth every 8 (eight) hours. 21 capsule 0   Calcium-Phosphorus-Vitamin D (CALCIUM GUMMIES PO) Take by mouth daily.     Camphor-Eucalyptus-Menthol (VICKS VAPORUB EX) Apply 1 application. topically at bedtime. Applies under nose, throat and on chest as needed     cetirizine (ZYRTEC) 10 MG tablet Take 10 mg by mouth daily as needed for allergies (allergies).      Fluocinolone Acetonide Body 0.01 % OIL Apply topically 4 (four) times a week.     fluticasone (FLONASE) 50 MCG/ACT nasal spray Place into both nostrils daily.     hydrocortisone 2.5 % cream  Apply 1 application topically 2 (two) times daily as needed.     ibuprofen (ADVIL,MOTRIN) 800 MG tablet Take 1 tablet (800 mg total) by mouth every 8 (eight) hours as needed. 60 tablet 1   lidocaine-prilocaine (EMLA) cream APPLY TO THE AFFECTED AREA PRIOR TO CHEMOTHERAPY AS DIRECTED 30 g 2   loperamide (IMODIUM A-D) 2 MG tablet Take 1 tablet (2 mg total) by mouth 4 (four) times daily as needed for diarrhea or loose stools. 30 tablet 0   loperamide (IMODIUM) 2 MG capsule Take 1 capsule (2 mg total) by mouth 4 (four) times daily as needed for diarrhea or loose stools. 12 capsule 0   losartan (COZAAR) 25 MG tablet Take 1 tablet (25 mg total) by mouth daily. 90 tablet 1   minoxidil (LONITEN) 2.5 MG tablet Take 2.5 mg by mouth daily. 1/2 tab once daily (Patient not taking: Reported on 12/04/2022)     Multiple Vitamins-Minerals (MULTIVITAMIN GUMMIES ADULT PO) Take 1 each by mouth daily. Women's Vitafusion gummie     ondansetron (ZOFRAN-ODT) 4 MG disintegrating tablet Take 1 tablet (4 mg total) by mouth every 8 (eight) hours as needed for nausea or vomiting. 20 tablet 0   spironolactone (ALDACTONE) 25 MG tablet TAKE 1 TABLET(25 MG) BY MOUTH DAILY 30 tablet 0   No current facility-administered medications for this visit.   Facility-Administered Medications Ordered in Other Visits  Medication Dose Route Frequency Provider Last Rate Last Admin   sodium chloride 0.9 % injection 10 mL  10 mL Intravenous PRN Malachy Mood, MD       sodium chloride flush (NS) 0.9 % injection 10 mL  10 mL Intravenous PRN Malachy Mood, MD        PHYSICAL EXAMINATION: ECOG PERFORMANCE STATUS: 0 - Asymptomatic  There were no vitals filed for this visit.  Wt Readings from Last 3 Encounters:  03/20/23 236 lb 1.6 oz (107.1 kg)  02/20/23 238 lb 8 oz (108.2 kg)  12/27/22 238 lb 8 oz (108.2 kg)    NECK:(-) supple, thyroid normal size, non-tender, without nodularity LYMPH:(-)  no palpable lymphadenopathy in the cervical, axillary   LUNGS: (-) clear to auscultation and percussion with normal breathing effort HEART: (-)regular rate & rhythm and no murmurs and no lower extremity edema  LABORATORY DATA:  I have reviewed  the data as listed    Latest Ref Rng & Units 03/20/2023   12:50 PM 12/27/2022    1:58 PM 10/25/2022    3:04 PM  CBC  WBC 4.0 - 10.5 K/uL 7.3  7.4  7.4   Hemoglobin 12.0 - 15.0 g/dL 16.1  09.6  04.5   Hematocrit 36.0 - 46.0 % 38.3  34.6  36.9   Platelets 150 - 400 K/uL 261  225  275         Latest Ref Rng & Units 03/20/2023   12:50 PM 12/27/2022    1:58 PM 10/25/2022    3:04 PM  CMP  Glucose 70 - 99 mg/dL 409  97  79   BUN 6 - 20 mg/dL 19  11  13    Creatinine 0.44 - 1.00 mg/dL 8.11  9.14  7.82   Sodium 135 - 145 mmol/L 138  139  139   Potassium 3.5 - 5.1 mmol/L 3.5  3.6  3.9   Chloride 98 - 111 mmol/L 103  105  104   CO2 22 - 32 mmol/L 28  28  30    Calcium 8.9 - 10.3 mg/dL 9.1  8.7  9.1   Total Protein 6.5 - 8.1 g/dL 8.0  7.6  7.8   Total Bilirubin 0.3 - 1.2 mg/dL 0.4  0.4  0.4   Alkaline Phos 38 - 126 U/L 76  66  61   AST 15 - 41 U/L 10  14  11    ALT 0 - 44 U/L 27  24  21        RADIOGRAPHIC STUDIES: I have personally reviewed the radiological images as listed and agreed with the findings in the report. CT CHEST ABDOMEN PELVIS W CONTRAST  Result Date: 04/04/2023 CLINICAL DATA:  Metastatic right breast cancer * Tracking Code: BO * EXAM: CT CHEST, ABDOMEN, AND PELVIS WITH CONTRAST TECHNIQUE: Multidetector CT imaging of the chest, abdomen and pelvis was performed following the standard protocol during bolus administration of intravenous contrast. RADIATION DOSE REDUCTION: This exam was performed according to the departmental dose-optimization program which includes automated exposure control, adjustment of the mA and/or kV according to patient size and/or use of iterative reconstruction technique. CONTRAST:  OMNIPAQUE IOHEXOL 300 MG/ML  SOLN COMPARISON:  Multiple exams, including 10/17/2022  FINDINGS: CT CHEST FINDINGS Cardiovascular: Left Port-A-Cath tip: SVC. Mild cardiomegaly. Mediastinum/Nodes: Prior right axillary dissection. No pathologic adenopathy. Lungs/Pleura: Subpleural density on the right likely from prior radiation therapy. Scarring in the left upper lobe. No new or specifically worrisome nodule identified. Musculoskeletal: Old left seventh healed rib fracture. Dextroconvex lower thoracic scoliosis. Clustered surgical clips in the right medial breast unchanged from prior likely from lumpectomy. CT ABDOMEN PELVIS FINDINGS Hepatobiliary: Cholecystectomy.  Otherwise unremarkable. Pancreas: Unremarkable Spleen: Unremarkable Adrenals/Urinary Tract: Two punctate nonobstructive right renal calculi. Otherwise unremarkable. Stomach/Bowel: Scattered diverticula of the descending and sigmoid colon. Vascular/Lymphatic: Unremarkable Reproductive: Bilateral Essure devices.  Otherwise unremarkable. Other: No supplemental non-categorized findings. Musculoskeletal: Mild levoconvex lumbar scoliosis. IMPRESSION: 1. No findings of active malignancy. 2. Prior right lumpectomy and right axillary dissection. 3. Mild cardiomegaly. 4. Two punctate nonobstructive right renal calculi. 5. Scattered diverticula of the descending and sigmoid colon. 6. Scoliosis. Electronically Signed   By: Gaylyn Rong M.D.   On: 04/04/2023 11:57      No orders of the defined types were placed in this encounter.  I discussed the assessment and treatment plan with the patient. The patient was provided an opportunity to ask questions  and all were answered. The patient agreed with the plan and demonstrated an understanding of the instructions.   The patient was advised to call back or seek an in-person evaluation if the symptoms worsen or if the condition fails to improve as anticipated.  I provided 12 minutes of non face-to-face telephone visit time during this encounter, and > 50% was spent counseling as documented under  my assessment & plan.      Malachy Mood, MD 04/05/2023

## 2023-04-08 ENCOUNTER — Telehealth: Payer: Self-pay | Admitting: Hematology

## 2023-04-09 ENCOUNTER — Other Ambulatory Visit: Payer: Self-pay

## 2023-04-12 ENCOUNTER — Encounter: Payer: Self-pay | Admitting: Nurse Practitioner

## 2023-04-16 ENCOUNTER — Inpatient Hospital Stay: Payer: BC Managed Care – PPO | Attending: Hematology

## 2023-04-16 VITALS — BP 140/68 | HR 78 | Temp 98.4°F | Resp 16 | Wt 239.0 lb

## 2023-04-16 DIAGNOSIS — C7800 Secondary malignant neoplasm of unspecified lung: Secondary | ICD-10-CM | POA: Insufficient documentation

## 2023-04-16 DIAGNOSIS — C50911 Malignant neoplasm of unspecified site of right female breast: Secondary | ICD-10-CM | POA: Diagnosis not present

## 2023-04-16 DIAGNOSIS — Z5112 Encounter for antineoplastic immunotherapy: Secondary | ICD-10-CM | POA: Diagnosis not present

## 2023-04-16 DIAGNOSIS — Z171 Estrogen receptor negative status [ER-]: Secondary | ICD-10-CM | POA: Diagnosis not present

## 2023-04-16 MED ORDER — SODIUM CHLORIDE 0.9% FLUSH
10.0000 mL | INTRAVENOUS | Status: DC | PRN
  Administered 2023-04-16: 10 mL

## 2023-04-16 MED ORDER — HEPARIN SOD (PORK) LOCK FLUSH 100 UNIT/ML IV SOLN
500.0000 [IU] | Freq: Once | INTRAVENOUS | Status: AC | PRN
  Administered 2023-04-16: 500 [IU]

## 2023-04-16 MED ORDER — TRASTUZUMAB-ANNS CHEMO 150 MG IV SOLR
6.0000 mg/kg | Freq: Once | INTRAVENOUS | Status: AC
  Administered 2023-04-16: 600 mg via INTRAVENOUS
  Filled 2023-04-16: qty 28.57

## 2023-04-16 MED ORDER — SODIUM CHLORIDE 0.9 % IV SOLN: Freq: Once | INTRAVENOUS | Status: AC

## 2023-04-16 NOTE — Patient Instructions (Signed)
[facility]  Discharge Instructions: Thank you for choosing [facility] to provide your oncology and hematology care.   If you have a lab appointment with the Cancer Center, please go directly to the Cancer Center and check in at the registration area.   Wear comfortable clothing and clothing appropriate for easy access to any Portacath or PICC line.   We strive to give you quality time with your provider. You may need to reschedule your appointment if you arrive late (15 or more minutes).  Arriving late affects you and other patients whose appointments are after yours.  Also, if you miss three or more appointments without notifying the office, you may be dismissed from the clinic at the provider's discretion.      For prescription refill requests, have your pharmacy contact our office and allow 72 hours for refills to be completed.    Today you received the following chemotherapy and/or immunotherapy agents kanjinti  (trastuzumab)   To help prevent nausea and vomiting after your treatment, we encourage you to take your nausea medication as directed.  BELOW ARE SYMPTOMS THAT SHOULD BE REPORTED IMMEDIATELY: *FEVER GREATER THAN 100.4 F (38 C) OR HIGHER *CHILLS OR SWEATING *NAUSEA AND VOMITING THAT IS NOT CONTROLLED WITH YOUR NAUSEA MEDICATION *UNUSUAL SHORTNESS OF BREATH *UNUSUAL BRUISING OR BLEEDING *URINARY PROBLEMS (pain or burning when urinating, or frequent urination) *BOWEL PROBLEMS (unusual diarrhea, constipation, pain near the anus) TENDERNESS IN MOUTH AND THROAT WITH OR WITHOUT PRESENCE OF ULCERS (sore throat, sores in mouth, or a toothache) UNUSUAL RASH, SWELLING OR PAIN  UNUSUAL VAGINAL DISCHARGE OR ITCHING   Items with * indicate a potential emergency and should be followed up as soon as possible or go to the Emergency Department if any problems should occur.  Please show the CHEMOTHERAPY ALERT CARD or IMMUNOTHERAPY ALERT  CARD at check-in to the Emergency Department and triage nurse.  Should you have questions after your visit or need to cancel or reschedule your appointment, please contact [facility]  Dept: [phone]  and follow the prompts.  Office hours are 8:00 a.m. to 4:30 p.m. Monday - Friday. Please note that voicemails left after 4:00 p.m. may not be returned until the following business day.  We are closed weekends and major holidays. You have access to a nurse at all times for urgent questions. Please call the main number to the clinic Dept: [phone] and follow the prompts.   For any non-urgent questions, you may also contact your provider using MyChart. We now offer e-Visits for anyone 18 and older to request care online for non-urgent symptoms. For details visit [email or web address].   Also download the MyChart app! Go to the app store, search "MyChart", open the app, select [facility], and log in with your MyChart username and password.  Trastuzumab Injection What is this medication? TRASTUZUMAB (tras TOO zoo mab) treats breast cancer and stomach cancer. It works by blocking a protein that causes cancer cells to grow and multiply. This helps to slow or stop the spread of cancer cells. This medicine may be used for other purposes; ask your health care provider or pharmacist if you have questions. COMMON BRAND NAME(S): Herceptin, Herzuma, KANJINTI, Ogivri, Ontruzant, Trazimera What should I tell my care team before I take this medication? They need to know if you have any of these conditions: Heart failure Lung disease An unusual or allergic reaction to trastuzumab, other medications, foods,   dyes, or preservatives Pregnant or trying to get pregnant Breast-feeding How should I use this medication? This medication is injected into a vein. It is given by your care team in a hospital or clinic setting. Talk to your care team about the use of this  medication in children. It is not approved for use in children. Overdosage: If you think you have taken too much of this medicine contact a poison control center or emergency room at once. NOTE: This medicine is only for you. Do not share this medicine with others. What if I miss a dose? Keep appointments for follow-up doses. It is important not to miss your dose. Call your care team if you are unable to keep an appointment. What may interact with this medication? Certain types of chemotherapy, such as daunorubicin, doxorubicin, epirubicin, idarubicin This list may not describe all possible interactions. Give your health care provider a list of all the medicines, herbs, non-prescription drugs, or dietary supplements you use. Also tell them if you smoke, drink alcohol, or use illegal drugs. Some items may interact with your medicine. What should I watch for while using this medication? Your condition will be monitored carefully while you are receiving this medication. This medication may make you feel generally unwell. This is not uncommon, as chemotherapy affects healthy cells as well as cancer cells. Report any side effects. Continue your course of treatment even though you feel ill unless your care team tells you to stop. This medication may increase your risk of getting an infection. Call your care team for advice if you get a fever, chills, sore throat, or other symptoms of a cold or flu. Do not treat yourself. Try to avoid being around people who are sick. Avoid taking medications that contain aspirin, acetaminophen, ibuprofen, naproxen, or ketoprofen unless instructed by your care team. These medications can hide a fever. Talk to your care team if you may be pregnant. Serious birth defects can occur if you take this medication during pregnancy and for 7 months after the last dose. You will need a negative pregnancy test before starting this medication. Contraception is recommended while taking  this medication and for 7 months after the last dose. Your care team can help you find the option that works for you. Do not breastfeed while taking this medication and for 7 months after stopping treatment. What side effects may I notice from receiving this medication? Side effects that you should report to your care team as soon as possible: Allergic reactions or angioedema--skin rash, itching or hives, swelling of the face, eyes, lips, tongue, arms, or legs, trouble swallowing or breathing Dry cough, shortness of breath or trouble breathing Heart failure--shortness of breath, swelling of the ankles, feet, or hands, sudden weight gain, unusual weakness or fatigue Infection--fever, chills, cough, or sore throat Infusion reactions--chest pain, shortness of breath or trouble breathing, feeling faint or lightheaded Side effects that usually do not require medical attention (report to your care team if they continue or are bothersome): Diarrhea Dizziness Headache Nausea Trouble sleeping Vomiting This list may not describe all possible side effects. Call your doctor for medical advice about side effects. You may report side effects to FDA at 1-800-FDA-1088. Where should I keep my medication? This medication is given in a hospital or clinic. It will not be stored at home. NOTE: This sheet is a summary. It may not cover all possible information. If you have questions about this medicine, talk to your doctor, pharmacist, or health care provider.    2023 Elsevier/[employer] Standard (2021-11-30 00:00:00)  

## 2023-04-17 ENCOUNTER — Ambulatory Visit: Payer: BC Managed Care – PPO

## 2023-04-18 ENCOUNTER — Telehealth: Payer: Self-pay

## 2023-04-18 NOTE — Telephone Encounter (Signed)
Left voicemail informing patient documents from [employer] [place] had been completed and faxed to company. Fax conformation received. Copy of documents mailed to patient per request

## 2023-04-23 ENCOUNTER — Ambulatory Visit (HOSPITAL_BASED_OUTPATIENT_CLINIC_OR_DEPARTMENT_OTHER)
Admission: RE | Admit: 2023-04-23 | Discharge: 2023-04-23 | Disposition: A | Discharge status: Home / Self Care | Payer: BC Managed Care – PPO | Source: Ambulatory Visit | Attending: Internal Medicine | Admitting: Internal Medicine

## 2023-04-23 ENCOUNTER — Ambulatory Visit (HOSPITAL_COMMUNITY)
Admission: RE | Admit: 2023-04-23 | Discharge: 2023-04-23 | Disposition: A | Discharge status: Home / Self Care | Payer: BC Managed Care – PPO | Source: Ambulatory Visit | Attending: Internal Medicine | Admitting: Internal Medicine

## 2023-04-23 ENCOUNTER — Encounter (HOSPITAL_COMMUNITY): Payer: Self-pay | Admitting: Internal Medicine

## 2023-04-23 VITALS — BP 124/70 | HR 72 | Wt 241.2 lb

## 2023-04-23 DIAGNOSIS — I509 Heart failure, unspecified: Secondary | ICD-10-CM | POA: Diagnosis not present

## 2023-04-23 DIAGNOSIS — I110 Hypertensive heart disease with heart failure: Secondary | ICD-10-CM | POA: Diagnosis not present

## 2023-04-23 DIAGNOSIS — C50919 Malignant neoplasm of unspecified site of unspecified female breast: Secondary | ICD-10-CM | POA: Diagnosis not present

## 2023-04-23 DIAGNOSIS — I10 Essential (primary) hypertension: Secondary | ICD-10-CM

## 2023-04-23 DIAGNOSIS — Z0189 Encounter for other specified special examinations: Secondary | ICD-10-CM | POA: Diagnosis not present

## 2023-04-23 DIAGNOSIS — Z171 Estrogen receptor negative status [ER-]: Secondary | ICD-10-CM | POA: Diagnosis not present

## 2023-04-23 DIAGNOSIS — C50911 Malignant neoplasm of unspecified site of right female breast: Secondary | ICD-10-CM

## 2023-04-23 DIAGNOSIS — C7800 Secondary malignant neoplasm of unspecified lung: Secondary | ICD-10-CM | POA: Diagnosis not present

## 2023-04-23 LAB — ECHOCARDIOGRAM COMPLETE
Area-P 1/2: 3.3 cm2
S' Lateral: 2.8 cm

## 2023-04-23 NOTE — Patient Instructions (Signed)
Great to see you today!!!  Your physician recommends that you schedule a follow-up appointment in: 6 months with echocardiogram  If you have any questions or concerns before your next appointment please send [place] a message through [place] or call our office at [phone].    TO LEAVE A MESSAGE FOR THE NURSE SELECT OPTION 2, PLEASE LEAVE A MESSAGE INCLUDING: YOUR NAME DATE OF BIRTH CALL BACK NUMBER REASON FOR CALL**this is important as we prioritize the call backs  YOU WILL RECEIVE A CALL BACK THE SAME DAY AS LONG AS YOU CALL BEFORE 4:00 PM

## 2023-04-23 NOTE — Progress Notes (Signed)
Cardio-Oncology Clinic Note   Date:  04/23/2023   ID:  [name] [name], DOB January 16, 1971, MRN [id number]  Location: Home  Provider location: [facility] Type of Visit: Established patient  PCP:  [name], PA  Cardiologist:  None Primary HF: Shatima Zalar  Chief Complaint: Cardio-oncology follow-up   History of Present Illness:  [name] [name] is a 52 y/o woman with Stage IV breast CA with lung mets referred by Dr. Mosetta Putt for enrollment into the cardio-oncology clinic for surveillance while receiving Herceptin (started 04/12/15 and on indefinitiely) who presents via audio/video conferencing for a telehealth visit today.      Continues to receive herceptin for metastatic breast CA (has been receiving Herceptin since 8/16. Last CT scan on 12/08/19 showed lungs remain clear). Works for [employer] for [employer]. Doing well. No SOB, edema, orthopnea or PND.    Echo today 04/23/23 EF 60-65% Personally reviewed  Echo 4/24 EF 60-65% RV ok. GLS -24.9% Echo 04/11/20 EF 60-65% GLS -22.5%  Studies:  Echo 09/21/19 EF 60-65% GLS -21.9% Echo 03/02/19  EF 55-60% GLS -18.2%  Echo 12/19 60-65% GLS -22.2%  Echo 01/20/18. EF 55-60% LS' 10.4 cm/s GLS -20.7%  Echo 06/20/17 EF 55-60% LS' not measured will  GLS -19.1    Past Medical History:  Diagnosis Date   Breast cancer (HCC)    Breast cancer (HCC)    CHF (congestive heart failure) (HCC)    Family history of breast cancer    Family history of prostate cancer    GERD (gastroesophageal reflux disease)    during pregnancy    Pneumonia    hx of pneumonia 08/2013    S/P radiation therapy 05/03/2015 through 06/14/2015                                                      Right breast 4680 cGy in 26 sessions, right breast boost 1000 cGy in 5 sessions.  Right supraclavicular/axillary region 4680 cGy with a supplemental PA field to bring the axillary dose up to 4500 cGy in 26 sessions   Past Surgical History:   Procedure Laterality Date   BREAST LUMPECTOMY WITH RADIOACTIVE SEED AND SENTINEL LYMPH NODE BIOPSY Right 03/29/2015   Procedure: BREAST LUMPECTOMY WITH RADIOACTIVE SEED AND SENTINEL LYMPH NODE BIOPSY;  Surgeon: Almond Lint, MD;  Location: [facility];  Service: General;  Laterality: Right;   CESAREAN SECTION     CHOLECYSTECTOMY     ESSURE TUBAL LIGATION     PORTACATH PLACEMENT Left 07/21/2014   Procedure: INSERTION PORT-A-CATH;  Surgeon: Almond Lint, MD;  Location: [facility];  Service: General;  Laterality: Left;   WISDOM TOOTH EXTRACTION       Current Outpatient Medications  Medication Sig Dispense Refill   acetaminophen (TYLENOL) 500 MG tablet Take 500 mg by mouth every 6 (six) hours as needed.     Ascorbic Acid (VITAMIN C) 500 MG CHEW      Calcium-Phosphorus-Vitamin D (CALCIUM GUMMIES PO) Take by mouth daily.     Camphor-Eucalyptus-Menthol (VICKS VAPORUB EX) Apply 1 application. topically at bedtime. Applies under nose, throat and on chest as needed     cetirizine (ZYRTEC) 10 MG tablet Take 10 mg by mouth daily as needed for allergies (allergies).      fluticasone (FLONASE) 50  MCG/ACT nasal spray Place into both nostrils daily.     hydrocortisone 2.5 % cream Apply 1 application topically 2 (two) times daily as needed.     ibuprofen (ADVIL,MOTRIN) 800 MG tablet Take 1 tablet (800 mg total) by mouth every 8 (eight) hours as needed. 60 tablet 1   lidocaine-prilocaine (EMLA) cream APPLY TO THE AFFECTED AREA PRIOR TO CHEMOTHERAPY AS DIRECTED 30 g 2   loperamide (IMODIUM) 2 MG capsule Take 1 capsule (2 mg total) by mouth 4 (four) times daily as needed for diarrhea or loose stools. 12 capsule 0   losartan (COZAAR) 25 MG tablet Take 1 tablet (25 mg total) by mouth daily. 90 tablet 1   Multiple Vitamins-Minerals (MULTIVITAMIN GUMMIES ADULT PO) Take 1 each by mouth daily. Women's Vitafusion gummie     spironolactone (ALDACTONE) 25 MG tablet TAKE 1 TABLET(25 MG) BY MOUTH DAILY 30  tablet 0   No current facility-administered medications for this encounter.   Facility-Administered Medications Ordered in Other Encounters  Medication Dose Route Frequency Provider Last Rate Last Admin   sodium chloride 0.9 % injection 10 mL  10 mL Intravenous PRN Malachy Mood, MD       sodium chloride flush (NS) 0.9 % injection 10 mL  10 mL Intravenous PRN Malachy Mood, MD        Allergies:   Adhesive [tape], Aspirin, and Codeine   Social History:  The patient  reports that she has never smoked. She has never used smokeless tobacco. She reports current alcohol use. She reports that she does not use drugs.   Family History:  The patient's family history includes Breast cancer (age of onset: 43) in her maternal aunt; Breast cancer (age of onset: 42) in her mother; Breast cancer (age of onset: 48) in her cousin; Breast cancer (age of onset: 33) in her maternal aunt; Liver cancer in her paternal grandmother; Prostate cancer in her paternal grandfather; Prostate cancer (age of onset: 38) in her maternal uncle; Prostate cancer (age of onset: 87) in her maternal uncle; Prostate cancer (age of onset: 37) in her maternal grandfather.   ROS:  Please see the history of present illness.   All other systems are personally reviewed and negative.    Vitals:   04/23/23 1145  BP: 124/70  Pulse: 72  SpO2: 98%  Weight: 109.4 kg (241 lb 3.2 oz)    Exam:  General:  Well appearing. No resp difficulty HEENT: normal Neck: supple. no JVD. Carotids 2+ bilat; no bruits. No lymphadenopathy or thryomegaly appreciated. Cor: PMI nondisplaced. Regular rate & rhythm. No rubs, gallops or murmurs. Lungs: clear Abdomen: obese soft, nontender, nondistended. No hepatosplenomegaly. No bruits or masses. Good bowel sounds. Extremities: no cyanosis, clubbing, rash, edema Neuro: alert & orientedx3, cranial nerves grossly intact. moves all 4 extremities w/o difficulty. Affect pleasant   Recent Labs: 03/20/2023: ALT 27; BUN 19;  Creatinine 0.98; Hemoglobin 13.2; Platelet Count 261; Potassium 3.5; Sodium 138; TSH 2.120  Personally reviewed   Wt Readings from Last 3 Encounters:  04/23/23 109.4 kg (241 lb 3.2 oz)  04/16/23 108.4 kg (239 lb)  03/20/23 107.1 kg (236 lb 1.6 oz)      ASSESSMENT AND PLAN:  1. R breast invasive ductal carcinoma, T3N1M1, stage IV, ER-/PR-/HER2+, with metastases to b/l lungs, biopsy confirmed - Echo  05/19/21  EF 60-65% RV ok. GLS -24.9%  - Echo 4/24 EF 60-65% RV ok. GLS -24.9% - Echo today 04/23/23 EF 60-65% Personally reviewed - I reviewed echos personally.  EF and Doppler parameters stable. No HF on exam. Continue Herceptin.  - Continue to follow echos q32months   2. HTN - Blood pressure well controlled. Continue current regimen.   Arvilla Meres, MD  12:08 PM  [facility] [address] [facility] [place] [address] [phone] (office) [phone] (fax)

## 2023-05-15 ENCOUNTER — Inpatient Hospital Stay: Payer: BC Managed Care – PPO | Attending: Hematology

## 2023-05-15 VITALS — BP 131/76 | HR 77 | Temp 98.5°F | Resp 18

## 2023-05-15 DIAGNOSIS — C7800 Secondary malignant neoplasm of unspecified lung: Secondary | ICD-10-CM | POA: Diagnosis not present

## 2023-05-15 DIAGNOSIS — D649 Anemia, unspecified: Secondary | ICD-10-CM | POA: Insufficient documentation

## 2023-05-15 DIAGNOSIS — Z171 Estrogen receptor negative status [ER-]: Secondary | ICD-10-CM | POA: Diagnosis not present

## 2023-05-15 DIAGNOSIS — C50911 Malignant neoplasm of unspecified site of right female breast: Secondary | ICD-10-CM | POA: Insufficient documentation

## 2023-05-15 DIAGNOSIS — Z5112 Encounter for antineoplastic immunotherapy: Secondary | ICD-10-CM | POA: Diagnosis not present

## 2023-05-15 MED ORDER — TRASTUZUMAB-ANNS CHEMO 150 MG IV SOLR
6.0000 mg/kg | Freq: Once | INTRAVENOUS | Status: AC
  Administered 2023-05-15: 600 mg via INTRAVENOUS
  Filled 2023-05-15: qty 28.57

## 2023-05-15 MED ORDER — HEPARIN SOD (PORK) LOCK FLUSH 100 UNIT/ML IV SOLN
500.0000 [IU] | Freq: Once | INTRAVENOUS | Status: AC | PRN
  Administered 2023-05-15: 500 [IU]

## 2023-05-15 MED ORDER — SODIUM CHLORIDE 0.9 % IV SOLN: Freq: Once | INTRAVENOUS | Status: AC

## 2023-05-15 MED ORDER — SODIUM CHLORIDE 0.9% FLUSH
10.0000 mL | INTRAVENOUS | Status: DC | PRN
  Administered 2023-05-15: 10 mL

## 2023-05-15 NOTE — Patient Instructions (Signed)
[facility]  Discharge Instructions: Thank you for choosing [facility] to provide your oncology and hematology care.   If you have a lab appointment with the Cancer Center, please go directly to the Cancer Center and check in at the registration area.   Wear comfortable clothing and clothing appropriate for easy access to any Portacath or PICC line.   We strive to give you quality time with your provider. You may need to reschedule your appointment if you arrive late (15 or more minutes).  Arriving late affects you and other patients whose appointments are after yours.  Also, if you miss three or more appointments without notifying the office, you may be dismissed from the clinic at the provider's discretion.      For prescription refill requests, have your pharmacy contact our office and allow 72 hours for refills to be completed.    Today you received the following chemotherapy and/or immunotherapy agents herceptin      To help prevent nausea and vomiting after your treatment, we encourage you to take your nausea medication as directed.  BELOW ARE SYMPTOMS THAT SHOULD BE REPORTED IMMEDIATELY: *FEVER GREATER THAN 100.4 F (38 C) OR HIGHER *CHILLS OR SWEATING *NAUSEA AND VOMITING THAT IS NOT CONTROLLED WITH YOUR NAUSEA MEDICATION *UNUSUAL SHORTNESS OF BREATH *UNUSUAL BRUISING OR BLEEDING *URINARY PROBLEMS (pain or burning when urinating, or frequent urination) *BOWEL PROBLEMS (unusual diarrhea, constipation, pain near the anus) TENDERNESS IN MOUTH AND THROAT WITH OR WITHOUT PRESENCE OF ULCERS (sore throat, sores in mouth, or a toothache) UNUSUAL RASH, SWELLING OR PAIN  UNUSUAL VAGINAL DISCHARGE OR ITCHING   Items with * indicate a potential emergency and should be followed up as soon as possible or go to the Emergency Department if any problems should occur.  Please show the CHEMOTHERAPY ALERT CARD or IMMUNOTHERAPY ALERT CARD at  check-in to the Emergency Department and triage nurse.  Should you have questions after your visit or need to cancel or reschedule your appointment, please contact [facility]  Dept: [phone]  and follow the prompts.  Office hours are 8:00 a.m. to 4:30 p.m. Monday - Friday. Please note that voicemails left after 4:00 p.m. may not be returned until the following business day.  We are closed weekends and major holidays. You have access to a nurse at all times for urgent questions. Please call the main number to the clinic Dept: [phone] and follow the prompts.   For any non-urgent questions, you may also contact your provider using MyChart. We now offer e-Visits for anyone 18 and older to request care online for non-urgent symptoms. For details visit [email or web address].   Also download the MyChart app! Go to the app store, search "MyChart", open the app, select [facility], and log in with your MyChart username and password.   

## 2023-05-19 ENCOUNTER — Other Ambulatory Visit: Payer: Self-pay

## 2023-05-20 ENCOUNTER — Telehealth: Payer: Self-pay | Admitting: Hematology

## 2023-05-20 ENCOUNTER — Other Ambulatory Visit: Payer: Self-pay

## 2023-05-20 DIAGNOSIS — C50911 Malignant neoplasm of unspecified site of right female breast: Secondary | ICD-10-CM | POA: Diagnosis not present

## 2023-05-21 ENCOUNTER — Other Ambulatory Visit: Payer: Self-pay

## 2023-05-27 DIAGNOSIS — C50911 Malignant neoplasm of unspecified site of right female breast: Secondary | ICD-10-CM | POA: Diagnosis not present

## 2023-06-03 DIAGNOSIS — C50911 Malignant neoplasm of unspecified site of right female breast: Secondary | ICD-10-CM | POA: Diagnosis not present

## 2023-06-10 NOTE — Assessment & Plan Note (Signed)
T3N1M1, stage IV, ER-/PR-/HER2+, [id number], NED -diagnosed 06/2014 with metastatic disease, s/p neoadjuvant chemo, right lumpectomy, adjuvant radiation and currently on Maintenance Herceptin since 04/12/15. Her restaging scan has showed NED -10/17/2022 - CTA chest - negative for PE or thoracic aortic dissection. No acute findings. There is subpleural scarring of anterior right upper lobe. No new nodules or new disease noted.  -10/17/2022 - CT abdomen and pelvis - colonic diverticulosis. Right renal stone without hydronephrosis.  -new restaging CT CAP due 10/2023.  -She has been on maintenance Herceptin for about 7.5 years.  She is probably cured, although I am not able to approve it. Will continue Herceptin for now, I would consider stopping therapy after 10 years -she continues to tolerate herceptin infusions well.  -03/20/2023 - patient complains of moderate to severe fatigue. She is unsure if this fatigue is similar to fatigue she has previously experienced related to chemotherapy treatments, or if this fatigue is worse. It has not been intrusive yet, but she feels like it will become intrusive soon. Increased concern because her aunt, also a breast cancer survivor, was diagnosed with liver cancer at the same time the patient was diagnosed with breast cancer, in 2015. The patient states that her aunt's only complaint was fatigue.  -Labs reviewed, overall stable. CBC normal. Glucose 162 with remainder of CMP normal.  -continue labs/flush and herceptin infusions every 28 days.  -F/u in 4 months  -reviewed CTA chest and CT abdomen and pelvis with the patient, done 10/2022. There was no evidence of metastatic or new disease noted.  -repeated CT CAP in 03/2023 was negative for recurrence, echo in 04/2023 showed normal EF

## 2023-06-11 ENCOUNTER — Encounter: Payer: Self-pay | Admitting: Hematology

## 2023-06-11 ENCOUNTER — Inpatient Hospital Stay: Payer: BC Managed Care – PPO

## 2023-06-11 ENCOUNTER — Inpatient Hospital Stay (HOSPITAL_BASED_OUTPATIENT_CLINIC_OR_DEPARTMENT_OTHER): Payer: BC Managed Care – PPO | Admitting: Hematology

## 2023-06-11 VITALS — BP 132/61 | HR 70 | Resp 18

## 2023-06-11 VITALS — BP 140/72 | HR 72 | Temp 97.8°F | Resp 19 | Wt 237.8 lb

## 2023-06-11 DIAGNOSIS — Z171 Estrogen receptor negative status [ER-]: Secondary | ICD-10-CM | POA: Diagnosis not present

## 2023-06-11 DIAGNOSIS — Z5112 Encounter for antineoplastic immunotherapy: Secondary | ICD-10-CM | POA: Diagnosis not present

## 2023-06-11 DIAGNOSIS — D649 Anemia, unspecified: Secondary | ICD-10-CM | POA: Diagnosis not present

## 2023-06-11 DIAGNOSIS — C7800 Secondary malignant neoplasm of unspecified lung: Secondary | ICD-10-CM

## 2023-06-11 DIAGNOSIS — Z95828 Presence of other vascular implants and grafts: Secondary | ICD-10-CM

## 2023-06-11 DIAGNOSIS — C50911 Malignant neoplasm of unspecified site of right female breast: Secondary | ICD-10-CM

## 2023-06-11 LAB — CBC WITH DIFFERENTIAL (CANCER CENTER ONLY)
Abs Immature Granulocytes: 0.02 10*3/uL (ref 0.00–0.07)
Basophils Absolute: 0 10*3/uL (ref 0.0–0.1)
Basophils Relative: 1 %
Eosinophils Absolute: 0.1 10*3/uL (ref 0.0–0.5)
Eosinophils Relative: 1 %
HCT: 35.7 % — ABNORMAL LOW (ref 36.0–46.0)
Hemoglobin: 12.2 g/dL (ref 12.0–15.0)
Immature Granulocytes: 0 %
Lymphocytes Relative: 34 %
Lymphs Abs: 2.2 10*3/uL (ref 0.7–4.0)
MCH: 31 pg (ref 26.0–34.0)
MCHC: 34.2 g/dL (ref 30.0–36.0)
MCV: 90.6 fL (ref 80.0–100.0)
Monocytes Absolute: 0.6 10*3/uL (ref 0.1–1.0)
Monocytes Relative: 9 %
Neutro Abs: 3.7 10*3/uL (ref 1.7–7.7)
Neutrophils Relative %: 55 %
Platelet Count: 236 10*3/uL (ref 150–400)
RBC: 3.94 MIL/uL (ref 3.87–5.11)
RDW: 13.1 % (ref 11.5–15.5)
WBC Count: 6.6 10*3/uL (ref 4.0–10.5)
nRBC: 0 % (ref 0.0–0.2)

## 2023-06-11 LAB — CMP (CANCER CENTER ONLY)
ALT: 21 U/L (ref 0–44)
AST: 8 U/L — ABNORMAL LOW (ref 15–41)
Albumin: 4 g/dL (ref 3.5–5.0)
Alkaline Phosphatase: 68 U/L (ref 38–126)
Anion gap: 7 (ref 5–15)
BUN: 15 mg/dL (ref 6–20)
CO2: 29 mmol/L (ref 22–32)
Calcium: 9.7 mg/dL (ref 8.9–10.3)
Chloride: 105 mmol/L (ref 98–111)
Creatinine: 0.88 mg/dL (ref 0.44–1.00)
GFR, Estimated: >60 mL/min (ref >60)
Glucose, Bld: 105 mg/dL — ABNORMAL HIGH (ref 70–99)
Potassium: 3.9 mmol/L (ref 3.5–5.1)
Sodium: 141 mmol/L (ref 135–145)
Total Bilirubin: 0.5 mg/dL (ref 0.3–1.2)
Total Protein: 7.9 g/dL (ref 6.5–8.1)

## 2023-06-11 MED ORDER — TRASTUZUMAB-ANNS CHEMO 150 MG IV SOLR
6.0000 mg/kg | Freq: Once | INTRAVENOUS | Status: AC
Start: 2023-06-11 — End: 2023-06-11
  Administered 2023-06-11: 600 mg via INTRAVENOUS
  Filled 2023-06-11: qty 28.57

## 2023-06-11 MED ORDER — SODIUM CHLORIDE 0.9% FLUSH
10.0000 mL | Freq: Once | INTRAVENOUS | Status: AC
  Administered 2023-06-11: 10 mL

## 2023-06-11 MED ORDER — SODIUM CHLORIDE 0.9 % IV SOLN: Freq: Once | INTRAVENOUS | Status: AC

## 2023-06-11 MED ORDER — SODIUM CHLORIDE 0.9% FLUSH
10.0000 mL | INTRAVENOUS | Status: DC | PRN
  Administered 2023-06-11: 10 mL

## 2023-06-11 MED ORDER — HEPARIN SOD (PORK) LOCK FLUSH 100 UNIT/ML IV SOLN
500.0000 [IU] | Freq: Once | INTRAVENOUS | Status: AC | PRN
  Administered 2023-06-11: 500 [IU]

## 2023-06-11 NOTE — Progress Notes (Signed)
[facility]   Telephone:(336) [phone] Fax:([phone]) [phone]   Clinic Follow up Note   Patient Care Team: [name], [name] as PCP - General (Family Medicine) Dorris Singh, DO (Family Medicine) Almond Lint, MD as Consulting Physician (General Surgery) Malachy Mood, MD as Consulting Physician (Hematology) Magda Kiel, MD (Obstetrics and Gynecology) Gala Romney, Bevelyn Buckles, MD as Consulting Physician (Cardiology)  Date of Service:  06/11/2023  CHIEF COMPLAINT: f/u of metastatic breast cancer  CURRENT THERAPY:  Trastuzumab injection every 28 days  Oncology History   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 Her restaging scan has showed NED -10/17/2022 - CTA chest - negative for PE or thoracic aortic dissection. No acute findings. There is subpleural scarring of anterior right upper lobe. No new nodules or new disease noted.  -10/17/2022 - CT abdomen and pelvis - colonic diverticulosis. Right renal stone without hydronephrosis.  -new restaging CT CAP due 10/2023.  -She has been on maintenance Herceptin for about 7.5 years.  She is probably cured, although I am not able to approve it. Will continue Herceptin for now, I would consider stopping therapy after 10 years -she continues to tolerate herceptin infusions well.  -03/20/2023 - patient complains of moderate to severe fatigue. She is unsure if this fatigue is similar to fatigue she has previously experienced related to chemotherapy treatments, or if this fatigue is worse. It has not been intrusive yet, but she feels like it will become intrusive soon. Increased concern because her aunt, also a breast cancer survivor, was diagnosed with liver cancer at the same time the patient was diagnosed with breast cancer, in 2015. The patient  states that her aunt's only complaint was fatigue.  -Labs reviewed, overall stable. CBC normal. Glucose 162 with remainder of CMP normal.  -continue labs/flush and herceptin infusions every 28 days.  -F/u in 4 months  -reviewed CTA chest and CT abdomen and pelvis with the patient, done 10/2022. There was no evidence of metastatic or new disease noted.  -repeated CT CAP in 03/2023 was negative for recurrence, echo in 04/2023 showed normal EF     Assessment and Plan    Metastatic Breast Cancer Stable disease with no new symptoms. Last scan in August was good. No new pain or breathing issues. Fatigue likely related to early work schedule. -Continue current treatment plan. -Next scan planned for June. -Check labs today, including kidney and liver function. Mammogram is due in December.  Anemia Resolved. Hemoglobin 12.2 today. -No further action needed at this time.  Vaccinations Flu and COVID vaccines received. -No further action needed at this time.     Plan -Lab reviewed, will proceed with trastuzumab injection today and continue every 28 days -Follow-up in 12 weeks    SUMMARY OF ONCOLOGIC HISTORY: Oncology History Overview Note  Breast cancer 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   Staging form: Breast, AJCC 7th Edition     Clinical stage from 07/22/2014: Stage IV (T3, N1, M1) - Unsigned     Breast cancer metastasized to lung (HCC)  07/02/2014 Mammogram   Mammogram showed a 2cm right beast mass and a 1.8cm right axillary node. MRI breast on 07/16/2014 showed 7cm R breast lesion and 4.4cm r axillary node    07/02/2014 Imaging   CT CAP: a 4.7cm mass in LUL lung and a 2.1cm mas in RML, and a small nodule in RUL,  suspecious for metastasis     07/09/2014 Initial Diagnosis   right IDA with b/l lung lesions, ER-/PR-/HER2+   07/09/2014 Initial Biopsy   US guided right breast mass and axillary node biopsy showed IDA, and DCIS, ER-/PR-/HER2+   07/26/2014 Pathologic Stage   Left lung mass by IR,  path revealed high grade carcinoma, morphology similar to breast tumor biopsy, TTF(-), NapsinA(-), ER(-)   08/04/2014 - 03/22/2015 Chemotherapy   weekly Paclitaxel 80mg /m2, trastuzumab and pertuzumab every 3 weeks   08/30/2014 Genetic Testing   BreastNext panel was negative. 17 genes including BRCA1, BRCA2, were negative for mutations.    10/04/2014 Imaging   Interval decrease in the right axillary lymphadenopathy. Bilateral pulmonary lesions with left hilar lymphadenopathy also markedly decreased in the interval. The left hilar lymphadenopathy has resolved.   12/20/2014 Imaging   restaging CT showed stable disease, no new lesions    03/29/2015 Pathology Results    right breast lumpectomy showed  chemotherapy treatment effect,  a 1 mm residual tumor,   margins were widely negative, 5 sentinel lymph nodes and 2 axillary lymph nodes were negative.    03/29/2015 Surgery    right breast lumpectomy and sentinel lymph node biopsy  by Dr. Donell Beers   04/12/2015 -  Chemotherapy   Herceptin maintenance therapy , 6 mg/kg, every 3 weeks since 04/12/15. Switched to Herceptin Hylecta injection q3weeks after 06/10/19.   04/12/2015 - 10/04/2022 Chemotherapy   Patient is on Treatment Plan : BREAST Trastuzumab q21d     05/03/2015 - 06/14/2015 Radiation Therapy   right breast adjuvant irradiation by Dr. Dayton Scrape    08/28/2016 Imaging   CT CAP w Contrast  IMPRESSION: 1. No acute process or evidence of metastatic disease within the chest, abdomen, or pelvis. 2. Similar to less well-defined left upper lobe density, likely scarring. No evidence of new or progressive pulmonary metastasis. 3. Right nephrolithiasis.   01/22/2017 Imaging   CT Chest W Contrast 01/22/17 IMPRESSION: 1. Stable exam. No new or progressive findings. No evidence for metastatic disease. 2. Left upper lobe architectural distortion/scarring is stable. 3. Nonobstructing right renal stone.   07/31/2017 Imaging   Bone Scan Whole Body 07/31/17   IMPRESSION: 1. No scintigraphic evidence of osseous metastatic disease. 2. Thoracolumbar scoliosis.    07/31/2017 Imaging   CT CAP W Contrast 07/31/17 IMPRESSION: 1. No findings of active or recurrent malignancy. 2. Other imaging findings of potential clinical significance: Aortic Atherosclerosis (ICD10-I70.0). Mild cardiomegaly. Postoperative and radiation therapy findings in the right chest. Nonobstructive right nephrolithiasis. Thoracolumbar scoliosis.     01/20/2018 Echocardiogram   ECHO 01/20/2018 Study Conclusions   - Left ventricle: The cavity size was normal. Wall thickness was   normal. Systolic function was normal. The estimated ejection   fraction was in the range of 55% to 60%. Wall motion was normal;   there were no regional wall motion abnormalities. Doppler   parameters are consistent with abnormal left ventricular   relaxation (grade 1 diastolic dysfunction). - Impressions: GLS -20.7% LS&' 10.4 cm.    02/17/2018 Imaging   02/17/2018 CT CAP IMPRESSION: 1. No evidence for residual or recurrent tumor or metastatic disease. 2.  Aortic Atherosclerosis (ICD10-I70.0). 3. Right renal calculi. 4. Scoliosis.   08/11/2018 Imaging   CT CAP W Contrast 08/11/18  IMPRESSION: No evidence for localized recurrence or metastatic disease in the chest, abdomen or pelvis.   02/23/2019 Imaging   CT CAP W Contrast  IMPRESSION: 1. No findings of active malignancy in the chest, abdomen, or  pelvis. 2. 3 mm right kidney lower pole nonobstructive renal calculus. 3. Thoracolumbar scoliosis.   12/08/2019 Imaging   CT CAP W contrast  IMPRESSION: 1. Unchanged post treatment appearance of the lungs. No evidence of recurrent or new metastatic disease in the chest, abdomen, or pelvis.   2. Postoperative findings of right lumpectomy and axillary lymph node dissection.   3.  Nonobstructive right nephrolithiasis.   11/17/2020 Imaging   CT CAP IMPRESSION: 1. No evidence of  recurrence or new metastatic disease within the chest, abdomen, or pelvis. 2. Similar post treatment changes in the lungs and postoperative findings of right lumpectomy and axillary lymph node dissection. 3. Colonic diverticulosis without findings of acute diverticulitis.   08/10/2021 Genetic Testing   Negative genetic testing on the CancerNext-Expanded+RNAinsight panel.  The report date is August 10, 2021.  The CancerNext-Expanded gene panel offered by [facility] and includes sequencing and rearrangement analysis for the following 77 genes: AIP, ALK, APC*, ATM*, AXIN2, BAP1, BARD1, BLM, BMPR1A, BRCA1*, BRCA2*, BRIP1*, CDC73, CDH1*, CDK4, CDKN1B, CDKN2A, CHEK2*, CTNNA1, DICER1, FANCC, FH, FLCN, GALNT12, KIF1B, LZTR1, MAX, MEN1, MET, MLH1*, MSH2*, MSH3, MSH6*, MUTYH*, NBN, NF1*, NF2, NTHL1, PALB2*, PHOX2B, PMS2*, POT1, PRKAR1A, PTCH1, PTEN*, RAD51C*, RAD51D*, RB1, RECQL, RET, SDHA, SDHAF2, SDHB, SDHC, SDHD, SMAD4, SMARCA4, SMARCB1, SMARCE1, STK11, SUFU, TMEM127, TP53*, TSC1, TSC2, VHL and XRCC2 (sequencing and deletion/duplication); EGFR, EGLN1, HOXB13, KIT, MITF, PDGFRA, POLD1, and POLE (sequencing only); EPCAM and GREM1 (deletion/duplication only). DNA and RNA analyses performed for * genes.    10/17/2022 Imaging   CTA Chest and CT abdomen and pelvis with contrast  IMPRESSION: 1. Negative for acute PE or thoracic aortic dissection. 2. No acute findings. 3. Colonic diverticulosis. 4. Right nephrolithiasis without hydronephrosis.   10/25/2022 - 10/25/2022 Chemotherapy   Patient is on Treatment Plan : BREAST MAINTENANCE Trastuzumab IV (6) or SQ (600) D1 q21d X 11 Cycles     10/25/2022 -  Chemotherapy   Patient is on Treatment Plan : BREAST MAINTENANCE Trastuzumab IV (6) or SQ (600) D1 q21d x 13 cycles        Discussed the use of AI scribe software for clinical note transcription with the patient, who gave verbal consent to proceed.  History of Present Illness   The patient, a  52 year old female with metastatic breast cancer, presents for a routine follow-up. She reports that her energy levels have remained consistent, attributing any fatigue to her early work schedule. She wakes up naturally between 5:00 and 5:30 AM and has chosen to work from 6:00 AM to 2:30 PM to accommodate this. She notes that waking up at 4:30 AM can be challenging and often results in her taking a nap after work. She denies any pain or breathing issues and reports that she is eating well. She has no additional concerns at this time.  The patient's last scan in August showed positive results, and she is scheduled for another scan in June. Her blood counts from today's lab work are all normal, resolving the slight anemia noted five months ago. In August, her thyroid and A1c levels were good. She believes any previous issues were related to her sleep cycle and work schedule.  The patient also has a history of anemia, which has since resolved. She is due for a mammogram in December, which will be her first non-ultrasonic, 3D mammogram. She is also scheduled for an echocardiogram with Dr. Marinus Maw in September.         All other systems were reviewed with the  patient and are negative.  MEDICAL HISTORY:  Past Medical History:  Diagnosis Date   Breast cancer (HCC)    Breast cancer (HCC)    CHF (congestive heart failure) (HCC)    Family history of breast cancer    Family history of prostate cancer    GERD (gastroesophageal reflux disease)    during pregnancy    Pneumonia    hx of pneumonia 08/2013    S/P radiation therapy 05/03/2015 through 06/14/2015                                                      Right breast 4680 cGy in 26 sessions, right breast boost 1000 cGy in 5 sessions.  Right supraclavicular/axillary region 4680 cGy with a supplemental PA field to bring the axillary dose up to 4500 cGy in 26 sessions    SURGICAL HISTORY: Past Surgical History:  Procedure Laterality Date   BREAST  LUMPECTOMY WITH RADIOACTIVE SEED AND SENTINEL LYMPH NODE BIOPSY Right 03/29/2015   Procedure: BREAST LUMPECTOMY WITH RADIOACTIVE SEED AND SENTINEL LYMPH NODE BIOPSY;  Surgeon: Almond Lint, MD;  Location: [facility];  Service: General;  Laterality: Right;   CESAREAN SECTION     CHOLECYSTECTOMY     ESSURE TUBAL LIGATION     PORTACATH PLACEMENT Left 07/21/2014   Procedure: INSERTION PORT-A-CATH;  Surgeon: Almond Lint, MD;  Location: [facility];  Service: General;  Laterality: Left;   WISDOM TOOTH EXTRACTION      I have reviewed the social history and family history with the patient and they are unchanged from previous note.  ALLERGIES:  is allergic to adhesive [tape], aspirin, and codeine.  MEDICATIONS:  Current Outpatient Medications  Medication Sig Dispense Refill   acetaminophen (TYLENOL) 500 MG tablet Take 500 mg by mouth every 6 (six) hours as needed.     Ascorbic Acid (VITAMIN C) 500 MG CHEW      Calcium-Phosphorus-Vitamin D (CALCIUM GUMMIES PO) Take by mouth daily.     Camphor-Eucalyptus-Menthol (VICKS VAPORUB EX) Apply 1 application. topically at bedtime. Applies under nose, throat and on chest as needed     cetirizine (ZYRTEC) 10 MG tablet Take 10 mg by mouth daily as needed for allergies (allergies).      fluticasone (FLONASE) 50 MCG/ACT nasal spray Place into both nostrils daily.     hydrocortisone 2.5 % cream Apply 1 application topically 2 (two) times daily as needed.     ibuprofen (ADVIL,MOTRIN) 800 MG tablet Take 1 tablet (800 mg total) by mouth every 8 (eight) hours as needed. 60 tablet 1   lidocaine-prilocaine (EMLA) cream APPLY TO THE AFFECTED AREA PRIOR TO CHEMOTHERAPY AS DIRECTED 30 g 2   loperamide (IMODIUM) 2 MG capsule Take 1 capsule (2 mg total) by mouth 4 (four) times daily as needed for diarrhea or loose stools. 12 capsule 0   losartan (COZAAR) 25 MG tablet Take 1 tablet (25 mg total) by mouth daily. 90 tablet 1   Multiple Vitamins-Minerals (MULTIVITAMIN  GUMMIES ADULT PO) Take 1 each by mouth daily. Women's Vitafusion gummie     spironolactone (ALDACTONE) 25 MG tablet TAKE 1 TABLET(25 MG) BY MOUTH DAILY 30 tablet 0   No current facility-administered medications for this visit.   Facility-Administered Medications Ordered in Other Visits  Medication Dose Route Frequency Provider Last Rate Last Admin  heparin lock flush 100 unit/mL  500 Units Intracatheter Once PRN Malachy Mood, MD       sodium chloride 0.9 % injection 10 mL  10 mL Intravenous PRN Malachy Mood, MD       sodium chloride flush (NS) 0.9 % injection 10 mL  10 mL Intravenous PRN Malachy Mood, MD       sodium chloride flush (NS) 0.9 % injection 10 mL  10 mL Intracatheter PRN Malachy Mood, MD       trastuzumab-anns [facility]) 600 mg in sodium chloride 0.9 % 250 mL chemo infusion  6 mg/kg (Treatment Plan Recorded) Intravenous Once Malachy Mood, MD        PHYSICAL EXAMINATION: ECOG PERFORMANCE STATUS: 0 - Asymptomatic  Vitals:   06/11/23 1509  BP: (!) 140/72  Pulse: 72  Resp: 19  Temp: 97.8 F (36.6 C)  SpO2: 100%   Wt Readings from Last 3 Encounters:  06/11/23 237 lb 12.8 oz (107.9 kg)  04/23/23 241 lb 3.2 oz (109.4 kg)  04/16/23 239 lb (108.4 kg)     GENERAL:alert, no distress and comfortable SKIN: skin color, texture, turgor are normal, no rashes or significant lesions EYES: normal, Conjunctiva are pink and non-injected, sclera clear NECK: supple, thyroid normal size, non-tender, without nodularity LYMPH:  no palpable lymphadenopathy in the cervical, axillary  LUNGS: clear to auscultation and percussion with normal breathing effort HEART: regular rate & rhythm and no murmurs and no lower extremity edema ABDOMEN:abdomen soft, non-tender and normal bowel sounds Musculoskeletal:no cyanosis of digits and no clubbing  NEURO: alert & oriented x 3 with fluent speech, no focal motor/sensory deficits    LABORATORY DATA:  I have reviewed the data as listed    Latest Ref Rng & Units  06/11/2023    2:45 PM 03/20/2023   12:50 PM 12/27/2022    1:58 PM  CBC  WBC 4.0 - 10.5 K/uL 6.6  7.3  7.4   Hemoglobin 12.0 - 15.0 g/dL 21.3  08.6  57.8   Hematocrit 36.0 - 46.0 % 35.7  38.3  34.6   Platelets 150 - 400 K/uL 236  261  225         Latest Ref Rng & Units 06/11/2023    2:45 PM 03/20/2023   12:50 PM 12/27/2022    1:58 PM  CMP  Glucose 70 - 99 mg/dL 469  629  97   BUN 6 - 20 mg/dL 15  19  11    Creatinine 0.44 - 1.00 mg/dL 5.28  4.13  2.44   Sodium 135 - 145 mmol/L 141  138  139   Potassium 3.5 - 5.1 mmol/L 3.9  3.5  3.6   Chloride 98 - 111 mmol/L 105  103  105   CO2 22 - 32 mmol/L 29  28  28    Calcium 8.9 - 10.3 mg/dL 9.7  9.1  8.7   Total Protein 6.5 - 8.1 g/dL 7.9  8.0  7.6   Total Bilirubin 0.3 - 1.2 mg/dL 0.5  0.4  0.4   Alkaline Phos 38 - 126 U/L 68  76  66   AST 15 - 41 U/L 8  10  14    ALT 0 - 44 U/L 21  27  24        RADIOGRAPHIC STUDIES: I have personally reviewed the radiological images as listed and agreed with the findings in the report. No results found.    No orders of the defined types were placed in this encounter.  All questions were answered. The patient knows to call the clinic with any problems, questions or concerns. No barriers to learning was detected. The total time spent in the appointment was 25 minutes.     Malachy Mood, MD 06/11/2023

## 2023-06-11 NOTE — Patient Instructions (Signed)
[facility]  Discharge Instructions: Thank you for choosing [facility] to provide your oncology and hematology care.   If you have a lab appointment with the Cancer Center, please go directly to the Cancer Center and check in at the registration area.   Wear comfortable clothing and clothing appropriate for easy access to any Portacath or PICC line.   We strive to give you quality time with your provider. You may need to reschedule your appointment if you arrive late (15 or more minutes).  Arriving late affects you and other patients whose appointments are after yours.  Also, if you miss three or more appointments without notifying the office, you may be dismissed from the clinic at the provider's discretion.      For prescription refill requests, have your pharmacy contact our office and allow 72 hours for refills to be completed.    Today you received the following chemotherapy and/or immunotherapy agents kanjinti  (trastuzumab)   To help prevent nausea and vomiting after your treatment, we encourage you to take your nausea medication as directed.  BELOW ARE SYMPTOMS THAT SHOULD BE REPORTED IMMEDIATELY: *FEVER GREATER THAN 100.4 F (38 C) OR HIGHER *CHILLS OR SWEATING *NAUSEA AND VOMITING THAT IS NOT CONTROLLED WITH YOUR NAUSEA MEDICATION *UNUSUAL SHORTNESS OF BREATH *UNUSUAL BRUISING OR BLEEDING *URINARY PROBLEMS (pain or burning when urinating, or frequent urination) *BOWEL PROBLEMS (unusual diarrhea, constipation, pain near the anus) TENDERNESS IN MOUTH AND THROAT WITH OR WITHOUT PRESENCE OF ULCERS (sore throat, sores in mouth, or a toothache) UNUSUAL RASH, SWELLING OR PAIN  UNUSUAL VAGINAL DISCHARGE OR ITCHING   Items with * indicate a potential emergency and should be followed up as soon as possible or go to the Emergency Department if any problems should occur.  Please show the CHEMOTHERAPY ALERT CARD or IMMUNOTHERAPY ALERT  CARD at check-in to the Emergency Department and triage nurse.  Should you have questions after your visit or need to cancel or reschedule your appointment, please contact [facility]  Dept: [phone]  and follow the prompts.  Office hours are 8:00 a.m. to 4:30 p.m. Monday - Friday. Please note that voicemails left after 4:00 p.m. may not be returned until the following business day.  We are closed weekends and major holidays. You have access to a nurse at all times for urgent questions. Please call the main number to the clinic Dept: [phone] and follow the prompts.   For any non-urgent questions, you may also contact your provider using MyChart. We now offer e-Visits for anyone 18 and older to request care online for non-urgent symptoms. For details visit [email or web address].   Also download the MyChart app! Go to the app store, search "MyChart", open the app, select [facility], and log in with your MyChart username and password.  Trastuzumab Injection What is this medication? TRASTUZUMAB (tras TOO zoo mab) treats breast cancer and stomach cancer. It works by blocking a protein that causes cancer cells to grow and multiply. This helps to slow or stop the spread of cancer cells. This medicine may be used for other purposes; ask your health care provider or pharmacist if you have questions. COMMON BRAND NAME(S): Herceptin, Herzuma, KANJINTI, Ogivri, Ontruzant, Trazimera What should I tell my care team before I take this medication? They need to know if you have any of these conditions: Heart failure Lung disease An unusual or allergic reaction to trastuzumab, other medications, foods,   dyes, or preservatives Pregnant or trying to get pregnant Breast-feeding How should I use this medication? This medication is injected into a vein. It is given by your care team in a hospital or clinic setting. Talk to your care team about the use of this  medication in children. It is not approved for use in children. Overdosage: If you think you have taken too much of this medicine contact a poison control center or emergency room at once. NOTE: This medicine is only for you. Do not share this medicine with others. What if I miss a dose? Keep appointments for follow-up doses. It is important not to miss your dose. Call your care team if you are unable to keep an appointment. What may interact with this medication? Certain types of chemotherapy, such as daunorubicin, doxorubicin, epirubicin, idarubicin This list may not describe all possible interactions. Give your health care provider a list of all the medicines, herbs, non-prescription drugs, or dietary supplements you use. Also tell them if you smoke, drink alcohol, or use illegal drugs. Some items may interact with your medicine. What should I watch for while using this medication? Your condition will be monitored carefully while you are receiving this medication. This medication may make you feel generally unwell. This is not uncommon, as chemotherapy affects healthy cells as well as cancer cells. Report any side effects. Continue your course of treatment even though you feel ill unless your care team tells you to stop. This medication may increase your risk of getting an infection. Call your care team for advice if you get a fever, chills, sore throat, or other symptoms of a cold or flu. Do not treat yourself. Try to avoid being around people who are sick. Avoid taking medications that contain aspirin, acetaminophen, ibuprofen, naproxen, or ketoprofen unless instructed by your care team. These medications can hide a fever. Talk to your care team if you may be pregnant. Serious birth defects can occur if you take this medication during pregnancy and for 7 months after the last dose. You will need a negative pregnancy test before starting this medication. Contraception is recommended while taking  this medication and for 7 months after the last dose. Your care team can help you find the option that works for you. Do not breastfeed while taking this medication and for 7 months after stopping treatment. What side effects may I notice from receiving this medication? Side effects that you should report to your care team as soon as possible: Allergic reactions or angioedema--skin rash, itching or hives, swelling of the face, eyes, lips, tongue, arms, or legs, trouble swallowing or breathing Dry cough, shortness of breath or trouble breathing Heart failure--shortness of breath, swelling of the ankles, feet, or hands, sudden weight gain, unusual weakness or fatigue Infection--fever, chills, cough, or sore throat Infusion reactions--chest pain, shortness of breath or trouble breathing, feeling faint or lightheaded Side effects that usually do not require medical attention (report to your care team if they continue or are bothersome): Diarrhea Dizziness Headache Nausea Trouble sleeping Vomiting This list may not describe all possible side effects. Call your doctor for medical advice about side effects. You may report side effects to FDA at 1-800-FDA-1088. Where should I keep my medication? This medication is given in a hospital or clinic. It will not be stored at home. NOTE: This sheet is a summary. It may not cover all possible information. If you have questions about this medicine, talk to your doctor, pharmacist, or health care provider.    2023 Elsevier/[employer] Standard (2021-11-30 00:00:00)  

## 2023-06-18 ENCOUNTER — Encounter (HOSPITAL_BASED_OUTPATIENT_CLINIC_OR_DEPARTMENT_OTHER): Payer: Self-pay

## 2023-06-18 ENCOUNTER — Emergency Department (HOSPITAL_BASED_OUTPATIENT_CLINIC_OR_DEPARTMENT_OTHER)
Admission: EM | Admit: 2023-06-18 | Discharge: 2023-06-18 | Disposition: A | Discharge status: Home / Self Care | Payer: BC Managed Care – PPO | Attending: Emergency Medicine | Admitting: Emergency Medicine

## 2023-06-18 ENCOUNTER — Emergency Department (HOSPITAL_BASED_OUTPATIENT_CLINIC_OR_DEPARTMENT_OTHER): Payer: BC Managed Care – PPO

## 2023-06-18 ENCOUNTER — Other Ambulatory Visit: Payer: Self-pay

## 2023-06-18 DIAGNOSIS — R079 Chest pain, unspecified: Secondary | ICD-10-CM | POA: Diagnosis not present

## 2023-06-18 DIAGNOSIS — I509 Heart failure, unspecified: Secondary | ICD-10-CM | POA: Diagnosis not present

## 2023-06-18 DIAGNOSIS — J329 Chronic sinusitis, unspecified: Secondary | ICD-10-CM | POA: Diagnosis not present

## 2023-06-18 DIAGNOSIS — R197 Diarrhea, unspecified: Secondary | ICD-10-CM | POA: Diagnosis not present

## 2023-06-18 DIAGNOSIS — Z1152 Encounter for screening for COVID-19: Secondary | ICD-10-CM | POA: Diagnosis not present

## 2023-06-18 DIAGNOSIS — Z79899 Other long term (current) drug therapy: Secondary | ICD-10-CM | POA: Diagnosis not present

## 2023-06-18 DIAGNOSIS — B349 Viral infection, unspecified: Secondary | ICD-10-CM | POA: Insufficient documentation

## 2023-06-18 DIAGNOSIS — R0789 Other chest pain: Secondary | ICD-10-CM | POA: Diagnosis not present

## 2023-06-18 DIAGNOSIS — Z853 Personal history of malignant neoplasm of breast: Secondary | ICD-10-CM | POA: Diagnosis not present

## 2023-06-18 DIAGNOSIS — R0981 Nasal congestion: Secondary | ICD-10-CM | POA: Diagnosis not present

## 2023-06-18 LAB — CBC
HCT: 39.5 % (ref 36.0–46.0)
Hemoglobin: 13.2 g/dL (ref 12.0–15.0)
MCH: 30 pg (ref 26.0–34.0)
MCHC: 33.4 g/dL (ref 30.0–36.0)
MCV: 89.8 fL (ref 80.0–100.0)
Platelets: 264 10*3/uL (ref 150–400)
RBC: 4.4 MIL/uL (ref 3.87–5.11)
RDW: 13.2 % (ref 11.5–15.5)
WBC: 7.2 10*3/uL (ref 4.0–10.5)
nRBC: 0 % (ref 0.0–0.2)

## 2023-06-18 LAB — BASIC METABOLIC PANEL
Anion gap: 11 (ref 5–15)
BUN: 14 mg/dL (ref 6–20)
CO2: 27 mmol/L (ref 22–32)
Calcium: 10.5 mg/dL — ABNORMAL HIGH (ref 8.9–10.3)
Chloride: 99 mmol/L (ref 98–111)
Creatinine, Ser: 0.91 mg/dL (ref 0.44–1.00)
GFR, Estimated: >60 mL/min (ref >60)
Glucose, Bld: 112 mg/dL — ABNORMAL HIGH (ref 70–99)
Potassium: 3.9 mmol/L (ref 3.5–5.1)
Sodium: 137 mmol/L (ref 135–145)

## 2023-06-18 LAB — PREGNANCY, URINE: Preg Test, Ur: NEGATIVE

## 2023-06-18 LAB — RESP PANEL BY RT-PCR (RSV, FLU A&B, COVID)  RVPGX2
Influenza A by PCR: NEGATIVE
Influenza B by PCR: NEGATIVE
Resp Syncytial Virus by PCR: NEGATIVE
SARS Coronavirus 2 by RT PCR: NEGATIVE

## 2023-06-18 LAB — TROPONIN I (HIGH SENSITIVITY): Troponin I (High Sensitivity): 3 ng/L (ref <18)

## 2023-06-18 MED ORDER — IBUPROFEN 800 MG PO TABS
800.0000 mg | ORAL_TABLET | Freq: Once | ORAL | Status: AC
Start: 2023-06-18 — End: 2023-06-18
  Administered 2023-06-18: 800 mg via ORAL
  Filled 2023-06-18: qty 1

## 2023-06-18 NOTE — ED Provider Notes (Signed)
[facility] Provider Note   CSN: [id number] Arrival date & time: 06/18/23  1217     History  Chief Complaint  Patient presents with   Diarrhea   Chest Pain    [name] [name] is a 52 y.o. female with a history of metastatic breast cancer, CHF, and GERD who presents the ED today for multiple concerns.  Patient reports sinus drainage, congestion, diarrhea, and intermittent chest pains for the past 4 days. The chest pain is located left chest and feels like pressure sensation.  No fevers, nausea, or vomiting.  She reports taking OTC medications without any improvement. 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  No additional complaints or concerns at this time.    Home Medications Prior to Admission medications   Medication Sig Start Date End Date Taking? Authorizing Provider  acetaminophen (TYLENOL) 500 MG tablet Take 500 mg by mouth every 6 (six) hours as needed.    [provider]  Ascorbic Acid (VITAMIN C) 500 MG CHEW  06/14/19   [provider]  Calcium-Phosphorus-Vitamin D (CALCIUM GUMMIES PO) Take by mouth daily.    [provider]  Camphor-Eucalyptus-Menthol (VICKS VAPORUB EX) Apply 1 application. topically at bedtime. Applies under nose, throat and on chest as needed    [provider]  cetirizine (ZYRTEC) 10 MG tablet Take 10 mg by mouth daily as needed for allergies (allergies).     [provider]  fluticasone (FLONASE) 50 MCG/ACT nasal spray Place into both nostrils daily.    [provider]  hydrocortisone 2.5 % cream Apply 1 application topically 2 (two) times daily as needed.    [provider]  ibuprofen (ADVIL,MOTRIN) 800 MG tablet Take 1 tablet (800 mg total) by mouth every 8 (eight) hours as needed. 04/08/17   Hudnall, Azucena Fallen, MD  lidocaine-prilocaine (EMLA) cream APPLY TO THE AFFECTED AREA  PRIOR TO CHEMOTHERAPY AS DIRECTED 11/16/22   Heilingoetter, Cassandra L, PA-C  loperamide (IMODIUM) 2 MG capsule Take 1 capsule (2 mg total) by mouth 4 (four) times daily as needed for diarrhea or loose stools. 10/17/22   Jeanelle Malling, PA  losartan (COZAAR) 25 MG tablet Take 1 tablet (25 mg total) by mouth daily. 04/01/23   Bensimhon, Bevelyn Buckles, MD  Multiple Vitamins-Minerals (MULTIVITAMIN GUMMIES ADULT PO) Take 1 each by mouth daily. Women's Vitafusion gummie    [provider]  spironolactone (ALDACTONE) 25 MG tablet TAKE 1 TABLET(25 MG) BY MOUTH DAILY 12/31/22   Bensimhon, Bevelyn Buckles, MD      Allergies    Adhesive [tape], Aspirin, and Codeine    Review of Systems   Review of Systems  Cardiovascular:  Positive for chest pain.  Gastrointestinal:  Positive for diarrhea.  All other systems reviewed and are negative.   Physical Exam Updated Vital Signs BP (!) 141/84   Pulse 68   Temp 99.1 F (37.3 C)   Resp 18   Ht 5\' 10"  (1.778 m)   Wt 107.5 kg   SpO2 98%   BMI 34.01 kg/m  Physical Exam Vitals and nursing note reviewed.  Constitutional:      General: She is not in acute distress.    Appearance: Normal appearance.  HENT:     Head: Normocephalic and atraumatic.     Right Ear: Tympanic membrane, ear canal and external ear normal.     Left Ear:  Tympanic membrane, ear canal and external ear normal.     Mouth/Throat:     Mouth: Mucous membranes are moist.     Pharynx: Oropharynx is clear. No oropharyngeal exudate or posterior oropharyngeal erythema.  Eyes:     Conjunctiva/sclera: Conjunctivae normal.     Pupils: Pupils are equal, round, and reactive to light.  Cardiovascular:     Rate and Rhythm: Normal rate and regular rhythm.     Pulses: Normal pulses.     Heart sounds: Normal heart sounds.  Pulmonary:     Effort: Pulmonary effort is normal.     Breath sounds: Normal breath sounds.  Abdominal:     Palpations: Abdomen is soft.     Tenderness: There is no abdominal  tenderness.  Skin:    General: Skin is warm and dry.     Findings: No rash.  Neurological:     General: No focal deficit present.     Mental Status: She is alert.     Motor: No weakness.  Psychiatric:        Mood and Affect: Mood normal.        Behavior: Behavior normal.    ED Results / Procedures / Treatments   Labs (all labs ordered are listed, but only abnormal results are displayed) Labs Reviewed  BASIC METABOLIC PANEL - Abnormal; Notable for the following components:      Result Value   Glucose, Bld 112 (*)    Calcium 10.5 (*)    All other components within normal limits  RESP PANEL BY RT-PCR (RSV, FLU A&B, COVID)  RVPGX2  CBC  PREGNANCY, URINE  TROPONIN I (HIGH SENSITIVITY)    EKG EKG Interpretation Date/Time:  Tuesday June 18 2023 12:43:40 EST Ventricular Rate:  87 PR Interval:  130 QRS Duration:  76 QT Interval:  350 QTC Calculation: 421 R Axis:   55  Text Interpretation: Normal sinus rhythm Normal ECG When compared with ECG of 17-Oct-2022 12:32, PREVIOUS ECG IS PRESENT Confirmed by Virgina Norfolk [phone]) on 06/18/2023 1:05:47 PM  Radiology DG Chest 2 View  Result Date: 06/18/2023 CLINICAL DATA:  Chest pain, sinus drainage, congestion EXAM: CHEST - 2 VIEW COMPARISON:  10/28/2018, 04/03/2023 FINDINGS: Frontal and lateral views of the chest demonstrate an unremarkable cardiac silhouette. Stable left chest wall port. No acute airspace disease, effusion, or pneumothorax. No acute bony abnormalities. Stable postsurgical changes of the right breast. IMPRESSION: 1. Stable chest, no acute process. Electronically Signed   By: Sharlet Salina M.D.   On: 06/18/2023 15:28    Procedures Procedures: not indicated.   Medications Ordered in ED Medications  ibuprofen (ADVIL) tablet 800 mg (800 mg Oral Given 06/18/23 1514)    ED Course/ Medical Decision Making/ A&P                                 Medical Decision Making Amount and/or Complexity of Data  Reviewed Labs: ordered. Radiology: ordered.  Risk Prescription drug management.   This patient presents to the ED for concern of chest pain and body aches, this involves an extensive number of treatment options, and is a complaint that carries with it a high risk of complications and morbidity.   Differential diagnosis includes: Yes, pneumonia, flu, COVID, RSV, other viral illness, CAD, costochondritis, electrolyte abnormality, etc.   Comorbidities  See HPI above   Additional History  Additional history obtained from previous records   Cardiac Monitoring /  EKG  The patient was maintained on a cardiac monitor.  I personally viewed and interpreted the cardiac monitored which showed: NSR with a heart rate of 87 bpm.   Lab Tests  I ordered and personally interpreted labs.  The pertinent results include:   Troponin of 3 BMP and CBC within normal limits - no acute electrolyte derangement, AKI, infection, or anemia Negative respiratory panel   Imaging Studies  I ordered imaging studies including CXR  I independently visualized and interpreted imaging which showed: Stable chest with no acute processes I agree with the radiologist interpretation   Problem List / ED Course / Critical Interventions / Medication Management  Chest pain and body aches I ordered medications including: Ibuprofen for body aches  Reevaluation of the patient after these medicines showed that the patient improved I have reviewed the patients home medicines and have made adjustments as needed   Social Determinants of Health  Physical activity   Test / Admission - Considered  Discussed findings with patient.  All questions were answered. She is hemodynamically stable to be discharged home. Return precautions provided.       Final Clinical Impression(s) / ED Diagnoses Final diagnoses:  Viral illness  Atypical chest pain    Rx / DC Orders ED Discharge Orders     None          Maxwell Marion, PA-C 06/18/23 2155    Virgina Norfolk, DO 06/21/23 [phone]

## 2023-06-18 NOTE — ED Triage Notes (Signed)
Since the weekend has had sinus drainage, congestion, intermittent chest pain, dizziness, diarrhea.

## 2023-06-18 NOTE — Discharge Instructions (Addendum)
As discussed, all your imaging and labs are all reassuring - no signs of acute infection.  Can alternate between Tylenol and ibuprofen as needed for fevers, if they develop, as well as for body aches.  Follow-up with your PCP in the next 5 to 7 days for reevaluation of your symptoms and chest pain.  Return to the ED if your symptoms worsen in the interim.

## 2023-07-10 ENCOUNTER — Inpatient Hospital Stay: Payer: BC Managed Care – PPO | Attending: Hematology

## 2023-07-10 ENCOUNTER — Ambulatory Visit: Payer: BC Managed Care – PPO | Admitting: Hematology

## 2023-07-10 ENCOUNTER — Other Ambulatory Visit: Payer: BC Managed Care – PPO

## 2023-07-10 VITALS — BP 113/75 | HR 93 | Temp 99.1°F | Resp 16 | Wt 239.0 lb

## 2023-07-10 DIAGNOSIS — Z5112 Encounter for antineoplastic immunotherapy: Secondary | ICD-10-CM | POA: Diagnosis not present

## 2023-07-10 DIAGNOSIS — C50911 Malignant neoplasm of unspecified site of right female breast: Secondary | ICD-10-CM | POA: Diagnosis not present

## 2023-07-10 MED ORDER — HEPARIN SOD (PORK) LOCK FLUSH 100 UNIT/ML IV SOLN
500.0000 [IU] | Freq: Once | INTRAVENOUS | Status: AC | PRN
  Administered 2023-07-10: 500 [IU]

## 2023-07-10 MED ORDER — SODIUM CHLORIDE 0.9 % IV SOLN: Freq: Once | INTRAVENOUS | Status: AC

## 2023-07-10 MED ORDER — SODIUM CHLORIDE 0.9% FLUSH
10.0000 mL | INTRAVENOUS | Status: DC | PRN
Start: 2023-07-10 — End: 2023-07-10
  Administered 2023-07-10: 10 mL

## 2023-07-10 MED ORDER — TRASTUZUMAB-ANNS CHEMO 150 MG IV SOLR
6.0000 mg/kg | Freq: Once | INTRAVENOUS | Status: AC
  Administered 2023-07-10: 600 mg via INTRAVENOUS
  Filled 2023-07-10: qty 28.57

## 2023-07-10 NOTE — Patient Instructions (Signed)
[facility] - A DEPT OF [facility]  Discharge Instructions: Thank you for choosing [facility] to provide your oncology and hematology care.   If you have a lab appointment with the Cancer Center, please go directly to the Cancer Center and check in at the registration area.   Wear comfortable clothing and clothing appropriate for easy access to any Portacath or PICC line.   We strive to give you quality time with your provider. You may need to reschedule your appointment if you arrive late (15 or more minutes).  Arriving late affects you and other patients whose appointments are after yours.  Also, if you miss three or more appointments without notifying the office, you may be dismissed from the clinic at the provider's discretion.      For prescription refill requests, have your pharmacy contact our office and allow 72 hours for refills to be completed.    Today you received the following chemotherapy and/or immunotherapy agents herceptin      To help prevent nausea and vomiting after your treatment, we encourage you to take your nausea medication as directed.  BELOW ARE SYMPTOMS THAT SHOULD BE REPORTED IMMEDIATELY: *FEVER GREATER THAN 100.4 F (38 C) OR HIGHER *CHILLS OR SWEATING *NAUSEA AND VOMITING THAT IS NOT CONTROLLED WITH YOUR NAUSEA MEDICATION *UNUSUAL SHORTNESS OF BREATH *UNUSUAL BRUISING OR BLEEDING *URINARY PROBLEMS (pain or burning when urinating, or frequent urination) *BOWEL PROBLEMS (unusual diarrhea, constipation, pain near the anus) TENDERNESS IN MOUTH AND THROAT WITH OR WITHOUT PRESENCE OF ULCERS (sore throat, sores in mouth, or a toothache) UNUSUAL RASH, SWELLING OR PAIN  UNUSUAL VAGINAL DISCHARGE OR ITCHING   Items with * indicate a potential emergency and should be followed up as soon as possible or go to the Emergency Department if any problems should occur.  Please show the CHEMOTHERAPY ALERT CARD or IMMUNOTHERAPY  ALERT CARD at check-in to the Emergency Department and triage nurse.  Should you have questions after your visit or need to cancel or reschedule your appointment, please contact [facility] - A DEPT OF [facility]  Dept: [phone]  and follow the prompts.  Office hours are 8:00 a.m. to 4:30 p.m. Monday - Friday. Please note that voicemails left after 4:00 p.m. may not be returned until the following business day.  We are closed weekends and major holidays. You have access to a nurse at all times for urgent questions. Please call the main number to the clinic Dept: [phone] and follow the prompts.   For any non-urgent questions, you may also contact your provider using MyChart. We now offer e-Visits for anyone 44 and older to request care online for non-urgent symptoms. For details visit [email or web address].   Also download the MyChart app! Go to the app store, search "MyChart", open the app, select [place], and log in with your MyChart username and password.

## 2023-07-16 ENCOUNTER — Ambulatory Visit: Payer: BC Managed Care – PPO

## 2023-07-16 ENCOUNTER — Other Ambulatory Visit: Payer: BC Managed Care – PPO

## 2023-07-16 ENCOUNTER — Ambulatory Visit: Payer: BC Managed Care – PPO | Admitting: Hematology

## 2023-07-17 ENCOUNTER — Other Ambulatory Visit: Payer: Self-pay

## 2023-07-21 ENCOUNTER — Ambulatory Visit
Admission: EM | Admit: 2023-07-21 | Discharge: 2023-07-21 | Disposition: A | Discharge status: Home / Self Care | Payer: BC Managed Care – PPO | Attending: Urgent Care | Admitting: Urgent Care

## 2023-07-21 DIAGNOSIS — M25571 Pain in right ankle and joints of right foot: Secondary | ICD-10-CM

## 2023-07-21 MED ORDER — MELOXICAM 7.5 MG PO TABS
7.5000 mg | ORAL_TABLET | Freq: Every day | ORAL | 0 refills | Status: DC
Start: 2023-07-21 — End: 2023-08-05

## 2023-07-21 NOTE — ED Triage Notes (Signed)
Pt c/o pain to right achilles area/ankle pain started while she was walking 9 days ago-denies injury-ibuprofen first few days/none recently-NAD-limping gait

## 2023-07-21 NOTE — ED Provider Notes (Signed)
[facility] - [facility]RGENT CARE CENTER  Note:  This document was prepared using Conservation officer, historic buildings and may include unintentional dictation errors.  MRN: [id number] DOB: [birth year]  Subjective:   [name] [name] is a 52 y.o. female presenting for 9-day history of acute onset persistent right ankle pain.  Initially the pain started over the Achilles tendon and has now radiated to either side of her ankle.  No fall, trauma.  Patient has been doing walking for exercise.  She has used ibuprofen for the first few days but not recently.  She has previously seen an orthopedist at [facility].  No current facility-administered medications for this encounter.  Current Outpatient Medications:    acetaminophen (TYLENOL) 500 MG tablet, Take 500 mg by mouth every 6 (six) hours as needed., Disp: , Rfl:    Ascorbic Acid (VITAMIN C) 500 MG CHEW, , Disp: , Rfl:    Calcium-Phosphorus-Vitamin D (CALCIUM GUMMIES PO), Take by mouth daily., Disp: , Rfl:    Camphor-Eucalyptus-Menthol (VICKS VAPORUB EX), Apply 1 application. topically at bedtime. Applies under nose, throat and on chest as needed, Disp: , Rfl:    cetirizine (ZYRTEC) 10 MG tablet, Take 10 mg by mouth daily as needed for allergies (allergies). , Disp: , Rfl:    fluticasone (FLONASE) 50 MCG/ACT nasal spray, Place into both nostrils daily., Disp: , Rfl:    hydrocortisone 2.5 % cream, Apply 1 application topically 2 (two) times daily as needed., Disp: , Rfl:    ibuprofen (ADVIL,MOTRIN) 800 MG tablet, Take 1 tablet (800 mg total) by mouth every 8 (eight) hours as needed., Disp: 60 tablet, Rfl: 1   lidocaine-prilocaine (EMLA) cream, APPLY TO THE AFFECTED AREA PRIOR TO CHEMOTHERAPY AS DIRECTED, Disp: 30 g, Rfl: 2   loperamide (IMODIUM) 2 MG capsule, Take 1 capsule (2 mg total) by mouth 4 (four) times daily as needed for diarrhea or loose stools., Disp: 12 capsule, Rfl: 0   losartan (COZAAR) 25 MG tablet, Take 1 tablet (25 mg total) by  mouth daily., Disp: 90 tablet, Rfl: 1   Multiple Vitamins-Minerals (MULTIVITAMIN GUMMIES ADULT PO), Take 1 each by mouth daily. Women's Vitafusion gummie, Disp: , Rfl:    spironolactone (ALDACTONE) 25 MG tablet, TAKE 1 TABLET(25 MG) BY MOUTH DAILY, Disp: 30 tablet, Rfl: 0  Facility-Administered Medications Ordered in Other Encounters:    sodium chloride 0.9 % injection 10 mL, 10 mL, Intravenous, PRN, Malachy Mood, MD   sodium chloride flush (NS) 0.9 % injection 10 mL, 10 mL, Intravenous, PRN, Malachy Mood, MD   Allergies  Allergen Reactions   Adhesive [Tape] Rash and Other (See Comments)    "tape burn"   Aspirin Nausea Only    Upsets stomach   Codeine Other (See Comments)    Makes loopy     Past Medical History:  Diagnosis Date   Breast cancer (HCC)    Breast cancer (HCC)    CHF (congestive heart failure) (HCC)    Family history of breast cancer    Family history of prostate cancer    GERD (gastroesophageal reflux disease)    during pregnancy    Pneumonia    hx of pneumonia 08/2013    S/P radiation therapy 05/03/2015 through 06/14/2015  Right breast 4680 cGy in 26 sessions, right breast boost 1000 cGy in 5 sessions.  Right supraclavicular/axillary region 4680 cGy with a supplemental PA field to bring the axillary dose up to 4500 cGy in 26 sessions     Past Surgical History:  Procedure Laterality Date   BREAST LUMPECTOMY WITH RADIOACTIVE SEED AND SENTINEL LYMPH NODE BIOPSY Right 03/29/2015   Procedure: BREAST LUMPECTOMY WITH RADIOACTIVE SEED AND SENTINEL LYMPH NODE BIOPSY;  Surgeon: Almond Lint, MD;  Location: [facility];  Service: General;  Laterality: Right;   CESAREAN SECTION     CHOLECYSTECTOMY     ESSURE TUBAL LIGATION     PORTACATH PLACEMENT Left 07/21/2014   Procedure: INSERTION PORT-A-CATH;  Surgeon: Almond Lint, MD;  Location: [facility];  Service: General;  Laterality: Left;   WISDOM TOOTH EXTRACTION       Family History  Problem Relation Age of Onset   Breast cancer Mother 28   Breast cancer Maternal Aunt 40   Breast cancer Maternal Aunt 59   Prostate cancer Maternal Uncle 73   Prostate cancer Maternal Uncle 68   Prostate cancer Maternal Grandfather 76   Liver cancer Paternal Grandmother    Prostate cancer Paternal Grandfather    Breast cancer Cousin 14       mat first cousin    Social History   Tobacco Use   Smoking status: Never   Smokeless tobacco: Never  Vaping Use   Vaping status: Never Used  Substance Use Topics   Alcohol use: Yes    Comment: weekly   Drug use: No    ROS   Objective:   Vitals: BP (!) 141/84 (BP Location: Left Arm)   Pulse 79   Temp 98.1 F (36.7 C) (Oral)   Resp 16   SpO2 96%   Physical Exam Constitutional:      General: She is not in acute distress.    Appearance: Normal appearance. She is well-developed. She is not ill-appearing, toxic-appearing or diaphoretic.  HENT:     Head: Normocephalic and atraumatic.     Nose: Nose normal.     Mouth/Throat:     Mouth: Mucous membranes are moist.  Eyes:     General: No scleral icterus.       Right eye: No discharge.        Left eye: No discharge.     Extraocular Movements: Extraocular movements intact.  Cardiovascular:     Rate and Rhythm: Normal rate.  Pulmonary:     Effort: Pulmonary effort is normal.  Musculoskeletal:     Comments: Negative Thompson sign.  Tenderness across the Achilles tendon and either side of the ankle.  Skin:    General: Skin is warm and dry.  Neurological:     General: No focal deficit present.     Mental Status: She is alert and oriented to person, place, and time.  Psychiatric:        Mood and Affect: Mood normal.        Behavior: Behavior normal.     Patient placed into a 4 inch Ace wrap for the right ankle.  Assessment and Plan :   PDMP not reviewed this encounter.  1. Acute right ankle pain    Suspect inflammatory pain related to overuse.   Recommended RICE method, meloxicam for pain.  Patient is not currently undergoing active cancer treatment, has been in remission for years.  Advised that she follow-up with an orthopedist as soon as possible.  Counseled  patient on potential for adverse effects with medications prescribed/recommended today, ER and return-to-clinic precautions discussed, patient verbalized understanding.    Wallis Bamberg, [place] 07/21/23 1015

## 2023-07-21 NOTE — Discharge Instructions (Addendum)
Start meloxicam to help with pain and inflammation. Use the Ace wrap during the day. Limit your walking for the next week. Follow up with your orthopedist.

## 2023-07-22 ENCOUNTER — Ambulatory Visit (INDEPENDENT_AMBULATORY_CARE_PROVIDER_SITE_OTHER): Payer: BC Managed Care – PPO | Admitting: Family Medicine

## 2023-07-22 VITALS — BP 150/90 | Ht 70.0 in | Wt 237.0 lb

## 2023-07-22 DIAGNOSIS — M25571 Pain in right ankle and joints of right foot: Secondary | ICD-10-CM | POA: Diagnosis not present

## 2023-07-22 DIAGNOSIS — M65979 Unspecified synovitis and tenosynovitis, unspecified ankle and foot: Secondary | ICD-10-CM | POA: Diagnosis not present

## 2023-07-22 DIAGNOSIS — M7661 Achilles tendinitis, right leg: Secondary | ICD-10-CM

## 2023-07-22 DIAGNOSIS — M7751 Other enthesopathy of right foot: Secondary | ICD-10-CM | POA: Diagnosis not present

## 2023-07-22 NOTE — Progress Notes (Unsigned)
CHIEF COMPLAINT: No chief complaint on file.  _____________________________________________________________ SUBJECTIVE  HPI  Pt is a 52 y.o. female here for evaluation of R foot pain  Ongoing for 1 week (last Friday)  [place], first day there she developed pain  No injuries/trauma, but all of a sudden developed some tugging in her heel  No issues when she rested  [place] same day went to dinner, felt the pain  Bothered her the whole time in [place]. Couldn't get foot comfortable   Not until Sunday she bought an ankle brace. Even when sitting still  Eventually lateral part of R foot started to compensating  Overcompensating getting back pain  Will randomly start throbbing 7-8/10. Sometimes feels like pulling  No numbness/tingling, but is a cancer survivor and a history of neuropathy in toes/fingers  Therapies: ibuprofen 400mg  x1. Yesterday went to [place], called in script for meloxicam has not gotten it yet  Heel inserts  Cast 6 weeks, ankle sprain, tore all ligaments ------------------------------------------------------------------------------------------------------ Past Medical History:  Diagnosis Date   Breast cancer (HCC)    Breast cancer (HCC)    CHF (congestive heart failure) (HCC)    Family history of breast cancer    Family history of prostate cancer    GERD (gastroesophageal reflux disease)    during pregnancy    Pneumonia    hx of pneumonia 08/2013    S/P radiation therapy 05/03/2015 through 06/14/2015                                                      Right breast 4680 cGy in 26 sessions, right breast boost 1000 cGy in 5 sessions.  Right supraclavicular/axillary region 4680 cGy with a supplemental PA field to bring the axillary dose up to 4500 cGy in 26 sessions    Past Surgical History:  Procedure Laterality Date   BREAST LUMPECTOMY WITH RADIOACTIVE SEED AND SENTINEL LYMPH NODE BIOPSY Right 03/29/2015   Procedure: BREAST LUMPECTOMY WITH RADIOACTIVE  SEED AND SENTINEL LYMPH NODE BIOPSY;  Surgeon: Almond Lint, MD;  Location: [facility];  Service: General;  Laterality: Right;   CESAREAN SECTION     CHOLECYSTECTOMY     ESSURE TUBAL LIGATION     PORTACATH PLACEMENT Left 07/21/2014   Procedure: INSERTION PORT-A-CATH;  Surgeon: Almond Lint, MD;  Location: [facility];  Service: General;  Laterality: Left;   WISDOM TOOTH EXTRACTION        Outpatient Encounter Medications as of 07/22/2023  Medication Sig   acetaminophen (TYLENOL) 500 MG tablet Take 500 mg by mouth every 6 (six) hours as needed.   Ascorbic Acid (VITAMIN C) 500 MG CHEW    Calcium-Phosphorus-Vitamin D (CALCIUM GUMMIES PO) Take by mouth daily.   Camphor-Eucalyptus-Menthol (VICKS VAPORUB EX) Apply 1 application. topically at bedtime. Applies under nose, throat and on chest as needed   cetirizine (ZYRTEC) 10 MG tablet Take 10 mg by mouth daily as needed for allergies (allergies).    fluticasone (FLONASE) 50 MCG/ACT nasal spray Place into both nostrils daily.   hydrocortisone 2.5 % cream Apply 1 application topically 2 (two) times daily as needed.   ibuprofen (ADVIL,MOTRIN) 800 MG tablet Take 1 tablet (800 mg total) by mouth every 8 (eight) hours as needed.   lidocaine-prilocaine (EMLA) cream APPLY TO THE AFFECTED AREA PRIOR TO CHEMOTHERAPY AS DIRECTED  loperamide (IMODIUM) 2 MG capsule Take 1 capsule (2 mg total) by mouth 4 (four) times daily as needed for diarrhea or loose stools.   losartan (COZAAR) 25 MG tablet Take 1 tablet (25 mg total) by mouth daily.   meloxicam (MOBIC) 7.5 MG tablet Take 1 tablet (7.5 mg total) by mouth daily.   Multiple Vitamins-Minerals (MULTIVITAMIN GUMMIES ADULT PO) Take 1 each by mouth daily. Women's Vitafusion gummie   spironolactone (ALDACTONE) 25 MG tablet TAKE 1 TABLET(25 MG) BY MOUTH DAILY   Facility-Administered Encounter Medications as of 07/22/2023  Medication   sodium chloride 0.9 % injection 10 mL   sodium chloride flush (NS) 0.9  % injection 10 mL    ------------------------------------------------------------------------------------------------------  _____________________________________________________________ OBJECTIVE  PHYSICAL EXAM  Today's Vitals   07/22/23 1051 07/22/23 1153  BP: (!) 154/83 (!) 150/90  Weight: 237 lb (107.5 kg)   Height: 5\' 10"  (1.778 m)    Body mass index is 34.01 kg/m.   reviewed  General: A+Ox3, no acute distress, well-nourished, appropriate affect CV: pulses 2+ regular, nondiaphoretic, no peripheral edema, cap refill <2sec Lungs: no audible wheezing, non-labored breathing, bilateral chest rise/fall, nontachypneic Skin: warm, well-perfused, non-icteric, no susp lesions or rashes Neuro: No focal deficits sensation intact, muscle tone wnl, no atrophy Psych: no signs of depression or anxiety MSK: ***    *** ankle Clean, dry, intact skin. *** ecchymosis, *** erythema, *** increased warmth. *** tenderness over ATFL. *** tenderness over the base of the fifth metatarsal, *** tenderness over proximal fibula. *** tenderness over medial/lateral malleolus. *** tenderness over navicular. Ankle strength ***/5. No calf pain or swelling.  *** ankle with full range of motion without pain, weakness, or instability Gait ***.   NEURO: sensation intact to light touch, no focal defecits VASCULAR: pulses +2/4 dorsalis pedis & posterior tibialis  Retrocalcaneal bursitis Peroneal tenosynovitis No osseous tenderness Thickened achilles Heel wedge Walking boot _____________________________________________________________ ASSESSMENT/PLAN Diagnoses and all orders for this visit:  Achilles tendinitis of right lower extremity  Retrocalcaneal bursitis (back of heel), right  Peroneal tenosynovitis    Summary: ***  Differential Diagnosis:  Treatment:   F/u in 2 weeks Calf pain provoked with palpation, seems to coincide with gastroc, no overlying skin changes, no warmth, no caldication  symptoms, negative homan's, did not wear compression socks while flying. Discussed warning symptoms that would raise suspicion for DVT, agreed to contact clinic if these symptoms occur No follow-ups on file.  Electronically signed by: Burna Forts, MD 07/22/2023 11:02 AM

## 2023-07-27 ENCOUNTER — Other Ambulatory Visit: Payer: Self-pay

## 2023-07-29 ENCOUNTER — Other Ambulatory Visit: Payer: Self-pay

## 2023-07-30 ENCOUNTER — Telehealth: Payer: Self-pay | Admitting: Hematology

## 2023-08-01 DIAGNOSIS — Z1231 Encounter for screening mammogram for malignant neoplasm of breast: Secondary | ICD-10-CM | POA: Diagnosis not present

## 2023-08-05 ENCOUNTER — Inpatient Hospital Stay: Payer: BC Managed Care – PPO | Attending: Hematology

## 2023-08-05 ENCOUNTER — Inpatient Hospital Stay: Payer: BC Managed Care – PPO

## 2023-08-05 ENCOUNTER — Ambulatory Visit (INDEPENDENT_AMBULATORY_CARE_PROVIDER_SITE_OTHER): Payer: BC Managed Care – PPO | Admitting: Family Medicine

## 2023-08-05 ENCOUNTER — Encounter: Payer: Self-pay | Admitting: Family Medicine

## 2023-08-05 ENCOUNTER — Other Ambulatory Visit: Payer: Self-pay | Admitting: Nurse Practitioner

## 2023-08-05 ENCOUNTER — Encounter: Payer: Self-pay | Admitting: Hematology

## 2023-08-05 VITALS — BP 146/68 | HR 100 | Temp 98.9°F | Resp 17 | Wt 238.5 lb

## 2023-08-05 VITALS — BP 130/72 | Ht 70.0 in | Wt 237.0 lb

## 2023-08-05 DIAGNOSIS — M7661 Achilles tendinitis, right leg: Secondary | ICD-10-CM

## 2023-08-05 DIAGNOSIS — Z5112 Encounter for antineoplastic immunotherapy: Secondary | ICD-10-CM | POA: Insufficient documentation

## 2023-08-05 DIAGNOSIS — C50911 Malignant neoplasm of unspecified site of right female breast: Secondary | ICD-10-CM | POA: Insufficient documentation

## 2023-08-05 DIAGNOSIS — M65979 Unspecified synovitis and tenosynovitis, unspecified ankle and foot: Secondary | ICD-10-CM

## 2023-08-05 MED ORDER — BENZONATATE 200 MG PO CAPS: 200.0000 mg | ORAL_CAPSULE | Freq: Three times a day (TID) | ORAL | 0 refills | Status: DC | PRN

## 2023-08-05 MED ORDER — TRASTUZUMAB-ANNS CHEMO 150 MG IV SOLR
6.0000 mg/kg | Freq: Once | INTRAVENOUS | Status: AC
  Administered 2023-08-05: 600 mg via INTRAVENOUS
  Filled 2023-08-05: qty 28.57

## 2023-08-05 MED ORDER — SODIUM CHLORIDE 0.9% FLUSH
10.0000 mL | INTRAVENOUS | Status: DC | PRN
Start: 2023-08-05 — End: 2023-08-05
  Administered 2023-08-05: 10 mL

## 2023-08-05 MED ORDER — HEPARIN SOD (PORK) LOCK FLUSH 100 UNIT/ML IV SOLN
500.0000 [IU] | Freq: Once | INTRAVENOUS | Status: AC | PRN
Start: 2023-08-05 — End: 2023-08-05
  Administered 2023-08-05: 500 [IU]

## 2023-08-05 MED ORDER — SODIUM CHLORIDE 0.9 % IV SOLN: Freq: Once | INTRAVENOUS | Status: AC

## 2023-08-05 MED ORDER — CELECOXIB 100 MG PO CAPS: 100.0000 mg | ORAL_CAPSULE | Freq: Two times a day (BID) | ORAL | 0 refills | Status: DC

## 2023-08-05 NOTE — Patient Instructions (Signed)
[facility] - A DEPT OF [facility]  Discharge Instructions: Thank you for choosing [facility] to provide your oncology and hematology care.   If you have a lab appointment with the Cancer Center, please go directly to the Cancer Center and check in at the registration area.   Wear comfortable clothing and clothing appropriate for easy access to any Portacath or PICC line.   We strive to give you quality time with your provider. You may need to reschedule your appointment if you arrive late (15 or more minutes).  Arriving late affects you and other patients whose appointments are after yours.  Also, if you miss three or more appointments without notifying the office, you may be dismissed from the clinic at the provider's discretion.      For prescription refill requests, have your pharmacy contact our office and allow 72 hours for refills to be completed.    Today you received the following chemotherapy and/or immunotherapy agents kanjinti  (trastuzumab)   To help prevent nausea and vomiting after your treatment, we encourage you to take your nausea medication as directed.  BELOW ARE SYMPTOMS THAT SHOULD BE REPORTED IMMEDIATELY: *FEVER GREATER THAN 100.4 F (38 C) OR HIGHER *CHILLS OR SWEATING *NAUSEA AND VOMITING THAT IS NOT CONTROLLED WITH YOUR NAUSEA MEDICATION *UNUSUAL SHORTNESS OF BREATH *UNUSUAL BRUISING OR BLEEDING *URINARY PROBLEMS (pain or burning when urinating, or frequent urination) *BOWEL PROBLEMS (unusual diarrhea, constipation, pain near the anus) TENDERNESS IN MOUTH AND THROAT WITH OR WITHOUT PRESENCE OF ULCERS (sore throat, sores in mouth, or a toothache) UNUSUAL RASH, SWELLING OR PAIN  UNUSUAL VAGINAL DISCHARGE OR ITCHING   Items with * indicate a potential emergency and should be followed up as soon as possible or go to the Emergency Department if any problems should occur.  Please show the CHEMOTHERAPY ALERT CARD or  IMMUNOTHERAPY ALERT CARD at check-in to the Emergency Department and triage nurse.  Should you have questions after your visit or need to cancel or reschedule your appointment, please contact [facility] - A DEPT OF [facility]  Dept: [phone]  and follow the prompts.  Office hours are 8:00 a.m. to 4:30 p.m. Monday - Friday. Please note that voicemails left after 4:00 p.m. may not be returned until the following business day.  We are closed weekends and major holidays. You have access to a nurse at all times for urgent questions. Please call the main number to the clinic Dept: [phone] and follow the prompts.   For any non-urgent questions, you may also contact your provider using MyChart. We now offer e-Visits for anyone 80 and older to request care online for non-urgent symptoms. For details visit [email or web address].   Also download the MyChart app! Go to the app store, search "MyChart", open the app, select , and log in with your MyChart username and password.  Trastuzumab Injection What is this medication? TRASTUZUMAB (tras TOO zoo mab) treats breast cancer and stomach cancer. It works by blocking a protein that causes cancer cells to grow and multiply. This helps to slow or stop the spread of cancer cells. This medicine may be used for other purposes; ask your health care provider or pharmacist if you have questions. COMMON BRAND NAME(S): Herceptin, Marlowe Alt, Ontruzant, Trazimera What should I tell my care team before I take this medication? They need to know if you have any of these conditions:  Heart failure Lung disease An unusual or allergic reaction to trastuzumab, other medications, foods, dyes, or preservatives Pregnant or trying to get pregnant Breast-feeding How should I use this medication? This medication is injected into a vein. It is given by your care team in a hospital or clinic setting. Talk to your  care team about the use of this medication in children. It is not approved for use in children. Overdosage: If you think you have taken too much of this medicine contact a poison control center or emergency room at once. NOTE: This medicine is only for you. Do not share this medicine with others. What if I miss a dose? Keep appointments for follow-up doses. It is important not to miss your dose. Call your care team if you are unable to keep an appointment. What may interact with this medication? Certain types of chemotherapy, such as daunorubicin, doxorubicin, epirubicin, idarubicin This list may not describe all possible interactions. Give your health care provider a list of all the medicines, herbs, non-prescription drugs, or dietary supplements you use. Also tell them if you smoke, drink alcohol, or use illegal drugs. Some items may interact with your medicine. What should I watch for while using this medication? Your condition will be monitored carefully while you are receiving this medication. This medication may make you feel generally unwell. This is not uncommon, as chemotherapy affects healthy cells as well as cancer cells. Report any side effects. Continue your course of treatment even though you feel ill unless your care team tells you to stop. This medication may increase your risk of getting an infection. Call your care team for advice if you get a fever, chills, sore throat, or other symptoms of a cold or flu. Do not treat yourself. Try to avoid being around people who are sick. Avoid taking medications that contain aspirin, acetaminophen, ibuprofen, naproxen, or ketoprofen unless instructed by your care team. These medications can hide a fever. Talk to your care team if you may be pregnant. Serious birth defects can occur if you take this medication during pregnancy and for 7 months after the last dose. You will need a negative pregnancy test before starting this medication. Contraception  is recommended while taking this medication and for 7 months after the last dose. Your care team can help you find the option that works for you. Do not breastfeed while taking this medication and for 7 months after stopping treatment. What side effects may I notice from receiving this medication? Side effects that you should report to your care team as soon as possible: Allergic reactions or angioedema--skin rash, itching or hives, swelling of the face, eyes, lips, tongue, arms, or legs, trouble swallowing or breathing Dry cough, shortness of breath or trouble breathing Heart failure--shortness of breath, swelling of the ankles, feet, or hands, sudden weight gain, unusual weakness or fatigue Infection--fever, chills, cough, or sore throat Infusion reactions--chest pain, shortness of breath or trouble breathing, feeling faint or lightheaded Side effects that usually do not require medical attention (report to your care team if they continue or are bothersome): Diarrhea Dizziness Headache Nausea Trouble sleeping Vomiting This list may not describe all possible side effects. Call your doctor for medical advice about side effects. You may report side effects to FDA at 1-800-FDA-1088. Where should I keep my medication? This medication is given in a hospital or clinic. It will not be stored at home. NOTE: This sheet is a summary. It may not cover all possible information. If you  have questions about this medicine, talk to your doctor, pharmacist, or health care provider.  2023 Elsevier/Gold Standard (2021-11-30 00:00:00)

## 2023-08-05 NOTE — Progress Notes (Unsigned)
CHIEF COMPLAINT: No chief complaint on file.  _____________________________________________________________ SUBJECTIVE  HPI  Pt is a 52 y.o. female here for follow-up of R achilles tendinitis/peroneal tenosynovitis. Presents with her daughter  Over the last two weeks has had some good weeks, however at times due to being on her feet all day she would have pain. Sometimes there is pain above the heel, but more often it's more encompassing and has more pain on the lateral side of her ankle. When she drives her foot is on the brake and it hurts on the medial side of her ankle. When her foot is on the gas it hurts on the lateral side. Notes that she has not been applying RICE as often. Does continue to use the heel wedge. Using Cam boot where able, doing HEP intermittently.   ------------------------------------------------------------------------------------------------------ Past Medical History:  Diagnosis Date   Breast cancer (HCC)    Breast cancer (HCC)    CHF (congestive heart failure) (HCC)    Family history of breast cancer    Family history of prostate cancer    GERD (gastroesophageal reflux disease)    during pregnancy    Pneumonia    hx of pneumonia 08/2013    S/P radiation therapy 05/03/2015 through 06/14/2015                                                      Right breast 4680 cGy in 26 sessions, right breast boost 1000 cGy in 5 sessions.  Right supraclavicular/axillary region 4680 cGy with a supplemental PA field to bring the axillary dose up to 4500 cGy in 26 sessions    Past Surgical History:  Procedure Laterality Date   BREAST LUMPECTOMY WITH RADIOACTIVE SEED AND SENTINEL LYMPH NODE BIOPSY Right 03/29/2015   Procedure: BREAST LUMPECTOMY WITH RADIOACTIVE SEED AND SENTINEL LYMPH NODE BIOPSY;  Surgeon: Almond Lint, MD;  Location: [facility];  Service: General;  Laterality: Right;   CESAREAN SECTION     CHOLECYSTECTOMY     ESSURE TUBAL LIGATION      PORTACATH PLACEMENT Left 07/21/2014   Procedure: INSERTION PORT-A-CATH;  Surgeon: Almond Lint, MD;  Location: [facility];  Service: General;  Laterality: Left;   WISDOM TOOTH EXTRACTION        Outpatient Encounter Medications as of 08/05/2023  Medication Sig   acetaminophen (TYLENOL) 500 MG tablet Take 500 mg by mouth every 6 (six) hours as needed.   Ascorbic Acid (VITAMIN C) 500 MG CHEW    Calcium-Phosphorus-Vitamin D (CALCIUM GUMMIES PO) Take by mouth daily.   Camphor-Eucalyptus-Menthol (VICKS VAPORUB EX) Apply 1 application. topically at bedtime. Applies under nose, throat and on chest as needed   cetirizine (ZYRTEC) 10 MG tablet Take 10 mg by mouth daily as needed for allergies (allergies).    fluticasone (FLONASE) 50 MCG/ACT nasal spray Place into both nostrils daily.   hydrocortisone 2.5 % cream Apply 1 application topically 2 (two) times daily as needed.   ibuprofen (ADVIL,MOTRIN) 800 MG tablet Take 1 tablet (800 mg total) by mouth every 8 (eight) hours as needed.   lidocaine-prilocaine (EMLA) cream APPLY TO THE AFFECTED AREA PRIOR TO CHEMOTHERAPY AS DIRECTED   losartan (COZAAR) 25 MG tablet Take 1 tablet (25 mg total) by mouth daily.   meloxicam (MOBIC) 7.5 MG tablet Take 1 tablet (7.5 mg total) by mouth  daily.   Multiple Vitamins-Minerals (MULTIVITAMIN GUMMIES ADULT PO) Take 1 each by mouth daily. Women's Vitafusion gummie   spironolactone (ALDACTONE) 25 MG tablet TAKE 1 TABLET(25 MG) BY MOUTH DAILY   Facility-Administered Encounter Medications as of 08/05/2023  Medication   sodium chloride 0.9 % injection 10 mL   sodium chloride flush (NS) 0.9 % injection 10 mL    ------------------------------------------------------------------------------------------------------  _____________________________________________________________ OBJECTIVE  PHYSICAL EXAM  Today's Vitals   08/05/23 1106 08/05/23 1149  BP: (!) 148/80 130/72  Weight: 237 lb (107.5 kg)   Height: 5\' 10"  (1.778 m)     Body mass index is 34.01 kg/m.   reviewed  General: A+Ox3, no acute distress, well-nourished, appropriate affect CV: pulses 2+ regular, nondiaphoretic, no peripheral edema, cap refill <2sec Lungs: no audible wheezing, non-labored breathing, bilateral chest rise/fall, nontachypneic Skin: warm, well-perfused, non-icteric, no susp lesions or rashes Neuro: CN II-XII intact bilaterally. Sensation intact, muscle tone wnl, no atrophy Psych: no signs of depression or anxiety MSK:   R ankle Clean, dry, intact skin. No ecchymosis, no erythema, no increased warmth. no tenderness over ATFL. no tenderness over the base of the fifth metatarsal, no tenderness over proximal fibula. no tenderness over medial/lateral malleolus or calcaneus. no tenderness over navicular.  Ankle/foot strength 5/5. FROM ankle, however plantar/dorsiflexion, inversion/eversion motions are provocative of lateral ankle pain overlying peroneal brevis/longus tendons, no longer with achilles tendon pain. No calf pain or swelling.  Calf tenderness is also no longer present.  TTP lateral ankle following peroneal tendons distribution. Negative homan's sign. Thompson test negative NEURO: sensation intact to light touch, no focal deficits/changes from baseline VASCULAR: pulses +2/4 dorsalis pedis & posterior tibialis  Limited POCUS demonstrating persistence of peroneal brevis and longus tenosynovitis, kager fat pad hypoechoic changes have improved. Mild thickening of achilles tendon, no neovascular changes _____________________________________________________________ ASSESSMENT/PLAN Diagnoses and all orders for this visit:  Peroneal tenosynovitis -     celecoxib (CELEBREX) 100 MG capsule; Take 1 capsule (100 mg total) by mouth 2 (two) times daily. -     Ambulatory referral to Physical Therapy  Achilles tendinitis of right lower extremity  Injury sustained 3 weeks ago. Achilles tendinitis has been improving, peroneal  tenosynovitis is now her primary concern and source of pain, which is not as persistent pain-wise, but does come and go and can be bothersome. Reviewed therapies, including ice therapy, keeping ankle elevated as often as possible. She has been taking meloxicam inconsistently, is open to trialing a different medication to help with pain; celebrex Rx sent, discussed that she should not take additional NSAIDs while taking medication. PT referral placed. Discussed having shoes fitted (e.g. [employer]). May consider orthotics referral. Continue with brace/cam boot as she feels comfortable. Advised to reach out to clinic if in 2 weeks pain is worsening or not improved at all; will obtain XR, consider duplex. Otherwise will see back in clinic in 4-6 weeks.   Electronically signed by: Burna Forts, MD 08/05/2023 11:10 AM

## 2023-08-05 NOTE — Patient Instructions (Addendum)
PT referral has been placed Please rest, ice, and elevate your ankle when you are able  15 minutes of ice, 15 minutes of rest Try wearing an ankle lace-up brace or a pneumatic stirrup ankle brace while you are driving. You can continue to wear the heel wedge  You can wear a lace-up brace when you are sleeping as well You can continue wearing the CAM boot for the next few weeks Consider going to [employer] to have yourself fitted for walking shoes Take meloxicam more consistently, ice more consistently  You can also try switching out meloxicam with celebrex. Do not take both at the same time, do not take ibuprofen or other NSAIDs together  You can also take tylenol while taking meloxicam or celebrex Keep your ankle elevated while at work when you can If your pain is still there over the next 2 weeks after doing these therapies, let our office know, please Let's have you follow up in 4-6 weeks

## 2023-08-26 ENCOUNTER — Other Ambulatory Visit: Payer: Self-pay

## 2023-08-26 ENCOUNTER — Encounter: Payer: Self-pay | Admitting: Physical Therapy

## 2023-08-26 ENCOUNTER — Ambulatory Visit: Payer: BC Managed Care – PPO | Attending: Family Medicine | Admitting: Physical Therapy

## 2023-08-26 DIAGNOSIS — M65979 Unspecified synovitis and tenosynovitis, unspecified ankle and foot: Secondary | ICD-10-CM | POA: Diagnosis not present

## 2023-08-26 DIAGNOSIS — M25671 Stiffness of right ankle, not elsewhere classified: Secondary | ICD-10-CM | POA: Insufficient documentation

## 2023-08-26 DIAGNOSIS — R262 Difficulty in walking, not elsewhere classified: Secondary | ICD-10-CM | POA: Diagnosis not present

## 2023-08-26 DIAGNOSIS — M6281 Muscle weakness (generalized): Secondary | ICD-10-CM | POA: Diagnosis not present

## 2023-08-26 DIAGNOSIS — M25571 Pain in right ankle and joints of right foot: Secondary | ICD-10-CM | POA: Insufficient documentation

## 2023-08-26 NOTE — Therapy (Addendum)
 OUTPATIENT PHYSICAL THERAPY LOWER EXTREMITY EVALUATION   Patient Name: [name] [name] MRN: [id number] DOB:March 21, 1971, 53 y.o., female Today's Date: 08/26/2023   END OF SESSION:  PT End of Session - 08/26/23 1527     Visit Number 1    Date for PT Re-Evaluation 10/21/23    Authorization Type BC[employer]    Progress Note Due on Visit 10    PT Start Time 1525    PT Stop Time 1615    PT Time Calculation (min) 50 min    Activity Tolerance Patient tolerated treatment well    Behavior During Therapy WFL for tasks assessed/performed             Past Medical History:  Diagnosis Date   Breast cancer (HCC)    Breast cancer (HCC)    CHF (congestive heart failure) (HCC)    Family history of breast cancer    Family history of prostate cancer    GERD (gastroesophageal reflux disease)    during pregnancy    Pneumonia    hx of pneumonia 08/2013    S/P radiation therapy 05/03/2015 through 06/14/2015                                                      Right breast 4680 cGy in 26 sessions, right breast boost 1000 cGy in 5 sessions.  Right supraclavicular/axillary region 4680 cGy with a supplemental PA field to bring the axillary dose up to 4500 cGy in 26 sessions   Past Surgical History:  Procedure Laterality Date   BREAST LUMPECTOMY WITH RADIOACTIVE SEED AND SENTINEL LYMPH NODE BIOPSY Right 03/29/2015   Procedure: BREAST LUMPECTOMY WITH RADIOACTIVE SEED AND SENTINEL LYMPH NODE BIOPSY;  Surgeon: Jina Nephew, MD;  Location: [facility];  Service: General;  Laterality: Right;   CESAREAN SECTION     CHOLECYSTECTOMY     ESSURE TUBAL LIGATION     PORTACATH PLACEMENT Left 07/21/2014   Procedure: INSERTION PORT-A-CATH;  Surgeon: Jina Nephew, MD;  Location: [facility];  Service: General;  Laterality: Left;   WISDOM TOOTH EXTRACTION     Patient Active Problem List   Diagnosis Date Noted   Other fatigue 03/20/2023   Hyperglycemia 03/20/2023   Family history of liver cancer 03/20/2023    Genetic testing 08/10/2021   Family history of prostate cancer 07/27/2021   Port-A-Cath in place 08/21/2018   Cellulitis of right breast 07/04/2018   Right arm pain 04/09/2017   Sinusitis 07/03/2016   Candidiasis of breast 04/05/2016   Eczema 04/05/2016   Port-A-Cath in place 12/06/2015   Seasonal allergies 10/04/2015   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   Peripheral neuropathy due to chemotherapy (HCC) 03/15/2015   Breast cancer metastasized to lung (HCC) 07/22/2014   Family history of breast cancer 07/22/2014   Lung mas bilaterial 07/22/2014    PCP: Beverlie Crazier, PA   REFERRING PROVIDER: Marcene Benedict ORN, MD   REFERRING DIAG: [phone] (ICD-10-CM) - Peroneal tenosynovitis   THERAPY DIAG:  Pain in right ankle and joints of right foot  Stiffness of right ankle, not elsewhere classified  Difficulty in walking, not elsewhere classified  Muscle weakness (generalized)  RATIONALE FOR EVALUATION AND TREATMENT: Rehabilitation  ONSET DATE: July 12, 2023  NEXT MD VISIT: 09/02/23   SUBJECTIVE:  SUBJECTIVE STATEMENT: Ankle started hurting the day after Thanksgiving.  I was just walking and started to feel a pull in the back of my foot, figured I would walk it off and I haven't walked if off yet. The boot has helped in terms of keeping it still, sudden movements hurt, but sometimes it starts throbbing even in the boot  She reports the tech told her to wear CAM boot at all times, sitting, standing or walking, but has to take if off to drive, finds it very irritating and is bothering her back a lot. She just bought a treadmill and wants to be able to walk on it.    PAIN: Are you having pain? Yes: NPRS scale: 5/10 up to 7-8/10 Pain location: R ankle (moves around sometimes  inside, sometimes outside, occasionally in the heel) Pain description: throbbing, pulling, ankle bone hurts (new) Aggravating factors: quick movements especially driving, transitional movements when can't wear boot Relieving factors: CAM boot, elevation, icing   PERTINENT HISTORY:  R Achilles tendinitis 06/2023 progressing to peroneal tenosynovitis, Peripheral neuropathy d/t chemotherapy, CHF, R breast cancer s/p lumpectomy with radioactive seed 03/2015, lung metastasis   PRECAUTIONS: Other: history of Breast CA with neuropathy  RED FLAGS: None  WEIGHT BEARING RESTRICTIONS: No  FALLS:  Has patient fallen in last 6 months? No  LIVING ENVIRONMENT: Lives with: lives with their family Lives in: House/apartment Stairs: Yes: Internal: 16 steps; on left going up Has following equipment at home:  CAM boot  OCCUPATION: Call center  PLOF: Independent and Leisure: treadmill walking - just bought prior to injury  PATIENT GOALS: use treadmill, decrease pain, wear regular shoes  OBJECTIVE: (objective measures completed at initial evaluation unless otherwise dated)  DIAGNOSTIC FINDINGS:  07/22/23 - Limited POCUS posterior ankle (right): Hypoechoic stranding w/o neovascular findings within kager's fat pad. Mild thickening of distal achilles tendon without appreciable fibrillar pattern disruption of tendon. Distal peroneal brevis and longus tenosynovitis spanning landmarks between posterior lateral malleolus to base of 5th MT without signs of fibrillar pattern disruption  PATIENT SURVEYS:  LEFS 22/80  COGNITION: Overall cognitive status: Within functional limits for tasks assessed    SENSATION: Neuropathy from chemo in toes and fingers  EDEMA:  Circumferential: R 57.5 cm, L 57.5 cm    POSTURE:  No Significant postural limitations  PALPATION: Tenderness along R peroneal tendons, achilles insertion R calf.  Crepitus with rear foot joint mobs.   MUSCLE LENGTH: Heelcord: Tightness R    LOWER EXTREMITY ROM:  Passive ROM Right eval Left eval  Knee extension ~ 10 deg hyperextension ~ 10 deg hyperextension  Ankle dorsiflexion 0 10  Ankle plantarflexion    Ankle inversion 30p! 40  Ankle eversion 40p! 40   LOWER EXTREMITY MMT:  MMT Right eval* Left eval*  Hip flexion 5 5  Hip extension    Hip abduction 5 5  Hip adduction 4+ 4+  Knee flexion 5 5  Knee extension 5 5  Ankle dorsiflexion 4+ 5  Ankle plantarflexion 4+ 5  Ankle inversion 4p! 5  Ankle eversion 4p! 5   (Blank rows = not tested) * tested in seated position  LOWER EXTREMITY SPECIAL TESTS:  Ankle special tests: Anterior drawer test: negative, Talar tilt test: negative, and Great toe extension test: negative  FUNCTIONAL TESTS:  Gait speed 1 m/s  GAIT: Distance walked: 50' Assistive device utilized:  CAM boot on R and None Level of assistance: Complete Independence Gait pattern: antalgic Comments: decreased gait speed, antalgic, wearing CAM  boot so increased steppage on R   TODAY'S TREATMENT:   08/26/2023 EVAL Therapeutic Exercise: to improve strength and mobility.  Demo, verbal and tactile cues throughout for technique. - Seated Ankle Alphabet  DEMO - Seated Calf Towel Stretch  - 3 reps - 30 sec hold - Seated Ankle Pumps  DEMO Self Care: Findings, POC, recommendations for how to wean out of CAM boot - can remove when sitting, try ankle brace for walking for gentle compression and for driving, if ankle hurts with walking continue to use CAM boot.   PEACE v. RICE    PATIENT EDUCATION:  Education details: findings, POC, initial HEP Person educated: Patient Education method: Explanation, Demonstration, Verbal cues, and Handouts Education comprehension: verbalized understanding and returned demonstration  HOME EXERCISE PROGRAM: Access Code: [email or web address] URL: [email or web address] Date: 08/26/2023 Prepared by: Almarie Sprinkles  Exercises - Seated Ankle Alphabet  - 1 x daily - 7 x  weekly - 1 sets - 1 reps - Seated Calf Towel Stretch  - 1 x daily - 7 x weekly - 1 sets - 3 reps - 30-45 sec hold - Seated Ankle Pumps  - 1 x daily - 7 x weekly - 3 sets - 10 reps   ASSESSMENT:  CLINICAL IMPRESSION: [name] [name] is a 53 y.o. female who was referred to physical therapy for evaluation and treatment for R ankle pain/Peroneal tenosynovitis.  Examination results are consistent with referral.  Patient reports onset of R ankle pain beginning 07/12/2023. Pain is worse with quick ankle movements, .  Patient has deficits in R ankle ROM, R LE flexibility; R ankle strength, and TTP with abnormal muscle tension in R gastroc/soleus which are interfering with ADLs and are impacting quality of life.  On LEFS patient scored 22/80 demonstrating 72.5% disability.  Adriel will benefit from skilled PT to address above deficits to improve mobility and activity tolerance with decreased pain interference.  OBJECTIVE IMPAIRMENTS: Abnormal gait, decreased activity tolerance, decreased balance, decreased endurance, decreased mobility, difficulty walking, decreased ROM, decreased strength, hypomobility, increased fascial restrictions, impaired perceived functional ability, increased muscle spasms, and pain.   ACTIVITY LIMITATIONS: carrying, lifting, bending, sitting, standing, squatting, sleeping, stairs, transfers, bed mobility, and locomotion level  PARTICIPATION LIMITATIONS: meal prep, cleaning, laundry, driving, shopping, community activity, and occupation  PERSONAL FACTORS: Past/current experiences, Time since onset of injury/illness/exacerbation, and 1-2 comorbidities: R Achilles tendinitis 06/2023 progressing to peroneal tenosynovitis, Peripheral neuropathy d/t chemotherapy, CHF, R breast cancer s/p lumpectomy with radioactive seed 03/2015, lung metastasis   are also affecting patient's functional outcome.   REHAB POTENTIAL: Good  CLINICAL DECISION MAKING: Stable/uncomplicated  EVALUATION  COMPLEXITY: Low   GOALS: Goals reviewed with patient? Yes  SHORT TERM GOALS: Target date: 09/16/2023  Patient will be independent with initial HEP. Baseline: given Goal status: INITIAL   LONG TERM GOALS: Target date: 10/21/2023  Patient will be independent with advanced/ongoing HEP to improve outcomes and carryover.  Baseline:  Goal status: INITIAL  2.  Patient will report at least 75% improvement in Right ankle/foot pain to improve QOL. Baseline: 5-8/10 Goal status: INITIAL  3.  Patient will demonstrate improved R ankle AROM to [facility] to allow for normal gait and stair mechanics. Baseline: see objective Goal status: INITIAL  4.  Patient will demonstrate improved R ankle strength to 5/5 without pain for improved stability and ease of mobility. Baseline: see objective Goal status: INITIAL  5.  Patient will be able to ambulate 600' without AD with normal gait pattern without increased foot/ankle  pain to access community.  Baseline: antalgic, walking with CAM boot Goal status: INITIAL  6. Patient will be able to ascend/descend stairs with 1 HR and reciprocal step pattern safely without pain to access home and community.  Baseline: having difficulty on stairs at home Goal status: INITIAL  7.  Patient will report >/= 31/80 on LEFS (MCID = 9 pts) to demonstrate improved functional ability. Baseline: 22/80 Goal status: INITIAL   PLAN:  PT FREQUENCY: 1-2x/week  PT DURATION: 8 weeks  PLANNED INTERVENTIONS: 97110-Therapeutic exercises, 97530- Therapeutic activity, 97112- Neuromuscular re-education, 97535- Self Care, 02859- Manual therapy, [phone]- Gait training, [phone]- Orthotic Fit/training, [phone]- Vasopneumatic device, [id number]- Ultrasound, 02966- Ionotophoresis 4mg /ml Dexamethasone , Patient/Family education, Balance training, Stair training, Taping, Dry Needling, Joint mobilization, Joint manipulation, Cryotherapy, and Moist heat  PLAN FOR NEXT SESSION: try taping for tendonitis,  continue to progress ankle strengthening - Tband exercises, BAPS board to start, manual therapy, modalities PRN - not interested in TrDN   Almarie JINNY Sprinkles, PT, DPT 08/26/2023, 4:51 PM

## 2023-08-30 ENCOUNTER — Telehealth: Payer: Self-pay | Admitting: Hematology

## 2023-08-30 ENCOUNTER — Other Ambulatory Visit: Payer: Self-pay | Admitting: Adult Health

## 2023-08-30 DIAGNOSIS — G620 Drug-induced polyneuropathy: Secondary | ICD-10-CM

## 2023-08-30 DIAGNOSIS — C50911 Malignant neoplasm of unspecified site of right female breast: Secondary | ICD-10-CM

## 2023-09-02 ENCOUNTER — Ambulatory Visit: Payer: BC Managed Care – PPO | Admitting: Family Medicine

## 2023-09-02 ENCOUNTER — Inpatient Hospital Stay: Payer: BC Managed Care – PPO | Attending: Hematology

## 2023-09-02 ENCOUNTER — Encounter: Payer: Self-pay | Admitting: Family Medicine

## 2023-09-02 ENCOUNTER — Ambulatory Visit (INDEPENDENT_AMBULATORY_CARE_PROVIDER_SITE_OTHER): Payer: BC Managed Care – PPO | Admitting: Family Medicine

## 2023-09-02 VITALS — BP 140/79 | HR 96 | Temp 98.3°F | Resp 20 | Wt 235.1 lb

## 2023-09-02 VITALS — BP 120/70 | Ht 70.0 in | Wt 237.0 lb

## 2023-09-02 DIAGNOSIS — M65979 Unspecified synovitis and tenosynovitis, unspecified ankle and foot: Secondary | ICD-10-CM

## 2023-09-02 DIAGNOSIS — Z5112 Encounter for antineoplastic immunotherapy: Secondary | ICD-10-CM | POA: Diagnosis not present

## 2023-09-02 DIAGNOSIS — C50911 Malignant neoplasm of unspecified site of right female breast: Secondary | ICD-10-CM | POA: Insufficient documentation

## 2023-09-02 MED ORDER — TRASTUZUMAB-ANNS CHEMO 150 MG IV SOLR
6.0000 mg/kg | Freq: Once | INTRAVENOUS | Status: AC
  Administered 2023-09-02: 600 mg via INTRAVENOUS
  Filled 2023-09-02: qty 28.57

## 2023-09-02 MED ORDER — HEPARIN SOD (PORK) LOCK FLUSH 100 UNIT/ML IV SOLN
500.0000 [IU] | Freq: Once | INTRAVENOUS | Status: AC | PRN
  Administered 2023-09-02: 500 [IU]

## 2023-09-02 MED ORDER — SODIUM CHLORIDE 0.9% FLUSH
10.0000 mL | INTRAVENOUS | Status: DC | PRN
Start: 2023-09-02 — End: 2023-09-02
  Administered 2023-09-02: 10 mL

## 2023-09-02 MED ORDER — SODIUM CHLORIDE 0.9 % IV SOLN: Freq: Once | INTRAVENOUS | Status: AC

## 2023-09-02 NOTE — Progress Notes (Signed)
CHIEF COMPLAINT: No chief complaint on file.  _____________________________________________________________ SUBJECTIVE  HPI  Pt is a 53 y.o. female here for Follow-up  Injury sustained late November 2024 (07/12/2023) while on vacation, walking long distances Last follow-up was 08/05/2023, for right Achilles tendinitis with concomitant peroneal tenosynovitis.  Had been continuing to work/walk with can walker.  Placed referral for physical therapy, discussed application of more diligent therapies such as RICE, work limitations Initial PT evaluation 08/26/2023, notes reviewed  Out of the Cam boot, is having far more improvement now  Has been using heat therapy which has been helpful on feet   Doing PT exercises (alphabet) routinely, feels the impact with it  Continues with elevation at work and at home Primarily located: curve of the ankle on both sides  Radiating: frequency decreasing considerably, the one time it was going up the leg Numbness/tingling: unchanged from baseline  ------------------------------------------------------------------------------------------------------ Past Medical History:  Diagnosis Date   Breast cancer (HCC)    Breast cancer (HCC)    CHF (congestive heart failure) (HCC)    Family history of breast cancer    Family history of prostate cancer    GERD (gastroesophageal reflux disease)    during pregnancy    Pneumonia    hx of pneumonia 08/2013    S/P radiation therapy 05/03/2015 through 06/14/2015                                                      Right breast 4680 cGy in 26 sessions, right breast boost 1000 cGy in 5 sessions.  Right supraclavicular/axillary region 4680 cGy with a supplemental PA field to bring the axillary dose up to 4500 cGy in 26 sessions    Past Surgical History:  Procedure Laterality Date   BREAST LUMPECTOMY WITH RADIOACTIVE SEED AND SENTINEL LYMPH NODE BIOPSY Right 03/29/2015   Procedure: BREAST LUMPECTOMY WITH RADIOACTIVE SEED  AND SENTINEL LYMPH NODE BIOPSY;  Surgeon: Almond Lint, MD;  Location: [facility];  Service: General;  Laterality: Right;   CESAREAN SECTION     CHOLECYSTECTOMY     ESSURE TUBAL LIGATION     PORTACATH PLACEMENT Left 07/21/2014   Procedure: INSERTION PORT-A-CATH;  Surgeon: Almond Lint, MD;  Location: [facility];  Service: General;  Laterality: Left;   WISDOM TOOTH EXTRACTION        Outpatient Encounter Medications as of 09/02/2023  Medication Sig   acetaminophen (TYLENOL) 500 MG tablet Take 500 mg by mouth every 6 (six) hours as needed.   Ascorbic Acid (VITAMIN C) 500 MG CHEW    benzonatate (TESSALON) 200 MG capsule Take 1 capsule (200 mg total) by mouth 3 (three) times daily as needed for cough.   Calcium-Phosphorus-Vitamin D (CALCIUM GUMMIES PO) Take by mouth daily.   Camphor-Eucalyptus-Menthol (VICKS VAPORUB EX) Apply 1 application. topically at bedtime. Applies under nose, throat and on chest as needed   celecoxib (CELEBREX) 100 MG capsule Take 1 capsule (100 mg total) by mouth 2 (two) times daily.   cetirizine (ZYRTEC) 10 MG tablet Take 10 mg by mouth daily as needed for allergies (allergies).    fluticasone (FLONASE) 50 MCG/ACT nasal spray Place into both nostrils daily.   hydrocortisone 2.5 % cream Apply 1 application topically 2 (two) times daily as needed.   ibuprofen (ADVIL,MOTRIN) 800 MG tablet Take 1 tablet (800 mg total)  by mouth every 8 (eight) hours as needed.   lidocaine-prilocaine (EMLA) cream APPLY TO THE AFFECTED AREA PRIOR TO CHEMOTHERAPY AS DIRECTED   losartan (COZAAR) 25 MG tablet Take 1 tablet (25 mg total) by mouth daily.   meloxicam (MOBIC) 7.5 MG tablet Take 7.5 mg by mouth daily.   Multiple Vitamins-Minerals (MULTIVITAMIN GUMMIES ADULT PO) Take 1 each by mouth daily. Women's Vitafusion gummie   spironolactone (ALDACTONE) 25 MG tablet TAKE 1 TABLET(25 MG) BY MOUTH DAILY   Facility-Administered Encounter Medications as of 09/02/2023  Medication   sodium  chloride 0.9 % injection 10 mL   sodium chloride flush (NS) 0.9 % injection 10 mL    ------------------------------------------------------------------------------------------------------  _____________________________________________________________ OBJECTIVE  PHYSICAL EXAM  Today's Vitals   09/02/23 0851  BP: 120/70  Weight: 237 lb (107.5 kg)  Height: 5\' 10"  (1.778 m)   Body mass index is 34.01 kg/m.   reviewed  General: A+Ox3, no acute distress, well-nourished, appropriate affect CV: pulses 2+ regular, nondiaphoretic, no peripheral edema, cap refill <2sec Lungs: no audible wheezing, non-labored breathing, bilateral chest rise/fall, nontachypneic Skin: warm, well-perfused, non-icteric, no susp lesions or rashes Neuro: Sensation intact, muscle tone wnl, no atrophy Psych: no signs of depression or anxiety MSK:   R ankle Clean, dry, intact skin. No ecchymosis, no erythema, no increased warmth. no tenderness over ATFL. no tenderness over the base of the fifth metatarsal, no tenderness over proximal fibula. no tenderness over medial/lateral malleolus or calcaneus. no tenderness over navicular. TTP lateral ankle following peroneal tendons distribution, not as severe as on prior exam. Ankle/foot strength 5/5. FROM ankle, however plantar/dorsiflexion, inversion/eversion motions are provocative of lateral ankle pain overlying peroneal brevis/longus tendons No calf pain or swelling.  NEURO: sensation intact to light touch, no focal deficits/changes from baseline VASCULAR: pulses +2/4 dorsalis pedis & posterior tibialis  _____________________________________________________________ ASSESSMENT/PLAN Diagnoses and all orders for this visit:  Peroneal tenosynovitis  Improving with PT, out of Cam boot. Discussed continuing PT at this time, continue to monitor for refractory or worsening changes. May consider referral for orthotics, shoewear evaluation. Consider advanced imaging if pain  recurs/worsens. All questions answered. Return precautions discussed. Patient verbalized understanding and is in agreement with plan. Anticipate f/u PRN 6-8 weeks as needed.   Electronically signed by: Burna Forts, MD 09/02/2023 9:13 AM

## 2023-09-02 NOTE — Patient Instructions (Signed)
 [facility] - A DEPT OF [facility]  Discharge Instructions: Thank you for choosing [facility] to provide your oncology and hematology care.   If you have a lab appointment with the Cancer Center, please go directly to the Cancer Center and check in at the registration area.   Wear comfortable clothing and clothing appropriate for easy access to any Portacath or PICC line.   We strive to give you quality time with your provider. You may need to reschedule your appointment if you arrive late (15 or more minutes).  Arriving late affects you and other patients whose appointments are after yours.  Also, if you miss three or more appointments without notifying the office, you may be dismissed from the clinic at the provider's discretion.      For prescription refill requests, have your pharmacy contact our office and allow 72 hours for refills to be completed.    Today you received the following chemotherapy and/or immunotherapy agents: trastuzumab      To help prevent nausea and vomiting after your treatment, we encourage you to take your nausea medication as directed.  BELOW ARE SYMPTOMS THAT SHOULD BE REPORTED IMMEDIATELY: *FEVER GREATER THAN 100.4 F (38 C) OR HIGHER *CHILLS OR SWEATING *NAUSEA AND VOMITING THAT IS NOT CONTROLLED WITH YOUR NAUSEA MEDICATION *UNUSUAL SHORTNESS OF BREATH *UNUSUAL BRUISING OR BLEEDING *URINARY PROBLEMS (pain or burning when urinating, or frequent urination) *BOWEL PROBLEMS (unusual diarrhea, constipation, pain near the anus) TENDERNESS IN MOUTH AND THROAT WITH OR WITHOUT PRESENCE OF ULCERS (sore throat, sores in mouth, or a toothache) UNUSUAL RASH, SWELLING OR PAIN  UNUSUAL VAGINAL DISCHARGE OR ITCHING   Items with * indicate a potential emergency and should be followed up as soon as possible or go to the Emergency Department if any problems should occur.  Please show the CHEMOTHERAPY ALERT CARD or  IMMUNOTHERAPY ALERT CARD at check-in to the Emergency Department and triage nurse.  Should you have questions after your visit or need to cancel or reschedule your appointment, please contact [facility] - A DEPT OF [facility]  Dept: [phone]  and follow the prompts.  Office hours are 8:00 a.m. to 4:30 p.m. Monday - Friday. Please note that voicemails left after 4:00 p.m. may not be returned until the following business day.  We are closed weekends and major holidays. You have access to a nurse at all times for urgent questions. Please call the main number to the clinic Dept: [phone] and follow the prompts.   For any non-urgent questions, you may also contact your provider using MyChart. We now offer e-Visits for anyone 56 and older to request care online for non-urgent symptoms. For details visit [email or web address].   Also download the MyChart app! Go to the app store, search "MyChart", open the app, select [facility], and log in with your MyChart username and password.

## 2023-09-03 ENCOUNTER — Other Ambulatory Visit: Payer: Self-pay | Admitting: *Deleted

## 2023-09-03 ENCOUNTER — Other Ambulatory Visit: Payer: Self-pay | Admitting: Nurse Practitioner

## 2023-09-03 DIAGNOSIS — C50911 Malignant neoplasm of unspecified site of right female breast: Secondary | ICD-10-CM

## 2023-09-03 DIAGNOSIS — T451X5A Adverse effect of antineoplastic and immunosuppressive drugs, initial encounter: Secondary | ICD-10-CM

## 2023-09-03 MED ORDER — GABAPENTIN 100 MG PO CAPS: ORAL_CAPSULE | ORAL | 1 refills | Status: DC

## 2023-09-03 NOTE — Telephone Encounter (Signed)
Hey sara. I added this back for the patient and sent to [facility] in [place]. Dr. Mosetta Putt had wanted to see her back in 4 months.  I see she is scheduled for infusion 09/30/2023. Will you make sure she is also scheduled for follow up? Thanks. -HB

## 2023-09-03 NOTE — Telephone Encounter (Signed)
Patient asked for a refill on the Gabapentin medication. It was discontinued on her medication list in August in error. She is not having side effects from this but has reduced her usage to "as needed, most nights" She would like to have it refilled as previously prescribed. This has helped with the neuropathy.   Refill sent to Dr. Mosetta Putt since we are adding this back onto the medication list.

## 2023-09-04 ENCOUNTER — Encounter: Payer: Self-pay | Admitting: Hematology

## 2023-09-04 ENCOUNTER — Telehealth: Payer: Self-pay | Admitting: Hematology

## 2023-09-04 ENCOUNTER — Other Ambulatory Visit: Payer: Self-pay | Admitting: Hematology

## 2023-09-04 ENCOUNTER — Ambulatory Visit: Payer: BC Managed Care – PPO

## 2023-09-04 DIAGNOSIS — C50911 Malignant neoplasm of unspecified site of right female breast: Secondary | ICD-10-CM

## 2023-09-04 NOTE — Telephone Encounter (Signed)
Patient is aware of scheduled appointment times/dates for 09/30/2023

## 2023-09-05 ENCOUNTER — Other Ambulatory Visit: Payer: Self-pay

## 2023-09-10 ENCOUNTER — Ambulatory Visit: Payer: BC Managed Care – PPO

## 2023-09-10 DIAGNOSIS — M6281 Muscle weakness (generalized): Secondary | ICD-10-CM

## 2023-09-10 DIAGNOSIS — M65979 Unspecified synovitis and tenosynovitis, unspecified ankle and foot: Secondary | ICD-10-CM | POA: Diagnosis not present

## 2023-09-10 DIAGNOSIS — M25571 Pain in right ankle and joints of right foot: Secondary | ICD-10-CM | POA: Diagnosis not present

## 2023-09-10 DIAGNOSIS — R262 Difficulty in walking, not elsewhere classified: Secondary | ICD-10-CM

## 2023-09-10 DIAGNOSIS — M25671 Stiffness of right ankle, not elsewhere classified: Secondary | ICD-10-CM | POA: Diagnosis not present

## 2023-09-10 NOTE — Therapy (Signed)
OUTPATIENT PHYSICAL THERAPY TREATMENT   Patient Name: [name] [name] MRN: [id number] DOB:1971/04/30, 53 y.o., female Today's Date: 09/10/2023   END OF SESSION:  PT End of Session - 09/10/23 1648     Visit Number 2    Date for PT Re-Evaluation 10/21/23    Authorization Type BCBS    Progress Note Due on Visit 10    PT Start Time 1532    PT Stop Time 1615    PT Time Calculation (min) 43 min    Activity Tolerance Patient tolerated treatment well    Behavior During Therapy WFL for tasks assessed/performed              Past Medical History:  Diagnosis Date   Breast cancer (HCC)    Breast cancer (HCC)    CHF (congestive heart failure) (HCC)    Family history of breast cancer    Family history of prostate cancer    GERD (gastroesophageal reflux disease)    during pregnancy    Pneumonia    hx of pneumonia 08/2013    S/P radiation therapy 05/03/2015 through 06/14/2015                                                      Right breast 4680 cGy in 26 sessions, right breast boost 1000 cGy in 5 sessions.  Right supraclavicular/axillary region 4680 cGy with a supplemental PA field to bring the axillary dose up to 4500 cGy in 26 sessions   Past Surgical History:  Procedure Laterality Date   BREAST LUMPECTOMY WITH RADIOACTIVE SEED AND SENTINEL LYMPH NODE BIOPSY Right 03/29/2015   Procedure: BREAST LUMPECTOMY WITH RADIOACTIVE SEED AND SENTINEL LYMPH NODE BIOPSY;  Surgeon: Almond Lint, MD;  Location: [facility];  Service: General;  Laterality: Right;   CESAREAN SECTION     CHOLECYSTECTOMY     ESSURE TUBAL LIGATION     PORTACATH PLACEMENT Left 07/21/2014   Procedure: INSERTION PORT-A-CATH;  Surgeon: Almond Lint, MD;  Location: [facility];  Service: General;  Laterality: Left;   WISDOM TOOTH EXTRACTION     Patient Active Problem List   Diagnosis Date Noted   Other fatigue 03/20/2023   Hyperglycemia 03/20/2023   Family history of liver cancer 03/20/2023   Genetic  testing 08/10/2021   Family history of prostate cancer 07/27/2021   Port-A-Cath in place 08/21/2018   Cellulitis of right breast 07/04/2018   Right arm pain 04/09/2017   Sinusitis 07/03/2016   Candidiasis of breast 04/05/2016   Eczema 04/05/2016   Port-A-Cath in place 12/06/2015   Seasonal allergies 10/04/2015   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   Peripheral neuropathy due to chemotherapy (HCC) 03/15/2015   Breast cancer metastasized to lung (HCC) 07/22/2014   Family history of breast cancer 07/22/2014   Lung mas bilaterial 07/22/2014    PCP: [name], PA   REFERRING PROVIDER: Burna Forts, MD   REFERRING DIAG: [phone] (ICD-10-CM) - Peroneal tenosynovitis   THERAPY DIAG:  Pain in right ankle and joints of right foot  Stiffness of right ankle, not elsewhere classified  Difficulty in walking, not elsewhere classified  Muscle weakness (generalized)  RATIONALE FOR EVALUATION AND TREATMENT: Rehabilitation  ONSET DATE: July 12, 2023  NEXT MD VISIT: 09/02/23   SUBJECTIVE:  SUBJECTIVE STATEMENT:    PAIN: Are you having pain? Yes: NPRS scale: 5/10 up to 7-8/10 Pain location: R ankle (moves around sometimes inside, sometimes outside, occasionally in the heel) Pain description: throbbing, pulling, ankle bone hurts (new) Aggravating factors: quick movements especially driving, transitional movements when can't wear boot Relieving factors: CAM boot, elevation, icing   PERTINENT HISTORY:  R Achilles tendinitis 06/2023 progressing to peroneal tenosynovitis, Peripheral neuropathy d/t chemotherapy, CHF, R breast cancer s/p lumpectomy with radioactive seed 03/2015, lung metastasis   PRECAUTIONS: Other: history of Breast CA with neuropathy  RED FLAGS: None  WEIGHT  BEARING RESTRICTIONS: No  FALLS:  Has patient fallen in last 6 months? No  LIVING ENVIRONMENT: Lives with: lives with their family Lives in: House/apartment Stairs: Yes: Internal: 16 steps; on left going up Has following equipment at home:  CAM boot  OCCUPATION: Call center  PLOF: Independent and Leisure: treadmill walking - just bought prior to injury  PATIENT GOALS: use treadmill, decrease pain, wear regular shoes  OBJECTIVE: (objective measures completed at initial evaluation unless otherwise dated)  DIAGNOSTIC FINDINGS:  07/22/23 - Limited POCUS posterior ankle (right): Hypoechoic stranding w/o neovascular findings within kager's fat pad. Mild thickening of distal achilles tendon without appreciable fibrillar pattern disruption of tendon. Distal peroneal brevis and longus tenosynovitis spanning landmarks between posterior lateral malleolus to base of 5th MT without signs of fibrillar pattern disruption  PATIENT SURVEYS:  LEFS 22/80  COGNITION: Overall cognitive status: Within functional limits for tasks assessed    SENSATION: Neuropathy from chemo in toes and fingers  EDEMA:  Circumferential: R 57.5 cm, L 57.5 cm    POSTURE:  No Significant postural limitations  PALPATION: Tenderness along R peroneal tendons, achilles insertion R calf.  Crepitus with rear foot joint mobs.   MUSCLE LENGTH: Heelcord: Tightness R   LOWER EXTREMITY ROM:  Passive ROM Right eval Left eval  Knee extension ~ 10 deg hyperextension ~ 10 deg hyperextension  Ankle dorsiflexion 0 10  Ankle plantarflexion    Ankle inversion 30p! 40  Ankle eversion 40p! 40   LOWER EXTREMITY MMT:  MMT Right eval* Left eval*  Hip flexion 5 5  Hip extension    Hip abduction 5 5  Hip adduction 4+ 4+  Knee flexion 5 5  Knee extension 5 5  Ankle dorsiflexion 4+ 5  Ankle plantarflexion 4+ 5  Ankle inversion 4p! 5  Ankle eversion 4p! 5   (Blank rows = not tested) * tested in seated position  LOWER  EXTREMITY SPECIAL TESTS:  Ankle special tests: Anterior drawer test: negative, Talar tilt test: negative, and Great toe extension test: negative  FUNCTIONAL TESTS:  Gait speed 1 m/s  GAIT: Distance walked: 15' Assistive device utilized:  CAM boot on R and None Level of assistance: Complete Independence Gait pattern: antalgic Comments: decreased gait speed, antalgic, wearing CAM boot so increased steppage on R   TODAY'S TREATMENT:  1/128/2025 EVAL Therapeutic Exercise: to improve strength and mobility.  Demo, verbal and tactile cues throughout for technique. Treadmill x 6 min 2.0 incline Gastroc stretch 2x30" with strap 4 way ankle YTB x 10  BAPS board: PF/DF/EV/IV circles both ways x 10 each Gait Training review- decreased step length B  08/26/2023 EVAL Therapeutic Exercise: to improve strength and mobility.  Demo, verbal and tactile cues throughout for technique. - Seated Ankle Alphabet  DEMO - Seated Calf Towel Stretch  - 3 reps - 30 sec hold - Seated Ankle Pumps  DEMO Self Care: Findings,  POC, recommendations for how to wean out of CAM boot - can remove when sitting, try ankle brace for walking for gentle compression and for driving, if ankle hurts with walking continue to use CAM boot.   PEACE v. RICE    PATIENT EDUCATION:  Education details: findings, POC, initial HEP Person educated: Patient Education method: Explanation, Demonstration, Verbal cues, and Handouts Education comprehension: verbalized understanding and returned demonstration  HOME EXERCISE PROGRAM: Access Code: [email or web address] URL: [email or web address] Date: 09/10/2023 Prepared by: Verta Ellen  Exercises - Seated Ankle Alphabet  - 1 x daily - 7 x weekly - 1 sets - 1 reps - Seated Calf Towel Stretch  - 1 x daily - 7 x weekly - 1 sets - 3 reps - 30-45 sec hold - Seated Ankle Pumps  - 1 x daily - 7 x weekly - 3 sets - 10 reps - Long Sitting Ankle Inversion with Resistance  - 1 x daily - 7 x  weekly - 2 sets - 10 reps - Long Sitting Ankle Eversion with Resistance  - 1 x daily - 7 x weekly - 2 sets - 10 reps - Long Sitting Ankle Dorsiflexion with Anchored Resistance  - 1 x daily - 7 x weekly - 2 sets - 10 reps   ASSESSMENT:  CLINICAL IMPRESSION: Pt now out of CAM boot but using ankle brace. Able to complete interventions, no pain but discomfort with ankle inversion and DF. She demonstrated good control with BAPS board. Will continue to progress to improve strength and ankle stability. Saylee will benefit from skilled PT to address above deficits to improve mobility and activity tolerance with decreased pain interference.  OBJECTIVE IMPAIRMENTS: Abnormal gait, decreased activity tolerance, decreased balance, decreased endurance, decreased mobility, difficulty walking, decreased ROM, decreased strength, hypomobility, increased fascial restrictions, impaired perceived functional ability, increased muscle spasms, and pain.   ACTIVITY LIMITATIONS: carrying, lifting, bending, sitting, standing, squatting, sleeping, stairs, transfers, bed mobility, and locomotion level  PARTICIPATION LIMITATIONS: meal prep, cleaning, laundry, driving, shopping, community activity, and occupation  PERSONAL FACTORS: Past/current experiences, Time since onset of injury/illness/exacerbation, and 1-2 comorbidities: R Achilles tendinitis 06/2023 progressing to peroneal tenosynovitis, Peripheral neuropathy d/t chemotherapy, CHF, R breast cancer s/p lumpectomy with radioactive seed 03/2015, lung metastasis   are also affecting patient's functional outcome.   REHAB POTENTIAL: Good  CLINICAL DECISION MAKING: Stable/uncomplicated  EVALUATION COMPLEXITY: Low   GOALS: Goals reviewed with patient? Yes  SHORT TERM GOALS: Target date: 09/16/2023  Patient will be independent with initial HEP. Baseline: given Goal status: IN PROGRESS   LONG TERM GOALS: Target date: 10/21/2023  Patient will be independent with  advanced/ongoing HEP to improve outcomes and carryover.  Baseline:  Goal status: IN PROGRESS  2.  Patient will report at least 75% improvement in Right ankle/foot pain to improve QOL. Baseline: 5-8/10 Goal status: IN PROGRESS  3.  Patient will demonstrate improved R ankle AROM to [facility] to allow for normal gait and stair mechanics. Baseline: see objective Goal status: IN PROGRESS  4.  Patient will demonstrate improved R ankle strength to 5/5 without pain for improved stability and ease of mobility. Baseline: see objective Goal status: IN PROGRESS  5.  Patient will be able to ambulate 600' without AD with normal gait pattern without increased foot/ankle pain to access community.  Baseline: antalgic, walking with CAM boot Goal status: IN PROGRESS  6. Patient will be able to ascend/descend stairs with 1 HR and reciprocal step pattern safely without pain to access home and  community.  Baseline: having difficulty on stairs at home Goal status: IN PROGRESS  7.  Patient will report >/= 31/80 on LEFS (MCID = 9 pts) to demonstrate improved functional ability. Baseline: 22/80 Goal status: IN PROGRESS   PLAN:  PT FREQUENCY: 1-2x/week  PT DURATION: 8 weeks  PLANNED INTERVENTIONS: 97110-Therapeutic exercises, 97530- Therapeutic activity, 97112- Neuromuscular re-education, 97535- Self Care, 11914- Manual therapy, [phone]- Gait training, [phone]- Orthotic Fit/training, [phone]- Vasopneumatic device, [id number]- Ultrasound, 46962- Ionotophoresis 4mg /ml Dexamethasone, Patient/Family education, Balance training, Stair training, Taping, Dry Needling, Joint mobilization, Joint manipulation, Cryotherapy, and Moist heat  PLAN FOR NEXT SESSION: try taping for tendonitis, continue to progress ankle strengthening - Tband exercises, BAPS board to start, manual therapy, modalities PRN - not interested in [employer], PTA,  09/10/2023, 4:50 PM

## 2023-09-18 ENCOUNTER — Ambulatory Visit: Payer: BC Managed Care – PPO | Attending: Family Medicine

## 2023-09-18 DIAGNOSIS — R262 Difficulty in walking, not elsewhere classified: Secondary | ICD-10-CM | POA: Insufficient documentation

## 2023-09-18 DIAGNOSIS — M25671 Stiffness of right ankle, not elsewhere classified: Secondary | ICD-10-CM | POA: Insufficient documentation

## 2023-09-18 DIAGNOSIS — M6281 Muscle weakness (generalized): Secondary | ICD-10-CM | POA: Insufficient documentation

## 2023-09-18 DIAGNOSIS — M25571 Pain in right ankle and joints of right foot: Secondary | ICD-10-CM | POA: Diagnosis not present

## 2023-09-18 NOTE — Therapy (Signed)
 OUTPATIENT PHYSICAL THERAPY TREATMENT   Patient Name: [name] [name] MRN: [id number] DOB:[birth year], 53 y.o., female Today's Date: 09/18/2023   END OF SESSION:  PT End of Session - 09/18/23 1543     Visit Number 3    Date for PT Re-Evaluation 10/21/23    Authorization Type BCBS    Progress Note Due on Visit 10    PT Start Time 1530    PT Stop Time 1613    PT Time Calculation (min) 43 min    Activity Tolerance Patient tolerated treatment well    Behavior During Therapy WFL for tasks assessed/performed               Past Medical History:  Diagnosis Date   Breast cancer (HCC)    Breast cancer (HCC)    CHF (congestive heart failure) (HCC)    Family history of breast cancer    Family history of prostate cancer    GERD (gastroesophageal reflux disease)    during pregnancy    Pneumonia    hx of pneumonia 08/2013    S/P radiation therapy 05/03/2015 through 06/14/2015                                                      Right breast 4680 cGy in 26 sessions, right breast boost 1000 cGy in 5 sessions.  Right supraclavicular/axillary region 4680 cGy with a supplemental PA field to bring the axillary dose up to 4500 cGy in 26 sessions   Past Surgical History:  Procedure Laterality Date   BREAST LUMPECTOMY WITH RADIOACTIVE SEED AND SENTINEL LYMPH NODE BIOPSY Right 03/29/2015   Procedure: BREAST LUMPECTOMY WITH RADIOACTIVE SEED AND SENTINEL LYMPH NODE BIOPSY;  Surgeon: Jina Nephew, MD;  Location: [facility];  Service: General;  Laterality: Right;   CESAREAN SECTION     CHOLECYSTECTOMY     ESSURE TUBAL LIGATION     PORTACATH PLACEMENT Left 07/21/2014   Procedure: INSERTION PORT-A-CATH;  Surgeon: Jina Nephew, MD;  Location: [facility];  Service: General;  Laterality: Left;   WISDOM TOOTH EXTRACTION     Patient Active Problem List   Diagnosis Date Noted   Other fatigue 03/20/2023   Hyperglycemia 03/20/2023   Family history of liver cancer 03/20/2023   Genetic  testing 08/10/2021   Family history of prostate cancer 07/27/2021   Port-A-Cath in place 08/21/2018   Cellulitis of right breast 07/04/2018   Right arm pain 04/09/2017   Sinusitis 07/03/2016   Candidiasis of breast 04/05/2016   Eczema 04/05/2016   Port-A-Cath in place 12/06/2015   Seasonal allergies 10/04/2015   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   Peripheral neuropathy due to chemotherapy (HCC) 03/15/2015   Breast cancer metastasized to lung (HCC) 07/22/2014   Family history of breast cancer 07/22/2014   Lung mas bilaterial 07/22/2014    PCP: Beverlie Crazier, PA   REFERRING PROVIDER: Marcene Benedict ORN, MD   REFERRING DIAG: [phone] (ICD-10-CM) - Peroneal tenosynovitis   THERAPY DIAG:  Pain in right ankle and joints of right foot  Stiffness of right ankle, not elsewhere classified  Difficulty in walking, not elsewhere classified  Muscle weakness (generalized)  RATIONALE FOR EVALUATION AND TREATMENT: Rehabilitation  ONSET DATE: July 12, 2023  NEXT MD VISIT: 09/02/23   SUBJECTIVE:  SUBJECTIVE STATEMENT: Pt overdid it on Sunday, 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   PAIN: Are you having pain? Yes: NPRS scale: 0/10 Pain location: R ankle (moves around sometimes inside, sometimes outside, occasionally in the heel) Pain description: throbbing, pulling, ankle bone hurts (new) Aggravating factors: quick movements especially driving, transitional movements when can't wear boot Relieving factors: CAM boot, elevation, icing   PERTINENT HISTORY:  R Achilles tendinitis 06/2023 progressing to peroneal tenosynovitis, Peripheral neuropathy d/t chemotherapy, CHF, R breast cancer s/p lumpectomy with radioactive seed 03/2015, lung metastasis    PRECAUTIONS: Other: history of Breast CA with neuropathy  RED FLAGS: None  WEIGHT BEARING RESTRICTIONS: No  FALLS:  Has patient fallen in last 6 months? No  LIVING ENVIRONMENT: Lives with: lives with their family Lives in: House/apartment Stairs: Yes: Internal: 16 steps; on left going up Has following equipment at home:  CAM boot  OCCUPATION: Call center  PLOF: Independent and Leisure: treadmill walking - just bought prior to injury  PATIENT GOALS: use treadmill, decrease pain, wear regular shoes  OBJECTIVE: (objective measures completed at initial evaluation unless otherwise dated)  DIAGNOSTIC FINDINGS:  07/22/23 - Limited POCUS posterior ankle (right): Hypoechoic stranding w/o neovascular findings within kager's fat pad. Mild thickening of distal achilles tendon without appreciable fibrillar pattern disruption of tendon. Distal peroneal brevis and longus tenosynovitis spanning landmarks between posterior lateral malleolus to base of 5th MT without signs of fibrillar pattern disruption  PATIENT SURVEYS:  LEFS 22/80  COGNITION: Overall cognitive status: Within functional limits for tasks assessed    SENSATION: Neuropathy from chemo in toes and fingers  EDEMA:  Circumferential: R 57.5 cm, L 57.5 cm    POSTURE:  No Significant postural limitations  PALPATION: Tenderness along R peroneal tendons, achilles insertion R calf.  Crepitus with rear foot joint mobs.   MUSCLE LENGTH: Heelcord: Tightness R   LOWER EXTREMITY ROM:  Passive ROM Right eval Left eval  Knee extension ~ 10 deg hyperextension ~ 10 deg hyperextension  Ankle dorsiflexion 0 10  Ankle plantarflexion    Ankle inversion 30p! 40  Ankle eversion 40p! 40   LOWER EXTREMITY MMT:  MMT Right eval* Left eval*  Hip flexion 5 5  Hip extension    Hip abduction 5 5  Hip adduction 4+ 4+  Knee flexion 5 5  Knee extension 5 5  Ankle dorsiflexion 4+ 5  Ankle plantarflexion 4+ 5  Ankle inversion 4p! 5   Ankle eversion 4p! 5   (Blank rows = not tested) * tested in seated position  LOWER EXTREMITY SPECIAL TESTS:  Ankle special tests: Anterior drawer test: negative, Talar tilt test: negative, and Great toe extension test: negative  FUNCTIONAL TESTS:  Gait speed 1 m/s  GAIT: Distance walked: 31' Assistive device utilized:  CAM boot on R and None Level of assistance: Complete Independence Gait pattern: antalgic Comments: decreased gait speed, antalgic, wearing CAM boot so increased steppage on R   TODAY'S TREATMENT:  09/18/2023 Therapeutic Exercise: to improve strength and mobility.  Demo, verbal and tactile cues throughout for technique. Treadmill x 6 min 2.0 incline 4 way ankle RTB x 20  R runner stretch 2x30 Soleus stretch standing 2x30 Standing hip abduction x 10 bil Standing hip extension x 10 bil 4 way weight shifting on airex  Heel raises standing x 10   1/128/2025 EVAL Therapeutic Exercise: to improve strength and mobility.  Demo, verbal and tactile cues throughout for technique. Treadmill x  6 min 2.0 incline Gastroc stretch 2x30 with strap 4 way ankle YTB x 10  BAPS board: PF/DF/EV/IV circles both ways x 10 each Gait Training review- decreased step length B  08/26/2023 EVAL Therapeutic Exercise: to improve strength and mobility.  Demo, verbal and tactile cues throughout for technique. - Seated Ankle Alphabet  DEMO - Seated Calf Towel Stretch  - 3 reps - 30 sec hold - Seated Ankle Pumps  DEMO Self Care: Findings, POC, recommendations for how to wean out of CAM boot - can remove when sitting, try ankle brace for walking for gentle compression and for driving, if ankle hurts with walking continue to use CAM boot.   PEACE v. RICE    PATIENT EDUCATION:  Education details: HEP Person educated: Patient Education method: Programmer, Multimedia, Demonstration, Verbal cues, and Handouts Education comprehension: verbalized understanding and returned demonstration  HOME EXERCISE  PROGRAM: Access Code: [email or web address] URL: [email or web address] Date: 09/18/2023 Prepared by: Sol Gaskins  Exercises - Long Sitting Ankle Inversion with Resistance  - 1 x daily - 7 x weekly - 2 sets - 10 reps - Long Sitting Ankle Eversion with Resistance  - 1 x daily - 7 x weekly - 2 sets - 10 reps - Long Sitting Ankle Dorsiflexion with Anchored Resistance  - 1 x daily - 7 x weekly - 2 sets - 10 reps - Gastroc Stretch on Wall  - 1 x daily - 7 x weekly - 2 sets - 2 reps - 30 sec hold - Soleus Stretch on Wall  - 1 x daily - 7 x weekly - 2 sets - 2 reps - 30 sec hold - Standing Hip Abduction with Counter Support  - 1 x daily - 7 x weekly - 2 sets - 10 reps - Standing Hip Extension with Counter Support  - 1 x daily - 7 x weekly - 2 sets - 10 reps - Standing Hip Extension with Counter Support  - 1 x daily - 7 x weekly - 2 sets - 10 reps - Standing Heel Raise with Support  - 1 x daily - 7 x weekly - 2 sets - 10 reps   ASSESSMENT:  CLINICAL IMPRESSION: Progressed exercises to tolerance. Worked on more standing interventions to allow for functional strength. Keiyana will benefit from skilled PT to address above deficits to improve mobility and activity tolerance with decreased pain interference.  OBJECTIVE IMPAIRMENTS: Abnormal gait, decreased activity tolerance, decreased balance, decreased endurance, decreased mobility, difficulty walking, decreased ROM, decreased strength, hypomobility, increased fascial restrictions, impaired perceived functional ability, increased muscle spasms, and pain.   ACTIVITY LIMITATIONS: carrying, lifting, bending, sitting, standing, squatting, sleeping, stairs, transfers, bed mobility, and locomotion level  PARTICIPATION LIMITATIONS: meal prep, cleaning, laundry, driving, shopping, community activity, and occupation  PERSONAL FACTORS: Past/current experiences, Time since onset of injury/illness/exacerbation, and 1-2 comorbidities: R Achilles tendinitis  06/2023 progressing to peroneal tenosynovitis, Peripheral neuropathy d/t chemotherapy, CHF, R breast cancer s/p lumpectomy with radioactive seed 03/2015, lung metastasis   are also affecting patient's functional outcome.   REHAB POTENTIAL: Good  CLINICAL DECISION MAKING: Stable/uncomplicated  EVALUATION COMPLEXITY: Low   GOALS: Goals reviewed with patient? Yes  SHORT TERM GOALS: Target date: 09/16/2023  Patient will be independent with initial HEP. Baseline: given Goal status: MET- 09/18/23   LONG TERM GOALS: Target date: 10/21/2023  Patient will be independent with advanced/ongoing HEP to improve outcomes and carryover.  Baseline:  Goal status: IN PROGRESS  2.  Patient will report at least 75% improvement in Right  ankle/foot pain to improve QOL. Baseline: 5-8/10 Goal status: IN PROGRESS- 09/18/23 50%  3.  Patient will demonstrate improved R ankle AROM to [facility] to allow for normal gait and stair mechanics. Baseline: see objective Goal status: IN PROGRESS  4.  Patient will demonstrate improved R ankle strength to 5/5 without pain for improved stability and ease of mobility. Baseline: see objective Goal status: IN PROGRESS  5.  Patient will be able to ambulate 600' without AD with normal gait pattern without increased foot/ankle pain to access community.  Baseline: antalgic, walking with CAM boot Goal status: IN PROGRESS  6. Patient will be able to ascend/descend stairs with 1 HR and reciprocal step pattern safely without pain to access home and community.  Baseline: having difficulty on stairs at home Goal status: IN PROGRESS  7.  Patient will report >/= 31/80 on LEFS (MCID = 9 pts) to demonstrate improved functional ability. Baseline: 22/80 Goal status: IN PROGRESS   PLAN:  PT FREQUENCY: 1-2x/week  PT DURATION: 8 weeks  PLANNED INTERVENTIONS: 97110-Therapeutic exercises, 97530- Therapeutic activity, 97112- Neuromuscular re-education, 97535- Self Care, 02859- Manual  therapy, [phone]- Gait training, [phone]- Orthotic Fit/training, [phone]- Vasopneumatic device, [id number]- Ultrasound, [id number]- Ionotophoresis 4mg /ml Dexamethasone , Patient/Family education, Balance training, Stair training, Taping, Dry Needling, Joint mobilization, Joint manipulation, Cryotherapy, and Moist heat  PLAN FOR NEXT SESSION: try taping for tendonitis, continue to progress ankle strengthening - Tband exercises, BAPS board to start, manual therapy, modalities PRN - not interested in [employer], PTA,  09/18/2023, 4:19 PM

## 2023-09-23 ENCOUNTER — Other Ambulatory Visit: Payer: Self-pay

## 2023-09-23 NOTE — Progress Notes (Signed)
Patient Care Team: [name], [name] as PCP - General (Family Medicine) Dorris Singh, DO (Family Medicine) Almond Lint, MD as Consulting Physician (General Surgery) Malachy Mood, MD as Consulting Physician (Hematology) Magda Kiel, MD (Obstetrics and Gynecology) Gala Romney, Bevelyn Buckles, MD as Consulting Physician (Cardiology)  Clinic Day:  09/24/2023  Referring physician: [name], PA  ASSESSMENT & PLAN:   Assessment & Plan: Breast cancer metastasized to lung T3N1M1, stage IV, ER-/PR-/HER2+, ypT60miN0, NED -diagnosed 06/2014 with metastatic disease, s/p neoadjuvant chemo, right lumpectomy, adjuvant radiation and currently on Maintenance Herceptin since 04/12/15. Her restaging scan has showed NED -10/17/2022 - CTA chest - negative for PE or thoracic aortic dissection. No acute findings. There is subpleural scarring of anterior right upper lobe. No new nodules or new disease noted.  -10/17/2022 - CT abdomen and pelvis - colonic diverticulosis. Right renal stone without hydronephrosis.  -new restaging CT CAP due 10/2023.  -She has been on maintenance Herceptin for about 7.5 years.  She is probably cured, although I am not able to approve it. Will continue Herceptin for now, I would consider stopping therapy after 10 years -she continues to tolerate herceptin infusions well.  -03/20/2023 - patient complains of moderate to severe fatigue. She is unsure if this fatigue is similar to fatigue she has previously experienced related to chemotherapy treatments, or if this fatigue is worse. It has not been intrusive yet, but she feels like it will become intrusive soon. Increased concern because her aunt, also a breast cancer survivor, was diagnosed with liver cancer at the same time the patient was diagnosed with breast cancer, in 2015. The patient states that her aunt's only complaint was fatigue.  -reviewed CTA chest and CT abdomen and pelvis with the patient, done 10/2022. There was no evidence  of metastatic or new disease noted.  -repeated CT CAP in 03/2023 was negative for recurrence, echo in 04/2023 showed normal EF   -Mammogram done 08/02/2023 with negative -New echocardiogram should be done March 2025 -Repeat CT CAP in June 2025 -Continue maintenance Herceptin every 28 days.   Plan:  Labs reviewed  -CBC showing WBC 7.2; Hgb 13.2; Hct 39.1; Plt 241; Anc 4.4 -CMP - K 3.6; glucose 122; BUN 21; Creatinine 1.00; eGFR >60; Ca 9.5; LFTs normal.   -Start doxycycline 100 mg twice daily for next 10 days for suspected cellulitis of right breast. --Advised gentle cleansing daily with warm water and Dial soap.  Pat dry.  May use heating pad on low setting to help with discomfort. -Stat order for diagnostic mammogram of right breast -Stat order for right breast ultrasound for further evaluation.  History of right side breast cancer. -Advised patient to take home test for COVID-19.  If positive, will place on appropriate isolation precautions for treatment with Herceptin in 1 week.  Will potentially need to postpone treatment due to necessary isolation precautions.  Will reevaluate if COVID 19 test is positive.  She should follow employer protocols for positive COVID-19 testing if test is positive. -She should continue to take Tylenol and/or ibuprofen to help with pain and fever. The patient understands the plans discussed today and is in agreement with them.  She knows to contact our office if she develops concerns prior to her next appointment.  I provided 30 minutes of face-to-face time during this encounter and > 50% was spent counseling as documented under my assessment and plan.    Carlean Jews, NP  [facility] -  A DEPT OF [facility] [address] [place] [address] Dept: [phone] Dept Fax: [phone]   Orders Placed This Encounter  Procedures   MM DIAG BREAST TOMO UNI RIGHT    Standing Status:    Future    Expected Date:   09/24/2023    Expiration Date:   09/23/2024    Reason for Exam (SYMPTOM  OR DIAGNOSIS REQUIRED):   right breast redness, swelling, and tenderness. history of right breast cancer    Is the patient pregnant?:   No    Preferred imaging location?:   External             [facility]   MS US BREAST LTD UNI RIGHT INC AXILLA    Patient goes to [facility] mammography    Standing Status:   Future    Expected Date:   09/24/2023    Expiration Date:   09/23/2024    Reason for Exam (SYMPTOM  OR DIAGNOSIS REQUIRED):   right breast redness, swelling, and tenderness. history of right breast cancer    Preferred imaging location?:   External      CHIEF COMPLAINT:  CC: Right breast cancer metastatic to lung  Current Treatment: Trastuzumab injection every 28 days  INTERVAL HISTORY:  [name] [name] is here today for repeat clinical assessment.  She was last seen by Dr. Mosetta Putt on 06/11/2023.  Most recent mammogram, 08/05/2023, was negative.  Next CT CAP to be done in June 2025.  Patient states that on Saturday she had generalized body aches, fever, and chills.  Had night sweats.  She also reports episode of dizziness which caused nausea and vomiting.  Has had intermittent diarrhea since Saturday.  She states on Sunday when she woke up, most of the prodromal symptoms had subsided.  But right breast became very red and tender.  Breast is swollen.  It is tender to even light touch with clothing.  Unable to wear a bra or compression Cami.  She has noticed some hardening of the tissue around her areola.  She also states she has noticed some reddening and splotches around the upper breast on the right side.  Today, she reports feeling continued lightheadedness.  She has not tested for COVID or flu.  She has not noticed any discrete lump or mass at this point.    I have reviewed the past medical history, past surgical history, social history and family history with the patient and they are unchanged from  previous note.  ALLERGIES:  is allergic to adhesive [tape], aspirin, and codeine.  MEDICATIONS:  Current Outpatient Medications  Medication Sig Dispense Refill   doxycycline (VIBRAMYCIN) 100 MG capsule Take 1 capsule (100 mg total) by mouth 2 (two) times daily. 20 capsule 0   fluconazole (DIFLUCAN) 150 MG tablet Take 1 tablet po once for yeast infection. May repeat dose in 3 days for persistent symptoms 3 tablet 1   acetaminophen (TYLENOL) 500 MG tablet Take 500 mg by mouth every 6 (six) hours as needed.     Ascorbic Acid (VITAMIN C) 500 MG CHEW      benzonatate (TESSALON) 200 MG capsule Take 1 capsule (200 mg total) by mouth 3 (three) times daily as needed for cough. 20 capsule 0   Calcium-Phosphorus-Vitamin D (CALCIUM GUMMIES PO) Take by mouth daily.     Camphor-Eucalyptus-Menthol (VICKS VAPORUB EX) Apply 1 application. topically at bedtime. Applies under nose, throat and on chest as needed     celecoxib (CELEBREX) 100 MG  capsule Take 1 capsule (100 mg total) by mouth 2 (two) times daily. 60 capsule 0   cetirizine (ZYRTEC) 10 MG tablet Take 10 mg by mouth daily as needed for allergies (allergies).      fluticasone (FLONASE) 50 MCG/ACT nasal spray Place into both nostrils daily.     gabapentin (NEURONTIN) 100 MG capsule TAKE 2 CAPSULES(200 MG) BY MOUTH AT BEDTIME 60 capsule 1   hydrocortisone 2.5 % cream Apply 1 application topically 2 (two) times daily as needed.     ibuprofen (ADVIL,MOTRIN) 800 MG tablet Take 1 tablet (800 mg total) by mouth every 8 (eight) hours as needed. 60 tablet 1   lidocaine-prilocaine (EMLA) cream APPLY TO THE AFFECTED AREA PRIOR TO CHEMOTHERAPY AS DIRECTED 30 g 2   losartan (COZAAR) 25 MG tablet Take 1 tablet (25 mg total) by mouth daily. 90 tablet 1   meloxicam (MOBIC) 7.5 MG tablet Take 7.5 mg by mouth daily.     Multiple Vitamins-Minerals (MULTIVITAMIN GUMMIES ADULT PO) Take 1 each by mouth daily. Women's Vitafusion gummie     spironolactone (ALDACTONE) 25 MG  tablet TAKE 1 TABLET(25 MG) BY MOUTH DAILY 30 tablet 0   No current facility-administered medications for this visit.   Facility-Administered Medications Ordered in Other Visits  Medication Dose Route Frequency Provider Last Rate Last Admin   sodium chloride 0.9 % injection 10 mL  10 mL Intravenous PRN Malachy Mood, MD       sodium chloride flush (NS) 0.9 % injection 10 mL  10 mL Intravenous PRN Malachy Mood, MD        HISTORY OF PRESENT ILLNESS:   Oncology History Overview Note  Breast cancer metastasized to lung   Staging form: Breast, AJCC 7th Edition     Clinical stage from 07/22/2014: Stage IV (T3, N1, M1) - Unsigned     Breast cancer metastasized to lung (HCC)  07/02/2014 Mammogram   Mammogram showed a 2cm right beast mass and a 1.8cm right axillary node. MRI breast on 07/16/2014 showed 7cm R breast lesion and 4.4cm r axillary node    07/02/2014 Imaging   CT CAP: a 4.7cm mass in LUL lung and a 2.1cm mas in RML, and a small nodule in RUL, suspecious for metastasis     07/09/2014 Initial Diagnosis   right IDA with b/l lung lesions, ER-/PR-/HER2+   07/09/2014 Initial Biopsy   US guided right breast mass and axillary node biopsy showed IDA, and DCIS, ER-/PR-/HER2+   07/26/2014 Pathologic Stage   Left lung mass by IR, path revealed high grade carcinoma, morphology similar to breast tumor biopsy, TTF(-), NapsinA(-), ER(-)   08/04/2014 - 03/22/2015 Chemotherapy   weekly Paclitaxel 80mg /m2, trastuzumab and pertuzumab every 3 weeks   08/30/2014 Genetic Testing   BreastNext panel was negative. 17 genes including BRCA1, BRCA2, were negative for mutations.    10/04/2014 Imaging   Interval decrease in the right axillary lymphadenopathy. Bilateral pulmonary lesions with left hilar lymphadenopathy also markedly decreased in the interval. The left hilar lymphadenopathy has resolved.   12/20/2014 Imaging   restaging CT showed stable disease, no new lesions    03/29/2015 Pathology Results     right breast lumpectomy showed  chemotherapy treatment effect,  a 1 mm residual tumor,   margins were widely negative, 5 sentinel lymph nodes and 2 axillary lymph nodes were negative.    03/29/2015 Surgery    right breast lumpectomy and sentinel lymph node biopsy  by Dr. Donell Beers   04/12/2015 -  Chemotherapy  Herceptin maintenance therapy , 6 mg/kg, every 3 weeks since 04/12/15. Switched to Herceptin Hylecta injection q3weeks after 06/10/19.   04/12/2015 - 10/04/2022 Chemotherapy   Patient is on Treatment Plan : BREAST Trastuzumab q21d     05/03/2015 - 06/14/2015 Radiation Therapy   right breast adjuvant irradiation by Dr. Dayton Scrape    08/28/2016 Imaging   CT CAP w Contrast  IMPRESSION: 1. No acute process or evidence of metastatic disease within the chest, abdomen, or pelvis. 2. Similar to less well-defined left upper lobe density, likely scarring. No evidence of new or progressive pulmonary metastasis. 3. Right nephrolithiasis.   01/22/2017 Imaging   CT Chest W Contrast 01/22/17 IMPRESSION: 1. Stable exam. No new or progressive findings. No evidence for metastatic disease. 2. Left upper lobe architectural distortion/scarring is stable. 3. Nonobstructing right renal stone.   07/31/2017 Imaging   Bone Scan Whole Body 07/31/17  IMPRESSION: 1. No scintigraphic evidence of osseous metastatic disease. 2. Thoracolumbar scoliosis.    07/31/2017 Imaging   CT CAP W Contrast 07/31/17 IMPRESSION: 1. No findings of active or recurrent malignancy. 2. Other imaging findings of potential clinical significance: Aortic Atherosclerosis (ICD10-I70.0). Mild cardiomegaly. Postoperative and radiation therapy findings in the right chest. Nonobstructive right nephrolithiasis. Thoracolumbar scoliosis.     01/20/2018 Echocardiogram   ECHO 01/20/2018 Study Conclusions   - Left ventricle: The cavity size was normal. Wall thickness was   normal. Systolic function was normal. The estimated ejection    fraction was in the range of 55% to 60%. Wall motion was normal;   there were no regional wall motion abnormalities. Doppler   parameters are consistent with abnormal left ventricular   relaxation (grade 1 diastolic dysfunction). - Impressions: GLS -20.7% LS&' 10.4 cm.    02/17/2018 Imaging   02/17/2018 CT CAP IMPRESSION: 1. No evidence for residual or recurrent tumor or metastatic disease. 2.  Aortic Atherosclerosis (ICD10-I70.0). 3. Right renal calculi. 4. Scoliosis.   08/11/2018 Imaging   CT CAP W Contrast 08/11/18  IMPRESSION: No evidence for localized recurrence or metastatic disease in the chest, abdomen or pelvis.   02/23/2019 Imaging   CT CAP W Contrast  IMPRESSION: 1. No findings of active malignancy in the chest, abdomen, or pelvis. 2. 3 mm right kidney lower pole nonobstructive renal calculus. 3. Thoracolumbar scoliosis.   12/08/2019 Imaging   CT CAP W contrast  IMPRESSION: 1. Unchanged post treatment appearance of the lungs. No evidence of recurrent or new metastatic disease in the chest, abdomen, or pelvis.   2. Postoperative findings of right lumpectomy and axillary lymph node dissection.   3.  Nonobstructive right nephrolithiasis.   11/17/2020 Imaging   CT CAP IMPRESSION: 1. No evidence of recurrence or new metastatic disease within the chest, abdomen, or pelvis. 2. Similar post treatment changes in the lungs and postoperative findings of right lumpectomy and axillary lymph node dissection. 3. Colonic diverticulosis without findings of acute diverticulitis.   08/10/2021 Genetic Testing   Negative genetic testing on the CancerNext-Expanded+RNAinsight panel.  The report date is August 10, 2021.  The CancerNext-Expanded gene panel offered by [facility] and includes sequencing and rearrangement analysis for the following 77 genes: AIP, ALK, APC*, ATM*, AXIN2, BAP1, BARD1, BLM, BMPR1A, BRCA1*, BRCA2*, BRIP1*, CDC73, CDH1*, CDK4, CDKN1B, CDKN2A, CHEK2*,  CTNNA1, DICER1, FANCC, FH, FLCN, GALNT12, KIF1B, LZTR1, MAX, MEN1, MET, MLH1*, MSH2*, MSH3, MSH6*, MUTYH*, NBN, NF1*, NF2, NTHL1, PALB2*, PHOX2B, PMS2*, POT1, PRKAR1A, PTCH1, PTEN*, RAD51C*, RAD51D*, RB1, RECQL, RET, SDHA, SDHAF2, SDHB, SDHC, SDHD, SMAD4, SMARCA4,  SMARCB1, SMARCE1, STK11, SUFU, TMEM127, TP53*, TSC1, TSC2, VHL and XRCC2 (sequencing and deletion/duplication); EGFR, EGLN1, HOXB13, KIT, MITF, PDGFRA, POLD1, and POLE (sequencing only); EPCAM and GREM1 (deletion/duplication only). DNA and RNA analyses performed for * genes.    10/17/2022 Imaging   CTA Chest and CT abdomen and pelvis with contrast  IMPRESSION: 1. Negative for acute PE or thoracic aortic dissection. 2. No acute findings. 3. Colonic diverticulosis. 4. Right nephrolithiasis without hydronephrosis.   10/25/2022 - 10/25/2022 Chemotherapy   Patient is on Treatment Plan : BREAST MAINTENANCE Trastuzumab IV (6) or SQ (600) D1 q21d X 11 Cycles     10/25/2022 -  Chemotherapy   Patient is on Treatment Plan : BREAST MAINTENANCE Trastuzumab IV (6) or SQ (600) D1 q21d x 13 cycles         REVIEW OF SYSTEMS:   Constitutional: Fever, chills, body aches, and night sweats which started on Saturday.  The symptoms mostly resolved on Sunday.  Still feels lightheaded intermittently. Eyes: Denies blurriness of vision Ears, nose, mouth, throat, and face: Denies mucositis or sore throat Respiratory: Reports mild cough and congestion since Sunday. Cardiovascular: Denies palpitation, chest discomfort or lower extremity swelling Gastrointestinal: Had nausea with vomiting on Saturday.  She also had several episodes of diarrhea. Skin: Denies abnormal skin rashes Lymphatics: Denies new lymphadenopathy or easy bruising Neurological:Denies numbness, tingling or new weaknesses.  She reports feeling very dizzy on Saturday.  Continues to have intermittent episodes of "lightheadedness." Behavioral/Psych: Mood is stable, no new changes  All other  systems were reviewed with the patient and are negative.   VITALS:   Today's Vitals   09/24/23 0904 09/24/23 0905  BP: (!) 158/92 (!) 140/84  Pulse: 96   Resp: 15   Temp: (!) 97.3 F (36.3 C)   TempSrc: Temporal   SpO2: 99%   Weight: 231 lb 9.6 oz (105.1 kg)    Body mass index is 33.23 kg/m.   Wt Readings from Last 3 Encounters:  09/24/23 231 lb 9.6 oz (105.1 kg)  09/02/23 235 lb 1.9 oz (106.6 kg)  09/02/23 237 lb (107.5 kg)    Body mass index is 33.23 kg/m.  Performance status (ECOG): 1 - Symptomatic but completely ambulatory  PHYSICAL EXAM:   GENERAL:alert, no distress and comfortable SKIN: skin color, texture, turgor are normal, no rashes or significant lesions EYES: normal, Conjunctiva are pink and non-injected, sclera clear OROPHARYNX:no exudate, no erythema and lips, buccal mucosa, and tongue normal  NECK: supple, thyroid normal size, non-tender, without nodularity LYMPH:  no palpable lymphadenopathy in the cervical, axillary or inguinal LUNGS: clear to auscultation and percussion with normal breathing effort HEART: regular rate & rhythm and no murmurs and no lower extremity edema ABDOMEN:abdomen soft, non-tender and normal bowel sounds Musculoskeletal:no cyanosis of digits and no clubbing  BREAST: Right breast is reddened, warm, and very tender to palpate.  Focal concern just inferior to right areola.  Evidence of nipple discharge with white crusted material around right nipple.  Some redness also noted in upper inner quadrant of the right breast.  Right breast swollen in general.  There is no right axillary lymphadenopathy at this time.  There are no palpable masses or lumps in the left breast.  No nipple inversion or evidence of discharge.  There is notable tenderness with palpation.  There is no axillary lymphadenopathy on the left side. NEURO: alert & oriented x 3 with fluent speech, no focal motor/sensory deficits  LABORATORY DATA:  I have reviewed the data  as  listed    Component Value Date/Time   NA 136 09/24/2023 0850   NA 141 08/01/2017 1305   K 3.6 09/24/2023 0850   K 3.6 08/01/2017 1305   CL 101 09/24/2023 0850   CO2 28 09/24/2023 0850   CO2 27 08/01/2017 1305   GLUCOSE 122 (H) 09/24/2023 0850   GLUCOSE 108 08/01/2017 1305   BUN 21 (H) 09/24/2023 0850   BUN 10.5 08/01/2017 1305   CREATININE 1.00 09/24/2023 0850   CREATININE 0.9 08/01/2017 1305   CALCIUM 9.5 09/24/2023 0850   CALCIUM 9.4 08/01/2017 1305   PROT 8.5 (H) 09/24/2023 0850   PROT 7.6 08/01/2017 1305   ALBUMIN 4.2 09/24/2023 0850   ALBUMIN 3.6 08/01/2017 1305   AST 34 09/24/2023 0850   AST 21 08/01/2017 1305   ALT 37 09/24/2023 0850   ALT 26 08/01/2017 1305   ALKPHOS 78 09/24/2023 0850   ALKPHOS 73 08/01/2017 1305   BILITOT 0.4 09/24/2023 0850   BILITOT 0.25 08/01/2017 1305   GFRNONAA >60 09/24/2023 0850   GFRAA >60 03/30/2020 1343     Lab Results  Component Value Date   WBC 7.2 09/24/2023   NEUTROABS 4.4 09/24/2023   HGB 13.2 09/24/2023   HCT 39.1 09/24/2023   MCV 88.9 09/24/2023   PLT 241 09/24/2023

## 2023-09-23 NOTE — Assessment & Plan Note (Signed)
 T3N1M1, stage IV, ER-/PR-/HER2+, ypT19miN0, NED -diagnosed 06/2014 with metastatic disease, s/p neoadjuvant chemo, right lumpectomy, adjuvant radiation and currently on Maintenance Herceptin  since 04/12/15. Her restaging scan has showed NED -10/17/2022 - CTA chest - negative for PE or thoracic aortic dissection. No acute findings. There is subpleural scarring of anterior right upper lobe. No new nodules or new disease noted.  -10/17/2022 - CT abdomen and pelvis - colonic diverticulosis. Right renal stone without hydronephrosis.  -new restaging CT CAP due 10/2023.  -She has been on maintenance Herceptin  for about 7.5 years.  She is probably cured, although I am not able to approve it. Will continue Herceptin  for now, I would consider stopping therapy after 10 years -she continues to tolerate herceptin  infusions well.  -03/20/2023 - patient complains of moderate to severe fatigue. She is unsure if this fatigue is similar to fatigue she has previously experienced related to chemotherapy treatments, or if this fatigue is worse. It has not been intrusive yet, but she feels like it will become intrusive soon. Increased concern because her aunt, also a breast cancer survivor, was diagnosed with liver cancer at the same time the patient was diagnosed with breast cancer, in 2015. The patient states that her aunt's only complaint was fatigue.  -reviewed CTA chest and CT abdomen and pelvis with the patient, done 10/2022. There was no evidence of metastatic or new disease noted.  -repeated CT CAP in 03/2023 was negative for recurrence, echo in 04/2023 showed normal EF   -Mammogram done 08/02/2023 with negative -New echocardiogram should be done March 2025 -Repeat CT CAP in June 2025 -Continue maintenance Herceptin  every 28 days.

## 2023-09-24 ENCOUNTER — Ambulatory Visit: Payer: BC Managed Care – PPO

## 2023-09-24 ENCOUNTER — Inpatient Hospital Stay: Payer: BC Managed Care – PPO

## 2023-09-24 ENCOUNTER — Encounter: Payer: Self-pay | Admitting: Nurse Practitioner

## 2023-09-24 ENCOUNTER — Inpatient Hospital Stay: Payer: BC Managed Care – PPO | Attending: Hematology | Admitting: Nurse Practitioner

## 2023-09-24 VITALS — BP 140/84 | HR 96 | Temp 97.3°F | Resp 15 | Wt 231.6 lb

## 2023-09-24 DIAGNOSIS — Z95828 Presence of other vascular implants and grafts: Secondary | ICD-10-CM

## 2023-09-24 DIAGNOSIS — R262 Difficulty in walking, not elsewhere classified: Secondary | ICD-10-CM

## 2023-09-24 DIAGNOSIS — C7800 Secondary malignant neoplasm of unspecified lung: Secondary | ICD-10-CM | POA: Diagnosis not present

## 2023-09-24 DIAGNOSIS — Z171 Estrogen receptor negative status [ER-]: Secondary | ICD-10-CM | POA: Diagnosis not present

## 2023-09-24 DIAGNOSIS — M6281 Muscle weakness (generalized): Secondary | ICD-10-CM

## 2023-09-24 DIAGNOSIS — C50911 Malignant neoplasm of unspecified site of right female breast: Secondary | ICD-10-CM

## 2023-09-24 DIAGNOSIS — M25571 Pain in right ankle and joints of right foot: Secondary | ICD-10-CM | POA: Diagnosis not present

## 2023-09-24 DIAGNOSIS — Z5112 Encounter for antineoplastic immunotherapy: Secondary | ICD-10-CM | POA: Diagnosis not present

## 2023-09-24 DIAGNOSIS — M25671 Stiffness of right ankle, not elsewhere classified: Secondary | ICD-10-CM | POA: Diagnosis not present

## 2023-09-24 LAB — CBC WITH DIFFERENTIAL (CANCER CENTER ONLY)
Abs Immature Granulocytes: 0.03 10*3/uL (ref 0.00–0.07)
Basophils Absolute: 0 10*3/uL (ref 0.0–0.1)
Basophils Relative: 1 %
Eosinophils Absolute: 0.1 10*3/uL (ref 0.0–0.5)
Eosinophils Relative: 1 %
HCT: 39.1 % (ref 36.0–46.0)
Hemoglobin: 13.2 g/dL (ref 12.0–15.0)
Immature Granulocytes: 0 %
Lymphocytes Relative: 26 %
Lymphs Abs: 1.9 10*3/uL (ref 0.7–4.0)
MCH: 30 pg (ref 26.0–34.0)
MCHC: 33.8 g/dL (ref 30.0–36.0)
MCV: 88.9 fL (ref 80.0–100.0)
Monocytes Absolute: 0.8 10*3/uL (ref 0.1–1.0)
Monocytes Relative: 11 %
Neutro Abs: 4.4 10*3/uL (ref 1.7–7.7)
Neutrophils Relative %: 61 %
Platelet Count: 241 10*3/uL (ref 150–400)
RBC: 4.4 MIL/uL (ref 3.87–5.11)
RDW: 12.9 % (ref 11.5–15.5)
WBC Count: 7.2 10*3/uL (ref 4.0–10.5)
nRBC: 0 % (ref 0.0–0.2)

## 2023-09-24 LAB — CMP (CANCER CENTER ONLY)
ALT: 37 U/L (ref 0–44)
AST: 34 U/L (ref 15–41)
Albumin: 4.2 g/dL (ref 3.5–5.0)
Alkaline Phosphatase: 78 U/L (ref 38–126)
Anion gap: 7 (ref 5–15)
BUN: 21 mg/dL — ABNORMAL HIGH (ref 6–20)
CO2: 28 mmol/L (ref 22–32)
Calcium: 9.5 mg/dL (ref 8.9–10.3)
Chloride: 101 mmol/L (ref 98–111)
Creatinine: 1 mg/dL (ref 0.44–1.00)
GFR, Estimated: >60 mL/min (ref >60)
Glucose, Bld: 122 mg/dL — ABNORMAL HIGH (ref 70–99)
Potassium: 3.6 mmol/L (ref 3.5–5.1)
Sodium: 136 mmol/L (ref 135–145)
Total Bilirubin: 0.4 mg/dL (ref 0.0–1.2)
Total Protein: 8.5 g/dL — ABNORMAL HIGH (ref 6.5–8.1)

## 2023-09-24 MED ORDER — HEPARIN SOD (PORK) LOCK FLUSH 100 UNIT/ML IV SOLN
500.0000 [IU] | Freq: Once | INTRAVENOUS | Status: AC | PRN
  Administered 2023-09-24: 500 [IU] via INTRAVENOUS

## 2023-09-24 MED ORDER — SODIUM CHLORIDE 0.9% FLUSH
10.0000 mL | Freq: Once | INTRAVENOUS | Status: AC
  Administered 2023-09-24: 10 mL

## 2023-09-24 MED ORDER — DOXYCYCLINE HYCLATE 100 MG PO CAPS: 100.0000 mg | ORAL_CAPSULE | Freq: Two times a day (BID) | ORAL | 0 refills | Status: DC

## 2023-09-24 MED ORDER — FLUCONAZOLE 150 MG PO TABS: ORAL_TABLET | ORAL | 1 refills | Status: DC

## 2023-09-24 NOTE — Therapy (Signed)
OUTPATIENT PHYSICAL THERAPY TREATMENT   Patient Name: [name] [name] MRN: [id number] DOB:03-05-1971, 53 y.o., female Today's Date: 09/24/2023   END OF SESSION:  PT End of Session - 09/24/23 1610     Visit Number 4    Date for PT Re-Evaluation 10/21/23    Authorization Type [employer]    Progress Note Due on Visit 10    PT Start Time 1533    PT Stop Time 1600    PT Time Calculation (min) 27 min    Activity Tolerance Patient tolerated treatment well    Behavior During Therapy WFL for tasks assessed/performed                Past Medical History:  Diagnosis Date   Breast cancer (HCC)    Breast cancer (HCC)    CHF (congestive heart failure) (HCC)    Family history of breast cancer    Family history of prostate cancer    GERD (gastroesophageal reflux disease)    during pregnancy    Pneumonia    hx of pneumonia 08/2013    S/P radiation therapy 05/03/2015 through 06/14/2015                                                      Right breast 4680 cGy in 26 sessions, right breast boost 1000 cGy in 5 sessions.  Right supraclavicular/axillary region 4680 cGy with a supplemental PA field to bring the axillary dose up to 4500 cGy in 26 sessions   Past Surgical History:  Procedure Laterality Date   BREAST LUMPECTOMY WITH RADIOACTIVE SEED AND SENTINEL LYMPH NODE BIOPSY Right 03/29/2015   Procedure: BREAST LUMPECTOMY WITH RADIOACTIVE SEED AND SENTINEL LYMPH NODE BIOPSY;  Surgeon: Almond Lint, MD;  Location: [facility];  Service: General;  Laterality: Right;   CESAREAN SECTION     CHOLECYSTECTOMY     ESSURE TUBAL LIGATION     PORTACATH PLACEMENT Left 07/21/2014   Procedure: INSERTION PORT-A-CATH;  Surgeon: Almond Lint, MD;  Location: [facility];  Service: General;  Laterality: Left;   WISDOM TOOTH EXTRACTION     Patient Active Problem List   Diagnosis Date Noted   Other fatigue 03/20/2023   Hyperglycemia 03/20/2023   Family history of liver cancer 03/20/2023   Genetic  testing 08/10/2021   Family history of prostate cancer 07/27/2021   Port-A-Cath in place 08/21/2018   Cellulitis of right breast 07/04/2018   Right arm pain 04/09/2017   Sinusitis 07/03/2016   Candidiasis of breast 04/05/2016   Eczema 04/05/2016   Port-A-Cath in place 12/06/2015   Seasonal allergies 10/04/2015   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   Peripheral neuropathy due to chemotherapy (HCC) 03/15/2015   Breast cancer metastasized to lung (HCC) 07/22/2014   Family history of breast cancer 07/22/2014   Lung mas bilaterial 07/22/2014    PCP: [name], PA   REFERRING PROVIDER: Burna Forts, MD   REFERRING DIAG: [phone] (ICD-10-CM) - Peroneal tenosynovitis   THERAPY DIAG:  Pain in right ankle and joints of right foot  Stiffness of right ankle, not elsewhere classified  Difficulty in walking, not elsewhere classified  Muscle weakness (generalized)  RATIONALE FOR EVALUATION AND TREATMENT: Rehabilitation  ONSET DATE: July 12, 2023  NEXT MD VISIT: 09/02/23   SUBJECTIVE:  SUBJECTIVE STATEMENT: Pt had been sick over the weekend,   PAIN: Are you having pain? Yes: NPRS scale: 0/10 Pain location: R ankle (moves around sometimes inside, sometimes outside, occasionally in the heel) Pain description: throbbing, pulling, ankle bone hurts (new) Aggravating factors: quick movements especially driving, transitional movements when can't wear boot Relieving factors: CAM boot, elevation, icing   PERTINENT HISTORY:  R Achilles tendinitis 06/2023 progressing to peroneal tenosynovitis, Peripheral neuropathy d/t chemotherapy, CHF, R breast cancer s/p lumpectomy with radioactive seed 03/2015, lung metastasis   PRECAUTIONS: Other: history of Breast CA with neuropathy  RED  FLAGS: None  WEIGHT BEARING RESTRICTIONS: No  FALLS:  Has patient fallen in last 6 months? No  LIVING ENVIRONMENT: Lives with: lives with their family Lives in: House/apartment Stairs: Yes: Internal: 16 steps; on left going up Has following equipment at home:  CAM boot  OCCUPATION: Call center  PLOF: Independent and Leisure: treadmill walking - just bought prior to injury  PATIENT GOALS: use treadmill, decrease pain, wear regular shoes  OBJECTIVE: (objective measures completed at initial evaluation unless otherwise dated)  DIAGNOSTIC FINDINGS:  07/22/23 - Limited POCUS posterior ankle (right): Hypoechoic stranding w/o neovascular findings within kager's fat pad. Mild thickening of distal achilles tendon without appreciable fibrillar pattern disruption of tendon. Distal peroneal brevis and longus tenosynovitis spanning landmarks between posterior lateral malleolus to base of 5th MT without signs of fibrillar pattern disruption  PATIENT SURVEYS:  LEFS 22/80  COGNITION: Overall cognitive status: Within functional limits for tasks assessed    SENSATION: Neuropathy from chemo in toes and fingers  EDEMA:  Circumferential: R 57.5 cm, L 57.5 cm    POSTURE:  No Significant postural limitations  PALPATION: Tenderness along R peroneal tendons, achilles insertion R calf.  Crepitus with rear foot joint mobs.   MUSCLE LENGTH: Heelcord: Tightness R   LOWER EXTREMITY ROM:  Passive ROM Right eval Left eval  Knee extension ~ 10 deg hyperextension ~ 10 deg hyperextension  Ankle dorsiflexion 0 10  Ankle plantarflexion    Ankle inversion 30p! 40  Ankle eversion 40p! 40   LOWER EXTREMITY MMT:  MMT Right eval* Left eval* R 09/24/23  Hip flexion 5 5   Hip extension     Hip abduction 5 5   Hip adduction 4+ 4+   Knee flexion 5 5   Knee extension 5 5   Ankle dorsiflexion 4+ 5 5  Ankle plantarflexion 4+ 5   Ankle inversion 4p! 5 4+  Ankle eversion 4p! 5 4+   (Blank rows = not  tested) * tested in seated position  LOWER EXTREMITY SPECIAL TESTS:  Ankle special tests: Anterior drawer test: negative, Talar tilt test: negative, and Great toe extension test: negative  FUNCTIONAL TESTS:  Gait speed 1 m/s  GAIT: Distance walked: 42' Assistive device utilized:  CAM boot on R and None Level of assistance: Complete Independence Gait pattern: antalgic Comments: decreased gait speed, antalgic, wearing CAM boot so increased steppage on R   TODAY'S TREATMENT:  09/24/2023 Therapeutic Exercise: to improve strength and mobility.  Demo, verbal and tactile cues throughout for technique. Standing hip abduction 2x10 bil Standing hip extension 2x10 bil Standing gastroc stretch 2x30" Standing soleus stretch x 30" LE strength test  09/18/2023 Therapeutic Exercise: to improve strength and mobility.  Demo, verbal and tactile cues throughout for technique. Treadmill x 6 min 2.0 incline 4 way ankle RTB x 20  R runner stretch 2x30" Soleus stretch standing 2x30" Standing hip abduction x 10 bil  Standing hip extension x 10 bil 4 way weight shifting on airex  Heel raises standing x 10   1/128/2025 EVAL Therapeutic Exercise: to improve strength and mobility.  Demo, verbal and tactile cues throughout for technique. Treadmill x 6 min 2.0 incline Gastroc stretch 2x30" with strap 4 way ankle YTB x 10  BAPS board: PF/DF/EV/IV circles both ways x 10 each Gait Training review- decreased step length B  08/26/2023 EVAL Therapeutic Exercise: to improve strength and mobility.  Demo, verbal and tactile cues throughout for technique. - Seated Ankle Alphabet  DEMO - Seated Calf Towel Stretch  - 3 reps - 30 sec hold - Seated Ankle Pumps  DEMO Self Care: Findings, POC, recommendations for how to wean out of CAM boot - can remove when sitting, try ankle brace for walking for gentle compression and for driving, if ankle hurts with walking continue to use CAM boot.   PEACE v. RICE    PATIENT  EDUCATION:  Education details: HEP Person educated: Patient Education method: Programmer, multimedia, Demonstration, Verbal cues, and Handouts Education comprehension: verbalized understanding and returned demonstration  HOME EXERCISE PROGRAM: Access Code: [email or web address] URL: [email or web address] Date: 09/18/2023 Prepared by: Verta Ellen  Exercises - Long Sitting Ankle Inversion with Resistance  - 1 x daily - 7 x weekly - 2 sets - 10 reps - Long Sitting Ankle Eversion with Resistance  - 1 x daily - 7 x weekly - 2 sets - 10 reps - Long Sitting Ankle Dorsiflexion with Anchored Resistance  - 1 x daily - 7 x weekly - 2 sets - 10 reps - Gastroc Stretch on Wall  - 1 x daily - 7 x weekly - 2 sets - 2 reps - 30 sec hold - Soleus Stretch on Wall  - 1 x daily - 7 x weekly - 2 sets - 2 reps - 30 sec hold - Standing Hip Abduction with Counter Support  - 1 x daily - 7 x weekly - 2 sets - 10 reps - Standing Hip Extension with Counter Support  - 1 x daily - 7 x weekly - 2 sets - 10 reps - Standing Hip Extension with Counter Support  - 1 x daily - 7 x weekly - 2 sets - 10 reps - Standing Heel Raise with Support  - 1 x daily - 7 x weekly - 2 sets - 10 reps   ASSESSMENT:  CLINICAL IMPRESSION: Pt requested to review new exercises added to HEP last visit. She showed a good demonstration with min cues required. She presented with decreased energy, needing frequent rest breaks today d/t recent sickness. Session was ended early d/t this.She notes that she was seen at Cancer center earlier today, they were concerned for R breast infection, she will go see Oncology again next week for further testing. [name] [name] will benefit from skilled PT to address above deficits to improve mobility and activity tolerance with decreased pain interference.  OBJECTIVE IMPAIRMENTS: Abnormal gait, decreased activity tolerance, decreased balance, decreased endurance, decreased mobility, difficulty walking, decreased ROM, decreased  strength, hypomobility, increased fascial restrictions, impaired perceived functional ability, increased muscle spasms, and pain.   ACTIVITY LIMITATIONS: carrying, lifting, bending, sitting, standing, squatting, sleeping, stairs, transfers, bed mobility, and locomotion level  PARTICIPATION LIMITATIONS: meal prep, cleaning, laundry, driving, shopping, community activity, and occupation  PERSONAL FACTORS: Past/current experiences, Time since onset of injury/illness/exacerbation, and 1-2 comorbidities: R Achilles tendinitis 06/2023 progressing to peroneal tenosynovitis, Peripheral neuropathy d/t chemotherapy, CHF, R breast cancer s/p lumpectomy with radioactive seed 03/2015,  lung metastasis   are also affecting patient's functional outcome.   REHAB POTENTIAL: Good  CLINICAL DECISION MAKING: Stable/uncomplicated  EVALUATION COMPLEXITY: Low   GOALS: Goals reviewed with patient? Yes  SHORT TERM GOALS: Target date: 09/16/2023  Patient will be independent with initial HEP. Baseline: given Goal status: MET- 09/18/23   LONG TERM GOALS: Target date: 10/21/2023  Patient will be independent with advanced/ongoing HEP to improve outcomes and carryover.  Baseline:  Goal status: IN PROGRESS  2.  Patient will report at least 75% improvement in Right ankle/foot pain to improve QOL. Baseline: 5-8/10 Goal status: IN PROGRESS- 09/18/23 50%  3.  Patient will demonstrate improved R ankle AROM to [facility] to allow for normal gait and stair mechanics. Baseline: see objective Goal status: IN PROGRESS  4.  Patient will demonstrate improved R ankle strength to 5/5 without pain for improved stability and ease of mobility. Baseline: see objective Goal status: IN PROGRESS- 09/24/23 improved  5.  Patient will be able to ambulate 600' without AD with normal gait pattern without increased foot/ankle pain to access community.  Baseline: antalgic, walking with CAM boot Goal status: IN PROGRESS  6. Patient will be able  to ascend/descend stairs with 1 HR and reciprocal step pattern safely without pain to access home and community.  Baseline: having difficulty on stairs at home Goal status: IN PROGRESS  7.  Patient will report >/= 31/80 on LEFS (MCID = 9 pts) to demonstrate improved functional ability. Baseline: 22/80 Goal status: IN PROGRESS   PLAN:  PT FREQUENCY: 1-2x/week  PT DURATION: 8 weeks  PLANNED INTERVENTIONS: 97110-Therapeutic exercises, 97530- Therapeutic activity, 97112- Neuromuscular re-education, 97535- Self Care, 96295- Manual therapy, [phone]- Gait training, [phone]- Orthotic Fit/training, [phone]- Vasopneumatic device, [id number]- Ultrasound, [id number]- Ionotophoresis 4mg /ml Dexamethasone, Patient/Family education, Balance training, Stair training, Taping, Dry Needling, Joint mobilization, Joint manipulation, Cryotherapy, and Moist heat  PLAN FOR NEXT SESSION: try taping for tendonitis, continue to progress ankle strengthening - Tband exercises, BAPS board to start, manual therapy, modalities PRN - not interested in [employer], PTA,  09/24/2023, 4:10 PM

## 2023-09-27 DIAGNOSIS — N6451 Induration of breast: Secondary | ICD-10-CM | POA: Diagnosis not present

## 2023-09-27 DIAGNOSIS — R92321 Mammographic fibroglandular density, right breast: Secondary | ICD-10-CM | POA: Diagnosis not present

## 2023-09-27 DIAGNOSIS — Z853 Personal history of malignant neoplasm of breast: Secondary | ICD-10-CM | POA: Diagnosis not present

## 2023-09-27 LAB — HM MAMMOGRAPHY

## 2023-09-28 ENCOUNTER — Other Ambulatory Visit: Payer: Self-pay

## 2023-09-29 NOTE — Assessment & Plan Note (Addendum)
T3N1M1, stage IV, ER-/PR-/HER2+, [id number], NED -diagnosed 06/2014 with metastatic disease, s/p neoadjuvant chemo, right lumpectomy, adjuvant radiation and currently on Maintenance Herceptin since 04/12/15. Her restaging scan has showed NED -10/17/2022 - CTA chest - negative for PE or thoracic aortic dissection. No acute findings. There is subpleural scarring of anterior right upper lobe. No new nodules or new disease noted.  -10/17/2022 - CT abdomen and pelvis - colonic diverticulosis. Right renal stone without hydronephrosis.  -new restaging CT CAP due 10/2023.  -She has been on maintenance Herceptin for about 7.5 years.  She is probably cured, although I am not able to approve it. Will continue Herceptin for now, I would consider stopping therapy after 10 years -she continues to tolerate herceptin infusions well.  -03/20/2023 - patient complains of moderate to severe fatigue. She is unsure if this fatigue is similar to fatigue she has previously experienced related to chemotherapy treatments, or if this fatigue is worse. It has not been intrusive yet, but she feels like it will become intrusive soon. Increased concern because her aunt, also a breast cancer survivor, was diagnosed with liver cancer at the same time the patient was diagnosed with breast cancer, in 2015. The patient states that her aunt's only complaint was fatigue.  -reviewed CTA chest and CT abdomen and pelvis with the patient, done 10/2022. There was no evidence of metastatic or new disease noted.  -repeated CT CAP in 03/2023 was negative for recurrence, echo in 04/2023 showed normal EF   -Mammogram done 08/02/2023 with negative -New echocardiogram should be done March 2025 -Repeat CT CAP in June 2025 -Continue maintenance Herceptin every 28 days. -09/24/2023 -scheduled for mammogram and ultrasound of the right breast were ordered.

## 2023-09-29 NOTE — Progress Notes (Unsigned)
Patient Care Team: [name], [name] as PCP - General (Family Medicine) Dorris Singh, DO (Family Medicine) Almond Lint, MD as Consulting Physician (General Surgery) Malachy Mood, MD as Consulting Physician (Hematology) Magda Kiel, MD (Obstetrics and Gynecology) Gala Romney, Bevelyn Buckles, MD as Consulting Physician (Cardiology)  Clinic Day:  09/30/2023  Referring physician: [name], PA  ASSESSMENT & PLAN:   Assessment & Plan: Breast cancer metastasized to lung T3N1M1, stage IV, ER-/PR-/HER2+, [id number], NED -diagnosed 06/2014 with metastatic disease, s/p neoadjuvant chemo, right lumpectomy, adjuvant radiation and currently on Maintenance Herceptin since 04/12/15. Her restaging scan has showed NED -10/17/2022 - CTA chest - negative for PE or thoracic aortic dissection. No acute findings. There is subpleural scarring of anterior right upper lobe. No new nodules or new disease noted.  -10/17/2022 - CT abdomen and pelvis - colonic diverticulosis. Right renal stone without hydronephrosis.  -new restaging CT CAP due 10/2023.  -She has been on maintenance Herceptin for about 7.5 years.  She is probably cured, although I am not able to approve it. Will continue Herceptin for now, I would consider stopping therapy after 10 years -she continues to tolerate herceptin infusions well.  -03/20/2023 - patient complains of moderate to severe fatigue. She is unsure if this fatigue is similar to fatigue she has previously experienced related to chemotherapy treatments, or if this fatigue is worse. It has not been intrusive yet, but she feels like it will become intrusive soon. Increased concern because her aunt, also a breast cancer survivor, was diagnosed with liver cancer at the same time the patient was diagnosed with breast cancer, in 2015. The patient states that her aunt's only complaint was fatigue.  -reviewed CTA chest and CT abdomen and pelvis with the patient, done 10/2022. There was no evidence  of metastatic or new disease noted.  -repeated CT CAP in 03/2023 was negative for recurrence, echo in 04/2023 showed normal EF   -Mammogram done 08/02/2023 with negative -Continue maintenance Herceptin every 28 days. -09/24/2023 -scheduled for mammogram and ultrasound of the right breast were ordered. Tests performed 09/27/2023, confirming diagnosis of cellulitis. Improving on antibiotics.  -09/30/2023 - proceed with Trastuzumab every 28 days.  -New echocardiogram scheduled for 10/14/2023.  -Repeat CT CAP in June 2025    Plan: Labs reviewed from visit 09/24/2023. Appropriate for treatment with trastuzumab today.  Request imaging reports from 09/27/2023 from [facility], confirming diagnosis of cellulitis of right breast.  Continue doxycycline 100 mg Bid for additional 5 day from original prescription.  Encouraged her to take prescribed diflucan if needed for yeast infection.  Roceed with treatment with Trastuzumab today.  Labs/flush, follow up, and treatment as scheduled.  She is scheduled for new echocardiogram 10/14/2023.   The patient understands the plans discussed today and is in agreement with them.  She knows to contact our office if she develops concerns prior to her next appointment.  I provided 25 minutes of face-to-face time during this encounter and > 50% was spent counseling as documented under my assessment and plan.    Carlean Jews, NP  [facility] - A DEPT OF [facility] [address] [place] [address] Dept: [phone] Dept Fax: [phone]   No orders of the defined types were placed in this encounter.     CHIEF COMPLAINT:  CC: Right breast cancer, metastatic to lung.  Current Treatment: Trastuzumab every 28 days  INTERVAL HISTORY:  [name] [name] is here today  for repeat clinical assessment.  She was most recently seen by myself on 09/24/2023.  She was treated with doxycycline twice daily due to  probable cellulitis of the right breast.  Diagnostic mammogram and ultrasound were ordered of the right breast at that visit.  Per patient, results confirmed the diagnosis of cellulitis. Area of tenderness and redness are gradually improving. Though she doesn't note when happening, still having very slight nipple discharge. No nipple inversion. No palpable masses or lumps in either breast. Tolerating doxycycline well. Feels like she may need to take the prescribed diflucan for developing yeast infection. She denies chest pain, chest pressure, or shortness of breath. She denies headaches or visual disturbances. she denies abdominal pain, nausea, vomiting, or changes in bowel or bladder habits.  She denies fevers or chills. She denies pain. Her appetite is good. Her weight has increased 7 pounds over last week .  I have reviewed the past medical history, past surgical history, social history and family history with the patient and they are unchanged from previous note.  ALLERGIES:  is allergic to adhesive [tape], aspirin, and codeine.  MEDICATIONS:  Current Outpatient Medications  Medication Sig Dispense Refill   acetaminophen (TYLENOL) 500 MG tablet Take 500 mg by mouth every 6 (six) hours as needed.     Ascorbic Acid (VITAMIN C) 500 MG CHEW      benzonatate (TESSALON) 200 MG capsule Take 1 capsule (200 mg total) by mouth 3 (three) times daily as needed for cough. 20 capsule 0   Calcium-Phosphorus-Vitamin D (CALCIUM GUMMIES PO) Take by mouth daily.     Camphor-Eucalyptus-Menthol (VICKS VAPORUB EX) Apply 1 application. topically at bedtime. Applies under nose, throat and on chest as needed     celecoxib (CELEBREX) 100 MG capsule Take 1 capsule (100 mg total) by mouth 2 (two) times daily. 60 capsule 0   cetirizine (ZYRTEC) 10 MG tablet Take 10 mg by mouth daily as needed for allergies (allergies).      doxycycline (VIBRAMYCIN) 100 MG capsule Take 1 capsule (100 mg total) by mouth 2 (two) times daily. 10  capsule 0   fluconazole (DIFLUCAN) 150 MG tablet Take 1 tablet po once for yeast infection. May repeat dose in 3 days for persistent symptoms 3 tablet 1   fluticasone (FLONASE) 50 MCG/ACT nasal spray Place into both nostrils daily.     gabapentin (NEURONTIN) 100 MG capsule TAKE 2 CAPSULES(200 MG) BY MOUTH AT BEDTIME 60 capsule 1   hydrocortisone 2.5 % cream Apply 1 application topically 2 (two) times daily as needed.     ibuprofen (ADVIL,MOTRIN) 800 MG tablet Take 1 tablet (800 mg total) by mouth every 8 (eight) hours as needed. 60 tablet 1   lidocaine-prilocaine (EMLA) cream APPLY TO THE AFFECTED AREA PRIOR TO CHEMOTHERAPY AS DIRECTED 30 g 2   losartan (COZAAR) 25 MG tablet Take 1 tablet (25 mg total) by mouth daily. 90 tablet 1   meloxicam (MOBIC) 7.5 MG tablet Take 7.5 mg by mouth daily.     Multiple Vitamins-Minerals (MULTIVITAMIN GUMMIES ADULT PO) Take 1 each by mouth daily. Women's Vitafusion gummie     spironolactone (ALDACTONE) 25 MG tablet TAKE 1 TABLET(25 MG) BY MOUTH DAILY 30 tablet 0   No current facility-administered medications for this visit.   Facility-Administered Medications Ordered in Other Visits  Medication Dose Route Frequency Provider Last Rate Last Admin   heparin lock flush 100 unit/mL  500 Units Intracatheter Once PRN Malachy Mood, MD  sodium chloride 0.9 % injection 10 mL  10 mL Intravenous PRN Malachy Mood, MD       sodium chloride flush (NS) 0.9 % injection 10 mL  10 mL Intravenous PRN Malachy Mood, MD       sodium chloride flush (NS) 0.9 % injection 10 mL  10 mL Intracatheter PRN Malachy Mood, MD       trastuzumab-anns Ernestine Mcmurray) 600 mg in sodium chloride 0.9 % 250 mL chemo infusion  6 mg/kg (Treatment Plan Recorded) Intravenous Once Malachy Mood, MD        HISTORY OF PRESENT ILLNESS:   Oncology History Overview Note  Breast cancer metastasized to lung   Staging form: Breast, AJCC 7th Edition     Clinical stage from 07/22/2014: Stage IV (T3, N1, M1) - Unsigned      Breast cancer metastasized to lung (HCC)  07/02/2014 Mammogram   Mammogram showed a 2cm right beast mass and a 1.8cm right axillary node. MRI breast on 07/16/2014 showed 7cm R breast lesion and 4.4cm r axillary node    07/02/2014 Imaging   CT CAP: a 4.7cm mass in LUL lung and a 2.1cm mas in RML, and a small nodule in RUL, suspecious for metastasis     07/09/2014 Initial Diagnosis   right IDA with b/l lung lesions, ER-/PR-/HER2+   07/09/2014 Initial Biopsy   US guided right breast mass and axillary node biopsy showed IDA, and DCIS, ER-/PR-/HER2+   07/26/2014 Pathologic Stage   Left lung mass by IR, path revealed high grade carcinoma, morphology similar to breast tumor biopsy, TTF(-), NapsinA(-), ER(-)   08/04/2014 - 03/22/2015 Chemotherapy   weekly Paclitaxel 80mg /m2, trastuzumab and pertuzumab every 3 weeks   08/30/2014 Genetic Testing   BreastNext panel was negative. 17 genes including BRCA1, BRCA2, were negative for mutations.    10/04/2014 Imaging   Interval decrease in the right axillary lymphadenopathy. Bilateral pulmonary lesions with left hilar lymphadenopathy also markedly decreased in the interval. The left hilar lymphadenopathy has resolved.   12/20/2014 Imaging   restaging CT showed stable disease, no new lesions    03/29/2015 Pathology Results    right breast lumpectomy showed  chemotherapy treatment effect,  a 1 mm residual tumor,   margins were widely negative, 5 sentinel lymph nodes and 2 axillary lymph nodes were negative.    03/29/2015 Surgery    right breast lumpectomy and sentinel lymph node biopsy  by Dr. Donell Beers   04/12/2015 -  Chemotherapy   Herceptin maintenance therapy , 6 mg/kg, every 3 weeks since 04/12/15. Switched to Herceptin Hylecta injection q3weeks after 06/10/19.   04/12/2015 - 10/04/2022 Chemotherapy   Patient is on Treatment Plan : BREAST Trastuzumab q21d     05/03/2015 - 06/14/2015 Radiation Therapy   right breast adjuvant irradiation by Dr. Dayton Scrape     08/28/2016 Imaging   CT CAP w Contrast  IMPRESSION: 1. No acute process or evidence of metastatic disease within the chest, abdomen, or pelvis. 2. Similar to less well-defined left upper lobe density, likely scarring. No evidence of new or progressive pulmonary metastasis. 3. Right nephrolithiasis.   01/22/2017 Imaging   CT Chest W Contrast 01/22/17 IMPRESSION: 1. Stable exam. No new or progressive findings. No evidence for metastatic disease. 2. Left upper lobe architectural distortion/scarring is stable. 3. Nonobstructing right renal stone.   07/31/2017 Imaging   Bone Scan Whole Body 07/31/17  IMPRESSION: 1. No scintigraphic evidence of osseous metastatic disease. 2. Thoracolumbar scoliosis.    07/31/2017 Imaging   CT  CAP W Contrast 07/31/17 IMPRESSION: 1. No findings of active or recurrent malignancy. 2. Other imaging findings of potential clinical significance: Aortic Atherosclerosis (ICD10-I70.0). Mild cardiomegaly. Postoperative and radiation therapy findings in the right chest. Nonobstructive right nephrolithiasis. Thoracolumbar scoliosis.     01/20/2018 Echocardiogram   ECHO 01/20/2018 Study Conclusions   - Left ventricle: The cavity size was normal. Wall thickness was   normal. Systolic function was normal. The estimated ejection   fraction was in the range of 55% to 60%. Wall motion was normal;   there were no regional wall motion abnormalities. Doppler   parameters are consistent with abnormal left ventricular   relaxation (grade 1 diastolic dysfunction). - Impressions: GLS -20.7% LS&' 10.4 cm.    02/17/2018 Imaging   02/17/2018 CT CAP IMPRESSION: 1. No evidence for residual or recurrent tumor or metastatic disease. 2.  Aortic Atherosclerosis (ICD10-I70.0). 3. Right renal calculi. 4. Scoliosis.   08/11/2018 Imaging   CT CAP W Contrast 08/11/18  IMPRESSION: No evidence for localized recurrence or metastatic disease in the chest, abdomen or pelvis.    02/23/2019 Imaging   CT CAP W Contrast  IMPRESSION: 1. No findings of active malignancy in the chest, abdomen, or pelvis. 2. 3 mm right kidney lower pole nonobstructive renal calculus. 3. Thoracolumbar scoliosis.   12/08/2019 Imaging   CT CAP W contrast  IMPRESSION: 1. Unchanged post treatment appearance of the lungs. No evidence of recurrent or new metastatic disease in the chest, abdomen, or pelvis.   2. Postoperative findings of right lumpectomy and axillary lymph node dissection.   3.  Nonobstructive right nephrolithiasis.   11/17/2020 Imaging   CT CAP IMPRESSION: 1. No evidence of recurrence or new metastatic disease within the chest, abdomen, or pelvis. 2. Similar post treatment changes in the lungs and postoperative findings of right lumpectomy and axillary lymph node dissection. 3. Colonic diverticulosis without findings of acute diverticulitis.   08/10/2021 Genetic Testing   Negative genetic testing on the CancerNext-Expanded+RNAinsight panel.  The report date is August 10, 2021.  The CancerNext-Expanded gene panel offered by [facility] and includes sequencing and rearrangement analysis for the following 77 genes: AIP, ALK, APC*, ATM*, AXIN2, BAP1, BARD1, BLM, BMPR1A, BRCA1*, BRCA2*, BRIP1*, CDC73, CDH1*, CDK4, CDKN1B, CDKN2A, CHEK2*, CTNNA1, DICER1, FANCC, FH, FLCN, GALNT12, KIF1B, LZTR1, MAX, MEN1, MET, MLH1*, MSH2*, MSH3, MSH6*, MUTYH*, NBN, NF1*, NF2, NTHL1, PALB2*, PHOX2B, PMS2*, POT1, PRKAR1A, PTCH1, PTEN*, RAD51C*, RAD51D*, RB1, RECQL, RET, SDHA, SDHAF2, SDHB, SDHC, SDHD, SMAD4, SMARCA4, SMARCB1, SMARCE1, STK11, SUFU, TMEM127, TP53*, TSC1, TSC2, VHL and XRCC2 (sequencing and deletion/duplication); EGFR, EGLN1, HOXB13, KIT, MITF, PDGFRA, POLD1, and POLE (sequencing only); EPCAM and GREM1 (deletion/duplication only). DNA and RNA analyses performed for * genes.    10/17/2022 Imaging   CTA Chest and CT abdomen and pelvis with contrast  IMPRESSION: 1. Negative for  acute PE or thoracic aortic dissection. 2. No acute findings. 3. Colonic diverticulosis. 4. Right nephrolithiasis without hydronephrosis.   10/25/2022 - 10/25/2022 Chemotherapy   Patient is on Treatment Plan : BREAST MAINTENANCE Trastuzumab IV (6) or SQ (600) D1 q21d X 11 Cycles     10/25/2022 -  Chemotherapy   Patient is on Treatment Plan : BREAST MAINTENANCE Trastuzumab IV (6) or SQ (600) D1 q21d x 13 cycles         REVIEW OF SYSTEMS:   Constitutional: Denies fevers, chills or abnormal weight loss. Fatigue.  Eyes: Denies blurriness of vision Ears, nose, mouth, throat, and face: Denies mucositis or sore throat Respiratory: Denies  cough, dyspnea or wheezes Cardiovascular: Denies palpitation, chest discomfort or lower extremity swelling Gastrointestinal:  Denies nausea, heartburn or change in bowel habits Skin: Denies abnormal skin rashes Lymphatics: Denies new lymphadenopathy or easy bruising Neurological:Denies numbness, tingling or new weaknesses Behavioral/Psych: Mood is stable, no new changes  Breast: area of redness and thickening is improving in the right breast. Some tenderness still present. Still noticing evidence of nipple discharge.  All other systems were reviewed with the patient and are negative.   VITALS:   Today's Vitals   09/30/23 0941  BP: (!) 143/72  Pulse: 79  Resp: 18  Temp: 98.2 F (36.8 C)  TempSrc: Temporal  SpO2: 100%  Weight: 238 lb 14.4 oz (108.4 kg)   Body mass index is 34.28 kg/m.   Wt Readings from Last 3 Encounters:  09/30/23 238 lb 14.4 oz (108.4 kg)  09/24/23 231 lb 9.6 oz (105.1 kg)  09/02/23 235 lb 1.9 oz (106.6 kg)    Body mass index is 34.28 kg/m.  Performance status (ECOG): 1 - Symptomatic but completely ambulatory  PHYSICAL EXAM:   GENERAL:alert, no distress and comfortable SKIN: skin color, texture, turgor are normal, no rashes or significant lesions EYES: normal, Conjunctiva are pink and non-injected, sclera  clear OROPHARYNX:no exudate, no erythema and lips, buccal mucosa, and tongue normal  NECK: supple, thyroid normal size, non-tender, without nodularity LYMPH:  no palpable lymphadenopathy in the cervical, axillary or inguinal LUNGS: clear to auscultation and percussion with normal breathing effort HEART: regular rate & rhythm and no murmurs and no lower extremity edema ABDOMEN:abdomen soft, non-tender and normal bowel sounds Musculoskeletal:no cyanosis of digits and no clubbing  NEURO: alert & oriented x 3 with fluent speech, no focal motor/sensory deficits BREAST: redness and thickness of skin in lower inner quadrant of the right breast, adjacent to the areola. Scant dried, white drainage noted around nipple. Mild tenderness with palpation of the right breast. No palpable masses or lumps noted. Left breast is without palpable lumps or masse today. There is no nipple inversion or discharge noted. No axillary lymphadenopathy on the left side.   LABORATORY DATA:  I have reviewed the data as listed    Component Value Date/Time   NA 136 09/24/2023 0850   NA 141 08/01/2017 1305   K 3.6 09/24/2023 0850   K 3.6 08/01/2017 1305   CL 101 09/24/2023 0850   CO2 28 09/24/2023 0850   CO2 27 08/01/2017 1305   GLUCOSE 122 (H) 09/24/2023 0850   GLUCOSE 108 08/01/2017 1305   BUN 21 (H) 09/24/2023 0850   BUN 10.5 08/01/2017 1305   CREATININE 1.00 09/24/2023 0850   CREATININE 0.9 08/01/2017 1305   CALCIUM 9.5 09/24/2023 0850   CALCIUM 9.4 08/01/2017 1305   PROT 8.5 (H) 09/24/2023 0850   PROT 7.6 08/01/2017 1305   ALBUMIN 4.2 09/24/2023 0850   ALBUMIN 3.6 08/01/2017 1305   AST 34 09/24/2023 0850   AST 21 08/01/2017 1305   ALT 37 09/24/2023 0850   ALT 26 08/01/2017 1305   ALKPHOS 78 09/24/2023 0850   ALKPHOS 73 08/01/2017 1305   BILITOT 0.4 09/24/2023 0850   BILITOT 0.25 08/01/2017 1305   GFRNONAA >60 09/24/2023 0850   GFRAA >60 03/30/2020 1343    Lab Results  Component Value Date   WBC 7.2  09/24/2023   NEUTROABS 4.4 09/24/2023   HGB 13.2 09/24/2023   HCT 39.1 09/24/2023   MCV 88.9 09/24/2023   PLT 241 09/24/2023

## 2023-09-30 ENCOUNTER — Ambulatory Visit: Payer: BC Managed Care – PPO | Admitting: Nurse Practitioner

## 2023-09-30 ENCOUNTER — Ambulatory Visit: Payer: BC Managed Care – PPO

## 2023-09-30 ENCOUNTER — Other Ambulatory Visit: Payer: Self-pay | Admitting: Nurse Practitioner

## 2023-09-30 ENCOUNTER — Other Ambulatory Visit: Payer: BC Managed Care – PPO

## 2023-09-30 ENCOUNTER — Inpatient Hospital Stay: Payer: BC Managed Care – PPO

## 2023-09-30 ENCOUNTER — Encounter: Payer: Self-pay | Admitting: Nurse Practitioner

## 2023-09-30 ENCOUNTER — Inpatient Hospital Stay (HOSPITAL_BASED_OUTPATIENT_CLINIC_OR_DEPARTMENT_OTHER): Payer: BC Managed Care – PPO | Admitting: Nurse Practitioner

## 2023-09-30 VITALS — BP 143/72 | HR 79 | Temp 98.2°F | Resp 18 | Wt 238.9 lb

## 2023-09-30 DIAGNOSIS — C50911 Malignant neoplasm of unspecified site of right female breast: Secondary | ICD-10-CM | POA: Diagnosis not present

## 2023-09-30 DIAGNOSIS — Z5112 Encounter for antineoplastic immunotherapy: Secondary | ICD-10-CM | POA: Diagnosis not present

## 2023-09-30 DIAGNOSIS — C7800 Secondary malignant neoplasm of unspecified lung: Secondary | ICD-10-CM

## 2023-09-30 DIAGNOSIS — Z171 Estrogen receptor negative status [ER-]: Secondary | ICD-10-CM | POA: Diagnosis not present

## 2023-09-30 MED ORDER — DOXYCYCLINE HYCLATE 100 MG PO CAPS: 100.0000 mg | ORAL_CAPSULE | Freq: Two times a day (BID) | ORAL | 0 refills | Status: DC

## 2023-09-30 MED ORDER — SODIUM CHLORIDE 0.9 % IV SOLN: Freq: Once | INTRAVENOUS | Status: AC

## 2023-09-30 MED ORDER — TRASTUZUMAB-ANNS CHEMO 150 MG IV SOLR
6.0000 mg/kg | Freq: Once | INTRAVENOUS | Status: AC
  Administered 2023-09-30: 600 mg via INTRAVENOUS
  Filled 2023-09-30: qty 28.57

## 2023-09-30 MED ORDER — HEPARIN SOD (PORK) LOCK FLUSH 100 UNIT/ML IV SOLN
500.0000 [IU] | Freq: Once | INTRAVENOUS | Status: AC | PRN
  Administered 2023-09-30: 500 [IU]

## 2023-09-30 MED ORDER — SODIUM CHLORIDE 0.9% FLUSH
10.0000 mL | INTRAVENOUS | Status: DC | PRN
  Administered 2023-09-30: 10 mL

## 2023-09-30 NOTE — Patient Instructions (Signed)
 [facility] - A DEPT OF [facility]  Discharge Instructions: Thank you for choosing [facility] to provide your oncology and hematology care.   If you have a lab appointment with the Cancer Center, please go directly to the Cancer Center and check in at the registration area.   Wear comfortable clothing and clothing appropriate for easy access to any Portacath or PICC line.   We strive to give you quality time with your provider. You may need to reschedule your appointment if you arrive late (15 or more minutes).  Arriving late affects you and other patients whose appointments are after yours.  Also, if you miss three or more appointments without notifying the office, you may be dismissed from the clinic at the provider's discretion.      For prescription refill requests, have your pharmacy contact our office and allow 72 hours for refills to be completed.    Today you received the following chemotherapy and/or immunotherapy agents: Kanjinti      To help prevent nausea and vomiting after your treatment, we encourage you to take your nausea medication as directed.  BELOW ARE SYMPTOMS THAT SHOULD BE REPORTED IMMEDIATELY: *FEVER GREATER THAN 100.4 F (38 C) OR HIGHER *CHILLS OR SWEATING *NAUSEA AND VOMITING THAT IS NOT CONTROLLED WITH YOUR NAUSEA MEDICATION *UNUSUAL SHORTNESS OF BREATH *UNUSUAL BRUISING OR BLEEDING *URINARY PROBLEMS (pain or burning when urinating, or frequent urination) *BOWEL PROBLEMS (unusual diarrhea, constipation, pain near the anus) TENDERNESS IN MOUTH AND THROAT WITH OR WITHOUT PRESENCE OF ULCERS (sore throat, sores in mouth, or a toothache) UNUSUAL RASH, SWELLING OR PAIN  UNUSUAL VAGINAL DISCHARGE OR ITCHING   Items with * indicate a potential emergency and should be followed up as soon as possible or go to the Emergency Department if any problems should occur.  Please show the CHEMOTHERAPY ALERT CARD or IMMUNOTHERAPY  ALERT CARD at check-in to the Emergency Department and triage nurse.  Should you have questions after your visit or need to cancel or reschedule your appointment, please contact [facility] - A DEPT OF [facility]  Dept: [phone]  and follow the prompts.  Office hours are 8:00 a.m. to 4:30 p.m. Monday - Friday. Please note that voicemails left after 4:00 p.m. may not be returned until the following business day.  We are closed weekends and major holidays. You have access to a nurse at all times for urgent questions. Please call the main number to the clinic Dept: [phone] and follow the prompts.   For any non-urgent questions, you may also contact your provider using MyChart. We now offer e-Visits for anyone 60 and older to request care online for non-urgent symptoms. For details visit [email or web address].   Also download the MyChart app! Go to the app store, search "MyChart", open the app, select [facility], and log in with your MyChart username and password.

## 2023-10-01 ENCOUNTER — Other Ambulatory Visit: Payer: Self-pay

## 2023-10-02 ENCOUNTER — Ambulatory Visit: Payer: BC Managed Care – PPO

## 2023-10-09 ENCOUNTER — Ambulatory Visit: Payer: BC Managed Care – PPO | Admitting: Physical Therapy

## 2023-10-09 ENCOUNTER — Encounter: Payer: Self-pay | Admitting: Physical Therapy

## 2023-10-09 DIAGNOSIS — M6281 Muscle weakness (generalized): Secondary | ICD-10-CM

## 2023-10-09 DIAGNOSIS — R262 Difficulty in walking, not elsewhere classified: Secondary | ICD-10-CM | POA: Diagnosis not present

## 2023-10-09 DIAGNOSIS — M25671 Stiffness of right ankle, not elsewhere classified: Secondary | ICD-10-CM | POA: Diagnosis not present

## 2023-10-09 DIAGNOSIS — M25571 Pain in right ankle and joints of right foot: Secondary | ICD-10-CM | POA: Diagnosis not present

## 2023-10-09 NOTE — Therapy (Signed)
 OUTPATIENT PHYSICAL THERAPY TREATMENT/Discharge Summary  PHYSICAL THERAPY DISCHARGE SUMMARY  Visits from Start of Care: 5  Current functional level related to goals / functional outcomes: All goals met.  R ankle pain has resolved, improved strength and ROM.  LEFS improved from 22/80 to 63/80   Remaining deficits: Needs to stretch daily, difficulty with prolonged standing unless stretches prior.    Education / Equipment: HEP  Plan: Patient agrees to discharge.  Patient is being discharged due to meeting the stated rehab goals.       Patient Name: [name] [name] MRN: [id number] DOB:1970-10-23, 53 y.o., female Today's Date: 10/09/2023   END OF SESSION:  PT End of Session - 10/09/23 1533     Visit Number 5    Date for PT Re-Evaluation 10/21/23    Authorization Type BCBS    Progress Note Due on Visit 10    PT Start Time 1533    PT Stop Time 1553    PT Time Calculation (min) 20 min    Activity Tolerance Patient tolerated treatment well    Behavior During Therapy [facility] for tasks assessed/performed                Past Medical History:  Diagnosis Date   Breast cancer (HCC)    Breast cancer (HCC)    CHF (congestive heart failure) (HCC)    Family history of breast cancer    Family history of prostate cancer    GERD (gastroesophageal reflux disease)    during pregnancy    Pneumonia    hx of pneumonia 08/2013    S/P radiation therapy 05/03/2015 through 06/14/2015                                                      Right breast 4680 cGy in 26 sessions, right breast boost 1000 cGy in 5 sessions.  Right supraclavicular/axillary region 4680 cGy with a supplemental PA field to bring the axillary dose up to 4500 cGy in 26 sessions   Past Surgical History:  Procedure Laterality Date   BREAST LUMPECTOMY WITH RADIOACTIVE SEED AND SENTINEL LYMPH NODE BIOPSY Right 03/29/2015   Procedure: BREAST LUMPECTOMY WITH RADIOACTIVE SEED AND SENTINEL LYMPH NODE BIOPSY;  Surgeon: Almond Lint, MD;  Location: [facility];  Service: General;  Laterality: Right;   CESAREAN SECTION     CHOLECYSTECTOMY     ESSURE TUBAL LIGATION     PORTACATH PLACEMENT Left 07/21/2014   Procedure: INSERTION PORT-A-CATH;  Surgeon: Almond Lint, MD;  Location: [facility];  Service: General;  Laterality: Left;   WISDOM TOOTH EXTRACTION     Patient Active Problem List   Diagnosis Date Noted   Other fatigue 03/20/2023   Hyperglycemia 03/20/2023   Family history of liver cancer 03/20/2023   Genetic testing 08/10/2021   Family history of prostate cancer 07/27/2021   Port-A-Cath in place 08/21/2018   Cellulitis of right breast 07/04/2018   Right arm pain 04/09/2017   Sinusitis 07/03/2016   Candidiasis of breast 04/05/2016   Eczema 04/05/2016   Port-A-Cath in place 12/06/2015   Seasonal allergies 10/04/2015   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   Peripheral neuropathy due to chemotherapy (HCC) 03/15/2015   Breast cancer metastasized to lung (HCC) 07/22/2014   Family history of breast cancer 07/22/2014   Lung mas bilaterial 07/22/2014  PCP: [name], PA   REFERRING PROVIDER: Burna Forts, MD   REFERRING DIAG: [phone] (ICD-10-CM) - Peroneal tenosynovitis   THERAPY DIAG:  Pain in right ankle and joints of right foot  Stiffness of right ankle, not elsewhere classified  Difficulty in walking, not elsewhere classified  Muscle weakness (generalized)  RATIONALE FOR EVALUATION AND TREATMENT: Rehabilitation  ONSET DATE: July 12, 2023  NEXT MD VISIT: 09/02/23   SUBJECTIVE:                                                                                                                                                                                                         SUBJECTIVE STATEMENT: Sick again.  But the ankle is doing well, leaves her alone as long as she does her stretches, if she skips a day it reminds her.  Mind is ready to start using treadmill at  home, but hasn't due to illness.   PAIN: Are you having pain? Yes: NPRS scale: 0/10 Pain location: R ankle (moves around sometimes inside, sometimes outside, occasionally in the heel) Pain description: throbbing, pulling, ankle bone hurts (new) Aggravating factors: quick movements especially driving, transitional movements when can't wear boot Relieving factors: CAM boot, elevation, icing   PERTINENT HISTORY:  R Achilles tendinitis 06/2023 progressing to peroneal tenosynovitis, Peripheral neuropathy d/t chemotherapy, CHF, R breast cancer s/p lumpectomy with radioactive seed 03/2015, lung metastasis   PRECAUTIONS: Other: history of Breast CA with neuropathy  RED FLAGS: None  WEIGHT BEARING RESTRICTIONS: No  FALLS:  Has patient fallen in last 6 months? No  LIVING ENVIRONMENT: Lives with: lives with their family Lives in: House/apartment Stairs: Yes: Internal: 16 steps; on left going up Has following equipment at home:  CAM boot  OCCUPATION: Call center  PLOF: Independent and Leisure: treadmill walking - just bought prior to injury  PATIENT GOALS: use treadmill, decrease pain, wear regular shoes  OBJECTIVE: (objective measures completed at initial evaluation unless otherwise dated)  DIAGNOSTIC FINDINGS:  07/22/23 - Limited POCUS posterior ankle (right): Hypoechoic stranding w/o neovascular findings within kager's fat pad. Mild thickening of distal achilles tendon without appreciable fibrillar pattern disruption of tendon. Distal peroneal brevis and longus tenosynovitis spanning landmarks between posterior lateral malleolus to base of 5th MT without signs of fibrillar pattern disruption  PATIENT SURVEYS:  LEFS 22/80  COGNITION: Overall cognitive status: Within functional limits for tasks assessed    SENSATION: Neuropathy from chemo in toes and fingers  EDEMA:  Circumferential: R 57.5 cm, L 57.5 cm    POSTURE:  No Significant postural  limitations  PALPATION: Tenderness along R peroneal tendons, achilles insertion R calf.  Crepitus with rear foot joint mobs.   MUSCLE LENGTH: Heelcord: Tightness R   LOWER EXTREMITY ROM:  Passive ROM Right eval Left eval   Knee extension ~ 10 deg hyperextension ~ 10 deg hyperextension   Ankle dorsiflexion 0 10   Ankle plantarflexion     Ankle inversion 30p! 40 38  Ankle eversion 40p! 40 40   LOWER EXTREMITY MMT:  MMT Right eval* Left eval* R 09/24/23 Right  10/09/23  Hip flexion 5 5    Hip extension      Hip abduction 5 5    Hip adduction 4+ 4+    Knee flexion 5 5    Knee extension 5 5    Ankle dorsiflexion 4+ 5 5 5   Ankle plantarflexion 4+ 5  5  Ankle inversion 4p! 5 4+ 5  Ankle eversion 4p! 5 4+ 5   (Blank rows = not tested) * tested in seated position  LOWER EXTREMITY SPECIAL TESTS:  Ankle special tests: Anterior drawer test: negative, Talar tilt test: negative, and Great toe extension test: negative  FUNCTIONAL TESTS:  Gait speed 1 m/s  GAIT: Distance walked: 53' Assistive device utilized:  CAM boot on R and None Level of assistance: Complete Independence Gait pattern: antalgic Comments: decreased gait speed, antalgic, wearing CAM boot so increased steppage on R   TODAY'S TREATMENT:   10/09/2023 Therapeutic Activity:  to assess progress towards goals Treadmill Incline 1, 1.4 mph x 6 min  Stairs - ascending/descending 14 steps reciprocal no HR MMT and ROM R ankle LEFS   09/24/2023 Therapeutic Exercise: to improve strength and mobility.  Demo, verbal and tactile cues throughout for technique. Standing hip abduction 2x10 bil Standing hip extension 2x10 bil Standing gastroc stretch 2x30" Standing soleus stretch x 30" LE strength test  09/18/2023 Therapeutic Exercise: to improve strength and mobility.  Demo, verbal and tactile cues throughout for technique. Treadmill x 6 min 2.0 incline 4 way ankle RTB x 20  R runner stretch 2x30" Soleus stretch  standing 2x30" Standing hip abduction x 10 bil Standing hip extension x 10 bil 4 way weight shifting on airex  Heel raises standing x 10   1/128/2025 EVAL Therapeutic Exercise: to improve strength and mobility.  Demo, verbal and tactile cues throughout for technique. Treadmill x 6 min 2.0 incline Gastroc stretch 2x30" with strap 4 way ankle YTB x 10  BAPS board: PF/DF/EV/IV circles both ways x 10 each Gait Training review- decreased step length B  08/26/2023 EVAL Therapeutic Exercise: to improve strength and mobility.  Demo, verbal and tactile cues throughout for technique. - Seated Ankle Alphabet  DEMO - Seated Calf Towel Stretch  - 3 reps - 30 sec hold - Seated Ankle Pumps  DEMO Self Care: Findings, POC, recommendations for how to wean out of CAM boot - can remove when sitting, try ankle brace for walking for gentle compression and for driving, if ankle hurts with walking continue to use CAM boot.   PEACE v. RICE    PATIENT EDUCATION:  Education details: HEP Person educated: Patient Education method: Programmer, multimedia, Demonstration, Verbal cues, and Handouts Education comprehension: verbalized understanding and returned demonstration  HOME EXERCISE PROGRAM: Access Code: [email or web address] URL: [email or web address] Date: 09/18/2023 Prepared by: Verta Ellen  Exercises - Long Sitting Ankle Inversion with Resistance  - 1 x daily - 7 x weekly - 2 sets - 10 reps - Long Sitting Ankle Eversion with Resistance  -  1 x daily - 7 x weekly - 2 sets - 10 reps - Long Sitting Ankle Dorsiflexion with Anchored Resistance  - 1 x daily - 7 x weekly - 2 sets - 10 reps - Gastroc Stretch on Wall  - 1 x daily - 7 x weekly - 2 sets - 2 reps - 30 sec hold - Soleus Stretch on Wall  - 1 x daily - 7 x weekly - 2 sets - 2 reps - 30 sec hold - Standing Hip Abduction with Counter Support  - 1 x daily - 7 x weekly - 2 sets - 10 reps - Standing Hip Extension with Counter Support  - 1 x daily - 7 x weekly  - 2 sets - 10 reps - Standing Hip Extension with Counter Support  - 1 x daily - 7 x weekly - 2 sets - 10 reps - Standing Heel Raise with Support  - 1 x daily - 7 x weekly - 2 sets - 10 reps   ASSESSMENT:  CLINICAL IMPRESSION: HARLEM THRESHER reports that her R ankle pain has almost completely resolved.  She is compliant with her exercises, and with daily stretches she has no pain.  She no longer wears the CAM boot, pain actually started improved as soon as she started to wean out of boot per PT recommendation.  Since she did not feel well today, reviewed her goals, since she had met all goals and feels comfortable with current exercises, discharged to HEP.   OBJECTIVE IMPAIRMENTS: Abnormal gait, decreased activity tolerance, decreased balance, decreased endurance, decreased mobility, difficulty walking, decreased ROM, decreased strength, hypomobility, increased fascial restrictions, impaired perceived functional ability, increased muscle spasms, and pain.   ACTIVITY LIMITATIONS: carrying, lifting, bending, sitting, standing, squatting, sleeping, stairs, transfers, bed mobility, and locomotion level  PARTICIPATION LIMITATIONS: meal prep, cleaning, laundry, driving, shopping, community activity, and occupation  PERSONAL FACTORS: Past/current experiences, Time since onset of injury/illness/exacerbation, and 1-2 comorbidities: R Achilles tendinitis 06/2023 progressing to peroneal tenosynovitis, Peripheral neuropathy d/t chemotherapy, CHF, R breast cancer s/p lumpectomy with radioactive seed 03/2015, lung metastasis   are also affecting patient's functional outcome.   REHAB POTENTIAL: Good  CLINICAL DECISION MAKING: Stable/uncomplicated  EVALUATION COMPLEXITY: Low   GOALS: Goals reviewed with patient? Yes  SHORT TERM GOALS: Target date: 09/16/2023  Patient will be independent with initial HEP. Baseline: given Goal status: MET- 09/18/23   LONG TERM GOALS: Target date: 10/21/2023  Patient  will be independent with advanced/ongoing HEP to improve outcomes and carryover.  Baseline:  Goal status: MET 10/09/23  2.  Patient will report at least 75% improvement in Right ankle/foot pain to improve QOL. Baseline: 5-8/10 Goal status: MET-10/09/23   3.  Patient will demonstrate improved R ankle AROM to [facility] to allow for normal gait and stair mechanics. Baseline: see objective Goal status: MET 10/09/23  4.  Patient will demonstrate improved R ankle strength to 5/5 without pain for improved stability and ease of mobility. Baseline: see objective Goal status: MET- 10/09/23  5.  Patient will be able to ambulate 600' without AD with normal gait pattern without increased foot/ankle pain to access community.  Baseline: antalgic, walking with CAM boot Goal status: MET  10/09/23   6. Patient will be able to ascend/descend stairs with 1 HR and reciprocal step pattern safely without pain to access home and community.  Baseline: having difficulty on stairs at home Goal status: MET 10/09/23  7.  Patient will report >/= 31/80 on LEFS (MCID =  9 pts) to demonstrate improved functional ability. Baseline: 22/80 Goal status: MET 63/80   PLAN:  PT FREQUENCY: 1-2x/week  PT DURATION: 8 weeks  PLANNED INTERVENTIONS: 97110-Therapeutic exercises, 97530- Therapeutic activity, 97112- Neuromuscular re-education, 97535- Self Care, 16109- Manual therapy, [phone]- Gait training, [phone]- Orthotic Fit/training, [phone]- Vasopneumatic device, [id number]- Ultrasound, 29562- Ionotophoresis 4mg /ml Dexamethasone, Patient/Family education, Balance training, Stair training, Taping, Dry Needling, Joint mobilization, Joint manipulation, Cryotherapy, and Moist heat  PLAN FOR NEXT SESSION: discharged to HEP   Jena Gauss, PT, DPT 10/09/2023, 4:01 PM

## 2023-10-10 DIAGNOSIS — R070 Pain in throat: Secondary | ICD-10-CM | POA: Diagnosis not present

## 2023-10-10 DIAGNOSIS — R519 Headache, unspecified: Secondary | ICD-10-CM | POA: Diagnosis not present

## 2023-10-10 DIAGNOSIS — J101 Influenza due to other identified influenza virus with other respiratory manifestations: Secondary | ICD-10-CM | POA: Diagnosis not present

## 2023-10-14 ENCOUNTER — Encounter (HOSPITAL_COMMUNITY): Payer: BC Managed Care – PPO | Admitting: Internal Medicine

## 2023-10-14 ENCOUNTER — Ambulatory Visit (HOSPITAL_COMMUNITY): Payer: BC Managed Care – PPO

## 2023-10-15 ENCOUNTER — Encounter (HOSPITAL_COMMUNITY): Payer: BC Managed Care – PPO | Admitting: Internal Medicine

## 2023-10-15 ENCOUNTER — Other Ambulatory Visit (HOSPITAL_COMMUNITY): Payer: BC Managed Care – PPO

## 2023-10-16 ENCOUNTER — Emergency Department (HOSPITAL_BASED_OUTPATIENT_CLINIC_OR_DEPARTMENT_OTHER)

## 2023-10-16 ENCOUNTER — Other Ambulatory Visit: Payer: Self-pay

## 2023-10-16 ENCOUNTER — Encounter (HOSPITAL_BASED_OUTPATIENT_CLINIC_OR_DEPARTMENT_OTHER): Payer: Self-pay | Admitting: Emergency Medicine

## 2023-10-16 ENCOUNTER — Emergency Department (HOSPITAL_BASED_OUTPATIENT_CLINIC_OR_DEPARTMENT_OTHER)
Admission: EM | Admit: 2023-10-16 | Discharge: 2023-10-16 | Disposition: A | Discharge status: Home / Self Care | Attending: Emergency Medicine | Admitting: Emergency Medicine

## 2023-10-16 DIAGNOSIS — I509 Heart failure, unspecified: Secondary | ICD-10-CM | POA: Insufficient documentation

## 2023-10-16 DIAGNOSIS — R058 Other specified cough: Secondary | ICD-10-CM | POA: Diagnosis not present

## 2023-10-16 DIAGNOSIS — Z85118 Personal history of other malignant neoplasm of bronchus and lung: Secondary | ICD-10-CM | POA: Diagnosis not present

## 2023-10-16 DIAGNOSIS — J40 Bronchitis, not specified as acute or chronic: Secondary | ICD-10-CM | POA: Insufficient documentation

## 2023-10-16 DIAGNOSIS — Z853 Personal history of malignant neoplasm of breast: Secondary | ICD-10-CM | POA: Diagnosis not present

## 2023-10-16 DIAGNOSIS — G9331 Postviral fatigue syndrome: Secondary | ICD-10-CM | POA: Diagnosis not present

## 2023-10-16 DIAGNOSIS — B349 Viral infection, unspecified: Secondary | ICD-10-CM | POA: Diagnosis not present

## 2023-10-16 DIAGNOSIS — I517 Cardiomegaly: Secondary | ICD-10-CM | POA: Diagnosis not present

## 2023-10-16 DIAGNOSIS — R059 Cough, unspecified: Secondary | ICD-10-CM | POA: Diagnosis not present

## 2023-10-16 DIAGNOSIS — J111 Influenza due to unidentified influenza virus with other respiratory manifestations: Secondary | ICD-10-CM | POA: Diagnosis not present

## 2023-10-16 DIAGNOSIS — Z452 Encounter for adjustment and management of vascular access device: Secondary | ICD-10-CM | POA: Diagnosis not present

## 2023-10-16 LAB — BASIC METABOLIC PANEL
Anion gap: 11 (ref 5–15)
BUN: 17 mg/dL (ref 6–20)
CO2: 22 mmol/L (ref 22–32)
Calcium: 9.4 mg/dL (ref 8.9–10.3)
Chloride: 103 mmol/L (ref 98–111)
Creatinine, Ser: 1.1 mg/dL — ABNORMAL HIGH (ref 0.44–1.00)
GFR, Estimated: 60 mL/min — ABNORMAL LOW (ref >60)
Glucose, Bld: 101 mg/dL — ABNORMAL HIGH (ref 70–99)
Potassium: 4.4 mmol/L (ref 3.5–5.1)
Sodium: 136 mmol/L (ref 135–145)

## 2023-10-16 LAB — CBC
HCT: 39.9 % (ref 36.0–46.0)
Hemoglobin: 13 g/dL (ref 12.0–15.0)
MCH: 29.5 pg (ref 26.0–34.0)
MCHC: 32.6 g/dL (ref 30.0–36.0)
MCV: 90.5 fL (ref 80.0–100.0)
Platelets: 255 10*3/uL (ref 150–400)
RBC: 4.41 MIL/uL (ref 3.87–5.11)
RDW: 12.7 % (ref 11.5–15.5)
WBC: 9 10*3/uL (ref 4.0–10.5)
nRBC: 0 % (ref 0.0–0.2)

## 2023-10-16 MED ORDER — HYDROCOD POLI-CHLORPHE POLI ER 10-8 MG/5ML PO SUER: 5.0000 mL | Freq: Two times a day (BID) | ORAL | 0 refills | Status: DC | PRN

## 2023-10-16 NOTE — Discharge Instructions (Addendum)
 Please use Tylenol or ibuprofen for pain.  You may use 600 mg ibuprofen every 6 hours or 1000 mg of Tylenol every 6 hours.  You may choose to alternate between the 2.  This would be most effective.  Not to exceed 4 g of Tylenol within 24 hours.  Not to exceed 3200 mg ibuprofen 24 hours.  You can use the narcotic cough syrup up to twice daily, if you are driving or working you may not want to take it but you are safe to take it at night.  You can take the previous cough medicine that you are prescribed safely and still drive as long as you are not feeling too drowsy.  Please finish your course of steroids.

## 2023-10-16 NOTE — ED Provider Notes (Signed)
 [place] [facility] Provider Note   CSN: [id number] Arrival date & time: 10/16/23  1225     History  Chief Complaint  Patient presents with   Cough    [name] [name] is a 53 y.o. female past medical history significant for breast cancer with metastasis to lung, anemia, who is status post radiation therapy, chemotherapy, not currently undergoing treatment, history of CHF who presents with concern for productive cough that has not improved and going on several weeks to a month.  Currently taking prednisone and has 3 doses left, recently treated for an infection in the right breast and finished 2 weeks of antibiotics.  Taking promethazine cough syrup without significant relief.   Cough      Home Medications Prior to Admission medications   Medication Sig Start Date End Date Taking? Authorizing Provider  chlorpheniramine-HYDROcodone (TUSSIONEX) 10-8 MG/5ML Take 5 mLs by mouth every 12 (twelve) hours as needed for cough. 10/16/23  Yes Remmie Bembenek H, PA-C  acetaminophen (TYLENOL) 500 MG tablet Take 500 mg by mouth every 6 (six) hours as needed.    [provider]  Ascorbic Acid (VITAMIN C) 500 MG CHEW  06/14/19   [provider]  benzonatate (TESSALON) 200 MG capsule Take 1 capsule (200 mg total) by mouth 3 (three) times daily as needed for cough. 08/05/23   Carlean Jews, NP  Calcium-Phosphorus-Vitamin D (CALCIUM GUMMIES PO) Take by mouth daily.    [provider]  Camphor-Eucalyptus-Menthol (VICKS VAPORUB EX) Apply 1 application. topically at bedtime. Applies under nose, throat and on chest as needed    [provider]  celecoxib (CELEBREX) 100 MG capsule Take 1 capsule (100 mg total) by mouth 2 (two) times daily. 08/05/23   Burna Forts, MD  cetirizine (ZYRTEC) 10 MG tablet Take 10 mg by mouth daily as needed for allergies (allergies).     [provider]  doxycycline (VIBRAMYCIN) 100 MG  capsule Take 1 capsule (100 mg total) by mouth 2 (two) times daily. 09/30/23   Carlean Jews, NP  fluconazole (DIFLUCAN) 150 MG tablet Take 1 tablet po once for yeast infection. May repeat dose in 3 days for persistent symptoms 09/24/23   Carlean Jews, NP  fluticasone (FLONASE) 50 MCG/ACT nasal spray Place into both nostrils daily.    [provider]  gabapentin (NEURONTIN) 100 MG capsule TAKE 2 CAPSULES(200 MG) BY MOUTH AT BEDTIME 09/03/23   Vincent Gros E, NP  hydrocortisone 2.5 % cream Apply 1 application topically 2 (two) times daily as needed.    [provider]  ibuprofen (ADVIL,MOTRIN) 800 MG tablet Take 1 tablet (800 mg total) by mouth every 8 (eight) hours as needed. 04/08/17   Hudnall, Azucena Fallen, MD  lidocaine-prilocaine (EMLA) cream APPLY TO THE AFFECTED AREA PRIOR TO CHEMOTHERAPY AS DIRECTED 11/16/22   Heilingoetter, Cassandra L, PA-C  losartan (COZAAR) 25 MG tablet Take 1 tablet (25 mg total) by mouth daily. 04/01/23   Bensimhon, Bevelyn Buckles, MD  meloxicam (MOBIC) 7.5 MG tablet Take 7.5 mg by mouth daily.    [provider]  Multiple Vitamins-Minerals (MULTIVITAMIN GUMMIES ADULT PO) Take 1 each by mouth daily. Women's Vitafusion gummie    [provider]  spironolactone (ALDACTONE) 25 MG tablet TAKE 1 TABLET(25 MG) BY MOUTH DAILY 12/31/22   Bensimhon, Bevelyn Buckles, MD      Allergies    Adhesive [tape], Aspirin, and Codeine    Review of Systems  Review of Systems  Respiratory:  Positive for cough.   All other systems reviewed and are negative.   Physical Exam Updated Vital Signs BP (!) 141/86 (BP Location: Left Arm)   Pulse 97   Temp 97.9 F (36.6 C)   Resp 18   Ht 5\' 10"  (1.778 m)   Wt 107.5 kg   SpO2 98%   BMI 34.01 kg/m  Physical Exam Vitals and nursing note reviewed.  Constitutional:      General: She is not in acute distress.    Appearance: Normal appearance.  HENT:     Head: Normocephalic and atraumatic.  Eyes:     General:         Right eye: No discharge.        Left eye: No discharge.  Cardiovascular:     Rate and Rhythm: Normal rate and regular rhythm.     Heart sounds: No murmur heard.    No friction rub. No gallop.  Pulmonary:     Effort: Pulmonary effort is normal.     Breath sounds: Normal breath sounds.     Comments: No wheezing, rhonchi, focal consolidation, she does have a dry cough throughout Abdominal:     General: Bowel sounds are normal.     Palpations: Abdomen is soft.  Skin:    General: Skin is warm and dry.     Capillary Refill: Capillary refill takes less than 2 seconds.  Neurological:     Mental Status: She is alert and oriented to person, place, and time.  Psychiatric:        Mood and Affect: Mood normal.        Behavior: Behavior normal.     ED Results / Procedures / Treatments   Labs (all labs ordered are listed, but only abnormal results are displayed) Labs Reviewed  BASIC METABOLIC PANEL - Abnormal; Notable for the following components:      Result Value   Glucose, Bld 101 (*)    Creatinine, Ser 1.10 (*)    GFR, Estimated 60 (*)    All other components within normal limits  CBC    EKG None  Radiology No results found.  Procedures Procedures    Medications Ordered in ED Medications - No data to display  ED Course/ Medical Decision Making/ A&P                                 Medical Decision Making Amount and/or Complexity of Data Reviewed Labs: ordered. Radiology: ordered.   This patient is a 53 y.o. female who presents to the ED for concern of cough, congestion, nausea, vomiting.   Differential diagnoses prior to evaluation: breast cancer with metastasis to lung, anemia, who is status post radiation therapy, chemotherapy, not currently undergoing treatment, history of CHF  Past Medical History / Social History / Additional history: Chart reviewed. Pertinent results include: breast cancer with metastasis to lung, anemia, who is status post radiation  therapy, chemotherapy, not currently undergoing treatment, history of CHF  Physical Exam: Physical exam performed. The pertinent findings include: Vital signs stable in the emergency department other than some hypertension, blood pressure 141/86, no wheezing, rhonchi, stridor, rales.  Dry and persistent cough noted.  Labs/imaging: I independently interpreted CBC which shows no significant abnormality, notably with no leukocytosis, BMP also overall unremarkable other than mild elevated creatinine, 1.1 with normal BUN.  I independently interpreted plain film chest x-ray which appears  without significant change compared to baseline, she has some chronic interstitial changes noted in the right lower lung fields from her previous breast cancer and radiation, but no evidence of focal consolidation or other acute abnormality.  I agree with radiologist interpretation.  Medications / Treatment: She is already taking steroids, she recently finished a course of antibiotics, with no elevated white count I do not think that there is any evidence of inappropriately treated infection.  Symptoms consistent with postviral cough syndrome, ongoing bronchitis, will treat with Tussionex, encourage continued steroids.   Disposition: After consideration of the diagnostic results and the patients response to treatment, I feel that patient is stable for discharge with plan as above.   emergency department workup does not suggest an emergent condition requiring admission or immediate intervention beyond what has been performed at this time. The plan is: as above. The patient is safe for discharge and has been instructed to return immediately for worsening symptoms, change in symptoms or any other concerns.  Final Clinical Impression(s) / ED Diagnoses Final diagnoses:  Bronchitis  Post-viral cough syndrome    Rx / DC Orders ED Discharge Orders          Ordered    chlorpheniramine-HYDROcodone (TUSSIONEX) 10-8 MG/5ML   Every 12 hours PRN        10/16/23 1437              Meliyah Simon, Harrel Carina, PA-C 10/16/23 1449    Benjiman Core, MD 10/17/23 1311

## 2023-10-16 NOTE — ED Triage Notes (Signed)
 Dx w/ Flu last Thursday. C/o productive cough that hasn't improved. States "cough syrup has not worked". Denies fevers.

## 2023-10-27 NOTE — Assessment & Plan Note (Addendum)
 T3N1M1, stage IV, ER-/PR-/HER2+, [id number], NED -diagnosed 06/2014 with metastatic disease, s/p neoadjuvant chemo, right lumpectomy, adjuvant radiation and currently on Maintenance Herceptin since 04/12/15. Her restaging scan has showed NED -10/17/2022 - CTA chest - negative for PE or thoracic aortic dissection. No acute findings. There is subpleural scarring of anterior right upper lobe. No new nodules or new disease noted.  -10/17/2022 - CT abdomen and pelvis - colonic diverticulosis. Right renal stone without hydronephrosis.  -new restaging CT CAP due 10/2023.  -She has been on maintenance Herceptin for about 7.5 years.  She is probably cured, although I am not able to guarantee it. Will continue Herceptin for now, I would consider stopping therapy after 10 years -she continues to tolerate herceptin infusions well.  -03/20/2023 - patient complains of moderate to severe fatigue. She is unsure if this fatigue is similar to fatigue she has previously experienced related to chemotherapy treatments, or if this fatigue is worse. It has not been intrusive yet, but she feels like it will become intrusive soon. Increased concern because her aunt, also a breast cancer survivor, was diagnosed with liver cancer at the same time the patient was diagnosed with breast cancer, in 2015. The patient states that her aunt's only complaint was fatigue.  -reviewed CTA chest and CT abdomen and pelvis with the patient, done 10/2022. There was no evidence of metastatic or new disease noted.  -repeated CT CAP in 03/2023 was negative for recurrence, echo in 04/2023 showed normal EF   -Mammogram done 08/02/2023 with negative -Continue maintenance Herceptin every 28 days. -09/24/2023 -scheduled for mammogram and ultrasound of the right breast were ordered. Tests performed 09/27/2023, confirming diagnosis of cellulitis. Improving on antibiotics.  -09/30/2023 - proceed with Trastuzumab every 28 days.  -New echocardiogram scheduled for 10/14/2023.   This was rescheduled due to acute bronchitis. -Repeat CT CAP in June 2025

## 2023-10-27 NOTE — Progress Notes (Unsigned)
 Patient Care Team: [name], [name] as PCP - General (Family Medicine) Dorris Singh, DO (Family Medicine) Almond Lint, MD as Consulting Physician (General Surgery) Malachy Mood, MD as Consulting Physician (Hematology) Magda Kiel, MD (Obstetrics and Gynecology) Gala Romney, Bevelyn Buckles, MD as Consulting Physician (Cardiology)  Clinic Day:  10/28/2023  Referring physician: [name], PA  ASSESSMENT & PLAN:   Assessment & Plan: Breast cancer metastasized to lung T3N1M1, stage IV, ER-/PR-/HER2+, [id number], NED -diagnosed 06/2014 with metastatic disease, s/p neoadjuvant chemo, right lumpectomy, adjuvant radiation and currently on Maintenance Herceptin since 04/12/15. Her restaging scan has showed NED -10/17/2022 - CTA chest - negative for PE or thoracic aortic dissection. No acute findings. There is subpleural scarring of anterior right upper lobe. No new nodules or new disease noted.  -10/17/2022 - CT abdomen and pelvis - colonic diverticulosis. Right renal stone without hydronephrosis.  -new restaging CT CAP due 10/2023.  -She has been on maintenance Herceptin for about 7.5 years.  She is probably cured, although I am not able to guarantee it. Will continue Herceptin for now, I would consider stopping therapy after 10 years -she continues to tolerate herceptin infusions well.  -03/20/2023 - patient complains of moderate to severe fatigue. She is unsure if this fatigue is similar to fatigue she has previously experienced related to chemotherapy treatments, or if this fatigue is worse. It has not been intrusive yet, but she feels like it will become intrusive soon. Increased concern because her aunt, also a breast cancer survivor, was diagnosed with liver cancer at the same time the patient was diagnosed with breast cancer, in 2015. The patient states that her aunt's only complaint was fatigue.  -reviewed CTA chest and CT abdomen and pelvis with the patient, done 10/2022. There was no evidence  of metastatic or new disease noted.  -repeated CT CAP in 03/2023 was negative for recurrence, echo in 04/2023 showed normal EF   -Mammogram done 08/02/2023 with negative -Continue maintenance Herceptin every 28 days. -09/24/2023 -scheduled for mammogram and ultrasound of the right breast were ordered. Tests performed 09/27/2023, confirming diagnosis of cellulitis. Improving on antibiotics.  -09/30/2023 - proceed with Trastuzumab every 28 days.  -New echocardiogram scheduled for 11/04/2023 -Repeat CT CAP in June 2025   Cough Shortly after most recent visit, patient diagnosed with flu. Cough has been most significant symptom. Ended up being seein in Urgent Care. Chest x-ray negative for acute findings. Was given 12 day taper of prednisone. Was also put on tussionex for cough and albuterol rescue inhaler. She states that she started getting better, then symptoms became worse again. Promethazine DM cough suppressant, given by her PCP is not effective for the cough. Prednisone taper was complete a week ago, this past Saturday. Start medrol taper. Take as directed for 6 days. New prescription for tussionex cough suppressant. This may be taken up to twice daily as needed for cough, but advised her against taking this if working or having to drive anywhere. Will cause drowsiness and/or dizziness. She voiced understanding and agreement.   Cellulitis of breast This as resolved with doxycycline 100 mg twice daily. Diagnostic mammogram and ultrasound yielded benign results and were reviewed with the patient    Plan:  Proceed with trastuzumab infusion today.  New echocardiogram scheduled for 11/04/2023.  New CT CAP should be scheduled for 01/2024.  Continue with trastuzumab every 28 days  Add lab appointment to follow up and treatment in 12/2023.    The patient understands the plans discussed today and  is in agreement with them.  She knows to contact our office if she develops concerns prior to her next  appointment.  I provided 30 minutes of face-to-face time during this encounter and > 50% was spent counseling as documented under my assessment and plan.    Carlean Jews, NP  [facility] - A DEPT OF [facility] [address] [place] [address] Dept: [phone] Dept Fax: [phone]   Orders Placed This Encounter  Procedures   CT CHEST ABDOMEN PELVIS W CONTRAST    Standing Status:   Future    Expected Date:   01/28/2024    Expiration Date:   10/27/2024    If indicated for the ordered procedure, I authorize the administration of contrast media per Radiology protocol:   Yes    Does the patient have a contrast media/X-ray dye allergy?:   No    Preferred imaging location?:   [facility]    If indicated for the ordered procedure, I authorize the administration of oral contrast media per Radiology protocol:   Yes      CHIEF COMPLAINT:  CC: Right breast cancer  Current Treatment: Trastuzumab every 28 days  INTERVAL HISTORY:  [name] [name] is here today for repeat clinical assessment. She denies fevers or chills.  She was last seen by me on 09/24/2023 due to cellulitis of the right breast.  Treated with doxycycline 100 mg twice daily.  She underwent diagnostic mammogram and ultrasound of the right breast with benign results, consistent with cellulitis.  This has resolved. Was subsequently treated for flu by her PCP. When no improvement, was seen in ED. Chest x-ray negative for acute abnormalities. Has completed tamiflu, prednisone taper, and cough suppressant. She continues to use albuterol inhaler when needed. Just doesn't feel like she is improving. Echocardiogram had to be rescheduled because of cough. Now set up for 11/04/2023. Having trouble talking with people as she coughs if talking too much. Taking deep breaths also increases cough. She denies chest pain, except with cough, chest pressure, or shortness of breath.  She denies headaches or visual disturbances. She denies abdominal pain, nausea, vomiting, or changes in bowel or bladder habits.  She denies pain. Her appetite is good. Her weight has been stable.  I have reviewed the past medical history, past surgical history, social history and family history with the patient and they are unchanged from previous note.  ALLERGIES:  is allergic to adhesive [tape], aspirin, and codeine.  MEDICATIONS:  Current Outpatient Medications  Medication Sig Dispense Refill   acetaminophen (TYLENOL) 500 MG tablet Take 500 mg by mouth every 6 (six) hours as needed.     Ascorbic Acid (VITAMIN C) 500 MG CHEW      benzonatate (TESSALON) 200 MG capsule Take 1 capsule (200 mg total) by mouth 3 (three) times daily as needed for cough. 20 capsule 0   Calcium-Phosphorus-Vitamin D (CALCIUM GUMMIES PO) Take by mouth daily.     Camphor-Eucalyptus-Menthol (VICKS VAPORUB EX) Apply 1 application. topically at bedtime. Applies under nose, throat and on chest as needed     celecoxib (CELEBREX) 100 MG capsule Take 1 capsule (100 mg total) by mouth 2 (two) times daily. 60 capsule 0   cetirizine (ZYRTEC) 10 MG tablet Take 10 mg by mouth daily as needed for allergies (allergies).      chlorpheniramine-HYDROcodone (TUSSIONEX) 10-8 MG/5ML Take 5 mLs by mouth every 12 (twelve) hours as needed  for cough. 115 mL 0   doxycycline (VIBRAMYCIN) 100 MG capsule Take 1 capsule (100 mg total) by mouth 2 (two) times daily. 10 capsule 0   fluconazole (DIFLUCAN) 150 MG tablet Take 1 tablet po once for yeast infection. May repeat dose in 3 days for persistent symptoms 3 tablet 1   fluticasone (FLONASE) 50 MCG/ACT nasal spray Place into both nostrils daily.     gabapentin (NEURONTIN) 100 MG capsule TAKE 2 CAPSULES(200 MG) BY MOUTH AT BEDTIME 60 capsule 1   hydrocortisone 2.5 % cream Apply 1 application topically 2 (two) times daily as needed.     ibuprofen (ADVIL,MOTRIN) 800 MG tablet Take 1 tablet (800 mg  total) by mouth every 8 (eight) hours as needed. 60 tablet 1   lidocaine-prilocaine (EMLA) cream APPLY TO THE AFFECTED AREA PRIOR TO CHEMOTHERAPY AS DIRECTED 30 g 2   losartan (COZAAR) 25 MG tablet Take 1 tablet (25 mg total) by mouth daily. 90 tablet 1   meloxicam (MOBIC) 7.5 MG tablet Take 7.5 mg by mouth daily.     methylPREDNISolone (MEDROL DOSEPAK) 4 MG TBPK tablet Take as directed on package. 21 tablet 0   Multiple Vitamins-Minerals (MULTIVITAMIN GUMMIES ADULT PO) Take 1 each by mouth daily. Women's Vitafusion gummie     spironolactone (ALDACTONE) 25 MG tablet TAKE 1 TABLET(25 MG) BY MOUTH DAILY 30 tablet 0   No current facility-administered medications for this visit.   Facility-Administered Medications Ordered in Other Visits  Medication Dose Route Frequency Provider Last Rate Last Admin   heparin lock flush 100 unit/mL  500 Units Intracatheter Once PRN Malachy Mood, MD       sodium chloride 0.9 % injection 10 mL  10 mL Intravenous PRN Malachy Mood, MD       sodium chloride flush (NS) 0.9 % injection 10 mL  10 mL Intravenous PRN Malachy Mood, MD       sodium chloride flush (NS) 0.9 % injection 10 mL  10 mL Intracatheter PRN Malachy Mood, MD       trastuzumab-anns Ernestine Mcmurray) 600 mg in sodium chloride 0.9 % 250 mL chemo infusion  6 mg/kg (Treatment Plan Recorded) Intravenous Once Malachy Mood, MD        HISTORY OF PRESENT ILLNESS:   Oncology History Overview Note  Breast cancer metastasized to lung   Staging form: Breast, AJCC 7th Edition     Clinical stage from 07/22/2014: Stage IV (T3, N1, M1) - Unsigned     Breast cancer metastasized to lung (HCC)  07/02/2014 Mammogram   Mammogram showed a 2cm right beast mass and a 1.8cm right axillary node. MRI breast on 07/16/2014 showed 7cm R breast lesion and 4.4cm r axillary node    07/02/2014 Imaging   CT CAP: a 4.7cm mass in LUL lung and a 2.1cm mas in RML, and a small nodule in RUL, suspecious for metastasis     07/09/2014 Initial Diagnosis    right IDA with b/l lung lesions, ER-/PR-/HER2+   07/09/2014 Initial Biopsy   US guided right breast mass and axillary node biopsy showed IDA, and DCIS, ER-/PR-/HER2+   07/26/2014 Pathologic Stage   Left lung mass by IR, path revealed high grade carcinoma, morphology similar to breast tumor biopsy, TTF(-), NapsinA(-), ER(-)   08/04/2014 - 03/22/2015 Chemotherapy   weekly Paclitaxel 80mg /m2, trastuzumab and pertuzumab every 3 weeks   08/30/2014 Genetic Testing   BreastNext panel was negative. 17 genes including BRCA1, BRCA2, were negative for mutations.    10/04/2014 Imaging  Interval decrease in the right axillary lymphadenopathy. Bilateral pulmonary lesions with left hilar lymphadenopathy also markedly decreased in the interval. The left hilar lymphadenopathy has resolved.   12/20/2014 Imaging   restaging CT showed stable disease, no new lesions    03/29/2015 Pathology Results    right breast lumpectomy showed  chemotherapy treatment effect,  a 1 mm residual tumor,   margins were widely negative, 5 sentinel lymph nodes and 2 axillary lymph nodes were negative.    03/29/2015 Surgery    right breast lumpectomy and sentinel lymph node biopsy  by Dr. Donell Beers   04/12/2015 -  Chemotherapy   Herceptin maintenance therapy , 6 mg/kg, every 3 weeks since 04/12/15. Switched to Herceptin Hylecta injection q3weeks after 06/10/19.   04/12/2015 - 10/04/2022 Chemotherapy   Patient is on Treatment Plan : BREAST Trastuzumab q21d     05/03/2015 - 06/14/2015 Radiation Therapy   right breast adjuvant irradiation by Dr. Dayton Scrape    08/28/2016 Imaging   CT CAP w Contrast  IMPRESSION: 1. No acute process or evidence of metastatic disease within the chest, abdomen, or pelvis. 2. Similar to less well-defined left upper lobe density, likely scarring. No evidence of new or progressive pulmonary metastasis. 3. Right nephrolithiasis.   01/22/2017 Imaging   CT Chest W Contrast 01/22/17 IMPRESSION: 1. Stable exam. No  new or progressive findings. No evidence for metastatic disease. 2. Left upper lobe architectural distortion/scarring is stable. 3. Nonobstructing right renal stone.   07/31/2017 Imaging   Bone Scan Whole Body 07/31/17  IMPRESSION: 1. No scintigraphic evidence of osseous metastatic disease. 2. Thoracolumbar scoliosis.    07/31/2017 Imaging   CT CAP W Contrast 07/31/17 IMPRESSION: 1. No findings of active or recurrent malignancy. 2. Other imaging findings of potential clinical significance: Aortic Atherosclerosis (ICD10-I70.0). Mild cardiomegaly. Postoperative and radiation therapy findings in the right chest. Nonobstructive right nephrolithiasis. Thoracolumbar scoliosis.     01/20/2018 Echocardiogram   ECHO 01/20/2018 Study Conclusions   - Left ventricle: The cavity size was normal. Wall thickness was   normal. Systolic function was normal. The estimated ejection   fraction was in the range of 55% to 60%. Wall motion was normal;   there were no regional wall motion abnormalities. Doppler   parameters are consistent with abnormal left ventricular   relaxation (grade 1 diastolic dysfunction). - Impressions: GLS -20.7% LS&' 10.4 cm.    02/17/2018 Imaging   02/17/2018 CT CAP IMPRESSION: 1. No evidence for residual or recurrent tumor or metastatic disease. 2.  Aortic Atherosclerosis (ICD10-I70.0). 3. Right renal calculi. 4. Scoliosis.   08/11/2018 Imaging   CT CAP W Contrast 08/11/18  IMPRESSION: No evidence for localized recurrence or metastatic disease in the chest, abdomen or pelvis.   02/23/2019 Imaging   CT CAP W Contrast  IMPRESSION: 1. No findings of active malignancy in the chest, abdomen, or pelvis. 2. 3 mm right kidney lower pole nonobstructive renal calculus. 3. Thoracolumbar scoliosis.   12/08/2019 Imaging   CT CAP W contrast  IMPRESSION: 1. Unchanged post treatment appearance of the lungs. No evidence of recurrent or new metastatic disease in the chest,  abdomen, or pelvis.   2. Postoperative findings of right lumpectomy and axillary lymph node dissection.   3.  Nonobstructive right nephrolithiasis.   11/17/2020 Imaging   CT CAP IMPRESSION: 1. No evidence of recurrence or new metastatic disease within the chest, abdomen, or pelvis. 2. Similar post treatment changes in the lungs and postoperative findings of right lumpectomy and axillary lymph node  dissection. 3. Colonic diverticulosis without findings of acute diverticulitis.   08/10/2021 Genetic Testing   Negative genetic testing on the CancerNext-Expanded+RNAinsight panel.  The report date is August 10, 2021.  The CancerNext-Expanded gene panel offered by [facility] and includes sequencing and rearrangement analysis for the following 77 genes: AIP, ALK, APC*, ATM*, AXIN2, BAP1, BARD1, BLM, BMPR1A, BRCA1*, BRCA2*, BRIP1*, CDC73, CDH1*, CDK4, CDKN1B, CDKN2A, CHEK2*, CTNNA1, DICER1, FANCC, FH, FLCN, GALNT12, KIF1B, LZTR1, MAX, MEN1, MET, MLH1*, MSH2*, MSH3, MSH6*, MUTYH*, NBN, NF1*, NF2, NTHL1, PALB2*, PHOX2B, PMS2*, POT1, PRKAR1A, PTCH1, PTEN*, RAD51C*, RAD51D*, RB1, RECQL, RET, SDHA, SDHAF2, SDHB, SDHC, SDHD, SMAD4, SMARCA4, SMARCB1, SMARCE1, STK11, SUFU, TMEM127, TP53*, TSC1, TSC2, VHL and XRCC2 (sequencing and deletion/duplication); EGFR, EGLN1, HOXB13, KIT, MITF, PDGFRA, POLD1, and POLE (sequencing only); EPCAM and GREM1 (deletion/duplication only). DNA and RNA analyses performed for * genes.    10/17/2022 Imaging   CTA Chest and CT abdomen and pelvis with contrast  IMPRESSION: 1. Negative for acute PE or thoracic aortic dissection. 2. No acute findings. 3. Colonic diverticulosis. 4. Right nephrolithiasis without hydronephrosis.   10/25/2022 - 10/25/2022 Chemotherapy   Patient is on Treatment Plan : BREAST MAINTENANCE Trastuzumab IV (6) or SQ (600) D1 q21d X 11 Cycles     10/25/2022 -  Chemotherapy   Patient is on Treatment Plan : BREAST MAINTENANCE Trastuzumab IV (6) or SQ (600)  D1 q21d x 13 cycles         REVIEW OF SYSTEMS:   Constitutional: Denies fevers, chills or abnormal weight loss. Fatigue  Eyes: Denies blurriness of vision Ears, nose, mouth, throat, and face: Denies mucositis or sore throat Respiratory: persistent cough, especially with talking or taking deep breaths.  Cardiovascular: Denies palpitation, chest discomfort or lower extremity swelling Gastrointestinal:  Denies nausea, heartburn or change in bowel habits Skin: Denies abnormal skin rashes Lymphatics: Denies new lymphadenopathy or easy bruising Neurological:Denies numbness, tingling or new weaknesses Behavioral/Psych: Mood is stable, no new changes  All other systems were reviewed with the patient and are negative.   VITALS:   Today's Vitals   10/28/23 1316  BP: 120/70  Pulse: 99  Resp: 18  Temp: 97.8 F (36.6 C)  TempSrc: Temporal  SpO2: 99%  Weight: 237 lb 12.8 oz (107.9 kg)  PainSc: 0-No pain   Body mass index is 34.12 kg/m.   Wt Readings from Last 3 Encounters:  10/28/23 237 lb 12.8 oz (107.9 kg)  10/16/23 237 lb (107.5 kg)  09/30/23 238 lb 14.4 oz (108.4 kg)    Body mass index is 34.12 kg/m.  Performance status (ECOG): 1 - Symptomatic but completely ambulatory  PHYSICAL EXAM:   GENERAL:alert, no distress and comfortable SKIN: skin color, texture, turgor are normal, no rashes or significant lesions EYES: normal, Conjunctiva are pink and non-injected, sclera clear OROPHARYNX:no exudate, no erythema and lips, buccal mucosa, and tongue normal  NECK: supple, thyroid normal size, non-tender, without nodularity LYMPH:  no palpable lymphadenopathy in the cervical, axillary or inguinal LUNGS: clear to auscultation and percussion with normal breathing effort. She does have congested, non-productive cough noted.  HEART: regular rate & rhythm and no murmurs and no lower extremity edema ABDOMEN:abdomen soft, non-tender and normal bowel sounds Musculoskeletal:no cyanosis of  digits and no clubbing  NEURO: alert & oriented x 3 with fluent speech, no focal motor/sensory deficits BREAST: right breast is without palpable mass or lump. There are well-healed lumpectomy scars. No further evidence of cellulitis. No nipple inversion or discharge. There is no axillary lymphadenopathy  on the right. Left breast is without palpable mass or lump. No nipple inversion or discharge. There is no axillary lymphadenopathy on the left.   LABORATORY DATA:  I have reviewed the data as listed    Component Value Date/Time   NA 136 10/16/2023 1346   NA 141 08/01/2017 1305   K 4.4 10/16/2023 1346   K 3.6 08/01/2017 1305   CL 103 10/16/2023 1346   CO2 22 10/16/2023 1346   CO2 27 08/01/2017 1305   GLUCOSE 101 (H) 10/16/2023 1346   GLUCOSE 108 08/01/2017 1305   BUN 17 10/16/2023 1346   BUN 10.5 08/01/2017 1305   CREATININE 1.10 (H) 10/16/2023 1346   CREATININE 1.00 09/24/2023 0850   CREATININE 0.9 08/01/2017 1305   CALCIUM 9.4 10/16/2023 1346   CALCIUM 9.4 08/01/2017 1305   PROT 8.5 (H) 09/24/2023 0850   PROT 7.6 08/01/2017 1305   ALBUMIN 4.2 09/24/2023 0850   ALBUMIN 3.6 08/01/2017 1305   AST 34 09/24/2023 0850   AST 21 08/01/2017 1305   ALT 37 09/24/2023 0850   ALT 26 08/01/2017 1305   ALKPHOS 78 09/24/2023 0850   ALKPHOS 73 08/01/2017 1305   BILITOT 0.4 09/24/2023 0850   BILITOT 0.25 08/01/2017 1305   GFRNONAA 60 (L) 10/16/2023 1346   GFRNONAA >60 09/24/2023 0850   GFRAA >60 03/30/2020 1343    Lab Results  Component Value Date   WBC 9.0 10/16/2023   NEUTROABS 4.4 09/24/2023   HGB 13.0 10/16/2023   HCT 39.9 10/16/2023   MCV 90.5 10/16/2023   PLT 255 10/16/2023     RADIOGRAPHIC STUDIES: DG Chest 2 View Result Date: 10/16/2023 CLINICAL DATA:  Productive cough.  Diagnosed with flu 6 days ago. EXAM: CHEST - 2 VIEW COMPARISON:  06/18/2023 FINDINGS: Interval borderline enlarged cardiac silhouette. Stable left subclavian porta catheter with its tip in the superior vena  cava. Clear lungs with normal vascularity. Stable moderate dextroconvex thoracic scoliosis. Right axillary surgical clips. IMPRESSION: 1. Interval borderline cardiomegaly. 2. No acute abnormality. Electronically Signed   By: Beckie Salts M.D.   On: 10/16/2023 16:00

## 2023-10-28 ENCOUNTER — Encounter: Payer: Self-pay | Admitting: Nurse Practitioner

## 2023-10-28 ENCOUNTER — Other Ambulatory Visit: Payer: Self-pay

## 2023-10-28 ENCOUNTER — Inpatient Hospital Stay: Payer: BC Managed Care – PPO | Attending: Hematology

## 2023-10-28 ENCOUNTER — Inpatient Hospital Stay (HOSPITAL_BASED_OUTPATIENT_CLINIC_OR_DEPARTMENT_OTHER): Payer: BC Managed Care – PPO | Attending: Hematology | Admitting: Nurse Practitioner

## 2023-10-28 VITALS — BP 120/70 | HR 99 | Temp 97.8°F | Resp 18 | Wt 237.8 lb

## 2023-10-28 DIAGNOSIS — C50911 Malignant neoplasm of unspecified site of right female breast: Secondary | ICD-10-CM | POA: Insufficient documentation

## 2023-10-28 DIAGNOSIS — C7800 Secondary malignant neoplasm of unspecified lung: Secondary | ICD-10-CM | POA: Insufficient documentation

## 2023-10-28 DIAGNOSIS — Z1722 Progesterone receptor negative status: Secondary | ICD-10-CM | POA: Diagnosis not present

## 2023-10-28 DIAGNOSIS — Z5112 Encounter for antineoplastic immunotherapy: Secondary | ICD-10-CM | POA: Insufficient documentation

## 2023-10-28 DIAGNOSIS — Z171 Estrogen receptor negative status [ER-]: Secondary | ICD-10-CM | POA: Diagnosis not present

## 2023-10-28 DIAGNOSIS — R058 Other specified cough: Secondary | ICD-10-CM | POA: Insufficient documentation

## 2023-10-28 MED ORDER — METHYLPREDNISOLONE 4 MG PO TBPK: ORAL_TABLET | ORAL | 0 refills | Status: DC

## 2023-10-28 MED ORDER — HEPARIN SOD (PORK) LOCK FLUSH 100 UNIT/ML IV SOLN
500.0000 [IU] | Freq: Once | INTRAVENOUS | Status: AC | PRN
  Administered 2023-10-28: 500 [IU]

## 2023-10-28 MED ORDER — HYDROCOD POLI-CHLORPHE POLI ER 10-8 MG/5ML PO SUER: 5.0000 mL | Freq: Two times a day (BID) | ORAL | 0 refills | Status: DC | PRN

## 2023-10-28 MED ORDER — SODIUM CHLORIDE 0.9 % IV SOLN
Freq: Once | INTRAVENOUS | Status: AC
Start: 2023-10-28 — End: 2023-10-28

## 2023-10-28 MED ORDER — SODIUM CHLORIDE 0.9% FLUSH
10.0000 mL | INTRAVENOUS | Status: DC | PRN
  Administered 2023-10-28: 10 mL

## 2023-10-28 MED ORDER — SODIUM CHLORIDE 0.9 % IV SOLN
6.0000 mg/kg | Freq: Once | INTRAVENOUS | Status: AC
  Administered 2023-10-28: 600 mg via INTRAVENOUS
  Filled 2023-10-28: qty 28.57

## 2023-10-28 NOTE — Addendum Note (Signed)
 Addended by: Vincent Gros on: 10/28/2023 03:35 PM   Modules accepted: Orders

## 2023-10-28 NOTE — Patient Instructions (Signed)
 [facility] - A DEPT OF [facility]  Discharge Instructions: Thank you for choosing [facility] to provide your oncology and hematology care.   If you have a lab appointment with the Cancer Center, please go directly to the Cancer Center and check in at the registration area.   Wear comfortable clothing and clothing appropriate for easy access to any Portacath or PICC line.   We strive to give you quality time with your provider. You may need to reschedule your appointment if you arrive late (15 or more minutes).  Arriving late affects you and other patients whose appointments are after yours.  Also, if you miss three or more appointments without notifying the office, you may be dismissed from the clinic at the provider's discretion.      For prescription refill requests, have your pharmacy contact our office and allow 72 hours for refills to be completed.    Today you received the following chemotherapy and/or immunotherapy agents: Kanjinti      To help prevent nausea and vomiting after your treatment, we encourage you to take your nausea medication as directed.  BELOW ARE SYMPTOMS THAT SHOULD BE REPORTED IMMEDIATELY: *FEVER GREATER THAN 100.4 F (38 C) OR HIGHER *CHILLS OR SWEATING *NAUSEA AND VOMITING THAT IS NOT CONTROLLED WITH YOUR NAUSEA MEDICATION *UNUSUAL SHORTNESS OF BREATH *UNUSUAL BRUISING OR BLEEDING *URINARY PROBLEMS (pain or burning when urinating, or frequent urination) *BOWEL PROBLEMS (unusual diarrhea, constipation, pain near the anus) TENDERNESS IN MOUTH AND THROAT WITH OR WITHOUT PRESENCE OF ULCERS (sore throat, sores in mouth, or a toothache) UNUSUAL RASH, SWELLING OR PAIN  UNUSUAL VAGINAL DISCHARGE OR ITCHING   Items with * indicate a potential emergency and should be followed up as soon as possible or go to the Emergency Department if any problems should occur.  Please show the CHEMOTHERAPY ALERT CARD or IMMUNOTHERAPY  ALERT CARD at check-in to the Emergency Department and triage nurse.  Should you have questions after your visit or need to cancel or reschedule your appointment, please contact [facility] - A DEPT OF [facility]  Dept: [phone]  and follow the prompts.  Office hours are 8:00 a.m. to 4:30 p.m. Monday - Friday. Please note that voicemails left after 4:00 p.m. may not be returned until the following business day.  We are closed weekends and major holidays. You have access to a nurse at all times for urgent questions. Please call the main number to the clinic Dept: [phone] and follow the prompts.   For any non-urgent questions, you may also contact your provider using MyChart. We now offer e-Visits for anyone 60 and older to request care online for non-urgent symptoms. For details visit [email or web address].   Also download the MyChart app! Go to the app store, search "MyChart", open the app, select [facility], and log in with your MyChart username and password.

## 2023-10-29 ENCOUNTER — Other Ambulatory Visit (HOSPITAL_COMMUNITY): Payer: Self-pay

## 2023-10-29 MED ORDER — LOSARTAN POTASSIUM 25 MG PO TABS: 25.0000 mg | ORAL_TABLET | Freq: Every day | ORAL | 0 refills | Status: DC

## 2023-10-30 ENCOUNTER — Other Ambulatory Visit: Payer: Self-pay

## 2023-11-04 ENCOUNTER — Ambulatory Visit (HOSPITAL_COMMUNITY)
Admission: RE | Admit: 2023-11-04 | Discharge: 2023-11-04 | Disposition: A | Discharge status: Home / Self Care | Source: Ambulatory Visit | Attending: Hematology

## 2023-11-04 ENCOUNTER — Other Ambulatory Visit (HOSPITAL_COMMUNITY): Payer: Self-pay

## 2023-11-04 DIAGNOSIS — Z0189 Encounter for other specified special examinations: Secondary | ICD-10-CM | POA: Diagnosis not present

## 2023-11-04 DIAGNOSIS — C50911 Malignant neoplasm of unspecified site of right female breast: Secondary | ICD-10-CM

## 2023-11-04 DIAGNOSIS — I509 Heart failure, unspecified: Secondary | ICD-10-CM | POA: Insufficient documentation

## 2023-11-04 DIAGNOSIS — Z79899 Other long term (current) drug therapy: Secondary | ICD-10-CM | POA: Insufficient documentation

## 2023-11-04 DIAGNOSIS — I10 Essential (primary) hypertension: Secondary | ICD-10-CM

## 2023-11-04 DIAGNOSIS — C7800 Secondary malignant neoplasm of unspecified lung: Secondary | ICD-10-CM

## 2023-11-04 LAB — ECHOCARDIOGRAM COMPLETE
AR max vel: 1.51 cm2
AV Area VTI: 1.49 cm2
AV Area mean vel: 1.51 cm2
AV Mean grad: 5 mmHg
AV Peak grad: 8 mmHg
Ao pk vel: 1.41 m/s
Area-P 1/2: 4.1 cm2
S' Lateral: 2.4 cm

## 2023-11-04 MED ORDER — LOSARTAN POTASSIUM 25 MG PO TABS: 25.0000 mg | ORAL_TABLET | Freq: Every day | ORAL | 0 refills | Status: DC

## 2023-11-25 ENCOUNTER — Inpatient Hospital Stay: Payer: BC Managed Care – PPO | Attending: Hematology

## 2023-11-25 ENCOUNTER — Other Ambulatory Visit

## 2023-11-25 VITALS — BP 143/86 | HR 85 | Temp 98.4°F | Resp 18 | Wt 237.2 lb

## 2023-11-25 DIAGNOSIS — C50911 Malignant neoplasm of unspecified site of right female breast: Secondary | ICD-10-CM | POA: Insufficient documentation

## 2023-11-25 DIAGNOSIS — Z5112 Encounter for antineoplastic immunotherapy: Secondary | ICD-10-CM | POA: Diagnosis not present

## 2023-11-25 MED ORDER — SODIUM CHLORIDE 0.9% FLUSH
10.0000 mL | INTRAVENOUS | Status: DC | PRN
  Administered 2023-11-25: 10 mL

## 2023-11-25 MED ORDER — SODIUM CHLORIDE 0.9 % IV SOLN: Freq: Once | INTRAVENOUS | Status: AC

## 2023-11-25 MED ORDER — TRASTUZUMAB-ANNS CHEMO 150 MG IV SOLR
6.0000 mg/kg | Freq: Once | INTRAVENOUS | Status: AC
  Administered 2023-11-25: 600 mg via INTRAVENOUS
  Filled 2023-11-25: qty 28.57

## 2023-11-25 MED ORDER — HEPARIN SOD (PORK) LOCK FLUSH 100 UNIT/ML IV SOLN
500.0000 [IU] | Freq: Once | INTRAVENOUS | Status: AC | PRN
  Administered 2023-11-25: 500 [IU]

## 2023-11-25 NOTE — Patient Instructions (Signed)
 [facility] - A DEPT OF [facility]  Discharge Instructions: Thank you for choosing [facility] to provide your oncology and hematology care.   If you have a lab appointment with the Cancer Center, please go directly to the Cancer Center and check in at the registration area.   Wear comfortable clothing and clothing appropriate for easy access to any Portacath or PICC line.   We strive to give you quality time with your provider. You may need to reschedule your appointment if you arrive late (15 or more minutes).  Arriving late affects you and other patients whose appointments are after yours.  Also, if you miss three or more appointments without notifying the office, you may be dismissed from the clinic at the provider's discretion.      For prescription refill requests, have your pharmacy contact our office and allow 72 hours for refills to be completed.    Today you received the following chemotherapy and/or immunotherapy agents: Kanjinti      To help prevent nausea and vomiting after your treatment, we encourage you to take your nausea medication as directed.  BELOW ARE SYMPTOMS THAT SHOULD BE REPORTED IMMEDIATELY: *FEVER GREATER THAN 100.4 F (38 C) OR HIGHER *CHILLS OR SWEATING *NAUSEA AND VOMITING THAT IS NOT CONTROLLED WITH YOUR NAUSEA MEDICATION *UNUSUAL SHORTNESS OF BREATH *UNUSUAL BRUISING OR BLEEDING *URINARY PROBLEMS (pain or burning when urinating, or frequent urination) *BOWEL PROBLEMS (unusual diarrhea, constipation, pain near the anus) TENDERNESS IN MOUTH AND THROAT WITH OR WITHOUT PRESENCE OF ULCERS (sore throat, sores in mouth, or a toothache) UNUSUAL RASH, SWELLING OR PAIN  UNUSUAL VAGINAL DISCHARGE OR ITCHING   Items with * indicate a potential emergency and should be followed up as soon as possible or go to the Emergency Department if any problems should occur.  Please show the CHEMOTHERAPY ALERT CARD or IMMUNOTHERAPY  ALERT CARD at check-in to the Emergency Department and triage nurse.  Should you have questions after your visit or need to cancel or reschedule your appointment, please contact [facility] - A DEPT OF [facility]  Dept: [phone]  and follow the prompts.  Office hours are 8:00 a.m. to 4:30 p.m. Monday - Friday. Please note that voicemails left after 4:00 p.m. may not be returned until the following business day.  We are closed weekends and major holidays. You have access to a nurse at all times for urgent questions. Please call the main number to the clinic Dept: [phone] and follow the prompts.   For any non-urgent questions, you may also contact your provider using MyChart. We now offer e-Visits for anyone 60 and older to request care online for non-urgent symptoms. For details visit [email or web address].   Also download the MyChart app! Go to the app store, search "MyChart", open the app, select [facility], and log in with your MyChart username and password.

## 2023-12-20 ENCOUNTER — Other Ambulatory Visit: Payer: Self-pay

## 2023-12-20 DIAGNOSIS — C50911 Malignant neoplasm of unspecified site of right female breast: Secondary | ICD-10-CM

## 2023-12-23 ENCOUNTER — Encounter: Payer: Self-pay | Admitting: Hematology

## 2023-12-23 ENCOUNTER — Inpatient Hospital Stay (HOSPITAL_BASED_OUTPATIENT_CLINIC_OR_DEPARTMENT_OTHER): Payer: BC Managed Care – PPO | Admitting: Hematology

## 2023-12-23 ENCOUNTER — Inpatient Hospital Stay: Payer: BC Managed Care – PPO | Attending: Hematology

## 2023-12-23 ENCOUNTER — Inpatient Hospital Stay

## 2023-12-23 VITALS — BP 136/64 | HR 87 | Temp 97.4°F | Resp 20 | Ht 70.0 in | Wt 244.4 lb

## 2023-12-23 DIAGNOSIS — C50911 Malignant neoplasm of unspecified site of right female breast: Secondary | ICD-10-CM

## 2023-12-23 DIAGNOSIS — C7800 Secondary malignant neoplasm of unspecified lung: Secondary | ICD-10-CM

## 2023-12-23 DIAGNOSIS — Z5112 Encounter for antineoplastic immunotherapy: Secondary | ICD-10-CM | POA: Diagnosis not present

## 2023-12-23 DIAGNOSIS — Z171 Estrogen receptor negative status [ER-]: Secondary | ICD-10-CM | POA: Insufficient documentation

## 2023-12-23 DIAGNOSIS — Z95828 Presence of other vascular implants and grafts: Secondary | ICD-10-CM

## 2023-12-23 LAB — CBC WITH DIFFERENTIAL (CANCER CENTER ONLY)
Abs Immature Granulocytes: 0.01 10*3/uL (ref 0.00–0.07)
Basophils Absolute: 0 10*3/uL (ref 0.0–0.1)
Basophils Relative: 1 %
Eosinophils Absolute: 0.1 10*3/uL (ref 0.0–0.5)
Eosinophils Relative: 2 %
HCT: 34.8 % — ABNORMAL LOW (ref 36.0–46.0)
Hemoglobin: 11.6 g/dL — ABNORMAL LOW (ref 12.0–15.0)
Immature Granulocytes: 0 %
Lymphocytes Relative: 36 %
Lymphs Abs: 2.2 10*3/uL (ref 0.7–4.0)
MCH: 29.7 pg (ref 26.0–34.0)
MCHC: 33.3 g/dL (ref 30.0–36.0)
MCV: 89 fL (ref 80.0–100.0)
Monocytes Absolute: 0.5 10*3/uL (ref 0.1–1.0)
Monocytes Relative: 8 %
Neutro Abs: 3.3 10*3/uL (ref 1.7–7.7)
Neutrophils Relative %: 53 %
Platelet Count: 248 10*3/uL (ref 150–400)
RBC: 3.91 MIL/uL (ref 3.87–5.11)
RDW: 13.6 % (ref 11.5–15.5)
WBC Count: 6.2 10*3/uL (ref 4.0–10.5)
nRBC: 0 % (ref 0.0–0.2)

## 2023-12-23 LAB — CMP (CANCER CENTER ONLY)
ALT: 25 U/L (ref 0–44)
AST: 13 U/L — ABNORMAL LOW (ref 15–41)
Albumin: 3.9 g/dL (ref 3.5–5.0)
Alkaline Phosphatase: 72 U/L (ref 38–126)
Anion gap: 6 (ref 5–15)
BUN: 12 mg/dL (ref 6–20)
CO2: 30 mmol/L (ref 22–32)
Calcium: 9.1 mg/dL (ref 8.9–10.3)
Chloride: 106 mmol/L (ref 98–111)
Creatinine: 0.84 mg/dL (ref 0.44–1.00)
GFR, Estimated: >60 mL/min (ref >60)
Glucose, Bld: 130 mg/dL — ABNORMAL HIGH (ref 70–99)
Potassium: 3.8 mmol/L (ref 3.5–5.1)
Sodium: 142 mmol/L (ref 135–145)
Total Bilirubin: 0.3 mg/dL (ref 0.0–1.2)
Total Protein: 7.3 g/dL (ref 6.5–8.1)

## 2023-12-23 MED ORDER — SODIUM CHLORIDE 0.9 % IV SOLN: Freq: Once | INTRAVENOUS | Status: AC

## 2023-12-23 MED ORDER — TRASTUZUMAB-ANNS CHEMO 150 MG IV SOLR
6.0000 mg/kg | Freq: Once | INTRAVENOUS | Status: AC
  Administered 2023-12-23: 600 mg via INTRAVENOUS
  Filled 2023-12-23: qty 28.57

## 2023-12-23 MED ORDER — HEPARIN SOD (PORK) LOCK FLUSH 100 UNIT/ML IV SOLN
500.0000 [IU] | Freq: Once | INTRAVENOUS | Status: AC | PRN
  Administered 2023-12-23: 500 [IU]

## 2023-12-23 MED ORDER — SODIUM CHLORIDE 0.9% FLUSH
10.0000 mL | INTRAVENOUS | Status: DC | PRN
  Administered 2023-12-23: 10 mL

## 2023-12-23 MED ORDER — SODIUM CHLORIDE 0.9% FLUSH
10.0000 mL | Freq: Once | INTRAVENOUS | Status: AC
  Administered 2023-12-23: 10 mL

## 2023-12-23 NOTE — Assessment & Plan Note (Signed)
T3N1M1, stage IV, ER-/PR-/HER2+, [id number], NED -diagnosed 06/2014 with metastatic disease, s/p neoadjuvant chemo, right lumpectomy, adjuvant radiation and currently on Maintenance Herceptin since 04/12/15. Her restaging scan has showed NED -10/17/2022 - CTA chest - negative for PE or thoracic aortic dissection. No acute findings. There is subpleural scarring of anterior right upper lobe. No new nodules or new disease noted.  -10/17/2022 - CT abdomen and pelvis - colonic diverticulosis. Right renal stone without hydronephrosis.  -new restaging CT CAP due 10/2023.  -She has been on maintenance Herceptin for about 7.5 years.  She is probably cured, although I am not able to approve it. Will continue Herceptin for now, I would consider stopping therapy after 10 years -she continues to tolerate herceptin infusions well.  -03/20/2023 - patient complains of moderate to severe fatigue. She is unsure if this fatigue is similar to fatigue she has previously experienced related to chemotherapy treatments, or if this fatigue is worse. It has not been intrusive yet, but she feels like it will become intrusive soon. Increased concern because her aunt, also a breast cancer survivor, was diagnosed with liver cancer at the same time the patient was diagnosed with breast cancer, in 2015. The patient states that her aunt's only complaint was fatigue.  -Labs reviewed, overall stable. CBC normal. Glucose 162 with remainder of CMP normal.  -continue labs/flush and herceptin infusions every 28 days.  -F/u in 4 months  -reviewed CTA chest and CT abdomen and pelvis with the patient, done 10/2022. There was no evidence of metastatic or new disease noted.  -repeated CT CAP in 03/2023 was negative for recurrence, echo in 04/2023 showed normal EF

## 2023-12-23 NOTE — Progress Notes (Signed)
 [facility]   Telephone:(336) [phone] [phone]   Clinic Follow up Note   Patient Care Team: Elias Gros, [name] as PCP - General (Family Medicine) Hanna Lewandowsky, DO (Family Medicine) Lockie Rima, MD as Consulting Physician (General Surgery) Sonja [place], MD as Consulting Physician (Hematology) Vester Gouty, MD (Obstetrics and Gynecology) Julane Ny, Rheta Celestine, MD as Consulting Physician (Cardiology) Mammography, [facility] as Radiologist (Diagnostic Radiology)  Date of Service:  12/23/2023  CHIEF COMPLAINT: f/u of metastatic breast cancer  CURRENT THERAPY:  Trastuzumab  every 4 weeks   Oncology History   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 Her restaging scan has showed NED -10/17/2022 - CTA chest - negative for PE or thoracic aortic dissection. No acute findings. There is subpleural scarring of anterior right upper lobe. No new nodules or new disease noted.  -10/17/2022 - CT abdomen and pelvis - colonic diverticulosis. Right renal stone without hydronephrosis.  -new restaging CT CAP due 10/2023.  -She has been on maintenance Herceptin  for about 7.5 years.  She is probably cured, although I am not able to approve it. Will continue Herceptin  for now, I would consider stopping therapy after 10 years -she continues to tolerate herceptin  infusions well.  -03/20/2023 - patient complains of moderate to severe fatigue. She is unsure if this fatigue is similar to fatigue she has previously experienced related to chemotherapy treatments, or if this fatigue is worse. It has not been intrusive yet, but she feels like it will become intrusive soon. Increased concern because her aunt, also a breast cancer survivor, was diagnosed with liver cancer at the same time the patient was  diagnosed with breast cancer, in 2015. The patient states that her aunt's only complaint was fatigue.  -Labs reviewed, overall stable. CBC normal. Glucose 162 with remainder of CMP normal.  -continue labs/flush and herceptin  infusions every 28 days.  -F/u in 4 months  -reviewed CTA chest and CT abdomen and pelvis with the patient, done 10/2022. There was no evidence of metastatic or new disease noted.  -repeated CT CAP in 03/2023 was negative for recurrence, echo in 04/2023 showed normal EF    Assessment & Plan Metastatic breast cancer Metastatic breast cancer, well-managed with no evidence of disease on scans. She has been on Herceptin  for approximately nine years following completion of chemotherapy in 2016. Discussed potential discontinuation of Herceptin  based on results from new blood tests for minimal residual disease (MRD). Two tests, Signatera and GuardianReveal were discussed to detect MRD in the blood. Both tests are approved for cancer monitoring and are expected to be covered by insurance, with minimal cost if not covered. The anticipated outcome is to confirm the absence of MRD, allowing for potential discontinuation of Herceptin . - Order Signatera test to sequence tumor DNA and detect MRD in blood on January 20, 2024. - Order Guardian test to detect cancer-specific signals in blood on January 20, 2024. - Schedule CT scan on January 27, 2024.   Plan - She is clinically doing well, will proceed trastuzumab  infusion today and continue every 4 weeks - Had a lab appointment in 4 weeks including Signatera - She is scheduled for restaging CT scan on January 27 2024 - Follow-up and treatment on July 7th to review the above test results      SUMMARY OF ONCOLOGIC HISTORY: Oncology History Overview Note  Breast cancer metastasized  to lung   Staging form: Breast, AJCC 7th Edition     Clinical stage from 07/22/2014: Stage IV (T3, N1, M1) - Unsigned     Breast cancer metastasized to lung (HCC)   07/02/2014 Mammogram   Mammogram showed a 2cm right beast mass and a 1.8cm right axillary node. MRI breast on 07/16/2014 showed 7cm R breast lesion and 4.4cm r axillary node    07/02/2014 Imaging   CT CAP: a 4.7cm mass in LUL lung and a 2.1cm mas in RML, and a small nodule in RUL, suspecious for metastasis     07/09/2014 Initial Diagnosis   right IDA with b/l lung lesions, ER-/PR-/HER2+   07/09/2014 Initial Biopsy   US  guided right breast mass and axillary node biopsy showed IDA, and DCIS, ER-/PR-/HER2+   07/26/2014 Pathologic Stage   Left lung mass by IR, path revealed high grade carcinoma, morphology similar to breast tumor biopsy, TTF(-), NapsinA(-), ER(-)   08/04/2014 - 03/22/2015 Chemotherapy   weekly Paclitaxel  80mg /m2, trastuzumab  and pertuzumab  every 3 weeks   08/30/2014 Genetic Testing   BreastNext panel was negative. 17 genes including BRCA1, BRCA2, were negative for mutations.    10/04/2014 Imaging   Interval decrease in the right axillary lymphadenopathy. Bilateral pulmonary lesions with left hilar lymphadenopathy also markedly decreased in the interval. The left hilar lymphadenopathy has resolved.   12/20/2014 Imaging   restaging CT showed stable disease, no new lesions    03/29/2015 Pathology Results    right breast lumpectomy showed  chemotherapy treatment effect,  a 1 mm residual tumor,   margins were widely negative, 5 sentinel lymph nodes and 2 axillary lymph nodes were negative.    03/29/2015 Surgery    right breast lumpectomy and sentinel lymph node biopsy  by Dr. Cherlynn Cornfield   04/12/2015 -  Chemotherapy   Herceptin  maintenance therapy , 6 mg/kg, every 3 weeks since 04/12/15. Switched to Herceptin  Hylecta injection q3weeks after 06/10/19.   04/12/2015 - 10/04/2022 Chemotherapy   Patient is on Treatment Plan : BREAST Trastuzumab  q21d     05/03/2015 - 06/14/2015 Radiation Therapy   right breast adjuvant irradiation by Dr. Ike Malady    08/28/2016 Imaging   CT CAP w  Contrast  IMPRESSION: 1. No acute process or evidence of metastatic disease within the chest, abdomen, or pelvis. 2. Similar to less well-defined left upper lobe density, likely scarring. No evidence of new or progressive pulmonary metastasis. 3. Right nephrolithiasis.   01/22/2017 Imaging   CT Chest W Contrast 01/22/17 IMPRESSION: 1. Stable exam. No new or progressive findings. No evidence for metastatic disease. 2. Left upper lobe architectural distortion/scarring is stable. 3. Nonobstructing right renal stone.   07/31/2017 Imaging   Bone Scan Whole Body 07/31/17  IMPRESSION: 1. No scintigraphic evidence of osseous metastatic disease. 2. Thoracolumbar scoliosis.    07/31/2017 Imaging   CT CAP W Contrast 07/31/17 IMPRESSION: 1. No findings of active or recurrent malignancy. 2. Other imaging findings of potential clinical significance: Aortic Atherosclerosis (ICD10-I70.0). Mild cardiomegaly. Postoperative and radiation therapy findings in the right chest. Nonobstructive right nephrolithiasis. Thoracolumbar scoliosis.     01/20/2018 Echocardiogram   ECHO 01/20/2018 Study Conclusions   - Left ventricle: The cavity size was normal. Wall thickness was   normal. Systolic function was normal. The estimated ejection   fraction was in the range of 55% to 60%. Wall motion was normal;   there were no regional wall motion abnormalities. Doppler   parameters are consistent with abnormal left ventricular   relaxation (  grade 1 diastolic dysfunction). - Impressions: GLS -20.7% LS&' 10.4 cm.    02/17/2018 Imaging   02/17/2018 CT CAP IMPRESSION: 1. No evidence for residual or recurrent tumor or metastatic disease. 2.  Aortic Atherosclerosis (ICD10-I70.0). 3. Right renal calculi. 4. Scoliosis.   08/11/2018 Imaging   CT CAP W Contrast 08/11/18  IMPRESSION: No evidence for localized recurrence or metastatic disease in the chest, abdomen or pelvis.   02/23/2019 Imaging   CT CAP W  Contrast  IMPRESSION: 1. No findings of active malignancy in the chest, abdomen, or pelvis. 2. 3 mm right kidney lower pole nonobstructive renal calculus. 3. Thoracolumbar scoliosis.   12/08/2019 Imaging   CT CAP W contrast  IMPRESSION: 1. Unchanged post treatment appearance of the lungs. No evidence of recurrent or new metastatic disease in the chest, abdomen, or pelvis.   2. Postoperative findings of right lumpectomy and axillary lymph node dissection.   3.  Nonobstructive right nephrolithiasis.   11/17/2020 Imaging   CT CAP IMPRESSION: 1. No evidence of recurrence or new metastatic disease within the chest, abdomen, or pelvis. 2. Similar post treatment changes in the lungs and postoperative findings of right lumpectomy and axillary lymph node dissection. 3. Colonic diverticulosis without findings of acute diverticulitis.   08/10/2021 Genetic Testing   Negative genetic testing on the CancerNext-Expanded+RNAinsight panel.  The report date is August 10, 2021.  The CancerNext-Expanded gene panel offered by [facility] and includes sequencing and rearrangement analysis for the following 77 genes: AIP, ALK, APC*, ATM*, AXIN2, BAP1, BARD1, BLM, BMPR1A, BRCA1*, BRCA2*, BRIP1*, CDC73, CDH1*, CDK4, CDKN1B, CDKN2A, CHEK2*, CTNNA1, DICER1, FANCC, FH, FLCN, GALNT12, KIF1B, LZTR1, MAX, MEN1, MET, MLH1*, MSH2*, MSH3, MSH6*, MUTYH*, NBN, NF1*, NF2, NTHL1, PALB2*, PHOX2B, PMS2*, POT1, PRKAR1A, PTCH1, PTEN*, RAD51C*, RAD51D*, RB1, RECQL, RET, SDHA, SDHAF2, SDHB, SDHC, SDHD, SMAD4, SMARCA4, SMARCB1, SMARCE1, STK11, SUFU, TMEM127, TP53*, TSC1, TSC2, VHL and XRCC2 (sequencing and deletion/duplication); EGFR, EGLN1, HOXB13, KIT, MITF, PDGFRA, POLD1, and POLE (sequencing only); EPCAM and GREM1 (deletion/duplication only). DNA and RNA analyses performed for * genes.    10/17/2022 Imaging   CTA Chest and CT abdomen and pelvis with contrast  IMPRESSION: 1. Negative for acute PE or thoracic aortic  dissection. 2. No acute findings. 3. Colonic diverticulosis. 4. Right nephrolithiasis without hydronephrosis.   10/25/2022 - 10/25/2022 Chemotherapy   Patient is on Treatment Plan : BREAST MAINTENANCE Trastuzumab  IV (6) or SQ (600) D1 q21d X 11 Cycles     10/25/2022 -  Chemotherapy   Patient is on Treatment Plan : BREAST MAINTENANCE Trastuzumab  IV (6) or SQ (600) D1 q21d x 13 cycles        Discussed the use of AI scribe software for clinical note transcription with the patient, who gave verbal consent to proceed.  History of Present Illness [name] [name] is a 53 year old female with metastatic breast cancer who presents for follow-up. She is accompanied by her daughter, [place].  Diagnosed with metastatic breast cancer in 2015, she has been on Herceptin  since completing chemotherapy in 2016. A CT scan is scheduled for January 27, 2024, for cancer monitoring. Current medications include Emla  cream and gabapentin .  A recent breast infection has healed completely. She experienced flu-like symptoms with a persistent cough, treated with antibiotics and steroids, which have resolved.  She reports no current pain, changes in appetite, or energy levels. No ongoing cough or fatigue.     All other systems were reviewed with the patient and are negative.  MEDICAL HISTORY:  Past Medical History:  Diagnosis Date   Breast cancer (HCC)    Breast cancer (HCC)    CHF (congestive heart failure) (HCC)    Family history of breast cancer    Family history of prostate cancer    GERD (gastroesophageal reflux disease)    during pregnancy    Pneumonia    hx of pneumonia 08/2013    S/P radiation therapy 05/03/2015 through 06/14/2015                                                      Right breast 4680 cGy in 26 sessions, right breast boost 1000 cGy in 5 sessions.  Right supraclavicular/axillary region 4680 cGy with a supplemental PA field to bring the axillary dose up to 4500 cGy in 26 sessions     SURGICAL HISTORY: Past Surgical History:  Procedure Laterality Date   BREAST LUMPECTOMY WITH RADIOACTIVE SEED AND SENTINEL LYMPH NODE BIOPSY Right 03/29/2015   Procedure: BREAST LUMPECTOMY WITH RADIOACTIVE SEED AND SENTINEL LYMPH NODE BIOPSY;  Surgeon: Lockie Rima, MD;  Location: [facility];  Service: General;  Laterality: Right;   CESAREAN SECTION     CHOLECYSTECTOMY     ESSURE TUBAL LIGATION     PORTACATH PLACEMENT Left 07/21/2014   Procedure: INSERTION PORT-A-CATH;  Surgeon: Lockie Rima, MD;  Location: [facility];  Service: General;  Laterality: Left;   WISDOM TOOTH EXTRACTION      I have reviewed the social history and family history with the patient and they are unchanged from previous note.  ALLERGIES:  is allergic to adhesive [tape], aspirin, and codeine.  MEDICATIONS:  Current Outpatient Medications  Medication Sig Dispense Refill   acetaminophen  (TYLENOL ) 500 MG tablet Take 500 mg by mouth every 6 (six) hours as needed.     Ascorbic Acid (VITAMIN C) 500 MG CHEW      benzonatate  (TESSALON ) 200 MG capsule Take 1 capsule (200 mg total) by mouth 3 (three) times daily as needed for cough. 20 capsule 0   Calcium-Phosphorus-Vitamin D (CALCIUM GUMMIES PO) Take by mouth daily.     Camphor-Eucalyptus-Menthol (VICKS VAPORUB EX) Apply 1 application. topically at bedtime. Applies under nose, throat and on chest as needed     celecoxib  (CELEBREX ) 100 MG capsule Take 1 capsule (100 mg total) by mouth 2 (two) times daily. 60 capsule 0   cetirizine (ZYRTEC) 10 MG tablet Take 10 mg by mouth daily as needed for allergies (allergies).      fluconazole  (DIFLUCAN ) 150 MG tablet Take 1 tablet po once for yeast infection. May repeat dose in 3 days for persistent symptoms 3 tablet 1   fluticasone (FLONASE) 50 MCG/ACT nasal spray Place into both nostrils daily.     gabapentin  (NEURONTIN ) 100 MG capsule TAKE 2 CAPSULES(200 MG) BY MOUTH AT BEDTIME 60 capsule 1   hydrocortisone  2.5 % cream  Apply 1 application topically 2 (two) times daily as needed.     ibuprofen  (ADVIL ,MOTRIN ) 800 MG tablet Take 1 tablet (800 mg total) by mouth every 8 (eight) hours as needed. 60 tablet 1   lidocaine -prilocaine  (EMLA ) cream APPLY TO THE AFFECTED AREA PRIOR TO CHEMOTHERAPY AS DIRECTED 30 g 2   losartan  (COZAAR ) 25 MG tablet Take 1 tablet (25 mg total) by mouth daily. 30 tablet 0   meloxicam  (MOBIC ) 7.5 MG tablet  Take 7.5 mg by mouth daily.     Multiple Vitamins-Minerals (MULTIVITAMIN GUMMIES ADULT PO) Take 1 each by mouth daily. Women's Vitafusion gummie     spironolactone  (ALDACTONE ) 25 MG tablet TAKE 1 TABLET(25 MG) BY MOUTH DAILY 30 tablet 0   chlorpheniramine-HYDROcodone  (TUSSIONEX) 10-8 MG/5ML Take 5 mLs by mouth every 12 (twelve) hours as needed for cough. 115 mL 0   doxycycline  (VIBRAMYCIN ) 100 MG capsule Take 1 capsule (100 mg total) by mouth 2 (two) times daily. 10 capsule 0   methylPREDNISolone  (MEDROL  DOSEPAK) 4 MG TBPK tablet Take as directed on package. 21 tablet 0   No current facility-administered medications for this visit.   Facility-Administered Medications Ordered in Other Visits  Medication Dose Route Frequency Provider Last Rate Last Admin   sodium chloride  0.9 % injection 10 mL  10 mL Intravenous PRN Sonja [place], MD       sodium chloride  flush (NS) 0.9 % injection 10 mL  10 mL Intravenous PRN Sonja [place], MD        PHYSICAL EXAMINATION: ECOG PERFORMANCE STATUS: 0 - Asymptomatic  Vitals:   12/23/23 1350  BP: 136/64  Pulse: 87  Resp: 20  Temp: (!) 97.4 F (36.3 C)  SpO2: 99%   Wt Readings from Last 3 Encounters:  12/23/23 244 lb 6.4 oz (110.9 kg)  11/25/23 237 lb 4 oz (107.6 kg)  10/28/23 237 lb 12.8 oz (107.9 kg)     GENERAL:alert, no distress and comfortable SKIN: skin color, texture, turgor are normal, no rashes or significant lesions EYES: normal, Conjunctiva are pink and non-injected, sclera clear Musculoskeletal:no cyanosis of digits and no clubbing   NEURO: alert & oriented x 3 with fluent speech, no focal motor/sensory deficits  Physical Exam    LABORATORY DATA:  I have reviewed the data as listed    Latest Ref Rng & Units 12/23/2023    1:30 PM 10/16/2023    1:46 PM 09/24/2023    8:50 AM  CBC  WBC 4.0 - 10.5 K/uL 6.2  9.0  7.2   Hemoglobin 12.0 - 15.0 g/dL 11.9  14.7  82.9   Hematocrit 36.0 - 46.0 % 34.8  39.9  39.1   Platelets 150 - 400 K/uL 248  255  241         Latest Ref Rng & Units 12/23/2023    1:30 PM 10/16/2023    1:46 PM 09/24/2023    8:50 AM  CMP  Glucose 70 - 99 mg/dL 562  130  865   BUN 6 - 20 mg/dL 12  17  21    Creatinine 0.44 - 1.00 mg/dL 7.84  6.96  2.95   Sodium 135 - 145 mmol/L 142  136  136   Potassium 3.5 - 5.1 mmol/L 3.8  4.4  3.6   Chloride 98 - 111 mmol/L 106  103  101   CO2 22 - 32 mmol/L 30  22  28    Calcium 8.9 - 10.3 mg/dL 9.1  9.4  9.5   Total Protein 6.5 - 8.1 g/dL 7.3   8.5   Total Bilirubin 0.0 - 1.2 mg/dL 0.3   0.4   Alkaline Phos 38 - 126 U/L 72   78   AST 15 - 41 U/L 13   34   ALT 0 - 44 U/L 25   37       RADIOGRAPHIC STUDIES: I have personally reviewed the radiological images as listed and agreed with the findings in the report. No results  found.    No orders of the defined types were placed in this encounter.  All questions were answered. The patient knows to call the clinic with any problems, questions or concerns. No barriers to learning was detected. The total time spent in the appointment was 30 minutes, including review of chart and various tests results, discussions about plan of care and coordination of care plan     Sonja Burnside, MD 12/23/2023

## 2023-12-23 NOTE — Patient Instructions (Signed)
 [facility] - A DEPT OF [facility]  Discharge Instructions: Thank you for choosing [facility] to provide your oncology and hematology care.   If you have a lab appointment with the Cancer Center, please go directly to the Cancer Center and check in at the registration area.   Wear comfortable clothing and clothing appropriate for easy access to any Portacath or PICC line.   We strive to give you quality time with your provider. You may need to reschedule your appointment if you arrive late (15 or more minutes).  Arriving late affects you and other patients whose appointments are after yours.  Also, if you miss three or more appointments without notifying the office, you may be dismissed from the clinic at the provider's discretion.      For prescription refill requests, have your pharmacy contact our office and allow 72 hours for refills to be completed.    Today you received the following chemotherapy and/or immunotherapy agents kanjinti      To help prevent nausea and vomiting after your treatment, we encourage you to take your nausea medication as directed.  BELOW ARE SYMPTOMS THAT SHOULD BE REPORTED IMMEDIATELY: *FEVER GREATER THAN 100.4 F (38 C) OR HIGHER *CHILLS OR SWEATING *NAUSEA AND VOMITING THAT IS NOT CONTROLLED WITH YOUR NAUSEA MEDICATION *UNUSUAL SHORTNESS OF BREATH *UNUSUAL BRUISING OR BLEEDING *URINARY PROBLEMS (pain or burning when urinating, or frequent urination) *BOWEL PROBLEMS (unusual diarrhea, constipation, pain near the anus) TENDERNESS IN MOUTH AND THROAT WITH OR WITHOUT PRESENCE OF ULCERS (sore throat, sores in mouth, or a toothache) UNUSUAL RASH, SWELLING OR PAIN  UNUSUAL VAGINAL DISCHARGE OR ITCHING   Items with * indicate a potential emergency and should be followed up as soon as possible or go to the Emergency Department if any problems should occur.  Please show the CHEMOTHERAPY ALERT CARD or IMMUNOTHERAPY  ALERT CARD at check-in to the Emergency Department and triage nurse.  Should you have questions after your visit or need to cancel or reschedule your appointment, please contact [facility] - A DEPT OF [facility]  Dept: [phone]  and follow the prompts.  Office hours are 8:00 a.m. to 4:30 p.m. Monday - Friday. Please note that voicemails left after 4:00 p.m. may not be returned until the following business day.  We are closed weekends and major holidays. You have access to a nurse at all times for urgent questions. Please call the main number to the clinic Dept: [phone] and follow the prompts.   For any non-urgent questions, you may also contact your provider using MyChart. We now offer e-Visits for anyone 81 and older to request care online for non-urgent symptoms. For details visit [email or web address].   Also download the MyChart app! Go to the app store, search "MyChart", open the app, select [facility], and log in with your MyChart username and password.

## 2023-12-24 ENCOUNTER — Other Ambulatory Visit (HOSPITAL_COMMUNITY): Payer: Self-pay | Admitting: Cardiology

## 2023-12-24 DIAGNOSIS — I10 Essential (primary) hypertension: Secondary | ICD-10-CM

## 2023-12-25 ENCOUNTER — Other Ambulatory Visit: Payer: Self-pay

## 2023-12-27 ENCOUNTER — Other Ambulatory Visit: Payer: Self-pay

## 2023-12-27 DIAGNOSIS — C50911 Malignant neoplasm of unspecified site of right female breast: Secondary | ICD-10-CM

## 2023-12-27 NOTE — Progress Notes (Signed)
 Verbal order with readback from Dr. Maryalice Smaller for Signatera Q@6months  with 1st blood draw to be done on 01/20/2024.  Order placed and requisition completed online.  Kit along with paperwork given to [facility] lab receptionist.

## 2024-01-13 ENCOUNTER — Other Ambulatory Visit: Payer: Self-pay

## 2024-01-20 ENCOUNTER — Inpatient Hospital Stay: Payer: BC Managed Care – PPO | Attending: Hematology

## 2024-01-20 ENCOUNTER — Other Ambulatory Visit: Payer: Self-pay

## 2024-01-20 ENCOUNTER — Inpatient Hospital Stay

## 2024-01-20 VITALS — BP 124/78 | HR 72 | Temp 98.9°F | Resp 16 | Wt 240.2 lb

## 2024-01-20 DIAGNOSIS — Z95828 Presence of other vascular implants and grafts: Secondary | ICD-10-CM

## 2024-01-20 DIAGNOSIS — C50911 Malignant neoplasm of unspecified site of right female breast: Secondary | ICD-10-CM | POA: Insufficient documentation

## 2024-01-20 DIAGNOSIS — Z5112 Encounter for antineoplastic immunotherapy: Secondary | ICD-10-CM | POA: Diagnosis not present

## 2024-01-20 DIAGNOSIS — C7800 Secondary malignant neoplasm of unspecified lung: Secondary | ICD-10-CM | POA: Diagnosis not present

## 2024-01-20 LAB — CBC WITH DIFFERENTIAL (CANCER CENTER ONLY)
Abs Immature Granulocytes: 0.02 10*3/uL (ref 0.00–0.07)
Basophils Absolute: 0.1 10*3/uL (ref 0.0–0.1)
Basophils Relative: 1 %
Eosinophils Absolute: 0.2 10*3/uL (ref 0.0–0.5)
Eosinophils Relative: 2 %
HCT: 35 % — ABNORMAL LOW (ref 36.0–46.0)
Hemoglobin: 11.8 g/dL — ABNORMAL LOW (ref 12.0–15.0)
Immature Granulocytes: 0 %
Lymphocytes Relative: 33 %
Lymphs Abs: 2.6 10*3/uL (ref 0.7–4.0)
MCH: 30.2 pg (ref 26.0–34.0)
MCHC: 33.7 g/dL (ref 30.0–36.0)
MCV: 89.5 fL (ref 80.0–100.0)
Monocytes Absolute: 0.7 10*3/uL (ref 0.1–1.0)
Monocytes Relative: 9 %
Neutro Abs: 4.3 10*3/uL (ref 1.7–7.7)
Neutrophils Relative %: 55 %
Platelet Count: 258 10*3/uL (ref 150–400)
RBC: 3.91 MIL/uL (ref 3.87–5.11)
RDW: 13.8 % (ref 11.5–15.5)
WBC Count: 7.9 10*3/uL (ref 4.0–10.5)
nRBC: 0 % (ref 0.0–0.2)

## 2024-01-20 LAB — CMP (CANCER CENTER ONLY)
ALT: 19 U/L (ref 0–44)
AST: 15 U/L (ref 15–41)
Albumin: 4 g/dL (ref 3.5–5.0)
Alkaline Phosphatase: 69 U/L (ref 38–126)
Anion gap: 6 (ref 5–15)
BUN: 13 mg/dL (ref 6–20)
CO2: 29 mmol/L (ref 22–32)
Calcium: 9.3 mg/dL (ref 8.9–10.3)
Chloride: 103 mmol/L (ref 98–111)
Creatinine: 0.81 mg/dL (ref 0.44–1.00)
GFR, Estimated: >60 mL/min (ref >60)
Glucose, Bld: 87 mg/dL (ref 70–99)
Potassium: 3.8 mmol/L (ref 3.5–5.1)
Sodium: 138 mmol/L (ref 135–145)
Total Bilirubin: 0.3 mg/dL (ref 0.0–1.2)
Total Protein: 7.4 g/dL (ref 6.5–8.1)

## 2024-01-20 LAB — GENETIC SCREENING ORDER

## 2024-01-20 MED ORDER — TRASTUZUMAB-ANNS CHEMO 150 MG IV SOLR
6.0000 mg/kg | Freq: Once | INTRAVENOUS | Status: AC
  Administered 2024-01-20: 600 mg via INTRAVENOUS
  Filled 2024-01-20: qty 28.57

## 2024-01-20 MED ORDER — SODIUM CHLORIDE 0.9% FLUSH
10.0000 mL | Freq: Once | INTRAVENOUS | Status: AC
  Administered 2024-01-20: 10 mL

## 2024-01-20 MED ORDER — HEPARIN SOD (PORK) LOCK FLUSH 100 UNIT/ML IV SOLN
500.0000 [IU] | Freq: Once | INTRAVENOUS | Status: AC | PRN
  Administered 2024-01-20: 500 [IU]

## 2024-01-20 MED ORDER — SODIUM CHLORIDE 0.9 % IV SOLN: Freq: Once | INTRAVENOUS | Status: AC

## 2024-01-20 MED ORDER — SODIUM CHLORIDE 0.9% FLUSH
10.0000 mL | INTRAVENOUS | Status: DC | PRN
Start: 2024-01-20 — End: 2024-01-20
  Administered 2024-01-20: 10 mL

## 2024-01-20 NOTE — Patient Instructions (Signed)
 [facility] - A DEPT OF [facility]  Discharge Instructions: Thank you for choosing [facility] to provide your oncology and hematology care.   If you have a lab appointment with the Cancer Center, please go directly to the Cancer Center and check in at the registration area.   Wear comfortable clothing and clothing appropriate for easy access to any Portacath or PICC line.   We strive to give you quality time with your provider. You may need to reschedule your appointment if you arrive late (15 or more minutes).  Arriving late affects you and other patients whose appointments are after yours.  Also, if you miss three or more appointments without notifying the office, you may be dismissed from the clinic at the provider's discretion.      For prescription refill requests, have your pharmacy contact our office and allow 72 hours for refills to be completed.    Today you received the following chemotherapy and/or immunotherapy agent: Trastuzumab -Anns (Kanjinti )    To help prevent nausea and vomiting after your treatment, we encourage you to take your nausea medication as directed.  BELOW ARE SYMPTOMS THAT SHOULD BE REPORTED IMMEDIATELY: *FEVER GREATER THAN 100.4 F (38 C) OR HIGHER *CHILLS OR SWEATING *NAUSEA AND VOMITING THAT IS NOT CONTROLLED WITH YOUR NAUSEA MEDICATION *UNUSUAL SHORTNESS OF BREATH *UNUSUAL BRUISING OR BLEEDING *URINARY PROBLEMS (pain or burning when urinating, or frequent urination) *BOWEL PROBLEMS (unusual diarrhea, constipation, pain near the anus) TENDERNESS IN MOUTH AND THROAT WITH OR WITHOUT PRESENCE OF ULCERS (sore throat, sores in mouth, or a toothache) UNUSUAL RASH, SWELLING OR PAIN  UNUSUAL VAGINAL DISCHARGE OR ITCHING   Items with * indicate a potential emergency and should be followed up as soon as possible or go to the Emergency Department if any problems should occur.  Please show the CHEMOTHERAPY ALERT CARD or  IMMUNOTHERAPY ALERT CARD at check-in to the Emergency Department and triage nurse.  Should you have questions after your visit or need to cancel or reschedule your appointment, please contact [facility] - A DEPT OF [facility]  Dept: [phone]  and follow the prompts.  Office hours are 8:00 a.m. to 4:30 p.m. Monday - Friday. Please note that voicemails left after 4:00 p.m. may not be returned until the following business day.  We are closed weekends and major holidays. You have access to a nurse at all times for urgent questions. Please call the main number to the clinic Dept: [phone] and follow the prompts.   For any non-urgent questions, you may also contact your provider using MyChart. We now offer e-Visits for anyone 52 and older to request care online for non-urgent symptoms. For details visit [email or web address].   Also download the MyChart app! Go to the app store, search "MyChart", open the app, select [facility], and log in with your MyChart username and password.

## 2024-01-27 ENCOUNTER — Ambulatory Visit (HOSPITAL_COMMUNITY)
Admission: RE | Admit: 2024-01-27 | Discharge: 2024-01-27 | Disposition: A | Discharge status: Home / Self Care | Source: Ambulatory Visit | Attending: Nurse Practitioner | Admitting: Nurse Practitioner

## 2024-01-27 DIAGNOSIS — C50911 Malignant neoplasm of unspecified site of right female breast: Secondary | ICD-10-CM | POA: Diagnosis not present

## 2024-01-27 DIAGNOSIS — N200 Calculus of kidney: Secondary | ICD-10-CM | POA: Diagnosis not present

## 2024-01-27 DIAGNOSIS — C7800 Secondary malignant neoplasm of unspecified lung: Secondary | ICD-10-CM | POA: Diagnosis not present

## 2024-01-27 DIAGNOSIS — K760 Fatty (change of) liver, not elsewhere classified: Secondary | ICD-10-CM | POA: Diagnosis not present

## 2024-01-27 DIAGNOSIS — C7981 Secondary malignant neoplasm of breast: Secondary | ICD-10-CM | POA: Diagnosis not present

## 2024-01-27 MED ORDER — HEPARIN SOD (PORK) LOCK FLUSH 100 UNIT/ML IV SOLN
500.0000 [IU] | Freq: Once | INTRAVENOUS | Status: AC
  Administered 2024-01-27: 500 [IU] via INTRAVENOUS

## 2024-01-27 MED ORDER — SODIUM CHLORIDE (PF) 0.9 % IJ SOLN
INTRAMUSCULAR | Status: AC
  Filled 2024-01-27: qty 50

## 2024-01-27 MED ORDER — IOHEXOL 300 MG/ML  SOLN
100.0000 mL | Freq: Once | INTRAMUSCULAR | Status: AC | PRN
  Administered 2024-01-27: 100 mL via INTRAVENOUS

## 2024-01-28 ENCOUNTER — Other Ambulatory Visit: Payer: Self-pay | Admitting: Internal Medicine

## 2024-01-28 DIAGNOSIS — I10 Essential (primary) hypertension: Secondary | ICD-10-CM

## 2024-01-29 ENCOUNTER — Ambulatory Visit (INDEPENDENT_AMBULATORY_CARE_PROVIDER_SITE_OTHER): Admitting: Sports Medicine

## 2024-01-29 ENCOUNTER — Encounter: Payer: Self-pay | Admitting: Sports Medicine

## 2024-01-29 ENCOUNTER — Ambulatory Visit (HOSPITAL_BASED_OUTPATIENT_CLINIC_OR_DEPARTMENT_OTHER)
Admission: RE | Admit: 2024-01-29 | Discharge: 2024-01-29 | Disposition: A | Discharge status: Home / Self Care | Source: Ambulatory Visit | Attending: Sports Medicine | Admitting: Sports Medicine

## 2024-01-29 VITALS — BP 152/84 | Ht 70.0 in | Wt 240.0 lb

## 2024-01-29 DIAGNOSIS — M542 Cervicalgia: Secondary | ICD-10-CM | POA: Diagnosis not present

## 2024-01-29 DIAGNOSIS — S161XXA Strain of muscle, fascia and tendon at neck level, initial encounter: Secondary | ICD-10-CM | POA: Diagnosis not present

## 2024-01-29 MED ORDER — PREDNISONE 10 MG PO TABS
ORAL_TABLET | ORAL | 0 refills | Status: DC
Start: 2024-01-29 — End: 2024-03-26

## 2024-01-29 MED ORDER — METAXALONE 800 MG PO TABS: 800.0000 mg | ORAL_TABLET | Freq: Three times a day (TID) | ORAL | 0 refills | Status: DC | PRN

## 2024-01-29 NOTE — Progress Notes (Signed)
   Subjective:    Patient ID: [name] [name], female    DOB: 01/21/1971, 53 y.o.   MRN: [id number]  HPI chief complaint: Neck pain  [name] [name] is a very pleasant 53 year old female that presents today with acute neck pain that began last night.  She had a little bit of pain in her neck prior to bed but during the night her pain worsened.  She woke up with her neck spasming.  She has great difficulty moving her neck.  She took a Celebrex  earlier this morning and feels like that is starting to help some.  She denies any similar problems in the past.  She denies radiating pain into her arms.  No numbness or tingling.  Past medical history reviewed Medications reviewed Allergies reviewed  Review of Systems As above    Objective:   Physical Exam  Well-developed, well-nourished.  No acute distress.  Cervical spine: There is diffuse spasm of the paraspinal musculature.  Limited range of motion in all planes.  No midline tenderness.  Reflexes are trace but equal at the biceps, triceps, and brachioradialis tendons bilaterally.  Strength is 5/5 in both upper extremities.  X-rays of the cervical spine including AP and lateral view show no significant degenerative changes.  There is loss of the normal cervical lordosis secondary to spasm.    Assessment & Plan:   Cervical strain/spasm  Morgana will continue on her Celebrex  twice daily.  I will prescribe Skelaxin  800 mg 3 times daily as needed for her spasm.  I will also prescribe a 6-day Sterapred Dosepak which she will start if the Celebrex  is ineffective.  I have recommended physical therapy.  She would like to schedule this here in our [facility] office.  I recommended moist heat.  If symptoms do not improve, then consider referral to PM&R.  Follow-up as needed.  This note was dictated using Dragon naturally speaking software and may contain errors in syntax, spelling, or content which have not been identified prior to signing this note.

## 2024-01-30 ENCOUNTER — Other Ambulatory Visit: Payer: Self-pay

## 2024-02-01 LAB — SIGNATERA
SIGNATERA MTM READOUT: 0 MTM/ml
SIGNATERA TEST RESULT: NEGATIVE

## 2024-02-06 ENCOUNTER — Other Ambulatory Visit (HOSPITAL_COMMUNITY): Payer: Self-pay

## 2024-02-06 ENCOUNTER — Telehealth: Payer: Self-pay

## 2024-02-06 ENCOUNTER — Ambulatory Visit: Payer: Self-pay | Admitting: Nurse Practitioner

## 2024-02-06 NOTE — Telephone Encounter (Signed)
 Contacted patient to let known provider reviewed mychart message. NP stated You are correct. Guardant Reveal is still pending, if that is also negative/ not detected, we can likely stop treatment, per Dr. Demetra recommendation. Understood, no further questions needed.

## 2024-02-16 NOTE — Assessment & Plan Note (Addendum)
 T3N1M1, stage IV, ER-/PR-/HER2+, [id number], NED -diagnosed 06/2014 with metastatic disease, s/p neoadjuvant chemo, right lumpectomy, adjuvant radiation and currently on Maintenance Herceptin  since 04/12/15. Her restaging scan has showed NED -10/17/2022 - CTA chest - negative for PE or thoracic aortic dissection. No acute findings. There is subpleural scarring of anterior right upper lobe. No new nodules or new disease noted.  -10/17/2022 - CT abdomen and pelvis - colonic diverticulosis. Right renal stone without hydronephrosis.  -new restaging CT CAP due 10/2023.  -She has been on maintenance Herceptin  for about 7.5 years.  She is probably cured, although I am not able to approve it. Will continue Herceptin  for now, I would consider stopping therapy after 10 years -she continues to tolerate herceptin  infusions well.  -03/20/2023 - patient complains of moderate to severe fatigue. She is unsure if this fatigue is similar to fatigue she has previously experienced related to chemotherapy treatments, or if this fatigue is worse. It has not been intrusive yet, but she feels like it will become intrusive soon. Increased concern because her aunt, also a breast cancer survivor, was diagnosed with liver cancer at the same time the patient was diagnosed with breast cancer, in 2015. The patient states that her aunt's only complaint was fatigue.  -Labs reviewed, overall stable. CBC normal. Glucose 162 with remainder of CMP normal.  -continue labs/flush and herceptin  infusions every 28 days.  -F/u in 4 months  -reviewed CTA chest and CT abdomen and pelvis with the patient, done 10/2022. There was no evidence of metastatic or new disease noted.  -repeated CT CAP in 03/2023 was negative for recurrence, echo in 04/2023 showed normal EF   -repeated CT in 01/2024 showed NED. ctDNA Signatera in 01/2024 was also negative, she is likely cured.  -will stop her treatment and monitor closely

## 2024-02-17 ENCOUNTER — Encounter: Payer: Self-pay | Admitting: Hematology

## 2024-02-17 ENCOUNTER — Ambulatory Visit: Admitting: Physical Therapy

## 2024-02-17 ENCOUNTER — Inpatient Hospital Stay

## 2024-02-17 ENCOUNTER — Inpatient Hospital Stay (HOSPITAL_BASED_OUTPATIENT_CLINIC_OR_DEPARTMENT_OTHER): Admitting: Hematology

## 2024-02-17 ENCOUNTER — Inpatient Hospital Stay: Attending: Hematology

## 2024-02-17 ENCOUNTER — Other Ambulatory Visit: Payer: Self-pay

## 2024-02-17 VITALS — BP 121/73 | HR 95 | Temp 97.8°F | Resp 17 | Ht 70.0 in | Wt 238.0 lb

## 2024-02-17 DIAGNOSIS — Z171 Estrogen receptor negative status [ER-]: Secondary | ICD-10-CM | POA: Insufficient documentation

## 2024-02-17 DIAGNOSIS — C7800 Secondary malignant neoplasm of unspecified lung: Secondary | ICD-10-CM | POA: Diagnosis not present

## 2024-02-17 DIAGNOSIS — C50911 Malignant neoplasm of unspecified site of right female breast: Secondary | ICD-10-CM | POA: Diagnosis not present

## 2024-02-17 DIAGNOSIS — Z95828 Presence of other vascular implants and grafts: Secondary | ICD-10-CM

## 2024-02-17 LAB — CBC WITH DIFFERENTIAL (CANCER CENTER ONLY)
Abs Immature Granulocytes: 0.02 K/uL (ref 0.00–0.07)
Basophils Absolute: 0 K/uL (ref 0.0–0.1)
Basophils Relative: 0 %
Eosinophils Absolute: 0.2 K/uL (ref 0.0–0.5)
Eosinophils Relative: 2 %
HCT: 37.9 % (ref 36.0–46.0)
Hemoglobin: 12.7 g/dL (ref 12.0–15.0)
Immature Granulocytes: 0 %
Lymphocytes Relative: 25 %
Lymphs Abs: 2.2 K/uL (ref 0.7–4.0)
MCH: 29.9 pg (ref 26.0–34.0)
MCHC: 33.5 g/dL (ref 30.0–36.0)
MCV: 89.2 fL (ref 80.0–100.0)
Monocytes Absolute: 0.6 K/uL (ref 0.1–1.0)
Monocytes Relative: 6 %
Neutro Abs: 5.8 K/uL (ref 1.7–7.7)
Neutrophils Relative %: 67 %
Platelet Count: 257 K/uL (ref 150–400)
RBC: 4.25 MIL/uL (ref 3.87–5.11)
RDW: 13.2 % (ref 11.5–15.5)
WBC Count: 8.8 K/uL (ref 4.0–10.5)
nRBC: 0 % (ref 0.0–0.2)

## 2024-02-17 LAB — CMP (CANCER CENTER ONLY)
ALT: 25 U/L (ref 0–44)
AST: 21 U/L (ref 15–41)
Albumin: 4 g/dL (ref 3.5–5.0)
Alkaline Phosphatase: 64 U/L (ref 38–126)
Anion gap: 7 (ref 5–15)
BUN: 14 mg/dL (ref 6–20)
CO2: 27 mmol/L (ref 22–32)
Calcium: 9.2 mg/dL (ref 8.9–10.3)
Chloride: 105 mmol/L (ref 98–111)
Creatinine: 0.96 mg/dL (ref 0.44–1.00)
GFR, Estimated: >60 mL/min (ref >60)
Glucose, Bld: 141 mg/dL — ABNORMAL HIGH (ref 70–99)
Potassium: 3.6 mmol/L (ref 3.5–5.1)
Sodium: 139 mmol/L (ref 135–145)
Total Bilirubin: 0.6 mg/dL (ref 0.0–1.2)
Total Protein: 7.7 g/dL (ref 6.5–8.1)

## 2024-02-17 MED ORDER — SODIUM CHLORIDE 0.9% FLUSH
10.0000 mL | Freq: Once | INTRAVENOUS | Status: AC
Start: 2024-02-17 — End: 2024-02-17
  Administered 2024-02-17: 10 mL

## 2024-02-17 NOTE — Progress Notes (Signed)
 [facility]   [phone] [phone]   Clinic Follow up Note   Patient Care Team: Beverlie Crazier, [name] as PCP - General (Family Medicine) Adine Pellet, DO (Family Medicine) Aron Shoulders, MD as Consulting Physician (General Surgery) Lanny Callander, MD as Consulting Physician (Hematology) Alona Kate HERO, MD (Obstetrics and Gynecology) Cherrie, Toribio SAUNDERS, MD as Consulting Physician (Cardiology) Mammography, [facility] as Radiologist (Diagnostic Radiology)  Date of Service:  02/17/2024  CHIEF COMPLAINT: f/u of metastatic breast cancer  CURRENT THERAPY:  Trastuzumab  every 4 weeks, will stop today   Oncology History   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 Her restaging scan has showed NED -10/17/2022 - CTA chest - negative for PE or thoracic aortic dissection. No acute findings. There is subpleural scarring of anterior right upper lobe. No new nodules or new disease noted.  -10/17/2022 - CT abdomen and pelvis - colonic diverticulosis. Right renal stone without hydronephrosis.  -new restaging CT CAP due 10/2023.  -She has been on maintenance Herceptin  for about 7.5 years.  She is probably cured, although I am not able to approve it. Will continue Herceptin  for now, I would consider stopping therapy after 10 years -she continues to tolerate herceptin  infusions well.  -03/20/2023 - patient complains of moderate to severe fatigue. She is unsure if this fatigue is similar to fatigue she has previously experienced related to chemotherapy treatments, or if this fatigue is worse. It has not been intrusive yet, but she feels like it will become intrusive soon. Increased concern because her aunt, also a breast cancer survivor, was diagnosed with liver cancer at the same time the  patient was diagnosed with breast cancer, in 2015. The patient states that her aunt's only complaint was fatigue.  -Labs reviewed, overall stable. CBC normal. Glucose 162 with remainder of CMP normal.  -continue labs/flush and herceptin  infusions every 28 days.  -F/u in 4 months  -reviewed CTA chest and CT abdomen and pelvis with the patient, done 10/2022. There was no evidence of metastatic or new disease noted.  -repeated CT CAP in 03/2023 was negative for recurrence, echo in 04/2023 showed normal EF   -repeated CT in 01/2024 showed NED. ctDNA Signatera in 01/2024 was also negative, she is likely cured.  -will stop her treatment and monitor closely   Assessment & Plan Metastatic breast cancer, triple negative Metastatic breast cancer, initially diagnosed in 2015. Recent Signatera test results are negative, indicating no detectable circulating tumor DNA.  -I personally reviewed her CT scan from January 27, 2024 which showed no evidence of cancer recurrence. - I recommend discontinuing treatment and she agrees.  She will ring the bell to signify the end of treatment.  -Continued monitoring is important despite the negative test results, as recurrence is still possible. - Cancel current treatment session. - Order Signatera test every four months. - Schedule follow-up appointment in 8 months. - Continue monitoring with CT scans and blood tests as needed. - Flush port every six to eight weeks. - Consider removing port after one year if no further treatment is needed.  Plan - Lab and CT scan reviewed, no evidence of residual disease, will stop her trastuzumab  treatment. - Continue port flush every 8 weeks, Signatera every 16 weeks - Follow-up follow-up in 8 months with lab, flush and CT scan 1 week before    SUMMARY OF  ONCOLOGIC HISTORY: Oncology History Overview Note  Breast cancer metastasized to lung   Staging form: Breast, AJCC 7th Edition     Clinical stage from 07/22/2014: Stage IV (T3,  N1, M1) - Unsigned     Breast cancer metastasized to lung (HCC)  07/02/2014 Mammogram   Mammogram showed a 2cm right beast mass and a 1.8cm right axillary node. MRI breast on 07/16/2014 showed 7cm R breast lesion and 4.4cm r axillary node    07/02/2014 Imaging   CT CAP: a 4.7cm mass in LUL lung and a 2.1cm mas in RML, and a small nodule in RUL, suspecious for metastasis     07/09/2014 Initial Diagnosis   right IDA with b/l lung lesions, ER-/PR-/HER2+   07/09/2014 Initial Biopsy   US  guided right breast mass and axillary node biopsy showed IDA, and DCIS, ER-/PR-/HER2+   07/26/2014 Pathologic Stage   Left lung mass by IR, path revealed high grade carcinoma, morphology similar to breast tumor biopsy, TTF(-), NapsinA(-), ER(-)   08/04/2014 - 03/22/2015 Chemotherapy   weekly Paclitaxel  80mg /m2, trastuzumab  and pertuzumab  every 3 weeks   08/30/2014 Genetic Testing   BreastNext panel was negative. 17 genes including BRCA1, BRCA2, were negative for mutations.    10/04/2014 Imaging   Interval decrease in the right axillary lymphadenopathy. Bilateral pulmonary lesions with left hilar lymphadenopathy also markedly decreased in the interval. The left hilar lymphadenopathy has resolved.   12/20/2014 Imaging   restaging CT showed stable disease, no new lesions    03/29/2015 Pathology Results    right breast lumpectomy showed  chemotherapy treatment effect,  a 1 mm residual tumor,   margins were widely negative, 5 sentinel lymph nodes and 2 axillary lymph nodes were negative.    03/29/2015 Surgery    right breast lumpectomy and sentinel lymph node biopsy  by Dr. Aron   04/12/2015 -  Chemotherapy   Herceptin  maintenance therapy , 6 mg/kg, every 3 weeks since 04/12/15. Switched to Herceptin  Hylecta injection q3weeks after 06/10/19.   04/12/2015 - 10/04/2022 Chemotherapy   Patient is on Treatment Plan : BREAST Trastuzumab  q21d     05/03/2015 - 06/14/2015 Radiation Therapy   right breast adjuvant  irradiation by Dr. Jason    08/28/2016 Imaging   CT CAP w Contrast  IMPRESSION: 1. No acute process or evidence of metastatic disease within the chest, abdomen, or pelvis. 2. Similar to less well-defined left upper lobe density, likely scarring. No evidence of new or progressive pulmonary metastasis. 3. Right nephrolithiasis.   01/22/2017 Imaging   CT Chest W Contrast 01/22/17 IMPRESSION: 1. Stable exam. No new or progressive findings. No evidence for metastatic disease. 2. Left upper lobe architectural distortion/scarring is stable. 3. Nonobstructing right renal stone.   07/31/2017 Imaging   Bone Scan Whole Body 07/31/17  IMPRESSION: 1. No scintigraphic evidence of osseous metastatic disease. 2. Thoracolumbar scoliosis.    07/31/2017 Imaging   CT CAP W Contrast 07/31/17 IMPRESSION: 1. No findings of active or recurrent malignancy. 2. Other imaging findings of potential clinical significance: Aortic Atherosclerosis (ICD10-I70.0). Mild cardiomegaly. Postoperative and radiation therapy findings in the right chest. Nonobstructive right nephrolithiasis. Thoracolumbar scoliosis.     01/20/2018 Echocardiogram   ECHO 01/20/2018 Study Conclusions   - Left ventricle: The cavity size was normal. Wall thickness was   normal. Systolic function was normal. The estimated ejection   fraction was in the range of 55% to 60%. Wall motion was normal;   there were no regional wall motion abnormalities. Doppler  parameters are consistent with abnormal left ventricular   relaxation (grade 1 diastolic dysfunction). - Impressions: GLS -20.7% LS&' 10.4 cm.    02/17/2018 Imaging   02/17/2018 CT CAP IMPRESSION: 1. No evidence for residual or recurrent tumor or metastatic disease. 2.  Aortic Atherosclerosis (ICD10-I70.0). 3. Right renal calculi. 4. Scoliosis.   08/11/2018 Imaging   CT CAP W Contrast 08/11/18  IMPRESSION: No evidence for localized recurrence or metastatic disease in  the chest, abdomen or pelvis.   02/23/2019 Imaging   CT CAP W Contrast  IMPRESSION: 1. No findings of active malignancy in the chest, abdomen, or pelvis. 2. 3 mm right kidney lower pole nonobstructive renal calculus. 3. Thoracolumbar scoliosis.   12/08/2019 Imaging   CT CAP W contrast  IMPRESSION: 1. Unchanged post treatment appearance of the lungs. No evidence of recurrent or new metastatic disease in the chest, abdomen, or pelvis.   2. Postoperative findings of right lumpectomy and axillary lymph node dissection.   3.  Nonobstructive right nephrolithiasis.   11/17/2020 Imaging   CT CAP IMPRESSION: 1. No evidence of recurrence or new metastatic disease within the chest, abdomen, or pelvis. 2. Similar post treatment changes in the lungs and postoperative findings of right lumpectomy and axillary lymph node dissection. 3. Colonic diverticulosis without findings of acute diverticulitis.   08/10/2021 Genetic Testing   Negative genetic testing on the CancerNext-Expanded+RNAinsight panel.  The report date is August 10, 2021.  The CancerNext-Expanded gene panel offered by [facility] and includes sequencing and rearrangement analysis for the following 77 genes: AIP, ALK, APC*, ATM*, AXIN2, BAP1, BARD1, BLM, BMPR1A, BRCA1*, BRCA2*, BRIP1*, CDC73, CDH1*, CDK4, CDKN1B, CDKN2A, CHEK2*, CTNNA1, DICER1, FANCC, FH, FLCN, GALNT12, KIF1B, LZTR1, MAX, MEN1, MET, MLH1*, MSH2*, MSH3, MSH6*, MUTYH*, NBN, NF1*, NF2, NTHL1, PALB2*, PHOX2B, PMS2*, POT1, PRKAR1A, PTCH1, PTEN*, RAD51C*, RAD51D*, RB1, RECQL, RET, SDHA, SDHAF2, SDHB, SDHC, SDHD, SMAD4, SMARCA4, SMARCB1, SMARCE1, STK11, SUFU, TMEM127, TP53*, TSC1, TSC2, VHL and XRCC2 (sequencing and deletion/duplication); EGFR, EGLN1, HOXB13, KIT, MITF, PDGFRA, POLD1, and POLE (sequencing only); EPCAM and GREM1 (deletion/duplication only). DNA and RNA analyses performed for * genes.    10/17/2022 Imaging   CTA Chest and CT abdomen and pelvis with contrast   IMPRESSION: 1. Negative for acute PE or thoracic aortic dissection. 2. No acute findings. 3. Colonic diverticulosis. 4. Right nephrolithiasis without hydronephrosis.   10/25/2022 - 10/25/2022 Chemotherapy   Patient is on Treatment Plan : BREAST MAINTENANCE Trastuzumab  IV (6) or SQ (600) D1 q21d X 11 Cycles     10/25/2022 -  Chemotherapy   Patient is on Treatment Plan : BREAST MAINTENANCE Trastuzumab  IV (6) or SQ (600) D1 q21d x 13 cycles        Discussed the use of AI scribe software for clinical note transcription with the patient, who gave verbal consent to proceed.  History of Present Illness [name] [name] is a 53 year old female with metastatic breast cancer who presents for follow-up. She is accompanied by her daughter, [place].  She is awaiting the results of the Guardian Reveal test, while the Signatera test was negative for circulating tumor DNA. She continues to undergo treatment for metastatic breast cancer. Her breast cancer diagnosis dates back to 2015, and she has been receiving regular treatments and tests since then. Her port, in place for nearly ten years, remains asymptomatic.     All other systems were reviewed with the patient and are negative.  MEDICAL HISTORY:  Past Medical History:  Diagnosis Date   Breast  cancer [facility])    Breast cancer (HCC)    CHF (congestive heart failure) (HCC)    Family history of breast cancer    Family history of prostate cancer    GERD (gastroesophageal reflux disease)    during pregnancy    Pneumonia    hx of pneumonia 08/2013    S/P radiation therapy 05/03/2015 through 06/14/2015                                                      Right breast 4680 cGy in 26 sessions, right breast boost 1000 cGy in 5 sessions.  Right supraclavicular/axillary region 4680 cGy with a supplemental PA field to bring the axillary dose up to 4500 cGy in 26 sessions    SURGICAL HISTORY: Past Surgical History:  Procedure Laterality Date   BREAST  LUMPECTOMY WITH RADIOACTIVE SEED AND SENTINEL LYMPH NODE BIOPSY Right 03/29/2015   Procedure: BREAST LUMPECTOMY WITH RADIOACTIVE SEED AND SENTINEL LYMPH NODE BIOPSY;  Surgeon: Jina Nephew, MD;  Location: [facility];  Service: General;  Laterality: Right;   CESAREAN SECTION     CHOLECYSTECTOMY     ESSURE TUBAL LIGATION     PORTACATH PLACEMENT Left 07/21/2014   Procedure: INSERTION PORT-A-CATH;  Surgeon: Jina Nephew, MD;  Location: [facility];  Service: General;  Laterality: Left;   WISDOM TOOTH EXTRACTION      I have reviewed the social history and family history with the patient and they are unchanged from previous note.  ALLERGIES:  is allergic to adhesive [tape], aspirin, and codeine.  MEDICATIONS:  Current Outpatient Medications  Medication Sig Dispense Refill   acetaminophen  (TYLENOL ) 500 MG tablet Take 500 mg by mouth every 6 (six) hours as needed.     Ascorbic Acid (VITAMIN C) 500 MG CHEW      benzonatate  (TESSALON ) 200 MG capsule Take 1 capsule (200 mg total) by mouth 3 (three) times daily as needed for cough. 20 capsule 0   Calcium-Phosphorus-Vitamin D (CALCIUM GUMMIES PO) Take by mouth daily.     Camphor-Eucalyptus-Menthol (VICKS VAPORUB EX) Apply 1 application. topically at bedtime. Applies under nose, throat and on chest as needed     celecoxib  (CELEBREX ) 100 MG capsule Take 1 capsule (100 mg total) by mouth 2 (two) times daily. 60 capsule 0   cetirizine (ZYRTEC) 10 MG tablet Take 10 mg by mouth daily as needed for allergies (allergies).      chlorpheniramine-HYDROcodone  (TUSSIONEX) 10-8 MG/5ML Take 5 mLs by mouth every 12 (twelve) hours as needed for cough. 115 mL 0   doxycycline  (VIBRAMYCIN ) 100 MG capsule Take 1 capsule (100 mg total) by mouth 2 (two) times daily. 10 capsule 0   fluconazole  (DIFLUCAN ) 150 MG tablet Take 1 tablet po once for yeast infection. May repeat dose in 3 days for persistent symptoms 3 tablet 1   fluticasone (FLONASE) 50 MCG/ACT nasal spray  Place into both nostrils daily.     gabapentin  (NEURONTIN ) 100 MG capsule TAKE 2 CAPSULES(200 MG) BY MOUTH AT BEDTIME 60 capsule 1   hydrocortisone  2.5 % cream Apply 1 application topically 2 (two) times daily as needed.     ibuprofen  (ADVIL ,MOTRIN ) 800 MG tablet Take 1 tablet (800 mg total) by mouth every 8 (eight) hours as needed. 60 tablet 1   lidocaine -prilocaine  (EMLA ) cream APPLY TO THE AFFECTED  AREA PRIOR TO CHEMOTHERAPY AS DIRECTED 30 g 2   losartan  (COZAAR ) 25 MG tablet TAKE 1 TABLET(25 MG) BY MOUTH DAILY 30 tablet 0   meloxicam  (MOBIC ) 7.5 MG tablet Take 7.5 mg by mouth daily.     metaxalone  (SKELAXIN ) 800 MG tablet Take 1 tablet (800 mg total) by mouth 3 (three) times daily as needed for muscle spasms. 60 tablet 0   methylPREDNISolone  (MEDROL  DOSEPAK) 4 MG TBPK tablet Take as directed on package. 21 tablet 0   Multiple Vitamins-Minerals (MULTIVITAMIN GUMMIES ADULT PO) Take 1 each by mouth daily. Women's Vitafusion gummie     predniSONE  (DELTASONE ) 10 MG tablet Take by mouth as directed. 21 tablet 0   spironolactone  (ALDACTONE ) 25 MG tablet TAKE 1 TABLET(25 MG) BY MOUTH DAILY 30 tablet 0   No current facility-administered medications for this visit.   Facility-Administered Medications Ordered in Other Visits  Medication Dose Route Frequency Provider Last Rate Last Admin   sodium chloride  0.9 % injection 10 mL  10 mL Intravenous PRN Lanny Callander, MD       sodium chloride  flush (NS) 0.9 % injection 10 mL  10 mL Intravenous PRN Lanny Callander, MD        PHYSICAL EXAMINATION: ECOG PERFORMANCE STATUS: 0 - Asymptomatic  Vitals:   02/17/24 1414  BP: 121/73  Pulse: 95  Resp: 17  Temp: 97.8 F (36.6 C)  SpO2: 100%   Wt Readings from Last 3 Encounters:  02/17/24 238 lb (108 kg)  01/29/24 240 lb (108.9 kg)  01/20/24 240 lb 4 oz (109 kg)     GENERAL:alert, no distress and comfortable SKIN: skin color, texture, turgor are normal, no rashes or significant lesions EYES: normal,  Conjunctiva are pink and non-injected, sclera clear NECK: supple, thyroid  normal size, non-tender, without nodularity LYMPH:  no palpable lymphadenopathy in the cervical, axillary  LUNGS: clear to auscultation and percussion with normal breathing effort HEART: regular rate & rhythm and no murmurs and no lower extremity edema ABDOMEN:abdomen soft, non-tender and normal bowel sounds Musculoskeletal:no cyanosis of digits and no clubbing  NEURO: alert & oriented x 3 with fluent speech, no focal motor/sensory deficits  Physical Exam    LABORATORY DATA:  I have reviewed the data as listed    Latest Ref Rng & Units 02/17/2024    1:53 PM 01/20/2024    1:33 PM 12/23/2023    1:30 PM  CBC  WBC 4.0 - 10.5 K/uL 8.8  7.9  6.2   Hemoglobin 12.0 - 15.0 g/dL 87.2  88.1  88.3   Hematocrit 36.0 - 46.0 % 37.9  35.0  34.8   Platelets 150 - 400 K/uL 257  258  248         Latest Ref Rng & Units 02/17/2024    1:53 PM 01/20/2024    1:33 PM 12/23/2023    1:30 PM  CMP  Glucose 70 - 99 mg/dL 858  87  869   BUN 6 - 20 mg/dL 14  13  12    Creatinine 0.44 - 1.00 mg/dL 9.03  9.18  9.15   Sodium 135 - 145 mmol/L 139  138  142   Potassium 3.5 - 5.1 mmol/L 3.6  3.8  3.8   Chloride 98 - 111 mmol/L 105  103  106   CO2 22 - 32 mmol/L 27  29  30    Calcium 8.9 - 10.3 mg/dL 9.2  9.3  9.1   Total Protein 6.5 - 8.1 g/dL 7.7  7.4  7.3   Total Bilirubin 0.0 - 1.2 mg/dL 0.6  0.3  0.3   Alkaline Phos 38 - 126 U/L 64  69  72   AST 15 - 41 U/L 21  15  13    ALT 0 - 44 U/L 25  19  25        RADIOGRAPHIC STUDIES: I have personally reviewed the radiological images as listed and agreed with the findings in the report. No results found.    Orders Placed This Encounter  Procedures   CT CHEST ABDOMEN PELVIS W CONTRAST    Standing Status:   Future    Expected Date:   09/21/2024    Expiration Date:   02/16/2025    If indicated for the ordered procedure, I authorize the administration of contrast media per Radiology protocol:    Yes    Does the patient have a contrast media/X-ray dye allergy?:   No    Preferred imaging location?:   [facility]    If indicated for the ordered procedure, I authorize the administration of oral contrast media per Radiology protocol:   Yes   All questions were answered. The patient knows to call the clinic with any problems, questions or concerns. No barriers to learning was detected. The total time spent in the appointment was 25 minutes, including review of chart and various tests results, discussions about plan of care and coordination of care plan     Onita Mattock, MD 02/17/2024

## 2024-02-29 ENCOUNTER — Other Ambulatory Visit: Payer: Self-pay | Admitting: Internal Medicine

## 2024-02-29 ENCOUNTER — Other Ambulatory Visit: Payer: Self-pay

## 2024-02-29 DIAGNOSIS — I10 Essential (primary) hypertension: Secondary | ICD-10-CM

## 2024-03-16 DIAGNOSIS — N939 Abnormal uterine and vaginal bleeding, unspecified: Secondary | ICD-10-CM | POA: Insufficient documentation

## 2024-03-16 DIAGNOSIS — R935 Abnormal findings on diagnostic imaging of other abdominal regions, including retroperitoneum: Secondary | ICD-10-CM | POA: Insufficient documentation

## 2024-03-16 DIAGNOSIS — R9389 Abnormal findings on diagnostic imaging of other specified body structures: Secondary | ICD-10-CM | POA: Diagnosis not present

## 2024-03-20 ENCOUNTER — Other Ambulatory Visit: Payer: Self-pay

## 2024-03-26 ENCOUNTER — Ambulatory Visit (INDEPENDENT_AMBULATORY_CARE_PROVIDER_SITE_OTHER): Admitting: Family Medicine

## 2024-03-26 ENCOUNTER — Encounter: Payer: Self-pay | Admitting: Hematology

## 2024-03-26 ENCOUNTER — Encounter: Payer: Self-pay | Admitting: Family Medicine

## 2024-03-26 VITALS — BP 135/78 | HR 79 | Temp 98.7°F | Resp 18 | Ht 70.0 in | Wt 238.8 lb

## 2024-03-26 DIAGNOSIS — R1084 Generalized abdominal pain: Secondary | ICD-10-CM

## 2024-03-26 DIAGNOSIS — G620 Drug-induced polyneuropathy: Secondary | ICD-10-CM

## 2024-03-26 DIAGNOSIS — C50911 Malignant neoplasm of unspecified site of right female breast: Secondary | ICD-10-CM

## 2024-03-26 DIAGNOSIS — T451X5A Adverse effect of antineoplastic and immunosuppressive drugs, initial encounter: Secondary | ICD-10-CM

## 2024-03-26 DIAGNOSIS — I10 Essential (primary) hypertension: Secondary | ICD-10-CM | POA: Diagnosis not present

## 2024-03-26 DIAGNOSIS — C7800 Secondary malignant neoplasm of unspecified lung: Secondary | ICD-10-CM

## 2024-03-26 DIAGNOSIS — Z7689 Persons encountering health services in other specified circumstances: Secondary | ICD-10-CM

## 2024-03-26 MED ORDER — LOSARTAN POTASSIUM 25 MG PO TABS: 25.0000 mg | ORAL_TABLET | Freq: Every day | ORAL | 1 refills | Status: AC

## 2024-03-26 MED ORDER — SPIRONOLACTONE 25 MG PO TABS: 25.0000 mg | ORAL_TABLET | Freq: Every day | ORAL | 1 refills | Status: AC

## 2024-03-26 NOTE — Progress Notes (Signed)
 New Patient Office Visit  Subjective    Patient ID: [name] [name], female    DOB: 03-11-1971  Age: 53 y.o. MRN: [id number]  CC:  Chief Complaint  Patient presents with   Establish Care    Patient is here to establish care with a new PCP, Patient states that she is having lower abdominal cramping feels like period Cramps . Pain scale patient states that it was a dull (8)    HPI [name] [name] presents to establish care. Pt is new to me.  Pt is having D and C tomorrow due to fibroids and polyps. She had another D and C back in 2020.  She is a breast cancer survivor. She was diagnosed with right breast cancer and was on Herceptin up until April 2025. She reports she hasn't had a cycle in a year up until June 13 th with LMP being in May 2024. This period was lighter flow but came with stomach cramps.   Pt reports last Friday, she has the same cramping in her stomach but worsening Monday and Tuesday. Sometimes she feels constipated but hasn't tried anything for it. Wonders if it's from her fibroids/polyps or something else.   She has Losartan 25mg  and Aldactone 25mg  daily for HTN. She needs this refilled.   Pt taking gabapentin for neuropathy from chemo therapy.    Outpatient Encounter Medications as of 03/26/2024  Medication Sig   acetaminophen (TYLENOL) 500 MG tablet Take 500 mg by mouth every 6 (six) hours as needed.   Ascorbic Acid (VITAMIN C) 500 MG CHEW    Calcium-Phosphorus-Vitamin D (CALCIUM GUMMIES PO) Take by mouth daily.   Camphor-Eucalyptus-Menthol (VICKS VAPORUB EX) Apply 1 application. topically at bedtime. Applies under nose, throat and on chest as needed   celecoxib (CELEBREX) 100 MG capsule Take 1 capsule (100 mg total) by mouth 2 (two) times daily.   cetirizine (ZYRTEC) 10 MG tablet Take 10 mg by mouth daily as needed for allergies (allergies).    fluticasone (FLONASE) 50 MCG/ACT nasal spray Place into both nostrils daily.   gabapentin (NEURONTIN) 100 MG  capsule TAKE 2 CAPSULES(200 MG) BY MOUTH AT BEDTIME   hydrocortisone 2.5 % cream Apply 1 application topically 2 (two) times daily as needed.   ibuprofen (ADVIL,MOTRIN) 800 MG tablet Take 1 tablet (800 mg total) by mouth every 8 (eight) hours as needed.   lidocaine-prilocaine (EMLA) cream APPLY TO THE AFFECTED AREA PRIOR TO CHEMOTHERAPY AS DIRECTED   meloxicam (MOBIC) 7.5 MG tablet Take 7.5 mg by mouth daily.   metaxalone (SKELAXIN) 800 MG tablet Take 1 tablet (800 mg total) by mouth 3 (three) times daily as needed for muscle spasms.   Multiple Vitamins-Minerals (MULTIVITAMIN GUMMIES ADULT PO) Take 1 each by mouth daily. Women's Vitafusion gummie   [DISCONTINUED] fluconazole (DIFLUCAN) 150 MG tablet Take 1 tablet po once for yeast infection. May repeat dose in 3 days for persistent symptoms   [DISCONTINUED] losartan (COZAAR) 25 MG tablet TAKE 1 TABLET(25 MG) BY MOUTH DAILY   [DISCONTINUED] spironolactone (ALDACTONE) 25 MG tablet TAKE 1 TABLET(25 MG) BY MOUTH DAILY   losartan (COZAAR) 25 MG tablet Take 1 tablet (25 mg total) by mouth daily.   spironolactone (ALDACTONE) 25 MG tablet Take 1 tablet (25 mg total) by mouth daily.   [DISCONTINUED] benzonatate (TESSALON) 200 MG capsule Take 1 capsule (200 mg total) by mouth 3 (three) times daily as needed for cough.   [DISCONTINUED] chlorpheniramine-HYDROcodone (TUSSIONEX) 10-8 MG/5ML Take 5 mLs by mouth  every 12 (twelve) hours as needed for cough.   [DISCONTINUED] doxycycline (VIBRAMYCIN) 100 MG capsule Take 1 capsule (100 mg total) by mouth 2 (two) times daily.   [DISCONTINUED] methylPREDNISolone (MEDROL DOSEPAK) 4 MG TBPK tablet Take as directed on package.   [DISCONTINUED] predniSONE (DELTASONE) 10 MG tablet Take by mouth as directed.   Facility-Administered Encounter Medications as of 03/26/2024  Medication   sodium chloride 0.9 % injection 10 mL   sodium chloride flush (NS) 0.9 % injection 10 mL    Past Medical History:  Diagnosis Date    Allergy    Dust mites, ragweed, grass   Breast cancer (HCC)    Breast cancer (HCC)    CHF (congestive heart failure) (HCC)    Family history of breast cancer    Family history of prostate cancer    GERD (gastroesophageal reflux disease)    during pregnancy    Hypertension    Pneumonia    hx of pneumonia 08/2013    S/P radiation therapy 05/03/2015 through 06/14/2015                                                      Right breast 4680 cGy in 26 sessions, right breast boost 1000 cGy in 5 sessions.  Right supraclavicular/axillary region 4680 cGy with a supplemental PA field to bring the axillary dose up to 4500 cGy in 26 sessions    Past Surgical History:  Procedure Laterality Date   BREAST LUMPECTOMY WITH RADIOACTIVE SEED AND SENTINEL LYMPH NODE BIOPSY Right 03/29/2015   Procedure: BREAST LUMPECTOMY WITH RADIOACTIVE SEED AND SENTINEL LYMPH NODE BIOPSY;  Surgeon: Jina Nephew, MD;  Location: [facility];  Service: General;  Laterality: Right;   BREAST SURGERY     CESAREAN SECTION     CHOLECYSTECTOMY     ESSURE TUBAL LIGATION     PORTACATH PLACEMENT Left 07/21/2014   Procedure: INSERTION PORT-A-CATH;  Surgeon: Jina Nephew, MD;  Location: [facility];  Service: General;  Laterality: Left;   WISDOM TOOTH EXTRACTION      Family History  Problem Relation Age of Onset   Breast cancer Mother 8   Cancer Mother    Hypertension Mother    Varicose Veins Mother    Alcohol abuse Father    Breast cancer Maternal Aunt 40   Cancer Maternal Aunt    Breast cancer Maternal Aunt 68   Cancer Maternal Aunt    Prostate cancer Maternal Uncle 73   Prostate cancer Maternal Uncle 68   Prostate cancer Maternal Grandfather 76   Cancer Maternal Grandfather    Diabetes Maternal Grandfather    Liver cancer Paternal Grandmother    Cancer Paternal Grandmother    Prostate cancer Paternal Grandfather    Cancer Paternal Grandfather    Breast cancer Cousin 54       mat first cousin    Social  History   Socioeconomic History   Marital status: Married    Spouse name: Not on file   Number of children: 2   Years of education: Not on file   Highest education level: Some college, no degree  Occupational History   Not on file  Tobacco Use   Smoking status: Never    Passive exposure: Never   Smokeless tobacco: Never  Vaping Use   Vaping status: Never Used  Substance and Sexual Activity   Alcohol use: Yes    Alcohol/week: 3.0 standard drinks of alcohol    Types: 3 Standard drinks or equivalent per week    Comment: weekly   Drug use: No   Sexual activity: Yes    Birth control/protection: Surgical  Other Topics Concern   Not on file  Social History Narrative   Not on file   Social Drivers of Health   Financial Resource Strain: Low Risk  (03/25/2024)   Overall Financial Resource Strain (CARDIA)    Difficulty of Paying Living Expenses: Not very hard  Food Insecurity: No Food Insecurity (03/25/2024)   Hunger Vital Sign    Worried About Running Out of Food in the Last Year: Never true    Ran Out of Food in the Last Year: Never true  Transportation Needs: No Transportation Needs (03/25/2024)   PRAPARE - Administrator, Civil Service (Medical): No    Lack of Transportation (Non-Medical): No  Physical Activity: Insufficiently Active (03/25/2024)   Exercise Vital Sign    Days of Exercise per Week: 3 days    Minutes of Exercise per Session: 40 min  Stress: No Stress Concern Present (03/25/2024)   [employer] of Occupational Health - Occupational Stress Questionnaire    Feeling of Stress: Only a little  Social Connections: Socially Integrated (03/25/2024)   Social Connection and Isolation Panel    Frequency of Communication with Friends and Family: More than three times a week    Frequency of Social Gatherings with Friends and Family: Twice a week    Attends Religious Services: More than 4 times per year    Active Member of [employer] or Organizations: Yes     Attends Banker Meetings: More than 4 times per year    Marital Status: Married  Catering manager Violence: Not At Risk (09/09/2021)   Received from [facility] visits prior to 10/13/2022.   Humiliation, Afraid, Rape, and Kick questionnaire    Within the last year, have you been afraid of your partner or ex-partner?: No    Within the last year, have you been humiliated or emotionally abused in other ways by your partner or ex-partner?: No    Within the last year, have you been kicked, hit, slapped, or otherwise physically hurt by your partner or ex-partner?: No    Within the last year, have you been raped or forced to have any kind of sexual activity by your partner or ex-partner?: No    Review of Systems  Gastrointestinal:  Positive for abdominal pain and constipation.  All other systems reviewed and are negative.       Objective    BP 135/78   Pulse 79   Temp 98.7 F (37.1 C) (Oral)   Resp 18   Ht 5' 10 (1.778 m)   Wt 238 lb 12.8 oz (108.3 kg)   LMP 01/19/2024 (Within Days)   SpO2 98%   BMI 34.26 kg/m   Physical Exam Vitals and nursing note reviewed.  Constitutional:      Appearance: Normal appearance. She is obese.  HENT:     Head: Normocephalic and atraumatic.     Right Ear: External ear normal.     Left Ear: External ear normal.     Nose: Nose normal.     Mouth/Throat:     Mouth: Mucous membranes are moist.     Pharynx: Oropharynx is clear.  Eyes:     Conjunctiva/sclera: Conjunctivae  normal.     Pupils: Pupils are equal, round, and reactive to light.  Cardiovascular:     Rate and Rhythm: Normal rate and regular rhythm.     Pulses: Normal pulses.     Heart sounds: Normal heart sounds.  Pulmonary:     Effort: Pulmonary effort is normal.     Breath sounds: Normal breath sounds.  Abdominal:     General: Abdomen is flat. Bowel sounds are normal.  Skin:    General: Skin is warm.     Capillary Refill: Capillary refill takes  less than 2 seconds.  Neurological:     General: No focal deficit present.     Mental Status: She is alert and oriented to person, place, and time. Mental status is at baseline.  Psychiatric:        Mood and Affect: Mood normal.        Behavior: Behavior normal.        Thought Content: Thought content normal.        Judgment: Judgment normal.         Assessment & Plan:   Problem List Items Addressed This Visit       Cardiovascular and Mediastinum   Benign essential hypertension   Relevant Medications   spironolactone  (ALDACTONE ) 25 MG tablet   losartan  (COZAAR ) 25 MG tablet     Nervous and Auditory   Peripheral neuropathy due to chemotherapy [facility])   Other Visit Diagnoses       Encounter to establish care with new doctor    -  Primary     Generalized abdominal pain         Breast cancer metastasized to lung, right (HCC)          Encounter to establish care with new doctor  Generalized abdominal pain  Benign essential hypertension -     Spironolactone ; Take 1 tablet (25 mg total) by mouth daily.  Dispense: 90 tablet; Refill: 1 -     Losartan  Potassium; Take 1 tablet (25 mg total) by mouth daily.  Dispense: 90 tablet; Refill: 1  Breast cancer metastasized to lung, right (HCC)  Peripheral neuropathy due to chemotherapy (HCC)   Pt with abdominal pain. Could be related to fibroids but also constipation. Advised pt on abd xray to look along with starting Miralax. She would like to wait until after her procedure for now.  To refill HTN medicines as noted above Continue routine follow up with oncology. Return for Annual Physical.   Torrence CINDERELLA Barrier, MD

## 2024-03-27 ENCOUNTER — Other Ambulatory Visit: Payer: Self-pay

## 2024-03-27 DIAGNOSIS — N841 Polyp of cervix uteri: Secondary | ICD-10-CM | POA: Diagnosis not present

## 2024-03-27 DIAGNOSIS — R935 Abnormal findings on diagnostic imaging of other abdominal regions, including retroperitoneum: Secondary | ICD-10-CM | POA: Diagnosis not present

## 2024-03-27 DIAGNOSIS — N840 Polyp of corpus uteri: Secondary | ICD-10-CM | POA: Diagnosis not present

## 2024-03-27 DIAGNOSIS — N939 Abnormal uterine and vaginal bleeding, unspecified: Secondary | ICD-10-CM | POA: Diagnosis not present

## 2024-03-30 ENCOUNTER — Other Ambulatory Visit: Payer: Self-pay | Admitting: Internal Medicine

## 2024-03-30 DIAGNOSIS — I10 Essential (primary) hypertension: Secondary | ICD-10-CM

## 2024-04-06 DIAGNOSIS — N939 Abnormal uterine and vaginal bleeding, unspecified: Secondary | ICD-10-CM | POA: Diagnosis not present

## 2024-04-06 DIAGNOSIS — N840 Polyp of corpus uteri: Secondary | ICD-10-CM | POA: Diagnosis not present

## 2024-04-06 DIAGNOSIS — Z853 Personal history of malignant neoplasm of breast: Secondary | ICD-10-CM | POA: Diagnosis not present

## 2024-04-06 DIAGNOSIS — Z09 Encounter for follow-up examination after completed treatment for conditions other than malignant neoplasm: Secondary | ICD-10-CM | POA: Diagnosis not present

## 2024-04-07 ENCOUNTER — Encounter: Payer: Self-pay | Admitting: Family Medicine

## 2024-04-07 ENCOUNTER — Ambulatory Visit (INDEPENDENT_AMBULATORY_CARE_PROVIDER_SITE_OTHER): Admitting: Family Medicine

## 2024-04-07 VITALS — BP 124/76 | HR 80 | Temp 98.6°F | Resp 18 | Ht 70.0 in | Wt 239.9 lb

## 2024-04-07 DIAGNOSIS — Z136 Encounter for screening for cardiovascular disorders: Secondary | ICD-10-CM | POA: Diagnosis not present

## 2024-04-07 DIAGNOSIS — Z0000 Encounter for general adult medical examination without abnormal findings: Secondary | ICD-10-CM

## 2024-04-07 DIAGNOSIS — D229 Melanocytic nevi, unspecified: Secondary | ICD-10-CM

## 2024-04-07 DIAGNOSIS — Z13220 Encounter for screening for lipoid disorders: Secondary | ICD-10-CM

## 2024-04-07 DIAGNOSIS — R7302 Impaired glucose tolerance (oral): Secondary | ICD-10-CM | POA: Diagnosis not present

## 2024-04-07 NOTE — Progress Notes (Signed)
   Established Patient Office Visit  Subjective   Patient ID: [name] [name], female    DOB: 17-Aug-1970  Age: 53 y.o. MRN: [id number]  Chief Complaint  Patient presents with   Annual Exam    HPI  Mole Pt says she had a new mole 2 years ago on her right groin. It's grown and is more raised. She continues to bump up against it. Would like this evaluated.    Review of Systems  Skin:        Changing mole  All other systems reviewed and are negative.     Objective:     BP 124/76   Pulse 80   Temp 98.6 F (37 C) (Oral)   Resp 18   Ht 5' 10 (1.778 m)   Wt 239 lb 14.4 oz (108.8 kg)   LMP 01/19/2024 (Within Days)   SpO2 99%   BMI 34.42 kg/m  BP Readings from Last 3 Encounters:  04/07/24 124/76  03/26/24 135/78  02/17/24 121/73      Physical Exam Vitals and nursing note reviewed.  Constitutional:      Appearance: Normal appearance. She is normal weight.  HENT:     Head: Normocephalic and atraumatic.     Right Ear: External ear normal.     Left Ear: External ear normal.     Nose: Nose normal.     Mouth/Throat:     Mouth: Mucous membranes are moist.     Pharynx: Oropharynx is clear.  Eyes:     Conjunctiva/sclera: Conjunctivae normal.     Pupils: Pupils are equal, round, and reactive to light.  Cardiovascular:     Rate and Rhythm: Normal rate.  Pulmonary:     Effort: Pulmonary effort is normal.  Skin:    General: Skin is warm.     Capillary Refill: Capillary refill takes less than 2 seconds.     Comments: Raised mole to right upper thigh  Neurological:     General: No focal deficit present.     Mental Status: She is alert and oriented to person, place, and time. Mental status is at baseline.  Psychiatric:        Mood and Affect: Mood normal.        Behavior: Behavior normal.        Thought Content: Thought content normal.        Judgment: Judgment normal.      No results found for any visits on 04/07/24.     The 10-year ASCVD risk score  (Arnett DK, et al., 2019) is: 5.3%    Assessment & Plan:   Problem List Items Addressed This Visit   None    Atypical mole       Relevant Orders   Ambulatory referral to Dermatology      Pt with mole which has become more raised and enlarged over the years. To refer to dermatology for evaluation and biopsy.  Return in about 6 months (around 10/08/2024) for Chronic condition follow up.    Torrence CINDERELLA Barrier, MD

## 2024-04-07 NOTE — Progress Notes (Signed)
 Complete physical exam  Patient: [name] [name]   DOB: [birth year]   53 y.o. Female  MRN: [id number]  Subjective:    Chief Complaint  Patient presents with   Annual Exam    [name] [name] is a 53 y.o. female who presents today for a complete physical exam. She reports consuming a general diet. Walking 3 days a week She generally feels well. She reports sleeping well. She does have additional problems to discuss today. See separate encounter  Pt would like PCV-20. Most recent fall risk assessment:    03/26/2024    1:39 PM  Fall Risk   Falls in the past year? 0  Number falls in past yr: 0  Injury with Fall? 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   Seborrheic dermatitis of scalp 06/29/2021   Prediabetes 12/27/2020   Benign essential hypertension 11/12/2019   Dysmenorrhea 12/12/2018   Port-A-Cath in place 08/21/2018   Vitamin D deficiency 07/15/2018   Candidiasis of breast 04/05/2016   Eczema 04/05/2016   Port-A-Cath in place 12/06/2015   Seasonal allergies 10/04/2015   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   Peripheral neuropathy due to chemotherapy (HCC) 03/15/2015   Breast cancer metastasized to lung (HCC) 07/22/2014   Family history of breast cancer 07/22/2014   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  Right supraclavicular/axillary region 4680 cGy with a supplemental PA field to bring the axillary dose up to 4500 cGy in 26 sessions   Social History   Tobacco Use   Smoking status: Never    Passive exposure: Never   Smokeless tobacco: Never  Vaping Use   Vaping status: Never Used  Substance Use Topics   Alcohol use: Yes    Alcohol/week: 3.0 standard drinks of alcohol    Types: 3 Standard drinks or equivalent per week    Comment: weekly   Drug use: No   Family Status  Relation Name Status  Mother Altamese Grieves Alive   Father Ardell Chancy Deceased   Mat Aunt [facility] [place] Alive   Mat Aunt Randie [facility] Deceased   Mat Uncle  Alive   Mat Uncle  Alive   [place]  Deceased   MGF Zachary Numbers Deceased   PGM Susie Chancy Deceased   PGF Dempsey Chancy Deceased   Cousin  Alive  No partnership data on file   Allergies  Allergen Reactions   Adhesive [Tape] Rash and Other (See Comments)    tape burn   Aspirin Nausea Only    Upsets stomach   Codeine Other (See Comments)    Makes loopy       Patient Care Team: [name], MD as PCP - General (Family Medicine) Aron Shoulders, MD as Consulting Physician (General Surgery) Lanny Callander, MD as Consulting Physician (Hematology) Bensimhon, Toribio SAUNDERS, MD as Consulting Physician (Cardiology) Mammography, [facility] as Radiologist (Diagnostic Radiology)   Outpatient Medications Prior to Visit  Medication Sig   acetaminophen  (TYLENOL ) 500 MG tablet Take 500 mg by mouth every 6 (six) hours as needed.    Ascorbic Acid (VITAMIN C) 500 MG CHEW    Calcium-Phosphorus-Vitamin D (CALCIUM GUMMIES PO) Take by mouth daily.   Camphor-Eucalyptus-Menthol (VICKS VAPORUB EX) Apply 1 application. topically at bedtime. Applies under nose, throat and on chest as needed   celecoxib  (CELEBREX ) 100 MG capsule Take 1 capsule (100 mg total) by mouth 2 (two) times daily.   cetirizine (ZYRTEC) 10 MG tablet Take 10 mg by mouth daily as needed for allergies (allergies).    fluticasone (FLONASE) 50 MCG/ACT nasal spray Place into both nostrils daily.   gabapentin  (NEURONTIN ) 100 MG capsule TAKE 2 CAPSULES(200 MG) BY MOUTH AT BEDTIME   hydrocortisone  2.5 % cream Apply 1 application topically 2 (two) times daily as needed.   ibuprofen  (ADVIL ,MOTRIN ) 800 MG tablet Take 1 tablet (800 mg total) by mouth every 8 (eight) hours as needed.   lidocaine -prilocaine  (EMLA ) cream APPLY TO THE AFFECTED AREA PRIOR TO CHEMOTHERAPY AS DIRECTED   losartan  (COZAAR ) 25 MG tablet Take 1 tablet (25 mg total) by mouth daily.   meloxicam  (MOBIC ) 7.5 MG tablet Take 7.5 mg by mouth daily.   metaxalone  (SKELAXIN ) 800 MG tablet Take 1 tablet (800 mg total) by mouth 3 (three) times daily as needed for muscle spasms.   Multiple Vitamins-Minerals (MULTIVITAMIN GUMMIES ADULT PO) Take 1 each by mouth daily. Women's Vitafusion gummie   spironolactone  (ALDACTONE ) 25 MG tablet Take 1 tablet (25 mg total) by mouth daily.   Facility-Administered Medications Prior to Visit  Medication Dose Route Frequency Provider   sodium chloride  0.9 % injection 10 mL  10 mL Intravenous PRN Lanny Callander, MD   sodium chloride  flush (NS) 0.9 % injection 10 mL  10 mL Intravenous PRN Lanny Callander, MD    Review of Systems  All other systems reviewed and are negative.      Objective:     BP 124/76   Pulse 80   Temp 98.6 F (37 C) (Oral)   Resp 18   Ht 5' 10 (1.778 m)   Wt 239 lb 14.4 oz (108.8 kg)   LMP 01/19/2024 (Within Days)   SpO2 99%   BMI 34.42 kg/m  BP Readings  from Last 3 Encounters:  04/07/24 124/76  03/26/24 135/78  02/17/24 121/73      Physical Exam Vitals and nursing note reviewed.  Constitutional:      Appearance: Normal appearance. She is normal weight.  HENT:     Head: Normocephalic and atraumatic.     Right Ear: Tympanic membrane, ear canal and external ear normal.     Left Ear: Tympanic membrane, ear canal and external ear normal.     Nose: Nose normal.     Mouth/Throat:     Mouth: Mucous membranes are moist.     Pharynx: Oropharynx is clear.  Eyes:     Conjunctiva/sclera: Conjunctivae normal.     Pupils: Pupils are equal, round, and reactive to light.  Cardiovascular:     Rate and Rhythm: Normal rate and regular rhythm.     Pulses: Normal pulses.     Heart sounds: Normal heart sounds.  Pulmonary:     Effort: Pulmonary effort is normal.     Breath sounds: Normal breath sounds.  Abdominal:     General: Abdomen is flat. Bowel sounds are normal.  Skin:    General: Skin is warm.     Capillary Refill: Capillary refill takes less than 2 seconds.  Neurological:     General: No focal deficit present.     Mental Status: She is alert and oriented to person, place, and time. Mental status is at baseline.  Psychiatric:        Mood and Affect: Mood normal.        Behavior: Behavior normal.        Thought Content: Thought content normal.        Judgment: Judgment normal.     No results found for any visits on 04/07/24. Last CBC Lab Results  Component Value Date   WBC 8.8 02/17/2024   HGB 12.7 02/17/2024   HCT 37.9 02/17/2024   MCV 89.2 02/17/2024   MCH 29.9 02/17/2024   RDW 13.2 02/17/2024   PLT 257 02/17/2024   Last metabolic panel Lab Results  Component Value Date   GLUCOSE 141 (H) 02/17/2024   NA 139 02/17/2024   K 3.6 02/17/2024   CL 105 02/17/2024   CO2 27 02/17/2024   BUN 14 02/17/2024   CREATININE 0.96 02/17/2024   GFRNONAA >60 02/17/2024   CALCIUM 9.2 02/17/2024   PROT 7.7 02/17/2024   ALBUMIN 4.0  02/17/2024   BILITOT 0.6 02/17/2024   ALKPHOS 64 02/17/2024   AST 21 02/17/2024   ALT 25 02/17/2024   ANIONGAP 7 02/17/2024   Last lipids No results found for: CHOL, HDL, LDLCALC, LDLDIRECT, TRIG, CHOLHDL Last hemoglobin A1c Lab Results  Component Value Date   HGBA1C 5.7 (H) 03/20/2023        Assessment & Plan:    Routine Health Maintenance and Physical Exam  Immunization History  Administered Date(s) Administered   Fluzone Influenza virus vaccine,trivalent (IIV3), split virus 07/04/2018   Influenza,inj,Quad PF,6+ Mos 05/24/2015, 05/22/2016, 07/16/2017, 07/30/2018, 06/10/2019, 06/01/2020, 06/14/2021   Influenza-Unspecified 05/24/2015, 05/22/2016, 07/16/2017, 07/04/2018, 06/01/2020   Moderna Covid-19 Fall Seasonal Vaccine 38yrs & older 06/05/2022   Moderna Sars-Covid-2 Vaccination 10/24/2019, 11/24/2019, 06/28/2020   Tdap 12/20/2022    Health Maintenance  Topic Date Due   HIV Screening  Never done   Hepatitis C Screening  Never done   Pneumococcal Vaccine: 50+ Years (1 of 2 - PCV) Never done   Hepatitis B Vaccines 19-59 Average Risk (1 of 3 - 19+ 3-dose series) Never done   Zoster Vaccines- Shingrix (1 of 2) Never done   COVID-19 Vaccine (5 - 2024-25 season) 04/14/2023   INFLUENZA VACCINE  03/13/2024   MAMMOGRAM  09/26/2025   Cervical Cancer Screening (HPV/Pap Cotest)  02/06/2026   Colonoscopy  02/23/2030   DTaP/Tdap/Td (2 - Td or Tdap) 12/19/2032   HPV VACCINES  Aged Out   Meningococcal B Vaccine  Aged Out    Discussed health benefits of physical activity, and encouraged her to engage in regular exercise appropriate for her age and condition.  Problem List Items Addressed This Visit   None  No follow-ups on file. Annual physical exam  Encounter for lipid screening for cardiovascular disease -     Lipid panel  Impaired glucose tolerance -     Hemoglobin A1c  Screening labs to include A1c and lipid panel. To return when fasting.  To defer PCV-20  next week. Pt isn't fasting so will wait to have labs done at that time.         Torrence CINDERELLA Barrier, MD

## 2024-04-08 ENCOUNTER — Other Ambulatory Visit: Payer: Self-pay

## 2024-04-10 ENCOUNTER — Inpatient Hospital Stay: Attending: Hematology

## 2024-04-15 ENCOUNTER — Ambulatory Visit (INDEPENDENT_AMBULATORY_CARE_PROVIDER_SITE_OTHER): Admitting: Family Medicine

## 2024-04-15 VITALS — Temp 99.0°F

## 2024-04-15 DIAGNOSIS — Z136 Encounter for screening for cardiovascular disorders: Secondary | ICD-10-CM | POA: Diagnosis not present

## 2024-04-15 DIAGNOSIS — Z23 Encounter for immunization: Secondary | ICD-10-CM

## 2024-04-15 DIAGNOSIS — Z13220 Encounter for screening for lipoid disorders: Secondary | ICD-10-CM | POA: Diagnosis not present

## 2024-04-15 DIAGNOSIS — R7302 Impaired glucose tolerance (oral): Secondary | ICD-10-CM | POA: Diagnosis not present

## 2024-04-15 NOTE — Progress Notes (Signed)
 Patient is here for a Prevnar 20 injection. Denies fever, problems or dizziness.   Patient tolerated injection well without complications on the Left deltoid. Patient is also here to have labs collected.  Patient advised to schedule next visit with provider as directed.

## 2024-04-16 ENCOUNTER — Ambulatory Visit: Payer: Self-pay | Admitting: Family Medicine

## 2024-04-16 LAB — LIPID PANEL
Chol/HDL Ratio: 4.3 ratio (ref 0.0–4.4)
Cholesterol, Total: 195 mg/dL (ref 100–199)
HDL: 45 mg/dL (ref >39)
LDL Chol Calc (NIH): 133 mg/dL — ABNORMAL HIGH (ref 0–99)
Triglycerides: 93 mg/dL (ref 0–149)
VLDL Cholesterol Cal: 17 mg/dL (ref 5–40)

## 2024-04-16 LAB — HEMOGLOBIN A1C
Est. average glucose Bld gHb Est-mCnc: 128 mg/dL
Hgb A1c MFr Bld: 6.1 % — ABNORMAL HIGH (ref 4.8–5.6)

## 2024-04-20 ENCOUNTER — Encounter: Payer: Self-pay | Admitting: Hematology

## 2024-05-20 DIAGNOSIS — Z01419 Encounter for gynecological examination (general) (routine) without abnormal findings: Secondary | ICD-10-CM | POA: Diagnosis not present

## 2024-05-20 DIAGNOSIS — Z853 Personal history of malignant neoplasm of breast: Secondary | ICD-10-CM | POA: Diagnosis not present

## 2024-05-20 DIAGNOSIS — Z803 Family history of malignant neoplasm of breast: Secondary | ICD-10-CM | POA: Diagnosis not present

## 2024-05-20 DIAGNOSIS — Z9889 Other specified postprocedural states: Secondary | ICD-10-CM | POA: Diagnosis not present

## 2024-05-29 ENCOUNTER — Encounter: Payer: Self-pay | Admitting: Hematology

## 2024-06-01 DIAGNOSIS — C50911 Malignant neoplasm of unspecified site of right female breast: Secondary | ICD-10-CM | POA: Diagnosis not present

## 2024-06-03 DIAGNOSIS — C50911 Malignant neoplasm of unspecified site of right female breast: Secondary | ICD-10-CM | POA: Diagnosis not present

## 2024-06-08 ENCOUNTER — Other Ambulatory Visit: Payer: Self-pay

## 2024-06-08 ENCOUNTER — Ambulatory Visit: Admitting: Hematology

## 2024-06-08 ENCOUNTER — Other Ambulatory Visit

## 2024-06-08 NOTE — Assessment & Plan Note (Signed)
 T3N1M1, stage IV, ER-/PR-/HER2+, ypT88miN0, NED -diagnosed 06/2014 with metastatic disease, s/p neoadjuvant chemo, right lumpectomy, adjuvant radiation and currently on Maintenance Herceptin  since 04/12/15. Her restaging scan has showed NED -10/17/2022 - CTA chest - negative for PE or thoracic aortic dissection. No acute findings. There is subpleural scarring of anterior right upper lobe. No new nodules or new disease noted.  -10/17/2022 - CT abdomen and pelvis - colonic diverticulosis. Right renal stone without hydronephrosis.  -new restaging CT CAP due 10/2023.  -She has been on maintenance Herceptin  for about 7.5 years.  She is probably cured, although I am not able to approve it. Will continue Herceptin  for now, I would consider stopping therapy after 10 years -she continues to tolerate herceptin  infusions well.  -03/20/2023 - patient complains of moderate to severe fatigue. She is unsure if this fatigue is similar to fatigue she has previously experienced related to chemotherapy treatments, or if this fatigue is worse. It has not been intrusive yet, but she feels like it will become intrusive soon. Increased concern because her aunt, also a breast cancer survivor, was diagnosed with liver cancer at the same time the patient was diagnosed with breast cancer, in 2015. The patient states that her aunt's only complaint was fatigue.  -Labs reviewed, overall stable. CBC normal. Glucose 162 with remainder of CMP normal.  -continue labs/flush and herceptin  infusions every 28 days.  -F/u in 4 months  -reviewed CTA chest and CT abdomen and pelvis with the patient, done 10/2022. There was no evidence of metastatic or new disease noted.  -repeated CT CAP in 03/2023 was negative for recurrence, echo in 04/2023 showed normal EF   -repeated CT in 01/2024 showed NED. ctDNA Signatera in 01/2024 was also negative, she is likely cured.  -will stop her treatment and monitor closely

## 2024-06-09 ENCOUNTER — Inpatient Hospital Stay

## 2024-06-09 ENCOUNTER — Inpatient Hospital Stay (HOSPITAL_BASED_OUTPATIENT_CLINIC_OR_DEPARTMENT_OTHER): Admitting: Hematology

## 2024-06-09 ENCOUNTER — Inpatient Hospital Stay: Attending: Hematology

## 2024-06-09 VITALS — BP 138/74 | HR 73 | Temp 97.2°F | Resp 17 | Ht 70.0 in | Wt 240.0 lb

## 2024-06-09 DIAGNOSIS — Z886 Allergy status to analgesic agent status: Secondary | ICD-10-CM | POA: Insufficient documentation

## 2024-06-09 DIAGNOSIS — Z9221 Personal history of antineoplastic chemotherapy: Secondary | ICD-10-CM | POA: Insufficient documentation

## 2024-06-09 DIAGNOSIS — I10 Essential (primary) hypertension: Secondary | ICD-10-CM | POA: Insufficient documentation

## 2024-06-09 DIAGNOSIS — Z9049 Acquired absence of other specified parts of digestive tract: Secondary | ICD-10-CM | POA: Insufficient documentation

## 2024-06-09 DIAGNOSIS — Z885 Allergy status to narcotic agent status: Secondary | ICD-10-CM | POA: Insufficient documentation

## 2024-06-09 DIAGNOSIS — Z79899 Other long term (current) drug therapy: Secondary | ICD-10-CM | POA: Insufficient documentation

## 2024-06-09 DIAGNOSIS — M79601 Pain in right arm: Secondary | ICD-10-CM | POA: Diagnosis not present

## 2024-06-09 DIAGNOSIS — R232 Flushing: Secondary | ICD-10-CM | POA: Diagnosis not present

## 2024-06-09 DIAGNOSIS — J984 Other disorders of lung: Secondary | ICD-10-CM | POA: Insufficient documentation

## 2024-06-09 DIAGNOSIS — I700 Atherosclerosis of aorta: Secondary | ICD-10-CM | POA: Insufficient documentation

## 2024-06-09 DIAGNOSIS — C7800 Secondary malignant neoplasm of unspecified lung: Secondary | ICD-10-CM

## 2024-06-09 DIAGNOSIS — Z853 Personal history of malignant neoplasm of breast: Secondary | ICD-10-CM | POA: Diagnosis not present

## 2024-06-09 DIAGNOSIS — Z803 Family history of malignant neoplasm of breast: Secondary | ICD-10-CM | POA: Diagnosis not present

## 2024-06-09 DIAGNOSIS — C50911 Malignant neoplasm of unspecified site of right female breast: Secondary | ICD-10-CM | POA: Diagnosis not present

## 2024-06-09 DIAGNOSIS — Z800 Family history of malignant neoplasm of digestive organs: Secondary | ICD-10-CM | POA: Insufficient documentation

## 2024-06-09 DIAGNOSIS — Z923 Personal history of irradiation: Secondary | ICD-10-CM | POA: Insufficient documentation

## 2024-06-09 DIAGNOSIS — Z9851 Tubal ligation status: Secondary | ICD-10-CM | POA: Insufficient documentation

## 2024-06-09 DIAGNOSIS — Z8701 Personal history of pneumonia (recurrent): Secondary | ICD-10-CM | POA: Diagnosis not present

## 2024-06-09 DIAGNOSIS — M419 Scoliosis, unspecified: Secondary | ICD-10-CM | POA: Insufficient documentation

## 2024-06-09 LAB — CMP (CANCER CENTER ONLY)
ALT: 18 U/L (ref 0–44)
AST: 17 U/L (ref 15–41)
Albumin: 4.1 g/dL (ref 3.5–5.0)
Alkaline Phosphatase: 72 U/L (ref 38–126)
Anion gap: 6 (ref 5–15)
BUN: 13 mg/dL (ref 6–20)
CO2: 28 mmol/L (ref 22–32)
Calcium: 9.5 mg/dL (ref 8.9–10.3)
Chloride: 105 mmol/L (ref 98–111)
Creatinine: 0.89 mg/dL (ref 0.44–1.00)
GFR, Estimated: >60 mL/min (ref >60)
Glucose, Bld: 95 mg/dL (ref 70–99)
Potassium: 4 mmol/L (ref 3.5–5.1)
Sodium: 139 mmol/L (ref 135–145)
Total Bilirubin: 0.6 mg/dL (ref 0.0–1.2)
Total Protein: 8 g/dL (ref 6.5–8.1)

## 2024-06-09 LAB — CBC WITH DIFFERENTIAL (CANCER CENTER ONLY)
Abs Immature Granulocytes: 0.01 K/uL (ref 0.00–0.07)
Basophils Absolute: 0 K/uL (ref 0.0–0.1)
Basophils Relative: 0 %
Eosinophils Absolute: 0.1 K/uL (ref 0.0–0.5)
Eosinophils Relative: 1 %
HCT: 39.8 % (ref 36.0–46.0)
Hemoglobin: 13.5 g/dL (ref 12.0–15.0)
Immature Granulocytes: 0 %
Lymphocytes Relative: 33 %
Lymphs Abs: 2.1 K/uL (ref 0.7–4.0)
MCH: 29.6 pg (ref 26.0–34.0)
MCHC: 33.9 g/dL (ref 30.0–36.0)
MCV: 87.3 fL (ref 80.0–100.0)
Monocytes Absolute: 0.4 K/uL (ref 0.1–1.0)
Monocytes Relative: 7 %
Neutro Abs: 3.7 K/uL (ref 1.7–7.7)
Neutrophils Relative %: 59 %
Platelet Count: 258 K/uL (ref 150–400)
RBC: 4.56 MIL/uL (ref 3.87–5.11)
RDW: 13.6 % (ref 11.5–15.5)
WBC Count: 6.3 K/uL (ref 4.0–10.5)
nRBC: 0 % (ref 0.0–0.2)

## 2024-06-09 LAB — GENETIC SCREENING ORDER

## 2024-06-09 NOTE — Progress Notes (Signed)
 [facility]   Telephone:([phone]) [phone] Fax:([phone]) [phone]   Clinic Follow up Note   Patient Care Team: [name], MD as PCP - General (Family Medicine) Aron Shoulders, MD as Consulting Physician (General Surgery) Lanny Callander, MD as Consulting Physician (Hematology) Bensimhon, Toribio SAUNDERS, MD as Consulting Physician (Cardiology) Mammography, [facility] as Radiologist (Diagnostic Radiology)  Date of Service:  06/09/2024  CHIEF COMPLAINT: f/u of breast cancer   CURRENT THERAPY:  Cancer surveillance  Oncology History   Breast cancer metastasized to lung [facility]) T3N1M1, stage IV, ER-/PR-/HER2+, ypT43miN0, NED -diagnosed 06/2014 with metastatic disease, s/p neoadjuvant chemo, right lumpectomy, adjuvant radiation and currently on Maintenance Herceptin  since 04/12/15. Her restaging scan has showed NED -10/17/2022 - CTA chest - negative for PE or thoracic aortic dissection. No acute findings. There is subpleural scarring of anterior right upper lobe. No new nodules or new disease noted.  -10/17/2022 - CT abdomen and pelvis - colonic diverticulosis. Right renal stone without hydronephrosis.  -new restaging CT CAP due 10/2023.  -She has been on maintenance Herceptin  for about 7.5 years.  She is probably cured, although I am not able to approve it. Will continue Herceptin  for now, I would consider stopping therapy after 10 years -she continues to tolerate herceptin  infusions well.  -03/20/2023 - patient complains of moderate to severe fatigue. She is unsure if this fatigue is similar to fatigue she has previously experienced related to chemotherapy treatments, or if this fatigue is worse. It has not been intrusive yet, but she feels like it will become intrusive soon. Increased concern because her aunt, also a breast cancer survivor, was diagnosed with liver cancer at the same time the patient was diagnosed with breast cancer, in 2015. The patient states that her aunt's only complaint was fatigue.   -Labs reviewed, overall stable. CBC normal. Glucose 162 with remainder of CMP normal.  -continue labs/flush and herceptin  infusions every 28 days.  -F/u in 4 months  -reviewed CTA chest and CT abdomen and pelvis with the patient, done 10/2022. There was no evidence of metastatic or new disease noted.  -repeated CT CAP in 03/2023 was negative for recurrence, echo in 04/2023 showed normal EF   -repeated CT in 01/2024 showed NED. ctDNA Signatera in 01/2024 was also negative, she is likely cured.  -will stop her treatment and monitor closely   Assessment & Plan Right breast cancer under active surveillance Right breast cancer is under active surveillance with no new symptoms or concerns reported. - Continue active surveillance with follow-up appointments every three months - Perform annual scan - Conduct Signatera testing every three months - Schedule mammogram in December  Right upper extremity pain and functional limitation post breast cancer surgery Right upper extremity pain and functional limitation likely related to previous breast cancer surgery, with symptoms of tenderness, cold sensation in fingers, and discomfort with movement. Symptoms have persisted since mid-September and are worsening. - Refer to physical therapy at [facility] - Advise use of heating pad for pain relief - Recommend ibuprofen  for pain management  Malfunctioning indwelling port Indwelling port, nearly ten years old, is malfunctioning with no blood return despite flushing. Given the age of the port and potential for further issues, removal is planned. She prefers removal this year to utilize current insurance coverage. - Schedule removal of the indwelling port with Dr. Briant Goodrich - Plan for blood draws from the arm post port removal  Plan - I will refer her to physical therapy for  right arm pain - She is scheduled for mammogram in December - Lab reviewed, Signatera still pending, we will call her  with results. - Continue cancer surveillance, I plan to see her back in 3 months with labs.     SUMMARY OF ONCOLOGIC HISTORY: Oncology History Overview Note  Breast cancer metastasized to lung   Staging form: Breast, AJCC 7th Edition     Clinical stage from 07/22/2014: Stage IV (T3, N1, M1) - Unsigned     Breast cancer metastasized to lung (HCC)  07/02/2014 Mammogram   Mammogram showed a 2cm right beast mass and a 1.8cm right axillary node. MRI breast on 07/16/2014 showed 7cm R breast lesion and 4.4cm r axillary node    07/02/2014 Imaging   CT CAP: a 4.7cm mass in LUL lung and a 2.1cm mas in RML, and a small nodule in RUL, suspecious for metastasis     07/09/2014 Initial Diagnosis   right IDA with b/l lung lesions, ER-/PR-/HER2+   07/09/2014 Initial Biopsy   US  guided right breast mass and axillary node biopsy showed IDA, and DCIS, ER-/PR-/HER2+   07/26/2014 Pathologic Stage   Left lung mass by IR, path revealed high grade carcinoma, morphology similar to breast tumor biopsy, TTF(-), NapsinA(-), ER(-)   08/04/2014 - 03/22/2015 Chemotherapy   weekly Paclitaxel  80mg /m2, trastuzumab  and pertuzumab  every 3 weeks   08/30/2014 Genetic Testing   BreastNext panel was negative. 17 genes including BRCA1, BRCA2, were negative for mutations.    10/04/2014 Imaging   Interval decrease in the right axillary lymphadenopathy. Bilateral pulmonary lesions with left hilar lymphadenopathy also markedly decreased in the interval. The left hilar lymphadenopathy has resolved.   12/20/2014 Imaging   restaging CT showed stable disease, no new lesions    03/29/2015 Pathology Results    right breast lumpectomy showed  chemotherapy treatment effect,  a 1 mm residual tumor,   margins were widely negative, 5 sentinel lymph nodes and 2 axillary lymph nodes were negative.    03/29/2015 Surgery    right breast lumpectomy and sentinel lymph node biopsy  by Dr. Aron   04/12/2015 -  Chemotherapy   Herceptin   maintenance therapy , 6 mg/kg, every 3 weeks since 04/12/15. Switched to Herceptin  Hylecta injection q3weeks after 06/10/19.   04/12/2015 - 10/04/2022 Chemotherapy   Patient is on Treatment Plan : BREAST Trastuzumab  q21d     05/03/2015 - 06/14/2015 Radiation Therapy   right breast adjuvant irradiation by Dr. Jason    08/28/2016 Imaging   CT CAP w Contrast  IMPRESSION: 1. No acute process or evidence of metastatic disease within the chest, abdomen, or pelvis. 2. Similar to less well-defined left upper lobe density, likely scarring. No evidence of new or progressive pulmonary metastasis. 3. Right nephrolithiasis.   01/22/2017 Imaging   CT Chest W Contrast 01/22/17 IMPRESSION: 1. Stable exam. No new or progressive findings. No evidence for metastatic disease. 2. Left upper lobe architectural distortion/scarring is stable. 3. Nonobstructing right renal stone.   07/31/2017 Imaging   Bone Scan Whole Body 07/31/17  IMPRESSION: 1. No scintigraphic evidence of osseous metastatic disease. 2. Thoracolumbar scoliosis.    07/31/2017 Imaging   CT CAP W Contrast 07/31/17 IMPRESSION: 1. No findings of active or recurrent malignancy. 2. Other imaging findings of potential clinical significance: Aortic Atherosclerosis (ICD10-I70.0). Mild cardiomegaly. Postoperative and radiation therapy findings in the right chest. Nonobstructive right nephrolithiasis. Thoracolumbar scoliosis.     01/20/2018 Echocardiogram   ECHO 01/20/2018 Study Conclusions   - Left ventricle: The  cavity size was normal. Wall thickness was   normal. Systolic function was normal. The estimated ejection   fraction was in the range of 55% to 60%. Wall motion was normal;   there were no regional wall motion abnormalities. Doppler   parameters are consistent with abnormal left ventricular   relaxation (grade 1 diastolic dysfunction). - Impressions: GLS -20.7% LS&' 10.4 cm.    02/17/2018 Imaging   02/17/2018 CT  CAP IMPRESSION: 1. No evidence for residual or recurrent tumor or metastatic disease. 2.  Aortic Atherosclerosis (ICD10-I70.0). 3. Right renal calculi. 4. Scoliosis.   08/11/2018 Imaging   CT CAP W Contrast 08/11/18  IMPRESSION: No evidence for localized recurrence or metastatic disease in the chest, abdomen or pelvis.   02/23/2019 Imaging   CT CAP W Contrast  IMPRESSION: 1. No findings of active malignancy in the chest, abdomen, or pelvis. 2. 3 mm right kidney lower pole nonobstructive renal calculus. 3. Thoracolumbar scoliosis.   12/08/2019 Imaging   CT CAP W contrast  IMPRESSION: 1. Unchanged post treatment appearance of the lungs. No evidence of recurrent or new metastatic disease in the chest, abdomen, or pelvis.   2. Postoperative findings of right lumpectomy and axillary lymph node dissection.   3.  Nonobstructive right nephrolithiasis.   11/17/2020 Imaging   CT CAP IMPRESSION: 1. No evidence of recurrence or new metastatic disease within the chest, abdomen, or pelvis. 2. Similar post treatment changes in the lungs and postoperative findings of right lumpectomy and axillary lymph node dissection. 3. Colonic diverticulosis without findings of acute diverticulitis.   08/10/2021 Genetic Testing   Negative genetic testing on the CancerNext-Expanded+RNAinsight panel.  The report date is August 10, 2021.  The CancerNext-Expanded gene panel offered by [facility] and includes sequencing and rearrangement analysis for the following 77 genes: AIP, ALK, APC*, ATM*, AXIN2, BAP1, BARD1, BLM, BMPR1A, BRCA1*, BRCA2*, BRIP1*, CDC73, CDH1*, CDK4, CDKN1B, CDKN2A, CHEK2*, CTNNA1, DICER1, FANCC, FH, FLCN, GALNT12, KIF1B, LZTR1, MAX, MEN1, MET, MLH1*, MSH2*, MSH3, MSH6*, MUTYH*, NBN, NF1*, NF2, NTHL1, PALB2*, PHOX2B, PMS2*, POT1, PRKAR1A, PTCH1, PTEN*, RAD51C*, RAD51D*, RB1, RECQL, RET, SDHA, SDHAF2, SDHB, SDHC, SDHD, SMAD4, SMARCA4, SMARCB1, SMARCE1, STK11, SUFU, TMEM127, TP53*,  TSC1, TSC2, VHL and XRCC2 (sequencing and deletion/duplication); EGFR, EGLN1, HOXB13, KIT, MITF, PDGFRA, POLD1, and POLE (sequencing only); EPCAM and GREM1 (deletion/duplication only). DNA and RNA analyses performed for * genes.    10/17/2022 Imaging   CTA Chest and CT abdomen and pelvis with contrast  IMPRESSION: 1. Negative for acute PE or thoracic aortic dissection. 2. No acute findings. 3. Colonic diverticulosis. 4. Right nephrolithiasis without hydronephrosis.   10/25/2022 - 10/25/2022 Chemotherapy   Patient is on Treatment Plan : BREAST MAINTENANCE Trastuzumab  IV (6) or SQ (600) D1 q21d X 11 Cycles     10/25/2022 -  Chemotherapy   Patient is on Treatment Plan : BREAST MAINTENANCE Trastuzumab  IV (6) or SQ (600) D1 q21d x 13 cycles        Discussed the use of AI scribe software for clinical note transcription with the patient, who gave verbal consent to proceed.  History of Present Illness [name] [name] is a 53 year old female with breast cancer who presents for follow-up.  She is experiencing issues with her port, with a lack of blood return during a recent attempt to draw blood. Although the port was flushed successfully, it required multiple attempts. This is the first occurrence of a complete lack of blood return, and the port is nearly ten years old.  She has discomfort in her right arm, associated with a possible finger injury in mid-September. The arm is not swollen, but there is tenderness and cold sensations in her fingers, with worsening symptoms over the past two weeks. Raising her arm causes pain, particularly when walking or moving. She manages the discomfort by keeping her thumb in her pocket to maintain a slight bend, although this is becoming less comfortable. The pain affects her ability to perform tasks like using a computer mouse, but she can still write and perform her job duties. She denies cough or breathing issues.     All other systems were reviewed with the  patient and are negative.  MEDICAL HISTORY:  Past Medical History:  Diagnosis Date   Allergy    Dust mites, ragweed, grass   Breast cancer (HCC)    Breast cancer (HCC)    CHF (congestive heart failure) (HCC)    Family history of breast cancer    Family history of prostate cancer    GERD (gastroesophageal reflux disease)    during pregnancy    Hypertension    Pneumonia    hx of pneumonia 08/2013    S/P radiation therapy 05/03/2015 through 06/14/2015                                                      Right breast 4680 cGy in 26 sessions, right breast boost 1000 cGy in 5 sessions.  Right supraclavicular/axillary region 4680 cGy with a supplemental PA field to bring the axillary dose up to 4500 cGy in 26 sessions    SURGICAL HISTORY: Past Surgical History:  Procedure Laterality Date   BREAST LUMPECTOMY WITH RADIOACTIVE SEED AND SENTINEL LYMPH NODE BIOPSY Right 03/29/2015   Procedure: BREAST LUMPECTOMY WITH RADIOACTIVE SEED AND SENTINEL LYMPH NODE BIOPSY;  Surgeon: Jina Nephew, MD;  Location: [facility];  Service: General;  Laterality: Right;   BREAST SURGERY     CESAREAN SECTION     CHOLECYSTECTOMY     ESSURE TUBAL LIGATION     PORTACATH PLACEMENT Left 07/21/2014   Procedure: INSERTION PORT-A-CATH;  Surgeon: Jina Nephew, MD;  Location: [facility];  Service: General;  Laterality: Left;   WISDOM TOOTH EXTRACTION      I have reviewed the social history and family history with the patient and they are unchanged from previous note.  ALLERGIES:  is allergic to adhesive [tape], aspirin, and codeine.  MEDICATIONS:  Current Outpatient Medications  Medication Sig Dispense Refill   acetaminophen  (TYLENOL ) 500 MG tablet Take 500 mg by mouth every 6 (six) hours as needed.     Ascorbic Acid (VITAMIN C) 500 MG CHEW      Calcium-Phosphorus-Vitamin D (CALCIUM GUMMIES PO) Take by mouth daily.     Camphor-Eucalyptus-Menthol (VICKS VAPORUB EX) Apply 1 application. topically at  bedtime. Applies under nose, throat and on chest as needed     celecoxib  (CELEBREX ) 100 MG capsule Take 1 capsule (100 mg total) by mouth 2 (two) times daily. 60 capsule 0   cetirizine (ZYRTEC) 10 MG tablet Take 10 mg by mouth daily as needed for allergies (allergies).      fluticasone (FLONASE) 50 MCG/ACT nasal spray Place into both nostrils daily.     gabapentin  (NEURONTIN ) 100 MG capsule TAKE 2 CAPSULES(200 MG) BY MOUTH AT BEDTIME 60 capsule 1  hydrocortisone  2.5 % cream Apply 1 application topically 2 (two) times daily as needed.     ibuprofen  (ADVIL ,MOTRIN ) 800 MG tablet Take 1 tablet (800 mg total) by mouth every 8 (eight) hours as needed. 60 tablet 1   lidocaine -prilocaine  (EMLA ) cream APPLY TO THE AFFECTED AREA PRIOR TO CHEMOTHERAPY AS DIRECTED 30 g 2   losartan  (COZAAR ) 25 MG tablet Take 1 tablet (25 mg total) by mouth daily. 90 tablet 1   meloxicam  (MOBIC ) 7.5 MG tablet Take 7.5 mg by mouth daily.     metaxalone  (SKELAXIN ) 800 MG tablet Take 1 tablet (800 mg total) by mouth 3 (three) times daily as needed for muscle spasms. 60 tablet 0   Multiple Vitamins-Minerals (MULTIVITAMIN GUMMIES ADULT PO) Take 1 each by mouth daily. Women's Vitafusion gummie     spironolactone  (ALDACTONE ) 25 MG tablet Take 1 tablet (25 mg total) by mouth daily. 90 tablet 1   No current facility-administered medications for this visit.   Facility-Administered Medications Ordered in Other Visits  Medication Dose Route Frequency Provider Last Rate Last Admin   sodium chloride  0.9 % injection 10 mL  10 mL Intravenous PRN Lanny Callander, MD       sodium chloride  flush (NS) 0.9 % injection 10 mL  10 mL Intravenous PRN Lanny Callander, MD        PHYSICAL EXAMINATION: ECOG PERFORMANCE STATUS: 0 - Asymptomatic  Vitals:   06/09/24 0906  BP: 138/74  Pulse: 73  Resp: 17  Temp: (!) 97.2 F (36.2 C)  SpO2: 99%   Wt Readings from Last 3 Encounters:  06/09/24 240 lb (108.9 kg)  04/07/24 239 lb 14.4 oz (108.8 kg)  03/26/24  238 lb 12.8 oz (108.3 kg)     GENERAL:alert, no distress and comfortable SKIN: skin color, texture, turgor are normal, no rashes or significant lesions EYES: normal, Conjunctiva are pink and non-injected, sclera clear NECK: supple, thyroid  normal size, non-tender, without nodularity LYMPH:  no palpable lymphadenopathy in the cervical, axillary  LUNGS: clear to auscultation and percussion with normal breathing effort HEART: regular rate & rhythm and no murmurs and no lower extremity edema ABDOMEN:abdomen soft, non-tender and normal bowel sounds Musculoskeletal:no cyanosis of digits and no clubbing  NEURO: alert & oriented x 3 with fluent speech, no focal motor/sensory deficits  Physical Exam EXTREMITIES: No ankle swelling.  LABORATORY DATA:  I have reviewed the data as listed    Latest Ref Rng & Units 06/09/2024    8:56 AM 02/17/2024    1:53 PM 01/20/2024    1:33 PM  CBC  WBC 4.0 - 10.5 K/uL 6.3  8.8  7.9   Hemoglobin 12.0 - 15.0 g/dL 86.4  87.2  88.1   Hematocrit 36.0 - 46.0 % 39.8  37.9  35.0   Platelets 150 - 400 K/uL 258  257  258         Latest Ref Rng & Units 06/09/2024    8:56 AM 02/17/2024    1:53 PM 01/20/2024    1:33 PM  CMP  Glucose 70 - 99 mg/dL 95  858  87   BUN 6 - 20 mg/dL 13  14  13    Creatinine 0.44 - 1.00 mg/dL 9.10  9.03  9.18   Sodium 135 - 145 mmol/L 139  139  138   Potassium 3.5 - 5.1 mmol/L 4.0  3.6  3.8   Chloride 98 - 111 mmol/L 105  105  103   CO2 22 - 32 mmol/L 28  27  29   Calcium 8.9 - 10.3 mg/dL 9.5  9.2  9.3   Total Protein 6.5 - 8.1 g/dL 8.0  7.7  7.4   Total Bilirubin 0.0 - 1.2 mg/dL 0.6  0.6  0.3   Alkaline Phos 38 - 126 U/L 72  64  69   AST 15 - 41 U/L 17  21  15    ALT 0 - 44 U/L 18  25  19        RADIOGRAPHIC STUDIES: I have personally reviewed the radiological images as listed and agreed with the findings in the report. No results found.    Orders Placed This Encounter  Procedures   Ambulatory referral to Physical Therapy     Referral Priority:   Routine    Referral Type:   Physical Medicine    Referral Reason:   Specialty Services Required    Requested Specialty:   Physical Therapy    Number of Visits Requested:   1   All questions were answered. The patient knows to call the clinic with any problems, questions or concerns. No barriers to learning was detected. The total time spent in the appointment was 25 minutes, including review of chart and various tests results, discussions about plan of care and coordination of care plan     Onita Mattock, MD 06/09/2024

## 2024-06-10 ENCOUNTER — Other Ambulatory Visit: Payer: Self-pay

## 2024-06-14 ENCOUNTER — Other Ambulatory Visit: Payer: Self-pay | Admitting: General Surgery

## 2024-06-15 LAB — SIGNATERA
SIGNATERA MTM READOUT: 0 MTM/ml
SIGNATERA TEST RESULT: NEGATIVE

## 2024-06-16 ENCOUNTER — Telehealth: Payer: Self-pay

## 2024-06-16 ENCOUNTER — Other Ambulatory Visit: Payer: Self-pay

## 2024-06-16 NOTE — Telephone Encounter (Signed)
 Contacted patient via telephone call. Let patient know that her Signatera result was NEGATIVE. Patient voiced understanding and did not have any further concerns.

## 2024-06-22 ENCOUNTER — Encounter: Payer: Self-pay | Admitting: Hematology

## 2024-06-22 ENCOUNTER — Other Ambulatory Visit: Payer: Self-pay | Admitting: General Surgery

## 2024-06-22 ENCOUNTER — Other Ambulatory Visit: Payer: Self-pay

## 2024-06-22 DIAGNOSIS — Z95828 Presence of other vascular implants and grafts: Secondary | ICD-10-CM

## 2024-06-22 DIAGNOSIS — C50911 Malignant neoplasm of unspecified site of right female breast: Secondary | ICD-10-CM

## 2024-06-23 NOTE — Therapy (Unsigned)
 OUTPATIENT PHYSICAL THERAPY CERVICAL/UE  EVALUATION   Patient Name: [name] [name] MRN: [id number] DOB:11/13/1970, 53 y.o., female Today's Date: 06/24/2024   END OF SESSION:  PT End of Session - 06/24/24 0849     Visit Number 1    Date for Recertification  08/19/24    PT Start Time 0849    PT Stop Time 0934    PT Time Calculation (min) 45 min          Past Medical History:  Diagnosis Date   Allergy    Dust mites, ragweed, grass   Breast cancer (HCC)    Breast cancer (HCC)    CHF (congestive heart failure) (HCC)    Family history of breast cancer    Family history of prostate cancer    GERD (gastroesophageal reflux disease)    during pregnancy    Hypertension    Pneumonia    hx of pneumonia 08/2013    S/P radiation therapy 05/03/2015 through 06/14/2015                                                      Right breast 4680 cGy in 26 sessions, right breast boost 1000 cGy in 5 sessions.  Right supraclavicular/axillary region 4680 cGy with a supplemental PA field to bring the axillary dose up to 4500 cGy in 26 sessions   Past Surgical History:  Procedure Laterality Date   BREAST LUMPECTOMY WITH RADIOACTIVE SEED AND SENTINEL LYMPH NODE BIOPSY Right 03/29/2015   Procedure: BREAST LUMPECTOMY WITH RADIOACTIVE SEED AND SENTINEL LYMPH NODE BIOPSY;  Surgeon: Jina Nephew, MD;  Location: [facility];  Service: General;  Laterality: Right;   BREAST SURGERY     CESAREAN SECTION     CHOLECYSTECTOMY     ESSURE TUBAL LIGATION     PORTACATH PLACEMENT Left 07/21/2014   Procedure: INSERTION PORT-A-CATH;  Surgeon: Jina Nephew, MD;  Location: [facility];  Service: General;  Laterality: Left;   WISDOM TOOTH EXTRACTION     Patient Active Problem List   Diagnosis Date Noted   Abnormal endometrial ultrasound 03/16/2024   Abnormal uterine bleeding (AUB) 03/16/2024   Other fatigue 03/20/2023   Hyperglycemia 03/20/2023   Family history of liver cancer 03/20/2023   Genetic  testing 08/10/2021   Family history of prostate cancer 07/27/2021   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   Seborrheic dermatitis of scalp 06/29/2021   Prediabetes 12/27/2020   Benign essential hypertension 11/12/2019   Dysmenorrhea 12/12/2018   Port-A-Cath in place 08/21/2018   Vitamin D deficiency 07/15/2018   Candidiasis of breast 04/05/2016   Eczema 04/05/2016   Port-A-Cath in place 12/06/2015   Seasonal allergies 10/04/2015   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   Peripheral neuropathy due to chemotherapy 03/15/2015   Breast cancer 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 (HCC) 07/22/2014   Family history of breast cancer 07/22/2014   Lung mas bilaterial 07/22/2014    PCP: Colette Torrence CINDERELLA, MD   REFERRING PROVIDER: Lanny Callander, MD   REFERRING DIAG: C50.911,C78.00 (ICD-10-CM) - 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   Patient was dx'd with R breast CA with mets to lung in 2015 and had lumpectomy, chemo, and radiation.  Radiation was to R anterior lung and she had axillary lymph node dissection.   Her scans have showed no evidence of disease for the following years since, but she does have R lung scarring.  Oncology note states patient is likely cured and she has stopped all medications.   Patient symptoms are pain in the R dorsal arm from just above her elbow almost to the wrist.  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   States she has never had the neuropathy advance more proximally than her fingertips.  States this pain is different than her finger neuropathy in that it is much sharper and different location.   She states the pain is constant of varying intensity.   Pain started 2 months ago for no apparent reason.   She denies any trauma, falls,  or changes in her activity level.   She is not involved in any sports activities.  Pain is over the R elbow and dorsal forearm.     Straightening her R elbow increases pain.   Full elbow flexion and reaching behind her back or head also increases pain.  Holding the elbow in midrange feels best.  Works at a desk all day and is using her mouse constantly.    Hand dominance: Right  PAIN: Are you having pain? Yes: NPRS scale: 6/10 now; 2/10 best; 8/10 worst Pain location: R dorsal forearm to just above the elbow Pain description: shooting, stabbing Aggravating factors: fully straightening her elbow or fully bending the elbow Relieving factors: keeping her elbow in mid range positions  PERTINENT HISTORY:  Chemo induced peripheral neuropathy, HTN, GERD  PRECAUTIONS: Other: L breast CA lymphedema precautions  RED FLAGS: None  HAND DOMINANCE: Right  WEIGHT BEARING RESTRICTIONS: No  FALLS:  Has patient fallen in last 6 months? 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   RADIATION DOSE REDUCTION: This exam was performed according to the departmental dose-optimization program which includes automated exposure control, adjustment of the mA and/or kV according to patient size and/or use of iterative reconstruction technique.   CONTRAST:  OMNIPAQUE  IOHEXOL  300 MG/ML  SOLN   COMPARISON:  04/03/2023   FINDINGS: CT CHEST FINDINGS   Cardiovascular: Left chest port catheter. Normal heart size. No pericardial effusion.   Mediastinum/Nodes: No enlarged mediastinal, hilar, or axillary lymph nodes. Thyroid  gland, trachea, and esophagus demonstrate no significant findings.   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 Irregular scarring in the suprahilar left upper lobe (series 4, image 51). No pleural effusion or pneumothorax.  Musculoskeletal: Status post lower inner right lumpectomy and right axillary lymph node dissection. No acute osseous findings.   CT ABDOMEN PELVIS FINDINGS   Hepatobiliary: No focal liver abnormality is seen. Hepatic steatosis. Status post cholecystectomy. No biliary dilatation.   Pancreas: Unremarkable. No pancreatic ductal dilatation or surrounding inflammatory changes.   Spleen: Normal in size without significant abnormality.   Adrenals/Urinary Tract: Adrenal glands are unremarkable. Small nonobstructive right renal calculi. No left-sided calculi, ureteral calculi, or hydronephrosis. Bladder is unremarkable.   Stomach/Bowel: Stomach is within normal limits. Appendix appears normal. No evidence of bowel wall thickening, distention, or inflammatory changes. Pancolonic diverticulosis.   Vascular/Lymphatic: No significant vascular findings are present. No enlarged abdominal or pelvic lymph nodes.   Reproductive: No mass or other abnormality.  Tubal  ligation devices.   Other: Small fat containing umbilical hernia.  No ascites.   Musculoskeletal: No acute osseous findings. Gentle S shaped scoliosis of the thoracolumbar spine.   IMPRESSION: 1. Status post lower inner right lumpectomy and right axillary lymph node dissection. 2. 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 Irregular scarring in the suprahilar left upper lobe. 3. No evidence of lymphadenopathy or metastatic disease in the chest, abdomen, or pelvis. 4. Hepatic steatosis. 5. Small nonobstructive right renal calculi. 6. Pancolonic diverticulosis without evidence of acute diverticulitis.     Electronically Signed   By: Marolyn JONETTA Jaksch M.D.   On: 01/30/2024 10:36  PATIENT SURVEYS:  Quick Dash:  QUICK DASH  Please rate your ability do the following activities in the last week by selecting the number below the appropriate response.   Activities Rating  Open a tight or new jar.  2 = Mild difficulty  Do heavy household chores (e.g., wash walls, floors). 5 = Unable  Carry a shopping bag or briefcase 2 = Mild difficulty  Wash your back. 3 = Moderate difficulty  Use a knife to cut food. 3 = Moderate difficulty  Recreational activities in which you take some force or impact through your arm, shoulder or hand (e.g., golf, hammering, tennis, etc.). 2 = Mild difficulty  During the past week, to what extent has your arm, shoulder or hand problem interfered with your normal social activities with family, friends, neighbors or groups?  1 = Not at all  During the past week, were you limited in your work or other regular daily activities as a result of your arm, shoulder or hand problem? 1 = Not limited at all  Rate the severity of the following symptoms in the last week: Arm, Shoulder, or hand pain. 3 = Moderate  Rate the severity of the following symptoms in the last week: Tingling (pins and needles) in your arm, shoulder or hand. 3 = Moderate  During the past  week, how much difficulty have you had sleeping because of the pain in your arm, shoulder or hand?  3 = Moderate difficulty   (A QuickDASH score may not be calculated if there is greater than 1 missing item.)  Quick Dash Disability/Symptom Score: [(sum of 28  (n) responses/11 (n)] x 25 = 38.63   Minimally Clinically Important Difference (MCID): 15-20 points  (Franchignoni, F. et al. (2013). Minimally clinically important difference of the disabilities of the arm, shoulder, and hand outcome measures (DASH) and its shortened version (Quick DASH). Journal of Orthopaedic & Sports Physical Therapy, 44(1), 30-39)   COGNITION: Overall cognitive status: Within functional limits for tasks assessed  SENSATION: WFL  POSTURE:  No Significant postural limitations  PALPATION: Very  TTP just above the R lateral elbow, over the epicondyle, common extensor tendon   CERVICAL ROM: all cervical motions are pain free and do not affect the R elbow pain  Active ROM A/PROM  eval  Flexion 100%  Extension 65%  Right lateral flexion 75%  Left lateral flexion 50%  Right rotation 90%  Left rotation 90%   (Blank rows = not tested)  UPPER EXTREMITY ROM:  Active ROM Right eval Left eval  Shoulder flexion 160; p! 180  Shoulder extension    Shoulder abduction 150; p! 180  Shoulder adduction    Shoulder internal rotation L5; p! T12  Shoulder external rotation At side: 70 deg no pain;   at 90: 50 deg with pain 90 at side and at 90  Elbow flexion Full and equal to LUE, but painful   Elbow extension -15; pain 0  Wrist flexion 70; some pain 75  Wrist extension 90; no increased pain 90  Wrist ulnar deviation    Wrist radial deviation    Wrist pronation Full and equal BUE   Wrist supination Full and equal BUE    (Blank rows = not tested)  UPPER EXTREMITY MMT:  MMT Right eval Left eval  Shoulder flexion 4; limited by elbow p! 5  Shoulder extension    Shoulder abduction 4; limited by elbow p! 5   Shoulder adduction    Shoulder internal rotation 5 5  Shoulder external rotation 4; elbow p! 5  Middle trapezius    Lower trapezius    Elbow flexion 4; p! 5  Elbow extension 4; p! 5  Wrist flexion 5 5  Wrist extension 4+; some pain 5  Wrist ulnar deviation    Wrist radial deviation    Wrist pronation 4+; some p! 5  Wrist supination 4-; lots pain 5  Grip strength Weaker due to pain    (Blank rows = not tested)  CERVICAL SPECIAL TESTS:  Spurling's test: Negative and Distraction test: Negative   ULTT is positive for radial, ulnar, and median nerves Hand grip is good strength, a little painful  Resisted pronation is good a little painful  Resisted supination is very painful and weak  Finger abduction strength is equal BLE  Thoracic outlet tests are difficult to complete due to increased pain with straightening her elbow  Tinel's testing is negative over all R elbow bony prominences and over the ulnar nerve notch (she denies any falls or trauma to R elbow)  FUNCTIONAL TESTS:  NA     TODAY'S TREATMENT:  06/24/24 SELF CARE: Provided education on PT POC progression., initial HEP, tennis elbow strap, using ice for pain relief, ionto education handout;  self massage of the lateral epicondyle  Ionto Patch (#1 of 6) to R elbow - Dexamethasone  1 mL, 80 mA-min, 4-6 hr wear time    PATIENT EDUCATION:  Education details: PT eval findings, anticipated POC, and initial HEP  Person educated: Patient Education method: Explanation, Demonstration, Verbal cues, Tactile cues, Handouts, and MedBridgeGO app access provided Education comprehension: verbalized understanding, verbal cues required, tactile cues required, and needs further education  HOME EXERCISE PROGRAM: Access Code: [email or web address] URL: [email or web address] Date: 06/24/2024 Prepared by: Garnette Montclair  Exercises - Seated Eccentric Wrist Extension  - 1 x daily - 7 x weekly - 3 sets - 10 reps - Seated Wrist Flexion  Stretch  - 1 x daily - 7 x weekly - 1 sets - 2 reps - 1 min hold - Standing Wrist Flexion Stretch with Table  -  1 x daily - 7 x weekly - 1 sets - 2 reps - 1 min hold - Seated Wrist Supination Pronation with Can  - 1 x daily - 7 x weekly - 1 sets - 10 reps  ASSESSMENT:  CLINICAL IMPRESSION: [name] [name] is a 53 y.o. female who was referred to physical therapy for evaluation and treatment for RUE pain/weakness/numbness  Patient reports onset of R lateral elbow and forearm pain beginning 2 months ago for no apparent reason. Pain is worse with fully bending or fully extending her R elbow.  She is very tender to palpate over the lateral epicondyle and common extensor tendon.  She has also positive nerve tension tests for the radial, ulnar, and median nerves.  Her dorsal arm pain is also influenced by shoulder rotational movements.  Cervical spine does not seem to be involved.   Differential includes thoracic outlet, peripheral nerve pathology/entrapment, lateral epicondylitis, or a combo of them.  She does have h/o radiation and lymph node removal for R breast CA so brachial plexus pathology could be contributing to her symptoms as well.  Patient has deficits in R elbow ROM, RUE flexibility, RUE strength, with pain and TTP of the R lateral elbow and dorsal forearm which are interfering with ADLs and are impacting quality of life.  On QuickDash patient scored 38% demonstrating moderate functional limitation of the RUE.  Adelae will benefit from skilled PT to address above deficits to improve mobility and activity tolerance with decreased pain interference.  OBJECTIVE IMPAIRMENTS: decreased ROM, decreased strength, impaired flexibility, impaired UE functional use, and pain.   ACTIVITY LIMITATIONS: carrying, lifting, reach over head, and hygiene/grooming  PARTICIPATION LIMITATIONS: meal prep, cleaning, laundry, community activity, and occupation  PERSONAL FACTORS: Age, Fitness, and Time since onset of  injury/illness/exacerbation are also affecting patient's functional outcome.   REHAB POTENTIAL: Good  CLINICAL DECISION MAKING: Evolving/moderate complexity  EVALUATION COMPLEXITY: Moderate   GOALS: Goals reviewed with patient? Yes  SHORT TERM GOALS: Target date: 07/22/2024   Patient will be independent with initial HEP to improve outcomes and carryover.  Baseline: 100% PT assist required for correct completion Goal status: INITIAL  2.  Patient will report 25% improvement in neck pain to improve QOL.  Baseline: 8/10 worst Goal status: INITIAL   LONG TERM GOALS: Target date: 08/19/2024   Patient will be independent with ongoing/advanced HEP for self-management at home.  Baseline: no advanced HEP yet Goal status: INITIAL  3.  Patient will report 50-75% improvement in neck pain to improve QOL.  Baseline: 8/10 worst Goal status: INITIAL   5.  Patient will demonstrate full pain free R elbow flexion and extension ROM to be able to do ADL, housework, and job unhindered.  Baseline: Refer to above UE ROM table Goal status: INITIAL  6.  Patient will report </= 20% on (MCID = 14%) to demonstrate improved functional ability.  Baseline: 38% Goal status: INITIAL  7.  Patient will demonstrate 5/5 strength throughout the RUE to be able to do functional tasks without danger of dropping objects or causing self injury   Baseline: see MMT tables above Goal status: INITIAL   PLAN:  PT FREQUENCY: 1-2x/week  PT DURATION: 8 weeks  PLANNED INTERVENTIONS: 97164- PT Re-evaluation, 97110-Therapeutic exercises, 97530- Therapeutic activity, 97112- Neuromuscular re-education, 97535- Self Care, [id number]- Manual therapy, [id number]- Aquatic Therapy, H9716- Electrical stimulation (unattended), 97016- Vasopneumatic device, [id number]- Ultrasound, [id number]- Traction (mechanical), [id number]- Ionotophoresis 4mg /ml Dexamethasone , 79439 (1-2 muscles), 20561 (3+ muscles)- Dry Needling, Patient/Family education, Taping,  Joint  mobilization, Spinal mobilization, Cryotherapy, and Moist heat  PLAN FOR NEXT SESSION: see how ionto and HEP affected symptoms, progress with eccentric strengthening program for elbow/wrist, add nerve glides for radial/median/ulnar nerves, pec stretching;  continue ionto vs KT tape unless adhesive sensitivity prevents.   Emmalina Espericueta, PT 06/24/2024, 9:42 AM

## 2024-06-24 ENCOUNTER — Ambulatory Visit: Attending: Hematology | Admitting: Rehabilitation

## 2024-06-24 ENCOUNTER — Other Ambulatory Visit: Payer: Self-pay

## 2024-06-24 ENCOUNTER — Encounter: Payer: Self-pay | Admitting: Rehabilitation

## 2024-06-24 DIAGNOSIS — M25621 Stiffness of right elbow, not elsewhere classified: Secondary | ICD-10-CM | POA: Diagnosis not present

## 2024-06-24 DIAGNOSIS — M25521 Pain in right elbow: Secondary | ICD-10-CM | POA: Insufficient documentation

## 2024-06-24 DIAGNOSIS — C7800 Secondary malignant neoplasm of unspecified lung: Secondary | ICD-10-CM | POA: Insufficient documentation

## 2024-06-24 DIAGNOSIS — C50911 Malignant neoplasm of unspecified site of right female breast: Secondary | ICD-10-CM | POA: Insufficient documentation

## 2024-06-24 DIAGNOSIS — M6281 Muscle weakness (generalized): Secondary | ICD-10-CM | POA: Insufficient documentation

## 2024-06-29 ENCOUNTER — Encounter: Payer: Self-pay | Admitting: Rehabilitation

## 2024-07-01 ENCOUNTER — Ambulatory Visit: Payer: Self-pay

## 2024-07-01 DIAGNOSIS — C50911 Malignant neoplasm of unspecified site of right female breast: Secondary | ICD-10-CM | POA: Diagnosis not present

## 2024-07-01 DIAGNOSIS — M25521 Pain in right elbow: Secondary | ICD-10-CM | POA: Diagnosis not present

## 2024-07-01 DIAGNOSIS — M25621 Stiffness of right elbow, not elsewhere classified: Secondary | ICD-10-CM

## 2024-07-01 DIAGNOSIS — C7800 Secondary malignant neoplasm of unspecified lung: Secondary | ICD-10-CM | POA: Diagnosis not present

## 2024-07-01 DIAGNOSIS — M6281 Muscle weakness (generalized): Secondary | ICD-10-CM | POA: Diagnosis not present

## 2024-07-01 NOTE — Therapy (Signed)
 OUTPATIENT PHYSICAL THERAPY CERVICAL/UE TREATMENT   Patient Name: [name] [name] MRN: [id number] DOB:27-Jul-1971, 53 y.o., female Today's Date: 07/01/2024   END OF SESSION:  PT End of Session - 07/01/24 1109     Visit Number 2    Date for Recertification  08/19/24    PT Start Time 1018    PT Stop Time 1104    PT Time Calculation (min) 46 min           Past Medical History:  Diagnosis Date   Allergy    Dust mites, ragweed, grass   Breast cancer (HCC)    Breast cancer (HCC)    CHF (congestive heart failure) (HCC)    Family history of breast cancer    Family history of prostate cancer    GERD (gastroesophageal reflux disease)    during pregnancy    Hypertension    Pneumonia    hx of pneumonia 08/2013    S/P radiation therapy 05/03/2015 through 06/14/2015                                                      Right breast 4680 cGy in 26 sessions, right breast boost 1000 cGy in 5 sessions.  Right supraclavicular/axillary region 4680 cGy with a supplemental PA field to bring the axillary dose up to 4500 cGy in 26 sessions   Past Surgical History:  Procedure Laterality Date   BREAST LUMPECTOMY WITH RADIOACTIVE SEED AND SENTINEL LYMPH NODE BIOPSY Right 03/29/2015   Procedure: BREAST LUMPECTOMY WITH RADIOACTIVE SEED AND SENTINEL LYMPH NODE BIOPSY;  Surgeon: Jina Nephew, MD;  Location: [facility];  Service: General;  Laterality: Right;   BREAST SURGERY     CESAREAN SECTION     CHOLECYSTECTOMY     ESSURE TUBAL LIGATION     PORTACATH PLACEMENT Left 07/21/2014   Procedure: INSERTION PORT-A-CATH;  Surgeon: Jina Nephew, MD;  Location: [facility];  Service: General;  Laterality: Left;   WISDOM TOOTH EXTRACTION     Patient Active Problem List   Diagnosis Date Noted   Abnormal endometrial ultrasound 03/16/2024   Abnormal uterine bleeding (AUB) 03/16/2024   Other fatigue 03/20/2023   Hyperglycemia 03/20/2023   Family history of liver cancer 03/20/2023   Genetic  testing 08/10/2021   Family history of prostate cancer 07/27/2021   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   Seborrheic dermatitis of scalp 06/29/2021   Prediabetes 12/27/2020   Benign essential hypertension 11/12/2019   Dysmenorrhea 12/12/2018   Port-A-Cath in place 08/21/2018   Vitamin D deficiency 07/15/2018   Candidiasis of breast 04/05/2016   Eczema 04/05/2016   Port-A-Cath in place 12/06/2015   Seasonal allergies 10/04/2015   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   Peripheral neuropathy due to chemotherapy 03/15/2015   Breast cancer 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 (HCC) 07/22/2014   Family history of breast cancer 07/22/2014   Lung mas bilaterial 07/22/2014    PCP: Colette Torrence CINDERELLA, MD   REFERRING PROVIDER: Lanny Callander, MD   REFERRING DIAG: C50.911,C78.00 (ICD-10-CM) - 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    53 y/o patient referred to PT for RUE pain/weakness/numbness from her oncologist.   Patient was dx'd with R breast CA with mets to lung in 2015 and had lumpectomy, chemo, and radiation.  Radiation was to R anterior lung and she had axillary lymph node dissection.   Her scans have showed no evidence of disease for the following years since, but she does have R lung scarring.  Oncology note states patient is likely cured and she has stopped all medications.   Patient symptoms are pain in the R dorsal arm from just above her elbow almost to the wrist.  She does have a  dx of chemo induced peripheral neuropathy which only affects her fingertips and feet with some numb/paresthetic sensations.   States she has never had the neuropathy advance more proximally than her fingertips.  States this pain is different than her finger neuropathy in that it is much sharper and different location.   She states the pain is constant of varying intensity.   Pain started 2 months ago for no apparent reason.   She denies any trauma, falls,  or changes in her activity level.   She is not involved in any sports activities.  Pain is over the R elbow and dorsal forearm.     Straightening her R elbow increases pain.   Full elbow flexion and reaching behind her back or head also increases pain.  Holding the elbow in midrange feels best.  Works at a desk all day and is using her mouse constantly.    Hand dominance: Right  PAIN: Are you having pain? Yes: NPRS scale: 6/10 now; 2/10 best; 8/10 worst Pain location: R dorsal forearm to just above the elbow Pain description: shooting, stabbing Aggravating factors: fully straightening her elbow or fully bending the elbow Relieving factors: keeping her elbow in mid range positions  PERTINENT HISTORY:  Chemo induced peripheral neuropathy, HTN, GERD  PRECAUTIONS: Other: L breast CA lymphedema precautions  RED FLAGS: None  HAND DOMINANCE: Right  WEIGHT BEARING RESTRICTIONS: No  FALLS:  Has patient fallen in last 6 months? 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   RADIATION DOSE REDUCTION: This exam was performed according to the departmental dose-optimization program which includes automated exposure control, adjustment of the mA and/or kV according to patient size and/or use of iterative reconstruction technique.   CONTRAST:  OMNIPAQUE  IOHEXOL  300 MG/ML  SOLN   COMPARISON:  04/03/2023   FINDINGS: CT CHEST FINDINGS   Cardiovascular: Left chest port catheter. Normal heart size. No pericardial effusion.   Mediastinum/Nodes: No enlarged mediastinal, hilar, or axillary lymph nodes. Thyroid  gland, trachea, and esophagus demonstrate no significant findings.   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 Irregular scarring in the suprahilar left  upper lobe (series 4, image 51). No pleural effusion or pneumothorax.   Musculoskeletal: Status post lower inner right lumpectomy and right axillary lymph node dissection. No acute osseous findings.   CT ABDOMEN PELVIS FINDINGS   Hepatobiliary: No focal liver abnormality is seen. Hepatic steatosis. Status post cholecystectomy. No biliary dilatation.   Pancreas: Unremarkable. No pancreatic ductal dilatation or surrounding inflammatory changes.   Spleen: Normal in size without significant abnormality.   Adrenals/Urinary Tract: Adrenal glands are unremarkable. Small nonobstructive right renal calculi. No left-sided calculi, ureteral calculi, or hydronephrosis. Bladder is unremarkable.   Stomach/Bowel: Stomach is within normal limits. Appendix appears normal. No evidence of bowel wall thickening, distention, or inflammatory changes. Pancolonic diverticulosis.   Vascular/Lymphatic: No significant vascular findings are present. No enlarged  abdominal or pelvic lymph nodes.   Reproductive: No mass or other abnormality.  Tubal ligation devices.   Other: Small fat containing umbilical hernia.  No ascites.   Musculoskeletal: No acute osseous findings. Gentle S shaped scoliosis of the thoracolumbar spine.   IMPRESSION: 1. Status post lower inner right lumpectomy and right axillary lymph node dissection. 2. 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 Irregular scarring in the suprahilar left upper lobe. 3. No evidence of lymphadenopathy or metastatic disease in the chest, abdomen, or pelvis. 4. Hepatic steatosis. 5. Small nonobstructive right renal calculi. 6. Pancolonic diverticulosis without evidence of acute diverticulitis.     Electronically Signed   By: Marolyn JONETTA Jaksch M.D.   On: 01/30/2024 10:36  PATIENT SURVEYS:  Quick Dash:  QUICK DASH  Please rate your ability do the following activities in the last week by selecting the number below the appropriate response.   Activities Rating  Open a tight or new jar.  2 = Mild difficulty  Do heavy household chores (e.g., wash walls, floors). 5 = Unable  Carry a shopping bag or briefcase 2 = Mild difficulty  Wash your back. 3 = Moderate difficulty  Use a knife to cut food. 3 = Moderate difficulty  Recreational activities in which you take some force or impact through your arm, shoulder or hand (e.g., golf, hammering, tennis, etc.). 2 = Mild difficulty  During the past week, to what extent has your arm, shoulder or hand problem interfered with your normal social activities with family, friends, neighbors or groups?  1 = Not at all  During the past week, were you limited in your work or other regular daily activities as a result of your arm, shoulder or hand problem? 1 = Not limited at all  Rate the severity of the following symptoms in the last week: Arm, Shoulder, or hand pain. 3 = Moderate  Rate the severity of the following symptoms in the last week:  Tingling (pins and needles) in your arm, shoulder or hand. 3 = Moderate  During the past week, how much difficulty have you had sleeping because of the pain in your arm, shoulder or hand?  3 = Moderate difficulty   (A QuickDASH score may not be calculated if there is greater than 1 missing item.)  Quick Dash Disability/Symptom Score: [(sum of 28  (n) responses/11 (n)] x 25 = 38.63   Minimally Clinically Important Difference (MCID): 15-20 points  (Franchignoni, F. et al. (2013). Minimally clinically important difference of the disabilities of the arm, shoulder, and hand outcome measures (DASH) and its shortened version (Quick DASH). Journal of Orthopaedic & Sports Physical Therapy, 44(1), 30-39)   COGNITION: Overall cognitive status: Within functional limits for tasks assessed  SENSATION: WFL  POSTURE:  No Significant postural limitations  PALPATION: Very TTP just above the R lateral elbow, over the epicondyle, common extensor tendon   CERVICAL ROM: all cervical motions are pain free and do not affect the R elbow pain  Active ROM A/PROM  eval  Flexion 100%  Extension 65%  Right lateral flexion 75%  Left lateral flexion 50%  Right rotation 90%  Left rotation 90%   (Blank rows = not tested)  UPPER EXTREMITY ROM:  Active ROM Right eval Left eval  Shoulder flexion 160; p! 180  Shoulder extension    Shoulder abduction 150; p! 180  Shoulder adduction    Shoulder internal rotation L5; p! T12  Shoulder external rotation At side: 70 deg no pain;   at 90: 50 deg with pain 90 at side and at 90  Elbow flexion Full and equal to LUE, but painful   Elbow extension -15; pain 0  Wrist flexion 70; some pain 75  Wrist extension 90; no increased pain 90  Wrist ulnar deviation    Wrist radial deviation    Wrist pronation Full and equal BUE   Wrist supination Full and equal BUE    (Blank rows = not tested)  UPPER EXTREMITY MMT:  MMT Right eval Left eval  Shoulder flexion 4; limited  by elbow p! 5  Shoulder extension    Shoulder abduction 4; limited by elbow p! 5  Shoulder adduction    Shoulder internal rotation 5 5  Shoulder external rotation 4; elbow p! 5  Middle trapezius    Lower trapezius    Elbow flexion 4; p! 5  Elbow extension 4; p! 5  Wrist flexion 5 5  Wrist extension 4+; some pain 5  Wrist ulnar deviation    Wrist radial deviation    Wrist pronation 4+; some p! 5  Wrist supination 4-; lots pain 5  Grip strength Weaker due to pain    (Blank rows = not tested)  CERVICAL SPECIAL TESTS:  Spurling's test: Negative and Distraction test: Negative   ULTT is positive for radial, ulnar, and median nerves Hand grip is good strength, a little painful  Resisted pronation is good a little painful  Resisted supination is very painful and weak  Finger abduction strength is equal BLE  Thoracic outlet tests are difficult to complete due to increased pain with straightening her elbow  Tinel's testing is negative over all R elbow bony prominences and over the ulnar nerve notch (she denies any falls or trauma to R elbow)  FUNCTIONAL TESTS:  NA     TODAY'S TREATMENT:  07/01/24 THERAPEUTIC EXERCISE: To improve strength, endurance, ROM, and flexibility.  UBE 3 min fwd/ 3 min back Seated L wrist extension 1lb x 20 Seated L forearm pronation/supination x 20 1lb Seated green theraputty squeeze x 12; three finger pinch for fine motor skill x 10 MANUAL THERAPY: To promote normalized muscle tension, improve joint mobility and/or for pain modulation  Massage gun to R wrist extensors close to CET near elbow Ionto Patch (#1 of 6) to R elbow - Dexamethasone  1 mL, 80 mA-min, 4-6 hr wear time  06/24/24 SELF CARE: Provided education on PT POC progression., initial HEP, tennis elbow strap, using ice for pain relief, ionto education handout;  self massage of the lateral epicondyle  Ionto Patch (#1 of 6) to R elbow - Dexamethasone  1 mL, 80 mA-min, 4-6 hr wear time     PATIENT EDUCATION:  Education details: PT eval findings, anticipated  POC, and initial HEP  Person educated: Patient Education method: Explanation, Demonstration, Verbal cues, Tactile cues, Handouts, and MedBridgeGO app access provided Education comprehension: verbalized understanding, verbal cues required, tactile cues required, and needs further education  HOME EXERCISE PROGRAM: Access Code: [email or web address] URL: [email or web address] Date: 06/24/2024 Prepared by: Garnette Montclair  Exercises - Seated Eccentric Wrist Extension  - 1 x daily - 7 x weekly - 3 sets - 10 reps - Seated Wrist Flexion Stretch  - 1 x daily - 7 x weekly - 1 sets - 2 reps - 1 min hold - Standing Wrist Flexion Stretch with Table  - 1 x daily - 7 x weekly - 1 sets - 2 reps - 1 min hold - Seated Wrist Supination Pronation with Can  - 1 x daily - 7 x weekly - 1 sets - 10 reps  ASSESSMENT:  CLINICAL IMPRESSION: Pt did ok with exercises, pain is worse with resisted wrist flexion. TTP over wrist extensors and CET.  Able to complete interventions with verb and tactile cues   [name] [name] is a 53 y.o. female who was referred to physical therapy for evaluation and treatment for RUE pain/weakness/numbness  Patient reports onset of R lateral elbow and forearm pain beginning 2 months ago for no apparent reason. Pain is worse with fully bending or fully extending her R elbow.  She is very tender to palpate over the lateral epicondyle and common extensor tendon.  She has also positive nerve tension tests for the radial, ulnar, and median nerves.  Her dorsal arm pain is also influenced by shoulder rotational movements.  Cervical spine does not seem to be involved.   Differential includes thoracic outlet, peripheral nerve pathology/entrapment, lateral epicondylitis, or a combo of them.  She does have h/o radiation and lymph node removal for R breast CA so brachial plexus pathology could be contributing to her symptoms  as well.  Patient has deficits in R elbow ROM, RUE flexibility, RUE strength, with pain and TTP of the R lateral elbow and dorsal forearm which are interfering with ADLs and are impacting quality of life.  On QuickDash patient scored 38% demonstrating moderate functional limitation of the RUE.  Soniya will benefit from skilled PT to address above deficits to improve mobility and activity tolerance with decreased pain interference.  OBJECTIVE IMPAIRMENTS: decreased ROM, decreased strength, impaired flexibility, impaired UE functional use, and pain.   ACTIVITY LIMITATIONS: carrying, lifting, reach over head, and hygiene/grooming  PARTICIPATION LIMITATIONS: meal prep, cleaning, laundry, community activity, and occupation  PERSONAL FACTORS: Age, Fitness, and Time since onset of injury/illness/exacerbation are also affecting patient's functional outcome.   REHAB POTENTIAL: Good  CLINICAL DECISION MAKING: Evolving/moderate complexity  EVALUATION COMPLEXITY: Moderate   GOALS: Goals reviewed with patient? Yes  SHORT TERM GOALS: Target date: 07/22/2024   Patient will be independent with initial HEP to improve outcomes and carryover.  Baseline: 100% PT assist required for correct completion Goal status: INITIAL  2.  Patient will report 25% improvement in neck pain to improve QOL.  Baseline: 8/10 worst Goal status: INITIAL   LONG TERM GOALS: Target date: 08/19/2024   Patient will be independent with ongoing/advanced HEP for self-management at home.  Baseline: no advanced HEP yet Goal status: INITIAL  3.  Patient will report 50-75% improvement in neck pain to improve QOL.  Baseline: 8/10 worst Goal status: INITIAL   5.  Patient will demonstrate full pain free R elbow flexion and extension ROM to be able to do ADL, housework,  and job unhindered.  Baseline: Refer to above UE ROM table Goal status: INITIAL  6.  Patient will report </= 20% on (MCID = 14%) to demonstrate improved  functional ability.  Baseline: 38% Goal status: INITIAL  7.  Patient will demonstrate 5/5 strength throughout the RUE to be able to do functional tasks without danger of dropping objects or causing self injury   Baseline: see MMT tables above Goal status: INITIAL   PLAN:  PT FREQUENCY: 1-2x/week  PT DURATION: 8 weeks  PLANNED INTERVENTIONS: 97164- PT Re-evaluation, 97110-Therapeutic exercises, 97530- Therapeutic activity, 97112- Neuromuscular re-education, 97535- Self Care, 02859- Manual therapy, [id number]- Aquatic Therapy, H9716- Electrical stimulation (unattended), 97016- Vasopneumatic device, [id number]- Ultrasound, [id number]- Traction (mechanical), [id number]- Ionotophoresis 4mg /ml Dexamethasone , 79439 (1-2 muscles), 20561 (3+ muscles)- Dry Needling, Patient/Family education, Taping, Joint mobilization, Spinal mobilization, Cryotherapy, and Moist heat  PLAN FOR NEXT SESSION: see how ionto and HEP affected symptoms, progress with eccentric strengthening program for elbow/wrist, add nerve glides for radial/median/ulnar nerves, pec stretching;  continue ionto vs KT tape unless adhesive sensitivity prevents.   Heraclio Seidman L Chayna Surratt, PTA 07/01/2024, 11:10 AM

## 2024-07-02 ENCOUNTER — Other Ambulatory Visit: Payer: Self-pay | Admitting: Nurse Practitioner

## 2024-07-02 ENCOUNTER — Other Ambulatory Visit: Payer: Self-pay

## 2024-07-02 ENCOUNTER — Encounter: Payer: Self-pay | Admitting: Hematology

## 2024-07-02 DIAGNOSIS — G620 Drug-induced polyneuropathy: Secondary | ICD-10-CM

## 2024-07-02 DIAGNOSIS — C50911 Malignant neoplasm of unspecified site of right female breast: Secondary | ICD-10-CM

## 2024-07-02 MED ORDER — GABAPENTIN 100 MG PO CAPS: ORAL_CAPSULE | ORAL | 1 refills | Status: AC

## 2024-07-07 ENCOUNTER — Encounter: Payer: Self-pay | Admitting: Physician Assistant

## 2024-07-07 ENCOUNTER — Ambulatory Visit (INDEPENDENT_AMBULATORY_CARE_PROVIDER_SITE_OTHER): Admitting: Physician Assistant

## 2024-07-07 VITALS — BP 137/88

## 2024-07-07 DIAGNOSIS — B079 Viral wart, unspecified: Secondary | ICD-10-CM | POA: Diagnosis not present

## 2024-07-07 DIAGNOSIS — D485 Neoplasm of uncertain behavior of skin: Secondary | ICD-10-CM

## 2024-07-07 NOTE — Patient Instructions (Signed)

## 2024-07-07 NOTE — Progress Notes (Signed)
   New Patient Visit   Subjective  [name] [name] is a 53 y.o. female NEW PATIENT who presents for the following: Mole of top of right thigh. It has a history of bleeding but it has fallen off in the last couple of days. Small amount remains on her thigh. It has been there at least a year. Patient has history of Stage 4 breast cancer.     The following portions of the chart were reviewed this encounter and updated as appropriate: medications, allergies, medical history  Review of Systems:  No other skin or systemic complaints except as noted in HPI or Assessment and Plan.  Objective  Well appearing patient in no apparent distress; mood and affect are within normal limits.   A focused examination was performed of the following areas: Right leg   Relevant exam findings are noted in the Assessment and Plan.  Right ant sup thigh 0.6 cm pink purple brown papule   Assessment & Plan     NEOPLASM OF UNCERTAIN BEHAVIOR OF SKIN Right ant sup thigh Epidermal / dermal shaving  Lesion diameter (cm):  0.6 Informed consent: discussed and consent obtained   Timeout: patient name, date of birth, surgical site, and procedure verified   Procedure prep:  Patient was prepped and draped in usual sterile fashion Prep type:  Isopropyl alcohol Anesthesia: the lesion was anesthetized in a standard fashion   Anesthetic:  1% lidocaine  w/ epinephrine  1-100,000 buffered w/ 8.4% NaHCO3 Instrument used: flexible razor blade   Hemostasis achieved with: pressure, aluminum chloride and electrodesiccation   Outcome: patient tolerated procedure well   Post-procedure details: sterile dressing applied and wound care instructions given   Dressing type: bandage and petrolatum    Specimen 1 - Surgical pathology Differential Diagnosis: MM vs angioma vs ISK vs other   Check Margins: No  Return for pending pathology results.  I, Roseline Hutchinson, CMA, am acting as scribe for Keiri Solano K, PA-C  .   Documentation: I have reviewed the above documentation for accuracy and completeness, and I agree with the above.  Chantelle Verdi K, PA-C

## 2024-07-08 ENCOUNTER — Ambulatory Visit (INDEPENDENT_AMBULATORY_CARE_PROVIDER_SITE_OTHER)

## 2024-07-08 ENCOUNTER — Encounter: Payer: Self-pay | Admitting: Rehabilitation

## 2024-07-08 ENCOUNTER — Ambulatory Visit: Admitting: Rehabilitation

## 2024-07-08 VITALS — BP 130/82 | Ht 70.0 in | Wt 240.0 lb

## 2024-07-08 DIAGNOSIS — C50911 Malignant neoplasm of unspecified site of right female breast: Secondary | ICD-10-CM | POA: Diagnosis not present

## 2024-07-08 DIAGNOSIS — M6281 Muscle weakness (generalized): Secondary | ICD-10-CM | POA: Diagnosis not present

## 2024-07-08 DIAGNOSIS — M7711 Lateral epicondylitis, right elbow: Secondary | ICD-10-CM | POA: Diagnosis not present

## 2024-07-08 DIAGNOSIS — M25521 Pain in right elbow: Secondary | ICD-10-CM

## 2024-07-08 DIAGNOSIS — M25621 Stiffness of right elbow, not elsewhere classified: Secondary | ICD-10-CM

## 2024-07-08 DIAGNOSIS — C7800 Secondary malignant neoplasm of unspecified lung: Secondary | ICD-10-CM | POA: Diagnosis not present

## 2024-07-08 LAB — SURGICAL PATHOLOGY

## 2024-07-08 MED ORDER — CELECOXIB 100 MG PO CAPS: 100.0000 mg | ORAL_CAPSULE | Freq: Two times a day (BID) | ORAL | 1 refills | Status: AC

## 2024-07-08 NOTE — Therapy (Signed)
 OUTPATIENT PHYSICAL THERAPY CERVICAL/UE TREATMENT   Patient Name: [name] [name] MRN: [id number] DOB:1970-10-01, 53 y.o., female Today's Date: 07/08/2024   END OF SESSION:  PT End of Session - 07/08/24 0852     Visit Number 3    Date for Recertification  08/19/24    PT Start Time 0848    PT Stop Time 0938    PT Time Calculation (min) 50 min            Past Medical History:  Diagnosis Date   Allergy    Dust mites, ragweed, grass   Breast cancer (HCC)    Breast cancer (HCC)    CHF (congestive heart failure) (HCC)    Family history of breast cancer    Family history of prostate cancer    GERD (gastroesophageal reflux disease)    during pregnancy    Hypertension    Pneumonia    hx of pneumonia 08/2013    S/P radiation therapy 05/03/2015 through 06/14/2015                                                      Right breast 4680 cGy in 26 sessions, right breast boost 1000 cGy in 5 sessions.  Right supraclavicular/axillary region 4680 cGy with a supplemental PA field to bring the axillary dose up to 4500 cGy in 26 sessions   Past Surgical History:  Procedure Laterality Date   BREAST LUMPECTOMY WITH RADIOACTIVE SEED AND SENTINEL LYMPH NODE BIOPSY Right 03/29/2015   Procedure: BREAST LUMPECTOMY WITH RADIOACTIVE SEED AND SENTINEL LYMPH NODE BIOPSY;  Surgeon: Jina Nephew, MD;  Location: [facility];  Service: General;  Laterality: Right;   BREAST SURGERY     CESAREAN SECTION     CHOLECYSTECTOMY     ESSURE TUBAL LIGATION     PORTACATH PLACEMENT Left 07/21/2014   Procedure: INSERTION PORT-A-CATH;  Surgeon: Jina Nephew, MD;  Location: [facility];  Service: General;  Laterality: Left;   WISDOM TOOTH EXTRACTION     Patient Active Problem List   Diagnosis Date Noted   Abnormal endometrial ultrasound 03/16/2024   Abnormal uterine bleeding (AUB) 03/16/2024   Other fatigue 03/20/2023   Hyperglycemia 03/20/2023   Family history of liver cancer 03/20/2023   Genetic  testing 08/10/2021   Family history of prostate cancer 07/27/2021   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   Seborrheic dermatitis of scalp 06/29/2021   Prediabetes 12/27/2020   Benign essential hypertension 11/12/2019   Dysmenorrhea 12/12/2018   Port-A-Cath in place 08/21/2018   Vitamin D deficiency 07/15/2018   Candidiasis of breast 04/05/2016   Eczema 04/05/2016   Port-A-Cath in place 12/06/2015   Seasonal allergies 10/04/2015   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   Peripheral neuropathy due to chemotherapy 03/15/2015   Breast cancer 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 (HCC) 07/22/2014   Family history of breast cancer 07/22/2014   Lung mas bilaterial 07/22/2014    PCP: Colette Torrence CINDERELLA, MD   REFERRING PROVIDER: Lanny Callander, MD   REFERRING DIAG: C50.911,C78.00 (ICD-10-CM) - 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 [facility])  THERAPY DIAG:  Pain in right elbow  Stiffness of right elbow, not elsewhere classified  Muscle weakness (generalized)  RATIONALE FOR EVALUATION AND TREATMENT: Rehabilitation  ONSET DATE: 2015  NEXT MD VISIT:    SUBJECTIVE:  SUBJECTIVE STATEMENT: Patient reports still having the pain in the R elbow.   She is wearing an epicondylitis strap which she states is irritating.  She is advised to wear it 1 hour on/1 hr off.   She has not been taking her celebrex  as regularly as she can and states she will start doing this.  States she is progressively worse thru the week, but better on the weekend and attributes this to having to do a lot of writing for work.  53 y/o patient referred to PT for RUE pain/weakness/numbness from her oncologist.   Patient was dx'd with R breast CA with mets to lung in 2015 and had lumpectomy, chemo, and radiation.  Radiation was to R anterior lung and she  had axillary lymph node dissection.   Her scans have showed no evidence of disease for the following years since, but she does have R lung scarring.  Oncology note states patient is likely cured and she has stopped all medications.   Patient symptoms are pain in the R dorsal arm from just above her elbow almost to the wrist.  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   States she has never had the neuropathy advance more proximally than her fingertips.  States this pain is different than her finger neuropathy in that it is much sharper and different location.   She states the pain is constant of varying intensity.   Pain started 2 months ago for no apparent reason.   She denies any trauma, falls,  or changes in her activity level.   She is not involved in any sports activities.  Pain is over the R elbow and dorsal forearm.     Straightening her R elbow increases pain.   Full elbow flexion and reaching behind her back or head also increases pain.  Holding the elbow in midrange feels best.  Works at a desk all day and is using her mouse constantly.    Hand dominance: Right  PAIN: Are you having pain? Yes: NPRS scale: 6/10 now; 2/10 best; 8/10 worst Pain location: R dorsal forearm to just above the elbow Pain description: shooting, stabbing Aggravating factors: fully straightening her elbow or fully bending the elbow Relieving factors: keeping her elbow in mid range positions  PERTINENT HISTORY:  Chemo induced peripheral neuropathy, HTN, GERD  PRECAUTIONS: Other: L breast CA lymphedema precautions  RED FLAGS: None  HAND DOMINANCE: Right  WEIGHT BEARING RESTRICTIONS: No  FALLS:  Has patient fallen in last 6 months? 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   RADIATION DOSE REDUCTION: This exam was performed according to the departmental dose-optimization program which includes automated exposure control, adjustment of the mA and/or kV according to patient size and/or use of iterative reconstruction technique.  CONTRAST:  OMNIPAQUE  IOHEXOL  300 MG/ML  SOLN   COMPARISON:  04/03/2023   FINDINGS: CT CHEST FINDINGS   Cardiovascular: Left chest port catheter. Normal heart size. No pericardial effusion.   Mediastinum/Nodes: No enlarged mediastinal, hilar, or axillary lymph nodes. Thyroid  gland, trachea, and esophagus demonstrate no significant findings.   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 Irregular scarring in the suprahilar left upper lobe (series 4, image 51). No pleural effusion or pneumothorax.   Musculoskeletal: Status post lower inner right lumpectomy and right axillary lymph node dissection. No acute osseous findings.   CT ABDOMEN PELVIS FINDINGS   Hepatobiliary: No focal liver abnormality is seen. Hepatic steatosis. Status post cholecystectomy. No biliary dilatation.   Pancreas: Unremarkable. No pancreatic ductal dilatation or surrounding inflammatory changes.   Spleen: Normal in size without significant abnormality.   Adrenals/Urinary Tract: Adrenal glands are unremarkable. Small nonobstructive right renal calculi. No  left-sided calculi, ureteral calculi, or hydronephrosis. Bladder is unremarkable.   Stomach/Bowel: Stomach is within normal limits. Appendix appears normal. No evidence of bowel wall thickening, distention, or inflammatory changes. Pancolonic diverticulosis.   Vascular/Lymphatic: No significant vascular findings are present. No enlarged abdominal or pelvic lymph nodes.   Reproductive: No mass or other abnormality.  Tubal ligation devices.   Other: Small fat containing umbilical hernia.  No ascites.   Musculoskeletal: No acute osseous findings. Gentle S shaped scoliosis of the thoracolumbar spine.   IMPRESSION: 1. Status post lower inner right lumpectomy and right axillary lymph node dissection. 2. 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 Irregular scarring in the suprahilar left upper lobe. 3. No evidence of lymphadenopathy or metastatic disease in the chest, abdomen, or pelvis. 4. Hepatic steatosis. 5. Small nonobstructive right renal calculi. 6. Pancolonic diverticulosis without evidence of acute diverticulitis.     Electronically Signed   By: Marolyn JONETTA Jaksch M.D.   On: 01/30/2024 10:36  PATIENT SURVEYS:  Quick Dash:  QUICK DASH  Please rate your ability do the following activities in the last week by selecting the number below the appropriate response.   Activities Rating  Open a tight or new jar.  2 = Mild difficulty  Do heavy household chores (e.g., wash walls, floors). 5 = Unable  Carry a shopping bag or briefcase 2 = Mild difficulty  Wash your back. 3 = Moderate difficulty  Use a knife to cut food. 3 = Moderate difficulty  Recreational activities in which you take some force or impact through your arm, shoulder or hand (e.g., golf, hammering, tennis, etc.). 2 = Mild difficulty  During the past week, to what extent has your arm, shoulder or hand problem interfered with your normal social activities with family, friends, neighbors or groups?  1 =  Not at all  During the past week, were you limited in your work or other regular daily activities as a result of your arm, shoulder or hand problem? 1 = Not limited at all  Rate the severity of the following symptoms in the last week: Arm, Shoulder, or hand pain. 3 = Moderate  Rate the severity of the following symptoms in the last week: Tingling (pins and needles) in your arm, shoulder or hand. 3 = Moderate  During the past week, how much difficulty have you had sleeping because of the pain in your arm, shoulder or hand?  3 = Moderate difficulty   (A QuickDASH score may not be calculated if there is greater than 1 missing item.)  Quick Dash Disability/Symptom  Score: [(sum of 28  (n) responses/11 (n)] x 25 = 38.63   Minimally Clinically Important Difference (MCID): 15-20 points  (Franchignoni, F. et al. (2013). Minimally clinically important difference of the disabilities of the arm, shoulder, and hand outcome measures (DASH) and its shortened version (Quick DASH). Journal of Orthopaedic & Sports Physical Therapy, 44(1), 30-39)   COGNITION: Overall cognitive status: Within functional limits for tasks assessed  SENSATION: WFL  POSTURE:  No Significant postural limitations  PALPATION: Very TTP just above the R lateral elbow, over the epicondyle, common extensor tendon   CERVICAL ROM: all cervical motions are pain free and do not affect the R elbow pain  Active ROM A/PROM  eval  Flexion 100%  Extension 65%  Right lateral flexion 75%  Left lateral flexion 50%  Right rotation 90%  Left rotation 90%   (Blank rows = not tested)  UPPER EXTREMITY ROM:  Active ROM Right eval Left eval  Shoulder flexion 160; p! 180  Shoulder extension    Shoulder abduction 150; p! 180  Shoulder adduction    Shoulder internal rotation L5; p! T12  Shoulder external rotation At side: 70 deg no pain;   at 90: 50 deg with pain 90 at side and at 90  Elbow flexion Full and equal to LUE, but painful    Elbow extension -15; pain 0  Wrist flexion 70; some pain 75  Wrist extension 90; no increased pain 90  Wrist ulnar deviation    Wrist radial deviation    Wrist pronation Full and equal BUE   Wrist supination Full and equal BUE    (Blank rows = not tested)  UPPER EXTREMITY MMT:  MMT Right eval Left eval  Shoulder flexion 4; limited by elbow p! 5  Shoulder extension    Shoulder abduction 4; limited by elbow p! 5  Shoulder adduction    Shoulder internal rotation 5 5  Shoulder external rotation 4; elbow p! 5  Middle trapezius    Lower trapezius    Elbow flexion 4; p! 5  Elbow extension 4; p! 5  Wrist flexion 5 5  Wrist extension 4+; some pain 5  Wrist ulnar deviation    Wrist radial deviation    Wrist pronation 4+; some p! 5  Wrist supination 4-; lots pain 5  Grip strength Weaker due to pain    (Blank rows = not tested)  CERVICAL SPECIAL TESTS:  Spurling's test: Negative and Distraction test: Negative   ULTT is positive for radial, ulnar, and median nerves Hand grip is good strength, a little painful  Resisted pronation is good a little painful  Resisted supination is very painful and weak  Finger abduction strength is equal BLE  Thoracic outlet tests are difficult to complete due to increased pain with straightening her elbow  Tinel's testing is negative over all R elbow bony prominences and over the ulnar nerve notch (she denies any falls or trauma to R elbow)  FUNCTIONAL TESTS:  NA     TODAY'S TREATMENT:  07/08/24 THERAPEUTIC EXERCISE: To improve strength and endurance.  Demonstration, verbal and tactile cues throughout for technique. UBE L0 x 3'F/3'B  THERAPEUTIC ACTIVITIES: To improve functional performance.  Demonstration, verbal and tactile cues throughout for technique. Wrist curls 2# x 20 Wrist extension w/ eccentric lowering 0# x 20 Radial glides:  Hands crossed and clasped w/ shoulder flexion  Waiters glide  Cobra glide Wrist flexion stretch at wall  x 1' x 3 RUE  MODALITIES: Continuous US  100%  x 1 Mhz x 1.4 w/cm2 x 8' to R common extensor tendon w/ large sound head  MANUAL THERAPY: To promote reduced pain utilizing therapeutic massage and myofascial release. MFR w/ US  gel to common extensor tendon R elbow; KT tape to extensor tendon w/ 1 I strip longitudinally and 1 short I strip across at the CET  07/01/24 THERAPEUTIC EXERCISE: To improve strength, endurance, ROM, and flexibility.  UBE 3 min fwd/ 3 min back Seated L wrist extension 1lb x 20 Seated L forearm pronation/supination x 20 1lb Seated green theraputty squeeze x 12; three finger pinch for fine motor skill x 10 MANUAL THERAPY: To promote normalized muscle tension, improve joint mobility and/or for pain modulation  Massage gun to R wrist extensors close to CET near elbow Ionto Patch (#1 of 6) to R elbow - Dexamethasone  1 mL, 80 mA-min, 4-6 hr wear time  06/24/24 SELF CARE: Provided education on PT POC progression., initial HEP, tennis elbow strap, using ice for pain relief, ionto education handout;  self massage of the lateral epicondyle  Ionto Patch (#1 of 6) to R elbow - Dexamethasone  1 mL, 80 mA-min, 4-6 hr wear time    PATIENT EDUCATION:  Education details: PT eval findings, anticipated POC, and initial HEP  Person educated: Patient Education method: Explanation, Demonstration, Verbal cues, Tactile cues, Handouts, and MedBridgeGO app access provided Education comprehension: verbalized understanding, verbal cues required, tactile cues required, and needs further education  HOME EXERCISE PROGRAM: Access Code: [email or web address] URL: [email or web address] Date: 07/08/2024 Prepared by: Garnette Montclair  Exercises - Standing Wrist Flexion Stretch with Table  - 1 x daily - 7 x weekly - 1 sets - 2 reps - 1 min hold - Radial Nerve Gliding: Waiter's Tip  - 1 x daily - 7 x weekly - 3 sets - 10 reps - Standing Radial Nerve Glide  - 1 x daily - 7 x weekly - 2 sets - 10  reps - 3 sec hold - Radial Nerve Gliding: Waiter's Tip  - 1 x daily - 7 x weekly - 2 sets - 10 reps - 3 sec hold - Radial Nerve Cross-Over  - 1 x daily - 7 x weekly - 1 sets - 10 reps - 3 sec hold - Wrist Extension Stretch at Wall  - 1 x daily - 7 x weekly - 1 sets - 2 reps - 1 min hold - Seated Wrist Flexion with Dumbbell  - 1 x daily - 7 x weekly - 2 sets - 10 reps - Seated Eccentric Wrist Extension  - 1 x daily - 7 x weekly - 3 sets - 10 reps - Seated Wrist Supination Pronation with Can  - 1 x daily - 7 x weekly - 1 sets - 10 reps  ASSESSMENT:  CLINICAL IMPRESSION: Deferred ionto today since patient couldn't really see a difference with it and did KT tape instead.   Added some radial nerve glides to her program as well as extensor stretching.  Encouraged slow eccentric phase w/ wrist exercises.   She is still very painful to palpate around the R elbow above and below at the extensor tendon.   PT remains necessary for pain, weakness, functional deficits.   Continue per POC  JOSALYNN JOHNDROW is a 53 y.o. female who was referred to physical therapy for evaluation and treatment for RUE pain/weakness/numbness  Patient reports onset of R lateral elbow and forearm pain beginning 2 months ago for no apparent reason. Pain is worse with fully bending  or fully extending her R elbow.  She is very tender to palpate over the lateral epicondyle and common extensor tendon.  She has also positive nerve tension tests for the radial, ulnar, and median nerves.  Her dorsal arm pain is also influenced by shoulder rotational movements.  Cervical spine does not seem to be involved.   Differential includes thoracic outlet, peripheral nerve pathology/entrapment, lateral epicondylitis, or a combo of them.  She does have h/o radiation and lymph node removal for R breast CA so brachial plexus pathology could be contributing to her symptoms as well.  Patient has deficits in R elbow ROM, RUE flexibility, RUE strength, with pain  and TTP of the R lateral elbow and dorsal forearm which are interfering with ADLs and are impacting quality of life.  On QuickDash patient scored 38% demonstrating moderate functional limitation of the RUE.  Payzlee will benefit from skilled PT to address above deficits to improve mobility and activity tolerance with decreased pain interference.  OBJECTIVE IMPAIRMENTS: decreased ROM, decreased strength, impaired flexibility, impaired UE functional use, and pain.   ACTIVITY LIMITATIONS: carrying, lifting, reach over head, and hygiene/grooming  PARTICIPATION LIMITATIONS: meal prep, cleaning, laundry, community activity, and occupation  PERSONAL FACTORS: Age, Fitness, and Time since onset of injury/illness/exacerbation are also affecting patient's functional outcome.   REHAB POTENTIAL: Good  CLINICAL DECISION MAKING: Evolving/moderate complexity  EVALUATION COMPLEXITY: Moderate   GOALS: Goals reviewed with patient? Yes  SHORT TERM GOALS: Target date: 07/22/2024   Patient will be independent with initial HEP to improve outcomes and carryover.  Baseline: 100% PT assist required for correct completion Goal status: INITIAL  2.  Patient will report 25% improvement in neck pain to improve QOL.  Baseline: 8/10 worst Goal status: INITIAL   LONG TERM GOALS: Target date: 08/19/2024   Patient will be independent with ongoing/advanced HEP for self-management at home.  Baseline: no advanced HEP yet Goal status: INITIAL  3.  Patient will report 50-75% improvement in neck pain to improve QOL.  Baseline: 8/10 worst Goal status: INITIAL   5.  Patient will demonstrate full pain free R elbow flexion and extension ROM to be able to do ADL, housework, and job unhindered.  Baseline: Refer to above UE ROM table Goal status: INITIAL  6.  Patient will report </= 20% on (MCID = 14%) to demonstrate improved functional ability.  Baseline: 38% Goal status: INITIAL  7.  Patient will demonstrate 5/5  strength throughout the RUE to be able to do functional tasks without danger of dropping objects or causing self injury   Baseline: see MMT tables above Goal status: INITIAL   PLAN:  PT FREQUENCY: 1-2x/week  PT DURATION: 8 weeks  PLANNED INTERVENTIONS: 97164- PT Re-evaluation, 97110-Therapeutic exercises, 97530- Therapeutic activity, 97112- Neuromuscular re-education, 97535- Self Care, [id number]- Manual therapy, [id number]- Aquatic Therapy, H9716- Electrical stimulation (unattended), 97016- Vasopneumatic device, [id number]- Ultrasound, [id number]- Traction (mechanical), [id number]- Ionotophoresis 4mg /ml Dexamethasone , 79439 (1-2 muscles), 20561 (3+ muscles)- Dry Needling, Patient/Family education, Taping, Joint mobilization, Spinal mobilization, Cryotherapy, and Moist heat  PLAN FOR NEXT SESSION: Recommended dixie cup ice massage and mouse pad with wrist buildup.   See how HEP is going, and if MFR/US /tape helped last time;  continue to progress with eccentric strengthening  Ephraim Reichel, PT 07/08/2024, 1:12 PM

## 2024-07-08 NOTE — Progress Notes (Signed)
   Subjective:    Patient ID: [name] [name], female    DOB: 53 y.o., 1970-12-11   MRN: [id number]  Chief Complaint: Right elbow pain  Discussed the use of AI scribe software for clinical note transcription with the patient, who gave verbal consent to proceed.  History of Present Illness [name] [name] is a 53 year old female with past medical history significant for hypertension, breast cancer, and peripheral neuropathy (2/2 paclitaxel ) presenting for evaluation of right elbow pain.  She was previously diagnosed by her oncologist with tennis elbow and referred to physical therapy.  She presents today specifically requesting a refill for Celebrex .  Symptoms began in mid October. 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 Elbow started bothering her about 2 weeks after She is right-hand dominant No other significant injury elbow No other significant change in activity apart from the aforementioned No prior elbow surgery      Objective:   Vitals:   07/08/24 1428  BP: 130/82   Right Elbow ( compared to normal ) Inspection: no swelling, no bony deformity or atrophy of the hypothenar region Palpation: TTP - medial epicondyle, ++ lateral epicondyle - olecranon AROM/PROM: Full flexion, extension, supination, pronation Strength: 5/5 flexion, 5/5 extension, 5/5 pronation, 5/5 supination Special: - pain or laxity with valgus force. + [place]zen.  Equivocal [place].     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  Recommend continue with physical therapy, anti-inflammatory with Celebrex , counterforce bracing, universal wrist brace to minimize wrist extensors.  Also discussed shockwave  therapy inclusion as an adjunct of treatment and began doing a pro bono treatment for her during today's visit, however patient expressed on numerous occasions that this was painful and that she did not like it.  We will not continue with this as a therapy going forward.  We will schedule a follow-up in 6 weeks to reassess.

## 2024-07-08 NOTE — Addendum Note (Signed)
 Addended by: Mavin Dyke A on: 07/08/2024 03:35 PM   Modules accepted: Orders

## 2024-07-10 ENCOUNTER — Ambulatory Visit: Payer: Self-pay | Admitting: Physician Assistant

## 2024-07-13 ENCOUNTER — Ambulatory Visit: Attending: Hematology

## 2024-07-13 DIAGNOSIS — M6281 Muscle weakness (generalized): Secondary | ICD-10-CM | POA: Insufficient documentation

## 2024-07-13 DIAGNOSIS — M25621 Stiffness of right elbow, not elsewhere classified: Secondary | ICD-10-CM | POA: Insufficient documentation

## 2024-07-13 DIAGNOSIS — M25521 Pain in right elbow: Secondary | ICD-10-CM | POA: Insufficient documentation

## 2024-07-13 NOTE — Therapy (Signed)
 OUTPATIENT PHYSICAL THERAPY CERVICAL/UE TREATMENT   Patient Name: [name] [name] MRN: [id number] DOB:[birth year], 53 y.o., female Today's Date: 07/13/2024   END OF SESSION:  PT End of Session - 07/13/24 0808     Visit Number 4    Date for Recertification  08/19/24    PT Start Time 0801    PT Stop Time 0844    PT Time Calculation (min) 43 min             Past Medical History:  Diagnosis Date   Allergy    Dust mites, ragweed, grass   Breast cancer (HCC)    Breast cancer (HCC)    CHF (congestive heart failure) (HCC)    Family history of breast cancer    Family history of prostate cancer    GERD (gastroesophageal reflux disease)    during pregnancy    Hypertension    Pneumonia    hx of pneumonia 08/2013    S/P radiation therapy 05/03/2015 through 06/14/2015                                                      Right breast 4680 cGy in 26 sessions, right breast boost 1000 cGy in 5 sessions.  Right supraclavicular/axillary region 4680 cGy with a supplemental PA field to bring the axillary dose up to 4500 cGy in 26 sessions   Past Surgical History:  Procedure Laterality Date   BREAST LUMPECTOMY WITH RADIOACTIVE SEED AND SENTINEL LYMPH NODE BIOPSY Right 03/29/2015   Procedure: BREAST LUMPECTOMY WITH RADIOACTIVE SEED AND SENTINEL LYMPH NODE BIOPSY;  Surgeon: Jina Nephew, MD;  Location: [facility];  Service: General;  Laterality: Right;   BREAST SURGERY     CESAREAN SECTION     CHOLECYSTECTOMY     ESSURE TUBAL LIGATION     PORTACATH PLACEMENT Left 07/21/2014   Procedure: INSERTION PORT-A-CATH;  Surgeon: Jina Nephew, MD;  Location: [facility];  Service: General;  Laterality: Left;   WISDOM TOOTH EXTRACTION     Patient Active Problem List   Diagnosis Date Noted   Abnormal endometrial ultrasound 03/16/2024   Abnormal uterine bleeding (AUB) 03/16/2024   Other fatigue 03/20/2023   Hyperglycemia 03/20/2023   Family history of liver cancer 03/20/2023   Genetic  testing 08/10/2021   Family history of prostate cancer 07/27/2021   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   Seborrheic dermatitis of scalp 06/29/2021   Prediabetes 12/27/2020   Benign essential hypertension 11/12/2019   Dysmenorrhea 12/12/2018   Port-A-Cath in place 08/21/2018   Vitamin D deficiency 07/15/2018   Candidiasis of breast 04/05/2016   Eczema 04/05/2016   Port-A-Cath in place 12/06/2015   Seasonal allergies 10/04/2015   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   Peripheral neuropathy due to chemotherapy 03/15/2015   Breast cancer 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)  THERAPY DIAG:  Pain in right elbow  Stiffness of right elbow, not elsewhere classified  Muscle weakness (generalized)  RATIONALE FOR EVALUATION AND TREATMENT: Rehabilitation  ONSET DATE: 2015  NEXT MD VISIT:    SUBJECTIVE:  SUBJECTIVE STATEMENT: Pt reports doctor wants to try wrist strap vs elbow strap  53 y/o patient referred to PT for RUE pain/weakness/numbness from her oncologist.   Patient was dx'd with R breast CA with mets to lung in 2015 and had lumpectomy, chemo, and radiation.  Radiation was to R anterior lung and she had axillary lymph node dissection.   Her scans have showed no evidence of disease for the following years since, but she does have R lung scarring.  Oncology note states patient is likely cured and she has stopped all medications.   Patient symptoms are pain in the R dorsal arm from just above her elbow almost to the wrist.  She does have a dx of  chemo induced peripheral neuropathy which only affects her fingertips and feet with some numb/paresthetic sensations.   States she has never had the neuropathy advance more proximally than her fingertips.  States this pain is different than her finger neuropathy in that it is much sharper and different location.   She states the pain is constant of varying intensity.   Pain started 2 months ago for no apparent reason.   She denies any trauma, falls,  or changes in her activity level.   She is not involved in any sports activities.  Pain is over the R elbow and dorsal forearm.     Straightening her R elbow increases pain.   Full elbow flexion and reaching behind her back or head also increases pain.  Holding the elbow in midrange feels best.  Works at a desk all day and is using her mouse constantly.    Hand dominance: Right  PAIN: Are you having pain? Yes: NPRS scale: 7/10 now; 2/10 best; 8/10 worst Pain location: R dorsal forearm to just above the elbow Pain description: shooting, stabbing Aggravating factors: fully straightening her elbow or fully bending the elbow Relieving factors: keeping her elbow in mid range positions  PERTINENT HISTORY:  Chemo induced peripheral neuropathy, HTN, GERD  PRECAUTIONS: Other: L breast CA lymphedema precautions  RED FLAGS: None  HAND DOMINANCE: Right  WEIGHT BEARING RESTRICTIONS: No  FALLS:  Has patient fallen in last 6 months? 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   RADIATION DOSE REDUCTION: This exam was performed according to the departmental dose-optimization program which includes automated exposure control, adjustment of the mA and/or kV according to patient size and/or use of iterative reconstruction technique.   CONTRAST:  OMNIPAQUE  IOHEXOL  300 MG/ML  SOLN   COMPARISON:  04/03/2023   FINDINGS: CT CHEST FINDINGS   Cardiovascular: Left chest port catheter. Normal heart size. No pericardial effusion.   Mediastinum/Nodes: No enlarged mediastinal, hilar, or axillary lymph nodes. Thyroid  gland, trachea, and esophagus demonstrate no significant findings.   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 Irregular scarring in the suprahilar left upper  lobe (series 4, image 51). No pleural effusion or pneumothorax.   Musculoskeletal: Status post lower inner right lumpectomy and right axillary lymph node dissection. No acute osseous findings.   CT ABDOMEN PELVIS FINDINGS   Hepatobiliary: No focal liver abnormality is seen. Hepatic steatosis. Status post cholecystectomy. No biliary dilatation.   Pancreas: Unremarkable. No pancreatic ductal dilatation or surrounding inflammatory changes.   Spleen: Normal in size without significant abnormality.   Adrenals/Urinary Tract: Adrenal glands are unremarkable. Small nonobstructive right renal calculi. No left-sided calculi, ureteral calculi, or hydronephrosis. Bladder is unremarkable.   Stomach/Bowel: Stomach is within normal limits. Appendix appears normal. No evidence of bowel wall thickening, distention, or inflammatory changes. Pancolonic diverticulosis.   Vascular/Lymphatic: No significant vascular findings are present. No enlarged  abdominal or pelvic lymph nodes.   Reproductive: No mass or other abnormality.  Tubal ligation devices.   Other: Small fat containing umbilical hernia.  No ascites.   Musculoskeletal: No acute osseous findings. Gentle S shaped scoliosis of the thoracolumbar spine.   IMPRESSION: 1. Status post lower inner right lumpectomy and right axillary lymph node dissection. 2. 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 Irregular scarring in the suprahilar left upper lobe. 3. No evidence of lymphadenopathy or metastatic disease in the chest, abdomen, or pelvis. 4. Hepatic steatosis. 5. Small nonobstructive right renal calculi. 6. Pancolonic diverticulosis without evidence of acute diverticulitis.     Electronically Signed   By: Marolyn JONETTA Jaksch M.D.   On: 01/30/2024 10:36  PATIENT SURVEYS:  Quick Dash:  QUICK DASH  Please rate your ability do the following activities in the last week by selecting the number below the appropriate response.   Activities Rating  Open a tight or new jar.  2 = Mild difficulty  Do heavy household chores (e.g., wash walls, floors). 5 = Unable  Carry a shopping bag or briefcase 2 = Mild difficulty  Wash your back. 3 = Moderate difficulty  Use a knife to cut food. 3 = Moderate difficulty  Recreational activities in which you take some force or impact through your arm, shoulder or hand (e.g., golf, hammering, tennis, etc.). 2 = Mild difficulty  During the past week, to what extent has your arm, shoulder or hand problem interfered with your normal social activities with family, friends, neighbors or groups?  1 = Not at all  During the past week, were you limited in your work or other regular daily activities as a result of your arm, shoulder or hand problem? 1 = Not limited at all  Rate the severity of the following symptoms in the last week: Arm, Shoulder, or hand pain. 3 = Moderate  Rate the severity of the following symptoms in the last week:  Tingling (pins and needles) in your arm, shoulder or hand. 3 = Moderate  During the past week, how much difficulty have you had sleeping because of the pain in your arm, shoulder or hand?  3 = Moderate difficulty   (A QuickDASH score may not be calculated if there is greater than 1 missing item.)  Quick Dash Disability/Symptom Score: [(sum of 28  (n) responses/11 (n)] x 25 = 38.63   Minimally Clinically Important Difference (MCID): 15-20 points  (Franchignoni, F. et al. (2013). Minimally clinically important difference of the disabilities of the arm, shoulder, and hand outcome measures (DASH) and its shortened version (Quick DASH). Journal of Orthopaedic & Sports Physical Therapy, 44(1), 30-39)   COGNITION: Overall cognitive status: Within functional limits for tasks assessed  SENSATION: WFL  POSTURE:  No Significant postural limitations  PALPATION: Very TTP just above the R lateral elbow, over the epicondyle, common extensor tendon   CERVICAL ROM: all cervical motions are pain free and do not affect the R elbow pain  Active ROM A/PROM  eval  Flexion 100%  Extension 65%  Right lateral flexion 75%  Left lateral flexion 50%  Right rotation 90%  Left rotation 90%   (Blank rows = not tested)  UPPER EXTREMITY ROM:  Active ROM Right eval Left eval  Shoulder flexion 160; p! 180  Shoulder extension    Shoulder abduction 150; p! 180  Shoulder adduction    Shoulder internal rotation L5; p! T12  Shoulder external rotation At side: 70 deg no pain;   at 90: 50 deg with pain 90 at side and at 90  Elbow flexion Full and equal to LUE, but painful   Elbow extension -15; pain 0  Wrist flexion 70; some pain 75  Wrist extension 90; no increased pain 90  Wrist ulnar deviation    Wrist radial deviation    Wrist pronation Full and equal BUE   Wrist supination Full and equal BUE    (Blank rows = not tested)  UPPER EXTREMITY MMT:  MMT Right eval Left eval  Shoulder flexion 4; limited  by elbow p! 5  Shoulder extension    Shoulder abduction 4; limited by elbow p! 5  Shoulder adduction    Shoulder internal rotation 5 5  Shoulder external rotation 4; elbow p! 5  Middle trapezius    Lower trapezius    Elbow flexion 4; p! 5  Elbow extension 4; p! 5  Wrist flexion 5 5  Wrist extension 4+; some pain 5  Wrist ulnar deviation    Wrist radial deviation    Wrist pronation 4+; some p! 5  Wrist supination 4-; lots pain 5  Grip strength Weaker due to pain    (Blank rows = not tested)  CERVICAL SPECIAL TESTS:  Spurling's test: Negative and Distraction test: Negative   ULTT is positive for radial, ulnar, and median nerves Hand grip is good strength, a little painful  Resisted pronation is good a little painful  Resisted supination is very painful and weak  Finger abduction strength is equal BLE  Thoracic outlet tests are difficult to complete due to increased pain with straightening her elbow  Tinel's testing is negative over all R elbow bony prominences and over the ulnar nerve notch (she denies any falls or trauma to R elbow)  FUNCTIONAL TESTS:  NA     TODAY'S TREATMENT:  07/13/24 THERAPEUTIC EXERCISE: To improve strength and endurance.  Demonstration, verbal and tactile cues throughout for technique. UBE L1 x 3'F/3'B Wrist curls 2lb 2 x 10 Pronation/supination 2lb 2  x 10 Wrist extension 2lb 2 x 10 Rows 15lb low grip 2 x 10  MODALITIES: Continuous US  100% x 1 Mhz x 1.2 w/cm2 x 8' to R common extensor tendon w/ medium sized sound head  CP to R CET and wrist extensors x 5 min  07/08/24 THERAPEUTIC EXERCISE: To improve strength and endurance.  Demonstration, verbal and tactile cues throughout for technique. UBE L0 x 3'F/3'B  THERAPEUTIC ACTIVITIES: To improve functional performance.  Demonstration, verbal and tactile cues throughout for technique. Wrist curls 2# x 20 Wrist extension w/ eccentric lowering 0# x 20 Radial glides:  Hands crossed and clasped w/  shoulder flexion  Waiters glide  Cobra glide Wrist flexion stretch at wall  x 1' x 3 RUE  MODALITIES: Continuous US  100% x 1 Mhz x 1.4 w/cm2 x 8' to R common extensor tendon w/ large sound head  MANUAL THERAPY: To promote reduced pain utilizing therapeutic massage and myofascial release. MFR w/ US  gel to common extensor tendon R elbow; KT tape to extensor tendon w/ 1 I strip longitudinally and 1 short I strip across at the CET  07/01/24 THERAPEUTIC EXERCISE: To improve strength, endurance, ROM, and flexibility.  UBE 3 min fwd/ 3 min back Seated L wrist extension 1lb x 20 Seated L forearm pronation/supination x 20 1lb Seated green theraputty squeeze x 12; three finger pinch for fine motor skill x 10 MANUAL THERAPY: To promote normalized muscle tension, improve joint mobility and/or for pain modulation  Massage gun to R wrist extensors close to CET near elbow Ionto Patch (#1 of 6) to R elbow - Dexamethasone  1 mL, 80 mA-min, 4-6 hr wear time  06/24/24 SELF CARE: Provided education on PT POC progression., initial HEP, tennis elbow strap, using ice for pain relief, ionto education handout;  self massage of the lateral epicondyle  Ionto Patch (#1 of 6) to R elbow - Dexamethasone  1 mL, 80 mA-min, 4-6 hr wear time    PATIENT EDUCATION:  Education details: PT eval findings, anticipated POC, and initial HEP  Person educated: Patient Education method: Explanation, Demonstration, Verbal cues, Tactile cues, Handouts, and [email or web address] app access provided Education comprehension: verbalized understanding, verbal cues required, tactile cues required, and needs further education  HOME EXERCISE PROGRAM: Access Code: [email or web address] URL: [email or web address] Date: 07/08/2024 Prepared by: Garnette Montclair  Exercises - Standing Wrist Flexion Stretch with Table  - 1 x daily - 7 x weekly - 1 sets - 2 reps - 1 min hold - Radial Nerve Gliding: Waiter's Tip  - 1 x daily - 7 x weekly - 3 sets -  10 reps - Standing Radial Nerve Glide  - 1 x daily - 7 x weekly - 2 sets - 10 reps - 3 sec hold - Radial Nerve Gliding: Waiter's Tip  - 1 x daily - 7 x weekly - 2 sets - 10 reps - 3 sec hold - Radial Nerve Cross-Over  - 1 x daily - 7 x weekly - 1 sets - 10 reps - 3 sec hold - Wrist Extension Stretch at Wall  - 1 x daily - 7 x weekly - 1 sets - 2 reps - 1 min hold - Seated Wrist Flexion with Dumbbell  - 1 x daily - 7 x weekly - 2 sets - 10 reps - Seated Eccentric Wrist Extension  - 1 x daily - 7 x weekly - 3 sets - 10 reps - Seated Wrist Supination Pronation with Can  - 1 x daily - 7 x weekly - 1 sets - 10 reps  ASSESSMENT:  CLINICAL IMPRESSION: Trialed US  again today for inflammation with smaller head. Continued wrist strengthening and added postural strengthening as well. Concluded session with CP to address inflammation, post session.  [name] [name] is a 53 y.o. female who was referred to physical therapy for evaluation and treatment for RUE pain/weakness/numbness  Patient reports onset of R lateral elbow and forearm pain beginning 2 months ago for no apparent reason. Pain is worse with fully bending or fully extending her R elbow.  She is very tender to palpate over the lateral epicondyle and common extensor tendon.  She has also positive nerve tension tests for the radial, ulnar, and median nerves.  Her  dorsal arm pain is also influenced by shoulder rotational movements.  Cervical spine does not seem to be involved.   Differential includes thoracic outlet, peripheral nerve pathology/entrapment, lateral epicondylitis, or a combo of them.  She does have h/o radiation and lymph node removal for R breast CA so brachial plexus pathology could be contributing to her symptoms as well.  Patient has deficits in R elbow ROM, RUE flexibility, RUE strength, with pain and TTP of the R lateral elbow and dorsal forearm which are interfering with ADLs and are impacting quality of life.  On QuickDash patient  scored 38% demonstrating moderate functional limitation of the RUE.  [name] [name] will benefit from skilled PT to address above deficits to improve mobility and activity tolerance with decreased pain interference.  OBJECTIVE IMPAIRMENTS: decreased ROM, decreased strength, impaired flexibility, impaired UE functional use, and pain.   ACTIVITY LIMITATIONS: carrying, lifting, reach over head, and hygiene/grooming  PARTICIPATION LIMITATIONS: meal prep, cleaning, laundry, community activity, and occupation  PERSONAL FACTORS: Age, Fitness, and Time since onset of injury/illness/exacerbation are also affecting patient's functional outcome.   REHAB POTENTIAL: Good  CLINICAL DECISION MAKING: Evolving/moderate complexity  EVALUATION COMPLEXITY: Moderate   GOALS: Goals reviewed with patient? Yes  SHORT TERM GOALS: Target date: 07/22/2024   Patient will be independent with initial HEP to improve outcomes and carryover.  Baseline: 100% PT assist required for correct completion Goal status: MET- 07/13/24  2.  Patient will report 25% improvement in neck pain to improve QOL.  Baseline: 8/10 worst Goal status: INITIAL   LONG TERM GOALS: Target date: 08/19/2024   Patient will be independent with ongoing/advanced HEP for self-management at home.  Baseline: no advanced HEP yet Goal status: INITIAL  3.  Patient will report 50-75% improvement in neck pain to improve QOL.  Baseline: 8/10 worst Goal status: INITIAL   5.  Patient will demonstrate full pain free R elbow flexion and extension ROM to be able to do ADL, housework, and job unhindered.  Baseline: Refer to above UE ROM table Goal status: INITIAL  6.  Patient will report </= 20% on (MCID = 14%) to demonstrate improved functional ability.  Baseline: 38% Goal status: INITIAL  7.  Patient will demonstrate 5/5 strength throughout the RUE to be able to do functional tasks without danger of dropping objects or causing self injury   Baseline:  see MMT tables above Goal status: INITIAL   PLAN:  PT FREQUENCY: 1-2x/week  PT DURATION: 8 weeks  PLANNED INTERVENTIONS: 97164- PT Re-evaluation, 97110-Therapeutic exercises, 97530- Therapeutic activity, 97112- Neuromuscular re-education, 97535- Self Care, [id number]- Manual therapy, [id number]- Aquatic Therapy, H9716- Electrical stimulation (unattended), 97016- Vasopneumatic device, [id number]- Ultrasound, [id number]- Traction (mechanical), [id number]- Ionotophoresis 4mg /ml Dexamethasone , 79439 (1-2 muscles), 20561 (3+ muscles)- Dry Needling, Patient/Family education, Taping, Joint mobilization, Spinal mobilization, Cryotherapy, and Moist heat  PLAN FOR NEXT SESSION: Recommended dixie cup ice massage and mouse pad with wrist buildup.   See how HEP is going, and if MFR/US /tape helped last time;  continue to progress with eccentric strengthening  Sol LITTIE Gaskins, PTA 07/13/2024, 8:51 AM

## 2024-07-16 ENCOUNTER — Encounter: Payer: Self-pay | Admitting: Rehabilitation

## 2024-07-16 ENCOUNTER — Ambulatory Visit: Admitting: Rehabilitation

## 2024-07-16 DIAGNOSIS — M6281 Muscle weakness (generalized): Secondary | ICD-10-CM

## 2024-07-16 DIAGNOSIS — M25621 Stiffness of right elbow, not elsewhere classified: Secondary | ICD-10-CM | POA: Diagnosis not present

## 2024-07-16 DIAGNOSIS — M25521 Pain in right elbow: Secondary | ICD-10-CM | POA: Diagnosis not present

## 2024-07-16 NOTE — Therapy (Unsigned)
 OUTPATIENT PHYSICAL THERAPY CERVICAL/UE TREATMENT   Patient Name: [name] [name] MRN: [id number] DOB:1971/04/22, 53 y.o., female Today's Date: 07/17/2024   END OF SESSION:  PT End of Session - 07/16/24 0852     Visit Number 5    Date for Recertification  08/19/24    PT Start Time 0801    PT Stop Time 0844    PT Time Calculation (min) 43 min             Past Medical History:  Diagnosis Date   Allergy    Dust mites, ragweed, grass   Breast cancer (HCC)    Breast cancer (HCC)    CHF (congestive heart failure) (HCC)    Family history of breast cancer    Family history of prostate cancer    GERD (gastroesophageal reflux disease)    during pregnancy    Hypertension    Pneumonia    hx of pneumonia 08/2013    S/P radiation therapy 05/03/2015 through 06/14/2015                                                      Right breast 4680 cGy in 26 sessions, right breast boost 1000 cGy in 5 sessions.  Right supraclavicular/axillary region 4680 cGy with a supplemental PA field to bring the axillary dose up to 4500 cGy in 26 sessions   Past Surgical History:  Procedure Laterality Date   BREAST LUMPECTOMY WITH RADIOACTIVE SEED AND SENTINEL LYMPH NODE BIOPSY Right 03/29/2015   Procedure: BREAST LUMPECTOMY WITH RADIOACTIVE SEED AND SENTINEL LYMPH NODE BIOPSY;  Surgeon: Jina Nephew, MD;  Location: [facility];  Service: General;  Laterality: Right;   BREAST SURGERY     CESAREAN SECTION     CHOLECYSTECTOMY     ESSURE TUBAL LIGATION     PORTACATH PLACEMENT Left 07/21/2014   Procedure: INSERTION PORT-A-CATH;  Surgeon: Jina Nephew, MD;  Location: [facility];  Service: General;  Laterality: Left;   WISDOM TOOTH EXTRACTION     Patient Active Problem List   Diagnosis Date Noted   Abnormal endometrial ultrasound 03/16/2024   Abnormal uterine bleeding (AUB) 03/16/2024   Other fatigue 03/20/2023   Hyperglycemia 03/20/2023   Family history of liver cancer 03/20/2023   Genetic  testing 08/10/2021   Family history of prostate cancer 07/27/2021   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   Seborrheic dermatitis of scalp 06/29/2021   Prediabetes 12/27/2020   Benign essential hypertension 11/12/2019   Dysmenorrhea 12/12/2018   Port-A-Cath in place 08/21/2018   Vitamin D deficiency 07/15/2018   Candidiasis of breast 04/05/2016   Eczema 04/05/2016   Port-A-Cath in place 12/06/2015   Seasonal allergies 10/04/2015   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   Peripheral neuropathy due to chemotherapy 03/15/2015   Breast cancer 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   Rates pain in the RUE at 3/10 which is somewhat better  53 y/o patient referred to PT for RUE pain/weakness/numbness from her oncologist.   Patient was dx'd with R breast CA with mets to lung in 2015 and had lumpectomy, chemo, and radiation.  Radiation was to R anterior lung and she had axillary lymph node dissection.   Her scans have showed no evidence of disease for the following years since, but she does have R lung scarring.  Oncology note states patient is likely cured and she has stopped all medications.   Patient symptoms are pain in the R dorsal arm from just above her elbow  almost to the wrist.  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   States she has never had the neuropathy advance more proximally than her fingertips.  States this pain is different than her finger neuropathy in that it is much sharper and different location.   She states the pain is constant of varying intensity.   Pain started 2 months ago for no apparent reason.   She denies any trauma, falls,  or changes in her activity level.   She is not involved in any sports activities.  Pain is over the R elbow and dorsal forearm.     Straightening her R elbow increases pain.   Full elbow flexion and reaching behind her back or head also increases pain.  Holding the elbow in midrange feels best.  Works at a desk all day and is using her mouse constantly.    Hand dominance: Right  PAIN: Are you having pain? Yes: NPRS scale: 7/10 now; 2/10 best; 8/10 worst Pain location: R dorsal forearm to just above the elbow Pain description: shooting, stabbing Aggravating factors: fully straightening her elbow or fully bending the elbow Relieving factors: keeping her elbow in mid range positions  PERTINENT HISTORY:  Chemo induced peripheral neuropathy, HTN, GERD  PRECAUTIONS: Other: L breast CA lymphedema precautions  RED FLAGS: None  HAND DOMINANCE: Right  WEIGHT BEARING RESTRICTIONS: No  FALLS:  Has patient fallen in last 6 months? 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   RADIATION DOSE REDUCTION: This exam was performed according to the departmental dose-optimization program which includes automated exposure control, adjustment of the mA and/or kV according to patient size and/or use of iterative reconstruction technique.   CONTRAST:  OMNIPAQUE  IOHEXOL  300 MG/ML  SOLN   COMPARISON:  04/03/2023   FINDINGS: CT CHEST FINDINGS   Cardiovascular: Left chest port catheter. Normal heart size. No pericardial effusion.   Mediastinum/Nodes: No enlarged mediastinal, hilar, or axillary lymph nodes. Thyroid  gland, trachea, and esophagus demonstrate no significant findings.   Lungs/Pleura: Subpleural radiation fibrosis of the anterior  right lung. Irregular scarring in the suprahilar left upper lobe (series 4, image 51). No pleural effusion or pneumothorax.   Musculoskeletal: Status post lower inner right lumpectomy and right axillary lymph node dissection. No acute osseous findings.   CT ABDOMEN PELVIS FINDINGS   Hepatobiliary: No focal liver abnormality is seen. Hepatic steatosis. Status post cholecystectomy. No biliary dilatation.   Pancreas: Unremarkable. No pancreatic ductal dilatation or surrounding inflammatory changes.   Spleen: Normal in size without significant abnormality.   Adrenals/Urinary Tract: Adrenal glands are unremarkable. Small nonobstructive right renal calculi. No left-sided calculi, ureteral calculi, or hydronephrosis. Bladder is unremarkable.   Stomach/Bowel: Stomach is within normal limits. Appendix appears normal. No evidence of bowel wall thickening, distention, or inflammatory changes. Pancolonic diverticulosis.   Vascular/Lymphatic: No significant  vascular findings are present. No enlarged abdominal or pelvic lymph nodes.   Reproductive: No mass or other abnormality.  Tubal ligation devices.   Other: Small fat containing umbilical hernia.  No ascites.   Musculoskeletal: No acute osseous findings. Gentle S shaped scoliosis of the thoracolumbar spine.   IMPRESSION: 1. Status post lower inner right lumpectomy and right axillary lymph node dissection. 2. 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 Irregular scarring in the suprahilar left upper lobe. 3. No evidence of lymphadenopathy or metastatic disease in the chest, abdomen, or pelvis. 4. Hepatic steatosis. 5. Small nonobstructive right renal calculi. 6. Pancolonic diverticulosis without evidence of acute diverticulitis.     Electronically Signed   By: Marolyn JONETTA Jaksch M.D.   On: 01/30/2024 10:36  PATIENT SURVEYS:  Quick Dash:  QUICK DASH  Please rate your ability do the following activities in the last week by selecting the number below the appropriate response.   Activities Rating  Open a tight or new jar.  2 = Mild difficulty  Do heavy household chores (e.g., wash walls, floors). 5 = Unable  Carry a shopping bag or briefcase 2 = Mild difficulty  Wash your back. 3 = Moderate difficulty  Use a knife to cut food. 3 = Moderate difficulty  Recreational activities in which you take some force or impact through your arm, shoulder or hand (e.g., golf, hammering, tennis, etc.). 2 = Mild difficulty  During the past week, to what extent has your arm, shoulder or hand problem interfered with your normal social activities with family, friends, neighbors or groups?  1 = Not at all  During the past week, were you limited in your work or other regular daily activities as a result of your arm, shoulder or hand problem? 1 = Not limited at all  Rate the severity of the following symptoms in the last week: Arm, Shoulder, or hand pain. 3 = Moderate  Rate the severity of the  following symptoms in the last week: Tingling (pins and needles) in your arm, shoulder or hand. 3 = Moderate  During the past week, how much difficulty have you had sleeping because of the pain in your arm, shoulder or hand?  3 = Moderate difficulty   (A QuickDASH score may not be calculated if there is greater than 1 missing item.)  Quick Dash Disability/Symptom Score: [(sum of 28  (n) responses/11 (n)] x 25 = 38.63   Minimally Clinically Important Difference (MCID): 15-20 points  (Franchignoni, F. et al. (2013). Minimally clinically important difference of the disabilities of the arm, shoulder, and hand outcome measures (DASH) and its shortened version (Quick DASH). Journal of Orthopaedic & Sports Physical Therapy, 44(1), 30-39)   COGNITION: Overall  cognitive status: Within functional limits for tasks assessed  SENSATION: WFL  POSTURE:  No Significant postural limitations  PALPATION: Very TTP just above the R lateral elbow, over the epicondyle, common extensor tendon   CERVICAL ROM: all cervical motions are pain free and do not affect the R elbow pain  Active ROM A/PROM  eval  Flexion 100%  Extension 65%  Right lateral flexion 75%  Left lateral flexion 50%  Right rotation 90%  Left rotation 90%   (Blank rows = not tested)  UPPER EXTREMITY ROM:  Active ROM Right eval Left eval  Shoulder flexion 160; p! 180  Shoulder extension    Shoulder abduction 150; p! 180  Shoulder adduction    Shoulder internal rotation L5; p! T12  Shoulder external rotation At side: 70 deg no pain;   at 90: 50 deg with pain 90 at side and at 90  Elbow flexion Full and equal to LUE, but painful   Elbow extension -15; pain 0  Wrist flexion 70; some pain 75  Wrist extension 90; no increased pain 90  Wrist ulnar deviation    Wrist radial deviation    Wrist pronation Full and equal BUE   Wrist supination Full and equal BUE    (Blank rows = not tested)  UPPER EXTREMITY MMT:  MMT Right eval  Left eval  Shoulder flexion 4; limited by elbow p! 5  Shoulder extension    Shoulder abduction 4; limited by elbow p! 5  Shoulder adduction    Shoulder internal rotation 5 5  Shoulder external rotation 4; elbow p! 5  Middle trapezius    Lower trapezius    Elbow flexion 4; p! 5  Elbow extension 4; p! 5  Wrist flexion 5 5  Wrist extension 4+; some pain 5  Wrist ulnar deviation    Wrist radial deviation    Wrist pronation 4+; some p! 5  Wrist supination 4-; lots pain 5  Grip strength Weaker due to pain    (Blank rows = not tested)  CERVICAL SPECIAL TESTS:  Spurling's test: Negative and Distraction test: Negative   ULTT is positive for radial, ulnar, and median nerves Hand grip is good strength, a little painful  Resisted pronation is good a little painful  Resisted supination is very painful and weak  Finger abduction strength is equal BLE  Thoracic outlet tests are difficult to complete due to increased pain with straightening her elbow  Tinel's testing is negative over all R elbow bony prominences and over the ulnar nerve notch (she denies any falls or trauma to R elbow)  FUNCTIONAL TESTS:  NA     TODAY'S TREATMENT:  07/16/24 THERAPEUTIC EXERCISE: To improve strength and endurance.  Demonstration, verbal and tactile cues throughout for technique. UBE L0 x 3'F/3'B  THERAPEUTIC ACTIVITIES: To improve functional performance.  Demonstration, verbal and tactile cues throughout for technique. Wrist curl 2# x 20 RUE Wrist extension 2# w/ slow eccentric phase x 20 RUE Wrist pronation/supination 2# x 20 RUE Bicep curl in supination 2# x 20 RUE Wrist flexion stretch x 1' x 2 RUE Rowing low grip 15# x 2/10 BUE  MODALITIES: Continuous US  100% x 1 Mhz x 1.4 w/cm2 x 8' to R common extensor tendon w/ medium sound head  MANUAL THERAPY: To promote reduced pain utilizing kinesiotaping. KT tape w/ star pattern for space correction to common extensor tendon RUE  07/13/24 THERAPEUTIC  EXERCISE: To improve strength and endurance.  Demonstration, verbal and tactile cues throughout for  technique. UBE L1 x 3'F/3'B Wrist curls 2lb 2 x 10 Pronation/supination 2lb 2  x 10 Wrist extension 2lb 2 x 10 Rows 15lb low grip 2 x 10  MODALITIES: Continuous US  100% x 1 Mhz x 1.2 w/cm2 x 8' to R common extensor tendon w/ medium sized sound head  CP to R CET and wrist extensors x 5 min  07/08/24 THERAPEUTIC EXERCISE: To improve strength and endurance.  Demonstration, verbal and tactile cues throughout for technique. UBE L0 x 3'F/3'B  THERAPEUTIC ACTIVITIES: To improve functional performance.  Demonstration, verbal and tactile cues throughout for technique. Wrist curls 2# x 20 Wrist extension w/ eccentric lowering 0# x 20 Radial glides:  Hands crossed and clasped w/ shoulder flexion  Waiters glide  Cobra glide Wrist flexion stretch at wall x 1' x 3 RUE  MODALITIES: Continuous US  100% x 1 Mhz x 1.4 w/cm2 x 8' to R common extensor tendon w/ large sound head  MANUAL THERAPY: To promote reduced pain utilizing therapeutic massage and myofascial release. MFR w/ US  gel to common extensor tendon R elbow; KT tape to extensor tendon w/ 1 I strip longitudinally and 1 short I strip across at the CET  07/01/24 THERAPEUTIC EXERCISE: To improve strength, endurance, ROM, and flexibility.  UBE 3 min fwd/ 3 min back Seated L wrist extension 1lb x 20 Seated L forearm pronation/supination x 20 1lb Seated green theraputty squeeze x 12; three finger pinch for fine motor skill x 10 MANUAL THERAPY: To promote normalized muscle tension, improve joint mobility and/or for pain modulation  Massage gun to R wrist extensors close to CET near elbow Ionto Patch (#1 of 6) to R elbow - Dexamethasone  1 mL, 80 mA-min, 4-6 hr wear time  06/24/24 SELF CARE: Provided education on PT POC progression., initial HEP, tennis elbow strap, using ice for pain relief, ionto education handout;  self massage of the lateral  epicondyle  Ionto Patch (#1 of 6) to R elbow - Dexamethasone  1 mL, 80 mA-min, 4-6 hr wear time    PATIENT EDUCATION:  Education details: HEP review and HEP update  Person educated: Patient Education method: Explanation, Demonstration, Verbal cues, Tactile cues, Handouts, and MedBridgeGO app access provided Education comprehension: verbalized understanding, verbal cues required, tactile cues required, and needs further education  HOME EXERCISE PROGRAM: Access Code: [email or web address] URL: [email or web address] Date: 07/16/2024 Prepared by: Garnette Montclair  Exercises - Radial Nerve Gliding: Waiter's Tip  - 1 x daily - 7 x weekly - 3 sets - 10 reps - Standing Radial Nerve Glide  - 1 x daily - 7 x weekly - 2 sets - 10 reps - 3 sec hold - Radial Nerve Gliding: Waiter's Tip  - 1 x daily - 7 x weekly - 2 sets - 10 reps - 3 sec hold - Radial Nerve Cross-Over  - 1 x daily - 7 x weekly - 1 sets - 10 reps - 3 sec hold - Wrist Extension Stretch at Wall  - 1 x daily - 7 x weekly - 1 sets - 2 reps - 1 min hold - Seated Wrist Flexion with Dumbbell  - 1 x daily - 7 x weekly - 2 sets - 10 reps - Seated Eccentric Wrist Extension  - 1 x daily - 7 x weekly - 3 sets - 10 reps - Seated Wrist Supination Pronation with Can  - 1 x daily - 7 x weekly - 1 sets - 10 reps - Seated Single Arm Elbow Flexion with Dumbbell  -  1 x daily - 7 x weekly - 3 sets - 10 reps - Seated Wrist Flexion Stretch  - 1 x daily - 7 x weekly - 1 sets - 2 reps - 1 min hold  ASSESSMENT:  CLINICAL IMPRESSION: Patient is starting to see some improvement which will hopefully continue.  However, her painful symptoms vary greatly from day to day for no apparent reason.   Resting over the holiday didn't seem to help her pain, and she is working regularly this week, but feels ok today.   She is doing her home exercises daily and we recommend she continue with this and doing wrist extensor stretching hourly throughout the day as able.   Discussed using wrist flexors more and holding the R wrist in a slightly flexed position when she is lifting or carrying bags, etc rather than overusing her extensors to hold things or positioning in wrist extension for carrying/lifting.  She has stopped wearing the tennis elbow strap as Dr Charles recommended and is using a soft wrist brace (no cockup or metal in it).  Gripping activities and wrist strengthening is still painful, but we continue to recommend light weight eccentric strengthening as this is evidenced based treatment.   PT remains necessary for pain, strength, function of the R elbow and wrist.   Continue per POC  [name] [name] is a 53 y.o. female who was referred to physical therapy for evaluation and treatment for RUE pain/weakness/numbness  Patient reports onset of R lateral elbow and forearm pain beginning 2 months ago for no apparent reason. Pain is worse with fully bending or fully extending her R elbow.  She is very tender to palpate over the lateral epicondyle and common extensor tendon.  She has also positive nerve tension tests for the radial, ulnar, and median nerves.  Her dorsal arm pain is also influenced by shoulder rotational movements.  Cervical spine does not seem to be involved.   Differential includes thoracic outlet, peripheral nerve pathology/entrapment, lateral epicondylitis, or a combo of them.  She does have h/o radiation and lymph node removal for R breast CA so brachial plexus pathology could be contributing to her symptoms as well.  Patient has deficits in R elbow ROM, RUE flexibility, RUE strength, with pain and TTP of the R lateral elbow and dorsal forearm which are interfering with ADLs and are impacting quality of life.  On QuickDash patient scored 38% demonstrating moderate functional limitation of the RUE.  Briannia will benefit from skilled PT to address above deficits to improve mobility and activity tolerance with decreased pain interference.  OBJECTIVE  IMPAIRMENTS: decreased ROM, decreased strength, impaired flexibility, impaired UE functional use, and pain.   ACTIVITY LIMITATIONS: carrying, lifting, reach over head, and hygiene/grooming  PARTICIPATION LIMITATIONS: meal prep, cleaning, laundry, community activity, and occupation  PERSONAL FACTORS: Age, Fitness, and Time since onset of injury/illness/exacerbation are also affecting patient's functional outcome.   REHAB POTENTIAL: Good  CLINICAL DECISION MAKING: Evolving/moderate complexity  EVALUATION COMPLEXITY: Moderate   GOALS: Goals reviewed with patient? Yes  SHORT TERM GOALS: Target date: 07/22/2024   Patient will be independent with initial HEP to improve outcomes and carryover.  Baseline: 100% PT assist required for correct completion Goal status: MET- 07/13/24  2.  Patient will report 25% improvement in neck pain to improve QOL.  Baseline: 8/10 worst 07/17/24:  3/10 today Goal status: IN PROGRESS   LONG TERM GOALS: Target date: 08/19/2024   Patient will be independent with ongoing/advanced HEP for self-management  at home.  Baseline: no advanced HEP yet 07/16/24:  continuing to advance w/ resistance and types of exercise Goal status: IN PROGRESS  3.  Patient will report 50-75% improvement in neck pain to improve QOL.  Baseline: 8/10 worst 07/17/24:  3/10 today Goal status: IN PROGRESS   5.  Patient will demonstrate full pain free R elbow flexion and extension ROM to be able to do ADL, housework, and job unhindered.  Baseline: Refer to above UE ROM table Goal status: INITIAL  6.  Patient will report </= 20% on (MCID = 14%) to demonstrate improved functional ability.  Baseline: 38% Goal status: INITIAL  7.  Patient will demonstrate 5/5 strength throughout the RUE to be able to do functional tasks without danger of dropping objects or causing self injury   Baseline: see MMT tables above Goal status: INITIAL   PLAN:  PT FREQUENCY: 1-2x/week  PT DURATION: 8  weeks  PLANNED INTERVENTIONS: 97164- PT Re-evaluation, 97110-Therapeutic exercises, 97530- Therapeutic activity, 97112- Neuromuscular re-education, 97535- Self Care, 02859- Manual therapy, [id number]- Aquatic Therapy, H9716- Electrical stimulation (unattended), 97016- Vasopneumatic device, [id number]- Ultrasound, [id number]- Traction (mechanical), [id number]- Ionotophoresis 4mg /ml Dexamethasone , 79439 (1-2 muscles), 20561 (3+ muscles)- Dry Needling, Patient/Family education, Taping, Joint mobilization, Spinal mobilization, Cryotherapy, and Moist heat  PLAN FOR NEXT SESSION: Increase DB weight for wrist curl/flexor strengthening, use hammer or weighted stick for pronation/supination if pain isn't flared up next week; work on activities in wrist flexed position  Dresden Ament, PT 07/17/2024, 8:15 AM

## 2024-07-21 ENCOUNTER — Ambulatory Visit

## 2024-07-21 DIAGNOSIS — M25521 Pain in right elbow: Secondary | ICD-10-CM | POA: Diagnosis not present

## 2024-07-21 DIAGNOSIS — M6281 Muscle weakness (generalized): Secondary | ICD-10-CM | POA: Diagnosis not present

## 2024-07-21 DIAGNOSIS — M25621 Stiffness of right elbow, not elsewhere classified: Secondary | ICD-10-CM

## 2024-07-21 NOTE — Therapy (Signed)
 OUTPATIENT PHYSICAL THERAPY CERVICAL/UE TREATMENT   Patient Name: [name] [name] MRN: [id number] DOB:[birth year], 53 y.o., female Today's Date: 07/21/2024   END OF SESSION:       Past Medical History:  Diagnosis Date   Allergy    Dust mites, ragweed, grass   Breast cancer (HCC)    Breast cancer (HCC)    CHF (congestive heart failure) (HCC)    Family history of breast cancer    Family history of prostate cancer    GERD (gastroesophageal reflux disease)    during pregnancy    Hypertension    Pneumonia    hx of pneumonia 08/2013    S/P radiation therapy 05/03/2015 through 06/14/2015                                                      Right breast 4680 cGy in 26 sessions, right breast boost 1000 cGy in 5 sessions.  Right supraclavicular/axillary region 4680 cGy with a supplemental PA field to bring the axillary dose up to 4500 cGy in 26 sessions   Past Surgical History:  Procedure Laterality Date   BREAST LUMPECTOMY WITH RADIOACTIVE SEED AND SENTINEL LYMPH NODE BIOPSY Right 03/29/2015   Procedure: BREAST LUMPECTOMY WITH RADIOACTIVE SEED AND SENTINEL LYMPH NODE BIOPSY;  Surgeon: Jina Nephew, MD;  Location: [facility];  Service: General;  Laterality: Right;   BREAST SURGERY     CESAREAN SECTION     CHOLECYSTECTOMY     ESSURE TUBAL LIGATION     PORTACATH PLACEMENT Left 07/21/2014   Procedure: INSERTION PORT-A-CATH;  Surgeon: Jina Nephew, MD;  Location: [facility];  Service: General;  Laterality: Left;   WISDOM TOOTH EXTRACTION     Patient Active Problem List   Diagnosis Date Noted   Abnormal endometrial ultrasound 03/16/2024   Abnormal uterine bleeding (AUB) 03/16/2024   Other fatigue 03/20/2023   Hyperglycemia 03/20/2023   Family history of liver cancer 03/20/2023   Genetic testing 08/10/2021   Family history of prostate cancer 07/27/2021   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   Seborrheic dermatitis of scalp 06/29/2021   Prediabetes 12/27/2020   Benign  essential hypertension 11/12/2019   Dysmenorrhea 12/12/2018   Port-A-Cath in place 08/21/2018   Vitamin D deficiency 07/15/2018   Candidiasis of breast 04/05/2016   Eczema 04/05/2016   Port-A-Cath in place 12/06/2015   Seasonal allergies 10/04/2015   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   Peripheral neuropathy due to chemotherapy 03/15/2015   Breast cancer 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 (HCC) 07/22/2014   Family history of breast cancer 07/22/2014   Lung mas bilaterial 07/22/2014    PCP: Colette Torrence CINDERELLA, MD   REFERRING PROVIDER: Lanny Callander, MD   REFERRING DIAG: C50.911,C78.00 (ICD-10-CM) - 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   Patient was dx'd with R breast CA with mets to lung in 2015 and had lumpectomy, chemo, and radiation.  Radiation was to R anterior lung and she had axillary lymph node dissection.   Her scans have showed no evidence of disease for the following years since, but she does have R lung scarring.  Oncology note states patient is likely cured and she has stopped all medications.   Patient symptoms are pain in the R dorsal arm from just above her elbow almost to the wrist.  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   States she has never had  the neuropathy advance more proximally than her fingertips.  States this pain is different than her finger neuropathy in that it is much sharper and different location.   She states the pain is constant of varying intensity.   Pain started 2 months ago for no apparent reason.   She denies any trauma, falls,  or changes in her activity level.   She is not involved in any sports activities.  Pain is over the R elbow and dorsal forearm.     Straightening her R elbow increases pain.   Full elbow flexion and reaching behind her back or head also increases pain.  Holding the elbow in midrange feels best.  Works at a desk all day and is using her mouse constantly.    Hand dominance: Right  PAIN: Are you having pain? Yes: NPRS scale: 2/10 now; 2/10 best; 8/10 worst Pain location: R dorsal forearm to just above the elbow Pain description: shooting, stabbing Aggravating factors: fully straightening her elbow or fully bending the elbow Relieving factors: keeping her elbow in mid range positions  PERTINENT HISTORY:  Chemo induced peripheral neuropathy, HTN, GERD  PRECAUTIONS: Other: L breast CA lymphedema precautions  RED FLAGS: None  HAND DOMINANCE: Right  WEIGHT BEARING RESTRICTIONS: No  FALLS:  Has patient fallen in last 6 months? 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   RADIATION DOSE REDUCTION: This exam was performed according to the departmental dose-optimization program which includes automated exposure control, adjustment of the mA and/or kV according to patient size and/or use of iterative reconstruction technique.   CONTRAST:  OMNIPAQUE  IOHEXOL  300 MG/ML  SOLN   COMPARISON:  04/03/2023   FINDINGS: CT CHEST FINDINGS   Cardiovascular: Left chest port catheter. Normal heart size. No pericardial effusion.   Mediastinum/Nodes: No enlarged mediastinal, hilar, or axillary lymph nodes. Thyroid  gland, trachea, and esophagus demonstrate no significant findings.   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 Irregular  scarring in the suprahilar left upper lobe (series 4, image 51). No pleural effusion or pneumothorax.   Musculoskeletal: Status post lower inner right lumpectomy and right axillary lymph node dissection. No acute osseous findings.   CT ABDOMEN PELVIS FINDINGS   Hepatobiliary: No focal liver abnormality is seen. Hepatic steatosis. Status post cholecystectomy. No biliary dilatation.   Pancreas: Unremarkable. No pancreatic ductal dilatation or surrounding inflammatory changes.   Spleen: Normal in size without significant abnormality.   Adrenals/Urinary Tract: Adrenal glands are unremarkable. Small nonobstructive right renal calculi. No left-sided calculi, ureteral calculi, or hydronephrosis. Bladder is unremarkable.   Stomach/Bowel: Stomach is within normal limits. Appendix appears normal. No evidence of bowel wall thickening, distention, or inflammatory changes. Pancolonic diverticulosis.   Vascular/Lymphatic: No significant vascular findings are present. No enlarged abdominal or pelvic lymph nodes.   Reproductive: No mass or other abnormality.  Tubal ligation devices.   Other: Small fat containing umbilical  hernia.  No ascites.   Musculoskeletal: No acute osseous findings. Gentle S shaped scoliosis of the thoracolumbar spine.   IMPRESSION: 1. Status post lower inner right lumpectomy and right axillary lymph node dissection. 2. 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 Irregular scarring in the suprahilar left upper lobe. 3. No evidence of lymphadenopathy or metastatic disease in the chest, abdomen, or pelvis. 4. Hepatic steatosis. 5. Small nonobstructive right renal calculi. 6. Pancolonic diverticulosis without evidence of acute diverticulitis.     Electronically Signed   By: Marolyn JONETTA Jaksch M.D.   On: 01/30/2024 10:36  PATIENT SURVEYS:  Quick Dash:  QUICK DASH  Please rate your ability do the following activities in the last week by selecting the number below the appropriate response.   Activities Rating  Open a tight or new jar.  2 = Mild difficulty  Do heavy household chores (e.g., wash walls, floors). 5 = Unable  Carry a shopping bag or briefcase 2 = Mild difficulty  Wash your back. 3 = Moderate difficulty  Use a knife to cut food. 3 = Moderate difficulty  Recreational activities in which you take some force or impact through your arm, shoulder or hand (e.g., golf, hammering, tennis, etc.). 2 = Mild difficulty  During the past week, to what extent has your arm, shoulder or hand problem interfered with your normal social activities with family, friends, neighbors or groups?  1 = Not at all  During the past week, were you limited in your work or other regular daily activities as a result of your arm, shoulder or hand problem? 1 = Not limited at all  Rate the severity of the following symptoms in the last week: Arm, Shoulder, or hand pain. 3 = Moderate  Rate the severity of the following symptoms in the last week: Tingling (pins and needles) in your arm, shoulder or hand. 3 = Moderate  During the past week, how much difficulty have you had sleeping because of the  pain in your arm, shoulder or hand?  3 = Moderate difficulty   (A QuickDASH score may not be calculated if there is greater than 1 missing item.)  Quick Dash Disability/Symptom Score: [(sum of 28  (n) responses/11 (n)] x 25 = 38.63   Minimally Clinically Important Difference (MCID): 15-20 points  (Franchignoni, F. et al. (2013). Minimally clinically important difference of the disabilities of the arm, shoulder, and hand outcome measures (DASH) and its shortened version (Quick DASH). Journal of Orthopaedic & Sports Physical Therapy, 44(1), 30-39)   COGNITION: Overall cognitive status: Within  functional limits for tasks assessed  SENSATION: WFL  POSTURE:  No Significant postural limitations  PALPATION: Very TTP just above the R lateral elbow, over the epicondyle, common extensor tendon   CERVICAL ROM: all cervical motions are pain free and do not affect the R elbow pain  Active ROM A/PROM  eval  Flexion 100%  Extension 65%  Right lateral flexion 75%  Left lateral flexion 50%  Right rotation 90%  Left rotation 90%   (Blank rows = not tested)  UPPER EXTREMITY ROM:  Active ROM Right eval Left eval R 07/21/24  Shoulder flexion 160; p! 180   Shoulder extension     Shoulder abduction 150; p! 180   Shoulder adduction     Shoulder internal rotation L5; p! T12   Shoulder external rotation At side: 70 deg no pain;   at 90: 50 deg with pain 90 at side and at 90   Elbow flexion Full and equal to LUE, but painful  150  Elbow extension -15; pain 0 5 deg- hard stretch  Wrist flexion 70; some pain 75   Wrist extension 90; no increased pain 90   Wrist ulnar deviation     Wrist radial deviation     Wrist pronation Full and equal BUE    Wrist supination Full and equal BUE     (Blank rows = not tested)  UPPER EXTREMITY MMT:  MMT Right eval Left eval  Shoulder flexion 4; limited by elbow p! 5  Shoulder extension    Shoulder abduction 4; limited by elbow p! 5  Shoulder adduction     Shoulder internal rotation 5 5  Shoulder external rotation 4; elbow p! 5  Middle trapezius    Lower trapezius    Elbow flexion 4; p! 5  Elbow extension 4; p! 5  Wrist flexion 5 5  Wrist extension 4+; some pain 5  Wrist ulnar deviation    Wrist radial deviation    Wrist pronation 4+; some p! 5  Wrist supination 4-; lots pain 5  Grip strength Weaker due to pain    (Blank rows = not tested)  CERVICAL SPECIAL TESTS:  Spurling's test: Negative and Distraction test: Negative   ULTT is positive for radial, ulnar, and median nerves Hand grip is good strength, a little painful  Resisted pronation is good a little painful  Resisted supination is very painful and weak  Finger abduction strength is equal BLE  Thoracic outlet tests are difficult to complete due to increased pain with straightening her elbow  Tinel's testing is negative over all R elbow bony prominences and over the ulnar nerve notch (she denies any falls or trauma to R elbow)  FUNCTIONAL TESTS:  NA     TODAY'S TREATMENT:  07/21/24 THERAPEUTIC EXERCISE: To improve strength and endurance.  Demonstration, verbal and tactile cues throughout for technique. UBE L1.0 3 min each way Measured elbow ROM- see table Standing R wrist flexion stretch 2x30'  Seated rows 20lb 2x10 low grip Standing lat pull down 20lb x 20 Standing elbow 90 deg wrist flexion 2 x 10 Standing elbow 90 deg wrist extension 2 x 10 Standing R bicep curls 3lb 2 x 10; pronated curls 2x10 3lb  MODALITIES: Continuous US  100% x 1 Mhz x 1.4 w/cm2 x 8' to R common extensor tendon w/ medium sound head  07/16/24 THERAPEUTIC EXERCISE: To improve strength and endurance.  Demonstration, verbal and tactile cues throughout for technique. UBE L0 x 3'F/3'B  THERAPEUTIC ACTIVITIES: To improve functional  performance.  Demonstration, verbal and tactile cues throughout for technique. Wrist curl 2# x 20 RUE Wrist extension 2# w/ slow eccentric phase x 20 RUE Wrist  pronation/supination 2# x 20 RUE Bicep curl in supination 2# x 20 RUE Wrist flexion stretch x 1' x 2 RUE Rowing low grip 15# x 2/10 BUE  MODALITIES: Continuous US  100% x 1 Mhz x 1.4 w/cm2 x 8' to R common extensor tendon w/ medium sound head  MANUAL THERAPY: To promote reduced pain utilizing kinesiotaping. KT tape w/ star pattern for space correction to common extensor tendon RUE  07/13/24 THERAPEUTIC EXERCISE: To improve strength and endurance.  Demonstration, verbal and tactile cues throughout for technique. UBE L1 x 3'F/3'B Wrist curls 2lb 2 x 10 Pronation/supination 2lb 2  x 10 Wrist extension 2lb 2 x 10 Rows 15lb low grip 2 x 10  MODALITIES: Continuous US  100% x 1 Mhz x 1.2 w/cm2 x 8' to R common extensor tendon w/ medium sized sound head  CP to R CET and wrist extensors x 5 min  07/08/24 THERAPEUTIC EXERCISE: To improve strength and endurance.  Demonstration, verbal and tactile cues throughout for technique. UBE L0 x 3'F/3'B  THERAPEUTIC ACTIVITIES: To improve functional performance.  Demonstration, verbal and tactile cues throughout for technique. Wrist curls 2# x 20 Wrist extension w/ eccentric lowering 0# x 20 Radial glides:  Hands crossed and clasped w/ shoulder flexion  Waiters glide  Cobra glide Wrist flexion stretch at wall x 1' x 3 RUE  MODALITIES: Continuous US  100% x 1 Mhz x 1.4 w/cm2 x 8' to R common extensor tendon w/ large sound head  MANUAL THERAPY: To promote reduced pain utilizing therapeutic massage and myofascial release. MFR w/ US  gel to common extensor tendon R elbow; KT tape to extensor tendon w/ 1 I strip longitudinally and 1 short I strip across at the CET  07/01/24 THERAPEUTIC EXERCISE: To improve strength, endurance, ROM, and flexibility.  UBE 3 min fwd/ 3 min back Seated L wrist extension 1lb x 20 Seated L forearm pronation/supination x 20 1lb Seated green theraputty squeeze x 12; three finger pinch for fine motor skill x 10 MANUAL  THERAPY: To promote normalized muscle tension, improve joint mobility and/or for pain modulation  Massage gun to R wrist extensors close to CET near elbow Ionto Patch (#1 of 6) to R elbow - Dexamethasone  1 mL, 80 mA-min, 4-6 hr wear time  06/24/24 SELF CARE: Provided education on PT POC progression., initial HEP, tennis elbow strap, using ice for pain relief, ionto education handout;  self massage of the lateral epicondyle  Ionto Patch (#1 of 6) to R elbow - Dexamethasone  1 mL, 80 mA-min, 4-6 hr wear time    PATIENT EDUCATION:  Education details: HEP review and HEP update  Person educated: Patient Education method: Explanation, Demonstration, Verbal cues, Tactile cues, Handouts, and MedBridgeGO app access provided Education comprehension: verbalized understanding, verbal cues required, tactile cues required, and needs further education  HOME EXERCISE PROGRAM: Access Code: [email or web address] URL: [email or web address] Date: 07/16/2024 Prepared by: Garnette Montclair  Exercises - Radial Nerve Gliding: Waiter's Tip  - 1 x daily - 7 x weekly - 3 sets - 10 reps - Standing Radial Nerve Glide  - 1 x daily - 7 x weekly - 2 sets - 10 reps - 3 sec hold - Radial Nerve Gliding: Waiter's Tip  - 1 x daily - 7 x weekly - 2 sets - 10 reps - 3 sec hold - Radial Nerve Cross-Over  -  1 x daily - 7 x weekly - 1 sets - 10 reps - 3 sec hold - Wrist Extension Stretch at Wall  - 1 x daily - 7 x weekly - 1 sets - 2 reps - 1 min hold - Seated Wrist Flexion with Dumbbell  - 1 x daily - 7 x weekly - 2 sets - 10 reps - Seated Eccentric Wrist Extension  - 1 x daily - 7 x weekly - 3 sets - 10 reps - Seated Wrist Supination Pronation with Can  - 1 x daily - 7 x weekly - 1 sets - 10 reps - Seated Single Arm Elbow Flexion with Dumbbell  - 1 x daily - 7 x weekly - 3 sets - 10 reps - Seated Wrist Flexion Stretch  - 1 x daily - 7 x weekly - 1 sets - 2 reps - 1 min hold  ASSESSMENT:  CLINICAL IMPRESSION: Pt with  less pain reported today. Reassessed elbow ROM, with her still lacking full extension by 5 deg. Reports 70% improvement in elbow pain. Pain continues to show improvement. Able to work on wrist strengthening with UE stabilization with good response. Continued US  to CET, as this seems to improve symptoms. PT remains necessary for pain, strength, function of the R elbow and wrist.   Continue per POC  RYNA BECKSTROM is a 53 y.o. female who was referred to physical therapy for evaluation and treatment for RUE pain/weakness/numbness  Patient reports onset of R lateral elbow and forearm pain beginning 2 months ago for no apparent reason. Pain is worse with fully bending or fully extending her R elbow.  She is very tender to palpate over the lateral epicondyle and common extensor tendon.  She has also positive nerve tension tests for the radial, ulnar, and median nerves.  Her dorsal arm pain is also influenced by shoulder rotational movements.  Cervical spine does not seem to be involved.   Differential includes thoracic outlet, peripheral nerve pathology/entrapment, lateral epicondylitis, or a combo of them.  She does have h/o radiation and lymph node removal for R breast CA so brachial plexus pathology could be contributing to her symptoms as well.  Patient has deficits in R elbow ROM, RUE flexibility, RUE strength, with pain and TTP of the R lateral elbow and dorsal forearm which are interfering with ADLs and are impacting quality of life.  On QuickDash patient scored 38% demonstrating moderate functional limitation of the RUE.  [place] will benefit from skilled PT to address above deficits to improve mobility and activity tolerance with decreased pain interference.  OBJECTIVE IMPAIRMENTS: decreased ROM, decreased strength, impaired flexibility, impaired UE functional use, and pain.   ACTIVITY LIMITATIONS: carrying, lifting, reach over head, and hygiene/grooming  PARTICIPATION LIMITATIONS: meal prep, cleaning,  laundry, community activity, and occupation  PERSONAL FACTORS: Age, Fitness, and Time since onset of injury/illness/exacerbation are also affecting patient's functional outcome.   REHAB POTENTIAL: Good  CLINICAL DECISION MAKING: Evolving/moderate complexity  EVALUATION COMPLEXITY: Moderate   GOALS: Goals reviewed with patient? Yes  SHORT TERM GOALS: Target date: 07/22/2024   Patient will be independent with initial HEP to improve outcomes and carryover.  Baseline: 100% PT assist required for correct completion Goal status: MET- 07/13/24  2.  Patient will report 25% improvement in neck pain to improve QOL.  Baseline: 8/10 worst 07/17/24:  3/10 today Goal status: IN PROGRESS   LONG TERM GOALS: Target date: 08/19/2024   Patient will be independent with ongoing/advanced HEP for self-management at  home.  Baseline: no advanced HEP yet 07/16/24:  continuing to advance w/ resistance and types of exercise Goal status: IN PROGRESS  3.  Patient will report 50-75% improvement in neck pain to improve QOL.  Baseline: 8/10 worst 07/17/24:  3/10 today Goal status: IN PROGRESS   5.  Patient will demonstrate full pain free R elbow flexion and extension ROM to be able to do ADL, housework, and job unhindered.  Baseline: Refer to above UE ROM table Goal status: IN PROGRESS- 07/21/24- lacking full extension today, flexion WNL  6.  Patient will report </= 20% on (MCID = 14%) to demonstrate improved functional ability.  Baseline: 38% Goal status: INITIAL  7.  Patient will demonstrate 5/5 strength throughout the RUE to be able to do functional tasks without danger of dropping objects or causing self injury   Baseline: see MMT tables above Goal status: INITIAL   PLAN:  PT FREQUENCY: 1-2x/week  PT DURATION: 8 weeks  PLANNED INTERVENTIONS: 97164- PT Re-evaluation, 97110-Therapeutic exercises, 97530- Therapeutic activity, 97112- Neuromuscular re-education, 97535- Self Care, [id number]- Manual  therapy, [phone]- Aquatic Therapy, H9716- Electrical stimulation (unattended), 97016- Vasopneumatic device, [id number]- Ultrasound, [id number]- Traction (mechanical), [id number]- Ionotophoresis 4mg /ml Dexamethasone , 79439 (1-2 muscles), 20561 (3+ muscles)- Dry Needling, Patient/Family education, Taping, Joint mobilization, Spinal mobilization, Cryotherapy, and Moist heat  PLAN FOR NEXT SESSION: Increase DB weight for wrist curl/flexor strengthening, use hammer or weighted stick for pronation/supination if pain isn't flared up next week; work on activities in wrist flexed position  [employer], PTA 07/21/2024, 8:06 AM

## 2024-07-23 ENCOUNTER — Ambulatory Visit: Admitting: Rehabilitation

## 2024-07-23 ENCOUNTER — Encounter: Payer: Self-pay | Admitting: Rehabilitation

## 2024-07-23 DIAGNOSIS — M25521 Pain in right elbow: Secondary | ICD-10-CM

## 2024-07-23 DIAGNOSIS — M25621 Stiffness of right elbow, not elsewhere classified: Secondary | ICD-10-CM | POA: Diagnosis not present

## 2024-07-23 DIAGNOSIS — M6281 Muscle weakness (generalized): Secondary | ICD-10-CM

## 2024-07-23 NOTE — Therapy (Signed)
 OUTPATIENT PHYSICAL THERAPY CERVICAL/UE TREATMENT   Patient Name: [name] [name] MRN: [id number] DOB:1970-11-16, 53 y.o., female Today's Date: 07/23/2024   END OF SESSION:  PT End of Session - 07/23/24 0803     Visit Number 7    Date for Recertification  08/19/24    PT Start Time 0801    PT Stop Time 0850    PT Time Calculation (min) 49 min    Activity Tolerance Patient tolerated treatment well;No increased pain    Behavior During Therapy WFL for tasks assessed/performed              Past Medical History:  Diagnosis Date   Allergy    Dust mites, ragweed, grass   Breast cancer (HCC)    Breast cancer (HCC)    CHF (congestive heart failure) (HCC)    Family history of breast cancer    Family history of prostate cancer    GERD (gastroesophageal reflux disease)    during pregnancy    Hypertension    Pneumonia    hx of pneumonia 08/2013    S/P radiation therapy 05/03/2015 through 06/14/2015                                                      Right breast 4680 cGy in 26 sessions, right breast boost 1000 cGy in 5 sessions.  Right supraclavicular/axillary region 4680 cGy with a supplemental PA field to bring the axillary dose up to 4500 cGy in 26 sessions   Past Surgical History:  Procedure Laterality Date   BREAST LUMPECTOMY WITH RADIOACTIVE SEED AND SENTINEL LYMPH NODE BIOPSY Right 03/29/2015   Procedure: BREAST LUMPECTOMY WITH RADIOACTIVE SEED AND SENTINEL LYMPH NODE BIOPSY;  Surgeon: Jina Nephew, MD;  Location: [facility];  Service: General;  Laterality: Right;   BREAST SURGERY     CESAREAN SECTION     CHOLECYSTECTOMY     ESSURE TUBAL LIGATION     PORTACATH PLACEMENT Left 07/21/2014   Procedure: INSERTION PORT-A-CATH;  Surgeon: Jina Nephew, MD;  Location: [facility];  Service: General;  Laterality: Left;   WISDOM TOOTH EXTRACTION     Patient Active Problem List   Diagnosis Date Noted   Abnormal endometrial ultrasound 03/16/2024   Abnormal uterine  bleeding (AUB) 03/16/2024   Other fatigue 03/20/2023   Hyperglycemia 03/20/2023   Family history of liver cancer 03/20/2023   Genetic testing 08/10/2021   Family history of prostate cancer 07/27/2021   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   Seborrheic dermatitis of scalp 06/29/2021   Prediabetes 12/27/2020   Benign essential hypertension 11/12/2019   Dysmenorrhea 12/12/2018   Port-A-Cath in place 08/21/2018   Vitamin D deficiency 07/15/2018   Candidiasis of breast 04/05/2016   Eczema 04/05/2016   Port-A-Cath in place 12/06/2015   Seasonal allergies 10/04/2015   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   Peripheral neuropathy due to chemotherapy 03/15/2015   Breast cancer metastasized to lung (HCC) 07/22/2014   Family history of breast cancer 07/22/2014   Lung mas bilaterial 07/22/2014    PCP: Colette Torrence CINDERELLA, MD   REFERRING PROVIDER: Lanny Callander, MD   REFERRING DIAG: C50.911,C78.00 (ICD-10-CM) - 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    53 y/o patient referred to PT for RUE pain/weakness/numbness from her oncologist.   Patient was dx'd with R breast CA with mets to lung in 2015 and had lumpectomy, chemo, and radiation.  Radiation was to R anterior lung and she had axillary lymph node dissection.   Her scans have showed no evidence of disease for the following years since, but she does have R lung scarring.  Oncology note states patient is  likely cured and she has stopped all medications.   Patient symptoms are pain in the R dorsal arm from just above her elbow almost to the wrist.  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   States she has never had the neuropathy advance more proximally than her fingertips.  States this pain is different than her finger neuropathy in that it is much sharper and different location.   She states the pain is constant of varying intensity.   Pain started 2 months ago for no apparent reason.   She denies any trauma, falls,  or changes in her activity level.   She is not involved in any sports activities.  Pain is over the R elbow and dorsal forearm.     Straightening her R elbow increases pain.   Full elbow flexion and reaching behind her back or head also increases pain.  Holding the elbow in midrange feels best.  Works at a desk all day and is using her mouse constantly.    Hand dominance: Right  PAIN: Are you having pain? Yes: NPRS scale: 2/10 now; 2/10 best; 8/10 worst Pain location: R dorsal forearm to just above the elbow Pain description: shooting, stabbing Aggravating factors: fully straightening her elbow or fully bending the elbow Relieving factors: keeping her elbow in mid range positions  PERTINENT HISTORY:  Chemo induced peripheral neuropathy, HTN, GERD  PRECAUTIONS: Other: L breast CA lymphedema precautions  RED FLAGS: None  HAND DOMINANCE: Right  WEIGHT BEARING RESTRICTIONS: No  FALLS:  Has patient fallen in last 6 months? 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   RADIATION DOSE REDUCTION: This exam was performed according to the departmental dose-optimization program which includes automated exposure control, adjustment of the mA and/or kV according to patient size and/or use of iterative reconstruction technique.   CONTRAST:  OMNIPAQUE  IOHEXOL  300 MG/ML  SOLN   COMPARISON:  04/03/2023   FINDINGS: CT CHEST FINDINGS   Cardiovascular: Left chest port catheter. Normal heart size. No pericardial effusion.   Mediastinum/Nodes: No enlarged mediastinal, hilar, or axillary lymph nodes. Thyroid  gland, trachea, and esophagus demonstrate no significant findings.   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 Irregular scarring in the suprahilar left upper lobe (series 4, image 51). No pleural effusion or pneumothorax.   Musculoskeletal: Status post lower inner right lumpectomy and right axillary lymph node dissection. No acute osseous findings.   CT ABDOMEN PELVIS FINDINGS   Hepatobiliary: No focal liver abnormality is seen. Hepatic steatosis. Status post cholecystectomy. No biliary dilatation.   Pancreas: Unremarkable. No pancreatic ductal dilatation or surrounding inflammatory changes.   Spleen: Normal in size without significant abnormality.   Adrenals/Urinary Tract: Adrenal glands are unremarkable. Small nonobstructive right renal calculi. No left-sided calculi, ureteral calculi, or hydronephrosis. Bladder is unremarkable.   Stomach/Bowel: Stomach is within normal limits. Appendix appears normal. No evidence of bowel wall  thickening, distention, or inflammatory changes. Pancolonic diverticulosis.   Vascular/Lymphatic: No significant vascular findings are present. No enlarged abdominal or pelvic lymph nodes.   Reproductive: No mass or other abnormality.  Tubal ligation devices.   Other: Small fat containing umbilical hernia.  No ascites.   Musculoskeletal: No acute osseous findings. Gentle S shaped scoliosis of the thoracolumbar spine.   IMPRESSION: 1. Status post lower inner right lumpectomy and right axillary lymph node dissection. 2. 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 Irregular scarring in the suprahilar left upper lobe. 3. No evidence of lymphadenopathy or metastatic disease in the chest, abdomen, or pelvis. 4. Hepatic steatosis. 5. Small nonobstructive right renal calculi. 6. Pancolonic diverticulosis without evidence of acute diverticulitis.     Electronically Signed   By: Marolyn JONETTA Jaksch M.D.   On: 01/30/2024 10:36  PATIENT SURVEYS:  Quick Dash:  QUICK DASH  Please rate your ability do the following activities in the last week by selecting the number below the appropriate response.   Activities Rating  Open a tight or new jar.  2 = Mild difficulty  Do heavy household chores (e.g., wash walls, floors). 5 = Unable  Carry a shopping bag or briefcase 2 = Mild difficulty  Wash your back. 3 = Moderate difficulty  Use a knife to cut food. 3 = Moderate difficulty  Recreational activities in which you take some force or impact through your arm, shoulder or hand (e.g., golf, hammering, tennis, etc.). 2 = Mild difficulty  During the past week, to what extent has your  arm, shoulder or hand problem interfered with your normal social activities with family, friends, neighbors or groups?  1 = Not at all  During the past week, were you limited in your work or other regular daily activities as a result of your arm, shoulder or hand problem? 1 = Not limited at all  Rate the  severity of the following symptoms in the last week: Arm, Shoulder, or hand pain. 3 = Moderate  Rate the severity of the following symptoms in the last week: Tingling (pins and needles) in your arm, shoulder or hand. 3 = Moderate  During the past week, how much difficulty have you had sleeping because of the pain in your arm, shoulder or hand?  3 = Moderate difficulty   (A QuickDASH score may not be calculated if there is greater than 1 missing item.)  Quick Dash Disability/Symptom Score: [(sum of 28  (n) responses/11 (n)] x 25 = 38.63   Minimally Clinically Important Difference (MCID): 15-20 points  (Franchignoni, F. et al. (2013). Minimally clinically important difference of the disabilities of the arm, shoulder, and hand outcome measures (DASH) and its shortened version (Quick DASH). Journal of Orthopaedic & Sports Physical Therapy, 44(1), 30-39)   COGNITION: Overall cognitive status: Within functional limits for tasks assessed  SENSATION: WFL  POSTURE:  No Significant postural limitations  PALPATION: Very TTP just above the R lateral elbow, over the epicondyle, common extensor tendon   CERVICAL ROM: all cervical motions are pain free and do not affect the R elbow pain  Active ROM A/PROM  eval  Flexion 100%  Extension 65%  Right lateral flexion 75%  Left lateral flexion 50%  Right rotation 90%  Left rotation 90%   (Blank rows = not tested)  UPPER EXTREMITY ROM:  Active ROM Right eval Left eval R 07/21/24  Shoulder flexion 160; p! 180   Shoulder extension     Shoulder abduction 150; p! 180   Shoulder adduction     Shoulder internal rotation L5; p! T12   Shoulder external rotation At side: 70 deg no pain;   at 90: 50 deg with pain 90 at side and at 90   Elbow flexion Full and equal to LUE, but painful  150  Elbow extension -15; pain 0 5 deg- hard stretch  Wrist flexion 70; some pain 75   Wrist extension 90; no increased pain 90   Wrist ulnar deviation     Wrist  radial deviation     Wrist pronation Full and equal BUE    Wrist supination Full and equal BUE     (Blank rows = not tested)  UPPER EXTREMITY MMT:  MMT Right eval Left eval  Shoulder flexion 4; limited by elbow p! 5  Shoulder extension    Shoulder abduction 4; limited by elbow p! 5  Shoulder adduction    Shoulder internal rotation 5 5  Shoulder external rotation 4; elbow p! 5  Middle trapezius    Lower trapezius    Elbow flexion 4; p! 5  Elbow extension 4; p! 5  Wrist flexion 5 5  Wrist extension 4+; some pain 5  Wrist ulnar deviation    Wrist radial deviation    Wrist pronation 4+; some p! 5  Wrist supination 4-; lots pain 5  Grip strength Weaker due to pain    (Blank rows = not tested)  CERVICAL SPECIAL TESTS:  Spurling's test: Negative and Distraction test: Negative   ULTT is positive for radial, ulnar, and median  nerves Hand grip is good strength, a little painful  Resisted pronation is good a little painful  Resisted supination is very painful and weak  Finger abduction strength is equal BLE  Thoracic outlet tests are difficult to complete due to increased pain with straightening her elbow  Tinel's testing is negative over all R elbow bony prominences and over the ulnar nerve notch (she denies any falls or trauma to R elbow)  FUNCTIONAL TESTS:  NA     TODAY'S TREATMENT:  07/23/24 THERAPEUTIC EXERCISE: To improve strength and endurance.  Demonstration, verbal and tactile cues throughout for technique. UBE L 2.5 x  3'F/3'B  NEUROMUSCULAR RE-EDUCATION: To improve kinesthesia and proprioception. Radial nerve glides  Cobra x 20 RUE  Cross arm/grip w/ shoulder flexion x 10 BUE  THERAPEUTIC ACTIVITIES: To improve functional performance.  Demonstration, verbal and tactile cues throughout for technique. Velcro roll pronation/supination in wrist neutral position x 3 back and forth RUE Hammer pronate/supine x 10 RUE Wrist flexion 3# x 20 Wrist ext 3# x 20 w/  controlled eccentric phase Standing elbow flexion 3# supinated wrist x 2/10;  pronated wrist x 2/10 RUE Seated row 15# x 2/10  Seated lat PD 20# x 2/10  Seated R UT stretch x 1'  MODALITIES: Continuous US  100% x 1 Mhz x 1.4 w/cm2 x 8' to R common extensor tendon w/ medium sound head  07/21/24 THERAPEUTIC EXERCISE: To improve strength and endurance.  Demonstration, verbal and tactile cues throughout for technique. UBE L1.0 3 min each way Measured elbow ROM- see table Standing R wrist flexion stretch 2x30'  Seated rows 20lb 2x10 low grip Standing lat pull down 20lb x 20 Standing elbow 90 deg wrist flexion 2 x 10 Standing elbow 90 deg wrist extension 2 x 10 Standing R bicep curls 3lb 2 x 10; pronated curls 2x10 3lb  MODALITIES: Continuous US  100% x 1 Mhz x 1.4 w/cm2 x 8' to R common extensor tendon w/ medium sound head  07/16/24 THERAPEUTIC EXERCISE: To improve strength and endurance.  Demonstration, verbal and tactile cues throughout for technique. UBE L0 x 3'F/3'B  THERAPEUTIC ACTIVITIES: To improve functional performance.  Demonstration, verbal and tactile cues throughout for technique. Wrist curl 2# x 20 RUE Wrist extension 2# w/ slow eccentric phase x 20 RUE Wrist pronation/supination 2# x 20 RUE Bicep curl in supination 2# x 20 RUE Wrist flexion stretch x 1' x 2 RUE Rowing low grip 15# x 2/10 BUE  MODALITIES: Continuous US  100% x 1 Mhz x 1.4 w/cm2 x 8' to R common extensor tendon w/ medium sound head  MANUAL THERAPY: To promote reduced pain utilizing kinesiotaping. KT tape w/ star pattern for space correction to common extensor tendon RUE  07/13/24 THERAPEUTIC EXERCISE: To improve strength and endurance.  Demonstration, verbal and tactile cues throughout for technique. UBE L1 x 3'F/3'B Wrist curls 2lb 2 x 10 Pronation/supination 2lb 2  x 10 Wrist extension 2lb 2 x 10 Rows 15lb low grip 2 x 10  MODALITIES: Continuous US  100% x 1 Mhz x 1.2 w/cm2 x 8' to R common  extensor tendon w/ medium sized sound head  CP to R CET and wrist extensors x 5 min  07/08/24 THERAPEUTIC EXERCISE: To improve strength and endurance.  Demonstration, verbal and tactile cues throughout for technique. UBE L0 x 3'F/3'B  THERAPEUTIC ACTIVITIES: To improve functional performance.  Demonstration, verbal and tactile cues throughout for technique. Wrist curls 2# x 20 Wrist extension w/ eccentric lowering 0# x 20 Radial  glides:  Hands crossed and clasped w/ shoulder flexion  Waiters glide  Cobra glide Wrist flexion stretch at wall x 1' x 3 RUE  MODALITIES: Continuous US  100% x 1 Mhz x 1.4 w/cm2 x 8' to R common extensor tendon w/ large sound head  MANUAL THERAPY: To promote reduced pain utilizing therapeutic massage and myofascial release. MFR w/ US  gel to common extensor tendon R elbow; KT tape to extensor tendon w/ 1 I strip longitudinally and 1 short I strip across at the CET  07/01/24 THERAPEUTIC EXERCISE: To improve strength, endurance, ROM, and flexibility.  UBE 3 min fwd/ 3 min back Seated L wrist extension 1lb x 20 Seated L forearm pronation/supination x 20 1lb Seated green theraputty squeeze x 12; three finger pinch for fine motor skill x 10 MANUAL THERAPY: To promote normalized muscle tension, improve joint mobility and/or for pain modulation  Massage gun to R wrist extensors close to CET near elbow Ionto Patch (#1 of 6) to R elbow - Dexamethasone  1 mL, 80 mA-min, 4-6 hr wear time  06/24/24 SELF CARE: Provided education on PT POC progression., initial HEP, tennis elbow strap, using ice for pain relief, ionto education handout;  self massage of the lateral epicondyle  Ionto Patch (#1 of 6) to R elbow - Dexamethasone  1 mL, 80 mA-min, 4-6 hr wear time    PATIENT EDUCATION:  Education details: HEP review and HEP update  Person educated: Patient Education method: Explanation, Demonstration, Verbal cues, Tactile cues, Handouts, and MedBridge[employer] app access  provided Education comprehension: verbalized understanding, verbal cues required, tactile cues required, and needs further education  HOME EXERCISE PROGRAM: Access Code: [email or web address] URL: [email or web address] Date: 07/16/2024 Prepared by: Garnette Montclair  Exercises - Radial Nerve Gliding: Waiter's Tip  - 1 x daily - 7 x weekly - 3 sets - 10 reps - Standing Radial Nerve Glide  - 1 x daily - 7 x weekly - 2 sets - 10 reps - 3 sec hold - Radial Nerve Gliding: Waiter's Tip  - 1 x daily - 7 x weekly - 2 sets - 10 reps - 3 sec hold - Radial Nerve Cross-Over  - 1 x daily - 7 x weekly - 1 sets - 10 reps - 3 sec hold - Wrist Extension Stretch at Wall  - 1 x daily - 7 x weekly - 1 sets - 2 reps - 1 min hold - Seated Wrist Flexion with Dumbbell  - 1 x daily - 7 x weekly - 2 sets - 10 reps - Seated Eccentric Wrist Extension  - 1 x daily - 7 x weekly - 3 sets - 10 reps - Seated Wrist Supination Pronation with Can  - 1 x daily - 7 x weekly - 1 sets - 10 reps - Seated Single Arm Elbow Flexion with Dumbbell  - 1 x daily - 7 x weekly - 3 sets - 10 reps - Seated Wrist Flexion Stretch  - 1 x daily - 7 x weekly - 1 sets - 2 reps - 1 min hold  ASSESSMENT:  CLINICAL IMPRESSION: Patient has no pain with median nerve tension testing.   Radial is still uncomfortable, but less so.  She is seeing improvements in her pain.  She is pleased with progress so far.  She is not as tender to touch over the R lateral epicondyle and extensor tendon.  Strength with wrist extension is progressing without flaring up symptoms.  Gripping is still painful for her and we are working on neutral  hand positioning with all exercise without wrist extension bias.    PT remains necessary for strength, ROM, HEP, functional activity w/ gripping deficits.    [name] [name] is a 53 y.o. female who was referred to physical therapy for evaluation and treatment for RUE pain/weakness/numbness  Patient reports onset of R lateral elbow  and forearm pain beginning 2 months ago for no apparent reason. Pain is worse with fully bending or fully extending her R elbow.  She is very tender to palpate over the lateral epicondyle and common extensor tendon.  She has also positive nerve tension tests for the radial, ulnar, and median nerves.  Her dorsal arm pain is also influenced by shoulder rotational movements.  Cervical spine does not seem to be involved.   Differential includes thoracic outlet, peripheral nerve pathology/entrapment, lateral epicondylitis, or a combo of them.  She does have h/o radiation and lymph node removal for R breast CA so brachial plexus pathology could be contributing to her symptoms as well.  Patient has deficits in R elbow ROM, RUE flexibility, RUE strength, with pain and TTP of the R lateral elbow and dorsal forearm which are interfering with ADLs and are impacting quality of life.  On QuickDash patient scored 38% demonstrating moderate functional limitation of the RUE.  Adalay will benefit from skilled PT to address above deficits to improve mobility and activity tolerance with decreased pain interference.  OBJECTIVE IMPAIRMENTS: decreased ROM, decreased strength, impaired flexibility, impaired UE functional use, and pain.   ACTIVITY LIMITATIONS: carrying, lifting, reach over head, and hygiene/grooming  PARTICIPATION LIMITATIONS: meal prep, cleaning, laundry, community activity, and occupation  PERSONAL FACTORS: Age, Fitness, and Time since onset of injury/illness/exacerbation are also affecting patient's functional outcome.   REHAB POTENTIAL: Good  CLINICAL DECISION MAKING: Evolving/moderate complexity  EVALUATION COMPLEXITY: Moderate   GOALS: Goals reviewed with patient? Yes  SHORT TERM GOALS: Target date: 07/22/2024   Patient will be independent with initial HEP to improve outcomes and carryover.  Baseline: 100% PT assist required for correct completion Goal status: MET- 07/13/24  2.  Patient will  report 25% improvement in neck pain to improve QOL.  Baseline: 8/10 worst 07/17/24:  3/10 today Goal status: IN PROGRESS   LONG TERM GOALS: Target date: 08/19/2024   Patient will be independent with ongoing/advanced HEP for self-management at home.  Baseline: no advanced HEP yet 07/16/24:  continuing to advance w/ resistance and types of exercise Goal status: IN PROGRESS  3.  Patient will report 50-75% improvement in neck pain to improve QOL.  Baseline: 8/10 worst 07/17/24:  3/10 today Goal status: IN PROGRESS   5.  Patient will demonstrate full pain free R elbow flexion and extension ROM to be able to do ADL, housework, and job unhindered.  Baseline: Refer to above UE ROM table Goal status: IN PROGRESS- 07/21/24- lacking full extension today, flexion WNL  6.  Patient will report </= 20% on QuickDAsh (MCID = 14%) to demonstrate improved functional ability.  Baseline: 38% Goal status: INITIAL  7.  Patient will demonstrate 5/5 strength throughout the RUE to be able to do functional tasks without danger of dropping objects or causing self injury   Baseline: see MMT tables above Goal status: INITIAL   PLAN:  PT FREQUENCY: 1-2x/week  PT DURATION: 8 weeks  PLANNED INTERVENTIONS: 97164- PT Re-evaluation, 97110-Therapeutic exercises, 97530- Therapeutic activity, [id number]- Neuromuscular re-education, 97535- Self Care, [id number]- Manual therapy, [id number]- Aquatic Therapy, [id number]- Electrical stimulation (unattended), 97016- Vasopneumatic device, [id number]- Ultrasound, [id number]- Traction (  mechanical), 02966- Ionotophoresis 4mg /ml Dexamethasone , 79439 (1-2 muscles), 20561 (3+ muscles)- Dry Needling, Patient/Family education, Taping, Joint mobilization, Spinal mobilization, Cryotherapy, and Moist heat  PLAN FOR NEXT SESSION: Check strength of RUE soon; Continue to work on strengthening with wrist extension eccentrics, postural strengthening; MFR to CET, US  PRN  Omkar Stratmann, PT 07/23/2024, 5:10  PM

## 2024-07-24 ENCOUNTER — Encounter: Payer: Self-pay | Admitting: Hematology

## 2024-07-27 ENCOUNTER — Ambulatory Visit

## 2024-07-27 ENCOUNTER — Encounter (HOSPITAL_BASED_OUTPATIENT_CLINIC_OR_DEPARTMENT_OTHER): Payer: Self-pay | Admitting: General Surgery

## 2024-07-27 ENCOUNTER — Other Ambulatory Visit: Payer: Self-pay

## 2024-07-27 DIAGNOSIS — M6281 Muscle weakness (generalized): Secondary | ICD-10-CM | POA: Diagnosis not present

## 2024-07-27 DIAGNOSIS — M25621 Stiffness of right elbow, not elsewhere classified: Secondary | ICD-10-CM | POA: Diagnosis not present

## 2024-07-27 DIAGNOSIS — M25521 Pain in right elbow: Secondary | ICD-10-CM

## 2024-07-27 NOTE — Therapy (Signed)
 OUTPATIENT PHYSICAL THERAPY CERVICAL/UE TREATMENT   Patient Name: [name] [name] MRN: [id number] DOB:1971-05-09, 53 y.o., female Today's Date: 07/27/2024   END OF SESSION:  PT End of Session - 07/27/24 0805     Visit Number 8    Date for Recertification  08/19/24    PT Start Time 0800    PT Stop Time 0846    PT Time Calculation (min) 46 min    Activity Tolerance Patient tolerated treatment well;No increased pain    Behavior During Therapy WFL for tasks assessed/performed               Past Medical History:  Diagnosis Date   Allergy    Dust mites, ragweed, grass   Breast cancer (HCC)    Breast cancer (HCC)    CHF (congestive heart failure) (HCC)    Family history of breast cancer    Family history of prostate cancer    GERD (gastroesophageal reflux disease)    during pregnancy    Hypertension    Pneumonia    hx of pneumonia 08/2013    S/P radiation therapy 05/03/2015 through 06/14/2015                                                      Right breast 4680 cGy in 26 sessions, right breast boost 1000 cGy in 5 sessions.  Right supraclavicular/axillary region 4680 cGy with a supplemental PA field to bring the axillary dose up to 4500 cGy in 26 sessions   Past Surgical History:  Procedure Laterality Date   BREAST LUMPECTOMY WITH RADIOACTIVE SEED AND SENTINEL LYMPH NODE BIOPSY Right 03/29/2015   Procedure: BREAST LUMPECTOMY WITH RADIOACTIVE SEED AND SENTINEL LYMPH NODE BIOPSY;  Surgeon: Jina Nephew, MD;  Location: [facility];  Service: General;  Laterality: Right;   BREAST SURGERY     CESAREAN SECTION     CHOLECYSTECTOMY     ESSURE TUBAL LIGATION     PORTACATH PLACEMENT Left 07/21/2014   Procedure: INSERTION PORT-A-CATH;  Surgeon: Jina Nephew, MD;  Location: [facility];  Service: General;  Laterality: Left;   WISDOM TOOTH EXTRACTION     Patient Active Problem List   Diagnosis Date Noted   Abnormal endometrial ultrasound 03/16/2024   Abnormal  uterine bleeding (AUB) 03/16/2024   Other fatigue 03/20/2023   Hyperglycemia 03/20/2023   Family history of liver cancer 03/20/2023   Genetic testing 08/10/2021   Family history of prostate cancer 07/27/2021   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   Seborrheic dermatitis of scalp 06/29/2021   Prediabetes 12/27/2020   Benign essential hypertension 11/12/2019   Dysmenorrhea 12/12/2018   Port-A-Cath in place 08/21/2018   Vitamin D deficiency 07/15/2018   Candidiasis of breast 04/05/2016   Eczema 04/05/2016   Port-A-Cath in place 12/06/2015   Seasonal allergies 10/04/2015   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   Peripheral neuropathy due to chemotherapy 03/15/2015   Breast cancer metastasized to lung (HCC) 07/22/2014   Family history of breast cancer 07/22/2014   Lung mas bilaterial 07/22/2014    PCP: Colette Torrence CINDERELLA, MD   REFERRING PROVIDER: Lanny Callander, MD   REFERRING DIAG: C50.911,C78.00 (ICD-10-CM) - 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 [facility] 2)  THERAPY DIAG:  Pain in right elbow  Stiffness of right elbow, not elsewhere classified  Muscle weakness (generalized)  RATIONALE FOR  EVALUATION AND TREATMENT: Rehabilitation  ONSET DATE: 2015  NEXT MD VISIT:    SUBJECTIVE:                                                                                                                                                                                                         SUBJECTIVE STATEMENT: Pt reports her pain is getting better  53 y/o patient referred to PT for RUE pain/weakness/numbness from her oncologist.   Patient was dx'd with R breast CA with mets to lung in 2015 and had lumpectomy, chemo, and radiation.  Radiation was to R anterior lung and she had axillary lymph node dissection.   Her scans have showed no evidence of disease for the following years since, but she does have R lung scarring.  Oncology note states patient is likely cured and she has stopped all  medications.   Patient symptoms are pain in the R dorsal arm from just above her elbow almost to the wrist.  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   States she has never had the neuropathy advance more proximally than her fingertips.  States this pain is different than her finger neuropathy in that it is much sharper and different location.   She states the pain is constant of varying intensity.   Pain started 2 months ago for no apparent reason.   She denies any trauma, falls,  or changes in her activity level.   She is not involved in any sports activities.  Pain is over the R elbow and dorsal forearm.     Straightening her R elbow increases pain.   Full elbow flexion and reaching behind her back or head also increases pain.  Holding the elbow in midrange feels best.  Works at a desk all day and is using her mouse constantly.    Hand dominance: Right  PAIN: Are you having pain? Yes: NPRS scale: 0/10 now; 2/10 best; 8/10 worst Pain location: R dorsal forearm to just above the elbow Pain description: shooting, stabbing Aggravating factors: fully straightening her elbow or fully bending the elbow Relieving factors: keeping her elbow in mid range positions  PERTINENT HISTORY:  Chemo induced peripheral neuropathy, HTN, GERD  PRECAUTIONS: Other: L breast CA lymphedema precautions  RED FLAGS: None  HAND DOMINANCE: Right  WEIGHT BEARING RESTRICTIONS: No  FALLS:  Has patient fallen in last 6 months? 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   RADIATION DOSE REDUCTION: This exam was performed according to the departmental dose-optimization program which includes automated exposure control, adjustment of the mA and/or kV according to patient size and/or use of iterative reconstruction technique.   CONTRAST:  OMNIPAQUE  IOHEXOL  300 MG/ML  SOLN   COMPARISON:  04/03/2023   FINDINGS: CT CHEST FINDINGS   Cardiovascular: Left chest port catheter. Normal heart size. No pericardial effusion.   Mediastinum/Nodes: No enlarged mediastinal, hilar, or axillary lymph nodes. Thyroid  gland, trachea, and esophagus demonstrate no significant findings.   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 Irregular scarring in the suprahilar left upper lobe (series 4, image 51). No pleural effusion or pneumothorax.   Musculoskeletal: Status post lower inner right lumpectomy and right axillary lymph node dissection. No acute osseous findings.   CT ABDOMEN PELVIS FINDINGS   Hepatobiliary: No focal liver abnormality is seen. Hepatic steatosis. Status post cholecystectomy. No biliary dilatation.   Pancreas: Unremarkable. No pancreatic ductal dilatation or surrounding inflammatory changes.   Spleen: Normal in size without significant abnormality.   Adrenals/Urinary Tract: Adrenal glands are unremarkable. Small nonobstructive right renal calculi. No left-sided calculi, ureteral calculi, or hydronephrosis. Bladder is unremarkable.   Stomach/Bowel: Stomach is within normal limits. Appendix appears normal. No evidence of bowel wall thickening, distention,  or inflammatory changes. Pancolonic diverticulosis.   Vascular/Lymphatic: No significant vascular findings are present. No enlarged abdominal or pelvic lymph nodes.   Reproductive: No mass or other abnormality.  Tubal ligation devices.   Other: Small fat containing umbilical hernia.  No ascites.   Musculoskeletal: No acute osseous findings. Gentle S shaped scoliosis of the thoracolumbar spine.   IMPRESSION: 1. Status post lower inner right lumpectomy and right axillary lymph node dissection. 2. 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 Irregular scarring in the suprahilar left upper lobe. 3. No evidence of lymphadenopathy or metastatic disease in the chest, abdomen, or pelvis. 4. Hepatic steatosis. 5. Small nonobstructive right renal calculi. 6. Pancolonic diverticulosis without evidence of acute diverticulitis.     Electronically Signed   By: Marolyn JONETTA Jaksch M.D.   On: 01/30/2024 10:36  PATIENT SURVEYS:  Quick Dash:  QUICK DASH  Please rate your ability do the following activities in the last week by selecting the number below the appropriate response.   Activities Rating  Open a tight or new jar.  2 = Mild difficulty  Do heavy household chores (e.g., wash walls, floors). 5 = Unable  Carry a shopping bag or briefcase 2 = Mild difficulty  Wash your back. 3 = Moderate difficulty  Use a knife to cut food. 3 = Moderate difficulty  Recreational activities in which you take some force or impact through your arm, shoulder or hand (e.g., golf, hammering, tennis, etc.). 2 = Mild difficulty  During the past week, to what extent has your arm, shoulder or hand problem interfered with your  normal social activities with family, friends, neighbors or groups?  1 = Not at all  During the past week, were you limited in your work or other regular daily activities as a result of your arm, shoulder or hand problem? 1 = Not limited at all  Rate the severity of the following  symptoms in the last week: Arm, Shoulder, or hand pain. 3 = Moderate  Rate the severity of the following symptoms in the last week: Tingling (pins and needles) in your arm, shoulder or hand. 3 = Moderate  During the past week, how much difficulty have you had sleeping because of the pain in your arm, shoulder or hand?  3 = Moderate difficulty   (A QuickDASH score may not be calculated if there is greater than 1 missing item.)  Quick Dash Disability/Symptom Score: [(sum of 28  (n) responses/11 (n)] x 25 = 38.63   Minimally Clinically Important Difference (MCID): 15-20 points  (Franchignoni, F. et al. (2013). Minimally clinically important difference of the disabilities of the arm, shoulder, and hand outcome measures (DASH) and its shortened version (Quick DASH). Journal of Orthopaedic & Sports Physical Therapy, 44(1), 30-39)   COGNITION: Overall cognitive status: Within functional limits for tasks assessed  SENSATION: WFL  POSTURE:  No Significant postural limitations  PALPATION: Very TTP just above the R lateral elbow, over the epicondyle, common extensor tendon   CERVICAL ROM: all cervical motions are pain free and do not affect the R elbow pain  Active ROM A/PROM  eval  Flexion 100%  Extension 65%  Right lateral flexion 75%  Left lateral flexion 50%  Right rotation 90%  Left rotation 90%   (Blank rows = not tested)  UPPER EXTREMITY ROM:  Active ROM Right eval Left eval R 07/21/24  Shoulder flexion 160; p! 180   Shoulder extension     Shoulder abduction 150; p! 180   Shoulder adduction     Shoulder internal rotation L5; p! T12   Shoulder external rotation At side: 70 deg no pain;   at 90: 50 deg with pain 90 at side and at 90   Elbow flexion Full and equal to LUE, but painful  150  Elbow extension -15; pain 0 5 deg- hard stretch  Wrist flexion 70; some pain 75   Wrist extension 90; no increased pain 90   Wrist ulnar deviation     Wrist radial deviation     Wrist  pronation Full and equal BUE    Wrist supination Full and equal BUE     (Blank rows = not tested)  UPPER EXTREMITY MMT:  MMT Right eval Left eval R 07/27/24  Shoulder flexion 4; limited by elbow p! 5 4+  Shoulder extension     Shoulder abduction 4; limited by elbow p! 5 4  Shoulder adduction     Shoulder internal rotation 5 5   Shoulder external rotation 4; elbow p! 5 4+  Middle trapezius     Lower trapezius     Elbow flexion 4; p! 5 5  Elbow extension 4; p! 5 5  Wrist flexion 5 5   Wrist extension 4+; some pain 5 5  Wrist ulnar deviation     Wrist radial deviation     Wrist pronation 4+; some p! 5 5  Wrist supination 4-; lots pain 5 4  Grip strength Weaker due to pain  40lb   (Blank rows = not tested)  CERVICAL SPECIAL TESTS:  Spurling's test: Negative and Distraction test:  Negative   ULTT is positive for radial, ulnar, and median nerves Hand grip is good strength, a little painful  Resisted pronation is good a little painful  Resisted supination is very painful and weak  Finger abduction strength is equal BLE  Thoracic outlet tests are difficult to complete due to increased pain with straightening her elbow  Tinel's testing is negative over all R elbow bony prominences and over the ulnar nerve notch (she denies any falls or trauma to R elbow)  FUNCTIONAL TESTS:  NA     TODAY'S TREATMENT:  07/27/24 THERAPEUTIC EXERCISE: To improve strength and endurance.  Demonstration, verbal and tactile cues throughout for technique. UBE L 2.0 x  3'F/3'B Tested UE strength THERAPEUTIC ACTIVITIES: To improve functional performance.  Demonstration, verbal and tactile cues throughout for technique. Standing R shoulder flexion YTB 2 x 10 Standing R shoulder flexion YTB 2 x 10 Standing B ER YTB 2x10 Standing bicep curl pronated 5lb x 20 Standing wrist flexion arm behind back 5lb x 20 Velcrow board 3x down and back with drumstick handle; then three finger pinch  Lat pulldowns 25lb x  20 wide grips MODALITIES: Continuous US  100% x 1 Mhz x 1.4 w/cm2 x 8' to R common extensor tendon w/ medium sound head  07/23/24 THERAPEUTIC EXERCISE: To improve strength and endurance.  Demonstration, verbal and tactile cues throughout for technique. UBE L 2.5 x  3'F/3'B  NEUROMUSCULAR RE-EDUCATION: To improve kinesthesia and proprioception. Radial nerve glides  Cobra x 20 RUE  Cross arm/grip w/ shoulder flexion x 10 BUE  THERAPEUTIC ACTIVITIES: To improve functional performance.  Demonstration, verbal and tactile cues throughout for technique. Velcro roll pronation/supination in wrist neutral position x 3 back and forth RUE Hammer pronate/supine x 10 RUE Wrist flexion 3# x 20 Wrist ext 3# x 20 w/ controlled eccentric phase Standing elbow flexion 3# supinated wrist x 2/10;  pronated wrist x 2/10 RUE Seated row 15# x 2/10  Seated lat PD 20# x 2/10  Seated R UT stretch x 1'  MODALITIES: Continuous US  100% x 1 Mhz x 1.4 w/cm2 x 8' to R common extensor tendon w/ medium sound head  07/21/24 THERAPEUTIC EXERCISE: To improve strength and endurance.  Demonstration, verbal and tactile cues throughout for technique. UBE L1.0 3 min each way Measured elbow ROM- see table Standing R wrist flexion stretch 2x30'  Seated rows 20lb 2x10 low grip Standing lat pull down 20lb x 20 Standing elbow 90 deg wrist flexion 2 x 10 Standing elbow 90 deg wrist extension 2 x 10 Standing R bicep curls 3lb 2 x 10; pronated curls 2x10 3lb  MODALITIES: Continuous US  100% x 1 Mhz x 1.4 w/cm2 x 8' to R common extensor tendon w/ medium sound head  07/16/24 THERAPEUTIC EXERCISE: To improve strength and endurance.  Demonstration, verbal and tactile cues throughout for technique. UBE L0 x 3'F/3'B  THERAPEUTIC ACTIVITIES: To improve functional performance.  Demonstration, verbal and tactile cues throughout for technique. Wrist curl 2# x 20 RUE Wrist extension 2# w/ slow eccentric phase x 20 RUE Wrist  pronation/supination 2# x 20 RUE Bicep curl in supination 2# x 20 RUE Wrist flexion stretch x 1' x 2 RUE Rowing low grip 15# x 2/10 BUE  MODALITIES: Continuous US  100% x 1 Mhz x 1.4 w/cm2 x 8' to R common extensor tendon w/ medium sound head  MANUAL THERAPY: To promote reduced pain utilizing kinesiotaping. KT tape w/ star pattern for space correction to common extensor tendon RUE  07/13/24 THERAPEUTIC EXERCISE: To improve strength and endurance.  Demonstration, verbal and tactile cues throughout for technique. UBE L1 x 3'F/3'B Wrist curls 2lb 2 x 10 Pronation/supination 2lb 2  x 10 Wrist extension 2lb 2 x 10 Rows 15lb low grip 2 x 10  MODALITIES: Continuous US  100% x 1 Mhz x 1.2 w/cm2 x 8' to R common extensor tendon w/ medium sized sound head  CP to R CET and wrist extensors x 5 min  07/08/24 THERAPEUTIC EXERCISE: To improve strength and endurance.  Demonstration, verbal and tactile cues throughout for technique. UBE L0 x 3'F/3'B  THERAPEUTIC ACTIVITIES: To improve functional performance.  Demonstration, verbal and tactile cues throughout for technique. Wrist curls 2# x 20 Wrist extension w/ eccentric lowering 0# x 20 Radial glides:  Hands crossed and clasped w/ shoulder flexion  Waiters glide  Cobra glide Wrist flexion stretch at wall x 1' x 3 RUE  MODALITIES: Continuous US  100% x 1 Mhz x 1.4 w/cm2 x 8' to R common extensor tendon w/ large sound head  MANUAL THERAPY: To promote reduced pain utilizing therapeutic massage and myofascial release. MFR w/ US  gel to common extensor tendon R elbow; KT tape to extensor tendon w/ 1 I strip longitudinally and 1 short I strip across at the CET  07/01/24 THERAPEUTIC EXERCISE: To improve strength, endurance, ROM, and flexibility.  UBE 3 min fwd/ 3 min back Seated L wrist extension 1lb x 20 Seated L forearm pronation/supination x 20 1lb Seated green theraputty squeeze x 12; three finger pinch for fine motor skill x 10 MANUAL  THERAPY: To promote normalized muscle tension, improve joint mobility and/or for pain modulation  Massage gun to R wrist extensors close to CET near elbow Ionto Patch (#1 of 6) to R elbow - Dexamethasone  1 mL, 80 mA-min, 4-6 hr wear time  06/24/24 SELF CARE: Provided education on PT POC progression., initial HEP, tennis elbow strap, using ice for pain relief, ionto education handout;  self massage of the lateral epicondyle  Ionto Patch (#1 of 6) to R elbow - Dexamethasone  1 mL, 80 mA-min, 4-6 hr wear time    PATIENT EDUCATION:  Education details: HEP review and HEP update  Person educated: Patient Education method: Explanation, Demonstration, Verbal cues, Tactile cues, Handouts, and MedBridgeGO app access provided Education comprehension: verbalized understanding, verbal cues required, tactile cues required, and needs further education  HOME EXERCISE PROGRAM: Access Code: [email or web address] URL: [email or web address] Date: 07/16/2024 Prepared by: Garnette Montclair  Exercises - Radial Nerve Gliding: Waiter's Tip  - 1 x daily - 7 x weekly - 3 sets - 10 reps - Standing Radial Nerve Glide  - 1 x daily - 7 x weekly - 2 sets - 10 reps - 3 sec hold - Radial Nerve Gliding: Waiter's Tip  - 1 x daily - 7 x weekly - 2 sets - 10 reps - 3 sec hold - Radial Nerve Cross-Over  - 1 x daily - 7 x weekly - 1 sets - 10 reps - 3 sec hold - Wrist Extension Stretch at Wall  - 1 x daily - 7 x weekly - 1 sets - 2 reps - 1 min hold - Seated Wrist Flexion with Dumbbell  - 1 x daily - 7 x weekly - 2 sets - 10 reps - Seated Eccentric Wrist Extension  - 1 x daily - 7 x weekly - 3 sets - 10 reps - Seated Wrist Supination Pronation with Can  - 1 x daily - 7 x weekly -  1 sets - 10 reps - Seated Single Arm Elbow Flexion with Dumbbell  - 1 x daily - 7 x weekly - 3 sets - 10 reps - Seated Wrist Flexion Stretch  - 1 x daily - 7 x weekly - 1 sets - 2 reps - 1 min hold  ASSESSMENT:  CLINICAL IMPRESSION: Pt was  able to complete interventions today. Continued progressing as tolerated. US  continues to address inflammation over CET and wrist extensors. Strength has improved overall, with some weakness remaining in shoulder flex, abd, ER, and supination. PT remains necessary for strength, ROM, HEP, functional activity w/ gripping deficits.    [name] [name] is a 53 y.o. female who was referred to physical therapy for evaluation and treatment for RUE pain/weakness/numbness  Patient reports onset of R lateral elbow and forearm pain beginning 2 months ago for no apparent reason. Pain is worse with fully bending or fully extending her R elbow.  She is very tender to palpate over the lateral epicondyle and common extensor tendon.  She has also positive nerve tension tests for the radial, ulnar, and median nerves.  Her dorsal arm pain is also influenced by shoulder rotational movements.  Cervical spine does not seem to be involved.   Differential includes thoracic outlet, peripheral nerve pathology/entrapment, lateral epicondylitis, or a combo of them.  She does have h/o radiation and lymph node removal for R breast CA so brachial plexus pathology could be contributing to her symptoms as well.  Patient has deficits in R elbow ROM, RUE flexibility, RUE strength, with pain and TTP of the R lateral elbow and dorsal forearm which are interfering with ADLs and are impacting quality of life.  On QuickDash patient scored 38% demonstrating moderate functional limitation of the RUE.  Ahley will benefit from skilled PT to address above deficits to improve mobility and activity tolerance with decreased pain interference.  OBJECTIVE IMPAIRMENTS: decreased ROM, decreased strength, impaired flexibility, impaired UE functional use, and pain.   ACTIVITY LIMITATIONS: carrying, lifting, reach over head, and hygiene/grooming  PARTICIPATION LIMITATIONS: meal prep, cleaning, laundry, community activity, and occupation  PERSONAL FACTORS:  Age, Fitness, and Time since onset of injury/illness/exacerbation are also affecting patient's functional outcome.   REHAB POTENTIAL: Good  CLINICAL DECISION MAKING: Evolving/moderate complexity  EVALUATION COMPLEXITY: Moderate   GOALS: Goals reviewed with patient? Yes  SHORT TERM GOALS: Target date: 07/22/2024   Patient will be independent with initial HEP to improve outcomes and carryover.  Baseline: 100% PT assist required for correct completion Goal status: MET- 07/13/24  2.  Patient will report 25% improvement in neck pain to improve QOL.  Baseline: 8/10 worst 07/17/24:  3/10 today Goal status: IN PROGRESS   LONG TERM GOALS: Target date: 08/19/2024   Patient will be independent with ongoing/advanced HEP for self-management at home.  Baseline: no advanced HEP yet 07/16/24:  continuing to advance w/ resistance and types of exercise Goal status: IN PROGRESS  3.  Patient will report 50-75% improvement in neck pain to improve QOL.  Baseline: 8/10 worst 07/17/24:  3/10 today Goal status: IN PROGRESS   5.  Patient will demonstrate full pain free R elbow flexion and extension ROM to be able to do ADL, housework, and job unhindered.  Baseline: Refer to above UE ROM table Goal status: IN PROGRESS- 07/21/24- lacking full extension today, flexion WNL  6.  Patient will report </= 20% on QuickDAsh (MCID = 14%) to demonstrate improved functional ability.  Baseline: 38% Goal status: INITIAL  7.  Patient will demonstrate 5/5 strength throughout the RUE to be able to do functional tasks without danger of dropping objects or causing self injury   Baseline: see MMT tables above Goal status: IN PROGRESS- 07/27/24 see table, improving   PLAN:  PT FREQUENCY: 1-2x/week  PT DURATION: 8 weeks  PLANNED INTERVENTIONS: 97164- PT Re-evaluation, 97110-Therapeutic exercises, 97530- Therapeutic activity, 97112- Neuromuscular re-education, 97535- Self Care, [id number]- Manual therapy, [id number]- Aquatic  Therapy, H9716- Electrical stimulation (unattended), 97016- Vasopneumatic device, [id number]- Ultrasound, [id number]- Traction (mechanical), [id number]- Ionotophoresis 4mg /ml Dexamethasone , 79439 (1-2 muscles), 20561 (3+ muscles)- Dry Needling, Patient/Family education, Taping, Joint mobilization, Spinal mobilization, Cryotherapy, and Moist heat  PLAN FOR NEXT SESSION: Check strength of RUE soon; Continue to work on strengthening with wrist extension eccentrics, postural strengthening; MFR to CET, US  PRN  Sol LITTIE Gaskins, PTA 07/27/2024, 8:46 AM

## 2024-07-28 ENCOUNTER — Encounter (HOSPITAL_BASED_OUTPATIENT_CLINIC_OR_DEPARTMENT_OTHER)
Admission: RE | Admit: 2024-07-28 | Discharge: 2024-07-28 | Disposition: A | Source: Ambulatory Visit | Attending: General Surgery

## 2024-07-28 DIAGNOSIS — Z01812 Encounter for preprocedural laboratory examination: Secondary | ICD-10-CM | POA: Diagnosis not present

## 2024-07-28 DIAGNOSIS — Z01818 Encounter for other preprocedural examination: Secondary | ICD-10-CM | POA: Diagnosis not present

## 2024-07-28 DIAGNOSIS — N6489 Other specified disorders of breast: Secondary | ICD-10-CM | POA: Diagnosis not present

## 2024-07-28 DIAGNOSIS — Z452 Encounter for adjustment and management of vascular access device: Secondary | ICD-10-CM | POA: Diagnosis not present

## 2024-07-28 DIAGNOSIS — I10 Essential (primary) hypertension: Secondary | ICD-10-CM | POA: Diagnosis not present

## 2024-07-28 DIAGNOSIS — C799 Secondary malignant neoplasm of unspecified site: Secondary | ICD-10-CM | POA: Diagnosis not present

## 2024-07-28 DIAGNOSIS — C50919 Malignant neoplasm of unspecified site of unspecified female breast: Secondary | ICD-10-CM | POA: Diagnosis not present

## 2024-07-28 DIAGNOSIS — Z01810 Encounter for preprocedural cardiovascular examination: Secondary | ICD-10-CM | POA: Diagnosis not present

## 2024-07-28 LAB — BASIC METABOLIC PANEL WITH GFR
Anion gap: 8 (ref 5–15)
BUN: 10 mg/dL (ref 6–20)
CO2: 29 mmol/L (ref 22–32)
Calcium: 9.6 mg/dL (ref 8.9–10.3)
Chloride: 102 mmol/L (ref 98–111)
Creatinine, Ser: 0.92 mg/dL (ref 0.44–1.00)
GFR, Estimated: >60 mL/min (ref >60)
Glucose, Bld: 108 mg/dL — ABNORMAL HIGH (ref 70–99)
Potassium: 4.7 mmol/L (ref 3.5–5.1)
Sodium: 138 mmol/L (ref 135–145)

## 2024-07-28 MED ORDER — CHLORHEXIDINE GLUCONATE CLOTH 2 % EX PADS: 6.0000 | MEDICATED_PAD | Freq: Once | CUTANEOUS | Status: DC

## 2024-07-28 NOTE — Progress Notes (Signed)

## 2024-07-30 ENCOUNTER — Encounter: Payer: Self-pay | Admitting: Rehabilitation

## 2024-07-30 ENCOUNTER — Ambulatory Visit: Admitting: Rehabilitation

## 2024-07-30 DIAGNOSIS — M25521 Pain in right elbow: Secondary | ICD-10-CM

## 2024-07-30 DIAGNOSIS — M6281 Muscle weakness (generalized): Secondary | ICD-10-CM | POA: Diagnosis not present

## 2024-07-30 DIAGNOSIS — M25621 Stiffness of right elbow, not elsewhere classified: Secondary | ICD-10-CM | POA: Diagnosis not present

## 2024-07-30 NOTE — Therapy (Signed)
 OUTPATIENT PHYSICAL THERAPY CERVICAL/UE TREATMENT   Patient Name: [name] [name] MRN: [id number] DOB:02/08/1971, 53 y.o., female Today's Date: 07/30/2024   END OF SESSION:  PT End of Session - 07/30/24 0804     Visit Number 9    Date for Recertification  08/19/24    PT Start Time 0801    PT Stop Time 0847    PT Time Calculation (min) 46 min    Activity Tolerance Patient tolerated treatment well;No increased pain    Behavior During Therapy WFL for tasks assessed/performed               Past Medical History:  Diagnosis Date   Allergy    Dust mites, ragweed, grass   Breast cancer (HCC)    Breast cancer (HCC)    CHF (congestive heart failure) (HCC)    Family history of breast cancer    Family history of prostate cancer    GERD (gastroesophageal reflux disease)    during pregnancy    Hypertension    Pneumonia    hx of pneumonia 08/2013    S/P radiation therapy 05/03/2015 through 06/14/2015                                                      Right breast 4680 cGy in 26 sessions, right breast boost 1000 cGy in 5 sessions.  Right supraclavicular/axillary region 4680 cGy with a supplemental PA field to bring the axillary dose up to 4500 cGy in 26 sessions   Past Surgical History:  Procedure Laterality Date   BREAST LUMPECTOMY WITH RADIOACTIVE SEED AND SENTINEL LYMPH NODE BIOPSY Right 03/29/2015   Procedure: BREAST LUMPECTOMY WITH RADIOACTIVE SEED AND SENTINEL LYMPH NODE BIOPSY;  Surgeon: Jina Nephew, MD;  Location: [facility];  Service: General;  Laterality: Right;   BREAST SURGERY     CESAREAN SECTION     CHOLECYSTECTOMY     ESSURE TUBAL LIGATION     PORTACATH PLACEMENT Left 07/21/2014   Procedure: INSERTION PORT-A-CATH;  Surgeon: Jina Nephew, MD;  Location: [facility];  Service: General;  Laterality: Left;   WISDOM TOOTH EXTRACTION     Patient Active Problem List   Diagnosis Date Noted   Abnormal endometrial ultrasound 03/16/2024   Abnormal  uterine bleeding (AUB) 03/16/2024   Other fatigue 03/20/2023   Hyperglycemia 03/20/2023   Family history of liver cancer 03/20/2023   Genetic testing 08/10/2021   Family history of prostate cancer 07/27/2021   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   Seborrheic dermatitis of scalp 06/29/2021   Prediabetes 12/27/2020   Benign essential hypertension 11/12/2019   Dysmenorrhea 12/12/2018   Port-A-Cath in place 08/21/2018   Vitamin D deficiency 07/15/2018   Candidiasis of breast 04/05/2016   Eczema 04/05/2016   Port-A-Cath in place 12/06/2015   Seasonal allergies 10/04/2015   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   Peripheral neuropathy due to chemotherapy 03/15/2015   Breast cancer metastasized to lung (HCC) 07/22/2014   Family history of breast cancer 07/22/2014   Lung mas bilaterial 07/22/2014    PCP: Colette Torrence CINDERELLA, MD   REFERRING PROVIDER: Lanny Callander, MD   REFERRING DIAG: C50.911,C78.00 (ICD-10-CM) - 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   She reports 1/10 pain today.   53 y/o patient referred to PT for RUE pain/weakness/numbness from her oncologist.   Patient was dx'd with R breast CA with mets to lung in 2015 and had lumpectomy, chemo, and radiation.  Radiation was to R anterior lung and she had axillary lymph node dissection.   Her scans have showed no evidence of disease for the following years since, but she does have R lung scarring.  Oncology note states patient is likely cured and  she has stopped all medications.   Patient symptoms are pain in the R dorsal arm from just above her elbow almost to the wrist.  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   States she has never had the neuropathy advance more proximally than her fingertips.  States this pain is different than her finger neuropathy in that it is much sharper and different location.   She states the pain is constant of varying intensity.   Pain started 2 months ago for no apparent reason.   She denies any trauma, falls,  or changes in her activity level.   She is not involved in any sports activities.  Pain is over the R elbow and dorsal forearm.     Straightening her R elbow increases pain.   Full elbow flexion and reaching behind her back or head also increases pain.  Holding the elbow in midrange feels best.  Works at a desk all day and is using her mouse constantly.    Hand dominance: Right  PAIN: Are you having pain? Yes: NPRS scale: 0/10 now; 2/10 best; 8/10 worst Pain location: R dorsal forearm to just above the elbow Pain description: shooting, stabbing Aggravating factors: fully straightening her elbow or fully bending the elbow Relieving factors: keeping her elbow in mid range positions  PERTINENT HISTORY:  Chemo induced peripheral neuropathy, HTN, GERD  PRECAUTIONS: Other: L breast CA lymphedema precautions  RED FLAGS: None  HAND DOMINANCE: Right  WEIGHT BEARING RESTRICTIONS: No  FALLS:  Has patient fallen in last 6 months? 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   RADIATION DOSE REDUCTION: This exam was performed according to the departmental dose-optimization program which includes automated exposure control, adjustment of the mA and/or kV according to patient size and/or use of iterative reconstruction technique.   CONTRAST:  OMNIPAQUE  IOHEXOL  300 MG/ML  SOLN   COMPARISON:  04/03/2023   FINDINGS: CT CHEST FINDINGS   Cardiovascular: Left chest port catheter. Normal heart size. No pericardial effusion.   Mediastinum/Nodes: No enlarged mediastinal, hilar, or axillary lymph nodes. Thyroid  gland, trachea, and esophagus demonstrate no significant findings.   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 Irregular scarring in the suprahilar left upper lobe (series 4, image 51). No pleural effusion or pneumothorax.   Musculoskeletal: Status post lower inner right lumpectomy and right axillary lymph node dissection. No acute osseous findings.   CT ABDOMEN PELVIS FINDINGS   Hepatobiliary: No focal liver abnormality is seen. Hepatic steatosis. Status post cholecystectomy. No biliary dilatation.   Pancreas: Unremarkable. No pancreatic ductal dilatation or surrounding inflammatory changes.   Spleen: Normal in size without significant abnormality.   Adrenals/Urinary Tract: Adrenal glands are unremarkable. Small nonobstructive right renal calculi. No left-sided calculi, ureteral calculi, or hydronephrosis. Bladder is unremarkable.   Stomach/Bowel: Stomach is within normal limits. Appendix appears normal. No evidence of bowel wall  thickening, distention, or inflammatory changes. Pancolonic diverticulosis.   Vascular/Lymphatic: No significant vascular findings are present. No enlarged abdominal or pelvic lymph nodes.   Reproductive: No mass or other abnormality.  Tubal ligation devices.   Other: Small fat containing umbilical hernia.  No ascites.   Musculoskeletal: No acute osseous findings. Gentle S shaped scoliosis of the thoracolumbar spine.   IMPRESSION: 1. Status post lower inner right lumpectomy and right axillary lymph node dissection. 2. 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 Irregular scarring in the suprahilar left upper lobe. 3. No evidence of lymphadenopathy or metastatic disease in the chest, abdomen, or pelvis. 4. Hepatic steatosis. 5. Small nonobstructive right renal calculi. 6. Pancolonic diverticulosis without evidence of acute diverticulitis.     Electronically Signed   By: Marolyn JONETTA Jaksch M.D.   On: 01/30/2024 10:36  PATIENT SURVEYS:  Quick Dash:  QUICK DASH  Please rate your ability do the following activities in the last week by selecting the number below the appropriate response.   Activities Rating  Open a tight or new jar.  2 = Mild difficulty  Do heavy household chores (e.g., wash walls, floors). 5 = Unable  Carry a shopping bag or briefcase 2 = Mild difficulty  Wash your back. 3 = Moderate difficulty  Use a knife to cut food. 3 = Moderate difficulty  Recreational activities in which you take some force or impact through your arm, shoulder or hand (e.g., golf, hammering, tennis, etc.). 2 = Mild difficulty  During the past week, to what extent has your arm, shoulder  or hand problem interfered with your normal social activities with family, friends, neighbors or groups?  1 = Not at all  During the past week, were you limited in your work or other regular daily activities as a result of your arm, shoulder or hand problem? 1 = Not limited at all  Rate the  severity of the following symptoms in the last week: Arm, Shoulder, or hand pain. 3 = Moderate  Rate the severity of the following symptoms in the last week: Tingling (pins and needles) in your arm, shoulder or hand. 3 = Moderate  During the past week, how much difficulty have you had sleeping because of the pain in your arm, shoulder or hand?  3 = Moderate difficulty   (A QuickDASH score may not be calculated if there is greater than 1 missing item.)  Quick Dash Disability/Symptom Score: [(sum of 28  (n) responses/11 (n)] x 25 = 38.63   Minimally Clinically Important Difference (MCID): 15-20 points  (Franchignoni, F. et al. (2013). Minimally clinically important difference of the disabilities of the arm, shoulder, and hand outcome measures (DASH) and its shortened version (Quick DASH). Journal of Orthopaedic & Sports Physical Therapy, 44(1), 30-39)   COGNITION: Overall cognitive status: Within functional limits for tasks assessed  SENSATION: WFL  POSTURE:  No Significant postural limitations  PALPATION: Very TTP just above the R lateral elbow, over the epicondyle, common extensor tendon   CERVICAL ROM: all cervical motions are pain free and do not affect the R elbow pain  Active ROM A/PROM  eval  Flexion 100%  Extension 65%  Right lateral flexion 75%  Left lateral flexion 50%  Right rotation 90%  Left rotation 90%   (Blank rows = not tested)  UPPER EXTREMITY ROM:  Active ROM Right eval Left eval R 07/21/24  Shoulder flexion 160; p! 180   Shoulder extension     Shoulder abduction 150; p! 180   Shoulder adduction     Shoulder internal rotation L5; p! T12   Shoulder external rotation At side: 70 deg no pain;   at 90: 50 deg with pain 90 at side and at 90   Elbow flexion Full and equal to LUE, but painful  150  Elbow extension -15; pain 0 5 deg- hard stretch  Wrist flexion 70; some pain 75   Wrist extension 90; no increased pain 90   Wrist ulnar deviation     Wrist  radial deviation     Wrist pronation Full and equal BUE    Wrist supination Full and equal BUE     (Blank rows = not tested)  UPPER EXTREMITY MMT:  MMT Right eval Left eval R 07/27/24 L 07/30/24  Shoulder flexion 4; limited by elbow p! 5 4+   Shoulder extension      Shoulder abduction 4; limited by elbow p! 5 4   Shoulder adduction      Shoulder internal rotation 5 5    Shoulder external rotation 4; elbow p! 5 4+   Middle trapezius      Lower trapezius      Elbow flexion 4; p! 5 5   Elbow extension 4; p! 5 5   Wrist flexion 5 5    Wrist extension 4+; some pain 5 5   Wrist ulnar deviation      Wrist radial deviation      Wrist pronation 4+; some p! 5 5   Wrist supination 4-; lots pain 5 4   Grip strength  Weaker due to pain  40lb 38 lb   (Blank rows = not tested)  CERVICAL SPECIAL TESTS:  Spurling's test: Negative and Distraction test: Negative   ULTT is positive for radial, ulnar, and median nerves Hand grip is good strength, a little painful  Resisted pronation is good a little painful  Resisted supination is very painful and weak  Finger abduction strength is equal BLE  Thoracic outlet tests are difficult to complete due to increased pain with straightening her elbow  Tinel's testing is negative over all R elbow bony prominences and over the ulnar nerve notch (she denies any falls or trauma to R elbow)  FUNCTIONAL TESTS:  NA     TODAY'S TREATMENT:  07/30/24 THERAPEUTIC EXERCISE: To improve strength and endurance.  Demonstration, verbal and tactile cues throughout for technique. UBE L 2.0 x  3'F/3'B  Seated wrist ext 2# x 20 LUE Standing wrist flex/curl 5# w/ shoulder extended x 20 LUE Standing velcro board wrist pronate/supinate w/ long handle roller x 4 LUE Standing reverse bicep curl w/ forearm pronated 5# x 2/10 LUE Standing wrist flexion stretch w/ dorsum of hand on counter behind x 1' x 2 BUE Seated row 20# x 2/10 BUE Lat PD 25# x 2/10 BUE Wall slides  with R wrist in full flexion using dorsum of the hand to slide up the wall x 2/10 RUE  MODALITIES: Continuous US  100% x 1 Mhz x 1.4 w/cm2 x 8' to R common extensor tendon w/ medium sound head  07/27/24 THERAPEUTIC EXERCISE: To improve strength and endurance.  Demonstration, verbal and tactile cues throughout for technique. UBE L 2.0 x  3'F/3'B Tested UE strength THERAPEUTIC ACTIVITIES: To improve functional performance.  Demonstration, verbal and tactile cues throughout for technique. Standing R shoulder flexion YTB 2 x 10 Standing R shoulder flexion YTB 2 x 10 Standing B ER YTB 2x10 Standing bicep curl pronated 5lb x 20 Standing wrist flexion arm behind back 5lb x 20 Velcrow board 3x down and back with drumstick handle; then three finger pinch  Lat pulldowns 25lb x 20 wide grips MODALITIES: Continuous US  100% x 1 Mhz x 1.4 w/cm2 x 8' to R common extensor tendon w/ medium sound head  07/23/24 THERAPEUTIC EXERCISE: To improve strength and endurance.  Demonstration, verbal and tactile cues throughout for technique. UBE L 2.5 x  3'F/3'B  NEUROMUSCULAR RE-EDUCATION: To improve kinesthesia and proprioception. Radial nerve glides  Cobra x 20 RUE  Cross arm/grip w/ shoulder flexion x 10 BUE  THERAPEUTIC ACTIVITIES: To improve functional performance.  Demonstration, verbal and tactile cues throughout for technique. Velcro roll pronation/supination in wrist neutral position x 3 back and forth RUE Hammer pronate/supine x 10 RUE Wrist flexion 3# x 20 Wrist ext 3# x 20 w/ controlled eccentric phase Standing elbow flexion 3# supinated wrist x 2/10;  pronated wrist x 2/10 RUE Seated row 15# x 2/10  Seated lat PD 20# x 2/10  Seated R UT stretch x 1'  MODALITIES: Continuous US  100% x 1 Mhz x 1.4 w/cm2 x 8' to R common extensor tendon w/ medium sound head  07/21/24 THERAPEUTIC EXERCISE: To improve strength and endurance.  Demonstration, verbal and tactile cues throughout for technique. UBE  L1.0 3 min each way Measured elbow ROM- see table Standing R wrist flexion stretch 2x30'  Seated rows 20lb 2x10 low grip Standing lat pull down 20lb x 20 Standing elbow 90 deg wrist flexion 2 x 10 Standing elbow 90 deg wrist extension 2 x 10  Standing R bicep curls 3lb 2 x 10; pronated curls 2x10 3lb  MODALITIES: Continuous US  100% x 1 Mhz x 1.4 w/cm2 x 8' to R common extensor tendon w/ medium sound head  07/16/24 THERAPEUTIC EXERCISE: To improve strength and endurance.  Demonstration, verbal and tactile cues throughout for technique. UBE L0 x 3'F/3'B  THERAPEUTIC ACTIVITIES: To improve functional performance.  Demonstration, verbal and tactile cues throughout for technique. Wrist curl 2# x 20 RUE Wrist extension 2# w/ slow eccentric phase x 20 RUE Wrist pronation/supination 2# x 20 RUE Bicep curl in supination 2# x 20 RUE Wrist flexion stretch x 1' x 2 RUE Rowing low grip 15# x 2/10 BUE  MODALITIES: Continuous US  100% x 1 Mhz x 1.4 w/cm2 x 8' to R common extensor tendon w/ medium sound head  MANUAL THERAPY: To promote reduced pain utilizing kinesiotaping. KT tape w/ star pattern for space correction to common extensor tendon RUE  07/13/24 THERAPEUTIC EXERCISE: To improve strength and endurance.  Demonstration, verbal and tactile cues throughout for technique. UBE L1 x 3'F/3'B Wrist curls 2lb 2 x 10 Pronation/supination 2lb 2  x 10 Wrist extension 2lb 2 x 10 Rows 15lb low grip 2 x 10  MODALITIES: Continuous US  100% x 1 Mhz x 1.2 w/cm2 x 8' to R common extensor tendon w/ medium sized sound head  CP to R CET and wrist extensors x 5 min  07/08/24 THERAPEUTIC EXERCISE: To improve strength and endurance.  Demonstration, verbal and tactile cues throughout for technique. UBE L0 x 3'F/3'B  THERAPEUTIC ACTIVITIES: To improve functional performance.  Demonstration, verbal and tactile cues throughout for technique. Wrist curls 2# x 20 Wrist extension w/ eccentric lowering 0# x  20 Radial glides:  Hands crossed and clasped w/ shoulder flexion  Waiters glide  Cobra glide Wrist flexion stretch at wall x 1' x 3 RUE  MODALITIES: Continuous US  100% x 1 Mhz x 1.4 w/cm2 x 8' to R common extensor tendon w/ large sound head  MANUAL THERAPY: To promote reduced pain utilizing therapeutic massage and myofascial release. MFR w/ US  gel to common extensor tendon R elbow; KT tape to extensor tendon w/ 1 I strip longitudinally and 1 short I strip across at the CET  07/01/24 THERAPEUTIC EXERCISE: To improve strength, endurance, ROM, and flexibility.  UBE 3 min fwd/ 3 min back Seated L wrist extension 1lb x 20 Seated L forearm pronation/supination x 20 1lb Seated green theraputty squeeze x 12; three finger pinch for fine motor skill x 10 MANUAL THERAPY: To promote normalized muscle tension, improve joint mobility and/or for pain modulation  Massage gun to R wrist extensors close to CET near elbow Ionto Patch (#1 of 6) to R elbow - Dexamethasone  1 mL, 80 mA-min, 4-6 hr wear time  06/24/24 SELF CARE: Provided education on PT POC progression., initial HEP, tennis elbow strap, using ice for pain relief, ionto education handout;  self massage of the lateral epicondyle  Ionto Patch (#1 of 6) to R elbow - Dexamethasone  1 mL, 80 mA-min, 4-6 hr wear time    PATIENT EDUCATION:  Education details: HEP review and HEP update  Person educated: Patient Education method: Explanation, Demonstration, Verbal cues, Tactile cues, Handouts, and MedBridgeGO app access provided Education comprehension: verbalized understanding, verbal cues required, tactile cues required, and needs further education  HOME EXERCISE PROGRAM: Access Code: [email or web address] URL: [email or web address] Date: 07/30/2024 Prepared by: Garnette Montclair  Exercises - Standing Radial Nerve Glide  - 1 x daily - 7 x  weekly - 2 sets - 10 reps - 3 sec hold - Radial Nerve Gliding: Waiter's Tip  - 1 x daily - 7 x weekly  - 2 sets - 10 reps - 3 sec hold - Radial Nerve Cross-Over  - 1 x daily - 7 x weekly - 1 sets - 10 reps - 3 sec hold - Seated Wrist Flexion with Dumbbell  - 1 x daily - 7 x weekly - 2 sets - 10 reps - Seated Eccentric Wrist Extension  - 1 x daily - 7 x weekly - 3 sets - 10 reps - Seated Wrist Supination Pronation with Can  - 1 x daily - 7 x weekly - 1 sets - 10 reps - Standing Wrist Flexion Stretch with Table  - 1 x daily - 7 x weekly - 1 sets - 1 reps - 1 min hold - Standing Posterior Wrist Flexion with Barbell  - 1 x daily - 7 x weekly - 3 sets - 10 reps - Standing Pronated Elbow Flexion with Dumbbell  - 1 x daily - 7 x weekly - 3 sets - 10 reps  ASSESSMENT:  CLINICAL IMPRESSION: This is patient's last scheduled visit and she would like to continue with her home program on her own for now.   She has 3 more weeks on her certification orders to return if she needs and is advised to call and schedule with us  if this is the case.   We will hold her chart for 3 weeks and D/C PT if she has not needed to return.    She has made good improvements in her strength and pain levels.     TIEARA FLITTON is a 53 y.o. female who was referred to physical therapy for evaluation and treatment for RUE pain/weakness/numbness  Patient reports onset of R lateral elbow and forearm pain beginning 2 months ago for no apparent reason. Pain is worse with fully bending or fully extending her R elbow.  She is very tender to palpate over the lateral epicondyle and common extensor tendon.  She has also positive nerve tension tests for the radial, ulnar, and median nerves.  Her dorsal arm pain is also influenced by shoulder rotational movements.  Cervical spine does not seem to be involved.   Differential includes thoracic outlet, peripheral nerve pathology/entrapment, lateral epicondylitis, or a combo of them.  She does have h/o radiation and lymph node removal for R breast CA so brachial plexus pathology could be contributing to  her symptoms as well.  Patient has deficits in R elbow ROM, RUE flexibility, RUE strength, with pain and TTP of the R lateral elbow and dorsal forearm which are interfering with ADLs and are impacting quality of life.  On QuickDash patient scored 38% demonstrating moderate functional limitation of the RUE.  Zenya will benefit from skilled PT to address above deficits to improve mobility and activity tolerance with decreased pain interference.  OBJECTIVE IMPAIRMENTS: decreased ROM, decreased strength, impaired flexibility, impaired UE functional use, and pain.   ACTIVITY LIMITATIONS: carrying, lifting, reach over head, and hygiene/grooming  PARTICIPATION LIMITATIONS: meal prep, cleaning, laundry, community activity, and occupation  PERSONAL FACTORS: Age, Fitness, and Time since onset of injury/illness/exacerbation are also affecting patient's functional outcome.   REHAB POTENTIAL: Good  CLINICAL DECISION MAKING: Evolving/moderate complexity  EVALUATION COMPLEXITY: Moderate   GOALS: Goals reviewed with patient? Yes  SHORT TERM GOALS: Target date: 07/22/2024   Patient will be independent with initial HEP to improve outcomes and carryover.  Baseline: 100% PT assist required for correct completion Goal status: MET- 07/13/24  2.  Patient will report 25% improvement in neck pain to improve QOL.  Baseline: 8/10 worst 07/17/24:  3/10 today 07/30/24:  1/10 Goal status: MET   LONG TERM GOALS: Target date: 08/19/2024   Patient will be independent with ongoing/advanced HEP for self-management at home.  Baseline: no advanced HEP yet 07/16/24:  continuing to advance w/ resistance and types of exercise Goal status: IN PROGRESS  3.  Patient will report 50-75% improvement in neck pain to improve QOL.  Baseline: 8/10 worst 07/17/24:  3/10 today 07/30/24:  1/10 Goal status: MET   5.  Patient will demonstrate full pain free R elbow flexion and extension ROM to be able to do ADL, housework, and  job unhindered.  Baseline: Refer to above UE ROM table Goal status: IN PROGRESS- 07/21/24- lacking full extension today, flexion WNL  6.  Patient will report </= 20% on QuickDAsh (MCID = 14%) to demonstrate improved functional ability.  Baseline: 38% Goal status: INITIAL  7.  Patient will demonstrate 5/5 strength throughout the RUE to be able to do functional tasks without danger of dropping objects or causing self injury   Baseline: see MMT tables above Goal status: IN PROGRESS- 07/27/24 see table, improving   PLAN:  PT FREQUENCY: 1-2x/week  PT DURATION: 8 weeks  PLANNED INTERVENTIONS: 97164- PT Re-evaluation, 97110-Therapeutic exercises, 97530- Therapeutic activity, 97112- Neuromuscular re-education, 97535- Self Care, [id number]- Manual therapy, [id number]- Aquatic Therapy, [id number]- Electrical stimulation (unattended), 97016- Vasopneumatic device, [id number]- Ultrasound, [id number]- Traction (mechanical), [id number]- Ionotophoresis 4mg /ml Dexamethasone , 79439 (1-2 muscles), 20561 (3+ muscles)- Dry Needling, Patient/Family education, Taping, Joint mobilization, Spinal mobilization, Cryotherapy, and Moist heat  PLAN FOR NEXT SESSION:  Patient is feeling much better and is going to continue with her home exercises on her own and return to clinic only PRN.   She is pleased with her progress   Onesimo Lingard, PT 07/30/2024, 4:13 PM

## 2024-07-31 NOTE — H&P (Addendum)
 "     Chief Complaint  Patient presents with   New Consultation      Subjective [name] [name] is a 53 y.o. female who presents for New Consultation HPI History of Present Illness [name] [name] is a 53 year old female with an indwelling vascular access port who presents for evaluation and management of port removal.  Patient has a history of metastatic breast cancer.  She is status post right lumpectomy and right axillary lymph node dissection back in 2016.    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 NED since then.     She has had a vascular access port in place for several years, which has recently become less functional. Although the port still flushes, it is no longer usable for its intended purpose. She recalls that port placement was more painful than she expects removal to be, and she anticipates a smoother recovery following removal.   She works from home and does not perform heavy lifting.   She has previously discussed breast asymmetry with Dr. Lanny and considered referral to plastic surgery for possible reduction, but has not yet pursued further evaluation.     CT chest/abd/pel 01/27/24 IMPRESSION: 1. Status post lower inner right lumpectomy and right axillary lymph node dissection. 2. 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 Irregular scarring in the suprahilar left upper lobe. 3. No evidence of lymphadenopathy or metastatic disease in the chest, abdomen, or pelvis. 4. Hepatic steatosis. 5. Small nonobstructive right renal calculi. 6. Pancolonic diverticulosis without evidence of acute diverticulitis.   Review of Systems  All other systems reviewed and are negative.    Problem List     Patient Active Problem List  Diagnosis   Metastasis  from breast cancer (CMS/HHS-HCC)   Port-A-Cath in place   Postoperative breast asymmetry        Medications Prior to Visit        Outpatient Medications Prior to Visit  Medication Sig Dispense Refill   albuterol  MDI, PROVENTIL , VENTOLIN , PROAIR , HFA 90 mcg/actuation inhaler Inhale 2 inhalations into the lungs every 4 (four) hours as needed for Wheezing or Shortness of Breath       celecoxib  (CELEBREX ) 100 MG capsule Take 100 mg by mouth 2 (two) times daily       cetirizine (ZYRTEC) 10 MG tablet Take 10 mg by mouth       cyanocobalamin (VITAMIN B12) 500 MCG tablet Take by mouth once daily       folic acid (FOLVITE) 400 MCG tablet Take 400 mcg by mouth once daily       gabapentin  (NEURONTIN ) 100 MG capsule TAKE 2 CAPSULES(200 MG) BY MOUTH AT BEDTIME       losartan  (COZAAR ) 25 MG tablet Take 25 mg by mouth once daily       multivitamin tablet Take 1 tablet by mouth once daily       spironolactone  (ALDACTONE ) 25 MG tablet Take 25 mg by mouth once daily        No facility-administered medications prior to visit.          Objective     Vitals:    07/28/24 0853 07/28/24 0854  BP: 138/80    Pulse: 103    Temp: 36.4 C (97.5 F)    SpO2: 97%    Weight: (!) 107.1 kg (236 lb  3.2 oz)    Height: 157.5 cm (5' 2)    PainSc:   0-No pain    Body mass index is 43.2 kg/m.   Home Vitals:      Physical Exam   Head:   Normocephalic and atraumatic.  Eyes:    Conjunctivae are normal. Pupils are equal, round, and reactive to light. No scleral icterus.  Resp:No respiratory distress, normal effort. Skin: some mild radiation changes on right.  Neurological: Alert and oriented to person, place, and time. Coordination normal.  Skin:    Skin is warm and dry. No rash noted. No diaphoretic. No erythema. No pallor.  Psychiatric: Normal mood and affect. Normal behavior. Judgment and thought content normal.    Results CBC, CMET normal 06/09/24         Assessment/Plan:    Assessment &  Plan Implanted port removal Indwelling port no longer required, increased risk of adherence due to prolonged dwell time. Removal in operating room to minimize complications. Discussed risks: infection, bleeding, adherence, potential catheter fracture. - Scheduled port removal in operating room next Monday. - Planned procedural sedation with monitored anesthesia care, no general anesthesia. - Administer local anesthetic; perform incision and careful dissection around port. - Trace catheter to ensure no adherence before removal. - Close incision with absorbable sutures. - Contingency: Consult interventional radiology for retrieval if port or catheter is too adherent or fractures. - Prescribed limited opioid analgesics for post-procedural pain. - Recommended acetaminophen  or ibuprofen  for pain control; advised use of ice and optional topical anesthetic (avoiding incision site). - Advised no heavy lifting for one week post-procedure. - Cleared to work from home the day after surgery if she feels well, with guidance to rest as needed.   Diagnoses and all orders for this visit:   Port-A-Cath in place   Metastasis from breast cancer (CMS/HHS-HCC)   Postoperative breast asymmetry   - I offered a referral to plastic surgery for symmetry operation with left breast reduction.  Patient wants to think about that and deal with that after the holidays.     "

## 2024-08-02 NOTE — Anesthesia Preprocedure Evaluation (Signed)
 "                                  Anesthesia Evaluation  Patient identified by MRN, date of birth, ID band Patient awake    Reviewed: Allergy & Precautions, NPO status , Patient's Chart, lab work & pertinent test results  History of Anesthesia Complications Negative for: history of anesthetic complications  Airway Mallampati: II  TM Distance: >3 FB Neck ROM: Full    Dental no notable dental hx. (+) Teeth Intact   Pulmonary    Pulmonary exam normal breath sounds clear to auscultation       Cardiovascular hypertension, (-) angina (-) Past MI Normal cardiovascular exam Rhythm:Regular Rate:Normal     Neuro/Psych  Neuromuscular disease    GI/Hepatic Neg liver ROS,,,  Endo/Other    Renal/GU negative Renal ROSLab Results      Component                Value               Date                      NA                       138                 07/28/2024                CL                       102                 07/28/2024                K                        4.7                 07/28/2024                CO2                      29                  07/28/2024                BUN                      10                  07/28/2024                CREATININE               0.92                07/28/2024                GFRNONAA                 >60                 07/28/2024                CALCIUM  9.6                 07/28/2024                ALBUMIN                  4.1                 06/09/2024                GLUCOSE                  108 (H)             07/28/2024                Musculoskeletal   Abdominal   Peds  Hematology   Anesthesia Other Findings All: ASA Codeine  S/P R lumpectomy w r lymph node diection  Reproductive/Obstetrics                              Anesthesia Physical Anesthesia Plan  ASA: 3  Anesthesia Plan: MAC   Post-op Pain Management: Tylenol  PO (pre-op)*   Induction:  Intravenous  PONV Risk Score and Plan: 4 or greater and TIVA, Ondansetron , Treatment may vary due to age or medical condition, Midazolam  and Propofol  infusion  Airway Management Planned: Natural Airway and Nasal Cannula  Additional Equipment: None  Intra-op Plan:   Post-operative Plan:   Informed Consent: I have reviewed the patients History and Physical, chart, labs and discussed the procedure including the risks, benefits and alternatives for the proposed anesthesia with the patient or authorized representative who has indicated his/her understanding and acceptance.     Dental advisory given  Plan Discussed with: CRNA and Surgeon  Anesthesia Plan Comments:          Anesthesia Quick Evaluation  "

## 2024-08-03 ENCOUNTER — Encounter (HOSPITAL_BASED_OUTPATIENT_CLINIC_OR_DEPARTMENT_OTHER): Admission: RE | Disposition: A | Payer: Self-pay | Source: Home / Self Care | Attending: General Surgery

## 2024-08-03 ENCOUNTER — Ambulatory Visit (HOSPITAL_BASED_OUTPATIENT_CLINIC_OR_DEPARTMENT_OTHER): Payer: Self-pay | Admitting: Anesthesiology

## 2024-08-03 ENCOUNTER — Encounter (HOSPITAL_BASED_OUTPATIENT_CLINIC_OR_DEPARTMENT_OTHER): Payer: Self-pay | Admitting: Anesthesiology

## 2024-08-03 ENCOUNTER — Encounter (HOSPITAL_BASED_OUTPATIENT_CLINIC_OR_DEPARTMENT_OTHER): Payer: Self-pay | Admitting: General Surgery

## 2024-08-03 ENCOUNTER — Ambulatory Visit (HOSPITAL_BASED_OUTPATIENT_CLINIC_OR_DEPARTMENT_OTHER)
Admission: RE | Admit: 2024-08-03 | Discharge: 2024-08-03 | Disposition: A | Discharge status: Home / Self Care | Attending: General Surgery | Admitting: General Surgery

## 2024-08-03 ENCOUNTER — Other Ambulatory Visit

## 2024-08-03 DIAGNOSIS — Z452 Encounter for adjustment and management of vascular access device: Secondary | ICD-10-CM | POA: Insufficient documentation

## 2024-08-03 DIAGNOSIS — Z853 Personal history of malignant neoplasm of breast: Secondary | ICD-10-CM | POA: Insufficient documentation

## 2024-08-03 DIAGNOSIS — Z791 Long term (current) use of non-steroidal anti-inflammatories (NSAID): Secondary | ICD-10-CM | POA: Diagnosis not present

## 2024-08-03 DIAGNOSIS — Z923 Personal history of irradiation: Secondary | ICD-10-CM | POA: Insufficient documentation

## 2024-08-03 DIAGNOSIS — Z79899 Other long term (current) drug therapy: Secondary | ICD-10-CM | POA: Diagnosis not present

## 2024-08-03 DIAGNOSIS — Z7951 Long term (current) use of inhaled steroids: Secondary | ICD-10-CM | POA: Diagnosis not present

## 2024-08-03 DIAGNOSIS — Z85118 Personal history of other malignant neoplasm of bronchus and lung: Secondary | ICD-10-CM | POA: Diagnosis not present

## 2024-08-03 DIAGNOSIS — Z9221 Personal history of antineoplastic chemotherapy: Secondary | ICD-10-CM | POA: Diagnosis not present

## 2024-08-03 DIAGNOSIS — I10 Essential (primary) hypertension: Secondary | ICD-10-CM | POA: Insufficient documentation

## 2024-08-03 DIAGNOSIS — Z1231 Encounter for screening mammogram for malignant neoplasm of breast: Secondary | ICD-10-CM | POA: Diagnosis not present

## 2024-08-03 HISTORY — PX: PORT-A-CATH REMOVAL: SHX5289

## 2024-08-03 SURGERY — REMOVAL PORT-A-CATH
Anesthesia: Monitor Anesthesia Care | Site: Chest | Laterality: Left

## 2024-08-03 MED ORDER — CEFAZOLIN SODIUM-DEXTROSE 2-4 GM/100ML-% IV SOLN
2.0000 g | INTRAVENOUS | Status: AC
  Administered 2024-08-03: 2 g via INTRAVENOUS

## 2024-08-03 MED ORDER — PROPOFOL 500 MG/50ML IV EMUL
INTRAVENOUS | Status: DC | PRN
  Administered 2024-08-03: 120 ug/kg/min via INTRAVENOUS

## 2024-08-03 MED ORDER — FENTANYL CITRATE (PF) 100 MCG/2ML IJ SOLN: 25.0000 ug | INTRAMUSCULAR | Status: DC | PRN

## 2024-08-03 MED ORDER — FENTANYL CITRATE (PF) 100 MCG/2ML IJ SOLN
INTRAMUSCULAR | Status: DC | PRN
  Administered 2024-08-03: 25 ug via INTRAVENOUS
  Administered 2024-08-03 (×2): 12.5 ug via INTRAVENOUS
  Administered 2024-08-03: 50 ug via INTRAVENOUS

## 2024-08-03 MED ORDER — CEFAZOLIN SODIUM-DEXTROSE 2-4 GM/100ML-% IV SOLN
INTRAVENOUS | Status: AC
  Filled 2024-08-03: qty 100

## 2024-08-03 MED ORDER — LIDOCAINE 2% (20 MG/ML) 5 ML SYRINGE
INTRAMUSCULAR | Status: DC | PRN
  Administered 2024-08-03: 60 mg via INTRAVENOUS

## 2024-08-03 MED ORDER — FENTANYL CITRATE (PF) 100 MCG/2ML IJ SOLN
INTRAMUSCULAR | Status: AC
  Filled 2024-08-03: qty 2

## 2024-08-03 MED ORDER — PROPOFOL 10 MG/ML IV BOLUS
INTRAVENOUS | Status: AC
  Filled 2024-08-03: qty 20

## 2024-08-03 MED ORDER — ONDANSETRON HCL 4 MG/2ML IJ SOLN
INTRAMUSCULAR | Status: AC
  Filled 2024-08-03: qty 2

## 2024-08-03 MED ORDER — LACTATED RINGERS IV SOLN: INTRAVENOUS | Status: DC

## 2024-08-03 MED ORDER — ACETAMINOPHEN 500 MG PO TABS: 1000.0000 mg | ORAL_TABLET | Freq: Once | ORAL | Status: AC

## 2024-08-03 MED ORDER — DEXAMETHASONE SOD PHOSPHATE PF 10 MG/ML IJ SOLN
INTRAMUSCULAR | Status: DC | PRN
  Administered 2024-08-03: 4 mg via INTRAVENOUS

## 2024-08-03 MED ORDER — ACETAMINOPHEN 500 MG PO TABS
ORAL_TABLET | ORAL | Status: AC
  Filled 2024-08-03: qty 2

## 2024-08-03 MED ORDER — PROPOFOL 500 MG/50ML IV EMUL
INTRAVENOUS | Status: AC
  Filled 2024-08-03: qty 50

## 2024-08-03 MED ORDER — ONDANSETRON HCL 4 MG/2ML IJ SOLN
INTRAMUSCULAR | Status: DC | PRN
  Administered 2024-08-03: 4 mg via INTRAVENOUS

## 2024-08-03 MED ORDER — ACETAMINOPHEN 500 MG PO TABS
1000.0000 mg | ORAL_TABLET | ORAL | Status: AC
  Administered 2024-08-03: 1000 mg via ORAL

## 2024-08-03 MED ORDER — PROPOFOL 10 MG/ML IV BOLUS
INTRAVENOUS | Status: DC | PRN
  Administered 2024-08-03: 20 mg via INTRAVENOUS

## 2024-08-03 MED ORDER — OXYCODONE HCL 5 MG PO TABS: 5.0000 mg | ORAL_TABLET | Freq: Four times a day (QID) | ORAL | 0 refills | Status: AC | PRN

## 2024-08-03 MED ORDER — DEXMEDETOMIDINE HCL IN NACL 80 MCG/20ML IV SOLN
INTRAVENOUS | Status: DC | PRN
  Administered 2024-08-03: 4 ug via INTRAVENOUS

## 2024-08-03 MED ORDER — MIDAZOLAM HCL 5 MG/5ML IJ SOLN
INTRAMUSCULAR | Status: DC | PRN
  Administered 2024-08-03 (×2): 1 mg via INTRAVENOUS

## 2024-08-03 MED ORDER — LIDOCAINE 2% (20 MG/ML) 5 ML SYRINGE
INTRAMUSCULAR | Status: AC
  Filled 2024-08-03: qty 5

## 2024-08-03 MED ORDER — ONDANSETRON HCL 4 MG/2ML IJ SOLN: 4.0000 mg | Freq: Once | INTRAMUSCULAR | Status: DC | PRN

## 2024-08-03 MED ORDER — MIDAZOLAM HCL 2 MG/2ML IJ SOLN
INTRAMUSCULAR | Status: AC
  Filled 2024-08-03: qty 2

## 2024-08-03 MED ORDER — LIDOCAINE-EPINEPHRINE (PF) 1 %-1:200000 IJ SOLN
INTRAMUSCULAR | Status: DC | PRN
  Administered 2024-08-03: 10 mL

## 2024-08-03 SURGICAL SUPPLY — 26 items
BLADE HEX COATED 2.75 (ELECTRODE) ×1 IMPLANT
BLADE SURG 10 STRL SS (BLADE) ×1 IMPLANT
CANISTER SUCT 1200ML W/VALVE (MISCELLANEOUS) IMPLANT
CHLORAPREP W/TINT 26 (MISCELLANEOUS) ×1 IMPLANT
COVER BACK TABLE 60X90IN (DRAPES) ×1 IMPLANT
COVER MAYO STAND STRL (DRAPES) ×1 IMPLANT
DERMABOND ADVANCED .7 DNX12 (GAUZE/BANDAGES/DRESSINGS) ×1 IMPLANT
DRAPE LAPAROSCOPIC ABDOMINAL (DRAPES) ×1 IMPLANT
DRAPE UTILITY XL STRL (DRAPES) ×1 IMPLANT
ELECTRODE REM PT RTRN 9FT ADLT (ELECTROSURGICAL) ×1 IMPLANT
GLOVE BIO SURGEON STRL SZ 6 (GLOVE) ×1 IMPLANT
GLOVE BIOGEL PI IND STRL 6.5 (GLOVE) ×1 IMPLANT
GOWN STRL REUS W/ TWL LRG LVL3 (GOWN DISPOSABLE) ×1 IMPLANT
GOWN STRL REUS W/ TWL XL LVL3 (GOWN DISPOSABLE) ×1 IMPLANT
NDL HYPO 25X1 1.5 SAFETY (NEEDLE) ×1 IMPLANT
NEEDLE HYPO 25X1 1.5 SAFETY (NEEDLE) ×1 IMPLANT
PACK BASIN DAY SURGERY FS (CUSTOM PROCEDURE TRAY) ×1 IMPLANT
PENCIL SMOKE EVACUATOR (MISCELLANEOUS) ×1 IMPLANT
SOLN 0.9% NACL POUR BTL 1000ML (IV SOLUTION) IMPLANT
SPIKE FLUID TRANSFER (MISCELLANEOUS) IMPLANT
SUT MNCRL AB 4-0 PS2 18 (SUTURE) ×1 IMPLANT
SUT VICRYL 3-0 CR8 SH (SUTURE) ×1 IMPLANT
SYR CONTROL 10ML LL (SYRINGE) ×1 IMPLANT
TOWEL GREEN STERILE FF (TOWEL DISPOSABLE) ×1 IMPLANT
TUBE CONNECTING 20X1/4 (TUBING) IMPLANT
YANKAUER SUCT BULB TIP NO VENT (SUCTIONS) IMPLANT

## 2024-08-03 NOTE — Op Note (Signed)
" °  PRE-OPERATIVE DIAGNOSIS:  un-needed Port-A-Cath for metastatic breast cancer  POST-OPERATIVE DIAGNOSIS:  Same   PROCEDURE:  Procedure(s):  REMOVAL PORT-A-CATH  SURGEON:  Surgeon(s):  Jina Nephew, MD  ANESTHESIA:   MAC + local  EBL:   Minimal  SPECIMEN:  None  Complications : none known  Procedure:   Pt was  identified in the holding area and taken to the operating room where she was placed supine on the operating room table.  MAC anesthesia was induced.  The left upper chest was prepped and draped.  The prior incision was anesthetized with local anesthetic.  The incision was opened with a #15 blade.  The subcutaneous tissue was divided with the cautery.  The port was identified and the capsule opened.  The four 2-0 prolene sutures were removed.  The port was then removed and pressure held on the tract.  The catheter appeared intact without evidence of breakage, length was 25 cm (same as upon insertion).  The wound was inspected for hemostasis, which was achieved with cautery.  The wound was closed with 3-0 vicryl deep dermal interrupted sutures and 4-0 Monocryl running subcuticular suture.  The wound was cleaned, dried, and dressed with dermabond.  The patient was awakened from anesthesia and taken to the PACU in stable condition.  Needle, sponge, and instrument counts are correct.    "

## 2024-08-03 NOTE — Interval H&P Note (Signed)
 History and Physical Interval Note:  08/03/2024 1:17 PM  [name] [name]  has presented today for surgery, with the diagnosis of PORT IN PLACE.  The various methods of treatment have been discussed with the patient and family. After consideration of risks, benefits and other options for treatment, the patient has consented to  Procedures: REMOVAL PORT-A-CATH (N/A) as a surgical intervention.  The patient's history has been reviewed, patient examined, no change in status, stable for surgery.  I have reviewed the patient's chart and labs.  Questions were answered to the patient's satisfaction.     Jina Nephew

## 2024-08-03 NOTE — Discharge Instructions (Addendum)
 [facility] Office Phone Number [phone]   POST OP INSTRUCTIONS  Always review your discharge instruction sheet given to you by the facility where your surgery was performed.  IF YOU HAVE DISABILITY OR FAMILY LEAVE FORMS, YOU MUST BRING THEM TO THE OFFICE FOR PROCESSING.  DO NOT GIVE THEM TO YOUR DOCTOR.  Take 2 tylenol  (acetominophen) three times a day for 3 days.  If you still have pain, add ibuprofen  with food in between if able to take this (if you have kidney issues or stomach issues, do not take ibuprofen ).  If both of those are not enough, add the narcotic pain pill.  If you find you are needing a lot of this overnight after surgery, call the next morning for a refill.   Take your usually prescribed medications unless otherwise directed If you need a refill on your pain medication, please contact your pharmacy.  They will contact our office to request authorization.  Prescriptions will not be filled after 5pm or on week-ends. You should eat very light the first 24 hours after surgery, such as soup, crackers, pudding, etc.  Resume your normal diet the day after surgery It is common to experience some constipation if taking pain medication after surgery.  Increasing fluid intake and taking a stool softener will usually help or prevent this problem from occurring.  A mild laxative (Milk of Magnesia or Miralax) should be taken according to package directions if there are no bowel movements after 48 hours. You may shower in 48 hours.  The surgical glue will flake off in 2-3 weeks.   ACTIVITIES:  No strenuous activity or heavy lifting for 1 week.   You may drive when you no longer are taking prescription pain medication, you can comfortably wear a seatbelt, and you can safely maneuver your car and apply brakes. RETURN TO WORK:  __________1 week_______________ [name] [name] should see your doctor in the office for a follow-up appointment approximately three-four weeks after your surgery.     WHEN TO CALL YOUR DOCTOR: Fever over 101.0 Nausea and/or vomiting. Extreme swelling or bruising. Continued bleeding from incision. Increased pain, redness, or drainage from the incision.  The clinic staff is available to answer your questions during regular business hours.  Please dont hesitate to call and ask to speak to one of the nurses for clinical concerns.  If you have a medical emergency, go to the nearest emergency room or call 911.  A surgeon from [facility] is always on call at the hospital.  For further questions, please visit [email or web address]     Post Anesthesia Home Care Instructions  Activity: Get plenty of rest for the remainder of the day. A responsible individual must stay with you for 24 hours following the procedure.  For the next 24 hours, DO NOT: -Drive a car -Advertising copywriter -Drink alcoholic beverages -Take any medication unless instructed by your physician -Make any legal decisions or sign important papers.  Meals: Start with liquid foods such as gelatin or soup. Progress to regular foods as tolerated. Avoid greasy, spicy, heavy foods. If nausea and/or vomiting occur, drink only clear liquids until the nausea and/or vomiting subsides. Call your physician if vomiting continues.  Special Instructions/Symptoms: Your throat may feel dry or sore from the anesthesia or the breathing tube placed in your throat during surgery. If this causes discomfort, gargle with warm salt water. The discomfort should disappear within 24 hours.  If you had a scopolamine  patch placed behind your ear for the  management of post- operative nausea and/or vomiting:  1. The medication in the patch is effective for 72 hours, after which it should be removed.  Wrap patch in a tissue and discard in the trash. Wash hands thoroughly with soap and water. 2. You may remove the patch earlier than 72 hours if you experience unpleasant side effects which may include dry  mouth, dizziness or visual disturbances. 3. Avoid touching the patch. Wash your hands with soap and water after contact with the patch.     No tylenol  until after 630pm

## 2024-08-03 NOTE — Transfer of Care (Signed)
 Immediate Anesthesia Transfer of Care Note  Patient: [name] [name]  Procedure(s) Performed: REMOVAL PORT-A-CATH (Left: Chest)  Patient Location: PACU  Anesthesia Type:MAC  Level of Consciousness: awake, alert , oriented, and patient cooperative  Airway & Oxygen Therapy: Patient Spontanous Breathing and Patient connected to face mask oxygen  Post-op Assessment: Report given to RN and Post -op Vital signs reviewed and stable  Post vital signs: Reviewed and stable  Last Vitals:  Vitals Value Taken Time  BP 124/74 08/03/24 14:20  Temp 36.2 C 08/03/24 14:20  Pulse 65 08/03/24 14:20  Resp 19 08/03/24 14:20  SpO2 100 % 08/03/24 14:20    Last Pain:  Vitals:   08/03/24 1221  TempSrc: Temporal  PainSc: 0-No pain      Patients Stated Pain Goal: 1 (08/03/24 1221)  Complications: No notable events documented.

## 2024-08-03 NOTE — Anesthesia Postprocedure Evaluation (Signed)
"   Anesthesia Post Note  Patient: [name] [name]  Procedure(s) Performed: REMOVAL PORT-A-CATH (Left: Chest)     Patient location during evaluation: PACU Anesthesia Type: MAC Level of consciousness: awake and alert Pain management: pain level controlled Vital Signs Assessment: post-procedure vital signs reviewed and stable Respiratory status: spontaneous breathing, nonlabored ventilation, respiratory function stable and patient connected to nasal cannula oxygen Cardiovascular status: stable and blood pressure returned to baseline Postop Assessment: no apparent nausea or vomiting Anesthetic complications: no   No notable events documented.  Last Vitals:  Vitals:   08/03/24 1445 08/03/24 1505  BP: 125/73 112/84  Pulse: (!) 58 61  Resp: 18 18  Temp:  (!) 36.2 C  SpO2: 97% 93%    Last Pain:  Vitals:   08/03/24 1505  TempSrc:   PainSc: 0-No pain                 Garnette LABOR [name] [name]      "

## 2024-08-04 ENCOUNTER — Encounter (HOSPITAL_BASED_OUTPATIENT_CLINIC_OR_DEPARTMENT_OTHER): Payer: Self-pay | Admitting: General Surgery

## 2024-08-11 ENCOUNTER — Encounter: Payer: Self-pay | Admitting: Hematology

## 2024-08-12 ENCOUNTER — Ambulatory Visit

## 2024-08-14 NOTE — Progress Notes (Signed)
 Seen by patient [name] [name] on 07/15/2024  7:51 AM

## 2024-08-28 ENCOUNTER — Other Ambulatory Visit: Payer: Self-pay | Admitting: Family Medicine

## 2024-08-28 DIAGNOSIS — I10 Essential (primary) hypertension: Secondary | ICD-10-CM

## 2024-09-01 ENCOUNTER — Encounter: Payer: Self-pay | Admitting: Hematology

## 2024-09-02 ENCOUNTER — Encounter: Payer: Self-pay | Admitting: Hematology

## 2024-09-02 ENCOUNTER — Other Ambulatory Visit: Payer: Self-pay | Admitting: Radiology

## 2024-09-03 LAB — SURGICAL PATHOLOGY

## 2024-09-08 DIAGNOSIS — D0512 Intraductal carcinoma in situ of left breast: Secondary | ICD-10-CM | POA: Insufficient documentation

## 2024-09-08 NOTE — Assessment & Plan Note (Signed)
 T3N1M1, stage IV, ER-/PR-/HER2+, ypT29miN0, NED -diagnosed 06/2014 with metastatic disease, s/p neoadjuvant chemo, right lumpectomy, adjuvant radiation and currently on Maintenance Herceptin  since 04/12/15. Her restaging scan has showed NED -10/17/2022 - CTA chest - negative for PE or thoracic aortic dissection. No acute findings. There is subpleural scarring of anterior right upper lobe. No new nodules or new disease noted.  -10/17/2022 - CT abdomen and pelvis - colonic diverticulosis. Right renal stone without hydronephrosis.  -new restaging CT CAP due 10/2023.  -She has been on maintenance Herceptin  for about 7.5 years.  She is probably cured, although I am not able to approve it. Will continue Herceptin  for now, I would consider stopping therapy after 10 years -she continues to tolerate herceptin  infusions well.  -03/20/2023 - patient complains of moderate to severe fatigue. She is unsure if this fatigue is similar to fatigue she has previously experienced related to chemotherapy treatments, or if this fatigue is worse. It has not been intrusive yet, but she feels like it will become intrusive soon. Increased concern because her aunt, also a breast cancer survivor, was diagnosed with liver cancer at the same time the patient was diagnosed with breast cancer, in 2015. The patient states that her aunt's only complaint was fatigue.  -Labs reviewed, overall stable. CBC normal. Glucose 162 with remainder of CMP normal.  -continue labs/flush and herceptin  infusions every 28 days.  -F/u in 4 months  -reviewed CTA chest and CT abdomen and pelvis with the patient, done 10/2022. There was no evidence of metastatic or new disease noted.  -repeated CT CAP in 03/2023 was negative for recurrence, echo in 04/2023 showed normal EF   -repeated CT in 01/2024 showed NED. ctDNA [place] in 01/2024 was also negative, she is likely cured.  -I stopped her treatment in 01/2024 and will monitor closely

## 2024-09-09 ENCOUNTER — Encounter: Payer: Self-pay | Admitting: Hematology

## 2024-09-09 ENCOUNTER — Inpatient Hospital Stay: Payer: PRIVATE HEALTH INSURANCE | Admitting: Hematology

## 2024-09-09 ENCOUNTER — Inpatient Hospital Stay: Payer: PRIVATE HEALTH INSURANCE | Attending: Hematology

## 2024-09-09 VITALS — BP 135/80 | HR 81 | Temp 97.7°F | Resp 17 | Wt 237.2 lb

## 2024-09-09 DIAGNOSIS — C50911 Malignant neoplasm of unspecified site of right female breast: Secondary | ICD-10-CM

## 2024-09-09 DIAGNOSIS — D0512 Intraductal carcinoma in situ of left breast: Secondary | ICD-10-CM

## 2024-09-09 LAB — CBC WITH DIFFERENTIAL (CANCER CENTER ONLY)
Abs Immature Granulocytes: 0.01 10*3/uL (ref 0.00–0.07)
Basophils Absolute: 0 10*3/uL (ref 0.0–0.1)
Basophils Relative: 1 %
Eosinophils Absolute: 0.1 10*3/uL (ref 0.0–0.5)
Eosinophils Relative: 2 %
HCT: 40.1 % (ref 36.0–46.0)
Hemoglobin: 13.5 g/dL (ref 12.0–15.0)
Immature Granulocytes: 0 %
Lymphocytes Relative: 38 %
Lymphs Abs: 2.4 10*3/uL (ref 0.7–4.0)
MCH: 29.3 pg (ref 26.0–34.0)
MCHC: 33.7 g/dL (ref 30.0–36.0)
MCV: 87 fL (ref 80.0–100.0)
Monocytes Absolute: 0.5 10*3/uL (ref 0.1–1.0)
Monocytes Relative: 8 %
Neutro Abs: 3.2 10*3/uL (ref 1.7–7.7)
Neutrophils Relative %: 51 %
Platelet Count: 247 10*3/uL (ref 150–400)
RBC: 4.61 MIL/uL (ref 3.87–5.11)
RDW: 12.8 % (ref 11.5–15.5)
WBC Count: 6.3 10*3/uL (ref 4.0–10.5)
nRBC: 0 % (ref 0.0–0.2)

## 2024-09-09 LAB — CMP (CANCER CENTER ONLY)
ALT: 18 U/L (ref 0–44)
AST: 19 U/L (ref 15–41)
Albumin: 4.4 g/dL (ref 3.5–5.0)
Alkaline Phosphatase: 81 U/L (ref 38–126)
Anion gap: 11 (ref 5–15)
BUN: 13 mg/dL (ref 6–20)
CO2: 26 mmol/L (ref 22–32)
Calcium: 9.8 mg/dL (ref 8.9–10.3)
Chloride: 101 mmol/L (ref 98–111)
Creatinine: 0.9 mg/dL (ref 0.44–1.00)
GFR, Estimated: >60 mL/min
Glucose, Bld: 96 mg/dL (ref 70–99)
Potassium: 4.1 mmol/L (ref 3.5–5.1)
Sodium: 138 mmol/L (ref 135–145)
Total Bilirubin: 0.5 mg/dL (ref 0.0–1.2)
Total Protein: 8.2 g/dL — ABNORMAL HIGH (ref 6.5–8.1)

## 2024-09-09 NOTE — Progress Notes (Signed)
 " [facility]   Telephone:([phone] Fax:([phone]) [phone]   Clinic Follow up Note   Patient Care Team: [name], MD as PCP - General (Family Medicine) Aron Shoulders, MD as Consulting Physician (General Surgery) Lanny Callander, MD as Consulting Physician (Hematology) Bensimhon, Toribio SAUNDERS, MD as Consulting Physician (Cardiology) Mammography, [facility] as Radiologist (Diagnostic Radiology)  Date of Service:  09/09/2024  CHIEF COMPLAINT: Newly diagnosed left breast DCIS  CURRENT THERAPY:  Pending surgery  Oncology History   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 Her restaging scan has showed NED -10/17/2022 - CTA chest - negative for PE or thoracic aortic dissection. No acute findings. There is subpleural scarring of anterior right upper lobe. No new nodules or new disease noted.  -10/17/2022 - CT abdomen and pelvis - colonic diverticulosis. Right renal stone without hydronephrosis.  -new restaging CT CAP due 10/2023.  -She has been on maintenance Herceptin  for about 7.5 years.  She is probably cured, although I am not able to approve it. Will continue Herceptin  for now, I would consider stopping therapy after 10 years -she continues to tolerate herceptin  infusions well.  -03/20/2023 - patient complains of moderate to severe fatigue. She is unsure if this fatigue is similar to fatigue she has previously experienced related to chemotherapy treatments, or if this fatigue is worse. It has not been intrusive yet, but she feels like it will become intrusive soon. Increased concern because her aunt, also a breast cancer survivor, was diagnosed with liver cancer at the same time the patient was diagnosed with breast cancer, in 2015. The patient states that her aunt's only complaint was  fatigue.  -Labs reviewed, overall stable. CBC normal. Glucose 162 with remainder of CMP normal.  -continue labs/flush and herceptin  infusions every 28 days.  -F/u in 4 months  -reviewed CTA chest and CT abdomen and pelvis with the patient, done 10/2022. There was no evidence of metastatic or new disease noted.  -repeated CT CAP in 03/2023 was negative for recurrence, echo in 04/2023 showed normal EF   -repeated CT in 01/2024 showed NED. ctDNA Signatera in 01/2024 was also negative, she is likely cured.  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 This represents a new primary malignancy, unrelated to her prior right breast cancer. Prognosis is excellent with high cure rate; no expected lymph node involvement or metastasis. Elevated risk for future breast cancer due to age and history, but no genetic predisposition identified. Surgical excision is indicated, with adjuvant therapy planned. Occult invasive disease remains a possibility, which would alter management. Double mastectomy was discussed but not recommended in the absence of genetic predisposition due to increased morbidity and only marginal additional risk reduction. - Referred to breast surgeon for left breast lumpectomy. - Planned adjuvant radiation therapy to the left breast following surgical recovery (approximately 3-4 weeks, Monday through Friday). - Planned initiation of tamoxifen 5 mg daily for 3 years after completion of radiation; discussed lower dose and generally favorable tolerability, with hot flashes as a possible but less frequent side effect. - Provided anticipatory guidance regarding double mastectomy, but not recommended without genetic predisposition. - No port placement needed as chemotherapy is not  planned. - Monitor final surgical  pathology for evidence of invasive carcinoma; if present, adjust therapy (increase tamoxifen dose or consider aromatase inhibitor, and send tissue for further testing to assess chemotherapy need). - Schedule follow-up at end of radiation therapy to initiate tamoxifen.  History of metastatic breast cancer of right breast Metastatic triple negative breast cancer of the right breast, previously treated, currently with no evidence of disease. Ongoing surveillance is appropriate. The new left breast DCIS is unrelated. No new symptoms or evidence of recurrence. Prior genetic testing negative and recently completed. - Continue surveillance for recurrence of right breast cancer with Signatera ctDNA testing every four months. - Ordered lab appointment for Signatera ctDNA test in one month. - If new invasive cancer is detected in the left breast, consider switching to a broader ctDNA test to monitor for recurrence of both breast cancers. - No need to repeat genetic testing at this time.   Plan - I reviewed her recent images and biopsy results, which showed left breast DCIS. - Scheduled with her breast surgeon Dr. Aron next Monday to review surgery. - I will see her at the end of adjuvant radiation, to start her on tamoxifen, or sooner if needed. - Schedule her next lab for Signatera in a month.  SUMMARY OF ONCOLOGIC HISTORY: Oncology History Overview Note  Breast cancer metastasized to lung   Staging form: Breast, AJCC 7th Edition     Clinical stage from 07/22/2014: Stage IV (T3, N1, M1) - Unsigned     Breast cancer metastasized to lung (HCC)  07/02/2014 Mammogram   Mammogram showed a 2cm right beast mass and a 1.8cm right axillary node. MRI breast on 07/16/2014 showed 7cm R breast lesion and 4.4cm r axillary node    07/02/2014 Imaging   CT CAP: a 4.7cm mass in LUL lung and a 2.1cm mas in RML, and a small nodule in RUL, suspecious for metastasis     07/09/2014 Initial Diagnosis   right IDA  with b/l lung lesions, ER-/PR-/HER2+   07/09/2014 Initial Biopsy   US  guided right breast mass and axillary node biopsy showed IDA, and DCIS, ER-/PR-/HER2+   07/26/2014 Pathologic Stage   Left lung mass by IR, path revealed high grade carcinoma, morphology similar to breast tumor biopsy, TTF(-), NapsinA(-), ER(-)   08/04/2014 - 03/22/2015 Chemotherapy   weekly Paclitaxel  80mg /m2, trastuzumab  and pertuzumab  every 3 weeks   08/30/2014 Genetic Testing   BreastNext panel was negative. 17 genes including BRCA1, BRCA2, were negative for mutations.    10/04/2014 Imaging   Interval decrease in the right axillary lymphadenopathy. Bilateral pulmonary lesions with left hilar lymphadenopathy also markedly decreased in the interval. The left hilar lymphadenopathy has resolved.   12/20/2014 Imaging   restaging CT showed stable disease, no new lesions    03/29/2015 Pathology Results    right breast lumpectomy showed  chemotherapy treatment effect,  a 1 mm residual tumor,   margins were widely negative, 5 sentinel lymph nodes and 2 axillary lymph nodes were negative.    03/29/2015 Surgery    right breast lumpectomy and sentinel lymph node biopsy  by Dr. Aron   04/12/2015 -  Chemotherapy   Herceptin  maintenance therapy , 6 mg/kg, every 3 weeks since 04/12/15. Switched to Herceptin  Hylecta injection q3weeks after 06/10/19.   04/12/2015 - 10/04/2022 Chemotherapy   Patient is on Treatment Plan : BREAST Trastuzumab  q21d     05/03/2015 - 06/14/2015 Radiation Therapy   right breast adjuvant irradiation by Dr. Jason  08/28/2016 Imaging   CT CAP w Contrast  IMPRESSION: 1. No acute process or evidence of metastatic disease within the chest, abdomen, or pelvis. 2. Similar to less well-defined left upper lobe density, likely scarring. No evidence of new or progressive pulmonary metastasis. 3. Right nephrolithiasis.   01/22/2017 Imaging   CT Chest W Contrast 01/22/17 IMPRESSION: 1. Stable exam. No new or  progressive findings. No evidence for metastatic disease. 2. Left upper lobe architectural distortion/scarring is stable. 3. Nonobstructing right renal stone.   07/31/2017 Imaging   Bone Scan Whole Body 07/31/17  IMPRESSION: 1. No scintigraphic evidence of osseous metastatic disease. 2. Thoracolumbar scoliosis.    07/31/2017 Imaging   CT CAP W Contrast 07/31/17 IMPRESSION: 1. No findings of active or recurrent malignancy. 2. Other imaging findings of potential clinical significance: Aortic Atherosclerosis (ICD10-I70.0). Mild cardiomegaly. Postoperative and radiation therapy findings in the right chest. Nonobstructive right nephrolithiasis. Thoracolumbar scoliosis.     01/20/2018 Echocardiogram   ECHO 01/20/2018 Study Conclusions   - Left ventricle: The cavity size was normal. Wall thickness was   normal. Systolic function was normal. The estimated ejection   fraction was in the range of 55% to 60%. Wall motion was normal;   there were no regional wall motion abnormalities. Doppler   parameters are consistent with abnormal left ventricular   relaxation (grade 1 diastolic dysfunction). - Impressions: GLS -20.7% LS&' 10.4 cm.    02/17/2018 Imaging   02/17/2018 CT CAP IMPRESSION: 1. No evidence for residual or recurrent tumor or metastatic disease. 2.  Aortic Atherosclerosis (ICD10-I70.0). 3. Right renal calculi. 4. Scoliosis.   08/11/2018 Imaging   CT CAP W Contrast 08/11/18  IMPRESSION: No evidence for localized recurrence or metastatic disease in the chest, abdomen or pelvis.   02/23/2019 Imaging   CT CAP W Contrast  IMPRESSION: 1. No findings of active malignancy in the chest, abdomen, or pelvis. 2. 3 mm right kidney lower pole nonobstructive renal calculus. 3. Thoracolumbar scoliosis.   12/08/2019 Imaging   CT CAP W contrast  IMPRESSION: 1. Unchanged post treatment appearance of the lungs. No evidence of recurrent or new metastatic disease in the chest, abdomen,  or pelvis.   2. Postoperative findings of right lumpectomy and axillary lymph node dissection.   3.  Nonobstructive right nephrolithiasis.   11/17/2020 Imaging   CT CAP IMPRESSION: 1. No evidence of recurrence or new metastatic disease within the chest, abdomen, or pelvis. 2. Similar post treatment changes in the lungs and postoperative findings of right lumpectomy and axillary lymph node dissection. 3. Colonic diverticulosis without findings of acute diverticulitis.   08/10/2021 Genetic Testing   Negative genetic testing on the CancerNext-Expanded+RNAinsight panel.  The report date is August 10, 2021.  The CancerNext-Expanded gene panel offered by [facility] and includes sequencing and rearrangement analysis for the following 77 genes: AIP, ALK, APC*, ATM*, AXIN2, BAP1, BARD1, BLM, BMPR1A, BRCA1*, BRCA2*, BRIP1*, CDC73, CDH1*, CDK4, CDKN1B, CDKN2A, CHEK2*, CTNNA1, DICER1, FANCC, FH, FLCN, GALNT12, KIF1B, LZTR1, MAX, MEN1, MET, MLH1*, MSH2*, MSH3, MSH6*, MUTYH*, NBN, NF1*, NF2, NTHL1, PALB2*, PHOX2B, PMS2*, POT1, PRKAR1A, PTCH1, PTEN*, RAD51C*, RAD51D*, RB1, RECQL, RET, SDHA, SDHAF2, SDHB, SDHC, SDHD, SMAD4, SMARCA4, SMARCB1, SMARCE1, STK11, SUFU, TMEM127, TP53*, TSC1, TSC2, VHL and XRCC2 (sequencing and deletion/duplication); EGFR, EGLN1, HOXB13, KIT, MITF, PDGFRA, POLD1, and POLE (sequencing only); EPCAM and GREM1 (deletion/duplication only). DNA and RNA analyses performed for * genes.    10/17/2022 Imaging   CTA Chest and CT abdomen and pelvis with contrast  IMPRESSION: 1.  Negative for acute PE or thoracic aortic dissection. 2. No acute findings. 3. Colonic diverticulosis. 4. Right nephrolithiasis without hydronephrosis.   10/25/2022 - 10/25/2022 Chemotherapy   Patient is on Treatment Plan : BREAST MAINTENANCE Trastuzumab  IV (6) or SQ (600) D1 q21d X 11 Cycles     10/25/2022 - 01/20/2024 Chemotherapy   Patient is on Treatment Plan : BREAST MAINTENANCE Trastuzumab  IV (6) or SQ (600)  D1 q21d x 13 cycles     Ductal carcinoma in situ (DCIS) of left breast  09/02/2024 Cancer Staging   Staging form: Breast, AJCC 8th Edition - Clinical stage from 09/02/2024: Stage 0 (cTis (DCIS), cN0, cM0, ER+, PR+, HER2: Not Assessed) - Signed by Lanny Callander, MD on 09/08/2024 Stage prefix: Initial diagnosis   09/08/2024 Initial Diagnosis   Ductal carcinoma in situ (DCIS) of left breast      Discussed the use of AI scribe software for clinical note transcription with the patient, who gave verbal consent to proceed.  History of Present Illness [name] [name] is a 54 year old female with prior metastatic triple negative breast cancer, currently with no evidence of disease, who presents for evaluation of newly diagnosed ER/PR-positive ductal carcinoma in situ (DCIS) of the left breast.  She reports a screening mammogram last week showed a new abnormality in the left breast, now diagnosed as ER/PR-positive DCIS. She has no breast pain, nipple discharge, abnormal bleeding, or other new symptoms. She has no respiratory symptoms and notes only baseline chronic sinus issues without change.  Her prior metastatic triple negative breast cancer involved the contralateral breast. She completed chemotherapy and radiation to the right breast about 10 years ago and currently has no evidence of disease. Her chemotherapy port was removed on the day of the recent mammogram. Genetic testing was performed twice, most recently within the past 2 years, and no actionable mutations were found.  She continues surveillance for prior cancer with Signatera ctDNA testing every 4 months. The most recent test was negative.     All other systems were reviewed with the patient and are negative.  MEDICAL HISTORY:  Past Medical History:  Diagnosis Date   Allergy    Dust mites, ragweed, grass   Breast cancer (HCC)    Breast cancer (HCC)    CHF (congestive heart failure) (HCC)    Family history of breast cancer    Family  history of prostate cancer    GERD (gastroesophageal reflux disease)    during pregnancy    Hypertension    Pneumonia    hx of pneumonia 08/2013    S/P radiation therapy 05/03/2015 through 06/14/2015                                                      Right breast 4680 cGy in 26 sessions, right breast boost 1000 cGy in 5 sessions.  Right supraclavicular/axillary region 4680 cGy with a supplemental PA field to bring the axillary dose up to 4500 cGy in 26 sessions    SURGICAL HISTORY: Past Surgical History:  Procedure Laterality Date   BREAST LUMPECTOMY WITH RADIOACTIVE SEED AND SENTINEL LYMPH NODE BIOPSY Right 03/29/2015   Procedure: BREAST LUMPECTOMY WITH RADIOACTIVE SEED AND SENTINEL LYMPH NODE BIOPSY;  Surgeon: Jina Nephew, MD;  Location: [facility];  Service: General;  Laterality: Right;   BREAST SURGERY  CESAREAN SECTION     CHOLECYSTECTOMY     ESSURE TUBAL LIGATION     PORT-A-CATH REMOVAL Left 08/03/2024   Procedure: REMOVAL PORT-A-CATH;  Surgeon: Aron Shoulders, MD;  Location: [facility];  Service: General;  Laterality: Left;   PORTACATH PLACEMENT Left 07/21/2014   Procedure: INSERTION PORT-A-CATH;  Surgeon: Shoulders Aron, MD;  Location: [facility];  Service: General;  Laterality: Left;   WISDOM TOOTH EXTRACTION      I have reviewed the social history and family history with the patient and they are unchanged from previous note.  ALLERGIES:  is allergic to adhesive [tape], aspirin, and codeine.  MEDICATIONS:  Current Outpatient Medications  Medication Sig Dispense Refill   acetaminophen  (TYLENOL ) 500 MG tablet Take 500 mg by mouth every 6 (six) hours as needed.     Ascorbic Acid (VITAMIN C) 500 MG CHEW      Calcium-Phosphorus-Vitamin D (CALCIUM GUMMIES PO) Take by mouth daily.     Camphor-Eucalyptus-Menthol (VICKS VAPORUB EX) Apply 1 application. topically at bedtime. Applies under nose, throat and on chest as needed     celecoxib  (CELEBREX ) 100  MG capsule Take 1 capsule (100 mg total) by mouth 2 (two) times daily. 42 capsule 1   cetirizine (ZYRTEC) 10 MG tablet Take 10 mg by mouth daily as needed for allergies (allergies).      cyanocobalamin (VITAMIN B12) 500 MCG tablet Take by mouth daily.     fluticasone (FLONASE) 50 MCG/ACT nasal spray Place into both nostrils daily.     folic acid (FOLVITE) 400 MCG tablet Take 400 mcg by mouth daily.     gabapentin  (NEURONTIN ) 100 MG capsule TAKE 2 CAPSULES(200 MG) BY MOUTH AT BEDTIME 60 capsule 1   hydrocortisone  2.5 % cream Apply 1 application topically 2 (two) times daily as needed.     ibuprofen  (ADVIL ,MOTRIN ) 800 MG tablet Take 1 tablet (800 mg total) by mouth every 8 (eight) hours as needed. 60 tablet 1   losartan  (COZAAR ) 25 MG tablet Take 1 tablet (25 mg total) by mouth daily. 90 tablet 1   Multiple Vitamins-Minerals (MULTIVITAMIN GUMMIES ADULT PO) Take 1 each by mouth daily. Women's Vitafusion gummie     oxyCODONE  (OXY IR/ROXICODONE ) 5 MG immediate release tablet Take 1 tablet (5 mg total) by mouth every 6 (six) hours as needed for severe pain (pain score 7-10). 15 tablet 0   spironolactone  (ALDACTONE ) 25 MG tablet Take 1 tablet (25 mg total) by mouth daily. 90 tablet 1   vitamin E 45 MG (100 UNITS) capsule Take by mouth daily.     lidocaine -prilocaine  (EMLA ) cream APPLY TO THE AFFECTED AREA PRIOR TO CHEMOTHERAPY AS DIRECTED 30 g 2   No current facility-administered medications for this visit.   Facility-Administered Medications Ordered in Other Visits  Medication Dose Route Frequency Provider Last Rate Last Admin   sodium chloride  0.9 % injection 10 mL  10 mL Intravenous PRN Lanny Callander, MD       sodium chloride  flush (NS) 0.9 % injection 10 mL  10 mL Intravenous PRN Lanny Callander, MD        PHYSICAL EXAMINATION: ECOG PERFORMANCE STATUS: 0 - Asymptomatic  Vitals:   09/09/24 0858  BP: 135/80  Pulse: 81  Resp: 17  Temp: 97.7 F (36.5 C)  SpO2: 99%   Wt Readings from Last 3  Encounters:  09/09/24 237 lb 3.2 oz (107.6 kg)  08/03/24 237 lb 3.4 oz (107.6 kg)  07/08/24 240 lb (108.9 kg)  GENERAL:alert, no distress and comfortable SKIN: skin color, texture, turgor are normal, no rashes or significant lesions EYES: normal, Conjunctiva are pink and non-injected, sclera clear Musculoskeletal:no cyanosis of digits and no clubbing  NEURO: alert & oriented x 3 with fluent speech, no focal motor/sensory deficits  Physical Exam    LABORATORY DATA:  I have reviewed the data as listed    Latest Ref Rng & Units 09/09/2024    8:16 AM 06/09/2024    8:56 AM 02/17/2024    1:53 PM  CBC  WBC 4.0 - 10.5 K/uL 6.3  6.3  8.8   Hemoglobin 12.0 - 15.0 g/dL 86.4  86.4  87.2   Hematocrit 36.0 - 46.0 % 40.1  39.8  37.9   Platelets 150 - 400 K/uL 247  258  257         Latest Ref Rng & Units 09/09/2024    8:16 AM 07/28/2024   12:27 PM 06/09/2024    8:56 AM  CMP  Glucose 70 - 99 mg/dL 96  891  95   BUN 6 - 20 mg/dL 13  10  13    Creatinine 0.44 - 1.00 mg/dL 9.09  9.07  9.10   Sodium 135 - 145 mmol/L 138  138  139   Potassium 3.5 - 5.1 mmol/L 4.1  4.7  4.0   Chloride 98 - 111 mmol/L 101  102  105   CO2 22 - 32 mmol/L 26  29  28    Calcium 8.9 - 10.3 mg/dL 9.8  9.6  9.5   Total Protein 6.5 - 8.1 g/dL 8.2   8.0   Total Bilirubin 0.0 - 1.2 mg/dL 0.5   0.6   Alkaline Phos 38 - 126 U/L 81   72   AST 15 - 41 U/L 19   17   ALT 0 - 44 U/L 18   18       RADIOGRAPHIC STUDIES: I have personally reviewed the radiological images as listed and agreed with the findings in the report. No results found.    No orders of the defined types were placed in this encounter.  All questions were answered. The patient knows to call the clinic with any problems, questions or concerns. No barriers to learning was detected. The total time spent in the appointment was 30 minutes, including review of chart and various tests results, discussions about plan of care and coordination of care  plan     Onita Mattock, MD 09/09/2024     "

## 2024-09-15 ENCOUNTER — Encounter: Payer: Self-pay | Admitting: *Deleted

## 2024-09-16 ENCOUNTER — Other Ambulatory Visit: Payer: Self-pay | Admitting: General Surgery

## 2024-09-16 ENCOUNTER — Encounter: Payer: Self-pay | Admitting: Hematology

## 2024-09-16 DIAGNOSIS — D0512 Intraductal carcinoma in situ of left breast: Secondary | ICD-10-CM

## 2024-09-17 ENCOUNTER — Encounter: Payer: Self-pay | Admitting: Radiology

## 2024-09-29 ENCOUNTER — Ambulatory Visit (HOSPITAL_BASED_OUTPATIENT_CLINIC_OR_DEPARTMENT_OTHER)
Admission: RE | Admit: 2024-09-29 | Payer: PRIVATE HEALTH INSURANCE | Source: Home / Self Care | Admitting: General Surgery

## 2024-09-29 ENCOUNTER — Encounter (HOSPITAL_BASED_OUTPATIENT_CLINIC_OR_DEPARTMENT_OTHER): Admission: RE | Payer: Self-pay | Source: Home / Self Care

## 2024-10-05 ENCOUNTER — Inpatient Hospital Stay: Payer: PRIVATE HEALTH INSURANCE | Attending: Hematology

## 2024-10-09 ENCOUNTER — Ambulatory Visit: Admitting: Family Medicine

## 2024-10-12 ENCOUNTER — Ambulatory Visit: Admitting: Family Medicine
